# Patient Record
Sex: Female | Born: 1940 | Race: Black or African American | Hispanic: No | Smoking: Former smoker
Health system: Southern US, Community
[De-identification: ages and names within clinical notes are randomized; demographics above are authoritative.]

## PROBLEM LIST (undated history)

## (undated) DIAGNOSIS — J449 Chronic obstructive pulmonary disease, unspecified: Secondary | ICD-10-CM

## (undated) DIAGNOSIS — I639 Cerebral infarction, unspecified: Secondary | ICD-10-CM

## (undated) DIAGNOSIS — G4733 Obstructive sleep apnea (adult) (pediatric): Secondary | ICD-10-CM

## (undated) DIAGNOSIS — J9819 Other pulmonary collapse: Secondary | ICD-10-CM

## (undated) DIAGNOSIS — M1990 Unspecified osteoarthritis, unspecified site: Secondary | ICD-10-CM

## (undated) DIAGNOSIS — I4729 Other ventricular tachycardia: Secondary | ICD-10-CM

## (undated) DIAGNOSIS — E669 Obesity, unspecified: Secondary | ICD-10-CM

## (undated) DIAGNOSIS — E662 Morbid (severe) obesity with alveolar hypoventilation: Secondary | ICD-10-CM

## (undated) DIAGNOSIS — D649 Anemia, unspecified: Secondary | ICD-10-CM

## (undated) DIAGNOSIS — E785 Hyperlipidemia, unspecified: Secondary | ICD-10-CM

## (undated) DIAGNOSIS — I5042 Chronic combined systolic (congestive) and diastolic (congestive) heart failure: Secondary | ICD-10-CM

## (undated) DIAGNOSIS — J45909 Unspecified asthma, uncomplicated: Secondary | ICD-10-CM

## (undated) DIAGNOSIS — K219 Gastro-esophageal reflux disease without esophagitis: Secondary | ICD-10-CM

## (undated) DIAGNOSIS — I999 Unspecified disorder of circulatory system: Secondary | ICD-10-CM

## (undated) DIAGNOSIS — I469 Cardiac arrest, cause unspecified: Secondary | ICD-10-CM

## (undated) DIAGNOSIS — I4510 Unspecified right bundle-branch block: Secondary | ICD-10-CM

## (undated) DIAGNOSIS — D696 Thrombocytopenia, unspecified: Secondary | ICD-10-CM

## (undated) DIAGNOSIS — I472 Ventricular tachycardia: Secondary | ICD-10-CM

## (undated) DIAGNOSIS — K7460 Unspecified cirrhosis of liver: Secondary | ICD-10-CM

## (undated) DIAGNOSIS — J9620 Acute and chronic respiratory failure, unspecified whether with hypoxia or hypercapnia: Secondary | ICD-10-CM

## (undated) DIAGNOSIS — I214 Non-ST elevation (NSTEMI) myocardial infarction: Secondary | ICD-10-CM

## (undated) DIAGNOSIS — I272 Pulmonary hypertension, unspecified: Secondary | ICD-10-CM

## (undated) DIAGNOSIS — N184 Chronic kidney disease, stage 4 (severe): Secondary | ICD-10-CM

## (undated) DIAGNOSIS — I10 Essential (primary) hypertension: Secondary | ICD-10-CM

## (undated) DIAGNOSIS — M109 Gout, unspecified: Secondary | ICD-10-CM

## (undated) DIAGNOSIS — I110 Hypertensive heart disease with heart failure: Secondary | ICD-10-CM

## (undated) DIAGNOSIS — I279 Pulmonary heart disease, unspecified: Secondary | ICD-10-CM

## (undated) HISTORY — PX: ABDOMINAL HYSTERECTOMY: SHX81

## (undated) HISTORY — PX: CARDIAC CATHETERIZATION: SHX172

## (undated) HISTORY — PX: BILATERAL OOPHORECTOMY: SHX1221

## (undated) HISTORY — PX: OTHER SURGICAL HISTORY: SHX169

---

## 1999-10-06 ENCOUNTER — Ambulatory Visit (HOSPITAL_COMMUNITY): Admission: RE | Admit: 1999-10-06 | Discharge: 1999-10-06 | Payer: Self-pay | Admitting: Specialist

## 1999-10-06 ENCOUNTER — Encounter: Payer: Self-pay | Admitting: Specialist

## 1999-12-03 ENCOUNTER — Encounter: Admission: RE | Admit: 1999-12-03 | Discharge: 1999-12-03 | Payer: Self-pay | Admitting: Nephrology

## 1999-12-03 ENCOUNTER — Encounter: Payer: Self-pay | Admitting: Nephrology

## 1999-12-15 ENCOUNTER — Ambulatory Visit (HOSPITAL_COMMUNITY): Admission: RE | Admit: 1999-12-15 | Discharge: 1999-12-15 | Payer: Self-pay | Admitting: *Deleted

## 2000-01-22 ENCOUNTER — Other Ambulatory Visit: Admission: RE | Admit: 2000-01-22 | Discharge: 2000-01-22 | Payer: Self-pay | Admitting: Obstetrics and Gynecology

## 2000-02-03 ENCOUNTER — Encounter: Payer: Self-pay | Admitting: Obstetrics and Gynecology

## 2000-02-08 ENCOUNTER — Encounter: Payer: Self-pay | Admitting: Nephrology

## 2000-02-08 ENCOUNTER — Ambulatory Visit (HOSPITAL_COMMUNITY): Admission: RE | Admit: 2000-02-08 | Discharge: 2000-02-08 | Payer: Self-pay | Admitting: Nephrology

## 2002-12-29 ENCOUNTER — Emergency Department (HOSPITAL_COMMUNITY): Admission: EM | Admit: 2002-12-29 | Discharge: 2002-12-29 | Payer: Self-pay | Admitting: Emergency Medicine

## 2003-10-17 ENCOUNTER — Encounter: Admission: RE | Admit: 2003-10-17 | Discharge: 2003-10-17 | Payer: Self-pay | Admitting: Obstetrics and Gynecology

## 2004-01-20 ENCOUNTER — Encounter: Admission: RE | Admit: 2004-01-20 | Discharge: 2004-01-20 | Payer: Self-pay | Admitting: Cardiology

## 2004-02-27 ENCOUNTER — Encounter: Admission: RE | Admit: 2004-02-27 | Discharge: 2004-05-27 | Payer: Self-pay | Admitting: Cardiology

## 2004-03-16 ENCOUNTER — Encounter (INDEPENDENT_AMBULATORY_CARE_PROVIDER_SITE_OTHER): Payer: Self-pay | Admitting: *Deleted

## 2004-03-16 ENCOUNTER — Ambulatory Visit (HOSPITAL_COMMUNITY): Admission: RE | Admit: 2004-03-16 | Discharge: 2004-03-16 | Payer: Self-pay | Admitting: Obstetrics and Gynecology

## 2004-03-30 ENCOUNTER — Ambulatory Visit: Payer: Self-pay | Admitting: Family Medicine

## 2004-04-09 ENCOUNTER — Ambulatory Visit: Payer: Self-pay | Admitting: Family Medicine

## 2004-04-23 ENCOUNTER — Ambulatory Visit: Payer: Self-pay | Admitting: Cardiology

## 2005-01-11 ENCOUNTER — Ambulatory Visit: Payer: Self-pay | Admitting: Family Medicine

## 2005-01-26 ENCOUNTER — Ambulatory Visit: Payer: Self-pay | Admitting: Cardiology

## 2005-08-13 ENCOUNTER — Ambulatory Visit: Payer: Self-pay | Admitting: Family Medicine

## 2005-08-30 ENCOUNTER — Emergency Department (HOSPITAL_COMMUNITY): Admission: EM | Admit: 2005-08-30 | Discharge: 2005-08-30 | Payer: Self-pay | Admitting: Emergency Medicine

## 2005-09-10 ENCOUNTER — Emergency Department (HOSPITAL_COMMUNITY): Admission: EM | Admit: 2005-09-10 | Discharge: 2005-09-10 | Payer: Self-pay | Admitting: Family Medicine

## 2005-11-12 ENCOUNTER — Ambulatory Visit (HOSPITAL_COMMUNITY): Admission: RE | Admit: 2005-11-12 | Discharge: 2005-11-12 | Payer: Self-pay | Admitting: Ophthalmology

## 2006-01-21 ENCOUNTER — Ambulatory Visit (HOSPITAL_COMMUNITY): Admission: RE | Admit: 2006-01-21 | Discharge: 2006-01-21 | Payer: Self-pay | Admitting: Ophthalmology

## 2006-05-16 ENCOUNTER — Inpatient Hospital Stay (HOSPITAL_COMMUNITY): Admission: EM | Admit: 2006-05-16 | Discharge: 2006-05-22 | Payer: Self-pay | Admitting: Family Medicine

## 2006-08-15 ENCOUNTER — Encounter: Admission: RE | Admit: 2006-08-15 | Discharge: 2006-08-15 | Payer: Self-pay | Admitting: Nephrology

## 2006-11-21 ENCOUNTER — Ambulatory Visit (HOSPITAL_COMMUNITY): Admission: RE | Admit: 2006-11-21 | Discharge: 2006-11-21 | Payer: Self-pay | Admitting: Ophthalmology

## 2006-11-21 ENCOUNTER — Encounter (INDEPENDENT_AMBULATORY_CARE_PROVIDER_SITE_OTHER): Payer: Self-pay | Admitting: Anesthesiology

## 2007-04-14 ENCOUNTER — Ambulatory Visit (HOSPITAL_COMMUNITY): Admission: RE | Admit: 2007-04-14 | Discharge: 2007-04-14 | Payer: Self-pay | Admitting: Ophthalmology

## 2007-04-14 ENCOUNTER — Encounter (INDEPENDENT_AMBULATORY_CARE_PROVIDER_SITE_OTHER): Payer: Self-pay | Admitting: Ophthalmology

## 2008-04-17 ENCOUNTER — Ambulatory Visit (HOSPITAL_COMMUNITY): Admission: RE | Admit: 2008-04-17 | Discharge: 2008-04-17 | Payer: Self-pay | Admitting: Ophthalmology

## 2008-05-01 ENCOUNTER — Inpatient Hospital Stay (HOSPITAL_COMMUNITY): Admission: EM | Admit: 2008-05-01 | Discharge: 2008-05-05 | Payer: Self-pay | Admitting: Emergency Medicine

## 2008-05-02 ENCOUNTER — Encounter (INDEPENDENT_AMBULATORY_CARE_PROVIDER_SITE_OTHER): Payer: Self-pay | Admitting: Internal Medicine

## 2008-05-02 ENCOUNTER — Ambulatory Visit: Payer: Self-pay | Admitting: Vascular Surgery

## 2008-06-11 ENCOUNTER — Encounter: Payer: Self-pay | Admitting: Pulmonary Disease

## 2008-06-11 ENCOUNTER — Ambulatory Visit (HOSPITAL_BASED_OUTPATIENT_CLINIC_OR_DEPARTMENT_OTHER): Admission: RE | Admit: 2008-06-11 | Discharge: 2008-06-11 | Payer: Self-pay | Admitting: Pulmonary Disease

## 2008-06-13 ENCOUNTER — Ambulatory Visit: Payer: Self-pay | Admitting: Pulmonary Disease

## 2008-06-18 DIAGNOSIS — J45909 Unspecified asthma, uncomplicated: Secondary | ICD-10-CM | POA: Insufficient documentation

## 2008-06-18 DIAGNOSIS — G4733 Obstructive sleep apnea (adult) (pediatric): Secondary | ICD-10-CM | POA: Insufficient documentation

## 2008-06-18 DIAGNOSIS — J4489 Other specified chronic obstructive pulmonary disease: Secondary | ICD-10-CM | POA: Insufficient documentation

## 2008-06-18 DIAGNOSIS — Z8679 Personal history of other diseases of the circulatory system: Secondary | ICD-10-CM | POA: Insufficient documentation

## 2008-06-18 DIAGNOSIS — I110 Hypertensive heart disease with heart failure: Secondary | ICD-10-CM

## 2008-06-18 DIAGNOSIS — E8881 Metabolic syndrome: Secondary | ICD-10-CM

## 2008-06-18 DIAGNOSIS — J449 Chronic obstructive pulmonary disease, unspecified: Secondary | ICD-10-CM | POA: Insufficient documentation

## 2008-06-18 DIAGNOSIS — E1159 Type 2 diabetes mellitus with other circulatory complications: Secondary | ICD-10-CM

## 2008-06-18 DIAGNOSIS — F17200 Nicotine dependence, unspecified, uncomplicated: Secondary | ICD-10-CM

## 2008-06-18 DIAGNOSIS — E669 Obesity, unspecified: Secondary | ICD-10-CM | POA: Insufficient documentation

## 2008-06-18 DIAGNOSIS — E785 Hyperlipidemia, unspecified: Secondary | ICD-10-CM

## 2008-06-18 DIAGNOSIS — I279 Pulmonary heart disease, unspecified: Secondary | ICD-10-CM | POA: Insufficient documentation

## 2008-06-18 DIAGNOSIS — I2510 Atherosclerotic heart disease of native coronary artery without angina pectoris: Secondary | ICD-10-CM | POA: Insufficient documentation

## 2008-06-18 DIAGNOSIS — N184 Chronic kidney disease, stage 4 (severe): Secondary | ICD-10-CM | POA: Insufficient documentation

## 2008-07-15 ENCOUNTER — Encounter (INDEPENDENT_AMBULATORY_CARE_PROVIDER_SITE_OTHER): Payer: Self-pay | Admitting: *Deleted

## 2008-07-16 ENCOUNTER — Emergency Department (HOSPITAL_COMMUNITY): Admission: EM | Admit: 2008-07-16 | Discharge: 2008-07-17 | Payer: Self-pay | Admitting: Emergency Medicine

## 2008-08-07 ENCOUNTER — Inpatient Hospital Stay (HOSPITAL_COMMUNITY): Admission: EM | Admit: 2008-08-07 | Discharge: 2008-08-18 | Payer: Self-pay | Admitting: Emergency Medicine

## 2008-08-08 ENCOUNTER — Ambulatory Visit: Payer: Self-pay | Admitting: Vascular Surgery

## 2008-08-08 ENCOUNTER — Encounter (INDEPENDENT_AMBULATORY_CARE_PROVIDER_SITE_OTHER): Payer: Self-pay | Admitting: Internal Medicine

## 2010-03-16 ENCOUNTER — Encounter: Payer: Self-pay | Admitting: Endocrinology

## 2010-06-01 LAB — BLOOD GAS, ARTERIAL
Acid-Base Excess: 4.8 mmol/L — ABNORMAL HIGH (ref 0.0–2.0)
Acid-Base Excess: 6.2 mmol/L — ABNORMAL HIGH (ref 0.0–2.0)
Acid-Base Excess: 6.4 mmol/L — ABNORMAL HIGH (ref 0.0–2.0)
Bicarbonate: 32.4 mEq/L — ABNORMAL HIGH (ref 20.0–24.0)
Bicarbonate: 32.5 mEq/L — ABNORMAL HIGH (ref 20.0–24.0)
Delivery systems: POSITIVE
Expiratory PAP: 5
FIO2: 0.4 %
FIO2: 40 %
Inspiratory PAP: 15
O2 Saturation: 93.4 %
O2 Saturation: 93.8 %
O2 Saturation: 97.1 %
RATE: 8 resp/min
TCO2: 34.3 mmol/L (ref 0–100)
TCO2: 35 mmol/L (ref 0–100)
TCO2: 35 mmol/L (ref 0–100)
pCO2 arterial: 65.3 mmHg (ref 35.0–45.0)
pCO2 arterial: 73.1 mmHg (ref 35.0–45.0)
pCO2 arterial: 85.1 mmHg (ref 35.0–45.0)
pH, Arterial: 7.232 — ABNORMAL LOW (ref 7.350–7.400)
pO2, Arterial: 62.8 mmHg — ABNORMAL LOW (ref 80.0–100.0)
pO2, Arterial: 71.9 mmHg — ABNORMAL LOW (ref 80.0–100.0)
pO2, Arterial: 83 mmHg (ref 80.0–100.0)

## 2010-06-01 LAB — CLOSTRIDIUM DIFFICILE EIA: C difficile Toxins A+B, EIA: NEGATIVE

## 2010-06-01 LAB — BASIC METABOLIC PANEL
BUN: 14 mg/dL (ref 6–23)
BUN: 18 mg/dL (ref 6–23)
CO2: 28 mEq/L (ref 19–32)
CO2: 31 mEq/L (ref 19–32)
CO2: 32 mEq/L (ref 19–32)
CO2: 32 mEq/L (ref 19–32)
CO2: 33 mEq/L — ABNORMAL HIGH (ref 19–32)
CO2: 37 mEq/L — ABNORMAL HIGH (ref 19–32)
CO2: 37 mEq/L — ABNORMAL HIGH (ref 19–32)
Calcium: 7.5 mg/dL — ABNORMAL LOW (ref 8.4–10.5)
Calcium: 7.6 mg/dL — ABNORMAL LOW (ref 8.4–10.5)
Calcium: 8.6 mg/dL (ref 8.4–10.5)
Calcium: 8.7 mg/dL (ref 8.4–10.5)
Calcium: 8.7 mg/dL (ref 8.4–10.5)
Calcium: 8.7 mg/dL (ref 8.4–10.5)
Chloride: 100 mEq/L (ref 96–112)
Chloride: 104 mEq/L (ref 96–112)
Chloride: 98 mEq/L (ref 96–112)
Chloride: 99 mEq/L (ref 96–112)
Chloride: 99 mEq/L (ref 96–112)
Creatinine, Ser: 1.16 mg/dL (ref 0.4–1.2)
Creatinine, Ser: 1.41 mg/dL — ABNORMAL HIGH (ref 0.4–1.2)
Creatinine, Ser: 1.54 mg/dL — ABNORMAL HIGH (ref 0.4–1.2)
Creatinine, Ser: 1.56 mg/dL — ABNORMAL HIGH (ref 0.4–1.2)
Creatinine, Ser: 1.76 mg/dL — ABNORMAL HIGH (ref 0.4–1.2)
Creatinine, Ser: 1.93 mg/dL — ABNORMAL HIGH (ref 0.4–1.2)
GFR calc Af Amer: 31 mL/min — ABNORMAL LOW (ref >60)
GFR calc Af Amer: 35 mL/min — ABNORMAL LOW (ref >60)
GFR calc Af Amer: 38 mL/min — ABNORMAL LOW (ref >60)
GFR calc Af Amer: 40 mL/min — ABNORMAL LOW (ref >60)
GFR calc Af Amer: 50 mL/min — ABNORMAL LOW (ref >60)
GFR calc Af Amer: 56 mL/min — ABNORMAL LOW (ref >60)
GFR calc non Af Amer: 26 mL/min — ABNORMAL LOW (ref >60)
GFR calc non Af Amer: 29 mL/min — ABNORMAL LOW (ref >60)
GFR calc non Af Amer: 43 mL/min — ABNORMAL LOW (ref >60)
Glucose, Bld: 102 mg/dL — ABNORMAL HIGH (ref 70–99)
Glucose, Bld: 104 mg/dL — ABNORMAL HIGH (ref 70–99)
Glucose, Bld: 113 mg/dL — ABNORMAL HIGH (ref 70–99)
Glucose, Bld: 118 mg/dL — ABNORMAL HIGH (ref 70–99)
Glucose, Bld: 123 mg/dL — ABNORMAL HIGH (ref 70–99)
Glucose, Bld: 87 mg/dL (ref 70–99)
Potassium: 4.9 mEq/L (ref 3.5–5.1)
Potassium: 5.1 mEq/L (ref 3.5–5.1)
Potassium: 5.3 mEq/L — ABNORMAL HIGH (ref 3.5–5.1)
Potassium: 6.1 mEq/L — ABNORMAL HIGH (ref 3.5–5.1)
Sodium: 135 mEq/L (ref 135–145)
Sodium: 135 mEq/L (ref 135–145)
Sodium: 140 mEq/L (ref 135–145)
Sodium: 141 mEq/L (ref 135–145)
Sodium: 141 mEq/L (ref 135–145)

## 2010-06-01 LAB — HEPATIC FUNCTION PANEL
ALT: 11 U/L (ref 0–35)
AST: 13 U/L (ref 0–37)
Albumin: 2.3 g/dL — ABNORMAL LOW (ref 3.5–5.2)
Alkaline Phosphatase: 76 U/L (ref 39–117)
Bilirubin, Direct: 0.3 mg/dL (ref 0.0–0.3)
Total Bilirubin: 0.7 mg/dL (ref 0.3–1.2)
Total Bilirubin: 0.9 mg/dL (ref 0.3–1.2)

## 2010-06-01 LAB — COMPREHENSIVE METABOLIC PANEL
AST: 13 U/L (ref 0–37)
Albumin: 2.4 g/dL — ABNORMAL LOW (ref 3.5–5.2)
Alkaline Phosphatase: 65 U/L (ref 39–117)
Chloride: 103 mEq/L (ref 96–112)
GFR calc Af Amer: 35 mL/min — ABNORMAL LOW (ref >60)
Potassium: 3.4 mEq/L — ABNORMAL LOW (ref 3.5–5.1)
Sodium: 142 mEq/L (ref 135–145)
Total Bilirubin: 0.7 mg/dL (ref 0.3–1.2)

## 2010-06-01 LAB — CBC
HCT: 37.3 % (ref 36.0–46.0)
HCT: 39.6 % (ref 36.0–46.0)
HCT: 43.3 % (ref 36.0–46.0)
HCT: 44.5 % (ref 36.0–46.0)
HCT: 54 % — ABNORMAL HIGH (ref 36.0–46.0)
Hemoglobin: 10.8 g/dL — ABNORMAL LOW (ref 12.0–15.0)
Hemoglobin: 11.5 g/dL — ABNORMAL LOW (ref 12.0–15.0)
Hemoglobin: 11.5 g/dL — ABNORMAL LOW (ref 12.0–15.0)
Hemoglobin: 12.1 g/dL (ref 12.0–15.0)
Hemoglobin: 12.4 g/dL (ref 12.0–15.0)
Hemoglobin: 12.9 g/dL (ref 12.0–15.0)
Hemoglobin: 13 g/dL (ref 12.0–15.0)
Hemoglobin: 15.8 g/dL — ABNORMAL HIGH (ref 12.0–15.0)
MCHC: 28.3 g/dL — ABNORMAL LOW (ref 30.0–36.0)
MCHC: 29.1 g/dL — ABNORMAL LOW (ref 30.0–36.0)
MCHC: 29.3 g/dL — ABNORMAL LOW (ref 30.0–36.0)
MCHC: 29.6 g/dL — ABNORMAL LOW (ref 30.0–36.0)
MCV: 75.6 fL — ABNORMAL LOW (ref 78.0–100.0)
MCV: 75.7 fL — ABNORMAL LOW (ref 78.0–100.0)
MCV: 75.7 fL — ABNORMAL LOW (ref 78.0–100.0)
MCV: 76.6 fL — ABNORMAL LOW (ref 78.0–100.0)
Platelets: 164 10*3/uL (ref 150–400)
Platelets: 206 10*3/uL (ref 150–400)
Platelets: 215 10*3/uL (ref 150–400)
Platelets: 270 10*3/uL (ref 150–400)
RBC: 4.94 MIL/uL (ref 3.87–5.11)
RBC: 5.49 MIL/uL — ABNORMAL HIGH (ref 3.87–5.11)
RBC: 5.73 MIL/uL — ABNORMAL HIGH (ref 3.87–5.11)
RBC: 5.8 MIL/uL — ABNORMAL HIGH (ref 3.87–5.11)
RBC: 5.92 MIL/uL — ABNORMAL HIGH (ref 3.87–5.11)
RDW: 27 % — ABNORMAL HIGH (ref 11.5–15.5)
RDW: 27.7 % — ABNORMAL HIGH (ref 11.5–15.5)
RDW: 28 % — ABNORMAL HIGH (ref 11.5–15.5)
RDW: 28 % — ABNORMAL HIGH (ref 11.5–15.5)
RDW: 28.7 % — ABNORMAL HIGH (ref 11.5–15.5)
WBC: 11.2 10*3/uL — ABNORMAL HIGH (ref 4.0–10.5)
WBC: 11.4 10*3/uL — ABNORMAL HIGH (ref 4.0–10.5)
WBC: 8.8 10*3/uL (ref 4.0–10.5)
WBC: 8.8 10*3/uL (ref 4.0–10.5)
WBC: 9.4 10*3/uL (ref 4.0–10.5)

## 2010-06-01 LAB — URINE MICROSCOPIC-ADD ON

## 2010-06-01 LAB — POCT I-STAT 3, ART BLOOD GAS (G3+)
Acid-Base Excess: 6 mmol/L — ABNORMAL HIGH (ref 0.0–2.0)
Bicarbonate: 32.3 mEq/L — ABNORMAL HIGH (ref 20.0–24.0)
TCO2: 34 mmol/L (ref 0–100)
pO2, Arterial: 76 mmHg — ABNORMAL LOW (ref 80.0–100.0)

## 2010-06-01 LAB — CARDIAC PANEL(CRET KIN+CKTOT+MB+TROPI)
CK, MB: 1.6 ng/mL (ref 0.3–4.0)
Relative Index: INVALID (ref 0.0–2.5)
Relative Index: INVALID (ref 0.0–2.5)
Total CK: 36 U/L (ref 7–177)
Total CK: 58 U/L (ref 7–177)
Troponin I: 0.05 ng/mL (ref 0.00–0.06)
Troponin I: 0.07 ng/mL — ABNORMAL HIGH (ref 0.00–0.06)

## 2010-06-01 LAB — LIPID PANEL
Cholesterol: 151 mg/dL (ref 0–200)
Total CHOL/HDL Ratio: 4.4 RATIO

## 2010-06-01 LAB — URINALYSIS, ROUTINE W REFLEX MICROSCOPIC
Glucose, UA: NEGATIVE mg/dL
Ketones, ur: 15 mg/dL — AB
pH: 6 (ref 5.0–8.0)

## 2010-06-01 LAB — GLUCOSE, CAPILLARY
Glucose-Capillary: 101 mg/dL — ABNORMAL HIGH (ref 70–99)
Glucose-Capillary: 107 mg/dL — ABNORMAL HIGH (ref 70–99)
Glucose-Capillary: 109 mg/dL — ABNORMAL HIGH (ref 70–99)
Glucose-Capillary: 109 mg/dL — ABNORMAL HIGH (ref 70–99)
Glucose-Capillary: 109 mg/dL — ABNORMAL HIGH (ref 70–99)
Glucose-Capillary: 109 mg/dL — ABNORMAL HIGH (ref 70–99)
Glucose-Capillary: 115 mg/dL — ABNORMAL HIGH (ref 70–99)
Glucose-Capillary: 129 mg/dL — ABNORMAL HIGH (ref 70–99)
Glucose-Capillary: 132 mg/dL — ABNORMAL HIGH (ref 70–99)
Glucose-Capillary: 133 mg/dL — ABNORMAL HIGH (ref 70–99)
Glucose-Capillary: 142 mg/dL — ABNORMAL HIGH (ref 70–99)
Glucose-Capillary: 144 mg/dL — ABNORMAL HIGH (ref 70–99)
Glucose-Capillary: 145 mg/dL — ABNORMAL HIGH (ref 70–99)
Glucose-Capillary: 147 mg/dL — ABNORMAL HIGH (ref 70–99)
Glucose-Capillary: 159 mg/dL — ABNORMAL HIGH (ref 70–99)
Glucose-Capillary: 160 mg/dL — ABNORMAL HIGH (ref 70–99)
Glucose-Capillary: 178 mg/dL — ABNORMAL HIGH (ref 70–99)
Glucose-Capillary: 21 mg/dL — CL (ref 70–99)
Glucose-Capillary: 29 mg/dL — CL (ref 70–99)
Glucose-Capillary: 71 mg/dL (ref 70–99)
Glucose-Capillary: 80 mg/dL (ref 70–99)
Glucose-Capillary: 84 mg/dL (ref 70–99)
Glucose-Capillary: 85 mg/dL (ref 70–99)
Glucose-Capillary: 86 mg/dL (ref 70–99)
Glucose-Capillary: 89 mg/dL (ref 70–99)
Glucose-Capillary: 90 mg/dL (ref 70–99)
Glucose-Capillary: 91 mg/dL (ref 70–99)
Glucose-Capillary: 93 mg/dL (ref 70–99)
Glucose-Capillary: 97 mg/dL (ref 70–99)
Glucose-Capillary: 97 mg/dL (ref 70–99)
Glucose-Capillary: <10 mg/dL — CL (ref 70–99)

## 2010-06-01 LAB — PROTIME-INR: Prothrombin Time: 13.9 seconds (ref 11.6–15.2)

## 2010-06-01 LAB — CULTURE, BLOOD (ROUTINE X 2): Culture: NO GROWTH

## 2010-06-01 LAB — CK TOTAL AND CKMB (NOT AT ARMC)
CK, MB: 2.9 ng/mL (ref 0.3–4.0)
Relative Index: INVALID (ref 0.0–2.5)

## 2010-06-01 LAB — DIFFERENTIAL
Basophils Absolute: 0 10*3/uL (ref 0.0–0.1)
Eosinophils Absolute: 0 10*3/uL (ref 0.0–0.7)
Eosinophils Relative: 0 % (ref 0–5)
Lymphocytes Relative: 9 % — ABNORMAL LOW (ref 12–46)
Lymphs Abs: 1 10*3/uL (ref 0.7–4.0)
Monocytes Relative: 5 % (ref 3–12)
Neutrophils Relative %: 86 % — ABNORMAL HIGH (ref 43–77)

## 2010-06-01 LAB — BRAIN NATRIURETIC PEPTIDE: Pro B Natriuretic peptide (BNP): 737 pg/mL — ABNORMAL HIGH (ref 0.0–100.0)

## 2010-06-01 LAB — MAGNESIUM
Magnesium: 1.3 mg/dL — ABNORMAL LOW (ref 1.5–2.5)
Magnesium: 1.8 mg/dL (ref 1.5–2.5)

## 2010-06-01 LAB — POCT I-STAT, CHEM 8
Creatinine, Ser: 1.3 mg/dL — ABNORMAL HIGH (ref 0.4–1.2)
HCT: 62 % — ABNORMAL HIGH (ref 36.0–46.0)
Hemoglobin: 21.1 g/dL — ABNORMAL HIGH (ref 12.0–15.0)
Sodium: 139 mEq/L (ref 135–145)
TCO2: 34 mmol/L (ref 0–100)

## 2010-06-01 LAB — POCT CARDIAC MARKERS

## 2010-06-01 LAB — HEMOGLOBIN A1C

## 2010-06-02 LAB — DIFFERENTIAL
Basophils Relative: 0 % (ref 0–1)
Eosinophils Absolute: 0.1 10*3/uL (ref 0.0–0.7)
Eosinophils Relative: 1 % (ref 0–5)
Lymphocytes Relative: 21 % (ref 12–46)
Monocytes Relative: 7 % (ref 3–12)
Neutro Abs: 4.9 10*3/uL (ref 1.7–7.7)
Neutrophils Relative %: 71 % (ref 43–77)

## 2010-06-02 LAB — POCT CARDIAC MARKERS
CKMB, poc: <1 ng/mL — ABNORMAL LOW (ref 1.0–8.0)
Myoglobin, poc: 138 ng/mL (ref 12–200)

## 2010-06-02 LAB — CBC
HCT: 42.9 % (ref 36.0–46.0)
MCV: 77.3 fL — ABNORMAL LOW (ref 78.0–100.0)
Platelets: 246 10*3/uL (ref 150–400)
RDW: 26.3 % — ABNORMAL HIGH (ref 11.5–15.5)
WBC: 7 10*3/uL (ref 4.0–10.5)

## 2010-06-02 LAB — COMPREHENSIVE METABOLIC PANEL
AST: 15 U/L (ref 0–37)
Albumin: 2.5 g/dL — ABNORMAL LOW (ref 3.5–5.2)
BUN: 11 mg/dL (ref 6–23)
Calcium: 8.5 mg/dL (ref 8.4–10.5)
Creatinine, Ser: 1.47 mg/dL — ABNORMAL HIGH (ref 0.4–1.2)
GFR calc Af Amer: 43 mL/min — ABNORMAL LOW (ref >60)
Total Bilirubin: 0.8 mg/dL (ref 0.3–1.2)
Total Protein: 5.8 g/dL — ABNORMAL LOW (ref 6.0–8.3)

## 2010-06-04 LAB — POCT I-STAT, CHEM 8
Calcium, Ion: 1.18 mmol/L (ref 1.12–1.32)
Creatinine, Ser: 1.9 mg/dL — ABNORMAL HIGH (ref 0.4–1.2)
Glucose, Bld: 100 mg/dL — ABNORMAL HIGH (ref 70–99)
Hemoglobin: 17 g/dL — ABNORMAL HIGH (ref 12.0–15.0)
Potassium: 4.8 mEq/L (ref 3.5–5.1)

## 2010-06-04 LAB — GLUCOSE, CAPILLARY
Glucose-Capillary: 119 mg/dL — ABNORMAL HIGH (ref 70–99)
Glucose-Capillary: 127 mg/dL — ABNORMAL HIGH (ref 70–99)
Glucose-Capillary: 136 mg/dL — ABNORMAL HIGH (ref 70–99)
Glucose-Capillary: 162 mg/dL — ABNORMAL HIGH (ref 70–99)
Glucose-Capillary: 80 mg/dL (ref 70–99)

## 2010-06-04 LAB — BASIC METABOLIC PANEL
BUN: 27 mg/dL — ABNORMAL HIGH (ref 6–23)
BUN: 27 mg/dL — ABNORMAL HIGH (ref 6–23)
CO2: 27 mEq/L (ref 19–32)
CO2: 32 mEq/L (ref 19–32)
Calcium: 9 mg/dL (ref 8.4–10.5)
Calcium: 9.1 mg/dL (ref 8.4–10.5)
Calcium: 9.4 mg/dL (ref 8.4–10.5)
Calcium: 9.5 mg/dL (ref 8.4–10.5)
Creatinine, Ser: 1.46 mg/dL — ABNORMAL HIGH (ref 0.4–1.2)
Creatinine, Ser: 1.78 mg/dL — ABNORMAL HIGH (ref 0.4–1.2)
GFR calc Af Amer: 34 mL/min — ABNORMAL LOW (ref >60)
GFR calc Af Amer: 51 mL/min — ABNORMAL LOW (ref >60)
GFR calc non Af Amer: 28 mL/min — ABNORMAL LOW (ref >60)
GFR calc non Af Amer: 36 mL/min — ABNORMAL LOW (ref >60)
GFR calc non Af Amer: 36 mL/min — ABNORMAL LOW (ref >60)
GFR calc non Af Amer: 42 mL/min — ABNORMAL LOW (ref >60)
Glucose, Bld: 100 mg/dL — ABNORMAL HIGH (ref 70–99)
Glucose, Bld: 81 mg/dL (ref 70–99)
Potassium: 5.1 mEq/L (ref 3.5–5.1)
Sodium: 138 mEq/L (ref 135–145)
Sodium: 138 mEq/L (ref 135–145)

## 2010-06-04 LAB — PHOSPHORUS: Phosphorus: 5 mg/dL — ABNORMAL HIGH (ref 2.3–4.6)

## 2010-06-04 LAB — DIFFERENTIAL
Basophils Absolute: 0 10*3/uL (ref 0.0–0.1)
Basophils Relative: 0 % (ref 0–1)
Eosinophils Absolute: 0 10*3/uL (ref 0.0–0.7)
Eosinophils Relative: 0 % (ref 0–5)
Lymphs Abs: 1.6 10*3/uL (ref 0.7–4.0)
Neutrophils Relative %: 77 % (ref 43–77)

## 2010-06-04 LAB — TROPONIN I: Troponin I: <0.01 ng/mL (ref 0.00–0.06)

## 2010-06-04 LAB — BLOOD GAS, ARTERIAL
Bicarbonate: 28.1 mEq/L — ABNORMAL HIGH (ref 20.0–24.0)
FIO2: 0.21 %
O2 Content: 2.5 L/min
O2 Saturation: 92.1 %
Patient temperature: 98.6
TCO2: 29.8 mmol/L (ref 0–100)
pCO2 arterial: 54.9 mmHg — ABNORMAL HIGH (ref 35.0–45.0)
pH, Arterial: 7.245 — ABNORMAL LOW (ref 7.350–7.400)
pH, Arterial: 7.329 — ABNORMAL LOW (ref 7.350–7.400)
pO2, Arterial: 55.3 mmHg — ABNORMAL LOW (ref 80.0–100.0)

## 2010-06-04 LAB — CBC
HCT: 41.7 % (ref 36.0–46.0)
HCT: 45 % (ref 36.0–46.0)
HCT: 45.5 % (ref 36.0–46.0)
Hemoglobin: 13.6 g/dL (ref 12.0–15.0)
Hemoglobin: 14.5 g/dL (ref 12.0–15.0)
MCHC: 32.7 g/dL (ref 30.0–36.0)
Platelets: 193 10*3/uL (ref 150–400)
Platelets: 210 10*3/uL (ref 150–400)
RDW: 19.6 % — ABNORMAL HIGH (ref 11.5–15.5)
RDW: 19.6 % — ABNORMAL HIGH (ref 11.5–15.5)
WBC: 10.3 10*3/uL (ref 4.0–10.5)
WBC: 9.5 10*3/uL (ref 4.0–10.5)

## 2010-06-04 LAB — CARDIAC PANEL(CRET KIN+CKTOT+MB+TROPI)
CK, MB: 3.3 ng/mL (ref 0.3–4.0)
Total CK: 203 U/L — ABNORMAL HIGH (ref 7–177)

## 2010-06-04 LAB — POCT CARDIAC MARKERS
CKMB, poc: 1.4 ng/mL (ref 1.0–8.0)
CKMB, poc: 1.8 ng/mL (ref 1.0–8.0)
Troponin i, poc: <0.05 ng/mL (ref 0.00–0.09)
Troponin i, poc: <0.05 ng/mL (ref 0.00–0.09)

## 2010-06-04 LAB — LIPID PANEL
LDL Cholesterol: 110 mg/dL — ABNORMAL HIGH (ref 0–99)
Total CHOL/HDL Ratio: 6.2 RATIO
Triglycerides: 148 mg/dL (ref <150)
VLDL: 30 mg/dL (ref 0–40)

## 2010-06-04 LAB — TSH: TSH: 1.505 u[IU]/mL (ref 0.350–4.500)

## 2010-06-04 LAB — CK TOTAL AND CKMB (NOT AT ARMC)
CK, MB: 2.9 ng/mL (ref 0.3–4.0)
CK, MB: 3.1 ng/mL (ref 0.3–4.0)
Relative Index: 1.6 (ref 0.0–2.5)
Relative Index: 1.7 (ref 0.0–2.5)
Total CK: 184 U/L — ABNORMAL HIGH (ref 7–177)

## 2010-06-04 LAB — APTT: aPTT: 29 seconds (ref 24–37)

## 2010-06-04 LAB — HEMOGLOBIN A1C

## 2010-06-04 LAB — PROTIME-INR: Prothrombin Time: 12.8 seconds (ref 11.6–15.2)

## 2010-06-04 LAB — HEPATIC FUNCTION PANEL
Bilirubin, Direct: 0.1 mg/dL (ref 0.0–0.3)
Indirect Bilirubin: 0.3 mg/dL (ref 0.3–0.9)
Total Protein: 6.2 g/dL (ref 6.0–8.3)

## 2010-06-04 LAB — D-DIMER, QUANTITATIVE: D-Dimer, Quant: 0.89 ug/mL-FEU — ABNORMAL HIGH (ref 0.00–0.48)

## 2010-07-07 NOTE — Discharge Summary (Signed)
NAMEMEIGAN, PATES NO.:  [id number]   MEDICAL RECORD NO.:  [id number]          PATIENT TYPE:  INP   LOCATION:  5528                         FACILITY:  [facility]   PHYSICIAN:  Beckey Rutter, MD  DATE OF BIRTH:  [birth year]   DATE OF ADMISSION:  08/07/2008  DATE OF DISCHARGE:  08/18/2008                               DISCHARGE SUMMARY   DISCHARGE DIAGNOSIS:  Please refer to the previously dictated discharge  summary for discharge diagnosis dictated under actual diagnosis.   DISCHARGE MEDICATIONS:  1. Albuterol MDI.  2. Allopurinol 100 mg p.o. daily.  3. Aspirin 325 mg.  4. Os-Cal 1 tablet b.i.d.  5. Tiazac 180 mg p.o. daily.  6. Lasix 40 mg p.o. daily.  7. Isosorbide mononitrate 30 mg p.o. daily.  8. Labetalol 200 mg p.o. b.i.d.  9. Zocor 20 mg p.o. nightly.  10.CPAP at night or during sleep.  11.K-Dur 10 mEq p.o. daily.  12.NovoLog insulin sliding scale 3 times before meals without meal      coverage with a sensitive scale.  13.Actos 30 mg p.o. daily.   DISCHARGE PLAN:  The patient is approaching stability at this time.  She  will be discharged home with home health PT and visiting nurse.  The  patient should follow up with her primary physician as discussed with  her.  The discharge plan and follow ups is also discussed with the  daughter.  The patient provided the number for Dr. Vassie Loll with [facility] and she should follow up with Dr. Allyne Gee as discussed to her.  The patient will wait for home health PT and RN set up.  She is  approaching stability and she will be discharged on August 19, 2008.      Beckey Rutter, MD  Electronically Signed     EME/MEDQ  D:  08/18/2008  T:  08/19/2008  Job:  [id number]

## 2010-07-07 NOTE — Group Therapy Note (Signed)
NAMECRISTOL, [name] NO.:  [id number]   MEDICAL RECORD NO.:  [id number]          PATIENT TYPE:  INP   LOCATION:  3103                         FACILITY:  [facility]   PHYSICIAN:  Lonia Blood, M.D.DATE OF BIRTH:  02-26-1940                                 PROGRESS NOTE   PRIMARY CARE PHYSICIAN:  Dr. Candyce Churn. Sander.   ACTIVE DIAGNOSES:  1. Severe idiopathic refractory hypoglycemia.  2. Acute on chronic respiratory failure secondary to sedation -      presently requiring bilevel positive airway pressure.  3. Obesity hypoventilation syndrome/sleep apnea - poorly compliant      with the continuous positive airway pressure at home.  4. Chronic peripheral edema secondary to diastolic heart failure, cor      pulmonale and low albumin/low oncotic pressure.  5. Pulmonary hypertension.  6. Obesity hypoventilation/sleep apnea.  7. Morbid obesity.  8. Hypertension.  9. Stage III chronic kidney disease - baseline creatinine      approximately 1.5-1.8.  10.Ongoing tobacco abuse.  11.Hyperlipidemia.  12.Status post bilateral oophorectomies.  13.Diarrhea with abdominal pain and cramps - resolved.   DISCHARGE MEDICATIONS:  To be determined at the actual time of discharge.   PROCEDURES:  1. CT angio of the chest on August 07, 2008 - no evidence for acute      pulmonary emboli.  Cardiomegaly is evident.  2. CT scan of the abdomen and pelvis on August 07, 2008 - pelvic ascites      with flank edema.  No focal extraluminal fluid collections are      appreciated.  No evidence of ureteral calculi or hydronephrosis.      No significant change in bilateral renal cysts and bilateral      adrenal adenomas.   HOSPITAL COURSE:  [name] is a very pleasant 70 year old morbidly obese  female with severe pulmonary hypertension, obesity, hypoventilation  syndrome who also smokes and continues to do so.  She was admitted to  the hospital originally on August 07, 2008, with  complaints of severe  abdominal pain, cramps, nausea, vomiting and diarrhea.  Additionally,  she was short of breath, but stated that this was essentially at her  baseline.  CT scan of the abdomen and pelvis was carried out.  This was  unrevealing.  It was appreciated that the patient had recently treated  with a course of doxycycline.  Originally, there was concern that the  patient could have developed C diff because of this antibiotic.  The  nature of the stools, however, was not consistent with C. diff.  C. diff  was stent for a C. diff toxin assay and this was negative.  The  patient's symptoms quickly resolved with simple time.  It is felt that  the patient's diarrhea was likely simply doxycycline associated as this  is a common side effect of this medication.  At this point, the diarrhea  has completely resolved and the abdominal cramps have resolved as well.   At the time of her presentation, the patient also complained of  shortness of breath.  It was appreciated that she has  severe obesity  hypoventilation syndrome with sleep apnea and it was reported that she  is poorly compliant with her home CPAP.  Because of her morbid obesity,  there was concern for possibility of pulmonary embolism.  D-dimer was  obtained and was in fact elevated.  CT angio of the chest was carried  out and there was no evidence of a pulmonary embolism.  With ongoing  assessment it actually appeared that the patient was at her baseline  respiratory status wise.  She does suffer with some pulmonary edema and  also has significant peripheral edema.  This is a multifactorial edema.  Attempts to diurese met with rapid elevation of the patient's creatinine  and, therefore, these had to be moderated.   On the night of August 11, 2008, and the early morning of hours of August 12 1008, the patient suffered an episode of severe hypoglycemia.  She was  found unresponsive in her room by her nurse.  CBGs were recorded  as low  as 22.  With this, the patient's respiratory status declined as well as  she was hypoventilating.  The patient required BiPAP and transferred to  the intensive care unit.  At the present time, the patient remains in  the intensive care unit on BiPAP.  She is awake and alert.  CBGs remain  labile and the patient is requiring dextrose IV fluids for support.  The  exact cause of the patient's acute hypoglycemic spell is not clear.  The  patient was previously treated on what was reported to be her home  insulin regimen, as well as Actos.  For now, Lantus has been  discontinued as has Actos.  We will attempt to liberate her from the  dextrose fluid as soon as CBGs are more stable.   At the present time, the exact date of discharge cannot be determined.  It is clear, however, based upon recent echocardiogram from March 2010,  revealing pulmonary hypertension, the patient will require home O2  therapy at all times at the time of her discharge.      Lonia Blood, M.D.  Electronically Signed     JTM/MEDQ  D:  08/12/2008  T:  08/12/2008  Job:  [id number]   cc:   Candyce Churn. Allyne Gee, M.D.  Fax: [phone]

## 2010-07-07 NOTE — H&P (Signed)
[name], MOMON NO.:  [id number]   MEDICAL RECORD NO.:  [id number]          PATIENT TYPE:  INP   LOCATION:  1827                         FACILITY:  [facility]   PHYSICIAN:  Eduard Clos, MDDATE OF BIRTH:  [birth year]   DATE OF ADMISSION:  08/07/2008  DATE OF DISCHARGE:                              HISTORY & PHYSICAL   PRIMARY CARE PHYSICIAN:  Dr. Dorothyann Peng.   PRIMARY PULMONOLOGIST:  Dr. Vassie Loll.   CHIEF COMPLAINT:  Nausea and vomiting, abdominal pain, shortness of  breath.   HISTORY OF PRESENT ILLNESS:  A 70 year old female with morbid obesity,  diabetes mellitus type 2, hypertension, obstructive sleep apnea on CPAP,  presented with complaints of persistent abdominal pain, nausea and  vomiting, diarrhea, shortness of breath.  The patient has been having  these symptoms over the last 3 days, which has become persistent.  The  patient's shortness of breath is presenting at rest, though not in acute  distress.  The patient's abdominal pain started periumbilical and is  constant, nonradiating, with persistent nausea and vomiting over night,  which prompted her to come to the ER.  The patient also has been having  increasing episodes of diarrhea.   The patient denies taking any antibiotics, though her pharmacy reports  she has been on Doxycycline last week.  The patient states the pain is  dull, aching, periumbilical, nonradiating.  Has no relief with any bowel  movements and has not decreased with food.  The patient denies any chest  pain, palpitations.  Does have fatigue.  Denies any dizziness, loss of  consciousness, focal deficits.  The patient also has been noticing  increasing swelling of the lower extremities, which she states is  chronic, but over the last few days has become more tender.   The patient states she usually is on spironolactone and Lasix, which has  been discontinued by her primary care physician_.  The patient has been  admitted  for further evaluation and treatment.  In the ER, the patient  had a chest x-ray, which revealed a possibility of pneumonia.   PAST MEDICAL HISTORY:  Diabetes mellitus type 2, hypertension,  obstructive sleep apnea on CPAP, morbid obesity, hyperlipidemia.   PAST SURGICAL HISTORY:  Bilateral oophorectomy.   MEDICATIONS PRIOR TO ADMISSION:  1. After verifying with the family and the pharmacy, the patient is on      Lantus insulin 47 units subcutaneous at bedtime.  2. Amlodipine 5 mg p.o. daily.  3. Labetalol 200 mg p.o. b.i.d.  4. Diltiazem XR 180 mg p.o. daily.  5. Albuterol HFA q.6 hours p.r.n.  6. NovoLog sliding scale.  7. Aspirin 81 mg p.o. daily.  8. Zocor 20 mg p.o. daily.  9. Actos 30 mg p.o. daily.  10.Imdur 30 mg p.o. daily.  11.Allopurinol 100 mg p.o. daily.   ALLERGIES:  Sulfa.   FAMILY HISTORY:  Mother died at age 58 from CVA.  Father died in early  66s from MI.   SOCIAL HISTORY:  The patient is a widow.  Lives with her daughter.  Denies drinking alcohol, but does smoke cigarettes.  Strongly advised to  quit smoking.  Denies any drug abuse.   REVIEW OF SYSTEMS:  As per history of present illness.  Nothing else  significant.   PHYSICAL EXAMINATION:  CONSTITUTIONAL:  The patient examined at bedside,  not in acute distress.  VITAL SIGNS:  Blood pressure is 150/100, pulse 97 per minute,  temperature 97.2, respirations 18 per minute, O2 saturation 100% on room  air.  HEENT:  Anicteric, no pallor.  CHEST:  Bilateral air entry present.  No rhonchi, no crepitus.  HEART:  S1, S2 heard.  ABDOMEN:  Soft.  Nontender.  No guarding or rigidity.  Bowel sounds  heard.  No discoloration seen.  NEUROLOGIC:  Alert and oriented to time, place, and person.  Moves upper  and lower extremities.  EXTREMITIES:  There is 2+ edema bilaterally.  Peripheral pulses felt.   LABORATORY STUDIES:  Chest x-ray shows right base medial atelectasis or  infiltrate.  No pulmonary edema.   Cardiomegaly again noted.  CBC, WBC is  10.9, hemoglobin 21.1, hematocrit 62, platelets 70,000, neutrophils 86%.  PT/INR 13.9 and 1.  Basic metabolic panel sodium 139, potassium 3.8,  chloride 98, glucose 84.  BUN 18, creatinine 1.3, CK-MB 1.1, troponin I  less than 0.05.  BNP 737.  Urine cultures pending.   ASSESSMENT:  1. Shortness of breath.  Probably multifactorial.      a.     Diastolic heart failure from fluid overload.      b.     Possible pneumonia.  2. Nausea and vomiting, abdominal pain, and diarrhea.  Possibly      clostridium difficile colitis versus gastroenteritis.  3. Possible secondary polycythemia from chronic hypoxia.  4. Bilateral lower extremity edema, again multifactorial, probably      from medications like Norvasc, Actos, and discontinuing her Lasix.  5. Diabetes mellitus x1.  6. Hypertension.  7. Morbid obesity.  8. Obstructive sleep apnea.   PLAN:  1. Will admit the patient to telemetry, cycle cardiac enzymes, check a      D-dimer.  If D-dimer is positive, will pursue PE studies.  However,      will be having Doppler of the lower extremity to rule out DVT.  2. For possible pneumonia, place the patient on Avelox and the patient      is having diarrhea with abdominal pain and recent antibiotics use.      Will place the patient on Flagyl with stool studies.  3. Will get a CT of abdomen and pelvis without contrast to make sure      there is no acute pathology.  4. For polycythemia, probably from chronic hypoxia, will check      erythropoietin level.  If level is less, then we have to pursue      primary causes and we have to follow her CBC very closely.  5. Will resume most of her home medications.  6. For her edema, will start IV Lasix closely follow electrolytes.      The patient has only had a 2D echo in May 02, 2008 which showed      EF of 60%-65%.  Will get an EKG now to see if there are any acute      findings.  Further examination as condition  evolves.      Eduard Clos, MD  Electronically Signed     ANK/MEDQ  D:  08/07/2008  T:  08/07/2008  Job:  [id number]   cc:   Candyce Churn. Allyne Gee,  M.D. 

## 2010-07-07 NOTE — Procedures (Signed)
[name], [name] NO.:  [id number]   MEDICAL RECORD NO.:  [id number]          PATIENT TYPE:  OUT   LOCATION:  SLEEP CENTER                 [facility]   PHYSICIAN:  Oretha Milch, MD      DATE OF BIRTH:  [birth year]   DATE OF STUDY:  06/11/2008                            NOCTURNAL POLYSOMNOGRAM   REFERRING PHYSICIAN:   Ms. [name] is a morbidly obese woman who was admitted to [facility]  for hypercarbic respiratory failure and discharge, and CPAP was found to  have witnessed apneas with loud snoring, excessive daytime somnolence,  frequent napping, and hypertension.  Her weight was 255 pounds, height 5  feet 4 inches, BMI 44, neck size 16.5 inches, Epworth sleepiness score  was 24.   This overnight polysomnogram was performed with a sleep technologist in  attendance.  EEG, EOG, EMG, EKG, and respiratory parameters were  recorded.  Sleep stages arousal, limb movements, and respiratory data  were scored according to criteria laid out by the American Academy of  Sleep Medicine.   SLEEP ARCHITECTURE:  Lights out was at 10:58 p.m.  Lights on was at 5:31  a.m.  During the diagnostic portion, total sleep time was 127 minutes  with a sleep period time of 162 minutes and the sleep efficiency of 69%.  Sleep stages with the percentage of total sleep time was N1 27% and N2  73%.  No slow wave or REM sleep was noted.  She spent 78 minutes in the  supine position.  During the titration portion, 88 minutes of REM sleep  was noted and too large.  Around 3:00 a.m. and 4:30 a.m., supine sleep  was noted for 190 minutes.   RESPIRATORY DATA:  During the diagnostic portion, there were 42  obstructive apneas and 129 hypopneas with an AHI of 81 events per hour,  25 RERAs were also noted.  The longest apnea was 26 seconds and then the  longest hypopnea was 47 seconds.  Due to this degree of respiratory  disturbance, CPAP was initiated at 6 cm with a full-face mask.  This  was  titrated to 19 cm due to respiratory events and snoring.  She was placed  on 2 L of oxygen at the start of the study, which was increased to 3 L  during the study.  At a level of 14 cm for 15 minutes of sleep including  29 minutes of REM sleep, 1 obstructive apnea and 14 hypopneas were noted  with an AHI of 18 events per hour and a lower desaturation of 72% and a  level of 18 cm for 17 minutes including 14.5 minutes of REM sleep, 1  central apnea and 9 hypopneas were noted and an AHI of 35 events per  hour and the lowest desaturation of 79%.  Hence, she was placed on BiPAP  of 21/17 cm.  At this level for 16 minutes of sleep including 7.5  minutes of REM sleep, 5 hypopneas were noted with an AHI of 19 events  per hour and a low desaturation of 83.  At a final BiPAP level of 22/17  cm for 13.5 minutes of  sleep including 10 minutes of REM sleep, 5  hypopneas were noted with an AHI of 22 events per hour and the lowest  desaturation of 87%.   OXYGEN SATURATION DATA:  The lowest desaturation on BiPAP 21/17 was 83%  in spite of 3 L of oxygen.  At the final level of 22/17, the lowest  desaturation was 87% on 3 L of oxygen.  O2 was initiated at 11:15 p.m.   AROUSAL DATA:  The arousal index during the diagnostic portion was 81  events per hour.   LIMB MOVEMENT DATA:  No significant limb movements were noted.   CARDIAC DATA:  The low heart rate was 30 beats per minute during non-REM  sleep.  During the diagnostic portion, the high heart rate recorded was  an artifact, isolated PVCs were noted.   Discussion: She was desensitized with a ResMed Quattro full-face medium-  size mask.  BiPAP was initiated due to high level of CPAP requirement  and REM sleep.  O2 was initiated at 2 L per minute due to desaturations  and increased to 3 L per minute.  She felt somewhat rested on her post  study questionnaire and rated her sleep as deep.   IMPRESSION:  1. Severe obstructive sleep apnea with  desaturations and sleep      fragmentation.  2. This was corrected by a final bilevel positive airway pressure      level of 22/17 with 3 L of oxygen blended in with a full-face mask.  3. No cardiac arrhythmias, limb movements, or behavioral disturbance      during sleep were noted.  4. Baseline saturation of 82% on room air qualifies her for oxygen.   RECOMMENDATIONS:  1. BiPAP should be initiated with a final goal of 22/17 with 3 L of      oxygen blended in with a full-face mask.  2. She should be asked to avoid driving when sleepy.  She should be      asked to avoid medications with sedative side effects.  Compliance      should be emphasized.      Oretha Milch, MD  Electronically Signed     RVA/MEDQ  D:  06/13/2008 09:33:25  T:  06/13/2008 22:40:06  Job:  [id number]

## 2010-07-07 NOTE — H&P (Signed)
[name], FALTER NO.:  [id number]   MEDICAL RECORD NO.:  [id number]          PATIENT TYPE:  EMS   LOCATION:  MAJO                         FACILITY:  [facility]   PHYSICIAN:  Lonia Blood, M.D.DATE OF BIRTH:  [birth year]   DATE OF ADMISSION:  05/01/2008  DATE OF DISCHARGE:                              HISTORY & PHYSICAL   PRIMARY CARE PHYSICIAN:  Dr. Dorothyann Peng.   CHIEF COMPLAINT:  Dizziness with nausea and vomiting and severe  generalized weakness.   PRESENT ILLNESS:  [name] is a very pleasant 70 year old  female.  She underwent a corrective surgery to her left eye that she  cannot further describe approximately 1 week ago.  She states that she  has been in her usual state of health and has been taking all of her  usual medications.  She presented to her ophthalmologist's office today  for routine followup.  While there she was struck with a sudden episode  of severe dizziness.  She felt as though she was going to pass out but  she did not.  With this, she also began to experience diaphoresis.  She  was stricken with the acute onset of severe nausea and actually vomited  1 or 2 times.  There was no blood in her vomitus.  She had no abdominal  pain or cramps.  She had no chest pain.  She has had no recent dyspnea  on exertion.  She states that her p.o. intake has been consistent.  She  denies recent upper respiratory infections, fevers, chills or nausea and  vomiting prior to today.  She denies diarrhea, hematochezia, hematemesis  or melena.  Because of the acuity of her symptoms, she was transferred  to the ER for evaluation.  In the emergency room she is somewhat  lethargic but is easily woken.  She denies any additional complaints  beyond those detailed above.   REVIEW OF SYSTEMS:  A comprehensive review of systems is accomplished  and is unremarkable except for the multiple positive elements noted in  the history of present illness  above.   PAST MEDICAL HISTORY:  1. Uncontrolled diabetes mellitus type 2.  2. Hypertension.  3. A history of coronary artery disease, though further details are      not available.  4. Obesity.  5. Hyperlipidemia.  6. History of CVA, though details are unavailable.  7. Asthma.  8. Status post bilateral oophorectomies.  9. Ongoing tobacco abuse in the amount of a half pack per day.  10.Status post cataract extractions bilaterally in 2007 with apparent      recent follow-up surgery of unknown detail 1 week ago.  11.Possible chronic renal insufficiency with a baseline creatinine      apparently approximately 1.5.   OUTPATIENT MEDICATIONS:  1. Norvasc 10 mg daily.  2. Atenolol 100 mg daily.  3. Imdur 20 mg b.i.d.  4. Aldactone 25 mg daily.  5. Lasix 40 mg b.i.d.  6. Aspirin 81 mg daily.  7. Albuterol 2 puffs q.i.d.  8. Lantus insulin 47 units q.h.s.  9. Altace - dose unclear.  ALLERGIES:  SULFA.   FAMILY HISTORY:  The patient's mother died at age 75 with a CVA.  The  patient's father died in his early 45s with an MI.  The patient actually  has a daughter who has confirmed coronary artery disease.   SOCIAL HISTORY:  The patient is a widow.  She does not drink alcohol.  She is a retired Advertising copywriter.   DATA REVIEWED:  Sodium, potassium, chloride and bicarb are normal.  BUN  is elevated at 34, creatinine is elevated at 1.9, glucose is 100.  Hemoglobin is 17 but white count and platelet count are not checked.  Myoglobin is elevated at 239.  Point-of-care markers are negative x3  total.  KUB reveals no acute disease.  CT scan of the head reveals no  acute disease.  Chest x-ray reveals no acute disease.   PHYSICAL EXAMINATION:  Temperature 97.7, blood pressure is 97/49, heart  rate 59, respiratory rate 16, O2 saturation is 95% on 3 liters per  minute nasal cannula.  CBG is 127.  GENERAL:  An obese female in no acute respiratory distress who is  somewhat lethargic.  HEENT:   Normocephalic, atraumatic.  Pupils are minimally reactive  bilaterally but are equal.  There is a light reflex to suggest bilateral  lens implants.  The patient is edentulous.  Oral mucosa, oropharynx is  remarkable for dry mucous membranes.  NECK:  No JVD.  LUNGS:  Clear to auscultation bilaterally but breath sounds are  remarkably distant bilaterally with no appreciable wheeze.  CARDIOVASCULAR:  Heart sounds are very distant but I do not appreciate a  gallop or rub.  The patient is somewhat bradycardic with an average  heart rate between 50 and 60 during my exam.  There is no appreciable  murmur, though it would be easy to miss due to decreased volume of the  patient's heart sounds.  ABDOMEN:  Morbidly obese.  Soft bowel sounds are present.  There is no  organomegaly and there is no rebound.  There is no ascites.  EXTREMITIES:  No significant cyanosis, clubbing, or edema of the  bilateral lower extremities.  NEUROLOGIC:  The patient is lethargic but is able to answer all  questions.  She is alert and oriented x4.  Cranial nerves II-XII are  intact bilaterally, 5/5 strength is displayed in the bilateral upper and  lower extremities.   IMPRESSION AND PLAN:  1. Sign and symptom complex - the patient presents with a      constellation of symptoms to include a presyncopal spell, severe      generalized weakness, nausea, vomiting, dehydration, and acute      renal insufficiency beyond chronic renal insufficiency.  My best      guess at the present time is the patient simply has been compliant      with her blood pressure medications, has not hydrated or eaten      normally due to her recent surgery, and has simply become severely      dehydrated.  She is somewhat hypotensive, which would support this.      My initial treatment plan will be to simply hydrate her.  We will      go and cycle cardiac enzymes in this patient who reportedly has a      history of coronary disease.  Though  echocardiogram at the present      time is not acute, we will cycle q.a.m. echocardiograms as well.  Given the patient is obese, we must also consider the possibility      that her episode represented an acute pulmonary embolism.  I will      check a D-dimer.  In the interim, I will treat the patient with      full-dose Lovenox.  If the patient's D-dimer proves to be positive,      we will need to pursue a computerized tomography scan of the chest      if the patient's renal function improves or alternatively, we will      need to consider a VQ scan.  Regardless, the pretest probability is      sufficiently high to warrant treatment for pulmonary embolism in      this obese patient who has recently undergone surgery and has been      sessile as a result.  Thyroid-stimulating hormone will also be      assessed as will a full electrolyte panel and a full complete blood      count.  2. Acute on chronic renal sufficiency - this is felt to most likely      represent a prerenal azotemia.  We will check a urinalysis.  We      will hydrate the patient closely and follow her renal function.  3. Diabetes mellitus type 2 - the patient will be placed on sliding-      scale insulin for close monitoring of her capillary blood glucose.      We will dose her at a decreased level of Lantus as I anticipate her      p.o. intake will be limited.  We will adjust treatment as indicated      based upon the results of her capillary blood glucoses.  4. Tobacco abuse - we will educate the patient on the need to      discontinue tobacco abuse when she is more alert and responsive.      Tobacco cessation consultation will be requested to help drive this      point home.  5. Hypotension - this is most likely secondary to simple dehydration      due to decreased p.o. intake in the face of ongoing use of      diuretics and antihypertensives.  We will hold all      antihypertensives.  We will attempt to administer  Imdur as the      patient's blood pressure allows.  We will hydrate the patient      aggressively using isotonic saline crystalloid solution and we will      follow her vitals closely.  6. Obesity - when the patient is more alert, we will educate the      patient on the need for weight loss as it pertains to her chronic      medical problems.      Lonia Blood, M.D.  Electronically Signed     JTM/MEDQ  D:  05/01/2008  T:  05/01/2008  Job:  [id number]   cc:   Candyce Churn. Allyne Gee, M.D.

## 2010-07-07 NOTE — Group Therapy Note (Signed)
[name], OFFNER NO.:  [id number]   MEDICAL RECORD NO.:  [id number]          PATIENT TYPE:  INP   LOCATION:  3316                         FACILITY:  [facility]   PHYSICIAN:  Charlestine Massed, MDDATE OF BIRTH:  [birth year]                                 PROGRESS NOTE   PROBLEM LIST:  [name] is a 70 year old female with a history of  conditions with the following issues:  1. Chronic obstructive pulmonary disease.  2. Tobacco dependence leading to chronic obstructive pulmonary      disease.  3. Morbid obesity.  4. Currently having acute bronchitis with chronic obstructive      pulmonary disease exacerbation.  5. 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  6. Diabetes mellitus.  7. Hypertension.  8. To rule out obstructive sleep apnea as outpatient.  9. Acute renal failure on top of chronic renal insufficiency.   All of these conditions were explained to the patient very clearly.  Echocardiogram also shows mild aortic stenosis with normal ejection  fraction, dilated right atrium and right ventricle and a mild tricuspid  regurgitation.  His conditions are explained to patient clearly, stating  that most of the disease of the right side of the heart and the lungs is  caused by a longstanding history of smoking with more than 1 pack a day  for the past 50 years.  I explained to her clearly that she currently  missed the criteria for nasal oxygen 24 hours a day due to her chronic  obstructive pulmonary disease and existing cor pulmonale.  I also  emphasized the importance that she needs to see a pulmonologist  outpatient, and needs to be followed up clearly.  She also needs to  continue the CPAP.  She needs to get a polysomnogram test as an  outpatient, after which she can be put on a CPAP.  When I was seeing her  yesterday when she was sleeping, I could see visible episodes of apnea  happening while she was asleep, but we need documentation of  obstructive  sleep apnea and in this case, she needs to get polysomnography.   Patient is morbidly obese with a BMI of 45.7, and it was explained to  her clearly that she is eligible for weight-losing surgery, so she can  now approach bariatric surgery when she is discharged for further  followup.  I explained to her that the obstructive sleep apnea component  and the diabetes can be well-controlled and possibly reversed to some  extent with the weight loss and bariatric surgery can help her.  She  expressed understanding of all of those features discussed.   Currently, she has been put on Nicoderm patch for smoking cessation.  Patient agrees to continue nicotine patch and stay off smoking.  I  explained to her that clearly, that her progressive lung disease is not  only causing damage to the lung but also the heart and failure to use  oxygen and failure to stop smoking will worsen her lung disease, and she  can ultimately end up in death.  Also, the need for CPAP machine after  the sleep study has been emphasized, as I explained to her that  arrhythmias can happen with somebody with sleep apnea who is not on a  CPAP machine and that can cause sudden death while sleeping.   Patient has been emphasized the importance of seeing a pulmonologist as  an outpatient.  Will give further information and contact numbers at the  time of discharge.      Charlestine Massed, MD  Electronically Signed     UT/MEDQ  D:  05/03/2008  T:  05/03/2008  Job:  [id number]

## 2010-07-07 NOTE — Discharge Summary (Signed)
[name], [name]            ACCOUNT NO.:  [id number]   MEDICAL RECORD NO.:  [id number]          PATIENT TYPE:  INP   LOCATION:  6741                         FACILITY:  [facility]   PHYSICIAN:  Charlestine Massed, MDDATE OF BIRTH:  May 26, 1940   DATE OF ADMISSION:  05/01/2008  DATE OF DISCHARGE:                               DISCHARGE SUMMARY   PRIMARY CARE PHYSICIAN:  Dr. Dorothyann Peng.   PULMONOLOGY:  Dr. Cyril Mourning at [facility].   REASON FOR ADMISSION:  Dizziness, nausea, vomiting, and shortness of  breath.   DISCHARGE DIAGNOSIS:  1. Chronic obstructive pulmonary disease.  2. Visible episodes of sleep apnea, possible obstructive sleep apnea.      Sleep study scheduled  3. Hypertension.  4. Diabetes mellitus.  5. Metabolic syndrome.  6. Tobacco dependence.  7. Morbid obesity.  8. Chronic cor pulmonale.  9. Chronic renal insufficiency, chronic kidney disease stage III.  10.Home oxygen therapy.  11.Continuous positive airway pressure therapy.   DISCHARGE MEDICATIONS:  1. Aspirin 81 mg p.o. daily.  2. Cardizem CD 180 mg p.o. daily.  3. Labetalol 400 mg p.o. b.i.d.  4. Lantus 35 units at bedtime subcutaneously.  5. NovoLog coverage q.a.c./at bedtime on a sensitive scale      subcutaneously, max dose 10 units per dose.  6. Nicotine patch 14 mg per day for 7 days and then 7 mg per day for      14 days and then stop.  7. Advair discus 250/50 one puff b.i.d. after the prednisone course is      stopped.  8. Prednisone 30 mg p.o. on May 06, 2008, taper by 5 mg per day.  9. Protonix 40 mg p.o. daily.  10.Senna 2 tablets at bedtime.  11.Actos 30 mg p.o. daily.  12.Zocor 20 mg p.o. at medications.   Medications that were stopped which she was previously taking:  1. Allopurinol.  2. Lasix.  3. Atenolol.  4. Spironolactone.  5. Isosorbide.  6. Norvasc.   HOSPITAL COURSE BY PROBLEMS:  1. Nausea and vomiting with generalized weakness.  The patient came  and was admitted basically for nausea and vomiting with generalized      weakness.  After admission, she was feeling much.  Her nausea and      vomiting were much controlled after admission.  She did not have      any further episodes.  The exact source is not well known.      Diabetic gastroparesis could be attributed to the nausea and      vomiting but so far during hospital course, she did not have      further episodes of nausea and vomiting.   1. COPD, obstructive sleep apnea, and morbid obesity.  All these the      three above-mentioned issues are conglomerate acting with each      other.  When I saw her on the next morning of admission, she was      extremely drowsy and she was sleeping through the conversation very      frequently.  Carbon dioxide narcosis was suspected.  She had  an ABG      done right away which showed a pH of 7.245, a pCO2 of 72, and pO2      of 60.7  With those, the patient was diagnosed with acute      respiratory acidosis with CO2 narcosis, was moved to the step-down      unit, and placed on BiPAP.  The patient improved considerably after      that.  Repeat ABG later that day found  to have a pH of 7.32 and      pCO2 came down to 54.9.  The patient tolerated BiPAP very well and      then was switched over to nasal oxygen.  The patient has been on      nasal oxygen since.  She had an ABG done later on March 11 when she      was taken of the nasal oxygen for some time and then she was asked      to walk in the room and then an ABG was done which showed a pO2 of      55.3, almost close to the cut of for home oxygen.  An      echocardiogram done revealed evidence of cor pulmonale with      enlarged right ventricular and right atrium.  Right ventricular      systolic pressure was in the range of 55-60.  In view of her window      due to obesity and hyperinflated lungs and poor echocardiographic      window, exact pulmonary artery pressure was possibly not  measured.      Her left ventricular ejection fraction was found to be 60-65% with      moderately increased left ventricular wall thickening consistent      with abnormal relaxation and increased filling pressures.  There      was no evidence of aortic regurgitation but she had mild aortic      valve stenosis and a dilated left atrium.  The patient was then      maintained on oxygen.  She was found to have visible episodes of      apnea while she was sleeping which was witnessed by me and in view      of her being morbidly obese and her risk of sleep obstructive sleep      apnea being very high due to a thick neck, she was started on CPAP.      CPAP titration was done by the respiratory therapist here.      Outpatient sleep study has been set up to be read by Dr. Vassie Loll.  I      discussed with the pulmonologist phone call over the weekend who      advised to set it up with Dr. Vassie Loll for pulmonology follow-up and      sleep study follow-up.  The patient has been educated clearly about      the damage that has been caused to her lungs with tobacco and the      effects of obstructive sleep apnea and the risks of having this      disease which can end up in fatal  cardiac arrhythmias.  The      patient exhibited understanding and has verbally stated that she is      not going to smoke again and she will follow up with pulmonology as      she has been advised.  She has been given the phone numbers for      getting appointment at [facility] with Dr. Vassie Loll for      further followup.  A sleep study appointment has also been set up.      The patient has been issued a prescription for a CPAP machine at      home and home oxygen due to the presence of COPD with low CO2 in      the presence of cor pulmonale.  The patient has been educated about      losing weight.  The patient is eligible for bariatric surgery and      she needs to be referred for bariatric surgery by her primary care       physician, Dr. Allyne Gee.  The patient has been instructed to follow      his advice with regards.   1. Hypertension.  Her blood pressure was initially uncontrolled.  She      was also having lower extremity edema which could be caused by      Norvasc, people on Norvasc being more susceptible for lower      extremity edema even though she has cor pulmonale which can be      easily cause lower extremity edema too.  Blood pressures were not      controlled and so she was taken of those medications and started on      a new set of medications she has mild aortic stenosis and so we      cannot started her on any ACE inhibitors or ARBs as aortic stenosis      tends to be progressive which can cause further reduction in pre      and afterload.  So, she was started on Cardizem and labetalol and      blood pressures have been controlled considerably well right now.      She needs to follow up with her primary care doctor for further      management of her blood pressure as outpatient.   1. Diabetes.  She is currently being managed with Lantus 35 units and      NovoLog coverage q.a.c. and at bedtime.  In view of her chronic      renal insufficiency and fluid retention,  metformin and Actos have      been held and so she will continue to be on Lantus with NovoLog      coverage.  As mentioned earlier, the patient has been advised to      follow up with bariatric surgery and has been educated that the      control of blood sugar for be much more effective if she looses      weight and weight loss advice and dietary advice have been given.   1. Chronic kidney disease - stage III:  Her discharge the labs are:      BMP:  Sodium 139, potassium 34.7, chloride 100, bicarb 28, glucose      100, BUN 27, creatinine 1.43 and the estimated GFR for her is 44.      So, she is diagnosed with chronic kidney disease stage III.  She      has a tendency to accumulate potassium even though she is not on      Aldactone  anymore.  So, she has been advised to follow a diabetic      diet with low-salt, heart-healthy along with a 2 grams potassium  diet.  Dietary instructions have been given by the nurse at the      time of discharge.   1. Dyslipidemia.  She had an LDL of 110 and triglycerides of 148 with      a low HDL of 27.  She has been started on Zocor and has been      advised to follow up with her primary care doctor for further      checkups for liver function tests after 2 weeks and dietary advice      has been given.   1. Possible obstructive sleep apnea.  A CPAP machine has been ordered.      CPAP titration has been done overnight here and she has been set at      a CPAP rate of 10 cm of water for her CPAP pressures and patient      has been given a prescription for a CPAP machine at home and home      oxygen at home.   DISCHARGE INSTRUCTIONS:  1. Get outpatient sleep study as has been scheduled which will be read      by Dr. Vassie Loll of [facility].  2. Follow up with Dr. Vassie Loll in 1 week, phone number [phone], follow-up      office as being [address] which is opposite [facility].  3. To see Dr. Allyne Gee in 1 week for check of BMP and LFTs.  4. Use home oxygen 24 hours a day and CPAP at night at 10 cm of water      pressure.   FOLLOWUP:  1. Follow up with Dr. Vassie Loll at [facility] in 1 week.  2. Sleep study, outpatient  3. Dr. Allyne Gee, follow up in 01/08.   DISPOSITION:  The patient is discharged back home.  The patient's  daughter who was with her has also been educated about these  appointments with the permission of the patient and she is aware of the  issues too.      Charlestine Massed, MD  Electronically Signed     UT/MEDQ  D:  05/05/2008  T:  05/05/2008  Job:  [id number]   cc:   Candyce Churn. Allyne Gee, M.D.  Oretha Milch, MD

## 2010-07-10 NOTE — Op Note (Signed)
NAMERHEANNON, CERNEY            ACCOUNT NO.:  [id number]   MEDICAL RECORD NO.:  [id number]          PATIENT TYPE:  AMB   LOCATION:  SDS                          FACILITY:  [facility]   PHYSICIAN:  Salley Scarlet., M.D.DATE OF BIRTH:  [birth year]   DATE OF PROCEDURE:  12/17/2006  DATE OF DISCHARGE:  11/21/2006                               OPERATIVE REPORT   PREOPERATIVE DIAGNOSIS:  Painful, enlarged filtering bleb, right eye.   POSTOPERATIVE DIAGNOSIS:  Painful, enlarged filtering bleb, right eye.   OPERATION:  Revision of filtering bleb.   SURGEON:  Nadyne Coombes, M.D.   ANESTHESIA:  Local using Xylocaine 2% with Marcaine 0.75% and Wydase.   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  The bleb has increased in size and  had become quite painful and uncomfortable to her.  The patient was  counseled as to the nature of  the procedure and revision or excision of  this filtering bleb was recommended.  She is admitted at this time for  that purpose.   PROCEDURE:  Under the influence of IV sedation, a Van Lint akinesia and  retrobulbar anesthesia was given.  The patient was prepped and draped in  the usual manner.  The lid speculum was inserted under the upper and  lower lid of the right eye and a full silk traction suture was passed  through the belly of the superior rectus muscle retraction.  The extent  of the lesion was inspected.  Then using sharp and blunt dissection.  The lesion was carefully undermined and superiorly and carefully  dissected away from the sclera.  After having done this, hemostasis  achieved using cautery and fluorescein dye was applied to the area of  the conjunctival wounds to see if there were a leak in this area.  After  ascertaining there was no leak, the conjunctiva was reapproximated using  8-0 Vicryl suture.   Celestone 1 mL, 0.5 mL of gentamicin were injected  subconjunctivally and Maxitrol ophthalmic ointment was applied  to the  eye with a patch and Fox shield.  The patient tolerated the procedure  well, was discharged to the post anesthesia recovery room in  satisfactory condition with instructions to take Darvocet every 4 hours  as needed for pain and to see me in the office  tomorrow for further  evaluation   DISCHARGE DIAGNOSES:  1. Enlarged, painful filtering bleb, right eye.  2. Status postop cataract extraction, right eye.      Salley Scarlet., M.D.  Electronically Signed     TB/MEDQ  D:  12/17/2006  T:  12/18/2006  Job:  [id number]

## 2010-07-10 NOTE — H&P (Signed)
[name], [name] NO.:  [id number]   MEDICAL RECORD NO.:  [id number]          PATIENT TYPE:  AMB   LOCATION:  SDC                           FACILITY:  [facility]   PHYSICIAN:  Juluis Mire, M.D.   DATE OF BIRTH:  1940/11/25   DATE OF ADMISSION:  DATE OF DISCHARGE:                                HISTORY & PHYSICAL   The patient is a 70 year old gravida 5 para 5 black female who presents for  hysteroscopy.   In relation to the present admission, the patient has been having  postmenopausal bleeding.  Because of a stenotic cervix, we were unable to  evaluate and sample in the office.  We did do an ultrasound evaluation.  There was a 7-mm endometrial stripe with some prominence of the endometrium.  I thought it represented a fibroid but felt like endometrial sampling would  be indicated.  It is of note, she has had previous bilateral salpingo-  oophorectomy.  She now presents for hysteroscopic evaluation of the  endometrium.   Her other significant history is that she does have a history of cardiac  disorder.  She has multiple cardiovascular risks.  She had a catheterization  that did reveal coronary disease.  She was evaluated by Dr. Smith Mince including a  Cardiolite stress test.  She has been, indeed, cleared for surgery.   MEDICATIONS:  Medications at the present time include Atenolol, Norvasc,  colchicine, spironolactone, aspirin, Isorbid, Lasix, and Zocor.   PAST MEDICAL HISTORY:  Significant for history of cardiovascular disorder.  She is on the above-noted medications and followed by her cardiologist.   PAST SURGICAL HISTORY:  She had BSO which looked like it occurred in 1972  and another ovary was removed in 1975.  She has had previous heart  catheterizations.   OBSTETRICAL HISTORY:  She has had five spontaneous vaginal deliveries.   FAMILY HISTORY:  Brother has a history of lung cancer.  Mother, father, and  brother have a history of hypertension.  Father  and daughter have a history  of heart disease.   SOCIAL HISTORY:  Social history does reveal a half a pack per day tobacco  use and no alcohol use.   REVIEW OF SYSTEMS:  Noncontributory.   PHYSICAL EXAMINATION:  VITAL SIGNS:  The patient is afebrile with stable vital signs.  HEENT:  The patient is normocephalic.  Pupils are equal, round, and reactive  to light and accommodation.  Extraocular movements were intact.  Sclerae and  conjunctivae are clear.  Oropharynx is clear.  NECK:  Without thyromegaly.  BREASTS:  No discrete masses.  RESPIRATORY:  Lungs are clear.  CARDIOVASCULAR:  Regular rhythm and rate without murmurs or gallops.  ABDOMEN:  Her abdominal exam is benign.  There are no masses or organomegaly  or tenderness.  PELVIC:  On pelvic exam, normal external genitalia.  Vaginal mucosa is  somewhat atrophic.  Cervix is unremarkable.  The uterus appears to be normal  in size, shape, and contour.  Adnexa unremarkable.  EXTREMITIES:  Trace edema.  NEUROLOGIC:  Exam is grossly within normal limits.   IMPRESSION:  1.  Postmenopausal bleeding.  Rule out endometrial pathology.  2.      Atherosclerotic cardiovascular disease.  3. Hypertension.   PLAN:  The patient is to undergo hysteroscopic evaluation and resection of  endometrium.  Risks have been discussed including the risk of infection.  The risk of hemorrhage could require a transfusion or possible hysterectomy.  Risk of injury to adjacent organs that could require further exploratory  surgery.  Risk of deep venous thrombosis and pulmonary embolus.  Also,  cardiac risks have been discussed.                                               ______________________________  Juluis Mire, M.D.    JSM/MEDQ  D:  03/16/2004  T:  03/16/2004  Job:  [id number]

## 2010-07-10 NOTE — Op Note (Signed)
[name], [name]            ACCOUNT NO.:  [id number]   MEDICAL RECORD NO.:  [id number]          PATIENT TYPE:  AMB   LOCATION:  SDS                          FACILITY:  [facility]   PHYSICIAN:  Salley Scarlet., M.D.DATE OF BIRTH:  [birth year]   DATE OF PROCEDURE:  DATE OF DISCHARGE:  01/21/2006                               OPERATIVE REPORT   PREOPERATIVE DIAGNOSIS:  Immature cataract right eye.   POSTOPERATIVE DIAGNOSIS:  Immature cataract right eye.   OPERATION:  Kelman phacoemulsification of cataract eye with intraocular  lens implantation.   ANESTHESIA:  Local using Xylocaine 2% with Marcaine 0.75% and Vitrase.   JUSTIFICATION FOR PROCEDURE:  This is a 70 year old diabetic lady who  underwent a cataract extraction of the left eye approximately 2 months  ago with good visual result.  She has a lesser cataract of the right eye  with visual acuity best corrected to 20/50.  She complains of blurring  of vision still with difficulty seeing to read.  Cataract extraction  with intraocular lens implantation was recommended.  She is admitted at  this time to have the proposed procedure.   Under the influence of IV sedation, van Lint akinesia and retrobulbar  anesthesia was given.  The patient was prepped and draped in the usual  manner.  The lid speculum inserted under the lower lid of the right eye  and a 4-0 silk traction suture was passed through the belly of the  superior rectus muscle for traction.  A fornix-based conjunctival flap  was turned and hemostasis achieved using cautery.  Incision made in the  sclera at the limbus.  This incision was dissected down to clear cornea  using the crescent blade.  A sideport incision made at the 1:30 o'clock  position.  Ocucoat was injected into the eye through the sideport  incision.  The anterior chamber was entered through the corneoscleral  tunnel incision at 11:30 o'clock position using a keratome.  An anterior  capsulotomy was  done using a bent 25-gauge needle.  The nucleus was  hydrodissected using Xylocaine.  The KPE handpiece was passed into the  eye and the nucleus was emulsified without difficulty.  The residual  cortical material was aspirated.  The posterior capsule was polished  using the Olive tip polisher.  Near the end of the polishing procedure  it was noted that a tear suddenly came into the posterior capsule and  the vitreous presented itself into the anterior chamber.  An anterior  vitrectomy was done and a posterior chamber lens was seated behind the  iris into the sulcus.  However, it was felt that the lens was most  posterior that it should be, therefore the decision was made to remove  this lens and to insert an anterior chamber lens.  An anterior chamber  lens was inserted into the eye, across the pupil, and rotated in  horizontal fashion.  A peripheral iridectomy was made in the iris.  The  corneoscleral wound, which had been widened to accommodate the anterior  chamber lens, was then closed by using a combination of interrupted  sutures of 8-0  Vicryl and 10-0 nylon.  After it was ascertained that the  wound was airtight and watertight, the conjunctiva was closed using  thermal cautery.  One mL of Celestone and 0.5 mL of gentamicin were  injected subconjunctivally.  Maxitrol ophthalmic ointment and atropine  drops were applied along with a patch and Fox shield.  The patient  tolerated the procedure well and was discharged to the post anesthesia  recovery in satisfactory condition.  She is  instructed to rest today, to take Vicodin every 4 hours as needed for  pain and to see me in the office tomorrow for further evaluation.   DISCHARGED DIAGNOSIS:  Immature cataract right eye.      Salley Scarlet., M.D.  Electronically Signed     TB/MEDQ  D:  01/22/2006  T:  01/23/2006  Job:  [id number]

## 2010-07-10 NOTE — Discharge Summary (Signed)
[name], [name] NO.:  [id number]   MEDICAL RECORD NO.:  [id number]          PATIENT TYPE:  INP   LOCATION:  3704                         FACILITY:  [facility]   PHYSICIAN:  Theresia Bough, MD       DATE OF BIRTH:  09-Oct-1940   DATE OF ADMISSION:  05/16/2006  DATE OF DISCHARGE:  05/22/2006                               DISCHARGE SUMMARY   PRIMARY CARE PHYSICIAN:  [name], M.D.   DISCHARGE DIAGNOSES:  1. Uncontrolled diabetes mellitus type 2 late onset.  2. History of hypertension which is controlled.  3. History of coronary artery disease which is being managed      medically.  4. History of asthma which is stable.  5. History of obesity.  6. History of dyslipidemia.  Patient is Vytorin.   DISCHARGE CONDITION:  Patient is stable.   DISPOSITION:  Patient is discharged home.  She lives with her daughter.  Patient is to followup with her primary care [name], Dr. Dorothyann [name]  within 1 to 2 weeks.   DISCHARGE MEDICATIONS:  1. Albuterol inhaler twice daily.  2. Norvasc 10 mg p.o. daily.  3. Aspirin 81 mg daily.  4. Atenolol 50 mg p.o. b.i.d.  5. Vytorin 10/20 mg one tablet daily.  6. Atrovent inhaler.  Patient is also to be on Lantus 40 units subcu      q.h.s. and insulin NovoLog sliding scale protocol.  For blood      glucose 200 to 250 mg/dL patient is to receive 2 units of NovoLog.      For blood glucose 251 to 300 mg/dL patient is to receive 4 units of      NovoLog.  Blood glucose 301 to 350 patient is to receive 6 units of      NovoLog.  Blood glucose 351 to 400 patient is to receive 8 units of      NovoLog.  For blood glucose greater than 400 mg she is to call her      primary care [name] or go to the E.D.   CURRENT LABS:  Shows WBC 8, hemoglobin 11.3, hematocrit 34.3, platelets  of 157.  Current blood glucose shows a level between 187 mg/dL and 045  mg/dL.  Chemistry show sodium of 133, potassium 4.3, chloride of 105,  bicarb of 22, BUN  of 21, creatinine of 1.4.   Radiology tests done while in the hospital:  Patient had MRI of the head  without contrast which showed no acute abnormality.  MRI of the head  without contrast on May 20, 2006 shows no acute abnormality.  Portable  chest x-ray on May 16, 2006 shows minimal atelectasis of the lung  bases.  CT of the head done on May 16, 2006 shows atrophy and mild  chronic microvascular ischemic changes.   CONSULTATIONS:  Patient was seen by neurology after patient came to the  hospital because of slurred speech and weakness.  Patient then had MRI  of the head which was negative.  Patient was then managed medically to  control her blood glucose.  Blood glucose was very high  at the time she  came into the hospital.  Blood glucose was 105 to when she came into the  hospital.  This was responsible for hyperosmolar.  Her blood glucose was  105 to when she came into the hospital and this was responsible for her  symptoms.  Patient was also seen for swallowing evaluation.  There was  no signs or symptoms of oropharyngeal dysphagia.  Patient has no signs  dysphagia.  Patient also had EEG done in the hospital which shows no  seizure activity.  She was seen also by cardiology and because of  history of CAD the plan for that was to manage medically.   BRIEF HOSPITAL COURSE:  [name] was admitted on May 16, 2006  because of weakness, slurred speech, polyuria, polydipsia.  At that time  she denied any history of diabetes mellitus.  Her blood glucose was  found to be 105 which was very high.  Code stroke was called at the  emergency department because of slurred speech and left arm symptoms.  MRI of the head later showed no evidence of stroke.  She was seen by  neurology which recommended MRI of the head which was negative.  She  also had an EEG done which showed no evidence of seizure.  Patient was  then treated for hyperosmolar nonketotic state with vigorous hydration.   She was also started on insulin protocol.  Blood glucose gradually  improved.  Symptoms also gradually improved.  She was also seen by  physical therapy in the hospital to assist with her recovering her  strength.  She was seen by a nutritionist because of her high glucose  level.  She was educated concerning her diet.  She was also educated by  the nurses about how to check her blood glucose and how to given herself  insulin while at home.  I have told her that she has to followup with  her primary care [name] because medications need to be adjusted within  the next 1 to 2 weeks.  The patient is currently stable at this time and  she can be discharged home.  Her daughter who lives with her was present  at the time of my discussion with her and her daughter also knows how to  give her insulin.      Theresia Bough, MD  Electronically Signed     GA/MEDQ  D:  05/22/2006  T:  05/22/2006  Job:  [id number]   cc:   [name], M.D.

## 2010-07-10 NOTE — Op Note (Signed)
NAMESRESHTA, CRESSLER            ACCOUNT NO.:  [id number]   MEDICAL RECORD NO.:  [id number]          PATIENT TYPE:  AMB   LOCATION:  SDC                           FACILITY:  [facility]   PHYSICIAN:  Juluis Mire, M.D.   DATE OF BIRTH:  Nov 19, 1940   DATE OF PROCEDURE:  03/16/2004  DATE OF DISCHARGE:                                 OPERATIVE REPORT   PREOPERATIVE DIAGNOSIS:  Postmenopausal bleeding.   POSTOPERATIVE DIAGNOSIS:  Postmenopausal bleeding.   PROCEDURE:  Cervical dilatation, hysteroscopy with endometrial biopsies,  endometrial curettings.   SURGEON:  Juluis Mire, M.D.   ANESTHESIA:  General.   ESTIMATED BLOOD LOSS:  Minimal.   PACKS AND DRAINS:  None.   INTRAOPERATIVE BLOOD REPLACED:  None.   COMPLICATIONS:  None.   INDICATION:  As dictated in History and Physical.   PROCEDURE:  The patient was taken to the OR and placed in the supine  position, was then placed in the dorsal supine position using the Allen  stirrups.  The patient then underwent general anesthesia.  The patient's  perineum and vagina were prepped and draped Betadine and draped as a sterile  field.  A speculum was placed in the vaginal vault.  The cervix was grasped  with a single-tooth tenaculum.  Uterus sounded to approximately 10 cm.  Cervix was serially dilated to a size 35 Pratt dilator.  Operative  hysteroscope was then introduced and the intrauterine cavity was distended  using sorbitol.  She has one small apparent submucosal fibroid that was  resected.  Otherwise endometrium was smooth and atrophic.  Multiple biopsies  were obtained along with curettings.  Total deficit was 20 cc.  There were  no signs of perforation or complication.  At this point in time, the  hysteroscope had been removed.  The single-tooth tenaculum was retracted and  removed.  The patient was taken out of the dorsal lithotomy position.  Once  she was alert and extubated, she was transferred to the recovery room in  good condition.  Sponge, instrument, and needle count were reported as  correct by the circulating nurse.                                               ______________________________  Juluis Mire, M.D.    JSM/MEDQ  D:  03/16/2004  T:  03/16/2004  Job:  [id number]

## 2010-07-10 NOTE — Op Note (Signed)
[name], SERRATORE            ACCOUNT NO.:  [id number]   MEDICAL RECORD NO.:  [id number]          PATIENT TYPE:  AMB   LOCATION:  SDS                          FACILITY:  [facility]   PHYSICIAN:  Salley Scarlet., M.D.DATE OF BIRTH:  12-11-1940   DATE OF PROCEDURE:  DATE OF DISCHARGE:  11/12/2005                                 OPERATIVE REPORT   PREOPERATIVE DIAGNOSIS:  Immature cataract, left eye.   POSTOPERATIVE DIAGNOSIS:  Immature cataract, left eye.   OPERATION:  Kelman phacoemulsification of cataract, left eye.   ANESTHESIA:  Local using Xylocaine 2%with Vitrase.   JUSTIFICATION FOR PROCEDURE:  This is a 70 year old lady who complains of  burning and difficulty seeing to read.  She stated she is hardly able to see  anything from the left eye.  She was evaluated and found to have a visual  acuity best corrected to 20/25 on the right and 20/400 on the left.  There  was a dense posterior subcapsular cataract in the left eye with a lesser  cataract of the right eye.  Cataract extraction with intraocular lens  implantation was recommended.  She is admitted at this time for that  purpose.   PROCEDURE:  Under the influence of IV sedation, Van Lint akinesia and  retrobulbar anesthesia were given.  The patient was prepped and draped in  the usual manner.  The lid speculum was inserted under upper and lower lids  of the left eye and a 4-0 silk traction suture was passed through the belly  of the superior rectus muscle for retraction.  A fornix-based conjunctival  flap was turned and hemostasis achieved using cautery.  An incision was made  in the sclerae at the limbus.  This incision was dissected down to clear  cornea using a crescent blade.  A sideport incision made at the 1:30 o'clock  position.  OcuCoat was injected into the eye through the sideport incision.  The anterior chamber was then entered through the corneoscleral tunnel  incision at the 11:30 o'clock position  using a keratome.  An anterior  capsulotomy was done using a bent 25-gauge needle.  The nucleus was  hydrodissected using Xylocaine.  The KTP handpiece was passed into the eye  and the nucleus was emulsified without difficulty.  The residual cortical  material was aspirated.  The posterior capsule was polished using an olive-  tip polisher.  The wound was widened slightly to accommodate a foldable  silicone lens.  The lens was seated into the eye behind the iris without  difficulty.  The anterior chamber was reformed and the pupil was constricted  using Miochol.  The lips of the wound were hydrated and tested to make sure  there was no leak.  After ascertaining there was no leak, the conjunctiva  was closed over the wound using thermal cautery.  One milliliter of  Celestone and 0.5 mL of gentamicin were injected subconjunctivally.  Maxitrol ophthalmic ointment and Pilopine ointment were applied along with a  patch and Fox shield.  The patient tolerated the procedure well and was  discharged to the postanesthesia recovery room in  satisfactory condition.   She is instructed to rest today, to take Vicodin every 4 hours as needed for  pain and to see me in the office tomorrow for further evaluation.   DISCHARGE DIAGNOSIS:  Immature cataract, left eye.      Salley Scarlet., M.D.  Electronically Signed     TB/MEDQ  D:  11/13/2005  T:  11/16/2005  Job:  [id number]

## 2010-07-10 NOTE — Op Note (Signed)
NAMEASHLYN, [name]            ACCOUNT NO.:  [id number]   MEDICAL RECORD NO.:  [id number]          PATIENT TYPE:  AMB   LOCATION:                                 FACILITY:   PHYSICIAN:  Salley Scarlet., M.D.DATE OF BIRTH:  17-Jun-1940   DATE OF PROCEDURE:  04/29/2007  DATE OF DISCHARGE:                               OPERATIVE REPORT   PREOPERATIVE DIAGNOSES:  Painful, enlarged filtering bleb, left eye.   POSTOPERATIVE DIAGNOSES:   OPERATION:  Revision of filtering bleb and excision of tenon cyst.   ANESTHESIA:  Local using Xylocaine 2% with Marcaine 0.75% and Wydase.   JUSTIFICATION FOR PROCEDURE:  This is a 70 year old lady who has had  glaucoma surgery on both eyes; who has enlarged,  painful filtering  blebs.  She underwent a bleb revision of the right eye several months  ago; but the bleb of the left eye has continued to increase in size,  causing the lid to be unable to close properly.  Therefore, this has  caused her a considerable amount of pain.  The patient was counseled as  to the nature of problem, as well as to the nature of revision of an of  filtering bleb.  She was counseled that the bleb needs to be revised  and/or excised.  She is admitted at this time for that purpose.   PROCEDURE:  Under influence of IV sedation, the Van Lint akinesia and  retrobulbar anesthesia was given.  The patient was prepped and draped in  the usual manner.  The lid speculum was inserted under the upper and  lower lid of the left eye, and a 4-0 silk traction suture was passed  through the belly of the superior rectus muscle retraction.  The extent  of the lesion was inspected.  Then, using sharp and blunt dissection,  the lesion was carefully undermined from superior to the bleb, and  carefully dissected away from the sclera.  It was here that a large cyst  was encountered.  The lesion was excised in toto as well as the cyst.  Hemostasis was then achieved by using the cautery.   Fluorescein dye was  applied to the area beneath the conjunctival bleb, to see if there was a  leak in this area.  There was no leak; therefore, the conjunctiva was  reapproximated using 8-0 Vicryl suture.  After the conjunctiva and  tenons had been adequately approximated, 1 mL of Celestone and 0.5 mL of  gentamicin were injected subconjunctivally.  Maxitrol ophthalmic  ointment was applied, along with a patch and Fox shield.  The patient  tolerated the procedure well and was discharged to the post anesthesia  recovery in satisfactory condition.  She is advised to take Darvocet  every 4 hours as needed for pain, and will see me in my office tomorrow  for further evaluation.   DISCHARGE DIAGNOSES:  1. Enlarged, painful filtering bleb.  2. Tenons cyst.     Salley Scarlet., M.D.  Electronically Signed    TB/MEDQ  D:  04/29/2007  T:  05/01/2007  Job:  [id number]

## 2010-07-10 NOTE — Procedures (Signed)
EEG NUMBER:  [id number].  Date of birth 1941-01-27.   TECHNICIAN:  Gavin Pound.   REFERRING PHYSICIAN:  [facility] team.   This is a portable study for this right-handed patient which is  described as awake and asleep.  There is no comment  as to how far the  patient is oriented.  She is localized in room 2927, in [facility]  coronary care unit.  The patient is described as right-handed 70-year-  old female who was exposed to neither hyperventilation nor photic  stimulation in this bedside study.   Medications include Norvasc, atenolol, isosorbide dinitrate,  Spironolactone, Lasix, aspirin, Vytorin.   This is a 70 year old female with involuntary movement of the left arm,  confusion and slurred speech.  The patient's glucose was 1052 mg/dL on  admission.   DESCRIPTION:  A posterior dominant rhythm is seen at 8 Hz.  There is a  frequent motion artifact noted and EMG artifact especially in the  frontal polar region, right more than left.  The patient is described as  yawning and moving throughout the recording.  The EKG shows a normal  sinus rhythm throughout.  The posterior dominance is not well-formed.  The P 301 and P 402 electrodes seem to be disconnected during this  recording.  Throughout the recording there is no epileptiform discharge  noted, but a poorly organized background marked by motion artifact over  the frontal polar region bilaterally as well as right central and  temporally.   CONCLUSION:  I would recommend to repeat an EEG when the patient is more  able to comply.  I do not see evidence of an epileptiform activity.      Melvyn Novas, M.D.  Electronically Signed     RU:EAVW  D:  05/17/2006 15:56:11  T:  05/17/2006 18:36:59  Job #:  [id number]   cc:   [facility]

## 2010-07-10 NOTE — Consult Note (Signed)
NAMERANISHA, ALLAIRE NO.:  [id number]   MEDICAL RECORD NO.:  [id number]          PATIENT TYPE:  INP   LOCATION:  2927                         FACILITY:  [facility]   PHYSICIAN:  Gustavus Messing. Orlin Hilding, M.D.DATE OF BIRTH:  11/04/1940   DATE OF CONSULTATION:  05/16/2006  DATE OF DISCHARGE:                                 CONSULTATION   NEUROLOGY CONSULTATION   CHIEF COMPLAINT:  Code Stroke.  A Code Stroke was called at 1718 on  May 16, 2006.  Symptom onset was actually 2-3 days ago.  The patient's  primary physician is Dr. Dorothyann Peng.   HISTORY OF PRESENT ILLNESS:  Ms. Seminara is a 70 year old right-handed  black woman with a history of hypertension, remote strokes according to  the daughter.  She gives a history of 2-3 days of malaise, generalized  weakness, nausea, slurred speech.  She was on her own, at home, over the  weekend, but went to her daughter's this morning.  She normally lives  alone and is independent.  Around 10 o'clock she began to complain of  pain shooting up the left arm, with her arm jumping intermittently.  She  told her daughter that she thought she was having a mild stroke so the  daughter took her over to [facility], where Code Stroke was  called, and she sent them to [facility] emergency room.   REVIEW OF SYSTEMS:  Negative for fever, chest pain.  She does have some  shortness of breath.  She is having some numbness of the left hand and  middle 2 fingers and pain in the left arm.  No headache.   PAST MEDICAL HISTORY:  Significant for angina, hypertension,  hyperlipidemia.  No history of diabetes.  She had a remote oophorectomy,  apparently a remote cataract, obesity and remote strokes in 1982 and  1979, unsure what the residual was.  One time was associated with  dizziness.   MEDICATIONS:  Currently are:  1. Albuterol 2 puffs every 6 hours as needed.  2. Vytorin 10/20 once a day.  3. Aspirin 81 mg once a day.  4. Amlodipine 10 mg  once a day.  5. Furosemide 40 mg b.i.d.  6. Atenolol 100 mg every day.  7. Spironolactone 25 mg b.i.d.   ALLERGIES:  SULFA.   SOCIAL HISTORY:  She lives alone.  She is widowed.  She does still  smoke.   FAMILY HISTORY:  Positive for lupus, diabetes and kidney disease.   EXAMINATION:  VITAL SIGNS:  Temperature is 97.6, pulse 83, BP is 129/85,  respirations 20.  HEENT:  Head is normocephalic, atraumatic.  She is edentulous.  NECK:  Supple without bruits.  HEART:  Regular rate and rhythm.  LUNGS:  Clear to auscultation.  EXTREMITIES:  Without edema.  NEUROLOGIC EXAM:  On the stroke scale, item 1A, B and C.  On initial  evaluation, she was alert.  She only gets credit for 1 correct answer on  orientation; however, she initially told me she was 65 and then  corrected herself to 65.  However, her first answer was 51, which is  incorrect.  She was oriented to the correct time.  She followed 2/2  commands, so she gets 1 point for item 1.  Items 2, 3 and 4, she had  full extraocular movements, full visual fields and no facial asymmetry.  She gets a 0.  Item 5A and B, she had no drift of the left upper  extremity or right upper extremity.  Initially; however, she was  complaining of a lot of pain in her left arm and did not want to elevate  the arm.  Once she was finally able to elevate the arm, however, there  was no drift.  In the lower extremities, 6A and B, no drift.  She gets a  0 for her motor.  There was no evidence of sensory loss or ataxia on her  exam.  She gets a 0.  No evidence of aphasia.  She was able to describe  a picture, name object, read sentences.  No dysarthria on reading list  of words.  No extinction to double simultaneous stimulation; therefore,  her total scale on the stroke scale was 1.  However, later through the  exam she seemed intermittently to either be confused or having trouble  hearing me.  If I repeated myself, she seemed to understand me better.   Otherwise, no cognitive changes.  The remaining cranial nerves, she had  normal facial sensation.  Hearing was intact, although again,  occasionally, seemed to have trouble hearing me.  Tongue was midline.  On motor exam, the left upper extremity had variable tone.  At one point  it seemed perfectly normal.  At one point she seemed to have trouble  relaxing it, and she did have a 2-3 second of clonic activity in the  left upper extremity, only without loss of consciousness and no face or  leg involvement.  Deep tendon reflexes are depressed. She has downgoing  toes.   A CT scan of the head shows atrophy with hygromas or a benign CSF  collection, but no acute abnormalities in the parenchyma.  No evidence  of old strokes that were obvious.   IMPRESSION:  By history and exam, this is not a stroke.  She is not a  TPA candidate.  She is having intermittent left arm pain, with a  question of clonic seizure activity versus carpal spasm.   RECOMMENDATIONS:  Would get an MRI scan of the brain with and without  contrast and an EEG.  If the clonic activity returns in the left upper  extremity, we will treat that with Keppra IV 500 mg b.i.d.; however,  would like to try to get the EEGs first unless this recurs frequently.  Would hold off on the Keppra until after the EEG.      Catherine A. Orlin Hilding, M.D.  Electronically Signed     CAW/MEDQ  D:  05/16/2006  T:  05/17/2006  Job:  [id number]

## 2010-07-10 NOTE — H&P (Signed)
NAMESHERESE, HEYWARD NO.:  [id number]   MEDICAL RECORD NO.:  [id number]          PATIENT TYPE:  INP   LOCATION:  2927                         FACILITY:  [facility]   PHYSICIAN:  Hillery Aldo, M.D.   DATE OF BIRTH:  Apr 04, 1940   DATE OF ADMISSION:  05/16/2006  DATE OF DISCHARGE:                              HISTORY & PHYSICAL   PRIMARY CARE PHYSICIAN:  Dr. Dorothyann Peng   CHIEF COMPLAINT:  Malaise, generalized weakness, slurred speech, left  arm pain, polyuria, polydipsia.   HISTORY OF PRESENT ILLNESS:  The patient is a 70 year old female who  does not have any formal diagnosis of diabetes.  Apparently, she was  sent to diabetic education classes approximately a year ago but denies  ever being told she was diabetic or ever taking any kind of medicines  for the treatment of diabetes.  Over the past week, she has suffered  from increasing malaise, polyuria, polydipsia, which culminated today  and severe left arm pain for which she came to the emergency department  for evaluation.  When initially evaluated in the emergency department,  the patient was admitted as a code stroke.  Apparently, this was due to  her slurred speech and left arm symptoms.  She was evaluated by the  neurological team who did not feel there was any clinical evidence for  stroke, and on further laboratory evaluation, the patient was found to  have a marked elevation of her blood glucose and 1062.  She is therefore  being admitted for treatment of a hyperosmolar, nonketotic state.   ALLERGIES:  SULFA CAUSES A RASH.   MEDICATIONS:  1. Aspirin 81 mg daily.  2. Amlodipine 10 mg daily.  3. Albuterol q.6 h p.r.n.  4. Vytorin 10/20 daily.  5. Lasix 40 mg b.i.d.  6. Atenolol 100 mg daily.  7. Spironolactone 25 mg b.i.d.   PAST MEDICAL HISTORY:  1. Hypertension.  2. Cerebrovascular disease with prior strokes.  3. Dyslipidemia.  4. Cataracts status post bilateral extractions.  5. Obesity.  6. Coronary artery disease:  7  Asthma.  1. Remote history of bilateral oophorectomies.   FAMILY HISTORY:  The patient's mother died in her 68s from stroke.  Father died in his 20s from acute MI.  Both parents also had  hypertension.  Brother also has hypertension.  She has one daughter who  has coronary disease.   SOCIAL HISTORY:  The patient is widowed and lives alone.  She smokes  about half a pack of tobacco daily.  She denies any alcohol use.  She is  a retired Advertising copywriter.   REVIEW OF SYSTEMS:  No fever or chills.  No chest pain or arrhythmia.  She has had some shortness of breath.  The main complaint today was the  left arm pain with paresthesias and tingling.  Denied any headache.  She  has had diminished appetite, increased thirst and urination and no bowel  movement times 3 days.  No blood in the stool.   PHYSICAL EXAMINATION:  VITAL SIGNS: Temperature 97.6, pulse 83,  respirations 20, blood pressure 129/85, O2 saturation 96% on room  air.  GENERAL:  Obese female who appears to be fairly ill.  HEENT:  Normocephalic, atraumatic.  PERRL.  EOMI.  Oropharynx reveals  dry mucous membranes.  NECK:  Supple, no thyromegaly, no lymphadenopathy, no jugular venous  distension.  CHEST:  Diffuse expiratory wheezes bilaterally.  HEART:  Regular rate, rhythm.  No murmurs, rubs, or back gallops.  ABDOMEN:  Soft, nontender, nondistended with normoactive bowel sounds  good.  EXTREMITIES:  Trace pedal edema.  SKIN:  Warm and dry.  No rashes.   DATA REVIEWED:  Chest x-ray shows atelectasis at the bases.   CT scan of the head is not acute.  Does show atrophy and changes related  to atherosclerotic disease.   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  There is left ventricular hypertrophy and QRS widening.  She has Q-waves  in the anterior leads.  This is different from a previously done  EKG on  February 03, 2000, which did not show the left anterior fascicular  block.   LABORATORY DATA:  Sodium is 123, potassium 5.5, chloride 84, bicarbonate  19, BUN 61, creatinine 3.08, glucose 1052.  White blood cell count is  12.8, hemoglobin 14.9, hematocrit 47.1, platelets 225.  LFTs are within  normal limits.  Myoglobin is greater than 500, CK-MB 4.2, troponin I  0.07.  Urinalysis reveals 7-10 white blood cells, 0-2 red blood cells  and few bacteria.  Negative for nitrites.   ASSESSMENT/PLAN:  1. Hyperosmolar nonketotic state:  We will admit the patient and put      her on an insulin drip with the insulin adjusted via settings on a      glucomander.  We will hydrate her vigorously.  We will monitor her      electrolytes closely, q.4 h for the first 24 hours.  Once she is      stable, we will attempt to start her on oral hypoglycemics.      However, she may require lifelong insulin therapy.  We will get a      diabetic educator in to speak with her about diabetes and treatment      of diabetes as well as dietary restrictions.  2. Urinary tract infection:  We will start the patient empirically on      Cipro and send urine for culture.  3. Hyponatremia:  The patient's hyponatremia is reflective of her      profound hyperglycemia.  This should correct with correction of her      underlying hyperosmolar state.  4. Hyperkalemia:  The patient's hyperkalemia is likely reflective of      severe and profound dehydration.  I suspect this will correct with      rehydration.  5. Acute renal failure:  This is prerenal in etiology and secondary to      dehydration.  We will vigorously hydrate her, hold her Lasix and      spironolactone and monitor her renal function closely.  6. Elevated cardiac enzymes.  The patient has mild elevation of      cardiac enzymes.  She is not complaining of chest pain.  We will      monitor these q.8 h x3 and get her cardiologist involved if     indicated.  7.  Hypertension:  The patient is not currently hypertensive.  This is      likely reflective of her  profound dehydration.  Again, I will hold      the Lasix and spirolactone, but continue her atenolol and b.i.d.      dosing (Will hold for systolic pressures less than 110) and      amlodipine.  8. Dyslipidemia:  Will continue the patient's Vytorin.  9. Asthma:  The patient does have some acute bronchospasm.  We will      attempt to control this with nebulized bronchodilator therapy.  Do      not think there is an indication for steroids presently.  10.Prophylaxis:  Initiate GI prophylaxis with Protonix and DVT      prophylaxis with Lovenox.      Hillery Aldo, M.D.  Electronically Signed     CR/MEDQ  D:  05/16/2006  T:  05/17/2006  Job:  [id number]   cc:   Candyce Churn. Allyne Gee, M.D.

## 2010-11-13 LAB — URINALYSIS, ROUTINE W REFLEX MICROSCOPIC
Glucose, UA: NEGATIVE
Ketones, ur: NEGATIVE
Leukocytes, UA: NEGATIVE
Protein, ur: 100 — AB

## 2010-11-13 LAB — CBC
MCHC: 31.8
MCV: 86.3
Platelets: 245
RBC: 5.46 — ABNORMAL HIGH

## 2010-11-13 LAB — BASIC METABOLIC PANEL
BUN: 26 — ABNORMAL HIGH
CO2: 32
Chloride: 99
Creatinine, Ser: 1.87 — ABNORMAL HIGH

## 2010-11-13 LAB — URINE MICROSCOPIC-ADD ON

## 2010-12-03 LAB — URINALYSIS, ROUTINE W REFLEX MICROSCOPIC
Bilirubin Urine: NEGATIVE
Glucose, UA: NEGATIVE
Hgb urine dipstick: NEGATIVE
Specific Gravity, Urine: 1.015
pH: 5.5

## 2010-12-03 LAB — BASIC METABOLIC PANEL
GFR calc Af Amer: 40 — ABNORMAL LOW
GFR calc non Af Amer: 33 — ABNORMAL LOW
Glucose, Bld: 135 — ABNORMAL HIGH
Potassium: 3.9
Sodium: 138

## 2010-12-03 LAB — URINE MICROSCOPIC-ADD ON

## 2010-12-03 LAB — CBC
HCT: 48.3 — ABNORMAL HIGH
Hemoglobin: 15.4 — ABNORMAL HIGH
RBC: 5.55 — ABNORMAL HIGH
WBC: 16.6 — ABNORMAL HIGH

## 2011-02-10 ENCOUNTER — Other Ambulatory Visit: Payer: Self-pay

## 2011-02-10 ENCOUNTER — Emergency Department (HOSPITAL_COMMUNITY)
Admission: EM | Admit: 2011-02-10 | Discharge: 2011-02-10 | Disposition: A | Discharge status: Home / Self Care | Payer: Medicare Other | Attending: Emergency Medicine | Admitting: Emergency Medicine

## 2011-02-10 ENCOUNTER — Encounter: Payer: Self-pay | Admitting: Emergency Medicine

## 2011-02-10 DIAGNOSIS — H81399 Other peripheral vertigo, unspecified ear: Secondary | ICD-10-CM | POA: Insufficient documentation

## 2011-02-10 HISTORY — DX: Essential (primary) hypertension: I10

## 2011-02-10 MED ORDER — DIPHENHYDRAMINE HCL 50 MG/ML IJ SOLN
25.0000 mg | Freq: Once | INTRAMUSCULAR | Status: AC
  Administered 2011-02-10: 25 mg via INTRAVENOUS
  Filled 2011-02-10: qty 1

## 2011-02-10 MED ORDER — ONDANSETRON HCL 4 MG/2ML IJ SOLN
INTRAMUSCULAR | Status: AC
  Administered 2011-02-10 (×2)
  Filled 2011-02-10: qty 2

## 2011-02-10 MED ORDER — MECLIZINE HCL 50 MG PO TABS: 25.0000 mg | ORAL_TABLET | Freq: Four times a day (QID) | ORAL | Status: AC | PRN

## 2011-02-10 NOTE — ED Provider Notes (Signed)
History     CSN: [id number] Arrival date & time: 02/10/2011  3:04 AM   First MD Initiated Contact with Patient 02/10/11 [phone]      Chief Complaint  Patient presents with  . Dizziness    (Consider location/radiation/quality/duration/timing/severity/associated sxs/prior treatment) HPI This is a 70 year old black female who had the sudden onset of vertigo this morning when getting up to go the bathroom. She describes the symptoms as the room spinning uncontrollably whenever she stands up or tries to stand up. As a result of the symptoms she was unable to stand; she fell but denies injury. EMS was called. She did have nausea and vomiting and was given Zofran IV by EMS with some improvement in the nausea. She has not vomited in the ED. The vertigo is not present when she lies still. She denies headache, fever, cough, shortness of breath, chest pain abdominal pain.  Past Medical History  Diagnosis Date  . Hypertension   . Stroke     History reviewed. No pertinent past surgical history.  History reviewed. No pertinent family history.  History  Substance Use Topics  . Smoking status: Not on file  . Smokeless tobacco: Not on file  . Alcohol Use: No    OB History    Grav Para Term Preterm Abortions TAB SAB Ect Mult Living                  Review of Systems  All other systems reviewed and are negative.    Allergies  Sulfonamide derivatives  Home Medications  No current outpatient prescriptions on file.  BP 172/60  Pulse 75  Temp(Src) 97.9 F (36.6 C) (Oral)  Resp 22  SpO2 100%  Physical Exam General: Well-developed, well-nourished female in no acute distress; appearance consistent with age of record HENT: normocephalic, atraumatic Eyes: Left pupil round reactive to light, right pupil irregular status post surgery; extraocular muscles intact; arcus senilis bilaterally, right greater than left Neck: supple Heart: regular rate and rhythm Lungs: clear to auscultation  bilaterally Abdomen: soft; nontender; nondistended Extremities: No deformity; full range of motion Neurologic: Awake, alert;motor function intact in all extremities and symmetric; no facial droop; normal coordination and speech; no nystagmus Skin: Warm and dry Psychiatric: Flat affect    ED Course  Procedures (including critical care time)    MDM  EKG Interpretation:  Date & Time: 02/10/2011 3:23 AM  Rate: 69  Rhythm: normal sinus rhythm  QRS Axis: normal  Intervals: normal  ST/T Wave abnormalities: normal  Conduction Disutrbances:right bundle branch block  Narrative Interpretation: Left ventricular hypertrophy  Old EKG Reviewed: QRS morphology has changed  5:11 AM Patient sleeping but readily arousable after receiving Benadryl IV. Patient cannot sit up and not experience vertigo. Her daughter will take her home. We'll prescribe meclizine. Her daughter is familiar with vertigo in other relatives.           Hanley Seamen, MD 02/10/11 [phone]

## 2011-02-10 NOTE — ED Notes (Signed)
Pt came to the ED because she was feeling dizzy and then fell. According to EMS, patient did not hit her head. She has been complaining of nausea. Zofran was given by EMS. She is currently nauseated, but not feeling dizzy. She has not had a headache.She has not had a fever. No cough or SOB. Will continue to monitor.

## 2011-02-10 NOTE — ED Notes (Signed)
Arrived with [facility]. According to EMS, patient was becoming dizzy and fell to the ground. NO LOC. Pt is nauseated. 20g IV in LAC. She has had 4mg  Zofran. Attempted orthostatics because she became too dizzy with standing. She is not complaining of any CP and abdominal pain. EKG in EMS R bundle branch block. Hx of HTN. Initial was 169/119. Between 160s-180s and 110-120. CBG is 110. Daughter say hx of CVA. Neuro WNL. Pt is on 2L O2 N/C at home. 99-100% on 2L.

## 2011-08-23 DIAGNOSIS — I5042 Chronic combined systolic (congestive) and diastolic (congestive) heart failure: Secondary | ICD-10-CM

## 2011-08-23 HISTORY — DX: Chronic combined systolic (congestive) and diastolic (congestive) heart failure: I50.42

## 2011-09-06 ENCOUNTER — Emergency Department (HOSPITAL_COMMUNITY): Payer: Medicare Other

## 2011-09-06 ENCOUNTER — Inpatient Hospital Stay (HOSPITAL_COMMUNITY)
Admission: EM | Admit: 2011-09-06 | Discharge: 2011-09-16 | DRG: 286 | Disposition: A | Discharge status: Skilled Nursing Facility | Payer: Medicare Other | Attending: Internal Medicine | Admitting: Internal Medicine

## 2011-09-06 ENCOUNTER — Encounter (HOSPITAL_COMMUNITY): Payer: Self-pay | Admitting: Emergency Medicine

## 2011-09-06 DIAGNOSIS — N184 Chronic kidney disease, stage 4 (severe): Secondary | ICD-10-CM | POA: Diagnosis present

## 2011-09-06 DIAGNOSIS — E785 Hyperlipidemia, unspecified: Secondary | ICD-10-CM | POA: Diagnosis present

## 2011-09-06 DIAGNOSIS — N179 Acute kidney failure, unspecified: Secondary | ICD-10-CM

## 2011-09-06 DIAGNOSIS — E119 Type 2 diabetes mellitus without complications: Secondary | ICD-10-CM

## 2011-09-06 DIAGNOSIS — F17200 Nicotine dependence, unspecified, uncomplicated: Secondary | ICD-10-CM

## 2011-09-06 DIAGNOSIS — Z91199 Patient's noncompliance with other medical treatment and regimen due to unspecified reason: Secondary | ICD-10-CM

## 2011-09-06 DIAGNOSIS — I5043 Acute on chronic combined systolic (congestive) and diastolic (congestive) heart failure: Secondary | ICD-10-CM | POA: Diagnosis present

## 2011-09-06 DIAGNOSIS — E669 Obesity, unspecified: Secondary | ICD-10-CM

## 2011-09-06 DIAGNOSIS — I639 Cerebral infarction, unspecified: Secondary | ICD-10-CM | POA: Diagnosis not present

## 2011-09-06 DIAGNOSIS — I110 Hypertensive heart disease with heart failure: Secondary | ICD-10-CM | POA: Diagnosis present

## 2011-09-06 DIAGNOSIS — I428 Other cardiomyopathies: Secondary | ICD-10-CM | POA: Diagnosis present

## 2011-09-06 DIAGNOSIS — N189 Chronic kidney disease, unspecified: Secondary | ICD-10-CM

## 2011-09-06 DIAGNOSIS — I2789 Other specified pulmonary heart diseases: Secondary | ICD-10-CM

## 2011-09-06 DIAGNOSIS — I10 Essential (primary) hypertension: Secondary | ICD-10-CM

## 2011-09-06 DIAGNOSIS — E1159 Type 2 diabetes mellitus with other circulatory complications: Secondary | ICD-10-CM | POA: Diagnosis present

## 2011-09-06 DIAGNOSIS — I2510 Atherosclerotic heart disease of native coronary artery without angina pectoris: Secondary | ICD-10-CM

## 2011-09-06 DIAGNOSIS — E662 Morbid (severe) obesity with alveolar hypoventilation: Secondary | ICD-10-CM | POA: Diagnosis present

## 2011-09-06 DIAGNOSIS — I429 Cardiomyopathy, unspecified: Secondary | ICD-10-CM | POA: Diagnosis present

## 2011-09-06 DIAGNOSIS — G4733 Obstructive sleep apnea (adult) (pediatric): Secondary | ICD-10-CM

## 2011-09-06 DIAGNOSIS — I5021 Acute systolic (congestive) heart failure: Secondary | ICD-10-CM | POA: Diagnosis present

## 2011-09-06 DIAGNOSIS — Z8673 Personal history of transient ischemic attack (TIA), and cerebral infarction without residual deficits: Secondary | ICD-10-CM

## 2011-09-06 DIAGNOSIS — I2589 Other forms of chronic ischemic heart disease: Secondary | ICD-10-CM | POA: Diagnosis present

## 2011-09-06 DIAGNOSIS — J449 Chronic obstructive pulmonary disease, unspecified: Secondary | ICD-10-CM

## 2011-09-06 DIAGNOSIS — J4489 Other specified chronic obstructive pulmonary disease: Secondary | ICD-10-CM | POA: Diagnosis present

## 2011-09-06 DIAGNOSIS — J9602 Acute respiratory failure with hypercapnia: Secondary | ICD-10-CM | POA: Diagnosis present

## 2011-09-06 DIAGNOSIS — I279 Pulmonary heart disease, unspecified: Secondary | ICD-10-CM | POA: Diagnosis present

## 2011-09-06 DIAGNOSIS — Z9981 Dependence on supplemental oxygen: Secondary | ICD-10-CM

## 2011-09-06 DIAGNOSIS — J9620 Acute and chronic respiratory failure, unspecified whether with hypoxia or hypercapnia: Secondary | ICD-10-CM | POA: Diagnosis present

## 2011-09-06 DIAGNOSIS — I209 Angina pectoris, unspecified: Secondary | ICD-10-CM | POA: Diagnosis present

## 2011-09-06 DIAGNOSIS — I509 Heart failure, unspecified: Secondary | ICD-10-CM

## 2011-09-06 DIAGNOSIS — N289 Disorder of kidney and ureter, unspecified: Secondary | ICD-10-CM

## 2011-09-06 DIAGNOSIS — Z9119 Patient's noncompliance with other medical treatment and regimen: Secondary | ICD-10-CM

## 2011-09-06 DIAGNOSIS — I130 Hypertensive heart and chronic kidney disease with heart failure and stage 1 through stage 4 chronic kidney disease, or unspecified chronic kidney disease: Principal | ICD-10-CM | POA: Diagnosis present

## 2011-09-06 HISTORY — DX: Hypertensive heart disease with heart failure: I11.0

## 2011-09-06 HISTORY — DX: Gout, unspecified: M10.9

## 2011-09-06 HISTORY — DX: Cerebral infarction, unspecified: I63.9

## 2011-09-06 HISTORY — DX: Chronic obstructive pulmonary disease, unspecified: J44.9

## 2011-09-06 HISTORY — DX: Pulmonary heart disease, unspecified: I27.9

## 2011-09-06 HISTORY — DX: Hyperlipidemia, unspecified: E78.5

## 2011-09-06 HISTORY — DX: Chronic kidney disease, stage 4 (severe): N18.4

## 2011-09-06 HISTORY — DX: Obesity, unspecified: E66.9

## 2011-09-06 LAB — HEMOGLOBIN A1C: Hgb A1c MFr Bld: 4.6 % (ref <5.7)

## 2011-09-06 LAB — CBC
Platelets: 183 10*3/uL (ref 150–400)
RBC: 4.93 MIL/uL (ref 3.87–5.11)
WBC: 6.9 10*3/uL (ref 4.0–10.5)

## 2011-09-06 LAB — GLUCOSE, CAPILLARY: Glucose-Capillary: 103 mg/dL — ABNORMAL HIGH (ref 70–99)

## 2011-09-06 LAB — COMPREHENSIVE METABOLIC PANEL
Albumin: 2.5 g/dL — ABNORMAL LOW (ref 3.5–5.2)
BUN: 24 mg/dL — ABNORMAL HIGH (ref 6–23)
Calcium: 8.8 mg/dL (ref 8.4–10.5)
Creatinine, Ser: 2.29 mg/dL — ABNORMAL HIGH (ref 0.50–1.10)
Total Bilirubin: 0.4 mg/dL (ref 0.3–1.2)
Total Protein: 6 g/dL (ref 6.0–8.3)

## 2011-09-06 LAB — BASIC METABOLIC PANEL
BUN: 25 mg/dL — ABNORMAL HIGH (ref 6–23)
Chloride: 106 mEq/L (ref 96–112)
Creatinine, Ser: 2.19 mg/dL — ABNORMAL HIGH (ref 0.50–1.10)
GFR calc Af Amer: 25 mL/min — ABNORMAL LOW (ref >90)
GFR calc non Af Amer: 21 mL/min — ABNORMAL LOW (ref >90)
Glucose, Bld: 83 mg/dL (ref 70–99)

## 2011-09-06 LAB — CBC WITH DIFFERENTIAL/PLATELET
Basophils Relative: 0 % (ref 0–1)
Eosinophils Absolute: 0.1 10*3/uL (ref 0.0–0.7)
HCT: 40.9 % (ref 36.0–46.0)
Hemoglobin: 12.1 g/dL (ref 12.0–15.0)
MCH: 24.8 pg — ABNORMAL LOW (ref 26.0–34.0)
MCHC: 29.6 g/dL — ABNORMAL LOW (ref 30.0–36.0)
Monocytes Absolute: 0.5 10*3/uL (ref 0.1–1.0)
Monocytes Relative: 8 % (ref 3–12)

## 2011-09-06 LAB — URINALYSIS, ROUTINE W REFLEX MICROSCOPIC
Glucose, UA: 100 mg/dL — AB
Specific Gravity, Urine: 1.026 (ref 1.005–1.030)
Urobilinogen, UA: 1 mg/dL (ref 0.0–1.0)

## 2011-09-06 LAB — URINE MICROSCOPIC-ADD ON

## 2011-09-06 LAB — TSH: TSH: 1.755 u[IU]/mL (ref 0.350–4.500)

## 2011-09-06 MED ORDER — ACETAMINOPHEN 325 MG PO TABS
650.0000 mg | ORAL_TABLET | Freq: Four times a day (QID) | ORAL | Status: DC | PRN
  Administered 2011-09-12: 650 mg via ORAL
  Filled 2011-09-06: qty 2

## 2011-09-06 MED ORDER — ALBUTEROL SULFATE (5 MG/ML) 0.5% IN NEBU: 2.5000 mg | INHALATION_SOLUTION | RESPIRATORY_TRACT | Status: DC | PRN

## 2011-09-06 MED ORDER — FENOFIBRATE 54 MG PO TABS
54.0000 mg | ORAL_TABLET | Freq: Every day | ORAL | Status: DC
  Administered 2011-09-06 – 2011-09-07 (×2): 54 mg via ORAL
  Filled 2011-09-06 (×2): qty 1

## 2011-09-06 MED ORDER — ALBUTEROL SULFATE (5 MG/ML) 0.5% IN NEBU
2.5000 mg | INHALATION_SOLUTION | Freq: Four times a day (QID) | RESPIRATORY_TRACT | Status: DC
  Administered 2011-09-06 – 2011-09-07 (×3): 2.5 mg via RESPIRATORY_TRACT
  Filled 2011-09-06 (×5): qty 0.5

## 2011-09-06 MED ORDER — FUROSEMIDE 10 MG/ML IJ SOLN
40.0000 mg | Freq: Once | INTRAMUSCULAR | Status: AC
  Administered 2011-09-06: 40 mg via INTRAVENOUS
  Filled 2011-09-06: qty 4

## 2011-09-06 MED ORDER — ENOXAPARIN SODIUM 40 MG/0.4ML ~~LOC~~ SOLN
40.0000 mg | SUBCUTANEOUS | Status: DC
  Administered 2011-09-06: 40 mg via SUBCUTANEOUS
  Filled 2011-09-06 (×2): qty 0.4

## 2011-09-06 MED ORDER — GUAIFENESIN-DM 100-10 MG/5ML PO SYRP
5.0000 mL | ORAL_SOLUTION | ORAL | Status: DC | PRN
  Filled 2011-09-06: qty 5

## 2011-09-06 MED ORDER — SODIUM CHLORIDE 0.9 % IV SOLN: 250.0000 mL | INTRAVENOUS | Status: DC | PRN

## 2011-09-06 MED ORDER — AMLODIPINE BESYLATE 10 MG PO TABS
10.0000 mg | ORAL_TABLET | Freq: Every day | ORAL | Status: DC
  Administered 2011-09-06 – 2011-09-07 (×2): 10 mg via ORAL
  Filled 2011-09-06 (×2): qty 1

## 2011-09-06 MED ORDER — SODIUM CHLORIDE 0.9 % IJ SOLN
3.0000 mL | Freq: Two times a day (BID) | INTRAMUSCULAR | Status: DC
  Administered 2011-09-06 – 2011-09-10 (×4): 3 mL via INTRAVENOUS
  Administered 2011-09-10: 22:00:00 via INTRAVENOUS
  Administered 2011-09-11 – 2011-09-12 (×3): 3 mL via INTRAVENOUS
  Administered 2011-09-13: 10:00:00 via INTRAVENOUS

## 2011-09-06 MED ORDER — HYDROMORPHONE HCL PF 1 MG/ML IJ SOLN: 0.5000 mg | INTRAMUSCULAR | Status: DC | PRN

## 2011-09-06 MED ORDER — INSULIN ASPART 100 UNIT/ML ~~LOC~~ SOLN
0.0000 [IU] | Freq: Three times a day (TID) | SUBCUTANEOUS | Status: DC
  Administered 2011-09-09 (×2): 2 [IU] via SUBCUTANEOUS
  Administered 2011-09-09: 3 [IU] via SUBCUTANEOUS

## 2011-09-06 MED ORDER — NICOTINE 21 MG/24HR TD PT24
21.0000 mg | MEDICATED_PATCH | Freq: Every day | TRANSDERMAL | Status: DC
  Administered 2011-09-06 – 2011-09-16 (×11): 21 mg via TRANSDERMAL
  Filled 2011-09-06 (×12): qty 1

## 2011-09-06 MED ORDER — SODIUM CHLORIDE 0.9 % IJ SOLN: 3.0000 mL | Freq: Two times a day (BID) | INTRAMUSCULAR | Status: DC

## 2011-09-06 MED ORDER — PARICALCITOL 1 MCG PO CAPS
1.0000 ug | ORAL_CAPSULE | Freq: Every day | ORAL | Status: DC
  Administered 2011-09-06 – 2011-09-16 (×11): 1 ug via ORAL
  Filled 2011-09-06 (×11): qty 1

## 2011-09-06 MED ORDER — LISINOPRIL 10 MG PO TABS
10.0000 mg | ORAL_TABLET | Freq: Every day | ORAL | Status: DC
  Administered 2011-09-06 – 2011-09-07 (×2): 10 mg via ORAL
  Filled 2011-09-06 (×2): qty 1

## 2011-09-06 MED ORDER — ONDANSETRON HCL 4 MG/2ML IJ SOLN: 4.0000 mg | Freq: Three times a day (TID) | INTRAMUSCULAR | Status: DC | PRN

## 2011-09-06 MED ORDER — SODIUM CHLORIDE 0.9 % IJ SOLN
3.0000 mL | INTRAMUSCULAR | Status: DC | PRN
  Administered 2011-09-07: 3 mL via INTRAVENOUS

## 2011-09-06 MED ORDER — IPRATROPIUM BROMIDE 0.02 % IN SOLN
0.5000 mg | Freq: Four times a day (QID) | RESPIRATORY_TRACT | Status: DC
  Administered 2011-09-06 – 2011-09-07 (×3): 0.5 mg via RESPIRATORY_TRACT
  Filled 2011-09-06 (×5): qty 2.5

## 2011-09-06 MED ORDER — ASPIRIN EC 81 MG PO TBEC
81.0000 mg | DELAYED_RELEASE_TABLET | Freq: Every day | ORAL | Status: DC
  Administered 2011-09-06 – 2011-09-16 (×10): 81 mg via ORAL
  Filled 2011-09-06 (×11): qty 1

## 2011-09-06 MED ORDER — FUROSEMIDE 8 MG/ML PO SOLN
40.0000 mg | Freq: Two times a day (BID) | ORAL | Status: DC
  Administered 2011-09-06 – 2011-09-07 (×3): 40 mg via ORAL
  Filled 2011-09-06 (×6): qty 5

## 2011-09-06 MED ORDER — ACETAMINOPHEN 650 MG RE SUPP: 650.0000 mg | Freq: Four times a day (QID) | RECTAL | Status: DC | PRN

## 2011-09-06 NOTE — Progress Notes (Signed)
Page MD on BP 170s/180s. MD said give sch med, monitor and page if BP above 180s

## 2011-09-06 NOTE — ED Notes (Signed)
Daughter and Hospitalist NP at bedside.

## 2011-09-06 NOTE — ED Notes (Signed)
Bed [place] given. Pt has very poor hygiene. Daughter at bedside and states pt is noncompliant with diet, taking her meds, and is secretly still smoking while on O2. Educated pt on the danger of all of this. States she understands and asks a couple of diet questions that were answered. Denies pain. States she is breathing better.

## 2011-09-06 NOTE — ED Notes (Signed)
Pt resting in stretcher with eyes closed. Easily arousable with soft verbal stimuli. Pulses remain palpable and strong. Denies CP. 3L O2 [place] remains on pt. 50cc of urine noted in collection chamber of foley bag since Lasix given. Skin w/d. Remains of cardiac monitor. ECG repeated due to noticed change in complexes and review by Dr Preston Fleeting without further orders.

## 2011-09-06 NOTE — ED Notes (Signed)
Left message with ED number for pt's daughter to make her aware pt was in ED. Pt spoke to her cousin Pam via phone

## 2011-09-06 NOTE — ED Notes (Signed)
Pt states she has been SOB for the past couple days but has become worse. Pt states she hasn't taken her blood pressure meds because she forgets. States she has her meds delivered, so she has them but forgets to take them. On arrival pt was on a NRB sating 100%, replaced with [place] at 3L and pt sating 93%. Skin w/d, resp e/u. Denies CP. 4 plus pitting edema noted to BLE

## 2011-09-06 NOTE — Progress Notes (Signed)
[name], is a 71 y.o. female,   MRN: [id number]  -  DOB - [birth year]  Outpatient Primary MD for the patient is Gwynneth Aliment, MD  in for    Chief Complaint  Patient presents with  . Shortness of Breath     Blood pressure 169/83, pulse 106, temperature 98.3 F (36.8 C), temperature source Axillary, resp. rate 28, SpO2 98.00%.  Active Problems:  DIABETES, TYPE 2  HYPERLIPIDEMIA  OBESITY  HYPERTENSION  C O P D  RENAL INSUFFICIENCY, CHRONIC  CVA (cerebral infarction)     71 yo female, hx COPD on home O2, HTN, CKD, DM, hx CVA,  presents to ED SOB. Work up in ED yields BNP 49,670, chest xray probable mild congestive heart failure,  Creatinine 2.1,(last documented 1.2 in 2010) elevated BP and resp rate 28 with sats 93% on 3L. Resp effort only mildly increased, 1-2+ pitting edema.   Lasix 40mg  IV given in ED with 75cc urine so far.     1. New CHF  etiology likely multifactorial i.e. Non-compliance meds, uncontrolled BP 2. Acute on chronic renal insuffinciency  3. HTN uncontrolled

## 2011-09-06 NOTE — ED Notes (Signed)
180/104 BP in route.

## 2011-09-06 NOTE — Progress Notes (Signed)
Page on tele monitor Abnormal. And EKG print off. Place EKG in chart

## 2011-09-06 NOTE — H&P (Signed)
History and Physical  [name] OZH:[id number] DOB: 1940-12-10 DOA: 09/06/2011  Referring physician: PCP: Gwynneth Aliment, MD   Chief Complaint: Shortness of breath  HPI:  Patient is a 71 year old African American female with past medical history significant for hypertension congestive heart failure, angina, diabetes dyslipidemia COPD and tobacco use. Patient presented to the emergency room secondary to increasing shortness of breath. Patient stated that shortness of breath has been getting worse over the past couple of weeks. She uses home oxygen and has been smoking with the oxygen. She smokes about a pack a day. She complains of a dry cough.  No fevers no chills she has not been really taking her medications. She's had no chest pain she does complain of increasing lower extremity edema.  Chart Review:  none  Review of Systems:  10 point review of systems negative otherwise stated in the history of present illness.  Past Medical History  Diagnosis Date  . Hypertension   . Stroke   . COPD (chronic obstructive pulmonary disease)     on home o2  . Chronic renal insufficiency   . Diabetes mellitus     type 2  . CVA (cerebral infarction)     hx    History reviewed. No pertinent past surgical history.  Social History:  Patient smokes one pack of cigarettes per day. She does not drink alcohol. No illicit drug use. She is widowed and  has 5 children. She is retired she lives alone.  Allergies  Allergen Reactions  . Sulfonamide Derivatives Hives and Rash     Mother died from a stroke and father died from heart attack.   Prior to Admission medications   Medication Sig Start Date End Date Taking? Authorizing Provider  amLODipine (NORVASC) 10 MG tablet Take 10 mg by mouth daily.   Yes Historical Provider, MD  fenofibrate (TRICOR) 145 MG tablet Take 145 mg by mouth daily.   Yes Historical Provider, MD  meclizine (ANTIVERT) 25 MG tablet Take 25 mg by mouth 3 (three) times  daily as needed. For dizziness   Yes Historical Provider, MD  OVER THE COUNTER MEDICATION Take 1 tablet by mouth daily. Basic Vitamin   Yes Historical Provider, MD  paricalcitol (ZEMPLAR) 1 MCG capsule Take 1 mcg by mouth daily.   Yes Historical Provider, MD   Physical Exam: Filed Vitals:   09/06/11 0930 09/06/11 0945 09/06/11 1000 09/06/11 1150  BP: 177/82 179/78 175/82 176/85  Pulse: 108 98 93 92  Temp:    97.5 F (36.4 C)  TempSrc:      Resp: 26 29 23 22   Height:    5\' 4"  (1.626 m)  Weight:    102.5 kg (225 lb 15.5 oz)  SpO2: 94% 98% 100% 100%     General:  The patient does not seem to be in any acute distress.  HEENT: Head normocephalic atraumatic. Pupils reactive to light  Cardiovascular: Regular rate rhythm  Respiratory: Decreased breath sounds at the bases. No wheezing  Abdomen: Soft obese nondistended positive bowel sounds  Skin: Intact  Psychiatric: Normal mood Neurologic: Not tested Extremities: 2+ edema bilateral  Labs on Admission:  Basic Metabolic Panel:  Lab 09/06/11 6295 09/06/11 0739  NA 143 143  K 4.2 4.4  CL 106 106  CO2 30 26  GLUCOSE 105* 83  BUN 24* 25*  CREATININE 2.29* 2.19*  CALCIUM 8.8 9.0  MG -- --  PHOS -- --    Liver Function Tests:  Lab 09/06/11 1258  AST 14  ALT 10  ALKPHOS 65  BILITOT 0.4  PROT 6.0  ALBUMIN 2.5*    CBC:  Lab 09/06/11 1258 09/06/11 0739  WBC 6.9 6.6  NEUTROABS -- 4.8  HGB 12.2 12.1  HCT 42.1 40.9  MCV 85.4 84.0  PLT 183 193    Cardiac Enzymes:  Lab 09/06/11 0739  CKTOTAL --  CKMB --  CKMBINDEX --  TROPONINI <0.30     BNP (last 3 results)  Basename 09/06/11 0740  PROBNP 49670.0*    CBG:  Lab 09/06/11 1143  GLUCAP 76     Radiological Exams on Admission: Dg Chest Portable 1 View  09/06/2011  *RADIOLOGY REPORT*  Clinical Data: Shortness of breath and weakness.  PORTABLE CHEST - 1 VIEW  Comparison: CT chest and chest radiograph 08/07/2008.  Findings: Trachea is midline.  Heart  is enlarged.  Thoracic aorta is calcified.  There is bibasilar air space disease with probable bilateral pleural effusions.  Mild interstitial prominence in the upper lung zones.  IMPRESSION: Probable mild congestive heart failure.  Follow-up to clearing is recommended.  Original Report Authenticated By: Reyes Ivan, M.D.    EKG: Independently reviewed.     Assessment/Plan CHF exacerbation BNP is elevated. Chest x-rays showed congestive heart failure. Patient has not been taking her medications. Patient placed on IV Lasix. Will monitor input output. 2-D echo ordered.   DIABETES, TYPE 2 Patient not sure of medications she takes for diabetes. Will put her sliding scale insulin and check hemoglobin A1c.    HYPERLIPIDEMIA Check fasting lipid panel   OBESITY Encourage diet and exercise.    HYPERTENSION Patient unsure of medication she takes for blood pressure. Will put her on ACE inhibitor and continue with the Lasix.   C O P D Nebulizer treatments scheduled and when necessary   RENAL INSUFFICIENCY, CHRONIC unsure of her baseline kidney function. She does have chronic kidney disease. Will monitor kidney function while she is being diuresed.    CVA (cerebral infarction) Patient has old stroke. Will ask PT OT to see her. She does use a cane to get around at home.  Time spent with patient and daughter doing this admission is approximately one hour.  Time spent with patient in doing this admission is approximately one hour. Code Status: Full code Family Communication: Discussed with patient and daughter at the bedside Disposition Plan: Not yet determined. Molli Posey, MD  [facility] Pager [phone] If 8PM-8AM, please contact floor/night-coverage at [email or web address], password [facility] 09/06/2011, 1:53 PM

## 2011-09-06 NOTE — ED Provider Notes (Signed)
History     CSN: [id number]  Arrival date & time 09/06/11  0709   First MD Initiated Contact with Patient 09/06/11 [phone]      Chief Complaint  Patient presents with  . Shortness of Breath    (Consider location/radiation/quality/duration/timing/severity/associated sxs/prior treatment) Patient is a 71 y.o. female presenting with shortness of breath. The history is provided by the patient and the EMS personnel. History Limited By: Very poor historian.  Shortness of Breath  Associated symptoms include shortness of breath.  She was brought in by EMS because of difficulty breathing. When asked why she was here she would only say that she doesn't feel good and when pressed said that she is having some difficulty of breathing. She says that breathing problems and present for 2 days and worsening but she would not answer questions regarding fever, chest pain, aggravating or mitigating factors. She is on home oxygen at 2 L. EMS reported initial oxygen saturation of 70% which came up to 100% when placed on a nonrebreather. She reportedly has not taken her blood pressure medicines for the last several days.  Past Medical History  Diagnosis Date  . Hypertension   . Stroke     History reviewed. No pertinent past surgical history.  No family history on file.  History  Substance Use Topics  . Smoking status: Not on file  . Smokeless tobacco: Not on file  . Alcohol Use: No    OB History    Grav Para Term Preterm Abortions TAB SAB Ect Mult Living                  Review of Systems  Unable to perform ROS: Other  Respiratory: Positive for shortness of breath.     Allergies  Sulfonamide derivatives  Home Medications  No current outpatient prescriptions on file.  BP 192/93  Pulse 100  Temp 98.3 F (36.8 C) (Axillary)  Resp 24  SpO2 100%  Physical Exam  Nursing note and vitals reviewed.  71 year old female who appears mildly dyspneic but is in no acute distress. Vital signs are  significant for tachypnea with respiratory rate of 24, and hypertension with blood pressure 192/93. Oxygen saturation is 100% which is normal. Head is normocephalic and atraumatic. PERRLA, EOMI. Oropharynx is clear. Neck is nontender and supple without adenopathy or JVD. Back is nontender. Lungs have decreased breath sounds at the right base and bibasilar rales are present. There no wheezes or rhonchi. Heart has regular rate and rhythm without murmur. Abdomen is soft, flat, nontender without masses or hepatosplenomegaly. Extremities have 3+ edema, no cyanosis. Full range of motion is present. Skin is warm and dry without rash. Neurologic, and she is awake and alert. Cranial nerves are intact. There are no motor or sensory deficits.  ED Course  Procedures (including critical care time)  Results for orders placed during the hospital encounter of 09/06/11  CBC WITH DIFFERENTIAL      Component Value Range   WBC 6.6  4.0 - 10.5 K/uL   RBC 4.87  3.87 - 5.11 MIL/uL   Hemoglobin 12.1  12.0 - 15.0 g/dL   HCT 11.9  14.7 - 82.9 %   MCV 84.0  78.0 - 100.0 fL   MCH 24.8 (*) 26.0 - 34.0 pg   MCHC 29.6 (*) 30.0 - 36.0 g/dL   RDW 56.2 (*) 13.0 - 86.5 %   Platelets 193  150 - 400 K/uL   Neutrophils Relative 72  43 - 77 %  Neutro Abs 4.8  1.7 - 7.7 K/uL   Lymphocytes Relative 20  12 - 46 %   Lymphs Abs 1.3  0.7 - 4.0 K/uL   Monocytes Relative 8  3 - 12 %   Monocytes Absolute 0.5  0.1 - 1.0 K/uL   Eosinophils Relative 1  0 - 5 %   Eosinophils Absolute 0.1  0.0 - 0.7 K/uL   Basophils Relative 0  0 - 1 %   Basophils Absolute 0.0  0.0 - 0.1 K/uL  BASIC METABOLIC PANEL      Component Value Range   Sodium 143  135 - 145 mEq/L   Potassium 4.4  3.5 - 5.1 mEq/L   Chloride 106  96 - 112 mEq/L   CO2 26  19 - 32 mEq/L   Glucose, Bld 83  70 - 99 mg/dL   BUN 25 (*) 6 - 23 mg/dL   Creatinine, Ser 0.98 (*) 0.50 - 1.10 mg/dL   Calcium 9.0  8.4 - 11.9 mg/dL   GFR calc non Af Amer 21 (*) >90 mL/min   GFR calc Af  Amer 25 (*) >90 mL/min  PRO B NATRIURETIC PEPTIDE      Component Value Range   Pro B Natriuretic peptide (BNP) 49670.0 (*) 0 - 125 pg/mL  TROPONIN I      Component Value Range   Troponin I <0.30  <0.30 ng/mL   Dg Chest Portable 1 View  09/06/2011  *RADIOLOGY REPORT*  Clinical Data: Shortness of breath and weakness.  PORTABLE CHEST - 1 VIEW  Comparison: CT chest and chest radiograph 08/07/2008.  Findings: Trachea is midline.  Heart is enlarged.  Thoracic aorta is calcified.  There is bibasilar air space disease with probable bilateral pleural effusions.  Mild interstitial prominence in the upper lung zones.  IMPRESSION: Probable mild congestive heart failure.  Follow-up to clearing is recommended.  Original Report Authenticated By: Reyes Ivan, M.D.   Image viewed by me.    Date: 09/06/2011  Rate: 97  Rhythm: normal sinus rhythm  QRS Axis: left  Intervals: QT prolonged  ST/T Wave abnormalities: J-point depression and T wave inversion in the anterior and lateral leads  Conduction Disutrbances:right bundle branch block  Narrative Interpretation: Left atrial hypertrophy, right bundle branch block, left ventricular hypertrophy with secondary repolarization changes. When compared with ECG of 02/10/2011, no significant changes are seen  Old EKG Reviewed: unchanged    1. CHF (congestive heart failure)   2. Renal insufficiency       MDM  Dyspnea with significant peripheral edema the most likely represents CHF exacerbation. Initial hypoxia has improved since he is maintaining adequate oxygen saturations with nasal cannula. Workup has been initiated including chest x-ray and laboratory workup. Her records of been reviewed and she has no recent ED visits or hospitalizations.  Workup is consistent with congestive heart failure. She's given a dose of furosemide intravenously. Case is discussed with Clydie Braun black [facility] who agrees to admit the patient.     Dione Booze,  MD 09/06/11 [phone]

## 2011-09-06 NOTE — Evaluation (Addendum)
Physical Therapy Evaluation Patient Details Name: [name] MRN: [id number] DOB: [birth year] Today's Date: 09/06/2011 Time: 4098-1191 PT Time Calculation (min): 37 min  PT Assessment / Plan / Recommendation Clinical Impression  Patient presented to ED with CHF. She is unco-operative and non compliant with her treatment at home and has poor motivation to mobilize unless there is a secondary need - e.g using the toilet. I anticipate that she will continue to decline if she returns home as she does not appear to care about her condition. For this reason I recommend SNF at discharge as patient does not have 24 hour care.     PT Assessment  Patient needs continued PT services    Follow Up Recommendations  Skilled nursing facility    Barriers to Discharge  None      Equipment Recommendations  Defer to next venue    Recommendations for Other Services  None   Frequency Min 3X/week    Precautions / Restrictions Precautions Precautions: Fall Restrictions Weight Bearing Restrictions: No   Pertinent Vitals/Pain VSS/ No pain      Mobility  Bed Mobility Bed Mobility: Supine to Sit;Sitting - Scoot to Edge of Bed Supine to Sit: 2: Max assist Sitting - Scoot to [place] of Bed: 3: Mod assist Details for Bed Mobility Assistance: Patient scooting self to edge of bed once seated. Patient very unco-operative Transfers Transfers: Sit to Stand;Stand to [employer] Transfers Sit to Stand: 3: Mod assist;With upper extremity assist;From bed Stand to Sit: 4: Min assist;With upper extremity assist;To chair/3-in-1 Stand Pivot Transfers: 4: Min guard Details for Transfer Assistance: First stand total assist and patient resists moving/no effort. Then patient required assistance from elevated bed, but when wanted to use toilet she stood without assistance from the chair and pivotted without difficulty or assistance to the [facility].      PT Diagnosis: Difficulty walking;Generalized weakness  PT  Problem List: Decreased strength;Decreased activity tolerance;Decreased balance;Decreased mobility;Decreased knowledge of precautions;Decreased knowledge of use of DME;Obesity PT Treatment Interventions: Gait training;Therapeutic activities;Therapeutic exercise;Balance training;Patient/family education   PT Goals Acute Rehab PT Goals PT Goal Formulation: With patient/family Time For Goal Achievement: 09/13/11 Potential to Achieve Goals: Good Pt will go Supine/Side to Sit: with modified independence PT Goal: Supine/Side to Sit - Progress: Goal set today Pt will go Sit to Supine/Side: with modified independence PT Goal: Sit to Supine/Side - Progress: Goal set today Pt will go Sit to Stand: with modified independence PT Goal: Sit to Stand - Progress: Goal set today Pt will go Stand to Sit: with modified independence PT Goal: Stand to Sit - Progress: Goal set today Pt will Ambulate: 16 - 50 feet;with modified independence;with least restrictive assistive device PT Goal: Ambulate - Progress: Goal set today  Visit Information  Last PT Received On: 09/06/11 Assistance Needed: +1    Subjective Data  Subjective: Patient did not converse with PT until her she needed to communicate so they knw what she wanted or she would have to make her decisions. Patient then stated "no-one makes ny decisions". Patient also asked to use bathroom once up in chair. Then once on the [facility] stated no-one can be in here while I use the toilet Patient Stated Goal: Unknown   Prior Functioning  Home Living Lives With: Alone Available Help at Discharge: Family;Available PRN/intermittently (Daughter works) Type of Home: House Home Access: Ramped entrance Home Layout: One level Bathroom Shower/Tub: Network engineer: Straight cane;Tub transfer bench Additional Comments: Patient  is non-compliant with medications and treatments at home per daughter. She can ambulate no  more than approx 10 feet and this can take 5 minutes. Gilmer Mor is bent clearly due to increased weight bearing on the cane and so she will likely need a walker to ambulate. Prior Function Level of Independence: Needs assistance Needs Assistance: Bathing;Dressing;Meal Prep;Light Housekeeping Bath: Moderate Dressing: Moderate Meal Prep: Total Light Housekeeping: Total Comments: Sponge bath qod alternate days into tub. Communication Communication: No difficulties    Cognition  Overall Cognitive Status: Difficult to assess Arousal/Alertness: Awake/alert Orientation Level: Appears intact for tasks assessed Cognition - Other Comments: Difficult to assess secondary to patient relatively non-communicative. Does not respond to questions clearly ignoring PT. Poor motivation to help self or move unless she wants to.    Extremity/Trunk Assessment Right Lower Extremity Assessment RLE ROM/Strength/Tone: Deficits RLE ROM/Strength/Tone Deficits: Grossly 4/5 RLE Coordination: WFL - gross/fine motor Left Lower Extremity Assessment LLE ROM/Strength/Tone: Deficits LLE ROM/Strength/Tone Deficits: Grossly 4/5 LLE Coordination: WFL - gross/fine motor Trunk Assessment Trunk Assessment: Kyphotic   Balance Balance Balance Assessed: Yes Static Sitting Balance Static Sitting - Balance Support: No upper extremity supported Static Sitting - Level of Assistance: 5: Stand by assistance Static Sitting - Comment/# of Minutes: approx 15 minutes with patient ignoring PT and daughter and not attempting to ambulate or stand.   End of Session PT - End of Session Equipment Utilized During Treatment: Gait belt Activity Tolerance:  (Self limits) Patient left:  (on [facility]) Nurse Communication: Mobility status   Edwyna Perfect, PT  Pager [phone]  09/06/2011, 2:34 PM

## 2011-09-06 NOTE — ED Notes (Signed)
PER EMS- Patient called EMS this morning after waking up SOB. Patient has known history of COPD, wear continuous 02 at home. Fire Department states O2 was at 70% when they arrived. Placed on Non re-breather. Pt currently 100% on 15L. History HTN, asthma, diabetic, and cardiac disease. Alertx4, NAD at this moment. Allergic to Sulfa. CBG- 98

## 2011-09-07 ENCOUNTER — Encounter (HOSPITAL_COMMUNITY): Payer: Self-pay | Admitting: *Deleted

## 2011-09-07 ENCOUNTER — Other Ambulatory Visit: Payer: Self-pay

## 2011-09-07 ENCOUNTER — Inpatient Hospital Stay (HOSPITAL_COMMUNITY): Payer: Medicare Other

## 2011-09-07 DIAGNOSIS — N184 Chronic kidney disease, stage 4 (severe): Secondary | ICD-10-CM

## 2011-09-07 DIAGNOSIS — I429 Cardiomyopathy, unspecified: Secondary | ICD-10-CM | POA: Diagnosis present

## 2011-09-07 DIAGNOSIS — I5021 Acute systolic (congestive) heart failure: Secondary | ICD-10-CM | POA: Diagnosis present

## 2011-09-07 DIAGNOSIS — E1159 Type 2 diabetes mellitus with other circulatory complications: Secondary | ICD-10-CM

## 2011-09-07 DIAGNOSIS — M109 Gout, unspecified: Secondary | ICD-10-CM

## 2011-09-07 DIAGNOSIS — I452 Bifascicular block: Secondary | ICD-10-CM | POA: Insufficient documentation

## 2011-09-07 DIAGNOSIS — J9602 Acute respiratory failure with hypercapnia: Secondary | ICD-10-CM | POA: Diagnosis present

## 2011-09-07 DIAGNOSIS — I798 Other disorders of arteries, arterioles and capillaries in diseases classified elsewhere: Secondary | ICD-10-CM

## 2011-09-07 HISTORY — DX: Gout, unspecified: M10.9

## 2011-09-07 LAB — BLOOD GAS, ARTERIAL
Drawn by: 222511
O2 Content: 2 L/min
pCO2 arterial: 77.1 mmHg (ref 35.0–45.0)
pO2, Arterial: 57.6 mmHg — ABNORMAL LOW (ref 80.0–100.0)

## 2011-09-07 LAB — GLUCOSE, CAPILLARY
Glucose-Capillary: 89 mg/dL (ref 70–99)
Glucose-Capillary: 85 mg/dL (ref 70–99)
Glucose-Capillary: 82 mg/dL (ref 70–99)

## 2011-09-07 LAB — POCT I-STAT 3, ART BLOOD GAS (G3+)
Acid-Base Excess: 5 mmol/L — ABNORMAL HIGH (ref 0.0–2.0)
Bicarbonate: 32.9 mEq/L — ABNORMAL HIGH (ref 20.0–24.0)
O2 Saturation: 97 %
Patient temperature: 98.6
TCO2: 35 mmol/L (ref 0–100)
pO2, Arterial: 109 mmHg — ABNORMAL HIGH (ref 80.0–100.0)

## 2011-09-07 LAB — BASIC METABOLIC PANEL
BUN: 25 mg/dL — ABNORMAL HIGH (ref 6–23)
GFR calc Af Amer: 24 mL/min — ABNORMAL LOW (ref >90)
GFR calc non Af Amer: 21 mL/min — ABNORMAL LOW (ref >90)
Potassium: 4.1 mEq/L (ref 3.5–5.1)
Sodium: 144 mEq/L (ref 135–145)

## 2011-09-07 LAB — LIPID PANEL
Cholesterol: 255 mg/dL — ABNORMAL HIGH (ref 0–200)
Triglycerides: 177 mg/dL — ABNORMAL HIGH (ref <150)
VLDL: 35 mg/dL (ref 0–40)

## 2011-09-07 LAB — URINE CULTURE
Colony Count: NO GROWTH
Culture: NO GROWTH

## 2011-09-07 LAB — CARDIAC PANEL(CRET KIN+CKTOT+MB+TROPI)
CK, MB: 3.1 ng/mL (ref 0.3–4.0)
Relative Index: INVALID (ref 0.0–2.5)
Total CK: 44 U/L (ref 7–177)

## 2011-09-07 LAB — MAGNESIUM: Magnesium: 1.3 mg/dL — ABNORMAL LOW (ref 1.5–2.5)

## 2011-09-07 LAB — COMPREHENSIVE METABOLIC PANEL
AST: 13 U/L (ref 0–37)
Albumin: 2.1 g/dL — ABNORMAL LOW (ref 3.5–5.2)
BUN: 24 mg/dL — ABNORMAL HIGH (ref 6–23)
Creatinine, Ser: 2.29 mg/dL — ABNORMAL HIGH (ref 0.50–1.10)
Potassium: 4.1 mEq/L (ref 3.5–5.1)
Total Protein: 5.4 g/dL — ABNORMAL LOW (ref 6.0–8.3)

## 2011-09-07 LAB — CBC
HCT: 39.3 % (ref 36.0–46.0)
Hemoglobin: 11.5 g/dL — ABNORMAL LOW (ref 12.0–15.0)
MCHC: 28 g/dL — ABNORMAL LOW (ref 30.0–36.0)
MCHC: 28.8 g/dL — ABNORMAL LOW (ref 30.0–36.0)
Platelets: 163 10*3/uL (ref 150–400)
RDW: 18.2 % — ABNORMAL HIGH (ref 11.5–15.5)
RDW: 18.3 % — ABNORMAL HIGH (ref 11.5–15.5)

## 2011-09-07 MED ORDER — ENOXAPARIN SODIUM 30 MG/0.3ML ~~LOC~~ SOLN
30.0000 mg | SUBCUTANEOUS | Status: DC
  Filled 2011-09-07: qty 0.3

## 2011-09-07 MED ORDER — ATORVASTATIN CALCIUM 10 MG PO TABS
10.0000 mg | ORAL_TABLET | Freq: Every day | ORAL | Status: DC
  Administered 2011-09-07 – 2011-09-16 (×10): 10 mg via ORAL
  Filled 2011-09-07 (×11): qty 1

## 2011-09-07 MED ORDER — LISINOPRIL 20 MG PO TABS: 20.0000 mg | ORAL_TABLET | Freq: Every day | ORAL | Status: DC

## 2011-09-07 MED ORDER — AMLODIPINE BESYLATE 5 MG PO TABS
5.0000 mg | ORAL_TABLET | Freq: Every day | ORAL | Status: DC
  Administered 2011-09-08 – 2011-09-10 (×3): 5 mg via ORAL
  Filled 2011-09-07 (×3): qty 1

## 2011-09-07 MED ORDER — IPRATROPIUM BROMIDE 0.02 % IN SOLN: 0.5000 mg | Freq: Four times a day (QID) | RESPIRATORY_TRACT | Status: DC | PRN

## 2011-09-07 MED ORDER — CARVEDILOL 3.125 MG PO TABS
3.1250 mg | ORAL_TABLET | Freq: Two times a day (BID) | ORAL | Status: DC
  Administered 2011-09-07 – 2011-09-09 (×4): 3.125 mg via ORAL
  Filled 2011-09-07 (×6): qty 1

## 2011-09-07 MED ORDER — HEPARIN SODIUM (PORCINE) 5000 UNIT/ML IJ SOLN
5000.0000 [IU] | Freq: Three times a day (TID) | INTRAMUSCULAR | Status: DC
  Administered 2011-09-08 – 2011-09-16 (×26): 5000 [IU] via SUBCUTANEOUS
  Filled 2011-09-07 (×31): qty 1

## 2011-09-07 NOTE — Progress Notes (Signed)
Dr. Donnie Aho came to see patient earlier in the day and felt she was very lethargic and not responding to him. For me she was lethargic but responsive enough to let me know she needed to use the bathroom and get up to the [facility]. Dr. Earlene Plater then ordered a stat ABG which can be noted in the results review. Pulmonology was consulted. They directed [place] to move her to ICU. Rapid response was called and we moved the patient to 2114. Cj Beecher, Melida Quitter

## 2011-09-07 NOTE — Progress Notes (Signed)
[facility] PROGRESS NOTE  [name] WUJ:[id number] DOB: 1940/05/22 DOA: 09/06/2011 PCP: Gwynneth Aliment, MD  Assessment/Plan: Congestive heart failure (CHF) exacerbation(new) Patient came in with pulmonary edema BNP elevated. Echocardiogram done in 2010 showed normal pumping function. Echo done today showed LVEF of 25-30% with akinesis of the entire anteroseptal myocardium. Will continue with diuretic. Added Coreg,Lisinopril and aspirin. Cardiology consulted.  Type 2 diabetes mellitus with vascular disease Blood sugars have been well-controlled on sliding scale insulin. Continue sliding scale insulin.  Hyperlipdemia Started patient on low-dose statin medication.  OBESITY Dietitian consult to help with weight loss.  HYPERTENSION Blood pressure slightly elevated. As mentioned above lisinopril and Coreg added to the diuretic.   C O P D Patient is on oxygen nasal cannula and nebulizer treatments.   Chronic kidney disease (CKD), stage IV (severe) Patient with chronic kidney disease. Will monitor kidney function while patient is being diuresed.  Tobacco Use  Patient continues to smoke even though she is on nasal cannula Oxygen. Discussed with patient smoking cessation.   Brief narrative: Patient is a 71 year old African American female that lives alone and does noncompliant with her medications. Patient presented with shortness of breath for the past couple weeks. She states that she has not been really taking her medications. She had 2-D echo done that shows congestive heart failure that's new. Cardiology has been consulted. Patient was also evaluated by physical therapy and the recommendation is short-term skilled nursing facility.  Consultants:  Cardiologist -Dr. Donnie Aho  Procedures Left ventricle: The cavity size was mildly dilated. There was moderate concentric hypertrophy. Systolic function was severely reduced. The estimated ejection fraction was in the  range of 25% to 30%. There is akinesis of the entireanteroseptal myocardium; consistent with infarction. Doppler parameters are consistent with a reversible restrictive pattern, indicative of decreased left ventricular diastolic compliance and/or increased left atrial pressure (grade 3 diastolic dysfunction). - Mitral valve: Moderately calcified annulus. Mild regurgitation.- Left atrium: The atrium was severely dilated.- Right ventricle: The cavity size was moderately dilated. Systolic function was moderately reduced.- Right atrium: The atrium was severely dilated.- Pulmonary arteries: Systolic pressure was severely increased. PA peak pressure: 92mm Hg (S   Antibiotics:  None ?  HPI/Subjective: Patient is very sleepy today. Her daughter is at the bedside.  Objective: Filed Vitals:   09/06/11 2200 09/07/11 0334 09/07/11 0500 09/07/11 1140  BP: 142/70  162/74 158/67  Pulse: 103  79 80  Temp: 97.9 F (36.6 C)  97.6 F (36.4 C) 98.9 F (37.2 C)  TempSrc: Oral  Oral Oral  Resp: 22  20 22   Height:      Weight:   100.154 kg (220 lb 12.8 oz)   SpO2: 98% 97% 95% 98%    Intake/Output Summary (Last 24 hours) at 09/07/11 1348 Last data filed at 09/07/11 1252  Gross per 24 hour  Intake    660 ml  Output   1250 ml  Net   -590 ml    Exam:   General: Patient is sitting up in chair.  Cardiovascular: Regular rate rhythm   Respiratory: Crackles in the bases  Abdomen: Soft nontender positive bowel sounds  Extremities: 2+ edema  Data Reviewed: Basic Metabolic Panel:  Lab 09/07/11 9562 09/06/11 1258 09/06/11 0739  NA 144 143 143  K 4.1 4.2 --  CL 108 106 106  CO2 29 30 26   GLUCOSE 80 105* 83  BUN 25* 24* 25*  CREATININE 2.27* 2.29* 2.19*  CALCIUM 8.7 8.8 9.0  MG -- -- --  PHOS -- -- --   Liver Function Tests:  Lab 09/06/11 1258  AST 14  ALT 10  ALKPHOS 65  BILITOT 0.4  PROT 6.0  ALBUMIN 2.5*   CBC:  Lab 09/07/11 0535 09/06/11 1258 09/06/11 0739  WBC 6.9 6.9  6.6  NEUTROABS -- -- 4.8  HGB 11.5* 12.2 12.1  HCT 41.1 42.1 40.9  MCV 86.7 85.4 84.0  PLT 182 183 193   Cardiac Enzymes:  Lab 09/06/11 0739  CKTOTAL --  CKMB --  CKMBINDEX --  TROPONINI <0.30     Lab 09/07/11 1153 09/07/11 0625 09/06/11 1551 09/06/11 1143  GLUCAP 89 83 103* 76    Recent Results (from the past 240 hour(s))  URINE CULTURE     Status: Normal   Collection Time   09/06/11  8:46 AM      Component Value Range Status Comment   Specimen Description URINE, CATHETERIZED   Final    Special Requests NONE   Final    Culture  Setup Time 09/06/2011 09:16   Final    Colony Count NO GROWTH   Final    Culture NO GROWTH   Final    Report Status 09/07/2011 FINAL   Final      Studies: Dg Chest Portable 1 View  09/06/2011  *RADIOLOGY REPORT*  Clinical Data: Shortness of breath and weakness.  PORTABLE CHEST - 1 VIEW  Comparison: CT chest and chest radiograph 08/07/2008.  Findings: Trachea is midline.  Heart is enlarged.  Thoracic aorta is calcified.  There is bibasilar air space disease with probable bilateral pleural effusions.  Mild interstitial prominence in the upper lung zones.  IMPRESSION: Probable mild congestive heart failure.  Follow-up to clearing is recommended.  Original Report Authenticated By: Reyes Ivan, M.D.    Scheduled Meds:   . amLODipine  5 mg Oral Daily  . aspirin EC  81 mg Oral Daily  . atorvastatin  10 mg Oral q1800  . carvedilol  3.125 mg Oral BID WC  . enoxaparin (LOVENOX) injection  30 mg Subcutaneous Q24H  . fenofibrate  54 mg Oral Daily  . furosemide  40 mg Oral Q12H  . insulin aspart  0-15 Units Subcutaneous TID WC  . lisinopril  20 mg Oral Daily  . nicotine  21 mg Transdermal Daily  . paricalcitol  1 mcg Oral Daily  . sodium chloride  3 mL Intravenous Q12H  . sodium chloride  3 mL Intravenous Q12H  . DISCONTD: albuterol  2.5 mg Nebulization Q6H  . DISCONTD: amLODipine  10 mg Oral Daily  . DISCONTD: enoxaparin (LOVENOX) injection   40 mg Subcutaneous Q24H  . DISCONTD: ipratropium  0.5 mg Nebulization Q6H  . DISCONTD: lisinopril  10 mg Oral Daily   Continuous Infusions:

## 2011-09-07 NOTE — Clinical Social Work Psychosocial (Signed)
     Clinical Social Work Department BRIEF PSYCHOSOCIAL ASSESSMENT 09/07/2011  Patient:  [name], [name]     Account Number:  [id number]     Admit date:  09/06/2011  Clinical Social Worker:  Doree Albee  Date/Time:  09/07/2011 04:01 PM  Referred by:  RN  Date Referred:  09/07/2011 Referred for  SNF Placement   Other Referral:   Interview type:  Patient Other interview type:    PSYCHOSOCIAL DATA Living Status:  ALONE Admitted from facility:   Level of care:   Primary support name:  Sunday Corn Primary support relationship to patient:  CHILD, ADULT Degree of support available:   strong    CURRENT CONCERNS Current Concerns  Post-Acute Placement   Other Concerns:    SOCIAL WORK ASSESSMENT / PLAN CSW met with pt and pt daughter in pt room to discuss pt discharge plans. Pt was sleeping and unarousable and not engaging in conversation.    CSW spoke with pt dtr regarding pt home environment. Pt daughter shared that patient lives alone, and over the past few days has started to decline physically. Pt daughter feels patient is unsafe to return home and there is not enough assistance from family at this time.    Pt daughter and csw discussed recommendations from physical thrapy and MD recommending Skilled nurisng for short term rehab to help detemrine pt long term needs.    Pt daughter accpeted the recommendations however wanted to discuss it iwth pt son and pt. CSW and Pt dtr agreed to iniatte snf serach and will follow up tomorrow to discuss bed offers and further discharge plans.   Assessment/plan status:  Psychosocial Support/Ongoing Assessment of Needs Other assessment/ plan:   and discharge planning   Information/referral to community resources:   Skilled Nursing facility list    PATIENTS/FAMILYS RESPONSE TO PLAN OF CARE: Pt dtr thanked csw for concern and support. pt reamined asleep in chair unable to engage in the conversation. CSW will follow up with  pt.

## 2011-09-07 NOTE — Progress Notes (Signed)
I agree with above note 

## 2011-09-07 NOTE — Progress Notes (Signed)
Utilization Review Completed.Briella Hobday T7/16/2013   

## 2011-09-07 NOTE — Clinical Documentation Improvement (Signed)
RESPIRATORY FAILURE DOCUMENTATION CLARIFICATION QUERY   THIS DOCUMENT IS NOT A PERMANENT PART OF THE MEDICAL RECORD   Please update your documentation within the medical record to reflect your response to this query.                                                                                     09/07/11  Dr. Earlene Plater and/or Associates,  In a better effort to capture your patient's severity of illness, reflect appropriate length of stay and utilization of resources, a review of the patient medical record has revealed the following indicators:  "PER EMS- Patient called EMS this morning after waking up SOB. Patient has known history of COPD, wear continuous 02 at home. Fire Department states O2 was at 70% when they arrived. Placed on Non re-breather. Pt currently 100% on 15L. History HTN, asthma, diabetic, and cardiac disease. Alertx4, NAD at this moment. Allergic to Sulfa. CBG- 98" ED Nurse's Admission Note 09/06/11  "Patient is a 71 y.o. female presenting with shortness of breath. She is on home oxygen at 2 L.  EMS reported initial oxygen saturation of 70% which came up to 100% when placed on a nonrebreather. Vital signs are significant for tachypnea with respiratory rate of 24, Initial hypoxia has improved since she is maintaining adequate oxygen saturations with nasal cannula. Workup has been initiated including chest x-ray and laboratory workup." Dione Booze, MD  09/06/11 [phone]         ED Assessment  "She uses home oxygen and has been smoking with the oxygen COPD (chronic obstructive pulmonary disease) on home 02  Nebulizer treatments scheduled and when necessary"  Molli Posey, MD  [facility]  09/06/2011, 1:53 PM   Based on your clinical judgment, please document in the progress notes and discharge summary if a condition below provides greater specificity regarding the patient's renal status:   - Acute on Chronic Respiratory Failure   - Chronic Respiratory Failure   - Other Condition   - Unable to Clinically Determine   In responding to this query please exercise your independent judgment.  The fact that a query is asked, does not imply that any particular answer is desired or expected.   Reviewed:  no additional documentation provided.  Symptoms were secondary to Congestive heart failure  Thank You,  Jerral Ralph  RN BSN CCDS Certified Clinical Documentation Specialist: Cell   [phone]  Health Information Management [facility]  TO RESPOND TO THE THIS QUERY, FOLLOW THE INSTRUCTIONS BELOW:  1. If needed, update documentation for the patient's encounter via the notes activity.  2. Access this query again and click edit on the In [employer].  3. After updating, or not, click F2 to complete all highlighted (required) fields concerning your review. Select "additional documentation in the medical record" OR "no additional documentation provided".  4. Click Sign note button.  5. The deficiency will fall out of your In Basket *Please let us know if you are not able to complete this workflow by phone or e-mail (listed below).

## 2011-09-07 NOTE — Progress Notes (Signed)
Patient arrived to unit 2100 and was placed on Bipap per MD order. Patient is tolerating it well. Patient is stable and is in no acute respiratory distress.

## 2011-09-07 NOTE — Consult Note (Signed)
Name: [name] MRN: [id number] DOB: 22-Dec-1940    LOS: 1  [facility] Note   Referring Physician: Dr. Molli Posey  Primary Physician: Dr. Velna Hatchet  Reason for Consultation: Hypercarbic respiratory failure    History of Present Illness: 71 y/o F with PMH of DM, HTN, CHF, Cardiomyopathy, CKD (baseline cr 1.16 6/10), OSA, Cor Pulmonale, COPD on home O2 admitted on 7/15 by [facility] with 2 week history of worsening shortness of breath, dry non-productive cough and increasing lower extremity swelling.  Work up demonstrated elevated BNP (49k), CXR with cardiomegaly & mild edema vs. Atx, & worsened renal function.  7/16 patient noted to be lethargic and ABG obtained and demonstrated hypercarbic respiratory failure.  PCCM consulted for assistance with pulmonary mgmt.    Patient indicates she is supposed to wear CPAP at home but has not been compliant with wearing and has been sneaking cigarettes as of late.   Lines / Drains: 7/15 Foley>>>  Cultures: 7/15 UC>>>neg  Antibiotics:   Tests / Events: 7/15 CXR>>>Probable mild congestive heart failure. Follow-up to clearing is recommended    Past Medical History  Diagnosis Date  . COPD (chronic obstructive pulmonary disease)     on home o2  . Chronic renal insufficiency   . Diabetes mellitus     type 2  . CVA (cerebral infarction)     hx  . Hypertensive heart disease with congestive heart failure   . Chronic cor pulmonale   . Cardiomyopathy of undetermined type 09/07/2011  . Chronic kidney disease (CKD), stage IV (severe)   . Hyperlipdemia   . Obesity (BMI 30-39.9)   . Sleep apnea   . History of gout 09/07/2011    Past Surgical History  Procedure Date  . Bilateral oophorectomy     Prior to Admission medications   Medication Sig Start Date End Date Taking? Authorizing Provider  amLODipine (NORVASC) 10 MG tablet Take 10 mg by mouth daily.   Yes Historical Provider, MD  fenofibrate (TRICOR) 145  MG tablet Take 145 mg by mouth daily.   Yes Historical Provider, MD  meclizine (ANTIVERT) 25 MG tablet Take 25 mg by mouth 3 (three) times daily as needed. For dizziness   Yes Historical Provider, MD  OVER THE COUNTER MEDICATION Take 1 tablet by mouth daily. Basic Vitamin   Yes Historical Provider, MD  paricalcitol (ZEMPLAR) 1 MCG capsule Take 1 mcg by mouth daily.   Yes Historical Provider, MD    Allergies Allergies  Allergen Reactions  . Sulfonamide Derivatives Hives and Rash    Family History No family history on file.  Social History  reports that she has been smoking.  She does not have any smokeless tobacco history on file. She reports that she does not drink alcohol or use illicit drugs.  Review Of Systems:  Gen: Denies fever, chills, weight change, fatigue, night sweats HEENT: Denies blurred vision, double vision, hearing loss, tinnitus, sinus congestion, rhinorrhea, sore throat, neck stiffness, dysphagia PULM: Denies shortness of breath, cough, sputum production, hemoptysis, wheezing CV: Denies chest pain, paroxysmal nocturnal dyspnea, palpitations.  Indicates edema, orthopnea. GI: Denies abdominal pain, nausea, vomiting, diarrhea, hematochezia, melena, constipation, change in bowel habits GU: Denies dysuria, hematuria, polyuria, oliguria, urethral discharge Endocrine: Denies hot or cold intolerance, polyuria, polyphagia or appetite change Derm: Denies rash, dry skin, scaling or peeling skin change Heme: Denies easy bruising, bleeding, bleeding gums Neuro: Denies headache, numbness, weakness, slurred speech, loss of memory or consciousness  Vital Signs:  Temp:  [97.6 F (36.4 C)-98.9 F (37.2 C)] 98.3 F (36.8 C) (07/16 1456) Pulse Rate:  [76-103] 76  (07/16 1456) Resp:  [20-22] 20  (07/16 1456) BP: (140-162)/(62-93) 140/62 mmHg (07/16 1456) SpO2:  [95 %-100 %] 100 % (07/16 1456) Weight:  [220 lb 12.8 oz (100.154 kg)] 220 lb 12.8 oz (100.154 kg) (07/16 0500) I/O last 3  completed shifts: In: 480 [P.O.:480] Out: 1350 [Urine:1350]  Physical Examination: General: obese female in NAD, eating dinner Neuro: lethargic follows simple commands, drowsy -not following conversation CV: s1s2 rrr, distant PULM: resp's even/non-labored, lungs bilaterally with crackles GI: obese /soft, bsx4 active Extremities: warm/dry, 3+ pitting edema   Labs    CBC  Lab 09/07/11 0535 09/06/11 1258 09/06/11 0739  HGB 11.5* 12.2 12.1  HCT 41.1 42.1 40.9  WBC 6.9 6.9 6.6  PLT 182 183 193   BMET  Lab 09/07/11 0535 09/06/11 1258 09/06/11 0739  NA 144 143 143  K 4.1 4.2 --  CL 108 106 106  CO2 29 30 26   GLUCOSE 80 105* 83  BUN 25* 24* 25*  CREATININE 2.27* 2.29* 2.19*  CALCIUM 8.7 8.8 9.0  MG -- -- --  PHOS -- -- --    No results found for this basename: INR:5 in the last 168 hours   Lab 09/07/11 1620  PHART 7.237*  PCO2ART 77.1*  PO2ART 57.6*  HCO3 31.7*  TCO2 34.0  O2SAT 91.1   BNP (last 3 results)  Basename 09/06/11 0740  PROBNP 49670.0*     Radiology: See above.   7/16 CXR>>>  Assessment and Plan: Principal Problem:  *Acute respiratory failure with hypercapnia Active Problems:  Type 2 diabetes mellitus with vascular disease  Hyperlipdemia  Hypertensive heart disease with congestive heart failure  Chronic cor pulmonale  C O P D  Chronic kidney disease (CKD), stage IV (severe)  Acute systolic congestive heart failure  Cardiomyopathy of undetermined type   Acute hypercarbic respiratory failure  COPD  Cor Pulmonale  OSA  Assessment: multifactorial hypercarbic failure in setting of known O2 dep COPD, decompensated CHF, Hx of Cor Pulmonale and non-compliance with home CPAP regimen and morbid obesity.  No sedating medications received this admit.   PLAN: -transfer to ICU -now BiPAP -f/u abg 1 hour after bipap initiated -f/u cxr in am -d/c dilaudid -continue lasix Q12 -NPO x meds -discussed with family and they would be ok with  intubation if needed    HTN Hx of Cardiomyopathy Hx of CAD Hx of Hyperlipidemia  Assessment: Followed by Dr. Donnie Aho.    PLAN: -continue norvasc, asa, lipitor, coreg -hold ACE -Recommendations per Cardiology   Acute on Chronic Renal Insufficiency Assessment:  PLAN: -Hold ACE -monitor renal fxn with lasix    DM -SSI   Best practices / Disposition: -->Code Status: Full -->DVT Px: Heparin -->GI Px:not indicated -->Diet: NPO x meds / ice    Canary Brim, NP-C [facility] Pgr: [phone]  09/07/2011, 5:05 PM  Reviewed above, examined pt, and agree with assessment/plan.  She has acute on chronic hypoxic, hypercapnic respiratory failure in setting of OSA, probable OHS, continued tobacco abuse, combined acute on chronic heart failure, and cor pulmonale with 2nd pulmonary HTN.  She also has acute metabolic encephalopathy related to hypercapnia.  Will transfer to ICU and start NIPPV.  Discussed with pts daughter at bedside.  If respiratory failure progresses, then family would be in favor of intubation and mechanical ventilation.  Will put pt on PCCM service  while in ICU.  D/W Dr. Earlene Plater.  Critical care time 40 minutes.  Coralyn Helling, MD [facility] 09/07/2011, 5:36 PM Pager:  [phone] After 3pm call: [phone]

## 2011-09-07 NOTE — Progress Notes (Signed)
IP rehab RN called to place coude catheter on pt. Prepped sterile field and inserted 18fr coude catheter without any difficulty or resistance. Pt tolerated procedure well. 300cc clear yellow urine now present in foley bag. Continue to monitor. Mick Sell, RN

## 2011-09-07 NOTE — Consult Note (Addendum)
Cardiology Consult Note  Admit date: 09/06/2011 Name: [name] 71 y.o.  female DOB:  May 09, 1940 MRN:  [id number]  Today's date:  09/07/2011  Referring Physician:  Dr. Molli Posey  Primary Physician:   Dr. Velna Hatchet Reason for Consultation:   Cardiomyopathy  IMPRESSIONS: 1. Acute hypercarbic respiratory failure 2. Cardiomyopathy of undetermined type-onset since 2010 possible hypertensive versus silent ischemia 3. Obesity hypoventilation syndrome 4. Cor pulmonale with severe pulmonary hypertension 5. Acute systolic congestive heart failure 6. Type 2 diabetes mellitus vascular disease 7. Hyperlipidemia poorly controlled 8. History of asthma 9. History of gout 10. History of sleep apnea 11. Hypertensive heart disease with congestive heart failure 12. Bifascicular block 13. Morbid obesity  The etiology of the cardiomyopathy is unknown at this time. She has marked thickness of her left ventricular walls and her ejection fraction is much lower than it was in 2010. She is a candidate for silent myocardial ischemia but is a poor candidate for workup due to her comorbid risk factors.  RECOMMENDATION:  I was unable to get any meaningful history from the patient because she was so somnolent. The majority of the history is obtained from the daughter. I asked Dr. Earlene Plater to get a blood gas that returned showing significant respiratory acidosis and hypoxemia.  At this point in time I would recommend 1. Move to the intensive care unit or stepdown 2. Urgent pulmonary consultation 3. Discontinue ACE inhibitor because of renal insufficiency 4.consider renal consultation because of renal insufficiency 5. When stable from a pulmonary viewpoint consider beta blockers for cardiomyopathy 6. At this point in time with her comorbidities she is not a candidate for aggressive risk stratification her cardiac workup. She will need to be managed intensively medically.  HISTORY: This  71 year old female has a history of long-standing hypertension, diabetes mellitus with poor control and a history of COPD. She has been on home oxygen for some time. She was admitted to the hospital last in 2010 with nausea but a good quality discharge summary is not available from then.earlier that year she was admitted and was found to have hypercarbic respiratory failure. She had evidence of cor pulmonale at that time with RV systolic pressure 55-60. She has not had much medical followup recently and was supposed to see her primary doctor but was unable to keep the appointment. She was brought in by ambulance with acute respiratory failure was found to be hypoxemic. An echocardiogram was obtained that showed ejection fraction of 20-25%, significant LVH and severe pulmonary hypertension. I was asked to see her. When I came by to see her she was so somnolent in the chair that I can get no medical history from her at all and the medical history is obtained entirely from her daughter who is on dialysis. The daughter mentions that the mother is not been compliant with her medications. She is sedentary and seldom leaves the house. She denies angina and may have seen a cardiologist at some point in the past. She has chronic peripheral edema.   Past Medical History  Diagnosis Date  . COPD (chronic obstructive pulmonary disease)     on home o2  . Chronic renal insufficiency   . Diabetes mellitus     type 2  . Hypertensive heart disease with congestive heart failure   . Chronic cor pulmonale   . Cardiomyopathy of undetermined type 09/07/2011  . Chronic kidney disease (CKD), stage IV (severe)   . Hyperlipdemia   . Obesity (BMI 30-39.9)   .  Sleep apnea   . History of gout 09/07/2011     Past surgical history: Bilateral oopherectomy  Allergies:   is allergic to sulfonamide derivatives.   Medications: Prior to Admission medications   Medication Sig Start Date End Date Taking? Authorizing Provider    amLODipine (NORVASC) 10 MG tablet Take 10 mg by mouth daily.   Yes Historical Provider, MD  fenofibrate (TRICOR) 145 MG tablet Take 145 mg by mouth daily.   Yes Historical Provider, MD  meclizine (ANTIVERT) 25 MG tablet Take 25 mg by mouth 3 (three) times daily as needed. For dizziness   Yes Historical Provider, MD  OVER THE COUNTER MEDICATION Take 1 tablet by mouth daily. Basic Vitamin   Yes Historical Provider, MD  paricalcitol (ZEMPLAR) 1 MCG capsule Take 1 mcg by mouth daily.   Yes Historical Provider, MD    Family History: Family Status  Relation Status Death Age  . Father Deceased 53's    died of MI  . Mother Deceased 40    died of CVA  . Daughter Alive     CAD    Social History:   reports that she has been smoking.  She does not have any smokeless tobacco history on file. She reports that she does not drink alcohol or use illicit drugs.   History   Social History Narrative  . No narrative on file    Review of Systems: Unobtainable due to somnolence  Physical Exam: Blood pressure 140/62, pulse 76, temperature 98.3 F (36.8 C), temperature source Oral, resp. rate 20, height 5\' 4"  (1.626 m), weight 100.154 kg (220 lb 12.8 oz), SpO2 100.00%.    General appearance: large morbidly obese black female who is somnolent and barely arousable in the chair, she will not answer questions directly when asked. Eyes: EOMI, PERRLA, fundi not examined Throat: lips are prominent, some evidence of large tongue Lungs: reduced breath sounds at the bases Heart: regular rhythm, normal S1-S2, no S3, 2/6 systolic murmur at left sternal border Abdomen: severely obese and no gross masses noted Pelvic: deferred Pulses: difficult to feel Skin: no gross skin lesions Neurologic: Grossly normal   Labs: CBC  Basename 09/07/11 0535 09/06/11 0739  WBC 6.9 --  RBC 4.74 --  HGB 11.5* --  HCT 41.1 --  PLT 182 --  MCV 86.7 --  MCH 24.3* --  MCHC 28.0* --  RDW 18.2* --  LYMPHSABS -- 1.3   MONOABS -- 0.5  EOSABS -- 0.1  BASOSABS -- 0.0   CMP   Basename 09/07/11 0535 09/06/11 1258  NA 144 --  K 4.1 --  CL 108 --  CO2 29 --  GLUCOSE 80 --  BUN 25* --  CREATININE 2.27* --  CALCIUM 8.7 --  PROT -- 6.0  ALBUMIN -- 2.5*  AST -- 14  ALT -- 10  ALKPHOS -- 65  BILITOT -- 0.4  GFRNONAA 21* --  GFRAA 24* --   BNP (last 3 results)  Basename 09/06/11 0740  PROBNP 49670.0*   Cardiac Panel (last 3 results)  Basename 09/06/11 0739  CKTOTAL --  CKMB --  TROPONINI <0.30  RELINDX --    Radiology: Cardiomegaly, basilar atelectasis and possible pleural effusions, interstitial congestion  EKG: Sinus rhythm, right bundle-branch block and left axis deviation, first degree AV block Signed:  W. Ashley Royalty MD [facility]   Cardiology Consultant  09/07/2011, 4:40 PM

## 2011-09-07 NOTE — Progress Notes (Signed)
Physical Therapy Treatment Patient Details Name: [name] MRN: [id number] DOB: August 14, 1940 Today's Date: 09/07/2011 Time: 0927-1000 PT Time Calculation (min): 33 min  PT Assessment / Plan / Recommendation Comments on Treatment Session  Pt does not appear to be motivated at all to participate as evident in requiring max encouragement/cues to increase active participation.  Pt incontinent of bladder while sitting EOB & required max cueing from this clinician & NT to allow for pad change & pericare.       Follow Up Recommendations  Skilled nursing facility    Barriers to Discharge        Equipment Recommendations  Defer to next venue    Recommendations for Other Services    Frequency Min 3X/week   Plan Discharge plan remains appropriate    Precautions / Restrictions Precautions Precautions: Fall Restrictions Weight Bearing Restrictions: No       Mobility  Bed Mobility Bed Mobility: Supine to Sit;Sitting - Scoot to Edge of Bed Supine to Sit: 2: Max assist;HOB elevated;With rails Sitting - Scoot to Edge of Bed: 3: Mod assist Details for Bed Mobility Assistance: Max encouragement to increase active participation.  Cont's to be very uncooperative & self-limiting.  Pt requires increased time for all mobility.   Transfers Transfers: Sit to Stand;Stand to Sit;Squat Pivot Transfers Sit to Stand: With upper extremity assist;From bed;4: Min assist Stand to Sit: 4: Min assist;To chair/3-in-1 Squat Pivot Transfers: 4: Min assist Details for Transfer Assistance: Attempted to stand 2x's but pt 0% effort.  however, after ~15 minutes of sitting EOB with max encouragement to stand pt stood with min assist.  During bed>recliner pt flexed at hips & keeping hands on front bar & resting forearms on hand grips of RW with ~90% hip flexion despite max cues for safe technique.      Exercises      PT Goals Acute Rehab PT Goals Time For Goal Achievement: 09/13/11 PT Goal: Supine/Side to  Sit - Progress: Not met PT Goal: Sit to Stand - Progress: Not met PT Goal: Stand to Sit - Progress: Not met  Visit Information  Last PT Received On: 09/07/11 Assistance Needed: +2 (+1 if pt actively participating)    Subjective Data      Cognition  Overall Cognitive Status: Difficult to assess Arousal/Alertness: Awake/alert Orientation Level: Appears intact for tasks assessed Cognition - Other Comments: Difficult to assess secondary to patient relatively non-communicative. Does not respond to questions clearly ignoring PT. Poor motivation to help self or move unless she wants to.    Balance     End of Session PT - End of Session Equipment Utilized During Treatment: Gait belt Activity Tolerance: Other (comment) (self limits) Patient left: in chair;with call bell/phone within reach Nurse Communication: Mobility status     Verdell Face, [place] [phone] 09/07/2011

## 2011-09-08 ENCOUNTER — Inpatient Hospital Stay (HOSPITAL_COMMUNITY): Payer: Medicare Other

## 2011-09-08 LAB — GLUCOSE, CAPILLARY
Glucose-Capillary: 105 mg/dL — ABNORMAL HIGH (ref 70–99)
Glucose-Capillary: 69 mg/dL — ABNORMAL LOW (ref 70–99)
Glucose-Capillary: 79 mg/dL (ref 70–99)
Glucose-Capillary: 79 mg/dL (ref 70–99)

## 2011-09-08 LAB — CBC
Hemoglobin: 11.3 g/dL — ABNORMAL LOW (ref 12.0–15.0)
MCH: 24.7 pg — ABNORMAL LOW (ref 26.0–34.0)
MCHC: 28.8 g/dL — ABNORMAL LOW (ref 30.0–36.0)
RDW: 18.4 % — ABNORMAL HIGH (ref 11.5–15.5)

## 2011-09-08 LAB — BASIC METABOLIC PANEL
BUN: 24 mg/dL — ABNORMAL HIGH (ref 6–23)
Calcium: 8.5 mg/dL (ref 8.4–10.5)
Creatinine, Ser: 2.28 mg/dL — ABNORMAL HIGH (ref 0.50–1.10)
GFR calc Af Amer: 24 mL/min — ABNORMAL LOW (ref >90)
GFR calc non Af Amer: 20 mL/min — ABNORMAL LOW (ref >90)
Glucose, Bld: 75 mg/dL (ref 70–99)
Potassium: 4.1 mEq/L (ref 3.5–5.1)

## 2011-09-08 LAB — PRO B NATRIURETIC PEPTIDE: Pro B Natriuretic peptide (BNP): 34331 pg/mL — ABNORMAL HIGH (ref 0–125)

## 2011-09-08 MED ORDER — IPRATROPIUM BROMIDE 0.02 % IN SOLN
0.5000 mg | RESPIRATORY_TRACT | Status: DC
  Administered 2011-09-08 – 2011-09-09 (×8): 0.5 mg via RESPIRATORY_TRACT
  Filled 2011-09-08 (×9): qty 2.5

## 2011-09-08 MED ORDER — MOXIFLOXACIN HCL 400 MG PO TABS
400.0000 mg | ORAL_TABLET | Freq: Every day | ORAL | Status: DC
  Administered 2011-09-08 – 2011-09-16 (×9): 400 mg via ORAL
  Filled 2011-09-08 (×9): qty 1

## 2011-09-08 MED ORDER — DEXTROSE 50 % IV SOLN
INTRAVENOUS | Status: AC
  Administered 2011-09-08: 50 mL
  Filled 2011-09-08: qty 50

## 2011-09-08 MED ORDER — ALBUTEROL SULFATE (5 MG/ML) 0.5% IN NEBU
2.5000 mg | INHALATION_SOLUTION | RESPIRATORY_TRACT | Status: DC
  Administered 2011-09-08 – 2011-09-09 (×8): 2.5 mg via RESPIRATORY_TRACT
  Filled 2011-09-08 (×9): qty 0.5

## 2011-09-08 MED ORDER — FUROSEMIDE 10 MG/ML IJ SOLN
40.0000 mg | Freq: Two times a day (BID) | INTRAMUSCULAR | Status: DC
  Administered 2011-09-08 – 2011-09-10 (×5): 40 mg via INTRAVENOUS
  Filled 2011-09-08 (×7): qty 4

## 2011-09-08 MED ORDER — ALBUTEROL SULFATE (5 MG/ML) 0.5% IN NEBU: 2.5000 mg | INHALATION_SOLUTION | RESPIRATORY_TRACT | Status: DC | PRN

## 2011-09-08 MED ORDER — PREDNISONE 50 MG PO TABS
50.0000 mg | ORAL_TABLET | Freq: Every day | ORAL | Status: DC
  Administered 2011-09-08 – 2011-09-11 (×4): 50 mg via ORAL
  Filled 2011-09-08 (×6): qty 1

## 2011-09-08 MED ORDER — IPRATROPIUM BROMIDE 0.02 % IN SOLN: 0.5000 mg | RESPIRATORY_TRACT | Status: DC | PRN

## 2011-09-08 MED ORDER — HYDRALAZINE HCL 10 MG PO TABS
10.0000 mg | ORAL_TABLET | Freq: Three times a day (TID) | ORAL | Status: DC
  Administered 2011-09-08 – 2011-09-09 (×3): 10 mg via ORAL
  Filled 2011-09-08 (×6): qty 1

## 2011-09-08 MED ORDER — FUROSEMIDE 10 MG/ML IJ SOLN
INTRAMUSCULAR | Status: AC
  Filled 2011-09-08: qty 4

## 2011-09-08 NOTE — Progress Notes (Signed)
Subjective:  More alert today but still very slow and hesitant to respond to questions.  Not SOB.  Objective:  Vital Signs in the last 24 hours: BP 132/55  Pulse 63  Temp 97.8 F (36.6 C) (Oral)  Resp 20  Ht 5\' 4"  (1.626 m)  Wt 100.154 kg (220 lb 12.8 oz)  BMI 37.90 kg/m2  SpO2 98%  Physical Exam: Very large BF lying in bed lethargic but will answer questions slowly Lungs:  Clear with reduced breath sounds Cardiac:  Regular rhythm, normal S1 and S2, no S3, 1-2/6 systolic murmur Abdomen:  Very large soft and tnontender Extremities:  3+ edema present  Intake/Output from previous day: 07/16 0701 - 07/17 0700 In: 510 [P.O.:420; I.V.:90] Out: 640 [Urine:640] Weight Filed Weights   09/06/11 1150 09/07/11 0500  Weight: 102.5 kg (225 lb 15.5 oz) 100.154 kg (220 lb 12.8 oz)    Lab Results: Basic Metabolic Panel:  Basename 09/08/11 0350 09/07/11 1932  NA 146* 143  K 4.1 4.1  CL 107 107  CO2 33* 31  GLUCOSE 75 90  BUN 24* 24*  CREATININE 2.28* 2.29*    CBC:  Basename 09/08/11 0350 09/07/11 1932 09/06/11 0739  WBC 5.2 6.3 --  NEUTROABS -- -- 4.8  HGB 11.3* 11.3* --  HCT 39.3 39.3 --  MCV 85.8 86.4 --  PLT 150 163 --    BNP    Component Value Date/Time   PROBNP 34331.0* 09/08/2011 0350   Telemetry: Sinus rhythm with some PAC's, PVC's  Assessment/Plan:  1. Acute on chronic respiratory failure 2. Mixed picture cardiomyopathy 3. Chronic kidney disease stage 4 4. Hypertensive heart disease with CHF 5. Acute on chronic systolic CHF  Rec:  She has severe cor pulmonale  And chronic resp failure.  She needs to be diureses as long as BP and renal failure will allow.  She at the present time is a poor candidate for invasive cardiac evaluation and should be treated medically.  Consider adding hydralazine for afterload reduction and CHF. Continue diuresis and follow weights.  Pulmoanry to address C)2 retention and oxygen requirements.  Darden Palmer  MD  [facility]  09/08/2011, 9:09 AM

## 2011-09-08 NOTE — Consult Note (Signed)
Name: [name] MRN: [id number] DOB: July 24, 1940    LOS: 2  Referring Provider:  Dr Molli Posey Reason for Referral:  Hypercarbic respiratory Failure  PULMONARY / CRITICAL CARE MEDICINE  Brief patient description:  71 y/o F with PMH of DM, HTN, CHF, Cardiomyopathy, CKD (baseline cr 1.16 6/10), OSA, Cor Pulmonale, COPD on home O2 admitted on 7/15 by TRH with 2 week history of worsening shortness of breath, dry non-productive cough and increasing lower extremity swelling. Work up demonstrated elevated BNP (49k), CXR with cardiomegaly & mild edema vs. Atx, & worsened renal function. 7/16 patient noted to be lethargic and ABG obtained and demonstrated hypercarbic respiratory failure. PCCM consulted for assistance with pulmonary mgmt.   Events Since Admission: 7/15  Admitted to [facility] with dyspnea / leg edema 7/16  Amission to CCM due to hypercarbic respiratory failure  Current Status:  Off BiPAP this morning.  Vital Signs: Temp:  [97.8 F (36.6 C)-98.9 F (37.2 C)] 97.8 F (36.6 C) (07/17 0751) Pulse Rate:  [49-108] 63  (07/17 0700) Resp:  [11-27] 20  (07/17 0700) BP: (102-158)/(54-81) 132/55 mmHg (07/17 0700) SpO2:  [96 %-100 %] 98 % (07/17 0700) FiO2 (%):  [50 %] 50 % (07/16 1815)  Physical Examination: General:  Mild Distress Neuro:  CN grossly intact HEENT:  MMM Neck:  Normal ROM Cardiovascular:  Soft heart sounds. RRR Lungs:  DIminished breath sounds w/ wheezing bilat in ant and post lung fields. ON 4L [place] Abdomen:  NABS, soft non-tender Musculoskeletal:  3+ LE pitting edema, 2+ UE edema, 2+ pulses in UE Skin:  intact  Principal Problem:  *Acute respiratory failure with hypercapnia Active Problems:  Type 2 diabetes mellitus with vascular disease  Hyperlipdemia  Hypertensive heart disease with congestive heart failure  Chronic cor pulmonale  C O P D  Chronic kidney disease (CKD), stage IV (severe)  Acute systolic congestive heart failure  Cardiomyopathy of  undetermined type  ASSESSMENT AND PLAN  PULMONARY  Lab 09/07/11 2004 09/07/11 1620  PHART 7.307* 7.237*  PCO2ART 65.9* 77.1*  PO2ART 109.0* 57.6*  HCO3 32.9* 31.7*  O2SAT 97.0 91.1   Ventilator Settings: Vent Mode:  [-]  FiO2 (%):  [50 %] 50 % None.   CXR: 7/17 >>> Interval worsening right lung base airspace disease/pneumonia. Continued retrocardiac pulmonary collapse. Probable small effusions. ETT:  NA  A:  Active smoker.  Acute hypercarbic respiratory failure likely multifactorial due to COPD, Cor Pulmonale, OSA, OHS, CHF exacerbation.  Atelectasis vs PNA vs Pul Edema on CXR. Currently sating 90% on 2L [place]. Fluid status down 1L since admission P:   Supplemental O2 keep sats > 92% BiPAP at night and PRN CXR in am Change Lasix 40 mg IV q12h Duonebs scheduled and PRN DC robitussin DM Nicotine patch for smoking cessation Prednisone 50 mg PO daily  CARDIOVASCULAR  Lab 09/08/11 0350 09/07/11 1931 09/06/11 0740 09/06/11 0739  TROPONINI -- <0.30 -- <0.30  LATICACIDVEN -- -- -- --  PROBNP 34331.0* -- 49670.0* --   ECG:  7/16  Left axis deviation, Right bundle branch block (seen as far back as 2012), Left ventricular hypertrophy  Lines: NA  A: Mixed cardiomyopahty w/ severe cor pulmonale, HTN, and acute on chronic systolic CHF. Cards following. H/o CAD P:  IVF KVO Lasix as above Continue home Amlodipine and Coreg Continue home ASA 81mg  Hydralazine 10mg  PO Q8 for afterload reduction Cointinue Statin NO ACEI/ARB as renal impairment  RENAL  Lab 09/08/11 0350 09/07/11 1932 09/07/11 0535 09/06/11  1258 09/06/11 0739  NA 146* 143 144 143 143  K 4.1 4.1 -- -- --  CL 107 107 108 106 106  CO2 33* 31 29 30 26   BUN 24* 24* 25* 24* 25*  CREATININE 2.28* 2.29* 2.27* 2.29* 2.19*  CALCIUM 8.5 8.6 8.7 8.8 9.0  MG -- 1.3* -- -- --  PHOS -- -- -- -- --   Intake/Output      07/16 0701 - 07/17 0700 07/17 0701 - 07/18 0700   P.O. 420    I.V. (mL/kg) 90 (0.9)    Total  Intake(mL/kg) 510 (5.1)    Urine (mL/kg/hr) 640 (0.3)    Total Output 640    Net -130         Urine Occurrence 1 x     Foley:  7/16>>>  Renal US: Bilateral renal cysts as described above. Some of the smaller structures cannot be confirmed as simple cysts. No hydronephrosis. Calcification in the hilar region probably represents vascular calcifications.  A:  Acute on Chronic Renal Failure. Likely acutely worsened intrinsically by Lasix , lisinopril, HTN, and DM. P:   Cont hold Lisinopril BMET in am  GASTROINTESTINAL  Lab 09/07/11 1932 09/06/11 1258  AST 13 14  ALT 9 10  ALKPHOS 58 65  BILITOT 0.4 0.4  PROT 5.4* 6.0  ALBUMIN 2.1* 2.5*   A:  No acute issues P:   Diet  HEMATOLOGIC  Lab 09/08/11 0350 09/07/11 1932 09/07/11 0535 09/06/11 1258 09/06/11 0739  HGB 11.3* 11.3* 11.5* 12.2 12.1  HCT 39.3 39.3 41.1 42.1 40.9  PLT 150 163 182 183 193  INR -- -- -- -- --  APTT -- -- -- -- --   A:  Anemia. Likely of chronic disease P:  CBC in am  INFECTIOUS  Lab 09/08/11 0350 09/07/11 1932 09/07/11 0535 09/06/11 1258 09/06/11 0739  WBC 5.2 6.3 6.9 6.9 6.6  PROCALCITON -- -- -- -- --   Cultures: 7/15 Urine >>> Neg  Antibiotics: Moxifloxacin COPD exacerbation 7/17 >>>  A:  No signs of infection, but suspected COPD exacerbation. P:   ABx/Cx as above PCT  ENDOCRINE  Lab 09/08/11 0750 09/08/11 0354 09/08/11 0020 09/07/11 1953 09/07/11 1818  GLUCAP 79 69* 79 77 82   A:  DM Type II. A1c on admission 4.6. Hypoglycemic and NPO.  At risk for steroid-induced hyperglycemia.  Hyperparathyroidism.  P:   Continue SSI Continue Paricalcitol  NEUROLOGIC  A:  No acute issues P:   No interventions  BEST PRACTICE / DISPOSITION Level of Care:  ICU Primary Service:  PCCM Consultants:  Cardiology Code Status:  Full Diet:  Heart Healthy DVT Px:  Hep GI Px:  No indicated Skin Integrity:  intact Social / Family:    MERRELL, DAVID, M.D. Family Medicine PGY-2 09/08/2011,  9:01 AM  Patient examined.  Records reviewed.  Assessment and plan above is edited and discussed with ICU Resident Team.  Orlean Bradford, M.D., F.C.C.P. Pulmonary and Critical Care Medicine [facility] Cell: [phone] Pager: [phone]

## 2011-09-08 NOTE — Progress Notes (Signed)
CRITICAL VALUE ALERT  Critical value received:  cbg 69   Date of notification:  09/08/2011  Time of notification:  0355  Critical value read back:yes  Nurse who received alert:  Carlyon Prows RN   MD notified (1st page):  Dr Deterding   Time of first page:  4:06 AM   MD notified (2nd page):  Time of second page:  Responding MD:  Dr. Darrick Penna  Time MD responded:  4:06 AM

## 2011-09-08 NOTE — Progress Notes (Signed)
Pharmacist Heart Failure Core Measure Documentation  Assessment: [name] has an EF documented as 25-30% on 7/15 by ECHO.  Rationale: Heart failure patients with left ventricular systolic dysfunction (LVSD) and an EF < 40% should be prescribed an angiotensin converting enzyme inhibitor (ACEI) or angiotensin receptor blocker (ARB) at discharge unless a contraindication is documented in the medical record.  This patient is not currently on an ACEI or ARB for HF.  This note is being placed in the record in order to provide documentation that a contraindication to the use of these agents is present for this encounter.  ACE Inhibitor or Angiotensin Receptor Blocker is contraindicated (specify all that apply)  []   ACEI allergy AND ARB allergy []   Angioedema []   Moderate or severe aortic stenosis []   Hyperkalemia []   Hypotension []   Renal artery stenosis [x]   Worsening renal function, preexisting renal disease or dysfunction   Rolland Porter, Pharm.D., BCPS Clinical Pharmacist Pager: [phone] 09/08/2011 1:06 PM

## 2011-09-09 ENCOUNTER — Inpatient Hospital Stay (HOSPITAL_COMMUNITY): Payer: Medicare Other

## 2011-09-09 LAB — GLUCOSE, CAPILLARY
Glucose-Capillary: 129 mg/dL — ABNORMAL HIGH (ref 70–99)
Glucose-Capillary: 132 mg/dL — ABNORMAL HIGH (ref 70–99)
Glucose-Capillary: 151 mg/dL — ABNORMAL HIGH (ref 70–99)
Glucose-Capillary: 164 mg/dL — ABNORMAL HIGH (ref 70–99)
Glucose-Capillary: 166 mg/dL — ABNORMAL HIGH (ref 70–99)
Glucose-Capillary: 181 mg/dL — ABNORMAL HIGH (ref 70–99)

## 2011-09-09 LAB — BASIC METABOLIC PANEL
BUN: 30 mg/dL — ABNORMAL HIGH (ref 6–23)
Creatinine, Ser: 2.11 mg/dL — ABNORMAL HIGH (ref 0.50–1.10)
GFR calc Af Amer: 26 mL/min — ABNORMAL LOW (ref >90)
GFR calc non Af Amer: 22 mL/min — ABNORMAL LOW (ref >90)

## 2011-09-09 LAB — URINE MICROSCOPIC-ADD ON

## 2011-09-09 LAB — URINALYSIS, ROUTINE W REFLEX MICROSCOPIC
Protein, ur: 100 mg/dL — AB
Urobilinogen, UA: 0.2 mg/dL (ref 0.0–1.0)

## 2011-09-09 LAB — CBC
MCHC: 29.8 g/dL — ABNORMAL LOW (ref 30.0–36.0)
RDW: 17.8 % — ABNORMAL HIGH (ref 11.5–15.5)

## 2011-09-09 MED ORDER — HYDRALAZINE HCL 25 MG PO TABS
25.0000 mg | ORAL_TABLET | Freq: Three times a day (TID) | ORAL | Status: DC
  Administered 2011-09-09 – 2011-09-10 (×3): 25 mg via ORAL
  Filled 2011-09-09 (×9): qty 1

## 2011-09-09 MED ORDER — MAGNESIUM SULFATE 40 MG/ML IJ SOLN
2.0000 g | Freq: Once | INTRAMUSCULAR | Status: DC
  Filled 2011-09-09: qty 50

## 2011-09-09 MED ORDER — MAGNESIUM CHLORIDE 64 MG PO TBEC
2.0000 | DELAYED_RELEASE_TABLET | Freq: Every day | ORAL | Status: DC
  Administered 2011-09-09 – 2011-09-16 (×8): 128 mg via ORAL
  Filled 2011-09-09 (×10): qty 2

## 2011-09-09 MED ORDER — MAGNESIUM SULFATE 40 MG/ML IJ SOLN
2.0000 g | Freq: Once | INTRAMUSCULAR | Status: AC
  Administered 2011-09-09: 2 g via INTRAVENOUS
  Filled 2011-09-09: qty 50

## 2011-09-09 MED ORDER — CARVEDILOL 12.5 MG PO TABS
12.5000 mg | ORAL_TABLET | Freq: Two times a day (BID) | ORAL | Status: DC
  Administered 2011-09-10 – 2011-09-16 (×13): 12.5 mg via ORAL
  Filled 2011-09-09 (×16): qty 1

## 2011-09-09 MED ORDER — CARVEDILOL 6.25 MG PO TABS
6.2500 mg | ORAL_TABLET | Freq: Two times a day (BID) | ORAL | Status: DC
  Administered 2011-09-09: 6.25 mg via ORAL
  Filled 2011-09-09 (×2): qty 1

## 2011-09-09 MED ORDER — CARVEDILOL 6.25 MG PO TABS
6.2500 mg | ORAL_TABLET | Freq: Once | ORAL | Status: AC
  Administered 2011-09-09: 6.25 mg via ORAL
  Filled 2011-09-09: qty 1

## 2011-09-09 MED ORDER — MAGNESIUM CHLORIDE 64 MG PO TBEC
2.0000 | DELAYED_RELEASE_TABLET | Freq: Every day | ORAL | Status: DC
  Filled 2011-09-09: qty 2

## 2011-09-09 NOTE — Progress Notes (Signed)
CSW met with pt at bedside to discuss pt discharge plans. Pt is agreeing to snf placement at this time. Pt asked csw to speak further with pt daughter regarding choosing the facility. CSW left message with pt daughter.  CSW awaiting word from pasarr regarding pt [facility] # and ssn   .Clinical social worker continuing to follow pt to assist with pt dc plans and further csw needs.   Catha Gosselin, [place]  [phone] .09/09/2011 13:27pm

## 2011-09-09 NOTE — Progress Notes (Signed)
Pt remain asymptomatic with accelerated junctional rhythm.  Dr. Anne Fu informed of rhythm  Of  Rate of 110 and asymptomatic.  Notified MD, also mentioned  pt currently receiving  mag iv and po iv that was ordered.  Dr. Anne Fu  instructed to call if pt has further event.  On coming nurse made aware.  Amanda Pea, Charity fundraiser.

## 2011-09-09 NOTE — Progress Notes (Signed)
Subjective:  Much more alert today and converses normally. Not SOB. No chest pain.  Objective:  Vital Signs in the last 24 hours: BP 160/71  Pulse 70  Temp 98.2 F (36.8 C) (Oral)  Resp 27  Ht 5\' 4"  (1.626 m)  Wt 100.154 kg (220 lb 12.8 oz)  BMI 37.90 kg/m2  SpO2 97%  Physical Exam: Very large BF sitting up in bed Lungs:  Clear with reduced breath sounds Cardiac:  Regular rhythm, normal S1 and S2, no S3, 1-2/6 systolic murmur Abdomen:  Very large soft and tnontender Extremities:  3+ edema present  Intake/Output from previous day: 07/17 0701 - 07/18 0700 In: 894 [P.O.:860; I.V.:30; IV Piggyback:4] Out: 1460 [Urine:1460] Weight Filed Weights   09/06/11 1150 09/07/11 0500  Weight: 102.5 kg (225 lb 15.5 oz) 100.154 kg (220 lb 12.8 oz)    Lab Results: Basic Metabolic Panel:  Basename 09/08/11 0350 09/07/11 1932  NA 146* 143  K 4.1 4.1  CL 107 107  CO2 33* 31  GLUCOSE 75 90  BUN 24* 24*  CREATININE 2.28* 2.29*    CBC:  Basename 09/08/11 0350 09/07/11 1932  WBC 5.2 6.3  NEUTROABS -- --  HGB 11.3* 11.3*  HCT 39.3 39.3  MCV 85.8 86.4  PLT 150 163    BNP    Component Value Date/Time   PROBNP 34331.0* 09/08/2011 0350   Telemetry: Sinus rhythm with some PAC's, PVC's  Assessment/Plan:  1. Acute on chronic respiratory failure 2. Mixed picture cardiomyopathy 3. Chronic kidney disease stage 4 4. Hypertensive heart disease with CHF 5. Acute on chronic systolic CHF Still volume overloaded  Rec:   She needs to be diuresed as long as BP and renal failure will allow.  She at the present time is a poor candidate for invasive cardiac evaluation and should be treated medically.  Add hydralazine for BP and afterload reduction.  Watch renal failure as diureses occurs.  Darden Palmer  MD [facility]  09/09/2011, 8:57 AM

## 2011-09-09 NOTE — Progress Notes (Signed)
Pt's mag 1.2 with am lab.  No med noted or given at 2100 informed Dr. Estrella Myrtle and instructed that she will put in order to have mag replaced starting with iv and po tomorrow. Also called [facility], RN that cared for pt today stated her preceptor off the floor and the mag done this am but not replaced.  Amanda Pea, Charity fundraiser.

## 2011-09-09 NOTE — Progress Notes (Signed)
Hypomagnesemia;   Magnesium replaced.

## 2011-09-09 NOTE — Progress Notes (Signed)
Pt had Accelerate Junctional rhythm beats of 110, asymptomatic.  Dr Emmaline Kluver informed and instructed to call cardiologist, Dr Donnie Aho.  Dr Anne Fu on call at this time paged.  BP 139/82, P103.  Amanda Pea, Charity fundraiser.

## 2011-09-09 NOTE — Progress Notes (Signed)
Pt arrived to floor from 2100 via w/c. accompanied by nurse Revonda Standard, RN.  Pt A&0 x2.  Reoriented to place and room.  Up with assistance x2  to recliner,  Pt weak and require assit.  Will cont. To monitor.  Amanda Pea, Charity fundraiser.

## 2011-09-09 NOTE — Progress Notes (Signed)
Patient refusing Bipap at this time. Will continue to monitor.

## 2011-09-09 NOTE — Progress Notes (Signed)
Pt mag dose sent not as ordered by MD.  Pharmacy made aware to send ordered dose.  Oncoming nurse made aware and to give ordered when sent from pharmacy.  Amanda Pea, Charity fundraiser.

## 2011-09-09 NOTE — Progress Notes (Signed)
Nonsustained narrow complex tachycardia noted by nursing staff 19:50; resolved without intervention. Now in sinus rhythm. I advised the staff to call Dr. Donnie Aho; she is followed by cardiology.

## 2011-09-09 NOTE — Consult Note (Signed)
Name: [name] MRN: [id number] DOB: [birth year]    LOS: 3  Referring Provider:  Dr Molli Posey Reason for Referral:  Hypercarbic respiratory Failure  PULMONARY / CRITICAL CARE MEDICINE  Brief patient description:  71 y/o F with PMH of DM, HTN, CHF, Cardiomyopathy, CKD (baseline cr 1.16 6/10), OSA, Cor Pulmonale, COPD on home O2 admitted on 7/15 by TRH with 2 week history of worsening shortness of breath, dry non-productive cough and increasing lower extremity swelling. Work up demonstrated elevated BNP (49k), CXR with cardiomegaly & mild edema vs. Atx, & worsened renal function. 7/16 patient noted to be lethargic and ABG obtained and demonstrated hypercarbic respiratory failure. PCCM consulted for assistance with pulmonary mgmt.   Events Since Admission: 7/15  Admitted to [facility] with dyspnea / leg edema 7/16  Amission to CCM due to hypercarbic respiratory failure 7/18 Refusing to use BIPAP  Current Status:  On [place]. Did not wear BIPAP overnight due to it "Falling off"  Vital Signs: Temp:  [98.2 F (36.8 C)-98.8 F (37.1 C)] 98.2 F (36.8 C) (07/18 0805) Pulse Rate:  [62-102] 98  (07/18 0800) Resp:  [18-35] 25  (07/18 0800) BP: (138-169)/(61-101) 161/101 mmHg (07/18 0918) SpO2:  [90 %-100 %] 97 % (07/18 0813)  Physical Examination: General:  Mild Distress Neuro:  CN grossly intact HEENT:  MMM Neck:  Normal ROM Cardiovascular:  Soft heart sounds. RRR Lungs:  DIminished breath sounds w/ wheezing bilat in ant and post lung fields. ON 4L [place] Abdomen:  NABS, soft non-tender Musculoskeletal:  3+ LE pitting edema, 2+ UE edema, 2+ pulses in UE Skin:  intact  Principal Problem:  *Acute respiratory failure with hypercapnia Active Problems:  Type 2 diabetes mellitus with vascular disease  Hyperlipdemia  Hypertensive heart disease with congestive heart failure  Chronic cor pulmonale  C O P D  Chronic kidney disease (CKD), stage IV (severe)  Acute systolic congestive heart  failure  Cardiomyopathy of undetermined type  ASSESSMENT AND PLAN  PULMONARY  Lab 09/07/11 2004 09/07/11 1620  PHART 7.307* 7.237*  PCO2ART 65.9* 77.1*  PO2ART 109.0* 57.6*  HCO3 32.9* 31.7*  O2SAT 97.0 91.1   Ventilator Settings:   None.   CXR: 7/18 >>> Small lung volumes.  Bilateral airspace disease. L effusion. ETT:  NA  A:  Active smoker.  Acute hypercarbic respiratory failure likely multifactorial due to COPD, Cor Pulmonale, OSA, OHS, CHF exacerbation.  Atelectasis vs PNA vs Pul Edema on CXR.  Fluid status down 1.5L since admission.  P:   Supplemental O2 keep sats 88-92 BiPAP at night and PRN CXR in am Lasix 40 mg IV q12h Duonebs scheduled Q4 and Q2 PRN Nicotine patch for smoking cessation Prednisone 50 mg PO daily  CARDIOVASCULAR  Lab 09/08/11 0350 09/07/11 1931 09/06/11 0740 09/06/11 0739  TROPONINI -- <0.30 -- <0.30  LATICACIDVEN -- -- -- --  PROBNP 34331.0* -- 49670.0* --   ECG:  7/16  Left axis deviation, Right bundle branch block (seen as far back as 2012), Left ventricular hypertrophy  Lines: NA  A: Mixed cardiomyopahty w/ severe cor pulmonale, HTN, and acute on chronic systolic CHF. Cards following. H/o CAD P:  IVF KVO Lasix as above Continue home Amlodipine 5mg  Increase home Coreg to 6.25mg  as BP elevated Continue home ASA 81mg  Increase Hydralazine to 25mg  PO Q8 for afterload reduction Cointinue Statin NO ACEI/ARB as renal impairment  RENAL  Lab 09/09/11 0837 09/08/11 0350 09/07/11 1932 09/07/11 0535 09/06/11 1258  NA 139 146* 143 144  143  K 4.3 4.1 -- -- --  CL 102 107 107 108 106  CO2 31 33* 31 29 30   BUN 30* 24* 24* 25* 24*  CREATININE 2.11* 2.28* 2.29* 2.27* 2.29*  CALCIUM 8.4 8.5 8.6 8.7 8.8  MG -- -- 1.3* -- --  PHOS -- -- -- -- --   Intake/Output      07/17 0701 - 07/18 0700 07/18 0701 - 07/19 0700   P.O. 860 480   I.V. (mL/kg) 30 (0.3)    IV Piggyback 4    Total Intake(mL/kg) 894 (8.9) 480 (4.8)   Urine (mL/kg/hr) 1460 (0.6)  225   Total Output 1460 225   Net -566 +255         Foley:  7/16>>>7/18  Renal US: Bilateral renal cysts as described above. Some of the smaller structures cannot be confirmed as simple cysts. No hydronephrosis. Calcification in the hilar region probably represents vascular calcifications.  A:  Acute on Chronic Renal Failure. Likely acutely worsened intrinsically by Lasix , lisinopril, HTN, and DM. Cr improving. UOP as above.  P:   Cont hold Lisinopril DC Foley BMET in am  GASTROINTESTINAL  Lab 09/07/11 1932 09/06/11 1258  AST 13 14  ALT 9 10  ALKPHOS 58 65  BILITOT 0.4 0.4  PROT 5.4* 6.0  ALBUMIN 2.1* 2.5*   A:  No acute issues P:   Diet  HEMATOLOGIC  Lab 09/09/11 0837 09/08/11 0350 09/07/11 1932 09/07/11 0535 09/06/11 1258  HGB 11.6* 11.3* 11.3* 11.5* 12.2  HCT 38.9 39.3 39.3 41.1 42.1  PLT 166 150 163 182 183  INR -- -- -- -- --  APTT -- -- -- -- --   A:  Anemia. Likely of chronic disease P:  CBC in am  INFECTIOUS  Lab 09/09/11 0837 09/08/11 1242 09/08/11 0350 09/07/11 1932 09/07/11 0535 09/06/11 1258  WBC 6.7 -- 5.2 6.3 6.9 6.9  PROCALCITON -- <0.10 -- -- -- --   Cultures: 7/15 Urine >>> Neg  Antibiotics: Moxifloxacin COPD exacerbation 7/17 >>>  A: Pneumonia unlikely. Suspected COPD exacerbation. P:   ABx/Cx as above  ENDOCRINE  Lab 09/09/11 0804 09/09/11 0613 09/09/11 0418 09/09/11 0017 09/08/11 1958  GLUCAP 132* 166* 151* 196* 117*   A:  DM Type II. A1c on admission 4.6. At risk for steroid-induced hyperglycemia.  Hyperparathyroidism.  P:   Continue SSI Continue Paricalcitol  NEUROLOGIC  A:  No acute issues P:   Start PT/OT   BEST PRACTICE / DISPOSITION Level of Care:  Downgrade to telemetry under TR[facility] Primary Service:  PCCM Consultants:  Cardiology Code Status:  Full Diet:  Heart Healthy DVT Px:  Hep GI Px:  No indicated Skin Integrity:  intact Social / Family:  Updated 7/17  MERRELL, DAVID, M.D. Family Medicine  PGY-2 09/09/2011, 9:45 AM  Patient examined.  Records reviewed.  Assessment and plan above is edited and discussed with ICU Resident Team.  Orlean Bradford, M.D., F.C.C.P. Pulmonary and Critical Care Medicine [facility] Cell: [phone] Pager: [phone]

## 2011-09-10 ENCOUNTER — Inpatient Hospital Stay (HOSPITAL_COMMUNITY): Payer: Medicare Other

## 2011-09-10 DIAGNOSIS — G4733 Obstructive sleep apnea (adult) (pediatric): Secondary | ICD-10-CM

## 2011-09-10 DIAGNOSIS — I5041 Acute combined systolic (congestive) and diastolic (congestive) heart failure: Secondary | ICD-10-CM

## 2011-09-10 DIAGNOSIS — J449 Chronic obstructive pulmonary disease, unspecified: Secondary | ICD-10-CM

## 2011-09-10 LAB — GLUCOSE, CAPILLARY
Glucose-Capillary: 99 mg/dL (ref 70–99)
Glucose-Capillary: 97 mg/dL (ref 70–99)
Glucose-Capillary: 106 mg/dL — ABNORMAL HIGH (ref 70–99)
Glucose-Capillary: 147 mg/dL — ABNORMAL HIGH (ref 70–99)
Glucose-Capillary: 140 mg/dL — ABNORMAL HIGH (ref 70–99)

## 2011-09-10 LAB — BASIC METABOLIC PANEL
CO2: 32 mEq/L (ref 19–32)
Calcium: 8.5 mg/dL (ref 8.4–10.5)
GFR calc Af Amer: 28 mL/min — ABNORMAL LOW (ref >90)
GFR calc non Af Amer: 24 mL/min — ABNORMAL LOW (ref >90)
Sodium: 141 mEq/L (ref 135–145)

## 2011-09-10 LAB — CBC
MCH: 24.4 pg — ABNORMAL LOW (ref 26.0–34.0)
Platelets: 180 10*3/uL (ref 150–400)
RBC: 4.64 MIL/uL (ref 3.87–5.11)
RDW: 17.9 % — ABNORMAL HIGH (ref 11.5–15.5)

## 2011-09-10 MED ORDER — HYDRALAZINE HCL 50 MG PO TABS
50.0000 mg | ORAL_TABLET | Freq: Three times a day (TID) | ORAL | Status: DC
  Administered 2011-09-10 – 2011-09-11 (×3): 50 mg via ORAL
  Filled 2011-09-10 (×6): qty 1

## 2011-09-10 MED ORDER — FUROSEMIDE 10 MG/ML IJ SOLN
40.0000 mg | Freq: Three times a day (TID) | INTRAMUSCULAR | Status: DC
  Administered 2011-09-10 – 2011-09-13 (×8): 40 mg via INTRAVENOUS
  Filled 2011-09-10 (×11): qty 4

## 2011-09-10 MED ORDER — IPRATROPIUM BROMIDE 0.02 % IN SOLN
0.5000 mg | Freq: Four times a day (QID) | RESPIRATORY_TRACT | Status: DC
  Administered 2011-09-10 – 2011-09-14 (×15): 0.5 mg via RESPIRATORY_TRACT
  Filled 2011-09-10 (×16): qty 2.5

## 2011-09-10 MED ORDER — INSULIN ASPART 100 UNIT/ML ~~LOC~~ SOLN
0.0000 [IU] | Freq: Three times a day (TID) | SUBCUTANEOUS | Status: DC
  Administered 2011-09-11: 2 [IU] via SUBCUTANEOUS
  Administered 2011-09-11: 5 [IU] via SUBCUTANEOUS
  Administered 2011-09-11: 2 [IU] via SUBCUTANEOUS
  Administered 2011-09-13: 5 [IU] via SUBCUTANEOUS
  Administered 2011-09-14 – 2011-09-16 (×3): 2 [IU] via SUBCUTANEOUS

## 2011-09-10 MED ORDER — IPRATROPIUM BROMIDE 0.02 % IN SOLN: 0.5000 mg | Freq: Four times a day (QID) | RESPIRATORY_TRACT | Status: DC

## 2011-09-10 MED ORDER — ALBUTEROL SULFATE (5 MG/ML) 0.5% IN NEBU: 2.5000 mg | INHALATION_SOLUTION | RESPIRATORY_TRACT | Status: DC

## 2011-09-10 MED ORDER — ALBUTEROL SULFATE (5 MG/ML) 0.5% IN NEBU
2.5000 mg | INHALATION_SOLUTION | Freq: Four times a day (QID) | RESPIRATORY_TRACT | Status: DC
  Administered 2011-09-10 – 2011-09-14 (×15): 2.5 mg via RESPIRATORY_TRACT
  Filled 2011-09-10 (×16): qty 0.5

## 2011-09-10 MED ORDER — INSULIN ASPART 100 UNIT/ML ~~LOC~~ SOLN
0.0000 [IU] | Freq: Every day | SUBCUTANEOUS | Status: DC
  Administered 2011-09-12: 5 [IU] via SUBCUTANEOUS

## 2011-09-10 MED ORDER — DEXTROSE 5 % IV SOLN
0.0100 ug/kg/min | INTRAVENOUS | Status: DC
  Administered 2011-09-10 – 2011-09-11 (×2): 0.01 ug/kg/min via INTRAVENOUS
  Filled 2011-09-10 (×5): qty 5

## 2011-09-10 NOTE — Progress Notes (Signed)
OT Cancellation Note  Treatment cancelled today due to patient's refusal to participate. Pt states "I am comfortable and I will rest awhile." Pt provided education on reason for OT evaluation and MD order. Pt states "they get paid for it anyway" in response to OT education on MD needing OT evaluation.  Pt verbalized d/cing to SNF and daughter making decisions with social work regarding this matter. Pt is limited by personal participation and declining to engage with OT and PTA.  Harrel Carina Baldwin Pager: [phone]  09/10/2011, 2:08 PM

## 2011-09-10 NOTE — Progress Notes (Signed)
Subjective: 71 y/o female admitted on 7/15 with worsening shortness of breath, she is on home oxygen with limited mobility due to underlying COPD. 2D echo in the hospital revealed EF 25-30%,  And grade 3 diastolic dysfunction. Patient was found to have elevated BNP and was started on IV lasix. Patient did not improve and CCM was consulted, she was transferred to ICU for acute on chronic hypercarbic respiratory failure. Patient was put on Bipap and started on po prednisone. Cardiology was consulted and is continued on iv lasix. 7/19: Patient still has mild dyspnea at rest. Foley catheter is out, urine culture is negative.  Objective: Vital signs in last 24 hours: Temp:  [96.9 F (36.1 C)-98.3 F (36.8 C)] 97.1 F (36.2 C) (07/19 0503) Pulse Rate:  [66-91] 66  (07/19 0503) Resp:  [18-24] 20  (07/19 0503) BP: (128-169)/(66-91) 164/84 mmHg (07/19 0503) SpO2:  [92 %-100 %] 96 % (07/19 0828) Weight:  [98.476 kg (217 lb 1.6 oz)] 98.476 kg (217 lb 1.6 oz) (07/19 0503) Weight change:  Last BM Date: 09/07/11  Intake/Output from previous day: 07/18 0701 - 07/19 0700 In: 1115 [P.O.:1080; I.V.:3; IV Piggyback:12] Out: 1750 [Urine:1750] Total I/O In: 240 [P.O.:240] Out: -    Physical Exam: HEENT: Atraumatic, normocephalic Neck: Supple Chest : Bibasilar crackles Heart : S1 S2 Regular, no murmurs Abdomen: Soft, Nontender, no organomegaly Ext : No cyanosis, clubbing, edema   Lab Results: Basic Metabolic Panel:  Basename 09/10/11 0529 09/09/11 0837 09/07/11 1932  NA 141 139 --  K 3.9 4.3 --  CL 102 102 --  CO2 32 31 --  GLUCOSE 110* 133* --  BUN 35* 30* --  CREATININE 1.98* 2.11* --  CALCIUM 8.5 8.4 --  MG -- 1.2* 1.3*  PHOS -- -- --   Liver Function Tests:  [facility] 09/07/11 1932  AST 13  ALT 9  ALKPHOS 58  BILITOT 0.4  PROT 5.4*  ALBUMIN 2.1*   No results found for this basename: LIPASE:2,AMYLASE:2 in the last 72 hours No results found for this basename: AMMONIA:2 in the  last 72 hours CBC:  Basename 09/10/11 0529 09/09/11 0837  WBC 7.6 6.7  NEUTROABS -- --  HGB 11.3* 11.6*  HCT 37.5 38.9  MCV 80.8 82.6  PLT 180 166   Cardiac Enzymes:  Basename 09/07/11 1931  CKTOTAL 44  CKMB 3.1  CKMBINDEX --  TROPONINI <0.30   BNP:  Basename 09/08/11 0350  PROBNP 34331.0*   D-Dimer: No results found for this basename: DDIMER:2 in the last 72 hours CBG:  Basename 09/10/11 0605 09/09/11 2356 09/09/11 1555 09/09/11 1417 09/09/11 1202 09/09/11 0804  GLUCAP 99 164* 130* 181* 129* 132*   Urine Drug Screen: Drugs of Abuse  No results found for this basename: labopia, cocainscrnur, labbenz, amphetmu, thcu, labbarb    Alcohol Level: No results found for this basename: ETH:2 in the last 72 hours Urinalysis:  Basename 09/09/11 1251  COLORURINE YELLOW  LABSPEC 1.013  PHURINE 5.0  GLUCOSEU NEGATIVE  HGBUR MODERATE*  BILIRUBINUR NEGATIVE  KETONESUR NEGATIVE  PROTEINUR 100*  UROBILINOGEN 0.2  NITRITE NEGATIVE  LEUKOCYTESUR LARGE*    Recent Results (from the past 240 hour(s))  URINE CULTURE     Status: Normal   Collection Time   09/06/11  8:46 AM      Component Value Range Status Comment   Specimen Description URINE, CATHETERIZED   Final    Special Requests NONE   Final    Culture  Setup Time 09/06/2011 09:16  Final    Colony Count NO GROWTH   Final    Culture NO GROWTH   Final    Report Status 09/07/2011 FINAL   Final   MRSA PCR SCREENING     Status: Normal   Collection Time   09/07/11  6:04 PM      Component Value Range Status Comment   MRSA by PCR NEGATIVE  NEGATIVE Final     Studies/Results: Dg Chest Port 1 View  09/10/2011  *RADIOLOGY REPORT*  Clinical Data: Respiratory distress.  PORTABLE CHEST - 1 VIEW  Comparison: Portable chest 09/09/2011.  Findings: The heart is enlarged.  Retrocardiac opacification is stable.  Pulmonary vascular congestion and bilateral pleural effusions remain.  Mild right basilar airspace disease is unchanged.   IMPRESSION:  1.  Stable appearance of retrocardiac opacification concerning for atelectasis or infection. 2.  Persistent bilateral pleural effusions. 3.  Stable cardiomegaly and pulmonary vascular congestion. 4.  Mild atelectasis versus infection at the right lung base is stable.  Original Report Authenticated By: Jamesetta Orleans. MATTERN, M.D.   Dg Chest Port 1 View  09/09/2011  *RADIOLOGY REPORT*  Clinical Data: Acute respiratory failure.  PORTABLE CHEST - 1 VIEW  Comparison: Chest x-ray 09/08/2011.  Findings: Lung volumes are low. There is cephalization of the pulmonary vasculature and slight indistinctness of the interstitial markings suggestive of mild pulmonary edema.  In addition, there are superimposed asymmetric opacities in the right mid and lower lung, and in the left lower lung, concerning for sequela of aspiration or superimposed pneumonia.  Moderate bilateral pleural effusions are noted.  Linear plate-like atelectasis in the left mid lung.  Heart size remains mildly enlarged (unchanged). The patient is rotated to the left on today's exam, resulting in distortion of the mediastinal contours and reduced diagnostic sensitivity and specificity for mediastinal pathology.  Atherosclerosis in the thoracic aorta.  IMPRESSION: 1. Findings are again suggestive of multilobar pneumonia or sequelae of aspiration. 2.  Superimposed mild pulmonary edema is suspected. 3.  Moderate bilateral pleural effusions (right greater than left) are unchanged. 4.  Atherosclerosis.  Original Report Authenticated By: Florencia Reasons, M.D.    Medications: Scheduled Meds:   . albuterol  2.5 mg Nebulization QID   And  . ipratropium  0.5 mg Nebulization QID  . amLODipine  5 mg Oral Daily  . aspirin EC  81 mg Oral Daily  . atorvastatin  10 mg Oral q1800  . carvedilol  12.5 mg Oral BID WC  . carvedilol  6.25 mg Oral Once  . furosemide  40 mg Intravenous Q12H  . heparin subcutaneous  5,000 Units Subcutaneous Q8H  .  hydrALAZINE  25 mg Oral Q8H  . insulin aspart  0-15 Units Subcutaneous TID WC  . insulin aspart  0-5 Units Subcutaneous QHS  . magnesium chloride  2 tablet Oral Daily  . magnesium sulfate 1 - 4 g bolus IVPB  2 g Intravenous Once  . moxifloxacin  400 mg Oral Q2000  . nicotine  21 mg Transdermal Daily  . paricalcitol  1 mcg Oral Daily  . predniSONE  50 mg Oral Q breakfast  . sodium chloride  3 mL Intravenous Q12H  . DISCONTD: albuterol  2.5 mg Nebulization Q4H  . DISCONTD: albuterol  2.5 mg Nebulization Q4H  . DISCONTD: carvedilol  6.25 mg Oral BID WC  . DISCONTD: insulin aspart  0-15 Units Subcutaneous TID WC  . DISCONTD: ipratropium  0.5 mg Nebulization Q4H  . DISCONTD: ipratropium  0.5 mg Nebulization  QID  . DISCONTD: magnesium chloride  2 tablet Oral Daily  . DISCONTD: magnesium sulfate 1 - 4 g bolus IVPB  2 g Intravenous Once   Continuous Infusions:  PRN Meds:.acetaminophen, albuterol, ipratropium  Assessment/Plan:   Acute on chronic respiratory failure with hypercapnia Multifactorial, COPD, Diastolic dysfunction, systolic dysfunction Continue IV lasix Off Bipap Will reinsert foley for diuresis Will taper off prednisone Continue Avelox Nebulizers prn   Type 2 diabetes mellitus with vascular disease Continue SSI BG well controlled Taper off steroids   Hypertensive heart disease with congestive heart failure Continue lasix Coreg, lipitor, ASA  Chronic kidney disease (CKD), stage III Cr is improving with diuresis Cont to monitor  DVT prophylaxis Heparin    LOS: 4 days   [facility] Pager: [phone] 09/10/2011, 9:52 AM

## 2011-09-10 NOTE — Progress Notes (Signed)
PT Cancel Note:   Pt refusing therapy this PM despite max encouragement & discussing benefits of participating in therapy.    Verdell Face, [place] [phone] 09/10/2011

## 2011-09-10 NOTE — Progress Notes (Signed)
Thank you to [name] for this referral.  Chart Review Complete.  Went to bedside to provide services overview.  Patient was on a call.  I will follow up with her on Monday or at her designated SNF post discharge if she discharges prior to Monday. For any additional questions or new referrals please contact Anibal Henderson BSN RN [facility] Liaison at [phone].

## 2011-09-10 NOTE — Progress Notes (Signed)
Subjective:  Transferred out of unit.  Not SOB at rest.  C/o vague chest pain when she bends over.  Diuresing  Objective:  Vital Signs in the last 24 hours: BP 134/76  Pulse 66  Temp 97.1 F (36.2 C) (Axillary)  Resp 20  Ht 5\' 4"  (1.626 m)  Wt 98.476 kg (217 lb 1.6 oz)  BMI 37.27 kg/m2  SpO2 96%  Physical Exam: Very large BF sitting up at bedside in chair Lungs:  Clear with reduced breath sounds Cardiac:  Regular rhythm, normal S1 and S2, no S3, 1-2/6 systolic murmur Abdomen:  Very large soft and tnontender Extremities:  3+ edema present  Intake/Output from previous day: 07/18 0701 - 07/19 0700 In: 1115 [P.O.:1080; I.V.:3; IV Piggyback:12] Out: 1750 [Urine:1750] Weight Filed Weights   09/06/11 1150 09/07/11 0500 09/10/11 0503  Weight: 102.5 kg (225 lb 15.5 oz) 100.154 kg (220 lb 12.8 oz) 98.476 kg (217 lb 1.6 oz)    Lab Results: Basic Metabolic Panel:  Basename 09/10/11 0529 09/09/11 0837  NA 141 139  K 3.9 4.3  CL 102 102  CO2 32 31  GLUCOSE 110* 133*  BUN 35* 30*  CREATININE 1.98* 2.11*    CBC:  Basename 09/10/11 0529 09/09/11 0837  WBC 7.6 6.7  NEUTROABS -- --  HGB 11.3* 11.6*  HCT 37.5 38.9  MCV 80.8 82.6  PLT 180 166    BNP    Component Value Date/Time   PROBNP 34331.0* 09/08/2011 0350   Telemetry: Sinus rhythm with some PAC's, PVC's.  Had accelerated junctional rhtyhm last night and Coreg inrcreased Assessment/Plan:  1. Acute on chronic respiratory failure 2. Mixed picture cardiomyopathy not good candidate for aggressive w/u at present 3. Chronic kidney disease stage 4 but improvement since here off of ACE 4. Hypertensive heart disease with CHF still not well controlled 5. Acute on chronic systolic CHF Still volume overloaded  Rec:   She needs to be diuresed as long as BP and renal failure will allow.  She at the present time is a poor candidate for invasive cardiac evaluation and should be treated medically.  Increase hydralazine.  Since  renal function improving off of ACE leave off for now.  May need to add spironolactone later when renal allows.  She still needs significant diuresis.   I think she is high risk for readmission and would suggest getting the advanced heart failure team involved. I will be away over the next week and [facility]iology / CHF team will see.    Felicia Palmer  MD [facility]  09/10/2011, 1:16 PM

## 2011-09-10 NOTE — Progress Notes (Signed)
09/09/11 Around 1952 Patient had a change in hear rate and rhythm. Patient heart rate had increased to 150 non-sutained while nurse tech was getting vitals and EKG. Prior to increase in HR the patient was in accelerated junctional before the nonsustained HR increase afterwards she was in normal sinus rhythm. Dr.Albon was called and notified, I spoke with her and was told to call Dr. Donnie Aho. MD on call for Donnie Aho was Dr.Skains. Dr.Skains modified coreg medication dosage. Was told to call back if any other changes in rhythm occur. Patient had another change in heart rhythm from NSR to accelerated junctional this am around 0100 and MD on call for [facility] Dr. Tresa Endo was notified. Patient HR was around 93 to 94 bpm. No new orders given but notify physician if heart rate continues to increase and/or change in heart rhythm.

## 2011-09-10 NOTE — Progress Notes (Signed)
Pt's cardiologist Dr. Shirlee Latch ordered pt to start on Natrecor and wanted to be transferred to the unit.  Bed available at 2919 oncoming nurse notified and also RRT informed as well as respiratory  Notified of pt order to transfer and order for Bipap.  RT currently in room with pt.  Amanda Pea, Charity fundraiser.

## 2011-09-10 NOTE — Consult Note (Signed)
Advanced Heart Failure Team Consult Note  Referring Physician: Dr. Donnie Aho Primary Physician: Dr. Dorothyann Peng Primary Cardiologist:   Pulmonologist: Dr. Vassie Loll  Reason for Consultation: HF management in patient at increased risk for re-admit    HPI:    [name] is a 71 y.o. 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  She states she did stop smoking 1-2 months ago.  She has not had close follow up or compliance with home meds.   She did follow with a cardiologist years ago for hypertension and angina and states she had a cath at that point in time that was normal.   She uses continuous O2 but states that over the last couple of months she has noted increased dyspnea with minimal exertion.  She is unable to complete small chores like washing dishes without taking breaks.  She also endorses lower extremity edema.  She sleeps in a hospital bed with 2 pillows.  She wears her O2 during the night and denies PND.  She has pain under her left breast when she bends over to tie her shoe but otherwise no chest pain.  No recent fever /chills.  She lives at home by herself although her daughter is close by and does bring her food and take her to the grocery store.  Around home she uses a cane or walker to get around but out in public a wheelchair.   She was admitted to [facility] on 7/15.  Pertinent labs at the time: ProBNP 34,331, Cr 2.19, BUN 25 and CXR with bibasilar air space disease and probable bilateral pleural effusions.  She was started on IV lasix with a total of 1.8 liters out since admit.  On 7/16 the patient was noted to be lethargic and ABG demonstrated hypercarbic respiratory failure.  Bipap,  nebs/prednisone and IV lasix used with improvement in symptoms.  Bipap was weaned and currently on n/c.  Echo 09/06/11 showed LVEF 25-30%.  Moderate concentric hypertrophy.  Akinesis of the entire anteroseptal myocardium.  Grade 3 diastolic dysfunction.  Mild MR.  Severely dilated LA.  RV systolic function mod reduced with moderately dilated.  RA severely dilated.  PAPP 92 mmHG.   She has been continued on IV lasix, Cr stable 1.98.  With renal failure ACE-I held with mild improvement. A renal ultrasound was obtained on 09/07/11 showing bilateral renal cysts.  No hydronephrosis. Calcification in the hilar region probably represents vascular calcifications.  Carvedilol and hydralazine were added to patient's regimen.  Her SBP remains elevated 140-160s.  CXR today showed stable appearance of retrocardiac opacification concerning for atelectasis or infection with persistent bilateral pleural effusions.   She says her breathing has improved mildly but she remains dyspneic with any activity.       Review of Systems: [y] = yes, [ ]  = no   General: Weight gain Cove.Etienne ]; Weight loss [ ] ; Anorexia [ ] ; Fatigue Cove.Etienne ]; Fever [ ] ; Chills [ ] ; Weakness [ ]   Cardiac: Chest pain/pressure [ ] ; Resting SOB Cove.Etienne ]; Exertional SOB Cove.Etienne ]; Orthopnea Cove.Etienne ]; Pedal Edema [ y]; Palpitations [ ] ; Syncope [ ] ; Presyncope [ ] ; Paroxysmal nocturnal dyspnea[ ]   Pulmonary: Cough [ ] ; Wheezing[ ] ; Hemoptysis[ ] ; Sputum [ ] ; Snoring [ ]   GI: Vomiting[ ] ; Dysphagia[ ] ; Melena[ ] ; Hematochezia [ ] ; Heartburn[ ] ; Abdominal pain [ ] ; Constipation [ ] ; Diarrhea [ ] ; BRBPR [ ]   GU: Hematuria[ ] ; Dysuria [ ] ; Nocturia[ ]   Vascular: Pain in legs with walking [ ] ; Pain in feet with lying flat [ ] ; Non-healing sores [ ] ; Stroke [ ] ; TIA [ ] ; Slurred speech [ ] ;  Neuro: Headaches[ ] ; Vertigo[ ] ; Seizures[ ] ; Paresthesias[ ] ;Blurred vision [ ] ; Diplopia [ ] ; Vision changes [ ]   Ortho/Skin: Arthritis [ ] ; Joint pain [ ] ; Muscle pain [ ] ; Joint swelling [ ] ;  Back Pain [ ] ; Rash [ ]   Psych: Depression[ ] ; Anxiety[ ]   Heme: Bleeding problems [ ] ; Clotting disorders [ ] ; Anemia [ ]   Endocrine: Diabetes Cove.Etienne ]; Thyroid dysfunction[ ]   Home Medications Prior to Admission medications   Medication Sig Start Date End Date Taking? Authorizing Provider  amLODipine (NORVASC) 10 MG tablet Take 10 mg by mouth daily.   Yes Historical Provider, MD  fenofibrate (TRICOR) 145 MG tablet Take 145 mg by mouth daily.   Yes Historical Provider, MD  meclizine (ANTIVERT) 25 MG tablet Take 25 mg by mouth 3 (three) times daily as needed. For dizziness   Yes Historical Provider, MD  OVER THE COUNTER MEDICATION Take 1 tablet by mouth daily. Basic Vitamin   Yes Historical Provider, MD  paricalcitol (ZEMPLAR) 1 MCG capsule Take 1 mcg by mouth daily.   Yes Historical Provider, MD    Past Medical History: Past Medical History  Diagnosis Date  . COPD (chronic obstructive pulmonary disease)     on home o2  . Chronic renal insufficiency   . Diabetes mellitus     type 2  . CVA (cerebral infarction)     hx  . Hypertensive heart disease with congestive heart failure   . Chronic cor pulmonale   . Cardiomyopathy of undetermined type 09/07/2011  . Chronic kidney disease (CKD), stage IV (severe)   . Hyperlipdemia   . Obesity (BMI 30-39.9)   . Sleep apnea   . History of gout 09/07/2011    Past Surgical History: Past Surgical History  Procedure Date  . Bilateral oophorectomy     Family History: No family history on file. Mother died at age 69 from CVA. Father died in early 40s from MI.   Social History: History   Social History  . Marital Status: Widowed    Spouse Name: N/A    Number of Children: N/A  . Years of Education: N/A   Social History Main Topics  . Smoking status: Current Everyday Smoker -- 1.0 packs/day  . Smokeless tobacco: None  . Alcohol Use: No  . Drug Use: No  . Sexually Active: No   Other Topics Concern  . None   Social History Narrative    Widow,  lives alone  Lives alone, though close to daughter.  Stopped smoking 1-2 months ago.   Allergies:  Allergies  Allergen Reactions  . Sulfonamide Derivatives Hives and Rash    Objective:    Vital Signs:   Temp:  [96.9 F (36.1 C)-98.1 F (36.7 C)] 98.1 F (36.7 C) (07/19 1804) Pulse Rate:  [66-91] 66  (07/19 1804) Resp:  [18-22] 22  (07/19 1804) BP: (134-169)/(61-91) 151/61 mmHg (07/19 1804) SpO2:  [92 %-100 %] 94 % (07/19 1804) Weight:  [217 lb 1.6 oz (98.476 kg)] 217 lb 1.6 oz (98.476 kg) (07/19 0503) Last BM Date: 09/07/11  Weight change: [employer]  09/06/11 1150 09/07/11 0500 09/10/11 0503  Weight: 225 lb 15.5 oz (102.5 kg) 220 lb 12.8 oz (100.154 kg) 217 lb 1.6 oz (98.476 kg)    Intake/Output:   Intake/Output Summary (Last 24 hours) at 09/10/11 1816 Last data filed at 09/10/11 1810  Gross per 24 hour  Intake   1179 ml  Output   2476 ml  Net  -1297 ml     Physical Exam: General:  Chronically ill appearing. Tachypneic on 2 L n/c HEENT: normal Neck: supple. JVP 14-16 cm. Carotids 2+ bilat; no bruits. No lymphadenopathy or thryomegaly appreciated. Cor: Distant.  Regular rate & rhythm. No rubs, gallops.  2/6 systolic murmur LLSB. Lungs: distant with crackles at bases.  Abdomen: soft, nontender, nondistended. No hepatosplenomegaly. No bruits or masses. Good bowel sounds. Extremities: no cyanosis, clubbing, rash, 1-2+ edema to knee bilaterally.  Neuro: alert & orientedx3, cranial nerves grossly intact. moves all 4 extremities w/o difficulty. Affect pleasant  Telemetry: NSR  Labs: Basic Metabolic Panel:  Lab 09/10/11 2440 09/09/11 0837 09/08/11 0350 09/07/11 1932 09/07/11 0535  NA 141 139 146* 143 144  K 3.9 4.3 4.1 4.1 4.1  CL 102 102 107 107 108  CO2 32 31 33* 31 29  GLUCOSE 110* 133* 75 90 80  BUN 35* 30* 24* 24* 25*  CREATININE 1.98* 2.11* 2.28* 2.29* 2.27*  CALCIUM 8.5 8.4 8.5 -- --  MG -- 1.2* -- 1.3* --  PHOS -- -- -- -- --    Liver  Function Tests:  Lab 09/07/11 1932 09/06/11 1258  AST 13 14  ALT 9 10  ALKPHOS 58 65  BILITOT 0.4 0.4  PROT 5.4* 6.0  ALBUMIN 2.1* 2.5*   No results found for this basename: LIPASE:5,AMYLASE:5 in the last 168 hours No results found for this basename: AMMONIA:3 in the last 168 hours  CBC:  Lab 09/10/11 0529 09/09/11 0837 09/08/11 0350 09/07/11 1932 09/07/11 0535 09/06/11 0739  WBC 7.6 6.7 5.2 6.3 6.9 --  NEUTROABS -- -- -- -- -- 4.8  HGB 11.3* 11.6* 11.3* 11.3* 11.5* --  HCT 37.5 38.9 39.3 39.3 41.1 --  MCV 80.8 82.6 85.8 86.4 86.7 --  PLT 180 166 150 163 182 --    Cardiac Enzymes:  Lab 09/07/11 1931 09/06/11 0739  CKTOTAL 44 --  CKMB 3.1 --  CKMBINDEX -- --  TROPONINI <0.30 <0.30    BNP: BNP (last 3 results)  Basename 09/08/11 0350 09/06/11 0740  PROBNP 34331.0* 49670.0*    CBG:  Lab 09/10/11 1621 09/10/11 1117 09/10/11 0605 09/09/11 2356 09/09/11 1555  GLUCAP 106* 97 99 164* 130*    Coagulation Studies: No results found for this basename: LABPROT:5,INR:5 in the last 72 hours  Other results: EKG: SR with first degree AVB, RBBB and LAD  Imaging: Dg Chest Port 1 View  09/10/2011  *RADIOLOGY REPORT*  Clinical Data: Respiratory distress.  PORTABLE CHEST - 1 VIEW  Comparison: Portable chest 09/09/2011.  Findings: The heart is enlarged.  Retrocardiac opacification is stable.  Pulmonary vascular congestion and bilateral pleural effusions remain.  Mild right basilar airspace disease is unchanged.  IMPRESSION:  1.  Stable appearance of retrocardiac opacification concerning for atelectasis or infection. 2.  Persistent bilateral pleural effusions. 3.  Stable cardiomegaly and pulmonary vascular congestion. 4.  Mild atelectasis versus infection at the right lung base is stable.  Original Report Authenticated By: Jamesetta Orleans. MATTERN, M.D.   Dg Chest Port 1 View  09/09/2011  *RADIOLOGY REPORT*  Clinical Data: Acute  respiratory failure.  PORTABLE CHEST - 1 VIEW   Comparison: Chest x-ray 09/08/2011.  Findings: Lung volumes are low. There is cephalization of the pulmonary vasculature and slight indistinctness of the interstitial markings suggestive of mild pulmonary edema.  In addition, there are superimposed asymmetric opacities in the right mid and lower lung, and in the left lower lung, concerning for sequela of aspiration or superimposed pneumonia.  Moderate bilateral pleural effusions are noted.  Linear plate-like atelectasis in the left mid lung.  Heart size remains mildly enlarged (unchanged). The patient is rotated to the left on today's exam, resulting in distortion of the mediastinal contours and reduced diagnostic sensitivity and specificity for mediastinal pathology.  Atherosclerosis in the thoracic aorta.  IMPRESSION: 1. Findings are again suggestive of multilobar pneumonia or sequelae of aspiration. 2.  Superimposed mild pulmonary edema is suspected. 3.  Moderate bilateral pleural effusions (right greater than left) are unchanged. 4.  Atherosclerosis.  Original Report Authenticated By: Florencia Reasons, M.D.     Medications:     Current Medications:    . albuterol  2.5 mg Nebulization QID   And  . ipratropium  0.5 mg Nebulization QID  . aspirin EC  81 mg Oral Daily  . atorvastatin  10 mg Oral q1800  . carvedilol  12.5 mg Oral BID WC  . carvedilol  6.25 mg Oral Once  . furosemide  40 mg Intravenous Q8H  . heparin subcutaneous  5,000 Units Subcutaneous Q8H  . hydrALAZINE  50 mg Oral Q8H  . insulin aspart  0-15 Units Subcutaneous TID WC  . insulin aspart  0-5 Units Subcutaneous QHS  . magnesium chloride  2 tablet Oral Daily  . magnesium sulfate 1 - 4 g bolus IVPB  2 g Intravenous Once  . moxifloxacin  400 mg Oral Q2000  . nicotine  21 mg Transdermal Daily  . paricalcitol  1 mcg Oral Daily  . predniSONE  50 mg Oral Q breakfast  . sodium chloride  3 mL Intravenous Q12H  . DISCONTD: albuterol  2.5 mg Nebulization Q4H  . DISCONTD:  albuterol  2.5 mg Nebulization Q4H  . DISCONTD: amLODipine  5 mg Oral Daily  . DISCONTD: carvedilol  6.25 mg Oral BID WC  . DISCONTD: furosemide  40 mg Intravenous Q12H  . DISCONTD: hydrALAZINE  25 mg Oral Q8H  . DISCONTD: insulin aspart  0-15 Units Subcutaneous TID WC  . DISCONTD: ipratropium  0.5 mg Nebulization Q4H  . DISCONTD: ipratropium  0.5 mg Nebulization QID  . DISCONTD: magnesium chloride  2 tablet Oral Daily  . DISCONTD: magnesium sulfate 1 - 4 g bolus IVPB  2 g Intravenous Once    Infusions:    . nesiritide (NATRECOR) infusion       Assessment/Plan:   1. Acute on chronic respiratory failure: acute issue resolved.  Continue O2 2. Acute systolic and diastolic heart failure, EF 20-25%: This may be an ischemic cardiomyopathy given septal wall motion abnormality on echo.  She remains quite volume overloaded - Add nesiritide 0.01 mcg/kg/min gtt with no bolus for afterload reduction/improvement in cardiac output with HTN and ARF.  Will stop amlodipine for now with addition of nesiritide.  - Increase lasix 40 mg IV every 8 hours.  - Follow renal function closely - I would aim towards RHC on Monday to assess filling pressures, PA pressure, and cardiac output.  Would hold off on LHC for now with renal dysfunction though I am concerned for ischemic CMP.  3. 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  She is on ASA and statin.  4. Acute on chronic renal failure, probably cardiorenal.  - add nesiritide, as above  - follow closely.  5. Obesity hypoventilation syndrome - sleep study will need to be scheduled 6. COPD with continuous O2  7. HTN 8. Hypomagnesium - repleted 9. Pulmonary HTN: Severe by echo.  Suspect mixed pulmonary arterial and pulmonary venous HTN => elevated left heart filling pressures to cause pulmonary venous HTN and OSA/OHS to cause pulmonary arterial HTN component.  Will need RHC to confirm.   Full Code   Length of Stay: 4  [name] 09/10/2011 6:23 PM

## 2011-09-11 DIAGNOSIS — I428 Other cardiomyopathies: Secondary | ICD-10-CM

## 2011-09-11 LAB — CBC
HCT: 37 % (ref 36.0–46.0)
Hemoglobin: 11.3 g/dL — ABNORMAL LOW (ref 12.0–15.0)
MCH: 24.3 pg — ABNORMAL LOW (ref 26.0–34.0)
MCHC: 30.5 g/dL (ref 30.0–36.0)
MCV: 79.6 fL (ref 78.0–100.0)

## 2011-09-11 LAB — COMPREHENSIVE METABOLIC PANEL
BUN: 41 mg/dL — ABNORMAL HIGH (ref 6–23)
CO2: 31 mEq/L (ref 19–32)
Calcium: 8.3 mg/dL — ABNORMAL LOW (ref 8.4–10.5)
Creatinine, Ser: 1.96 mg/dL — ABNORMAL HIGH (ref 0.50–1.10)
GFR calc Af Amer: 28 mL/min — ABNORMAL LOW (ref >90)
GFR calc non Af Amer: 25 mL/min — ABNORMAL LOW (ref >90)
Glucose, Bld: 102 mg/dL — ABNORMAL HIGH (ref 70–99)
Total Protein: 5.1 g/dL — ABNORMAL LOW (ref 6.0–8.3)

## 2011-09-11 LAB — GLUCOSE, CAPILLARY: Glucose-Capillary: 142 mg/dL — ABNORMAL HIGH (ref 70–99)

## 2011-09-11 MED ORDER — HYDRALAZINE HCL 50 MG PO TABS: 75.0000 mg | ORAL_TABLET | Freq: Three times a day (TID) | ORAL | Status: DC

## 2011-09-11 MED ORDER — HYDRALAZINE HCL 50 MG PO TABS
50.0000 mg | ORAL_TABLET | Freq: Three times a day (TID) | ORAL | Status: DC
  Administered 2011-09-11 – 2011-09-16 (×16): 50 mg via ORAL
  Filled 2011-09-11 (×19): qty 1

## 2011-09-11 MED ORDER — DEXTROSE 5 % IV SOLN
3.0000 g | Freq: Once | INTRAVENOUS | Status: AC
  Administered 2011-09-11: 3 g via INTRAVENOUS
  Filled 2011-09-11: qty 6

## 2011-09-11 MED ORDER — SODIUM CHLORIDE 0.9 % IV SOLN: INTRAVENOUS | Status: DC

## 2011-09-11 MED ORDER — MAGNESIUM SULFATE 50 % IJ SOLN: 3.0000 g | Freq: Once | INTRAMUSCULAR | Status: DC

## 2011-09-11 MED ORDER — ISOSORBIDE MONONITRATE ER 60 MG PO TB24
60.0000 mg | ORAL_TABLET | Freq: Every day | ORAL | Status: DC
  Administered 2011-09-11 – 2011-09-16 (×6): 60 mg via ORAL
  Filled 2011-09-11 (×6): qty 1

## 2011-09-11 MED ORDER — PREDNISONE 20 MG PO TABS
40.0000 mg | ORAL_TABLET | Freq: Every day | ORAL | Status: DC
  Administered 2011-09-12 – 2011-09-13 (×2): 40 mg via ORAL
  Filled 2011-09-11 (×3): qty 2

## 2011-09-11 MED ORDER — ISOSORBIDE MONONITRATE ER 30 MG PO TB24: 30.0000 mg | ORAL_TABLET | Freq: Every day | ORAL | Status: DC

## 2011-09-11 NOTE — Progress Notes (Signed)
Subjective: 71 y/o female admitted on 7/15 with worsening shortness of breath, she is on home oxygen with limited mobility due to underlying COPD. 2D echo in the hospital revealed EF 25-30%,  And grade 3 diastolic dysfunction. Patient was found to have elevated BNP and was started on IV lasix. Patient did not improve and CCM was consulted, she was transferred to ICU for acute on chronic hypercarbic respiratory failure. Patient was put on Bipap and started on po prednisone. Cardiology was consulted and is continued on iv lasix. 7/19: Patient still has mild dyspnea at rest. Foley catheter is out, urine culture is negative. 7/20: Patient was started on nesitiride infusion last night as per CHF team and transferred to step down unit. At this time foley catheter is inserted and lasix has been increased to 40 mg iv q 8 hr.Patient denies dyspnea , and is diuresing well.  Objective: Vital signs in last 24 hours: Temp:  [96.3 F (35.7 C)-98.7 F (37.1 C)] 98 F (36.7 C) (07/20 0812) Pulse Rate:  [51-80] 53  (07/20 0600) Resp:  [16-27] 16  (07/20 0600) BP: (134-167)/(52-119) 151/80 mmHg (07/20 0500) SpO2:  [94 %-100 %] 99 % (07/20 0600) Weight:  [106.7 kg (235 lb 3.7 oz)] 106.7 kg (235 lb 3.7 oz) (07/20 0500) Weight change: 8.224 kg (18 lb 2.1 oz) Last BM Date: 09/10/11  Intake/Output from previous day: 07/19 0701 - 07/20 0700 In: 1186.3 [P.O.:1020; I.V.:162.3; IV Piggyback:4] Out: 2826 [Urine:2825; Stool:1]     Physical Exam: HEENT: Atraumatic, normocephalic Neck: Supple Chest : Bibasilar crackles Heart : S1 S2 Regular, no murmurs Abdomen: Soft, Nontender, no organomegaly Ext : No cyanosis, clubbing, edema   Lab Results: Basic Metabolic Panel:  Basename 09/11/11 0535 09/10/11 0529 09/09/11 0837  NA 140 141 --  K 4.0 3.9 --  CL 101 102 --  CO2 31 32 --  GLUCOSE 102* 110* --  BUN 41* 35* --  CREATININE 1.96* 1.98* --  CALCIUM 8.3* 8.5 --  MG -- -- 1.2*  PHOS -- -- --   Liver  Function Tests:  Basename 09/11/11 0535  AST 11  ALT 6  ALKPHOS 44  BILITOT 0.3  PROT 5.1*  ALBUMIN 2.1*   No results found for this basename: LIPASE:2,AMYLASE:2 in the last 72 hours No results found for this basename: AMMONIA:2 in the last 72 hours CBC:  Basename 09/11/11 0535 09/10/11 0529  WBC 7.2 7.6  NEUTROABS -- --  HGB 11.3* 11.3*  HCT 37.0 37.5  MCV 79.6 80.8  PLT 195 180   Cardiac Enzymes: No results found for this basename: CKTOTAL:3,CKMB:3,CKMBINDEX:3,TROPONINI:3 in the last 72 hours BNP: No results found for this basename: PROBNP:3 in the last 72 hours D-Dimer: No results found for this basename: DDIMER:2 in the last 72 hours CBG:  Basename 09/11/11 0811 09/10/11 2201 09/10/11 2120 09/10/11 1621 09/10/11 1117 09/10/11 0605  GLUCAP 135* 140* 147* 106* 97 99   Urine Drug Screen: Drugs of Abuse  No results found for this basename: labopia,  cocainscrnur,  labbenz,  amphetmu,  thcu,  labbarb    Alcohol Level: No results found for this basename: ETH:2 in the last 72 hours Urinalysis:  Basename 09/09/11 1251  COLORURINE YELLOW  LABSPEC 1.013  PHURINE 5.0  GLUCOSEU NEGATIVE  HGBUR MODERATE*  BILIRUBINUR NEGATIVE  KETONESUR NEGATIVE  PROTEINUR 100*  UROBILINOGEN 0.2  NITRITE NEGATIVE  LEUKOCYTESUR LARGE*    Recent Results (from the past 240 hour(s))  URINE CULTURE     Status: Normal  Collection Time   09/06/11  8:46 AM      Component Value Range Status Comment   Specimen Description URINE, CATHETERIZED   Final    Special Requests NONE   Final    Culture  Setup Time 09/06/2011 09:16   Final    Colony Count NO GROWTH   Final    Culture NO GROWTH   Final    Report Status 09/07/2011 FINAL   Final   MRSA PCR SCREENING     Status: Normal   Collection Time   09/07/11  6:04 PM      Component Value Range Status Comment   MRSA by PCR NEGATIVE  NEGATIVE Final     Studies/Results: Dg Chest Port 1 View  09/10/2011  *RADIOLOGY REPORT*  Clinical Data:  Respiratory distress.  PORTABLE CHEST - 1 VIEW  Comparison: Portable chest 09/09/2011.  Findings: The heart is enlarged.  Retrocardiac opacification is stable.  Pulmonary vascular congestion and bilateral pleural effusions remain.  Mild right basilar airspace disease is unchanged.  IMPRESSION:  1.  Stable appearance of retrocardiac opacification concerning for atelectasis or infection. 2.  Persistent bilateral pleural effusions. 3.  Stable cardiomegaly and pulmonary vascular congestion. 4.  Mild atelectasis versus infection at the right lung base is stable.  Original Report Authenticated By: Jamesetta Orleans. MATTERN, M.D.    Medications: Scheduled Meds:    . albuterol  2.5 mg Nebulization QID   And  . ipratropium  0.5 mg Nebulization QID  . aspirin EC  81 mg Oral Daily  . atorvastatin  10 mg Oral q1800  . carvedilol  12.5 mg Oral BID WC  . furosemide  40 mg Intravenous Q8H  . heparin subcutaneous  5,000 Units Subcutaneous Q8H  . hydrALAZINE  50 mg Oral Q8H  . insulin aspart  0-15 Units Subcutaneous TID WC  . insulin aspart  0-5 Units Subcutaneous QHS  . magnesium chloride  2 tablet Oral Daily  . moxifloxacin  400 mg Oral Q2000  . nicotine  21 mg Transdermal Daily  . paricalcitol  1 mcg Oral Daily  . predniSONE  40 mg Oral Q breakfast  . sodium chloride  3 mL Intravenous Q12H  . DISCONTD: amLODipine  5 mg Oral Daily  . DISCONTD: furosemide  40 mg Intravenous Q12H  . DISCONTD: hydrALAZINE  25 mg Oral Q8H  . DISCONTD: predniSONE  50 mg Oral Q breakfast   Continuous Infusions:    . nesiritide (NATRECOR) infusion 0.01 mcg/kg/min (09/11/11 0600)   PRN Meds:.acetaminophen, albuterol, ipratropium  Assessment/Plan:   Acute on chronic respiratory failure with hypercapnia Multifactorial, COPD, Diastolic dysfunction, systolic dysfunction Continue IV lasix Off Bipap Will taper off prednisone Continue Avelox Nebulizers prn   Type 2 diabetes mellitus with vascular disease Continue  SSI BG well controlled Taper off steroids   Hypertensive heart disease with congestive heart failure Continue lasix Coreg, lipitor, ASA On nesitiride infusion as per heart failure team. Right heart cath as per cardiology on Monday  Chronic kidney disease (CKD), stage III Cr is improving with diuresis ACE on hold Cont to monitor  Hypomagnesemia On slow mag tablets Recheck magnesium  DVT prophylaxis Heparin    LOS: 5 days   [facility] Pager: [phone] 09/11/2011, 8:32 AM

## 2011-09-11 NOTE — Progress Notes (Signed)
Pt. Refused bipap for tonight. Pt. States she just wants to wear her oxygen tonight.

## 2011-09-11 NOTE — Progress Notes (Signed)
Patient ID: [name], female   DOB: 06/13/1940, 71 y.o.   MRN: [id number]    SUBJECTIVE: Diuresing better since starting nesiritide gtt.  Creatinine is stable.  Breathing better.       Marland Kitchen albuterol  2.5 mg Nebulization QID   And  . ipratropium  0.5 mg Nebulization QID  . aspirin EC  81 mg Oral Daily  . atorvastatin  10 mg Oral q1800  . carvedilol  12.5 mg Oral BID WC  . furosemide  40 mg Intravenous Q8H  . heparin subcutaneous  5,000 Units Subcutaneous Q8H  . hydrALAZINE  50 mg Oral Q8H  . insulin aspart  0-15 Units Subcutaneous TID WC  . insulin aspart  0-5 Units Subcutaneous QHS  . isosorbide mononitrate  60 mg Oral Daily  . magnesium chloride  2 tablet Oral Daily  . magnesium sulfate  3 g Intravenous Once  . moxifloxacin  400 mg Oral Q2000  . nicotine  21 mg Transdermal Daily  . paricalcitol  1 mcg Oral Daily  . predniSONE  40 mg Oral Q breakfast  . sodium chloride  3 mL Intravenous Q12H  . DISCONTD: amLODipine  5 mg Oral Daily  . DISCONTD: furosemide  40 mg Intravenous Q12H  . DISCONTD: hydrALAZINE  25 mg Oral Q8H  . DISCONTD: hydrALAZINE  50 mg Oral Q8H  . DISCONTD: hydrALAZINE  75 mg Oral Q8H  . DISCONTD: isosorbide mononitrate  30 mg Oral Daily  . DISCONTD: predniSONE  50 mg Oral Q breakfast  nesiritide gtt at 0.01    Filed Vitals:   09/11/11 0700 09/11/11 0812 09/11/11 0815 09/11/11 0845  BP: 145/76  136/73   Pulse: 61  61   Temp:  98 F (36.7 C)    TempSrc:  Oral    Resp: 25  24   Height:      Weight:      SpO2: 97%  98% 95%    Intake/Output Summary (Last 24 hours) at 09/11/11 1034 Last data filed at 09/11/11 1000  Gross per 24 hour  Intake 1473.87 ml  Output   2601 ml  Net -1127.13 ml    LABS: Basic Metabolic Panel:  Basename 09/11/11 0535 09/10/11 0529 09/09/11 0837  NA 140 141 --  K 4.0 3.9 --  CL 101 102 --  CO2 31 32 --  GLUCOSE 102* 110* --  BUN 41* 35* --  CREATININE 1.96* 1.98* --  CALCIUM 8.3* 8.5 --  MG 1.3* -- 1.2*    PHOS -- -- --   Liver Function Tests:  Basename 09/11/11 0535  AST 11  ALT 6  ALKPHOS 44  BILITOT 0.3  PROT 5.1*  ALBUMIN 2.1*   No results found for this basename: LIPASE:2,AMYLASE:2 in the last 72 hours CBC:  Basename 09/11/11 0535 09/10/11 0529  WBC 7.2 7.6  NEUTROABS -- --  HGB 11.3* 11.3*  HCT 37.0 37.5  MCV 79.6 80.8  PLT 195 180   Cardiac Enzymes: No results found for this basename: CKTOTAL:3,CKMB:3,CKMBINDEX:3,TROPONINI:3 in the last 72 hours BNP: No components found with this basename: POCBNP:3 D-Dimer: No results found for this basename: DDIMER:2 in the last 72 hours Hemoglobin A1C: No results found for this basename: HGBA1C in the last 72 hours Fasting Lipid Panel: No results found for this basename: CHOL,HDL,LDLCALC,TRIG,CHOLHDL,LDLDIRECT in the last 72 hours Thyroid Function Tests: No results found for this basename: TSH,T4TOTAL,FREET3,T3FREE,THYROIDAB in the last 72 hours Anemia Panel: No results found for this basename: VITAMINB12,FOLATE,FERRITIN,TIBC,IRON,RETICCTPCT in the last  72 hours  RADIOLOGY: Dg Chest Port 1 View  09/10/2011  *RADIOLOGY REPORT*  Clinical Data: Respiratory distress.  PORTABLE CHEST - 1 VIEW  Comparison: Portable chest 09/09/2011.  Findings: The heart is enlarged.  Retrocardiac opacification is stable.  Pulmonary vascular congestion and bilateral pleural effusions remain.  Mild right basilar airspace disease is unchanged.  IMPRESSION:  1.  Stable appearance of retrocardiac opacification concerning for atelectasis or infection. 2.  Persistent bilateral pleural effusions. 3.  Stable cardiomegaly and pulmonary vascular congestion. 4.  Mild atelectasis versus infection at the right lung base is stable.  Original Report Authenticated By: Jamesetta Orleans. MATTERN, M.D.    PHYSICAL EXAM General: Chronically ill appearing.  HEENT: normal  Neck: supple. JVP 14 cm. Carotids 2+ bilat; no bruits. No lymphadenopathy or thryomegaly appreciated.   Cor: Distant. Regular rate & rhythm. No rubs, gallops. 2/6 systolic murmur LLSB.  Lungs: distant with crackles at bases.  Abdomen: soft, nontender, nondistended. No hepatosplenomegaly. No bruits or masses. Good bowel sounds.  Extremities: no cyanosis, clubbing, rash, 1-2+ edema to knee bilaterally.  Neuro: alert & orientedx3, cranial nerves grossly intact. moves all 4 extremities w/o difficulty. Affect pleasant  TELEMETRY: Reviewed telemetry pt in NSR  ASSESSMENT AND PLAN: 1. Acute on chronic respiratory failure: acute issue resolved. Continue O2  2. Acute systolic and diastolic heart failure, EF 20-25%: This may be an ischemic cardiomyopathy given septal wall motion abnormality on echo. She remains quite volume overloaded but is diuresing better with addition of nesiritide.  Renal fxn stable.  - Continue nesiritide and current Lasix dosing.  - I would aim towards RHC on Monday to assess filling pressures, PA pressure, and cardiac output.   - Would hold off on LHC for now with renal dysfunction though I am concerned for ischemic CMP.  - Continue current Coreg, hydralazine/Imdur.  No ACEI with renal dysfunction.  3. CAD: Suspect ischemic CMP with wall motion abnormality. She is on ASA and statin.  4. Acute on chronic renal failure, probably cardiorenal.  Stable creatinine.  5. Obesity hypoventilation syndrome  - sleep study will need to be scheduled  6. COPD with continuous O2  7. HTN: SBP in 130s this morning but was high overnight.  Will titrate up hydralazine if remains elevated.  8. Hypomagnesium  - replete  9. Pulmonary HTN: Severe by echo. Suspect mixed pulmonary arterial and pulmonary venous HTN => elevated left heart filling pressures to cause pulmonary venous HTN and OSA/OHS to cause pulmonary arterial HTN component. Will need RHC to confirm.   [name] 09/11/2011 10:39 AM

## 2011-09-11 NOTE — Progress Notes (Signed)
RT placed patient on cpap auto 8cmH20 with 4lpm 02 bleed in. Patient is tolerating cpap well. RT will continue to monitor.

## 2011-09-12 DIAGNOSIS — E669 Obesity, unspecified: Secondary | ICD-10-CM

## 2011-09-12 LAB — BASIC METABOLIC PANEL
CO2: 34 mEq/L — ABNORMAL HIGH (ref 19–32)
Calcium: 8.4 mg/dL (ref 8.4–10.5)
Creatinine, Ser: 2.22 mg/dL — ABNORMAL HIGH (ref 0.50–1.10)
GFR calc non Af Amer: 21 mL/min — ABNORMAL LOW (ref >90)
GFR calc non Af Amer: 21 mL/min — ABNORMAL LOW (ref >90)
Glucose, Bld: 96 mg/dL (ref 70–99)
Glucose, Bld: 171 mg/dL — ABNORMAL HIGH (ref 70–99)
Potassium: 3.7 mEq/L (ref 3.5–5.1)
Sodium: 138 mEq/L (ref 135–145)
Sodium: 136 mEq/L (ref 135–145)

## 2011-09-12 LAB — GLUCOSE, CAPILLARY

## 2011-09-12 LAB — PROTIME-INR
INR: 0.94 (ref 0.00–1.49)
Prothrombin Time: 12.8 seconds (ref 11.6–15.2)

## 2011-09-12 NOTE — Progress Notes (Signed)
Subjective: 71 y/o female admitted on 7/15 with worsening shortness of breath, she is on home oxygen with limited mobility due to underlying COPD. 2D echo in the hospital revealed EF 25-30%,  And grade 3 diastolic dysfunction. Patient was found to have elevated BNP and was started on IV lasix. Patient did not improve and CCM was consulted, she was transferred to ICU for acute on chronic hypercarbic respiratory failure. Patient was put on Bipap and started on po prednisone. Cardiology was consulted and is continued on iv lasix. 7/19: Patient still has mild dyspnea at rest. Foley catheter is out, urine culture is negative. 7/20: Patient was started on nesitiride infusion last night as per CHF team and transferred to step down unit. At this time foley catheter is inserted and lasix has been increased to 40 mg iv q 8 hr.Patient denies dyspnea , and is diuresing well. 7/21: Patient is diuresing well, creatinine now starting to go up with brisk diuresis. On Nesitiride drip, Heart failure team is following.  Objective: Vital signs in last 24 hours: Temp:  [97.7 F (36.5 C)-98.8 F (37.1 C)] 98.8 F (37.1 C) (07/21 0332) Pulse Rate:  [58-83] 61  (07/21 0332) Resp:  [21-30] 24  (07/21 0332) BP: (117-141)/(37-65) 127/63 mmHg (07/21 0332) SpO2:  [92 %-97 %] 95 % (07/21 0332) FiO2 (%):  [2 %] 2 % (07/20 1210) Weight:  [109 kg (240 lb 4.8 oz)] 109 kg (240 lb 4.8 oz) (07/21 0500) Weight change: 2.3 kg (5 lb 1.1 oz) Last BM Date: 09/10/11  Intake/Output from previous day: 07/20 0701 - 07/21 0700 In: 1403.7 [P.O.:840; I.V.:457.7; IV Piggyback:106] Out: 1800 [Urine:1800]     Physical Exam: HEENT: Atraumatic, normocephalic Neck: Supple Chest : Bibasilar crackles Heart : S1 S2 Regular, no murmurs Abdomen: Soft, Nontender, no organomegaly Ext : B/L 1 + edema in lower extremities   Lab Results: Basic Metabolic Panel:  Basename 09/12/11 0520 09/11/11 0535 09/09/11 0837  NA 138 140 --  K 3.7 4.0  --  CL 100 101 --  CO2 34* 31 --  GLUCOSE 96 102* --  BUN 51* 41* --  CREATININE 2.26* 1.96* --  CALCIUM 8.5 8.3* --  MG -- 1.3* 1.2*  PHOS -- -- --   Liver Function Tests:  Basename 09/11/11 0535  AST 11  ALT 6  ALKPHOS 44  BILITOT 0.3  PROT 5.1*  ALBUMIN 2.1*   No results found for this basename: LIPASE:2,AMYLASE:2 in the last 72 hours No results found for this basename: AMMONIA:2 in the last 72 hours CBC:  Basename 09/11/11 0535 09/10/11 0529  WBC 7.2 7.6  NEUTROABS -- --  HGB 11.3* 11.3*  HCT 37.0 37.5  MCV 79.6 80.8  PLT 195 180   Cardiac Enzymes: No results found for this basename: CKTOTAL:3,CKMB:3,CKMBINDEX:3,TROPONINI:3 in the last 72 hours BNP: No results found for this basename: PROBNP:3 in the last 72 hours D-Dimer: No results found for this basename: DDIMER:2 in the last 72 hours CBG:  Basename 09/11/11 2113 09/11/11 1552 09/11/11 1212 09/11/11 0811 09/10/11 2201 09/10/11 2120  GLUCAP 142* 148* 227* 135* 140* 147*   Urine Drug Screen: Drugs of Abuse  No results found for this basename: labopia,  cocainscrnur,  labbenz,  amphetmu,  thcu,  labbarb    Alcohol Level: No results found for this basename: ETH:2 in the last 72 hours Urinalysis:  Basename 09/09/11 1251  COLORURINE YELLOW  LABSPEC 1.013  PHURINE 5.0  GLUCOSEU NEGATIVE  HGBUR MODERATE*  BILIRUBINUR NEGATIVE  KETONESUR NEGATIVE  PROTEINUR 100*  UROBILINOGEN 0.2  NITRITE NEGATIVE  LEUKOCYTESUR LARGE*    Recent Results (from the past 240 hour(s))  URINE CULTURE     Status: Normal   Collection Time   09/06/11  8:46 AM      Component Value Range Status Comment   Specimen Description URINE, CATHETERIZED   Final    Special Requests NONE   Final    Culture  Setup Time 09/06/2011 09:16   Final    Colony Count NO GROWTH   Final    Culture NO GROWTH   Final    Report Status 09/07/2011 FINAL   Final   MRSA PCR SCREENING     Status: Normal   Collection Time   09/07/11  6:04 PM       Component Value Range Status Comment   MRSA by PCR NEGATIVE  NEGATIVE Final     Studies/Results: No results found.  Medications: Scheduled Meds:    . albuterol  2.5 mg Nebulization QID   And  . ipratropium  0.5 mg Nebulization QID  . aspirin EC  81 mg Oral Daily  . atorvastatin  10 mg Oral q1800  . carvedilol  12.5 mg Oral BID WC  . furosemide  40 mg Intravenous Q8H  . heparin subcutaneous  5,000 Units Subcutaneous Q8H  . hydrALAZINE  50 mg Oral Q8H  . insulin aspart  0-15 Units Subcutaneous TID WC  . insulin aspart  0-5 Units Subcutaneous QHS  . isosorbide mononitrate  60 mg Oral Daily  . magnesium chloride  2 tablet Oral Daily  . magnesium sulfate 1 - 4 g bolus IVPB  3 g Intravenous Once  . moxifloxacin  400 mg Oral Q2000  . nicotine  21 mg Transdermal Daily  . paricalcitol  1 mcg Oral Daily  . predniSONE  40 mg Oral Q breakfast  . sodium chloride  3 mL Intravenous Q12H  . DISCONTD: hydrALAZINE  50 mg Oral Q8H  . DISCONTD: hydrALAZINE  75 mg Oral Q8H  . DISCONTD: isosorbide mononitrate  30 mg Oral Daily  . DISCONTD: magnesium sulfate  3 g Intravenous Once  . DISCONTD: magnesium sulfate  3 g Intravenous Once  . DISCONTD: predniSONE  50 mg Oral Q breakfast   Continuous Infusions:    . sodium chloride    . nesiritide (NATRECOR) infusion 0.01 mcg/kg/min (09/12/11 0600)   PRN Meds:.acetaminophen, albuterol, ipratropium  Assessment/Plan:   Acute on chronic respiratory failure with hypercapnia Multifactorial, COPD, Diastolic dysfunction, systolic dysfunction Continue IV lasix Used CPAP at night Will taper off prednisone Continue Avelox Nebulizers prn   Type 2 diabetes mellitus with vascular disease Continue SSI BG well controlled Taper off steroids   Hypertensive heart disease with congestive heart failure Continue lasix, Consider reducing the dose as Cr is now increasing. Coreg, lipitor, ASA On nesitiride infusion as per heart failure team. Right heart  cath as per cardiology on Monday  Chronic kidney disease (CKD), stage III Cr is improving with diuresis ACE on hold Cont to monitor  Hypomagnesemia Magnesium replaced.  DVT prophylaxis Heparin    LOS: 6 days   [facility] Pager: [phone] 09/12/2011, 8:26 AM

## 2011-09-12 NOTE — Progress Notes (Signed)
Patient ID: [name], female   DOB: [birth year], 71 y.o.   MRN: [id number]     SUBJECTIVE: Diuresis has slowed, creatinine is up.  BP has been stable, running 120s-140s SBP.  Overall she thinks she is breathing better.      Marland Kitchen albuterol  2.5 mg Nebulization QID   And  . ipratropium  0.5 mg Nebulization QID  . aspirin EC  81 mg Oral Daily  . atorvastatin  10 mg Oral q1800  . carvedilol  12.5 mg Oral BID WC  . furosemide  40 mg Intravenous Q8H  . heparin subcutaneous  5,000 Units Subcutaneous Q8H  . hydrALAZINE  50 mg Oral Q8H  . insulin aspart  0-15 Units Subcutaneous TID WC  . insulin aspart  0-5 Units Subcutaneous QHS  . isosorbide mononitrate  60 mg Oral Daily  . magnesium chloride  2 tablet Oral Daily  . magnesium sulfate 1 - 4 g bolus IVPB  3 g Intravenous Once  . moxifloxacin  400 mg Oral Q2000  . nicotine  21 mg Transdermal Daily  . paricalcitol  1 mcg Oral Daily  . predniSONE  40 mg Oral Q breakfast  . sodium chloride  3 mL Intravenous Q12H  . DISCONTD: magnesium sulfate  3 g Intravenous Once  . DISCONTD: magnesium sulfate  3 g Intravenous Once  nesiritide gtt at 0.01    Filed Vitals:   09/12/11 0332 09/12/11 0500 09/12/11 0834 09/12/11 0835  BP: 127/63   129/59  Pulse: 61   60  Temp: 98.8 F (37.1 C)  97.8 F (36.6 C)   TempSrc: Axillary  Oral   Resp: 24   18  Height:      Weight:  240 lb 4.8 oz (109 kg)    SpO2: 95%  95% 97%    Intake/Output Summary (Last 24 hours) at 09/12/11 1051 Last data filed at 09/12/11 0600  Gross per 24 hour  Intake    744 ml  Output   1800 ml  Net  -1056 ml    LABS: Basic Metabolic Panel:  Basename 09/12/11 0520 09/11/11 0535  NA 138 140  K 3.7 4.0  CL 100 101  CO2 34* 31  GLUCOSE 96 102*  BUN 51* 41*  CREATININE 2.26* 1.96*  CALCIUM 8.5 8.3*  MG -- 1.3*  PHOS -- --   Liver Function Tests:  Basename 09/11/11 0535  AST 11  ALT 6  ALKPHOS 44  BILITOT 0.3  PROT 5.1*  ALBUMIN 2.1*   No results found  for this basename: LIPASE:2,AMYLASE:2 in the last 72 hours CBC:  Basename 09/11/11 0535 09/10/11 0529  WBC 7.2 7.6  NEUTROABS -- --  HGB 11.3* 11.3*  HCT 37.0 37.5  MCV 79.6 80.8  PLT 195 180   Cardiac Enzymes: No results found for this basename: CKTOTAL:3,CKMB:3,CKMBINDEX:3,TROPONINI:3 in the last 72 hours BNP: No components found with this basename: POCBNP:3 D-Dimer: No results found for this basename: DDIMER:2 in the last 72 hours Hemoglobin A1C: No results found for this basename: HGBA1C in the last 72 hours Fasting Lipid Panel: No results found for this basename: CHOL,HDL,LDLCALC,TRIG,CHOLHDL,LDLDIRECT in the last 72 hours Thyroid Function Tests: No results found for this basename: TSH,T4TOTAL,FREET3,T3FREE,THYROIDAB in the last 72 hours Anemia Panel: No results found for this basename: VITAMINB12,FOLATE,FERRITIN,TIBC,IRON,RETICCTPCT in the last 72 hours  RADIOLOGY: Dg Chest Port 1 View  09/10/2011  *RADIOLOGY REPORT*  Clinical Data: Respiratory distress.  PORTABLE CHEST - 1 VIEW  Comparison: Portable chest 09/09/2011.  Findings: The heart is enlarged.  Retrocardiac opacification is stable.  Pulmonary vascular congestion and bilateral pleural effusions remain.  Mild right basilar airspace disease is unchanged.  IMPRESSION:  1.  Stable appearance of retrocardiac opacification concerning for atelectasis or infection. 2.  Persistent bilateral pleural effusions. 3.  Stable cardiomegaly and pulmonary vascular congestion. 4.  Mild atelectasis versus infection at the right lung base is stable.  Original Report Authenticated By: Jamesetta Orleans. MATTERN, M.D.    PHYSICAL EXAM General: Chronically ill appearing.  HEENT: normal  Neck: supple. JVP 14 cm. Carotids 2+ bilat; no bruits. No lymphadenopathy or thryomegaly appreciated.  Cor: Distant. Regular rate & rhythm. No rubs, gallops. 2/6 systolic murmur LLSB.  Lungs: distant with crackles at bases.  Abdomen: soft, nontender,  nondistended. No hepatosplenomegaly. No bruits or masses. Good bowel sounds.  Extremities: no cyanosis, clubbing, rash, 1-2+ edema to knee bilaterally.  Neuro: alert & orientedx3, cranial nerves grossly intact. moves all 4 extremities w/o difficulty. Affect pleasant  TELEMETRY: Reviewed telemetry pt in NSR  ASSESSMENT AND PLAN: 1. Acute on chronic respiratory failure: acute issue resolved. Continue O2  2. Acute systolic and diastolic heart failure, EF 20-25%: This may be an ischemic cardiomyopathy given septal wall motion abnormality on echo. Renal fxn has worsened and diuresis has slowed.  She still looks very volume overloaded on exam.  - Continue nesiritide and current Lasix dosing for today.  I do not want to push her diuretics until I know what her hemodynamics look like given worsening renal function.  It is possible that her PCWP is low now with primarily right heart failure given pulmonary HTN.  In that situation, milrinone may be a better choice here. Will plan RHC tomorrow to assess filling pressures, PA pressure, and cardiac output.   - Would hold off on LHC for now with renal dysfunction though I am concerned for ischemic CMP.  - Continue current Coreg, hydralazine/Imdur.  No ACEI with renal dysfunction.  3. CAD: Suspect ischemic CMP with wall motion abnormality. She is on ASA and statin.  4. Acute on chronic renal failure, probably cardiorenal.  BUN/creatinine worsened this morning though she still looks volume overloaded => ? Worsening of renal fxn in setting of right > left heart failure with significant pulmonary HTN.  5. Obesity hypoventilation syndrome  - sleep study will need to be scheduled  6. COPD with continuous O2  7. HTN: SBP 120s-140s. 10. Pulmonary HTN: Severe by echo. Suspect mixed pulmonary arterial and pulmonary venous HTN => elevated left heart filling pressures to cause pulmonary venous HTN and OSA/OHS to cause pulmonary arterial HTN component. Will need RHC to  confirm.  My concern now with rising BUN/creatinine is that she may have right heart failure out of proportion to left heart failure with PCWP not particularly elevated and milrinone may be a better agent to use here (for right heart support).  Therefore, RHC tomorrow for filling pressures/CO.  I will not push diuretics harder until we know her true hemodynamics.   [name] 09/12/2011 10:51 AM

## 2011-09-12 NOTE — Progress Notes (Signed)
Pt. Decided to go on cpap. Pt. Is on auto titrate and tolerating well at this time.

## 2011-09-13 ENCOUNTER — Encounter (HOSPITAL_COMMUNITY)
Admission: EM | Disposition: A | Discharge status: Skilled Nursing Facility | Payer: Self-pay | Source: Home / Self Care | Attending: Internal Medicine

## 2011-09-13 ENCOUNTER — Encounter (HOSPITAL_COMMUNITY): Payer: Self-pay

## 2011-09-13 DIAGNOSIS — N179 Acute kidney failure, unspecified: Secondary | ICD-10-CM

## 2011-09-13 DIAGNOSIS — I2789 Other specified pulmonary heart diseases: Secondary | ICD-10-CM

## 2011-09-13 DIAGNOSIS — I5021 Acute systolic (congestive) heart failure: Secondary | ICD-10-CM

## 2011-09-13 DIAGNOSIS — I509 Heart failure, unspecified: Secondary | ICD-10-CM

## 2011-09-13 DIAGNOSIS — N189 Chronic kidney disease, unspecified: Secondary | ICD-10-CM

## 2011-09-13 DIAGNOSIS — R0989 Other specified symptoms and signs involving the circulatory and respiratory systems: Secondary | ICD-10-CM

## 2011-09-13 DIAGNOSIS — R0609 Other forms of dyspnea: Secondary | ICD-10-CM

## 2011-09-13 DIAGNOSIS — J9600 Acute respiratory failure, unspecified whether with hypoxia or hypercapnia: Secondary | ICD-10-CM

## 2011-09-13 HISTORY — PX: RIGHT HEART CATHETERIZATION: SHX5447

## 2011-09-13 LAB — COMPREHENSIVE METABOLIC PANEL
AST: 19 U/L (ref 0–37)
Albumin: 1.9 g/dL — ABNORMAL LOW (ref 3.5–5.2)
Alkaline Phosphatase: 41 U/L (ref 39–117)
CO2: 28 mEq/L (ref 19–32)
Chloride: 98 mEq/L (ref 96–112)
Creatinine, Ser: 2.36 mg/dL — ABNORMAL HIGH (ref 0.50–1.10)
GFR calc non Af Amer: 20 mL/min — ABNORMAL LOW (ref >90)
Potassium: 3.7 mEq/L (ref 3.5–5.1)
Total Bilirubin: 0.2 mg/dL — ABNORMAL LOW (ref 0.3–1.2)

## 2011-09-13 LAB — POCT I-STAT 3, VENOUS BLOOD GAS (G3P V)
Acid-Base Excess: 5 mmol/L — ABNORMAL HIGH (ref 0.0–2.0)
Bicarbonate: 32.3 mEq/L — ABNORMAL HIGH (ref 20.0–24.0)
O2 Saturation: 52 %
TCO2: 34 mmol/L (ref 0–100)
pH, Ven: 7.343 — ABNORMAL HIGH (ref 7.250–7.300)

## 2011-09-13 LAB — CBC
MCH: 24.6 pg — ABNORMAL LOW (ref 26.0–34.0)
MCH: 24.8 pg — ABNORMAL LOW (ref 26.0–34.0)
MCHC: 30.2 g/dL (ref 30.0–36.0)
MCHC: 30.3 g/dL (ref 30.0–36.0)
MCV: 81.7 fL (ref 78.0–100.0)
MCV: 81.8 fL (ref 78.0–100.0)
Platelets: 192 10*3/uL (ref 150–400)
Platelets: 177 10*3/uL (ref 150–400)
RDW: 18.3 % — ABNORMAL HIGH (ref 11.5–15.5)
RDW: 18.1 % — ABNORMAL HIGH (ref 11.5–15.5)

## 2011-09-13 LAB — GLUCOSE, CAPILLARY
Glucose-Capillary: 138 mg/dL — ABNORMAL HIGH (ref 70–99)
Glucose-Capillary: 183 mg/dL — ABNORMAL HIGH (ref 70–99)
Glucose-Capillary: 101 mg/dL — ABNORMAL HIGH (ref 70–99)

## 2011-09-13 SURGERY — RIGHT HEART CATH
Anesthesia: Moderate Sedation

## 2011-09-13 MED ORDER — TORSEMIDE 20 MG PO TABS
20.0000 mg | ORAL_TABLET | Freq: Two times a day (BID) | ORAL | Status: DC
  Administered 2011-09-13 (×2): 20 mg via ORAL
  Filled 2011-09-13 (×5): qty 1

## 2011-09-13 MED ORDER — FENTANYL CITRATE 0.05 MG/ML IJ SOLN
INTRAMUSCULAR | Status: AC
  Filled 2011-09-13: qty 2

## 2011-09-13 MED ORDER — SODIUM CHLORIDE 0.9 % IJ SOLN
3.0000 mL | Freq: Two times a day (BID) | INTRAMUSCULAR | Status: DC
  Administered 2011-09-13 – 2011-09-14 (×2): 3 mL via INTRAVENOUS

## 2011-09-13 MED ORDER — NITROGLYCERIN 0.2 MG/ML ON CALL CATH LAB
INTRAVENOUS | Status: AC
  Filled 2011-09-13: qty 1

## 2011-09-13 MED ORDER — HEPARIN (PORCINE) IN NACL 2-0.9 UNIT/ML-% IJ SOLN
INTRAMUSCULAR | Status: AC
  Filled 2011-09-13: qty 2000

## 2011-09-13 MED ORDER — PREDNISONE 20 MG PO TABS
20.0000 mg | ORAL_TABLET | Freq: Every day | ORAL | Status: DC
  Administered 2011-09-16: 20 mg via ORAL
  Filled 2011-09-13 (×2): qty 1

## 2011-09-13 MED ORDER — ASPIRIN 325 MG PO TABS
ORAL_TABLET | ORAL | Status: AC
  Administered 2011-09-13: 325 mg
  Filled 2011-09-13: qty 1

## 2011-09-13 MED ORDER — MIDAZOLAM HCL 2 MG/2ML IJ SOLN
INTRAMUSCULAR | Status: AC
  Filled 2011-09-13: qty 2

## 2011-09-13 MED ORDER — SODIUM CHLORIDE 0.9 % IJ SOLN: 3.0000 mL | INTRAMUSCULAR | Status: DC | PRN

## 2011-09-13 MED ORDER — LIDOCAINE HCL (PF) 1 % IJ SOLN
INTRAMUSCULAR | Status: AC
  Filled 2011-09-13: qty 30

## 2011-09-13 MED ORDER — SODIUM CHLORIDE 0.9 % IV SOLN: 250.0000 mL | INTRAVENOUS | Status: DC | PRN

## 2011-09-13 MED ORDER — PREDNISONE 10 MG PO TABS: 10.0000 mg | ORAL_TABLET | Freq: Every day | ORAL | Status: DC

## 2011-09-13 MED ORDER — ONDANSETRON HCL 4 MG/2ML IJ SOLN: 4.0000 mg | Freq: Four times a day (QID) | INTRAMUSCULAR | Status: DC | PRN

## 2011-09-13 MED ORDER — ACETAMINOPHEN 325 MG PO TABS
650.0000 mg | ORAL_TABLET | ORAL | Status: DC | PRN
  Administered 2011-09-16: 650 mg via ORAL
  Filled 2011-09-13: qty 2

## 2011-09-13 MED ORDER — PREDNISONE 20 MG PO TABS
30.0000 mg | ORAL_TABLET | Freq: Every day | ORAL | Status: AC
  Administered 2011-09-14 – 2011-09-15 (×2): 30 mg via ORAL
  Filled 2011-09-13 (×2): qty 1

## 2011-09-13 MED ORDER — ASPIRIN 81 MG PO CHEW: 324.0000 mg | CHEWABLE_TABLET | ORAL | Status: DC

## 2011-09-13 MED ORDER — PREDNISONE 5 MG PO TABS: 5.0000 mg | ORAL_TABLET | Freq: Every day | ORAL | Status: DC

## 2011-09-13 NOTE — Progress Notes (Signed)
[facility] Progress Note [place] TEAM 1 - Stepdown/ICU TEAM   [name] WUJ:[id number] DOB: 1940/06/09 DOA: 09/06/2011 PCP: Gwynneth Aliment, MD  Brief narrative: 71 y/o female admitted on 7/15 with worsening shortness of breath, she is on home oxygen with limited mobility due to underlying COPD. 2D echo in the hospital revealed EF 25-30%, and grade 3 diastolic dysfunction. Patient was found to have elevated BNP and was started on IV lasix. Patient did not improve and CCM was consulted, she was transferred to ICU for acute on chronic hypercarbic respiratory failure. Patient was put on Bipap and started on po prednisone. Cardiology was consulted and continued iv lasix.  7/19: Patient still has mild dyspnea at rest. Foley catheter is out, urine culture is negative.  7/20: Patient was started on nesitiride infusion last night as per CHF team and transferred to step down unit. At this time foley catheter is inserted and lasix has been increased to 40 mg iv q 8 hr.Patient denies dyspnea , and is diuresing well.  7/21: Patient is diuresing well, creatinine now starting to go up with brisk diuresis. On Nesitiride drip, Heart failure team is following.  Assessment/Plan:  Acute on chronic respiratory failure with hypercapnia  Multifactorial:  (COPD, Diastolic dysfunction, systolic dysfunction, PAH) - see discussion of each below  OSA/OHS non-compliant with CPAP Explained to patient the absolute need for nightly compliance with CPAP use  Tobacco use, ongoing Explained to patient the absolute need for complete and total abstinence from tobacco products  COPD with home O2 Felt to be quite severe - patient was still smoking up until the time of this hospital stay - no active wheeze on exam today  Pulmonary HTN, severe by echo Felt to be end-stage per Cards - for R heart cath today for more reliable quantification  HTN Reasonably controlled at the present time  Type 2 diabetes  mellitus with vascular disease  Continue SSI - reasonably controlled at the present time  Acute systolic and diastolic heart failure, EF 20-25% As per heart failure team  Chronic kidney disease (CKD), stage III Creatinine is holding stable at approximately 2.3  Hypomagnesemia Recheck in a.m.  Code Status: Full Consultants: Heart Failure Team PCCM  HPI/Subjective: The patient is resting comfortably in bed this time.  She denies chest pain fevers chills nausea or vomiting.  She denies dyspnea while at rest.   Objective: Blood pressure 136/62, pulse 57, temperature 98.4 F (36.9 C), temperature source Oral, resp. rate 25, height 5\' 4"  (1.626 m), weight 109 kg (240 lb 4.8 oz), SpO2 95.00%.  Intake/Output Summary (Last 24 hours) at 09/13/11 1048 Last data filed at 09/13/11 1000  Gross per 24 hour  Intake  687.8 ml  Output   2526 ml  Net -1838.2 ml     Exam: General: No acute respiratory distress at rest Lungs: Very distant breath sounds throughout all fields with no active wheezing at the present time Cardiovascular: Very distant heart sounds, regular rate and rhythm Abdomen: Obese soft bowel sounds present no organomegaly no rebound no ascites Extremities: 1+ bilateral lower extremity edema  Data Reviewed: Basic Metabolic Panel:  Lab 09/13/11 9562 09/12/11 1846 09/12/11 0520 09/11/11 0535 09/10/11 0529 09/09/11 0837 09/07/11 1932  NA 137 136 138 140 141 -- --  K 3.7 3.8 3.7 4.0 3.9 -- --  CL 98 98 100 101 102 -- --  CO2 28 31 34* 31 32 -- --  GLUCOSE 127* 171* 96 102* 110* -- --  BUN 56* 52* 51* 41* 35* -- --  CREATININE 2.36* 2.22* 2.26* 1.96* 1.98* -- --  CALCIUM 8.5 8.4 8.5 8.3* 8.5 -- --  MG -- -- -- 1.3* -- 1.2* 1.3*  PHOS -- -- -- -- -- -- --   Liver Function Tests:  Lab 09/13/11 0502 09/11/11 0535 09/07/11 1932 09/06/11 1258  AST 19 11 13 14   ALT 19 6 9 10   ALKPHOS 41 44 58 65  BILITOT 0.2* 0.3 0.4 0.4  PROT 4.7* 5.1* 5.4* 6.0  ALBUMIN 1.9* 2.1* 2.1*  2.5*   CBC:  Lab 09/13/11 0938 09/13/11 0502 09/11/11 0535 09/10/11 0529 09/09/11 0837  WBC 7.9 8.5 7.2 7.6 6.7  NEUTROABS -- -- -- -- --  HGB 12.4 12.0 11.3* 11.3* 11.6*  HCT 40.9 39.8 37.0 37.5 38.9  MCV 81.8 81.7 79.6 80.8 82.6  PLT 177 192 195 180 166   CBG:  Lab 09/13/11 0736 09/12/11 1655 09/12/11 1203 09/12/11 0836 09/11/11 2113  GLUCAP 104* 138* 106* 82 142*    Recent Results (from the past 240 hour(s))  URINE CULTURE     Status: Normal   Collection Time   09/06/11  8:46 AM      Component Value Range Status Comment   Specimen Description URINE, CATHETERIZED   Final    Special Requests NONE   Final    Culture  Setup Time 09/06/2011 09:16   Final    Colony Count NO GROWTH   Final    Culture NO GROWTH   Final    Report Status 09/07/2011 FINAL   Final   MRSA PCR SCREENING     Status: Normal   Collection Time   09/07/11  6:04 PM      Component Value Range Status Comment   MRSA by PCR NEGATIVE  NEGATIVE Final      Studies:  Recent x-ray studies have been reviewed in detail by the Attending Physician  Scheduled Meds:  Reviewed in detail by the Attending Physician   Lonia Blood, MD [facility] Office  [phone] Pager [phone]  On-Call/Text Page:      [email or web address]      password [email or web address]  If 7PM-7AM, please contact night-coverage [email or web address] Password [email or web address] 09/13/2011, 10:48 AM   LOS: 7 days

## 2011-09-13 NOTE — Progress Notes (Cosign Needed)
OT/ PT Cancellation Note  Treatment cancelled today due to patient's refusal to participate: pt educated on x3 refusals of therapy can equal discharge from therapy services. Pt states "there will be other therapist that will see me." Pt again educated that OT/ PT are attempting to see patient due to doctor orders and to address ADLs. Pt continues to adamantly refuse treatment at this time. Pt currently awaiting cath lab procedure.  OT to reattempt at later date/time. Pt currently with x2 refusals. OT attempting various therapist to help motivate pt to participate without success.  [name]   OTR/L Pager: [phone] Office: [phone] .

## 2011-09-13 NOTE — Clinical Social Work Placement (Signed)
     Clinical Social Work Department CLINICAL SOCIAL WORK PLACEMENT NOTE 09/13/2011  Patient:  [name], [name]  Account Number:  [id number] Admit date:  09/06/2011  Clinical Social Worker:  Doree Albee  Date/time:  09/07/2011 04:08 PM  Clinical Social Work is seeking post-discharge placement for this patient at the following level of care:   SKILLED NURSING   (*CSW will update this form in Epic as items are completed)   09/07/2011  Patient/family provided with [facility] Department of Clinical Social Works list of facilities offering this level of care within the geographic area requested by the patient (or if unable, by the patients family).  09/07/2011  Patient/family informed of their freedom to choose among providers that offer the needed level of care, that participate in Medicare, Medicaid or managed care program needed by the patient, have an available bed and are willing to accept the patient.  09/07/2011  Patient/family informed of [facility] ownership interest in [facility], as well as of the fact that they are under no obligation to receive care at this facility.  PASARR submitted to EDS on 09/07/2011 PASARR number received from EDS on 09/07/2011  FL2 transmitted to all facilities in geographic area requested by pt/family on  09/07/2011 FL2 transmitted to all facilities within larger geographic area on   Patient informed that his/her managed care company has contracts with or will negotiate with  certain facilities, including the following:     Patient/family informed of bed offers received:   Patient chooses bed at  Physician recommends and patient chooses bed at    Patient to be transferred to  on   Patient to be transferred to facility by   The following physician request were entered in Epic:   Additional Comments:

## 2011-09-13 NOTE — Progress Notes (Signed)
Patient ID: [name], female   DOB: 10/03/1940, 70 y.o.   MRN: [id number]     SUBJECTIVE:  Nesiritide started yesterday. Diuresis continues to be sluggish. Weight reportedly up 5 pounds despite I/O - 1L. SBP in 120s.  Gets from bed to chair with dyspnea. No dyspnea at rest. No dizziness. Refusing PT.      . albuterol  2.5 mg Nebulization QID   And  . ipratropium  0.5 mg Nebulization QID  . aspirin EC  81 mg Oral Daily  . atorvastatin  10 mg Oral q1800  . carvedilol  12.5 mg Oral BID WC  . furosemide  40 mg Intravenous Q8H  . heparin subcutaneous  5,000 Units Subcutaneous Q8H  . hydrALAZINE  50 mg Oral Q8H  . insulin aspart  0-15 Units Subcutaneous TID WC  . insulin aspart  0-5 Units Subcutaneous QHS  . isosorbide mononitrate  60 mg Oral Daily  . magnesium chloride  2 tablet Oral Daily  . moxifloxacin  400 mg Oral Q2000  . nicotine  21 mg Transdermal Daily  . paricalcitol  1 mcg Oral Daily  . predniSONE  40 mg Oral Q breakfast  . sodium chloride  3 mL Intravenous Q12H  nesiritide gtt at 0.01    Filed Vitals:   09/13/11 0030 09/13/11 0340 09/13/11 0733 09/13/11 0802  BP: 126/64 136/62    Pulse: 58 56    Temp: 98.4 F (36.9 C) 98.3 F (36.8 C) 98.4 F (36.9 C)   TempSrc: Oral Axillary Oral   Resp: 19 19    Height:      Weight:      SpO2: 96% 92%  95%    Intake/Output Summary (Last 24 hours) at 09/13/11 0817 Last data filed at 09/13/11 0700  Gross per 24 hour  Intake  787.9 ml  Output   2126 ml  Net -1338.1 ml    LABS: Basic Metabolic Panel:  Basename 09/13/11 0502 09/12/11 1846 09/11/11 0535  NA 137 136 --  K 3.7 3.8 --  CL 98 98 --  CO2 28 31 --  GLUCOSE 127* 171* --  BUN 56* 52* --  CREATININE 2.36* 2.22* --  CALCIUM 8.5 8.4 --  MG -- -- 1.3*  PHOS -- -- --   Liver Function Tests:  Basename 09/13/11 0502 09/11/11 0535  AST 19 11  ALT 19 6  ALKPHOS 41 44  BILITOT 0.2* 0.3  PROT 4.7* 5.1*  ALBUMIN 1.9* 2.1*   No results found  for this basename: LIPASE:2,AMYLASE:2 in the last 72 hours CBC:  Basename 09/13/11 0502 09/11/11 0535  WBC 8.5 7.2  NEUTROABS -- --  HGB 12.0 11.3*  HCT 39.8 37.0  MCV 81.7 79.6  PLT 192 195   Cardiac Enzymes: No results found for this basename: CKTOTAL:3,CKMB:3,CKMBINDEX:3,TROPONINI:3 in the last 72 hours BNP: No components found with this basename: POCBNP:3 D-Dimer: No results found for this basename: DDIMER:2 in the last 72 hours Hemoglobin A1C: No results found for this basename: HGBA1C in the last 72 hours Fasting Lipid Panel: No results found for this basename: CHOL,HDL,LDLCALC,TRIG,CHOLHDL,LDLDIRECT in the last 72 hours Thyroid Function Tests: No results found for this basename: TSH,T4TOTAL,FREET3,T3FREE,THYROIDAB in the last 72 hours Anemia Panel: No results found for this basename: VITAMINB12,FOLATE,FERRITIN,TIBC,IRON,RETICCTPCT in the last 72 hours  RADIOLOGY: Dg Chest Port 1 View  09/10/2011  *RADIOLOGY REPORT*  Clinical Data: Respiratory distress.  PORTABLE CHEST - 1 VIEW  Comparison: Portable chest 09/09/2011.  Findings: The heart is enlarged.    Retrocardiac opacification is stable.  Pulmonary vascular congestion and bilateral pleural effusions remain.  Mild right basilar airspace disease is unchanged.  IMPRESSION:  1.  Stable appearance of retrocardiac opacification concerning for atelectasis or infection. 2.  Persistent bilateral pleural effusions. 3.  Stable cardiomegaly and pulmonary vascular congestion. 4.  Mild atelectasis versus infection at the right lung base is stable.  Original Report Authenticated By: CHRISTOPHER W. MATTERN, M.D.    PHYSICAL EXAM General: Chronically ill appearing. obese HEENT: normal  Neck: supple. JVP hard to assess. Appears up. Carotids 2+ bilat; no bruits. No lymphadenopathy or thryomegaly appreciated.  Cor: Distant. Regular rate & rhythm. No rubs, gallops. 2/6 systolic murmur LLSB. No RV lift Lungs: distant with crackles at bases.    Abdomen: obese. soft, nontender, nondistended. No hepatosplenomegaly. No bruits or masses. Good bowel sounds.  Extremities: no cyanosis, clubbing, rash, tr edema  Neuro: alert & orientedx3, cranial nerves grossly intact. moves all 4 extremities w/o difficulty. Affect pleasant  TELEMETRY: Reviewed telemetry pt in NSR with PACs  ASSESSMENT 1. Acute on chronic respiratory failure 2. OSA non-compliant with CPAP 3. Tobacco use, ongoing  4. Hypercarbic respiratory failure 5. COPD with home O2 6. Acute systolic and diastolic heart failure, EF 20-25%: This may be an ischemic cardiomyopathy given septal wall motion abnormality on echo. 7. A/c on renal failure 8. Pulmonary HTN, severe by echo        -- severe LA dilation on echo. With RV dysfunction 9. Obesity hypoventilation syndrome  - sleep study will need to be scheduled  10. COPD with continuous O2  11. HTN: SBP 120s-140s.    PLAN/DISCUSSION:  Very difficult situation. 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 I agree with Dr. Mclean that this is likely due to a combination of both pulmonary arterial and pulmonary venous HTN in the setting of noncompliance with CPAP, OHS, COPD with ongoing tobacco use and severe diastolic dysfunction. Will stop nesiritide. Agree with RHC today to further evaluate hemodynamics. Will need PFTs with DLCO as well as sleep study. Not sure that we can help her much at this point but needs aggressive treatment of OSA, weight loss and smoking cessation. Depending on results of RHC may warrant tune-up with milrinone and a trial of sildenafil but doubt this will help much in the long term.   Agree that she likely also has iCM but would not do coronary angio at this point given worsening renal function.   Allyana Vogan, MD 09/13/2011 8:17 AM   

## 2011-09-13 NOTE — H&P (View-Only) (Signed)
Patient ID: [name], female   DOB: [birth year], 71 y.o.   MRN: [id number]     SUBJECTIVE:  [name] started yesterday. Diuresis continues to be sluggish. Weight reportedly up 5 pounds despite I/O - 1L. SBP in 120s.  Gets from bed to chair with dyspnea. No dyspnea at rest. No dizziness. Refusing PT.      Marland Kitchen albuterol  2.5 mg Nebulization QID   And  . ipratropium  0.5 mg Nebulization QID  . aspirin EC  81 mg Oral Daily  . atorvastatin  10 mg Oral q1800  . carvedilol  12.5 mg Oral BID WC  . furosemide  40 mg Intravenous Q8H  . heparin subcutaneous  5,000 Units Subcutaneous Q8H  . hydrALAZINE  50 mg Oral Q8H  . insulin aspart  0-15 Units Subcutaneous TID WC  . insulin aspart  0-5 Units Subcutaneous QHS  . isosorbide mononitrate  60 mg Oral Daily  . magnesium chloride  2 tablet Oral Daily  . moxifloxacin  400 mg Oral Q2000  . nicotine  21 mg Transdermal Daily  . paricalcitol  1 mcg Oral Daily  . predniSONE  40 mg Oral Q breakfast  . sodium chloride  3 mL Intravenous Q12H  [name] gtt at 0.01    Filed Vitals:   09/13/11 0030 09/13/11 0340 09/13/11 0733 09/13/11 0802  BP: 126/64 136/62    Pulse: 58 56    Temp: 98.4 F (36.9 C) 98.3 F (36.8 C) 98.4 F (36.9 C)   TempSrc: Oral Axillary Oral   Resp: 19 19    Height:      Weight:      SpO2: 96% 92%  95%    Intake/Output Summary (Last 24 hours) at 09/13/11 0817 Last data filed at 09/13/11 0700  Gross per 24 hour  Intake  787.9 ml  Output   2126 ml  Net -1338.1 ml    LABS: Basic Metabolic Panel:  Basename 09/13/11 0502 09/12/11 1846 09/11/11 0535  NA 137 136 --  K 3.7 3.8 --  CL 98 98 --  CO2 28 31 --  GLUCOSE 127* 171* --  BUN 56* 52* --  CREATININE 2.36* 2.22* --  CALCIUM 8.5 8.4 --  MG -- -- 1.3*  PHOS -- -- --   Liver Function Tests:  Basename 09/13/11 0502 09/11/11 0535  AST 19 11  ALT 19 6  ALKPHOS 41 44  BILITOT 0.2* 0.3  PROT 4.7* 5.1*  ALBUMIN 1.9* 2.1*   No results found  for this basename: LIPASE:2,AMYLASE:2 in the last 72 hours CBC:  Basename 09/13/11 0502 09/11/11 0535  WBC 8.5 7.2  NEUTROABS -- --  HGB 12.0 11.3*  HCT 39.8 37.0  MCV 81.7 79.6  PLT 192 195   Cardiac Enzymes: No results found for this basename: CKTOTAL:3,CKMB:3,CKMBINDEX:3,TROPONINI:3 in the last 72 hours BNP: No components found with this basename: POCBNP:3 D-Dimer: No results found for this basename: DDIMER:2 in the last 72 hours Hemoglobin A1C: No results found for this basename: HGBA1C in the last 72 hours Fasting Lipid Panel: No results found for this basename: CHOL,HDL,LDLCALC,TRIG,CHOLHDL,LDLDIRECT in the last 72 hours Thyroid Function Tests: No results found for this basename: TSH,T4TOTAL,FREET3,T3FREE,THYROIDAB in the last 72 hours Anemia Panel: No results found for this basename: VITAMINB12,FOLATE,FERRITIN,TIBC,IRON,RETICCTPCT in the last 72 hours  RADIOLOGY: Dg Chest Port 1 View  09/10/2011  *RADIOLOGY REPORT*  Clinical Data: Respiratory distress.  PORTABLE CHEST - 1 VIEW  Comparison: Portable chest 09/09/2011.  Findings: The heart is enlarged.  Retrocardiac opacification is stable.  Pulmonary vascular congestion and bilateral pleural effusions remain.  Mild right basilar airspace disease is unchanged.  IMPRESSION:  1.  Stable appearance of retrocardiac opacification concerning for atelectasis or infection. 2.  Persistent bilateral pleural effusions. 3.  Stable cardiomegaly and pulmonary vascular congestion. 4.  Mild atelectasis versus infection at the right lung base is stable.  Original Report Authenticated By: [name], M.D.    PHYSICAL EXAM General: Chronically ill appearing. obese HEENT: normal  Neck: supple. JVP hard to assess. Appears up. Carotids 2+ bilat; no bruits. No lymphadenopathy or thryomegaly appreciated.  Cor: Distant. Regular rate & rhythm. No rubs, gallops. 2/6 systolic murmur LLSB. No RV lift Lungs: distant with crackles at bases.    Abdomen: obese. soft, nontender, nondistended. No hepatosplenomegaly. No bruits or masses. Good bowel sounds.  Extremities: no cyanosis, clubbing, rash, tr edema  Neuro: alert & orientedx3, cranial nerves grossly intact. moves all 4 extremities w/o difficulty. Affect pleasant  TELEMETRY: Reviewed telemetry pt in NSR with PACs  ASSESSMENT 1. Acute on chronic respiratory failure 2. OSA non-compliant with CPAP 3. Tobacco use, ongoing  4. Hypercarbic respiratory failure 5. COPD with home O2 6. Acute systolic and diastolic heart failure, EF 20-25%: This may be an ischemic cardiomyopathy given septal wall motion abnormality on echo. 7. A/c on renal failure 8. Pulmonary HTN, severe by echo        -- severe LA dilation on echo. With RV dysfunction 9. Obesity hypoventilation syndrome  - sleep study will need to be scheduled  10. COPD with continuous O2  11. HTN: SBP 120s-140s.    PLAN/DISCUSSION:  Very difficult situation. 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 I agree with [name] that this is likely due to a combination of both pulmonary arterial and pulmonary venous HTN in the setting of noncompliance with CPAP, OHS, COPD with ongoing tobacco use and severe diastolic dysfunction. Will stop [name]. Agree with RHC today to further evaluate hemodynamics. Will need PFTs with DLCO as well as sleep study. Not sure that we can help her much at this point but needs aggressive treatment of OSA, weight loss and smoking cessation. Depending on results of RHC may warrant tune-up with milrinone and a trial of sildenafil but doubt this will help much in the long term.   Agree that she likely also has iCM but would not do coronary angio at this point given worsening renal function.   Felicia Meres, MD 09/13/2011 8:17 AM

## 2011-09-13 NOTE — Progress Notes (Signed)
Clinical Social Worker followed up with patient regarding the bed offers received for SNF placement. Patient's daughter was at bedside and stated "we are still discussing." CSW encouraged patient and daughter that the earlier a decision is made the better as the availability at [facility] can change. CSW will continue to follow.   Rozetta Nunnery MSW, [employer] [phone]

## 2011-09-13 NOTE — Interval H&P Note (Signed)
History and Physical Interval Note:  09/13/2011 10:55 AM  [name]  has presented today for surgery, with the diagnosis of chf  The various methods of treatment have been discussed with the patient and family. After consideration of risks, benefits and other options for treatment, the patient has consented to  Procedure(s) (LRB): RIGHT HEART CATH (N/A) as a surgical intervention .  The patient's history has been reviewed, patient examined, no change in status, stable for surgery.  I have reviewed the patient's chart and labs.  Questions were answered to the patient's satisfaction.     Elyn Aquas.

## 2011-09-13 NOTE — CV Procedure (Signed)
    Cardiac Cath Note  GENIFER LAZENBY [id number] Feb 13, 1941  Procedure: Right Heart Cardiac Catheterization Note Indications: chronic dyspnea  Procedure Details Consent: Obtained Time Out: Verified patient identification, verified procedure, site/side was marked, verified correct patient position, special equipment/implants available, Radiology Safety Procedures followed,  medications/allergies/relevent history reviewed, required imaging and test results available.  Performed   Medications: Fentanyl: 25 mcg IV Versed: 1 mg IV  The right femoral vein was easily canulated using a modified Seldinger technique.  Hemodynamics:   RA: 12/13/7 RV: 40/9/13 PCWP: 16 PA:  45/17  (28)  Cardiac Output   Thermodilution:  4.16 Index 1.97   Fick :    4.09 Index 1.94 Arterial Sat: 94% ( estimated from pulse oxymetry) PA Sat: 52%.   Complications: No apparent complications Patient did tolerate procedure well.  Conclusions:   Mild pulmonary hypertension Otherwise normal Right heart pressures.  I suspect her severe dyspnea is due to severe COPD.  Vesta Mixer, Montez Hageman., MD, [facility] 09/13/2011, 11:33 AM Office - [phone] Pager [phone]

## 2011-09-13 NOTE — Progress Notes (Addendum)
PT Cancellation Note  Treatment cancelled today due to patient's refusal to participate despite encouragement.  pt educated on x3 refusals of therapy can equal discharge from therapy services. Pt states "there will be other therapist that will see me." Pt reports she is going for a procedure this morning & she does not want to do anything.  Will attempt back later today if time allows.     Verdell Face, [place] [phone] 09/13/2011

## 2011-09-14 DIAGNOSIS — R0602 Shortness of breath: Secondary | ICD-10-CM

## 2011-09-14 DIAGNOSIS — J449 Chronic obstructive pulmonary disease, unspecified: Secondary | ICD-10-CM

## 2011-09-14 DIAGNOSIS — I2789 Other specified pulmonary heart diseases: Secondary | ICD-10-CM

## 2011-09-14 LAB — GLUCOSE, CAPILLARY: Glucose-Capillary: 133 mg/dL — ABNORMAL HIGH (ref 70–99)

## 2011-09-14 LAB — BASIC METABOLIC PANEL
BUN: 66 mg/dL — ABNORMAL HIGH (ref 6–23)
CO2: 33 mEq/L — ABNORMAL HIGH (ref 19–32)
Calcium: 8.4 mg/dL (ref 8.4–10.5)
Creatinine, Ser: 2.61 mg/dL — ABNORMAL HIGH (ref 0.50–1.10)
Glucose, Bld: 105 mg/dL — ABNORMAL HIGH (ref 70–99)

## 2011-09-14 MED ORDER — IPRATROPIUM BROMIDE 0.02 % IN SOLN
0.5000 mg | Freq: Three times a day (TID) | RESPIRATORY_TRACT | Status: DC
  Administered 2011-09-14 – 2011-09-16 (×7): 0.5 mg via RESPIRATORY_TRACT
  Filled 2011-09-14 (×8): qty 2.5

## 2011-09-14 MED ORDER — ALBUTEROL SULFATE (5 MG/ML) 0.5% IN NEBU
2.5000 mg | INHALATION_SOLUTION | Freq: Three times a day (TID) | RESPIRATORY_TRACT | Status: DC
  Administered 2011-09-14 – 2011-09-16 (×7): 2.5 mg via RESPIRATORY_TRACT
  Filled 2011-09-14 (×8): qty 0.5

## 2011-09-14 MED ORDER — IPRATROPIUM BROMIDE 0.02 % IN SOLN: 0.5000 mg | Freq: Three times a day (TID) | RESPIRATORY_TRACT | Status: DC

## 2011-09-14 MED ORDER — ALBUTEROL SULFATE (5 MG/ML) 0.5% IN NEBU: 2.5000 mg | INHALATION_SOLUTION | Freq: Four times a day (QID) | RESPIRATORY_TRACT | Status: DC

## 2011-09-14 NOTE — Progress Notes (Signed)
CSW provided patient with bed offers again. CSW also attempted to speak with pt daughter regarding bed offers, csw left message. Pt shared with csw that patient had dialysis today and may be in room later. Pt agreed to show bed offers to patient daughter this evening to determine short term rehab snf. .Clinical social worker continuing to follow pt to assist with pt dc plans and further csw needs.   Catha Gosselin, Theresia Majors  [phone] .09/14/2011 1455pm

## 2011-09-14 NOTE — Progress Notes (Signed)
Pt. Transferred from unit 2900 via wheelchair. A/O, VSS, denies discomfort, oriented to room/ unit, tele in place [place], Graciella Belton, RN

## 2011-09-14 NOTE — Progress Notes (Signed)
Physical Therapy Treatment Patient Details Name: [name] MRN: [id number] DOB: 1940/12/31 Today's Date: 09/14/2011 Time: 3086-5784 PT Time Calculation (min): 15 min  PT Assessment / Plan / Recommendation Comments on Treatment Session  Pt more agreeable to participate in therapy today.  Upon arrival pt stating she needed to use the bathroom & went from supine>sitting EOB without any physical assistance.  Pt unsteady when taking steps but declined HHA.  Pt agreeable to use RW next session.      Follow Up Recommendations  Skilled nursing facility    Barriers to Discharge        Equipment Recommendations  Other (comment) (TBD)    Recommendations for Other Services    Frequency Min 3X/week   Plan Discharge plan remains appropriate    Precautions / Restrictions Precautions Precautions: Fall Restrictions Weight Bearing Restrictions: No       Mobility  Bed Mobility Bed Mobility: Supine to Sit;Sitting - Scoot to Edge of Bed Supine to Sit: 5: Supervision Sitting - Scoot to Edge of Bed: 5: Supervision Details for Bed Mobility Assistance: no physical assistance needed.   Transfers Transfers: Sit to Stand;Stand to [employer] Transfers Sit to Stand: 4: Min guard;With upper extremity assist;From bed;From chair/3-in-1 Stand to Sit: 4: Min guard;With upper extremity assist;With armrests;To chair/3-in-1 Stand Pivot Transfers: 4: Min guard Details for Transfer Assistance: Pt performed bed>3-in-1 without RW.  Pt  mildly unsteady  & would benefit from AD but when offered HHA  pt declined.   Ambulation/Gait Ambulation/Gait Assistance: 4: Min assist;4: Min guard Ambulation Distance (Feet):  (4-5 steps forwards x 2.  chair<>sink.  ) Assistive device: None;Other (Comment) (environmental surfaces) Ambulation/Gait Assistance Details: Pt unsteady with taking steps but when offered HHA she declined & utilized environmental surfaces to hold onto for support/stability.   Stairs:  No Wheelchair Mobility Wheelchair Mobility: No      PT Goals Acute Rehab PT Goals Time For Goal Achievement: 09/13/11 PT Goal: Supine/Side to Sit - Progress: Progressing toward goal PT Goal: Sit to Stand - Progress: Progressing toward goal PT Goal: Stand to Sit - Progress: Progressing toward goal PT Goal: Ambulate - Progress: Progressing toward goal  Visit Information  Last PT Received On: 09/14/11 Assistance Needed: +1    Subjective Data  Subjective: "I need to go to the bathroom"   Cognition  Overall Cognitive Status: Appears within functional limits for tasks assessed/performed Arousal/Alertness: Awake/alert Orientation Level: Appears intact for tasks assessed Behavior During Session: Flat affect    Balance     End of Session PT - End of Session Equipment Utilized During Treatment: Gait belt Patient left: in chair;with call bell/phone within reach Nurse Communication: Mobility status    Verdell Face, [place] [phone] 09/14/2011

## 2011-09-14 NOTE — Progress Notes (Signed)
[facility] Progress Note [place] TEAM 1 - Stepdown/ICU TEAM   [name] JXB:[id number] DOB: [birth year] DOA: 09/06/2011 PCP: Gwynneth Aliment, MD  Brief narrative: 71 y/o female admitted on 7/15 with worsening shortness of breath, she is on home oxygen with limited mobility due to underlying COPD. 2D echo in the hospital revealed EF 25-30%, and grade 3 diastolic dysfunction. Patient was found to have elevated BNP and was started on IV lasix. Patient did not improve and CCM was consulted, she was transferred to ICU for acute on chronic hypercarbic respiratory failure. Patient was put on Bipap and started on po prednisone. Cardiology was consulted and continued iv lasix.  7/19: Patient still has mild dyspnea at rest. Foley catheter is out, urine culture is negative.  7/20: Patient was started on nesitiride infusion last night as per CHF team and transferred to step down unit. At this time foley catheter is inserted and lasix has been increased to 40 mg iv q 8 hr.Patient denies dyspnea , and is diuresing well.  7/21: Patient is diuresing well, creatinine now starting to go up with brisk diuresis. On Nesitiride drip, Heart failure team is following.  Assessment/Plan:  Acute on chronic respiratory failure with hypercapnia  Multifactorial:  (COPD, Diastolic dysfunction, systolic dysfunction, PAH) - see discussion of each below  OSA/OHS non-compliant with CPAP Explained to patient the absolute need for nightly compliance with CPAP use  Tobacco use, ongoing Explained to patient the absolute need for complete and total abstinence from tobacco products  COPD with home O2 Felt to be quite severe - patient was still smoking up until the time of this hospital stay - Although she is dyspneic, there is no wheeze   Pulmonary HTN, severe by echo but mild by RHC Felt her symptoms are related to COPD  HTN Reasonably controlled at the present time  Type 2 diabetes mellitus with vascular  disease  Continue SSI - reasonably controlled at the present time  Acute systolic and diastolic heart failure, EF 20-25% As per heart failure team- Torsemide held today  Chronic kidney disease (CKD), stage III Due to diuresis- should improve not that diuretics have been decreased.   Hypomagnesemia stable  Morbid obesity  Code Status: Full Consultants: Heart Failure Team PCCM  HPI/Subjective: Pt has just used the bathroom and is very short of breath right now. Has a dry cough but this is not unusual for her. She states she is still deciding on whether she wants to go to a SNF.     Objective: Blood pressure 112/57, pulse 62, temperature 97.9 F (36.6 C), temperature source Oral, resp. rate 20, height 5\' 4"  (1.626 m), weight 112 kg (246 lb 14.6 oz), SpO2 92.00%.  Intake/Output Summary (Last 24 hours) at 09/14/11 1804 Last data filed at 09/14/11 1800  Gross per 24 hour  Intake    880 ml  Output    550 ml  Net    330 ml     Exam: General: No acute respiratory distress at rest Lungs: Very distant breath sounds throughout all fields with no wheezing at the present time- pt tachypneic Cardiovascular: Very distant heart sounds, regular rate and rhythm Abdomen: Obese soft bowel sounds present no organomegaly no rebound no ascites Extremities: No bilateral lower extremity edema  Data Reviewed: Basic Metabolic Panel:  Lab 09/14/11 1308 09/13/11 0502 09/12/11 1846 09/12/11 0520 09/11/11 0535 09/09/11 0837 09/07/11 1932  NA 137 137 136 138 140 -- --  K 4.0 3.7 3.8 3.7 4.0 -- --  CL 97 98 98 100 101 -- --  CO2 33* 28 31 34* 31 -- --  GLUCOSE 105* 127* 171* 96 102* -- --  BUN 66* 56* 52* 51* 41* -- --  CREATININE 2.61* 2.36* 2.22* 2.26* 1.96* -- --  CALCIUM 8.4 8.5 8.4 8.5 8.3* -- --  MG 1.6 -- -- -- 1.3* 1.2* 1.3*  PHOS -- -- -- -- -- -- --   Liver Function Tests:  Lab 09/13/11 0502 09/11/11 0535 09/07/11 1932  AST 19 11 13   ALT 19 6 9   ALKPHOS 41 44 58  BILITOT 0.2*  0.3 0.4  PROT 4.7* 5.1* 5.4*  ALBUMIN 1.9* 2.1* 2.1*   CBC:  Lab 09/13/11 0938 09/13/11 0502 09/11/11 0535 09/10/11 0529 09/09/11 0837  WBC 7.9 8.5 7.2 7.6 6.7  NEUTROABS -- -- -- -- --  HGB 12.4 12.0 11.3* 11.3* 11.6*  HCT 40.9 39.8 37.0 37.5 38.9  MCV 81.8 81.7 79.6 80.8 82.6  PLT 177 192 195 180 166   CBG:  Lab 09/14/11 1633 09/14/11 1223 09/14/11 0825 09/13/11 2144 09/13/11 1557  GLUCAP 138* 133* 97 150* 203*    Recent Results (from the past 240 hour(s))  URINE CULTURE     Status: Normal   Collection Time   09/06/11  8:46 AM      Component Value Range Status Comment   Specimen Description URINE, CATHETERIZED   Final    Special Requests NONE   Final    Culture  Setup Time 09/06/2011 09:16   Final    Colony Count NO GROWTH   Final    Culture NO GROWTH   Final    Report Status 09/07/2011 FINAL   Final   MRSA PCR SCREENING     Status: Normal   Collection Time   09/07/11  6:04 PM      Component Value Range Status Comment   MRSA by PCR NEGATIVE  NEGATIVE Final      Studies:  Recent x-ray studies have been reviewed in detail by the Attending Physician  Scheduled Meds:  Reviewed in detail by the Attending Physician    Calvert Cantor, MD [phone]  If 7PM-7AM, please contact night-coverage [email or web address] Password [email or web address] 09/14/2011, 6:04 PM   LOS: 8 days

## 2011-09-14 NOTE — Progress Notes (Signed)
Patient ID: [name], female   DOB: 07-06-1940, 71 y.o.   MRN: [id number]    SUBJECTIVE: RHC results from yesterday noted.  Filling pressures not significantly elevated with mild pulmonary HTN.  She is wearing her CPAP this morning, feels ok.  Walked to bathroom yesterday.      Marland Kitchen albuterol  2.5 mg Nebulization QID   And  . ipratropium  0.5 mg Nebulization QID  . aspirin      . aspirin EC  81 mg Oral Daily  . atorvastatin  10 mg Oral q1800  . carvedilol  12.5 mg Oral BID WC  . fentaNYL      . heparin      . heparin subcutaneous  5,000 Units Subcutaneous Q8H  . hydrALAZINE  50 mg Oral Q8H  . insulin aspart  0-15 Units Subcutaneous TID WC  . insulin aspart  0-5 Units Subcutaneous QHS  . isosorbide mononitrate  60 mg Oral Daily  . lidocaine      . magnesium chloride  2 tablet Oral Daily  . midazolam      . moxifloxacin  400 mg Oral Q2000  . nicotine  21 mg Transdermal Daily  . nitroGLYCERIN      . paricalcitol  1 mcg Oral Daily  . predniSONE  30 mg Oral Q breakfast   Followed by  . predniSONE  20 mg Oral Q breakfast   Followed by  . predniSONE  10 mg Oral Q breakfast   Followed by  . predniSONE  5 mg Oral Q breakfast  . sodium chloride  3 mL Intravenous Q12H  . DISCONTD: aspirin  324 mg Oral Pre-Cath  . DISCONTD: furosemide  40 mg Intravenous Q8H  . DISCONTD: predniSONE  40 mg Oral Q breakfast  . DISCONTD: sodium chloride  3 mL Intravenous Q12H  . DISCONTD: torsemide  20 mg Oral BID    Filed Vitals:   09/14/11 0022 09/14/11 0400 09/14/11 0441 09/14/11 0500  BP:   134/57   Pulse:  61    Temp: 98.7 F (37.1 C)  97.8 F (36.6 C)   TempSrc: Oral  Oral   Resp:  16    Height:      Weight:    246 lb 14.6 oz (112 kg)  SpO2:  98%      Intake/Output Summary (Last 24 hours) at 09/14/11 0744 Last data filed at 09/14/11 0400  Gross per 24 hour  Intake  899.9 ml  Output    950 ml  Net  -50.1 ml    LABS: Basic Metabolic Panel:  Basename 09/14/11 0507  09/13/11 0502  NA 137 137  K 4.0 3.7  CL 97 98  CO2 33* 28  GLUCOSE 105* 127*  BUN 66* 56*  CREATININE 2.61* 2.36*  CALCIUM 8.4 8.5  MG 1.6 --  PHOS -- --   Liver Function Tests:  Basename 09/13/11 0502  AST 19  ALT 19  ALKPHOS 41  BILITOT 0.2*  PROT 4.7*  ALBUMIN 1.9*   No results found for this basename: LIPASE:2,AMYLASE:2 in the last 72 hours CBC:  Basename 09/13/11 0938 09/13/11 0502  WBC 7.9 8.5  NEUTROABS -- --  HGB 12.4 12.0  HCT 40.9 39.8  MCV 81.8 81.7  PLT 177 192   Cardiac Enzymes: No results found for this basename: CKTOTAL:3,CKMB:3,CKMBINDEX:3,TROPONINI:3 in the last 72 hours BNP: No components found with this basename: POCBNP:3 D-Dimer: No results found for this basename: DDIMER:2 in the last 72 hours Hemoglobin  A1C: No results found for this basename: HGBA1C in the last 72 hours Fasting Lipid Panel: No results found for this basename: CHOL,HDL,LDLCALC,TRIG,CHOLHDL,LDLDIRECT in the last 72 hours Thyroid Function Tests: No results found for this basename: TSH,T4TOTAL,FREET3,T3FREE,THYROIDAB in the last 72 hours Anemia Panel: No results found for this basename: VITAMINB12,FOLATE,FERRITIN,TIBC,IRON,RETICCTPCT in the last 72 hours  RADIOLOGY: Dg Chest Port 1 View  09/10/2011  *RADIOLOGY REPORT*  Clinical Data: Respiratory distress.  PORTABLE CHEST - 1 VIEW  Comparison: Portable chest 09/09/2011.  Findings: The heart is enlarged.  Retrocardiac opacification is stable.  Pulmonary vascular congestion and bilateral pleural effusions remain.  Mild right basilar airspace disease is unchanged.  IMPRESSION:  1.  Stable appearance of retrocardiac opacification concerning for atelectasis or infection. 2.  Persistent bilateral pleural effusions. 3.  Stable cardiomegaly and pulmonary vascular congestion. 4.  Mild atelectasis versus infection at the right lung base is stable.  Original Report Authenticated By: Jamesetta Orleans. MATTERN, M.D.    PHYSICAL EXAM General:  Chronically ill appearing.  HEENT: normal  Neck: supple. JVP difficult. Carotids 2+ bilat; no bruits. No lymphadenopathy or thryomegaly appreciated.  Cor: Distant. Regular rate & rhythm. No rubs, gallops. 2/6 systolic murmur LLSB.  Lungs: distant with crackles at bases.  Abdomen: soft, nontender, nondistended. No hepatosplenomegaly. No bruits or masses. Good bowel sounds.  Extremities: no cyanosis, clubbing, rash, 1+ edema to knee bilaterally.  Neuro: alert & orientedx3, cranial nerves grossly intact. moves all 4 extremities w/o difficulty. Affect pleasant  TELEMETRY: Reviewed telemetry pt in NSR  ASSESSMENT AND PLAN: 1. Acute systolic and diastolic heart failure, EF 20-25%: This may be an ischemic cardiomyopathy given septal wall motion abnormality on echo.  Exam is difficult for volume status.  RHC yesterday showed that filling pressures are not significantly elevated and there is mild pulmonary HTN with PVR 3 WU.   It appears that the major contributor to dyspnea at this point is COPD, OHS/OSA.  - Hold torsemide today with rising creatinine.  - Continue current Coreg, hydralazine/Imdur.  No ACEI with renal dysfunction.  2. CAD: Suspect ischemic CMP with wall motion abnormality. She is on ASA and statin. No LHC with renal dysfunction at this point.  Will treat for secondary prevention.  3. Acute on chronic renal failure, probably cardiorenal.  Creatinine worse this am, holding torsemide.  4. Obesity hypoventilation syndrome/OSA:  - Continue to wear CPAP  - sleep study will need to be scheduled  5. COPD with continuous O2.  On prednisone taper.  6. HTN: SBP 120s-140s. 7. Pulmonary HTN: Severe by echo but only mild on RHC after diuresis.  Suspect has PAH secondary to COPD, OHS/OSA at this point.  Likely had component of pulmonary venous hypertension as well prior to diuresis.  Needs to wear O2 and CPAP.   8. Dispo: SNF per primary team.  Can go to telemetry.   [name] 09/14/2011 7:44  AM

## 2011-09-14 NOTE — Progress Notes (Signed)
Occupational Therapy Evaluation Patient Details Name: [name] MRN: [id number] DOB: 02-Dec-1940 Today's Date: 09/14/2011 Time: 0752-0807 OT Time Calculation (min): 15 min  OT Assessment / Plan / Recommendation Clinical Impression  71 y.o. patient presented to ED with CHF. Pt. with prolonged hospitalization and has been refusing therapy. Pt. has decreased UE weakness.  Patient does not have 24 hour care and would recommend SNF. Pt. will benefit from OT to maximize independence and safety in ADLs prior to d/c. OT to follow acutely.    OT Assessment  Patient needs continued OT Services    Follow Up Recommendations  Skilled nursing facility    Barriers to Discharge Decreased caregiver support    Equipment Recommendations  Other (comment) (TBD)    Recommendations for Other Services    Frequency  Min 2X/week    Precautions / Restrictions Precautions Precautions: Fall Restrictions Weight Bearing Restrictions: No   Pertinent Vitals/Pain Pt. O2 dropped in 80's on room air during session. No pain reported.     ADL  Grooming: Performed;Wash/dry hands;Min guard Where Assessed - Grooming: Supported standing Lower Body Dressing: Simulated;+1 Total assistance Where Assessed - Lower Body Dressing: Supported sitting Toilet Transfer: Simulated;Min Pension scheme manager Method: Sit to Barista: Materials engineer and Hygiene: Performed;Minimal assistance Where Assessed - Glass blower/designer Manipulation and Hygiene: Standing Transfers/Ambulation Related to ADLs: Pt. ambulated to sink with Min A for balance and management of lines. Pt. was furniture walking and declined using the therapist's hands for support while walking.  ADL Comments: Pt. performed toilet transfer with Min A to ambulate and Min G for sit to stand transfer. Pt. performed hygiene with Min A for balance. Pt. washed hands while leaning on sink (standing) with Min G  assist. Pt. able to wash face with wash cloth with Setup A.     OT Diagnosis: Generalized weakness  OT Problem List: Decreased strength;Impaired balance (sitting and/or standing);Decreased activity tolerance;Decreased knowledge of use of DME or AE;Cardiopulmonary status limiting activity OT Treatment Interventions: Self-care/ADL training;Therapeutic exercise;DME and/or AE instruction;Energy conservation;Therapeutic activities;Patient/family education;Balance training   OT Goals Acute Rehab OT Goals OT Goal Formulation: With patient Time For Goal Achievement: 09/28/11 Potential to Achieve Goals: Fair ADL Goals Pt Will Perform Grooming: Standing at sink;with set-up ADL Goal: Grooming - Progress: Goal set today Pt Will Perform Upper Body Bathing: Sitting at sink;with set-up ADL Goal: Upper Body Bathing - Progress: Goal set today Pt Will Perform Lower Body Bathing: with mod assist;Sit to stand from chair ADL Goal: Lower Body Bathing - Progress: Goal set today Pt Will Perform Upper Body Dressing: Sitting, chair;Sitting, bed;with set-up ADL Goal: Upper Body Dressing - Progress: Goal set today Pt Will Perform Lower Body Dressing: with mod assist;Sit to stand from bed;Sit to stand from chair ADL Goal: Lower Body Dressing - Progress: Goal set today Pt Will Transfer to Toilet: 3-in-1;with modified independence ADL Goal: Toilet Transfer - Progress: Goal set today Pt Will Perform Toileting - Clothing Manipulation: with modified independence;Standing ADL Goal: Toileting - Clothing Manipulation - Progress: Goal set today Pt Will Perform Toileting - Hygiene: with modified independence;Sit to stand from 3-in-1/toilet ADL Goal: Toileting - Hygiene - Progress: Goal set today Miscellaneous OT Goals Miscellaneous OT Goal #1: Pt. will verbalize at least 3 energy conservation techniques.  OT Goal: Miscellaneous Goal #1 - Progress: Goal set today  Visit Information  Last OT Received On:  09/14/11 Assistance Needed: +1 PT/OT Co-Evaluation/Treatment: Yes    Subjective Data  Subjective: I have to use the bathroom.   Prior Functioning  Vision/Perception  Home Living Lives With: Alone Available Help at Discharge: Family;Available PRN/intermittently Type of Home: House Home Access: Ramped entrance Home Layout: One level Bathroom Shower/Tub: Engineer, manufacturing systems: Standard Home Adaptive Equipment: Straight cane;Tub transfer bench Additional Comments: Patient is non-compliant with medications and treatments at home per daughter. She can ambulate no more than approx 10 feet and this can take 5 minutes. Prior Function Level of Independence: Needs assistance Needs Assistance: Bathing;Dressing;Meal Prep;Light Housekeeping Bath: Moderate Dressing: Moderate Meal Prep: Total Light Housekeeping: Total Comments:  (spinge bath qod alternate days into tub.) Communication Communication: No difficulties Dominant Hand: Right      Cognition  Overall Cognitive Status: Appears within functional limits for tasks assessed/performed Arousal/Alertness: Awake/alert Orientation Level: Appears intact for tasks assessed Behavior During Session: [facility] for tasks performed    Extremity/Trunk Assessment Right Upper Extremity Assessment RUE ROM/Strength/Tone: Deficits RUE ROM/Strength/Tone Deficits: 3/5 in shoulder strength Left Upper Extremity Assessment LUE ROM/Strength/Tone: Deficits LUE ROM/Strength/Tone Deficits: 3/5 in shoulder strength   Mobility Bed Mobility Bed Mobility: Supine to Sit Supine to Sit: 5: Supervision Transfers Transfers: Sit to Stand;Stand to Sit Sit to Stand: 4: Min guard;From bed;With upper extremity assist Stand to Sit: 4: Min guard;With upper extremity assist;To chair/3-in-1           End of Session OT - End of Session Activity Tolerance: Patient tolerated treatment well Patient left: in chair;with call bell/phone within reach  GO     Jenell Milliner 09/14/2011, 8:32 AM

## 2011-09-14 NOTE — Progress Notes (Signed)
I agree with the following treatment note after reviewing documentation.   Johnston, Korby Ratay Brynn   OTR/L Pager: [phone] Office: [phone] .   

## 2011-09-15 DIAGNOSIS — I509 Heart failure, unspecified: Secondary | ICD-10-CM

## 2011-09-15 LAB — GLUCOSE, CAPILLARY
Glucose-Capillary: 75 mg/dL (ref 70–99)
Glucose-Capillary: 137 mg/dL — ABNORMAL HIGH (ref 70–99)
Glucose-Capillary: 156 mg/dL — ABNORMAL HIGH (ref 70–99)

## 2011-09-15 LAB — BASIC METABOLIC PANEL
BUN: 73 mg/dL — ABNORMAL HIGH (ref 6–23)
Calcium: 8.2 mg/dL — ABNORMAL LOW (ref 8.4–10.5)
Creatinine, Ser: 2.64 mg/dL — ABNORMAL HIGH (ref 0.50–1.10)
GFR calc non Af Amer: 17 mL/min — ABNORMAL LOW (ref >90)
Glucose, Bld: 95 mg/dL (ref 70–99)
Sodium: 137 mEq/L (ref 135–145)

## 2011-09-15 NOTE — Progress Notes (Signed)
Subjective: Patient breathing better. No SOB. No worsening cough.   Objective: Filed Vitals:   09/14/11 2200 09/14/11 2217 09/15/11 0500 09/15/11 0646  BP:  144/57 127/65 150/63  Pulse: 63  66 68  Temp:   98.6 F (37 C)   TempSrc:   Oral   Resp: 18  18   Height:      Weight:      SpO2: 98%  95%    Weight change:    General: Alert, awake, oriented x3, in no acute distress.  HEENT: No bruits, no goiter.  Heart: Regular rate and rhythm, without murmurs, rubs, gallops.  Lungs:CTA, bilateral air movement.  Abdomen: Soft, nontender, nondistended, positive bowel sounds.  Neuro: Grossly intact, nonfocal. Extremities; trace edema.   Lab Results:  [facility] 09/15/11 0420 09/14/11 0507  NA 137 137  K 4.1 4.0  CL 97 97  CO2 31 33*  GLUCOSE 95 105*  BUN 73* 66*  CREATININE 2.64* 2.61*  CALCIUM 8.2* 8.4  MG -- 1.6  PHOS -- --    Basename 09/13/11 0502  AST 19  ALT 19  ALKPHOS 41  BILITOT 0.2*  PROT 4.7*  ALBUMIN 1.9*   Basename 09/13/11 0938 09/13/11 0502  WBC 7.9 8.5  NEUTROABS -- --  HGB 12.4 12.0  HCT 40.9 39.8  MCV 81.8 81.7  PLT 177 192   Micro Results: Recent Results (from the past 240 hour(s))  URINE CULTURE     Status: Normal   Collection Time   09/06/11  8:46 AM      Component Value Range Status Comment   Specimen Description URINE, CATHETERIZED   Final    Special Requests NONE   Final    Culture  Setup Time 09/06/2011 09:16   Final    Colony Count NO GROWTH   Final    Culture NO GROWTH   Final    Report Status 09/07/2011 FINAL   Final   MRSA PCR SCREENING     Status: Normal   Collection Time   09/07/11  6:04 PM      Component Value Range Status Comment   MRSA by PCR NEGATIVE  NEGATIVE Final     Studies/Results: No results found.  Medications: I have reviewed the patient's current medications.  1-Acute on chronic respiratory failure with hypercapnia  Multifactorial: (COPD, Diastolic dysfunction, systolic dysfunction, PAH)  Improved.    2-OSA/OHS non-compliant with CPAP  Continue with CPAP.   3-Tobacco use, ongoing  Counseling provide.   4-COPD with home O2  Felt to be quite severe. Continue wit prednisone taper , nebulizer treatments.  Avelox day 8/10.   5-Pulmonary HTN, severe by echo but mild by RHC  Felt her symptoms are related to COPD   6-HTN  Continue with coreg, hydralazine.   7-Type 2 diabetes mellitus with vascular disease  Continue SSI - reasonably controlled at the present time   8-Acute systolic and diastolic heart failure, EF 20-25%  As per heart failure team- Torsemide held since 7-23. No ACE due to renal failure.   9-Chronic kidney disease (CKD), stage III  Due to diuresis- should improve not that diuretics have been decreased. Will repeat tomorrow if stable and if ok with cardio will consider transfer patient to SNF tomorrow.   10-Hypomagnesemia  Stable . I will check Mg level.   I spoke with daughter, gave update of medical condition. She agree with SNF.  Plan for discharge tomorrow if renal function stable. Will need assistance from cardio for torsemide  dose at discharge.   Code Status: Full  Consultants:  Heart Failure Team  PCCM      LOS: 9 days   REGALADO,BELKYS M.D.  [facility] 09/15/2011, 8:50 AM

## 2011-09-15 NOTE — Progress Notes (Signed)
Patient presented with serosanguinous drainage near catheter incision site. Patient c/o tenderness. Site soft.  Pressure dressing applied. Will continue to monitor.

## 2011-09-15 NOTE — Progress Notes (Signed)
Patient ID: [name], female   DOB: December 26, 1940, 71 y.o.   MRN: [id number]     SUBJECTIVE: Stable.  Patient working with PT, doing some walking.  Creatinine still elevated.      Marland Kitchen albuterol  2.5 mg Nebulization TID   And  . ipratropium  0.5 mg Nebulization TID  . aspirin EC  81 mg Oral Daily  . atorvastatin  10 mg Oral q1800  . carvedilol  12.5 mg Oral BID WC  . heparin subcutaneous  5,000 Units Subcutaneous Q8H  . hydrALAZINE  50 mg Oral Q8H  . insulin aspart  0-15 Units Subcutaneous TID WC  . insulin aspart  0-5 Units Subcutaneous QHS  . isosorbide mononitrate  60 mg Oral Daily  . magnesium chloride  2 tablet Oral Daily  . moxifloxacin  400 mg Oral Q2000  . nicotine  21 mg Transdermal Daily  . paricalcitol  1 mcg Oral Daily  . predniSONE  30 mg Oral Q breakfast   Followed by  . predniSONE  20 mg Oral Q breakfast   Followed by  . predniSONE  10 mg Oral Q breakfast   Followed by  . predniSONE  5 mg Oral Q breakfast    Filed Vitals:   09/15/11 0500 09/15/11 0646 09/15/11 0851 09/15/11 1119  BP: 127/65 150/63  126/65  Pulse: 66 68  59  Temp: 98.6 F (37 C)     TempSrc: Oral     Resp: 18     Height:      Weight:      SpO2: 95%  94%     Intake/Output Summary (Last 24 hours) at 09/15/11 1337 Last data filed at 09/14/11 1800  Gross per 24 hour  Intake      0 ml  Output    200 ml  Net   -200 ml    LABS: Basic Metabolic Panel:  Basename 09/15/11 0420 09/14/11 0507  NA 137 137  K 4.1 4.0  CL 97 97  CO2 31 33*  GLUCOSE 95 105*  BUN 73* 66*  CREATININE 2.64* 2.61*  CALCIUM 8.2* 8.4  MG -- 1.6  PHOS -- --   Liver Function Tests:  Basename 09/13/11 0502  AST 19  ALT 19  ALKPHOS 41  BILITOT 0.2*  PROT 4.7*  ALBUMIN 1.9*   No results found for this basename: LIPASE:2,AMYLASE:2 in the last 72 hours CBC:  Basename 09/13/11 0938 09/13/11 0502  WBC 7.9 8.5  NEUTROABS -- --  HGB 12.4 12.0  HCT 40.9 39.8  MCV 81.8 81.7  PLT 177 192    Cardiac Enzymes: No results found for this basename: CKTOTAL:3,CKMB:3,CKMBINDEX:3,TROPONINI:3 in the last 72 hours BNP: No components found with this basename: POCBNP:3 D-Dimer: No results found for this basename: DDIMER:2 in the last 72 hours Hemoglobin A1C: No results found for this basename: HGBA1C in the last 72 hours Fasting Lipid Panel: No results found for this basename: CHOL,HDL,LDLCALC,TRIG,CHOLHDL,LDLDIRECT in the last 72 hours Thyroid Function Tests: No results found for this basename: TSH,T4TOTAL,FREET3,T3FREE,THYROIDAB in the last 72 hours Anemia Panel: No results found for this basename: VITAMINB12,FOLATE,FERRITIN,TIBC,IRON,RETICCTPCT in the last 72 hours  RADIOLOGY: Dg Chest Port 1 View  09/10/2011  *RADIOLOGY REPORT*  Clinical Data: Respiratory distress.  PORTABLE CHEST - 1 VIEW  Comparison: Portable chest 09/09/2011.  Findings: The heart is enlarged.  Retrocardiac opacification is stable.  Pulmonary vascular congestion and bilateral pleural effusions remain.  Mild right basilar airspace disease is unchanged.  IMPRESSION:  1.  Stable appearance of retrocardiac opacification concerning for atelectasis or infection. 2.  Persistent bilateral pleural effusions. 3.  Stable cardiomegaly and pulmonary vascular congestion. 4.  Mild atelectasis versus infection at the right lung base is stable.  Original Report Authenticated By: Jamesetta Orleans. MATTERN, M.D.    PHYSICAL EXAM General: Chronically ill appearing.  HEENT: normal  Neck: supple. JVP difficult. Carotids 2+ bilat; no bruits. No lymphadenopathy or thryomegaly appreciated.  Cor: Distant. Regular rate & rhythm. No rubs, gallops. 2/6 systolic murmur LLSB.  Lungs: distant with crackles at bases.  Abdomen: soft, nontender, nondistended. No hepatosplenomegaly. No bruits or masses. Good bowel sounds.  Extremities: no cyanosis, clubbing, rash, 1+ edema to knee bilaterally.  Neuro: alert & orientedx3, cranial nerves grossly  intact. moves all 4 extremities w/o difficulty. Affect pleasant  TELEMETRY: Reviewed telemetry pt in NSR  ASSESSMENT AND PLAN: 1. Acute systolic and diastolic heart failure, EF 20-25%: This may be an ischemic cardiomyopathy given septal wall motion abnormality on echo.  Exam is difficult for volume status.  RHC yesterday showed that filling pressures are not significantly elevated and there is mild pulmonary HTN with PVR 3 WU.   It appears that the major contributor to dyspnea at this point is COPD, OHS/OSA.  - Hold torsemide again today with elevated BUN/creatinine.  - Continue current Coreg, hydralazine/Imdur.  No ACEI with renal dysfunction.  2. CAD: Suspect ischemic CMP with wall motion abnormality. She is on ASA and statin. No LHC with renal dysfunction at this point.  Will treat for secondary prevention.  3. Acute on chronic renal failure, probably cardiorenal.  Creatinine worse this am, holding torsemide.  4. Obesity hypoventilation syndrome/OSA:  - Continue to wear CPAP  - sleep study will need to be scheduled  5. COPD with continuous O2.  On prednisone taper.  6. HTN: SBP 120s-140s. 7. Pulmonary HTN: Severe by echo but only mild on RHC after diuresis.  Suspect has PAH secondary to COPD, OHS/OSA at this point.  Likely had component of pulmonary venous hypertension as well prior to diuresis.  Needs to wear O2 and CPAP.   8. Dispo: SNF per primary team.    [name] 09/15/2011 1:37 PM

## 2011-09-15 NOTE — Progress Notes (Signed)
CSW spoke with pt dtr by phone, who was unavailable to speak to csw. CSW asked pt daughter to call csw back when on break at work to discuss bed offers. Pt daughter agreed and stated that she would be back at the hospital at 6pm. CSW will inform pt RN of bed offers in patient room to assist with bed offer choice.   .Clinical social worker continuing to follow pt to assist with pt dc plans and further csw needs.   Catha Gosselin, Theresia Majors  [phone] .09/15/2011 10:29am

## 2011-09-15 NOTE — Progress Notes (Signed)
I agree with the following treatment note after reviewing documentation.   Johnston, Charbel Los Brynn   OTR/L Pager: [phone] Office: [phone] .   

## 2011-09-15 NOTE — Progress Notes (Signed)
Occupational Therapy Treatment Patient Details Name: [name] MRN: [id number] DOB: 04/18/1940 Today's Date: 09/15/2011 Time: 4098-1191 OT Time Calculation (min): 11 min  OT Assessment / Plan / Recommendation Comments on Treatment Session Pt. demonstrated DOE. Pt. is progressing towards goals.     Follow Up Recommendations  Skilled nursing facility    Barriers to Discharge       Equipment Recommendations  Defer to next venue    Recommendations for Other Services    Frequency Min 2X/week   Plan Discharge plan remains appropriate    Precautions / Restrictions Precautions Precautions: Fall Restrictions Weight Bearing Restrictions: No   Pertinent Vitals/Pain DOE. Reported pain in Rt. Leg but did not quantify.     ADL  Toilet Transfer: Performed;Min guard Statistician Method: Surveyor, minerals: Materials engineer and Hygiene: Performed;Min guard Where Assessed - Engineer, mining and Hygiene: Standing Equipment Used: Gait belt;Rolling walker Transfers/Ambulation Related to ADLs: Pt. ambulated to chair on other side of bed with RW with Min G. OT educated pt. on walker safety and correct use. She demonstrated extreme DOE after walking to chair.  ADL Comments: Pt. performed toilet transfer with Min G assist. She was able to perform hygiene/clothing manipulation with Min G.      OT Goals Acute Rehab OT Goals OT Goal Formulation: With patient Time For Goal Achievement: 09/28/11 Potential to Achieve Goals: Fair ADL Goals Pt Will Perform Grooming: Standing at sink;with set-up Pt Will Perform Upper Body Bathing: Sitting at sink;with set-up Pt Will Perform Lower Body Bathing: with mod assist;Sit to stand from chair Pt Will Perform Upper Body Dressing: Sitting, chair;Sitting, bed;with set-up Pt Will Perform Lower Body Dressing: with mod assist;Sit to stand from bed;Sit to stand from chair Pt Will  Transfer to Toilet: 3-in-1;with modified independence ADL Goal: Toilet Transfer - Progress: Progressing toward goals Pt Will Perform Toileting - Clothing Manipulation: with modified independence;Standing ADL Goal: Toileting - Clothing Manipulation - Progress: Progressing toward goals Pt Will Perform Toileting - Hygiene: with modified independence;Sit to stand from 3-in-1/toilet ADL Goal: Toileting - Hygiene - Progress: Progressing toward goals Miscellaneous OT Goals Miscellaneous OT Goal #1: Pt. will verbalize at least 3 energy conservation techniques.   Visit Information  Last OT Received On: 09/15/11 Assistance Needed: +1    Subjective Data   Pt. Reported she had to use the bathroom.    Prior Functioning       Cognition  Overall Cognitive Status: Appears within functional limits for tasks assessed/performed Arousal/Alertness: Awake/alert Orientation Level: Appears intact for tasks assessed Behavior During Session: [facility] for tasks performed    Mobility Bed Mobility Bed Mobility: Supine to Sit;Sitting - Scoot to Edge of Bed Supine to Sit: HOB elevated;5: Supervision Sitting - Scoot to Edge of Bed: 5: Supervision Transfers Transfers: Sit to Stand;Stand to Sit Sit to Stand: 4: Min guard;From bed;With upper extremity assist Stand to Sit: To chair/3-in-1;4: Min assist Details for Transfer Assistance: Min A to control decent to chair after walking.           End of Session OT - End of Session Activity Tolerance: Patient tolerated treatment well;Patient limited by fatigue Patient left: in chair;with call bell/phone within reach;with chair alarm set  GO     [name], [name] 09/15/2011, 10:46 AM

## 2011-09-16 LAB — GLUCOSE, CAPILLARY
Glucose-Capillary: 97 mg/dL (ref 70–99)
Glucose-Capillary: 101 mg/dL — ABNORMAL HIGH (ref 70–99)
Glucose-Capillary: 137 mg/dL — ABNORMAL HIGH (ref 70–99)

## 2011-09-16 LAB — BASIC METABOLIC PANEL
Chloride: 96 mEq/L (ref 96–112)
GFR calc Af Amer: 21 mL/min — ABNORMAL LOW (ref >90)
GFR calc non Af Amer: 18 mL/min — ABNORMAL LOW (ref >90)
Potassium: 4.6 mEq/L (ref 3.5–5.1)
Sodium: 135 mEq/L (ref 135–145)

## 2011-09-16 LAB — MAGNESIUM: Magnesium: 1.8 mg/dL (ref 1.5–2.5)

## 2011-09-16 MED ORDER — INSULIN ASPART 100 UNIT/ML ~~LOC~~ SOLN: 0.0000 [IU] | Freq: Three times a day (TID) | SUBCUTANEOUS | Status: DC

## 2011-09-16 MED ORDER — NICOTINE 21 MG/24HR TD PT24: 1.0000 | MEDICATED_PATCH | Freq: Every day | TRANSDERMAL | Status: AC

## 2011-09-16 MED ORDER — TORSEMIDE 20 MG PO TABS: 20.0000 mg | ORAL_TABLET | Freq: Two times a day (BID) | ORAL | Status: DC

## 2011-09-16 MED ORDER — HYDRALAZINE HCL 50 MG PO TABS: 50.0000 mg | ORAL_TABLET | Freq: Three times a day (TID) | ORAL | Status: DC

## 2011-09-16 MED ORDER — IPRATROPIUM BROMIDE 0.02 % IN SOLN: 0.5000 mg | Freq: Three times a day (TID) | RESPIRATORY_TRACT | Status: DC

## 2011-09-16 MED ORDER — ATORVASTATIN CALCIUM 10 MG PO TABS: 10.0000 mg | ORAL_TABLET | Freq: Every day | ORAL | Status: DC

## 2011-09-16 MED ORDER — ALBUTEROL SULFATE (5 MG/ML) 0.5% IN NEBU: 2.5000 mg | INHALATION_SOLUTION | Freq: Three times a day (TID) | RESPIRATORY_TRACT | Status: DC

## 2011-09-16 MED ORDER — CARVEDILOL 12.5 MG PO TABS: 12.5000 mg | ORAL_TABLET | Freq: Two times a day (BID) | ORAL | Status: DC

## 2011-09-16 MED ORDER — MOXIFLOXACIN HCL 400 MG PO TABS: 400.0000 mg | ORAL_TABLET | Freq: Every day | ORAL | Status: AC

## 2011-09-16 MED ORDER — ISOSORBIDE MONONITRATE ER 60 MG PO TB24: 60.0000 mg | ORAL_TABLET | Freq: Every day | ORAL | Status: DC

## 2011-09-16 MED ORDER — ASPIRIN 81 MG PO TBEC: 81.0000 mg | DELAYED_RELEASE_TABLET | Freq: Every day | ORAL | Status: DC

## 2011-09-16 MED ORDER — PREDNISONE 5 MG PO TABS: ORAL_TABLET | ORAL | Status: AC

## 2011-09-16 NOTE — Care Management Note (Signed)
    Page 1 of 1   09/16/2011     11:12:21 AM   CARE MANAGEMENT NOTE 09/16/2011  Patient:  [name], [name]   Account Number:  [id number]  Date Initiated:  09/09/2011  Documentation initiated by:  Avie Arenas  Subjective/Objective Assessment:   resp failure.  Lives alone - has daughter.     Action/Plan:   Anticipated DC Date:  09/15/2011   Anticipated DC Plan:  HOME W HOME HEALTH SERVICES  In-house referral  Clinical Social Worker      DC Planning Services  CM consult      Choice offered to / List presented to:             Status of service:  Completed, signed off Medicare Important Message given?   (If response is "NO", the following Medicare IM given date fields will be blank) Date Medicare IM given:   Date Additional Medicare IM given:    Discharge Disposition:  SKILLED NURSING FACILITY  Per UR Regulation:  Reviewed for med. necessity/level of care/duration of stay  If discussed at Long Length of Stay Meetings, dates discussed:   09/14/2011  09/16/2011    Comments:  ContactSunday Corn Daughter [phone]  09/16/11- 1100- Donn Pierini RN, BSN [phone] Pt for d/c to SNF today, CSW following for placement needs.  09/13/11 1630 Henrietta Mayo RN MSN CCM PT recommending SNF, CSW has given pt and daughter bed offers.

## 2011-09-16 NOTE — Discharge Summary (Signed)
Physician Discharge Summary  [name] JWJ:[id number] DOB: 07/27/1940 DOA: 09/06/2011  PCP: Gwynneth Aliment, MD  Admit date: 09/06/2011 Discharge date: 09/16/2011  Discharge Diagnoses:   Acute respiratory failure with hypercapnia  Type 2 diabetes mellitus with vascular disease  Hyperlipdemia  Hypertensive heart disease with congestive heart failure  Chronic cor pulmonale  C O P D  Chronic kidney disease (CKD), stage IV (severe)  Acute systolic congestive heart failure  Cardiomyopathy of undetermined type  Other chronic pulmonary heart diseases  Acute on chronic renal failure   Discharge Condition: Stable.   Disposition: Needs to follow up with hearth failure clinic in 10 days. She will need B-Met in 48 hours.   Diet: Hearth Healthy diet.   History of present illness:  Patient is a 71 year old African American female with past medical history significant for hypertension congestive heart failure, angina, diabetes dyslipidemia COPD and tobacco use. Patient presented to the emergency room secondary to increasing shortness of breath. Patient stated that shortness of breath has been getting worse over the past couple of weeks. She uses home oxygen and has been smoking with the oxygen. She smokes about a pack a day. She complains of a dry cough. No fevers no chills she has not been really taking her medications. She's had no chest pain she does complain of increasing lower extremity edema.   Hospital Course:  1-Acute on chronic respiratory failure with hypercapnia  Multifactorial: (COPD, Diastolic dysfunction, systolic dysfunction, PAH)  Improved.   2-OSA/OHS non-compliant with CPAP  Continue with CPAP.   3-Tobacco use, ongoing  Counseling provide.   4-COPD with home O2  Felt to be quite severe. Continue wit prednisone taper , nebulizer treatments.  Avelox day 9/10.   5-Pulmonary HTN, severe by echo but mild by RHC  Felt her symptoms are related to COPD   6-HTN    Continue with coreg, hydralazine.   7-Type 2 diabetes mellitus with vascular disease  Continue SSI - reasonably controlled at the present time   8-Acute systolic and diastolic heart failure, EF 20-25%  Patient received IV lasix, subsequently torsemide. She was also on nesiritide. Torsemide was on hold due to worsening renal function. Will resume torsemide, I discussed case with Dr Lucien Mons. Resume torsemide 20 mg BID. Patient needs B-met in 48 hour. No ACE due to renal failure. She relates chest pain right side, reproduce by palpation.   9-Chronic kidney disease (CKD), stage III  Due to diuresis-  improve after  diuretics have been decreased. Her Cr on admission was at 2.1. Increase up to 2.6. After stopping torsemide has decrease to 2.5. Will resume torsemide at discharge. Last Cr per record was at 1.2 in 2010. Cr at 2 is probably her new baseline.   10-Hypomagnesemia  Stable . Mg at 1.8. Please repeat Mg in 48 hour and replace as needed.   Patient stable to be discharge.     Discharge Exam: Filed Vitals:   09/16/11 0515  BP: 129/68  Pulse:   Temp:   Resp:    Filed Vitals:   09/15/11 2300 09/15/11 2305 09/16/11 0500 09/16/11 0515  BP:  164/63 129/68 129/68  Pulse: 60 60 60   Temp:   98.1 F (36.7 C)   TempSrc:   Oral   Resp: 20  18   Height:      Weight:   111.755 kg (246 lb 6 oz)   SpO2: 97%  100%    General: No distress. Cardiovascular: S1, S2 RRR.  Respiratory:  CTA  Discharge Instructions  Discharge Orders    Future Orders Please Complete By Expires   Diet - low sodium heart healthy      Increase activity slowly        Medication List  As of 09/16/2011  8:56 AM   STOP taking these medications         amLODipine 10 MG tablet      fenofibrate 145 MG tablet      meclizine 25 MG tablet      OVER THE COUNTER MEDICATION         TAKE these medications         albuterol (5 MG/ML) 0.5% nebulizer solution   Commonly known as: PROVENTIL   Take 0.5 mLs (2.5  mg total) by nebulization 3 (three) times daily.      aspirin 81 MG EC tablet   Take 1 tablet (81 mg total) by mouth daily.      atorvastatin 10 MG tablet   Commonly known as: LIPITOR   Take 1 tablet (10 mg total) by mouth daily at 6 PM.      carvedilol 12.5 MG tablet   Commonly known as: COREG   Take 1 tablet (12.5 mg total) by mouth 2 (two) times daily with a meal.      hydrALAZINE 50 MG tablet   Commonly known as: APRESOLINE   Take 1 tablet (50 mg total) by mouth every 8 (eight) hours.      insulin aspart 100 UNIT/ML injection   Commonly known as: novoLOG   Inject 0-15 Units into the skin 3 (three) times daily with meals.      ipratropium 0.02 % nebulizer solution   Commonly known as: ATROVENT   Take 2.5 mLs (0.5 mg total) by nebulization 3 (three) times daily.      isosorbide mononitrate 60 MG 24 hr tablet   Commonly known as: IMDUR   Take 1 tablet (60 mg total) by mouth daily.      moxifloxacin 400 MG tablet   Commonly known as: AVELOX   Take 1 tablet (400 mg total) by mouth daily at 8 pm.      nicotine 21 mg/24hr patch   Commonly known as: NICODERM CQ - dosed in mg/24 hours   Place 1 patch onto the skin daily.      paricalcitol 1 MCG capsule   Commonly known as: ZEMPLAR   Take 1 mcg by mouth daily.      predniSONE 5 MG tablet   Commonly known as: DELTASONE   Take 4 tablets for 2 days then 2 tablet for 2 day then 1 tablet for 1 day then stop.                      Torsemide 20 mg PO BID.     The results of significant diagnostics from this hospitalization (including imaging, microbiology, ancillary and laboratory) are listed below for reference.    Significant Diagnostic Studies: US Renal  09/07/2011  *RADIOLOGY REPORT*  Clinical Data: Renal insufficiency.  RENAL/URINARY TRACT ULTRASOUND COMPLETE  Comparison:  08/07/2008 CT.  Findings:  Right Kidney:  10.7 cm.  Multiple renal lesions, larger ones which are cysts measuring up to 8.5 cm.  Some of the smaller  structures cannot be confirmed as simple cysts.  Calcifications probably related to vascular calcifications.  No hydronephrosis.  Left Kidney:  11.2 cm.  Cyst measuring up to 4.8 cm.  No hydronephrosis.  Bladder:  No  obvious bladder mass.  IMPRESSION: Bilateral renal cysts as described above.  Some of the smaller structures cannot be confirmed as simple cysts.  No hydronephrosis.  Calcification in the hilar region probably represents vascular calcifications.  Original Report Authenticated By: Fuller Canada, M.D.   Dg Chest Port 1 View  09/10/2011  *RADIOLOGY REPORT*  Clinical Data: Respiratory distress.  PORTABLE CHEST - 1 VIEW  Comparison: Portable chest 09/09/2011.  Findings: The heart is enlarged.  Retrocardiac opacification is stable.  Pulmonary vascular congestion and bilateral pleural effusions remain.  Mild right basilar airspace disease is unchanged.  IMPRESSION:  1.  Stable appearance of retrocardiac opacification concerning for atelectasis or infection. 2.  Persistent bilateral pleural effusions. 3.  Stable cardiomegaly and pulmonary vascular congestion. 4.  Mild atelectasis versus infection at the right lung base is stable.  Original Report Authenticated By: Jamesetta Orleans. MATTERN, M.D.   Dg Chest Port 1 View  09/09/2011  *RADIOLOGY REPORT*  Clinical Data: Acute respiratory failure.  PORTABLE CHEST - 1 VIEW  Comparison: Chest x-ray 09/08/2011.  Findings: Lung volumes are low. There is cephalization of the pulmonary vasculature and slight indistinctness of the interstitial markings suggestive of mild pulmonary edema.  In addition, there are superimposed asymmetric opacities in the right mid and lower lung, and in the left lower lung, concerning for sequela of aspiration or superimposed pneumonia.  Moderate bilateral pleural effusions are noted.  Linear plate-like atelectasis in the left mid lung.  Heart size remains mildly enlarged (unchanged). The patient is rotated to the left on today's exam,  resulting in distortion of the mediastinal contours and reduced diagnostic sensitivity and specificity for mediastinal pathology.  Atherosclerosis in the thoracic aorta.  IMPRESSION: 1. Findings are again suggestive of multilobar pneumonia or sequelae of aspiration. 2.  Superimposed mild pulmonary edema is suspected. 3.  Moderate bilateral pleural effusions (right greater than left) are unchanged. 4.  Atherosclerosis.  Original Report Authenticated By: Florencia Reasons, M.D.   Dg Chest Port 1 View  09/08/2011  *RADIOLOGY REPORT*  Clinical Data: 71 year old female rest for failure.  PORTABLE CHEST - 1 VIEW  Comparison: 09/06/2011 and earlier.  Findings: Portable semi upright AP view 0510 hours. Stable cardiomegaly and mediastinal contours.  Continued dense retrocardiac opacity.  Interval increased streaky and confluent right lung base opacity.  Trace fluid in the right minor fissure. Probable small left effusion.  No pneumothorax.  Stable pulmonary vascularity, no overt edema.  IMPRESSION: 1.  Interval worsening right lung base airspace disease/pneumonia. 2.  Continued retrocardiac pulmonary collapse.  Probable small effusions.  Original Report Authenticated By: Harley Hallmark, M.D.   Dg Chest Portable 1 View  09/06/2011  *RADIOLOGY REPORT*  Clinical Data: Shortness of breath and weakness.  PORTABLE CHEST - 1 VIEW  Comparison: CT chest and chest radiograph 08/07/2008.  Findings: Trachea is midline.  Heart is enlarged.  Thoracic aorta is calcified.  There is bibasilar air space disease with probable bilateral pleural effusions.  Mild interstitial prominence in the upper lung zones.  IMPRESSION: Probable mild congestive heart failure.  Follow-up to clearing is recommended.  Original Report Authenticated By: Reyes Ivan, M.D.    Microbiology: Recent Results (from the past 240 hour(s))  MRSA PCR SCREENING     Status: Normal   Collection Time   09/07/11  6:04 PM      Component Value Range Status  Comment   MRSA by PCR NEGATIVE  NEGATIVE Final      Labs: Basic Metabolic Panel:  Lab 09/16/11 0620 09/15/11 0420 09/14/11 0507 09/13/11 0502 09/12/11 1846 09/11/11 0535  NA 135 137 137 137 136 --  K 4.6 4.1 -- -- -- --  CL 96 97 97 98 98 --  CO2 32 31 33* 28 31 --  GLUCOSE 95 95 105* 127* 171* --  BUN 72* 73* 66* 56* 52* --  CREATININE 2.54* 2.64* 2.61* 2.36* 2.22* --  CALCIUM 8.4 8.2* 8.4 8.5 8.4 --  MG 1.8 -- 1.6 -- -- 1.3*  PHOS -- -- -- -- -- --   Liver Function Tests:  Lab 09/13/11 0502 09/11/11 0535  AST 19 11  ALT 19 6  ALKPHOS 41 44  BILITOT 0.2* 0.3  PROT 4.7* 5.1*  ALBUMIN 1.9* 2.1*   CBC:  Lab 09/13/11 0938 09/13/11 0502 09/11/11 0535 09/10/11 0529  WBC 7.9 8.5 7.2 7.6  NEUTROABS -- -- -- --  HGB 12.4 12.0 11.3* 11.3*  HCT 40.9 39.8 37.0 37.5  MCV 81.8 81.7 79.6 80.8  PLT 177 192 195 180  CBG:  Lab 09/16/11 0719 09/15/11 2106 09/15/11 1628 09/15/11 1132 09/15/11 0719  GLUCAP 97 156* 137* 75 96    Time coordinating discharge: 35 minutes.   SignedHartley Barefoot  [facility] 09/16/2011, 8:56 AM

## 2011-09-16 NOTE — Progress Notes (Signed)
CSW left message with pt daughter to determine bed choice between [facility] and [facility]. CSW will continue to call pt daughter as pt will not make bed offer choice without pt daughter. .Clinical social worker continuing to follow pt to assist with pt dc plans and further csw needs.   Catha Gosselin, Theresia Majors  [phone] .09/16/2011 10:05am

## 2011-09-16 NOTE — Progress Notes (Signed)
.  Clinical social worker assisted with patient discharge to skilled nursing facility, [facility]. .Patient transportation provided by [facility] with patient chart copy. .No further Clinical Social Work needs, signing off.    Catha Gosselin, Theresia Majors  [phone] .09/16/2011 1656pm

## 2011-09-16 NOTE — Progress Notes (Signed)
Nutrition Brief Note  Chart reviewed.  Admitted for CHF, s/p cardiac cath 7/22.  Body mass index is 42.29 kg/(m^2). Pt meets criteria for Obesity Class III based on current BMI.   Current diet order is Heart Healthy, patient is consuming approximately 100% of meals at this time. Labs and medications reviewed.   No nutrition interventions warranted at this time. Please consult RD as needed.  Kirkland Hun, RD, LDN Pager #: [phone] After-Hours Pager #: [phone]

## 2011-09-16 NOTE — Progress Notes (Signed)
Pt discharged to [facility] SNF per MD order. Pt daughter via telephone received all discharge instructions, including heart failure education.  Pt alert and oriented at discharge with no complaints of pain. Pt transported via [facility] to facility. Report called to Olu, RN Efraim Kaufmann

## 2011-09-22 ENCOUNTER — Ambulatory Visit (HOSPITAL_COMMUNITY): Payer: Medicare Other | Attending: Internal Medicine

## 2011-09-28 ENCOUNTER — Emergency Department (HOSPITAL_COMMUNITY): Payer: Medicare Other

## 2011-09-28 ENCOUNTER — Inpatient Hospital Stay (HOSPITAL_COMMUNITY)
Admission: EM | Admit: 2011-09-28 | Discharge: 2011-10-07 | DRG: 291 | Disposition: A | Discharge status: Skilled Nursing Facility | Payer: Medicare Other | Attending: Cardiology | Admitting: Cardiology

## 2011-09-28 ENCOUNTER — Encounter (HOSPITAL_COMMUNITY): Payer: Self-pay | Admitting: *Deleted

## 2011-09-28 DIAGNOSIS — E873 Alkalosis: Secondary | ICD-10-CM | POA: Diagnosis present

## 2011-09-28 DIAGNOSIS — I130 Hypertensive heart and chronic kidney disease with heart failure and stage 1 through stage 4 chronic kidney disease, or unspecified chronic kidney disease: Principal | ICD-10-CM | POA: Diagnosis present

## 2011-09-28 DIAGNOSIS — I5021 Acute systolic (congestive) heart failure: Secondary | ICD-10-CM

## 2011-09-28 DIAGNOSIS — Z9119 Patient's noncompliance with other medical treatment and regimen: Secondary | ICD-10-CM

## 2011-09-28 DIAGNOSIS — Z9981 Dependence on supplemental oxygen: Secondary | ICD-10-CM

## 2011-09-28 DIAGNOSIS — J9620 Acute and chronic respiratory failure, unspecified whether with hypoxia or hypercapnia: Secondary | ICD-10-CM | POA: Diagnosis present

## 2011-09-28 DIAGNOSIS — J4489 Other specified chronic obstructive pulmonary disease: Secondary | ICD-10-CM

## 2011-09-28 DIAGNOSIS — E669 Obesity, unspecified: Secondary | ICD-10-CM

## 2011-09-28 DIAGNOSIS — G4733 Obstructive sleep apnea (adult) (pediatric): Secondary | ICD-10-CM

## 2011-09-28 DIAGNOSIS — I110 Hypertensive heart disease with heart failure: Secondary | ICD-10-CM | POA: Diagnosis present

## 2011-09-28 DIAGNOSIS — E1159 Type 2 diabetes mellitus with other circulatory complications: Secondary | ICD-10-CM

## 2011-09-28 DIAGNOSIS — N184 Chronic kidney disease, stage 4 (severe): Secondary | ICD-10-CM | POA: Diagnosis present

## 2011-09-28 DIAGNOSIS — J189 Pneumonia, unspecified organism: Secondary | ICD-10-CM

## 2011-09-28 DIAGNOSIS — J449 Chronic obstructive pulmonary disease, unspecified: Secondary | ICD-10-CM | POA: Diagnosis present

## 2011-09-28 DIAGNOSIS — I5023 Acute on chronic systolic (congestive) heart failure: Secondary | ICD-10-CM

## 2011-09-28 DIAGNOSIS — Z794 Long term (current) use of insulin: Secondary | ICD-10-CM

## 2011-09-28 DIAGNOSIS — E785 Hyperlipidemia, unspecified: Secondary | ICD-10-CM | POA: Diagnosis present

## 2011-09-28 DIAGNOSIS — Z87891 Personal history of nicotine dependence: Secondary | ICD-10-CM

## 2011-09-28 DIAGNOSIS — Z8673 Personal history of transient ischemic attack (TIA), and cerebral infarction without residual deficits: Secondary | ICD-10-CM

## 2011-09-28 DIAGNOSIS — Z7982 Long term (current) use of aspirin: Secondary | ICD-10-CM

## 2011-09-28 DIAGNOSIS — N179 Acute kidney failure, unspecified: Secondary | ICD-10-CM

## 2011-09-28 DIAGNOSIS — D649 Anemia, unspecified: Secondary | ICD-10-CM

## 2011-09-28 DIAGNOSIS — E119 Type 2 diabetes mellitus without complications: Secondary | ICD-10-CM | POA: Diagnosis present

## 2011-09-28 DIAGNOSIS — Z91199 Patient's noncompliance with other medical treatment and regimen due to unspecified reason: Secondary | ICD-10-CM

## 2011-09-28 DIAGNOSIS — I798 Other disorders of arteries, arterioles and capillaries in diseases classified elsewhere: Secondary | ICD-10-CM

## 2011-09-28 DIAGNOSIS — I509 Heart failure, unspecified: Secondary | ICD-10-CM

## 2011-09-28 DIAGNOSIS — M7989 Other specified soft tissue disorders: Secondary | ICD-10-CM

## 2011-09-28 DIAGNOSIS — Z6841 Body Mass Index (BMI) 40.0 and over, adult: Secondary | ICD-10-CM

## 2011-09-28 LAB — COMPREHENSIVE METABOLIC PANEL
ALT: 19 U/L (ref 0–35)
AST: 15 U/L (ref 0–37)
Albumin: 2.5 g/dL — ABNORMAL LOW (ref 3.5–5.2)
Calcium: 8.9 mg/dL (ref 8.4–10.5)
GFR calc Af Amer: 26 mL/min — ABNORMAL LOW (ref >90)
Sodium: 143 mEq/L (ref 135–145)
Total Protein: 5.8 g/dL — ABNORMAL LOW (ref 6.0–8.3)

## 2011-09-28 LAB — CBC
HCT: 31.4 % — ABNORMAL LOW (ref 36.0–46.0)
HCT: 31.1 % — ABNORMAL LOW (ref 36.0–46.0)
Hemoglobin: 8.9 g/dL — ABNORMAL LOW (ref 12.0–15.0)
Hemoglobin: 8.9 g/dL — ABNORMAL LOW (ref 12.0–15.0)
MCH: 24.6 pg — ABNORMAL LOW (ref 26.0–34.0)
MCHC: 28.3 g/dL — ABNORMAL LOW (ref 30.0–36.0)
MCHC: 28.6 g/dL — ABNORMAL LOW (ref 30.0–36.0)
RBC: 3.64 MIL/uL — ABNORMAL LOW (ref 3.87–5.11)
RDW: 18.5 % — ABNORMAL HIGH (ref 11.5–15.5)
WBC: 8 10*3/uL (ref 4.0–10.5)

## 2011-09-28 LAB — CARDIAC PANEL(CRET KIN+CKTOT+MB+TROPI)
CK, MB: 3.8 ng/mL (ref 0.3–4.0)
Total CK: 42 U/L (ref 7–177)
Troponin I: <0.3 ng/mL (ref <0.30)

## 2011-09-28 LAB — URINE MICROSCOPIC-ADD ON

## 2011-09-28 LAB — MRSA PCR SCREENING: MRSA by PCR: NEGATIVE

## 2011-09-28 LAB — PROCALCITONIN: Procalcitonin: <0.1 ng/mL

## 2011-09-28 LAB — POCT I-STAT TROPONIN I: Troponin i, poc: 0.06 ng/mL (ref 0.00–0.08)

## 2011-09-28 LAB — URINALYSIS, ROUTINE W REFLEX MICROSCOPIC
Glucose, UA: NEGATIVE mg/dL
Hgb urine dipstick: NEGATIVE
Specific Gravity, Urine: 1.009 (ref 1.005–1.030)
Urobilinogen, UA: 1 mg/dL (ref 0.0–1.0)

## 2011-09-28 LAB — CBC WITH DIFFERENTIAL/PLATELET
Basophils Absolute: 0 10*3/uL (ref 0.0–0.1)
Eosinophils Absolute: 0.1 10*3/uL (ref 0.0–0.7)
Eosinophils Relative: 1 % (ref 0–5)
MCH: 24.7 pg — ABNORMAL LOW (ref 26.0–34.0)
MCV: 86.4 fL (ref 78.0–100.0)
Platelets: 109 10*3/uL — ABNORMAL LOW (ref 150–400)
RDW: 18.4 % — ABNORMAL HIGH (ref 11.5–15.5)
WBC: 7.9 10*3/uL (ref 4.0–10.5)

## 2011-09-28 LAB — OCCULT BLOOD, POC DEVICE: Fecal Occult Bld: NEGATIVE

## 2011-09-28 MED ORDER — INSULIN ASPART 100 UNIT/ML ~~LOC~~ SOLN: 3.0000 [IU] | Freq: Three times a day (TID) | SUBCUTANEOUS | Status: DC

## 2011-09-28 MED ORDER — GUAIFENESIN-DM 100-10 MG/5ML PO SYRP: 5.0000 mL | ORAL_SOLUTION | ORAL | Status: DC | PRN

## 2011-09-28 MED ORDER — SODIUM CHLORIDE 0.9 % IJ SOLN
3.0000 mL | Freq: Two times a day (BID) | INTRAMUSCULAR | Status: DC
  Administered 2011-09-28 – 2011-10-05 (×11): 3 mL via INTRAVENOUS

## 2011-09-28 MED ORDER — ALBUTEROL SULFATE (5 MG/ML) 0.5% IN NEBU
5.0000 mg | INHALATION_SOLUTION | Freq: Once | RESPIRATORY_TRACT | Status: AC
  Administered 2011-09-28: 5 mg via RESPIRATORY_TRACT
  Filled 2011-09-28: qty 1

## 2011-09-28 MED ORDER — INSULIN ASPART 100 UNIT/ML ~~LOC~~ SOLN: 0.0000 [IU] | Freq: Three times a day (TID) | SUBCUTANEOUS | Status: DC

## 2011-09-28 MED ORDER — FUROSEMIDE 10 MG/ML IJ SOLN: 40.0000 mg | Freq: Two times a day (BID) | INTRAMUSCULAR | Status: DC

## 2011-09-28 MED ORDER — NESIRITIDE 1.5 MG IV SOLR
0.0100 ug/kg/min | INTRAVENOUS | Status: DC
  Administered 2011-09-28: 0.01 ug/kg/min via INTRAVENOUS
  Filled 2011-09-28: qty 5

## 2011-09-28 MED ORDER — IPRATROPIUM BROMIDE 0.02 % IN SOLN
RESPIRATORY_TRACT | Status: AC
  Filled 2011-09-28: qty 2.5

## 2011-09-28 MED ORDER — ATORVASTATIN CALCIUM 10 MG PO TABS
10.0000 mg | ORAL_TABLET | Freq: Every day | ORAL | Status: DC
  Administered 2011-09-28 – 2011-10-06 (×9): 10 mg via ORAL
  Filled 2011-09-28 (×11): qty 1

## 2011-09-28 MED ORDER — NICOTINE 21 MG/24HR TD PT24
21.0000 mg | MEDICATED_PATCH | Freq: Every day | TRANSDERMAL | Status: DC
  Administered 2011-09-29 – 2011-10-07 (×9): 21 mg via TRANSDERMAL
  Filled 2011-09-28 (×10): qty 1

## 2011-09-28 MED ORDER — ISOSORBIDE MONONITRATE ER 60 MG PO TB24
60.0000 mg | ORAL_TABLET | Freq: Every day | ORAL | Status: DC
  Administered 2011-09-28 – 2011-10-01 (×4): 60 mg via ORAL
  Filled 2011-09-28 (×4): qty 1

## 2011-09-28 MED ORDER — ENOXAPARIN SODIUM 40 MG/0.4ML ~~LOC~~ SOLN: 40.0000 mg | SUBCUTANEOUS | Status: DC

## 2011-09-28 MED ORDER — ALBUTEROL SULFATE (5 MG/ML) 0.5% IN NEBU: 2.5000 mg | INHALATION_SOLUTION | RESPIRATORY_TRACT | Status: DC

## 2011-09-28 MED ORDER — ONDANSETRON HCL 4 MG/2ML IJ SOLN: 4.0000 mg | Freq: Four times a day (QID) | INTRAMUSCULAR | Status: DC | PRN

## 2011-09-28 MED ORDER — CARVEDILOL 12.5 MG PO TABS
12.5000 mg | ORAL_TABLET | Freq: Two times a day (BID) | ORAL | Status: DC
  Administered 2011-09-28 – 2011-10-02 (×9): 12.5 mg via ORAL
  Filled 2011-09-28 (×13): qty 1

## 2011-09-28 MED ORDER — ASPIRIN EC 81 MG PO TBEC
81.0000 mg | DELAYED_RELEASE_TABLET | Freq: Every day | ORAL | Status: DC
  Administered 2011-09-28 – 2011-10-07 (×10): 81 mg via ORAL
  Filled 2011-09-28 (×11): qty 1

## 2011-09-28 MED ORDER — ALBUTEROL SULFATE (5 MG/ML) 0.5% IN NEBU: 2.5000 mg | INHALATION_SOLUTION | RESPIRATORY_TRACT | Status: DC | PRN

## 2011-09-28 MED ORDER — ALUM & MAG HYDROXIDE-SIMETH 200-200-20 MG/5ML PO SUSP: 30.0000 mL | Freq: Four times a day (QID) | ORAL | Status: DC | PRN

## 2011-09-28 MED ORDER — HYDROMORPHONE HCL PF 1 MG/ML IJ SOLN: 0.5000 mg | INTRAMUSCULAR | Status: DC | PRN

## 2011-09-28 MED ORDER — ALBUTEROL SULFATE (5 MG/ML) 0.5% IN NEBU
2.5000 mg | INHALATION_SOLUTION | Freq: Three times a day (TID) | RESPIRATORY_TRACT | Status: DC
  Administered 2011-09-29 – 2011-10-07 (×26): 2.5 mg via RESPIRATORY_TRACT
  Filled 2011-09-28 (×28): qty 0.5

## 2011-09-28 MED ORDER — IPRATROPIUM BROMIDE 0.02 % IN SOLN
0.5000 mg | Freq: Four times a day (QID) | RESPIRATORY_TRACT | Status: DC
  Administered 2011-09-28: 0.5 mg via RESPIRATORY_TRACT

## 2011-09-28 MED ORDER — ACETAMINOPHEN 325 MG PO TABS
650.0000 mg | ORAL_TABLET | Freq: Four times a day (QID) | ORAL | Status: DC | PRN
  Administered 2011-10-02 – 2011-10-06 (×2): 650 mg via ORAL
  Filled 2011-09-28 (×2): qty 2

## 2011-09-28 MED ORDER — DOCUSATE SODIUM 100 MG PO CAPS
100.0000 mg | ORAL_CAPSULE | Freq: Two times a day (BID) | ORAL | Status: DC
  Administered 2011-09-28 – 2011-10-07 (×18): 100 mg via ORAL
  Filled 2011-09-28 (×20): qty 1

## 2011-09-28 MED ORDER — TRAZODONE 25 MG HALF TABLET
25.0000 mg | ORAL_TABLET | Freq: Every evening | ORAL | Status: DC | PRN
  Administered 2011-09-28: 25 mg via ORAL
  Filled 2011-09-28: qty 1

## 2011-09-28 MED ORDER — SODIUM CHLORIDE 0.9 % IV SOLN
INTRAVENOUS | Status: DC
  Administered 2011-09-28: 10 mL/h via INTRAVENOUS

## 2011-09-28 MED ORDER — ACETAMINOPHEN 650 MG RE SUPP: 650.0000 mg | Freq: Four times a day (QID) | RECTAL | Status: DC | PRN

## 2011-09-28 MED ORDER — ALBUTEROL SULFATE (5 MG/ML) 0.5% IN NEBU: 2.5000 mg | INHALATION_SOLUTION | Freq: Three times a day (TID) | RESPIRATORY_TRACT | Status: DC

## 2011-09-28 MED ORDER — HYDRALAZINE HCL 50 MG PO TABS: 50.0000 mg | ORAL_TABLET | Freq: Three times a day (TID) | ORAL | Status: DC

## 2011-09-28 MED ORDER — ONDANSETRON HCL 4 MG PO TABS: 4.0000 mg | ORAL_TABLET | Freq: Four times a day (QID) | ORAL | Status: DC | PRN

## 2011-09-28 MED ORDER — PARICALCITOL 1 MCG PO CAPS
1.0000 ug | ORAL_CAPSULE | Freq: Every day | ORAL | Status: DC
  Administered 2011-09-28 – 2011-10-07 (×10): 1 ug via ORAL
  Filled 2011-09-28 (×10): qty 1

## 2011-09-28 MED ORDER — OXYCODONE HCL 5 MG PO TABS
5.0000 mg | ORAL_TABLET | ORAL | Status: DC | PRN
  Administered 2011-10-01 – 2011-10-06 (×2): 5 mg via ORAL
  Filled 2011-09-28 (×2): qty 1

## 2011-09-28 MED ORDER — HYDRALAZINE HCL 25 MG PO TABS
25.0000 mg | ORAL_TABLET | Freq: Three times a day (TID) | ORAL | Status: DC
  Administered 2011-09-28 – 2011-09-29 (×2): 25 mg via ORAL
  Filled 2011-09-28 (×5): qty 1

## 2011-09-28 MED ORDER — IPRATROPIUM BROMIDE 0.02 % IN SOLN: 0.5000 mg | Freq: Three times a day (TID) | RESPIRATORY_TRACT | Status: DC

## 2011-09-28 MED ORDER — IPRATROPIUM BROMIDE 0.02 % IN SOLN
0.5000 mg | Freq: Three times a day (TID) | RESPIRATORY_TRACT | Status: DC
  Administered 2011-09-29 – 2011-10-07 (×26): 0.5 mg via RESPIRATORY_TRACT
  Filled 2011-09-28 (×28): qty 2.5

## 2011-09-28 MED ORDER — HEPARIN SODIUM (PORCINE) 5000 UNIT/ML IJ SOLN
5000.0000 [IU] | Freq: Three times a day (TID) | INTRAMUSCULAR | Status: DC
  Administered 2011-09-28 – 2011-10-07 (×26): 5000 [IU] via SUBCUTANEOUS
  Filled 2011-09-28 (×30): qty 1

## 2011-09-28 MED ORDER — PIPERACILLIN-TAZOBACTAM 3.375 G IVPB
3.3750 g | Freq: Once | INTRAVENOUS | Status: AC
  Administered 2011-09-28: 3.375 g via INTRAVENOUS
  Filled 2011-09-28: qty 50

## 2011-09-28 MED ORDER — NESIRITIDE 1.5 MG IV SOLR
0.0100 ug/kg/min | INTRAVENOUS | Status: DC
  Filled 2011-09-28: qty 5

## 2011-09-28 MED ORDER — FUROSEMIDE 10 MG/ML IJ SOLN
60.0000 mg | Freq: Three times a day (TID) | INTRAMUSCULAR | Status: DC
  Administered 2011-09-28 – 2011-10-04 (×17): 60 mg via INTRAVENOUS
  Filled 2011-09-28 (×20): qty 6

## 2011-09-28 MED ORDER — VANCOMYCIN HCL 1000 MG IV SOLR
1500.0000 mg | Freq: Once | INTRAVENOUS | Status: AC
  Administered 2011-09-28: 1500 mg via INTRAVENOUS
  Filled 2011-09-28: qty 1500

## 2011-09-28 MED ORDER — ALBUTEROL SULFATE (5 MG/ML) 0.5% IN NEBU: 2.5000 mg | INHALATION_SOLUTION | Freq: Four times a day (QID) | RESPIRATORY_TRACT | Status: DC | PRN

## 2011-09-28 MED ORDER — ONDANSETRON HCL 4 MG/2ML IJ SOLN: 4.0000 mg | Freq: Three times a day (TID) | INTRAMUSCULAR | Status: DC | PRN

## 2011-09-28 MED ORDER — LEVALBUTEROL HCL 0.63 MG/3ML IN NEBU
0.6300 mg | INHALATION_SOLUTION | Freq: Four times a day (QID) | RESPIRATORY_TRACT | Status: DC
  Administered 2011-09-28: 0.63 mg via RESPIRATORY_TRACT
  Filled 2011-09-28 (×3): qty 3

## 2011-09-28 NOTE — ED Notes (Signed)
Family at bedside. 

## 2011-09-28 NOTE — ED Notes (Signed)
Per MD pt can have food.  Pt given [place] sandwich and cheese.

## 2011-09-28 NOTE — ED Notes (Signed)
Report called to [facility]

## 2011-09-28 NOTE — ED Notes (Signed)
Per EMS pt from [facility] with c/o headache this am. Pt recently taken off of meclizine for vertigo and has had headaches since. Pt also c/o intermittant dizziness since being taken off of medication. Also c/o increased fluid/edema-question of CHF. Hx of CHF. Daughter reports pt not eating correct diet at [facility].

## 2011-09-28 NOTE — Consult Note (Signed)
CARDIOLOGY CONSULT NOTE  Patient ID: [name] MRN: [id number] DOB/AGE: 72-09-1940 72 y.o.  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 Patient was recently hospitalized in 7/13 with CHF and COPD exacerbation.  She was diuresed, but this was complicated by worsening of her renal function.  RHC during hospitalization showed low cardiac index (1.9) but also showed that the filling pressures were not significantly elevated after diuresis.  She was discharged to [facility] rehab.  She returns today with increased dyspnea.  She has gradually developed increased lower extremity edema since discharge.  She is short of breath now with any exertion and today was short of breath at rest.  Per her daughter, they have not been weighing her at rehab.  Her diet has been high in sodium.  She denies chest pain, cough or fever.  She is very short of breath lying flat.   Review of systems complete and found to be negative unless listed above in HPI  Past Medical History: 1. COPD on home oxygen. 2. Suspected OSA/OHS, has been on CPAP.   3. HTN 4. H/o CVA 5. CHF: Systolic CHF.  Echo (7/13) with EF 25-30%, mild LV dilation, anteroseptal akinesis, grade III diastolic dysfunction, mild MR, moderately dilated RV with moderately decreased RV systolic function, PA systolic pressure 92 mmHg.  Possible ischemic CMP based on wall motion abnormality but LHC was not done due to significant CKD.  RHC (7/13) with mean RA 7, RV 40/13, PA 45/17, mean PCWP 16 mmHg (after diuresis), CO 4, CI 1.94.  6. Suspected CAD 7. CKD Stage IV 8. Gout 9. Hyperlipidemia 10. Obesity  FH: CAD  History   Social History  . Marital Status: Widowed    Spouse Name: N/A    Number of Children: N/A  . Years of Education: N/A   Occupational History  . Not on file.   Social  History Main Topics  . Smoking status: Current Everyday Smoker -- 1.0 packs/day  . Smokeless tobacco: Not on file  . Alcohol Use: No  . Drug Use: No  . Sexually Active: No   Other Topics Concern  . Not on file   Social History Narrative   Widow,  lives alone      (Not in a hospital admission)  Physical exam Blood pressure 158/67, pulse 66, temperature 97.9 F (36.6 C), temperature source Oral, resp. rate 25, weight 246 lb (111.585 kg), SpO2 96.00%. General: NAD Neck: JVP 14-16 cm, no thyromegaly or thyroid nodule.  Lungs: Decreased breath sounds with bilateral wheezes.  CV: Nondisplaced PMI.  Heart regular S1/S2, no S3/S4, 1/6 HSM LLSB.  2+ edema 1/2 up lower legs.  No carotid bruit.  Unable to feel pulses due to edema.  Abdomen: Soft, nontender, no hepatosplenomegaly, no distention.  Skin: Intact without lesions or rashes.  Neurologic: Alert and oriented x 3.  Psych: Normal affect. Extremities: No clubbing or cyanosis.  HEENT: Normal.   Labs:   Lab Results  Component Value Date   WBC 8.0 09/28/2011   HGB 8.9* 09/28/2011   HCT 31.4* 09/28/2011   MCV 86.3 09/28/2011   PLT 113* 09/28/2011    Lab 09/28/11 1236  NA 143  K 4.8  CL 103  CO2 36*  BUN 60*  CREATININE 2.12*  CALCIUM 8.9  PROT 5.8*  BILITOT 0.2*  ALKPHOS 51  ALT 19  AST 15  GLUCOSE 89  proBNP 27086  Lab Results  Component Value Date   CKTOTAL 44 09/07/2011   CKMB 3.1 09/07/2011   TROPONINI <0.30 09/07/2011    Lab Results  Component Value Date   CHOL 255* 09/07/2011   CHOL  Value: 151        ATP III CLASSIFICATION:  <200     mg/dL   Desirable  161-096  mg/dL   Borderline High  >=045    mg/dL   High        05/31/8117   CHOL  Value: 167        ATP III CLASSIFICATION:  <200     mg/dL   Desirable  147-829  mg/dL   Borderline High  >=562    mg/dL   High        03/24/8655   Lab Results  Component Value Date   HDL 59 09/07/2011   HDL 34* 08/08/2008   HDL 27* 05/02/2008   Lab Results  Component Value Date    LDLCALC 161* 09/07/2011   LDLCALC  Value: 84        Total Cholesterol/HDL:CHD Risk Coronary Heart Disease Risk Table                     Men   Women  1/2 Average Risk   3.4   3.3  Average Risk       5.0   4.4  2 X Average Risk   9.6   7.1  3 X Average Risk  23.4   11.0        Use the calculated Patient Ratio above and the CHD Risk Table to determine the patient's CHD Risk.        ATP III CLASSIFICATION (LDL):  <100     mg/dL   Optimal  846-962  mg/dL   Near or Above                    Optimal  130-159  mg/dL   Borderline  952-841  mg/dL   High  >324     mg/dL   Very High 05/24/270   LDLCALC  Value: 110        Total Cholesterol/HDL:CHD Risk Coronary Heart Disease Risk Table                     Men   Women  1/2 Average Risk   3.4   3.3  Average Risk       5.0   4.4  2 X Average Risk   9.6   7.1  3 X Average Risk  23.4   11.0        Use the calculated Patient Ratio above and the CHD Risk Table to determine the patient's CHD Risk.        ATP III CLASSIFICATION (LDL):  <100     mg/dL   Optimal  536-644  mg/dL   Near or Above                    Optimal  130-159  mg/dL   Borderline  034-742  mg/dL   High  >595     mg/dL   Very High* 6/38/7564   Lab Results  Component Value Date   TRIG 177* 09/07/2011   TRIG 167* 08/08/2008   TRIG 148 05/02/2008   Lab Results  Component Value Date   CHOLHDL 4.3 09/07/2011   CHOLHDL  4.4 08/08/2008   CHOLHDL 6.2 05/02/2008   No results found for this basename: LDLDIRECT      Radiology: CXR with slight vascular congestion, LLL opacity (? PNA versus atelectasis) EKG: None available Telemetry: NSR  ASSESSMENT AND PLAN:  Complicated 71 yo with chronic systolic CHF, COPD, suspected OHS/OSA, and stage IV CKD presents with shortness of breath. 1. CHF: She is certainly volume overloaded on exam, with acute on chronic systolic CHF.  EF 25-30% on last echo.  Diuresis in the past has been complicated by cardiorenal syndrome with ARF.  Cardiac index was low (1.94) on RHC at last  hospitalization.  I do not want to start dopamine or milrinone as she is hypertensive.  I will try her on a nesiritide gtt for afterload reduction while we are diuresing her, in hope that improved cardiac output will help lessen renal effect.  I will start her on Lasix 60 mg IV every 8 hours.  I will cut back on hydralazine while she is on nesiritide.  Continue same dose of Coreg and Imdur for now.  Place foley, follow weights and I/Os.  2. CKD: Creatinine improved from last admission.  Management of her CHF is significantly effected by cardiorenal syndrome.  Will need to follow creatinine closely with diuresis.  3. ID: LLL opacity.  More likely atelectasis given lack of fever and normal WBCs, but being covered empirically for HCAP.  Will check procalcitonin.  4. Pulmonary: She is on home oxygen and has diagnoses of COPD and OHS/OSA.  She had significantly elevated PA pressure by echo last month, but after diuresis, PA pressure on RHC was only mildly elevated. Continue CPAP at night.   [name] 09/28/2011 7:16 PM

## 2011-09-28 NOTE — H&P (Signed)
History and Physical  [name] ZOX:[id number] DOB: Jul 09, 1940 DOA: 09/28/2011  Referring physician: PCP: Gwynneth Aliment, MD   Chief Complaint: Increasing shortness of breath  HPI:  [name] with past medical history significant for multiple medical problems including COPD, diabetes, congestive heart failure, and hypertension. Patient was recently discharged from the hospital 10 days ago with CHF. Patient was discharged to [facility] for rehabilitation. Patient stated that since discharge she noticed that the swelling in her legs have been increasing slowly. She also noted increased shortness of breath. She has no fevers no chills. She has some pain in her chest but that's been chronic. She's had no nausea or vomiting.  In the emergency room she was noted to have elevated BNP and hemoglobin decreased from previous hospitalization. We were asked to admit for further evaluation.  Chart Review: Chart from previous hospitalization reviewed  eview of Systems:  10 point review of systems negative otherwise stated in the history of present illness  Past Medical History  Diagnosis Date  . COPD (chronic obstructive pulmonary disease)     on home o2  . Chronic renal insufficiency   . Diabetes mellitus     type 2  . CVA (cerebral infarction)     hx  . Hypertensive heart disease with congestive heart failure   . Chronic cor pulmonale   . Cardiomyopathy of undetermined type 09/07/2011  . Chronic kidney disease (CKD), stage IV (severe)   . Hyperlipdemia   . Obesity (BMI 30-39.9)   . Sleep apnea   . History of gout 09/07/2011    Past Surgical History  Procedure Date  . Bilateral oophorectomy     Social History:   Page patient's quit smoking after her last hospitalization. Prior to that she  smoked a pack a day. She does not drink alcohol, she does not use illicit drugs.  She is widowed with 5 children she is retired she was living alone prior to going to  [place] for rehabilitation. Allergies  Allergen Reactions  . Sulfonamide Derivatives Hives and Rash   Family history Both parents are deceased. Mother died from stroke father died from heart   Prior to Admission medications   Medication Sig Start Date End Date Taking? Authorizing Provider  albuterol (PROVENTIL) (5 MG/ML) 0.5% nebulizer solution Take 0.5 mLs (2.5 mg total) by nebulization 3 (three) times daily. 09/16/11 09/15/12 Yes Belkys A Regalado, MD  aspirin EC 81 MG tablet Take 81 mg by mouth daily.   Yes Historical Provider, MD  atorvastatin (LIPITOR) 10 MG tablet Take 10 mg by mouth every evening.    Yes Historical Provider, MD  carvedilol (COREG) 12.5 MG tablet Take 12.5 mg by mouth 2 (two) times daily with a meal.   Yes Historical Provider, MD  hydrALAZINE (APRESOLINE) 50 MG tablet Take 1 tablet (50 mg total) by mouth every 8 (eight) hours. 09/16/11 09/15/12 Yes Belkys A Regalado, MD  insulin aspart (NOVOLOG) 100 UNIT/ML injection Inject 0-15 Units into the skin 3 (three) times daily with meals. 09/16/11 09/15/12 Yes Belkys A Regalado, MD  ipratropium (ATROVENT) 0.02 % nebulizer solution Take 2.5 mLs (0.5 mg total) by nebulization 3 (three) times daily. 09/16/11 09/15/12 Yes Belkys A Regalado, MD  isosorbide mononitrate (IMDUR) 60 MG 24 hr tablet Take 1 tablet (60 mg total) by mouth daily. 09/16/11 09/15/12 Yes Belkys A Regalado, MD  nicotine (NICODERM CQ - DOSED IN MG/24 HOURS) 21 mg/24hr patch Place 1 patch onto the skin daily. 09/16/11 10/16/11  Yes Belkys A Regalado, MD  paricalcitol (ZEMPLAR) 1 MCG capsule Take 1 mcg by mouth daily.   Yes Historical Provider, MD  predniSONE (DELTASONE) 5 MG tablet Take 5-20 mg by mouth daily.   Yes Historical Provider, MD  torsemide (DEMADEX) 20 MG tablet Take 1 tablet (20 mg total) by mouth 2 (two) times daily. 09/16/11 09/15/12 Yes Alba Cory, MD   Physical Exam: Filed Vitals:   09/28/11 1202 09/28/11 1358 09/28/11 1431 09/28/11 1550  BP: 141/53   160/57 154/58  Pulse: 62  66   Temp: 98.4 F (36.9 C)  97.9 F (36.6 C)   TempSrc: Oral  Oral   Resp: 16  16 32  Weight:    111.585 kg (246 lb)  SpO2: 96% 97% 93% 92%     General:   patient is laying on the stretcher doesn't seem to be in any acute distress.   HEENT head is normocephalic atraumatic pupils reactive to light throat without erythema   Cardiovascular regular rate rhythmRespiratory:   Abdomen:  soft obese positive bowel sounds nontender nondistended   Skin:  No lesions noted   extremities 3+ edema right lower extremity 2+ left lower extremity  Psychiatric:  normal mood   Neurologic:nonfocal  Wt Readings from Last 3 Encounters:  09/28/11 111.585 kg (246 lb)  09/16/11 111.755 kg (246 lb 6 oz)  09/16/11 111.755 kg (246 lb 6 oz)    Labs on Admission:  Basic Metabolic Panel:  Lab 09/28/11 0454  NA 143  K 4.8  CL 103  CO2 36*  GLUCOSE 89  BUN 60*  CREATININE 2.12*  CALCIUM 8.9  MG --  PHOS --    Liver Function Tests:  Lab 09/28/11 1236  AST 15  ALT 19  ALKPHOS 51  BILITOT 0.2*  PROT 5.8*  ALBUMIN 2.5*    CBC:  Lab 09/28/11 1236  WBC 7.9  NEUTROABS 6.4  HGB 9.1*  HCT 31.9*  MCV 86.4  PLT 109*    Troponin (Point of Care Test)  Basename 09/28/11 1258  TROPIPOC 0.06    BNP (last 3 results)  Basename 09/28/11 1237 09/08/11 0350 09/06/11 0740  PROBNP 27086.0* 34331.0* 49670.0*    CBG:  Lab 09/28/11 1439  GLUCAP 76     Radiological Exams on Admission: Dg Chest 2 View  09/28/2011  *RADIOLOGY REPORT*  Clinical Data: Chest pain.  Shortness of breath.  CHEST - 2 VIEW  Comparison: 09/10/2011 and 08/07/2008  Findings: Chronic cardiomegaly.  Slight pulmonary vascular congestion.  Slight scarring in the left midzone.  Abnormal increased density at the left base posterior laterally which could represent an area of infiltrate or loculated fluid or atelectasis secondary to the enlarged heart.  No definitive effusions.  No acute osseous  abnormality.  Moderate arthritis of the left glenohumeral joint.  IMPRESSION:  1.  Abnormal increased density at the left base posterior laterally, increased since the prior exam.  This could represent an area of infiltrate or effusion or atelectasis. 2.  Chronic cardiomegaly. 3.  Slight pulmonary vascular prominence.  Original Report Authenticated By: Gwynn Burly, M.D.   Ct Head Wo Contrast  09/28/2011  *RADIOLOGY REPORT*  Clinical Data: Headache  CT HEAD WITHOUT CONTRAST  Technique:  Contiguous axial images were obtained from the base of the skull through the vertex without contrast.  Comparison: Head CT of 05/01/2008  Findings: No acute intracranial hemorrhage.  No focal mass lesion. No CT evidence of acute infarction.   No midline shift  or mass effect.  No hydrocephalus.  Basilar cisterns are patent.  There is a prominent CSF space anterior the frontal lobes unchanged from prior.  There is mild periventricular white matter hypodensities.  There is new fluid within the left mastoid air cells compared to prior.  Paranasal sinuses are clear.  IMPRESSION:  1.  No acute intracranial findings. 2.  New fluid in the left mastoid air cells.  Original Report Authenticated By: Genevive Bi, M.D.    EKG: Independently reviewed. Ordered EKG   Assessment/Plan  Hypertensive heart disease with congestive heart failure Patient presented with shortness of breath. BNP from last hospitalization has not increased. Her weight is the same. She does have swelling in the lower extremity. Will start her on IV Lasix 40 every 12. Cardiology consulted.  Diabetes Place her on sliding scale insulin   C O P D Patient has stopped smoking. Placed her on neb treatments. Continue her home O2 at 2 L   Anemia Patient's hemoglobin has dropped by 2 g. Will guaiac stools and recheck hemoglobin. Patient has not noted any dark stools.  Chronic renal insufficiency Creatinine has not increased since last hospitalization. Will  monitor creatinine as diuresis progresses  Question of healthcare associated pneumonia Patient received one dose of Zosyn and Vanco the emergency room. Chest x-ray showed question of infiltrate. Patient does not have any cough no white count no fever. This is most likely fluid versus atelectasis. Will monitor symptoms for now.  Sleep apnea Patient is noncompliant with CPAP machine. Discuss with patient the importance of using CPAP machine.   Code Status: Full Family Communication: Discussed with daughter Time spent:70 minutes  Adline Potter MD  [facility] Pager [phone] If 7PM-7AM, please contact floor/night-coverage at [email or web address], password [facility] 09/28/2011, 5:52 PM

## 2011-09-28 NOTE — Progress Notes (Signed)
Disposition note  [name], is a 71 y.o. female,   MRN: [id number]  -  DOB - 04-03-1940  Outpatient Primary MD for the patient is SANDERS,ROBYN N, MD   Blood pressure 154/58, pulse 66, temperature 97.9 F (36.6 C), temperature source Oral, resp. rate 32, weight 111.585 kg (246 lb), SpO2 92.00%.  Active Problems:  Hypertensive heart disease with congestive heart failure  C O P D  Anemia   71 year old female presents with worsening shortness of breath over the past month and new onset anemia.  [name] was recently admitted with CHF/COPD exacerbation.  She has and EF of 25 - 30% is known to the CHF service.  BNP today is 27,000+.  CXR is poor but appears to show LLL PNA.  Per EDP she is wheezing in all lung fields and has bilateral pitting edema.  Her stool is brown on exam.  Guiac is pending.  Her hgb has dropped from 12.4 to 9.1 over the past month.  I have consulted [facility] for CHF exacerbation.  Algis Downs, PA-C [facility] Pager: [phone]

## 2011-09-28 NOTE — ED Provider Notes (Signed)
History     CSN: [id number]  Arrival date & time 09/28/11  1142   First MD Initiated Contact with Patient 09/28/11 1217      Chief Complaint  Patient presents with  . Headache    (Consider location/radiation/quality/duration/timing/severity/associated sxs/prior treatment) Patient is a 71 y.o. female presenting with shortness of breath and headaches. The history is provided by the patient.  Shortness of Breath  The current episode started 5 to 7 days ago. The problem occurs frequently. The problem has been gradually worsening. The problem is moderate. Nothing relieves the symptoms. The symptoms are aggravated by activity and a supine position. Associated symptoms include orthopnea, cough, shortness of breath and wheezing. Pertinent negatives include no chest pain, no chest pressure and no fever. Associated symptoms comments: B/L LE edema. She has had prior hospitalizations. Her past medical history is significant for past wheezing. Past medical history comments: COPD, CHF.  Headache  This is a new problem. The current episode started more than 1 week ago. The problem occurs hourly. The problem has not changed since onset.Associated with: Pt has had intermittent mild frontal headache since being taken off meclizine. The pain is located in the frontal region. The pain is mild. The pain does not radiate. Associated symptoms include orthopnea and shortness of breath. Pertinent negatives include no fever, no chest pressure, no palpitations, no syncope, no nausea and no vomiting. She has tried nothing for the symptoms.    Past Medical History  Diagnosis Date  . COPD (chronic obstructive pulmonary disease)     on home o2  . Chronic renal insufficiency   . Diabetes mellitus     type 2  . CVA (cerebral infarction)     hx  . Hypertensive heart disease with congestive heart failure   . Chronic cor pulmonale   . Cardiomyopathy of undetermined type 09/07/2011  . Chronic kidney disease (CKD), stage  IV (severe)   . Hyperlipdemia   . Obesity (BMI 30-39.9)   . Sleep apnea   . History of gout 09/07/2011    Past Surgical History  Procedure Date  . Bilateral oophorectomy     No family history on file.  History  Substance Use Topics  . Smoking status: Current Everyday Smoker -- 1.0 packs/day  . Smokeless tobacco: Not on file  . Alcohol Use: No    OB History    Grav Para Term Preterm Abortions TAB SAB Ect Mult Living                  Review of Systems  Constitutional: Negative for fever and chills.  Respiratory: Positive for cough, shortness of breath and wheezing. Negative for chest tightness.   Cardiovascular: Positive for orthopnea and leg swelling. Negative for chest pain, palpitations and syncope.  Gastrointestinal: Negative for nausea, vomiting, abdominal pain and diarrhea.  Genitourinary: Negative.   Neurological: Positive for headaches. Negative for dizziness, syncope and light-headedness.  All other systems reviewed and are negative.    Allergies  Sulfonamide derivatives  Home Medications   Current Outpatient Rx  Name Route Sig Dispense Refill  . ALBUTEROL SULFATE (5 MG/ML) 0.5% IN NEBU Nebulization Take 0.5 mLs (2.5 mg total) by nebulization 3 (three) times daily. 20 mL 0  . ASPIRIN EC 81 MG PO TBEC Oral Take 81 mg by mouth daily.    . ATORVASTATIN CALCIUM 10 MG PO TABS Oral Take 10 mg by mouth every evening.     Marland Kitchen CARVEDILOL 12.5 MG PO TABS Oral Take 12.5  mg by mouth 2 (two) times daily with a meal.    . HYDRALAZINE HCL 50 MG PO TABS Oral Take 1 tablet (50 mg total) by mouth every 8 (eight) hours. 90 tablet 0  . INSULIN ASPART 100 UNIT/ML Jerome SOLN Subcutaneous Inject 0-15 Units into the skin 3 (three) times daily with meals. 1 vial 0  . IPRATROPIUM BROMIDE 0.02 % IN SOLN Nebulization Take 2.5 mLs (0.5 mg total) by nebulization 3 (three) times daily. 75 mL 0  . ISOSORBIDE MONONITRATE ER 60 MG PO TB24 Oral Take 1 tablet (60 mg total) by mouth daily. 30 tablet  0  . NICOTINE 21 MG/24HR TD PT24 Transdermal Place 1 patch onto the skin daily. 28 patch 0  . PARICALCITOL 1 MCG PO CAPS Oral Take 1 mcg by mouth daily.    Marland Kitchen PREDNISONE 5 MG PO TABS Oral Take 5-20 mg by mouth daily.    . TORSEMIDE 20 MG PO TABS Oral Take 1 tablet (20 mg total) by mouth 2 (two) times daily. 60 tablet 0    BP 154/58  Pulse 66  Temp 97.9 F (36.6 C) (Oral)  Resp 32  Wt 246 lb (111.585 kg)  SpO2 92%  Physical Exam  Nursing note and vitals reviewed. Constitutional: She is oriented to person, place, and time. She appears well-developed and well-nourished. No distress.  HENT:  Head: Normocephalic and atraumatic.  Eyes: Conjunctivae are normal.  Neck: Neck supple.  Cardiovascular: Normal rate, regular rhythm and intact distal pulses.   Murmur heard.  Systolic murmur is present with a grade of 3/6  Pulmonary/Chest: Effort normal. No respiratory distress. She has wheezes (all fields). She has no rales.  Abdominal: Soft. She exhibits no distension. There is no tenderness.  Musculoskeletal: Normal range of motion. She exhibits edema (2+ pitting b/l). She exhibits no tenderness.  Neurological: She is alert and oriented to person, place, and time. She has normal strength. No cranial nerve deficit or sensory deficit.  Skin: Skin is warm and dry. No cyanosis. Nails show clubbing.    ED Course  Procedures (including critical care time)  Labs Reviewed  CBC WITH DIFFERENTIAL - Abnormal; Notable for the following:    RBC 3.69 (*)     Hemoglobin 9.1 (*)     HCT 31.9 (*)     MCH 24.7 (*)     MCHC 28.5 (*)     RDW 18.4 (*)     Platelets 109 (*)  PLATELET COUNT CONFIRMED BY SMEAR   Neutrophils Relative 81 (*)     All other components within normal limits  COMPREHENSIVE METABOLIC PANEL - Abnormal; Notable for the following:    CO2 36 (*)     BUN 60 (*)     Creatinine, Ser 2.12 (*)     Total Protein 5.8 (*)     Albumin 2.5 (*)     Total Bilirubin 0.2 (*)     GFR calc non  Af Amer 22 (*)     GFR calc Af Amer 26 (*)     All other components within normal limits  PRO B NATRIURETIC PEPTIDE - Abnormal; Notable for the following:    Pro B Natriuretic peptide (BNP) 27086.0 (*)     All other components within normal limits  POCT I-STAT TROPONIN I  GLUCOSE, CAPILLARY  URINALYSIS, ROUTINE W REFLEX MICROSCOPIC   Dg Chest 2 View  09/28/2011  *RADIOLOGY REPORT*  Clinical Data: Chest pain.  Shortness of breath.  CHEST - 2 VIEW  Comparison: 09/10/2011 and 08/07/2008  Findings: Chronic cardiomegaly.  Slight pulmonary vascular congestion.  Slight scarring in the left midzone.  Abnormal increased density at the left base posterior laterally which could represent an area of infiltrate or loculated fluid or atelectasis secondary to the enlarged heart.  No definitive effusions.  No acute osseous abnormality.  Moderate arthritis of the left glenohumeral joint.  IMPRESSION:  1.  Abnormal increased density at the left base posterior laterally, increased since the prior exam.  This could represent an area of infiltrate or effusion or atelectasis. 2.  Chronic cardiomegaly. 3.  Slight pulmonary vascular prominence.  Original Report Authenticated By: Gwynn Burly, M.D.   Ct Head Wo Contrast  09/28/2011  *RADIOLOGY REPORT*  Clinical Data: Headache  CT HEAD WITHOUT CONTRAST  Technique:  Contiguous axial images were obtained from the base of the skull through the vertex without contrast.  Comparison: Head CT of 05/01/2008  Findings: No acute intracranial hemorrhage.  No focal mass lesion. No CT evidence of acute infarction.   No midline shift or mass effect.  No hydrocephalus.  Basilar cisterns are patent.  There is a prominent CSF space anterior the frontal lobes unchanged from prior.  There is mild periventricular white matter hypodensities.  There is new fluid within the left mastoid air cells compared to prior.  Paranasal sinuses are clear.  IMPRESSION:  1.  No acute intracranial findings. 2.   New fluid in the left mastoid air cells.  Original Report Authenticated By: Genevive Bi, M.D.     1. Congestive heart failure   2. Pneumonia   3. Anemia       MDM  71 yo female with extensive PMHx including CHF, COPD baseline O2 2L [place], DM, HTN presents for worsening shortness of breath and headache.  Daughter reports that over the past few weeks the pt has had worsening b/l LE edema, has been complaining of worsening shortness of breath, and has been more tired.  No chest pain or pressure.  She has also had a mild frontal headache and episodes of vertigo since being taken off meclizine.  Reports vertigo was well controlled prior to DC of meclizine.  No focal neuro deficits.  AF, VSS on baseline home O2 of 2L.  Physical exam with diffuse wheezes in all fields.  2+ pitting edema to the knees b/l.  Nml neuro exam.  Presentation concerning for CHF exacerbation, COPD exacerbation, pneumonia.  Will get labs including cardiac enzymes and CXR.  Will give neb treatment for wheezing.  Will also get CT head with history of headache and vertigo.  CT head with no acute changes.  BNP elevated at [address].  CBC with Hgb of 9.1 down from 12.4 1 month ago.  Will send hemoccult.  CXR with new infiltrate in the left posterior base.  Will give Vanc and Zosyn for HCAP.  Will admit to Hospitalist service for CHF exacerbation, further treatment of HCAP, and anemia work-up.        Cherre Robins, MD 09/28/11 [phone]

## 2011-09-29 DIAGNOSIS — I110 Hypertensive heart disease with heart failure: Secondary | ICD-10-CM

## 2011-09-29 DIAGNOSIS — N179 Acute kidney failure, unspecified: Secondary | ICD-10-CM

## 2011-09-29 DIAGNOSIS — M7989 Other specified soft tissue disorders: Secondary | ICD-10-CM

## 2011-09-29 DIAGNOSIS — N184 Chronic kidney disease, stage 4 (severe): Secondary | ICD-10-CM

## 2011-09-29 DIAGNOSIS — I5021 Acute systolic (congestive) heart failure: Secondary | ICD-10-CM

## 2011-09-29 DIAGNOSIS — I509 Heart failure, unspecified: Secondary | ICD-10-CM

## 2011-09-29 LAB — CBC
HCT: 32.2 % — ABNORMAL LOW (ref 36.0–46.0)
Hemoglobin: 9 g/dL — ABNORMAL LOW (ref 12.0–15.0)
MCH: 24.3 pg — ABNORMAL LOW (ref 26.0–34.0)
MCV: 86.8 fL (ref 78.0–100.0)
RBC: 3.71 MIL/uL — ABNORMAL LOW (ref 3.87–5.11)

## 2011-09-29 LAB — BASIC METABOLIC PANEL
CO2: 37 mEq/L — ABNORMAL HIGH (ref 19–32)
Calcium: 8.8 mg/dL (ref 8.4–10.5)
Creatinine, Ser: 2.09 mg/dL — ABNORMAL HIGH (ref 0.50–1.10)
GFR calc non Af Amer: 23 mL/min — ABNORMAL LOW (ref >90)
Glucose, Bld: 101 mg/dL — ABNORMAL HIGH (ref 70–99)
Sodium: 144 mEq/L (ref 135–145)

## 2011-09-29 LAB — GLUCOSE, CAPILLARY
Glucose-Capillary: 96 mg/dL (ref 70–99)
Glucose-Capillary: 141 mg/dL — ABNORMAL HIGH (ref 70–99)

## 2011-09-29 LAB — CARDIAC PANEL(CRET KIN+CKTOT+MB+TROPI)
CK, MB: 3.8 ng/mL (ref 0.3–4.0)
CK, MB: 3.7 ng/mL (ref 0.3–4.0)
Relative Index: INVALID (ref 0.0–2.5)
Total CK: 39 U/L (ref 7–177)

## 2011-09-29 MED ORDER — NESIRITIDE 1.5 MG IV SOLR
0.0100 ug/kg/min | INTRAVENOUS | Status: DC
  Administered 2011-09-29 – 2011-10-04 (×5): 0.01 ug/kg/min via INTRAVENOUS
  Filled 2011-09-29 (×7): qty 5

## 2011-09-29 MED ORDER — INSULIN ASPART 100 UNIT/ML ~~LOC~~ SOLN
0.0000 [IU] | Freq: Three times a day (TID) | SUBCUTANEOUS | Status: DC
  Administered 2011-09-29 – 2011-10-06 (×3): 1 [IU] via SUBCUTANEOUS

## 2011-09-29 MED ORDER — HYDRALAZINE HCL 50 MG PO TABS
50.0000 mg | ORAL_TABLET | Freq: Three times a day (TID) | ORAL | Status: DC
  Administered 2011-09-29 – 2011-10-01 (×6): 50 mg via ORAL
  Filled 2011-09-29 (×9): qty 1

## 2011-09-29 NOTE — Progress Notes (Signed)
Subjective: Breathing better but short of breath.  Objective: Vital signs in last 24 hours: Filed Vitals:   09/29/11 0630 09/29/11 0700 09/29/11 0807 09/29/11 0830  BP: 164/64 166/63 162/72   Pulse: 70 67 67   Temp:   98 F (36.7 C)   TempSrc:   Oral   Resp: 29 24 33   Height:      Weight:      SpO2: 100% 100% 99% 99%   Weight change:   Intake/Output Summary (Last 24 hours) at 09/29/11 0935 Last data filed at 09/29/11 0900  Gross per 24 hour  Intake 601.42 ml  Output   1805 ml  Net -1203.58 ml    Physical Exam: General: Awake, Oriented, No acute distress. HEENT: EOMI. Neck: Supple CV: S1 and S2 Lungs: Crackles at bases bilaterally, diminished breath sounds at bases. Abdomen: Soft, Nontender, Nondistended, +bowel sounds. Ext: Good pulses. Trace edema.  Lab Results: Basic Metabolic Panel:  Lab 09/29/11 1610 09/28/11 2019 09/28/11 1236  NA 144 -- 143  K 4.9 -- 4.8  CL 102 -- 103  CO2 37* -- 36*  GLUCOSE 101* -- 89  BUN 55* -- 60*  CREATININE 2.09* 2.06* 2.12*  CALCIUM 8.8 -- 8.9  MG -- -- --  PHOS -- -- --   Liver Function Tests:  Lab 09/28/11 1236  AST 15  ALT 19  ALKPHOS 51  BILITOT 0.2*  PROT 5.8*  ALBUMIN 2.5*   No results found for this basename: LIPASE:5,AMYLASE:5 in the last 168 hours No results found for this basename: AMMONIA:5 in the last 168 hours CBC:  Lab 09/29/11 0415 09/28/11 2019 09/28/11 1811 09/28/11 1236  WBC 8.2 8.4 8.0 7.9  NEUTROABS -- -- -- 6.4  HGB 9.0* 8.9* 8.9* 9.1*  HCT 32.2* 31.1* 31.4* 31.9*  MCV 86.8 85.9 86.3 86.4  PLT 119* 113* 113* 109*   Cardiac Enzymes:  Lab 09/29/11 0415 09/28/11 2019  CKTOTAL 42 42  CKMB 3.8 3.8  CKMBINDEX -- --  TROPONINI <0.30 <0.30   BNP (last 3 results)  Basename 09/28/11 1237 09/08/11 0350 09/06/11 0740  PROBNP 27086.0* 34331.0* 49670.0*   CBG:  Lab 09/29/11 0808 09/28/11 2137 09/28/11 2026 09/28/11 1439  GLUCAP 96 97 94 76   No results found for this basename: HGBA1C:5  in the last 72 hours Other Labs: No components found with this basename: POCBNP:3 No results found for this basename: DDIMER:2 in the last 168 hours No results found for this basename: CHOL:2,HDL:2,LDLCALC:2,TRIG:2,CHOLHDL:2,LDLDIRECT:2 in the last 168 hours No results found for this basename: TSH,T4TOTAL,FREET3,T3FREE,FREET4,THYROIDAB in the last 168 hours No results found for this basename: VITAMINB12:2,FOLATE:2,FERRITIN:2,TIBC:2,IRON:2,RETICCTPCT:2 in the last 168 hours  Micro Results: Recent Results (from the past 240 hour(s))  MRSA PCR SCREENING     Status: Normal   Collection Time   09/28/11  7:58 PM      Component Value Range Status Comment   MRSA by PCR NEGATIVE  NEGATIVE Final     Studies/Results: Dg Chest 2 View  09/28/2011  *RADIOLOGY REPORT*  Clinical Data: Chest pain.  Shortness of breath.  CHEST - 2 VIEW  Comparison: 09/10/2011 and 08/07/2008  Findings: Chronic cardiomegaly.  Slight pulmonary vascular congestion.  Slight scarring in the left midzone.  Abnormal increased density at the left base posterior laterally which could represent an area of infiltrate or loculated fluid or atelectasis secondary to the enlarged heart.  No definitive effusions.  No acute osseous abnormality.  Moderate arthritis of the left glenohumeral joint.  IMPRESSION:  1.  Abnormal increased density at the left base posterior laterally, increased since the prior exam.  This could represent an area of infiltrate or effusion or atelectasis. 2.  Chronic cardiomegaly. 3.  Slight pulmonary vascular prominence.  Original Report Authenticated By: Gwynn Burly, M.D.   Ct Head Wo Contrast  09/28/2011  *RADIOLOGY REPORT*  Clinical Data: Headache  CT HEAD WITHOUT CONTRAST  Technique:  Contiguous axial images were obtained from the base of the skull through the vertex without contrast.  Comparison: Head CT of 05/01/2008  Findings: No acute intracranial hemorrhage.  No focal mass lesion. No CT evidence of acute  infarction.   No midline shift or mass effect.  No hydrocephalus.  Basilar cisterns are patent.  There is a prominent CSF space anterior the frontal lobes unchanged from prior.  There is mild periventricular white matter hypodensities.  There is new fluid within the left mastoid air cells compared to prior.  Paranasal sinuses are clear.  IMPRESSION:  1.  No acute intracranial findings. 2.  New fluid in the left mastoid air cells.  Original Report Authenticated By: Genevive Bi, M.D.    Medications: I have reviewed the patient's current medications. Scheduled Meds:   . albuterol  2.5 mg Nebulization TID  . albuterol  5 mg Nebulization Once  . aspirin EC  81 mg Oral Daily  . atorvastatin  10 mg Oral q1800  . carvedilol  12.5 mg Oral BID WC  . docusate sodium  100 mg Oral BID  . furosemide  60 mg Intravenous Q8H  . heparin subcutaneous  5,000 Units Subcutaneous Q8H  . hydrALAZINE  50 mg Oral Q8H  . insulin aspart  0-15 Units Subcutaneous TID WC  . ipratropium      . ipratropium  0.5 mg Nebulization TID  . isosorbide mononitrate  60 mg Oral Daily  . nicotine  21 mg Transdermal Daily  . paricalcitol  1 mcg Oral Daily  . piperacillin-tazobactam (ZOSYN)  IV  3.375 g Intravenous Once  . sodium chloride  3 mL Intravenous Q12H  . vancomycin  1,500 mg Intravenous Once  . DISCONTD: albuterol  2.5 mg Nebulization Q4H  . DISCONTD: albuterol  2.5 mg Nebulization TID  . DISCONTD: enoxaparin (LOVENOX) injection  40 mg Subcutaneous Q24H  . DISCONTD: furosemide  40 mg Intravenous Q12H  . DISCONTD: hydrALAZINE  25 mg Oral Q8H  . DISCONTD: hydrALAZINE  50 mg Oral Q8H  . DISCONTD: insulin aspart  0-15 Units Subcutaneous TID WC  . DISCONTD: insulin aspart  3 Units Subcutaneous TID WC  . DISCONTD: ipratropium  0.5 mg Nebulization Q6H  . DISCONTD: ipratropium  0.5 mg Nebulization TID  . DISCONTD: levalbuterol  0.63 mg Nebulization Q6H   Continuous Infusions:   . sodium chloride 10 mL/hr (09/28/11  2200)  . nesiritide (NATRECOR) infusion 0.01 mcg/kg/min (09/28/11 2200)  . DISCONTD: nesiritide (NATRECOR) infusion 0.01 mcg/kg/min (09/28/11 2139)   PRN Meds:.acetaminophen, acetaminophen, albuterol, alum & mag hydroxide-simeth, guaiFENesin-dextromethorphan, HYDROmorphone (DILAUDID) injection, ondansetron (ZOFRAN) IV, ondansetron, oxyCODONE, traZODone, DISCONTD: albuterol, DISCONTD: ondansetron (ZOFRAN) IV  Assessment/Plan: Acute on chronic respiratory failure due to acute on chronic systolic heart failure, EF 25-30% on 2-D echocardiogram.   Continue diuresis with Lasix.  Appreciate cardiology input for further management.  Patient started on nesiritide drip.   Diabetes  Sliding scale insulin   COPD Patient has stopped smoking. Placed her on neb treatments. Continue her home O2 at 2 L   Anemia  Hemoglobin stable.  Patient's hemoglobin has dropped by 2 g since July of 2013, previous admission.  Stool guaiac pending.    Chronic kidney disease stage III/VI Creatinine improved from previous hospitalization.  Stable.  Continue to monitor her with diuresis.  Question of healthcare associated pneumonia  Patient received one dose of Zosyn and vancomycin in the emergency room. Chest x-ray showed question of infiltrate.  Patient does not have a cough or white count to indicate infection.  Most likely fluid versus atelectasis. Will monitor symptoms for now.  Continue to hold antibiotics.  Sleep apnea  CPAP.  Code Status: Full  Family Communication: Discussed with daughter.  Discussed with Dr. Shirlee Latch, cardiology, who will assume patient's care given patient's other medical conditions are stable except patient's respiratory failure due to acute on chronic systolic heart failure which is currently being managed by Dr. Shirlee Latch.  Please do not history to call us for any questions or concerns.   LOS: 1 day  Ranald Alessio A, MD 09/29/2011, 9:35 AM

## 2011-09-29 NOTE — Clinical Social Work Psychosocial (Signed)
     Clinical Social Work Department BRIEF PSYCHOSOCIAL ASSESSMENT 09/29/2011  Patient:  [name], [name]     Account Number:  [id number]     Admit date:  09/28/2011  Clinical Social Worker:  Hulan Fray  Date/Time:  09/29/2011 10:36 AM  Referred by:  Physician  Date Referred:  09/28/2011 Referred for  SNF Placement   Other Referral:   Interview type:  Patient Other interview type:   daughter-[name]    PSYCHOSOCIAL DATA Living Status:  FACILITY Admitted from facility:  [facility] Level of care:  Skilled Nursing Facility Primary support name:  [name] Primary support relationship to patient:  CHILD, ADULT Degree of support available:   supportive    CURRENT CONCERNS Current Concerns  Post-Acute Placement   Other Concerns:    SOCIAL WORK ASSESSMENT / PLAN Clinical Social Worker received referral for SNF placement. CSW reviewed chart and saw that patient was recently discharged to [facility]. CSW spoke with patient and she deferred CSW to speak with her daughter [name]. CSW spoke with [name] briefly and she reported that she was not "happy with [facility]" and want to send her mother to a different facility. CSW will place a SNF packet in patient's room for daughter and patien to review to decide on another facility to send patient to at discharge. CSW will complete FL2 for MD's signature and intitate SNF search.   Assessment/plan status:  Psychosocial Support/Ongoing Assessment of Needs Other assessment/ plan:   Information/referral to community resources:   SNF packet    PATIENTS/FAMILYS RESPONSE TO PLAN OF CARE: Daughter does not want patient to return back to [facility] and is in search of a new facility.

## 2011-09-29 NOTE — Care Management Note (Signed)
    Page 1 of 1   09/29/2011     10:36:52 AM   CARE MANAGEMENT NOTE 09/29/2011  Patient:  [name], [name]   Account Number:  [id number]  Date Initiated:  09/29/2011  Documentation initiated by:  Junius Creamer  Subjective/Objective Assessment:   adm w chf     Action/Plan:   from nsg facility, sw ref   Anticipated DC Date:     Anticipated DC Plan:    In-house referral  Clinical Social Worker      DC Planning Services  CM consult      Choice offered to / List presented to:             Status of service:   Medicare Important Message given?   (If response is "NO", the following Medicare IM given date fields will be blank) Date Medicare IM given:   Date Additional Medicare IM given:    Discharge Disposition:    Per UR Regulation:  Reviewed for med. necessity/level of care/duration of stay  If discussed at Long Length of Stay Meetings, dates discussed:    Comments:  8/7 10:33a debbie Haydan Wedig rn,bsn [phone]

## 2011-09-29 NOTE — Progress Notes (Signed)
Pt refused cpap tonight.  She at first asked me to come back at 2200 and put her on but she thought i would be putting her on a venti mask.  She says she does not tolerate the cpap well and would prefer the venti.  i explained that the venti and cpap are different things and how they work.  She still opted not to wear the cpap and i explained that she didn't need the venti mask at this time because her sao2 was 100%.

## 2011-09-29 NOTE — Progress Notes (Signed)
Pt noted to have decrease in o2 sats to 84% on Room air when she takes off her o2 no distress noted  Education done for reason for the use of O2 and pt verbalized understanding.

## 2011-09-29 NOTE — Progress Notes (Signed)
*  PRELIMINARY RESULTS* Vascular Ultrasound Right lower extremity venous duplex has been completed.  Preliminary findings: Right= no evidence of DVT. Calf veins not well visualized due to edema.  Farrel Demark, RDMS, RVT  09/29/2011, 11:00 AM

## 2011-09-29 NOTE — Progress Notes (Signed)
Pt is very non-compliant with turning and repositioning.  After turning pt, pt complains that she is not comfortable, but refuses to tell me how to help to make her more comfortable.  Pt continuously requests to only lay on L side or supine (leaning toward/on L side); pt educated on importance of turning; but refuses to stay turned; will attempt again; will continue to monitor closely and update as needd

## 2011-09-29 NOTE — Progress Notes (Signed)
MD Foley called about pt's hypertension; no new orders received at this time; told to call if SBP gets up to 190s.  will continue to monitor closely and update as needed

## 2011-09-29 NOTE — Evaluation (Signed)
Physical Therapy Evaluation Patient Details Name: [name] MRN: [id number] DOB: August 01, 1940 Today's Date: 09/29/2011 Time: 1478-2956 PT Time Calculation (min): 12 min  PT Assessment / Plan / Recommendation Clinical Impression  Pt is a 71 y/o female admitted with SOB/COPD along with the below PT problem list.  As on prior admissions, pt continues with uncooperative attitude with poor motivation without secondary needs (ie-rolled over to answer telephone).  Agree that pt would benefit from acute PT if able to encourage to participate to maximize independence and facilitate d/c back to SNF.    PT Assessment  Patient needs continued PT services    Follow Up Recommendations  Skilled nursing facility    Barriers to Discharge None      Equipment Recommendations  Defer to next venue    Recommendations for Other Services     Frequency Min 2X/week    Precautions / Restrictions Precautions Precautions: Fall Restrictions Weight Bearing Restrictions: No   Pertinent Vitals/Pain None      Mobility  Bed Mobility Bed Mobility: Rolling Right;Rolling Left;Supine to Sit;Sit to Supine Rolling Right: 4: Min assist;With rail Rolling Left: 4: Min assist;With rail Supine to Sit: 1: +1 Total assist;HOB flat Sit to Supine: 1: +1 Total assist;HOB flat Details for Bed Mobility Assistance: Total assist to attempt supine to sit with pt resisting motion and actually pushing into extension to remain supine even though initially agreeable to mobility.  Educated on importance with pt still resisting and increasing agitation.  No further attempts able to be made. Transfers Transfers: Not assessed Ambulation/Gait Ambulation/Gait Assistance: Not tested (comment) Stairs: No Wheelchair Mobility Wheelchair Mobility: No    Exercises     PT Diagnosis: Difficulty walking;Generalized weakness  PT Problem List: Decreased strength;Decreased activity tolerance;Decreased balance;Decreased  mobility;Decreased knowledge of use of DME;Obesity PT Treatment Interventions: DME instruction;Gait training;Functional mobility training;Therapeutic activities;Balance training;Patient/family education   PT Goals Acute Rehab PT Goals PT Goal Formulation: With patient Time For Goal Achievement: 10/13/11 Potential to Achieve Goals: Fair Pt will Roll Supine to Right Side: with supervision PT Goal: Rolling Supine to Right Side - Progress: Goal set today Pt will Roll Supine to Left Side: with supervision PT Goal: Rolling Supine to Left Side - Progress: Goal set today Pt will go Supine/Side to Sit: with supervision PT Goal: Supine/Side to Sit - Progress: Goal set today Pt will go Sit to Supine/Side: with supervision PT Goal: Sit to Supine/Side - Progress: Goal set today Pt will go Sit to Stand: with supervision PT Goal: Sit to Stand - Progress: Goal set today Pt will go Stand to Sit: with supervision PT Goal: Stand to Sit - Progress: Goal set today Pt will Ambulate: 16 - 50 feet;with supervision;with least restrictive assistive device PT Goal: Ambulate - Progress: Goal set today  Visit Information  Last PT Received On: 09/29/11 Assistance Needed: +1    Subjective Data  Subjective: "I could walk at times." Patient Stated Goal: Get well.   Prior Functioning  Home Living Lives With: Other (Comment) (At [facility]) Available Help at Discharge: Skilled Nursing Facility Type of Home: Skilled Nursing Facility Home Layout: One level Home Adaptive Equipment: Walker - rolling Prior Function Level of Independence: Needs assistance Needs Assistance: Gait Gait Assistance: Min assist recently at SNF Able to Take Stairs?: No Driving: No Vocation: Retired Musician: No difficulties    Cognition  Overall Cognitive Status: Appears within functional limits for tasks assessed/performed Arousal/Alertness: Awake/alert Orientation Level: Appears intact for tasks  assessed Behavior During  Session: [employer] for tasks performed    Extremity/Trunk Assessment Right Upper Extremity Assessment RUE ROM/Strength/Tone: Deficits RUE ROM/Strength/Tone Deficits: 3/5 in shoulder strength Left Upper Extremity Assessment LUE ROM/Strength/Tone: Deficits LUE ROM/Strength/Tone Deficits: 3/5 in shoulder strength Right Lower Extremity Assessment RLE ROM/Strength/Tone: Deficits RLE ROM/Strength/Tone Deficits: Grossly 4/5 RLE Coordination: WFL - gross motor Left Lower Extremity Assessment LLE ROM/Strength/Tone: Deficits LLE ROM/Strength/Tone Deficits: Grossly 4/5 LLE Coordination: WFL - gross motor Trunk Assessment Trunk Assessment: Kyphotic   Balance Balance Balance Assessed: No  End of Session PT - End of Session Equipment Utilized During Treatment: Oxygen (3 L O2 via [place].) Activity Tolerance: Treatment limited secondary to agitation Patient left: in bed;with call bell/phone within reach Nurse Communication: Mobility status  GP     Cephus Shelling 09/29/2011, 9:19 AM  09/29/2011 Cephus Shelling, PT, DPT [phone]

## 2011-09-29 NOTE — Progress Notes (Signed)
Placed pt. On CPAP auto titrate (Min: 4, Max: 14) via nasal mask (what pt. Said she wore at home) with 2L O2 bled in. Pt. Stated that "she couldn't wear our mask because it wasn't right" Offered to get pt. A smaller mask or either a FFM & pt. Refused. Pt. Was made aware to call RT if she changed her mind and decided to wear CPAP.

## 2011-09-29 NOTE — Clinical Social Work Placement (Addendum)
    Clinical Social Work Department CLINICAL SOCIAL WORK PLACEMENT NOTE 09/29/2011  Patient:  TALAH, COOKSTON  Account Number:  [id number] Admit date:  09/28/2011  Clinical Social Worker:  Hulan Fray  Date/time:  09/29/2011 02:21 PM  Clinical Social Work is seeking post-discharge placement for this patient at the following level of care:   SKILLED NURSING   (*CSW will update this form in Epic as items are completed)   09/29/2011  Patient/family provided with [facility] Department of Clinical Social Work's list of facilities offering this level of care within the geographic area requested by the patient (or if unable, by the patient's family).  09/29/2011  Patient/family informed of their freedom to choose among providers that offer the needed level of care, that participate in Medicare, Medicaid or managed care program needed by the patient, have an available bed and are willing to accept the patient.  09/29/2011  Patient/family informed of [facility]' ownership interest in [facility], as well as of the fact that they are under no obligation to receive care at this facility.  PASARR submitted to EDS on 09/09/2011 PASARR number received from EDS on 09/09/2011  FL2 transmitted to all facilities in geographic area requested by pt/family on  10/06/11 FL2 transmitted to all facilities within larger geographic area on   Patient informed that his/her managed care company has contracts with or will negotiate with  certain facilities, including the following:     Patient/family informed of bed offers received:  10/07/2011 Patient chooses bed at [facility] Physician recommends and patient chooses bed at    Patient to be transferred to [facility] on  10/07/2011 Patient to be transferred to facility by   The following physician request were entered in Epic:   Additional Comments: Daughter is agreeable to have patient transferred back to [facility], despite  previous concerns with facility.

## 2011-09-29 NOTE — Progress Notes (Signed)
Patient ID: [name], female   DOB: 1940/07/18, 71 y.o.   MRN: [id number]    SUBJECTIVE: Still short of breath. Diuresing well so far.  BP running high.      Marland Kitchen albuterol  2.5 mg Nebulization TID  . albuterol  5 mg Nebulization Once  . aspirin EC  81 mg Oral Daily  . atorvastatin  10 mg Oral q1800  . carvedilol  12.5 mg Oral BID WC  . docusate sodium  100 mg Oral BID  . furosemide  60 mg Intravenous Q8H  . heparin subcutaneous  5,000 Units Subcutaneous Q8H  . hydrALAZINE  50 mg Oral Q8H  . insulin aspart  0-15 Units Subcutaneous TID WC  . ipratropium      . ipratropium  0.5 mg Nebulization TID  . isosorbide mononitrate  60 mg Oral Daily  . nicotine  21 mg Transdermal Daily  . paricalcitol  1 mcg Oral Daily  . piperacillin-tazobactam (ZOSYN)  IV  3.375 g Intravenous Once  . sodium chloride  3 mL Intravenous Q12H  . vancomycin  1,500 mg Intravenous Once  . DISCONTD: albuterol  2.5 mg Nebulization Q4H  . DISCONTD: albuterol  2.5 mg Nebulization TID  . DISCONTD: enoxaparin (LOVENOX) injection  40 mg Subcutaneous Q24H  . DISCONTD: furosemide  40 mg Intravenous Q12H  . DISCONTD: hydrALAZINE  25 mg Oral Q8H  . DISCONTD: hydrALAZINE  50 mg Oral Q8H  . DISCONTD: insulin aspart  0-15 Units Subcutaneous TID WC  . DISCONTD: insulin aspart  3 Units Subcutaneous TID WC  . DISCONTD: ipratropium  0.5 mg Nebulization Q6H  . DISCONTD: ipratropium  0.5 mg Nebulization TID  . DISCONTD: levalbuterol  0.63 mg Nebulization Q6H  nesiritide gtt @ 0.01    Filed Vitals:   09/29/11 0423 09/29/11 0500 09/29/11 0530 09/29/11 0600  BP:  158/67 164/67 177/67  Pulse:  72 71 67  Temp: 98 F (36.7 C)     TempSrc: Oral     Resp:  29 28 30   Height:      Weight:    223 lb 12.3 oz (101.5 kg)  SpO2:  97% 97% 100%    Intake/Output Summary (Last 24 hours) at 09/29/11 0723 Last data filed at 09/29/11 0600  Gross per 24 hour  Intake 299.92 ml  Output   1305 ml  Net -1005.08 ml     LABS: Basic Metabolic Panel:  Basename 09/29/11 0415 09/28/11 2019 09/28/11 1236  NA 144 -- 143  K 4.9 -- 4.8  CL 102 -- 103  CO2 37* -- 36*  GLUCOSE 101* -- 89  BUN 55* -- 60*  CREATININE 2.09* 2.06* --  CALCIUM 8.8 -- 8.9  MG -- -- --  PHOS -- -- --   Liver Function Tests:  Basename 09/28/11 1236  AST 15  ALT 19  ALKPHOS 51  BILITOT 0.2*  PROT 5.8*  ALBUMIN 2.5*   No results found for this basename: LIPASE:2,AMYLASE:2 in the last 72 hours CBC:  Basename 09/29/11 0415 09/28/11 2019 09/28/11 1236  WBC 8.2 8.4 --  NEUTROABS -- -- 6.4  HGB 9.0* 8.9* --  HCT 32.2* 31.1* --  MCV 86.8 85.9 --  PLT 119* 113* --   Cardiac Enzymes:  Basename 09/29/11 0415 09/28/11 2019  CKTOTAL 42 42  CKMB 3.8 3.8  CKMBINDEX -- --  TROPONINI <0.30 <0.30   BNP: No components found with this basename: POCBNP:3 D-Dimer: No results found for this basename: DDIMER:2 in the last  72 hours Hemoglobin A1C: No results found for this basename: HGBA1C in the last 72 hours Fasting Lipid Panel: No results found for this basename: CHOL,HDL,LDLCALC,TRIG,CHOLHDL,LDLDIRECT in the last 72 hours Thyroid Function Tests: No results found for this basename: TSH,T4TOTAL,FREET3,T3FREE,THYROIDAB in the last 72 hours Anemia Panel: No results found for this basename: VITAMINB12,FOLATE,FERRITIN,TIBC,IRON,RETICCTPCT in the last 72 hours  RADIOLOGY: Dg Chest 2 View  09/28/2011  *RADIOLOGY REPORT*  Clinical Data: Chest pain.  Shortness of breath.  CHEST - 2 VIEW  Comparison: 09/10/2011 and 08/07/2008  Findings: Chronic cardiomegaly.  Slight pulmonary vascular congestion.  Slight scarring in the left midzone.  Abnormal increased density at the left base posterior laterally which could represent an area of infiltrate or loculated fluid or atelectasis secondary to the enlarged heart.  No definitive effusions.  No acute osseous abnormality.  Moderate arthritis of the left glenohumeral joint.  IMPRESSION:  1.   Abnormal increased density at the left base posterior laterally, increased since the prior exam.  This could represent an area of infiltrate or effusion or atelectasis. 2.  Chronic cardiomegaly. 3.  Slight pulmonary vascular prominence.  Original Report Authenticated By: Gwynn Burly, M.D.   Ct Head Wo Contrast  09/28/2011  *RADIOLOGY REPORT*  Clinical Data: Headache  CT HEAD WITHOUT CONTRAST  Technique:  Contiguous axial images were obtained from the base of the skull through the vertex without contrast.  Comparison: Head CT of 05/01/2008  Findings: No acute intracranial hemorrhage.  No focal mass lesion. No CT evidence of acute infarction.   No midline shift or mass effect.  No hydrocephalus.  Basilar cisterns are patent.  There is a prominent CSF space anterior the frontal lobes unchanged from prior.  There is mild periventricular white matter hypodensities.  There is new fluid within the left mastoid air cells compared to prior.  Paranasal sinuses are clear.  IMPRESSION:  1.  No acute intracranial findings. 2.  New fluid in the left mastoid air cells.  Original Report Authenticated By: Genevive Bi, M.D.    PHYSICAL EXAM General: NAD  Neck: JVP 14-16 cm, no thyromegaly or thyroid nodule.  Lungs: Decreased breath sounds with bilateral wheezes.  CV: Nondisplaced PMI. Heart regular S1/S2, no S3/S4, 1/6 HSM LLSB. 2+ edema 1/2 up lower legs. No carotid bruit. Unable to feel pulses due to edema.  Abdomen: Soft, nontender, no hepatosplenomegaly, no distention.  Skin: Intact without lesions or rashes.  Neurologic: Alert and oriented x 3.  Psych: Normal affect.  Extremities: No clubbing or cyanosis.  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  1. CHF: She is still volume overloaded on exam, with acute on chronic systolic CHF. EF 25-30% on  last echo. Diuresis in the past has been complicated by cardiorenal syndrome with ARF. Cardiac index was low (1.94) on RHC at last hospitalization. I do not want to start dopamine or milrinone as she is hypertensive. I have her on nesiritide gtt for afterload reduction while we are diuresing her, in hope that improved cardiac output will help lessen renal effect. I will continue Lasix 60 mg IV every 8 hours for now, will make adjustments based on her output today.  Given elevated BP, will titrate up hydralazine. Continue same dose of Coreg and Imdur for now. Follow weights and I/Os.  2. CKD: Creatinine stable this morning.  Management of her CHF is significantly  effected by cardiorenal syndrome. Will need to follow creatinine closely with diuresis.  3. ID: LLL opacity. More likely atelectasis given lack of fever and normal WBCs, but being covered empirically for HCAP. Procalcitonin is low.  4. Pulmonary: She is on home oxygen and has diagnoses of COPD and OHS/OSA. She had significantly elevated PA pressure by echo last month, but after diuresis, PA pressure on RHC was only mildly elevated. Continue CPAP at night.   [name] 09/29/2011 7:24 AM

## 2011-09-30 LAB — CBC
HCT: 30.9 % — ABNORMAL LOW (ref 36.0–46.0)
MCH: 24.7 pg — ABNORMAL LOW (ref 26.0–34.0)
MCV: 85.6 fL (ref 78.0–100.0)
Platelets: 124 10*3/uL — ABNORMAL LOW (ref 150–400)
RBC: 3.61 MIL/uL — ABNORMAL LOW (ref 3.87–5.11)
RDW: 18.3 % — ABNORMAL HIGH (ref 11.5–15.5)

## 2011-09-30 LAB — GLUCOSE, CAPILLARY: Glucose-Capillary: 105 mg/dL — ABNORMAL HIGH (ref 70–99)

## 2011-09-30 LAB — BASIC METABOLIC PANEL
CO2: 34 mEq/L — ABNORMAL HIGH (ref 19–32)
Calcium: 8.6 mg/dL (ref 8.4–10.5)
Chloride: 104 mEq/L (ref 96–112)
Creatinine, Ser: 2.05 mg/dL — ABNORMAL HIGH (ref 0.50–1.10)
Glucose, Bld: 90 mg/dL (ref 70–99)

## 2011-09-30 NOTE — Progress Notes (Signed)
Patient ID: [name], female   DOB: [birth year], 71 y.o.   MRN: [id number]     SUBJECTIVE: Still short of breath. Diuresing well so far.  BP better with higher hydralazine. Creatinine stable.      Marland Kitchen albuterol  2.5 mg Nebulization TID  . aspirin EC  81 mg Oral Daily  . atorvastatin  10 mg Oral q1800  . carvedilol  12.5 mg Oral BID WC  . docusate sodium  100 mg Oral BID  . furosemide  60 mg Intravenous Q8H  . heparin subcutaneous  5,000 Units Subcutaneous Q8H  . hydrALAZINE  50 mg Oral Q8H  . insulin aspart  0-9 Units Subcutaneous TID WC  . ipratropium      . ipratropium  0.5 mg Nebulization TID  . isosorbide mononitrate  60 mg Oral Daily  . nicotine  21 mg Transdermal Daily  . paricalcitol  1 mcg Oral Daily  . sodium chloride  3 mL Intravenous Q12H  . DISCONTD: hydrALAZINE  25 mg Oral Q8H  . DISCONTD: insulin aspart  0-15 Units Subcutaneous TID WC  nesiritide gtt @ 0.01    Filed Vitals:   09/30/11 0000 09/30/11 0200 09/30/11 0405 09/30/11 0600  BP: 144/60 147/56 132/99 149/55  Pulse: 61 67 70   Temp:   99 F (37.2 C)   TempSrc:   Oral   Resp: 22 22 16 22   Height:      Weight:      SpO2: 95% 99% 99% 100%    Intake/Output Summary (Last 24 hours) at 09/30/11 0711 Last data filed at 09/30/11 0600  Gross per 24 hour  Intake  718.4 ml  Output   3150 ml  Net -2431.6 ml    LABS: Basic Metabolic Panel:  Basename 09/30/11 0540 09/29/11 0415  NA 145 144  K 4.9 4.9  CL 104 102  CO2 34* 37*  GLUCOSE 90 101*  BUN 53* 55*  CREATININE 2.05* 2.09*  CALCIUM 8.6 8.8  MG -- --  PHOS -- --   Liver Function Tests:  Basename 09/28/11 1236  AST 15  ALT 19  ALKPHOS 51  BILITOT 0.2*  PROT 5.8*  ALBUMIN 2.5*   No results found for this basename: LIPASE:2,AMYLASE:2 in the last 72 hours CBC:  Basename 09/30/11 0540 09/29/11 0415 09/28/11 1236  WBC 7.7 8.2 --  NEUTROABS -- -- 6.4  HGB 8.9* 9.0* --  HCT 30.9* 32.2* --  MCV 85.6 86.8 --  PLT 124* 119* --    Cardiac Enzymes:  Basename 09/29/11 1207 09/29/11 0415 09/28/11 2019  CKTOTAL 39 42 42  CKMB 3.7 3.8 3.8  CKMBINDEX -- -- --  TROPONINI <0.30 <0.30 <0.30   BNP: No components found with this basename: POCBNP:3 D-Dimer: No results found for this basename: DDIMER:2 in the last 72 hours Hemoglobin A1C: No results found for this basename: HGBA1C in the last 72 hours Fasting Lipid Panel: No results found for this basename: CHOL,HDL,LDLCALC,TRIG,CHOLHDL,LDLDIRECT in the last 72 hours Thyroid Function Tests: No results found for this basename: TSH,T4TOTAL,FREET3,T3FREE,THYROIDAB in the last 72 hours Anemia Panel: No results found for this basename: VITAMINB12,FOLATE,FERRITIN,TIBC,IRON,RETICCTPCT in the last 72 hours  RADIOLOGY: Dg Chest 2 View  09/28/2011  *RADIOLOGY REPORT*  Clinical Data: Chest pain.  Shortness of breath.  CHEST - 2 VIEW  Comparison: 09/10/2011 and 08/07/2008  Findings: Chronic cardiomegaly.  Slight pulmonary vascular congestion.  Slight scarring in the left midzone.  Abnormal increased density at the left base posterior laterally which  could represent an area of infiltrate or loculated fluid or atelectasis secondary to the enlarged heart.  No definitive effusions.  No acute osseous abnormality.  Moderate arthritis of the left glenohumeral joint.  IMPRESSION:  1.  Abnormal increased density at the left base posterior laterally, increased since the prior exam.  This could represent an area of infiltrate or effusion or atelectasis. 2.  Chronic cardiomegaly. 3.  Slight pulmonary vascular prominence.  Original Report Authenticated By: Gwynn Burly, M.D.   Ct Head Wo Contrast  09/28/2011  *RADIOLOGY REPORT*  Clinical Data: Headache  CT HEAD WITHOUT CONTRAST  Technique:  Contiguous axial images were obtained from the base of the skull through the vertex without contrast.  Comparison: Head CT of 05/01/2008  Findings: No acute intracranial hemorrhage.  No focal mass lesion. No CT  evidence of acute infarction.   No midline shift or mass effect.  No hydrocephalus.  Basilar cisterns are patent.  There is a prominent CSF space anterior the frontal lobes unchanged from prior.  There is mild periventricular white matter hypodensities.  There is new fluid within the left mastoid air cells compared to prior.  Paranasal sinuses are clear.  IMPRESSION:  1.  No acute intracranial findings. 2.  New fluid in the left mastoid air cells.  Original Report Authenticated By: Genevive Bi, M.D.    PHYSICAL EXAM General: NAD  Neck: JVP 14-16 cm, no thyromegaly or thyroid nodule.  Lungs: Decreased breath sounds with bilateral wheezes.  CV: Nondisplaced PMI. Heart regular S1/S2, no S3/S4, 1/6 HSM LLSB. 2+ edema 1/2 up lower legs. No carotid bruit. Unable to feel pulses due to edema.  Abdomen: Soft, nontender, no hepatosplenomegaly, no distention.  Skin: Intact without lesions or rashes.  Neurologic: Alert and oriented x 3.  Psych: Normal affect.  Extremities: No clubbing or cyanosis.  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  1. CHF: She is still volume overloaded on exam, with acute on chronic systolic CHF. EF 25-30% on last echo. Diuresis in the past has been complicated by cardiorenal syndrome with ARF. Cardiac index was low (1.94) on RHC at last hospitalization. I have her on nesiritide gtt for afterload reduction while we are diuresing her, in hope that improved cardiac output will help lessen renal effect. I will continue Lasix 60 mg IV every 8 hours for now given good urine output.  Continue same dose of Coreg, hydralazine, and Imdur for now. Follow weights and I/Os.  2. CKD: Creatinine stable this morning.  Management of her CHF is significantly effected by cardiorenal syndrome. Will need to follow creatinine closely with  diuresis.  3. ID: LLL opacity. More likely atelectasis given lack of fever and normal WBCs. 4. Pulmonary: She is on home oxygen and has diagnoses of COPD and OHS/OSA. She had significantly elevated PA pressure by echo last month, but after diuresis, PA pressure on RHC was only mildly elevated. Continue CPAP at night.  5. Out of bed, work with PT.   [name] 09/30/2011 7:11 AM

## 2011-09-30 NOTE — ED Provider Notes (Signed)
  I performed a history and physical examination of [name] and discussed her management with Dr. Christain Sacramento.  I agree with the history, physical, assessment, and plan of care, with the following exceptions: None  Case well described by Dr. Christain Sacramento.  Patient was admitted after her initial eval had concerning indications of possible PNA, new anemia, and CHF exacerbation.   Date: 09/28/2011  Rate: 67   Rhythm: sr  QRS Axis: lad  Intervals: ivcd  ST/T Wave abnormalities: nonspecific t wave changes, lvh  Conduction Disutrbances:ivcd  Narrative Interpretation: abnormal  Old EKG Reviewed: none avail    [name], Elvis Coil, MD 09/30/11 2337

## 2011-10-01 LAB — URINALYSIS, ROUTINE W REFLEX MICROSCOPIC
Glucose, UA: NEGATIVE mg/dL
Hgb urine dipstick: NEGATIVE
Protein, ur: >300 mg/dL — AB
Specific Gravity, Urine: 1.013 (ref 1.005–1.030)
pH: 6.5 (ref 5.0–8.0)

## 2011-10-01 LAB — BASIC METABOLIC PANEL
CO2: 39 mEq/L — ABNORMAL HIGH (ref 19–32)
Chloride: 100 mEq/L (ref 96–112)
Glucose, Bld: 92 mg/dL (ref 70–99)
Potassium: 4.3 mEq/L (ref 3.5–5.1)
Sodium: 144 mEq/L (ref 135–145)

## 2011-10-01 LAB — GLUCOSE, CAPILLARY
Glucose-Capillary: 86 mg/dL (ref 70–99)
Glucose-Capillary: 119 mg/dL — ABNORMAL HIGH (ref 70–99)

## 2011-10-01 LAB — URINE MICROSCOPIC-ADD ON

## 2011-10-01 MED ORDER — HYDRALAZINE HCL 25 MG PO TABS
75.0000 mg | ORAL_TABLET | Freq: Three times a day (TID) | ORAL | Status: DC
  Administered 2011-10-01 – 2011-10-04 (×9): 75 mg via ORAL
  Filled 2011-10-01 (×12): qty 1

## 2011-10-01 MED ORDER — ISOSORBIDE MONONITRATE ER 60 MG PO TB24
90.0000 mg | ORAL_TABLET | Freq: Every day | ORAL | Status: DC
  Administered 2011-10-02: 90 mg via ORAL
  Filled 2011-10-01 (×3): qty 1

## 2011-10-01 NOTE — Progress Notes (Signed)
Physical Therapy Treatment Patient Details Name: [name] MRN: [id number] DOB: 26-Aug-1940 Today's Date: 10/01/2011 Time: 4696-2952 PT Time Calculation (min): 8 min  PT Assessment / Plan / Recommendation Comments on Treatment Session  Pt. with limited participation today, only agreeable to LE exercises while up in recliner.    Follow Up Recommendations  Skilled nursing facility    Barriers to Discharge        Equipment Recommendations  Defer to next venue    Recommendations for Other Services    Frequency Min 2X/week   Plan Discharge plan remains appropriate    Precautions / Restrictions Precautions Precautions: Fall Restrictions Weight Bearing Restrictions: No   Pertinent Vitals/Pain No pain, no distress    Mobility  Bed Mobility Bed Mobility: Not assessed Transfers Transfers: Not assessed Ambulation/Gait Ambulation/Gait Assistance: Not tested (comment)    Exercises General Exercises - Lower Extremity Ankle Circles/Pumps: AROM;10 reps;Both;Seated Quad Sets: AROM;10 reps;Supine Hip ABduction/ADduction: AROM;Both;10 reps;Seated   PT Diagnosis:    PT Problem List:   PT Treatment Interventions:     PT Goals    Visit Information  Last PT Received On: 10/01/11 Assistance Needed: +1    Subjective Data  Subjective: "yeah I guess" (when asked if she would work with PT)   Cognition  Overall Cognitive Status: Appears within functional limits for tasks assessed/performed Arousal/Alertness: Awake/alert Orientation Level: Appears intact for tasks assessed Behavior During Session: [facility] for tasks performed    Balance     End of Session PT - End of Session Equipment Utilized During Treatment: Oxygen Activity Tolerance: Other (comment) (limited secondary to motivation level) Patient left: in chair;with call bell/phone within reach Nurse Communication: Mobility status   GP     Ferman Hamming 10/01/2011, 3:22 PM Weldon Picking PT Acute Rehab  Services [phone] Beeper [phone]

## 2011-10-01 NOTE — Progress Notes (Signed)
Pt refused to wear cpap at this time, removed machine from room but left circuit and mask

## 2011-10-01 NOTE — Progress Notes (Signed)
Clinical Social Worker spoke with patient's daughter, Sunday Corn regarding her concerns with [facility]. Per [name], she needed to speak with administration at the facility to discuss her concerns before she will send the patient back to the facility. CSW inquired if she has spoken with the facility and daughter stated that she has not. CSW asked daughter if a preliminary search can be done for other facilities and the daughter did not want this to happen. Daughter stated that she will call CSW back. As of now, the discharge plan is for patient to return back to [facility], when medically stable.   Rozetta Nunnery MSW, [employer] [phone]

## 2011-10-01 NOTE — Progress Notes (Addendum)
Patient ID: [name], female   DOB: [birth year], 71 y.o.   MRN: [id number]  SUBJECTIVE: Still short of breath. Continues to diurese well.  BUN/creatinine stable.  She has not been out of bed.  She is not very motivated to get out of bed.     Marland Kitchen albuterol  2.5 mg Nebulization TID  . aspirin EC  81 mg Oral Daily  . atorvastatin  10 mg Oral q1800  . carvedilol  12.5 mg Oral BID WC  . docusate sodium  100 mg Oral BID  . furosemide  60 mg Intravenous Q8H  . heparin subcutaneous  5,000 Units Subcutaneous Q8H  . hydrALAZINE  75 mg Oral Q8H  . insulin aspart  0-9 Units Subcutaneous TID WC  . ipratropium  0.5 mg Nebulization TID  . isosorbide mononitrate  90 mg Oral Daily  . nicotine  21 mg Transdermal Daily  . paricalcitol  1 mcg Oral Daily  . sodium chloride  3 mL Intravenous Q12H  . DISCONTD: hydrALAZINE  50 mg Oral Q8H  . DISCONTD: isosorbide mononitrate  60 mg Oral Daily  nesiritide gtt @ 0.01    Filed Vitals:   10/01/11 0500 10/01/11 0719 10/01/11 0721 10/01/11 0918  BP:  158/61    Pulse:   61   Temp:   98.4 F (36.9 C)   TempSrc:   Oral   Resp:      Height:      Weight: 221 lb 9 oz (100.5 kg)     SpO2:   95% 97%    Intake/Output Summary (Last 24 hours) at 10/01/11 1008 Last data filed at 10/01/11 0700  Gross per 24 hour  Intake    496 ml  Output   3625 ml  Net  -3129 ml    LABS: Basic Metabolic Panel:  Basename 10/01/11 0912 09/30/11 0540  NA 144 145  K 4.3 4.9  CL 100 104  CO2 39* 34*  GLUCOSE 92 90  BUN 47* 53*  CREATININE 2.00* 2.05*  CALCIUM 9.1 8.6  MG -- --  PHOS -- --   Liver Function Tests:  Basename 09/28/11 1236  AST 15  ALT 19  ALKPHOS 51  BILITOT 0.2*  PROT 5.8*  ALBUMIN 2.5*   No results found for this basename: LIPASE:2,AMYLASE:2 in the last 72 hours CBC:  Basename 09/30/11 0540 09/29/11 0415 09/28/11 1236  WBC 7.7 8.2 --  NEUTROABS -- -- 6.4  HGB 8.9* 9.0* --  HCT 30.9* 32.2* --  MCV 85.6 86.8 --  PLT 124* 119* --    Cardiac Enzymes:  Basename 09/29/11 1207 09/29/11 0415 09/28/11 2019  CKTOTAL 39 42 42  CKMB 3.7 3.8 3.8  CKMBINDEX -- -- --  TROPONINI <0.30 <0.30 <0.30   BNP: No components found with this basename: POCBNP:3 D-Dimer: No results found for this basename: DDIMER:2 in the last 72 hours Hemoglobin A1C: No results found for this basename: HGBA1C in the last 72 hours Fasting Lipid Panel: No results found for this basename: CHOL,HDL,LDLCALC,TRIG,CHOLHDL,LDLDIRECT in the last 72 hours Thyroid Function Tests: No results found for this basename: TSH,T4TOTAL,FREET3,T3FREE,THYROIDAB in the last 72 hours Anemia Panel: No results found for this basename: VITAMINB12,FOLATE,FERRITIN,TIBC,IRON,RETICCTPCT in the last 72 hours  RADIOLOGY: Dg Chest 2 View  09/28/2011  *RADIOLOGY REPORT*  Clinical Data: Chest pain.  Shortness of breath.  CHEST - 2 VIEW  Comparison: 09/10/2011 and 08/07/2008  Findings: Chronic cardiomegaly.  Slight pulmonary vascular congestion.  Slight scarring in the left midzone.  Abnormal increased density at the left base posterior laterally which could represent an area of infiltrate or loculated fluid or atelectasis secondary to the enlarged heart.  No definitive effusions.  No acute osseous abnormality.  Moderate arthritis of the left glenohumeral joint.  IMPRESSION:  1.  Abnormal increased density at the left base posterior laterally, increased since the prior exam.  This could represent an area of infiltrate or effusion or atelectasis. 2.  Chronic cardiomegaly. 3.  Slight pulmonary vascular prominence.  Original Report Authenticated By: Gwynn Burly, M.D.   Ct Head Wo Contrast  09/28/2011  *RADIOLOGY REPORT*  Clinical Data: Headache  CT HEAD WITHOUT CONTRAST  Technique:  Contiguous axial images were obtained from the base of the skull through the vertex without contrast.  Comparison: Head CT of 05/01/2008  Findings: No acute intracranial hemorrhage.  No focal mass lesion. No CT  evidence of acute infarction.   No midline shift or mass effect.  No hydrocephalus.  Basilar cisterns are patent.  There is a prominent CSF space anterior the frontal lobes unchanged from prior.  There is mild periventricular white matter hypodensities.  There is new fluid within the left mastoid air cells compared to prior.  Paranasal sinuses are clear.  IMPRESSION:  1.  No acute intracranial findings. 2.  New fluid in the left mastoid air cells.  Original Report Authenticated By: Genevive Bi, M.D.    PHYSICAL EXAM General: NAD  Neck: JVP 14 cm, no thyromegaly or thyroid nodule.  Lungs: Decreased breath sounds with bilateral wheezes.  CV: Nondisplaced PMI. Heart regular S1/S2, no S3/S4, 1/6 HSM LLSB. 2+ edema 1/2 up lower legs. No carotid bruit. Unable to feel pulses due to edema.  Abdomen: Soft, nontender, no hepatosplenomegaly, no distention.  Skin: Intact without lesions or rashes.  Neurologic: Alert and oriented x 3.  Psych: Flat affect.  Extremities: No clubbing or cyanosis.  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  1. CHF: She is still volume overloaded on exam, with acute on chronic systolic CHF. EF 25-30% on last echo. Diuresis in the past has been complicated by cardiorenal syndrome with ARF. Cardiac index was low (1.94) on RHC at last hospitalization. I have her on nesiritide gtt for afterload reduction while we are diuresing her, in hope that improved cardiac output will help lessen renal effect. I will continue Lasix 60 mg IV every 8 hours for now given good urine output.  Continue same dose of Coreg.  Increase hydralazine to 75 mg tid and Imdur to 90 mg daily given hypertension. Follow weights and I/Os.  2. CKD: Creatinine stable this morning.  Management of her CHF is significantly effected by cardiorenal syndrome. Will  need to follow creatinine closely with diuresis.  3. ID: LLL opacity. More likely atelectasis given lack of fever and normal WBCs.  Noted no WBCs on UA but many yeast.  Will repeat UA today.  4. Pulmonary: She is on home oxygen and has diagnoses of COPD and OHS/OSA. She had significantly elevated PA pressure by echo last month, but after diuresis, PA pressure on RHC was only mildly elevated. Continue CPAP at night.  5. Needs to mobilize.  Will try to get PT to come by again and will try to get her nurse to get her out of bed.  Eventual discharge to SNF.   [name] 10/01/2011 10:08  AM

## 2011-10-02 LAB — BASIC METABOLIC PANEL
BUN: 47 mg/dL — ABNORMAL HIGH (ref 6–23)
CO2: 37 mEq/L — ABNORMAL HIGH (ref 19–32)
Calcium: 8.8 mg/dL (ref 8.4–10.5)
Glucose, Bld: 97 mg/dL (ref 70–99)
Sodium: 143 mEq/L (ref 135–145)

## 2011-10-02 LAB — GLUCOSE, CAPILLARY
Glucose-Capillary: 82 mg/dL (ref 70–99)
Glucose-Capillary: 91 mg/dL (ref 70–99)
Glucose-Capillary: 101 mg/dL — ABNORMAL HIGH (ref 70–99)

## 2011-10-02 LAB — PRO B NATRIURETIC PEPTIDE: Pro B Natriuretic peptide (BNP): 17696 pg/mL — ABNORMAL HIGH (ref 0–125)

## 2011-10-02 MED ORDER — SODIUM CHLORIDE 0.9 % IJ SOLN
10.0000 mL | Freq: Two times a day (BID) | INTRAMUSCULAR | Status: DC
  Administered 2011-10-02 – 2011-10-05 (×6): 10 mL

## 2011-10-02 MED ORDER — SODIUM CHLORIDE 0.9 % IJ SOLN
10.0000 mL | INTRAMUSCULAR | Status: DC | PRN
  Administered 2011-10-07: 10 mL

## 2011-10-02 NOTE — Progress Notes (Signed)
SUBJECTIVE:  No chest pain.  No SOB   PHYSICAL EXAM Filed Vitals:   10/02/11 0700 10/02/11 0738 10/02/11 0800 10/02/11 0912  BP:   162/60   Pulse: 67  69   Temp:  97.6 F (36.4 C)    TempSrc:  Oral    Resp: 28  26   Height:      Weight:      SpO2: 93%  98% 100%   General:  No chest pain Lungs:  Clear Heart:  RRR Abdomen:  Positive bowel sounds, no rebound no guarding Extremities:  Moderate edema  LABS:  Results for orders placed during the hospital encounter of 09/28/11 (from the past 24 hour(s))  URINALYSIS, ROUTINE W REFLEX MICROSCOPIC     Status: Abnormal   Collection Time   10/01/11 11:10 AM      Component Value Range   Color, Urine YELLOW  YELLOW   APPearance CLEAR  CLEAR   Specific Gravity, Urine 1.013  1.005 - 1.030   pH 6.5  5.0 - 8.0   Glucose, UA NEGATIVE  NEGATIVE mg/dL   Hgb urine dipstick NEGATIVE  NEGATIVE   Bilirubin Urine NEGATIVE  NEGATIVE   Ketones, ur NEGATIVE  NEGATIVE mg/dL   Protein, ur >161 (*) NEGATIVE mg/dL   Urobilinogen, UA 1.0  0.0 - 1.0 mg/dL   Nitrite NEGATIVE  NEGATIVE   Leukocytes, UA TRACE (*) NEGATIVE  URINE MICROSCOPIC-ADD ON     Status: Abnormal   Collection Time   10/01/11 11:10 AM      Component Value Range   Squamous Epithelial / LPF RARE  RARE   WBC, UA 0-2  <3 WBC/hpf   RBC / HPF 0-2  <3 RBC/hpf   Bacteria, UA RARE  RARE   Casts HYALINE CASTS (*) NEGATIVE   Urine-Other RARE YEAST    GLUCOSE, CAPILLARY     Status: Abnormal   Collection Time   10/01/11 12:01 PM      Component Value Range   Glucose-Capillary 119 (*) 70 - 99 mg/dL  GLUCOSE, CAPILLARY     Status: Abnormal   Collection Time   10/01/11  4:59 PM      Component Value Range   Glucose-Capillary 122 (*) 70 - 99 mg/dL  BASIC METABOLIC PANEL     Status: Abnormal   Collection Time   10/02/11  5:15 AM      Component Value Range   Sodium 143  135 - 145 mEq/L   Potassium 4.5  3.5 - 5.1 mEq/L   Chloride 98  96 - 112 mEq/L   CO2 37 (*) 19 - 32 mEq/L   Glucose, Bld  97  70 - 99 mg/dL   BUN 47 (*) 6 - 23 mg/dL   Creatinine, Ser 0.96 (*) 0.50 - 1.10 mg/dL   Calcium 8.8  8.4 - 04.5 mg/dL   GFR calc non Af Amer 25 (*) >90 mL/min   GFR calc Af Amer 29 (*) >90 mL/min  PRO B NATRIURETIC PEPTIDE     Status: Abnormal   Collection Time   10/02/11  5:15 AM      Component Value Range   Pro B Natriuretic peptide (BNP) 17696.0 (*) 0 - 125 pg/mL  GLUCOSE, CAPILLARY     Status: Normal   Collection Time   10/02/11  7:40 AM      Component Value Range   Glucose-Capillary 82  70 - 99 mg/dL    Intake/Output Summary (Last 24 hours) at 10/02/11 0935  Last data filed at 10/02/11 0700  Gross per 24 hour  Intake  646.4 ml  Output   2735 ml  Net -2088.6 ml     ASSESSMENT AND PLAN:   1. CHF:  She remains on nesiritide gtt and Lasix 60 mg IV every 8 hours for now given good urine output. Continue same dose of Coreg, hydralazine, and Imdur for now. Follow weights and I/Os.   2. CKD: Creatinine stable again this morning. Will need to follow creatinine closely with diuresis.   3. ID: LLL opacity. No evidence of infection.  4. Pulmonary.  Multifactorial respiratory problems.  Continue CPAP at night.   5. Out of bed, work with PT.   There is concern from the family with her current nursing home.  They are discussing this with the social workers.  Patient only participated with LE exercises and did not ambulate yesterday.     Fayrene Fearing [facility] 10/02/2011 9:35 AM

## 2011-10-02 NOTE — Progress Notes (Signed)
PT has refused CPAP for the night. There is no machine in her room. PT's vitals all WNL. RT will continue to monitor.

## 2011-10-03 LAB — BASIC METABOLIC PANEL
BUN: 45 mg/dL — ABNORMAL HIGH (ref 6–23)
Chloride: 96 mEq/L (ref 96–112)
GFR calc Af Amer: 28 mL/min — ABNORMAL LOW (ref >90)
Potassium: 4.2 mEq/L (ref 3.5–5.1)
Sodium: 141 mEq/L (ref 135–145)

## 2011-10-03 LAB — GLUCOSE, CAPILLARY
Glucose-Capillary: 85 mg/dL (ref 70–99)
Glucose-Capillary: 117 mg/dL — ABNORMAL HIGH (ref 70–99)
Glucose-Capillary: 104 mg/dL — ABNORMAL HIGH (ref 70–99)

## 2011-10-03 MED ORDER — CARVEDILOL 12.5 MG PO TABS
18.7500 mg | ORAL_TABLET | Freq: Two times a day (BID) | ORAL | Status: DC
  Administered 2011-10-03 – 2011-10-07 (×9): 18.75 mg via ORAL
  Filled 2011-10-03 (×11): qty 1

## 2011-10-03 MED ORDER — ISOSORBIDE MONONITRATE ER 60 MG PO TB24
120.0000 mg | ORAL_TABLET | Freq: Every day | ORAL | Status: DC
  Administered 2011-10-03 – 2011-10-07 (×5): 120 mg via ORAL
  Filled 2011-10-03 (×5): qty 2

## 2011-10-03 NOTE — Progress Notes (Signed)
   SUBJECTIVE:  No chest pain.  No SOB   PHYSICAL EXAM Filed Vitals:   10/03/11 0600 10/03/11 0758 10/03/11 0800 10/03/11 0901  BP: 167/65  148/54   Pulse:      Temp:  98.1 F (36.7 C)    TempSrc:  Oral    Resp: 15     Height:      Weight:      SpO2: 95% 96% 96% 93%   General:  No chest pain Lungs:  Clear Heart:  RRR Abdomen:  Positive bowel sounds, no rebound no guarding Extremities:  Moderate edema improved  LABS:  Results for orders placed during the hospital encounter of 09/28/11 (from the past 24 hour(s))  GLUCOSE, CAPILLARY     Status: Normal   Collection Time   10/02/11 12:13 PM      Component Value Range   Glucose-Capillary 93  70 - 99 mg/dL  GLUCOSE, CAPILLARY     Status: Normal   Collection Time   10/02/11  4:02 PM      Component Value Range   Glucose-Capillary 91  70 - 99 mg/dL  GLUCOSE, CAPILLARY     Status: Abnormal   Collection Time   10/02/11 11:05 PM      Component Value Range   Glucose-Capillary 101 (*) 70 - 99 mg/dL  BASIC METABOLIC PANEL     Status: Abnormal   Collection Time   10/03/11  4:21 AM      Component Value Range   Sodium 141  135 - 145 mEq/L   Potassium 4.2  3.5 - 5.1 mEq/L   Chloride 96  96 - 112 mEq/L   CO2 38 (*) 19 - 32 mEq/L   Glucose, Bld 90  70 - 99 mg/dL   BUN 45 (*) 6 - 23 mg/dL   Creatinine, Ser 1.61 (*) 0.50 - 1.10 mg/dL   Calcium 8.9  8.4 - 09.6 mg/dL   GFR calc non Af Amer 24 (*) >90 mL/min   GFR calc Af Amer 28 (*) >90 mL/min  GLUCOSE, CAPILLARY     Status: Normal   Collection Time   10/03/11  7:56 AM      Component Value Range   Glucose-Capillary 85  70 - 99 mg/dL    Intake/Output Summary (Last 24 hours) at 10/03/11 0913 Last data filed at 10/03/11 0800  Gross per 24 hour  Intake 1254.8 ml  Output   4050 ml  Net -2795.2 ml     ASSESSMENT AND PLAN:   1. CHF:  She remains on nesiritide gtt and Lasix 60 mg IV every 8 hours for now given good urine output. Follow weights and I/Os. Wt is down 6 lbs since  yesterday.  I will increase the Coreg and Imdur.  2. CKD: Creatinine stable again this morning. Will need to follow creatinine closely with diuresis.   3. ID: LLL opacity. No evidence of infection.  4. Pulmonary.  Multifactorial respiratory problems.  She refuses CPAP.  5. Out of bed, work with PT.   There is concern from the family with her current nursing home.  They are discussing this with the social workers.  Continue to encourage PT.   Rollene Rotunda 10/03/2011 9:13 AM

## 2011-10-04 LAB — GLUCOSE, CAPILLARY
Glucose-Capillary: 110 mg/dL — ABNORMAL HIGH (ref 70–99)
Glucose-Capillary: 111 mg/dL — ABNORMAL HIGH (ref 70–99)

## 2011-10-04 LAB — BASIC METABOLIC PANEL
CO2: 40 mEq/L (ref 19–32)
Calcium: 8.7 mg/dL (ref 8.4–10.5)
GFR calc Af Amer: 25 mL/min — ABNORMAL LOW (ref >90)
GFR calc non Af Amer: 22 mL/min — ABNORMAL LOW (ref >90)
Sodium: 139 mEq/L (ref 135–145)

## 2011-10-04 LAB — CARBOXYHEMOGLOBIN
Carboxyhemoglobin: 2.5 % — ABNORMAL HIGH (ref 0.5–1.5)
Methemoglobin: 1.3 % (ref 0.0–1.5)
O2 Saturation: 79.2 %

## 2011-10-04 MED ORDER — HYDRALAZINE HCL 50 MG PO TABS
100.0000 mg | ORAL_TABLET | Freq: Three times a day (TID) | ORAL | Status: DC
  Filled 2011-10-04 (×3): qty 2

## 2011-10-04 MED ORDER — ACETAZOLAMIDE 250 MG PO TABS
250.0000 mg | ORAL_TABLET | Freq: Two times a day (BID) | ORAL | Status: DC
  Administered 2011-10-04 – 2011-10-07 (×7): 250 mg via ORAL
  Filled 2011-10-04 (×8): qty 1

## 2011-10-04 MED ORDER — HYDRALAZINE HCL 50 MG PO TABS
75.0000 mg | ORAL_TABLET | Freq: Three times a day (TID) | ORAL | Status: DC
  Administered 2011-10-04 – 2011-10-07 (×9): 75 mg via ORAL
  Filled 2011-10-04 (×12): qty 1

## 2011-10-04 MED ORDER — FUROSEMIDE 10 MG/ML IJ SOLN
80.0000 mg | Freq: Three times a day (TID) | INTRAMUSCULAR | Status: DC
  Administered 2011-10-04 – 2011-10-05 (×3): 80 mg via INTRAVENOUS
  Filled 2011-10-04 (×4): qty 8

## 2011-10-04 NOTE — Progress Notes (Signed)
CRITICAL VALUE ALERT  Critical value received: Co2 40  Date of notification:  10/04/2011  Time of notification:  0530  Critical value read back:yes  Nurse who received alert:  Rise Patience  MD notified (1st page):  Elink   Time of first page:  0532  MD notified (2nd page):  Time of second page:  Responding MD:   Time MD responded:  [phone]

## 2011-10-04 NOTE — Progress Notes (Signed)
Advanced Heart Failure Rounding Note   Subjective:    [name] with past medical history significant for multiple medical problems including COPD, diabetes, congestive heart failure, CKD stage IV, OHS/OSA, and hypertension. She has not had LHC due to renal function. This is her 2 admission in 30 days.    09/13/11 RHC RA: 12/13/7  RV: 40/9/13  PCWP: 16  PA: 45/17 (28)  Cardiac Output  Thermodilution: 4.16 Index 1.97  Fick : 4.09 Index 1.94  Arterial Sat: 94% ( estimated from pulse oxymetry)  PA Sat: 52%.   09/06/11 ECHO EF 25-30%  09/07/11 Renal Ultrasound no stenosis  Discharged from [facility] 09/16/11 after being treated with IV lasix and nesiritide. Later transitioned to torsemide 20mg  BID. Discharge to SNF. Discharge weight 246 pounds.   Readmitted 09/28/11 due to dyspnea. Admit weight 246 pounds. Pro BNP 17,696 Diuresed with IV lasix. Day 6 of Nesiritide. Hydralazine titrated up to 75 mg tid and carvedilol tirated up to 18.75 mg Weight down 34 pounds. Limited mobility.    Feels ok. Flat affect. Denies CP/PND/Orthopnea  Objective:   Weight Range:  Vital Signs:   Temp:  [97.7 F (36.5 C)-99.8 F (37.7 C)] 97.7 F (36.5 C) (08/12 0725) Pulse Rate:  [66] 66  (08/12 0725) Resp:  [16-18] 18  (08/12 0725) BP: (104-152)/(45-74) 152/45 mmHg (08/12 0725) SpO2:  [77 %-98 %] 98 % (08/12 0725) Weight:  [96.5 kg (212 lb 11.9 oz)] 96.5 kg (212 lb 11.9 oz) (08/12 0500) Last BM Date: 10/03/11  Weight change: Filed Weights   10/02/11 0500 10/03/11 0419 10/04/11 0500  Weight: 99.9 kg (220 lb 3.8 oz) 97.2 kg (214 lb 4.6 oz) 96.5 kg (212 lb 11.9 oz)    Intake/Output:   Intake/Output Summary (Last 24 hours) at 10/04/11 0925 Last data filed at 10/04/11 0900  Gross per 24 hour  Intake 1034.4 ml  Output   2805 ml  Net -1770.6 ml     Physical Exam: General:  Chronically ill  appearing. No resp difficulty HEENT: normal Neck: supple. JVP to ear. Carotids 2+ bilat; no  bruits. No lymphadenopathy or thryomegaly appreciated. Cor: PMI nondisplaced. Regular rate & rhythm. No rubs, gallops or murmurs. Lungs: clear 2 liters  Abdomen: obese, soft, nontender, nondistended. No hepatosplenomegaly. No bruits or masses. Good bowel sounds. Extremities: no cyanosis, clubbing, rash, edema RUE PICC Neuro: Flat affect.  alert & orientedx3, cranial nerves grossly intact. moves all 4 extremities w/o difficulty. Affect pleasant GU: Foley  Telemetry: SR   Labs: Basic Metabolic Panel:  Lab 10/04/11 1610 10/03/11 0421 10/02/11 0515 10/01/11 0912 09/30/11 0540  NA 139 141 143 144 145  K 4.3 4.2 4.5 4.3 4.9  CL 96 96 98 100 104  CO2 40* 38* 37* 39* 34*  GLUCOSE 106* 90 97 92 90  BUN 46* 45* 47* 47* 53*  CREATININE 2.15* 1.99* 1.94* 2.00* 2.05*  CALCIUM 8.7 8.9 8.8 -- --  MG -- -- -- -- --  PHOS -- -- -- -- --    Liver Function Tests:  Lab 09/28/11 1236  AST 15  ALT 19  ALKPHOS 51  BILITOT 0.2*  PROT 5.8*  ALBUMIN 2.5*   No results found for this basename: LIPASE:5,AMYLASE:5 in the last 168 hours No results found for this basename: AMMONIA:3 in the last 168 hours  CBC:  Lab 09/30/11 0540 09/29/11 0415 09/28/11 2019 09/28/11 1811 09/28/11 1236  WBC 7.7 8.2 8.4 8.0 7.9  NEUTROABS -- -- -- --  6.4  HGB 8.9* 9.0* 8.9* 8.9* 9.1*  HCT 30.9* 32.2* 31.1* 31.4* 31.9*  MCV 85.6 86.8 85.9 86.3 86.4  PLT 124* 119* 113* 113* 109*    Cardiac Enzymes:  Lab 09/29/11 1207 09/29/11 0415 09/28/11 2019  CKTOTAL 39 42 42  CKMB 3.7 3.8 3.8  CKMBINDEX -- -- --  TROPONINI <0.30 <0.30 <0.30    BNP: BNP (last 3 results)  Basename 10/02/11 0515 09/28/11 1237 09/08/11 0350  PROBNP 17696.0* 16109.6* 34331.0*     Other results:    Imaging: No results found.   Medications:     Scheduled Medications:    . albuterol  2.5 mg Nebulization TID  . aspirin EC  81 mg Oral Daily  . atorvastatin  10 mg Oral q1800  . carvedilol  18.75 mg Oral BID WC  . docusate  sodium  100 mg Oral BID  . furosemide  80 mg Intravenous Q8H  . heparin subcutaneous  5,000 Units Subcutaneous Q8H  . hydrALAZINE  100 mg Oral Q8H  . insulin aspart  0-9 Units Subcutaneous TID WC  . ipratropium  0.5 mg Nebulization TID  . isosorbide mononitrate  120 mg Oral Daily  . nicotine  21 mg Transdermal Daily  . paricalcitol  1 mcg Oral Daily  . sodium chloride  10-40 mL Intracatheter Q12H  . sodium chloride  3 mL Intravenous Q12H  . DISCONTD: furosemide  60 mg Intravenous Q8H  . DISCONTD: hydrALAZINE  75 mg Oral Q8H    Infusions:    . sodium chloride 10 mL/hr at 10/02/11 1800  . nesiritide (NATRECOR) infusion 0.01 mcg/kg/min (10/04/11 0051)    PRN Medications: acetaminophen, acetaminophen, albuterol, alum & mag hydroxide-simeth, guaiFENesin-dextromethorphan, HYDROmorphone (DILAUDID) injection, ondansetron (ZOFRAN) IV, ondansetron, oxyCODONE, sodium chloride, traZODone   Assessment:  1. A/C systolic heart failure  EF 25-30% 2. CKD Stage IV (baseline 2.2-2.5) 3. DM 4. HTN 5. Anemia 6. COPD 7. OSA 8. Metabolic alkalosis   Plan/Discussion:    Volume status improving greatly.  Weight down 32 pounds since admit. Remains hypertensive. Continue Nesiritide at 0.01 mcg. Would use PICC to follow CVP and co-ox. Will not start ace due renal insufficiency. Continue hydral/NTG. Start diamox for alkalosis.  She will eventually return to SNF. She will need close outpatient f/u with extensive HF education though it appears her insight into managing her HF is quite limited.   Length of Stay: 6 Arvilla Meres, MD 10/04/2011, 9:25 AM

## 2011-10-04 NOTE — Progress Notes (Signed)
Pt refuses to wear CPAP at night. Pt in no distress at this time. Pt encouraged to call if pt changes mind.

## 2011-10-05 DIAGNOSIS — I5023 Acute on chronic systolic (congestive) heart failure: Secondary | ICD-10-CM

## 2011-10-05 LAB — BASIC METABOLIC PANEL
CO2: 40 mEq/L (ref 19–32)
Chloride: 98 mEq/L (ref 96–112)
Creatinine, Ser: 2.29 mg/dL — ABNORMAL HIGH (ref 0.50–1.10)
Glucose, Bld: 148 mg/dL — ABNORMAL HIGH (ref 70–99)

## 2011-10-05 LAB — GLUCOSE, CAPILLARY
Glucose-Capillary: 108 mg/dL — ABNORMAL HIGH (ref 70–99)
Glucose-Capillary: 119 mg/dL — ABNORMAL HIGH (ref 70–99)

## 2011-10-05 LAB — CARBOXYHEMOGLOBIN
Carboxyhemoglobin: 2.3 % — ABNORMAL HIGH (ref 0.5–1.5)
O2 Saturation: 90.7 %
Total hemoglobin: 8.8 g/dL — ABNORMAL LOW (ref 12.0–16.0)

## 2011-10-05 MED ORDER — TORSEMIDE 20 MG PO TABS
20.0000 mg | ORAL_TABLET | Freq: Every day | ORAL | Status: DC
  Administered 2011-10-06: 20 mg via ORAL
  Filled 2011-10-05: qty 1

## 2011-10-05 NOTE — Progress Notes (Signed)
Advanced Heart Failure Rounding Note   Subjective:    [name] with past medical history significant for multiple medical problems including COPD, diabetes, congestive heart failure, CKD stage IV, OHS/OSA, and hypertension. She has not had LHC due to renal function. This is her 2 admission in 30 days.    09/13/11 RHC RA: 12/13/7  RV: 40/9/13  PCWP: 16  PA: 45/17 (28)  Cardiac Output  Thermodilution: 4.16 Index 1.97  Fick : 4.09 Index 1.94  Arterial Sat: 94% ( estimated from pulse oxymetry)  PA Sat: 52%.   09/06/11 ECHO EF 25-30%  09/07/11 Renal Ultrasound no stenosis  Discharged from [facility] 09/16/11 after being treated with IV lasix and nesiritide. Later transitioned to torsemide 20mg  BID. Discharge to SNF. Discharge weight 246 pounds.   Readmitted 09/28/11 due to dyspnea. Admit weight 246 pounds. Pro BNP 17,696 Diuresed with IV lasix. Day 7 of Nesiritide. Hydralazine titrated up to 75 mg tid and carvedilol tirated up to 18.75 mg Weight down 30 pounds.   Yesterday lasix increased to 80 mg TID. Creatinine trending up. Weight up 4 pounds but 24 hour I/O -2.3 liters.   Feels ok. Flat affect. Denies CP/PND/Orthopnea  Objective:   Weight Range:  Vital Signs:   Temp:  [98.2 F (36.8 C)-98.7 F (37.1 C)] 98.7 F (37.1 C) (08/13 0700) Pulse Rate:  [64-66] 66  (08/13 0700) Resp:  [17-20] 17  (08/13 0700) BP: (95-148)/(42-66) 119/52 mmHg (08/13 0900) SpO2:  [86 %-99 %] 97 % (08/13 0900) Weight:  [97.6 kg (215 lb 2.7 oz)-99.1 kg (218 lb 7.6 oz)] 97.6 kg (215 lb 2.7 oz) (08/13 0700) Last BM Date: 10/03/11  Weight change: Filed Weights   10/04/11 0500 10/05/11 0500 10/05/11 0700  Weight: 96.5 kg (212 lb 11.9 oz) 99.1 kg (218 lb 7.6 oz) 97.6 kg (215 lb 2.7 oz)    Intake/Output:   Intake/Output Summary (Last 24 hours) at 10/05/11 1159 Last data filed at 10/05/11 1000  Gross per 24 hour  Intake  838.4 ml  Output   2625 ml  Net -1786.6 ml     Physical Exam:    CVP 5    General:  Chronically ill  appearing. No resp difficulty HEENT: normal Neck: supple. JVP 6-7. Carotids 2+ bilat; no bruits. No lymphadenopathy or thryomegaly appreciated. Cor: PMI nondisplaced. Regular rate & rhythm. No rubs, gallops or murmurs. Lungs: clear 2 liters  Abdomen: obese, soft, nontender, nondistended. No hepatosplenomegaly. No bruits or masses. Good bowel sounds. Extremities: no cyanosis, clubbing, rash, edema RUE PICC Neuro: Flat affect.  alert & orientedx3, cranial nerves grossly intact. moves all 4 extremities w/o difficulty. Affect pleasant GU: Foley  Telemetry: SR   Labs: Basic Metabolic Panel:  Lab 10/05/11 1610 10/04/11 0439 10/03/11 0421 10/02/11 0515 10/01/11 0912  NA 141 139 141 143 144  K 4.4 4.3 4.2 4.5 4.3  CL 98 96 96 98 100  CO2 40* 40* 38* 37* 39*  GLUCOSE 148* 106* 90 97 92  BUN 48* 46* 45* 47* 47*  CREATININE 2.29* 2.15* 1.99* 1.94* 2.00*  CALCIUM 8.5 8.7 8.9 -- --  MG -- -- -- -- --  PHOS -- -- -- -- --    Liver Function Tests:  Lab 09/28/11 1236  AST 15  ALT 19  ALKPHOS 51  BILITOT 0.2*  PROT 5.8*  ALBUMIN 2.5*   No results found for this basename: LIPASE:5,AMYLASE:5 in the last 168 hours No results found for this basename: AMMONIA:3 in  the last 168 hours  CBC:  Lab 09/30/11 0540 09/29/11 0415 09/28/11 2019 09/28/11 1811 09/28/11 1236  WBC 7.7 8.2 8.4 8.0 7.9  NEUTROABS -- -- -- -- 6.4  HGB 8.9* 9.0* 8.9* 8.9* 9.1*  HCT 30.9* 32.2* 31.1* 31.4* 31.9*  MCV 85.6 86.8 85.9 86.3 86.4  PLT 124* 119* 113* 113* 109*    Cardiac Enzymes:  Lab 09/29/11 1207 09/29/11 0415 09/28/11 2019  CKTOTAL 39 42 42  CKMB 3.7 3.8 3.8  CKMBINDEX -- -- --  TROPONINI <0.30 <0.30 <0.30    BNP: BNP (last 3 results)  Basename 10/02/11 0515 09/28/11 1237 09/08/11 0350  PROBNP 17696.0* 16109.6* 34331.0*     Other results:    Imaging: No results found.   Medications:     Scheduled Medications:    . acetaZOLAMIDE  250 mg  Oral BID  . albuterol  2.5 mg Nebulization TID  . aspirin EC  81 mg Oral Daily  . atorvastatin  10 mg Oral q1800  . carvedilol  18.75 mg Oral BID WC  . docusate sodium  100 mg Oral BID  . heparin subcutaneous  5,000 Units Subcutaneous Q8H  . hydrALAZINE  75 mg Oral Q8H  . insulin aspart  0-9 Units Subcutaneous TID WC  . ipratropium  0.5 mg Nebulization TID  . isosorbide mononitrate  120 mg Oral Daily  . nicotine  21 mg Transdermal Daily  . paricalcitol  1 mcg Oral Daily  . sodium chloride  10-40 mL Intracatheter Q12H  . sodium chloride  3 mL Intravenous Q12H  . torsemide  20 mg Oral Daily  . DISCONTD: furosemide  80 mg Intravenous Q8H    Infusions:    . sodium chloride 10 mL/hr at 10/02/11 1800  . DISCONTD: nesiritide (NATRECOR) infusion 0.01 mcg/kg/min (10/04/11 2146)    PRN Medications: acetaminophen, acetaminophen, albuterol, alum & mag hydroxide-simeth, guaiFENesin-dextromethorphan, HYDROmorphone (DILAUDID) injection, ondansetron (ZOFRAN) IV, ondansetron, oxyCODONE, sodium chloride, traZODone   Assessment:  1. A/C systolic heart failure  EF 25-30% 2. CKD Stage IV (baseline 2.2-2.5) 3. DM 4. HTN 5. Anemia 6. COPD 7. OSA 8. Metabolic alkalosis   Plan/Discussion:    Volume status improving greatly. Appears euvolemic. CVP 5. Weight down 28 pounds since admit. Creatinine trending up. Hold IV lasix and start  Torsemide  20 mg daily.tomorrow.  Will stop Neseritide. Would use PICC to follow CVP and co-ox. Will not start ace due renal insufficiency.Ttitrate hydralazine if SBP trends up.   Continue diamox for alkalosis.   She will eventually return to SNF. She will need close outpatient f/u with extensive HF education though it appears her insight into managing her HF is quite limited.   Length of Stay: 7 Daniel Bensimhon,MD 12:01 PM

## 2011-10-05 NOTE — Progress Notes (Addendum)
Pt transferred from 2915 with foley catheter.  Insert foley catheter order was placed on September 28, 2011.  No new orders to keep foley cath since august 6.  Foley catheter dc on august 13,2013 at 1939 after pt stated that the foley was inserted here at the hospital on admission.  Pt told to push her call button when she feels the urge to void.  Will continue to monitor.

## 2011-10-05 NOTE — Progress Notes (Signed)
Physical Therapy Treatment Patient Details Name: LEATRICE PARILLA MRN: [id number] DOB: 1940/05/22 Today's Date: 10/05/2011 Time: 3086-5784 PT Time Calculation (min): 19 min  PT Assessment / Plan / Recommendation Comments on Treatment Session  Pt agreeable to OOB activity after increased time of encouragement.  Pt actually moves fairly well- mod I for bed mobility & Min guard for transfers.  Self-limiting as evident in refusing to perform sit<>stand again or attempt ambulation.      Follow Up Recommendations  Skilled nursing facility    Barriers to Discharge        Equipment Recommendations  Defer to next venue    Recommendations for Other Services    Frequency Min 2X/week   Plan Discharge plan remains appropriate    Precautions / Restrictions Precautions Precautions: Fall Restrictions Weight Bearing Restrictions: No    Mobility  Bed Mobility Bed Mobility: Supine to Sit;Sitting - Scoot to Edge of Bed Supine to Sit: 6: Modified independent (Device/Increase time);HOB elevated Sitting - Scoot to Edge of Bed: 6: Modified independent (Device/Increase time) Details for Bed Mobility Assistance: Increased time to encourage pt to participate but no physical assistance needed.   Transfers Transfers: Sit to Stand;Stand to [employer] Transfers Sit to Stand: 4: Min guard;With upper extremity assist;From bed Stand to Sit: 4: Min guard;With upper extremity assist;With armrests;To chair/3-in-1 Stand Pivot Transfers: 4: Min guard Details for Transfer Assistance: Guarding for safety.  Transfer appeared to be very minimally effortful for pt.   Ambulation/Gait Ambulation/Gait Assistance: Not tested (comment) Stairs: No Wheelchair Mobility Wheelchair Mobility: No      PT Goals Acute Rehab PT Goals Time For Goal Achievement: 10/13/11 Potential to Achieve Goals: Fair PT Goal: Supine/Side to Sit - Progress: Met PT Goal: Sit to Stand - Progress: Progressing toward goal PT Goal:  Stand to Sit - Progress: Progressing toward goal  Visit Information  Last PT Received On: 10/05/11 Assistance Needed: +1    Subjective Data  Subjective: "Now what do you want me to do"   Cognition  Overall Cognitive Status: Appears within functional limits for tasks assessed/performed Arousal/Alertness: Awake/alert Orientation Level: Appears intact for tasks assessed Behavior During Session: Flat affect    Balance  Balance Balance Assessed: No  End of Session PT - End of Session Equipment Utilized During Treatment: Oxygen Patient left: in chair;with call bell/phone within reach Nurse Communication: Mobility status     Verdell Face, [place] [phone] 10/05/2011

## 2011-10-06 LAB — BASIC METABOLIC PANEL
BUN: 50 mg/dL — ABNORMAL HIGH (ref 6–23)
CO2: 35 mEq/L — ABNORMAL HIGH (ref 19–32)
Calcium: 8.6 mg/dL (ref 8.4–10.5)
Chloride: 95 mEq/L — ABNORMAL LOW (ref 96–112)
Creatinine, Ser: 2.41 mg/dL — ABNORMAL HIGH (ref 0.50–1.10)
GFR calc Af Amer: 22 mL/min — ABNORMAL LOW (ref >90)

## 2011-10-06 LAB — GLUCOSE, CAPILLARY
Glucose-Capillary: 85 mg/dL (ref 70–99)
Glucose-Capillary: 100 mg/dL — ABNORMAL HIGH (ref 70–99)

## 2011-10-06 LAB — CARBOXYHEMOGLOBIN: Methemoglobin: 1.5 % (ref 0.0–1.5)

## 2011-10-06 NOTE — Progress Notes (Signed)
Clinical Social Worker spoke with patient's daughter, [name] and she is agreeable to CSW initiating bed search for patient. CSW will follow up with daughter, when bed offers are made.   Rozetta Nunnery MSW, [employer] [phone]

## 2011-10-06 NOTE — Progress Notes (Signed)
Daughter is very concerned of Mother going back to [facility] [address]  Wanted to make note that she really does not want the patient, her mother to return there if the proper care is not given. Consoled patient in letting her know that the SW/Case Mgr are aware of her feelings and will be able to f/u with her tomm when they meet with her. Daughter understood.

## 2011-10-06 NOTE — Progress Notes (Signed)
Cardiology Progress Note Patient Name: [name] Date of Encounter: 10/06/2011, 7:28 AM     Subjective  [name] with past medical history significant for multiple medical problems including COPD, diabetes, congestive heart failure, CKD stage IV, OHS/OSA, and hypertension. She has not had LHC due to renal function. This is her 2nd admission in 30 days.   09/13/11 RHC  RA: 12/13/7  RV: 40/9/13  PCWP: 16  PA: 45/17 (28)  Cardiac Output  Thermodilution: 4.16 Index 1.97  Fick : 4.09 Index 1.94  Arterial Sat: 94% ( estimated from pulse oxymetry)  PA Sat: 52%.   09/06/11 ECHO EF 25-30%  09/07/11 Renal Ultrasound no stenosis   Discharged from [facility] 09/16/11 after being treated with IV lasix and nesiritide. Later transitioned to torsemide 20mg  BID. Discharge to SNF. Discharge weight 246 pounds.   Readmitted 09/28/11 due to dyspnea. Admit weight 246 pounds. Pro BNP 17,696. Diuresed with IV lasix, held 8/13 due to worsening Crt. Nesiritide dc'd 8/12. Hydralazine titrated up to 75 mg tid and carvedilol tirated up to 18.75 mg. Torsemide 20mg  daily to start 8/14. Weight down 31 pounds.   Patient resting comfortably. Very quiet with flat affect. No complaints of sob or chest pain. Has been getting up to bedside commode and to chair.     Objective   Telemetry: Sinus rhythm 60-80s  Medications: . acetaZOLAMIDE  250 mg Oral BID  . albuterol  2.5 mg Nebulization TID  . aspirin EC  81 mg Oral Daily  . atorvastatin  10 mg Oral q1800  . carvedilol  18.75 mg Oral BID WC  . docusate sodium  100 mg Oral BID  . heparin subcutaneous  5,000 Units Subcutaneous Q8H  . hydrALAZINE  75 mg Oral Q8H  . insulin aspart  0-9 Units Subcutaneous TID WC  . ipratropium  0.5 mg Nebulization TID  . isosorbide mononitrate  120 mg Oral Daily  . nicotine  21 mg Transdermal Daily  . paricalcitol  1 mcg Oral Daily  . sodium chloride  10-40 mL Intracatheter Q12H  . sodium chloride  3 mL  Intravenous Q12H  . torsemide  20 mg Oral Daily  . DISCONTD: furosemide  80 mg Intravenous Q8H    Physical Exam: Temp:  [97.9 F (36.6 C)-99.2 F (37.3 C)] 99.2 F (37.3 C) (08/14 0545) Pulse Rate:  [66-84] 84  (08/14 0545) Resp:  [17-24] 24  (08/14 0545) BP: (114-155)/(43-72) 119/68 mmHg (08/14 0545) SpO2:  [91 %-100 %] 91 % (08/14 0545) Weight:  [215 lb 12.8 oz (97.886 kg)-217 lb 2.5 oz (98.5 kg)] 215 lb 12.8 oz (97.886 kg) (08/14 0618)  General: Chronically ill appearing obese black female, in no acute distress. Head: Normocephalic, atraumatic, sclera non-icteric, nares are without discharge.  Neck: Obese. Negative for carotid bruits. JVD difficult to assess Lungs: Clear bilaterally to auscultation without rales or rhonchi. Occasional scattered exp wheeze. Breathing is unlabored. Heart: RRR S1 S2 2/6 systolic murmur LSB without rubs, or gallops.  Abdomen: Obese, soft, non-tender, non-distended with normoactive bowel sounds. No rebound/guarding. No obvious abdominal masses. Msk:  Strength and tone appear normal for age. Extremities: RUE PICC. No edema. No clubbing or cyanosis. Distal pedal pulses are intact and equal bilaterally. Neuro: Alert and oriented X 3. Moves all extremities spontaneously. Psych:  Responds to questions appropriately with a flat affect.   Intake/Output Summary (Last 24 hours) at 10/06/11 0728 Last data filed at 10/05/11 1937  Gross per  24 hour  Intake  910.4 ml  Output   1550 ml  Net -639.6 ml    Labs:  Basename 10/06/11 0612 10/05/11 0400  NA 137 141  K 4.5 4.4  CL 95* 98  CO2 35* 40*  GLUCOSE 88 148*  BUN 50* 48*  CREATININE 2.41* 2.29*  CALCIUM 8.6 8.5  MG -- --  PHOS -- --    Total hemoglobin 9.0 (L) 8.8 (L) 8.4 (L)  Carboxyhemoglobin 2.5 (H) 2.3 (H) 2.5 (H)  Methemoglobin 1.3 1.3 1.5   Radiology/Studies:   09/28/2011 - Chest 2 View Findings: Chronic cardiomegaly.  Slight pulmonary vascular congestion.  Slight scarring in the left  midzone.  Abnormal increased density at the left base posterior laterally which could represent an area of infiltrate or loculated fluid or atelectasis secondary to the enlarged heart.  No definitive effusions.  No acute osseous abnormality.  Moderate arthritis of the left glenohumeral joint.  IMPRESSION:  1.  Abnormal increased density at the left base posterior laterally, increased since the prior exam.  This could represent an area of infiltrate or effusion or atelectasis. 2.  Chronic cardiomegaly. 3.  Slight pulmonary vascular prominence.   09/28/2011 - Ct Head Wo Contrast Findings: No acute intracranial hemorrhage.  No focal mass lesion. No CT evidence of acute infarction.   No midline shift or mass effect.  No hydrocephalus.  Basilar cisterns are patent.  There is a prominent CSF space anterior the frontal lobes unchanged from prior.  There is mild periventricular white matter hypodensities.  There is new fluid within the left mastoid air cells compared to prior.  Paranasal sinuses are clear.  IMPRESSION:  1.  No acute intracranial findings. 2.  New fluid in the left mastoid air cells.      Assessment and Plan  1. A/C systolic heart failure EF 25-30%  2. CKD Stage IV (baseline 2.2-2.5)  3. DM  4. HTN  5. Anemia  6. COPD  7. OSA  8. Metabolic alkalosis  Volume status significantly improved. Weight down 2 lbs from yesterday, down 31lbs from admission. I/Os (-) yesterday, (-)16.4L since admission. Euvolemic on exam. Lasix held yesterday due to rising creatinine. Crt 2.29 --> 2.41 this am. Torsemide 20mg  daily to start today. No ACEI 2/2 renal insufficiency. Metabolic alkalosis improving on diamox. Cont ASA, BB, hydralazine, Imdur, statin. She needs to ambulate today.  Disposition: She will eventually return to SNF. She will need close outpatient f/u with extensive HF education though it appears her insight into managing her HF is quite limited.    Signed, HOPE, JESSICA PA-C  Patient  seen and examined with [facility], PA-C. We discussed all aspects of the encounter. I agree with the assessment and plan as stated above.   Volume status much improved. Now appears dry. Would hold torsemide for now. Probably back to SNF either tomorrow or Friday depending on renal function.   Reuel Boom Bensimhon,MD 4:49 PM

## 2011-10-07 LAB — BASIC METABOLIC PANEL
BUN: 52 mg/dL — ABNORMAL HIGH (ref 6–23)
CO2: 35 mEq/L — ABNORMAL HIGH (ref 19–32)
Chloride: 98 mEq/L (ref 96–112)
Glucose, Bld: 95 mg/dL (ref 70–99)
Potassium: 4.4 mEq/L (ref 3.5–5.1)

## 2011-10-07 LAB — GLUCOSE, CAPILLARY: Glucose-Capillary: 96 mg/dL (ref 70–99)

## 2011-10-07 LAB — CARBOXYHEMOGLOBIN
Carboxyhemoglobin: 2.6 % — ABNORMAL HIGH (ref 0.5–1.5)
Methemoglobin: 1.2 % (ref 0.0–1.5)

## 2011-10-07 MED ORDER — HEPARIN SOD (PORK) LOCK FLUSH 100 UNIT/ML IV SOLN
500.0000 [IU] | Freq: Once | INTRAVENOUS | Status: AC
  Administered 2011-10-07: 500 [IU] via INTRAVENOUS

## 2011-10-07 MED ORDER — TORSEMIDE 20 MG PO TABS: 20.0000 mg | ORAL_TABLET | Freq: Every day | ORAL | Status: DC

## 2011-10-07 MED ORDER — HEPARIN SOD (PORK) LOCK FLUSH 100 UNIT/ML IV SOLN: 250.0000 [IU] | INTRAVENOUS | Status: DC | PRN

## 2011-10-07 MED ORDER — ISOSORBIDE MONONITRATE ER 120 MG PO TB24: 120.0000 mg | ORAL_TABLET | Freq: Every day | ORAL | Status: DC

## 2011-10-07 MED ORDER — TORSEMIDE 20 MG PO TABS
20.0000 mg | ORAL_TABLET | Freq: Every day | ORAL | Status: DC
  Administered 2011-10-07: 20 mg via ORAL
  Filled 2011-10-07: qty 1

## 2011-10-07 MED ORDER — HYDRALAZINE HCL 25 MG PO TABS: 75.0000 mg | ORAL_TABLET | Freq: Three times a day (TID) | ORAL | Status: DC

## 2011-10-07 NOTE — Progress Notes (Signed)
Talked to pt about wearing her cpap that was ordered. She did not want to wear it. Encouraged pt to call if she decided she wanted to try it.

## 2011-10-07 NOTE — Discharge Summary (Signed)
Advanced Heart Failure Team  Discharge Summary   Patient ID: [name] MRN: [id number], DOB/AGE: 10-02-1940 71 y.o. Admit date: 09/28/2011 D/C date:     10/07/2011   Primary Discharge Diagnoses:  1. A/C systolic heart failure EF 25-30%  2. CKD Stage IV (baseline 2.2-2.5)  3. DM  4. HTN  5. Anemia  6. COPD  7. OSA  8. Metabolic alkalosis   Hospital Course:  [name] with past medical history significant for multiple medical problems including COPD, diabetes, congestive heart failure, CKD stage IV, OHS/OSA, and hypertension. She has not had LHC due to renal function. This is her 2 admission in 30 days.   She was discharged from [facility] 09/16/11 after being treated with IV lasix and nesiritide. Later transitioned to torsemide 20mg  BID. Discharge to SNF. Discharge weight was 246 pounds.   RHC cath 7/22 prior to D/C:  RA: 12/13/7  RV: 40/9/13  PCWP: 16  PA: 45/17 (28)  Cardiac Output  Thermodilution: 4.16 Index 1.97  Fick : 4.09 Index 1.94  Arterial Sat: 94% PA Sat: 52%.  Readmitted 09/28/11 due to dyspnea and massive volume overload. Curiously given d/c weight and results of RHC prior to d/c her admit weight was still 246 pounds. Pro BNP 17,696. Neseritide started for afterload reduction. She received Neseritide for total of 7 days. Diuresed with IV lasix and transitioned to torsemide. Hydralazine and Isosorbide titrated up as well as Carvedilol tirated up to 18.75 mg. Received Diamox 250 mg for 3 days due to elevated CO2. CO2 reduced to 35. SBP improved throughout hospitalization. It should be noted she refused CPAP through hospitalization. Weight down 27 pounds. She diuresed a total of 17.5 liters. Creatinine baseline 2.0-2.2. D/C weight 219 pounds.  At the time of discharge she was deemed medically stable to skilled nursing facility for heart failure managment. SNF to weigh once admitted and give an extra torsemide if she gains 3 pounds in 24 hours. SNF to fax  weekly BMET and weights. She will be followed closely in the Heart Failure Clinic and by Dr Shirlee Latch.     Discharge Weight Range: Admit Wight: 246 pounds  Discharge Weight: 219 pounds Discharge Vitals: Blood pressure 163/51, pulse 77, temperature 98.2 F (36.8 C), temperature source Oral, resp. rate 20, height 5\' 1"  (1.549 m), weight 99.61 kg (219 lb 9.6 oz), SpO2 94.00%.  Labs: Lab Results  Component Value Date   WBC 7.7 09/30/2011   HGB 8.9* 09/30/2011   HCT 30.9* 09/30/2011   MCV 85.6 09/30/2011   PLT 124* 09/30/2011     Lab 10/07/11 0650  NA 139  K 4.4  CL 98  CO2 35*  BUN 52*  CREATININE 2.49*  CALCIUM 8.5  PROT --  BILITOT --  ALKPHOS --  ALT --  AST --  GLUCOSE 95   Lab Results  Component Value Date   CHOL 255* 09/07/2011   HDL 59 09/07/2011   LDLCALC 161* 09/07/2011   TRIG 177* 09/07/2011   BNP (last 3 results)  Basename 10/02/11 0515 09/28/11 1237 09/08/11 0350  PROBNP 17696.0* 24401.0* 34331.0*    Diagnostic Studies/Procedures   No results found.  Discharge Medications   Medication List  As of 10/07/2011  1:27 PM   STOP taking these medications         predniSONE 5 MG tablet         TAKE these medications         albuterol (5 MG/ML) 0.5% nebulizer  solution   Commonly known as: PROVENTIL   Take 0.5 mLs (2.5 mg total) by nebulization 3 (three) times daily.      aspirin EC 81 MG tablet   Take 81 mg by mouth daily.      atorvastatin 10 MG tablet   Commonly known as: LIPITOR   Take 10 mg by mouth every evening.      carvedilol 12.5 MG tablet   Commonly known as: COREG   Take 12.5 mg by mouth 2 (two) times daily with a meal.      hydrALAZINE 25 MG tablet   Commonly known as: APRESOLINE   Take 3 tablets (75 mg total) by mouth every 8 (eight) hours.      insulin aspart 100 UNIT/ML injection   Commonly known as: novoLOG   Inject 0-15 Units into the skin 3 (three) times daily with meals.      ipratropium 0.02 % nebulizer solution   Commonly known as:  ATROVENT   Take 2.5 mLs (0.5 mg total) by nebulization 3 (three) times daily.      isosorbide mononitrate 120 MG 24 hr tablet   Commonly known as: IMDUR   Take 1 tablet (120 mg total) by mouth daily.      nicotine 21 mg/24hr patch   Commonly known as: NICODERM CQ - dosed in mg/24 hours   Place 1 patch onto the skin daily.      paricalcitol 1 MCG capsule   Commonly known as: ZEMPLAR   Take 1 mcg by mouth daily.      torsemide 20 MG tablet   Commonly known as: DEMADEX   Take 1 tablet (20 mg total) by mouth daily.            Disposition   The patient will be discharged in stable condition to home. Discharge Orders    Future Appointments: Provider: Department: Dept Phone: Center:   10/13/2011 10:30 AM [facility] [phone] None   10/28/2011 2:45 PM Laurey Morale, MD [facility] [phone] [facility]     Future Orders Please Complete By Expires   Diet - low sodium heart healthy      Increase activity slowly      (HEART FAILURE PATIENTS) Call MD:  Anytime you have any of the following symptoms: 1) 3 pound weight gain in 24 hours or 5 pounds in 1 week 2) shortness of breath, with or without a dry hacking cough 3) swelling in the hands, feet or stomach 4) if you have to sleep on extra pillows at night in order to breathe.      Heart Failure patients record your daily weight using the same scale at the same time of day      Contraindication to ACEI at discharge      Comments:   Due to renal insufficiency     Follow-up Information    Follow up with [name], MD on 10/28/2011. (2:45)    Contact information:   [address] [place] [address] [phone]       Follow up with Arvilla Meres, MD on 10/13/2011. (at 10:30)    Contact information:   [address] [place] [address] [phone]            Duration of Discharge Encounter:  Greater than 35 minutes   Signed, Arvilla Meres , MD 10/07/2011, 1:27 PM

## 2011-10-07 NOTE — Progress Notes (Addendum)
Clinical Social Worker spoke with patient daughter, [name] regarding the three bed offers received. [name] requested CSW to call her back around 10am today. CSW will continue to follow.   12:07pm CSW attempted to call daughter twice on her cell phone and her work number [phone]). CSW left a voice message again for confirmation on new facility for placement. CSW will continue to follow.   Rozetta Nunnery MSW, [employer] [phone]

## 2011-10-07 NOTE — Progress Notes (Signed)
Clinical Social Worker was able to speak with daughter, [name] and patient regarding discharge today. Daughter is agreeable to patient discharging back to [facility]. CSW will send discharge summary to facility. CSW will facilitate discharge and patient will be transported via [employer]. Patient's discharge packet will be placed in designated room number at nurses station.   Rozetta Nunnery MSW, [employer] [phone]

## 2011-10-07 NOTE — Progress Notes (Signed)
Patient transferred to [facility] (SNF) by stretcher via EMT. Report has been given to nurse at facility and had no further questions. All belongings traveled with patient to SNF.

## 2011-10-07 NOTE — Progress Notes (Signed)
Advanced Heart Failure Rounding Note   Subjective:    [name] with past medical history significant for multiple medical problems including COPD, diabetes, congestive heart failure, CKD stage IV, OHS/OSA, and hypertension. She has not had LHC due to renal function. This is her 2 admission in 30 days.    09/13/11 RHC RA: 12/13/7  RV: 40/9/13  PCWP: 16  PA: 45/17 (28)  Cardiac Output  Thermodilution: 4.16 Index 1.97  Fick : 4.09 Index 1.94  Arterial Sat: 94% ( estimated from pulse oxymetry)  PA Sat: 52%.   Readmitted 09/28/11 due to dyspnea. Admit weight 246 pounds. Pro BNP 17,696.  Diuresed with IV lasix. Received 7 days of Nesiritide. Hydralazine titrated up to 75 mg tid and carvedilol tirated up to 18.75 mg Weight down 27 pounds.   Diuretics reduced due to creatinine bump. Personally checked BP manually 138/60.   Feels ok. Complains of difficulty hearing.  Flat affect. Denies CP/PND/Orthopnea  Objective:   Weight Range:  Vital Signs:   Temp:  [98.2 F (36.8 C)-98.9 F (37.2 C)] 98.2 F (36.8 C) (08/15 0446) Pulse Rate:  [65-77] 77  (08/15 0446) Resp:  [20-22] 20  (08/15 0446) BP: (128-163)/(51-61) 163/51 mmHg (08/15 0446) SpO2:  [94 %-99 %] 94 % (08/15 0852) Weight:  [99.61 kg (219 lb 9.6 oz)] 99.61 kg (219 lb 9.6 oz) (08/15 0446) Last BM Date: 10/04/11  Weight change: Filed Weights   10/05/11 1857 10/06/11 0618 10/07/11 0446  Weight: 98.5 kg (217 lb 2.5 oz) 97.886 kg (215 lb 12.8 oz) 99.61 kg (219 lb 9.6 oz)    Intake/Output:   Intake/Output Summary (Last 24 hours) at 10/07/11 1307 Last data filed at 10/07/11 1052  Gross per 24 hour  Intake    240 ml  Output   1850 ml  Net  -1610 ml     Physical Exam:     General:  Chronically ill  appearing. No resp difficulty In chair HEENT: normal Neck: supple. JVP 6-7. Carotids 2+ bilat; no bruits. No lymphadenopathy or thryomegaly appreciated. Cor: PMI nondisplaced. Regular rate & rhythm. No  rubs, gallops or murmurs. Lungs: clear 2 liters  Abdomen: obese, soft, nontender, nondistended. No hepatosplenomegaly. No bruits or masses. Good bowel sounds. Extremities: no cyanosis, clubbing, rash, edema RUE PICC Neuro: Flat affect.  alert & orientedx3, cranial nerves grossly intact. moves all 4 extremities w/o difficulty. Affect pleasant  Telemetry: SR   Labs: Basic Metabolic Panel:  Lab 10/07/11 1610 10/06/11 0612 10/05/11 0400 10/04/11 0439 10/03/11 0421  NA 139 137 141 139 141  K 4.4 4.5 4.4 4.3 4.2  CL 98 95* 98 96 96  CO2 35* 35* 40* 40* 38*  GLUCOSE 95 88 148* 106* 90  BUN 52* 50* 48* 46* 45*  CREATININE 2.49* 2.41* 2.29* 2.15* 1.99*  CALCIUM 8.5 8.6 8.5 -- --  MG -- -- -- -- --  PHOS -- -- -- -- --    Liver Function Tests: No results found for this basename: AST:5,ALT:5,ALKPHOS:5,BILITOT:5,PROT:5,ALBUMIN:5 in the last 168 hours No results found for this basename: LIPASE:5,AMYLASE:5 in the last 168 hours No results found for this basename: AMMONIA:3 in the last 168 hours  CBC: No results found for this basename: WBC:5,NEUTROABS:5,HGB:5,HCT:5,MCV:5,PLT:5 in the last 168 hours  Cardiac Enzymes: No results found for this basename: CKTOTAL:5,CKMB:5,CKMBINDEX:5,TROPONINI:5 in the last 168 hours  BNP: BNP (last 3 results)  Basename 10/02/11 0515 09/28/11 1237 09/08/11 0350  PROBNP 17696.0* 96045.4* 34331.0*     Other  results:    Imaging: No results found.   Medications:     Scheduled Medications:    . acetaZOLAMIDE  250 mg Oral BID  . albuterol  2.5 mg Nebulization TID  . aspirin EC  81 mg Oral Daily  . atorvastatin  10 mg Oral q1800  . carvedilol  18.75 mg Oral BID WC  . docusate sodium  100 mg Oral BID  . heparin subcutaneous  5,000 Units Subcutaneous Q8H  . hydrALAZINE  75 mg Oral Q8H  . insulin aspart  0-9 Units Subcutaneous TID WC  . ipratropium  0.5 mg Nebulization TID  . isosorbide mononitrate  120 mg Oral Daily  . nicotine  21 mg  Transdermal Daily  . paricalcitol  1 mcg Oral Daily  . sodium chloride  10-40 mL Intracatheter Q12H  . sodium chloride  3 mL Intravenous Q12H  . torsemide  20 mg Oral Daily    Infusions:    PRN Medications: acetaminophen, acetaminophen, albuterol, alum & mag hydroxide-simeth, guaiFENesin-dextromethorphan, HYDROmorphone (DILAUDID) injection, ondansetron (ZOFRAN) IV, ondansetron, oxyCODONE, sodium chloride, traZODone   Assessment:  1. A/C systolic heart failure  EF 25-30% 2. CKD Stage IV (baseline 2.2-2.5) 3. DM 4. HTN 5. Anemia 6. COPD 7. OSA 8. Metabolic alkalosis   Plan/Discussion:    Volume status and sx much improved. Renal function trending up slightly. SOn admit she was on torsemide 20 bid. Will plan to d/c on 20 daily but suspect she may quickly begin to trend up and we will need to titrate dose of demadex frequently - watching renal function and weight closely.  We have provided explicit instructions to SNF regarding sliding scale diuretics and we will see her next week to f/u with her volume status.  Will stop Diamox today. Likely d/c to SNF today. I don not think she has enough insight into her HF to care for herself out home without significant support.    Length of Stay: 9  Dae Antonucci,MD 1:11 PM

## 2011-10-13 ENCOUNTER — Ambulatory Visit (HOSPITAL_COMMUNITY): Payer: Medicare Other | Attending: Internal Medicine

## 2011-10-22 ENCOUNTER — Encounter: Payer: Self-pay | Admitting: *Deleted

## 2011-10-28 ENCOUNTER — Encounter: Payer: Medicare Other | Admitting: Cardiology

## 2011-11-14 ENCOUNTER — Emergency Department (HOSPITAL_COMMUNITY): Payer: Medicare Other

## 2011-11-14 ENCOUNTER — Inpatient Hospital Stay (HOSPITAL_COMMUNITY): Payer: Medicare Other

## 2011-11-14 ENCOUNTER — Inpatient Hospital Stay (HOSPITAL_COMMUNITY)
Admission: EM | Admit: 2011-11-14 | Discharge: 2011-11-25 | DRG: 004 | Disposition: A | Discharge status: Skilled Nursing Facility | Payer: Medicare Other | Attending: Emergency Medicine | Admitting: Emergency Medicine

## 2011-11-14 ENCOUNTER — Encounter (HOSPITAL_COMMUNITY): Payer: Self-pay | Admitting: Emergency Medicine

## 2011-11-14 DIAGNOSIS — R627 Adult failure to thrive: Secondary | ICD-10-CM | POA: Diagnosis present

## 2011-11-14 DIAGNOSIS — I452 Bifascicular block: Secondary | ICD-10-CM | POA: Diagnosis present

## 2011-11-14 DIAGNOSIS — I509 Heart failure, unspecified: Secondary | ICD-10-CM

## 2011-11-14 DIAGNOSIS — J449 Chronic obstructive pulmonary disease, unspecified: Secondary | ICD-10-CM | POA: Diagnosis present

## 2011-11-14 DIAGNOSIS — I429 Cardiomyopathy, unspecified: Secondary | ICD-10-CM

## 2011-11-14 DIAGNOSIS — N179 Acute kidney failure, unspecified: Secondary | ICD-10-CM

## 2011-11-14 DIAGNOSIS — N184 Chronic kidney disease, stage 4 (severe): Secondary | ICD-10-CM | POA: Diagnosis present

## 2011-11-14 DIAGNOSIS — G4733 Obstructive sleep apnea (adult) (pediatric): Secondary | ICD-10-CM

## 2011-11-14 DIAGNOSIS — R0609 Other forms of dyspnea: Secondary | ICD-10-CM

## 2011-11-14 DIAGNOSIS — I798 Other disorders of arteries, arterioles and capillaries in diseases classified elsewhere: Secondary | ICD-10-CM | POA: Diagnosis present

## 2011-11-14 DIAGNOSIS — M7989 Other specified soft tissue disorders: Secondary | ICD-10-CM

## 2011-11-14 DIAGNOSIS — M109 Gout, unspecified: Secondary | ICD-10-CM | POA: Diagnosis present

## 2011-11-14 DIAGNOSIS — J45909 Unspecified asthma, uncomplicated: Secondary | ICD-10-CM | POA: Diagnosis present

## 2011-11-14 DIAGNOSIS — F17200 Nicotine dependence, unspecified, uncomplicated: Secondary | ICD-10-CM

## 2011-11-14 DIAGNOSIS — D649 Anemia, unspecified: Secondary | ICD-10-CM | POA: Diagnosis present

## 2011-11-14 DIAGNOSIS — J9600 Acute respiratory failure, unspecified whether with hypoxia or hypercapnia: Secondary | ICD-10-CM

## 2011-11-14 DIAGNOSIS — E870 Hyperosmolality and hypernatremia: Secondary | ICD-10-CM | POA: Diagnosis present

## 2011-11-14 DIAGNOSIS — R0989 Other specified symptoms and signs involving the circulatory and respiratory systems: Secondary | ICD-10-CM

## 2011-11-14 DIAGNOSIS — Z9981 Dependence on supplemental oxygen: Secondary | ICD-10-CM

## 2011-11-14 DIAGNOSIS — J4489 Other specified chronic obstructive pulmonary disease: Secondary | ICD-10-CM | POA: Diagnosis present

## 2011-11-14 DIAGNOSIS — J9620 Acute and chronic respiratory failure, unspecified whether with hypoxia or hypercapnia: Principal | ICD-10-CM | POA: Diagnosis present

## 2011-11-14 DIAGNOSIS — I279 Pulmonary heart disease, unspecified: Secondary | ICD-10-CM | POA: Diagnosis present

## 2011-11-14 DIAGNOSIS — I428 Other cardiomyopathies: Secondary | ICD-10-CM

## 2011-11-14 DIAGNOSIS — I130 Hypertensive heart and chronic kidney disease with heart failure and stage 1 through stage 4 chronic kidney disease, or unspecified chronic kidney disease: Secondary | ICD-10-CM | POA: Diagnosis present

## 2011-11-14 DIAGNOSIS — R0689 Other abnormalities of breathing: Secondary | ICD-10-CM

## 2011-11-14 DIAGNOSIS — J9602 Acute respiratory failure with hypercapnia: Secondary | ICD-10-CM

## 2011-11-14 DIAGNOSIS — I5042 Chronic combined systolic (congestive) and diastolic (congestive) heart failure: Secondary | ICD-10-CM | POA: Diagnosis present

## 2011-11-14 DIAGNOSIS — E669 Obesity, unspecified: Secondary | ICD-10-CM | POA: Diagnosis present

## 2011-11-14 DIAGNOSIS — I5043 Acute on chronic combined systolic (congestive) and diastolic (congestive) heart failure: Secondary | ICD-10-CM | POA: Diagnosis present

## 2011-11-14 DIAGNOSIS — E662 Morbid (severe) obesity with alveolar hypoventilation: Secondary | ICD-10-CM | POA: Diagnosis present

## 2011-11-14 DIAGNOSIS — Z9119 Patient's noncompliance with other medical treatment and regimen: Secondary | ICD-10-CM

## 2011-11-14 DIAGNOSIS — E1159 Type 2 diabetes mellitus with other circulatory complications: Secondary | ICD-10-CM | POA: Diagnosis present

## 2011-11-14 DIAGNOSIS — N189 Chronic kidney disease, unspecified: Secondary | ICD-10-CM | POA: Diagnosis present

## 2011-11-14 DIAGNOSIS — Z91199 Patient's noncompliance with other medical treatment and regimen due to unspecified reason: Secondary | ICD-10-CM

## 2011-11-14 DIAGNOSIS — I110 Hypertensive heart disease with heart failure: Secondary | ICD-10-CM

## 2011-11-14 DIAGNOSIS — I5021 Acute systolic (congestive) heart failure: Secondary | ICD-10-CM

## 2011-11-14 DIAGNOSIS — R0603 Acute respiratory distress: Secondary | ICD-10-CM

## 2011-11-14 DIAGNOSIS — E785 Hyperlipidemia, unspecified: Secondary | ICD-10-CM | POA: Diagnosis present

## 2011-11-14 DIAGNOSIS — I2789 Other specified pulmonary heart diseases: Secondary | ICD-10-CM | POA: Diagnosis present

## 2011-11-14 DIAGNOSIS — E876 Hypokalemia: Secondary | ICD-10-CM | POA: Diagnosis present

## 2011-11-14 DIAGNOSIS — I2510 Atherosclerotic heart disease of native coronary artery without angina pectoris: Secondary | ICD-10-CM | POA: Diagnosis present

## 2011-11-14 DIAGNOSIS — I5023 Acute on chronic systolic (congestive) heart failure: Secondary | ICD-10-CM

## 2011-11-14 HISTORY — DX: Unspecified disorder of circulatory system: I99.9

## 2011-11-14 HISTORY — DX: Unspecified asthma, uncomplicated: J45.909

## 2011-11-14 HISTORY — DX: Anemia, unspecified: D64.9

## 2011-11-14 LAB — POCT I-STAT 3, ART BLOOD GAS (G3+)
Acid-Base Excess: 11 mmol/L — ABNORMAL HIGH (ref 0.0–2.0)
Bicarbonate: 36.3 mEq/L — ABNORMAL HIGH (ref 20.0–24.0)
O2 Saturation: 100 %
Patient temperature: 98.3
TCO2: 38 mmol/L (ref 0–100)
pCO2 arterial: 48.6 mmHg — ABNORMAL HIGH (ref 35.0–45.0)
pH, Arterial: 7.481 — ABNORMAL HIGH (ref 7.350–7.450)
pO2, Arterial: 223 mmHg — ABNORMAL HIGH (ref 80.0–100.0)

## 2011-11-14 LAB — COMPREHENSIVE METABOLIC PANEL
AST: 13 U/L (ref 0–37)
CO2: 35 mEq/L — ABNORMAL HIGH (ref 19–32)
Calcium: 9.3 mg/dL (ref 8.4–10.5)
Creatinine, Ser: 2.61 mg/dL — ABNORMAL HIGH (ref 0.50–1.10)
GFR calc Af Amer: 20 mL/min — ABNORMAL LOW (ref >90)
GFR calc non Af Amer: 17 mL/min — ABNORMAL LOW (ref >90)

## 2011-11-14 LAB — CBC WITH DIFFERENTIAL/PLATELET
Basophils Absolute: 0 10*3/uL (ref 0.0–0.1)
Basophils Relative: 0 % (ref 0–1)
Eosinophils Absolute: 0 10*3/uL (ref 0.0–0.7)
Eosinophils Relative: 0 % (ref 0–5)
HCT: 37.2 % (ref 36.0–46.0)
Hemoglobin: 10.3 g/dL — ABNORMAL LOW (ref 12.0–15.0)
Lymphocytes Relative: 18 % (ref 12–46)
Lymphs Abs: 1.7 K/uL (ref 0.7–4.0)
MCH: 25.2 pg — ABNORMAL LOW (ref 26.0–34.0)
MCHC: 27.7 g/dL — ABNORMAL LOW (ref 30.0–36.0)
MCV: 91 fL (ref 78.0–100.0)
Monocytes Absolute: 0.7 10*3/uL (ref 0.1–1.0)
Monocytes Relative: 8 % (ref 3–12)
Neutro Abs: 6.7 K/uL (ref 1.7–7.7)
Neutrophils Relative %: 74 % (ref 43–77)
Platelets: 138 K/uL — ABNORMAL LOW (ref 150–400)
RBC: 4.09 MIL/uL (ref 3.87–5.11)
RDW: 16.7 % — ABNORMAL HIGH (ref 11.5–15.5)
WBC: 9.1 K/uL (ref 4.0–10.5)

## 2011-11-14 LAB — URINALYSIS, ROUTINE W REFLEX MICROSCOPIC
Bilirubin Urine: NEGATIVE
Glucose, UA: NEGATIVE mg/dL
Ketones, ur: NEGATIVE mg/dL
Leukocytes, UA: NEGATIVE
Nitrite: NEGATIVE
Protein, ur: >300 mg/dL — AB
Specific Gravity, Urine: 1.019 (ref 1.005–1.030)
Urobilinogen, UA: 1 mg/dL (ref 0.0–1.0)
pH: 5.5 (ref 5.0–8.0)

## 2011-11-14 LAB — GLUCOSE, CAPILLARY
Glucose-Capillary: 125 mg/dL — ABNORMAL HIGH (ref 70–99)
Glucose-Capillary: 74 mg/dL (ref 70–99)

## 2011-11-14 LAB — PROTIME-INR
INR: 1.04 (ref 0.00–1.49)
Prothrombin Time: 13.5 s (ref 11.6–15.2)

## 2011-11-14 LAB — PRO B NATRIURETIC PEPTIDE: Pro B Natriuretic peptide (BNP): 45375 pg/mL — ABNORMAL HIGH (ref 0–125)

## 2011-11-14 LAB — COMPREHENSIVE METABOLIC PANEL WITH GFR
ALT: 9 U/L (ref 0–35)
Albumin: 3.4 g/dL — ABNORMAL LOW (ref 3.5–5.2)
Alkaline Phosphatase: 58 U/L (ref 39–117)
BUN: 41 mg/dL — ABNORMAL HIGH (ref 6–23)
Chloride: 101 meq/L (ref 96–112)
Glucose, Bld: 111 mg/dL — ABNORMAL HIGH (ref 70–99)
Potassium: 4.7 meq/L (ref 3.5–5.1)
Sodium: 143 meq/L (ref 135–145)
Total Bilirubin: 0.2 mg/dL — ABNORMAL LOW (ref 0.3–1.2)
Total Protein: 7 g/dL (ref 6.0–8.3)

## 2011-11-14 LAB — URINE MICROSCOPIC-ADD ON

## 2011-11-14 LAB — MRSA PCR SCREENING: MRSA by PCR: NEGATIVE

## 2011-11-14 LAB — APTT: aPTT: 28 seconds (ref 24–37)

## 2011-11-14 MED ORDER — TORSEMIDE 20 MG PO TABS: 20.0000 mg | ORAL_TABLET | Freq: Every day | ORAL | Status: DC

## 2011-11-14 MED ORDER — CHLORHEXIDINE GLUCONATE 0.12 % MT SOLN
OROMUCOSAL | Status: AC
  Administered 2011-11-14: 15 mL
  Filled 2011-11-14: qty 15

## 2011-11-14 MED ORDER — LABETALOL HCL 5 MG/ML IV SOLN: 20.0000 mg | INTRAVENOUS | Status: DC | PRN

## 2011-11-14 MED ORDER — ALBUTEROL SULFATE (5 MG/ML) 0.5% IN NEBU: 5.0000 mg | INHALATION_SOLUTION | Freq: Once | RESPIRATORY_TRACT | Status: DC

## 2011-11-14 MED ORDER — BIOTENE DRY MOUTH MT LIQD
15.0000 mL | Freq: Four times a day (QID) | OROMUCOSAL | Status: DC
  Administered 2011-11-15 – 2011-11-25 (×43): 15 mL via OROMUCOSAL

## 2011-11-14 MED ORDER — NICOTINE 21 MG/24HR TD PT24
21.0000 mg | MEDICATED_PATCH | TRANSDERMAL | Status: DC
  Administered 2011-11-14 – 2011-11-17 (×4): 21 mg via TRANSDERMAL
  Filled 2011-11-14 (×5): qty 1

## 2011-11-14 MED ORDER — ASPIRIN EC 81 MG PO TBEC
81.0000 mg | DELAYED_RELEASE_TABLET | Freq: Every day | ORAL | Status: DC
  Administered 2011-11-15: 81 mg via ORAL
  Filled 2011-11-14 (×2): qty 1

## 2011-11-14 MED ORDER — ETOMIDATE 2 MG/ML IV SOLN
20.0000 mg | Freq: Once | INTRAVENOUS | Status: AC
  Administered 2011-11-14: 20 mg via INTRAVENOUS

## 2011-11-14 MED ORDER — FENTANYL CITRATE 0.05 MG/ML IJ SOLN
100.0000 ug | Freq: Once | INTRAMUSCULAR | Status: AC
  Administered 2011-11-14: 100 ug via INTRAVENOUS

## 2011-11-14 MED ORDER — PARICALCITOL 1 MCG PO CAPS
1.0000 ug | ORAL_CAPSULE | Freq: Every day | ORAL | Status: DC
  Administered 2011-11-15 – 2011-11-22 (×5): 1 ug via ORAL
  Filled 2011-11-14 (×10): qty 1

## 2011-11-14 MED ORDER — ALBUTEROL SULFATE HFA 108 (90 BASE) MCG/ACT IN AERS: 6.0000 | INHALATION_SPRAY | RESPIRATORY_TRACT | Status: DC | PRN

## 2011-11-14 MED ORDER — CARVEDILOL 12.5 MG PO TABS
12.5000 mg | ORAL_TABLET | Freq: Two times a day (BID) | ORAL | Status: DC
  Administered 2011-11-14 – 2011-11-25 (×16): 12.5 mg via ORAL
  Filled 2011-11-14 (×25): qty 1

## 2011-11-14 MED ORDER — PANTOPRAZOLE SODIUM 40 MG IV SOLR
40.0000 mg | Freq: Every day | INTRAVENOUS | Status: DC
  Administered 2011-11-14 – 2011-11-15 (×2): 40 mg via INTRAVENOUS
  Filled 2011-11-14 (×5): qty 40

## 2011-11-14 MED ORDER — FUROSEMIDE 10 MG/ML IJ SOLN
80.0000 mg | Freq: Once | INTRAMUSCULAR | Status: AC
  Administered 2011-11-14: 80 mg via INTRAVENOUS
  Filled 2011-11-14: qty 8

## 2011-11-14 MED ORDER — IPRATROPIUM BROMIDE 0.02 % IN SOLN: 0.5000 mg | Freq: Once | RESPIRATORY_TRACT | Status: DC

## 2011-11-14 MED ORDER — ATORVASTATIN CALCIUM 10 MG PO TABS
10.0000 mg | ORAL_TABLET | Freq: Every evening | ORAL | Status: DC
  Administered 2011-11-14 – 2011-11-24 (×7): 10 mg via ORAL
  Filled 2011-11-14 (×12): qty 1

## 2011-11-14 MED ORDER — LABETALOL HCL 5 MG/ML IV SOLN
10.0000 mg | INTRAVENOUS | Status: DC | PRN
  Administered 2011-11-14 – 2011-11-24 (×7): 10 mg via INTRAVENOUS
  Filled 2011-11-14 (×7): qty 4

## 2011-11-14 MED ORDER — MIDAZOLAM HCL 2 MG/2ML IJ SOLN
INTRAMUSCULAR | Status: AC
  Administered 2011-11-14: 2 mg via INTRAVENOUS
  Filled 2011-11-14: qty 4

## 2011-11-14 MED ORDER — FENTANYL CITRATE 0.05 MG/ML IJ SOLN
INTRAMUSCULAR | Status: AC
  Administered 2011-11-14: 100 ug via INTRAVENOUS
  Filled 2011-11-14: qty 2

## 2011-11-14 MED ORDER — ROCURONIUM BROMIDE 50 MG/5ML IV SOLN
INTRAVENOUS | Status: AC
  Filled 2011-11-14: qty 2

## 2011-11-14 MED ORDER — HEPARIN SODIUM (PORCINE) 5000 UNIT/ML IJ SOLN
5000.0000 [IU] | Freq: Three times a day (TID) | INTRAMUSCULAR | Status: DC
  Administered 2011-11-14 – 2011-11-25 (×33): 5000 [IU] via SUBCUTANEOUS
  Filled 2011-11-14 (×36): qty 1

## 2011-11-14 MED ORDER — ETOMIDATE 2 MG/ML IV SOLN
INTRAVENOUS | Status: AC
  Administered 2011-11-14: 20 mg via INTRAVENOUS
  Filled 2011-11-14: qty 10

## 2011-11-14 MED ORDER — FUROSEMIDE 10 MG/ML IJ SOLN
40.0000 mg | Freq: Two times a day (BID) | INTRAMUSCULAR | Status: DC
  Administered 2011-11-14 – 2011-11-21 (×14): 40 mg via INTRAVENOUS
  Filled 2011-11-14 (×17): qty 4

## 2011-11-14 MED ORDER — MIDAZOLAM HCL 2 MG/2ML IJ SOLN
2.0000 mg | Freq: Once | INTRAMUSCULAR | Status: AC
  Administered 2011-11-14: 2 mg via INTRAVENOUS

## 2011-11-14 MED ORDER — ETOMIDATE 2 MG/ML IV SOLN
INTRAVENOUS | Status: AC
  Filled 2011-11-14: qty 20

## 2011-11-14 MED ORDER — MIDAZOLAM HCL 2 MG/2ML IJ SOLN
1.0000 mg | INTRAMUSCULAR | Status: DC | PRN
  Administered 2011-11-14 – 2011-11-17 (×3): 2 mg via INTRAVENOUS
  Administered 2011-11-19: 4 mg via INTRAVENOUS
  Filled 2011-11-14 (×2): qty 2

## 2011-11-14 MED ORDER — FENTANYL CITRATE 0.05 MG/ML IJ SOLN
25.0000 ug | INTRAMUSCULAR | Status: DC | PRN
  Administered 2011-11-14 – 2011-11-17 (×2): 50 ug via INTRAVENOUS
  Administered 2011-11-18 (×2): 25 ug via INTRAVENOUS
  Administered 2011-11-19 – 2011-11-20 (×5): 50 ug via INTRAVENOUS
  Administered 2011-11-20: 25 ug via INTRAVENOUS
  Administered 2011-11-22 (×5): 50 ug via INTRAVENOUS
  Filled 2011-11-14 (×2): qty 2
  Filled 2011-11-14: qty 4
  Filled 2011-11-14 (×11): qty 2

## 2011-11-14 MED ORDER — ALBUTEROL SULFATE (5 MG/ML) 0.5% IN NEBU
INHALATION_SOLUTION | RESPIRATORY_TRACT | Status: AC
  Administered 2011-11-14: 12:00:00
  Filled 2011-11-14: qty 1

## 2011-11-14 MED ORDER — SUCCINYLCHOLINE CHLORIDE 20 MG/ML IJ SOLN
INTRAMUSCULAR | Status: AC
  Filled 2011-11-14: qty 10

## 2011-11-14 MED ORDER — CHLORHEXIDINE GLUCONATE 0.12 % MT SOLN
15.0000 mL | Freq: Two times a day (BID) | OROMUCOSAL | Status: DC
  Administered 2011-11-15 – 2011-11-25 (×20): 15 mL via OROMUCOSAL
  Filled 2011-11-14 (×23): qty 15

## 2011-11-14 MED ORDER — NITROGLYCERIN 2 % TD OINT
1.0000 [in_us] | TOPICAL_OINTMENT | Freq: Four times a day (QID) | TRANSDERMAL | Status: DC | PRN
  Administered 2011-11-14: 1 [in_us] via TOPICAL
  Filled 2011-11-14: qty 30

## 2011-11-14 MED ORDER — PROPOFOL 10 MG/ML IV EMUL
5.0000 ug/kg/min | INTRAVENOUS | Status: DC
  Administered 2011-11-14: 40 ug/kg/min via INTRAVENOUS
  Administered 2011-11-14: 30 ug/kg/min via INTRAVENOUS
  Administered 2011-11-15 (×2): 45 ug/kg/min via INTRAVENOUS
  Administered 2011-11-15 (×4): 50 ug/kg/min via INTRAVENOUS
  Administered 2011-11-15: 45 ug/kg/min via INTRAVENOUS
  Administered 2011-11-15: 40 ug/kg/min via INTRAVENOUS
  Filled 2011-11-14 (×5): qty 100
  Filled 2011-11-14: qty 200
  Filled 2011-11-14 (×3): qty 100

## 2011-11-14 MED ORDER — LIDOCAINE HCL (CARDIAC) 20 MG/ML IV SOLN
INTRAVENOUS | Status: AC
  Filled 2011-11-14: qty 5

## 2011-11-14 MED ORDER — IPRATROPIUM-ALBUTEROL 18-103 MCG/ACT IN AERO
6.0000 | INHALATION_SPRAY | Freq: Four times a day (QID) | RESPIRATORY_TRACT | Status: DC
  Administered 2011-11-14 – 2011-11-18 (×15): 6 via RESPIRATORY_TRACT
  Filled 2011-11-14: qty 14.7

## 2011-11-14 MED ORDER — HYDRALAZINE HCL 20 MG/ML IJ SOLN
10.0000 mg | INTRAMUSCULAR | Status: DC | PRN
  Administered 2011-11-14 (×2): 20 mg via INTRAVENOUS
  Administered 2011-11-15 (×2): 40 mg via INTRAVENOUS
  Administered 2011-11-17 – 2011-11-24 (×7): 20 mg via INTRAVENOUS
  Administered 2011-11-24: 40 mg via INTRAVENOUS
  Filled 2011-11-14 (×8): qty 2

## 2011-11-14 NOTE — ED Notes (Signed)
Per EMS - pt's family called EMS d/t AMS starting a few days ago and they think it has gotten worse. Pt has also been sob when she lays down and upon EMS arrival. Pt was given albuterol neb tx. Pt O2 sats raised to 96% during the tx. Pt's family reports pt is normally talkative and walks around house on own. Pt was recently transferred home from a nursing facility.

## 2011-11-14 NOTE — Progress Notes (Signed)
eLink Physician-Brief Progress Note Patient Name: [name] DOB: 04/12/1940 MRN: [id number]  Date of Service  11/14/2011   HPI/Events of Note   Troponin slightly elevated; likely attributed to hypertensive emergency; EKG without acute change  eICU Interventions  Monitor, repeat enzyme as ordered   Intervention Category Intermediate Interventions: Diagnostic test evaluation  MCQUAID, DOUGLAS 11/14/2011, 9:20 PM

## 2011-11-14 NOTE — Progress Notes (Signed)
eLink Physician-Brief Progress Note Patient Name: [name] DOB: Oct 16, 1940 MRN: [id number]  Date of Service  11/14/2011   HPI/Events of Note   Hypertensive, no nitrates and hydralizine at home  eICU Interventions  Add prn hydralazine and nitroglycerin ointment   Intervention Category Major Interventions: Hypertension - evaluation and management  Paw Karstens 11/14/2011, 6:09 PM

## 2011-11-14 NOTE — H&P (Addendum)
Name: [name] MRN: [id number] DOB: [birth year]    LOS: 0  Referring Provider:  Oletta Lamas Reason for Referral:  Acute on chronic respiratory failure  PULMONARY / CRITICAL CARE MEDICINE  HPI:  71 yo with a plethora of health problems - OHS/OSA, diastolic and systolic CHF, COPD, DM, HTN. Discharged 3 days prior to this admit from SNF. Presents with hypercarbic resp failure and required intubation 9/22. PCCM will admit to ICU  Past Medical History  Diagnosis Date  . COPD (chronic obstructive pulmonary disease)     on home o2  . Chronic renal insufficiency   . Type II or unspecified type diabetes mellitus without mention of complication, not stated as uncontrolled   . CVA (cerebral infarction)     hx  . Hypertensive heart disease with congestive heart failure   . Chronic cor pulmonale   . Cardiomyopathy of undetermined type 09/07/2011  . Chronic kidney disease (CKD), stage IV (severe)   . Hyperlipdemia   . Obesity (BMI 30-39.9)   . Sleep apnea   . History of gout 09/07/2011  . Heart failure 7/13   Past Surgical History  Procedure Date  . Bilateral oophorectomy   . Cardiac catheterization     right heart cath   Prior to Admission medications   Medication Sig Start Date End Date Taking? Authorizing Provider  albuterol (PROVENTIL) (5 MG/ML) 0.5% nebulizer solution Take 2.5 mg by nebulization 3 (three) times daily. Shortness of breath 09/16/11 09/15/12 Yes Belkys A Regalado, MD  aspirin EC 81 MG tablet Take 81 mg by mouth daily.   Yes Historical Provider, MD  atorvastatin (LIPITOR) 10 MG tablet Take 10 mg by mouth every evening.    Yes Historical Provider, MD  carvedilol (COREG) 12.5 MG tablet Take 12.5 mg by mouth 2 (two) times daily with a meal.   Yes Historical Provider, MD  hydrALAZINE (APRESOLINE) 25 MG tablet Take 3 tablets (75 mg total) by mouth every 8 (eight) hours. 10/07/11 10/06/12 Yes Amy D Clegg, NP  insulin aspart (NOVOLOG) 100 UNIT/ML injection Inject 0-15 Units into  the skin 3 (three) times daily with meals. 09/16/11 09/15/12 Yes Belkys A Regalado, MD  ipratropium (ATROVENT) 0.02 % nebulizer solution Take 2.5 mLs (0.5 mg total) by nebulization 3 (three) times daily. 09/16/11 09/15/12 Yes Belkys A Regalado, MD  isosorbide mononitrate (IMDUR) 120 MG 24 hr tablet Take 1 tablet (120 mg total) by mouth daily. 10/07/11 10/06/12 Yes Amy D Clegg, NP  nicotine (NICODERM CQ - DOSED IN MG/24 HOURS) 21 mg/24hr patch Place 1 patch onto the skin daily.   Yes Historical Provider, MD  paricalcitol (ZEMPLAR) 1 MCG capsule Take 1 mcg by mouth daily.   Yes Historical Provider, MD  torsemide (DEMADEX) 20 MG tablet Take 1 tablet (20 mg total) by mouth daily. 10/07/11 10/06/12 Yes Amy Georgie Chard, NP   Allergies Allergies  Allergen Reactions  . Sulfonamide Derivatives Hives and Rash    Family History Family History  Problem Relation Age of Onset  . Heart attack Father   . Stroke Mother     CVA  . Coronary artery disease Daughter    Social History  reports that she quit smoking about 2 months ago. She does not have any smokeless tobacco history on file. She reports that she does not drink alcohol or use illicit drugs.  Review Of Systems:  NA  Brief patient description:  MOAAF requiring intubation  Events Since Admission: 9/22 intubated  Current Status: lethargic Vital Signs: Pulse  Rate:  [75-86] 77  (09/22 1400) Resp:  [11-30] 22  (09/22 1400) BP: (153-167)/(65-75) 153/75 mmHg (09/22 1400) SpO2:  [87 %-100 %] 100 % (09/22 1400)  Physical Examination: General: MO AAF, stunned on NIMVS Neuro:  lethargic HEENT:  No LAN, cushingoid apperance Neck:  No jvd Cardiovascular:  HSD Lungs:  Decreased Air movement Abdomen: obese +bs Musculoskeletal:  intact Skin:  warm  Active Problems:  Type 2 diabetes mellitus with vascular disease  Hyperlipdemia  Obesity (BMI 30-39.9)  TOBACCO ABUSE  Sleep apnea  Hypertensive heart disease with congestive heart failure  CORONARY  ARTERY DISEASE  Chronic cor pulmonale  ASTHMA  C O P D  Chronic kidney disease (CKD), stage IV (severe)  Acute systolic congestive heart failure  Acute respiratory failure with hypercapnia  Cardiomyopathy of undetermined type  History of gout  Bifascicular block  Other chronic pulmonary heart diseases  Acute on chronic renal failure  Anemia  Swelling of extremity, right  Acute on chronic systolic heart failure   ASSESSMENT AND PLAN  PULMONARY No results found for this basename: PHART:5,PCO2:5,PCO2ART:5,PO2ART:5,HCO3:5,O2SAT:5 in the last 168 hours Ventilator Settings:   CXR:   Dg Chest Port 1 View  11/14/2011  *RADIOLOGY REPORT*  Clinical Data: Chest pain.  PORTABLE CHEST - 1 VIEW  Comparison: 09/28/2011.  Findings: Cardiomegaly.  Pulmonary edema.  Pleural effusions suspected.  Cannot exclude basilar infiltrate in the setting. Calcified tortuous aorta.  No gross pneumothorax.  IMPRESSION:  Cardiomegaly.  Pulmonary edema.  Pleural effusions suspected.  Please see above   Original Report Authenticated By: Fuller Canada, M.D.     ETT:  9/22>>  A:  VDRF in setting of OSA ,noncompliant w CPAP, CHF, CRF and FTT P:   See vent orders BD's Diuresis - demadex continued, added lasix; will defer changes diuresis to cardiology  CARDIOVASCULAR No results found for this basename: TROPONINI:5,LATICACIDVEN:5, O2SATVEN:5,PROBNP:5 in the last 168 hours ECG:   Lines:   A: CAD./CHF/Bifasicular block  P:  Diuresis. Will consult cardiology, known to [facility] CHF clinic Continue coreg, demadex. Holding hydralazine and Imdur until we assess BP post-intubation  RENAL  Lab 11/14/11 1313  NA 143  K 4.7  CL 101  CO2 35*  BUN 41*  CREATININE 2.61*  CALCIUM 9.3  MG --  PHOS --   Intake/Output      09/21 0701 - 09/22 0700 09/22 0701 - 09/23 0700   Urine  40   Total Output  40   Net  -40         Foley:  9/22 >>   A: CRI P:   Renal consult on 9/23, followed by Dr  Eliott Nine  GASTROINTESTINAL  Lab 11/14/11 1313  AST 13  ALT 9  ALKPHOS 58  BILITOT 0.2*  PROT 7.0  ALBUMIN 3.4*    A: NPO/place ogt P:   ogt  HEMATOLOGIC  Lab 11/14/11 1313  HGB 10.3*  HCT 37.2  PLT 138*  INR 1.04  APTT 28   A:  Anemia P:  Monitor cbc  INFECTIOUS  Lab 11/14/11 1313  WBC 9.1  PROCALCITON --   Cultures: 9/22 sputum>> 9/22 uc>> 9/22 bc>> Antibiotics: none  A:  No overt infectious process P:   Pan culture  ENDOCRINE  Lab 11/14/11 1230  GLUCAP 125*   A:  DM P:   SSI protocol  NEUROLOGIC  A:  AMS due to hypercapnia P:   Monitor, defer head CT at this time head CT  BEST PRACTICE /  DISPOSITION Level of Care:  ICU Primary Service:  pccm Consultants:   Code Status:  full Diet:  npo DVT Px:  heparin GI Px:  ppi Skin Integrity:  intact Social / Family:  Updated at bedside  CC time 60 minutes  Brett Canales Minor ACNP [facility] PCCM Pager [phone] till 3 pm If no answer page [phone] 11/14/2011, 3:03 PM  Leslye Peer., M.D. Pulmonary and Critical Care Medicine [facility] Pager: [phone]  11/14/2011, 2:36 PM

## 2011-11-14 NOTE — ED Notes (Signed)
Pt intubated. VSS throughout entire procedure.

## 2011-11-14 NOTE — ED Notes (Signed)
RT at bedside to drawn blood gas and assess pt

## 2011-11-14 NOTE — Procedures (Signed)
Arterial Catheter Insertion Procedure Note [name] [id number] 01-27-1941  Procedure: Insertion of Arterial Catheter  Indications: Blood pressure monitoring  Procedure Details Consent: Unable to obtain consent because of emergent medical necessity. Time Out: Verified patient identification, verified procedure, site/side was marked, verified correct patient position, special equipment/implants available, medications/allergies/relevent history reviewed, required imaging and test results available.  Performed  Maximum sterile technique was used including antiseptics, cap, gloves, gown, hand hygiene, mask and sheet. Skin prep: Chlorhexidine; local anesthetic administered 20 gauge catheter was inserted into left radial artery using the Seldinger technique.  Evaluation Blood flow good; BP tracing good. Complications: No apparent complications.   Closson, Terie Purser 11/14/2011

## 2011-11-14 NOTE — ED Provider Notes (Signed)
History     CSN: [id number]  Arrival date & time 11/14/11  1149   First MD Initiated Contact with Patient 11/14/11 1219      Chief Complaint  Patient presents with  . Altered Mental Status  . Shortness of Breath    (Consider location/radiation/quality/duration/timing/severity/associated sxs/prior treatment) HPI Comments: Pt with h/o COPD and CHF has been in and out of hospital for CHF exacerbation, was recently admitted, discharged to rehab in mid August, has been at home since Thursday with daughter.  Almost within that day, began to have worse SOB and swelling.  Pt's mother reports pt had been eating well, but symptoms progressed, now SOB, more lethargic and difficulty staying awake.  Pt is on O2 at home at 2 L.  Daughter tried to go up on O2 with no improvement.  Pt is drowsy, will arouse briefly before going back to sleep.    Patient is a 71 y.o. female presenting with altered mental status and shortness of breath. The history is provided by the patient, medical records and a relative.  Altered Mental Status Associated symptoms include shortness of breath.  Shortness of Breath  Associated symptoms include shortness of breath.    Past Medical History  Diagnosis Date  . COPD (chronic obstructive pulmonary disease)     on home o2  . Chronic renal insufficiency   . Type II or unspecified type diabetes mellitus without mention of complication, not stated as uncontrolled   . CVA (cerebral infarction)     hx  . Hypertensive heart disease with congestive heart failure   . Chronic cor pulmonale   . Cardiomyopathy of undetermined type 09/07/2011  . Chronic kidney disease (CKD), stage IV (severe)   . Hyperlipdemia   . Obesity (BMI 30-39.9)   . Sleep apnea   . History of gout 09/07/2011  . Heart failure 7/13    Past Surgical History  Procedure Date  . Bilateral oophorectomy   . Cardiac catheterization     right heart cath    Family History  Problem Relation Age of Onset  .  Heart attack Father   . Stroke Mother     CVA  . Coronary artery disease Daughter     History  Substance Use Topics  . Smoking status: Former Smoker -- 1.0 packs/day    Quit date: 08/28/2011  . Smokeless tobacco: Not on file  . Alcohol Use: No    OB History    Grav Para Term Preterm Abortions TAB SAB Ect Mult Living                  Review of Systems  Unable to perform ROS: Mental status change  Respiratory: Positive for shortness of breath.   Psychiatric/Behavioral: Positive for altered mental status.    Allergies  Sulfonamide derivatives  Home Medications   Current Outpatient Rx  Name Route Sig Dispense Refill  . ALBUTEROL SULFATE (5 MG/ML) 0.5% IN NEBU Nebulization Take 2.5 mg by nebulization 3 (three) times daily. Shortness of breath    . ASPIRIN EC 81 MG PO TBEC Oral Take 81 mg by mouth daily.    . ATORVASTATIN CALCIUM 10 MG PO TABS Oral Take 10 mg by mouth every evening.     Marland Kitchen CARVEDILOL 12.5 MG PO TABS Oral Take 12.5 mg by mouth 2 (two) times daily with a meal.    . HYDRALAZINE HCL 25 MG PO TABS Oral Take 3 tablets (75 mg total) by mouth every 8 (eight) hours. 90  tablet 6  . INSULIN ASPART 100 UNIT/ML [place] SOLN Subcutaneous Inject 0-15 Units into the skin 3 (three) times daily with meals. 1 vial 0  . IPRATROPIUM BROMIDE 0.02 % IN SOLN Nebulization Take 2.5 mLs (0.5 mg total) by nebulization 3 (three) times daily. 75 mL 0  . ISOSORBIDE MONONITRATE ER 120 MG PO TB24 Oral Take 1 tablet (120 mg total) by mouth daily. 30 tablet 3  . NICOTINE 21 MG/24HR TD PT24 Transdermal Place 1 patch onto the skin daily.    Marland Kitchen PARICALCITOL 1 MCG PO CAPS Oral Take 1 mcg by mouth daily.    . TORSEMIDE 20 MG PO TABS Oral Take 1 tablet (20 mg total) by mouth daily. 60 tablet 3    BP 145/80  Pulse 66  Resp 24  Wt 219 lb 9.3 oz (99.6 kg)  SpO2 100%  Physical Exam  Nursing note and vitals reviewed. Constitutional: She appears well-developed. She appears lethargic. She has a sickly  appearance.  HENT:  Head: Normocephalic and atraumatic.  Mouth/Throat: Mucous membranes are not dry.       Swelling to oropharynx, pt is drooling, will cough  Neck: Neck supple.  Cardiovascular: Normal rate.   Pulmonary/Chest: Effort normal. She has no rales.  Abdominal: Soft. There is no tenderness. There is no rebound.  Musculoskeletal: She exhibits edema.  Neurological: She appears lethargic.  Skin: Skin is warm. No abrasion and no rash noted. No cyanosis. No pallor.    ED Course  Procedures (including critical care time)   CRITICAL CARE Performed by: Lear Ng.   Total critical care time: 30 min  Critical care time was exclusive of separately billable procedures and treating other patients.  Critical care was necessary to treat or prevent imminent or life-threatening deterioration.  Critical care was time spent personally by me on the following activities: development of treatment plan with patient and/or surrogate as well as nursing, discussions with consultants, evaluation of patient's response to treatment, examination of patient, obtaining history from patient or surrogate, ordering and performing treatments and interventions, ordering and review of laboratory studies, ordering and review of radiographic studies, pulse oximetry and re-evaluation of patient's condition.   Labs Reviewed  GLUCOSE, CAPILLARY - Abnormal; Notable for the following:    Glucose-Capillary 125 (*)     All other components within normal limits  CBC WITH DIFFERENTIAL - Abnormal; Notable for the following:    Hemoglobin 10.3 (*)     MCH 25.2 (*)     MCHC 27.7 (*)     RDW 16.7 (*)     Platelets 138 (*)     All other components within normal limits  COMPREHENSIVE METABOLIC PANEL - Abnormal; Notable for the following:    CO2 35 (*)     Glucose, Bld 111 (*)     BUN 41 (*)     Creatinine, Ser 2.61 (*)     Albumin 3.4 (*)     Total Bilirubin 0.2 (*)     GFR calc non Af Amer 17 (*)     GFR  calc Af Amer 20 (*)     All other components within normal limits  PRO B NATRIURETIC PEPTIDE - Abnormal; Notable for the following:    Pro B Natriuretic peptide (BNP) 45375.0 (*)     All other components within normal limits  URINALYSIS, ROUTINE W REFLEX MICROSCOPIC - Abnormal; Notable for the following:    APPearance CLOUDY (*)     Hgb urine dipstick SMALL (*)  Protein, ur >300 (*)     All other components within normal limits  URINE MICROSCOPIC-ADD ON - Abnormal; Notable for the following:    Squamous Epithelial / LPF MANY (*)     All other components within normal limits  APTT  PROTIME-INR  TROPONIN I  TROPONIN I  TROPONIN I  CULTURE, BLOOD (ROUTINE X 2)  CULTURE, BLOOD (ROUTINE X 2)  URINE CULTURE  CULTURE, RESPIRATORY  BLOOD GAS, ARTERIAL   Dg Chest Port 1 View  11/14/2011  *RADIOLOGY REPORT*  Clinical Data: Chest pain.  PORTABLE CHEST - 1 VIEW  Comparison: 09/28/2011.  Findings: Cardiomegaly.  Pulmonary edema.  Pleural effusions suspected.  Cannot exclude basilar infiltrate in the setting. Calcified tortuous aorta.  No gross pneumothorax.  IMPRESSION:  Cardiomegaly.  Pulmonary edema.  Pleural effusions suspected.  Please see above   Original Report Authenticated By: Fuller Canada, M.D.      1. Hypercarbia   2. Respiratory distress   3. CHF (congestive heart failure)   4. Acute on chronic renal failure   5. Acute on chronic systolic heart failure   6. Acute respiratory failure with hypercapnia   7. Cardiomyopathy of undetermined type   8. Obstructive sleep apnea (adult) (pediatric)     Sat on 2L of O2 by [place] is 93% which is adequate on my interpretation  ecg at time 1159 shows sr at rate 80, RBBB, LAD, LVH by voltage with repol abn's.  Similar to ecg from 09/09/11.    2:01 PM PH is acidotic on ABG, pCO2 is >100.  Will need positive pressure ventilation through biPAP, will consider intubation.  Pt is protecting airway at present.     2:15 PM Spoke to Dr. Delton Coombes  who will see pt in the ED.  Will maintain on BiPAP for now, monitor closely.  Needs ICU admit.  Discussed at length with family.     MDM  Pt is somnolent, will arouse to loud voice briefly and will go back to sleep, purposeful movements of arms, hands, will try to sit up on her own.  Obtunded mentation and I suspect may be hypercarbic.  Will get ABG, order BiPAP, CXR, order IV lasix and foley and neb treatment.  Will need admission.          Gavin Pound. Jaece Ducharme, MD 11/14/11 1538

## 2011-11-14 NOTE — ED Notes (Signed)
Critical care and RT at bedside to intubate pt.

## 2011-11-14 NOTE — Procedures (Signed)
Intubation Procedure Note [name] [id number] 1940-07-01  Procedure: Intubation Indications: Respiratory insufficiency  Procedure Details Consent: Risks of procedure as well as the alternatives and risks of each were explained to the (patient/caregiver).  Consent for procedure obtained. Time Out: Verified patient identification, verified procedure, site/side was marked, verified correct patient position, special equipment/implants available, medications/allergies/relevent history reviewed, required imaging and test results available.  Performed  MAC and 3 Medications: All IV  Fentanyl 100 mcg Etomidate 20 mg Versed 2 mg  NMB    Evaluation Hemodynamic Status: BP stable throughout; O2 sats: stable throughout Patient's Current Condition: stable Complications: No apparent complications Patient did tolerate procedure well. Chest X-ray ordered to verify placement.  CXR: pending.   Brett Canales Minor ACNP [facility] PCCM Pager [phone] till 3 pm If no answer page [phone] 11/14/2011, 3:12 PM   Levy Pupa, MD, PhD 11/14/2011, 3:25 PM [place] [facility] [phone] or if no answer [phone]

## 2011-11-15 ENCOUNTER — Inpatient Hospital Stay (HOSPITAL_COMMUNITY): Payer: Medicare Other

## 2011-11-15 LAB — GLUCOSE, CAPILLARY
Glucose-Capillary: 70 mg/dL (ref 70–99)
Glucose-Capillary: 79 mg/dL (ref 70–99)
Glucose-Capillary: 82 mg/dL (ref 70–99)
Glucose-Capillary: 84 mg/dL (ref 70–99)
Glucose-Capillary: 89 mg/dL (ref 70–99)

## 2011-11-15 LAB — BLOOD GAS, ARTERIAL
Drawn by: 252031
MECHVT: 500 mL
PEEP: 5 cmH2O
Patient temperature: 98.6
RATE: 16 resp/min
pH, Arterial: 7.505 — ABNORMAL HIGH (ref 7.350–7.450)

## 2011-11-15 LAB — BASIC METABOLIC PANEL
BUN: 41 mg/dL — ABNORMAL HIGH (ref 6–23)
Chloride: 99 mEq/L (ref 96–112)
Creatinine, Ser: 2.32 mg/dL — ABNORMAL HIGH (ref 0.50–1.10)
Glucose, Bld: 88 mg/dL (ref 70–99)
Potassium: 3.7 mEq/L (ref 3.5–5.1)

## 2011-11-15 LAB — CBC
HCT: 31.5 % — ABNORMAL LOW (ref 36.0–46.0)
Hemoglobin: 9.4 g/dL — ABNORMAL LOW (ref 12.0–15.0)
MCV: 85.1 fL (ref 78.0–100.0)
WBC: 9.6 10*3/uL (ref 4.0–10.5)

## 2011-11-15 LAB — TROPONIN I: Troponin I: 0.44 ng/mL (ref <0.30)

## 2011-11-15 LAB — PROCALCITONIN: Procalcitonin: <0.1 ng/mL

## 2011-11-15 LAB — PRO B NATRIURETIC PEPTIDE: Pro B Natriuretic peptide (BNP): 39975 pg/mL — ABNORMAL HIGH (ref 0–125)

## 2011-11-15 LAB — TSH: TSH: 1.257 u[IU]/mL (ref 0.350–4.500)

## 2011-11-15 MED ORDER — SODIUM CHLORIDE 0.9 % IV SOLN
INTRAVENOUS | Status: DC
  Administered 2011-11-15 – 2011-11-21 (×2): 20 mL/h via INTRAVENOUS

## 2011-11-15 MED ORDER — TORSEMIDE 20 MG PO TABS
20.0000 mg | ORAL_TABLET | Freq: Every day | ORAL | Status: DC
  Administered 2011-11-15 – 2011-11-18 (×4): 20 mg
  Filled 2011-11-15 (×4): qty 1

## 2011-11-15 MED ORDER — NITROGLYCERIN 2 % TD OINT
1.0000 [in_us] | TOPICAL_OINTMENT | Freq: Four times a day (QID) | TRANSDERMAL | Status: DC
  Administered 2011-11-15 – 2011-11-25 (×38): 1 [in_us] via TOPICAL
  Filled 2011-11-15: qty 30

## 2011-11-15 MED ORDER — VANCOMYCIN HCL IN DEXTROSE 1-5 GM/200ML-% IV SOLN
1000.0000 mg | INTRAVENOUS | Status: DC
  Administered 2011-11-15 – 2011-11-16 (×2): 1000 mg via INTRAVENOUS
  Filled 2011-11-15 (×2): qty 200

## 2011-11-15 NOTE — Progress Notes (Signed)
INITIAL ADULT NUTRITION ASSESSMENT Date: 11/15/2011   Time: 10:57 AM  Reason for Assessment: MD Consult for TF recommendations  INTERVENTION:  When able to start TF, recommend Jevity 1.2 at 10 ml/h with Prostat 30 ml QID to provide 688 kcals, 73 grams protein, 194 ml free water daily.  Will not be able to meet nutrition goal with current rate of Propofol.  DOCUMENTATION CODES Per approved criteria  -Obesity Unspecified   ASSESSMENT: Female 71 y.o.  Dx: Hypercarbic resp failure; required intubation 9/22  Hx:  Past Medical History  Diagnosis Date  . COPD (chronic obstructive pulmonary disease)     on home o2  . Chronic renal insufficiency   . Type II or unspecified type diabetes mellitus without mention of complication, not stated as uncontrolled   . CVA (cerebral infarction)     hx  . Hypertensive heart disease with congestive heart failure   . Chronic cor pulmonale   . Cardiomyopathy of undetermined type 09/07/2011  . Chronic kidney disease (CKD), stage IV (severe)   . Hyperlipdemia   . Obesity (BMI 30-39.9)   . Sleep apnea   . History of gout 09/07/2011  . Heart failure 7/13    Past Surgical History  Procedure Date  . Bilateral oophorectomy   . Cardiac catheterization     right heart cath    Related Meds:  Scheduled Meds:   . albuterol      . albuterol-ipratropium  6 puff Inhalation Q6H  . antiseptic oral rinse  15 mL Mouth Rinse QID  . aspirin EC  81 mg Oral Daily  . atorvastatin  10 mg Oral QPM  . carvedilol  12.5 mg Oral BID WC  . chlorhexidine  15 mL Mouth Rinse BID  . chlorhexidine      . etomidate  20 mg Intravenous Once  . fentaNYL  100 mcg Intravenous Once  . furosemide  40 mg Intravenous BID  . furosemide  80 mg Intravenous Once  . heparin  5,000 Units Subcutaneous Q8H  . midazolam  2 mg Intravenous Once  . nicotine  21 mg Transdermal Q24H  . pantoprazole (PROTONIX) IV  40 mg Intravenous QHS  . paricalcitol  1 mcg Oral Daily  . DISCONTD:  albuterol  5 mg Nebulization Once  . DISCONTD: ipratropium  0.5 mg Nebulization Once  . DISCONTD: torsemide  20 mg Oral Daily   Continuous Infusions:   . propofol 45 mcg/kg/min (11/15/11 0947)   PRN Meds:.albuterol, fentaNYL, hydrALAZINE, labetalol, midazolam, nitroGLYCERIN, DISCONTD: labetalol   Ht: 5\' 4"  (162.6 cm)  Wt: 207 lb 14.3 oz (94.3 kg)  Ideal Wt: 54.5 kg % Ideal Wt: 173%  Usual Wt:  Wt Readings from Last 10 Encounters:  11/15/11 207 lb 14.3 oz (94.3 kg)  10/07/11 219 lb 9.6 oz (99.61 kg)  09/16/11 246 lb 6 oz (111.755 kg)  09/16/11 246 lb 6 oz (111.755 kg)   % Usual Wt: 84%  Body mass index is 35.68 kg/(m^2).  Food/Nutrition Related Hx: 16% weight loss in the past 2 months  Labs:  CMP     Component Value Date/Time   NA 141 11/15/2011 0409   K 3.7 11/15/2011 0409   CL 99 11/15/2011 0409   CO2 32 11/15/2011 0409   GLUCOSE 88 11/15/2011 0409   BUN 41* 11/15/2011 0409   CREATININE 2.32* 11/15/2011 0409   CALCIUM 9.1 11/15/2011 0409   PROT 7.0 11/14/2011 1313   ALBUMIN 3.4* 11/14/2011 1313   AST 13 11/14/2011 1313  ALT 9 11/14/2011 1313   ALKPHOS 58 11/14/2011 1313   BILITOT 0.2* 11/14/2011 1313   GFRNONAA 20* 11/15/2011 0409   GFRAA 23* 11/15/2011 0409    CBG (last 3)   Basename 11/15/11 0818 11/15/11 0345 11/14/11 2349  GLUCAP 79 84 70    Sodium  Date/Time Value Range Status  11/15/2011  4:09 AM 141  135 - 145 mEq/L Final  11/14/2011  1:13 PM 143  135 - 145 mEq/L Final  10/07/2011  6:50 AM 139  135 - 145 mEq/L Final    Potassium  Date/Time Value Range Status  11/15/2011  4:09 AM 3.7  3.5 - 5.1 mEq/L Final  11/14/2011  1:13 PM 4.7  3.5 - 5.1 mEq/L Final  10/07/2011  6:50 AM 4.4  3.5 - 5.1 mEq/L Final    Phosphorus  Date/Time Value Range Status  11/15/2011  4:09 AM 2.5  2.3 - 4.6 mg/dL Final  1/61/0960  4:54 PM 5.0* 2.3 - 4.6 mg/dL Final    Magnesium  Date/Time Value Range Status  11/15/2011  4:09 AM 1.6  1.5 - 2.5 mg/dL Final  0/98/1191  4:78 AM 1.8   1.5 - 2.5 mg/dL Final  2/95/6213  0:86 AM 1.6  1.5 - 2.5 mg/dL Final     Intake/Output Summary (Last 24 hours) at 11/15/11 1101 Last data filed at 11/15/11 1000  Gross per 24 hour  Intake  797.3 ml  Output   3390 ml  Net -2592.7 ml     Diet Order: NPO   IVF:    propofol Last Rate: 45 mcg/kg/min (11/15/11 0947)    Estimated Nutritional Needs:   Kcal: 1690 Protein: 100-120 grams Fluid: 1.8-2 liters  Patient unable to provide any nutrition history.  No family available with whom to discuss nutrition history.  Unsure whether recent 16% weight loss over the past 2 months was intentional or unintentional.  With history of CHF, weight change could be related to fluids.    Patient is currently intubated on ventilator support.  MV: 8.3 Temp:Temp (24hrs), Avg:99.3 F (37.4 C), Min:98.7 F (37.1 C), Max:100 F (37.8 C)  Propofol at 29.9 ml/hr providing 789 kcals/day.  Received consult for TF recommendations, no plans to start TF today per discussion with Dr. Delton Coombes; may start TF tomorrow.   NUTRITION DIAGNOSIS: -Inadequate oral intake (NI-2.1).  Status: Ongoing  RELATED TO: inability to eat  AS EVIDENCED BY: NPO status  MONITORING/EVALUATION(Goals):  Goal:  Enteral nutrition to provide 60-70% of estimated calorie needs (22-25 kcals/kg ideal body weight) and >/= 90% of estimated protein needs, based on ASPEN guidelines for permissive underfeeding in critically ill obese individuals.  Monitor:  Labs, weight trend, TF initiation and tolerance, vent status, Propofol use  EDUCATION NEEDS: -Education not appropriate at this time  Joaquin Courts, RD, LDN, CNSC Pager# [phone] After Hours Pager# [phone]  11/15/2011, 10:57 AM

## 2011-11-15 NOTE — Progress Notes (Signed)
ANTIBIOTIC CONSULT NOTE - INITIAL  Pharmacy Consult for Vancomycin Indication: suspected pneumonia  Allergies  Allergen Reactions  . Sulfonamide Derivatives Hives and Rash    Patient Measurements: Height: 5\' 4"  (162.6 cm) Weight: 207 lb 14.3 oz (94.3 kg) IBW/kg (Calculated) : 54.7    Vital Signs: Temp: 99.7 F (37.6 C) (09/23 1600) Temp src: Oral (09/23 1600) BP: 166/82 mmHg (09/23 2100) Pulse Rate: 83  (09/23 2100) Intake/Output from previous day: 09/22 0701 - 09/23 0700 In: 736.6 [I.V.:656.6; NG/GT:30; IV Piggyback:10] Out: 2490 [Urine:2490] Intake/Output from this shift: Total I/O In: 179.7 [I.V.:149.7; NG/GT:20; IV Piggyback:10] Out: 600 [Urine:600]  Labs:  [facility] 11/15/11 0409 11/14/11 1313  WBC 9.6 9.1  HGB 9.4* 10.3*  PLT 139* 138*  LABCREA -- --  CREATININE 2.32* 2.61*   Estimated Creatinine Clearance: 24.8 ml/min (by C-G formula based on Cr of 2.32). No results found for this basename: VANCOTROUGH:2,VANCOPEAK:2,VANCORANDOM:2,GENTTROUGH:2,GENTPEAK:2,GENTRANDOM:2,TOBRATROUGH:2,TOBRAPEAK:2,TOBRARND:2,AMIKACINPEAK:2,AMIKACINTROU:2,AMIKACIN:2, in the last 72 hours   Microbiology: Recent Results (from the past 720 hour(s))  CULTURE, BLOOD (ROUTINE X 2)     Status: Normal (Preliminary result)   Collection Time   11/14/11  3:20 PM      Component Value Range Status Comment   Specimen Description BLOOD LEFT ARM   Final    Special Requests BOTTLES DRAWN AEROBIC ONLY 10CC   Final    Culture  Setup Time 11/14/2011 20:13   Final    Culture     Final    Value:        BLOOD CULTURE RECEIVED NO GROWTH TO DATE CULTURE WILL BE HELD FOR 5 DAYS BEFORE ISSUING A FINAL NEGATIVE REPORT   Report Status PENDING   Incomplete   CULTURE, BLOOD (ROUTINE X 2)     Status: Normal (Preliminary result)   Collection Time   11/14/11  3:40 PM      Component Value Range Status Comment   Specimen Description BLOOD LEFT ARM   Final    Special Requests BOTTLES DRAWN AEROBIC AND ANAEROBIC  10CC   Final    Culture  Setup Time 11/14/2011 20:13   Final    Culture     Final    Value: GRAM POSITIVE COCCI IN CLUSTERS     Note: Gram Stain Report Called to,Read Back By and Verified With: JAMES AT 2100 11/15/11 BY SNOLO   Report Status PENDING   Incomplete   CULTURE, RESPIRATORY     Status: Normal (Preliminary result)   Collection Time   11/14/11  4:38 PM      Component Value Range Status Comment   Specimen Description TRACHEAL ASPIRATE   Final    Special Requests NONE   Final    Gram Stain PENDING   Incomplete    Culture Culture reincubated for better growth   Final    Report Status PENDING   Incomplete   MRSA PCR SCREENING     Status: Normal   Collection Time   11/14/11  4:42 PM      Component Value Range Status Comment   MRSA by PCR NEGATIVE  NEGATIVE Final     Medical History: Past Medical History  Diagnosis Date  . COPD (chronic obstructive pulmonary disease)     on home o2  . Chronic renal insufficiency   . Type II or unspecified type diabetes mellitus without mention of complication, not stated as uncontrolled   . CVA (cerebral infarction)     hx  . Hypertensive heart disease with congestive heart failure   .  Chronic cor pulmonale   . Cardiomyopathy of undetermined type 09/07/2011  . Chronic kidney disease (CKD), stage IV (severe)   . Hyperlipdemia   . Obesity (BMI 30-39.9)   . Sleep apnea   . History of gout 09/07/2011  . Heart failure 7/13    Medications:  Scheduled:    . albuterol-ipratropium  6 puff Inhalation Q6H  . antiseptic oral rinse  15 mL Mouth Rinse QID  . aspirin EC  81 mg Oral Daily  . atorvastatin  10 mg Oral QPM  . carvedilol  12.5 mg Oral BID WC  . chlorhexidine  15 mL Mouth Rinse BID  . furosemide  40 mg Intravenous BID  . heparin  5,000 Units Subcutaneous Q8H  . nicotine  21 mg Transdermal Q24H  . nitroGLYCERIN  1 inch Topical Q6H  . pantoprazole (PROTONIX) IV  40 mg Intravenous QHS  . paricalcitol  1 mcg Oral Daily  . torsemide  20  mg Per Tube Daily   Assessment: 71 y.o female  with suspected pneumonia. 1 of 2 blood cultures reported as Gram positive cocci in clusters.  She has history of multiple health problems including diastolic and systolic CHF, COPD, DM, HTN and CKD (stage IV). Noted she was discharged from SNF 3 days prior to this admit.   SCr = 2.32,  CrCl ~ 24.8 ml/min Weight = 94.3kg  Goal of Therapy:  Vancomycin trough level 15-20 mcg/ml  Plan:  Vancomycin 1000 mg IV q24h Monitor renal function, cultures and clinical status. Vancomycin trough at steady state if continued > 5days or earlier if renal function warrants.   [name], RPh Clinical Pharmacist Pager: [phone] 11/15/2011,10:06 PM

## 2011-11-15 NOTE — Progress Notes (Signed)
Name: [name] MRN: [id number] DOB: 07-17-1940    LOS: 1  Referring Provider:  Oletta Lamas Reason for Referral:  Acute on chronic respiratory failure  PULMONARY / CRITICAL CARE MEDICINE  HPI:  71 yo with a plethora of health problems - OHS/OSA, diastolic and systolic CHF, COPD, DM, HTN. Discharged 3 days prior to this admit from SNF. Presents with hypercarbic resp failure and required intubation 9/22. PCCM will admit to ICU  Events Since Admission: 9/22 intubated  Current Status: Guarded  Interval Events:  Awake on WUA this am Good diuresis last 24 hours NTG paste added overnight  Vital Signs: Temp:  [98.7 F (37.1 C)-100 F (37.8 C)] 99 F (37.2 C) (09/23 1138) Pulse Rate:  [64-90] 73  (09/23 1300) Resp:  [8-30] 16  (09/23 1300) BP: (135-215)/(60-92) 142/60 mmHg (09/23 1300) SpO2:  [90 %-100 %] 99 % (09/23 1324) Arterial Line BP: (159-220)/(77-95) 195/77 mmHg (09/23 1100) FiO2 (%):  [40 %-60 %] 40 % (09/23 1324) Weight:  [94.3 kg (207 lb 14.3 oz)-99.6 kg (219 lb 9.3 oz)] 94.3 kg (207 lb 14.3 oz) (09/23 0500)  Intake/Output Summary (Last 24 hours) at 11/15/11 1429 Last data filed at 11/15/11 1400  Gross per 24 hour  Intake 1076.9 ml  Output   4300 ml  Net -3223.1 ml    Physical Examination: General: MO AAF, intubated Neuro:  Wakes easily, follows commands, moves al ext HEENT:  No LAN, cushingoid apperance Neck:  Thick neck, JVD difficult to assess Cardiovascular:  HSD Lungs:  Distant, B crackles Abdomen: obese +bs Musculoskeletal:  intact Skin:  warm  Active Problems:  Type 2 diabetes mellitus with vascular disease  Hyperlipdemia  Obesity (BMI 30-39.9)  TOBACCO ABUSE  Sleep apnea  Hypertensive heart disease with congestive heart failure  CORONARY ARTERY DISEASE  Chronic cor pulmonale  ASTHMA  C O P D  Chronic kidney disease (CKD), stage IV (severe)  Acute systolic congestive heart failure  Acute respiratory failure with hypercapnia  Cardiomyopathy of undetermined type  History of gout  Bifascicular block  Other chronic pulmonary heart diseases  Acute on chronic renal failure  Anemia  Swelling of extremity, right  Acute on chronic systolic heart failure   ASSESSMENT AND PLAN  PULMONARY  Lab 11/15/11 0320 11/14/11 1700  PHART 7.505* 7.481*  PCO2ART 41.0 48.6*  PO2ART 90.4 223.0*  HCO3 32.1* 36.3*  O2SAT 99.3 100.0   Ventilator Settings: Vent Mode:  [-] PRVC FiO2 (%):  [40 %-60 %] 40 % Set Rate:  [16 bmp] 16 bmp Vt Set:  [500 mL] 500 mL PEEP:  [5 cmH20] 5 cmH20 Plateau Pressure:  [16 cmH20-22 cmH20] 19 cmH20 CXR:   Portable Chest Xray In Am  11/15/2011  *RADIOLOGY REPORT*  Clinical Data: Evaluate support apparatus.  Pulmonary infiltrates. Short of breath.  PORTABLE CHEST - 1 VIEW  Comparison: 11/14/2011.  Findings: Endotracheal tube is 14 mm from the carina.  Nasogastric tube is present with the tip not visualized.  Pulmonary aeration appears little changed with dense retrocardiac opacity, likely effusion and atelectasis.  Retrocardiac airspace disease cannot be excluded.  The airspace disease is present in the right perihilar and right basilar regions which may represent pneumonia or pulmonary edema.  IMPRESSION:  1.  Endotracheal tube 14 mm from the carina. 2.  Introduction of nasogastric tube with the tip not visualized. 3.  Unchanged pulmonary aeration with retrocardiac consolidation and perihilar and right basilar airspace disease.   Original Report Authenticated By: Andreas Newport, M.D.  Portable Chest Xray  11/14/2011  *RADIOLOGY REPORT*  Clinical Data: Endotracheal tube placement  PORTABLE CHEST - 1 VIEW  Comparison: Prior films same day  Findings: Cardiomegaly again noted.  Persistent bilateral small pleural effusion with bilateral basilar atelectasis or infiltrate. Stable central vascular congestion.  Endotracheal tube with tip at the level of the carina.  The endotracheal tube should be retracted about  1.5 cm. No diagnostic pneumothorax.  Lung apices are obscured by overlying patient's chin .  IMPRESSION: Bilateral small pleural effusion and bilateral basilar atelectasis or infiltrate.  Cardiomegaly again noted.  Endotracheal tube with tip at the level of the carina.  Endotracheal tube should be retracted about 1.5 cm.   Original Report Authenticated By: Natasha Mead, M.D.    Dg Chest Port 1 View  11/14/2011  *RADIOLOGY REPORT*  Clinical Data: Chest pain.  PORTABLE CHEST - 1 VIEW  Comparison: 09/28/2011.  Findings: Cardiomegaly.  Pulmonary edema.  Pleural effusions suspected.  Cannot exclude basilar infiltrate in the setting. Calcified tortuous aorta.  No gross pneumothorax.  IMPRESSION:  Cardiomegaly.  Pulmonary edema.  Pleural effusions suspected.  Please see above   Original Report Authenticated By: Fuller Canada, M.D.     ETT:  9/22>>  A:  VDRF in setting of OSA ,noncompliant w CPAP, diastolic and systolic CHF P:   See vent orders, daily WUA and SBT BD's Add back demadex as patients can often respond differently to this than lasix Additional lasix ordered 40mg  IV bid  CARDIOVASCULAR  Lab 11/15/11 0409 11/14/11 2035 11/14/11 1514 11/14/11 1313  TROPONINI 0.44* 0.41* <0.30 --  LATICACIDVEN 0.7 -- -- --  PROBNP 39975.0* -- -- 45375.0*   ECG:   Lines:   A: CAD./CHF/Bifasicular block  A: troponin +, likely secondary to AE-CHF P:  Diuresis w both demadex and lasix as above. Will consult cardiology 9/24, known to [facility] CH[facility] clinic Continue coreg, ntg paste Hydralazine prn  RENAL  Lab 11/15/11 0409 11/14/11 1313  NA 141 143  K 3.7 4.7  CL 99 101  CO2 32 35*  BUN 41* 41*  CREATININE 2.32* 2.61*  CALCIUM 9.1 9.3  MG 1.6 --  PHOS 2.5 --   Intake/Output      09/22 0701 - 09/23 0700 09/23 0701 - 09/24 0700   I.V. (mL/kg) 656.6 (7) 340.3 (3.6)   Other 40    NG/GT 30    IV Piggyback 10    Total Intake(mL/kg) 736.6 (7.8) 340.3 (3.6)   Urine (mL/kg/hr) 2490 (1.1) 1850 (2.6)     Total Output 2490 1850   Net -1753.4 -1509.7         Foley:  9/22 >>   A: CRI P:   Renal consult on 9/24, followed by Dr Eliott Nine  GASTROINTESTINAL  Lab 11/14/11 1313  AST 13  ALT 9  ALKPHOS 58  BILITOT 0.2*  PROT 7.0  ALBUMIN 3.4*    A: NPO/place ogt P:   Nutrition consulted, will start TF if we believe she will require MV for several days  HEMATOLOGIC  Lab 11/15/11 0409 11/14/11 1313  HGB 9.4* 10.3*  HCT 31.5* 37.2  PLT 139* 138*  INR -- 1.04  APTT -- 28   A:  Anemia P:  Monitor cbc  INFECTIOUS  Lab 11/15/11 0409 11/14/11 1313  WBC 9.6 9.1  PROCALCITON <0.10 --   Cultures: 9/22 sputum>> 9/22 uc>> 9/22 bc>> Antibiotics: none  A:  No overt infectious process P:   Pan culture pending  ENDOCRINE  Lab 11/15/11 1136 11/15/11 0818 11/15/11 0345 11/14/11 2349 11/14/11 1944  GLUCAP 89 79 84 70 80   A:  DM P:   SSI protocol  NEUROLOGIC  A:  AMS due to hypercapnia P:   Monitor, defer head CT at this time  BEST PRACTICE / DISPOSITION Level of Care:  ICU Primary Service:  pccm Consultants:   Code Status:  full Diet:  npo DVT Px:  heparin GI Px:  ppi Skin Integrity:  intact Social / Family:  Updated at bedside  CC time 30 minutes  Leslye Peer., M.D. Pulmonary and Critical Care Medicine [facility] Pager: [phone]  11/15/2011, 2:28 PM

## 2011-11-15 NOTE — Progress Notes (Signed)
eLink Physician-Brief Progress Note Patient Name: [name] DOB: 1940/06/11 MRN: [id number]  Date of Service  11/15/2011   HPI/Events of Note   gpc clusters , recent hosp stay , vent  eICU Interventions  vanc   Intervention Category Major Interventions: Respiratory failure - evaluation and management  Nelda Bucks. 11/15/2011, 9:21 PM

## 2011-11-15 NOTE — Progress Notes (Signed)
CRITICAL VALUE ALERT  Critical value received:  Positive blood culture. Gram pos cocci in clusters  Date of notification:  11/15/2011  Time of notification:  2102  Critical value read back:yes  Nurse who received alert:  Loraine Leriche, RN  MD notified (1st page):  Pola Corn , Md  Time of first page:  2104  MD notified (2nd page):  Time of second page:  Responding MD:   Time MD responded:

## 2011-11-16 ENCOUNTER — Inpatient Hospital Stay (HOSPITAL_COMMUNITY): Payer: Medicare Other

## 2011-11-16 LAB — BASIC METABOLIC PANEL
BUN: 37 mg/dL — ABNORMAL HIGH (ref 6–23)
Creatinine, Ser: 2.07 mg/dL — ABNORMAL HIGH (ref 0.50–1.10)
GFR calc Af Amer: 27 mL/min — ABNORMAL LOW (ref >90)
GFR calc non Af Amer: 23 mL/min — ABNORMAL LOW (ref >90)
Glucose, Bld: 92 mg/dL (ref 70–99)

## 2011-11-16 LAB — URINE CULTURE: Colony Count: >=100000

## 2011-11-16 LAB — GLUCOSE, CAPILLARY
Glucose-Capillary: 87 mg/dL (ref 70–99)
Glucose-Capillary: 89 mg/dL (ref 70–99)
Glucose-Capillary: 97 mg/dL (ref 70–99)
Glucose-Capillary: 93 mg/dL (ref 70–99)
Glucose-Capillary: 83 mg/dL (ref 70–99)

## 2011-11-16 LAB — CBC
HCT: 30.4 % — ABNORMAL LOW (ref 36.0–46.0)
MCH: 24.8 pg — ABNORMAL LOW (ref 26.0–34.0)
MCV: 81.1 fL (ref 78.0–100.0)
Platelets: 123 10*3/uL — ABNORMAL LOW (ref 150–400)
RDW: 17.5 % — ABNORMAL HIGH (ref 11.5–15.5)

## 2011-11-16 LAB — CULTURE, RESPIRATORY W GRAM STAIN

## 2011-11-16 MED ORDER — PANTOPRAZOLE SODIUM 40 MG PO PACK
40.0000 mg | PACK | Freq: Every day | ORAL | Status: DC
  Administered 2011-11-16 – 2011-11-17 (×2): 40 mg
  Filled 2011-11-16 (×4): qty 20

## 2011-11-16 MED ORDER — POTASSIUM CHLORIDE CRYS ER 20 MEQ PO TBCR: 40.0000 meq | EXTENDED_RELEASE_TABLET | Freq: Once | ORAL | Status: DC

## 2011-11-16 MED ORDER — POTASSIUM CHLORIDE 20 MEQ/15ML (10%) PO LIQD
40.0000 meq | Freq: Once | ORAL | Status: AC
  Administered 2011-11-16: 40 meq
  Filled 2011-11-16: qty 30

## 2011-11-16 MED ORDER — ASPIRIN 81 MG PO CHEW
81.0000 mg | CHEWABLE_TABLET | Freq: Every day | ORAL | Status: DC
  Administered 2011-11-16 – 2011-11-25 (×8): 81 mg
  Filled 2011-11-16 (×8): qty 1

## 2011-11-16 MED ORDER — MAGNESIUM SULFATE 40 MG/ML IJ SOLN
2.0000 g | Freq: Once | INTRAMUSCULAR | Status: AC
  Administered 2011-11-16: 2 g via INTRAVENOUS
  Filled 2011-11-16: qty 50

## 2011-11-16 MED ORDER — POTASSIUM CHLORIDE 20 MEQ/15ML (10%) PO LIQD
ORAL | Status: AC
  Filled 2011-11-16: qty 30

## 2011-11-16 NOTE — Progress Notes (Signed)
eLink Physician-Brief Progress Note Patient Name: [name] DOB: 09/17/1940 MRN: [id number]  Date of Service  11/16/2011   HPI/Events of Note  Hypokalemia and hypomag   eICU Interventions  Potassium and magnesium replaced in the setting of renal insufficiency   Intervention Category Intermediate Interventions: Electrolyte abnormality - evaluation and management  Nastacia Raybuck 11/16/2011, 4:41 AM

## 2011-11-16 NOTE — Progress Notes (Signed)
PHARMACIST - PHYSICIAN COMMUNICATION  CONCERNING: Protonix IV to Oral Route Change Policy  RECOMMENDATION: This patient is receiving Protonix by the intravenous route.  Based on criteria approved by the Pharmacy and Therapeutics Committee, this is being converted to the equivalent oral dose form(s).   DESCRIPTION: These criteria include:  The patient is not neutropenic and does not exhibit a GI malabsorption state. Did not have GIB this admit.  The patient has been taking other orally administered medications for a least 24 hours  If you have questions about this conversion, please contact the Pharmacy Department  Render Marley K. Allena Katz, PharmD, BCPS.  Clinical Pharmacist Pager [phone]. 11/16/2011 9:33 AM

## 2011-11-16 NOTE — Progress Notes (Signed)
Name: [name] MRN: [id number] DOB: 09-08-1940    LOS: 2  Referring Provider:  Oletta Lamas Reason for Referral:  Acute on chronic respiratory failure  PULMONARY / CRITICAL CARE MEDICINE  HPI:  71 yo with a plethora of health problems - OHS/OSA, diastolic and systolic CHF, COPD, DM, HTN. Discharged 3 days prior to this admit from SNF. Presents with hypercarbic resp failure and required intubation 9/22. PCCM will admit to ICU  Events Since Admission: 9/22 intubated  Current Status: Guarded  Interval Events:  Awake on WUA today Good diuresis last 24 hours 1 of 2 bottles GPC  Vital Signs: Temp:  [97.6 F (36.4 C)-99.8 F (37.7 C)] 99.8 F (37.7 C) (09/24 1230) Pulse Rate:  [66-83] 75  (09/24 1504) Resp:  [0-31] 31  (09/24 1504) BP: (113-178)/(50-126) 177/71 mmHg (09/24 1504) SpO2:  [93 %-100 %] 98 % (09/24 1504) Arterial Line BP: (152)/(59) 152/59 mmHg (09/23 2000) FiO2 (%):  [30 %-40 %] 30 % (09/24 1504) Weight:  [95 kg (209 lb 7 oz)] 95 kg (209 lb 7 oz) (09/24 0440)  Intake/Output Summary (Last 24 hours) at 11/16/11 1511 Last data filed at 11/16/11 1500  Gross per 24 hour  Intake   1149 ml  Output   2890 ml  Net  -1741 ml    Physical Examination: General: MO AAF, intubated Neuro:  Wakes easily, follows commands, moves al ext HEENT:  No LAN, cushingoid apperance Neck:  Thick neck, JVD difficult to assess Cardiovascular:  HSD Lungs:  Distant, B crackles Abdomen: obese +bs Musculoskeletal:  intact Skin:  warm  Active Problems:  Type 2 diabetes mellitus with vascular disease  Hyperlipdemia  Obesity (BMI 30-39.9)  TOBACCO ABUSE  Sleep apnea  Hypertensive heart disease with congestive heart failure  CORONARY ARTERY DISEASE  Chronic cor pulmonale  ASTHMA  C O P D  Chronic kidney disease (CKD), stage IV (severe)  Acute systolic congestive heart failure  Acute respiratory failure with hypercapnia  Cardiomyopathy of undetermined type  History of gout  Bifascicular block  Other chronic pulmonary heart diseases  Acute on chronic renal failure  Anemia  Swelling of extremity, right  Acute on chronic systolic heart failure   ASSESSMENT AND PLAN  PULMONARY  Lab 11/15/11 0320 11/14/11 1700  PHART 7.505* 7.481*  PCO2ART 41.0 48.6*  PO2ART 90.4 223.0*  HCO3 32.1* 36.3*  O2SAT 99.3 100.0   Ventilator Settings: Vent Mode:  [-] CPAP FiO2 (%):  [30 %-40 %] 30 % Set Rate:  [16 bmp] 16 bmp Vt Set:  [500 mL] 500 mL PEEP:  [5 cmH20] 5 cmH20 Pressure Support:  [5 cmH20-12 cmH20] 12 cmH20 Plateau Pressure:  [19 cmH20-20 cmH20] 20 cmH20 CXR:   Dg Chest Port 1 View  11/16/2011  *RADIOLOGY REPORT*  Clinical Data: Evaluate endotracheal tube position.  PORTABLE CHEST - 1 VIEW  Comparison: 11/15/2011.  Findings: The endotracheal tube tip is deep, 7 mm from the carina. This should be withdrawn 2 cm.  The nasogastric tube remains present. Monitoring leads are projected over the chest.  Right basilar airspace disease and atelectasis persists, similar to prior exam.  Retrocardiac density is also unchanged, likely atelectasis and effusion.  Retrocardiac airspace disease is not excluded.  IMPRESSION:  1.  Endotracheal tube position the at 7 mm from the carina.  This tube should be withdrawn 2 cm for better positioning. 2.  Unchanged pulmonary aeration with right basilar airspace disease and dense retrocardiac opacity. 3.  Nasogastric tube remains unchanged.  This  is a call report.   Original Report Authenticated By: Andreas Newport, M.D.    Portable Chest Xray In Am  11/15/2011  *RADIOLOGY REPORT*  Clinical Data: Evaluate support apparatus.  Pulmonary infiltrates. Short of breath.  PORTABLE CHEST - 1 VIEW  Comparison: 11/14/2011.  Findings: Endotracheal tube is 14 mm from the carina.  Nasogastric tube is present with the tip not visualized.  Pulmonary aeration appears little changed with dense retrocardiac opacity, likely effusion and atelectasis.  Retrocardiac  airspace disease cannot be excluded.  The airspace disease is present in the right perihilar and right basilar regions which may represent pneumonia or pulmonary edema.  IMPRESSION:  1.  Endotracheal tube 14 mm from the carina. 2.  Introduction of nasogastric tube with the tip not visualized. 3.  Unchanged pulmonary aeration with retrocardiac consolidation and perihilar and right basilar airspace disease.   Original Report Authenticated By: Andreas Newport, M.D.    Portable Chest Xray  11/14/2011  *RADIOLOGY REPORT*  Clinical Data: Endotracheal tube placement  PORTABLE CHEST - 1 VIEW  Comparison: Prior films same day  Findings: Cardiomegaly again noted.  Persistent bilateral small pleural effusion with bilateral basilar atelectasis or infiltrate. Stable central vascular congestion.  Endotracheal tube with tip at the level of the carina.  The endotracheal tube should be retracted about 1.5 cm. No diagnostic pneumothorax.  Lung apices are obscured by overlying patient's chin .  IMPRESSION: Bilateral small pleural effusion and bilateral basilar atelectasis or infiltrate.  Cardiomegaly again noted.  Endotracheal tube with tip at the level of the carina.  Endotracheal tube should be retracted about 1.5 cm.   Original Report Authenticated By: Natasha Mead, M.D.     ETT:  9/22>>  A:  VDRF in setting of OSA ,noncompliant w CPAP, diastolic and systolic CHF P:   See vent orders, daily WUA and SBT BD's Continue demadex as patients can often respond differently to this than lasix Additional lasix ordered 40mg  IV bid  CARDIOVASCULAR  Lab 11/16/11 0336 11/15/11 0409 11/14/11 2035 11/14/11 1514 11/14/11 1313  TROPONINI -- 0.44* 0.41* <0.30 --  LATICACIDVEN -- 0.7 -- -- --  PROBNP 36929.0* 39975.0* -- -- 45375.0*   ECG:   Lines:   A: CAD./CHF/Bifasicular block  A: troponin +, likely secondary to AE-CHF P:  Diuresis w both demadex and lasix as above. Will consult cardiology prior to discharge, known to  [facility]F clinic Continue coreg, ntg paste Hydralazine prn  RENAL  Lab 11/16/11 0336 11/15/11 0409 11/14/11 1313  NA 144 141 143  K 3.3* 3.7 --  CL 102 99 101  CO2 32 32 35*  BUN 37* 41* 41*  CREATININE 2.07* 2.32* 2.61*  CALCIUM 8.9 9.1 9.3  MG 1.4* 1.6 --  PHOS 2.8 2.5 --   Intake/Output      09/23 0701 - 09/24 0700 09/24 0701 - 09/25 0700   I.V. (mL/kg) 1009.2 (10.6) 160 (1.7)   Other     NG/GT 20 90   IV Piggyback 260    Total Intake(mL/kg) 1289.2 (13.6) 250 (2.6)   Urine (mL/kg/hr) 4075 (1.8) 665 (0.9)   Total Output 4075 665   Net -2785.8 -415         Foley:  9/22 >>   A: CRI hypokalemia P:   Renal consult prior to discharge, followed by Dr Eliott Nine Replaced K, Mg  GASTROINTESTINAL  Lab 11/14/11 1313  AST 13  ALT 9  ALKPHOS 58  BILITOT 0.2*  PROT 7.0  ALBUMIN 3.4*  A: NPO/place ogt P:   Nutrition consulted, will start TF 9/25 if we believe she will require MV for several days  HEMATOLOGIC  Lab 11/16/11 0336 11/15/11 0409 11/14/11 1313  HGB 9.3* 9.4* 10.3*  HCT 30.4* 31.5* 37.2  PLT 123* 139* 138*  INR -- -- 1.04  APTT -- -- 28   A:  Anemia P:  Monitor cbc  INFECTIOUS  Lab 11/16/11 0336 11/15/11 0409 11/14/11 1313  WBC 9.8 9.6 9.1  PROCALCITON -- <0.10 --   Cultures: 9/22 sputum>> normal flora 9/22 uc>> candida 9/22 bc>> 1 of 2 bottles GPC >>   Antibiotics: vanco 9/23 >>   A:  GPC on 1 of 2 cx, suspect contaminant P:   - cover with vanco until speciated  ENDOCRINE  Lab 11/16/11 1229 11/16/11 0812 11/16/11 0315 11/15/11 2323 11/15/11 2001  GLUCAP 97 89 87 82 85   A:  DM P:   SSI protocol  NEUROLOGIC  A:  AMS due to hypercapnia >> resolved P:   - follow  BEST PRACTICE / DISPOSITION Level of Care:  ICU Primary Service:  pccm Consultants:   Code Status:  full Diet:  npo DVT Px:  heparin GI Px:  ppi Skin Integrity:  intact Social / Family:  Updated at bedside  CC time 30 minutes  Leslye Peer.,  M.D. Pulmonary and Critical Care Medicine [facility] Pager: [phone]  11/16/2011, 3:11 PM

## 2011-11-16 NOTE — Care Management Note (Signed)
    Page 1 of 2   11/19/2011     2:55:50 PM   CARE MANAGEMENT NOTE 11/19/2011  Patient:  [name], [name]   Account Number:  [id number]  Date Initiated:  11/15/2011  Documentation initiated by:  [facility]  Subjective/Objective Assessment:   REsp failure.     Action/Plan:   Anticipated DC Date:  11/22/2011   Anticipated DC Plan:  LONG TERM ACUTE CARE (LTAC)      DC Planning Services  CM consult      Choice offered to / List presented to:             Status of service:  In process, will continue to follow Medicare Important Message given?   (If response is "NO", the following Medicare IM given date fields will be blank) Date Medicare IM given:   Date Additional Medicare IM given:    Discharge Disposition:    Per UR Regulation:  Reviewed for med. necessity/level of care/duration of stay  If discussed at Long Length of Stay Meetings, dates discussed:    Comments:  ContactSunday Corn Daughter     [phone]                 Posey,Robin Relative     [phone]  11-19-11 1:45pm Avie Arenas, RNBSN [phone] Remains extubated - failed swallowing test, refusing bipap. In progression suggested she would be a good candidate for LTach to follow up on chronic issues.  Insurance has approved.   Attempted to talk to daughter, not in room - mom states she works, left message on phone.  Talked with daughter in room - upset because mom still not doing better.  Supported daughters feelings. Planning for bronch this afternoon - now on 100% NRB.  Discussed possible rehab options with daughter and Mom.  Daughter would like [facility] - [facility] - discharge for rehab.  Does not want her to go back to [place].  [facility] updated.  Updated Dr. Delton Coombes that if patient ready for tx to [facility] this w/e can take.  11-18-11 8:30am Avie Arenas, RNBSN [phone] Hoping to extubate on 9-25 - failed.  ?? [facility] referral. Spoke with physician.  Extubated, but very wheezy.  11-15-11 11am Avie Arenas,  RNBSN [phone] Spoke with daughter at bedside.  States patient discharged from SNF to live at her house on 11-11-11.  Went out to dinner that day, Mom doing well.  Assisted her to bed and the next morning patient coughing, not doing as well. Continued to decline becoming more and more lethargic. Prior to coming back to hospital, patient told daughter that she needed to go back to SNF to live as she was too much work for daughter.  Daughter does work M-F.   Patient still intubated.  SW consult placed.

## 2011-11-17 ENCOUNTER — Inpatient Hospital Stay (HOSPITAL_COMMUNITY): Payer: Medicare Other

## 2011-11-17 LAB — GLUCOSE, CAPILLARY
Glucose-Capillary: 84 mg/dL (ref 70–99)
Glucose-Capillary: 86 mg/dL (ref 70–99)
Glucose-Capillary: 97 mg/dL (ref 70–99)

## 2011-11-17 LAB — BASIC METABOLIC PANEL
BUN: 37 mg/dL — ABNORMAL HIGH (ref 6–23)
Calcium: 8.9 mg/dL (ref 8.4–10.5)
Chloride: 103 mEq/L (ref 96–112)
Creatinine, Ser: 2.35 mg/dL — ABNORMAL HIGH (ref 0.50–1.10)
GFR calc Af Amer: 23 mL/min — ABNORMAL LOW (ref >90)
GFR calc non Af Amer: 20 mL/min — ABNORMAL LOW (ref >90)

## 2011-11-17 LAB — CBC
MCHC: 30.9 g/dL (ref 30.0–36.0)
Platelets: 124 10*3/uL — ABNORMAL LOW (ref 150–400)
RDW: 17.6 % — ABNORMAL HIGH (ref 11.5–15.5)
WBC: 11.6 10*3/uL — ABNORMAL HIGH (ref 4.0–10.5)

## 2011-11-17 LAB — CULTURE, BLOOD (ROUTINE X 2)

## 2011-11-17 LAB — POCT I-STAT 3, ART BLOOD GAS (G3+)
Bicarbonate: 38.9 mEq/L — ABNORMAL HIGH (ref 20.0–24.0)
O2 Saturation: 93 %
Patient temperature: 98.6
TCO2: 42 mmol/L (ref 0–100)

## 2011-11-17 LAB — PRO B NATRIURETIC PEPTIDE: Pro B Natriuretic peptide (BNP): 22772 pg/mL — ABNORMAL HIGH (ref 0–125)

## 2011-11-17 NOTE — Progress Notes (Signed)
Clinical Social Worker received referral indicating pt is from SNF.  CSW reviewed chart and staffed case with RNCM.  Pt may be a LTAC candidate due to readmits.  Pt currently intubated and unable to participate in assessment.  CSW to continue to follow and assist as needed. Full assessment to follow once medically appropriate.   Angelia Mould, MSW, [place] [phone]

## 2011-11-17 NOTE — Progress Notes (Signed)
Name: [name] MRN: [id number] DOB: Jun 27, 1940    LOS: 3  Referring Provider:  Oletta Lamas Reason for Referral:  Acute on chronic respiratory failure  PULMONARY / CRITICAL CARE MEDICINE  HPI:  71 yo with a plethora of health problems - OHS/OSA, diastolic and systolic CHF, COPD, DM, HTN. Discharged 3 days prior to this admit from SNF. Presents with hypercarbic resp failure and required intubation 9/22. PCCM will admit to ICU  Events Since Admission: 9/22 intubated  Current Status: Guarded  Interval Events:  Awake on WUA today Good diuresis last 48 hours   Vital Signs: Temp:  [99.1 F (37.3 C)-100.1 F (37.8 C)] 99.8 F (37.7 C) (09/25 1231) Pulse Rate:  [65-81] 73  (09/25 1218) Resp:  [12-31] 28  (09/25 1218) BP: (95-197)/(60-88) 178/73 mmHg (09/25 1218) SpO2:  [92 %-100 %] 98 % (09/25 1415) Arterial Line BP: (138-180)/(54-74) 159/66 mmHg (09/25 1200) FiO2 (%):  [30 %-40 %] 30 % (09/25 1415)  Intake/Output Summary (Last 24 hours) at 11/17/11 1420 Last data filed at 11/17/11 1100  Gross per 24 hour  Intake    604 ml  Output   1020 ml  Net   -416 ml    Physical Examination: General: MO AAF, intubated Neuro:  Wakes easily, follows commands, moves al ext HEENT:  No LAN, cushingoid apperance Neck:  Thick neck, JVD difficult to assess Cardiovascular:  HSD Lungs:  Distant, B crackles Abdomen: obese +bs Musculoskeletal:  intact Skin:  warm  Active Problems:  Type 2 diabetes mellitus with vascular disease  Hyperlipdemia  Obesity (BMI 30-39.9)  TOBACCO ABUSE  Sleep apnea  Hypertensive heart disease with congestive heart failure  CORONARY ARTERY DISEASE  Chronic cor pulmonale  ASTHMA  C O P D  Chronic kidney disease (CKD), stage IV (severe)  Acute systolic congestive heart failure  Acute respiratory failure with hypercapnia  Cardiomyopathy of undetermined type  History of gout  Bifascicular block  Other chronic pulmonary heart diseases  Acute on chronic  renal failure  Anemia  Swelling of extremity, right  Acute on chronic systolic heart failure   ASSESSMENT AND PLAN  PULMONARY  Lab 11/15/11 0320 11/14/11 1700 11/14/11 1341  PHART 7.505* 7.481* 7.147*  PCO2ART 41.0 48.6* 112.5*  PO2ART 90.4 223.0* 91.0  HCO3 32.1* 36.3* 38.9*  O2SAT 99.3 100.0 93.0   Ventilator Settings: Vent Mode:  [-] CPAP FiO2 (%):  [30 %-40 %] 30 % Set Rate:  [16 bmp] 16 bmp Vt Set:  [500 mL] 500 mL PEEP:  [5 cmH20] 5 cmH20 Pressure Support:  [5 cmH20-12 cmH20] 5 cmH20 Plateau Pressure:  [16 cmH20-20 cmH20] 19 cmH20 CXR:   Dg Chest Port 1 View  11/17/2011  *RADIOLOGY REPORT*  Clinical Data: Evaluate endotracheal tube.  PORTABLE CHEST - 1 VIEW  Comparison: 11/16/2011.  Findings: Endotracheal tube remain slightly the at 12 mm from the carina.  Pulmonary aeration appears little changed compared to prior. No pneumothorax. Monitoring leads are projected over the chest.  Nasogastric tube is present.  IMPRESSION:  1.  Endotracheal tube tip 12 mm from the carina.  Other support apparatus unchanged. 2.  Unchanged pulmonary aeration.   Original Report Authenticated By: Andreas Newport, M.D.    Dg Chest Port 1 View  11/16/2011  *RADIOLOGY REPORT*  Clinical Data: Evaluate endotracheal tube position.  PORTABLE CHEST - 1 VIEW  Comparison: 11/15/2011.  Findings: The endotracheal tube tip is deep, 7 mm from the carina. This should be withdrawn 2 cm.  The nasogastric tube  remains present. Monitoring leads are projected over the chest.  Right basilar airspace disease and atelectasis persists, similar to prior exam.  Retrocardiac density is also unchanged, likely atelectasis and effusion.  Retrocardiac airspace disease is not excluded.  IMPRESSION:  1.  Endotracheal tube position the at 7 mm from the carina.  This tube should be withdrawn 2 cm for better positioning. 2.  Unchanged pulmonary aeration with right basilar airspace disease and dense retrocardiac opacity. 3.  Nasogastric  tube remains unchanged.  This is a call report.   Original Report Authenticated By: Andreas Newport, M.D.     ETT:  9/22>>  A:  VDRF in setting of OSA ,noncompliant w CPAP, diastolic and systolic CHF P:   See vent orders, daily WUA and SBT, possible extubation 9/25 BD's Continue demadex as patients can often respond differently to this than lasix Additional lasix ordered 40mg  IV bid  CARDIOVASCULAR  Lab 11/17/11 0449 11/16/11 0336 11/15/11 0409 11/14/11 2035 11/14/11 1514 11/14/11 1313  TROPONINI -- -- 0.44* 0.41* <0.30 --  LATICACIDVEN -- -- 0.7 -- -- --  PROBNP 22772.0* 36929.0* 39975.0* -- -- 45375.0*   ECG:   Lines:   A: CAD./CHF/Bifasicular block  A: troponin +, likely secondary to AE-CHF P:  Diuresis w both demadex and lasix as above. Will consult cardiology prior to discharge, known to [facility] CHF clinic Continue coreg, ntg paste Hydralazine prn  RENAL  Lab 11/17/11 0449 11/16/11 0336 11/15/11 0409 11/14/11 1313  NA 145 144 141 143  K 3.8 3.3* -- --  CL 103 102 99 101  CO2 31 32 32 35*  BUN 37* 37* 41* 41*  CREATININE 2.35* 2.07* 2.32* 2.61*  CALCIUM 8.9 8.9 9.1 9.3  MG -- 1.4* 1.6 --  PHOS -- 2.8 2.5 --   Intake/Output      09/24 0701 - 09/25 0700 09/25 0701 - 09/26 0700   I.V. (mL/kg) 460 (4.8) 80 (0.8)   NG/GT 90    IV Piggyback 200 4   Total Intake(mL/kg) 750 (7.9) 84 (0.9)   Urine (mL/kg/hr) 1485 (0.7) 200 (0.3)   Total Output 1485 200   Net -735 -116         Foley:  9/22 >>   A: CRI hypokalemia P:   Renal consult prior to discharge, followed by Dr Eliott Nine Replaced K, Mg  GASTROINTESTINAL  Lab 11/14/11 1313  AST 13  ALT 9  ALKPHOS 58  BILITOT 0.2*  PROT 7.0  ALBUMIN 3.4*    A: NPO/place ogt P:   Nutrition consulted, will start TF 9/26 if we believe she will require MV for several days  HEMATOLOGIC  Lab 11/17/11 0449 11/16/11 0336 11/15/11 0409 11/14/11 1313  HGB 9.0* 9.3* 9.4* 10.3*  HCT 29.1* 30.4* 31.5* 37.2  PLT 124* 123*  139* 138*  INR -- -- -- 1.04  APTT -- -- -- 28   A:  Anemia P:  Monitor cbc  INFECTIOUS  Lab 11/17/11 0449 11/16/11 0336 11/15/11 0409 11/14/11 1313  WBC 11.6* 9.8 9.6 9.1  PROCALCITON -- -- <0.10 --   Cultures: 9/22 sputum>> normal flora 9/22 uc>> candida 9/22 bc>> 1 of 2 bottles GPC >> micrococcus sp  Antibiotics: vanco 9/23 >> 9/25  A:  GPC on 1 of 2 cx, micrococcus P:   - likely contaminant, will d/c vanco 9/25 and follow  ENDOCRINE  Lab 11/17/11 1220 11/17/11 0816 11/17/11 0401 11/16/11 2325 11/16/11 1957  GLUCAP 93 81 88 83 91   A:  DM P:   SSI protocol  NEUROLOGIC  A:  AMS due to hypercapnia >> resolved P:   - follow  BEST PRACTICE / DISPOSITION Level of Care:  ICU Primary Service:  pccm Consultants:   Code Status:  full Diet:  npo DVT Px:  heparin GI Px:  ppi Skin Integrity:  intact Social / Family:  Updated at bedside  CC time 30 minutes  Leslye Peer., M.D. Pulmonary and Critical Care Medicine [facility] Pager: [phone]  11/17/2011, 2:20 PM

## 2011-11-18 ENCOUNTER — Encounter (HOSPITAL_COMMUNITY): Payer: Self-pay | Admitting: General Practice

## 2011-11-18 ENCOUNTER — Inpatient Hospital Stay (HOSPITAL_COMMUNITY): Payer: Medicare Other

## 2011-11-18 LAB — CBC
HCT: 30.3 % — ABNORMAL LOW (ref 36.0–46.0)
Hemoglobin: 9.4 g/dL — ABNORMAL LOW (ref 12.0–15.0)
RBC: 3.74 MIL/uL — ABNORMAL LOW (ref 3.87–5.11)
WBC: 10.4 10*3/uL (ref 4.0–10.5)

## 2011-11-18 LAB — BASIC METABOLIC PANEL
CO2: 31 mEq/L (ref 19–32)
Calcium: 9.1 mg/dL (ref 8.4–10.5)
Chloride: 102 mEq/L (ref 96–112)
Glucose, Bld: 82 mg/dL (ref 70–99)
Sodium: 145 mEq/L (ref 135–145)

## 2011-11-18 LAB — GLUCOSE, CAPILLARY
Glucose-Capillary: 82 mg/dL (ref 70–99)
Glucose-Capillary: 96 mg/dL (ref 70–99)
Glucose-Capillary: 88 mg/dL (ref 70–99)

## 2011-11-18 LAB — MAGNESIUM: Magnesium: 1.7 mg/dL (ref 1.5–2.5)

## 2011-11-18 LAB — PHOSPHORUS: Phosphorus: 3.4 mg/dL (ref 2.3–4.6)

## 2011-11-18 MED ORDER — IPRATROPIUM BROMIDE 0.02 % IN SOLN
0.5000 mg | Freq: Four times a day (QID) | RESPIRATORY_TRACT | Status: DC
  Administered 2011-11-18 – 2011-11-22 (×16): 0.5 mg via RESPIRATORY_TRACT
  Filled 2011-11-18 (×16): qty 2.5

## 2011-11-18 MED ORDER — PANTOPRAZOLE SODIUM 40 MG IV SOLR
40.0000 mg | INTRAVENOUS | Status: DC
  Administered 2011-11-18 – 2011-11-23 (×5): 40 mg via INTRAVENOUS
  Filled 2011-11-18 (×7): qty 40

## 2011-11-18 MED ORDER — RACEPINEPHRINE HCL 2.25 % IN NEBU
INHALATION_SOLUTION | RESPIRATORY_TRACT | Status: AC
  Administered 2011-11-18: 0.5 mL
  Filled 2011-11-18: qty 0.5

## 2011-11-18 MED ORDER — ALBUTEROL SULFATE (5 MG/ML) 0.5% IN NEBU
2.5000 mg | INHALATION_SOLUTION | RESPIRATORY_TRACT | Status: DC | PRN
  Administered 2011-11-25: 2.5 mg via RESPIRATORY_TRACT
  Filled 2011-11-18 (×2): qty 0.5

## 2011-11-18 MED ORDER — POTASSIUM CHLORIDE 20 MEQ/15ML (10%) PO LIQD
40.0000 meq | Freq: Once | ORAL | Status: AC
  Administered 2011-11-18: 40 meq
  Filled 2011-11-18: qty 30

## 2011-11-18 MED ORDER — ALBUTEROL SULFATE (5 MG/ML) 0.5% IN NEBU
2.5000 mg | INHALATION_SOLUTION | Freq: Four times a day (QID) | RESPIRATORY_TRACT | Status: DC
  Administered 2011-11-18 – 2011-11-22 (×16): 2.5 mg via RESPIRATORY_TRACT
  Filled 2011-11-18 (×15): qty 0.5

## 2011-11-18 MED ORDER — ALBUTEROL SULFATE (5 MG/ML) 0.5% IN NEBU: 2.5000 mg | INHALATION_SOLUTION | RESPIRATORY_TRACT | Status: DC

## 2011-11-18 MED ORDER — TORSEMIDE 20 MG PO TABS
20.0000 mg | ORAL_TABLET | Freq: Every day | ORAL | Status: DC
  Administered 2011-11-21 – 2011-11-25 (×5): 20 mg via ORAL
  Filled 2011-11-18 (×7): qty 1

## 2011-11-18 MED ORDER — IPRATROPIUM BROMIDE 0.02 % IN SOLN: 0.5000 mg | Freq: Four times a day (QID) | RESPIRATORY_TRACT | Status: DC

## 2011-11-18 MED ORDER — NICOTINE 7 MG/24HR TD PT24
7.0000 mg | MEDICATED_PATCH | TRANSDERMAL | Status: DC
  Administered 2011-11-18 – 2011-11-23 (×5): 7 mg via TRANSDERMAL
  Filled 2011-11-18 (×7): qty 1

## 2011-11-18 MED ORDER — POTASSIUM CHLORIDE 20 MEQ/15ML (10%) PO LIQD
ORAL | Status: AC
  Filled 2011-11-18: qty 30

## 2011-11-18 MED ORDER — METHYLPREDNISOLONE SODIUM SUCC 125 MG IJ SOLR
60.0000 mg | Freq: Once | INTRAMUSCULAR | Status: AC
  Administered 2011-11-18: 60 mg via INTRAVENOUS
  Filled 2011-11-18: qty 0.96

## 2011-11-18 NOTE — Progress Notes (Signed)
[facility] ADULT ICU REPLACEMENT PROTOCOL FOR AM LAB REPLACEMENT ONLY  The patient does not apply for the [facility] Adult ICU Electrolyte Replacment Protocol based on the criteria listed below:   1. Is GFR >/= 50 ml/min? no  Patient's GFR today is 20  6. If a panic level lab has been reported, has the CCM MD in charge been notified? yes.   Physician:  Kela Millin, Lelon Perla 11/18/2011 5:12 AM

## 2011-11-18 NOTE — Progress Notes (Signed)
Clinical Social Worker met with pt briefly at bedside; pt recently extubated and not able to fully participate in assessment.  No family at bedside.  CSW to continue to follow and assist as needed.   Angelia Mould, MSW, [place] [phone]

## 2011-11-18 NOTE — Procedures (Signed)
Extubation Procedure Note  Patient Details:   Name: [name] DOB: [birth year] MRN: [id number]   Airway Documentation:  Airway 7.5 mm (Active)  Secured at (cm) 24 cm 11/18/2011  8:00 AM  Measured From Lips 11/18/2011  8:00 AM  Secured Location Right 11/18/2011  7:46 AM  Secured By [employer] 11/18/2011  8:00 AM  Tube Holder Repositioned Yes 11/18/2011  7:46 AM  Cuff Pressure (cm H2O) 22 cm H2O 11/17/2011  4:45 PM  Site Condition Dry 11/17/2011  4:45 PM    Evaluation  O2 sats: stable throughout Complications: No apparent complications Patient did tolerate procedure well. Bilateral Breath Sounds: Clear;Diminished HR 67 RR 17 02 sats 99 RR Suctioning: Airway Yes  Ave Filter 11/18/2011, 11:38 AM

## 2011-11-18 NOTE — Progress Notes (Signed)
eLink Physician-Brief Progress Note Patient Name: [name] DOB: [birth year] MRN: [id number]  Date of Service  11/18/2011   HPI/Events of Note     eICU Interventions  Hypokalemia -repleted    Intervention Category Minor Interventions: Electrolytes abnormality - evaluation and management  ALVA,RAKESH V. 11/18/2011, 5:30 AM

## 2011-11-18 NOTE — Progress Notes (Signed)
Patient failed stroke swallow screen. Held PO meds. Dr. Delton Coombes notified.   Kathlene Cote A RN

## 2011-11-18 NOTE — Progress Notes (Signed)
Name: [name] MRN: [id number] DOB: Jun 07, 1940    LOS: 4  Referring Provider:  Oletta Lamas Reason for Referral:  Acute on chronic respiratory failure  PULMONARY / CRITICAL CARE MEDICINE  HPI:  71 yo with a plethora of health problems - OHS/OSA, diastolic and systolic CHF, COPD, DM, HTN. Discharged 3 days prior to this admit from SNF. Presents with hypercarbic resp failure and required intubation 9/22. PCCM will admit to ICU  Events Since Admission: 9/22 intubated  Current Status: Guarded  Interval Events:  Awake on WUA today Good diuresis   Vital Signs: Temp:  [98.7 F (37.1 C)-99.9 F (37.7 C)] 98.7 F (37.1 C) (09/26 1214) Pulse Rate:  [60-77] 77  (09/26 1400) Resp:  [15-29] 27  (09/26 1400) BP: (125-184)/(51-97) 125/64 mmHg (09/26 1400) SpO2:  [92 %-100 %] 95 % (09/26 1421) Arterial Line BP: (162-169)/(66-73) 169/73 mmHg (09/25 1700) FiO2 (%):  [30 %] 30 % (09/26 1100)  Intake/Output Summary (Last 24 hours) at 11/18/11 1449 Last data filed at 11/18/11 1400  Gross per 24 hour  Intake    304 ml  Output   1385 ml  Net  -1081 ml    Physical Examination: General: MO AAF, intubated Neuro:  Wakes easily, follows commands, moves al ext HEENT:  No LAN, cushingoid apperance Neck:  Thick neck, JVD difficult to assess Cardiovascular:  HSD Lungs:  Distant, B crackles Abdomen: obese +bs Musculoskeletal:  intact Skin:  warm  Active Problems:  Type 2 diabetes mellitus with vascular disease  Hyperlipdemia  Obesity (BMI 30-39.9)  TOBACCO ABUSE  Sleep apnea  Hypertensive heart disease with congestive heart failure  CORONARY ARTERY DISEASE  Chronic cor pulmonale  ASTHMA  C O P D  Chronic kidney disease (CKD), stage IV (severe)  Acute systolic congestive heart failure  Acute respiratory failure with hypercapnia  Cardiomyopathy of undetermined type  History of gout  Bifascicular block  Other chronic pulmonary heart diseases  Acute on chronic renal failure  Anemia  Swelling of extremity, right  Acute on chronic systolic heart failure   ASSESSMENT AND PLAN  PULMONARY  Lab 11/15/11 0320 11/14/11 1700 11/14/11 1341  PHART 7.505* 7.481* 7.147*  PCO2ART 41.0 48.6* 112.5*  PO2ART 90.4 223.0* 91.0  HCO3 32.1* 36.3* 38.9*  O2SAT 99.3 100.0 93.0   Ventilator Settings: Vent Mode:  [-] CPAP;PSV FiO2 (%):  [30 %] 30 % Set Rate:  [16 bmp] 16 bmp Vt Set:  [500 mL] 500 mL PEEP:  [5 cmH20] 5 cmH20 Pressure Support:  [5 cmH20] 5 cmH20 Plateau Pressure:  [17 cmH20-29 cmH20] 29 cmH20 CXR:   Dg Chest Port 1 View  11/18/2011  *RADIOLOGY REPORT*  Clinical Data: Evaluate support devices  PORTABLE CHEST - 1 VIEW  Comparison: 11/17/2011  Findings: Endotracheal tube tip 1.3 cm proximal to the carina. Cardiomegaly. Aortic atherosclerotic calcification.  Bibasilar opacities appears similar to 11/16/2011 though slightly more prominent than yesterday.  Central vascular congestion.  NG tube descends below the level of the image.  No pneumothorax.  Small effusions not excluded.  No interval osseous change.  IMPRESSION: Endotracheal tube tip 1.3 cm proximal to the carina.  Cardiomegaly with central vascular congestion.  Bibasilar opacities are similar to the prior.   Original Report Authenticated By: Waneta Martins, M.D.    Dg Chest Port 1 View  11/17/2011  *RADIOLOGY REPORT*  Clinical Data: Evaluate endotracheal tube.  PORTABLE CHEST - 1 VIEW  Comparison: 11/16/2011.  Findings: Endotracheal tube remain slightly the at 12 mm  from the carina.  Pulmonary aeration appears little changed compared to prior. No pneumothorax. Monitoring leads are projected over the chest.  Nasogastric tube is present.  IMPRESSION:  1.  Endotracheal tube tip 12 mm from the carina.  Other support apparatus unchanged. 2.  Unchanged pulmonary aeration.   Original Report Authenticated By: Andreas Newport, M.D.     ETT:  9/22>>  A:  VDRF in setting of OSA ,noncompliant w CPAP, diastolic and  systolic CHF P:   See vent orders, daily WUA and SBT, possible extubation 9/26 BD's Continue demadex as patients can often respond differently to this than lasix Additional lasix ordered 40mg  IV bid as long as S Cr remains stable  CARDIOVASCULAR  Lab 11/17/11 0449 11/16/11 0336 11/15/11 0409 11/14/11 2035 11/14/11 1514 11/14/11 1313  TROPONINI -- -- 0.44* 0.41* <0.30 --  LATICACIDVEN -- -- 0.7 -- -- --  PROBNP 22772.0* 36929.0* 39975.0* -- -- 45375.0*   ECG:   Lines:   A: CAD./CHF/Bifasicular block  A: troponin +, likely secondary to AE-CHF P:  Diuresis w both demadex and lasix as above. Will consult cardiology prior to discharge, known to [facility] CHF clinic Continue coreg, ntg paste Hydralazine prn  RENAL  Lab 11/18/11 0325 11/17/11 0449 11/16/11 0336 11/15/11 0409 11/14/11 1313  NA 145 145 144 141 143  K 3.1* 3.8 -- -- --  CL 102 103 102 99 101  CO2 31 31 32 32 35*  BUN 37* 37* 37* 41* 41*  CREATININE 2.32* 2.35* 2.07* 2.32* 2.61*  CALCIUM 9.1 8.9 8.9 9.1 9.3  MG 1.7 -- 1.4* 1.6 --  PHOS 3.4 -- 2.8 2.5 --   Intake/Output      09/25 0701 - 09/26 0700 09/26 0701 - 09/27 0700   I.V. (mL/kg) 310 (3.3) 70 (0.7)   NG/GT  60   IV Piggyback 4 4   Total Intake(mL/kg) 314 (3.3) 134 (1.4)   Urine (mL/kg/hr) 1175 (0.5) 660 (0.9)   Total Output 1175 660   Net -861 -526        Stool Occurrence  1 x    Foley:  9/22 >>   A: CRI hypokalemia P:   Renal consult prior to discharge, followed by Dr Eliott Nine Replaced K, Mg  GASTROINTESTINAL  Lab 11/14/11 1313  AST 13  ALT 9  ALKPHOS 58  BILITOT 0.2*  PROT 7.0  ALBUMIN 3.4*    A: NPO/place ogt P:   eval for swallowing  HEMATOLOGIC  Lab 11/18/11 0325 11/17/11 0449 11/16/11 0336 11/15/11 0409 11/14/11 1313  HGB 9.4* 9.0* 9.3* 9.4* 10.3*  HCT 30.3* 29.1* 30.4* 31.5* 37.2  PLT 117* 124* 123* 139* 138*  INR -- -- -- -- 1.04  APTT -- -- -- -- 28   A:  Anemia P:  Monitor cbc  INFECTIOUS  Lab 11/18/11 0325  11/17/11 0449 11/16/11 0336 11/15/11 0409 11/14/11 1313  WBC 10.4 11.6* 9.8 9.6 9.1  PROCALCITON -- -- -- <0.10 --   Cultures: 9/22 sputum>> normal flora 9/22 uc>> candida 9/22 bc>> 1 of 2 bottles GPC >> micrococcus sp  Antibiotics: vanco 9/23 >> 9/25  A:  GPC on 1 of 2 cx, micrococcus P:   - likely contaminant, will d/c vanco 9/25 and follow  ENDOCRINE  Lab 11/18/11 1211 11/18/11 0809 11/18/11 0351 11/17/11 2333 11/17/11 2004  GLUCAP 96 92 82 84 97   A:  DM P:   SSI protocol  NEUROLOGIC  A:  AMS due to hypercapnia >> resolved P:   -  follow  BEST PRACTICE / DISPOSITION Level of Care:  ICU Primary Service:  pccm Consultants:   Code Status:  full Diet:  Start clears DVT Px:  heparin GI Px:  ppi Skin Integrity:  intact Social / Family:  Updated at bedside  CC time 30 minutes  Leslye Peer., M.D. Pulmonary and Critical Care Medicine [facility] Pager: [phone]  11/18/2011, 2:49 PM

## 2011-11-19 ENCOUNTER — Inpatient Hospital Stay (HOSPITAL_COMMUNITY): Payer: Medicare Other

## 2011-11-19 LAB — CBC
Platelets: 149 10*3/uL — ABNORMAL LOW (ref 150–400)
RBC: 3.98 MIL/uL (ref 3.87–5.11)
WBC: 9.9 10*3/uL (ref 4.0–10.5)

## 2011-11-19 LAB — GLUCOSE, CAPILLARY
Glucose-Capillary: 109 mg/dL — ABNORMAL HIGH (ref 70–99)
Glucose-Capillary: 116 mg/dL — ABNORMAL HIGH (ref 70–99)
Glucose-Capillary: 123 mg/dL — ABNORMAL HIGH (ref 70–99)

## 2011-11-19 LAB — BASIC METABOLIC PANEL
Calcium: 9.2 mg/dL (ref 8.4–10.5)
GFR calc Af Amer: 24 mL/min — ABNORMAL LOW (ref >90)
GFR calc non Af Amer: 20 mL/min — ABNORMAL LOW (ref >90)
Potassium: 4 mEq/L (ref 3.5–5.1)
Sodium: 148 mEq/L — ABNORMAL HIGH (ref 135–145)

## 2011-11-19 MED ORDER — LIDOCAINE HCL 1 % IJ SOLN
6.0000 mL | Freq: Once | INTRAMUSCULAR | Status: AC
  Administered 2011-11-19: 6 mL via RESPIRATORY_TRACT
  Filled 2011-11-19: qty 6

## 2011-11-19 MED ORDER — LIDOCAINE VISCOUS 2 % MT SOLN
20.0000 mL | Freq: Once | OROMUCOSAL | Status: AC
  Administered 2011-11-19: 20 mL via OROMUCOSAL
  Filled 2011-11-19: qty 20

## 2011-11-19 MED ORDER — LIDOCAINE HCL 2 % EX GEL
Freq: Once | CUTANEOUS | Status: AC | PRN
  Administered 2011-11-19: 1

## 2011-11-19 MED ORDER — MIDAZOLAM HCL 2 MG/2ML IJ SOLN
INTRAMUSCULAR | Status: AC
  Administered 2011-11-19: 4 mg via INTRAVENOUS
  Filled 2011-11-19: qty 4

## 2011-11-19 MED ORDER — PHENYLEPHRINE HCL 0.25 % NA SOLN
1.0000 | Freq: Four times a day (QID) | NASAL | Status: DC | PRN
  Administered 2011-11-19: 1 via NASAL
  Filled 2011-11-19: qty 15

## 2011-11-19 MED ORDER — WHITE PETROLATUM GEL
Status: AC
  Administered 2011-11-19: 1
  Filled 2011-11-19: qty 5

## 2011-11-19 NOTE — Progress Notes (Signed)
Name: [name] MRN: [id number] DOB: Nov 24, 1940    LOS: 5  Referring Provider:  Oletta Lamas Reason for Referral:  Acute on chronic respiratory failure  PULMONARY / CRITICAL CARE MEDICINE  HPI:  71 yo with a plethora of health problems - OHS/OSA, diastolic and systolic CHF, COPD, DM, HTN. Discharged 3 days prior to this admit from SNF. Presents with hypercarbic resp failure and required intubation 9/22. PCCM will admit to ICU  Events Since Admission: 9/22 >>> intubated 9/26 >>> extubated  Subjective: Patient was resting in bed in no acute distress with non re breather. Has no complaints of pain. Patient refused BiPAP night of 9/26.  Objective: Vital Signs: Temp:  [97.5 F (36.4 C)-99.7 F (37.6 C)] 97.6 F (36.4 C) (09/27 0808) Pulse Rate:  [57-87] 83  (09/27 0600) Resp:  [15-30] 30  (09/27 0600) BP: (125-169)/(42-93) 145/60 mmHg (09/27 0500) SpO2:  [77 %-100 %] 100 % (09/27 0841) FiO2 (%):  [30 %] 30 % (09/26 1100) Weight:  [84.3 kg (185 lb 13.6 oz)] 84.3 kg (185 lb 13.6 oz) (09/27 0500)  Intake/Output Summary (Last 24 hours) at 11/19/11 1035 Last data filed at 11/19/11 0700  Gross per 24 hour  Intake    268 ml  Output   1190 ml  Net   -922 ml    Physical Examination: General: NAD Neuro:  Wakes easily, follows commands, moves all ext HEENT:  No LAN, cushingoid apperance Neck:  Thick neck, Insp and exp noise worse when talking or anxious Cardiovascular:  HSD Lungs:  Distant, referred UA noise Abdomen: obese +bs Musculoskeletal:  intact Skin:  warm  ASSESSMENT AND PLAN  PULMONARY  Lab 11/15/11 0320 11/14/11 1700 11/14/11 1341  PHART 7.505* 7.481* 7.147*  PCO2ART 41.0 48.6* 112.5*  PO2ART 90.4 223.0* 91.0  HCO3 32.1* 36.3* 38.9*  O2SAT 99.3 100.0 93.0   Ventilator Settings: Vent Mode:  [-]  FiO2 (%):  [30 %] 30 % CXR:   Dg Chest Port 1 View  11/19/2011  *RADIOLOGY REPORT*  Clinical Data: Extubation.  Follow up bibasilar atelectasis and/or pneumonia.   PORTABLE CHEST - 1 VIEW  Comparison: Portable chest x-rays 11/18/2011 dating back to 11/15/2011.  Findings: Interval extubation and nasogastric tube removal. Interval development of complete opacification of the left hemithorax, with mediastinal shift to the left.  Improved aeration at the right lung base, with mild atelectasis persisting.  Right lung otherwise clear.  IMPRESSION: Interval development of near complete atelectasis of the left lung, presumably secondary to mucous plugging.  Improved aeration in the right lung base, with only mild atelectasis persisting.  These results will be called to the ordering clinician or representative by the Radiologist Assistant, and communication documented in the PACS Dashboard.   Original Report Authenticated By: Arnell Sieving, M.D.    Dg Chest Port 1 View  11/18/2011  *RADIOLOGY REPORT*  Clinical Data: Evaluate support devices  PORTABLE CHEST - 1 VIEW  Comparison: 11/17/2011  Findings: Endotracheal tube tip 1.3 cm proximal to the carina. Cardiomegaly. Aortic atherosclerotic calcification.  Bibasilar opacities appears similar to 11/16/2011 though slightly more prominent than yesterday.  Central vascular congestion.  NG tube descends below the level of the image.  No pneumothorax.  Small effusions not excluded.  No interval osseous change.  IMPRESSION: Endotracheal tube tip 1.3 cm proximal to the carina.  Cardiomegaly with central vascular congestion.  Bibasilar opacities are similar to the prior.   Original Report Authenticated By: Waneta Martins, M.D.     ETT:  9/22>> 9/26  A:   - VDRF in setting of OSA ,noncompliant w CPAP, diastolic and systolic CHF - Extubated 9/26. Refused BiPAP, and did not saturate well on 6L [place] overnight 9/26 - 9/27 CXR shows near atelectasis of left lung, ? 2/2 mucus plugging P:   - BD's - Continue demadex as patients can often respond differently to this than lasix - Additional lasix ordered 40mg  IV bid as long as S Cr  remains stable - PT. Patient needs to sit up in a chair to increase ventilation - incentive spirometry - consider percussive chest therapy to loosen mucus plugs vs bronch on 9/27 - NPO until passes swallow eval  CARDIOVASCULAR  Lab 11/17/11 0449 11/16/11 0336 11/15/11 0409 11/14/11 2035 11/14/11 1514 11/14/11 1313  TROPONINI -- -- 0.44* 0.41* <0.30 --  LATICACIDVEN -- -- 0.7 -- -- --  PROBNP 22772.0* 36929.0* 39975.0* -- -- 45375.0*   ECG:   Lines:   A:  - CAD./CHF/Bifasicular block  - troponin +, likely secondary to AE-CHF - elevated pro BNP P:  - Diuresis w both demadex and lasix as above. Will consult cardiology prior to discharge, - known to [facility] CHF clinic - Continue coreg, ntg paste - Hydralazine prn  RENAL  Lab 11/19/11 0349 11/18/11 0325 11/17/11 0449 11/16/11 0336 11/15/11 0409  NA 148* 145 145 144 141  K 4.0 3.1* -- -- --  CL 103 102 103 102 99  CO2 31 31 31  32 32  BUN 44* 37* 37* 37* 41*  CREATININE 2.28* 2.32* 2.35* 2.07* 2.32*  CALCIUM 9.2 9.1 8.9 8.9 9.1  MG -- 1.7 -- 1.4* 1.6  PHOS -- 3.4 -- 2.8 2.5   Intake/Output      09/26 0701 - 09/27 0700 09/27 0701 - 09/28 0700   I.V. (mL/kg) 290 (3.4)    NG/GT 60    IV Piggyback 12    Total Intake(mL/kg) 362 (4.3)    Urine (mL/kg/hr) 1565 (0.8)    Total Output 1565    Net -1203         Stool Occurrence 2 x     Foley:  9/22 >>   A:  - CRI - hypokalemia - resolved - mag, phos wnl - hypernatremia. Free water deficit of 2.41 L P:   - Renal consult prior to discharge, followed by Dr Eliott Nine - consider NGT placement for free water  GASTROINTESTINAL  Lab 11/14/11 1313  AST 13  ALT 9  ALKPHOS 58  BILITOT 0.2*  PROT 7.0  ALBUMIN 3.4*    A:  - NPO/place ogt - failed swallow eval P:   - speech therapy for MBS/FEES  HEMATOLOGIC  Lab 11/19/11 0349 11/18/11 0325 11/17/11 0449 11/16/11 0336 11/15/11 0409 11/14/11 1313  HGB 10.0* 9.4* 9.0* 9.3* 9.4* --  HCT 34.1* 30.3* 29.1* 30.4* 31.5* --    PLT 149* 117* 124* 123* 139* --  INR -- -- -- -- -- 1.04  APTT -- -- -- -- -- 28   A:   - Anemia - thrombocytopenia. ? Chronic  P:  - Monitor cbc  INFECTIOUS  Lab 11/19/11 0349 11/18/11 0325 11/17/11 0449 11/16/11 0336 11/15/11 0409  WBC 9.9 10.4 11.6* 9.8 9.6  PROCALCITON -- -- -- -- <0.10   Cultures: 9/22 sputum>> normal flora 9/22 Ucx>> candida 9/22 Bcx >> 1 of 2 bottles GPC >> micrococcus sp  Antibiotics: Vanco 9/23 >> 9/25  A:   - GPC on 1 of 2 cx, micrococcus. Likely contaminant - no  current Abx P:   - Trend WBCs  ENDOCRINE  Lab 11/19/11 0807 11/19/11 0355 11/19/11 0003 11/18/11 1956 11/18/11 1613  GLUCAP 96 123* 109* 88 88   A:   - DM - Glucose currently controlled P:   - SSI protocol  NEUROLOGIC  A:   - AMS due to hypercapnia >> resolved P:   - follow  BEST PRACTICE / DISPOSITION Level of Care:  ICU Primary Service:  pccm Consultants:   Code Status:  full Diet:  NPO, reassess after formal swallow eval DVT Px:  heparin GI Px:  ppi Skin Integrity:  intact Social / Family:  Updated at bedside   Verlin Grills, Medical Student Pulmonary and Critical Care Medicine 11/19/2011, 10:35 AM  Levy Pupa, MD, PhD 11/19/2011, 1:45 PM [place] [facility] [phone] or if no answer [phone]

## 2011-11-19 NOTE — Progress Notes (Signed)
Video Bronchoscopy done  Intervention Bronchial Washing done  Procedure tolerated well 

## 2011-11-19 NOTE — Progress Notes (Signed)
Clinical Social Worker staffed case with [employer].  Pt's insurance has approved LTACH.  CSW to sign off at this time, please re consult if needed.   Angelia Mould, MSW, [place] [phone]

## 2011-11-19 NOTE — Procedures (Signed)
Bronchoscopy Procedure Note CARLIN MAMONE [id number] 1940/05/10  Procedure: Bronchoscopy Indications: Obtain specimens for culture and/or other diagnostic studies and Remove secretions  Procedure Details Consent: Risks of procedure as well as the alternatives and risks of each were explained to the (patient/caregiver).  Consent for procedure obtained. Time Out: Verified patient identification, verified procedure, site/side was marked, verified correct patient position, special equipment/implants available, medications/allergies/relevent history reviewed, required imaging and test results available.  Performed  In preparation for procedure, patient was given 100% FiO2 and bronchoscope lubricated. Sedation: Benzodiazepines  Airway entered and the following bronchi were examined: RUL, RML, RLL, LUL and LLL.   Procedures performed: Brushings performed Bronchoscope removed.    Evaluation Hemodynamic Status: BP stable throughout; O2 sats: transiently fell during during procedure Patient's Current Condition: stable Specimens:  Sent purulent secretions for cx Complications: No apparent complications Patient did tolerate procedure well.   Levy Pupa, MD, PhD 11/19/2011, 4:38 PM [facility] [phone] or if no answer [phone]

## 2011-11-19 NOTE — Procedures (Signed)
Bedside Bronchoscopy Procedure Note [name] [id number] 07/21/1940  Procedure: Bronchoscopy Indications: Obtain specimens for culture and/or other diagnostic studies and remove secreations  Procedure Details: ET Tube Size:n/a ET Tube secured at lip (cm):n/a Bite block in place: No In preparation for procedure, Patient hyper-oxygenated with 100 % FiO2 Airway entered and the following bronchi were examined: Bronchi.   Patient placed back on 100% FiO2 at conclusion of procedure.    Evaluation BP 157/87  Pulse 72  Temp 97.8 F (36.6 C) (Oral)  Resp 25  Ht 5\' 4"  (1.626 m)  Wt 185 lb 13.6 oz (84.3 kg)  BMI 31.90 kg/m2  SpO2 100% Breath Sounds:Rhonch O2 sats: some desaturation during procedure had to bag mask pt to continue wit procedure Patient's Current Condition: stable Specimens:  Sent purulent fluid Complications: No apparent complications Patient did tolerate procedure well.    Bedside Bronchoscopy done  To obtain sample and remove secretions   [name] 11/19/2011, 3:50 PM

## 2011-11-19 NOTE — Progress Notes (Signed)
Nutrition Follow-up  Intervention:    Recommend swallow evaluation with SLP to determine safest diet consistency.  If unable to advance diet, recommend place enteral feeding tube and begin TF with Jevity 1.2 at 55 ml/h with Prostat 30 ml once daily to provide 1684 kcals, 88 grams protein, 1069 ml free water daily.  Assessment:   Patient was extubated on 9/26.  Is now receiving BiPAP as needed.  Failed stroke swallow evaluation per RN yesterday.  Diet Order:  NPO  Meds: Scheduled Meds:   . albuterol  2.5 mg Nebulization Q6H   And  . ipratropium  0.5 mg Nebulization Q6H  . antiseptic oral rinse  15 mL Mouth Rinse QID  . aspirin  81 mg Per Tube Daily  . atorvastatin  10 mg Oral QPM  . carvedilol  12.5 mg Oral BID WC  . chlorhexidine  15 mL Mouth Rinse BID  . furosemide  40 mg Intravenous BID  . heparin  5,000 Units Subcutaneous Q8H  . methylPREDNISolone (SOLU-MEDROL) injection  60 mg Intravenous Once  . nicotine  7 mg Transdermal Q24H  . nitroGLYCERIN  1 inch Topical Q6H  . pantoprazole (PROTONIX) IV  40 mg Intravenous Q24H  . paricalcitol  1 mcg Oral Daily  . Racepinephrine HCl      . torsemide  20 mg Oral Daily  . white petrolatum      . DISCONTD: albuterol  2.5 mg Nebulization Q4H  . DISCONTD: albuterol-ipratropium  6 puff Inhalation Q6H  . DISCONTD: ipratropium  0.5 mg Nebulization Q6H  . DISCONTD: nicotine  21 mg Transdermal Q24H  . DISCONTD: pantoprazole sodium  40 mg Per Tube Q1200  . DISCONTD: torsemide  20 mg Per Tube Daily   Continuous Infusions:   . sodium chloride 10 mL/hr at 11/18/11 1800  . DISCONTD: propofol Stopped (11/16/11 0200)   PRN Meds:.albuterol, fentaNYL, hydrALAZINE, labetalol, DISCONTD: albuterol, DISCONTD: midazolam  Labs:  CMP     Component Value Date/Time   NA 148* 11/19/2011 0349   K 4.0 11/19/2011 0349   CL 103 11/19/2011 0349   CO2 31 11/19/2011 0349   GLUCOSE 113* 11/19/2011 0349   BUN 44* 11/19/2011 0349   CREATININE 2.28* 11/19/2011  0349   CALCIUM 9.2 11/19/2011 0349   PROT 7.0 11/14/2011 1313   ALBUMIN 3.4* 11/14/2011 1313   AST 13 11/14/2011 1313   ALT 9 11/14/2011 1313   ALKPHOS 58 11/14/2011 1313   BILITOT 0.2* 11/14/2011 1313   GFRNONAA 20* 11/19/2011 0349   GFRAA 24* 11/19/2011 0349    CBG (last 3)   Basename 11/19/11 0807 11/19/11 0355 11/19/11 0003  GLUCAP 96 123* 109*    Sodium  Date/Time Value Range Status  11/19/2011  3:49 AM 148* 135 - 145 mEq/L Final  11/18/2011  3:25 AM 145  135 - 145 mEq/L Final  11/17/2011  4:49 AM 145  135 - 145 mEq/L Final    Potassium  Date/Time Value Range Status  11/19/2011  3:49 AM 4.0  3.5 - 5.1 mEq/L Final     DELTA CHECK NOTED     NO VISIBLE HEMOLYSIS  11/18/2011  3:25 AM 3.1* 3.5 - 5.1 mEq/L Final     DELTA CHECK NOTED  11/17/2011  4:49 AM 3.8  3.5 - 5.1 mEq/L Final     SLIGHT HEMOLYSIS    Phosphorus  Date/Time Value Range Status  11/18/2011  3:25 AM 3.4  2.3 - 4.6 mg/dL Final  1/61/0960  4:54 AM 2.8  2.3 -  4.6 mg/dL Final  1/61/0960  4:54 AM 2.5  2.3 - 4.6 mg/dL Final    Magnesium  Date/Time Value Range Status  11/18/2011  3:25 AM 1.7  1.5 - 2.5 mg/dL Final  0/98/1191  4:78 AM 1.4* 1.5 - 2.5 mg/dL Final  2/95/6213  0:86 AM 1.6  1.5 - 2.5 mg/dL Final     Intake/Output Summary (Last 24 hours) at 11/19/11 1017 Last data filed at 11/19/11 0700  Gross per 24 hour  Intake    268 ml  Output   1190 ml  Net   -922 ml    Weight Status:  84.3 kg down from 94.3 kg on 9/23 with negative fluid balance; BMI=31.9  Re-estimated needs:  1500-1700 kcals, 80-100 grams protein, > 1.7 liters fluid daily  Nutrition Dx:  Inadequate oral intake related to inability to eat as evidenced by NPO status, ongoing.  Goal:  Enteral nutrition to provide 60-70% of estimated calorie needs (22-25 kcals/kg ideal body weight) and >/= 90% of estimated protein needs, based on ASPEN guidelines for permissive underfeeding in critically ill obese individuals, no longer applicable.  New goal:   Intake to meet >90% of estimated nutrition needs, unmet.  Monitor for ability to advance PO diet, PO intake, weight trend, labs.   Joaquin Courts, RD, LDN, CNSC Pager# [phone] After Hours Pager# [phone]

## 2011-11-19 NOTE — Progress Notes (Signed)
0045. Pt's sats dropped into the 70's while on [place]. Does not seem to appear in respiratory distress. Mbr does have stridor and expiratory wheezes. Placed mbr on non rebreather sats increased to 90s. Dr. Darrick Penna notified

## 2011-11-19 NOTE — Progress Notes (Signed)
Levy Pupa, MD, PhD 11/19/2011, 5:54 PM [facility] [phone] or if no answer [phone]

## 2011-11-20 ENCOUNTER — Inpatient Hospital Stay (HOSPITAL_COMMUNITY): Payer: Medicare Other

## 2011-11-20 LAB — CBC
HCT: 34 % — ABNORMAL LOW (ref 36.0–46.0)
Hemoglobin: 9.7 g/dL — ABNORMAL LOW (ref 12.0–15.0)
MCH: 24.9 pg — ABNORMAL LOW (ref 26.0–34.0)
MCHC: 28.5 g/dL — ABNORMAL LOW (ref 30.0–36.0)
MCV: 87.4 fL (ref 78.0–100.0)
RDW: 17.5 % — ABNORMAL HIGH (ref 11.5–15.5)

## 2011-11-20 LAB — BASIC METABOLIC PANEL
BUN: 57 mg/dL — ABNORMAL HIGH (ref 6–23)
CO2: 34 mEq/L — ABNORMAL HIGH (ref 19–32)
Chloride: 104 mEq/L (ref 96–112)
Creatinine, Ser: 2.46 mg/dL — ABNORMAL HIGH (ref 0.50–1.10)
Glucose, Bld: 75 mg/dL (ref 70–99)
Potassium: 3.6 mEq/L (ref 3.5–5.1)

## 2011-11-20 LAB — GLUCOSE, CAPILLARY
Glucose-Capillary: 72 mg/dL (ref 70–99)
Glucose-Capillary: 81 mg/dL (ref 70–99)
Glucose-Capillary: 83 mg/dL (ref 70–99)
Glucose-Capillary: 86 mg/dL (ref 70–99)

## 2011-11-20 LAB — CULTURE, BLOOD (ROUTINE X 2): Culture: NO GROWTH

## 2011-11-20 NOTE — Progress Notes (Signed)
Name: [name] MRN: [id number] DOB: June 11, 1940    LOS: 6  Referring Provider:  Oletta Lamas Reason for Referral:  Acute on chronic respiratory failure  PULMONARY / CRITICAL CARE MEDICINE  HPI:  71 yo with a plethora of health problems - OHS/OSA, diastolic and systolic CHF, COPD, DM, HTN. Discharged 3 days prior to this admit from SNF. Presents with hypercarbic resp failure and required intubation 9/22. PCCM will admit to ICU  Events Since Admission: 9/22 >>> intubated 9/26 >>> extubated 9/27 bscopy - thick stringy secretions, opened up left lung  Subjective: Patient  resting in bed in no acute distress with non re breather. Has no complaints of pain. Wants to eat Objective: Vital Signs: Temp:  [96.7 F (35.9 C)-98.4 F (36.9 C)] 98.4 F (36.9 C) (09/28 0746) Pulse Rate:  [53-92] 55  (09/28 0600) Resp:  [14-33] 19  (09/28 0600) BP: (107-164)/(28-95) 148/73 mmHg (09/28 0600) SpO2:  [83 %-100 %] 100 % (09/28 0600) FiO2 (%):  [60 %] 60 % (09/28 0251) Weight:  [83 kg (182 lb 15.7 oz)] 83 kg (182 lb 15.7 oz) (09/28 0500)  Intake/Output Summary (Last 24 hours) at 11/20/11 0825 Last data filed at 11/20/11 0700  Gross per 24 hour  Intake    284 ml  Output    880 ml  Net   -596 ml    Physical Examination: General: NAD Neuro: interactive, non focal, no asterexis HEENT:  No LAN, cushingoid apperance Neck:  Thick neck, Insp and exp noise worse when talking or anxious Cardiovascular:  HSD Lungs:  Distant, referred UA noise Abdomen: obese +bs Musculoskeletal:  intact Skin:  warm  ASSESSMENT AND PLAN  PULMONARY  Lab 11/15/11 0320 11/14/11 1700 11/14/11 1341  PHART 7.505* 7.481* 7.147*  PCO2ART 41.0 48.6* 112.5*  PO2ART 90.4 223.0* 91.0  HCO3 32.1* 36.3* 38.9*  O2SAT 99.3 100.0 93.0   Ventilator Settings: Vent Mode:  [-] CPAP;PSV FiO2 (%):  [60 %] 60 % PEEP:  [5 cmH20-18 cmH20] 18 cmH20 Pressure Support:  [10 cmH20-24 cmH20] 24 cmH20 Plateau Pressure:  [20 cmH20]  20 cmH20 CXR:   Dg Chest Port 1 View  11/19/2011  *RADIOLOGY REPORT*  Clinical Data: Post bronch  PORTABLE CHEST - 1 VIEW  Comparison: 11/19/2011 at 1342 hours  Findings: Improved aeration of the left lung.  Mild patchy left lower lobe opacity, possibly a combination of atelectasis and pleural effusion.  Mild right basilar opacity, possibly atelectasis. No frank interstitial edema.  No pneumothorax.  The heart is top normal in size.  IMPRESSION: Improved aeration of the left lung.  Mild patchy left lower lobe opacity, possibly a combination of atelectasis and pleural effusion.  Mild right basilar opacity, possibly atelectasis.   Original Report Authenticated By: Charline Bills, M.D.    Dg Chest Port 1 View  11/19/2011  *RADIOLOGY REPORT*  Clinical Data: Left lung atelectasis.  PORTABLE CHEST - 1 VIEW  Comparison: 11/19/2011.  Findings: There is continued opacification in the left hemithorax. No definite aerated lung is seen on the left.  There is shift of heart and mediastinum to the left.  Nonaneurysmal aortic ectasia is present.  There is minimal atelectasis in the right lung base. There is a small amount of right pleural effusion. I cannot determine amount of left pleural effusion present.  IMPRESSION: Continued opacification of the left lung and left hemithorax with atelectasis. Minimal right basilar atelectasis. Small amount right pleural effusion.  I cannot determine amount of left pleural effusion present.  Original Report Authenticated By: Crawford Givens, M.D.    Dg Chest Port 1 View  11/19/2011  *RADIOLOGY REPORT*  Clinical Data: Extubation.  Follow up bibasilar atelectasis and/or pneumonia.  PORTABLE CHEST - 1 VIEW  Comparison: Portable chest x-rays 11/18/2011 dating back to 11/15/2011.  Findings: Interval extubation and nasogastric tube removal. Interval development of complete opacification of the left hemithorax, with mediastinal shift to the left.  Improved aeration at the right lung base, with  mild atelectasis persisting.  Right lung otherwise clear.  IMPRESSION: Interval development of near complete atelectasis of the left lung, presumably secondary to mucous plugging.  Improved aeration in the right lung base, with only mild atelectasis persisting.  These results will be called to the ordering clinician or representative by the Radiologist Assistant, and communication documented in the PACS Dashboard.   Original Report Authenticated By: Arnell Sieving, M.D.     ETT:  9/22>> 9/26  A:   - VDRF in setting of OSA ,noncompliant w CPAP, diastolic and systolic CHF - Extubated 9/26.  - 9/27 CXR shows near atelectasis of left lung, 2/2 mucus plugging  -improved post bscopy P:   - BD's - Continue demadex as patients can often respond differently to this than lasix - Additional lasix ordered 40mg  IV bid as long as S Cr remains stable - oob to chair to increase ventilation - incentive spirometry - chest PT via vest  - NPO until passes swallow eval  CARDIOVASCULAR  Lab 11/17/11 0449 11/16/11 0336 11/15/11 0409 11/14/11 2035 11/14/11 1514 11/14/11 1313  TROPONINI -- -- 0.44* 0.41* <0.30 --  LATICACIDVEN -- -- 0.7 -- -- --  PROBNP 22772.0* 36929.0* 39975.0* -- -- 45375.0*   ECG:   Lines:   A:  - CAD./CHF/Bifasicular block  -echo 7/13 ef 30%, gr 3 diast dysfn - troponin +, likely secondary to AE-CHF - elevated pro BNP P:  - Diuresis w both demadex and lasix as above. Will consult cardiology prior to discharge, - known to [facility] CHF clinic - Continue coreg, ntg paste - Hydralazine prn  RENAL  Lab 11/19/11 0349 11/18/11 0325 11/17/11 0449 11/16/11 0336 11/15/11 0409  NA 148* 145 145 144 141  K 4.0 3.1* -- -- --  CL 103 102 103 102 99  CO2 31 31 31  32 32  BUN 44* 37* 37* 37* 41*  CREATININE 2.28* 2.32* 2.35* 2.07* 2.32*  CALCIUM 9.2 9.1 8.9 8.9 9.1  MG -- 1.7 -- 1.4* 1.6  PHOS -- 3.4 -- 2.8 2.5   Intake/Output      09/27 0701 - 09/28 0700 09/28 0701 - 09/29 0700    I.V. (mL/kg) 290 (3.5)    NG/GT     IV Piggyback 8    Total Intake(mL/kg) 298 (3.6)    Urine (mL/kg/hr) 1180 (0.6)    Total Output 1180    Net -882          Foley:  9/22 >>   A:  - CRI - hypokalemia - resolved - mag, phos wnl - hypernatremia. Free water deficit of 2.41 L P:   - Renal consult prior to discharge, followed by Dr Eliott Nine   GASTROINTESTINAL  Lab 11/14/11 1313  AST 13  ALT 9  ALKPHOS 58  BILITOT 0.2*  PROT 7.0  ALBUMIN 3.4*    A:  - NPO - failed swallow eval P:   - rpt eval for MBS/FEES  HEMATOLOGIC  Lab 11/19/11 1610 11/18/11 0325 11/17/11 0449 11/16/11 0336 11/15/11 0409 11/14/11 1313  HGB 10.0* 9.4* 9.0* 9.3* 9.4* --  HCT 34.1* 30.3* 29.1* 30.4* 31.5* --  PLT 149* 117* 124* 123* 139* --  INR -- -- -- -- -- 1.04  APTT -- -- -- -- -- 28   A:   - Anemia - thrombocytopenia. ? Chronic  P:  - Monitor cbc  INFECTIOUS  Lab 11/19/11 0349 11/18/11 0325 11/17/11 0449 11/16/11 0336 11/15/11 0409  WBC 9.9 10.4 11.6* 9.8 9.6  PROCALCITON -- -- -- -- <0.10   Cultures: 9/22 sputum>> normal flora 9/22 Ucx>> candida 9/22 Bcx >> 1 of 2 bottles GPC >> micrococcus sp  Antibiotics: Vanco 9/23 >> 9/25  A:   - GPC on 1 of 2 cx, micrococcus. Likely contaminant - no current Abx P:   - Trend WBCs  ENDOCRINE  Lab 11/20/11 0720 11/20/11 0355 11/20/11 0005 11/19/11 1959 11/19/11 1623  GLUCAP 74 83 86 85 116*   A:   - DM - Glucose currently controlled P:   - SSI protocol  NEUROLOGIC  A:   - AMS due to hypercapnia >> resolved P:   - follow  BEST PRACTICE / DISPOSITION Level of Care:  ICU Primary Service:  pccm Consultants:   Code Status:  full Diet:  NPO, reassess after formal swallow eval DVT Px:  heparin GI Px:  ppi Skin Integrity:  intact Social / Family:  Updated at bedside  Cyril Mourning MD. FCCP. [facility] Pager [phone] If no response call [phone]

## 2011-11-20 NOTE — Progress Notes (Signed)
SLP Cancellation Note  ST received order for BSE. Evaluation to be deferred for 11/21/11. Moreen Fowler MS, CCC-SLP [phone] [facility] 11/20/2011, 5:39 PM

## 2011-11-20 NOTE — Evaluation (Signed)
Physical Therapy Evaluation Patient Details Name: [name] MRN: [id number] DOB: [birth year] Today's Date: 11/20/2011 Time: 4098-1191 PT Time Calculation (min): 16 min  PT Assessment / Plan / Recommendation Clinical Impression  pt presents with Resp Failure.  pt very resistant to mobility and states she will not attempt ambulating today, but agreeable to stand up.  pt's O2 sats decreased to 83% on 6L during standing.  Pending pt's progress she will need SNF at D/C prior to D/C home alone.      PT Assessment  Patient needs continued PT services    Follow Up Recommendations  Skilled nursing facility    Barriers to Discharge None      Equipment Recommendations  None recommended by PT    Recommendations for Other Services OT consult   Frequency Min 3X/week    Precautions / Restrictions Precautions Precautions: Fall Precaution Comments: O2 sats.   Restrictions Weight Bearing Restrictions: No   Pertinent Vitals/Pain Pt indicates her neck is stiff.        Mobility  Bed Mobility Bed Mobility: Not assessed Transfers Transfers: Sit to Stand;Stand to Sit Sit to Stand: 3: Mod assist;With upper extremity assist;From chair/3-in-1 Stand to Sit: 4: Min assist;With upper extremity assist;To chair/3-in-1 Details for Transfer Assistance: pt uses armrests of chair and needs A for anterior wt shifting and to control descent.   Ambulation/Gait Ambulation/Gait Assistance: Not tested (comment) (pt refused.  ) Stairs: No Wheelchair Mobility Wheelchair Mobility: No    Shoulder Instructions     Exercises     PT Diagnosis: Difficulty walking;Generalized weakness  PT Problem List: Decreased strength;Decreased activity tolerance;Decreased balance;Decreased mobility;Decreased knowledge of use of DME;Cardiopulmonary status limiting activity PT Treatment Interventions: DME instruction;Stair training;Gait training;Functional mobility training;Therapeutic activities;Therapeutic  exercise;Balance training;Patient/family education   PT Goals Acute Rehab PT Goals PT Goal Formulation: With patient Time For Goal Achievement: 12/04/11 Potential to Achieve Goals: Good Pt will go Supine/Side to Sit: with modified independence PT Goal: Supine/Side to Sit - Progress: Goal set today Pt will go Sit to Supine/Side: with modified independence PT Goal: Sit to Supine/Side - Progress: Goal set today Pt will go Sit to Stand: with modified independence PT Goal: Sit to Stand - Progress: Goal set today Pt will go Stand to Sit: with modified independence PT Goal: Stand to Sit - Progress: Goal set today Pt will Ambulate: >150 feet;with modified independence;with rolling walker PT Goal: Ambulate - Progress: Goal set today  Visit Information  Last PT Received On: 11/20/11 Assistance Needed: +1    Subjective Data  Subjective: I ain't walking.   Patient Stated Goal: Home   Prior Functioning  Home Living Lives With: Alone Available Help at Discharge: Family (Daughter stops by afterwork to check on pt daily.  ) Type of Home: House Home Access: Stairs to enter Secretary/administrator of Steps: 4 Entrance Stairs-Rails: Right;Left Home Layout: One level Home Adaptive Equipment: Straight cane;Walker - rolling Prior Function Level of Independence: Needs assistance Needs Assistance: Gait Gait Assistance: with RW Able to Take Stairs?: Yes Driving: No Vocation: Retired Comments: pt's daughter drives, get groceries, and takes care of pt's bills.   Communication Communication: No difficulties    Cognition  Overall Cognitive Status: Appears within functional limits for tasks assessed/performed Arousal/Alertness: Awake/alert Orientation Level: Appears intact for tasks assessed Behavior During Session: [facility] for tasks performed    Extremity/Trunk Assessment Right Lower Extremity Assessment RLE ROM/Strength/Tone: Deficits RLE ROM/Strength/Tone Deficits: Generally weak.   RLE  Sensation: WFL - Light Touch Left Lower  Extremity Assessment LLE ROM/Strength/Tone: Deficits LLE ROM/Strength/Tone Deficits: Generally weak.   LLE Sensation: WFL - Light Touch Trunk Assessment Trunk Assessment: Normal   Balance Balance Balance Assessed: Yes Static Standing Balance Static Standing - Balance Support: Bilateral upper extremity supported Static Standing - Level of Assistance: 4: Min assist Static Standing - Comment/# of Minutes: pt stood ~31mins with MinA at edge of chair.    End of Session PT - End of Session Equipment Utilized During Treatment: Oxygen Activity Tolerance: Patient limited by fatigue Patient left: in chair;with call bell/phone within reach Nurse Communication: Mobility status  GP     Sunny Schlein, [place] [phone] 11/20/2011, 12:25 PM

## 2011-11-21 DIAGNOSIS — N184 Chronic kidney disease, stage 4 (severe): Secondary | ICD-10-CM

## 2011-11-21 DIAGNOSIS — I279 Pulmonary heart disease, unspecified: Secondary | ICD-10-CM

## 2011-11-21 LAB — BASIC METABOLIC PANEL
CO2: 33 mEq/L — ABNORMAL HIGH (ref 19–32)
Calcium: 9.1 mg/dL (ref 8.4–10.5)
Creatinine, Ser: 2.56 mg/dL — ABNORMAL HIGH (ref 0.50–1.10)
GFR calc non Af Amer: 18 mL/min — ABNORMAL LOW (ref >90)
Sodium: 150 mEq/L — ABNORMAL HIGH (ref 135–145)

## 2011-11-21 LAB — CULTURE, RESPIRATORY W GRAM STAIN

## 2011-11-21 LAB — CBC
MCH: 25.1 pg — ABNORMAL LOW (ref 26.0–34.0)
MCV: 88.1 fL (ref 78.0–100.0)
Platelets: 169 10*3/uL (ref 150–400)
RBC: 3.78 MIL/uL — ABNORMAL LOW (ref 3.87–5.11)
RDW: 17.6 % — ABNORMAL HIGH (ref 11.5–15.5)
WBC: 6.6 10*3/uL (ref 4.0–10.5)

## 2011-11-21 LAB — GLUCOSE, CAPILLARY
Glucose-Capillary: 78 mg/dL (ref 70–99)
Glucose-Capillary: 83 mg/dL (ref 70–99)
Glucose-Capillary: 83 mg/dL (ref 70–99)

## 2011-11-21 NOTE — Evaluation (Signed)
Clinical/Bedside Swallow Evaluation Patient Details  Name: [name] MRN: [id number] Date of Birth: 1940/10/01  Today's Date: 11/21/2011 Time: 1230-1300 SLP Time Calculation (min): 30 min  Past Medical History:  Past Medical History  Diagnosis Date  . COPD (chronic obstructive pulmonary disease)     on home o2  . Chronic renal insufficiency   . Type II or unspecified type diabetes mellitus without mention of complication, not stated as uncontrolled   . CVA (cerebral infarction)     hx  . Hypertensive heart disease with congestive heart failure   . Chronic cor pulmonale   . Cardiomyopathy of undetermined type 09/07/2011  . Chronic kidney disease (CKD), stage IV (severe)   . Hyperlipdemia   . Obesity (BMI 30-39.9)   . Sleep apnea   . History of gout 09/07/2011  . Heart failure 7/13  . Shortness of breath   . Anemia   . Vascular disease   . Asthma    Past Surgical History:  Past Surgical History  Procedure Date  . Bilateral oophorectomy   . Cardiac catheterization     right heart cath   HPI:  71 y/o female admitted to ED with hypercarbic resp. failure and requried intubation 9/22 to 9/26.  9/27 bscopy. CXR: 11/20/11 indicates left lower lobe atelectasis and or consolidation with small left-sided pleural effusion and mild elevation of left hemidiaphragm.  Patient referred for BSE to assess risk for aspiration.   Assessment / Plan / Recommendation Clinical Impression  Minimal dysphagia indicated characterized by decreased sensory and weakness. Baseline wet productive cough.  Slight delay in initiation of the swallow noted. Hyoid laryngeal elevation functional per palpation initial swallows but decreased slightly with swallows in succession due to fatigue. No outward s/s of aspiration noted throughout evaluation with puree and thin liquid trials.  Recommend to initiate conservative diet of dysphagia 1 (puree) due to noted decreased reserve and thin liquid by cup sips only  with full supervision with all meals to cue patient to comply with aspiration precautions.  ST to follow closely in acute care setting for diet tolerance as aspiration risk remains moderate due to respiratory compromise.    Aspiration Risk  Moderate    Diet Recommendation Dysphagia 1 (Puree);Thin liquid   Liquid Administration via: Cup;No straw Medication Administration: Crushed with puree Supervision: Patient able to self feed;Full supervision/cueing for compensatory strategies Compensations: Slow rate;Small sips/bites;Clear throat intermittently;Multiple dry swallows after each bite/sip Postural Changes and/or Swallow Maneuvers: Out of bed for meals;Upright 30-60 min after meal    Other  Recommendations Oral Care Recommendations: Oral care before and after PO Other Recommendations: Clarify dietary restrictions   Follow Up Recommendations  Skilled Nursing facility    Frequency and Duration min 2x/week  2 weeks       SLP Swallow Goals Patient will consume recommended diet without observed clinical signs of aspiration with: Minimal assistance Patient will utilize recommended strategies during swallow to increase swallowing safety with: Minimal assistance   Swallow Study Prior Functional Status   Patient denies prior reports of dysphagia    General Date of Onset: 11/14/11 HPI: 71 y/o female admitted to ED with hypercarbic resp. failure and requried intubation 9/22 to 9/26.  9/27 bscopy. CXR: 11/20/11 indicates left lower lobe atelectasis and or consolidation with small left-sided pleural effusion and mild elevation of left hemidiaphragm.  Patient referred for BSE to assess risk for aspiration. Type of Study: Bedside swallow evaluation Previous Swallow Assessment: No prior reports in EPIC.  Patient denies  prior reports of dysphagia  Diet Prior to this Study: NPO Temperature Spikes Noted: No Respiratory Status: Supplemental O2 delivered via (comment) History of Recent Intubation:  Yes Length of Intubations (days): 4 days Date extubated: 11/18/11 Behavior/Cognition: Alert;Cooperative;Pleasant mood Oral Cavity - Dentition: Edentulous Self-Feeding Abilities: Able to feed self Patient Positioning: Upright in chair Baseline Vocal Quality: Clear Volitional Cough: Strong;Wet Volitional Swallow: Able to elicit    Oral/Motor/Sensory Function Overall Oral Motor/Sensory Function: Appears within functional limits for tasks assessed   Ice Chips Ice chips: Within functional limits Presentation: Self Fed   Thin Liquid Thin Liquid: Within functional limits Presentation: Cup    Nectar Thick Nectar Thick Liquid: Not tested   Honey Thick Honey Thick Liquid: Not tested Presentation: Self fed   Puree Puree: Within functional limits Presentation: Spoon   Solid   GO  Moreen Fowler MS, CCC-SLP [phone] Solid: Not tested       [facility] 11/21/2011,2:15 PM

## 2011-11-21 NOTE — Progress Notes (Signed)
Name: [name] MRN: [id number] DOB: 11-21-1940    LOS: 7  Referring Provider:  Oletta Lamas Reason for Referral:  Acute on chronic respiratory failure  PULMONARY / CRITICAL CARE MEDICINE  HPI:  71 yo with a plethora of health problems - OHS/OSA, diastolic and systolic CHF, COPD, DM, HTN. Discharged 3 days prior to this admit from SNF. Presents with hypercarbic resp failure and required intubation 9/22. PCCM will admit to ICU  Events Since Admission: 9/22 >>> intubated 9/26 >>> extubated 9/27 bscopy - thick stringy secretions, opened up left lung  Subjective: Patient  resting in chair in no acute distress on [place]. Has no complaints of pain. Wants to eat   Objective: Vital Signs: Temp:  [97.4 F (36.3 C)-98.4 F (36.9 C)] 97.9 F (36.6 C) (09/29 0759) Pulse Rate:  [62-86] 64  (09/29 0700) Resp:  [16-26] 22  (09/29 0700) BP: (104-152)/(45-82) 151/60 mmHg (09/29 0700) SpO2:  [69 %-100 %] 92 % (09/29 0844) FiO2 (%):  [50 %-60 %] 60 % (09/29 0639) Weight:  [82.9 kg (182 lb 12.2 oz)] 82.9 kg (182 lb 12.2 oz) (09/29 0437)  Intake/Output Summary (Last 24 hours) at 11/21/11 0937 Last data filed at 11/21/11 0400  Gross per 24 hour  Intake    240 ml  Output   1100 ml  Net   -860 ml    Physical Examination: General: NAD Neuro: interactive, non focal, no asterexis HEENT:  No LAN, cushingoid apperance Neck:  No jvd, no stridor Cardiovascular:  HSD Lungs: decreased lt base, no rhonchi Abdomen: obese +bs Musculoskeletal:  intact Skin:  warm  ASSESSMENT AND PLAN  PULMONARY  Lab 11/15/11 0320 11/14/11 1700 11/14/11 1341  PHART 7.505* 7.481* 7.147*  PCO2ART 41.0 48.6* 112.5*  PO2ART 90.4 223.0* 91.0  HCO3 32.1* 36.3* 38.9*  O2SAT 99.3 100.0 93.0   Ventilator Settings: Vent Mode:  [-]  FiO2 (%):  [50 %-60 %] 60 % CXR:   Dg Chest Port 1 View  11/20/2011  *RADIOLOGY REPORT*  Clinical Data: Evaluate left lung collapse.  PORTABLE CHEST - 1 VIEW  Comparison: Chest x-ray  11/19/2011.  Findings: There appears to be some elevation of the left hemidiaphragm (unchanged).  The dense retrocardiac opacification on the left compatible with left lower lobe atelectasis and/or consolidation.  Probable small left-sided pleural effusion is unchanged.  Right lung appears relatively clear.  No right pleural effusion.  Pulmonary vasculature is normal.  Heart size is upper limits of normal. The patient is rotated to the left on today's exam, resulting in distortion of the mediastinal contours and reduced diagnostic sensitivity and specificity for mediastinal pathology.  Atherosclerosis in the thoracic aorta.  IMPRESSION: 1.  Left lower lobe atelectasis and/or consolidation with small left-sided pleural effusion and mild elevation of the left hemidiaphragm. 2.  Atherosclerosis.   Original Report Authenticated By: Florencia Reasons, M.D.    Dg Chest Port 1 View  11/19/2011  *RADIOLOGY REPORT*  Clinical Data: Post bronch  PORTABLE CHEST - 1 VIEW  Comparison: 11/19/2011 at 1342 hours  Findings: Improved aeration of the left lung.  Mild patchy left lower lobe opacity, possibly a combination of atelectasis and pleural effusion.  Mild right basilar opacity, possibly atelectasis. No frank interstitial edema.  No pneumothorax.  The heart is top normal in size.  IMPRESSION: Improved aeration of the left lung.  Mild patchy left lower lobe opacity, possibly a combination of atelectasis and pleural effusion.  Mild right basilar opacity, possibly atelectasis.   Original Report  Authenticated By: Charline Bills, M.D.    Dg Chest Port 1 View  11/19/2011  *RADIOLOGY REPORT*  Clinical Data: Left lung atelectasis.  PORTABLE CHEST - 1 VIEW  Comparison: 11/19/2011.  Findings: There is continued opacification in the left hemithorax. No definite aerated lung is seen on the left.  There is shift of heart and mediastinum to the left.  Nonaneurysmal aortic ectasia is present.  There is minimal atelectasis in the right  lung base. There is a small amount of right pleural effusion. I cannot determine amount of left pleural effusion present.  IMPRESSION: Continued opacification of the left lung and left hemithorax with atelectasis. Minimal right basilar atelectasis. Small amount right pleural effusion.  I cannot determine amount of left pleural effusion present.   Original Report Authenticated By: Crawford Givens, M.D.     ETT:  9/22>> 9/26  A:   - VDRF in setting of OSA ,noncompliant w CPAP, diastolic and systolic CHF - Extubated 9/26.  - 9/27 CXR shows near atelectasis of left lung, 2/2 mucus plugging  -improved post bscopy   P:   - BD's - oob to chair to increase ventilation - incentive spirometry - chest PT via vest  - NPO until passes swallow eval  CARDIOVASCULAR  Lab 11/17/11 0449 11/16/11 0336 11/15/11 0409 11/14/11 2035 11/14/11 1514 11/14/11 1313  TROPONINI -- -- 0.44* 0.41* <0.30 --  LATICACIDVEN -- -- 0.7 -- -- --  PROBNP 22772.0* 36929.0* 39975.0* -- -- 45375.0*   ECG:   Lines:   A:  - CAD./CHF/Bifasicular block  -echo 7/13 ef 30%, gr 3 diast dysfn - troponin +, likely secondary to AE-CHF - elevated pro BNP P:  - - Continue demadex  - known to [facility] - Continue coreg, ntg paste - Hydralazine prn  RENAL  Lab 11/21/11 0440 11/20/11 0953 11/19/11 0349 11/18/11 0325 11/17/11 0449 11/16/11 0336 11/15/11 0409  NA 150* 149* 148* 145 145 -- --  K 3.7 3.6 -- -- -- -- --  CL 106 104 103 102 103 -- --  CO2 33* 34* 31 31 31  -- --  BUN 59* 57* 44* 37* 37* -- --  CREATININE 2.56* 2.46* 2.28* 2.32* 2.35* -- --  CALCIUM 9.1 9.2 9.2 9.1 8.9 -- --  MG -- -- -- 1.7 -- 1.4* 1.6  PHOS -- -- -- 3.4 -- 2.8 2.5   Intake/Output      09/28 0701 - 09/29 0700 09/29 0701 - 09/30 0700   I.V. (mL/kg) 250 (3)    IV Piggyback 4    Total Intake(mL/kg) 254 (3.1)    Urine (mL/kg/hr) 1300 (0.7)    Total Output 1300    Net -1046          Foley:  9/22 >>   A:  - CRI - hypokalemia -  resolved - mag, phos wnl - hypernatremia. Free water deficit of 2.41 L P:   -  followed by Dr Eliott Nine -dc lasix -rising cr -ct demadex when able to take PO   GASTROINTESTINAL  Lab 11/14/11 1313  AST 13  ALT 9  ALKPHOS 58  BILITOT 0.2*  PROT 7.0  ALBUMIN 3.4*    A:  - NPO - failed swallow eval P:   - rpt eval for MBS/FEES  HEMATOLOGIC  Lab 11/21/11 0440 11/20/11 0953 11/19/11 0349 11/18/11 0325 11/17/11 0449 11/14/11 1313  HGB 9.5* 9.7* 10.0* 9.4* 9.0* --  HCT 33.3* 34.0* 34.1* 30.3* 29.1* --  PLT 169 197 149* 117* 124* --  INR -- -- -- -- -- 1.04  APTT -- -- -- -- -- 28   A:   - Anemia - thrombocytopenia. ? Chronic  P:  - Monitor cbc  INFECTIOUS  Lab 11/21/11 0440 11/20/11 0953 11/19/11 0349 11/18/11 0325 11/17/11 0449 11/15/11 0409  WBC 6.6 8.8 9.9 10.4 11.6* --  PROCALCITON -- -- -- -- -- <0.10   Cultures: 9/22 sputum>> normal flora 9/22 Ucx>> candida 9/22 Bcx >> 1 of 2 bottles GPC >> micrococcus sp  Antibiotics: Vanco 9/23 >> 9/25  A:   - GPC on 1 of 2 cx, micrococcus. Likely contaminant - no current Abx P:   - Trend WBCs  ENDOCRINE  Lab 11/21/11 0731 11/21/11 0355 11/21/11 0005 11/20/11 1943 11/20/11 1524  GLUCAP 76 78 83 79 81   A:   - DM - Glucose currently controlled P:   - SSI protocol  NEUROLOGIC  A:   - AMS due to hypercapnia >> resolved P:   - follow  BEST PRACTICE / DISPOSITION Level of Care:  ICU- tr to sdu Primary Service:  pccm Consultants:   Code Status:  full Diet:  NPO, reassess after formal swallow eval DVT Px:  heparin GI Px:  ppi Skin Integrity:  intact Social / Family:  Updated at bedside  Cyril Mourning MD. FCCP. [facility] Pager [phone] If no response call [phone]

## 2011-11-22 ENCOUNTER — Inpatient Hospital Stay (HOSPITAL_COMMUNITY): Payer: Medicare Other

## 2011-11-22 LAB — CBC
HCT: 34 % — ABNORMAL LOW (ref 36.0–46.0)
Hemoglobin: 9.7 g/dL — ABNORMAL LOW (ref 12.0–15.0)
MCHC: 28.5 g/dL — ABNORMAL LOW (ref 30.0–36.0)
RBC: 3.81 MIL/uL — ABNORMAL LOW (ref 3.87–5.11)

## 2011-11-22 LAB — GLUCOSE, CAPILLARY
Glucose-Capillary: 117 mg/dL — ABNORMAL HIGH (ref 70–99)
Glucose-Capillary: 118 mg/dL — ABNORMAL HIGH (ref 70–99)
Glucose-Capillary: 129 mg/dL — ABNORMAL HIGH (ref 70–99)
Glucose-Capillary: 94 mg/dL (ref 70–99)

## 2011-11-22 LAB — POCT I-STAT 3, ART BLOOD GAS (G3+)
Bicarbonate: 38.5 mEq/L — ABNORMAL HIGH (ref 20.0–24.0)
Patient temperature: 98.6
pH, Arterial: 7.455 — ABNORMAL HIGH (ref 7.350–7.450)

## 2011-11-22 LAB — PRO B NATRIURETIC PEPTIDE: Pro B Natriuretic peptide (BNP): 16044 pg/mL — ABNORMAL HIGH (ref 0–125)

## 2011-11-22 LAB — BASIC METABOLIC PANEL
BUN: 58 mg/dL — ABNORMAL HIGH (ref 6–23)
Chloride: 103 mEq/L (ref 96–112)
GFR calc Af Amer: 23 mL/min — ABNORMAL LOW (ref >90)
Glucose, Bld: 122 mg/dL — ABNORMAL HIGH (ref 70–99)
Potassium: 3.6 mEq/L (ref 3.5–5.1)

## 2011-11-22 MED ORDER — VECURONIUM BROMIDE 10 MG IV SOLR
10.0000 mg | Freq: Once | INTRAVENOUS | Status: DC
  Filled 2011-11-22: qty 10

## 2011-11-22 MED ORDER — MIDAZOLAM HCL 2 MG/2ML IJ SOLN
1.0000 mg | INTRAMUSCULAR | Status: DC | PRN
  Administered 2011-11-22 (×3): 2 mg via INTRAVENOUS
  Filled 2011-11-22 (×2): qty 2

## 2011-11-22 MED ORDER — FENTANYL CITRATE 0.05 MG/ML IJ SOLN
25.0000 ug | INTRAMUSCULAR | Status: DC | PRN
  Administered 2011-11-22: 50 ug via INTRAVENOUS
  Filled 2011-11-22: qty 2

## 2011-11-22 MED ORDER — MIDAZOLAM HCL 2 MG/2ML IJ SOLN
INTRAMUSCULAR | Status: AC
  Administered 2011-11-22: 2 mg via INTRAVENOUS
  Filled 2011-11-22: qty 2

## 2011-11-22 MED ORDER — IPRATROPIUM-ALBUTEROL 18-103 MCG/ACT IN AERO
6.0000 | INHALATION_SPRAY | Freq: Four times a day (QID) | RESPIRATORY_TRACT | Status: DC
  Administered 2011-11-22 – 2011-11-24 (×7): 6 via RESPIRATORY_TRACT
  Filled 2011-11-22: qty 14.7

## 2011-11-22 MED ORDER — FENTANYL CITRATE 0.05 MG/ML IJ SOLN: 200.0000 ug | Freq: Once | INTRAMUSCULAR | Status: AC

## 2011-11-22 MED ORDER — FENTANYL BOLUS VIA INFUSION
50.0000 ug | Freq: Four times a day (QID) | INTRAVENOUS | Status: DC | PRN
  Administered 2011-11-23: 100 ug via INTRAVENOUS
  Administered 2011-11-23: 50 ug via INTRAVENOUS
  Administered 2011-11-23: 100 ug via INTRAVENOUS
  Filled 2011-11-22: qty 100

## 2011-11-22 MED ORDER — MIDAZOLAM BOLUS VIA INFUSION
1.0000 mg | INTRAVENOUS | Status: DC | PRN
  Administered 2011-11-23 (×3): 2 mg via INTRAVENOUS
  Filled 2011-11-22: qty 2

## 2011-11-22 MED ORDER — SODIUM CHLORIDE 0.9 % IV SOLN
2.0000 mg/h | INTRAVENOUS | Status: DC
  Administered 2011-11-22: 2 mg/h via INTRAVENOUS
  Filled 2011-11-22 (×3): qty 10

## 2011-11-22 MED ORDER — DEXAMETHASONE SODIUM PHOSPHATE 10 MG/ML IJ SOLN
10.0000 mg | Freq: Four times a day (QID) | INTRAMUSCULAR | Status: AC
  Administered 2011-11-22 – 2011-11-24 (×6): 10 mg via INTRAVENOUS
  Filled 2011-11-22 (×6): qty 1

## 2011-11-22 MED ORDER — MIDAZOLAM HCL 2 MG/2ML IJ SOLN
INTRAMUSCULAR | Status: AC
  Filled 2011-11-22: qty 2

## 2011-11-22 MED ORDER — PROPOFOL 10 MG/ML IV EMUL: 5.0000 ug/kg/min | Freq: Once | INTRAVENOUS | Status: DC

## 2011-11-22 MED ORDER — BUTAMBEN-TETRACAINE-BENZOCAINE 2-2-14 % EX AERO
2.0000 | INHALATION_SPRAY | Freq: Once | CUTANEOUS | Status: AC
  Administered 2011-11-22: 2 via TOPICAL

## 2011-11-22 MED ORDER — ETOMIDATE 2 MG/ML IV SOLN: 15.0000 mg | Freq: Once | INTRAVENOUS | Status: DC

## 2011-11-22 MED ORDER — SODIUM CHLORIDE 0.9 % IV SOLN
50.0000 ug/h | INTRAVENOUS | Status: DC
  Administered 2011-11-22: 50 ug/h via INTRAVENOUS
  Filled 2011-11-22 (×2): qty 50

## 2011-11-22 MED ORDER — ETOMIDATE 2 MG/ML IV SOLN
15.0000 mg | Freq: Once | INTRAVENOUS | Status: AC
  Administered 2011-11-22: 15 mg via INTRAVENOUS

## 2011-11-22 MED ORDER — MIDAZOLAM HCL 2 MG/2ML IJ SOLN: 5.0000 mg | Freq: Once | INTRAMUSCULAR | Status: AC

## 2011-11-22 MED ORDER — ETOMIDATE 2 MG/ML IV SOLN
INTRAVENOUS | Status: AC
  Filled 2011-11-22: qty 10

## 2011-11-22 MED ORDER — ETOMIDATE 2 MG/ML IV SOLN
40.0000 mg | Freq: Once | INTRAVENOUS | Status: DC
  Filled 2011-11-22: qty 20

## 2011-11-22 NOTE — Procedures (Signed)
Bronchoscopy Procedure Note [name] [id number] 1941-01-02  Procedure: Bronchoscopy Indications: Remove secretions  Procedure Details Consent: Risks of procedure as well as the alternatives and risks of each were explained to the (patient/caregiver).  Consent for procedure obtained. Time Out: Verified patient identification, verified procedure, site/side was marked, verified correct patient position, special equipment/implants available, medications/allergies/relevent history reviewed, required imaging and test results available.  Performed  In preparation for procedure, patient was given 100% FiO2 and bronchoscope lubricated. Sedation: Benzodiazepines  Airway entered and the following bronchi were examined: LUL, LLL and Bronchi.   Procedures performed: Brushings performed Bronchoscope removed.  , Patient placed back on 100% FiO2 at conclusion of procedure.    Evaluation Hemodynamic Status: BP stable throughout; O2 sats: stable throughout Patient's Current Condition: stable Specimens:  Sent purulent fluid none Complications: No apparent complications Patient did tolerate procedure well.   Nelda Bucks. 11/22/2011  Through mouth, large scope. 1. A lot of secretions over airway 2. Small opening on posterior aspect of cords only, difficult to access, eventually able to pass safely 3. Complete white thick pus obstructing left main , thick, all removed, lavaged entire left lobes, cleared 4. Some tracheal narrowing as well , mild   Tolerated well. sats 97% Awake  Mcarthur Rossetti. Tyson Alias, MD, FACP Pgr: [phone] [facility]

## 2011-11-22 NOTE — Progress Notes (Signed)
OT Cancellation Note  Treatment cancelled today due to patient receiving procedure or test--pt to have a bronch at 3:30 and nurse does not want the pt worn out. Will attempt eval tomorrow.  [name] [phone] 11/22/2011, 1:46 PM

## 2011-11-22 NOTE — Procedures (Signed)
Bronchoscopy Procedure Note [name] [id number] 04/14/1940  Procedure: Bronchoscopy Indications: Diagnostic evaluation of the airways  Procedure Details Consent: Unable to obtain consent because of emergent medical necessity. Time Out: Verified patient identification, verified procedure, site/side was marked, verified correct patient position, special equipment/implants available, medications/allergies/relevent history reviewed, required imaging and test results available.  Performed  In preparation for procedure, patient was given 100% FiO2 and bronchoscope lubricated. Sedation: Benzodiazepines  Airway entered and the following bronchi were examined: Bronchi.   Procedures performed:ett placement Bronchoscope removed.    Evaluation Hemodynamic Status: BP stable throughout; O2 sats: stable throughout Patient's Current Condition: stable Specimens:  None Complications: No apparent complications Patient did tolerate procedure well.   Nelda Bucks. 11/22/2011   Placed 7 ett over bronch. Noted cords, placed bronch into airway successful. Then slid ett over . Placed at 21 lip. Removed scope. Tolerated well.  .sigfdf

## 2011-11-22 NOTE — Progress Notes (Signed)
Bronch noted. Decided for ETT.  Tolerated well Attempted to call family to discuss. Developed mild strridor in setting likely inability to control secretions. For safety, ett placed Add prvc. Plan possible trach Add int sedation Post cxr and abg  Ccm time 30 min   Mcarthur Rossetti. Tyson Alias, MD, FACP Pgr: [phone] [facility]

## 2011-11-22 NOTE — Progress Notes (Addendum)
Patient refusing Bipap at this time. States that it is too much pressure and wants to stay on the current mask. Also, patient refusing CPT at this time. She states she is nauseous and would like to wait until morning. No distress noted. RT will continue to monitor.

## 2011-11-22 NOTE — Procedures (Signed)
Intubation Procedure Note [name] [id number] 06-25-1940  Procedure: Intubation Indications: Respiratory insufficiency  Procedure Details Consent: Unable to obtain consent because of emergent medical necessity. Time Out: Verified patient identification, verified procedure, site/side was marked, verified correct patient position, special equipment/implants available, medications/allergies/relevent history reviewed, required imaging and test results available.  Performed  Maximum sterile technique was used including gown, hand hygiene and mask.  3    Evaluation Hemodynamic Status: BP stable throughout; O2 sats: stable throughout Patient's Current Condition: stable Complications: No apparent complications Patient did tolerate procedure well. Chest X-ray ordered to verify placement.  CXR: pending.   FEINSTEIN,DANIEL J. 11/22/2011  30 min post bronch, noted some mild stridor. To note she had a small orofice at cords. Likely will be able to clear secretions with these findings. Attempted to call daughter to discuss ETT and trach? Unable to speak to her. With mild stridor and known bronch findings. Elective intubation performed over bronch. Tolerated well   After placement. Replaced bronch and confirmed placement Mcarthur Rossetti. Tyson Alias, MD, FACP Pgr: [phone] [facility]

## 2011-11-22 NOTE — Progress Notes (Signed)
Physical Therapy Treatment Patient Details Name: [name] MRN: [id number] DOB: [birth year] Today's Date: 11/22/2011 Time: 0811-0827 PT Time Calculation (min): 16 min  PT Assessment / Plan / Recommendation Comments on Treatment Session  pt presents with Respiratory Failure.  pt needs Max encouragement for OOB.  pt's O2 sats remained in 90's throughout mobility with pt on 15L O2 at 50%.      Follow Up Recommendations   (SNF vs Lt[facility] pending respiratory progress.  )    Barriers to Discharge        Equipment Recommendations  None recommended by PT    Recommendations for Other Services OT consult  Frequency Min 3X/week   Plan Discharge plan remains appropriate;Frequency remains appropriate    Precautions / Restrictions Precautions Precautions: Fall Precaution Comments: O2 sats.   Restrictions Weight Bearing Restrictions: No   Pertinent Vitals/Pain Denies pain.  O2 sats in 90's throughout session while on Venti mask 15L O2 at 50%.      Mobility  Bed Mobility Bed Mobility: Supine to Sit;Sitting - Scoot to Edge of Bed Supine to Sit: 4: Min assist Sitting - Scoot to [place] of Bed: 4: Min guard Details for Bed Mobility Assistance: cues for pt to slow down and let PT attend to lines prior to sitting.   Transfers Transfers: Sit to Stand;Stand to [employer] Transfers Sit to Stand: 4: Min assist;With upper extremity assist;From bed Stand to Sit: 4: Min assist;With upper extremity assist;To chair/3-in-1;With armrests Stand Pivot Transfers: 4: Min assist Details for Transfer Assistance: pt needs encouragement for OOB.  cues for safe technique, use of UEs.   Ambulation/Gait Ambulation/Gait Assistance: Not tested (comment) Stairs: No Wheelchair Mobility Wheelchair Mobility: No    Exercises     PT Diagnosis:    PT Problem List:   PT Treatment Interventions:     PT Goals Acute Rehab PT Goals Time For Goal Achievement: 12/04/11 PT Goal: Supine/Side to Sit - Progress:  Progressing toward goal PT Goal: Sit to Stand - Progress: Progressing toward goal PT Goal: Stand to Sit - Progress: Progressing toward goal  Visit Information  Last PT Received On: 11/22/11 Assistance Needed: +1    Subjective Data  Subjective: I'm not doing anything.     Cognition  Overall Cognitive Status: Appears within functional limits for tasks assessed/performed Arousal/Alertness: Awake/alert Orientation Level: Appears intact for tasks assessed Behavior During Session: [facility] for tasks performed    Balance  Balance Balance Assessed: No  End of Session PT - End of Session Equipment Utilized During Treatment: Oxygen Activity Tolerance: Patient limited by fatigue Patient left: in chair;with call bell/phone within reach Nurse Communication: Mobility status   GP     Sunny Schlein, [place] [phone] 11/22/2011, 10:56 AM

## 2011-11-22 NOTE — Progress Notes (Signed)
Video Bronchoscopy performed at bedside.

## 2011-11-22 NOTE — Progress Notes (Signed)
SLP Cancellation Note MBS scheduled  this date cancelled due to medical issues prohibiting evaluation.  ST to follow on 11/22/11 for POC.  Moreen Fowler M.S., CCC-SLP [phone]  [facility] 11/22/2011, 1:16 PM

## 2011-11-22 NOTE — Progress Notes (Signed)
tolerated

## 2011-11-22 NOTE — Progress Notes (Signed)
Speech Language Pathology Dysphagia Treatment Patient Details Name: [name] MRN: [id number] DOB: 1940-07-27 Today's Date: 11/22/2011 Time: 1030-1045 SLP Time Calculation (min): 15 min  Assessment / Plan / Recommendation Clinical Impression  Purpose of diagnostic treatment to follow up for diet tolerance of dysphagia 1 (puree) and thin liquids from initial BSE completed 11/21/11.  Patient observed with thin liquid by cup with moderate verbal cues to take small, safe sips.  Wet vocal quality noted s/p swallow .  Due to s/s symptoms present and current respiratory status recommend to proceed with objective assessment of MBS to assess risk for aspiration and recommend safest, PO diet.      Diet Recommendation  Continue with Current Diet: Dysphagia 1 (puree);Thin liquid    SLP Plan MBS;New goals to be determined pending objective testing      Swallowing Goals  SLP Swallowing Goals Swallow Study Goal #1 - Progress: Progressing toward goal Swallow Study Goal #2 - Progress: Progressing toward goal  General Temperature Spikes Noted: No Respiratory Status: Other (comment) (nasal cannula) Behavior/Cognition: Lethargic;Cooperative;Requires cueing Oral Cavity - Dentition: Edentulous Patient Positioning: Upright in chair  Oral Cavity - Oral Hygiene Does patient have any of the following "at risk" factors?: Other - dysphagia;Oxygen therapy - cannula, mask, simple oxygen devices   Dysphagia Treatment Treatment focused on: Skilled observation of diet tolerance;Facilitation of pharyngeal phase Treatment Methods/Modalities: Skilled observation Patient observed directly with PO's: Yes Type of PO's observed: Thin liquids Liquids provided via: Cup Pharyngeal Phase Signs & Symptoms: Suspected delayed swallow initiation;Wet vocal quality Type of cueing: Verbal Amount of cueing: Moderate   GO   Moreen Fowler MS, CCC-SLP [phone]  [facility] 11/22/2011, 1:12 PM

## 2011-11-22 NOTE — Progress Notes (Signed)
Name: [name] MRN: [id number] DOB: October 03, 1940    LOS: 8  Referring Provider:  Oletta Lamas Reason for Referral:  Acute on chronic respiratory failure  PULMONARY / CRITICAL CARE MEDICINE  HPI:  71 yo with a plethora of health problems - OHS/OSA, diastolic and systolic CHF, COPD, DM, HTN. Discharged 3 days prior to this admit from SNF. Presents with hypercarbic resp failure and required intubation 9/22. PCCM will admit to ICU  Events Since Admission: 9/22 >>> intubated 9/26 >>> extubated 9/27 bscopy - thick stringy secretions, opened up left lung  Subjective: In chair , ronchi  Objective: Vital Signs: Temp:  [97.4 F (36.3 C)-98.5 F (36.9 C)] 97.7 F (36.5 C) (09/30 0808) Pulse Rate:  [65-83] 70  (09/30 0900) Resp:  [19-27] 25  (09/30 0900) BP: (94-154)/(55-87) 148/83 mmHg (09/30 0900) SpO2:  [78 %-100 %] 99 % (09/30 0900) FiO2 (%):  [50 %] 50 % (09/30 0129) Weight:  [83 kg (182 lb 15.7 oz)] 83 kg (182 lb 15.7 oz) (09/30 0500)  Intake/Output Summary (Last 24 hours) at 11/22/11 1147 Last data filed at 11/22/11 1000  Gross per 24 hour  Intake   1020 ml  Output   2160 ml  Net  -1140 ml    Physical Examination: General: NAD Neuro: interactive, non focal HEENT:  cushingoid apperance Neck:  No jvd, no stridor Cardiovascular:  s1 s2 rrr int tachy Lungs: ronchi bilat Abdomen: obese +bs Musculoskeletal:  intact Skin:  warm  ASSESSMENT AND PLAN  PULMONARY No results found for this basename: PHART:5,PCO2:5,PCO2ART:5,PO2ART:5,HCO3:5,O2SAT:5 in the last 168 hours Ventilator Settings: Vent Mode:  [-]  FiO2 (%):  [50 %] 50 % CXR:   No results found.  ETT:  9/22>> 9/26  A:   - VDRF in setting of OSA ,noncompliant w CPAP, diastolic and systolic CHF - Extubated 9/26.  - 9/27 CXR shows near atelectasis of left lung, 2/2 mucus plugging  -improved post bscopy   P:   - BD's - oob to chair - incentive spirometry - chest PT via vest  - NPO  -stat pcxr assess  collapse, asesss pcx rin am as well  CARDIOVASCULAR  Lab 11/22/11 0432 11/17/11 0449 11/16/11 0336  TROPONINI -- -- --  LATICACIDVEN -- -- --  PROBNP 16044.0* 78295.6* 36929.0*   ECG:   Lines:   A:  - CAD./CHF/Bifasicular block  -echo 7/13 ef 30%, gr 3 diast dysfn - troponin +, likely secondary to AE-CHF - elevated pro BNP P:  - Continue coreg, ntg paste - Hydralazine prn -normotension  RENAL  Lab 11/22/11 0432 11/21/11 0440 11/20/11 0953 11/19/11 0349 11/18/11 0325 11/16/11 0336  NA 144 150* 149* 148* 145 --  K 3.6 3.7 -- -- -- --  CL 103 106 104 103 102 --  CO2 34* 33* 34* 31 31 --  BUN 58* 59* 57* 44* 37* --  CREATININE 2.37* 2.56* 2.46* 2.28* 2.32* --  CALCIUM 9.0 9.1 9.2 9.2 9.1 --  MG -- -- -- -- 1.7 1.4*  PHOS -- -- -- -- 3.4 2.8   Intake/Output      09/29 0701 - 09/30 0700 09/30 0701 - 10/01 0700   P.O. 240 240   I.V. (mL/kg) 510.8 (6.2) 120 (1.4)   IV Piggyback     Total Intake(mL/kg) 750.8 (9) 360 (4.3)   Urine (mL/kg/hr) 2025 (1) 135 (0.3)   Total Output 2025 135   Net -1274.2 +225        Stool Occurrence 1 x  Foley:  9/22 >>   A:  - CRI P:   -  followed by Dr Eliott Nine -demadex, may need to hold if crt rises further  GASTROINTESTINAL No results found for this basename: AST:5,ALT:5,ALKPHOS:5,BILITOT:5,PROT:5,ALBUMIN:5 in the last 168 hours  A:  - NPO - failed swallow eval P:   - rpt eval for MBS/FEES modified needed  HEMATOLOGIC  Lab 11/22/11 0432 11/21/11 0440 11/20/11 0953 11/19/11 0349 11/18/11 0325  HGB 9.7* 9.5* 9.7* 10.0* 9.4*  HCT 34.0* 33.3* 34.0* 34.1* 30.3*  PLT 175 169 197 149* 117*  INR -- -- -- -- --  APTT -- -- -- -- --   A:   - Anemia - thrombocytopenia. ? Chronic , improved P:  - Monitor cbc in am -hep sub q tolerated  INFECTIOUS  Lab 11/22/11 0432 11/21/11 0440 11/20/11 0953 11/19/11 0349 11/18/11 0325  WBC 8.2 6.6 8.8 9.9 10.4  PROCALCITON -- -- -- -- --   Cultures: 9/22 sputum>> normal flora 9/22  Ucx>> candida 9/22 Bcx >> 1 of 2 bottles GPC >> micrococcus sp  Antibiotics: Vanco 9/23 >> 9/25  A:   - GPC on 1 of 2 cx, micrococcus. Likely contaminant - no current Abx P:   - Trend WBCs -follow any cultures from [facility] 27th- NF  ENDOCRINE  Lab 11/22/11 0807 11/22/11 0356 11/21/11 2354 11/21/11 1938 11/21/11 1615  GLUCAP 118* 117* 105* 113* 108*   A:   - DM - Glucose currently controlled P:   - SSI protocol  NEUROLOGIC  A:   - AMS due to hypercapnia >> resolved P:   - follow, pt active  BEST PRACTICE / DISPOSITION Level of Care:  ICU- tr to sdu Primary Service:  pccm Consultants:   Code Status:  full Diet:  NPO, reassess after formal swallow eval DVT Px:  heparin GI Px:  ppi Skin Integrity:  intact Social / Family:  Updated at bedside  Mcarthur Rossetti. Tyson Alias, MD, FACP Pgr: [phone] [place] [facility]

## 2011-11-23 ENCOUNTER — Inpatient Hospital Stay (HOSPITAL_COMMUNITY): Payer: Medicare Other

## 2011-11-23 DIAGNOSIS — I509 Heart failure, unspecified: Secondary | ICD-10-CM

## 2011-11-23 HISTORY — PX: TRACHEOSTOMY: SUR1362

## 2011-11-23 LAB — PROTIME-INR: Prothrombin Time: 14 seconds (ref 11.6–15.2)

## 2011-11-23 LAB — COMPREHENSIVE METABOLIC PANEL
Albumin: 2.6 g/dL — ABNORMAL LOW (ref 3.5–5.2)
Alkaline Phosphatase: 48 U/L (ref 39–117)
BUN: 50 mg/dL — ABNORMAL HIGH (ref 6–23)
CO2: 33 mEq/L — ABNORMAL HIGH (ref 19–32)
Chloride: 107 mEq/L (ref 96–112)
GFR calc non Af Amer: 25 mL/min — ABNORMAL LOW (ref >90)
Glucose, Bld: 124 mg/dL — ABNORMAL HIGH (ref 70–99)
Potassium: 3.8 mEq/L (ref 3.5–5.1)
Total Bilirubin: 0.7 mg/dL (ref 0.3–1.2)

## 2011-11-23 LAB — CBC WITH DIFFERENTIAL/PLATELET
Basophils Absolute: 0 10*3/uL (ref 0.0–0.1)
Eosinophils Absolute: 0 10*3/uL (ref 0.0–0.7)
Eosinophils Relative: 0 % (ref 0–5)
Lymphocytes Relative: 8 % — ABNORMAL LOW (ref 12–46)
MCV: 86.1 fL (ref 78.0–100.0)
Platelets: 194 10*3/uL (ref 150–400)
RDW: 17.5 % — ABNORMAL HIGH (ref 11.5–15.5)
WBC: 7.6 10*3/uL (ref 4.0–10.5)

## 2011-11-23 LAB — GLUCOSE, CAPILLARY
Glucose-Capillary: 112 mg/dL — ABNORMAL HIGH (ref 70–99)
Glucose-Capillary: 114 mg/dL — ABNORMAL HIGH (ref 70–99)
Glucose-Capillary: 119 mg/dL — ABNORMAL HIGH (ref 70–99)
Glucose-Capillary: 121 mg/dL — ABNORMAL HIGH (ref 70–99)
Glucose-Capillary: 125 mg/dL — ABNORMAL HIGH (ref 70–99)

## 2011-11-23 MED ORDER — PROPOFOL 10 MG/ML IV BOLUS
170.0000 mg | Freq: Once | INTRAVENOUS | Status: AC
  Administered 2011-11-23: 170 mg via INTRAVENOUS

## 2011-11-23 MED ORDER — FREE WATER
200.0000 mL | Freq: Three times a day (TID) | Status: DC
  Administered 2011-11-23 – 2011-11-25 (×7): 200 mL

## 2011-11-23 MED ORDER — FENTANYL CITRATE 0.05 MG/ML IJ SOLN: 25.0000 ug | INTRAMUSCULAR | Status: DC | PRN

## 2011-11-23 NOTE — Progress Notes (Signed)
Name: [name] MRN: [id number] DOB: 01-05-1941    LOS: 9  Referring Provider:  Oletta Lamas Reason for Referral:  Acute on chronic respiratory failure  PULMONARY / CRITICAL CARE MEDICINE  HPI:  71 yo with a plethora of health problems - OHS/OSA, diastolic and systolic CHF, COPD, DM, HTN. Discharged 3 days prior to this admit from SNF. Presented with hypercarbic resp failure and required intubation 9/22.  Events Since Admission: 9/22 >>> intubated 9/26 >>> extubated 9/27 bscopy - thick stringy secretions, opened up left lung 9/30 >>> bronch, complete left main left collapse, intubated  Subjective: No major vent changes  Objective: Vital Signs: Temp:  [98.3 F (36.8 C)-99.6 F (37.6 C)] 98.4 F (36.9 C) (10/01 0816) Pulse Rate:  [56-78] 67  (10/01 0900) Resp:  [12-31] 13  (10/01 0900) BP: (121-198)/(55-97) 152/55 mmHg (10/01 0900) SpO2:  [70 %-100 %] 97 % (10/01 0900) FiO2 (%):  [40 %-100 %] 40 % (10/01 0848) Weight:  [175 lb 11.3 oz (79.7 kg)] 175 lb 11.3 oz (79.7 kg) (10/01 0500)  Intake/Output Summary (Last 24 hours) at 11/23/11 0931 Last data filed at 11/23/11 0800  Gross per 24 hour  Intake    490 ml  Output   1435 ml  Net   -945 ml    Physical Examination: General: NAD Neuro: alert and follows command HEENT:  cushingoid apperance Neck:  No jvd, no stridor Cardiovascular:  s1 s2 rrr  Lungs: coarse breath sounds anteriorly Abdomen: obese +bs Musculoskeletal:  intact Skin:  warm  ASSESSMENT AND PLAN  PULMONARY  Lab 11/22/11 1826  PHART 7.455*  PCO2ART 54.7*  PO2ART 283.0*  HCO3 38.5*  O2SAT 100.0   Ventilator Settings: Vent Mode:  [-] CPAP;PSV FiO2 (%):  [40 %-100 %] 40 % Set Rate:  [12 bmp-18 bmp] 12 bmp Vt Set:  [440 mL] 440 mL PEEP:  [5 cmH20] 5 cmH20 Pressure Support:  [5 cmH20-10 cmH20] 10 cmH20 Plateau Pressure:  [15 cmH20-18 cmH20] 15 cmH20 CXR:   Dg Chest Port 1 View  11/23/2011  *RADIOLOGY REPORT*  Clinical Data:  Assess  endotracheal tube, atelectasis  PORTABLE CHEST - 1 VIEW  Comparison: Prior chest x-ray 11/22/2011  Findings: Unchanged position of endotracheal tube 2.5 cm superior to the carina.  Incompletely imaged nasogastric tube, the tip lies below the diaphragm likely within the stomach.  Slightly improved aeration of the right lung with decreasing interstitial edema. Unchanged dense left basilar opacity and right to left shift of the heart and mediastinal structures.  Left basilar opacity most likely represents atelectasis given the underlying volume loss.  IMPRESSION:  1.  Improving interstitial pulmonary edema 2.  Persistent left basilar opacity with volume loss as evidenced by right to left shift of the cardiac and mediastinal structures. This likely represents left lower lobe collapse.  Superimposed effusion, and infection is difficult to exclude. 3. Stable patchy right lower lobe opacity which may represent atelectasis and / or infiltrate 4.  Stable and satisfactory support apparatus   Original Report Authenticated By: Alvino Blood Chest Port 1 View  11/22/2011  *RADIOLOGY REPORT*  Clinical Data: Lung collapse, post intubation  PORTABLE CHEST - 1 VIEW  Comparison: Earlier same day; 11/20/2011; 11/19/2011; 11/17/2011; 09/06/2011  Findings:  Interval intubation with endotracheal tube overlying the tracheal air column with tip approximately 2.5 cm above the carina. No pneumothorax.  Improved aeration of the right upper lung with persistent obscuration of the left heart border secondary to left basilar consolidative opacities.  There is persistent left-sided volume loss with deviation of the cardiomediastinal structures to the left, possibly accentuated due to patient positioning.  The pulmonary vasculature is less distinct on the present examination.  Interval increase in right peri and infrahilar heterogeneous opacities.  No definite right-sided pleural effusion.  Unchanged bones.  IMPRESSION: 1.  Interval intubation  with endotracheal tube overlying tracheal air column with tip superior to the carina.  No pneumothorax. 2.  Improved aeration of the left upper lung with persistent left mid and lower lung consolidative opacities and associated volume loss.  Underlying infection is not excluded. 3.  Findings suggestive of mild pulmonary edema with worsening right peri and infrahilar opacities, possibly atelectasis.   Original Report Authenticated By: Waynard Reeds, M.D.    Dg Chest Port 1 View  11/22/2011  *RADIOLOGY REPORT*  Clinical Data: 71 year old female - cough and congestion.  PORTABLE CHEST - 1 VIEW  Comparison: 11/20/2011  Findings: The left hemithorax is opacified compatible with very large pleural effusion or left lung consolidation / collapse. Mild right basilar atelectasis is noted. There is no evidence of pneumothorax or acute bony abnormality.  IMPRESSION: Interval opacification left hemithorax, compatible with a very large pleural effusion or left lung consolidation/collapse.  No evidence of pneumothorax.  Right basilar atelectasis.   Original Report Authenticated By: Rosendo Gros, M.D.    Dg Shoulder Left  11/22/2011  *RADIOLOGY REPORT*  Clinical Data: 71 year old female with left shoulder pain.  LEFT SHOULDER - 2+ VIEW  Comparison: Chest radiograph performed today.  Findings: There is no evidence of acute fracture, subluxation or dislocation. The left hemithorax is opacified, compatible with very large pleural effusion or left lung consolidation/collapse. No focal bony lesions are identified.  IMPRESSION: No evidence of acute abnormalities in the left shoulder.  Opacified left hemithorax - question very large pleural effusion or left lung consolidation/collapse.   Original Report Authenticated By: Rosendo Gros, M.D.     ETT:  9/22>> 9/26 ETT 9/30>>>  A:   - VDRF in setting of OSA ,noncompliant w CPAP, diastolic and systolic CHF - Extubated 9/26 and re-intubated 9/30 for airway protection - 10/1  xray shows improving interstitial edema with left lower lobe collapse and superimposed effusion and infection that cannot be excluded   P:   - continue current vent settings, could consider small reduction in TV  - abg reviewed -may need some peep to improve LLL -for likely trach today -cpap 5 ps 5 to ps 10 pre - NPO  - pcxr in am -chest pt -hold mucomysts, bronchospasm risk  CARDIOVASCULAR  Lab 11/22/11 0432 11/17/11 0449  TROPONINI -- --  LATICACIDVEN -- --  PROBNP 16044.0* 22772.0*   ECG:   Lines:   A:  - CAD./CHF/Bifasicular block  -echo 7/13 ef 30%, gr 3 diast dysfn - troponin +, likely secondary to AE-CHF - elevated pro BNP, trending down since admission P:  - Continue coreg, ntg paste - continue torsemide - Hydralazine prn - some HTN -Re eval pos trach BP, if needed increase   RENAL  Lab 11/23/11 0443 11/22/11 0432 11/21/11 0440 11/20/11 0953 11/19/11 0349 11/18/11 0325  NA 151* 144 150* 149* 148* --  K 3.8 3.6 -- -- -- --  CL 107 103 106 104 103 --  CO2 33* 34* 33* 34* 31 --  BUN 50* 58* 59* 57* 44* --  CREATININE 1.94* 2.37* 2.56* 2.46* 2.28* --  CALCIUM 9.0 9.0 9.1 9.2 9.2 --  MG -- -- -- -- --  1.7  PHOS -- -- -- -- -- 3.4   Intake/Output      09/30 0701 - 10/01 0700 10/01 0701 - 10/02 0700   P.O. 240    I.V. (mL/kg) 513 (6.4) 29.5 (0.4)   NG/GT  60   Total Intake(mL/kg) 753 (9.4) 89.5 (1.1)   Urine (mL/kg/hr) 1540 (0.8)    Total Output 1540    Net -787 +89.5         Foley:  9/22 >>   A:  - improving creatinine P:   -  followed by Dr Eliott Nine - continue torsemide given stable creatinine -add free water  GASTROINTESTINAL  Lab 11/23/11 0443  AST 11  ALT 6  ALKPHOS 48  BILITOT 0.7  PROT 6.1  ALBUMIN 2.6*    A:  - NG tube: 9/30>>>  P:   - continue NG tube No tf for trach - IV protonix  HEMATOLOGIC  Lab 11/23/11 0443 11/22/11 0432 11/21/11 0440 11/20/11 0953 11/19/11 0349  HGB 9.9* 9.7* 9.5* 9.7* 10.0*  HCT 33.5* 34.0*  33.3* 34.0* 34.1*  PLT 194 175 169 197 149*  INR -- -- -- -- --  APTT -- -- -- -- --   A:   - Anemia: stable - thrombocytopenia. ? Chronic , improved P:  - cbc in am -hep sub q tolerated coags wnl  For trach  INFECTIOUS  Lab 11/23/11 0443 11/22/11 0432 11/21/11 0440 11/20/11 0953 11/19/11 0349  WBC 7.6 8.2 6.6 8.8 9.9  PROCALCITON -- -- -- -- --   Cultures: 9/22 sputum>> normal flora 9/22 Ucx>> candida 9/22 Bcx >> 1 of 2 bottles GPC >> micrococcus sp 9/27 tracheal aspirate >> normal flora Antibiotics: Vanco 9/23 >> 9/25  A:   - tracheal aspirate: no growth - CXR  - GPC on 1 of 2 cx, micrococcus. Likely contaminant - no current Abx - WBC normal P:   - trend WBC - hold antibiotics -was isolated left plug from mcahnical vocal cord issues If spike, add nosocomial coverage  ENDOCRINE  Lab 11/23/11 0811 11/23/11 0352 11/22/11 2357 11/22/11 2006 11/22/11 1221  GLUCAP 119* 118* 114* 94 129*   A:   - DM - Glucose currently controlled P:   - SSI protocol  NEUROLOGIC  A:   - alert and following command.  - versed  P:   - follow neuro status  BEST PRACTICE / DISPOSITION Level of Care:  ICU Primary Service:  pccm Consultants: Code Status:  full Diet:  NPO DVT Px:  heparin GI Px:  ppi Skin Integrity:  intact Social / Family:  Updated at bedside  Ccm time 30 min   I have fully examined this patient and agree with above findings.    And edited infull  Mcarthur Rossetti. Tyson Alias, MD, FACP Pgr: [phone] [place] [facility]

## 2011-11-23 NOTE — Progress Notes (Signed)
Nutrition Follow-up  Intervention:   1. Recommend initiation of Promote at 30 ml/hr, this is the goal rate. Also provide 30 ml Pro-stat 4 times daily. This EN regimen will provide 1120 kcal (70%), 105 gm protein (100%) and 604 ml free water.  2. Additional free water may be required if no IVF.  3. RD will continue to follow    Assessment:   Pt had advance to a D1-nectar diet on 9/29. Had bronch on 9/27 and 9/30. Required intubation to protect airway r/t complete left main collapse on 9/30. Planned for trach today per notes. No TF at this time for procedure.   Patient is currently intubated.  MV: 8.9  Temp:Temp (24hrs), Avg:98.5 F (36.9 C), Min:97.9 F (36.6 C), Max:99.6 F (37.6 C)  Propofol: none   Diet Order:  NPO TF: none   Meds: Scheduled Meds:   . albuterol-ipratropium  6 puff Inhalation Q6H  . antiseptic oral rinse  15 mL Mouth Rinse QID  . aspirin  81 mg Per Tube Daily  . atorvastatin  10 mg Oral QPM  . butamben-tetracaine-benzocaine  2 spray Topical Once  . carvedilol  12.5 mg Oral BID WC  . chlorhexidine  15 mL Mouth Rinse BID  . dexamethasone  10 mg Intravenous Q6H  . etomidate  15 mg Intravenous Once  . etomidate  40 mg Intravenous Once  . fentaNYL  200 mcg Intravenous Once  . free water  200 mL Per Tube Q8H  . heparin  5,000 Units Subcutaneous Q8H  . midazolam  5 mg Intravenous Once  . nicotine  7 mg Transdermal Q24H  . nitroGLYCERIN  1 inch Topical Q6H  . pantoprazole (PROTONIX) IV  40 mg Intravenous Q24H  . propofol  5-70 mcg/kg/min Intravenous Once  . torsemide  20 mg Oral Daily  . vecuronium  10 mg Intravenous Once  . DISCONTD: albuterol  2.5 mg Nebulization Q6H  . DISCONTD: etomidate  15 mg Intravenous Once  . DISCONTD: ipratropium  0.5 mg Nebulization Q6H  . DISCONTD: paricalcitol  1 mcg Oral Daily   Continuous Infusions:   . sodium chloride 10 mL/hr at 11/23/11 0700  . fentaNYL infusion INTRAVENOUS 25 mcg/hr (11/23/11 0800)  . midazolam  (VERSED) infusion 1 mg/hr (11/23/11 0800)   PRN Meds:.albuterol, fentaNYL, hydrALAZINE, labetalol, midazolam, phenylephrine, DISCONTD: fentaNYL, DISCONTD: fentaNYL, DISCONTD: midazolam  Labs:  CMP     Component Value Date/Time   NA 151* 11/23/2011 0443   K 3.8 11/23/2011 0443   CL 107 11/23/2011 0443   CO2 33* 11/23/2011 0443   GLUCOSE 124* 11/23/2011 0443   BUN 50* 11/23/2011 0443   CREATININE 1.94* 11/23/2011 0443   CALCIUM 9.0 11/23/2011 0443   PROT 6.1 11/23/2011 0443   ALBUMIN 2.6* 11/23/2011 0443   AST 11 11/23/2011 0443   ALT 6 11/23/2011 0443   ALKPHOS 48 11/23/2011 0443   BILITOT 0.7 11/23/2011 0443   GFRNONAA 25* 11/23/2011 0443   GFRAA 29* 11/23/2011 0443     Intake/Output Summary (Last 24 hours) at 11/23/11 1210 Last data filed at 11/23/11 0941  Gross per 24 hour  Intake  543.5 ml  Output   1355 ml  Net -811.5 ml    Weight Status:  175 lbs, trending down significantly from admission weight of 219 lbs.  Body mass index is 30.16 kg/(m^2). Obese class 1. Meets criteria for permissive underfeeding per ASPEN guidelines.   Re-estimated needs:  1609 kcal ( [phone] kcal underfeeding goal) and 100-120 gm protein  Nutrition Dx:  Inadequate oral intake now r/t inability to eat AEB mechanical ventilation   Goal:  Enteral nutrition to provide 60-70% of estimated calorie needs (22-25 kcals/kg ideal body weight) and >/= 90% of estimated protein needs, based on ASPEN guidelines for permissive underfeeding in critically ill obese individuals.   Monitor:  TF initiation, weight, labs, I/O's   Clarene Duke RD, LDN Pager [phone] After Hours pager [phone]

## 2011-11-23 NOTE — Progress Notes (Signed)
Bronchoscopy performed. Intervention BAL performed.  Billy Fischer, MD ; [facility] [phone].  After 5:30 PM or weekends, call [phone]

## 2011-11-23 NOTE — Progress Notes (Signed)
OT Cancellation Note  Treatment cancelled today due to medical issues with patient which prohibited therapy - as per PT.  Will hold OT eval today, and follow up tomorrow to check for appropriateness.  Jeani Hawking M [phone] 11/23/2011, 10:08 AM

## 2011-11-23 NOTE — Procedures (Signed)
Bedside Tracheostomy Insertion Procedure Note   Patient Details:   Name: [name] DOB: 1940/04/01 MRN: [id number]  Procedure: Tracheostomy  Pre Procedure Assessment: ET Tube Size:7.5 ET Tube secured at lip (cm):24  Bite block in place: No, Pt has no teeth. Breath Sounds: Rhonch  Post Procedure Assessment: BP 170/61  Pulse 65  Temp 97.9 F (36.6 C) (Oral)  Resp 0  Ht 5\' 4"  (1.626 m)  Wt 175 lb 11.3 oz (79.7 kg)  BMI 30.16 kg/m2  SpO2 96% O2 sats: stable throughout Complications: No apparent complications Patient did tolerate procedure well Tracheostomy Brand:Shiley Tracheostomy Style:Cuffed Tracheostomy Size: 6.0 Tracheostomy Secured BMW:[id number] Tracheostomy Placement Confirmation:Trach cuff visualized and in place    Kendall Flack Royal 11/23/2011, 3:20 PM

## 2011-11-23 NOTE — Progress Notes (Signed)
Patient became very combative towards staff and agitated. [facility] MD camera in and gave new sedation orders. Will continue to closely monitor.  Doree Albee

## 2011-11-23 NOTE — Progress Notes (Signed)
PT/OT Cancellation Note  Treatment cancelled today due to pt now sedated and on vent.  Spoke with RN noting plan today for Janina Mayo, will hold therapies and f/u tomorrow.    Sunny Schlein, [place] [phone] 11/23/2011, 8:03 AM

## 2011-11-23 NOTE — Procedures (Signed)
Procedure Perc trach See full dictation  6 placed Blood loss less 10 cc  Mcarthur Rossetti. Tyson Alias, MD, FACP Pgr: [phone] [place] [facility]

## 2011-11-23 NOTE — Progress Notes (Signed)
Speech Language Pathology Discharge Patient Details Name: [name] MRN: [id number] DOB: September 17, 1940 Today's Date: 11/23/2011 Time: 0954    Patient discharged from SLP services secondary to medical decline - will need to re-order SLP to resume therapy services. Pt currently orally intubated, plan for trach placement.  Please see latest therapy progress note for current level of functioning and progress toward goals.    Patient unable to participate in discharge planning and no caregivers available  GO  Celia B. Murvin Natal [facility] [place], CCC-SLP [phone]  Leigh Aurora 11/23/2011, 9:52 AM

## 2011-11-23 NOTE — Procedures (Signed)
Bronchoscopy  for Percutaneous  Tracheostomy  Name: [name] MRN: [id number] DOB: 1940-08-18 Procedure: Bronchoscopy for Percutaneous Tracheostomy Indications: Diagnostic evaluation of the airways, Obtain specimens for culture and/or other diagnostic studies and Remove secretions In conjunction with: Dr. Tyson Alias   Procedure Details Consent: Risks of procedure as well as the alternatives and risks of each were explained to the (patient/caregiver).  Consent for procedure obtained. Time Out: Verified patient identification, verified procedure, site/side was marked, verified correct patient position, special equipment/implants available, medications/allergies/relevent history reviewed, required imaging and test results available.  Performed  In preparation for procedure, patient was given 100% FiO2 and bronchoscope lubricated. Sedation: Benzodiazepines and Short-acting barbiturates 170 mg diprivan, fentanyl 250 mcg, versed 6 mg all iv. Airway entered and the following bronchi were examined: RUL, LUL and LLL.   Procedures performed: Endotracheal Tube retracted in 2 cm increments. Cannulation of airway observed. Dilation observed. Placement of trachel tube  observed . No overt complications. Bronchoscope removed.  , Patient placed back on 100% FiO2 at conclusion of procedure.    Evaluation Hemodynamic Status: BP stable throughout; O2 sats: stable throughout Patient's Current Condition: stable Specimens:  Sent serosanguinous fluid Complications: No apparent complications Patient did tolerate procedure well.   Brett Canales Minor ACNP [facility] PCCM Pager [phone] till 3 pm If no answer page [phone] 11/23/2011, 3:05 PM   I was present for and supervised the entire procedure  Billy Fischer, MD ; [facility] service Mobile [phone].  After 5:30 PM or weekends, call [phone]

## 2011-11-24 ENCOUNTER — Inpatient Hospital Stay (HOSPITAL_COMMUNITY): Payer: Medicare Other

## 2011-11-24 LAB — CBC WITH DIFFERENTIAL/PLATELET
Basophils Absolute: 0 10*3/uL (ref 0.0–0.1)
Eosinophils Relative: 0 % (ref 0–5)
Lymphs Abs: 0.7 10*3/uL (ref 0.7–4.0)
MCH: 25.2 pg — ABNORMAL LOW (ref 26.0–34.0)
MCV: 82.8 fL (ref 78.0–100.0)
Monocytes Absolute: 0.3 10*3/uL (ref 0.1–1.0)
Neutrophils Relative %: 90 % — ABNORMAL HIGH (ref 43–77)
Platelets: 193 10*3/uL (ref 150–400)
RBC: 4.12 MIL/uL (ref 3.87–5.11)
RDW: 17.5 % — ABNORMAL HIGH (ref 11.5–15.5)
WBC: 9.3 10*3/uL (ref 4.0–10.5)

## 2011-11-24 LAB — GLUCOSE, CAPILLARY
Glucose-Capillary: 110 mg/dL — ABNORMAL HIGH (ref 70–99)
Glucose-Capillary: 119 mg/dL — ABNORMAL HIGH (ref 70–99)
Glucose-Capillary: 123 mg/dL — ABNORMAL HIGH (ref 70–99)
Glucose-Capillary: 128 mg/dL — ABNORMAL HIGH (ref 70–99)
Glucose-Capillary: 129 mg/dL — ABNORMAL HIGH (ref 70–99)
Glucose-Capillary: 135 mg/dL — ABNORMAL HIGH (ref 70–99)

## 2011-11-24 LAB — BASIC METABOLIC PANEL
Calcium: 9.5 mg/dL (ref 8.4–10.5)
Creatinine, Ser: 1.9 mg/dL — ABNORMAL HIGH (ref 0.50–1.10)
GFR calc non Af Amer: 25 mL/min — ABNORMAL LOW (ref >90)
Glucose, Bld: 136 mg/dL — ABNORMAL HIGH (ref 70–99)
Sodium: 148 mEq/L — ABNORMAL HIGH (ref 135–145)

## 2011-11-24 MED ORDER — FENTANYL CITRATE 0.05 MG/ML IJ SOLN
25.0000 ug | INTRAMUSCULAR | Status: DC | PRN
  Administered 2011-11-24 – 2011-11-25 (×4): 50 ug via INTRAVENOUS
  Filled 2011-11-24 (×3): qty 2

## 2011-11-24 MED ORDER — POTASSIUM CHLORIDE 10 MEQ/50ML IV SOLN: 10.0000 meq | INTRAVENOUS | Status: DC

## 2011-11-24 MED ORDER — POTASSIUM CHLORIDE 10 MEQ/100ML IV SOLN
10.0000 meq | INTRAVENOUS | Status: AC
  Administered 2011-11-24 (×4): 10 meq via INTRAVENOUS
  Filled 2011-11-24: qty 300
  Filled 2011-11-24: qty 100

## 2011-11-24 MED ORDER — LABETALOL HCL 5 MG/ML IV SOLN
10.0000 mg | Freq: Once | INTRAVENOUS | Status: AC
  Administered 2011-11-24: 10 mg via INTRAVENOUS
  Filled 2011-11-24: qty 4

## 2011-11-24 MED ORDER — PROMOTE PO LIQD
1000.0000 mL | ORAL | Status: DC
  Administered 2011-11-24: 1000 mL
  Filled 2011-11-24 (×3): qty 1000

## 2011-11-24 MED ORDER — FENTANYL CITRATE 0.05 MG/ML IJ SOLN
INTRAMUSCULAR | Status: AC
  Administered 2011-11-24: 50 ug via INTRAVENOUS
  Filled 2011-11-24: qty 2

## 2011-11-24 MED ORDER — ADULT MULTIVITAMIN LIQUID CH
5.0000 mL | Freq: Every day | ORAL | Status: DC
  Administered 2011-11-24 – 2011-11-25 (×2): 5 mL
  Filled 2011-11-24 (×2): qty 5

## 2011-11-24 MED ORDER — PRO-STAT SUGAR FREE PO LIQD
30.0000 mL | Freq: Three times a day (TID) | ORAL | Status: DC
  Administered 2011-11-24 – 2011-11-25 (×3): 30 mL
  Filled 2011-11-24 (×6): qty 30

## 2011-11-24 NOTE — Evaluation (Signed)
[facility] Speaking [facility]ve - Evaluation Patient Details  Name: [name] MRN: [id number] Date of Birth: 11/10/1940  Today's Date: 11/24/2011 Time: 1350-1420 SLP Time Calculation (min): 30 min  Past Medical History:  Past Medical History  Diagnosis Date  . COPD (chronic obstructive pulmonary disease)     on home o2  . Chronic renal insufficiency   . Type II or unspecified type diabetes mellitus without mention of complication, not stated as uncontrolled   . CVA (cerebral infarction)     hx  . Hypertensive heart disease with congestive heart failure   . Chronic cor pulmonale   . Cardiomyopathy of undetermined type 09/07/2011  . Chronic kidney disease (CKD), stage IV (severe)   . Hyperlipdemia   . Obesity (BMI 30-39.9)   . Sleep apnea   . History of gout 09/07/2011  . Heart failure 7/13  . Shortness of breath   . Anemia   . Vascular disease   . Asthma    Past Surgical History:  Past Surgical History  Procedure Date  . Bilateral oophorectomy   . Cardiac catheterization     right heart cath   HPI:  71 year old female transferred to 2100 due to lung collapse. Pt reintubated. Trach placed 10/1. Orders received for PMSV evaluation.   Assessment / Plan / Recommendation Clinical Impression  Explained to pt about PMSV, rationale, steps taken toward use.  Pt tolerates cuff deflation with stable pulse, heart rate and O2 sats. Increase in productive cough noted after cuff deflation. Secretions suctioned with Yankauer.  Secretions were thin, slightly blood tinged. Pt indicated discomfort at trach site. RN informed. Unable to acheive voicing with finger occlustion. PMSV placed with RN present.  Pt unable to acheive voicing with PMSV in place, however, sats remained steady.  After 5 minutes O2 sats dropped to high 80s. PMV removed, cuff left deflated. RN aware.    SLP Assessment  Patient needs continued Speech Lanaguage Pathology Services    Follow Up Recommendations  24 hour  supervision/assistance    Frequency and Duration min 2x/week  1 week   Pertinent Vitals/Pain Pt indicated discomfort at trach site. RN notified.    SLP Goals Potential to Achieve Goals: Good Potential Considerations: Family/community support Progress/Goals/Alternative treatment plan discussed with pt/caregiver and they: Agree SLP Goal #1: Pt will tolerate PMSV for 15 minutes with stable O2, HR and RR. SLP Goal #1 - Progress: Progressing toward goal   PMSV Trial  PMSV was placed for: 5 minutes Able to redirect subglottic air through upper airway: Yes Able to Attain Phonation: No Voice Quality: Aphonic Able to Expectorate Secretions: No Breath Support for Phonation: Other (comment) (unable to phonate) Respirations During Trial: 26  SpO2 During Trial: 95 % Pulse During Trial: 75  Behavior: Anxious;Tense   Tracheostomy Tube  Additional Tracheostomy Tube Assessment Secretion Description: Thin, moderate amount, slightly bloodtinged Level of Secretion Expectoration: Tracheal    Vent Dependency  None   Cuff Deflation Trial Tolerated Cuff Deflation: Yes Length of Time for Cuff Deflation Trial: 15 minutes Behavior: Good eye contact;Anxious;Alert;Tense    Leigh Aurora 11/24/2011, 2:39 PM Celia B. Bueche, MSP, CCC-SLP [phone]

## 2011-11-24 NOTE — Progress Notes (Signed)
Physical Therapy Treatment Patient Details Name: [name] MRN: [id number] DOB: 09-09-1940 Today's Date: 11/24/2011 Time: 4098-1191 PT Time Calculation (min): 23 min  PT Assessment / Plan / Recommendation Comments on Treatment Session  pt presents with Respiratory Failure, now with Trach on Trach collar.  pt's O2 sats remained in 90's throughout mobility.  Noted plan for Ltach soon.      Follow Up Recommendations  Post acute inpatient rehab    Barriers to Discharge        Equipment Recommendations  None recommended by PT    Recommendations for Other Services    Frequency Min 3X/week   Plan Discharge plan remains appropriate;Frequency remains appropriate    Precautions / Restrictions Precautions Precautions: Fall Precaution Comments: O2 sats.   Restrictions Weight Bearing Restrictions: No   Pertinent Vitals/Pain Indicates neck is sore.  Premedicated.      Mobility  Bed Mobility Bed Mobility: Supine to Sit;Sitting - Scoot to Edge of Bed Supine to Sit: 1: +2 Total assist Supine to Sit: Patient Percentage: 60% Sitting - Scoot to Edge of Bed: 4: Min assist Details for Bed Mobility Assistance: cues for sequencing, safe technique Transfers Transfers: Sit to Stand;Stand to Sit;Stand Pivot Transfers Sit to Stand: 1: +2 Total assist;With upper extremity assist;From bed Sit to Stand: Patient Percentage: 70% Stand to Sit: 1: +2 Total assist;With upper extremity assist;To chair/3-in-1;With armrests Stand to Sit: Patient Percentage: 70% Stand Pivot Transfers: 1: +2 Total assist Stand Pivot Transfers: Patient Percentage: 80% Details for Transfer Assistance: cues for use of UEs, sequencing SPT.   Ambulation/Gait Ambulation/Gait Assistance: Not tested (comment) Stairs: No Wheelchair Mobility Wheelchair Mobility: No    Exercises     PT Diagnosis:    PT Problem List:   PT Treatment Interventions:     PT Goals Acute Rehab PT Goals Time For Goal Achievement:  12/04/11 PT Goal: Supine/Side to Sit - Progress: Progressing toward goal PT Goal: Sit to Stand - Progress: Progressing toward goal PT Goal: Stand to Sit - Progress: Progressing toward goal  Visit Information  Last PT Received On: 11/24/11 Assistance Needed: +2 PT/OT Co-Evaluation/Treatment: Yes    Subjective Data  Subjective: pt now with trach collar.  nods when discussing sitting.     Cognition  Overall Cognitive Status: Appears within functional limits for tasks assessed/performed Arousal/Alertness: Awake/alert Orientation Level: Appears intact for tasks assessed Behavior During Session: [facility] for tasks performed    Balance  Balance Balance Assessed: No  End of Session PT - End of Session Equipment Utilized During Treatment: Oxygen Activity Tolerance: Patient limited by fatigue Patient left: in chair;with call bell/phone within reach Nurse Communication: Mobility status   GP     Sunny Schlein, [place] [phone] 11/24/2011, 12:19 PM

## 2011-11-24 NOTE — Evaluation (Signed)
Occupational Therapy Evaluation Patient Details Name: [name] MRN: [id number] DOB: October 06, 1940 Today's Date: 11/24/2011 Time: 4132-4401 OT Time Calculation (min): 30 min  OT Assessment / Plan / Recommendation Clinical Impression  Pt admitted with respiratory failure requiring intermittent intubation.  She has a complicated medical history.  She currently has a trach with 02.  Pt requires some encouragement for OOB.  She will benefit from OT to address the below deficit areas.    OT Assessment  Patient needs continued OT Services    Follow Up Recommendations  LTACH    Barriers to Discharge      Equipment Recommendations  None recommended by OT    Recommendations for Other Services    Frequency  Min 2X/week    Precautions / Restrictions Precautions Precautions: Fall Precaution Comments: Pt on trach collar with 02.  Watch sats. Restrictions Weight Bearing Restrictions: No   Pertinent Vitals/Pain     ADL  Eating/Feeding: NPO Where Assessed - Eating/Feeding: Edge of bed Grooming: Performed;Wash/dry face;Moderate assistance Where Assessed - Grooming: Supported sitting Upper Body Bathing: Simulated;+1 Total assistance Where Assessed - Upper Body Bathing: Unsupported sitting Lower Body Bathing: Simulated;+1 Total assistance Where Assessed - Lower Body Bathing: Supported sit to stand Upper Body Dressing: Simulated;Maximal assistance Where Assessed - Upper Body Dressing: Unsupported sitting Lower Body Dressing: Performed;+1 Total assistance Where Assessed - Lower Body Dressing: Unsupported sitting Equipment Used: Gait belt Transfers/Ambulation Related to ADLs: +2 total assist, pt 02% to chair.    OT Diagnosis: Generalized weakness  OT Problem List: Decreased strength;Decreased activity tolerance;Impaired balance (sitting and/or standing);Decreased knowledge of use of DME or AE;Cardiopulmonary status limiting activity;Obesity;Impaired UE functional use OT Treatment  Interventions: Self-care/ADL training;Therapeutic exercise;DME and/or AE instruction;Patient/family education;Balance training   OT Goals Acute Rehab OT Goals OT Goal Formulation: With patient Time For Goal Achievement: 12/08/11 Potential to Achieve Goals: Good ADL Goals Pt Will Perform Grooming: with min assist;Sitting, edge of bed ADL Goal: Grooming - Progress: Goal set today Miscellaneous OT Goals Miscellaneous OT Goal #1: Pt will tolerate sitting EOB x10 minutes while engaged in activity. OT Goal: Miscellaneous Goal #1 - Progress: Goal set today Miscellaneous OT Goal #2: Pt will perform supine to sit at EOB with min assist in prep for ADL. OT Goal: Miscellaneous Goal #2 - Progress: Goal set today Miscellaneous OT Goal #3: Pt will demonstrate strength at 4/5 B UEs. OT Goal: Miscellaneous Goal #3 - Progress: Goal set today  Visit Information  Last OT Received On: 11/24/11 Assistance Needed: +2 PT/OT Co-Evaluation/Treatment: Yes    Subjective Data  Subjective: Pt has a trach, mouths words.  Able to report that she has chronic back pain. Patient Stated Goal: Did not state.   Prior Functioning     Home Living Lives With: Alone Available Help at Discharge: Family Type of Home: House Home Access: Stairs to enter Secretary/administrator of Steps: 4 Entrance Stairs-Rails: Right;Left Home Layout: One level Home Adaptive Equipment: Straight cane;Walker - rolling Prior Function Level of Independence: Needs assistance Needs Assistance: Gait Gait Assistance: with RW Able to Take Stairs?: Yes Driving: No Vocation: Retired Musician: Tracheostomy Dominant Hand: Right         Vision/Perception     Cognition  Overall Cognitive Status: Appears within functional limits for tasks assessed/performed Arousal/Alertness: Awake/alert Orientation Level: Appears intact for tasks assessed Behavior During Session: [facility] for tasks performed Cognition - Other  Comments: Pt is somewhat HOH.    Extremity/Trunk Assessment Right Upper Extremity Assessment RUE ROM/Strength/Tone: Deficits  RUE ROM/Strength/Tone Deficits: 2+/5 shoulder, 3/5 elbow, 4/5 gross grasp Left Upper Extremity Assessment LUE ROM/Strength/Tone: Deficits LUE ROM/Strength/Tone Deficits: 2+/5 shoulder, 3/5 elbow, 4/5 gross grip Trunk Assessment Trunk Assessment:  (difficulty attaining full upright position)     Mobility Bed Mobility Bed Mobility: Supine to Sit;Sitting - Scoot to Edge of Bed Supine to Sit: 1: +2 Total assist Supine to Sit: Patient Percentage: 60% Sitting - Scoot to Edge of Bed: 4: Min assist Details for Bed Mobility Assistance: cues for sequencing, safe technique Transfers Transfers: Sit to Stand;Stand to Sit Sit to Stand: 1: +2 Total assist;From bed;With upper extremity assist Sit to Stand: Patient Percentage: 70% Stand to Sit: 1: +2 Total assist;With upper extremity assist;To chair/3-in-1;With armrests Stand to Sit: Patient Percentage: 70% Details for Transfer Assistance: cues for use of UEs, sequencing SPT.       Shoulder Instructions     Exercise     Balance Balance Balance Assessed: Yes Static Sitting Balance Static Sitting - Balance Support: Feet supported Static Sitting - Level of Assistance: 5: Stand by assistance Static Sitting - Comment/# of Minutes: 5   End of Session OT - End of Session Equipment Utilized During Treatment: Gait belt Activity Tolerance: Patient limited by fatigue Patient left: in chair;with call bell/phone within reach Nurse Communication: Mobility status  GO     Evern Bio 11/24/2011, 1:35 PM [phone]

## 2011-11-24 NOTE — Progress Notes (Addendum)
Nutrition Follow-up/consult  Intervention:   1. Initiate of Promote at 30 ml/hr via NG tube, this is the goal rate. Also provide 30 ml Pro-stat 3 times daily. This EN regimen will provide 1020 kcal (67%), 90 gm protein (90%) and 604 ml free water.  2. Continue free water flushes, 200 ml q 8 hr 3. Daily multivitamin per tube 4. RD will continue to follow    Assessment:   S/p bedside trach 10/1, continues with ventilator support. Has NG tube, tip placement not visualized in CXR.  RD consulted for initiation and management of EN.  Per notes, planned to d/c to [facility] soon.   Diet Order:  NPO  Meds: Scheduled Meds:   . albuterol-ipratropium  6 puff Inhalation Q6H  . antiseptic oral rinse  15 mL Mouth Rinse QID  . aspirin  81 mg Per Tube Daily  . atorvastatin  10 mg Oral QPM  . carvedilol  12.5 mg Oral BID WC  . chlorhexidine  15 mL Mouth Rinse BID  . dexamethasone  10 mg Intravenous Q6H  . fentaNYL  200 mcg Intravenous Once  . free water  200 mL Per Tube Q8H  . heparin  5,000 Units Subcutaneous Q8H  . labetalol  10 mg Intravenous Once  . midazolam  5 mg Intravenous Once  . nicotine  7 mg Transdermal Q24H  . nitroGLYCERIN  1 inch Topical Q6H  . pantoprazole (PROTONIX) IV  40 mg Intravenous Q24H  . potassium chloride  10 mEq Intravenous Q1 Hr x 4  . propofol  170 mg Intravenous Once  . torsemide  20 mg Oral Daily  . DISCONTD: etomidate  40 mg Intravenous Once  . DISCONTD: potassium chloride  10 mEq Intravenous Q1 Hr x 4  . DISCONTD: propofol  5-70 mcg/kg/min Intravenous Once  . DISCONTD: vecuronium  10 mg Intravenous Once   Continuous Infusions:   . sodium chloride 10 mL/hr at 11/23/11 0700  . fentaNYL infusion INTRAVENOUS Stopped (11/24/11 0730)  . midazolam (VERSED) infusion Stopped (11/24/11 0730)   PRN Meds:.albuterol, fentaNYL, hydrALAZINE, labetalol, midazolam, phenylephrine, DISCONTD: fentaNYL  Labs:  CMP     Component Value Date/Time   NA 148* 11/24/2011 0455   K  3.2* 11/24/2011 0455   CL 104 11/24/2011 0455   CO2 32 11/24/2011 0455   GLUCOSE 136* 11/24/2011 0455   BUN 54* 11/24/2011 0455   CREATININE 1.90* 11/24/2011 0455   CALCIUM 9.5 11/24/2011 0455   PROT 6.1 11/23/2011 0443   ALBUMIN 2.6* 11/23/2011 0443   AST 11 11/23/2011 0443   ALT 6 11/23/2011 0443   ALKPHOS 48 11/23/2011 0443   BILITOT 0.7 11/23/2011 0443   GFRNONAA 25* 11/24/2011 0455   GFRAA 30* 11/24/2011 0455     Intake/Output Summary (Last 24 hours) at 11/24/11 1220 Last data filed at 11/24/11 0800  Gross per 24 hour  Intake 1180.4 ml  Output    890 ml  Net  290.4 ml    Weight Status:  177 lbs Admission weight: 219 lbs  MV: 7.9  Temp:Temp (24hrs), Avg:98.2 F (36.8 C), Min:97.4 F (36.3 C), Max:98.7 F (37.1 C) Propofol: none  Re-estimated needs:  1506 kcal [phone] kcal underfeeding goal) 100-120 gm protein  Nutrition Dx: Inadequate oral intake now r/t inability to eat AEB mechanical ventilation   Goal: Enteral nutrition to provide 60-70% of estimated calorie needs (22-25 kcals/kg ideal body weight) and >/= 90% of estimated protein needs, based on ASPEN guidelines for permissive underfeeding in critically ill obese  individuals.   Monitor: TF initiation, weight, labs, I/O's    Clarene Duke RD, LDN Pager [phone] After Hours pager [phone]

## 2011-11-24 NOTE — Op Note (Signed)
[name], [name] NO.:  [id number]  MEDICAL RECORD NO.:  [id number]  LOCATION:  2109                         FACILITY:  [facility]  PHYSICIAN:  Nelda Bucks, MD DATE OF BIRTH:  1940-09-23  DATE OF PROCEDURE: DATE OF DISCHARGE:                              OPERATIVE REPORT   PROCEDURE:  Percutaneous tracheostomy.  PREOPERATIVE DIAGNOSES:  Severe obstructive sleep apnea, severe chronic obstructive pulmonary disease with recurrent left lung collapse and pneumonia and apparent edema of the vocal cords with a small orifice for ventilation.  POSTOPERATIVE DIAGNOSIS:  Status post tracheostomy secondary to the above complicated diagnosis mainly from upper airway obstruction.  BRONCHOSCOPIST FOR THE PROCEDURE:  Oley Balm. Simonds, MD  PROCEDURE:  Consent was obtained from the patient's daughter, fully aware of the risks and benefits of the procedure including infection, bleeding, pneumothorax, and death.  Coagulation panel was all within normal limits.  The patient was placed in supine position. Chlorhexidine preparation used to sterilize the operative site.  The bronchoscopist placed the bronchoscope through the endotracheal tube, backed up to approximately 17 cm.  I made a 1-cm vertical incision over what was thought to be the patient's second endotracheal space. Dissection was made down and identified the strap muscles, also identified a moderate-sized anterior jugular vein, which was ligated with four 3-0 silk sutures successfully with good hemostasis. Continuing to make dissection down to the tracheal planes and recognized that the cricoid cartilage with somewhat more inferior than anticipated. I then placed a small cricoid hook underneath the cric and pulled up with the assistance of Dr. Gwenlyn Saran, family practice resident with some small minor traction upward, we were able to get some good exposure of the first and second endotracheal space.  After this  exposure, I then placed an 18-gauge needle through the approximately first and second endotracheal space into the airway with the white catheter sheet.  The needle was removed.  The white catheter sheath remained.  I then placed the wire through the white catheter sheath and white catheter sheath was removed.  At the end, the patient of course received epinephrine with lidocaine injected approximately 7 mL into the operative site.  The wire then was palpated to be approximately first endotracheal space clearly below the cricoid cartilage.  The hooks were removed because we now had accessed using the wire percutaneously.  I then placed a 14-French punch dilator in the airway.  I then placed a progressive Rhino dilator over a glider, approximately 30-French dilator was removed.  I then progressed size 6 tracheostomy over 26-French dilator in and out of the airway. The Shiley tracheostomy remained,  everything else removed.  The bronchoscopist placed the bronchoscope through the new tracheostomy and noted the carina approximately 5 cm below without any apparent posterior wall injury.  Blood loss for the procedure was approximately 8 mL.  The patient tolerated the procedure quite well with normal hemodynamics. Postoperative chest x-ray revealed a well-placed tracheostomy.  No apparent complications in regard to these.     Nelda Bucks, MD     DJF/MEDQ  D:  11/23/2011  T:  11/24/2011  Job:  [id number]

## 2011-11-24 NOTE — Progress Notes (Addendum)
Name: [name] MRN: [id number] DOB: 1940-06-08    LOS: 10  Referring Provider:  Oletta Lamas Reason for Referral:  Acute on chronic respiratory failure  PULMONARY / CRITICAL CARE MEDICINE  HPI:  71 yo with multiple health problems - OHS/OSA, diastolic and systolic CHF, COPD, DM, HTN. Discharged 3 days prior to this admit from SNF. Presented with hypercarbic resp failure and required intubation 9/22. Was re-intubated on 9/30 after bronch due to small airway and subsequently trached on 10/1  Events Since Admission: 9/22 >>> intubated 9/26 >>> extubated 9/27 bscopy - thick stringy secretions, opened up left lung 9/30 >>> bronch, complete left main left collapse, intubated 10/1>>>tracheostomy  Subjective: Tracheostomy yesterday. Tolerated well  Objective: Vital Signs: Temp:  [97.4 F (36.3 C)-98.7 F (37.1 C)] 97.4 F (36.3 C) (10/02 0403) Pulse Rate:  [52-69] 61  (10/02 0730) Resp:  [0-23] 19  (10/02 0730) BP: (114-198)/(54-105) 153/74 mmHg (10/02 0730) SpO2:  [95 %-100 %] 97 % (10/02 0730) FiO2 (%):  [40 %] 40 % (10/02 0715) Weight:  [177 lb 11.1 oz (80.6 kg)] 177 lb 11.1 oz (80.6 kg) (10/02 0403)  Intake/Output Summary (Last 24 hours) at 11/24/11 0743 Last data filed at 11/24/11 0702  Gross per 24 hour  Intake 1261.4 ml  Output   1115 ml  Net  146.4 ml    Physical Examination: General: NAD Neuro: alert and follows command HEENT:  cushingoid appearance, tracheostomy site clean Neck:  No jvd, no stridor Cardiovascular:  s1 s2 rrr  Lungs: diminished breath sounds on left side compared to right Abdomen: obese +bs Musculoskeletal:  intact Skin:  Warm Ext: trace pedal edema  ASSESSMENT AND PLAN  PULMONARY  Lab 11/22/11 1826  PHART 7.455*  PCO2ART 54.7*  PO2ART 283.0*  HCO3 38.5*  O2SAT 100.0   Ventilator Settings: Vent Mode:  [-] PRVC FiO2 (%):  [40 %] 40 % Set Rate:  [12 bmp] 12 bmp Vt Set:  [440 mL] 440 mL PEEP:  [5 cmH20] 5 cmH20 Pressure Support:   [10 cmH20] 10 cmH20 Plateau Pressure:  [15 cmH20-18 cmH20] 15 cmH20 CXR:   Chest Portable 1 View To Assess Tube Placement And Rule-out Pneumothorax  11/23/2011  *RADIOLOGY REPORT*  Clinical Data: Tracheostomy  PORTABLE CHEST - 1 VIEW  Comparison: 11/23/2011  Findings: Tracheostomy 6.5 cm above the carina.  NG tube enters the stomach with the tip not visualized, unchanged.  Dense consolidation/airspace disease persist in the lingula and left lower lobe.  Stable mild vascular congestion versus interstitial edema.  No enlarging effusion or pneumothorax.  IMPRESSION: Tracheostomy 6.5 cm above the carina.  No pneumothorax.  Stable left base consolidation/collapse.   Original Report Authenticated By: Judie Petit. Ruel Favors, M.D.    Dg Chest Port 1 View  11/23/2011  *RADIOLOGY REPORT*  Clinical Data:  Assess endotracheal tube, atelectasis  PORTABLE CHEST - 1 VIEW  Comparison: Prior chest x-ray 11/22/2011  Findings: Unchanged position of endotracheal tube 2.5 cm superior to the carina.  Incompletely imaged nasogastric tube, the tip lies below the diaphragm likely within the stomach.  Slightly improved aeration of the right lung with decreasing interstitial edema. Unchanged dense left basilar opacity and right to left shift of the heart and mediastinal structures.  Left basilar opacity most likely represents atelectasis given the underlying volume loss.  IMPRESSION:  1.  Improving interstitial pulmonary edema 2.  Persistent left basilar opacity with volume loss as evidenced by right to left shift of the cardiac and mediastinal structures. This likely represents  left lower lobe collapse.  Superimposed effusion, and infection is difficult to exclude. 3. Stable patchy right lower lobe opacity which may represent atelectasis and / or infiltrate 4.  Stable and satisfactory support apparatus   Original Report Authenticated By: Alvino Blood Chest Port 1 View  11/22/2011  *RADIOLOGY REPORT*  Clinical Data: Lung collapse, post  intubation  PORTABLE CHEST - 1 VIEW  Comparison: Earlier same day; 11/20/2011; 11/19/2011; 11/17/2011; 09/06/2011  Findings:  Interval intubation with endotracheal tube overlying the tracheal air column with tip approximately 2.5 cm above the carina. No pneumothorax.  Improved aeration of the right upper lung with persistent obscuration of the left heart border secondary to left basilar consolidative opacities. There is persistent left-sided volume loss with deviation of the cardiomediastinal structures to the left, possibly accentuated due to patient positioning.  The pulmonary vasculature is less distinct on the present examination.  Interval increase in right peri and infrahilar heterogeneous opacities.  No definite right-sided pleural effusion.  Unchanged bones.  IMPRESSION: 1.  Interval intubation with endotracheal tube overlying tracheal air column with tip superior to the carina.  No pneumothorax. 2.  Improved aeration of the left upper lung with persistent left mid and lower lung consolidative opacities and associated volume loss.  Underlying infection is not excluded. 3.  Findings suggestive of mild pulmonary edema with worsening right peri and infrahilar opacities, possibly atelectasis.   Original Report Authenticated By: Waynard Reeds, M.D.    Dg Chest Port 1 View  11/22/2011  *RADIOLOGY REPORT*  Clinical Data: 71 year old female - cough and congestion.  PORTABLE CHEST - 1 VIEW  Comparison: 11/20/2011  Findings: The left hemithorax is opacified compatible with very large pleural effusion or left lung consolidation / collapse. Mild right basilar atelectasis is noted. There is no evidence of pneumothorax or acute bony abnormality.  IMPRESSION: Interval opacification left hemithorax, compatible with a very large pleural effusion or left lung consolidation/collapse.  No evidence of pneumothorax.  Right basilar atelectasis.   Original Report Authenticated By: Rosendo Gros, M.D.    Dg Shoulder  Left  11/22/2011  *RADIOLOGY REPORT*  Clinical Data: 71 year old female with left shoulder pain.  LEFT SHOULDER - 2+ VIEW  Comparison: Chest radiograph performed today.  Findings: There is no evidence of acute fracture, subluxation or dislocation. The left hemithorax is opacified, compatible with very large pleural effusion or left lung consolidation/collapse. No focal bony lesions are identified.  IMPRESSION: No evidence of acute abnormalities in the left shoulder.  Opacified left hemithorax - question very large pleural effusion or left lung consolidation/collapse.   Original Report Authenticated By: Rosendo Gros, M.D.     ETT:  9/22>> 9/26 ETT 9/30>>> Tracheostomy 10/1>>> A:   - VDRF in setting of OSA ,noncompliant w CPAP, diastolic and systolic CHF - Extubated 9/26 and re-intubated 9/30 for airway protection. Tracheostomy 10/1 due to small airway - 10/2 xray pending  P:   - continue current vent settings  - pcxr in am for monitoring of left lower lobe collapse - transfer to step down for trach management  CARDIOVASCULAR  Lab 11/22/11 0432  TROPONINI --  LATICACIDVEN --  PROBNP 16044.0*   ECG:   Lines: peripheral IV  A:  - CAD./CHF/Bifasicular block  -echo 7/13 ef 30%, gr 3 diast dysfn - troponin +, likely secondary to AE-CHF - elevated pro BNP, trending down since admission P:  - Continue coreg, ntg paste - continue torsemide - Hydralazine and labetolol prn given  for HTN Consider starting po hydralazine    RENAL  Lab 11/24/11 0455 11/23/11 0443 11/22/11 0432 11/21/11 0440 11/20/11 0953 11/18/11 0325  NA 148* 151* 144 150* 149* --  K 3.2* 3.8 -- -- -- --  CL 104 107 103 106 104 --  CO2 32 33* 34* 33* 34* --  BUN 54* 50* 58* 59* 57* --  CREATININE 1.90* 1.94* 2.37* 2.56* 2.46* --  CALCIUM 9.5 9.0 9.0 9.1 9.2 --  MG -- -- -- -- -- 1.7  PHOS -- -- -- -- -- 3.4   Intake/Output      10/01 0701 - 10/02 0700 10/02 0701 - 10/03 0700   P.O.     I.V. (mL/kg) 401.4 (5)     NG/GT 760    IV Piggyback 0 100   Total Intake(mL/kg) 1161.4 (14.4) 100 (1.2)   Urine (mL/kg/hr) 1115 (0.6)    Total Output 1115    Net +46.4 +100         Foley:  9/22 >>   A:  - improving creatinine - hypokalemic P:   -  followed by Dr Eliott Nine - continue torsemide given stable creatinine - hypernatremia treated with free water  - KCl IV x4 for hyponatremia  GASTROINTESTINAL  Lab 11/23/11 0443  AST 11  ALT 6  ALKPHOS 48  BILITOT 0.7  PROT 6.1  ALBUMIN 2.6*    A:  - NG tube: 9/30>>>  P:   - continue NG tube - nutrition consult for tube feeds - d/c protonix  HEMATOLOGIC  Lab 11/24/11 0455 11/23/11 0912 11/23/11 0443 11/22/11 0432 11/21/11 0440 11/20/11 0953  HGB 10.4* -- 9.9* 9.7* 9.5* 9.7*  HCT 34.1* -- 33.5* 34.0* 33.3* 34.0*  PLT 193 -- 194 175 169 197  INR -- 1.09 -- -- -- --  APTT -- 24 -- -- -- --   A:   - Anemia: stable - thrombocytopenia. ? Chronic , improved P:  - cbc in am -hep sub q tolerated  INFECTIOUS  Lab 11/24/11 0455 11/23/11 0443 11/22/11 0432 11/21/11 0440 11/20/11 0953  WBC 9.3 7.6 8.2 6.6 8.8  PROCALCITON -- -- -- -- --   Cultures: 9/22 sputum>> normal flora 9/22 Ucx>> candida 9/22 Bcx >> 1 of 2 bottles GPC >> micrococcus sp 9/27 tracheal aspirate >> normal flora Antibiotics: Vanco 9/23 >> 9/25  A:   - tracheal aspirate: no growth - GPC on 1 of 2 cx, micrococcus. Likely contaminant - no current Abx - WBC normal P:   - continue monitoring for any fevers.    ENDOCRINE  Lab 11/24/11 0401 11/24/11 0007 11/23/11 1950 11/23/11 1606 11/23/11 1155  GLUCAP 123* 128* 125* 121* 112*   A:   - DM - Glucose currently controlled P:   - SSI protocol  NEUROLOGIC  A:   - agitation overnight prompted starting versed and fentanyl. Now following commands and alert P:   - d/c sedation  BEST PRACTICE / DISPOSITION Level of Care:  ICU>>>transfer to step down Primary Service:  pccm Consultants: Code Status:  full Diet:   NPO DVT Px:  heparin GI Px:  discontinued Skin Integrity:  intact Social / Family:  Updated at bedside  She may go to [facility] as early as 10/03 AM  I have interviewed and examined the patient and reviewed the database. I have formulated the assessment and plan as reflected in the note above with amendments made by me. 40 mins of direct critical care time provided  Billy Fischer,  MD;  PCCM service; Mobile [phone]

## 2011-11-25 ENCOUNTER — Inpatient Hospital Stay (HOSPITAL_COMMUNITY): Payer: Medicare Other

## 2011-11-25 ENCOUNTER — Other Ambulatory Visit (HOSPITAL_COMMUNITY): Payer: Self-pay

## 2011-11-25 ENCOUNTER — Inpatient Hospital Stay
Admission: AD | Admit: 2011-11-25 | Discharge: 2011-12-24 | Disposition: A | Discharge status: Skilled Nursing Facility | Payer: Self-pay | Source: Ambulatory Visit | Attending: Internal Medicine | Admitting: Internal Medicine

## 2011-11-25 LAB — GLUCOSE, CAPILLARY
Glucose-Capillary: 134 mg/dL — ABNORMAL HIGH (ref 70–99)
Glucose-Capillary: 140 mg/dL — ABNORMAL HIGH (ref 70–99)

## 2011-11-25 LAB — BASIC METABOLIC PANEL
Calcium: 9.5 mg/dL (ref 8.4–10.5)
GFR calc Af Amer: 29 mL/min — ABNORMAL LOW (ref >90)
GFR calc non Af Amer: 25 mL/min — ABNORMAL LOW (ref >90)
Potassium: 3.4 mEq/L — ABNORMAL LOW (ref 3.5–5.1)
Sodium: 145 mEq/L (ref 135–145)

## 2011-11-25 MED ORDER — NITROGLYCERIN 2 % TD OINT: 1.0000 [in_us] | TOPICAL_OINTMENT | Freq: Four times a day (QID) | TRANSDERMAL | Status: DC

## 2011-11-25 MED ORDER — PROMOTE PO LIQD: 1000.0000 mL | ORAL | Status: DC

## 2011-11-25 MED ORDER — FREE WATER: 200.0000 mL | Freq: Three times a day (TID) | Status: DC

## 2011-11-25 MED ORDER — PRO-STAT SUGAR FREE PO LIQD: 30.0000 mL | Freq: Three times a day (TID) | ORAL | Status: DC

## 2011-11-25 MED ORDER — ADULT MULTIVITAMIN LIQUID CH: 5.0000 mL | Freq: Every day | ORAL | Status: DC

## 2011-11-25 MED ORDER — ASPIRIN 81 MG PO CHEW: 81.0000 mg | CHEWABLE_TABLET | Freq: Every day | ORAL | Status: DC

## 2011-11-25 MED ORDER — SODIUM CHLORIDE 0.9 % IV SOLN: 100.0000 mL | INTRAVENOUS | Status: DC

## 2011-11-25 MED ORDER — ALBUTEROL SULFATE (5 MG/ML) 0.5% IN NEBU: 2.5000 mg | INHALATION_SOLUTION | RESPIRATORY_TRACT | Status: DC | PRN

## 2011-11-25 MED ORDER — LABETALOL HCL 5 MG/ML IV SOLN: 10.0000 mg | INTRAVENOUS | Status: DC | PRN

## 2011-11-25 MED ORDER — ALBUTEROL SULFATE (5 MG/ML) 0.5% IN NEBU: 2.5000 mg | INHALATION_SOLUTION | Freq: Three times a day (TID) | RESPIRATORY_TRACT | Status: DC

## 2011-11-25 MED ORDER — FENTANYL CITRATE 0.05 MG/ML IJ SOLN: 25.0000 ug | INTRAMUSCULAR | Status: DC | PRN

## 2011-11-25 MED ORDER — HEPARIN SODIUM (PORCINE) 5000 UNIT/ML IJ SOLN: 5000.0000 [IU] | Freq: Three times a day (TID) | INTRAMUSCULAR | Status: DC

## 2011-11-25 NOTE — Discharge Summary (Signed)
Physician Discharge Summary  Patient ID: [name] MRN: [id number] DOB/AGE: Jul 29, 1940 71 y.o.  Admit date: 11/14/2011 Discharge date: 11/25/2011  Problem List Active Problems:  Type 2 diabetes mellitus with vascular disease  Hyperlipdemia  Obesity (BMI 30-39.9)  TOBACCO ABUSE  Sleep apnea  Hypertensive heart disease with congestive heart failure  CORONARY ARTERY DISEASE  Chronic cor pulmonale  ASTHMA  C O P D  Chronic kidney disease (CKD), stage IV (severe)  Acute systolic congestive heart failure  Acute respiratory failure with hypercapnia  Cardiomyopathy of undetermined type  History of gout  Bifascicular block  Other chronic pulmonary heart diseases  Acute on chronic renal failure  Anemia  Swelling of extremity, right  Acute on chronic systolic heart failure  HPI: 71 yo with multiple health problems - OHS/OSA, diastolic and systolic CHF, COPD, DM, HTN. Discharged 3 days prior to this admit from SNF. Presented with hypercarbic resp failure and required intubation 9/22. Was re-intubated on 9/30 after bronch due to small airway and subsequently trached on 10/1  Hospital Course:  Events Since Admission:  9/22 >>> intubated  9/26 >>> extubated  9/27 bscopy - thick stringy secretions, opened up left lung  9/30 >>> bronch, complete left main left collapse, intubated  10/1>>>tracheostomy  Physical Examination:  General: NAD  Neuro: alert and follows command  HEENT: cushingoid appearance, tracheostomy site clean  Neck: No jvd, no stridor  Cardiovascular: s1 s2 rrr  Lungs: diminished breath sounds on left side compared to right  Abdomen: obese +bs  Musculoskeletal: intact  Skin: Warm  Ext: trace pedal edema  ASSESSMENT AND PLAN  PULMONARY   Lab  11/22/11 1826   PHART  7.455*   PCO2ART  54.7*   PO2ART  283.0*   HCO3  38.5*   O2SAT  100.0    Ventilator Settings:  Vent Mode: [-] PRVC  FiO2 (%): [40 %] 40 %  Set Rate: [12 bmp] 12 bmp  Vt Set: [440 mL]  440 mL  PEEP: [5 cmH20] 5 cmH20  Pressure Support: [10 cmH20] 10 cmH20  Plateau Pressure: [15 cmH20-18 cmH20] 15 cmH20  CXR:  Chest Portable 1 View To Assess Tube Placement And Rule-out Pneumothorax  11/23/2011 *RADIOLOGY REPORT* Clinical Data: Tracheostomy PORTABLE CHEST - 1 VIEW Comparison: 11/23/2011 Findings: Tracheostomy 6.5 cm above the carina. NG tube enters the stomach with the tip not visualized, unchanged. Dense consolidation/airspace disease persist in the lingula and left lower lobe. Stable mild vascular congestion versus interstitial edema. No enlarging effusion or pneumothorax. IMPRESSION: Tracheostomy 6.5 cm above the carina. No pneumothorax. Stable left base consolidation/collapse. Original Report Authenticated By: Judie Petit. Ruel Favors, M.D.  Dg Chest Port 1 View  11/23/2011 *RADIOLOGY REPORT* Clinical Data: Assess endotracheal tube, atelectasis PORTABLE CHEST - 1 VIEW Comparison: Prior chest x-ray 11/22/2011 Findings: Unchanged position of endotracheal tube 2.5 cm superior to the carina. Incompletely imaged nasogastric tube, the tip lies below the diaphragm likely within the stomach. Slightly improved aeration of the right lung with decreasing interstitial edema. Unchanged dense left basilar opacity and right to left shift of the heart and mediastinal structures. Left basilar opacity most likely represents atelectasis given the underlying volume loss. IMPRESSION: 1. Improving interstitial pulmonary edema 2. Persistent left basilar opacity with volume loss as evidenced by right to left shift of the cardiac and mediastinal structures. This likely represents left lower lobe collapse. Superimposed effusion, and infection is difficult to exclude. 3. Stable patchy right lower lobe opacity which may represent atelectasis and / or infiltrate  4. Stable and satisfactory support apparatus Original Report Authenticated By: Alvino Blood Chest Port 1 View  11/22/2011 *RADIOLOGY REPORT* Clinical Data: Lung  collapse, post intubation PORTABLE CHEST - 1 VIEW Comparison: Earlier same day; 11/20/2011; 11/19/2011; 11/17/2011; 09/06/2011 Findings: Interval intubation with endotracheal tube overlying the tracheal air column with tip approximately 2.5 cm above the carina. No pneumothorax. Improved aeration of the right upper lung with persistent obscuration of the left heart border secondary to left basilar consolidative opacities. There is persistent left-sided volume loss with deviation of the cardiomediastinal structures to the left, possibly accentuated due to patient positioning. The pulmonary vasculature is less distinct on the present examination. Interval increase in right peri and infrahilar heterogeneous opacities. No definite right-sided pleural effusion. Unchanged bones. IMPRESSION: 1. Interval intubation with endotracheal tube overlying tracheal air column with tip superior to the carina. No pneumothorax. 2. Improved aeration of the left upper lung with persistent left mid and lower lung consolidative opacities and associated volume loss. Underlying infection is not excluded. 3. Findings suggestive of mild pulmonary edema with worsening right peri and infrahilar opacities, possibly atelectasis. Original Report Authenticated By: Waynard Reeds, M.D.  Dg Chest Port 1 View  11/22/2011 *RADIOLOGY REPORT* Clinical Data: 71 year old female - cough and congestion. PORTABLE CHEST - 1 VIEW Comparison: 11/20/2011 Findings: The left hemithorax is opacified compatible with very large pleural effusion or left lung consolidation / collapse. Mild right basilar atelectasis is noted. There is no evidence of pneumothorax or acute bony abnormality. IMPRESSION: Interval opacification left hemithorax, compatible with a very large pleural effusion or left lung consolidation/collapse. No evidence of pneumothorax. Right basilar atelectasis. Original Report Authenticated By: Rosendo Gros, M.D.  Dg Shoulder Left  11/22/2011 *RADIOLOGY  REPORT* Clinical Data: 71 year old female with left shoulder pain. LEFT SHOULDER - 2+ VIEW Comparison: Chest radiograph performed today. Findings: There is no evidence of acute fracture, subluxation or dislocation. The left hemithorax is opacified, compatible with very large pleural effusion or left lung consolidation/collapse. No focal bony lesions are identified. IMPRESSION: No evidence of acute abnormalities in the left shoulder. Opacified left hemithorax - question very large pleural effusion or left lung consolidation/collapse. Original Report Authenticated By: Rosendo Gros, M.D.   ETT: 9/22>> 9/26  ETT 9/30>>>  Tracheostomy 10/1>>>  A:  - VDRF in setting of OSA ,noncompliant w CPAP, diastolic and systolic CHF  - Extubated 9/26 and re-intubated 9/30 for airway protection. Tracheostomy 10/1 due to small airway  - 10/2 xray pending  P:  - continue current vent settings  - pcxr in am for monitoring of left lower lobe collapse  - transfer to step down for trach management  CARDIOVASCULAR   Lab  11/22/11 0432   TROPONINI  --   LATICACIDVEN  --   PROBNP  16044.0*    ECG:  Lines: peripheral IV  A:  - CAD./CHF/Bifasicular block  -echo 7/13 ef 30%, gr 3 diast dysfn  - troponin +, likely secondary to AE-CHF  - elevated pro BNP, trending down since admission  P:  - Continue coreg, ntg paste  - continue torsemide  - Hydralazine and labetolol prn given for HTN Consider starting po hydralazine  RENAL   Lab  11/24/11 0455  11/23/11 0443  11/22/11 0432  11/21/11 0440  11/20/11 0953  11/18/11 0325   NA  148*  151*  144  150*  149*  --   K  3.2*  3.8  --  --  --  --  CL  104  107  103  106  104  --   CO2  32  33*  34*  33*  34*  --   BUN  54*  50*  58*  59*  57*  --   CREATININE  1.90*  1.94*  2.37*  2.56*  2.46*  --   CALCIUM  9.5  9.0  9.0  9.1  9.2  --   MG  --  --  --  --  --  1.7   PHOS  --  --  --  --  --  3.4    Intake/Output  10/01 0701 - 10/02 0700 10/02 0701 - 10/03 0700    P.O.  I.V. (mL/kg) 401.4 (5)  NG/GT 760  IV Piggyback 0 100  Total Intake(mL/kg) 1161.4 (14.4) 100 (1.2)  Urine (mL/kg/hr) 1115 (0.6)  Total Output 1115  Net +46.4 +100   Foley: 9/22 >>  A:  - improving creatinine  - hypokalemic  P:  - followed by Dr Eliott Nine  - continue torsemide given stable creatinine  - hypernatremia treated with free water  - KCl IV x4 for hyponatremia  GASTROINTESTINAL   Lab  11/23/11 0443   AST  11   ALT  6   ALKPHOS  48   BILITOT  0.7   PROT  6.1   ALBUMIN  2.6*    A:  - NG tube: 9/30>>>  P:  - continue NG tube  - nutrition consult for tube feeds  - d/c protonix  HEMATOLOGIC   Lab  11/24/11 0455  11/23/11 0912  11/23/11 0443  11/22/11 0432  11/21/11 0440  11/20/11 0953   HGB  10.4*  --  9.9*  9.7*  9.5*  9.7*   HCT  34.1*  --  33.5*  34.0*  33.3*  34.0*   PLT  193  --  194  175  169  197   INR  --  1.09  --  --  --  --   APTT  --  24  --  --  --  --    A:  - Anemia: stable  - thrombocytopenia. ? Chronic , improved  P:  - cbc in am  -hep sub q tolerated  INFECTIOUS   Lab  11/24/11 0455  11/23/11 0443  11/22/11 0432  11/21/11 0440  11/20/11 0953   WBC  9.3  7.6  8.2  6.6  8.8   PROCALCITON  --  --  --  --  --    Cultures:  9/22 sputum>> normal flora  9/22 Ucx>> candida  9/22 Bcx >> 1 of 2 bottles GPC >> micrococcus sp  9/27 tracheal aspirate >> normal flora  Antibiotics:  Vanco 9/23 >> 9/25  A:  - tracheal aspirate: no growth  - GPC on 1 of 2 cx, micrococcus. Likely contaminant  - no current Abx  - WBC normal  P:  - continue monitoring for any fevers.  ENDOCRINE   Lab  11/24/11 0401  11/24/11 0007  11/23/11 1950  11/23/11 1606  11/23/11 1155   GLUCAP  123*  128*  125*  121*  112*    A:  - DM  - Glucose currently controlled  P:  - SSI protocol  NEUROLOGIC  A:  - agitation overnight prompted starting versed and fentanyl. Now following commands and alert  P:  - d/c sedation        Labs at discharge Lab  Results  Component  Value Date   CREATININE 1.93* 11/25/2011   BUN 65* 11/25/2011   NA 145 11/25/2011   K 3.4* 11/25/2011   CL 105 11/25/2011   CO2 33* 11/25/2011   Lab Results  Component Value Date   WBC 9.3 11/24/2011   HGB 10.4* 11/24/2011   HCT 34.1* 11/24/2011   MCV 82.8 11/24/2011   PLT 193 11/24/2011   Lab Results  Component Value Date   ALT 6 11/23/2011   AST 11 11/23/2011   ALKPHOS 48 11/23/2011   BILITOT 0.7 11/23/2011   Lab Results  Component Value Date   INR 1.09 11/23/2011   INR 1.04 11/14/2011   INR 0.94 09/12/2011    Current radiology studies Dg Chest Port 1 View  11/25/2011  *RADIOLOGY REPORT*  Clinical Data: Left lower lobe lung collapse.  PORTABLE CHEST - 1 VIEW  Comparison: November 24, 2011.  Findings: Stable cardiomegaly.  Tracheostomy tube unchanged in position.  Nasogastric tube seen entering stomach. Right lung is clear.  Left basilar opacity unchanged compared to prior exam. Interval development of left upper lobe opacity concerning for subsegmental atelectasis or possibly pneumonia.  IMPRESSION: Interval development of left upper lobe opacity as described above.   Original Report Authenticated By: Venita Sheffield., M.D.    Dg Chest Port 1 View  11/24/2011  *RADIOLOGY REPORT*  Clinical Data: Left lower lobe collapse.  Check endotracheal tube position.  PORTABLE CHEST - 1 VIEW  Comparison: One-view chest 11/23/2011.  Findings: Cardiac enlargement is stable.  There is persistent collapse or airspace disease in the left lower lobe.  A left pleural effusion is present.  The tracheostomy tube is stable.  The NG tube courses off the inferior border the film.  Mild pulmonary vascular congestion is present.  The right lung is otherwise clear.  IMPRESSION:  1.  Persistent left lower lobe airspace disease compatible with collapse or infection. 2.  Left pleural effusion. 3.  Stable cardiomegaly and pulmonary vascular congestion.   Original Report Authenticated By: Jamesetta Orleans.  MATTERN, M.D.    Chest Portable 1 View To Assess Tube Placement And Rule-out Pneumothorax  11/23/2011  *RADIOLOGY REPORT*  Clinical Data: Tracheostomy  PORTABLE CHEST - 1 VIEW  Comparison: 11/23/2011  Findings: Tracheostomy 6.5 cm above the carina.  NG tube enters the stomach with the tip not visualized, unchanged.  Dense consolidation/airspace disease persist in the lingula and left lower lobe.  Stable mild vascular congestion versus interstitial edema.  No enlarging effusion or pneumothorax.  IMPRESSION: Tracheostomy 6.5 cm above the carina.  No pneumothorax.  Stable left base consolidation/collapse.   Original Report Authenticated By: Judie Petit. Ruel Favors, M.D.     Disposition:  03-Skilled Nursing Facility  Discharge Orders    Future Orders Please Complete By Expires   Discharge to SNF when bed available          Medication List     As of 11/25/2011  9:44 AM    STOP taking these medications         insulin aspart 100 UNIT/ML injection   Commonly known as: novoLOG      ipratropium 0.02 % nebulizer solution   Commonly known as: ATROVENT      isosorbide mononitrate 120 MG 24 hr tablet   Commonly known as: IMDUR      nicotine 21 mg/24hr patch   Commonly known as: NICODERM CQ - dosed in mg/24 hours      paricalcitol 1 MCG capsule   Commonly known as: ZEMPLAR  TAKE these medications         albuterol (5 MG/ML) 0.5% nebulizer solution   Commonly known as: PROVENTIL   Take 0.5 mLs (2.5 mg total) by nebulization 3 (three) times daily.      albuterol (5 MG/ML) 0.5% nebulizer solution   Commonly known as: PROVENTIL   Take 0.5 mLs (2.5 mg total) by nebulization every 4 (four) hours as needed for wheezing or shortness of breath.      aspirin 81 MG chewable tablet   Place 1 tablet (81 mg total) into feeding tube daily.      aspirin EC 81 MG tablet   Take 81 mg by mouth daily.      atorvastatin 10 MG tablet   Commonly known as: LIPITOR   Take 10 mg by mouth every evening.        carvedilol 12.5 MG tablet   Commonly known as: COREG   Take 12.5 mg by mouth 2 (two) times daily with a meal.      feeding supplement (PROMOTE) Liqd   Place 1,000 mLs into feeding tube daily.      feeding supplement Liqd   Place 30 mLs into feeding tube 3 (three) times daily.      fentaNYL 0.05 MG/ML injection   Commonly known as: SUBLIMAZE   Inject 0.5-1 mLs (25-50 mcg total) into the vein every 2 (two) hours as needed for severe pain.      free water Soln   Place 200 mLs into feeding tube every 8 (eight) hours.      heparin 5000 UNIT/ML injection   Inject 1 mL (5,000 Units total) into the skin every 8 (eight) hours.      hydrALAZINE 25 MG tablet   Commonly known as: APRESOLINE   Take 3 tablets (75 mg total) by mouth every 8 (eight) hours.      labetalol 5 MG/ML injection   Commonly known as: NORMODYNE,TRANDATE   Inject 2 mLs (10 mg total) into the vein every 2 (two) hours as needed (Until SBP < 170 mmHg).      multivitamin Liqd   Place 5 mLs into feeding tube daily.      nitroGLYCERIN 2 % ointment   Commonly known as: NITROGLYN   Place 1 inch onto the skin every 6 (six) hours.      sodium chloride 0.9 % infusion   Inject 100 mLs into the vein continuous.      torsemide 20 MG tablet   Commonly known as: DEMADEX   Take 1 tablet (20 mg total) by mouth daily.           Follow-up Information    Please follow up. (per select)          Discharged Condition: fair Vital signs at Discharge. Temp:  [98.5 F (36.9 C)-99 F (37.2 C)] 98.7 F (37.1 C) (10/03 0734) Pulse Rate:  [66-84] 66  (10/03 0904) Resp:  [12-31] 12  (10/03 0904) BP: (139-172)/(53-81) 139/66 mmHg (10/03 0904) SpO2:  [88 %-97 %] 96 % (10/03 0904) FiO2 (%):  [40 %-60 %] 50 % (10/03 0904) Weight:  [82.6 kg (182 lb 1.6 oz)-85.458 kg (188 lb 6.4 oz)] 85.458 kg (188 lb 6.4 oz) (10/03 0000)  Special Information or instructions. Per [facility] Signed: Brett Canales Minor ACNP Adolph Pollack PCCM Pager [phone] till  3 pm If no answer page [phone] 11/25/2011, 9:44 AM  Patient seen and examined, agree with above note, patient [facility].  Alyson Reedy, M.D. [facility] Pulmonary/Critical Care [phone]

## 2011-11-25 NOTE — Progress Notes (Signed)
Report called to [place], [place] RN. Tried to call family, left message to call me back. Pt to transfer to [id number], belongings at bedside. No current questions or complaints. VS stable at time of transfer. Orel Hord L

## 2011-11-25 NOTE — Progress Notes (Signed)
Pt son Ethelene Browns) and sister in law Lupita Leash) calling for updates seemingly very agitated. Pt said no info to these family members. Draeden Kellman L

## 2011-11-25 NOTE — Progress Notes (Signed)
PT Cancel Note:  PT session cancelled due to pt transferring to [facility].   Verdell Face, [place] [phone] 11/25/2011

## 2011-11-25 NOTE — Progress Notes (Signed)
SLP Cancellation Note  Treatment cancelled today due to transfer to [facility].  Verified PMSV was in belongings to go with pt.  RN aware. Chandon Lazcano B. Hart, [facility], CCC-SLP [phone]   Leigh Aurora 11/25/2011, 4:06 PM

## 2011-11-26 ENCOUNTER — Other Ambulatory Visit (HOSPITAL_COMMUNITY): Payer: Self-pay

## 2011-11-26 LAB — HEMOGLOBIN A1C
Hgb A1c MFr Bld: 4.2 % (ref <5.7)
Mean Plasma Glucose: 74 mg/dL (ref <117)

## 2011-11-26 LAB — URINALYSIS, ROUTINE W REFLEX MICROSCOPIC
Ketones, ur: NEGATIVE mg/dL
Specific Gravity, Urine: 1.017 (ref 1.005–1.030)
pH: 7.5 (ref 5.0–8.0)

## 2011-11-26 LAB — COMPREHENSIVE METABOLIC PANEL
Albumin: 2.3 g/dL — ABNORMAL LOW (ref 3.5–5.2)
Alkaline Phosphatase: 52 U/L (ref 39–117)
BUN: 62 mg/dL — ABNORMAL HIGH (ref 6–23)
Creatinine, Ser: 1.81 mg/dL — ABNORMAL HIGH (ref 0.50–1.10)
GFR calc Af Amer: 31 mL/min — ABNORMAL LOW (ref >90)
Glucose, Bld: 118 mg/dL — ABNORMAL HIGH (ref 70–99)
Potassium: 2.8 mEq/L — ABNORMAL LOW (ref 3.5–5.1)
Total Protein: 5.6 g/dL — ABNORMAL LOW (ref 6.0–8.3)

## 2011-11-26 LAB — URINE MICROSCOPIC-ADD ON

## 2011-11-26 LAB — CBC
Hemoglobin: 10.2 g/dL — ABNORMAL LOW (ref 12.0–15.0)
MCH: 25.2 pg — ABNORMAL LOW (ref 26.0–34.0)
Platelets: 228 10*3/uL (ref 150–400)
RBC: 4.05 MIL/uL (ref 3.87–5.11)
WBC: 12.4 10*3/uL — ABNORMAL HIGH (ref 4.0–10.5)

## 2011-11-26 LAB — MAGNESIUM: Magnesium: 1.6 mg/dL (ref 1.5–2.5)

## 2011-11-26 LAB — TSH: TSH: 0.629 u[IU]/mL (ref 0.350–4.500)

## 2011-11-26 LAB — PHOSPHORUS: Phosphorus: 2.5 mg/dL (ref 2.3–4.6)

## 2011-11-26 LAB — PREALBUMIN
Prealbumin: 12.8 mg/dL — ABNORMAL LOW (ref 17.0–34.0)
Prealbumin: 12.4 mg/dL — ABNORMAL LOW (ref 17.0–34.0)

## 2011-11-27 ENCOUNTER — Other Ambulatory Visit (HOSPITAL_COMMUNITY): Payer: Self-pay

## 2011-11-27 LAB — BASIC METABOLIC PANEL
Calcium: 9.2 mg/dL (ref 8.4–10.5)
GFR calc Af Amer: 33 mL/min — ABNORMAL LOW (ref >90)
GFR calc non Af Amer: 28 mL/min — ABNORMAL LOW (ref >90)
Potassium: 4.4 mEq/L (ref 3.5–5.1)
Sodium: 146 mEq/L — ABNORMAL HIGH (ref 135–145)

## 2011-11-27 LAB — CULTURE, RESPIRATORY W GRAM STAIN

## 2011-11-30 ENCOUNTER — Other Ambulatory Visit (HOSPITAL_COMMUNITY): Payer: Self-pay

## 2011-11-30 LAB — CBC
HCT: 36.8 % (ref 36.0–46.0)
Hemoglobin: 11.1 g/dL — ABNORMAL LOW (ref 12.0–15.0)
RBC: 4.37 MIL/uL (ref 3.87–5.11)
WBC: 13.4 10*3/uL — ABNORMAL HIGH (ref 4.0–10.5)

## 2011-11-30 LAB — BASIC METABOLIC PANEL
BUN: 79 mg/dL — ABNORMAL HIGH (ref 6–23)
Chloride: 107 mEq/L (ref 96–112)
Glucose, Bld: 133 mg/dL — ABNORMAL HIGH (ref 70–99)
Potassium: 3.5 mEq/L (ref 3.5–5.1)

## 2011-12-01 ENCOUNTER — Other Ambulatory Visit (HOSPITAL_COMMUNITY): Payer: Self-pay

## 2011-12-01 LAB — BASIC METABOLIC PANEL
BUN: 79 mg/dL — ABNORMAL HIGH (ref 6–23)
Chloride: 106 mEq/L (ref 96–112)
GFR calc Af Amer: 33 mL/min — ABNORMAL LOW (ref >90)
Glucose, Bld: 123 mg/dL — ABNORMAL HIGH (ref 70–99)
Potassium: 3.8 mEq/L (ref 3.5–5.1)

## 2011-12-03 LAB — BASIC METABOLIC PANEL
CO2: 35 mEq/L — ABNORMAL HIGH (ref 19–32)
Calcium: 9.7 mg/dL (ref 8.4–10.5)
Chloride: 106 mEq/L (ref 96–112)
Glucose, Bld: 120 mg/dL — ABNORMAL HIGH (ref 70–99)
Sodium: 149 mEq/L — ABNORMAL HIGH (ref 135–145)

## 2011-12-03 LAB — CBC
Hemoglobin: 10 g/dL — ABNORMAL LOW (ref 12.0–15.0)
MCH: 25.1 pg — ABNORMAL LOW (ref 26.0–34.0)
RBC: 3.98 MIL/uL (ref 3.87–5.11)
WBC: 13.7 10*3/uL — ABNORMAL HIGH (ref 4.0–10.5)

## 2011-12-04 LAB — BASIC METABOLIC PANEL
BUN: 87 mg/dL — ABNORMAL HIGH (ref 6–23)
CO2: 32 mEq/L (ref 19–32)
Chloride: 105 mEq/L (ref 96–112)
GFR calc non Af Amer: 24 mL/min — ABNORMAL LOW (ref >90)
Glucose, Bld: 125 mg/dL — ABNORMAL HIGH (ref 70–99)
Potassium: 3.7 mEq/L (ref 3.5–5.1)

## 2011-12-06 LAB — BASIC METABOLIC PANEL
BUN: 76 mg/dL — ABNORMAL HIGH (ref 6–23)
CO2: 35 mEq/L — ABNORMAL HIGH (ref 19–32)
GFR calc non Af Amer: 22 mL/min — ABNORMAL LOW (ref >90)
Glucose, Bld: 113 mg/dL — ABNORMAL HIGH (ref 70–99)
Potassium: 3.6 mEq/L (ref 3.5–5.1)

## 2011-12-06 LAB — CBC
HCT: 34.3 % — ABNORMAL LOW (ref 36.0–46.0)
Hemoglobin: 10 g/dL — ABNORMAL LOW (ref 12.0–15.0)
MCHC: 29.2 g/dL — ABNORMAL LOW (ref 30.0–36.0)

## 2011-12-07 LAB — RENAL FUNCTION PANEL
BUN: 77 mg/dL — ABNORMAL HIGH (ref 6–23)
CO2: 34 mEq/L — ABNORMAL HIGH (ref 19–32)
Chloride: 104 mEq/L (ref 96–112)
Glucose, Bld: 112 mg/dL — ABNORMAL HIGH (ref 70–99)
Phosphorus: 3.5 mg/dL (ref 2.3–4.6)
Potassium: 4 mEq/L (ref 3.5–5.1)

## 2011-12-07 LAB — CBC
HCT: 34.1 % — ABNORMAL LOW (ref 36.0–46.0)
Hemoglobin: 10 g/dL — ABNORMAL LOW (ref 12.0–15.0)
MCV: 86.3 fL (ref 78.0–100.0)
RBC: 3.95 MIL/uL (ref 3.87–5.11)
WBC: 13.8 10*3/uL — ABNORMAL HIGH (ref 4.0–10.5)

## 2011-12-08 LAB — CBC
HCT: 32.8 % — ABNORMAL LOW (ref 36.0–46.0)
Hemoglobin: 9.6 g/dL — ABNORMAL LOW (ref 12.0–15.0)
MCH: 25.2 pg — ABNORMAL LOW (ref 26.0–34.0)
MCHC: 29.3 g/dL — ABNORMAL LOW (ref 30.0–36.0)
MCV: 86.1 fL (ref 78.0–100.0)
RBC: 3.81 MIL/uL — ABNORMAL LOW (ref 3.87–5.11)

## 2011-12-08 LAB — RENAL FUNCTION PANEL
BUN: 90 mg/dL — ABNORMAL HIGH (ref 6–23)
CO2: 33 mEq/L — ABNORMAL HIGH (ref 19–32)
Calcium: 9.2 mg/dL (ref 8.4–10.5)
Glucose, Bld: 69 mg/dL — ABNORMAL LOW (ref 70–99)
Phosphorus: 4.3 mg/dL (ref 2.3–4.6)

## 2011-12-11 LAB — BASIC METABOLIC PANEL
CO2: 31 mEq/L (ref 19–32)
Calcium: 8.8 mg/dL (ref 8.4–10.5)
Chloride: 98 mEq/L (ref 96–112)
Creatinine, Ser: 1.74 mg/dL — ABNORMAL HIGH (ref 0.50–1.10)
Glucose, Bld: 115 mg/dL — ABNORMAL HIGH (ref 70–99)

## 2011-12-11 LAB — ABO/RH: ABO/RH(D): A POS

## 2011-12-11 LAB — OCCULT BLOOD X 1 CARD TO LAB, STOOL: Fecal Occult Bld: POSITIVE

## 2011-12-11 LAB — MAGNESIUM: Magnesium: 2.5 mg/dL (ref 1.5–2.5)

## 2011-12-11 LAB — HEMOGLOBIN AND HEMATOCRIT, BLOOD: HCT: 32.2 % — ABNORMAL LOW (ref 36.0–46.0)

## 2011-12-11 LAB — PREPARE RBC (CROSSMATCH)

## 2011-12-12 LAB — CBC WITH DIFFERENTIAL/PLATELET
Eosinophils Absolute: 0.1 10*3/uL (ref 0.0–0.7)
Eosinophils Relative: 1 % (ref 0–5)
HCT: 29 % — ABNORMAL LOW (ref 36.0–46.0)
Hemoglobin: 8.9 g/dL — ABNORMAL LOW (ref 12.0–15.0)
Lymphocytes Relative: 13 % (ref 12–46)
Lymphs Abs: 1.4 10*3/uL (ref 0.7–4.0)
MCH: 25.3 pg — ABNORMAL LOW (ref 26.0–34.0)
MCV: 82.4 fL (ref 78.0–100.0)
Monocytes Relative: 8 % (ref 3–12)
RBC: 3.52 MIL/uL — ABNORMAL LOW (ref 3.87–5.11)
WBC: 10.8 10*3/uL — ABNORMAL HIGH (ref 4.0–10.5)

## 2011-12-12 LAB — BASIC METABOLIC PANEL
CO2: 32 mEq/L (ref 19–32)
Chloride: 95 mEq/L — ABNORMAL LOW (ref 96–112)
Glucose, Bld: 144 mg/dL — ABNORMAL HIGH (ref 70–99)
Potassium: 3.6 mEq/L (ref 3.5–5.1)
Sodium: 132 mEq/L — ABNORMAL LOW (ref 135–145)

## 2011-12-13 ENCOUNTER — Encounter: Payer: Self-pay | Admitting: Radiology

## 2011-12-13 LAB — PROTIME-INR: INR: 1.08 (ref 0.00–1.49)

## 2011-12-13 LAB — BASIC METABOLIC PANEL
BUN: 52 mg/dL — ABNORMAL HIGH (ref 6–23)
Chloride: 95 mEq/L — ABNORMAL LOW (ref 96–112)
GFR calc Af Amer: 36 mL/min — ABNORMAL LOW (ref >90)
GFR calc non Af Amer: 31 mL/min — ABNORMAL LOW (ref >90)
Glucose, Bld: 92 mg/dL (ref 70–99)
Potassium: 3.9 mEq/L (ref 3.5–5.1)
Sodium: 133 mEq/L — ABNORMAL LOW (ref 135–145)

## 2011-12-13 LAB — APTT: aPTT: 34 seconds (ref 24–37)

## 2011-12-13 LAB — HEMOGLOBIN AND HEMATOCRIT, BLOOD: HCT: 30 % — ABNORMAL LOW (ref 36.0–46.0)

## 2011-12-13 LAB — MAGNESIUM: Magnesium: 2.2 mg/dL (ref 1.5–2.5)

## 2011-12-13 NOTE — H&P (Signed)
[name] is an 71 y.o. female.   Chief Complaint: CVA - dysphagia; + trach; resp failure; deconditioning Scheduled for percutaneous gastric tube in IR 10/22 HPI: COPD; CKD; DM; HTN; CVA; HLD; obese; CHF  Past Medical History  Diagnosis Date  . COPD (chronic obstructive pulmonary disease)     on home o2  . Chronic renal insufficiency   . Type II or unspecified type diabetes mellitus without mention of complication, not stated as uncontrolled   . CVA (cerebral infarction)     hx  . Hypertensive heart disease with congestive heart failure   . Chronic cor pulmonale   . Cardiomyopathy of undetermined type 09/07/2011  . Chronic kidney disease (CKD), stage IV (severe)   . Hyperlipdemia   . Obesity (BMI 30-39.9)   . Sleep apnea   . History of gout 09/07/2011  . Heart failure 7/13  . Shortness of breath   . Anemia   . Vascular disease   . Asthma     Past Surgical History  Procedure Date  . Bilateral oophorectomy   . Cardiac catheterization     right heart cath    Family History  Problem Relation Age of Onset  . Heart attack Father   . Stroke Mother     CVA  . Coronary artery disease Daughter    Social History:  reports that she quit smoking about 3 months ago. Her smoking use included Cigarettes. She has a 55 pack-year smoking history. She has never used smokeless tobacco. She reports that she does not drink alcohol or use illicit drugs.  Allergies:  Allergies  Allergen Reactions  . Sulfonamide Derivatives Hives and Rash    Medications Prior to Admission  Medication Sig Dispense Refill  . albuterol (PROVENTIL) (5 MG/ML) 0.5% nebulizer solution Take 0.5 mLs (2.5 mg total) by nebulization 3 (three) times daily.  20 mL  0  . albuterol (PROVENTIL) (5 MG/ML) 0.5% nebulizer solution Take 0.5 mLs (2.5 mg total) by nebulization every 4 (four) hours as needed for wheezing or shortness of breath.  20 mL    . aspirin 81 MG chewable tablet Place 1 tablet (81 mg total) into  feeding tube daily.      Marland Kitchen aspirin EC 81 MG tablet Take 81 mg by mouth daily.      Marland Kitchen atorvastatin (LIPITOR) 10 MG tablet Take 10 mg by mouth every evening.       . carvedilol (COREG) 12.5 MG tablet Take 12.5 mg by mouth 2 (two) times daily with a meal.      . feeding supplement (PRO-STAT SUGAR FREE 64) LIQD Place 30 mLs into feeding tube 3 (three) times daily.  900 mL    . fentaNYL (SUBLIMAZE) 0.05 MG/ML injection Inject 0.5-1 mLs (25-50 mcg total) into the vein every 2 (two) hours as needed for severe pain.  2 mL    . heparin 5000 UNIT/ML injection Inject 1 mL (5,000 Units total) into the skin every 8 (eight) hours.  1 mL    . hydrALAZINE (APRESOLINE) 25 MG tablet Take 3 tablets (75 mg total) by mouth every 8 (eight) hours.  90 tablet  6  . labetalol (NORMODYNE,TRANDATE) 5 MG/ML injection Inject 2 mLs (10 mg total) into the vein every 2 (two) hours as needed (Until SBP < 170 mmHg).  20 mL    . Multiple Vitamin (MULTIVITAMIN) LIQD Place 5 mLs into feeding tube daily.      . nitroGLYCERIN (NITROGLYN) 2 % ointment Place 1 inch  onto the skin every 6 (six) hours.  30 g    . Nutritional Supplements (FEEDING SUPPLEMENT, PROMOTE,) LIQD Place 1,000 mLs into feeding tube daily.      . sodium chloride 0.9 % infusion Inject 100 mLs into the vein continuous.      . torsemide (DEMADEX) 20 MG tablet Take 1 tablet (20 mg total) by mouth daily.  60 tablet  3  . Water For Irrigation, Sterile (FREE WATER) SOLN Place 200 mLs into feeding tube every 8 (eight) hours.        Results for orders placed during the hospital encounter of 11/25/11 (from the past 48 hour(s))  HEMOGLOBIN AND HEMATOCRIT, BLOOD     Status: Abnormal   Collection Time   12/11/11  3:27 PM      Component Value Range Comment   Hemoglobin 9.9 (*) 12.0 - 15.0 g/dL    HCT 16.1 (*) 09.6 - 46.0 %   TYPE AND SCREEN     Status: Normal (Preliminary result)   Collection Time   12/11/11  4:30 PM      Component Value Range Comment   ABO/RH(D) A POS        Antibody Screen NEG      Sample Expiration 12/14/2011      Unit Number [id number]      Blood Component Type RED CELLS,LR      Unit division 00      Status of Unit ALLOCATED      Transfusion Status OK TO TRANSFUSE      Crossmatch Result Compatible      Unit Number [id number]      Blood Component Type RED CELLS,LR      Unit division 00      Status of Unit ALLOCATED      Transfusion Status OK TO TRANSFUSE      Crossmatch Result Compatible     PREPARE RBC (CROSSMATCH)     Status: Normal   Collection Time   12/11/11  4:30 PM      Component Value Range Comment   Order Confirmation ORDER PROCESSED BY BLOOD BANK     ABO/RH     Status: Normal   Collection Time   12/11/11  4:30 PM      Component Value Range Comment   ABO/RH(D) A POS     BASIC METABOLIC PANEL     Status: Abnormal   Collection Time   12/12/11  4:45 AM      Component Value Range Comment   Sodium 132 (*) 135 - 145 mEq/L    Potassium 3.6  3.5 - 5.1 mEq/L    Chloride 95 (*) 96 - 112 mEq/L    CO2 32  19 - 32 mEq/L    Glucose, Bld 144 (*) 70 - 99 mg/dL    BUN 62 (*) 6 - 23 mg/dL    Creatinine, Ser 8.46 (*) 0.50 - 1.10 mg/dL    Calcium 8.7  8.4 - 96.2 mg/dL    GFR calc non Af Amer 32 (*) >90 mL/min    GFR calc Af Amer 37 (*) >90 mL/min   CBC WITH DIFFERENTIAL     Status: Abnormal   Collection Time   12/12/11  4:45 AM      Component Value Range Comment   WBC 10.8 (*) 4.0 - 10.5 K/uL    RBC 3.52 (*) 3.87 - 5.11 MIL/uL    Hemoglobin 8.9 (*) 12.0 - 15.0 g/dL    HCT 95.2 (*) 84.1 -  46.0 %    MCV 82.4  78.0 - 100.0 fL    MCH 25.3 (*) 26.0 - 34.0 pg    MCHC 30.7  30.0 - 36.0 g/dL    RDW 16.1 (*) 09.6 - 15.5 %    Platelets 210  150 - 400 K/uL    Neutrophils Relative 79 (*) 43 - 77 %    Neutro Abs 8.5 (*) 1.7 - 7.7 K/uL    Lymphocytes Relative 13  12 - 46 %    Lymphs Abs 1.4  0.7 - 4.0 K/uL    Monocytes Relative 8  3 - 12 %    Monocytes Absolute 0.8  0.1 - 1.0 K/uL    Eosinophils Relative 1  0 - 5 %     Eosinophils Absolute 0.1  0.0 - 0.7 K/uL    Basophils Relative 0  0 - 1 %    Basophils Absolute 0.0  0.0 - 0.1 K/uL   HEMOGLOBIN AND HEMATOCRIT, BLOOD     Status: Abnormal   Collection Time   12/12/11  3:28 PM      Component Value Range Comment   Hemoglobin 9.3 (*) 12.0 - 15.0 g/dL    HCT 04.5 (*) 40.9 - 46.0 %   BASIC METABOLIC PANEL     Status: Abnormal   Collection Time   12/13/11  6:44 AM      Component Value Range Comment   Sodium 133 (*) 135 - 145 mEq/L    Potassium 3.9  3.5 - 5.1 mEq/L    Chloride 95 (*) 96 - 112 mEq/L    CO2 33 (*) 19 - 32 mEq/L    Glucose, Bld 92  70 - 99 mg/dL    BUN 52 (*) 6 - 23 mg/dL    Creatinine, Ser 8.11 (*) 0.50 - 1.10 mg/dL    Calcium 9.1  8.4 - 91.4 mg/dL    GFR calc non Af Amer 31 (*) >90 mL/min    GFR calc Af Amer 36 (*) >90 mL/min   HEMOGLOBIN AND HEMATOCRIT, BLOOD     Status: Abnormal   Collection Time   12/13/11  6:44 AM      Component Value Range Comment   Hemoglobin 9.3 (*) 12.0 - 15.0 g/dL    HCT 78.2 (*) 95.6 - 46.0 %   MAGNESIUM     Status: Normal   Collection Time   12/13/11  6:44 AM      Component Value Range Comment   Magnesium 2.2  1.5 - 2.5 mg/dL    No results found.  Review of Systems  Constitutional: Positive for weight loss. Negative for fever.  Respiratory: Positive for sputum production.   Gastrointestinal: Negative for nausea and vomiting.  Genitourinary:       CKD  Neurological: Positive for weakness.  Psychiatric/Behavioral: Positive for memory loss.    There were no vitals taken for this visit. Physical Exam  Constitutional: She appears well-developed.  Cardiovascular: Normal rate and regular rhythm.   Respiratory: Effort normal. She has wheezes.       + trach  GI: Soft. Bowel sounds are normal. There is no tenderness.  Musculoskeletal: Normal range of motion.       Follows some commands; can move all 4s  Skin: Skin is warm and dry.     Assessment/Plan CVA; dysphagia; resp  failure Deconditioning Scheduled now for Perc g tube in IR 10/22 Existing NG; order for barium tonight Orders in for PT/PTT in am Ancef on call  to xray Need family to consent  [name] A 12/13/2011, 1:56 PM

## 2011-12-14 ENCOUNTER — Other Ambulatory Visit (HOSPITAL_COMMUNITY): Payer: Self-pay

## 2011-12-14 NOTE — ED Notes (Signed)
Bowel is obstructing stomach per Dr Elmon Kirschner, procedure cancelled

## 2011-12-14 NOTE — Procedures (Signed)
Cancelled percutaneous placement of gastrotomy tube secondary to lack of window due to overlying colon.  No procedure performed.

## 2011-12-15 ENCOUNTER — Encounter: Payer: Self-pay | Admitting: General Surgery

## 2011-12-15 DIAGNOSIS — R633 Feeding difficulties: Secondary | ICD-10-CM

## 2011-12-15 DIAGNOSIS — J9610 Chronic respiratory failure, unspecified whether with hypoxia or hypercapnia: Secondary | ICD-10-CM

## 2011-12-15 LAB — BASIC METABOLIC PANEL
Chloride: 101 mEq/L (ref 96–112)
GFR calc Af Amer: 34 mL/min — ABNORMAL LOW (ref >90)
Potassium: 4.3 mEq/L (ref 3.5–5.1)

## 2011-12-15 LAB — TYPE AND SCREEN
Antibody Screen: NEGATIVE
Unit division: 0

## 2011-12-15 LAB — CBC
HCT: 29 % — ABNORMAL LOW (ref 36.0–46.0)
Platelets: 241 10*3/uL (ref 150–400)
RBC: 3.54 MIL/uL — ABNORMAL LOW (ref 3.87–5.11)
RDW: 17.6 % — ABNORMAL HIGH (ref 11.5–15.5)
WBC: 11.5 10*3/uL — ABNORMAL HIGH (ref 4.0–10.5)

## 2011-12-15 NOTE — Consult Note (Signed)
[name] 30-Sep-1940  [id number].   Requesting MD: Dr. Guadelupe Sabin Chief Complaint/Reason for Consult: placement of open g tube HPI: This is a 71 yo female who is trached but not ventilated.  She is currently unable to speak and unable to provide any history for me.  It appears from her chart she was admitted for respiratory failure and got intubated and then extubated and required reintubation.  She was then transferred to [facility] for further management after getting trached.  She currently has a PANDA in for feeding access.  It is suspected that the patient will not be able to have her trach removed anytime soon and we have been requested to place an open g tube since a PEG is unable to be completed.  Review of Systems: unable to obtain  Family History  Problem Relation Age of Onset  . Heart attack Father   . Stroke Mother     CVA  . Coronary artery disease Daughter     Past Medical History  Diagnosis Date  . COPD (chronic obstructive pulmonary disease)     on home o2  . Chronic renal insufficiency   . Type II or unspecified type diabetes mellitus without mention of complication, not stated as uncontrolled   . CVA (cerebral infarction)     hx  . Hypertensive heart disease with congestive heart failure   . Chronic cor pulmonale   . Cardiomyopathy of undetermined type 09/07/2011  . Chronic kidney disease (CKD), stage IV (severe)   . Hyperlipdemia   . Obesity (BMI 30-39.9)   . Sleep apnea   . History of gout 09/07/2011  . Heart failure 7/13  . Shortness of breath   . Anemia   . Vascular disease   . Asthma     Past Surgical History  Procedure Date  . Bilateral oophorectomy   . Cardiac catheterization     right heart cath    Social History:  reports that she quit smoking about 3 months ago. Her smoking use included Cigarettes. She has a 55 pack-year smoking history. She has never used smokeless tobacco. She reports that she does not drink alcohol or use illicit  drugs.  Allergies:  Allergies  Allergen Reactions  . Sulfonamide Derivatives Hives and Rash    Medications Prior to Admission  Medication Sig Dispense Refill  . albuterol (PROVENTIL) (5 MG/ML) 0.5% nebulizer solution Take 0.5 mLs (2.5 mg total) by nebulization 3 (three) times daily.  20 mL  0  . albuterol (PROVENTIL) (5 MG/ML) 0.5% nebulizer solution Take 0.5 mLs (2.5 mg total) by nebulization every 4 (four) hours as needed for wheezing or shortness of breath.  20 mL    . aspirin 81 MG chewable tablet Place 1 tablet (81 mg total) into feeding tube daily.      Marland Kitchen aspirin EC 81 MG tablet Take 81 mg by mouth daily.      Marland Kitchen atorvastatin (LIPITOR) 10 MG tablet Take 10 mg by mouth every evening.       . carvedilol (COREG) 12.5 MG tablet Take 12.5 mg by mouth 2 (two) times daily with a meal.      . feeding supplement (PRO-STAT SUGAR FREE 64) LIQD Place 30 mLs into feeding tube 3 (three) times daily.  900 mL    . fentaNYL (SUBLIMAZE) 0.05 MG/ML injection Inject 0.5-1 mLs (25-50 mcg total) into the vein every 2 (two) hours as needed for severe pain.  2 mL    . heparin 5000 UNIT/ML injection  Inject 1 mL (5,000 Units total) into the skin every 8 (eight) hours.  1 mL    . hydrALAZINE (APRESOLINE) 25 MG tablet Take 3 tablets (75 mg total) by mouth every 8 (eight) hours.  90 tablet  6  . labetalol (NORMODYNE,TRANDATE) 5 MG/ML injection Inject 2 mLs (10 mg total) into the vein every 2 (two) hours as needed (Until SBP < 170 mmHg).  20 mL    . Multiple Vitamin (MULTIVITAMIN) LIQD Place 5 mLs into feeding tube daily.      . nitroGLYCERIN (NITROGLYN) 2 % ointment Place 1 inch onto the skin every 6 (six) hours.  30 g    . Nutritional Supplements (FEEDING SUPPLEMENT, PROMOTE,) LIQD Place 1,000 mLs into feeding tube daily.      . sodium chloride 0.9 % infusion Inject 100 mLs into the vein continuous.      . torsemide (DEMADEX) 20 MG tablet Take 1 tablet (20 mg total) by mouth daily.  60 tablet  3  . Water For  Irrigation, Sterile (FREE WATER) SOLN Place 200 mLs into feeding tube every 8 (eight) hours.        Blood pressure 133/56, pulse 53, SpO2 100.00%. Physical Exam: General: obese black female who is in NAD HEENT: head is normocephalic, atraumatic.  Sclera are noninjected.  PERRL.  Ears and nose without any masses or lesions.  PANDA in left nares. Heart: regular, rate, and rhythm.  Normal s1,s2. No obvious murmurs, gallops, or rubs noted.  Palpable radial and pedal pulses bilaterally Lungs: + bilateral rhonchi,  Respiratory effort nonlabored.  Trach in place with secretions pouring out onto gown Abd: soft, NT, ND, +BS, no masses, hernias, or organomegaly Psych: A&Ox3 with an appropriate affect.    Results for orders placed during the hospital encounter of 11/25/11 (from the past 48 hour(s))  APTT     Status: Normal   Collection Time   12/13/11  2:46 PM      Component Value Range Comment   aPTT 34  24 - 37 seconds   PROTIME-INR     Status: Normal   Collection Time   12/13/11  2:46 PM      Component Value Range Comment   Prothrombin Time 13.9  11.6 - 15.2 seconds    INR 1.08  0.00 - 1.49   CBC     Status: Abnormal   Collection Time   12/15/11  5:55 AM      Component Value Range Comment   WBC 11.5 (*) 4.0 - 10.5 K/uL    RBC 3.54 (*) 3.87 - 5.11 MIL/uL    Hemoglobin 8.9 (*) 12.0 - 15.0 g/dL    HCT 78.2 (*) 95.6 - 46.0 %    MCV 81.9  78.0 - 100.0 fL    MCH 25.1 (*) 26.0 - 34.0 pg    MCHC 30.7  30.0 - 36.0 g/dL    RDW 21.3 (*) 08.6 - 15.5 %    Platelets 241  150 - 400 K/uL   BASIC METABOLIC PANEL     Status: Abnormal   Collection Time   12/15/11  5:55 AM      Component Value Range Comment   Sodium 139  135 - 145 mEq/L    Potassium 4.3  3.5 - 5.1 mEq/L    Chloride 101  96 - 112 mEq/L    CO2 30  19 - 32 mEq/L    Glucose, Bld 110 (*) 70 - 99 mg/dL    BUN 53 (*)  6 - 23 mg/dL    Creatinine, Ser 4.54 (*) 0.50 - 1.10 mg/dL    Calcium 9.0  8.4 - 09.8 mg/dL    GFR calc non Af Amer 29  (*) >90 mL/min    GFR calc Af Amer 34 (*) >90 mL/min    Ir Fluoro Rm 30-60 Min  12/14/2011  *RADIOLOGY REPORT*  Clinical Data: Dysphasia, in need of gastrostomy tube placement for supplementation of nutrition.  IR FLOURO RM 0-60 MIN  Comparison: Abdominal radiograph - earlier same day; abdominal CT - 08/07/2008  Findings:  Preprocedural spot radiograph demonstrates redundant transverse colon overlying the expected location of the gastric fundus.  Ultrasound scanning was performed to demarcate the lateral and of the left lobe of the liver.  Scissors were placed to demarcate the desired location for gastrostomy tube placement.  Despite the insufflation of the stomach with air, the redundant transverse colon remained overlying the percutaneous access site. As such, the procedure was cancelled.  IMPRESSION: Cancelled percutaneous gastrostomy tube placement secondary to interposed transverse colon.  Would recommend surgical consultation for surgically placed gastrostomy tube.  Above findings discussed with Dr. Elias Else at the time of procedure cancellation.   Original Report Authenticated By: Waynard Reeds, M.D.    Dg Abd Portable 1v  12/14/2011  *RADIOLOGY REPORT*  Clinical Data: Feeding tube placement  PORTABLE ABDOMEN - 1 VIEW  Comparison: Abdominal film 12/14/2011  Findings: There is a feeding tube with weighted tip in the gastric body.  Barium within the colon.  IMPRESSION: Feeding tube with tip in the stomach.   Original Report Authenticated By: Genevive Bi, M.D.    Dg Abd Portable 1v  12/14/2011  *RADIOLOGY REPORT*  Clinical Data: Evaluate barium prior to potential percutaneous gastrostomy tube placement  PORTABLE ABDOMEN - 1 VIEW  Comparison: 12/02/2011  Findings:  Administered enteric contrast is seen within the cecum and ascending colon to the level of the hepatic flexure.  Air is seen within the transverse colon to the level of the splenic flexure.  Nonobstructive bowel gas pattern.   Evaluation for pneumoperitoneum is degraded secondary to supine patient positioned exclusion of the lower thorax.  An enteric tube overlies the left upper abdominal quadrant.  Lumbar spine degenerative change.  IMPRESSION: Enteric contrast seen within the cecum and ascending colon to the level of the hepatic flexure.   Original Report Authenticated By: Waynard Reeds, M.D.        Assessment/Plan 1. Chronic respiratory failure Patient Active Problem List  Diagnosis  . Type 2 diabetes mellitus with vascular disease  . Hyperlipdemia  . Obesity (BMI 30-39.9)  . TOBACCO ABUSE  . Sleep apnea  . Hypertensive heart disease with congestive heart failure  . CORONARY ARTERY DISEASE  . Chronic cor pulmonale  . ASTHMA  . C O P D  . Chronic kidney disease (CKD), stage IV (severe)  . Acute systolic congestive heart failure  . Acute respiratory failure with hypercapnia  . Cardiomyopathy of undetermined type  . History of gout  . Bifascicular block  . Other chronic pulmonary heart diseases  . Acute on chronic renal failure  . Anemia  . Swelling of extremity, right  . Acute on chronic systolic heart failure   Plan: I will discuss this patient with Dr. Jamey Ripa.  She is a poor operative candidate.  She is at risk for having to be back on the ventilator postoperatively given her history.  She also has significant cardiac risk as well for general  anesthesia.  The patient is unable to hold a conversation with me about this problem.  After I speak to Dr. Jamey Ripa, we will likely try to contact her daughter to speak with her about this prior to proceeding.  Shaquile Lutze E 12/15/2011, 2:23 PM Pager: [phone]

## 2011-12-16 NOTE — Consult Note (Signed)
Agree with A&P of KO,PA. VERY high riskfor additional surgery. We will plan to discuss with her daughter. Patient did not appear in favor of surgery

## 2011-12-17 LAB — BASIC METABOLIC PANEL
BUN: 58 mg/dL — ABNORMAL HIGH (ref 6–23)
CO2: 30 mEq/L (ref 19–32)
Chloride: 99 mEq/L (ref 96–112)
GFR calc Af Amer: 34 mL/min — ABNORMAL LOW (ref >90)
Glucose, Bld: 93 mg/dL (ref 70–99)
Potassium: 4.4 mEq/L (ref 3.5–5.1)

## 2011-12-17 LAB — CBC
HCT: 29.1 % — ABNORMAL LOW (ref 36.0–46.0)
Hemoglobin: 9 g/dL — ABNORMAL LOW (ref 12.0–15.0)
WBC: 10.4 10*3/uL (ref 4.0–10.5)

## 2011-12-19 LAB — BASIC METABOLIC PANEL
CO2: 29 mEq/L (ref 19–32)
Chloride: 97 mEq/L (ref 96–112)
Creatinine, Ser: 1.69 mg/dL — ABNORMAL HIGH (ref 0.50–1.10)

## 2011-12-19 LAB — CBC
HCT: 28.4 % — ABNORMAL LOW (ref 36.0–46.0)
Hemoglobin: 8.9 g/dL — ABNORMAL LOW (ref 12.0–15.0)
MCV: 82.1 fL (ref 78.0–100.0)
Platelets: 273 10*3/uL (ref 150–400)
RBC: 3.46 MIL/uL — ABNORMAL LOW (ref 3.87–5.11)
WBC: 9.3 10*3/uL (ref 4.0–10.5)

## 2011-12-19 LAB — PRO B NATRIURETIC PEPTIDE: Pro B Natriuretic peptide (BNP): 4544 pg/mL — ABNORMAL HIGH (ref 0–125)

## 2011-12-20 ENCOUNTER — Other Ambulatory Visit (HOSPITAL_COMMUNITY): Payer: Self-pay

## 2011-12-21 ENCOUNTER — Encounter (HOSPITAL_COMMUNITY): Payer: Self-pay | Admitting: Certified Registered"

## 2011-12-21 ENCOUNTER — Encounter
Admission: AD | Disposition: A | Discharge status: Skilled Nursing Facility | Payer: Self-pay | Source: Ambulatory Visit | Attending: Internal Medicine

## 2011-12-21 ENCOUNTER — Ambulatory Visit (HOSPITAL_COMMUNITY): Payer: Self-pay | Admitting: Certified Registered"

## 2011-12-21 HISTORY — PX: LAPAROSCOPIC GASTROSTOMY: SHX5896

## 2011-12-21 LAB — RENAL FUNCTION PANEL
BUN: 63 mg/dL — ABNORMAL HIGH (ref 6–23)
Chloride: 98 mEq/L (ref 96–112)
Glucose, Bld: 87 mg/dL (ref 70–99)
Phosphorus: 4.4 mg/dL (ref 2.3–4.6)
Potassium: 4.2 mEq/L (ref 3.5–5.1)

## 2011-12-21 LAB — GLUCOSE, CAPILLARY
Glucose-Capillary: 85 mg/dL (ref 70–99)
Glucose-Capillary: 98 mg/dL (ref 70–99)

## 2011-12-21 LAB — CBC
HCT: 31.2 % — ABNORMAL LOW (ref 36.0–46.0)
Hemoglobin: 9.6 g/dL — ABNORMAL LOW (ref 12.0–15.0)
MCHC: 30.8 g/dL (ref 30.0–36.0)

## 2011-12-21 SURGERY — CREATION, GASTROSTOMY, LAPAROSCOPIC
Anesthesia: General | Wound class: Clean Contaminated

## 2011-12-21 MED ORDER — FENTANYL CITRATE 0.05 MG/ML IJ SOLN
INTRAMUSCULAR | Status: DC | PRN
  Administered 2011-12-21: 100 ug via INTRAVENOUS

## 2011-12-21 MED ORDER — PROPOFOL 10 MG/ML IV BOLUS
INTRAVENOUS | Status: DC | PRN
  Administered 2011-12-21: 40 mg via INTRAVENOUS

## 2011-12-21 MED ORDER — EPHEDRINE SULFATE 50 MG/ML IJ SOLN
INTRAMUSCULAR | Status: DC | PRN
  Administered 2011-12-21: 10 mg via INTRAVENOUS

## 2011-12-21 MED ORDER — HYDROMORPHONE HCL PF 1 MG/ML IJ SOLN
0.2500 mg | INTRAMUSCULAR | Status: DC | PRN
  Administered 2011-12-21 (×4): 0.5 mg via INTRAVENOUS

## 2011-12-21 MED ORDER — ONDANSETRON HCL 4 MG/2ML IJ SOLN
INTRAMUSCULAR | Status: AC
  Administered 2011-12-21: 4 mg via INTRAVENOUS
  Filled 2011-12-21: qty 2

## 2011-12-21 MED ORDER — NEOSTIGMINE METHYLSULFATE 1 MG/ML IJ SOLN
INTRAMUSCULAR | Status: DC | PRN
  Administered 2011-12-21: 3 mg via INTRAVENOUS

## 2011-12-21 MED ORDER — BUPIVACAINE HCL (PF) 0.25 % IJ SOLN
INTRAMUSCULAR | Status: AC
  Filled 2011-12-21: qty 30

## 2011-12-21 MED ORDER — VECURONIUM BROMIDE 10 MG IV SOLR
INTRAVENOUS | Status: DC | PRN
  Administered 2011-12-21 (×3): 2 mg via INTRAVENOUS

## 2011-12-21 MED ORDER — ONDANSETRON HCL 4 MG/2ML IJ SOLN
4.0000 mg | Freq: Once | INTRAMUSCULAR | Status: AC
Start: 2011-12-21 — End: 2011-12-21
  Administered 2011-12-21: 4 mg via INTRAVENOUS

## 2011-12-21 MED ORDER — HYDROMORPHONE HCL PF 1 MG/ML IJ SOLN
INTRAMUSCULAR | Status: AC
  Filled 2011-12-21: qty 1

## 2011-12-21 MED ORDER — GLYCOPYRROLATE 0.2 MG/ML IJ SOLN
INTRAMUSCULAR | Status: DC | PRN
  Administered 2011-12-21: 0.4 mg via INTRAVENOUS
  Administered 2011-12-21: 0.2 mg via INTRAVENOUS

## 2011-12-21 MED ORDER — PHENYLEPHRINE HCL 10 MG/ML IJ SOLN
INTRAMUSCULAR | Status: DC | PRN
  Administered 2011-12-21 (×2): 80 ug via INTRAVENOUS

## 2011-12-21 MED ORDER — ROCURONIUM BROMIDE 100 MG/10ML IV SOLN
INTRAVENOUS | Status: DC | PRN
  Administered 2011-12-21: 20 mg via INTRAVENOUS
  Administered 2011-12-21: 30 mg via INTRAVENOUS

## 2011-12-21 MED ORDER — SODIUM CHLORIDE 0.9 % IV SOLN
INTRAVENOUS | Status: DC | PRN
  Administered 2011-12-21 (×2): via INTRAVENOUS

## 2011-12-21 MED ORDER — DEXTROSE 5 % IV SOLN
2.0000 g | INTRAVENOUS | Status: AC
  Administered 2011-12-21: 2 g via INTRAVENOUS
  Filled 2011-12-21: qty 2

## 2011-12-21 MED ORDER — ONDANSETRON HCL 4 MG/2ML IJ SOLN
INTRAMUSCULAR | Status: DC | PRN
  Administered 2011-12-21: 4 mg via INTRAVENOUS

## 2011-12-21 MED ORDER — SODIUM CHLORIDE 0.9 % IV SOLN
INTRAVENOUS | Status: DC
  Administered 2011-12-21: 09:00:00 via INTRAVENOUS

## 2011-12-21 MED ORDER — BUPIVACAINE HCL 0.25 % IJ SOLN
INTRAMUSCULAR | Status: DC | PRN
  Administered 2011-12-21: 7 mL

## 2011-12-21 SURGICAL SUPPLY — 48 items
BAG URINE DRAINAGE (UROLOGICAL SUPPLIES) ×2 IMPLANT
BENZOIN TINCTURE PRP APPL 2/3 (GAUZE/BANDAGES/DRESSINGS) ×2 IMPLANT
BLADE SURG ROTATE 9660 (MISCELLANEOUS) IMPLANT
CANISTER SUCTION 2500CC (MISCELLANEOUS) ×2 IMPLANT
CATH MALECOT BARD  24FR (CATHETERS) ×1
CATH MALECOT BARD 24FR (CATHETERS) ×1 IMPLANT
CATH MUSHROOM 22FR (CATHETERS) IMPLANT
CHLORAPREP W/TINT 26ML (MISCELLANEOUS) ×2 IMPLANT
COVER SURGICAL LIGHT HANDLE (MISCELLANEOUS) ×2 IMPLANT
DECANTER SPIKE VIAL GLASS SM (MISCELLANEOUS) ×2 IMPLANT
DERMABOND ADVANCED (GAUZE/BANDAGES/DRESSINGS) ×1
DERMABOND ADVANCED .7 DNX12 (GAUZE/BANDAGES/DRESSINGS) ×1 IMPLANT
DRAPE UTILITY 15X26 W/TAPE STR (DRAPE) ×4 IMPLANT
ELECT REM PT RETURN 9FT ADLT (ELECTROSURGICAL) ×2
ELECTRODE REM PT RTRN 9FT ADLT (ELECTROSURGICAL) ×1 IMPLANT
GAUZE SPONGE 2X2 8PLY STRL LF (GAUZE/BANDAGES/DRESSINGS) ×1 IMPLANT
GLOVE BIO SURGEON STRL SZ7 (GLOVE) ×2 IMPLANT
GLOVE BIO SURGEON STRL SZ7.5 (GLOVE) ×2 IMPLANT
GLOVE BIOGEL PI IND STRL 6.5 (GLOVE) ×1 IMPLANT
GLOVE BIOGEL PI IND STRL 7.0 (GLOVE) ×3 IMPLANT
GLOVE BIOGEL PI INDICATOR 6.5 (GLOVE) ×1
GLOVE BIOGEL PI INDICATOR 7.0 (GLOVE) ×3
GLOVE ECLIPSE 7.0 STRL STRAW (GLOVE) ×2 IMPLANT
GLOVE SURG SS PI 6.5 STRL IVOR (GLOVE) ×2 IMPLANT
GOWN PREVENTION PLUS XLARGE (GOWN DISPOSABLE) ×2 IMPLANT
GOWN STRL NON-REIN LRG LVL3 (GOWN DISPOSABLE) ×6 IMPLANT
KIT BASIN OR (CUSTOM PROCEDURE TRAY) ×2 IMPLANT
KIT ROOM TURNOVER OR (KITS) ×2 IMPLANT
NEEDLE INSUFFLATION 14GA 120MM (NEEDLE) ×2 IMPLANT
NS IRRIG 1000ML POUR BTL (IV SOLUTION) ×2 IMPLANT
PAD ARMBOARD 7.5X6 YLW CONV (MISCELLANEOUS) ×4 IMPLANT
PLUG CATH AND CAP STER (CATHETERS) ×2 IMPLANT
SCISSORS LAP 5X35 DISP (ENDOMECHANICALS) ×2 IMPLANT
SET IRRIG TUBING LAPAROSCOPIC (IRRIGATION / IRRIGATOR) IMPLANT
SLEEVE ENDOPATH XCEL 5M (ENDOMECHANICALS) ×6 IMPLANT
SPECIMEN JAR SMALL (MISCELLANEOUS) ×2 IMPLANT
SPONGE GAUZE 2X2 STER 10/PKG (GAUZE/BANDAGES/DRESSINGS) ×1
STRIP CLOSURE SKIN 1/2X4 (GAUZE/BANDAGES/DRESSINGS) ×2 IMPLANT
SUT ETHILON 2 0 FS 18 (SUTURE) ×2 IMPLANT
SUT MNCRL AB 3-0 PS2 27 (SUTURE) ×2 IMPLANT
SUT SILK 2 0 SH (SUTURE) ×14 IMPLANT
TAPE CLOTH SURG 4X10 WHT LF (GAUZE/BANDAGES/DRESSINGS) ×2 IMPLANT
TOWEL OR 17X24 6PK STRL BLUE (TOWEL DISPOSABLE) ×2 IMPLANT
TOWEL OR 17X26 10 PK STRL BLUE (TOWEL DISPOSABLE) ×2 IMPLANT
TRAY LAPAROSCOPIC (CUSTOM PROCEDURE TRAY) ×2 IMPLANT
TROCAR XCEL NON-BLD 11X100MML (ENDOMECHANICALS) ×2 IMPLANT
TROCAR XCEL NON-BLD 5MMX100MML (ENDOMECHANICALS) ×2 IMPLANT
WATER STERILE IRR 1000ML POUR (IV SOLUTION) IMPLANT

## 2011-12-21 NOTE — Anesthesia Preprocedure Evaluation (Addendum)
Anesthesia Evaluation  Patient identified by MRN, date of birth, ID band Patient awake    Reviewed: Allergy & Precautions, H&P , NPO status   Airway Mallampati: II     Comment: Patient has trach. Dental   Pulmonary shortness of breath and with exertion, asthma , sleep apnea , COPD oxygen dependent,  breath sounds clear to auscultation        Cardiovascular + CAD and +CHF Rhythm:Regular Rate:Normal     Neuro/Psych    GI/Hepatic Neg liver ROS, For gastrostomy tube.   Endo/Other  diabetes, Type 2  Renal/GU Renal disease     Musculoskeletal   Abdominal   Peds  Hematology   Anesthesia Other Findings   Reproductive/Obstetrics                          Anesthesia Physical Anesthesia Plan  ASA: IV  Anesthesia Plan: General   Post-op Pain Management:    Induction: Intravenous  Airway Management Planned: Tracheostomy  Additional Equipment:   Intra-op Plan:   Post-operative Plan: Possible Post-op intubation/ventilation  Informed Consent:   Plan Discussed with: Anesthesiologist, CRNA and Surgeon  Anesthesia Plan Comments:        Anesthesia Quick Evaluation

## 2011-12-21 NOTE — Progress Notes (Signed)
Received report

## 2011-12-21 NOTE — Preoperative (Signed)
Beta Blockers   Reason not to administer Beta Blockers:Not Applicable 

## 2011-12-21 NOTE — Op Note (Signed)
Pre Operative Diagnosis:  Feeding intolerance  Post Operative Diagnosis: same  Surgeon: Dr. Axel Filler  Procedure: Laparoscopic T-tube placement  Assistant: none  Anesthesia: GETA  EBL: 10 cc  Complications: none  Counts: reported as correct x 2  Findings:  Resection 24 French Malecot catheter placed within the stomach  Indications for procedure:  The patient is a 71 year old female whose has been intolerance and a piece undergo placement and long-term health care facility. Patient needed transgastric feeding access for placement  Details of the procedure: after the patient was consented patient was taken back to the operating room patient was then placed in the supine position and bilateral SCDs were  placed. After appropriate antibiotics were confirmed, a timeout was called and all factsverified.  The attending of 14 mmHg was obtained via sterile technique in the right subcostal margin. At this date of or trocar is placed intra-abdominally there was no injury to any intra-abdominal organs. A second final retrodisplaced the umbilicus as well as the midline. A fourth working trocar was then placed in the left subcostal margin. The stomach was identified and the colon was pulled down overlying stomach. The midportion of the stomach was identified for anchoring sutures were placed the stomach and to the anterior abdominal wall with 3-0 silk in interrupted fashion. The a pursestring stitch was then placed at the area of the gastrotomy. Bone cutters used to maintain hemostasis and a gastrotomy was made. A Kelly forceps was then used to place the Malecot tube within the gastrotomy. The pursestring was then secured down. We then were able to attach the remaining anchoring stitches the anterior abdominal wall and stomach. At this point and it was hemostatic. Insufflation was evacuated. All trochars were removed. The skin was reapproximated using 4-0 Monocryl in subcuticular fashion. The patient  was awakened from anesthesia was taken recovery in stable condition.

## 2011-12-21 NOTE — Transfer of Care (Signed)
Immediate Anesthesia Transfer of Care Note  Patient: [name]  Procedure(s) Performed: Procedure(s) (LRB) with comments: LAPAROSCOPIC GASTROSTOMY (N/A) - laparoscopic gastrostomy possible open feeding tube  Patient Location: PACU  Anesthesia Type:General  Level of Consciousness: Patient remains intubated per anesthesia plan  Airway & Oxygen Therapy: Patient Spontanous Breathing, Patient remains intubated per anesthesia plan and Patient placed on Ventilator (see vital sign flow sheet for setting)  Post-op Assessment: Report given to PACU RN and Post -op Vital signs reviewed and stable  Post vital signs: Reviewed and stable  Complications: No apparent anesthesia complications

## 2011-12-21 NOTE — Anesthesia Postprocedure Evaluation (Signed)
  Anesthesia Post-op Note  Patient: [name]  Procedure(s) Performed: Procedure(s) (LRB) with comments: LAPAROSCOPIC GASTROSTOMY (N/A) - laparoscopic gastrostomy possible open feeding tube  Patient Location: PACU  Anesthesia Type:General  Level of Consciousness: awake  Airway and Oxygen Therapy: Patient connected to T-piece oxygen  Post-op Pain: mild  Post-op Assessment: Post-op Vital signs reviewed  Post-op Vital Signs: Reviewed  Complications: No apparent anesthesia complications

## 2011-12-21 NOTE — Anesthesia Procedure Notes (Signed)
Date/Time: 12/21/2011 9:55 AM Performed by: Jerilee Hoh Pre-anesthesia Checklist: Patient identified, Emergency Drugs available, Suction available and Patient being monitored Patient Re-evaluated:Patient Re-evaluated prior to inductionOxygen Delivery Method: Circle system utilized Preoxygenation: Pre-oxygenation with 100% oxygen Intubation Type: Inhalational induction with existing ETT and Tracheostomy Placement Confirmation: positive ETCO2 and breath sounds checked- equal and bilateral Dental Injury: Teeth and Oropharynx as per pre-operative assessment  Comments: Patient arrived to OR with 6.5 cuffed trach.  +EtCO2 and +BBS verified.

## 2011-12-22 LAB — BASIC METABOLIC PANEL
Chloride: 97 mEq/L (ref 96–112)
GFR calc Af Amer: 24 mL/min — ABNORMAL LOW (ref >90)
Potassium: 4.4 mEq/L (ref 3.5–5.1)

## 2011-12-22 NOTE — Progress Notes (Signed)
Pt doing well.  Tol clmapling of Gtube. Can start TFs today DC NGT when tol TF started and tol

## 2011-12-22 NOTE — Progress Notes (Signed)
Patient ID: SAFIA PANZER, female   DOB: Jul 22, 1940, 71 y.o.   MRN: [id number] 1 Day Post-Op  Subjective: 71 y/o female POD 1 s/p lap G-tube placement.  Pt feeling okay today except for pain at surgical sites.  Pt denies any nausea, vomiting, CP/SOB, or leg tenderness.  Pt has trach, NG, and G tube in place.    Objective: Vital signs in last 24 hours: Temp:  [97.1 F (36.2 C)-97.3 F (36.3 C)] 97.3 F (36.3 C) (10/29 1345) Pulse Rate:  [52-56] 52  (10/29 1345) Resp:  [14-30] 14  (10/29 1345) BP: (119-156)/(65-72) 138/70 mmHg (10/29 1345) SpO2:  [99 %-100 %] 100 % (10/29 1345) FiO2 (%):  [40 %] 40 % (10/29 1345)    Intake/Output from previous day: 10/29 0701 - 10/30 0700 In: 675 [I.V.:675] Out: 125 [Urine:125] Intake/Output this shift:    PE: Gen:  Alert, NAD, pleasant Card:  RRR, no murmur heard Pulm:  CTA, no wheezing Abd: Soft, moderate tenderness, ND, +BS, G tube drain in place, other incisions also C/D/I Ext:  No erythema, edema, or tenderness  Lab Results:   [facility] 12/21/11 0735  WBC 11.9*  HGB 9.6*  HCT 31.2*  PLT 287   BMET  Basename 12/22/11 0534 12/21/11 0733  NA 136 137  K 4.4 4.2  CL 97 98  CO2 28 30  GLUCOSE 87 87  BUN 63* 63*  CREATININE 2.25* 1.88*  CALCIUM 9.2 9.4   PT/INR No results found for this basename: LABPROT:2,INR:2 in the last 72 hours CMP     Component Value Date/Time   NA 136 12/22/2011 0534   K 4.4 12/22/2011 0534   CL 97 12/22/2011 0534   CO2 28 12/22/2011 0534   GLUCOSE 87 12/22/2011 0534   BUN 63* 12/22/2011 0534   CREATININE 2.25* 12/22/2011 0534   CALCIUM 9.2 12/22/2011 0534   PROT 5.6* 11/26/2011 0545   ALBUMIN 2.2* 12/21/2011 0733   AST 19 11/26/2011 0545   ALT 15 11/26/2011 0545   ALKPHOS 52 11/26/2011 0545   BILITOT 1.1 11/26/2011 0545   GFRNONAA 21* 12/22/2011 0534   GFRAA 24* 12/22/2011 0534   Lipase     Component Value Date/Time   LIPASE 18 08/07/2008 1910       Studies/Results: No results  found.  Anti-infectives: Anti-infectives     Start     Dose/Rate Route Frequency Ordered Stop   12/21/11 0945   cefOXitin (MEFOXIN) 2 g in dextrose 5 % 50 mL IVPB        2 g 100 mL/hr over 30 Minutes Intravenous To Surgery 12/21/11 0939 12/21/11 1006           Assessment/Plan  71 y/o female POD 1 s/p lap G-tube placement.   1.  Ordered to clamp NG tube for 8 hours and start trickle TF if not nauseated.  If she become nauseated can return to gravity drainage 2.  Cont. Leg compression 3.  Ambulate OOB as tolerated 4.  Cont. pain mgt 5.  Will discuss with Dr. Derrell Lolling regarding potential for discharge   Chronic Renal Failure 1.  Creatinine increased today from 1.88 to 2.25.  BUM stable.  Medicine following     LOS: 27 days    DORT, Sandee Bernath 12/22/2011, 8:11 AM Pager: [phone]

## 2011-12-23 ENCOUNTER — Encounter (HOSPITAL_COMMUNITY): Payer: Self-pay | Admitting: General Surgery

## 2011-12-23 LAB — BASIC METABOLIC PANEL
CO2: 30 mEq/L (ref 19–32)
Chloride: 99 mEq/L (ref 96–112)
Potassium: 4.4 mEq/L (ref 3.5–5.1)
Sodium: 137 mEq/L (ref 135–145)

## 2011-12-23 LAB — CBC
HCT: 29 % — ABNORMAL LOW (ref 36.0–46.0)
MCV: 82.4 fL (ref 78.0–100.0)
Platelets: 284 10*3/uL (ref 150–400)
RBC: 3.52 MIL/uL — ABNORMAL LOW (ref 3.87–5.11)
WBC: 10.1 10*3/uL (ref 4.0–10.5)

## 2011-12-24 LAB — BASIC METABOLIC PANEL
BUN: 73 mg/dL — ABNORMAL HIGH (ref 6–23)
CO2: 32 mEq/L (ref 19–32)
Chloride: 99 mEq/L (ref 96–112)
Creatinine, Ser: 2.3 mg/dL — ABNORMAL HIGH (ref 0.50–1.10)

## 2011-12-27 ENCOUNTER — Encounter (HOSPITAL_COMMUNITY): Payer: Self-pay | Admitting: *Deleted

## 2011-12-27 ENCOUNTER — Observation Stay (HOSPITAL_COMMUNITY)
Admission: EM | Admit: 2011-12-27 | Discharge: 2012-01-01 | Disposition: A | Discharge status: Skilled Nursing Facility | Payer: Medicare Other | Attending: Internal Medicine | Admitting: Internal Medicine

## 2011-12-27 ENCOUNTER — Emergency Department (HOSPITAL_COMMUNITY): Payer: Medicare Other

## 2011-12-27 DIAGNOSIS — L03319 Cellulitis of trunk, unspecified: Secondary | ICD-10-CM | POA: Insufficient documentation

## 2011-12-27 DIAGNOSIS — E785 Hyperlipidemia, unspecified: Secondary | ICD-10-CM | POA: Insufficient documentation

## 2011-12-27 DIAGNOSIS — R82998 Other abnormal findings in urine: Secondary | ICD-10-CM | POA: Insufficient documentation

## 2011-12-27 DIAGNOSIS — R0602 Shortness of breath: Secondary | ICD-10-CM | POA: Insufficient documentation

## 2011-12-27 DIAGNOSIS — J9610 Chronic respiratory failure, unspecified whether with hypoxia or hypercapnia: Secondary | ICD-10-CM | POA: Insufficient documentation

## 2011-12-27 DIAGNOSIS — I452 Bifascicular block: Secondary | ICD-10-CM

## 2011-12-27 DIAGNOSIS — E119 Type 2 diabetes mellitus without complications: Secondary | ICD-10-CM | POA: Insufficient documentation

## 2011-12-27 DIAGNOSIS — I279 Pulmonary heart disease, unspecified: Secondary | ICD-10-CM

## 2011-12-27 DIAGNOSIS — L0390 Cellulitis, unspecified: Secondary | ICD-10-CM

## 2011-12-27 DIAGNOSIS — N189 Chronic kidney disease, unspecified: Secondary | ICD-10-CM

## 2011-12-27 DIAGNOSIS — D72829 Elevated white blood cell count, unspecified: Secondary | ICD-10-CM | POA: Insufficient documentation

## 2011-12-27 DIAGNOSIS — T85698A Other mechanical complication of other specified internal prosthetic devices, implants and grafts, initial encounter: Secondary | ICD-10-CM | POA: Insufficient documentation

## 2011-12-27 DIAGNOSIS — N184 Chronic kidney disease, stage 4 (severe): Secondary | ICD-10-CM | POA: Insufficient documentation

## 2011-12-27 DIAGNOSIS — K560 Paralytic ileus: Secondary | ICD-10-CM | POA: Insufficient documentation

## 2011-12-27 DIAGNOSIS — M109 Gout, unspecified: Secondary | ICD-10-CM

## 2011-12-27 DIAGNOSIS — I130 Hypertensive heart and chronic kidney disease with heart failure and stage 1 through stage 4 chronic kidney disease, or unspecified chronic kidney disease: Secondary | ICD-10-CM | POA: Insufficient documentation

## 2011-12-27 DIAGNOSIS — I5021 Acute systolic (congestive) heart failure: Secondary | ICD-10-CM

## 2011-12-27 DIAGNOSIS — Z931 Gastrostomy status: Secondary | ICD-10-CM | POA: Insufficient documentation

## 2011-12-27 DIAGNOSIS — I2789 Other specified pulmonary heart diseases: Secondary | ICD-10-CM

## 2011-12-27 DIAGNOSIS — Z23 Encounter for immunization: Secondary | ICD-10-CM | POA: Insufficient documentation

## 2011-12-27 DIAGNOSIS — D649 Anemia, unspecified: Secondary | ICD-10-CM

## 2011-12-27 DIAGNOSIS — L02219 Cutaneous abscess of trunk, unspecified: Secondary | ICD-10-CM | POA: Insufficient documentation

## 2011-12-27 DIAGNOSIS — G4733 Obstructive sleep apnea (adult) (pediatric): Secondary | ICD-10-CM | POA: Insufficient documentation

## 2011-12-27 DIAGNOSIS — M7989 Other specified soft tissue disorders: Secondary | ICD-10-CM

## 2011-12-27 DIAGNOSIS — J9503 Malfunction of tracheostomy stoma: Secondary | ICD-10-CM | POA: Insufficient documentation

## 2011-12-27 DIAGNOSIS — J45909 Unspecified asthma, uncomplicated: Secondary | ICD-10-CM

## 2011-12-27 DIAGNOSIS — L03311 Cellulitis of abdominal wall: Secondary | ICD-10-CM

## 2011-12-27 DIAGNOSIS — K7460 Unspecified cirrhosis of liver: Secondary | ICD-10-CM | POA: Insufficient documentation

## 2011-12-27 DIAGNOSIS — I509 Heart failure, unspecified: Secondary | ICD-10-CM | POA: Insufficient documentation

## 2011-12-27 DIAGNOSIS — Y849 Medical procedure, unspecified as the cause of abnormal reaction of the patient, or of later complication, without mention of misadventure at the time of the procedure: Secondary | ICD-10-CM | POA: Insufficient documentation

## 2011-12-27 DIAGNOSIS — E669 Obesity, unspecified: Secondary | ICD-10-CM | POA: Insufficient documentation

## 2011-12-27 DIAGNOSIS — E1159 Type 2 diabetes mellitus with other circulatory complications: Secondary | ICD-10-CM

## 2011-12-27 DIAGNOSIS — D638 Anemia in other chronic diseases classified elsewhere: Secondary | ICD-10-CM | POA: Insufficient documentation

## 2011-12-27 DIAGNOSIS — I5023 Acute on chronic systolic (congestive) heart failure: Secondary | ICD-10-CM | POA: Insufficient documentation

## 2011-12-27 DIAGNOSIS — K9422 Gastrostomy infection: Principal | ICD-10-CM | POA: Insufficient documentation

## 2011-12-27 DIAGNOSIS — R1310 Dysphagia, unspecified: Secondary | ICD-10-CM | POA: Insufficient documentation

## 2011-12-27 DIAGNOSIS — J449 Chronic obstructive pulmonary disease, unspecified: Secondary | ICD-10-CM | POA: Diagnosis present

## 2011-12-27 DIAGNOSIS — I2510 Atherosclerotic heart disease of native coronary artery without angina pectoris: Secondary | ICD-10-CM

## 2011-12-27 DIAGNOSIS — I429 Cardiomyopathy, unspecified: Secondary | ICD-10-CM

## 2011-12-27 DIAGNOSIS — F17200 Nicotine dependence, unspecified, uncomplicated: Secondary | ICD-10-CM

## 2011-12-27 DIAGNOSIS — J4489 Other specified chronic obstructive pulmonary disease: Secondary | ICD-10-CM | POA: Insufficient documentation

## 2011-12-27 DIAGNOSIS — I110 Hypertensive heart disease with heart failure: Secondary | ICD-10-CM

## 2011-12-27 DIAGNOSIS — J9602 Acute respiratory failure with hypercapnia: Secondary | ICD-10-CM

## 2011-12-27 HISTORY — DX: Unspecified osteoarthritis, unspecified site: M19.90

## 2011-12-27 HISTORY — DX: Gastro-esophageal reflux disease without esophagitis: K21.9

## 2011-12-27 LAB — CBC WITH DIFFERENTIAL/PLATELET
Eosinophils Absolute: 0.1 10*3/uL (ref 0.0–0.7)
Eosinophils Relative: 1 % (ref 0–5)
Lymphs Abs: 2.1 10*3/uL (ref 0.7–4.0)
MCH: 25.8 pg — ABNORMAL LOW (ref 26.0–34.0)
MCV: 82.5 fL (ref 78.0–100.0)
Monocytes Absolute: 0.8 10*3/uL (ref 0.1–1.0)
Monocytes Relative: 7 % (ref 3–12)
Platelets: 262 10*3/uL (ref 150–400)
RBC: 3.6 MIL/uL — ABNORMAL LOW (ref 3.87–5.11)

## 2011-12-27 LAB — POCT I-STAT 3, VENOUS BLOOD GAS (G3P V)
pCO2, Ven: 55.7 mmHg — ABNORMAL HIGH (ref 45.0–50.0)
pH, Ven: 7.396 — ABNORMAL HIGH (ref 7.250–7.300)

## 2011-12-27 LAB — COMPREHENSIVE METABOLIC PANEL
BUN: 59 mg/dL — ABNORMAL HIGH (ref 6–23)
CO2: 34 mEq/L — ABNORMAL HIGH (ref 19–32)
Calcium: 9.3 mg/dL (ref 8.4–10.5)
Creatinine, Ser: 2.02 mg/dL — ABNORMAL HIGH (ref 0.50–1.10)
GFR calc Af Amer: 27 mL/min — ABNORMAL LOW (ref >90)
GFR calc non Af Amer: 24 mL/min — ABNORMAL LOW (ref >90)
Glucose, Bld: 103 mg/dL — ABNORMAL HIGH (ref 70–99)

## 2011-12-27 LAB — PRO B NATRIURETIC PEPTIDE: Pro B Natriuretic peptide (BNP): 7141 pg/mL — ABNORMAL HIGH (ref 0–125)

## 2011-12-27 LAB — LACTIC ACID, PLASMA: Lactic Acid, Venous: 0.7 mmol/L (ref 0.5–2.2)

## 2011-12-27 MED ORDER — NITROGLYCERIN 0.2 MG/HR TD PT24
0.2000 mg | MEDICATED_PATCH | Freq: Every day | TRANSDERMAL | Status: DC
  Administered 2011-12-28 – 2012-01-01 (×5): 0.2 mg via TRANSDERMAL
  Filled 2011-12-27 (×5): qty 1

## 2011-12-27 MED ORDER — PANTOPRAZOLE SODIUM 40 MG PO TBEC
40.0000 mg | DELAYED_RELEASE_TABLET | Freq: Two times a day (BID) | ORAL | Status: DC
  Administered 2011-12-28: 40 mg via ORAL
  Filled 2011-12-27: qty 1

## 2011-12-27 MED ORDER — CLINDAMYCIN PHOSPHATE 900 MG/50ML IV SOLN
900.0000 mg | Freq: Once | INTRAVENOUS | Status: AC
  Administered 2011-12-27: 900 mg via INTRAVENOUS
  Filled 2011-12-27: qty 50

## 2011-12-27 MED ORDER — HEPARIN SODIUM (PORCINE) 5000 UNIT/ML IJ SOLN
5000.0000 [IU] | Freq: Three times a day (TID) | INTRAMUSCULAR | Status: DC
  Administered 2011-12-28 – 2012-01-01 (×14): 5000 [IU] via SUBCUTANEOUS
  Filled 2011-12-27 (×17): qty 1

## 2011-12-27 MED ORDER — ONDANSETRON HCL 4 MG/2ML IJ SOLN: 4.0000 mg | Freq: Four times a day (QID) | INTRAMUSCULAR | Status: DC | PRN

## 2011-12-27 MED ORDER — CLINDAMYCIN PHOSPHATE 300 MG/50ML IV SOLN
300.0000 mg | Freq: Three times a day (TID) | INTRAVENOUS | Status: DC
  Administered 2011-12-28: 300 mg via INTRAVENOUS
  Filled 2011-12-27 (×3): qty 50

## 2011-12-27 MED ORDER — ONDANSETRON HCL 4 MG PO TABS: 4.0000 mg | ORAL_TABLET | Freq: Four times a day (QID) | ORAL | Status: DC | PRN

## 2011-12-27 MED ORDER — HYDROCODONE-ACETAMINOPHEN 5-325 MG PO TABS
1.0000 | ORAL_TABLET | ORAL | Status: DC | PRN
  Administered 2011-12-28: 2 via ORAL
  Filled 2011-12-27: qty 2

## 2011-12-27 MED ORDER — ESCITALOPRAM OXALATE 5 MG PO TABS
7.5000 mg | ORAL_TABLET | Freq: Every day | ORAL | Status: DC
  Filled 2011-12-27 (×2): qty 2

## 2011-12-27 MED ORDER — IPRATROPIUM BROMIDE 0.02 % IN SOLN: 0.5000 mg | RESPIRATORY_TRACT | Status: DC | PRN

## 2011-12-27 MED ORDER — HYDRALAZINE HCL 50 MG PO TABS
50.0000 mg | ORAL_TABLET | Freq: Three times a day (TID) | ORAL | Status: DC
  Administered 2011-12-28 – 2011-12-29 (×5): 50 mg via ORAL
  Filled 2011-12-27 (×8): qty 1

## 2011-12-27 MED ORDER — ACETAMINOPHEN 650 MG RE SUPP: 650.0000 mg | Freq: Four times a day (QID) | RECTAL | Status: DC | PRN

## 2011-12-27 MED ORDER — CARVEDILOL 12.5 MG PO TABS
12.5000 mg | ORAL_TABLET | Freq: Two times a day (BID) | ORAL | Status: DC
  Administered 2011-12-28 – 2011-12-29 (×3): 12.5 mg
  Filled 2011-12-27 (×5): qty 1

## 2011-12-27 MED ORDER — PRO-STAT SUGAR FREE PO LIQD
30.0000 mL | Freq: Three times a day (TID) | ORAL | Status: DC
  Administered 2011-12-28: 30 mL
  Filled 2011-12-27 (×4): qty 30

## 2011-12-27 MED ORDER — TORSEMIDE 20 MG PO TABS
20.0000 mg | ORAL_TABLET | Freq: Every day | ORAL | Status: DC
  Administered 2011-12-28: 20 mg via ORAL
  Filled 2011-12-27 (×2): qty 1

## 2011-12-27 MED ORDER — ALBUTEROL SULFATE (5 MG/ML) 0.5% IN NEBU: 2.5000 mg | INHALATION_SOLUTION | RESPIRATORY_TRACT | Status: DC | PRN

## 2011-12-27 MED ORDER — ACETAMINOPHEN 325 MG PO TABS
650.0000 mg | ORAL_TABLET | Freq: Four times a day (QID) | ORAL | Status: DC | PRN
  Administered 2011-12-28: 650 mg via ORAL
  Filled 2011-12-27: qty 2

## 2011-12-27 MED ORDER — SODIUM CHLORIDE 0.9 % IJ SOLN
3.0000 mL | INTRAMUSCULAR | Status: DC | PRN
  Administered 2011-12-28 – 2011-12-31 (×2): 3 mL via INTRAVENOUS

## 2011-12-27 NOTE — ED Notes (Addendum)
EMS-pt is from [facility]. Pt with trach. Pt warm to the touch. Reports tightness in breathing. Pt given 10mg  of albuterol and 0.5mg  of atrovent. Lungs clearing up en route. Pt also has gastric tube that is leaking per staff. Bundle branch of monitor, hx of same

## 2011-12-27 NOTE — ED Provider Notes (Signed)
I saw and evaluated the patient, reviewed the resident's note and I agree with the findings and plan. I reviewed and interpreted the EKG during the patient's evaluation in the ED and agree with the resident's interpretation.  Pt with abdominal ttp and focal area of redness around her G tube site.  Suspect that is the etiology of her pain.  Will plan on admission for IV abx.  If not improving , will consider abd ct.  Celene Kras, MD 12/27/11 2114

## 2011-12-27 NOTE — ED Notes (Addendum)
Spoke with Sunday Corn, daughter of pt,family updated on pt status & plan of care of pt, family requests that she be contacted if plan of care changes & status updates [phone]

## 2011-12-27 NOTE — ED Notes (Signed)
Venous Blood Gas results to Dr Gregary Cromer

## 2011-12-27 NOTE — ED Provider Notes (Signed)
History     CSN: [id number]  Arrival date & time 12/27/11  1717   First MD Initiated Contact with Patient 12/27/11 1737      Chief Complaint  Patient presents with  . Shortness of Breath    (Consider location/radiation/quality/duration/timing/severity/associated sxs/prior treatment) HPI Pt complains of drainage from feeding tube.  Pain described as mild in intensity. The location of the patient's problem is L side of abdomen.  Onset was today with unchanged course since that time.   Modifying factors:  Worse with palpation.  Associated symptoms: subjective fever, mild SOB.   Additional history obtained from Hilaria Ota, nurse at [facility]. She states that she was working with patient today who stated that she needed to be adjusted in bed to breath better. Also complained of feeling warm. While adjusting nurse noted drainage from T tube in left abdomen. Vitals taken by them with temp of 101.4.  Past Medical History  Diagnosis Date  . COPD (chronic obstructive pulmonary disease)     on home o2  . Chronic renal insufficiency   . Type II or unspecified type diabetes mellitus without mention of complication, not stated as uncontrolled   . CVA (cerebral infarction)     hx  . Hypertensive heart disease with congestive heart failure   . Chronic cor pulmonale   . Cardiomyopathy of undetermined type 09/07/2011  . Chronic kidney disease (CKD), stage IV (severe)   . Hyperlipdemia   . Obesity (BMI 30-39.9)   . Sleep apnea   . History of gout 09/07/2011  . Heart failure 7/13  . Shortness of breath   . Anemia   . Vascular disease   . Asthma     Past Surgical History  Procedure Date  . Bilateral oophorectomy   . Cardiac catheterization     right heart cath  . Laparoscopic gastrostomy 12/21/2011    Procedure: LAPAROSCOPIC GASTROSTOMY;  Surgeon: Axel Filler, MD;  Location: [facility] OR;  Service: General;  Laterality: N/A;  laparoscopic gastrostomy possible open feeding  tube    Family History  Problem Relation Age of Onset  . Heart attack Father   . Stroke Mother     CVA  . Coronary artery disease Daughter     History  Substance Use Topics  . Smoking status: Former Smoker -- 1.0 packs/day for 55 years    Types: Cigarettes    Quit date: 08/28/2011  . Smokeless tobacco: Never Used  . Alcohol Use: No    OB History    Grav Para Term Preterm Abortions TAB SAB Ect Mult Living                  Review of Systems Negative for respiratory distress, chest pain.  Negative for vomiting, diarrhea.  All other systems reviewed and negative unless noted in HPI.    Allergies  Sulfonamide derivatives  Home Medications   Current Outpatient Rx  Name  Route  Sig  Dispense  Refill  . ALBUTEROL SULFATE (5 MG/ML) 0.5% IN NEBU   Nebulization   Take 0.5 mLs (2.5 mg total) by nebulization every 4 (four) hours as needed for wheezing or shortness of breath.   20 mL      . ATROPINE SULFATE 1 % OP SOLN   Both Eyes   Place 1 drop into both eyes 4 (four) times daily as needed. For secretions         . CARVEDILOL 12.5 MG PO TABS   Oral  Take 12.5 mg by mouth 2 (two) times daily with a meal.         . VITAMIN D 1000 UNITS PO TABS   Oral   Take 2,000 Units by mouth daily.         Marland Kitchen ESCITALOPRAM OXALATE 5 MG PO TABS   Oral   Take 7.5 mg by mouth daily.         Marland Kitchen PRO-STAT 64 PO LIQD   Per Tube   Place 30 mLs into feeding tube 3 (three) times daily.   900 mL      . FLUCONAZOLE 200 MG PO TABS   Oral   Take 200 mg by mouth daily.         Marland Kitchen HYDRALAZINE HCL 25 MG PO TABS   Oral   Take 50 mg by mouth every 8 (eight) hours.         . IPRATROPIUM-ALBUTEROL 0.5-2.5 (3) MG/3ML IN SOLN   Nebulization   Take 3 mLs by nebulization every 6 (six) hours as needed. For shortness of breath         . ADULT MULTIVITAMIN LIQUID CH   Per Tube   Place 5 mLs into feeding tube daily.         Marland Kitchen NITROGLYCERIN 0.2 MG/HR TD PT24   Transdermal    Place 1 patch onto the skin daily. Also has a 0.4mg  patch         . NITROGLYCERIN 0.4 MG/HR TD PT24   Transdermal   Place 1 patch onto the skin daily. Also has a 0.2mg  patch         . PANTOPRAZOLE SODIUM 40 MG PO TBEC   Oral   Take 40 mg by mouth 2 (two) times daily.         Marland Kitchen POTASSIUM CHLORIDE 20 MEQ/15ML (10%) PO SOLN   Oral   Take 40 mEq by mouth daily.         Marland Kitchen PRAVASTATIN SODIUM 40 MG PO TABS   Oral   Take 40 mg by mouth daily.         . SCOPOLAMINE BASE 1.5 MG TD PT72   Transdermal   Place 1 patch onto the skin every 3 (three) days.         . SENNA 8.6 MG PO TABS   Oral   Take 1 tablet by mouth 2 (two) times daily as needed. For constipation         . TORSEMIDE 20 MG PO TABS   Oral   Take 20 mg by mouth daily.            BP 146/79  Pulse 70  Temp 99.1 F (37.3 C) (Oral)  Resp 19  SpO2 100%  Physical Exam Nursing note and vitals reviewed.  Constitutional: Pt is alert and appears stated age. Oropharynx: Airway open without erythema or exudate. Trach present.  Respiratory: No respiratory distress. Equal breathing bilaterally. Intermittent wheezing.  CV: Extremities warm and well perfused. Neuro: No motor nor sensory deficit. Head: Normocephalic and atraumatic. Eyes: No conjunctivitis, no scleral icterus. Neck: Supple, no mass. Chest: Non-tender. Abdomen: T tube on R side of abdomen sutured in place. Surrounding erythema present. Scant purulent drainage present.  MSK: Extremities are atraumatic without deformity. Skin: No rash, no wounds.  ED Course  Procedures (including critical care time)  Labs Reviewed  CBC WITH DIFFERENTIAL - Abnormal; Notable for the following:    WBC 12.0 (*)     RBC 3.60 (*)  Hemoglobin 9.3 (*)     HCT 29.7 (*)     MCH 25.8 (*)     RDW 16.6 (*)     Neutro Abs 9.0 (*)     All other components within normal limits  COMPREHENSIVE METABOLIC PANEL - Abnormal; Notable for the following:    CO2 34 (*)      Glucose, Bld 103 (*)     BUN 59 (*)     Creatinine, Ser 2.02 (*)     Albumin 2.3 (*)     Total Bilirubin 0.2 (*)     GFR calc non Af Amer 24 (*)     GFR calc Af Amer 27 (*)     All other components within normal limits  PRO B NATRIURETIC PEPTIDE - Abnormal; Notable for the following:    Pro B Natriuretic peptide (BNP) 7141.0 (*)     All other components within normal limits  POCT I-STAT 3, BLOOD GAS (G3P V) - Abnormal; Notable for the following:    pH, Ven 7.396 (*)     pCO2, Ven 55.7 (*)     pO2, Ven 24.0 (*)     Bicarbonate 34.2 (*)     Acid-Base Excess 7.0 (*)     All other components within normal limits  LACTIC ACID, PLASMA  CULTURE, BLOOD (ROUTINE X 2)  CULTURE, BLOOD (ROUTINE X 2)   Dg Abd Acute W/chest  12/27/2011  *RADIOLOGY REPORT*  Clinical Data: Abdominal pain.  Discharge from peg tube.  Shortness of breath.  Diabetes.  Hypertension.  ACUTE ABDOMEN SERIES (ABDOMEN 2 VIEW & CHEST 1 VIEW)  Comparison: 12/20/2011  Findings: Tracheostomy tube projects over the tracheal air shadow.  Stable appearance of cardiomegaly with apparent volume loss in the left lung causing leftward deviation of cardiac and mediastinal contours.  A band of opacity is present in the left upper lobe just above the cardiac shadow, and there is complete obscuration of the left hemidiaphragm, presumably from left lower lobe airspace opacity.  Left side down lateral decubitus view of the abdomen demonstrates air fluid levels in the colon and mildly dilated loops of left abdominal small bowel.  There is evidence of chronic bilateral sacroiliitis.  Lower lumbar spondylosis noted.  Vascular calcifications are present in the upper abdomen.  IMPRESSION:  1.  Cardiomegaly. 2.  Suspected atelectasis in the left lower lobe and lingula, causing leftward shift of cardiac and mediastinal structures. Entities such as a central obstructing lesion cannot be excluded, and chest CT should be considered if this atelectasis persists.  3.  Air-fluid levels and loops of large bowel and mildly dilated small bowel, abnormal but technically nonspecific.  The appearance could reflect ileus or low-level distal obstruction. 4.  Chronic bilateral sacroiliitis. 5.  Lower lumbar spondylosis.   Original Report Authenticated By: Gaylyn Rong, M.D.      1. Cellulitis   2. Acute on chronic systolic heart failure       MDM  71 y.o. female here with drainage from recently placed T tube for feeding.  Pertinent past problems include COPD, CHF, DM, HTN. Afebrile here. Concerned for cellulitis surrounding feeding tube. Will treat and recommend admission.  Medications/interventions:  Oxygen. IV clinda after blood cx's drawn.   Data reviewed: Record review: T tube placed on 10/29 by Dr. Derrell Lolling. D/c from hospital about one month ago after admission, intubation, trach for hypercarbic respiratory failure.  EKG ordered and interpreted by me: NSR, no axis deviation, no QRS widening, nonspecific ST-T wave  changes and no significant changes from prior EKG.  Lab tests ordered and reviewed by me: CBC with slight leukocytosis. Normal pH. Lactic acid low. BNP elevated. BUN/Cr elevated. I independently viewed the following imaging studies and reviewed radiology's interpretation as summarized: CXR with cardiomegaly, atelectasis.  Course of care: Pt stable on re-eval. Medicine called. Pt to be admitted.   Medical Decision Making discussed with ED attending Celene Kras, MD          Charm Barges, MD 12/27/11 2100

## 2011-12-27 NOTE — H&P (Addendum)
PCP:   Gwynneth Aliment, MD   Chief Complaint:  Abdominal pain  HPI: This is a 71 year old female t tube placed last month. She's been doing well however, yesterday she developed some abdominal pain around the feeding tube and also generalized. Today she was noted to have mild purulent draining around the T-tube,she later developed a temperature of 101. She was sent to the ER. She denies any worsening shortness of breath, wheezing, significant cough. She denies any nausea, vomiting or diarrhea. In the ER she was noted to have cellulitis around her T tube. Request is made for admission. History provided by the patient.   Review of Systems:  The patient denies anorexia, fever, weight loss,, vision loss, decreased hearing, hoarseness, chest pain, syncope, dyspnea on exertion, peripheral edema, balance deficits, hemoptysis, melena, hematochezia, severe indigestion/heartburn, hematuria, incontinence, genital sores, muscle weakness, suspicious skin lesions, transient blindness, difficulty walking, depression, unusual weight change, abnormal bleeding, enlarged lymph nodes, angioedema, and breast masses.  Past Medical History: Past Medical History  Diagnosis Date  . COPD (chronic obstructive pulmonary disease)     on home o2  . Chronic renal insufficiency   . Type II or unspecified type diabetes mellitus without mention of complication, not stated as uncontrolled   . CVA (cerebral infarction)     hx  . Hypertensive heart disease with congestive heart failure   . Chronic cor pulmonale   . Cardiomyopathy of undetermined type 09/07/2011  . Chronic kidney disease (CKD), stage IV (severe)   . Hyperlipdemia   . Obesity (BMI 30-39.9)   . Sleep apnea   . History of gout 09/07/2011  . Heart failure 7/13  . Shortness of breath   . Anemia   . Vascular disease   . Asthma    Past Surgical History  Procedure Date  . Bilateral oophorectomy   . Cardiac catheterization     right heart cath  .  Laparoscopic gastrostomy 12/21/2011    Procedure: LAPAROSCOPIC GASTROSTOMY;  Surgeon: Axel Filler, MD;  Location: [facility] OR;  Service: General;  Laterality: N/A;  laparoscopic gastrostomy possible open feeding tube    Medications: Prior to Admission medications   Medication Sig Start Date End Date Taking? Authorizing Provider  albuterol (PROVENTIL) (5 MG/ML) 0.5% nebulizer solution Take 0.5 mLs (2.5 mg total) by nebulization every 4 (four) hours as needed for wheezing or shortness of breath. 11/25/11  Yes William S Minor, NP  atropine 1 % ophthalmic solution Place 1 drop into both eyes 4 (four) times daily as needed. For secretions   Yes Historical Provider, MD  carvedilol (COREG) 12.5 MG tablet Take 12.5 mg by mouth 2 (two) times daily with a meal.   Yes Historical Provider, MD  cholecalciferol (VITAMIN D) 1000 UNITS tablet Take 2,000 Units by mouth daily.   Yes Historical Provider, MD  escitalopram (LEXAPRO) 5 MG tablet Take 7.5 mg by mouth daily.   Yes Historical Provider, MD  feeding supplement (PRO-STAT SUGAR FREE 64) LIQD Place 30 mLs into feeding tube 3 (three) times daily. 11/25/11  Yes William S Minor, NP  fluconazole (DIFLUCAN) 200 MG tablet Take 200 mg by mouth daily.   Yes Historical Provider, MD  hydrALAZINE (APRESOLINE) 25 MG tablet Take 50 mg by mouth every 8 (eight) hours. 10/07/11 10/06/12 Yes Amy D Clegg, NP  ipratropium-albuterol (DUONEB) 0.5-2.5 (3) MG/3ML SOLN Take 3 mLs by nebulization every 6 (six) hours as needed. For shortness of breath   Yes Historical Provider, MD  Multiple Vitamin (MULTIVITAMIN) LIQD Place  5 mLs into feeding tube daily. 11/25/11  Yes William S Minor, NP  nitroGLYCERIN (NITRODUR - DOSED IN MG/24 HR) 0.2 mg/hr Place 1 patch onto the skin daily. Also has a 0.4mg  patch   Yes Historical Provider, MD  nitroGLYCERIN (NITRODUR - DOSED IN MG/24 HR) 0.4 mg/hr Place 1 patch onto the skin daily. Also has a 0.2mg  patch   Yes Historical Provider, MD  pantoprazole  (PROTONIX) 40 MG tablet Take 40 mg by mouth 2 (two) times daily.   Yes Historical Provider, MD  potassium chloride 20 MEQ/15ML (10%) SOLN Take 40 mEq by mouth daily.   Yes Historical Provider, MD  pravastatin (PRAVACHOL) 40 MG tablet Take 40 mg by mouth daily.   Yes Historical Provider, MD  scopolamine (TRANSDERM-SCOP) 1.5 MG Place 1 patch onto the skin every 3 (three) days.   Yes Historical Provider, MD  senna (SENOKOT) 8.6 MG TABS Take 1 tablet by mouth 2 (two) times daily as needed. For constipation   Yes Historical Provider, MD  torsemide (DEMADEX) 20 MG tablet Take 20 mg by mouth daily.    Yes Historical Provider, MD    Allergies:   Allergies  Allergen Reactions  . Sulfonamide Derivatives Hives and Rash    Social History:  reports that she quit smoking about 3 months ago. Her smoking use included Cigarettes. She has a 55 pack-year smoking history. She has never used smokeless tobacco. She reports that she does not drink alcohol or use illicit drugs. resides at [facility]. She is on home oxygen. She is a wheelchair. and is in the has a feeding tube.  Family History: Family History  Problem Relation Age of Onset  . Heart attack Father   . Stroke Mother     CVA  . Coronary artery disease Daughter     Physical Exam: Filed Vitals:   12/27/11 1730 12/27/11 1745 12/27/11 1815 12/27/11 2003  BP: 156/69 143/68 148/98 150/61  Pulse: 69 61 59 61  Temp:    98.9 F (37.2 C)  TempSrc:    Oral  Resp: 22 24 16 18   SpO2: 100% 100% 100% 98%    General:  Alert and oriented times three, well developed and nourished, no acute distress Eyes: PERRLA, pink conjunctiva, no scleral icterus ENT: Moist oral mucosa, neck supple, no thyromegaly, trach in place Lungs: clear to ascultation, no wheeze, no crackles, no use of accessory muscles Cardiovascular: regular rate and rhythm, no regurgitation, no gallops, no murmurs. No carotid bruits, no JVD Abdomen: soft, positive BS,  non-tender, non-distended, no organomegaly, not an acute abdomen, PEG tube in left upper quadrant with surrounding cellulitis and drainage GU: not examined Neuro: CN II - XII grossly intact, sensation intact Musculoskeletal: strength 5/5 all extremities, no clubbing, cyanosis or edema Skin: no rash, no subcutaneous crepitation, no decubitus Psych: appropriate patient   Labs on Admission:   [facility] 12/27/11 1809  NA 137  K 4.5  CL 100  CO2 34*  GLUCOSE 103*  BUN 59*  CREATININE 2.02*  CALCIUM 9.3  MG --  PHOS --    Basename 12/27/11 1809  AST 16  ALT 14  ALKPHOS 68  BILITOT 0.2*  PROT 6.3  ALBUMIN 2.3*   No results found for this basename: LIPASE:2,AMYLASE:2 in the last 72 hours  Basename 12/27/11 1809  WBC 12.0*  NEUTROABS 9.0*  HGB 9.3*  HCT 29.7*  MCV 82.5  PLT 262    Micro Results: Results for CARLA, RASHAD (MRN [id number])  as of 12/27/2011 21:05  Ref. Range 11/26/2011 06:50  Color, Urine Latest Range: YELLOW  YELLOW  APPearance Latest Range: CLEAR  CLEAR  Specific Gravity, Urine Latest Range: 1.005-1.030  1.017  pH Latest Range: 5.0-8.0  7.5  Glucose Latest Range: NEGATIVE mg/dL NEGATIVE  Bilirubin Urine Latest Range: NEGATIVE  SMALL (A)  Ketones, ur Latest Range: NEGATIVE mg/dL NEGATIVE  Protein Latest Range: NEGATIVE mg/dL >784 (A)  Urobilinogen, UA Latest Range: 0.0-1.0 mg/dL 1.0  Nitrite Latest Range: NEGATIVE  NEGATIVE  Leukocytes, UA Latest Range: NEGATIVE  MODERATE (A)  Hgb urine dipstick Latest Range: NEGATIVE  SMALL (A)  WBC, UA Latest Range: <3 WBC/hpf 11-20  RBC / HPF Latest Range: <3 RBC/hpf 3-6  Squamous Epithelial / LPF Latest Range: RARE  FEW (A)  Bacteria, UA Latest Range: RARE  FEW (A)  Casts Latest Range: NEGATIVE  GRANULAR CAST (A)     Radiological Exams on Admission: Dg Abd Acute W/chest  12/27/2011  *RADIOLOGY REPORT*  Clinical Data: Abdominal pain.  Discharge from peg tube.  Shortness of breath.  Diabetes.  Hypertension.   ACUTE ABDOMEN SERIES (ABDOMEN 2 VIEW & CHEST 1 VIEW)  Comparison: 12/20/2011  Findings: Tracheostomy tube projects over the tracheal air shadow.  Stable appearance of cardiomegaly with apparent volume loss in the left lung causing leftward deviation of cardiac and mediastinal contours.  A band of opacity is present in the left upper lobe just above the cardiac shadow, and there is complete obscuration of the left hemidiaphragm, presumably from left lower lobe airspace opacity.  Left side down lateral decubitus view of the abdomen demonstrates air fluid levels in the colon and mildly dilated loops of left abdominal small bowel.  There is evidence of chronic bilateral sacroiliitis.  Lower lumbar spondylosis noted.  Vascular calcifications are present in the upper abdomen.  IMPRESSION:  1.  Cardiomegaly. 2.  Suspected atelectasis in the left lower lobe and lingula, causing leftward shift of cardiac and mediastinal structures. Entities such as a central obstructing lesion cannot be excluded, and chest CT should be considered if this atelectasis persists. 3.  Air-fluid levels and loops of large bowel and mildly dilated small bowel, abnormal but technically nonspecific.  The appearance could reflect ileus or low-level distal obstruction. 4.  Chronic bilateral sacroiliitis. 5.  Lower lumbar spondylosis.   Original Report Authenticated By: Gaylyn Rong, M.D.     Assessment/Plan Present on Admission:  . Cellulitis surrounding PEG Bring for 23 hour observation status IV clindamycin  We'll repeat a KUB in a.m. Status post PEG/dysphagia  PEG feeding continued  . Sleep apnea, status post trach  . C O P D Chronic respiratory failure  Duo nebs ordered when necessary Oxygen she says greater than 88% . Type 2 diabetes mellitus with vascular disease . Hyperlipdemia . Obesity (BMI 30-39.9) . Chronic kidney disease (CKD), stage IV (severe)  resume home medications ADA diet and sliding scale insulin Bedbound  due to deconditioning  Full code DVT prophylaxis   Kristain Hu 12/27/2011, 9:00 PM

## 2011-12-28 ENCOUNTER — Encounter (HOSPITAL_COMMUNITY): Payer: Self-pay | Admitting: General Practice

## 2011-12-28 ENCOUNTER — Observation Stay (HOSPITAL_COMMUNITY): Payer: Medicare Other

## 2011-12-28 DIAGNOSIS — L03311 Cellulitis of abdominal wall: Secondary | ICD-10-CM

## 2011-12-28 LAB — BASIC METABOLIC PANEL
CO2: 32 mEq/L (ref 19–32)
GFR calc non Af Amer: 24 mL/min — ABNORMAL LOW (ref >90)
Glucose, Bld: 102 mg/dL — ABNORMAL HIGH (ref 70–99)
Potassium: 4 mEq/L (ref 3.5–5.1)
Sodium: 138 mEq/L (ref 135–145)

## 2011-12-28 LAB — URINE MICROSCOPIC-ADD ON

## 2011-12-28 LAB — CBC
Hemoglobin: 8.9 g/dL — ABNORMAL LOW (ref 12.0–15.0)
MCH: 25.2 pg — ABNORMAL LOW (ref 26.0–34.0)
MCHC: 31 g/dL (ref 30.0–36.0)

## 2011-12-28 LAB — URINALYSIS, ROUTINE W REFLEX MICROSCOPIC
Glucose, UA: NEGATIVE mg/dL
Hgb urine dipstick: NEGATIVE
Leukocytes, UA: NEGATIVE
Protein, ur: >300 mg/dL — AB
Specific Gravity, Urine: 1.017 (ref 1.005–1.030)
Urobilinogen, UA: 0.2 mg/dL (ref 0.0–1.0)

## 2011-12-28 LAB — GLUCOSE, CAPILLARY
Glucose-Capillary: 89 mg/dL (ref 70–99)
Glucose-Capillary: 91 mg/dL (ref 70–99)
Glucose-Capillary: 124 mg/dL — ABNORMAL HIGH (ref 70–99)

## 2011-12-28 LAB — PREALBUMIN: Prealbumin: 15.8 mg/dL — ABNORMAL LOW (ref 17.0–34.0)

## 2011-12-28 MED ORDER — SENNOSIDES 8.8 MG/5ML PO SYRP
10.0000 mL | ORAL_SOLUTION | Freq: Every day | ORAL | Status: DC
  Administered 2011-12-28: 10 mL via ORAL
  Filled 2011-12-28 (×2): qty 10

## 2011-12-28 MED ORDER — IOHEXOL 300 MG/ML  SOLN
20.0000 mL | INTRAMUSCULAR | Status: AC
  Administered 2011-12-28 (×2): 20 mL via ORAL

## 2011-12-28 MED ORDER — BIOTENE DRY MOUTH MT LIQD: 15.0000 mL | OROMUCOSAL | Status: DC | PRN

## 2011-12-28 MED ORDER — ALLOPURINOL 20 MG/ML ORAL SUSPENSION
100.0000 mg | Freq: Every day | ORAL | Status: DC
  Filled 2011-12-28: qty 5

## 2011-12-28 MED ORDER — ALLOPURINOL 100 MG PO TABS
100.0000 mg | ORAL_TABLET | Freq: Every day | ORAL | Status: DC
  Administered 2011-12-28: 100 mg
  Filled 2011-12-28 (×3): qty 1

## 2011-12-28 MED ORDER — INFLUENZA VIRUS VACC SPLIT PF IM SUSP
0.5000 mL | INTRAMUSCULAR | Status: AC
  Administered 2011-12-29: 0.5 mL via INTRAMUSCULAR
  Filled 2011-12-28: qty 0.5

## 2011-12-28 MED ORDER — OSMOLITE 1.5 CAL PO LIQD
1000.0000 mL | ORAL | Status: DC
  Administered 2011-12-28: 1000 mL
  Filled 2011-12-28 (×3): qty 1000

## 2011-12-28 MED ORDER — FREE WATER
125.0000 mL | Status: DC
  Administered 2011-12-28 – 2011-12-29 (×5): 125 mL

## 2011-12-28 MED ORDER — PANTOPRAZOLE SODIUM 40 MG PO PACK
40.0000 mg | PACK | Freq: Two times a day (BID) | ORAL | Status: DC
  Administered 2011-12-28 (×2): 40 mg
  Filled 2011-12-28 (×4): qty 20

## 2011-12-28 MED ORDER — VITAMIN D3 25 MCG (1000 UNIT) PO TABS
2000.0000 [IU] | ORAL_TABLET | Freq: Every day | ORAL | Status: DC
  Administered 2011-12-28: 2000 [IU] via ORAL
  Filled 2011-12-28 (×2): qty 2

## 2011-12-28 MED ORDER — VANCOMYCIN HCL 500 MG IV SOLR
500.0000 mg | INTRAVENOUS | Status: DC
  Administered 2011-12-28 – 2011-12-30 (×3): 500 mg via INTRAVENOUS
  Filled 2011-12-28 (×6): qty 500

## 2011-12-28 MED ORDER — SCOPOLAMINE 1 MG/3DAYS TD PT72
1.0000 | MEDICATED_PATCH | TRANSDERMAL | Status: DC
  Administered 2011-12-28 – 2011-12-31 (×2): 1.5 mg via TRANSDERMAL
  Filled 2011-12-28 (×2): qty 1

## 2011-12-28 MED ORDER — NEPRO/CARBSTEADY PO LIQD
1000.0000 mL | ORAL | Status: DC
  Administered 2011-12-28: 1000 mL via ORAL
  Filled 2011-12-28 (×2): qty 1000

## 2011-12-28 NOTE — Progress Notes (Signed)
CSW sent updated clinicals to [facility] of [place]. CSW will follow up with patient in the am to complete a comprehensive psychosocial.   Sabino Niemann, MSW, Theresia Majors [phone]

## 2011-12-28 NOTE — Progress Notes (Signed)
ANTIBIOTIC CONSULT NOTE - INITIAL  Pharmacy Consult for vancomycin Indication: cellulitis  Allergies  Allergen Reactions  . Sulfonamide Derivatives Hives and Rash    Patient Measurements: Height: 5\' 4"  (162.6 cm) Weight: 160 lb 4.4 oz (72.7 kg) (bed scale) IBW/kg (Calculated) : 54.7    Vital Signs: Temp: 99.5 F (37.5 C) (11/05 0618) Temp src: Oral (11/05 0618) BP: 149/74 mmHg (11/05 0618) Pulse Rate: 54  (11/05 0800) Intake/Output from previous day: 11/04 0701 - 11/05 0700 In: 143 [I.V.:3; IV Piggyback:50] Out: 500 [Urine:500] Intake/Output from this shift:    Labs:  [facility] 12/28/11 0530 12/27/11 1809  WBC 13.3* 12.0*  HGB 8.9* 9.3*  PLT 252 262  LABCREA -- --  CREATININE 2.00* 2.02*   Estimated Creatinine Clearance: 25.2 ml/min (by C-G formula based on Cr of 2). No results found for this basename: VANCOTROUGH:2,VANCOPEAK:2,VANCORANDOM:2,GENTTROUGH:2,GENTPEAK:2,GENTRANDOM:2,TOBRATROUGH:2,TOBRAPEAK:2,TOBRARND:2,AMIKACINPEAK:2,AMIKACINTROU:2,AMIKACIN:2, in the last 72 hours   Microbiology: No results found for this or any previous visit (from the past 720 hour(s)).  Medical History: Past Medical History  Diagnosis Date  . COPD (chronic obstructive pulmonary disease)     on home o2  . Chronic renal insufficiency   . Type II or unspecified type diabetes mellitus without mention of complication, not stated as uncontrolled   . CVA (cerebral infarction)     hx  . Hypertensive heart disease with congestive heart failure   . Chronic cor pulmonale   . Cardiomyopathy of undetermined type 09/07/2011  . Chronic kidney disease (CKD), stage IV (severe)   . Hyperlipdemia   . Obesity (BMI 30-39.9)   . Sleep apnea   . History of gout 09/07/2011  . Heart failure 7/13  . Shortness of breath   . Anemia   . Vascular disease   . Asthma     Medications:  Prescriptions prior to admission  Medication Sig Dispense Refill  . albuterol (PROVENTIL) (5 MG/ML) 0.5% nebulizer  solution Take 0.5 mLs (2.5 mg total) by nebulization every 4 (four) hours as needed for wheezing or shortness of breath.  20 mL    . atropine 1 % ophthalmic solution Place 1 drop into both eyes 4 (four) times daily as needed. For secretions      . carvedilol (COREG) 12.5 MG tablet Take 12.5 mg by mouth 2 (two) times daily with a meal.      . cholecalciferol (VITAMIN D) 1000 UNITS tablet Take 2,000 Units by mouth daily.      Marland Kitchen escitalopram (LEXAPRO) 5 MG tablet Take 7.5 mg by mouth daily.      . feeding supplement (PRO-STAT SUGAR FREE 64) LIQD Place 30 mLs into feeding tube 3 (three) times daily.  900 mL    . fluconazole (DIFLUCAN) 200 MG tablet Take 200 mg by mouth daily.      . hydrALAZINE (APRESOLINE) 25 MG tablet Take 50 mg by mouth every 8 (eight) hours.      Marland Kitchen ipratropium-albuterol (DUONEB) 0.5-2.5 (3) MG/3ML SOLN Take 3 mLs by nebulization every 6 (six) hours as needed. For shortness of breath      . Multiple Vitamin (MULTIVITAMIN) LIQD Place 5 mLs into feeding tube daily.      . nitroGLYCERIN (NITRODUR - DOSED IN MG/24 HR) 0.2 mg/hr Place 1 patch onto the skin daily. Also has a 0.4mg  patch      . nitroGLYCERIN (NITRODUR - DOSED IN MG/24 HR) 0.4 mg/hr Place 1 patch onto the skin daily. Also has a 0.2mg  patch      . pantoprazole (PROTONIX)  40 MG tablet Take 40 mg by mouth 2 (two) times daily.      . potassium chloride 20 MEQ/15ML (10%) SOLN Take 40 mEq by mouth daily.      . pravastatin (PRAVACHOL) 40 MG tablet Take 40 mg by mouth daily.      Marland Kitchen scopolamine (TRANSDERM-SCOP) 1.5 MG Place 1 patch onto the skin every 3 (three) days.      Marland Kitchen senna (SENOKOT) 8.6 MG TABS Take 1 tablet by mouth 2 (two) times daily as needed. For constipation      . torsemide (DEMADEX) 20 MG tablet Take 20 mg by mouth daily.        Assessment: 71 yo lady to start vancomycin for abdominal wall cellulitis around feeding tube.    Goal of Therapy:  Vancomycin trough level 10-15 mcg/ml  Plan:  Vancomycin 500 mg IV  q24 hours. F/U cultures, renal function and clinical course. Check vanc trough when appropriate.  Talbert Cage Poteet 12/28/2011,8:26 AM

## 2011-12-28 NOTE — Progress Notes (Signed)
Utilization Review Completed.   Skylynn Burkley, RN, BSN Nurse Case Manager  [phone]  

## 2011-12-28 NOTE — Progress Notes (Signed)
[facility] PROGRESS NOTE  [name] WUJ:[id number] DOB: 1940-03-09 DOA: 12/27/2011 PCP: Gwynneth Aliment, MD  Assessment/Plan: Abdominal wall cellulitis -Approximately 3-4 cm induration around gastrostomy tube site with mild erythema -CT abdomen rule out abdominal wall fluid collection -ask oral contrast to be infused thru G-tube to verify G-tube placement -Discontinue clindamycin -Start vancomycin for staphylococcal infection more common with recent laparoscopic G-tube placement on December 21, 2011 Leukocytosis -Likely due to cellulitis -Blood cultures drawn last night -Check UA/urine culture Chronic respiratory failure -Status post tracheostomy (October 1) with supplemental oxygen -Multifactorial due to cor pulmonale, obstructive sleep apnea, chronic CHF -Start patient back on scopolamine patch do to secretions Chronic systolic and diastolic heart failure -Compensated at this time -Ejection fraction 25-30% -Continue Demadex 20 mg daily -Continue carvedilol Dysphagia -Status post laparoscopic gastrostomy tube placement 12/21/2011 -Restart Nepro 30 cc/hr with free water flushes -Asked nutrition to see if any adjustments need to be made -Check pre-albumin Chronic kidney disease stage IV -Baseline creatinine between 1.6-1.9 Questionable diabetes mellitus type 2 -Hemoglobin A1c 4.2 on 11/26/2011  -Will recheck -Check lipids to see if any needed adjustment in statin -Restart statin Hypertension -Continue carvedilol, hydralazine   Antibiotics:  Vancomycin November 4>>>    Procedures/Studies: Ir Fluoro Rm 30-60 Min  2012-01-05  *RADIOLOGY REPORT*  Clinical Data: Dysphasia, in need of gastrostomy tube placement for supplementation of nutrition.  IR FLOURO RM 0-60 MIN  Comparison: Abdominal radiograph - earlier same day; abdominal CT - 08/07/2008  Findings:  Preprocedural spot radiograph demonstrates redundant transverse colon overlying the expected location  of the gastric fundus.  Ultrasound scanning was performed to demarcate the lateral and of the left lobe of the liver.  Scissors were placed to demarcate the desired location for gastrostomy tube placement.  Despite the insufflation of the stomach with air, the redundant transverse colon remained overlying the percutaneous access site. As such, the procedure was cancelled.  IMPRESSION: Cancelled percutaneous gastrostomy tube placement secondary to interposed transverse colon.  Would recommend surgical consultation for surgically placed gastrostomy tube.  Above findings discussed with Dr. Elias Else at the time of procedure cancellation.   Original Report Authenticated By: Waynard Reeds, M.D.    Dg Chest Portable 1 View  12/20/2011  *RADIOLOGY REPORT*  Clinical Data: Congestive heart failure  PORTABLE CHEST - 1 VIEW  Comparison: 11/30/2011  Findings: Tracheostomy appears well positioned.  Soft feeding tube has its tip in the distal esophagus.  There is cardiomegaly.  There is left lower lobe collapse and there is partial collapse in the right lower lobe.  There is probably pleural fluid on the left.  IMPRESSION: Feeding tube tip only in the distal esophagus.  This finding is being made  a call report.  Worsened basilar volume loss, more extensive on the left than the right.   Original Report Authenticated By: Thomasenia Sales, M.D.    Dg Chest Portable 1 View  11/30/2011  *RADIOLOGY REPORT*  Clinical Data: Respiratory failure.  PORTABLE CHEST - 1 VIEW  Comparison: 11/26/2011  Findings: Tracheostomy stable in position.  Nasogastric tube extends beyond the  inferior aspect of the film.  Support apparatus projects over the right apex.  Mild cardiomegaly.  Possible small left pleural effusion. No pneumothorax.  No congestive failure.  Mild right infrahilar volume loss.  Improved left upper lobe aeration.  Persistent left lower lobe airspace disease.  IMPRESSION:  1.  Partial clearing of left upper lobe airspace  disease. 2.  Similar probable small left pleural  effusion with adjacent atelectasis or infection. 3. Cardiomegaly without congestive failure.   Original Report Authenticated By: Consuello Bossier, M.D.    Dg Abd Acute W/chest  12/27/2011  *RADIOLOGY REPORT*  Clinical Data: Abdominal pain.  Discharge from peg tube.  Shortness of breath.  Diabetes.  Hypertension.  ACUTE ABDOMEN SERIES (ABDOMEN 2 VIEW & CHEST 1 VIEW)  Comparison: 12/20/2011  Findings: Tracheostomy tube projects over the tracheal air shadow.  Stable appearance of cardiomegaly with apparent volume loss in the left lung causing leftward deviation of cardiac and mediastinal contours.  A band of opacity is present in the left upper lobe just above the cardiac shadow, and there is complete obscuration of the left hemidiaphragm, presumably from left lower lobe airspace opacity.  Left side down lateral decubitus view of the abdomen demonstrates air fluid levels in the colon and mildly dilated loops of left abdominal small bowel.  There is evidence of chronic bilateral sacroiliitis.  Lower lumbar spondylosis noted.  Vascular calcifications are present in the upper abdomen.  IMPRESSION:  1.  Cardiomegaly. 2.  Suspected atelectasis in the left lower lobe and lingula, causing leftward shift of cardiac and mediastinal structures. Entities such as a central obstructing lesion cannot be excluded, and chest CT should be considered if this atelectasis persists. 3.  Air-fluid levels and loops of large bowel and mildly dilated small bowel, abnormal but technically nonspecific.  The appearance could reflect ileus or low-level distal obstruction. 4.  Chronic bilateral sacroiliitis. 5.  Lower lumbar spondylosis.   Original Report Authenticated By: Gaylyn Rong, M.D.    Dg Abd Portable 1v  12/14/2011  *RADIOLOGY REPORT*  Clinical Data: Feeding tube placement  PORTABLE ABDOMEN - 1 VIEW  Comparison: Abdominal film 12/14/2011  Findings: There is a feeding tube with  weighted tip in the gastric body.  Barium within the colon.  IMPRESSION: Feeding tube with tip in the stomach.   Original Report Authenticated By: Genevive Bi, M.D.    Dg Abd Portable 1v  12/14/2011  *RADIOLOGY REPORT*  Clinical Data: Evaluate barium prior to potential percutaneous gastrostomy tube placement  PORTABLE ABDOMEN - 1 VIEW  Comparison: 12/02/2011  Findings:  Administered enteric contrast is seen within the cecum and ascending colon to the level of the hepatic flexure.  Air is seen within the transverse colon to the level of the splenic flexure.  Nonobstructive bowel gas pattern.  Evaluation for pneumoperitoneum is degraded secondary to supine patient positioned exclusion of the lower thorax.  An enteric tube overlies the left upper abdominal quadrant.  Lumbar spine degenerative change.  IMPRESSION: Enteric contrast seen within the cecum and ascending colon to the level of the hepatic flexure.   Original Report Authenticated By: Waynard Reeds, M.D.    Dg Abd Portable 1v  12/02/2011  *RADIOLOGY REPORT*  Clinical Data: Verify enteric tube placement  PORTABLE ABDOMEN - 1 VIEW  Comparison: 11/27/2011  Findings:  A weighted tip enteric tube tip projects over the expected location of the gastric fundus.  Interval removal of nasogastric tube.  Evaluation of the bowel gas is pattern is degraded secondary to positioning and patient motion artifact.  There is apparent mild gaseous distension of multiple loops of bowel.  Evaluation for pneumoperitoneum is limited secondary to patient motion artifact and exclusion of the lower thorax.  Multilevel lumbar spine degenerative change.  IMPRESSION: Weighted enteric tube tip overlies the expected location of the gastric fundus.   Original Report Authenticated By: Judene Companion.D.  Subjective: Patient denies any fevers, chills, vomiting, diarrhea, chest pain, shortness of breath, rashes or headache, dizziness. She complains of abdominal  pain which is a little bit better than yesterday.  Objective: Filed Vitals:   12/28/11 0008 12/28/11 0335 12/28/11 0500 12/28/11 0618  BP: 153/79  149/74 149/74  Pulse: 62 61 61 61  Temp: 97.5 F (36.4 C)   99.5 F (37.5 C)  TempSrc: Oral   Oral  Resp: 20 20  20   Height:      Weight:      SpO2: 98% 97%      Intake/Output Summary (Last 24 hours) at 12/28/11 0813 Last data filed at 12/28/11 0630  Gross per 24 hour  Intake    143 ml  Output    500 ml  Net   -357 ml   Weight change:  Exam:   General:  Pt is alert, follows commands appropriately, not in acute distress  HEENT: No icterus, No thrush, [place]/AT, tracheostomy tube in place  Cardiovascular: RRR, S1/S2, no rubs, no gallops  Respiratory: CTA bilaterally, no wheezing, no crackles, no rhonchi  Abdomen: Approximately 3-4 cm induration on the superior portion of gastrostomy tube without any crepitance. Minimal surrounding erythema. No necrosis. Scant yellow drainage noted  Extremities: 2+ edema, No lymphangitis, No petechiae, No rashes, no synovitis  Data Reviewed: Basic Metabolic Panel:  Lab 12/28/11 4540 12/27/11 1809 12/24/11 0709 12/23/11 0549 12/22/11 0534  NA 138 137 138 137 136  K 4.0 4.5 4.4 4.4 4.4  CL 100 100 99 99 97  CO2 32 34* 32 30 28  GLUCOSE 102* 103* 101* 110* 87  BUN 60* 59* 73* 71* 63*  CREATININE 2.00* 2.02* 2.30* 2.64* 2.25*  CALCIUM 9.3 9.3 9.2 9.2 9.2  MG -- -- -- -- --  PHOS -- -- -- -- --   Liver Function Tests:  Lab 12/27/11 1809  AST 16  ALT 14  ALKPHOS 68  BILITOT 0.2*  PROT 6.3  ALBUMIN 2.3*   No results found for this basename: LIPASE:5,AMYLASE:5 in the last 168 hours No results found for this basename: AMMONIA:5 in the last 168 hours CBC:  Lab 12/28/11 0530 12/27/11 1809 12/23/11 0549  WBC 13.3* 12.0* 10.1  NEUTROABS -- 9.0* --  HGB 8.9* 9.3* 8.9*  HCT 28.7* 29.7* 29.0*  MCV 81.3 82.5 82.4  PLT 252 262 284   Cardiac Enzymes: No results found for this basename:  CKTOTAL:5,CKMB:5,CKMBINDEX:5,TROPONINI:5 in the last 168 hours BNP: No components found with this basename: POCBNP:5 CBG:  Lab 12/21/11 1218 12/21/11 0920  GLUCAP 98 85    No results found for this or any previous visit (from the past 240 hour(s)).   Scheduled Meds:   . allopurinol  100 mg Oral Daily  . carvedilol  12.5 mg Per Tube BID WC  . cholecalciferol  2,000 Units Oral Daily  . [COMPLETED] clindamycin (CLEOCIN) IV  900 mg Intravenous Once  . escitalopram  7.5 mg Oral Daily  . feeding supplement  30 mL Per Tube TID  . free water  125 mL Per Tube Q4H  . heparin  5,000 Units Subcutaneous Q8H  . hydrALAZINE  50 mg Oral Q8H  . nitroGLYCERIN  0.2 mg Transdermal Daily  . pantoprazole  40 mg Oral BID  . scopolamine  1 patch Transdermal Q72H  . sennosides  10 mL Oral QHS  . torsemide  20 mg Oral Daily  . [DISCONTINUED] clindamycin (CLEOCIN) IV  300 mg Intravenous Q8H   Continuous  Infusions:   . feeding supplement (NEPRO CARB STEADY)       Krysta Bloomfield, DO  [facility] Pager [phone]  If 7PM-7AM, please contact night-coverage [email or web address] Password [email or web address] 12/28/2011, 8:13 AM   LOS: 1 day

## 2011-12-28 NOTE — Progress Notes (Signed)
INITIAL ADULT NUTRITION ASSESSMENT Date: 12/28/2011   Time: 10:05 AM Reason for Assessment: Consult -TF initiation and management   INTERVENTION: 1. .Initiate Osmolite 1.5 @ 30 ml/hr via PEG and increase by 10 ml every 4 hours to goal rate of 50 ml/hr.  At goal rate, tube feeding regimen will provide 1800 kcal, 75 grams of protein, and 914 ml of H2O.  2. Continue free water flushes of 125 ml q 4 hrs to provide a total of 1664 ml free water daily.  3. RD will continue to follow    DOCUMENTATION CODES Per approved criteria  -Severe  malnutrition in the context of social or environmental circumstances    ASSESSMENT: Female 71 y.o.  Dx: abd pain   Hx:  Past Medical History  Diagnosis Date  . COPD (chronic obstructive pulmonary disease)     on home o2  . Chronic renal insufficiency   . Type II or unspecified type diabetes mellitus without mention of complication, not stated as uncontrolled   . CVA (cerebral infarction)     hx  . Hypertensive heart disease with congestive heart failure   . Chronic cor pulmonale   . Cardiomyopathy of undetermined type 09/07/2011  . Chronic kidney disease (CKD), stage IV (severe)   . Hyperlipdemia   . Obesity (BMI 30-39.9)   . Sleep apnea   . History of gout 09/07/2011  . Heart failure 7/13  . Shortness of breath   . Anemia   . Vascular disease   . Asthma     Past Surgical History  Procedure Date  . Bilateral oophorectomy   . Cardiac catheterization     right heart cath  . Laparoscopic gastrostomy 12/21/2011    Procedure: LAPAROSCOPIC GASTROSTOMY;  Surgeon: Axel Filler, MD;  Location: [facility] OR;  Service: General;  Laterality: N/A;  laparoscopic gastrostomy possible open feeding tube    Related Meds:     . allopurinol  100 mg Oral Daily  . carvedilol  12.5 mg Per Tube BID WC  . cholecalciferol  2,000 Units Oral Daily  . [COMPLETED] clindamycin (CLEOCIN) IV  900 mg Intravenous Once  . escitalopram  7.5 mg Oral Daily  . feeding  supplement  30 mL Per Tube TID  . free water  125 mL Per Tube Q4H  . heparin  5,000 Units Subcutaneous Q8H  . hydrALAZINE  50 mg Oral Q8H  . nitroGLYCERIN  0.2 mg Transdermal Daily  . pantoprazole  40 mg Oral BID  . scopolamine  1 patch Transdermal Q72H  . sennosides  10 mL Oral QHS  . torsemide  20 mg Oral Daily  . vancomycin  500 mg Intravenous Q24H  . [DISCONTINUED] clindamycin (CLEOCIN) IV  300 mg Intravenous Q8H     Ht: 5\' 4"  (162.6 cm)  Wt: 160 lb 4.4 oz (72.7 kg) (bed scale)  Ideal Wt: 54.5 kg  % Ideal Wt: 133%  Usual Wt:  Wt Readings from Last 10 Encounters:  12/27/11 160 lb 4.4 oz (72.7 kg)  11/25/11 188 lb 6.4 oz (85.458 kg)  10/07/11 219 lb 9.6 oz (99.61 kg)  09/16/11 246 lb 6 oz (111.755 kg)  09/16/11 246 lb 6 oz (111.755 kg)    % Usual Wt: 65%  Body mass index is 27.51 kg/(m^2). Pt is overweight per current BMI   Food/Nutrition Related Hx: home TF r/tt dysphagia   Labs:  CMP     Component Value Date/Time   NA 138 12/28/2011 0530   K 4.0 12/28/2011 0530  CL 100 12/28/2011 0530   CO2 32 12/28/2011 0530   GLUCOSE 102* 12/28/2011 0530   BUN 60* 12/28/2011 0530   CREATININE 2.00* 12/28/2011 0530   CALCIUM 9.3 12/28/2011 0530   PROT 6.3 12/27/2011 1809   ALBUMIN 2.3* 12/27/2011 1809   AST 16 12/27/2011 1809   ALT 14 12/27/2011 1809   ALKPHOS 68 12/27/2011 1809   BILITOT 0.2* 12/27/2011 1809   GFRNONAA 24* 12/28/2011 0530   GFRAA 28* 12/28/2011 0530     Sodium  Date/Time Value Range Status  12/28/2011  5:30 AM 138  135 - 145 mEq/L Final  12/27/2011  6:09 PM 137  135 - 145 mEq/L Final  12/24/2011  7:09 AM 138  135 - 145 mEq/L Final    Potassium  Date/Time Value Range Status  12/28/2011  5:30 AM 4.0  3.5 - 5.1 mEq/L Final  12/27/2011  6:09 PM 4.5  3.5 - 5.1 mEq/L Final  12/24/2011  7:09 AM 4.4  3.5 - 5.1 mEq/L Final    Phosphorus  Date/Time Value Range Status  12/21/2011  7:33 AM 4.4  2.3 - 4.6 mg/dL Final  21/30/8657  8:46 AM 4.3  2.3 - 4.6 mg/dL Final    96/29/5284  1:32 AM 3.5  2.3 - 4.6 mg/dL Final    Magnesium  Date/Time Value Range Status  12/13/2011  6:44 AM 2.2  1.5 - 2.5 mg/dL Final  44/02/270  5:36 AM 2.5  1.5 - 2.5 mg/dL Final  64/05/345  4:25 AM 2.1  1.5 - 2.5 mg/dL Final     Intake/Output Summary (Last 24 hours) at 12/28/11 1008 Last data filed at 12/28/11 0630  Gross per 24 hour  Intake    143 ml  Output    500 ml  Net   -357 ml     Diet Order:   none   Supplements/Tube Feeding:Pro-stat TID   IVF:    feeding supplement (NEPRO CARB STEADY)    Estimated Nutritional Needs:   Kcal: 1780-1900 Protein: 43-57 gm  Fluid: 1.7-1.9 L  Pt with recent admission to [facility] requiring intubation. Post extubation pt with dysphagia, had PEG placed for nutrition 10/29. Pt was at SNF when noticed that PEG site was red. Pt has trach, not vent support.  RD consulted for nutrition management of EN.  Pt previous TF regimen was Nepro @ 30 ml/hr and 30 ml Pro-stat via tube TID, also 125 ml free water via tube q 4 hr. This was providing 1296 kcal, 103 gm protein, and 1267 ml free water.   Pt with CKD stage 5, not currently on HD.  Pt with ongoing weight loss, 86 lbs (35% body weight) over the past 4 months. Severe weight loss. Some weight loss is desirable for this pt, but would recommend a more gradual weight loss.  Current TF regimen is not meeting kcal needs and providing >100% estimated protein needs. RD will adjust rate and formula to better meet needs.   Pt meets criteria for severe malnutrition in the context of social or environmental circumstances 2/2 weight loss of 35% in 4 months and meeting less then 75% estimated nutrition needs for > 1 month.  NUTRITION DIAGNOSIS: Inadequate oral intake r/t dysphagia AEB NPO with PEG   MONITORING/EVALUATION(Goals): Goal: EN to meet >/=90% estimated protein and kcal needs  Monitor: EN tolerance, weight, labs  EDUCATION NEEDS: -No education needs identified at this time    Clarene Duke RD, LDN Pager [phone] After Hours pager [phone]  12/28/2011, 10:05  AM

## 2011-12-29 DIAGNOSIS — L0390 Cellulitis, unspecified: Secondary | ICD-10-CM

## 2011-12-29 DIAGNOSIS — N189 Chronic kidney disease, unspecified: Secondary | ICD-10-CM

## 2011-12-29 DIAGNOSIS — J9600 Acute respiratory failure, unspecified whether with hypoxia or hypercapnia: Secondary | ICD-10-CM

## 2011-12-29 DIAGNOSIS — I5023 Acute on chronic systolic (congestive) heart failure: Secondary | ICD-10-CM

## 2011-12-29 DIAGNOSIS — N179 Acute kidney failure, unspecified: Secondary | ICD-10-CM

## 2011-12-29 LAB — CBC WITH DIFFERENTIAL/PLATELET
Eosinophils Absolute: 0.2 10*3/uL (ref 0.0–0.7)
Eosinophils Relative: 2 % (ref 0–5)
HCT: 28.1 % — ABNORMAL LOW (ref 36.0–46.0)
Hemoglobin: 9.1 g/dL — ABNORMAL LOW (ref 12.0–15.0)
Lymphocytes Relative: 20 % (ref 12–46)
Lymphs Abs: 1.8 10*3/uL (ref 0.7–4.0)
MCH: 26.8 pg (ref 26.0–34.0)
MCV: 82.9 fL (ref 78.0–100.0)
Monocytes Relative: 9 % (ref 3–12)
RBC: 3.39 MIL/uL — ABNORMAL LOW (ref 3.87–5.11)
WBC: 9.2 10*3/uL (ref 4.0–10.5)

## 2011-12-29 LAB — BASIC METABOLIC PANEL
BUN: 57 mg/dL — ABNORMAL HIGH (ref 6–23)
CO2: 32 mEq/L (ref 19–32)
Calcium: 8.9 mg/dL (ref 8.4–10.5)
GFR calc non Af Amer: 23 mL/min — ABNORMAL LOW (ref >90)
Glucose, Bld: 103 mg/dL — ABNORMAL HIGH (ref 70–99)

## 2011-12-29 LAB — HEMOGLOBIN A1C: Mean Plasma Glucose: 80 mg/dL (ref <117)

## 2011-12-29 LAB — LIPID PANEL
Cholesterol: 124 mg/dL (ref 0–200)
HDL: 30 mg/dL — ABNORMAL LOW (ref >39)
Total CHOL/HDL Ratio: 4.1 RATIO

## 2011-12-29 LAB — GLUCOSE, CAPILLARY
Glucose-Capillary: 92 mg/dL (ref 70–99)
Glucose-Capillary: 90 mg/dL (ref 70–99)

## 2011-12-29 NOTE — Progress Notes (Signed)
Quick note:  Delayed entry from 0930: Called by Dr. Lavera Guise regarding [name] tube leaking at the incision site.  Discussed with Dr. Michaell Cowing who recommended we stop the tube feeds for 1-2 days and put to gravity.  A new G tube can not be placed until 4 weeks after the previous one as per IR protocols.

## 2011-12-29 NOTE — Progress Notes (Signed)
[facility] PROGRESS NOTE  [name] XBM:[id number] DOB: 1940-08-21 DOA: 12/27/2011 PCP: Gwynneth Aliment, MD  Assessment/Plan: Abdominal wall cellulitis -Approximately 3-4 cm induration around gastrostomy tube site with mild erythema -CT abdomen rule out abdominal wall fluid collection - D/w CCS on 11/6 and plan is to place TF on hold and then resume them in 24 hrs.  -Start vancomycin for staphylococcal infection more common with recent laparoscopic G-tube placement on December 21, 2011 Leukocytosis -Likely due to cellulitis Chronic respiratory failure due to OSA/OHS/ -Status post tracheostomy (October 1) with supplemental oxygen -Start patient back on scopolamine patch do to secretions Chronic systolic and diastolic heart failure -Compensated at this time -Ejection fraction 25-30% -Dysphagia -Status post laparoscopic gastrostomy tube placement 12/21/2011 -Patient leaking all TF around PEG site -Asked nutrition to see if any adjustments need to be made Chronic kidney disease stage IV -Baseline creatinine between 1.6-1.9 Questionable diabetes mellitus type 2 -Hemoglobin A1c 4.2 on 11/26/2011  Hypertension    Antibiotics:  Vancomycin November 4>>>    Procedures/Studies: Ir Fluoro Rm 30-60 Min  12-28-11  *RADIOLOGY REPORT*  Clinical Data: Dysphasia, in need of gastrostomy tube placement for supplementation of nutrition.  IR FLOURO RM 0-60 MIN  Comparison: Abdominal radiograph - earlier same day; abdominal CT - 08/07/2008  Findings:  Preprocedural spot radiograph demonstrates redundant transverse colon overlying the expected location of the gastric fundus.  Ultrasound scanning was performed to demarcate the lateral and of the left lobe of the liver.  Scissors were placed to demarcate the desired location for gastrostomy tube placement.  Despite the insufflation of the stomach with air, the redundant transverse colon remained overlying the percutaneous access site.  As such, the procedure was cancelled.  IMPRESSION: Cancelled percutaneous gastrostomy tube placement secondary to interposed transverse colon.  Would recommend surgical consultation for surgically placed gastrostomy tube.  Above findings discussed with Dr. Elias Else at the time of procedure cancellation.   Original Report Authenticated By: Waynard Reeds, M.D.    Dg Chest Portable 1 View  12/20/2011  *RADIOLOGY REPORT*  Clinical Data: Congestive heart failure  PORTABLE CHEST - 1 VIEW  Comparison: 11/30/2011  Findings: Tracheostomy appears well positioned.  Soft feeding tube has its tip in the distal esophagus.  There is cardiomegaly.  There is left lower lobe collapse and there is partial collapse in the right lower lobe.  There is probably pleural fluid on the left.  IMPRESSION: Feeding tube tip only in the distal esophagus.  This finding is being made  a call report.  Worsened basilar volume loss, more extensive on the left than the right.   Original Report Authenticated By: Thomasenia Sales, M.D.    Dg Chest Portable 1 View  11/30/2011  *RADIOLOGY REPORT*  Clinical Data: Respiratory failure.  PORTABLE CHEST - 1 VIEW  Comparison: 11/26/2011  Findings: Tracheostomy stable in position.  Nasogastric tube extends beyond the  inferior aspect of the film.  Support apparatus projects over the right apex.  Mild cardiomegaly.  Possible small left pleural effusion. No pneumothorax.  No congestive failure.  Mild right infrahilar volume loss.  Improved left upper lobe aeration.  Persistent left lower lobe airspace disease.  IMPRESSION:  1.  Partial clearing of left upper lobe airspace disease. 2.  Similar probable small left pleural effusion with adjacent atelectasis or infection. 3. Cardiomegaly without congestive failure.   Original Report Authenticated By: Consuello Bossier, M.D.    Dg Abd Acute W/chest  12/27/2011  *RADIOLOGY REPORT*  Clinical  Data: Abdominal pain.  Discharge from peg tube.  Shortness of breath.   Diabetes.  Hypertension.  ACUTE ABDOMEN SERIES (ABDOMEN 2 VIEW & CHEST 1 VIEW)  Comparison: 12/20/2011  Findings: Tracheostomy tube projects over the tracheal air shadow.  Stable appearance of cardiomegaly with apparent volume loss in the left lung causing leftward deviation of cardiac and mediastinal contours.  A band of opacity is present in the left upper lobe just above the cardiac shadow, and there is complete obscuration of the left hemidiaphragm, presumably from left lower lobe airspace opacity.  Left side down lateral decubitus view of the abdomen demonstrates air fluid levels in the colon and mildly dilated loops of left abdominal small bowel.  There is evidence of chronic bilateral sacroiliitis.  Lower lumbar spondylosis noted.  Vascular calcifications are present in the upper abdomen.  IMPRESSION:  1.  Cardiomegaly. 2.  Suspected atelectasis in the left lower lobe and lingula, causing leftward shift of cardiac and mediastinal structures. Entities such as a central obstructing lesion cannot be excluded, and chest CT should be considered if this atelectasis persists. 3.  Air-fluid levels and loops of large bowel and mildly dilated small bowel, abnormal but technically nonspecific.  The appearance could reflect ileus or low-level distal obstruction. 4.  Chronic bilateral sacroiliitis. 5.  Lower lumbar spondylosis.   Original Report Authenticated By: Gaylyn Rong, M.D.    Dg Abd Portable 1v  12/14/2011  *RADIOLOGY REPORT*  Clinical Data: Feeding tube placement  PORTABLE ABDOMEN - 1 VIEW  Comparison: Abdominal film 12/14/2011  Findings: There is a feeding tube with weighted tip in the gastric body.  Barium within the colon.  IMPRESSION: Feeding tube with tip in the stomach.   Original Report Authenticated By: Genevive Bi, M.D.    Dg Abd Portable 1v  12/14/2011  *RADIOLOGY REPORT*  Clinical Data: Evaluate barium prior to potential percutaneous gastrostomy tube placement  PORTABLE ABDOMEN - 1  VIEW  Comparison: 12/02/2011  Findings:  Administered enteric contrast is seen within the cecum and ascending colon to the level of the hepatic flexure.  Air is seen within the transverse colon to the level of the splenic flexure.  Nonobstructive bowel gas pattern.  Evaluation for pneumoperitoneum is degraded secondary to supine patient positioned exclusion of the lower thorax.  An enteric tube overlies the left upper abdominal quadrant.  Lumbar spine degenerative change.  IMPRESSION: Enteric contrast seen within the cecum and ascending colon to the level of the hepatic flexure.   Original Report Authenticated By: Waynard Reeds, M.D.    Dg Abd Portable 1v  12/02/2011  *RADIOLOGY REPORT*  Clinical Data: Verify enteric tube placement  PORTABLE ABDOMEN - 1 VIEW  Comparison: 11/27/2011  Findings:  A weighted tip enteric tube tip projects over the expected location of the gastric fundus.  Interval removal of nasogastric tube.  Evaluation of the bowel gas is pattern is degraded secondary to positioning and patient motion artifact.  There is apparent mild gaseous distension of multiple loops of bowel.  Evaluation for pneumoperitoneum is limited secondary to patient motion artifact and exclusion of the lower thorax.  Multilevel lumbar spine degenerative change.  IMPRESSION: Weighted enteric tube tip overlies the expected location of the gastric fundus.   Original Report Authenticated By: Waynard Reeds, M.D.          Subjective: Patient has no complains.  Objective: Filed Vitals:   12/28/11 2019 12/29/11 0409 12/29/11 0806 12/29/11 1300  BP:  124/66  153/61  Pulse: 55 51  60  Temp:  98.7 F (37.1 C)  98.4 F (36.9 C)  TempSrc:  Oral  Oral  Resp: 18 18  22   Height:      Weight:  73.4 kg (161 lb 13.1 oz)    SpO2: 100% 100% 99% 100%    Intake/Output Summary (Last 24 hours) at 12/29/11 1429 Last data filed at 12/29/11 1306  Gross per 24 hour  Intake    305 ml  Output    601 ml  Net   -296 ml    Weight change: 0.7 kg (1 lb 8.7 oz) Exam:   General:  Pt is alert, follows commands appropriately, not in acute distress  HEENT: No icterus, No thrush, /AT, tracheostomy tube in place  Cardiovascular: RRR, S1/S2, no rubs, no gallops  Respiratory: CTA bilaterally, no wheezing, no crackles, no rhonchi  Abdomen: mild edema at the TF site   Extremities: 2+ edema, No lymphangitis, No petechiae, No rashes, no synovitis  Data Reviewed: Basic Metabolic Panel:  Lab 12/29/11 4098 12/28/11 0530 12/27/11 1809 12/24/11 0709 12/23/11 0549  NA 138 138 137 138 137  K 4.0 4.0 4.5 4.4 4.4  CL 99 100 100 99 99  CO2 32 32 34* 32 30  GLUCOSE 103* 102* 103* 101* 110*  BUN 57* 60* 59* 73* 71*  CREATININE 2.09* 2.00* 2.02* 2.30* 2.64*  CALCIUM 8.9 9.3 9.3 9.2 9.2  MG -- -- -- -- --  PHOS -- -- -- -- --   Liver Function Tests:  Lab 12/27/11 1809  AST 16  ALT 14  ALKPHOS 68  BILITOT 0.2*  PROT 6.3  ALBUMIN 2.3*   No results found for this basename: LIPASE:5,AMYLASE:5 in the last 168 hours No results found for this basename: AMMONIA:5 in the last 168 hours CBC:  Lab 12/29/11 0720 12/28/11 0530 12/27/11 1809 12/23/11 0549  WBC 9.2 13.3* 12.0* 10.1  NEUTROABS 6.5 -- 9.0* --  HGB 9.1* 8.9* 9.3* 8.9*  HCT 28.1* 28.7* 29.7* 29.0*  MCV 82.9 81.3 82.5 82.4  PLT 282 252 262 284   Cardiac Enzymes: No results found for this basename: CKTOTAL:5,CKMB:5,CKMBINDEX:5,TROPONINI:5 in the last 168 hours BNP: No components found with this basename: POCBNP:5 CBG:  Lab 12/29/11 1114 12/28/11 2135 12/28/11 1550 12/28/11 1151  GLUCAP 92 124* 91 89    Recent Results (from the past 240 hour(s))  CULTURE, BLOOD (ROUTINE X 2)     Status: Normal (Preliminary result)   Collection Time   12/27/11  7:55 PM      Component Value Range Status Comment   Specimen Description BLOOD ARM RIGHT   Final    Special Requests BOTTLES DRAWN AEROBIC AND ANAEROBIC 10CC   Final    Culture  Setup Time 12/28/2011  01:14   Final    Culture     Final    Value:        BLOOD CULTURE RECEIVED NO GROWTH TO DATE CULTURE WILL BE HELD FOR 5 DAYS BEFORE ISSUING A FINAL NEGATIVE REPORT   Report Status PENDING   Incomplete   CULTURE, BLOOD (ROUTINE X 2)     Status: Normal (Preliminary result)   Collection Time   12/27/11  8:00 PM      Component Value Range Status Comment   Specimen Description BLOOD ARM LEFT   Final    Special Requests BOTTLES DRAWN AEROBIC ONLY A [facility]   Final    Culture  Setup Time 12/28/2011 01:14   Final  Culture     Final    Value:        BLOOD CULTURE RECEIVED NO GROWTH TO DATE CULTURE WILL BE HELD FOR 5 DAYS BEFORE ISSUING A FINAL NEGATIVE REPORT   Report Status PENDING   Incomplete   URINE CULTURE     Status: Normal (Preliminary result)   Collection Time   12/28/11 12:53 PM      Component Value Range Status Comment   Specimen Description URINE, CLEAN CATCH   Final    Special Requests NONE   Final    Culture  Setup Time 12/28/2011 14:11   Final    Colony Count 55,000 COLONIES/ML   Final    Culture GRAM NEGATIVE RODS   Final    Report Status PENDING   Incomplete      Scheduled Meds:    . heparin  5,000 Units Subcutaneous Q8H  . influenza  inactive virus vaccine  0.5 mL Intramuscular Tomorrow-1000  . nitroGLYCERIN  0.2 mg Transdermal Daily  . scopolamine  1 patch Transdermal Q72H  . vancomycin  500 mg Intravenous Q24H  . [DISCONTINUED] allopurinol  100 mg Per Tube Daily  . [DISCONTINUED] carvedilol  12.5 mg Per Tube BID WC  . [DISCONTINUED] cholecalciferol  2,000 Units Oral Daily  . [DISCONTINUED] escitalopram  7.5 mg Oral Daily  . [DISCONTINUED] free water  125 mL Per Tube Q4H  . [DISCONTINUED] hydrALAZINE  50 mg Oral Q8H  . [DISCONTINUED] pantoprazole sodium  40 mg Per Tube BID  . [DISCONTINUED] sennosides  10 mL Oral QHS  . [DISCONTINUED] torsemide  20 mg Oral Daily   Continuous Infusions:    . [DISCONTINUED] feeding supplement (OSMOLITE 1.5 CAL) 1,000 mL (12/28/11 1730)      Porche Steinberger, MD  [facility] Pager [phone]  If 7PM-7AM, please contact night-coverage [email or web address] Password [email or web address] 12/29/2011, 2:29 PM   LOS: 2 days

## 2011-12-29 NOTE — Progress Notes (Signed)
Dt Tsuei saw - please see his note.  Mild irritation at the [place] site.  Not severe

## 2011-12-29 NOTE — Progress Notes (Signed)
Subjective:  Asked to see patient for cellulitis around G-tube placed 12/21/11.  Tube seems to be functioning normally.  CT scan showed cellulitis but no abscess or fluid collections.  Patient denies any significant pain in this area.  Objective: Vital signs in last 24 hours: Temp:  [98.3 F (36.8 C)-98.7 F (37.1 C)] 98.7 F (37.1 C) (11/06 0409) Pulse Rate:  [51-60] 51  (11/06 0409) Resp:  [18-19] 18  (11/06 0409) BP: (121-163)/(66-79) 124/66 mmHg (11/06 0409) SpO2:  [99 %-100 %] 99 % (11/06 0806) FiO2 (%):  [28 %] 28 % (11/06 1100) Weight:  [161 lb 13.1 oz (73.4 kg)] 161 lb 13.1 oz (73.4 kg) (11/06 0409) Last BM Date: 12/27/11  Intake/Output from previous day: 11/05 0701 - 11/06 0700 In: 1255 [IV Piggyback:100] Out: 500 [Urine:500] Intake/Output this shift: Total I/O In: 0  Out: 301 [Urine:300; Stool:1]  G-tube site in LUQ - mild erythema; induration around tube 4 cm.  Fibrinous purulent exudate  Lab Results:   Basename 12/29/11 0720 12/28/11 0530  WBC 9.2 13.3*  HGB 9.1* 8.9*  HCT 28.1* 28.7*  PLT 282 252   BMET  Basename 12/29/11 0720 12/28/11 0530  NA 138 138  K 4.0 4.0  CL 99 100  CO2 32 32  GLUCOSE 103* 102*  BUN 57* 60*  CREATININE 2.09* 2.00*  CALCIUM 8.9 9.3   PT/INR No results found for this basename: LABPROT:2,INR:2 in the last 72 hours ABG  Basename 12/27/11 1842  PHART --  HCO3 34.2*    Studies/Results: Ct Abdomen Pelvis Wo Contrast  12/28/2011  *RADIOLOGY REPORT*  Clinical Data: Cellulitis in the abdominal wall surrounding the indwelling gastrostomy tube.  CT ABDOMEN AND PELVIS WITHOUT CONTRAST  Technique:  Multidetector CT imaging of the abdomen and pelvis was performed following the standard protocol without intravenous contrast.  Comparison: CT abdomen and pelvis 08/09/2008.  Findings: Edema/inflammation in the left lateral abdominal wall, including the left lateral abdominal musculature.  No visible fluid collections surrounding  the gastrostomy tube.  Gastrostomy tube appropriately positioned within the proximal body of the stomach.  Mild elevation of the left hemidiaphragm.  Within the limits of the unenhanced technique, no focal parenchymal abnormality involving the liver; enlargement of the left lobe has progressed since the prior examination.  Normal unenhanced appearance of the spleen, pancreas, and gallbladder.  No biliary ductal dilation.  Bilateral low attenuation adrenal masses, measuring approximately 3.3 x 2.0 cm on the right and 2.6 x 3.7 cm on the left, unchanged.  Numerous bilateral renal cysts, the largest arising from the lower pole of the right kidney measuring approximately 8.5 x 6.7 cm, increased in size since the prior examination.  Extensive arterial calcification involving the intrarenal branches bilaterally.  Two adjacent tiny calculi in an upper pole calix of the left kidney without evidence of obstruction.  No right urinary tract calculi.  Extensive aorto- iliofemoral atherosclerosis without aneurysm.  No significant lymphadenopathy.  Small hiatal hernia, unchanged.  Stomach decompressed and otherwise unremarkable.  Normal appearing small bowel.  Liquid stool throughout normal caliber colon.  No ascites.  Uterine fibroids, some which are calcified and degenerated.  No adnexal masses.  No free pelvic fluid.  Bone window images demonstrate osteopenia and degenerative changes throughout the lower thoracic and lumbar spine and both sacroiliac joints. Visualized right lung base clear.  Marked cardiomegaly, with the heart taking of the entire visualized base of the left hemithorax.  IMPRESSION:  1.  Cellulitis involving the left lateral abdominal wall  with myositis involving the lateral abdominal musculature.  No evidence of abscess. 2.  No other acute abnormalities involving the abdomen or pelvis. No abscess surrounding the gastrostomy tube which is appropriately positioned in the proximal body of the stomach. 3.   Hepatic cirrhosis suspected, as there is relative enlargement of the left lobe of the liver which has progressed since 2010.  4.  Stable bilateral adrenal adenomas. 5.  Multiple bilateral renal cysts. 6.  Uterine fibroids, some of which are calcified and degenerated. 7.  Adjacent tiny non-obstructing calculi in an upper pole calix of the left kidney.   Original Report Authenticated By: Hulan Saas, M.D.    Dg Abd Acute W/chest  12/27/2011  *RADIOLOGY REPORT*  Clinical Data: Abdominal pain.  Discharge from peg tube.  Shortness of breath.  Diabetes.  Hypertension.  ACUTE ABDOMEN SERIES (ABDOMEN 2 VIEW & CHEST 1 VIEW)  Comparison: 12/20/2011  Findings: Tracheostomy tube projects over the tracheal air shadow.  Stable appearance of cardiomegaly with apparent volume loss in the left lung causing leftward deviation of cardiac and mediastinal contours.  A band of opacity is present in the left upper lobe just above the cardiac shadow, and there is complete obscuration of the left hemidiaphragm, presumably from left lower lobe airspace opacity.  Left side down lateral decubitus view of the abdomen demonstrates air fluid levels in the colon and mildly dilated loops of left abdominal small bowel.  There is evidence of chronic bilateral sacroiliitis.  Lower lumbar spondylosis noted.  Vascular calcifications are present in the upper abdomen.  IMPRESSION:  1.  Cardiomegaly. 2.  Suspected atelectasis in the left lower lobe and lingula, causing leftward shift of cardiac and mediastinal structures. Entities such as a central obstructing lesion cannot be excluded, and chest CT should be considered if this atelectasis persists. 3.  Air-fluid levels and loops of large bowel and mildly dilated small bowel, abnormal but technically nonspecific.  The appearance could reflect ileus or low-level distal obstruction. 4.  Chronic bilateral sacroiliitis. 5.  Lower lumbar spondylosis.   Original Report Authenticated By: Gaylyn Rong,  M.D.     Anti-infectives: Anti-infectives     Start     Dose/Rate Route Frequency Ordered Stop   12/28/11 0900   vancomycin (VANCOCIN) 500 mg in sodium chloride 0.9 % 100 mL IVPB        500 mg 100 mL/hr over 60 Minutes Intravenous Every 24 hours 12/28/11 0832     12/28/11 0400   clindamycin (CLEOCIN) IVPB 300 mg  Status:  Discontinued        300 mg 100 mL/hr over 30 Minutes Intravenous Every 8 hours 12/27/11 2311 12/28/11 0812   12/27/11 2000   clindamycin (CLEOCIN) IVPB 900 mg        900 mg 100 mL/hr over 30 Minutes Intravenous  Once 12/27/11 1949 12/27/11 2055          Assessment/Plan: s/p gastrostomy tube placement May use feeding tube for nutrition Agree with antibiotics for cellulitis; no sign of abscess Will follow   LOS: 2 days    Lyndie Vanderloop K. 12/29/2011

## 2011-12-29 NOTE — Progress Notes (Signed)
Pharmacist Heart Failure Core Measure Documentation  Assessment: [name] has an EF documented as 25-30% on 09/06/11 by 2-d echocardiogram.  Rationale: Heart failure patients with left ventricular systolic dysfunction (LVSD) and an EF < 40% should be prescribed an angiotensin converting enzyme inhibitor (ACEI) or angiotensin receptor blocker (ARB) at discharge unless a contraindication is documented in the medical record.  This patient is not currently on an ACEI or ARB for HF.  This note is being placed in the record in order to provide documentation that a contraindication to the use of these agents is present for this encounter.  ACE Inhibitor or Angiotensin Receptor Blocker is contraindicated (specify all that apply)  []   ACEI allergy AND ARB allergy []   Angioedema []   Moderate or severe aortic stenosis []   Hyperkalemia []   Hypotension []   Renal artery stenosis [x]   Worsening renal function, preexisting renal disease or dysfunction   [name], RPh  12/29/2011 10:48 AM

## 2011-12-30 DIAGNOSIS — N184 Chronic kidney disease, stage 4 (severe): Secondary | ICD-10-CM

## 2011-12-30 DIAGNOSIS — I279 Pulmonary heart disease, unspecified: Secondary | ICD-10-CM

## 2011-12-30 LAB — URINE CULTURE: Colony Count: 55000

## 2011-12-30 LAB — GLUCOSE, CAPILLARY
Glucose-Capillary: 92 mg/dL (ref 70–99)
Glucose-Capillary: 82 mg/dL (ref 70–99)
Glucose-Capillary: 78 mg/dL (ref 70–99)

## 2011-12-30 MED ORDER — DEXTROSE-NACL 5-0.9 % IV SOLN
INTRAVENOUS | Status: DC
  Administered 2011-12-30: 50 mL via INTRAVENOUS
  Administered 2011-12-31: 1000 mL via INTRAVENOUS

## 2011-12-30 MED ORDER — PRO-STAT SUGAR FREE PO LIQD
30.0000 mL | Freq: Three times a day (TID) | ORAL | Status: DC
  Administered 2011-12-30 – 2011-12-31 (×3): 30 mL
  Filled 2011-12-30 (×6): qty 30

## 2011-12-30 NOTE — Progress Notes (Signed)
Subjective: Patient having a little nausea today  Objective: Vital signs in last 24 hours: Temp:  [97.7 F (36.5 C)-98.4 F (36.9 C)] 98.3 F (36.8 C) (11/07 0506) Pulse Rate:  [60-75] 69  (11/07 0838) Resp:  [18-22] 20  (11/07 0838) BP: (140-158)/(57-68) 158/68 mmHg (11/07 0506) SpO2:  [98 %-100 %] 98 % (11/07 0838) FiO2 (%):  [28 %-30 %] 28 % (11/07 0838) Weight:  [158 lb 15.2 oz (72.1 kg)] 158 lb 15.2 oz (72.1 kg) (11/07 0506) Last BM Date: 12/29/11  Intake/Output from previous day: 11/06 0701 - 11/07 0700 In: 0  Out: 1176 [Urine:1175; Stool:1] Intake/Output this shift: Total I/O In: 0  Out: 200 [Urine:200]  G-tube site clean with minimal drainage, no erythema Abdomen soft, non distended  Lab Results:   Basename 12/29/11 0720 12/28/11 0530  WBC 9.2 13.3*  HGB 9.1* 8.9*  HCT 28.1* 28.7*  PLT 282 252   BMET  Basename 12/29/11 0720 12/28/11 0530  NA 138 138  K 4.0 4.0  CL 99 100  CO2 32 32  GLUCOSE 103* 102*  BUN 57* 60*  CREATININE 2.09* 2.00*  CALCIUM 8.9 9.3   PT/INR No results found for this basename: LABPROT:2,INR:2 in the last 72 hours ABG  Basename 12/27/11 1842  PHART --  HCO3 34.2*    Studies/Results: Ct Abdomen Pelvis Wo Contrast  12/28/2011  *RADIOLOGY REPORT*  Clinical Data: Cellulitis in the abdominal wall surrounding the indwelling gastrostomy tube.  CT ABDOMEN AND PELVIS WITHOUT CONTRAST  Technique:  Multidetector CT imaging of the abdomen and pelvis was performed following the standard protocol without intravenous contrast.  Comparison: CT abdomen and pelvis 08/09/2008.  Findings: Edema/inflammation in the left lateral abdominal wall, including the left lateral abdominal musculature.  No visible fluid collections surrounding the gastrostomy tube.  Gastrostomy tube appropriately positioned within the proximal body of the stomach.  Mild elevation of the left hemidiaphragm.  Within the limits of the unenhanced technique, no focal  parenchymal abnormality involving the liver; enlargement of the left lobe has progressed since the prior examination.  Normal unenhanced appearance of the spleen, pancreas, and gallbladder.  No biliary ductal dilation.  Bilateral low attenuation adrenal masses, measuring approximately 3.3 x 2.0 cm on the right and 2.6 x 3.7 cm on the left, unchanged.  Numerous bilateral renal cysts, the largest arising from the lower pole of the right kidney measuring approximately 8.5 x 6.7 cm, increased in size since the prior examination.  Extensive arterial calcification involving the intrarenal branches bilaterally.  Two adjacent tiny calculi in an upper pole calix of the left kidney without evidence of obstruction.  No right urinary tract calculi.  Extensive aorto- iliofemoral atherosclerosis without aneurysm.  No significant lymphadenopathy.  Small hiatal hernia, unchanged.  Stomach decompressed and otherwise unremarkable.  Normal appearing small bowel.  Liquid stool throughout normal caliber colon.  No ascites.  Uterine fibroids, some which are calcified and degenerated.  No adnexal masses.  No free pelvic fluid.  Bone window images demonstrate osteopenia and degenerative changes throughout the lower thoracic and lumbar spine and both sacroiliac joints. Visualized right lung base clear.  Marked cardiomegaly, with the heart taking of the entire visualized base of the left hemithorax.  IMPRESSION:  1.  Cellulitis involving the left lateral abdominal wall with myositis involving the lateral abdominal musculature.  No evidence of abscess. 2.  No other acute abnormalities involving the abdomen or pelvis. No abscess surrounding the gastrostomy tube which is appropriately positioned in the proximal  body of the stomach. 3.  Hepatic cirrhosis suspected, as there is relative enlargement of the left lobe of the liver which has progressed since 2010.  4.  Stable bilateral adrenal adenomas. 5.  Multiple bilateral renal cysts. 6.  Uterine  fibroids, some of which are calcified and degenerated. 7.  Adjacent tiny non-obstructing calculi in an upper pole calix of the left kidney.   Original Report Authenticated By: Hulan Saas, M.D.     Anti-infectives: Anti-infectives     Start     Dose/Rate Route Frequency Ordered Stop   12/28/11 0900   vancomycin (VANCOCIN) 500 mg in sodium chloride 0.9 % 100 mL IVPB        500 mg 100 mL/hr over 60 Minutes Intravenous Every 24 hours 12/28/11 0832     12/28/11 0400   clindamycin (CLEOCIN) IVPB 300 mg  Status:  Discontinued        300 mg 100 mL/hr over 30 Minutes Intravenous Every 8 hours 12/27/11 2311 12/28/11 0812   12/27/11 2000   clindamycin (CLEOCIN) IVPB 900 mg        900 mg 100 mL/hr over 30 Minutes Intravenous  Once 12/27/11 1949 12/27/11 2055          Assessment/Plan: s/p * No surgery found *  Hold tube feeds for 24 more hours  LOS: 3 days    Juwann Sherk A 12/30/2011

## 2011-12-30 NOTE — Progress Notes (Signed)
[facility] PROGRESS NOTE  [name] WUJ:[id number] DOB: [birth year] DOA: 12/27/2011 PCP: Gwynneth Aliment, MD  Assessment/Plan: Abdominal wall cellulitis -Approximately 3-4 cm induration around gastrostomy tube site with mild erythema -CT abdomen rule out abdominal wall fluid collection - D/w CCS on 11/6 and plan is to place TF on hold and then resume them in 24 hrs.  -Started vancomycin for staphylococcal infection more common with recent laparoscopic G-tube placement on December 21, 2011 Leukocytosis -Likely due to cellulitis - as it resolved without any other abx but vancomycin  Chronic respiratory failure due to OSA/OHS/ -Status post tracheostomy (October 1) with supplemental oxygen -Start patient back on scopolamine patch do to secretions - passy muir valve trials OK - patient to go to trach clinic at [facility] on discharge  Chronic systolic and diastolic heart failure -Compensated at this time -Ejection fraction 25-30% -Dysphagia due to being on the vent and having trach -Status post laparoscopic gastrostomy tube placement 12/21/2011 -Patient leaking all TF around PEG site - CCS following along  - MBS 12/31/11 - maybe she can swallow   Chronic kidney disease stage IV -Baseline creatinine between 1.6-1.9 Questionable diabetes mellitus type 2 -Hemoglobin A1c 4.2 on 11/26/2011  Hypertension   Bacteriuria - without symptoms - no need for abx    Antibiotics:  Vancomycin November 4>>>    Procedures/Studies: Ir Fluoro Rm 30-60 Min  12-17-11  *RADIOLOGY REPORT*  Clinical Data: Dysphasia, in need of gastrostomy tube placement for supplementation of nutrition.  IR FLOURO RM 0-60 MIN  Comparison: Abdominal radiograph - earlier same day; abdominal CT - 08/07/2008  Findings:  Preprocedural spot radiograph demonstrates redundant transverse colon overlying the expected location of the gastric fundus.  Ultrasound scanning was performed to demarcate the lateral and of the  left lobe of the liver.  Scissors were placed to demarcate the desired location for gastrostomy tube placement.  Despite the insufflation of the stomach with air, the redundant transverse colon remained overlying the percutaneous access site. As such, the procedure was cancelled.  IMPRESSION: Cancelled percutaneous gastrostomy tube placement secondary to interposed transverse colon.  Would recommend surgical consultation for surgically placed gastrostomy tube.  Above findings discussed with Dr. Elias Else at the time of procedure cancellation.   Original Report Authenticated By: Waynard Reeds, M.D.    Dg Chest Portable 1 View  12/20/2011  *RADIOLOGY REPORT*  Clinical Data: Congestive heart failure  PORTABLE CHEST - 1 VIEW  Comparison: 11/30/2011  Findings: Tracheostomy appears well positioned.  Soft feeding tube has its tip in the distal esophagus.  There is cardiomegaly.  There is left lower lobe collapse and there is partial collapse in the right lower lobe.  There is probably pleural fluid on the left.  IMPRESSION: Feeding tube tip only in the distal esophagus.  This finding is being made  a call report.  Worsened basilar volume loss, more extensive on the left than the right.   Original Report Authenticated By: Thomasenia Sales, M.D.    Dg Chest Portable 1 View  11/30/2011  *RADIOLOGY REPORT*  Clinical Data: Respiratory failure.  PORTABLE CHEST - 1 VIEW  Comparison: 11/26/2011  Findings: Tracheostomy stable in position.  Nasogastric tube extends beyond the  inferior aspect of the film.  Support apparatus projects over the right apex.  Mild cardiomegaly.  Possible small left pleural effusion. No pneumothorax.  No congestive failure.  Mild right infrahilar volume loss.  Improved left upper lobe aeration.  Persistent left lower lobe airspace disease.  IMPRESSION:  1.  Partial clearing of left upper lobe airspace disease. 2.  Similar probable small left pleural effusion with adjacent atelectasis or infection.  3. Cardiomegaly without congestive failure.   Original Report Authenticated By: Consuello Bossier, M.D.    Dg Abd Acute W/chest  12/27/2011  *RADIOLOGY REPORT*  Clinical Data: Abdominal pain.  Discharge from peg tube.  Shortness of breath.  Diabetes.  Hypertension.  ACUTE ABDOMEN SERIES (ABDOMEN 2 VIEW & CHEST 1 VIEW)  Comparison: 12/20/2011  Findings: Tracheostomy tube projects over the tracheal air shadow.  Stable appearance of cardiomegaly with apparent volume loss in the left lung causing leftward deviation of cardiac and mediastinal contours.  A band of opacity is present in the left upper lobe just above the cardiac shadow, and there is complete obscuration of the left hemidiaphragm, presumably from left lower lobe airspace opacity.  Left side down lateral decubitus view of the abdomen demonstrates air fluid levels in the colon and mildly dilated loops of left abdominal small bowel.  There is evidence of chronic bilateral sacroiliitis.  Lower lumbar spondylosis noted.  Vascular calcifications are present in the upper abdomen.  IMPRESSION:  1.  Cardiomegaly. 2.  Suspected atelectasis in the left lower lobe and lingula, causing leftward shift of cardiac and mediastinal structures. Entities such as a central obstructing lesion cannot be excluded, and chest CT should be considered if this atelectasis persists. 3.  Air-fluid levels and loops of large bowel and mildly dilated small bowel, abnormal but technically nonspecific.  The appearance could reflect ileus or low-level distal obstruction. 4.  Chronic bilateral sacroiliitis. 5.  Lower lumbar spondylosis.   Original Report Authenticated By: Gaylyn Rong, M.D.    Dg Abd Portable 1v  12/14/2011  *RADIOLOGY REPORT*  Clinical Data: Feeding tube placement  PORTABLE ABDOMEN - 1 VIEW  Comparison: Abdominal film 12/14/2011  Findings: There is a feeding tube with weighted tip in the gastric body.  Barium within the colon.  IMPRESSION: Feeding tube with tip in the  stomach.   Original Report Authenticated By: Genevive Bi, M.D.    Dg Abd Portable 1v  12/14/2011  *RADIOLOGY REPORT*  Clinical Data: Evaluate barium prior to potential percutaneous gastrostomy tube placement  PORTABLE ABDOMEN - 1 VIEW  Comparison: 12/02/2011  Findings:  Administered enteric contrast is seen within the cecum and ascending colon to the level of the hepatic flexure.  Air is seen within the transverse colon to the level of the splenic flexure.  Nonobstructive bowel gas pattern.  Evaluation for pneumoperitoneum is degraded secondary to supine patient positioned exclusion of the lower thorax.  An enteric tube overlies the left upper abdominal quadrant.  Lumbar spine degenerative change.  IMPRESSION: Enteric contrast seen within the cecum and ascending colon to the level of the hepatic flexure.   Original Report Authenticated By: Waynard Reeds, M.D.    Dg Abd Portable 1v  12/02/2011  *RADIOLOGY REPORT*  Clinical Data: Verify enteric tube placement  PORTABLE ABDOMEN - 1 VIEW  Comparison: 11/27/2011  Findings:  A weighted tip enteric tube tip projects over the expected location of the gastric fundus.  Interval removal of nasogastric tube.  Evaluation of the bowel gas is pattern is degraded secondary to positioning and patient motion artifact.  There is apparent mild gaseous distension of multiple loops of bowel.  Evaluation for pneumoperitoneum is limited secondary to patient motion artifact and exclusion of the lower thorax.  Multilevel lumbar spine degenerative change.  IMPRESSION: Weighted enteric tube tip overlies the  expected location of the gastric fundus.   Original Report Authenticated By: Waynard Reeds, M.D.          Subjective: Patient has no complains.   Objective: Filed Vitals:   12/30/11 0838 12/30/11 1122 12/30/11 1300 12/30/11 1500  BP:   170/72   Pulse: 69 63 75 64  Temp:   98 F (36.7 C)   TempSrc:   Oral   Resp: 20 18 20 16   Height:      Weight:        SpO2: 98% 100% 95% 100%   Patient Vitals for the past 24 hrs:  BP Temp Temp src Pulse Resp SpO2 Weight  12/30/11 1500 - - - 64  16  100 % -  12/30/11 1300 170/72 mmHg 98 F (36.7 C) Oral 75  20  95 % -  12/30/11 1122 - - - 63  18  100 % -  12/30/11 0838 - - - 69  20  98 % -  12/30/11 0506 158/68 mmHg 98.3 F (36.8 C) Oral 63  20  98 % 72.1 kg (158 lb 15.2 oz)  12/30/11 0413 - - - 65  18  98 % -  12/30/11 0047 - - - 75  18  99 % -  12/29/11 2131 - - - 75  18  99 % -  12/29/11 2013 140/57 mmHg 97.7 F (36.5 C) Oral 60  20  100 % -      Intake/Output Summary (Last 24 hours) at 12/30/11 1752 Last data filed at 12/30/11 1300  Gross per 24 hour  Intake      0 ml  Output   1025 ml  Net  -1025 ml   Weight change: -1.3 kg (-2 lb 13.9 oz) Exam:   General:  Pt is alert, follows commands appropriately, not in acute distress  HEENT: No icterus, No thrush, [place]/AT, tracheostomy tube in place  Cardiovascular: RRR, S1/S2, no rubs, no gallops  Respiratory: CTA bilaterally, no wheezing, no crackles, no rhonchi  Abdomen: mild edema at the TF site   Extremities: 2+ edema, No lymphangitis, No petechiae, No rashes, no synovitis  Data Reviewed: Basic Metabolic Panel:  Lab 12/29/11 4098 12/28/11 0530 12/27/11 1809 12/24/11 0709  NA 138 138 137 138  K 4.0 4.0 4.5 4.4  CL 99 100 100 99  CO2 32 32 34* 32  GLUCOSE 103* 102* 103* 101*  BUN 57* 60* 59* 73*  CREATININE 2.09* 2.00* 2.02* 2.30*  CALCIUM 8.9 9.3 9.3 9.2  MG -- -- -- --  PHOS -- -- -- --   Liver Function Tests:  Lab 12/27/11 1809  AST 16  ALT 14  ALKPHOS 68  BILITOT 0.2*  PROT 6.3  ALBUMIN 2.3*   No results found for this basename: LIPASE:5,AMYLASE:5 in the last 168 hours No results found for this basename: AMMONIA:5 in the last 168 hours CBC:  Lab 12/29/11 0720 12/28/11 0530 12/27/11 1809  WBC 9.2 13.3* 12.0*  NEUTROABS 6.5 -- 9.0*  HGB 9.1* 8.9* 9.3*  HCT 28.1* 28.7* 29.7*  MCV 82.9 81.3 82.5  PLT 282 252  262   Cardiac Enzymes: No results found for this basename: CKTOTAL:5,CKMB:5,CKMBINDEX:5,TROPONINI:5 in the last 168 hours BNP: No components found with this basename: POCBNP:5 CBG:  Lab 12/30/11 1559 12/30/11 1116 12/30/11 0610 12/29/11 2049 12/29/11 1646  GLUCAP 78 79 82 92 90    Recent Results (from the past 240 hour(s))  CULTURE, BLOOD (ROUTINE X 2)  Status: Normal (Preliminary result)   Collection Time   12/27/11  7:55 PM      Component Value Range Status Comment   Specimen Description BLOOD ARM RIGHT   Final    Special Requests BOTTLES DRAWN AEROBIC AND ANAEROBIC 10CC   Final    Culture  Setup Time 12/28/2011 01:14   Final    Culture     Final    Value:        BLOOD CULTURE RECEIVED NO GROWTH TO DATE CULTURE WILL BE HELD FOR 5 DAYS BEFORE ISSUING A FINAL NEGATIVE REPORT   Report Status PENDING   Incomplete   CULTURE, BLOOD (ROUTINE X 2)     Status: Normal (Preliminary result)   Collection Time   12/27/11  8:00 PM      Component Value Range Status Comment   Specimen Description BLOOD ARM LEFT   Final    Special Requests BOTTLES DRAWN AEROBIC ONLY [facility]   Final    Culture  Setup Time 12/28/2011 01:14   Final    Culture     Final    Value:        BLOOD CULTURE RECEIVED NO GROWTH TO DATE CULTURE WILL BE HELD FOR 5 DAYS BEFORE ISSUING A FINAL NEGATIVE REPORT   Report Status PENDING   Incomplete   URINE CULTURE     Status: Normal   Collection Time   12/28/11 12:53 PM      Component Value Range Status Comment   Specimen Description URINE, CLEAN CATCH   Final    Special Requests NONE   Final    Culture  Setup Time 12/28/2011 14:11   Final    Colony Count 55,000 COLONIES/ML   Final    Culture PROTEUS MIRABILIS   Final    Report Status 12/30/2011 FINAL   Final    Organism ID, Bacteria PROTEUS MIRABILIS   Final      Scheduled Meds:    . feeding supplement  30 mL Per Tube TID  . heparin  5,000 Units Subcutaneous Q8H  . nitroGLYCERIN  0.2 mg Transdermal Daily  . scopolamine   1 patch Transdermal Q72H  . vancomycin  500 mg Intravenous Q24H   Continuous Infusions:    . dextrose 5 % and 0.9% NaCl 50 mL (12/30/11 1603)     Jos Cygan, MD  [facility] Pager [phone]  If 7PM-7AM, please contact night-coverage [email or web address] Password [email or web address] 12/30/2011, 5:52 PM   LOS: 3 days

## 2011-12-30 NOTE — Evaluation (Signed)
Clinical/Bedside Swallow Evaluation Patient Details  Name: [name] MRN: [id number] Date of Birth: [birth year]  Today's Date: 12/30/2011 Time: 1355-1415 SLP Time Calculation (min): 20 min  Past Medical History:  Past Medical History  Diagnosis Date  . COPD (chronic obstructive pulmonary disease)     on home o2  . Chronic renal insufficiency   . CVA (cerebral infarction)     hx  . Hypertensive heart disease with congestive heart failure   . Chronic cor pulmonale   . Cardiomyopathy of undetermined type 09/07/2011  . Chronic kidney disease (CKD), stage IV (severe)   . Hyperlipdemia   . Obesity (BMI 30-39.9)   . Sleep apnea   . History of gout 09/07/2011  . Heart failure 7/13  . Shortness of breath   . Anemia   . Vascular disease   . Asthma   . Anginal pain   . Hypertension   . Type II or unspecified type diabetes mellitus without mention of complication, not stated as uncontrolled   . Stroke   . GERD (gastroesophageal reflux disease)   . Arthritis    Past Surgical History:  Past Surgical History  Procedure Date  . Bilateral oophorectomy   . Cardiac catheterization     right heart cath  . Laparoscopic gastrostomy 12/21/2011    Procedure: LAPAROSCOPIC GASTROSTOMY;  Surgeon: Axel Filler, MD;  Location: [facility] OR;  Service: General;  Laterality: N/A;  laparoscopic gastrostomy possible open feeding tube  . Abdominal hysterectomy   . Tracheostomy 11/2011  . Cataracts    HPI:  71 y.o. female with multiple medical problems admitted 12/27/11 with cellulitis around G-tube site.  Pt admitted to [facility] last month with OHS/OSA, diastolic and systolic CHF, COPD, DM, HTN, hypercarbic respiratory failure with intubation 9/22-9/26, re-intubation 9/30, and trach 10/1.  G-tube placed 10/29, Passy-Muir valve eval 10/2; D/C'd to [facility] 10/3 with subsequent D/C to [facility].  Pt has been NPO for this duration.  No record of FEES or MBS per records.       Assessment / Plan /  Recommendation Clinical Impression  Pt with hx of trach and G-tube during last hospitalization; wearing Passy-Muir valve (#6 cuffless Shiley trach) upon entering room.  Pt requesting POs.  Reviewed hx; no record of MBS or FEES in this facility, but had a clinical swallow eval on 9/29 prior to intubation that revealed a relatively functional swallow.    Presents today with clear, low-intensity phonation pre- and post-swallow; adequate sensorimotor function of cranial nerves; active mastication of ice chips and palpable laryngeal mobility during swallow trigger.  Recommend proceeding with MBS given history (orders received.)  Will schedule for a.m.  Pt in agreement.        Diet Recommendation NPO        Other  Recommendations Recommended Consults: MBS Oral Care Recommendations: Oral care QID   Follow Up Recommendations          Pertinent Vitals/Pain No pain    SLP Swallow Goals   Goals to be determined pending results MBS   Naheim Burgen L. Samson Frederic, [place] CCC/SLP Pager [phone]   Blenda Mounts Laurice 12/30/2011,2:56 PM

## 2011-12-30 NOTE — Progress Notes (Signed)
Noted some leakage around gt.upon adminitating med.  Will continue to monitor.  Amanda Pea, RN>

## 2011-12-31 ENCOUNTER — Observation Stay (HOSPITAL_COMMUNITY): Payer: Medicare Other

## 2011-12-31 DIAGNOSIS — K7460 Unspecified cirrhosis of liver: Secondary | ICD-10-CM | POA: Diagnosis present

## 2011-12-31 DIAGNOSIS — L03319 Cellulitis of trunk, unspecified: Secondary | ICD-10-CM

## 2011-12-31 LAB — GLUCOSE, CAPILLARY
Glucose-Capillary: 89 mg/dL (ref 70–99)
Glucose-Capillary: 113 mg/dL — ABNORMAL HIGH (ref 70–99)
Glucose-Capillary: 148 mg/dL — ABNORMAL HIGH (ref 70–99)

## 2011-12-31 MED ORDER — BISACODYL 10 MG RE SUPP: 10.0000 mg | Freq: Two times a day (BID) | RECTAL | Status: DC | PRN

## 2011-12-31 MED ORDER — METOCLOPRAMIDE HCL 5 MG/5ML PO SOLN
10.0000 mg | Freq: Four times a day (QID) | ORAL | Status: DC
  Filled 2011-12-31 (×6): qty 10

## 2011-12-31 MED ORDER — OSMOLITE 1.2 CAL PO LIQD
1000.0000 mL | ORAL | Status: DC
  Filled 2011-12-31 (×2): qty 1000

## 2011-12-31 MED ORDER — METOCLOPRAMIDE HCL 5 MG/5ML PO SOLN
10.0000 mg | Freq: Four times a day (QID) | ORAL | Status: DC
  Filled 2011-12-31 (×4): qty 10

## 2011-12-31 MED ORDER — CEFAZOLIN SODIUM 1-5 GM-% IV SOLN
1.0000 g | Freq: Three times a day (TID) | INTRAVENOUS | Status: DC
  Administered 2011-12-31 – 2012-01-01 (×3): 1 g via INTRAVENOUS
  Filled 2011-12-31 (×5): qty 50

## 2011-12-31 MED ORDER — ENSURE COMPLETE PO LIQD
237.0000 mL | ORAL | Status: DC
  Administered 2011-12-31: 237 mL via ORAL

## 2011-12-31 MED ORDER — MAGNESIUM HYDROXIDE 400 MG/5ML PO SUSP: 30.0000 mL | Freq: Two times a day (BID) | ORAL | Status: DC | PRN

## 2011-12-31 MED ORDER — POLYETHYLENE GLYCOL 3350 17 G PO PACK
17.0000 g | PACK | Freq: Two times a day (BID) | ORAL | Status: DC
  Filled 2011-12-31 (×4): qty 1

## 2011-12-31 NOTE — Procedures (Signed)
Objective Swallowing Evaluation: Modified Barium Swallowing Study  Patient Details  Name: [name] MRN: [id number] Date of Birth: 1940-10-29  Today's Date: 12/31/2011 Time: 1015-1045 SLP Time Calculation (min): 30 min  Past Medical History:  Past Medical History  Diagnosis Date  . COPD (chronic obstructive pulmonary disease)     on home o2  . Chronic renal insufficiency   . CVA (cerebral infarction)     hx  . Hypertensive heart disease with congestive heart failure   . Chronic cor pulmonale   . Cardiomyopathy of undetermined type 09/07/2011  . Chronic kidney disease (CKD), stage IV (severe)   . Hyperlipdemia   . Obesity (BMI 30-39.9)   . Sleep apnea   . History of gout 09/07/2011  . Heart failure 7/13  . Shortness of breath   . Anemia   . Vascular disease   . Asthma   . Anginal pain   . Hypertension   . Type II or unspecified type diabetes mellitus without mention of complication, not stated as uncontrolled   . Stroke   . GERD (gastroesophageal reflux disease)   . Arthritis    Past Surgical History:  Past Surgical History  Procedure Date  . Bilateral oophorectomy   . Cardiac catheterization     right heart cath  . Laparoscopic gastrostomy 12/21/2011    Procedure: LAPAROSCOPIC GASTROSTOMY;  Surgeon: Axel Filler, MD;  Location: [facility] OR;  Service: General;  Laterality: N/A;  laparoscopic gastrostomy possible open feeding tube  . Abdominal hysterectomy   . Tracheostomy 11/2011  . Cataracts    HPI:  71 y.o. female with multiple medical problems admitted 12/27/11 with cellulitis around G-tube site.  Pt admitted to [facility] last month with OHS/OSA, diastolic and systolic CHF, COPD, DM, HTN, hypercarbic respiratory failure with intubation 9/22-9/26, re-intubation 9/30, and trach 10/1.  G-tube placed 10/29, Passy-Muir valve eval 10/2; D/C'd to [facility] 10/3 with subsequent D/C to [facility].  Pt has been NPO for this duration.  No record of FEES or MBS.     Pt  underwent clinical swallow eval yesterday with recs to proceed with MBS.  Trach downsized to cuffless #4 Shiley today.  Pt wearing PMSV during all waking hours.      Assessment / Plan / Recommendation Clinical Impression  Dysphagia Diagnosis: Mild pharyngeal phase dysphagia  Clinical impression: Pt presents with a mild pharyngeal dysphagia marked by consistent, trace penetration of thin and nectar-thick liquids into the laryngeal vestibule (secondary to delayed swallow initiation.)  Even with large, consecutive liquid boluses, no aspiration was observed.  Swallow function remained consistent with and without use of PMSV.  There was adequate oral propulsion and bolus transfer through pharynx, with no bolus residue post-swallow.    Pt is safe to begin POs - recommend mechanical soft (due to absence of teeth/pt's preference) and thin liquids.  Defer to MD re: necessity of G-tube at this point - pt verbalizing motivation to eat.           Diet Recommendation Dysphagia 3 (Mechanical Soft);Thin liquid   Liquid Administration via: Cup;Straw Medication Administration: Whole meds with puree Supervision: Patient able to self feed Compensations: Small sips/bites: small sips of thin liquid decrease penetration, Postural Changes and/or Swallow Maneuvers: Seated upright 90 degrees    Other  Recommendations Oral Care Recommendations: Oral care QID   Follow Up Recommendations       Frequency and Duration min 1 x/week  1 week   Pertinent Vitals/Pain pain    SLP  Swallow Goals Patient will utilize recommended strategies during swallow to increase swallowing safety with: Independent assistance  Alliyah Roesler L. Samson Frederic, [place] CCC/SLP Pager [phone]                  Blenda Mounts Laurice 12/31/2011, 11:11 AM

## 2011-12-31 NOTE — Progress Notes (Signed)
[name] [id number] 1940-09-30   Subjective:  Trach dislodged - smaller one placed Having BMs  Objective:  Vital signs:  Filed Vitals:   12/31/11 0445 12/31/11 0506 12/31/11 0730 12/31/11 0816  BP:  169/84    Pulse: 58 63  71  Temp:  97.9 F (36.6 C)    TempSrc:  Oral    Resp: 18 18  20   Height:      Weight:  162 lb 4.1 oz (73.6 kg)    SpO2: 100% 100% 100% 98%    Last BM Date: 12/30/11  Intake/Output   Yesterday:  11/07 0701 - 11/08 0700 In: 935 [I.V.:735; IV Piggyback:200] Out: 927 [Urine:925; Stool:2] This shift:     Bowel function:  Flatus: y  BM: x2  Physical Exam:  General: Pt awake/alert/oriented x4 in no acute distress Eyes: PERRL, normal EOM.  Sclera clear.  No icterus Neuro: CN II-XII intact w/o focal sensory/motor deficits. Lymph: No head/neck/groin lymphadenopathy Psych:  No delerium/psychosis/paranoia HENT: Normocephalic, Mucus membranes moist.  No thrush.  Tach in place Neck: Supple, No tracheal deviation Chest: *No chest wall pain w good excursion CV:  Pulses intact.  Regular rhythm MS: Normal AROM mjr joints.  No obvious deformity Abdomen: Soft.  Nondistended.  Mildly tender at Gtube only.  No mjr cellulitis.  No incarcerated hernias. Ext:  SCDs BLE.  No mjr edema.  No cyanosis Skin: No petechiae / purpurae  Problem List:  Active Problems:  Type 2 diabetes mellitus with vascular disease  Hyperlipdemia  Obesity (BMI 30-39.9)  Sleep apnea  C O P D  Chronic kidney disease (CKD), stage IV (severe)  Cellulitis  Chronic respiratory failure  Abdominal wall cellulitis  Cirrhosis of liver   Assessment  [name]  71 y.o. female       Ileus resolving  Cellulitis resolving  Plan:  -restart TFs -switch to cefazolin since no MRSA x 3 more days -bowel regimen  -will f/u on Monday - call w questions over weekend  -VTE prophylaxis- SCDs, etc -mobilize as tolerated to help recovery  Ardeth Sportsman, M.D.,  F.A.C.S. [facility] [address], [address] [place], [place] [address] [phone] Main / Paging [phone] Voice Mail   12/31/2011  CARE TEAM:  PCP: Gwynneth Aliment, MD  Outpatient Care Team: Patient Care Team: Dorothyann Peng, MD as PCP - General (Internal Medicine)  Inpatient Treatment Team: Treatment Team: Attending Provider: Lorane Gell, MD; Rounding Team: Doran Clay, MD; Technician: Lolita Patella Poindexter, NT; Registered Nurse: Hulan Fess, RN; Consulting Physician: Bishop Limbo, MD; Registered Nurse: Kern Alberta, RN; Registered Nurse: Lizbeth Bark, RN; Respiratory Therapist: Audelia Hives, RRT; Speech Language Pathologist: Carolan Shiver, CCC-SLP   Results:   Labs: Results for orders placed during the hospital encounter of 12/27/11 (from the past 48 hour(s))  GLUCOSE, CAPILLARY     Status: Normal   Collection Time   12/29/11 11:14 AM      Component Value Range Comment   Glucose-Capillary 92  70 - 99 mg/dL   GLUCOSE, CAPILLARY     Status: Normal   Collection Time   12/29/11  4:46 PM      Component Value Range Comment   Glucose-Capillary 90  70 - 99 mg/dL    Comment 1 Notify RN     GLUCOSE, CAPILLARY     Status: Normal   Collection Time   12/29/11  8:49 PM  Component Value Range Comment   Glucose-Capillary 92  70 - 99 mg/dL    Comment 1 Notify RN     GLUCOSE, CAPILLARY     Status: Normal   Collection Time   12/30/11  6:10 AM      Component Value Range Comment   Glucose-Capillary 82  70 - 99 mg/dL    Comment 1 Notify RN     GLUCOSE, CAPILLARY     Status: Normal   Collection Time   12/30/11 11:16 AM      Component Value Range Comment   Glucose-Capillary 79  70 - 99 mg/dL    Comment 1 Notify RN     GLUCOSE, CAPILLARY     Status: Normal   Collection Time   12/30/11  3:59 PM      Component Value Range Comment   Glucose-Capillary 78  70 - 99 mg/dL    Comment  1 Notify RN     GLUCOSE, CAPILLARY     Status: Normal   Collection Time   12/30/11  9:17 PM      Component Value Range Comment   Glucose-Capillary 89  70 - 99 mg/dL     Imaging / Studies: No results found.  Medications / Allergies: per chart  Antibiotics: Anti-infectives     Start     Dose/Rate Route Frequency Ordered Stop   12/31/11 1400   ceFAZolin (ANCEF) IVPB 1 g/50 mL premix        1 g 100 mL/hr over 30 Minutes Intravenous 3 times per day 12/31/11 1026 01/03/12 1359   12/28/11 0900   vancomycin (VANCOCIN) 500 mg in sodium chloride 0.9 % 100 mL IVPB  Status:  Discontinued        500 mg 100 mL/hr over 60 Minutes Intravenous Every 24 hours 12/28/11 0832 12/31/11 1026   12/28/11 0400   clindamycin (CLEOCIN) IVPB 300 mg  Status:  Discontinued        300 mg 100 mL/hr over 30 Minutes Intravenous Every 8 hours 12/27/11 2311 12/28/11 0812   12/27/11 2000   clindamycin (CLEOCIN) IVPB 900 mg        900 mg 100 mL/hr over 30 Minutes Intravenous  Once 12/27/11 1949 12/27/11 2055

## 2011-12-31 NOTE — Progress Notes (Signed)
Noted this am at 0730 that pt's trach had come out of it's stoma and notified assigned RT who inserted it back in .  Pt in no acute distress  Noted.  Dr. Verta Ellen made aware.   Instructed she will come see pt.  Bp 156/66. P 69.  Will continue to monitor.  Amanda Pea, Charity fundraiser.

## 2011-12-31 NOTE — Progress Notes (Signed)
RT Note: Trach dislodged, RT unable to re-insert #6 cuffless shiley, replaced with #4 cuffless shiley. Positive Co2 detected, pt able to vocalize with PMV, tolerating well, VS stable, RT will continue to monitor.

## 2011-12-31 NOTE — Progress Notes (Addendum)
Patient ID: [name], female   DOB: Nov 02, 1940, 71 y.o.   MRN: [id number]  [facility] PROGRESS NOTE  [name] MWN:[id number] DOB: 1940/05/22 DOA: 12/27/2011 PCP: Gwynneth Aliment, MD  Brief HPI: Pt is 71 year old female who presented to [facility] 11/04 for evaluation of persistent generalized abdominal pain that initially started several days after she has had tube placed about 1 month prior to admission. She has noted more purulent drainage from the tube and also endorsees fever as high as 101 F. Pt denied other abdominal or urinary concerns. She was admitted by [facility] for further management. Swallow evaluation done 11/08 and recommendation was to start with soft solid diet.  Abdominal wall cellulitis  -Approximately 3-4 cm induration around gastrostomy tube site with no erythema this AM -CT abdomen rule out abdominal wall fluid collection  -D/w [facility] on 11/6 and plan is to place TF on hold and then resume them in 24 hrs.  -Started vancomycin for staphylococcal infection more common with recent laparoscopic G-tube placement on December 21, 2011  Leukocytosis  -Likely due to cellulitis  -now with normal limits -CBC in AM Chronic respiratory failure due to OSA/OHS/  -Status post tracheostomy (October 1) with supplemental oxygen  -Start patient back on scopolamine patch do to secretions  - passy muir valve trials OK  - patient to go to trach clinic at [facility] on discharge  Chronic systolic and diastolic heart failure  -Compensated at this time  -Ejection fraction 25-30%  -monitor daily weights and oxygen saturations -will discontinue IVF Dysphagia due to being on the vent and having trach  -Status post laparoscopic gastrostomy tube placement 12/21/2011  -swallow evaluation done and recommendation is to proceed with advancing diet as pt able to tolerate, soft solid for now Anemia of chronic disease -Hg and Hct stable Chronic kidney disease stage IV  -Baseline creatinine  between 1.6-1.9  -creatinine is up from slightly from yesterday -BMP in AM Questionable diabetes mellitus type 2  -Hemoglobin A1c 4.2 on 11/26/2011  Hypertension  -stable, monitor vitals per floor protocol Bacteriuria  -without symptoms  Antibiotics:  Vancomycin 11/04 --> 11/08 Cefazolin 11/08 -->  Procedures/Studies:  Ir Fluoro Rm 30-60 Min Jan 05, 2012  IMPRESSION:  Cancelled percutaneous gastrostomy tube placement secondary to interposed transverse colon. Would recommend surgical consultation for surgically placed gastrostomy tube.   12/20/2011 IMPRESSION:  Feeding tube tip only in the distal esophagus. This finding is being made a call report.  Worsened basilar volume loss, more extensive on the left than the right.  Dg Abd Acute W/chest  12/27/2011 :  1. Cardiomegaly.  2. Suspected atelectasis in the left lower lobe and lingula, causing leftward shift of cardiac and mediastinal structures. Entities such as a central obstructing lesion cannot be excluded, and chest CT should be considered if this atelectasis persists.  3. Air-fluid levels and loops of large bowel and mildly dilated small bowel, abnormal but technically nonspecific. The appearance could reflect ileus or low-level distal obstruction.  4. Chronic bilateral sacroiliitis.  5. Lower lumbar spondylosis.   Consultants:  Surgery  Code Status: Full Family Communication: Pt at bedside Disposition Plan: Discharge to [facility] perhaps in AM  HPI/Subjective: No events overnight.   Objective: Filed Vitals:   12/31/11 0730 12/31/11 0816 12/31/11 1439 12/31/11 1540  BP:   145/78   Pulse:  71 68 68  Temp:   97.9 F (36.6 C)   TempSrc:   Oral   Resp:  20 16 16   Height:  Weight:      SpO2: 100% 98% 98% 99%    Intake/Output Summary (Last 24 hours) at 12/31/11 1836 Last data filed at 12/31/11 1832  Gross per 24 hour  Intake   1145 ml  Output    504 ml  Net    641 ml    Exam:  General: Pt is  alert, follows commands appropriately, not in acute distress  HEENT: No icterus, No thrush, [place]/AT, tracheostomy tube in place  Cardiovascular: RRR, S1/S2, no rubs, no gallops  Respiratory: CTA bilaterally, no wheezing, no crackles, no rhonchi  Abdomen: TF site looks clean with no drainage and no tenderness to palpation  Extremities: 2+ edema, No lymphangitis, No petechiae, No rashes, no synovitis  Data Reviewed: Basic Metabolic Panel:  Lab 12/29/11 1610 12/28/11 0530 12/27/11 1809  NA 138 138 137  K 4.0 4.0 4.5  CL 99 100 100  CO2 32 32 34*  GLUCOSE 103* 102* 103*  BUN 57* 60* 59*  CREATININE 2.09* 2.00* 2.02*  CALCIUM 8.9 9.3 9.3  MG -- -- --  PHOS -- -- --   Liver Function Tests:  Lab 12/27/11 1809  AST 16  ALT 14  ALKPHOS 68  BILITOT 0.2*  PROT 6.3  ALBUMIN 2.3*   CBC:  Lab 12/29/11 0720 12/28/11 0530 12/27/11 1809  WBC 9.2 13.3* 12.0*  NEUTROABS 6.5 -- 9.0*  HGB 9.1* 8.9* 9.3*  HCT 28.1* 28.7* 29.7*  MCV 82.9 81.3 82.5  PLT 282 252 262   CBG:  Lab 12/31/11 1639 12/31/11 1127 12/30/11 2117 12/30/11 1559 12/30/11 1116  GLUCAP 112* 113* 89 78 79    Recent Results (from the past 240 hour(s))  CULTURE, BLOOD (ROUTINE X 2)     Status: Normal (Preliminary result)   Collection Time   12/27/11  7:55 PM      Component Value Range Status Comment   Specimen Description BLOOD ARM RIGHT   Final    Special Requests BOTTLES DRAWN AEROBIC AND ANAEROBIC 10CC   Final    Culture  Setup Time 12/28/2011 01:14   Final    Culture     Final    Value:        BLOOD CULTURE RECEIVED NO GROWTH TO DATE CULTURE WILL BE HELD FOR 5 DAYS BEFORE ISSUING A FINAL NEGATIVE REPORT   Report Status PENDING   Incomplete   CULTURE, BLOOD (ROUTINE X 2)     Status: Normal (Preliminary result)   Collection Time   12/27/11  8:00 PM      Component Value Range Status Comment   Specimen Description BLOOD ARM LEFT   Final    Special Requests BOTTLES DRAWN AEROBIC ONLY [facility]   Final    Culture  Setup  Time 12/28/2011 01:14   Final    Culture     Final    Value:        BLOOD CULTURE RECEIVED NO GROWTH TO DATE CULTURE WILL BE HELD FOR 5 DAYS BEFORE ISSUING A FINAL NEGATIVE REPORT   Report Status PENDING   Incomplete   URINE CULTURE     Status: Normal   Collection Time   12/28/11 12:53 PM      Component Value Range Status Comment   Specimen Description URINE, CLEAN CATCH   Final    Special Requests NONE   Final    Culture  Setup Time 12/28/2011 14:11   Final    Colony Count 55,000 COLONIES/ML   Final    Culture  PROTEUS MIRABILIS   Final    Report Status 12/30/2011 FINAL   Final    Organism ID, Bacteria PROTEUS MIRABILIS   Final      Scheduled Meds:   . ceFAZolin (ANCEF) IV  1 g Intravenous Q8H  . heparin  5,000 Units Subcutaneous Q8H  . metoCLOPramide  10 mg Per Tube Q6H  . nitroGLYCERIN  0.2 mg Transdermal Daily  . polyethylene glycol  17 g Oral BID  . scopolamine  1 patch Transdermal Q72H   Continuous Infusions:   . dextrose 5 % and 0.9% NaCl 1,000 mL (12/31/11 1645)  . [DISCONTINUED] feeding supplement (OSMOLITE 1.2 CAL)       Debbora Presto, MD  [facility] Pager [phone]  If 7PM-7AM, please contact night-coverage [email or web address] Password [email or web address] 12/31/2011, 6:36 PM   LOS: 4 days

## 2011-12-31 NOTE — Progress Notes (Signed)
Update note:  Saw patient and she just underwent a Modified Barium Swallow study and was recommended to start mechanical soft diet and liquids.  Stop tube feeds.  Pt will need to f/u with Derrell Lolling as an outpatient in 3-4 weeks to see about removal.    DORT, Oasis Goehring, PA-C, CCS 11:39 AM 12/31/2011

## 2011-12-31 NOTE — Progress Notes (Signed)
Clinical Social Work Department BRIEF PSYCHOSOCIAL ASSESSMENT 12/31/2011  Patient:  [name], [name]     Account Number:  [id number]     Admit date:  12/27/2011  Clinical Social Worker:  Juliette Mangle  Date/Time:  12/31/2011 01:00 PM  Referred by:  Physician  Date Referred:  12/30/2011 Referred for  SNF Placement   Other Referral:   Interview type:  Patient Other interview type:   CSW contacted patient's daughter [name]    PSYCHOSOCIAL DATA Living Status:  FACILITY Admitted from facility:  [facility] [place] Level of care:  Skilled Nursing Facility Primary support name:  Sunday Corn Primary support relationship to patient:  CHILD, ADULT Degree of support available:   Strong and Vested    CURRENT CONCERNS Current Concerns  Post-Acute Placement   Other Concerns:    SOCIAL WORK ASSESSMENT / PLAN Clinical Social Worker received referral that patient was admitted from [facility]. CSW reviewed chart and met with patient at bedside. CSW introduced self, explained role, and provided emotional support. CSW discussed returning to [facility] when medically stable. Patient was able to verbalize that she is agreeable to returning. Patient did request that CSW contact her daughter to make her aware. CSW contacted patient's daughter and left a message. CSW will continue to follow to assist with all d/c needs   Assessment/plan status:  Psychosocial Support/Ongoing Assessment of Needs Other assessment/ plan:   Information/referral to community resources:   none noted    PATIENT'S/FAMILY'S RESPONSE TO PLAN OF CARE: Patient was appreciative of support and information provided by CSW. Patient did request that CSW speak with patient's daughter. CSW contacted daughter and left message. CSW will continue to follow and will assist with all d/c planning needs   Sabino Niemann, MSW, LCSWA [phone]

## 2011-12-31 NOTE — Progress Notes (Signed)
CSW contacted [facility] to update them on the patient's status. Patient can return to [facility] over the weekend if medically stable for d/c. Please contact weekend CSW if patient is ready to [facility] to [facility], MSW, [place] [phone]

## 2011-12-31 NOTE — Progress Notes (Signed)
As above.

## 2011-12-31 NOTE — Progress Notes (Signed)
Nutrition Follow-up/consult   Intervention:   1. Ensure Complete po daily, each supplement provides 350 kcal and 13 grams of protein.  2. RD will continue to follow    Assessment:   Pt now back from [facility], SLP recommends diet advance to dysphagia 3 with thin liquids. TF to stop and have PEG removed at a later time.  Pt happy to start diet, willing to drink one Ensure daily until we know she can meet her nutrition needs with po intake.   Diet Order:  Currently NPO  Meds: Scheduled Meds:   .  ceFAZolin (ANCEF) IV  1 g Intravenous Q8H  . feeding supplement  30 mL Per Tube TID  . heparin  5,000 Units Subcutaneous Q8H  . metoCLOPramide  10 mg Per Tube Q6H  . nitroGLYCERIN  0.2 mg Transdermal Daily  . polyethylene glycol  17 g Oral BID  . scopolamine  1 patch Transdermal Q72H  . [DISCONTINUED] vancomycin  500 mg Intravenous Q24H   Continuous Infusions:   . dextrose 5 % and 0.9% NaCl 50 mL (12/30/11 1603)  . [DISCONTINUED] feeding supplement (OSMOLITE 1.2 CAL)     PRN Meds:.acetaminophen, acetaminophen, albuterol, antiseptic oral rinse, bisacodyl, HYDROcodone-acetaminophen, ipratropium, magnesium hydroxide, ondansetron (ZOFRAN) IV, ondansetron, sodium chloride   CMP     Component Value Date/Time   NA 138 12/29/2011 0720   K 4.0 12/29/2011 0720   CL 99 12/29/2011 0720   CO2 32 12/29/2011 0720   GLUCOSE 103* 12/29/2011 0720   BUN 57* 12/29/2011 0720   CREATININE 2.09* 12/29/2011 0720   CALCIUM 8.9 12/29/2011 0720   PROT 6.3 12/27/2011 1809   ALBUMIN 2.3* 12/27/2011 1809   AST 16 12/27/2011 1809   ALT 14 12/27/2011 1809   ALKPHOS 68 12/27/2011 1809   BILITOT 0.2* 12/27/2011 1809   GFRNONAA 23* 12/29/2011 0720   GFRAA 26* 12/29/2011 0720   Sodium  Date/Time Value Range Status  12/29/2011  7:20 AM 138  135 - 145 mEq/L Final  12/28/2011  5:30 AM 138  135 - 145 mEq/L Final  12/27/2011  6:09 PM 137  135 - 145 mEq/L Final    Potassium  Date/Time Value Range Status  12/29/2011  7:20 AM 4.0  3.5  - 5.1 mEq/L Final  12/28/2011  5:30 AM 4.0  3.5 - 5.1 mEq/L Final  12/27/2011  6:09 PM 4.5  3.5 - 5.1 mEq/L Final    Phosphorus  Date/Time Value Range Status  12/21/2011  7:33 AM 4.4  2.3 - 4.6 mg/dL Final  95/62/1308  6:57 AM 4.3  2.3 - 4.6 mg/dL Final  84/69/6295  2:84 AM 3.5  2.3 - 4.6 mg/dL Final    Magnesium  Date/Time Value Range Status  12/13/2011  6:44 AM 2.2  1.5 - 2.5 mg/dL Final  13/24/4010  2:72 AM 2.5  1.5 - 2.5 mg/dL Final  53/07/6438  3:47 AM 2.1  1.5 - 2.5 mg/dL Final     CBG (last 3)   Basename 12/31/11 1127 12/30/11 2117 12/30/11 1559  GLUCAP 113* 89 78     Intake/Output Summary (Last 24 hours) at 12/31/11 1216 Last data filed at 12/31/11 1208  Gross per 24 hour  Intake    935 ml  Output    728 ml  Net    207 ml    Weight Status:  162 lbs, up 2 lbs from admission  Re-estimated needs:  1780-1900 kcal, 43-57 gm protein   Nutrition Dx:  Inadequate oral intake r/t dysphagia AEB  PRO with PEG  Goal:  EN to meet >/=90% estimated protein and kcal needs.  --met [place]: PO intake to meet >/=90% estimated nutrition needs  Monitor:  PO intake, weight, labs   Clarene Duke RD, LDN Pager [phone] After Hours pager [phone]

## 2012-01-01 DIAGNOSIS — J449 Chronic obstructive pulmonary disease, unspecified: Secondary | ICD-10-CM

## 2012-01-01 DIAGNOSIS — J9610 Chronic respiratory failure, unspecified whether with hypoxia or hypercapnia: Secondary | ICD-10-CM

## 2012-01-01 LAB — CBC
MCH: 25.2 pg — ABNORMAL LOW (ref 26.0–34.0)
MCHC: 31.2 g/dL (ref 30.0–36.0)
Platelets: 296 10*3/uL (ref 150–400)
RDW: 16.6 % — ABNORMAL HIGH (ref 11.5–15.5)

## 2012-01-01 LAB — BASIC METABOLIC PANEL
Calcium: 9.2 mg/dL (ref 8.4–10.5)
GFR calc non Af Amer: 28 mL/min — ABNORMAL LOW (ref >90)
Sodium: 141 mEq/L (ref 135–145)

## 2012-01-01 MED ORDER — TORSEMIDE 20 MG PO TABS: 20.0000 mg | ORAL_TABLET | Freq: Every day | ORAL | Status: DC

## 2012-01-01 MED ORDER — ENSURE COMPLETE PO LIQD: 237.0000 mL | Freq: Three times a day (TID) | ORAL | Status: DC

## 2012-01-01 MED ORDER — ASPIRIN 81 MG PO CHEW: 81.0000 mg | CHEWABLE_TABLET | Freq: Every day | ORAL | Status: DC

## 2012-01-01 MED ORDER — OMEPRAZOLE 20 MG PO CPDR: 20.0000 mg | DELAYED_RELEASE_CAPSULE | Freq: Every day | ORAL | Status: DC

## 2012-01-01 NOTE — Discharge Summary (Signed)
Physician Discharge Summary  [name] ZOX:[id number] DOB: [birth year] DOA: 12/27/2011  PCP: Gwynneth Aliment, MD  Admit date: 12/27/2011 Discharge date: 01/01/2012  Time spent: 45 minutes  Recommendations for Outpatient Follow-up:  1. F/u at trach clinic - for consideration of decannulation 2. Continue aggressive PT/OT 3. Flush G tube every shift with sterile saline   Discharge Diagnoses:  Mild Abdominal wall cellulitis - resolved   Type 2 diabetes mellitus with vascular disease  Hyperlipdemia  Obesity (BMI 30-39.9)  Sleep apnea  C O P D  Chronic kidney disease (CKD), stage IV (severe)  Chronic respiratory failure due to OSA/OHs s/p tracheostomy   s/p Gastrostomy tube placement by general surgery 11/2011  Cirrhosis of liver Chronic systolic CHF    Discharge Condition: good, alert and oriented x3, able to speak,   Diet recommendation: dysphagia 3 diet,, thin liquids   Filed Weights   12/30/11 0506 12/31/11 0506 01/01/12 0619  Weight: 72.1 kg (158 lb 15.2 oz) 73.6 kg (162 lb 4.1 oz) 74.5 kg (164 lb 3.9 oz)    History of present illness:  This is a 71 year old female t tube placed last month. She's been doing well however, yesterday she developed some abdominal pain around the feeding tube and also generalized. Today she was noted to have mild purulent draining around the T-tube,she later developed a temperature of 101. She was sent to the ER. She denies any worsening shortness of breath, wheezing, significant cough. She denies any nausea, vomiting or diarrhea. In the ER she was noted to have cellulitis around her T tube. Request is made for admission. History provided by the patient.   Hospital Course:  Abdominal wall cellulitis  -Approximately 3-4 cm induration around gastrostomy tube site with no erythema  -CT abdomen ruled out abdominal wall fluid collection or abscess  -D/w CCS on 11/6 and placed TF on hold as feeds were leaking around the tube  -Started  vancomycin for staphylococcal infection more common with recent laparoscopic G-tube placement on December 21, 2011 and patient completed 5 days of iv abx in house.   Leukocytosis  -Likely due to cellulitis . Resolved   Chronic respiratory failure due to OSA/OHS/  -Status post tracheostomy (October 1) with supplemental oxygen  -Start patient back on scopolamine patch do to secretions  - passy muir valve trials OK  - patient to go to trach clinic at [facility] on discharge  Chronic systolic and diastolic heart failure  -Compensated at this time  -Ejection fraction 25-30%  -monitor daily weights and oxygen saturations  - to resume diuretic at discharge as she gained a couple of pounds in house  Dysphagia due to being on the vent and having trach  -Status post laparoscopic gastrostomy tube placement 12/21/2011  -swallow evaluation done and recommendation is to proceed with advancing diet as pt able to tolerate, soft solid for now  Anemia of chronic disease  -Hg and Hct stable  Chronic kidney disease stage IV  -Baseline creatinine between 1.6-1.9   Questionable diabetes mellitus type 2  -Hemoglobin A1c 4.2 on 11/26/2011  - stopped insulin   Bacteriuria  -without symptoms. No need to treat    Procedures: MBS Consultations:  CCS  Discharge Exam: Filed Vitals:   01/01/12 0041 01/01/12 0312 01/01/12 0619 01/01/12 0835  BP:   184/89   Pulse: 68 65 77 85  Temp:   97.1 F (36.2 C)   TempSrc:   Oral   Resp: 18 18 20 20   Height:  Weight:   74.5 kg (164 lb 3.9 oz)   SpO2: 97% 99% 100% 100%    General: axox3 Cardiovascular: RRR Respiratory: CTAB Abdomen, soft, NT Trach site clean G tube site without signs of infection   Discharge Instructions      Discharge Orders    Future Orders Please Complete By Expires   DIET DYS 3      Increase activity slowly          Medication List     As of 01/01/2012  9:59 AM    STOP taking these medications         albuterol 0.63  MG/3ML nebulizer solution   Commonly known as: ACCUNEB      atropine 1 % ophthalmic solution      feeding supplement Liqd      fluconazole 200 MG tablet   Commonly known as: DIFLUCAN      hydrALAZINE 25 MG tablet   Commonly known as: APRESOLINE      insulin aspart 100 UNIT/ML injection   Commonly known as: novoLOG      multivitamin with minerals Tabs      nitroGLYCERIN 0.4 mg/hr   Commonly known as: NITRODUR - Dosed in mg/24 hr      potassium chloride 20 MEQ/15ML (10%) solution      PROTONIX 40 mg/20 mL Pack   Generic drug: pantoprazole sodium      sennosides-docusate sodium 8.6-50 MG tablet   Commonly known as: SENOKOT-S      TAKE these medications         allopurinol 100 MG tablet   Commonly known as: ZYLOPRIM   Take 100 mg by mouth daily.      aspirin 81 MG chewable tablet   Chew 1 tablet (81 mg total) by mouth daily.      carvedilol 12.5 MG tablet   Commonly known as: COREG   Take 12.5 mg by mouth 2 (two) times daily with a meal.      cholecalciferol 1000 UNITS tablet   Commonly known as: VITAMIN D   Take 2,000 Units by mouth daily.      escitalopram 5 MG tablet   Commonly known as: LEXAPRO   Take 7.5 mg by mouth daily.      feeding supplement Liqd   Take 237 mLs by mouth 3 (three) times daily between meals.      ipratropium-albuterol 0.5-2.5 (3) MG/3ML Soln   Commonly known as: DUONEB   Take 3 mLs by nebulization every 6 (six) hours as needed. For shortness of breath      omeprazole 20 MG capsule   Commonly known as: PRILOSEC   Take 1 capsule (20 mg total) by mouth daily.      pravastatin 40 MG tablet   Commonly known as: PRAVACHOL   Take 40 mg by mouth daily.      scopolamine 1.5 MG   Commonly known as: TRANSDERM-SCOP   Place 1 patch onto the skin every 3 (three) days.      torsemide 20 MG tablet   Commonly known as: DEMADEX   Take 1 tablet (20 mg total) by mouth daily.        Follow-up Information    Follow up with [facility] clinic -  Dr. Tyson Alias .   Contact information:   [phone] Call for appointment       Follow up with Lajean Saver, MD. Schedule an appointment as soon as possible for a visit in 4 weeks.  Contact information:   [address] [place] [address] [phone]           The results of significant diagnostics from this hospitalization (including imaging, microbiology, ancillary and laboratory) are listed below for reference.    Significant Diagnostic Studies: Ct Abdomen Pelvis Wo Contrast  12/28/2011  *RADIOLOGY REPORT*  Clinical Data: Cellulitis in the abdominal wall surrounding the indwelling gastrostomy tube.  CT ABDOMEN AND PELVIS WITHOUT CONTRAST  Technique:  Multidetector CT imaging of the abdomen and pelvis was performed following the standard protocol without intravenous contrast.  Comparison: CT abdomen and pelvis 08/09/2008.  Findings: Edema/inflammation in the left lateral abdominal wall, including the left lateral abdominal musculature.  No visible fluid collections surrounding the gastrostomy tube.  Gastrostomy tube appropriately positioned within the proximal body of the stomach.  Mild elevation of the left hemidiaphragm.  Within the limits of the unenhanced technique, no focal parenchymal abnormality involving the liver; enlargement of the left lobe has progressed since the prior examination.  Normal unenhanced appearance of the spleen, pancreas, and gallbladder.  No biliary ductal dilation.  Bilateral low attenuation adrenal masses, measuring approximately 3.3 x 2.0 cm on the right and 2.6 x 3.7 cm on the left, unchanged.  Numerous bilateral renal cysts, the largest arising from the lower pole of the right kidney measuring approximately 8.5 x 6.7 cm, increased in size since the prior examination.  Extensive arterial calcification involving the intrarenal branches bilaterally.  Two adjacent tiny calculi in an upper pole calix of the left kidney without evidence of  obstruction.  No right urinary tract calculi.  Extensive aorto- iliofemoral atherosclerosis without aneurysm.  No significant lymphadenopathy.  Small hiatal hernia, unchanged.  Stomach decompressed and otherwise unremarkable.  Normal appearing small bowel.  Liquid stool throughout normal caliber colon.  No ascites.  Uterine fibroids, some which are calcified and degenerated.  No adnexal masses.  No free pelvic fluid.  Bone window images demonstrate osteopenia and degenerative changes throughout the lower thoracic and lumbar spine and both sacroiliac joints. Visualized right lung base clear.  Marked cardiomegaly, with the heart taking of the entire visualized base of the left hemithorax.  IMPRESSION:  1.  Cellulitis involving the left lateral abdominal wall with myositis involving the lateral abdominal musculature.  No evidence of abscess. 2.  No other acute abnormalities involving the abdomen or pelvis. No abscess surrounding the gastrostomy tube which is appropriately positioned in the proximal body of the stomach. 3.  Hepatic cirrhosis suspected, as there is relative enlargement of the left lobe of the liver which has progressed since 2010.  4.  Stable bilateral adrenal adenomas. 5.  Multiple bilateral renal cysts. 6.  Uterine fibroids, some of which are calcified and degenerated. 7.  Adjacent tiny non-obstructing calculi in an upper pole calix of the left kidney.   Original Report Authenticated By: Hulan Saas, M.D.    Ir Fluoro Rm 30-60 Min  12/14/2011  *RADIOLOGY REPORT*  Clinical Data: Dysphasia, in need of gastrostomy tube placement for supplementation of nutrition.  IR FLOURO RM 0-60 MIN  Comparison: Abdominal radiograph - earlier same day; abdominal CT - 08/07/2008  Findings:  Preprocedural spot radiograph demonstrates redundant transverse colon overlying the expected location of the gastric fundus.  Ultrasound scanning was performed to demarcate the lateral and of the left lobe of the liver.   Scissors were placed to demarcate the desired location for gastrostomy tube placement.  Despite the insufflation of the stomach with air, the redundant transverse colon  remained overlying the percutaneous access site. As such, the procedure was cancelled.  IMPRESSION: Cancelled percutaneous gastrostomy tube placement secondary to interposed transverse colon.  Would recommend surgical consultation for surgically placed gastrostomy tube.  Above findings discussed with Dr. Elias Else at the time of procedure cancellation.   Original Report Authenticated By: Waynard Reeds, M.D.    Dg Chest Portable 1 View  12/20/2011  *RADIOLOGY REPORT*  Clinical Data: Congestive heart failure  PORTABLE CHEST - 1 VIEW  Comparison: 11/30/2011  Findings: Tracheostomy appears well positioned.  Soft feeding tube has its tip in the distal esophagus.  There is cardiomegaly.  There is left lower lobe collapse and there is partial collapse in the right lower lobe.  There is probably pleural fluid on the left.  IMPRESSION: Feeding tube tip only in the distal esophagus.  This finding is being made  a call report.  Worsened basilar volume loss, more extensive on the left than the right.   Original Report Authenticated By: Thomasenia Sales, M.D.    Dg Abd Acute W/chest  12/27/2011  *RADIOLOGY REPORT*  Clinical Data: Abdominal pain.  Discharge from peg tube.  Shortness of breath.  Diabetes.  Hypertension.  ACUTE ABDOMEN SERIES (ABDOMEN 2 VIEW & CHEST 1 VIEW)  Comparison: 12/20/2011  Findings: Tracheostomy tube projects over the tracheal air shadow.  Stable appearance of cardiomegaly with apparent volume loss in the left lung causing leftward deviation of cardiac and mediastinal contours.  A band of opacity is present in the left upper lobe just above the cardiac shadow, and there is complete obscuration of the left hemidiaphragm, presumably from left lower lobe airspace opacity.  Left side down lateral decubitus view of the abdomen demonstrates  air fluid levels in the colon and mildly dilated loops of left abdominal small bowel.  There is evidence of chronic bilateral sacroiliitis.  Lower lumbar spondylosis noted.  Vascular calcifications are present in the upper abdomen.  IMPRESSION:  1.  Cardiomegaly. 2.  Suspected atelectasis in the left lower lobe and lingula, causing leftward shift of cardiac and mediastinal structures. Entities such as a central obstructing lesion cannot be excluded, and chest CT should be considered if this atelectasis persists. 3.  Air-fluid levels and loops of large bowel and mildly dilated small bowel, abnormal but technically nonspecific.  The appearance could reflect ileus or low-level distal obstruction. 4.  Chronic bilateral sacroiliitis. 5.  Lower lumbar spondylosis.   Original Report Authenticated By: Gaylyn Rong, M.D.    Dg Abd Portable 1v  12/14/2011  *RADIOLOGY REPORT*  Clinical Data: Feeding tube placement  PORTABLE ABDOMEN - 1 VIEW  Comparison: Abdominal film 12/14/2011  Findings: There is a feeding tube with weighted tip in the gastric body.  Barium within the colon.  IMPRESSION: Feeding tube with tip in the stomach.   Original Report Authenticated By: Genevive Bi, M.D.    Dg Abd Portable 1v  12/14/2011  *RADIOLOGY REPORT*  Clinical Data: Evaluate barium prior to potential percutaneous gastrostomy tube placement  PORTABLE ABDOMEN - 1 VIEW  Comparison: 12/02/2011  Findings:  Administered enteric contrast is seen within the cecum and ascending colon to the level of the hepatic flexure.  Air is seen within the transverse colon to the level of the splenic flexure.  Nonobstructive bowel gas pattern.  Evaluation for pneumoperitoneum is degraded secondary to supine patient positioned exclusion of the lower thorax.  An enteric tube overlies the left upper abdominal quadrant.  Lumbar spine degenerative change.  IMPRESSION: Enteric contrast  seen within the cecum and ascending colon to the level of the hepatic  flexure.   Original Report Authenticated By: Waynard Reeds, M.D.     Microbiology: Recent Results (from the past 240 hour(s))  CULTURE, BLOOD (ROUTINE X 2)     Status: Normal (Preliminary result)   Collection Time   12/27/11  7:55 PM      Component Value Range Status Comment   Specimen Description BLOOD ARM RIGHT   Final    Special Requests BOTTLES DRAWN AEROBIC AND ANAEROBIC 10CC   Final    Culture  Setup Time 12/28/2011 01:14   Final    Culture     Final    Value:        BLOOD CULTURE RECEIVED NO GROWTH TO DATE CULTURE WILL BE HELD FOR 5 DAYS BEFORE ISSUING A FINAL NEGATIVE REPORT   Report Status PENDING   Incomplete   CULTURE, BLOOD (ROUTINE X 2)     Status: Normal (Preliminary result)   Collection Time   12/27/11  8:00 PM      Component Value Range Status Comment   Specimen Description BLOOD ARM LEFT   Final    Special Requests BOTTLES DRAWN AEROBIC ONLY 7CC   Final    Culture  Setup Time 12/28/2011 01:14   Final    Culture     Final    Value:        BLOOD CULTURE RECEIVED NO GROWTH TO DATE CULTURE WILL BE HELD FOR 5 DAYS BEFORE ISSUING A FINAL NEGATIVE REPORT   Report Status PENDING   Incomplete   URINE CULTURE     Status: Normal   Collection Time   12/28/11 12:53 PM      Component Value Range Status Comment   Specimen Description URINE, CLEAN CATCH   Final    Special Requests NONE   Final    Culture  Setup Time 12/28/2011 14:11   Final    Colony Count 55,000 COLONIES/ML   Final    Culture PROTEUS MIRABILIS   Final    Report Status 12/30/2011 FINAL   Final    Organism ID, Bacteria PROTEUS MIRABILIS   Final      Labs: Basic Metabolic Panel:  Lab 01/01/12 1610 12/29/11 0720 12/28/11 0530 12/27/11 1809  NA 141 138 138 137  K 3.8 4.0 4.0 4.5  CL 105 99 100 100  CO2 28 32 32 34*  GLUCOSE 107* 103* 102* 103*  BUN 33* 57* 60* 59*  CREATININE 1.74* 2.09* 2.00* 2.02*  CALCIUM 9.2 8.9 9.3 9.3  MG -- -- -- --  PHOS -- -- -- --   Liver Function Tests:  Lab 12/27/11 1809   AST 16  ALT 14  ALKPHOS 68  BILITOT 0.2*  PROT 6.3  ALBUMIN 2.3*   No results found for this basename: LIPASE:5,AMYLASE:5 in the last 168 hours No results found for this basename: AMMONIA:5 in the last 168 hours CBC:  Lab 01/01/12 0434 12/29/11 0720 12/28/11 0530 12/27/11 1809  WBC 9.1 9.2 13.3* 12.0*  NEUTROABS -- 6.5 -- 9.0*  HGB 9.7* 9.1* 8.9* 9.3*  HCT 31.1* 28.1* 28.7* 29.7*  MCV 80.8 82.9 81.3 82.5  PLT 296 282 252 262   Cardiac Enzymes: No results found for this basename: CKTOTAL:5,CKMB:5,CKMBINDEX:5,TROPONINI:5 in the last 168 hours BNP: BNP (last 3 results)  Basename 12/27/11 1810 12/21/11 0733 12/19/11 0630  PROBNP 7141.0* 4011.0* 4544.0*   CBG:  Lab 12/31/11 2151 12/31/11 1639 12/31/11 1127 12/30/11  2117 12/30/11 1559  GLUCAP 148* 112* 113* 89 78       Signed:  Taylon Coole  [facility] 01/01/2012, 9:59 AM

## 2012-01-01 NOTE — Progress Notes (Signed)
CSW was consulted to complete discharge of patient. Pt to transfer to [facility] today via [facility]. [facility] is aware of d/c. CSW contacted patient's daughter, to inform of d/c but daughter did not have her voicemail set up. D/C packet complete with chart copy, signed FL2, and signed hard Rx.  CSW signing off as no other CSW needs identified at this time.  Lia Foyer, LCSWA [facility] Clinical Social Worker Contact #: [phone] (weekend)

## 2012-01-03 ENCOUNTER — Emergency Department (HOSPITAL_COMMUNITY)
Admission: EM | Admit: 2012-01-03 | Discharge: 2012-01-04 | Disposition: A | Discharge status: Home / Self Care | Payer: Medicare Other | Attending: Emergency Medicine | Admitting: Emergency Medicine

## 2012-01-03 ENCOUNTER — Encounter (HOSPITAL_COMMUNITY): Payer: Self-pay | Admitting: *Deleted

## 2012-01-03 ENCOUNTER — Emergency Department (HOSPITAL_COMMUNITY): Payer: Medicare Other

## 2012-01-03 DIAGNOSIS — Z862 Personal history of diseases of the blood and blood-forming organs and certain disorders involving the immune mechanism: Secondary | ICD-10-CM | POA: Insufficient documentation

## 2012-01-03 DIAGNOSIS — E785 Hyperlipidemia, unspecified: Secondary | ICD-10-CM | POA: Insufficient documentation

## 2012-01-03 DIAGNOSIS — M129 Arthropathy, unspecified: Secondary | ICD-10-CM | POA: Insufficient documentation

## 2012-01-03 DIAGNOSIS — E669 Obesity, unspecified: Secondary | ICD-10-CM | POA: Insufficient documentation

## 2012-01-03 DIAGNOSIS — Z8679 Personal history of other diseases of the circulatory system: Secondary | ICD-10-CM | POA: Insufficient documentation

## 2012-01-03 DIAGNOSIS — G4730 Sleep apnea, unspecified: Secondary | ICD-10-CM | POA: Insufficient documentation

## 2012-01-03 DIAGNOSIS — Z7982 Long term (current) use of aspirin: Secondary | ICD-10-CM | POA: Insufficient documentation

## 2012-01-03 DIAGNOSIS — I119 Hypertensive heart disease without heart failure: Secondary | ICD-10-CM | POA: Insufficient documentation

## 2012-01-03 DIAGNOSIS — J449 Chronic obstructive pulmonary disease, unspecified: Secondary | ICD-10-CM | POA: Insufficient documentation

## 2012-01-03 DIAGNOSIS — N289 Disorder of kidney and ureter, unspecified: Secondary | ICD-10-CM

## 2012-01-03 DIAGNOSIS — Z8673 Personal history of transient ischemic attack (TIA), and cerebral infarction without residual deficits: Secondary | ICD-10-CM | POA: Insufficient documentation

## 2012-01-03 DIAGNOSIS — E119 Type 2 diabetes mellitus without complications: Secondary | ICD-10-CM | POA: Insufficient documentation

## 2012-01-03 DIAGNOSIS — Z8639 Personal history of other endocrine, nutritional and metabolic disease: Secondary | ICD-10-CM | POA: Insufficient documentation

## 2012-01-03 DIAGNOSIS — Z87891 Personal history of nicotine dependence: Secondary | ICD-10-CM | POA: Insufficient documentation

## 2012-01-03 DIAGNOSIS — Z79899 Other long term (current) drug therapy: Secondary | ICD-10-CM | POA: Insufficient documentation

## 2012-01-03 DIAGNOSIS — K219 Gastro-esophageal reflux disease without esophagitis: Secondary | ICD-10-CM | POA: Insufficient documentation

## 2012-01-03 DIAGNOSIS — I509 Heart failure, unspecified: Secondary | ICD-10-CM | POA: Insufficient documentation

## 2012-01-03 DIAGNOSIS — J4489 Other specified chronic obstructive pulmonary disease: Secondary | ICD-10-CM | POA: Insufficient documentation

## 2012-01-03 DIAGNOSIS — I279 Pulmonary heart disease, unspecified: Secondary | ICD-10-CM | POA: Insufficient documentation

## 2012-01-03 DIAGNOSIS — R0602 Shortness of breath: Secondary | ICD-10-CM

## 2012-01-03 DIAGNOSIS — N184 Chronic kidney disease, stage 4 (severe): Secondary | ICD-10-CM | POA: Insufficient documentation

## 2012-01-03 LAB — CBC WITH DIFFERENTIAL/PLATELET
Basophils Relative: 0 % (ref 0–1)
Eosinophils Absolute: 0.2 10*3/uL (ref 0.0–0.7)
Eosinophils Relative: 1 % (ref 0–5)
HCT: 35.6 % — ABNORMAL LOW (ref 36.0–46.0)
Hemoglobin: 11.6 g/dL — ABNORMAL LOW (ref 12.0–15.0)
MCH: 26.2 pg (ref 26.0–34.0)
MCHC: 32.6 g/dL (ref 30.0–36.0)
MCV: 80.4 fL (ref 78.0–100.0)
Monocytes Absolute: 1.1 10*3/uL — ABNORMAL HIGH (ref 0.1–1.0)
Monocytes Relative: 6 % (ref 3–12)

## 2012-01-03 LAB — CULTURE, BLOOD (ROUTINE X 2): Culture: NO GROWTH

## 2012-01-03 MED ORDER — IPRATROPIUM BROMIDE 0.02 % IN SOLN
0.5000 mg | Freq: Once | RESPIRATORY_TRACT | Status: AC
  Administered 2012-01-03: 0.5 mg via RESPIRATORY_TRACT
  Filled 2012-01-03: qty 2.5

## 2012-01-03 MED ORDER — ALBUTEROL SULFATE (5 MG/ML) 0.5% IN NEBU
5.0000 mg | INHALATION_SOLUTION | Freq: Once | RESPIRATORY_TRACT | Status: AC
  Administered 2012-01-03: 5 mg via RESPIRATORY_TRACT
  Filled 2012-01-03: qty 1

## 2012-01-03 MED ORDER — METHYLPREDNISOLONE SODIUM SUCC 125 MG IJ SOLR
125.0000 mg | Freq: Once | INTRAMUSCULAR | Status: AC
  Administered 2012-01-03: 125 mg via INTRAVENOUS
  Filled 2012-01-03: qty 2

## 2012-01-03 NOTE — ED Provider Notes (Signed)
History     CSN: [id number]  Arrival date & time 01/03/12  2241   First MD Initiated Contact with Patient 01/03/12 2257      Chief Complaint  Patient presents with  . Shortness of Breath   Level V caveat for limited verbal communication  (Consider location/radiation/quality/duration/timing/severity/associated sxs/prior treatment) HPI  Patient presents via an EMS Patient has a trach and only mouths her responses. She indicates she started getting shortness of breath today. She denies cough, chest pain or wheezing. She states she's never felt this way before. She's unsure she's having a fever. The symptoms started around 7 PM tonight. She states she does have some shortness of breath. She denies having any pain. EMS gave her a nebulizer in route which she states made her feel mildly better.   PCP Dr. Velna Hatchet  Past Medical History  Diagnosis Date  . COPD (chronic obstructive pulmonary disease)     on home o2  . Chronic renal insufficiency   . CVA (cerebral infarction)     hx  . Hypertensive heart disease with congestive heart failure   . Chronic cor pulmonale   . Cardiomyopathy of undetermined type 09/07/2011  . Chronic kidney disease (CKD), stage IV (severe)   . Hyperlipdemia   . Obesity (BMI 30-39.9)   . Sleep apnea   . History of gout 09/07/2011  . Heart failure 7/13  . Shortness of breath   . Anemia   . Vascular disease   . Asthma   . Anginal pain   . Hypertension   . Type II or unspecified type diabetes mellitus without mention of complication, not stated as uncontrolled   . Stroke   . GERD (gastroesophageal reflux disease)   . Arthritis     Past Surgical History  Procedure Date  . Bilateral oophorectomy   . Cardiac catheterization     right heart cath  . Laparoscopic gastrostomy 12/21/2011    Procedure: LAPAROSCOPIC GASTROSTOMY;  Surgeon: Axel Filler, MD;  Location: [facility] OR;  Service: General;  Laterality: N/A;  laparoscopic gastrostomy possible open  feeding tube  . Abdominal hysterectomy   . Tracheostomy 11/2011  . Cataracts     Family History  Problem Relation Age of Onset  . Heart attack Father   . Stroke Mother     CVA  . Coronary artery disease Daughter     History  Substance Use Topics  . Smoking status: Former Smoker -- 1.0 packs/day for 55 years    Types: Cigarettes    Quit date: 08/28/2011  . Smokeless tobacco: Never Used  . Alcohol Use: No  lives in NH Oxygen 2 lpm [place]  OB History    Grav Para Term Preterm Abortions TAB SAB Ect Mult Living                  Review of Systems  Unable to perform ROS   Allergies  Sulfonamide derivatives  Home Medications   Current Outpatient Rx  Name  Route  Sig  Dispense  Refill  . ALLOPURINOL 100 MG PO TABS   Tube   Give 100 mg by tube daily.          . ASPIRIN 81 MG PO CHEW   Tube   Give 81 mg by tube daily.         Marland Kitchen CARVEDILOL 12.5 MG PO TABS   Oral   Take 12.5 mg by mouth 2 (two) times daily with a meal.         .  VITAMIN D 1000 UNITS PO TABS   Tube   Give 2,000 Units by tube daily.          Marland Kitchen ESCITALOPRAM OXALATE 5 MG PO TABS   Oral   Take 7.5 mg by mouth daily.         Marland Kitchen ENSURE COMPLETE PO LIQD   Oral   Take 237 mLs by mouth 3 (three) times daily between meals.         . IPRATROPIUM-ALBUTEROL 0.5-2.5 (3) MG/3ML IN SOLN   Nebulization   Take 3 mLs by nebulization every 6 (six) hours as needed. For shortness of breath         . OMEPRAZOLE 20 MG PO CPDR   Tube   Give 20 mg by tube daily.         Marland Kitchen PRAVASTATIN SODIUM 40 MG PO TABS   Tube   Give 40 mg by tube daily.          . SCOPOLAMINE BASE 1.5 MG TD PT72   Transdermal   Place 1 patch onto the skin every 3 (three) days.         . TORSEMIDE 20 MG PO TABS   Tube   Give 20 mg by tube daily.         . TUBERCULIN PPD 5 UNIT/0.1ML ID SOLN   Intradermal   Inject 5 Units into the skin once.           BP 149/83  Pulse 79  Temp 98.3 F (36.8 C) (Oral)  Resp 30   SpO2 100%  Vital signs normal    Physical Exam  Nursing note and vitals reviewed. Constitutional: She is oriented to person, place, and time. She appears well-developed and well-nourished.  Non-toxic appearance. She does not appear ill. No distress.  HENT:  Head: Normocephalic and atraumatic.  Right Ear: External ear normal.  Left Ear: External ear normal.  Nose: Nose normal. No mucosal edema or rhinorrhea.  Mouth/Throat: Oropharynx is clear and moist and mucous membranes are normal. No dental abscesses or uvula swelling.  Eyes: Conjunctivae normal and EOM are normal. Pupils are equal, round, and reactive to light.  Neck: Normal range of motion and full passive range of motion without pain. Neck supple.       Trach in place, small amount yellow drainage on gauze around trach  Cardiovascular: Normal rate, regular rhythm and normal heart sounds.  Exam reveals no gallop and no friction rub.   No murmur heard. Pulmonary/Chest: Effort normal. No respiratory distress. She has decreased breath sounds. She has no wheezes. She has no rhonchi. She has no rales. She exhibits no tenderness and no crepitus.  Abdominal: Soft. Normal appearance and bowel sounds are normal. She exhibits no distension. There is no tenderness. There is no rebound and no guarding.  Musculoskeletal: Normal range of motion. She exhibits no edema and no tenderness.       Moves all extremities well.   Neurological: She is alert and oriented to person, place, and time. She has normal strength. No cranial nerve deficit.  Skin: Skin is warm, dry and intact. No rash noted. No erythema. No pallor.  Psychiatric: She has a normal mood and affect. Her speech is normal and behavior is normal. Her mood appears not anxious.    ED Course  Procedures (including critical care time)  Medications  albuterol (PROVENTIL) (5 MG/ML) 0.5% nebulizer solution 5 mg (5 mg Nebulization Given 01/03/12 2347)  ipratropium (ATROVENT) nebulizer  solution  0.5 mg (0.5 mg Nebulization Given 01/03/12 2347)  methylPREDNISolone sodium succinate (SOLU-MEDROL) 125 mg/2 mL injection 125 mg (125 mg Intravenous Given 01/03/12 2337)  technetium albumin aggregated (MAA) injection solution 3 milli Curie (3 milli Curie Intravenous Contrast Given 01/04/12 0440)  xenon xe 133 gas 17 milli Curie (17 milli Curie Inhalation Contrast Given 01/04/12 0410)    Pt more comfortable after meds.  08:00 Pt turned over at change of shift to get chest CT scan.   Results for orders placed during the hospital encounter of 01/03/12  CBC WITH DIFFERENTIAL      Component Value Range   WBC 18.4 (*) 4.0 - 10.5 K/uL   RBC 4.43  3.87 - 5.11 MIL/uL   Hemoglobin 11.6 (*) 12.0 - 15.0 g/dL   HCT 04.5 (*) 40.9 - 81.1 %   MCV 80.4  78.0 - 100.0 fL   MCH 26.2  26.0 - 34.0 pg   MCHC 32.6  30.0 - 36.0 g/dL   RDW 91.4 (*) 78.2 - 95.6 %   Platelets 279  150 - 400 K/uL   Neutrophils Relative 81 (*) 43 - 77 %   Neutro Abs 14.9 (*) 1.7 - 7.7 K/uL   Lymphocytes Relative 12  12 - 46 %   Lymphs Abs 2.2  0.7 - 4.0 K/uL   Monocytes Relative 6  3 - 12 %   Monocytes Absolute 1.1 (*) 0.1 - 1.0 K/uL   Eosinophils Relative 1  0 - 5 %   Eosinophils Absolute 0.2  0.0 - 0.7 K/uL   Basophils Relative 0  0 - 1 %   Basophils Absolute 0.0  0.0 - 0.1 K/uL  COMPREHENSIVE METABOLIC PANEL      Component Value Range   Sodium 134 (*) 135 - 145 mEq/L   Potassium 4.4  3.5 - 5.1 mEq/L   Chloride 98  96 - 112 mEq/L   CO2 28  19 - 32 mEq/L   Glucose, Bld 129 (*) 70 - 99 mg/dL   BUN 28 (*) 6 - 23 mg/dL   Creatinine, Ser 2.13 (*) 0.50 - 1.10 mg/dL   Calcium 9.4  8.4 - 08.6 mg/dL   Total Protein 7.1  6.0 - 8.3 g/dL   Albumin 2.6 (*) 3.5 - 5.2 g/dL   AST 22  0 - 37 U/L   ALT 12  0 - 35 U/L   Alkaline Phosphatase 86  39 - 117 U/L   Total Bilirubin 0.2 (*) 0.3 - 1.2 mg/dL   GFR calc non Af Amer 24 (*) >90 mL/min   GFR calc Af Amer 28 (*) >90 mL/min  TROPONIN I      Component Value Range    Troponin I <0.30  <0.30 ng/mL  PRO B NATRIURETIC PEPTIDE      Component Value Range   Pro B Natriuretic peptide (BNP) 9069.0 (*) 0 - 125 pg/mL  APTT      Component Value Range   aPTT 30  24 - 37 seconds  PROTIME-INR      Component Value Range   Prothrombin Time 13.5  11.6 - 15.2 seconds   INR 1.04  0.00 - 1.49  D-DIMER, QUANTITATIVE      Component Value Range   D-Dimer, Quant 6.32 (*) 0.00 - 0.48 ug/mL-FEU  URINALYSIS, ROUTINE W REFLEX MICROSCOPIC      Component Value Range   Color, Urine STRAW (*) YELLOW   APPearance CLEAR  CLEAR   Specific Gravity, Urine 1.010  1.005 -  1.030   pH 6.5  5.0 - 8.0   Glucose, UA NEGATIVE  NEGATIVE mg/dL   Hgb urine dipstick LARGE (*) NEGATIVE   Bilirubin Urine NEGATIVE  NEGATIVE   Ketones, ur NEGATIVE  NEGATIVE mg/dL   Protein, ur >161 (*) NEGATIVE mg/dL   Urobilinogen, UA 0.2  0.0 - 1.0 mg/dL   Nitrite NEGATIVE  NEGATIVE   Leukocytes, UA TRACE (*) NEGATIVE  URINE MICROSCOPIC-ADD ON      Component Value Range   Squamous Epithelial / LPF RARE  RARE   WBC, UA 0-2  <3 WBC/hpf   RBC / HPF 0-2  <3 RBC/hpf   Bacteria, UA FEW (*) RARE   Laboratory interpretation all normal except leukocytosis, elevated ddimer,renal insuffic  Dg Chest Portable 1 View  01/03/2012  *RADIOLOGY REPORT*  Clinical Data: Shortness of breath  PORTABLE CHEST - 1 VIEW  Comparison: 12/27/2011  Findings: Tracheostomy tube tip is in place as above the carina.Stable cardiac enlargement.  Lung volumes are low.  No pleural effusion or edema.  Significantly improved aeration to the left base is noted.  Mild residual plate-like atelectasis is noted.  IMPRESSION:  1.  Improved aeration to the left base.  Mild plate-like atelectasis remains. 2.  Stable cardiac enlargement.   Original Report Authenticated By: Signa Kell, M.D.    Nm Pulmonary Perf And Vent  01/04/2012  *RADIOLOGY REPORT*  Clinical Data: Shortness of breath.  Elevated D-dimer.  Renal insufficiency.  NM PULMONARY  VENTILATION AND PERFUSION SCAN  Radiopharmaceutical: CURIE MAA TECHNETIUM TO 77M ALBUMIN AGGREGATED, CURIE xenon xe 133 gas 17 milli Curie XENON XE 133 GAS  Comparison: 01/03/2012  Findings: Perfusion images demonstrate prominently reduced activity throughout the left lung.  Right lung demonstrates normal perfusion.  Ventilation images demonstrate reduced activity throughout the left lung as well, with normal right on ventilation.  IMPRESSION:  1.  Absent perfusion and poor ventilation in the entire left lung. This matched whole lung defect is considered low probability (10- 19% risk of pulmonary embolus) by PIOPED II criteria.  It can occur in the setting of central obstruction, and in some settings can be a sign of obstructing lung cancer.  Consider noncontrast CT the chest.   Original Report Authenticated By: Gaylyn Rong, M.D.       Date: 01/04/2012  Rate: 92  Rhythm: normal sinus rhythm and ventricular bigiminy  QRS Axis: left  Intervals: normal  ST/T Wave abnormalities: normal  Conduction Disutrbances:LVH   Narrative Interpretation:   Old EKG Reviewed: none available      1. Shortness of breath   2. Renal insufficiency     Disposition per Dr Jill Poling, MD, FACEP   MDM          Ward Givens, MD 01/04/12 [phone]

## 2012-01-03 NOTE — ED Notes (Signed)
Pt in from [facility] via [facility] EMS, pt c/o SOB onset 22:00, pt denies CP, pt A&O x4, follows commands, speaks in short sentences, pt received 5mg  Albuterol in route via Trach on 8L in route by EMS

## 2012-01-04 ENCOUNTER — Emergency Department (HOSPITAL_COMMUNITY): Payer: Medicare Other

## 2012-01-04 LAB — URINALYSIS, ROUTINE W REFLEX MICROSCOPIC
Bilirubin Urine: NEGATIVE
Specific Gravity, Urine: 1.01 (ref 1.005–1.030)
Urobilinogen, UA: 0.2 mg/dL (ref 0.0–1.0)

## 2012-01-04 LAB — COMPREHENSIVE METABOLIC PANEL
Albumin: 2.6 g/dL — ABNORMAL LOW (ref 3.5–5.2)
BUN: 28 mg/dL — ABNORMAL HIGH (ref 6–23)
Calcium: 9.4 mg/dL (ref 8.4–10.5)
Chloride: 98 mEq/L (ref 96–112)
Creatinine, Ser: 2.01 mg/dL — ABNORMAL HIGH (ref 0.50–1.10)
Total Bilirubin: 0.2 mg/dL — ABNORMAL LOW (ref 0.3–1.2)
Total Protein: 7.1 g/dL (ref 6.0–8.3)

## 2012-01-04 LAB — PRO B NATRIURETIC PEPTIDE: Pro B Natriuretic peptide (BNP): 9069 pg/mL — ABNORMAL HIGH (ref 0–125)

## 2012-01-04 LAB — TROPONIN I: Troponin I: <0.3 ng/mL (ref <0.30)

## 2012-01-04 LAB — URINE MICROSCOPIC-ADD ON

## 2012-01-04 LAB — PROTIME-INR: INR: 1.04 (ref 0.00–1.49)

## 2012-01-04 MED ORDER — AZITHROMYCIN 250 MG PO TABS: ORAL_TABLET | ORAL | Status: DC

## 2012-01-04 MED ORDER — XENON XE 133 GAS
17.0000 | GAS_FOR_INHALATION | Freq: Once | RESPIRATORY_TRACT | Status: AC | PRN
  Administered 2012-01-04: 17 via RESPIRATORY_TRACT

## 2012-01-04 MED ORDER — TECHNETIUM TO 99M ALBUMIN AGGREGATED
3.0000 | Freq: Once | INTRAVENOUS | Status: AC | PRN
  Administered 2012-01-04: 3 via INTRAVENOUS

## 2012-01-04 NOTE — ED Notes (Signed)
Patient transported to nuclear medicine for perf and vent scan

## 2012-01-04 NOTE — ED Notes (Signed)
[facility] called for transport 

## 2012-01-04 NOTE — ED Notes (Signed)
Respiratory at bedside.

## 2012-01-04 NOTE — ED Notes (Signed)
[facility] arrived to transport pt to living center.

## 2012-01-04 NOTE — Progress Notes (Signed)
RT called to ED to suction trach patient.  Patients inner cannula had a lot of secretions in it.  RT cleaned inner cannula and suctioned patient again.  Secretions were thick tan and a small amount.

## 2012-01-04 NOTE — ED Provider Notes (Signed)
Discussed VQ scan and CT chest with the radiologist. Poor perfusion and ventilation of left lung most likely related to note position of tracheostomy tube. Patient is in no acute distress on physical exam.  She has good color and is not dyspneic. Will Rx Zithromax as an outpatient.  Donnetta Hutching, MD 01/04/12 1359

## 2012-01-04 NOTE — ED Notes (Addendum)
Soft diet tray ordered per MD Adriana Simas approval

## 2012-01-04 NOTE — ED Notes (Signed)
Pt returned from radiology.

## 2012-01-04 NOTE — ED Notes (Signed)
Pt daughter at bedside

## 2012-01-25 ENCOUNTER — Encounter (HOSPITAL_COMMUNITY): Payer: Self-pay | Admitting: Emergency Medicine

## 2012-01-25 ENCOUNTER — Inpatient Hospital Stay (HOSPITAL_COMMUNITY)
Admission: EM | Admit: 2012-01-25 | Discharge: 2012-01-31 | DRG: 205 | Disposition: A | Discharge status: Skilled Nursing Facility | Payer: Medicare Other | Attending: Pulmonary Disease | Admitting: Pulmonary Disease

## 2012-01-25 DIAGNOSIS — I428 Other cardiomyopathies: Secondary | ICD-10-CM | POA: Diagnosis present

## 2012-01-25 DIAGNOSIS — K7460 Unspecified cirrhosis of liver: Secondary | ICD-10-CM

## 2012-01-25 DIAGNOSIS — L0390 Cellulitis, unspecified: Secondary | ICD-10-CM

## 2012-01-25 DIAGNOSIS — I5023 Acute on chronic systolic (congestive) heart failure: Secondary | ICD-10-CM

## 2012-01-25 DIAGNOSIS — Z23 Encounter for immunization: Secondary | ICD-10-CM

## 2012-01-25 DIAGNOSIS — J9509 Other tracheostomy complication: Principal | ICD-10-CM | POA: Diagnosis present

## 2012-01-25 DIAGNOSIS — I2789 Other specified pulmonary heart diseases: Secondary | ICD-10-CM

## 2012-01-25 DIAGNOSIS — I429 Cardiomyopathy, unspecified: Secondary | ICD-10-CM

## 2012-01-25 DIAGNOSIS — Y9210 Unspecified residential institution as the place of occurrence of the external cause: Secondary | ICD-10-CM | POA: Diagnosis present

## 2012-01-25 DIAGNOSIS — D649 Anemia, unspecified: Secondary | ICD-10-CM

## 2012-01-25 DIAGNOSIS — Z87891 Personal history of nicotine dependence: Secondary | ICD-10-CM

## 2012-01-25 DIAGNOSIS — L03311 Cellulitis of abdominal wall: Secondary | ICD-10-CM

## 2012-01-25 DIAGNOSIS — I2510 Atherosclerotic heart disease of native coronary artery without angina pectoris: Secondary | ICD-10-CM

## 2012-01-25 DIAGNOSIS — J9620 Acute and chronic respiratory failure, unspecified whether with hypoxia or hypercapnia: Secondary | ICD-10-CM | POA: Diagnosis present

## 2012-01-25 DIAGNOSIS — Z7982 Long term (current) use of aspirin: Secondary | ICD-10-CM

## 2012-01-25 DIAGNOSIS — Z8673 Personal history of transient ischemic attack (TIA), and cerebral infarction without residual deficits: Secondary | ICD-10-CM

## 2012-01-25 DIAGNOSIS — I452 Bifascicular block: Secondary | ICD-10-CM

## 2012-01-25 DIAGNOSIS — N179 Acute kidney failure, unspecified: Secondary | ICD-10-CM

## 2012-01-25 DIAGNOSIS — F17200 Nicotine dependence, unspecified, uncomplicated: Secondary | ICD-10-CM

## 2012-01-25 DIAGNOSIS — I509 Heart failure, unspecified: Secondary | ICD-10-CM | POA: Diagnosis present

## 2012-01-25 DIAGNOSIS — J9602 Acute respiratory failure with hypercapnia: Secondary | ICD-10-CM

## 2012-01-25 DIAGNOSIS — D638 Anemia in other chronic diseases classified elsewhere: Secondary | ICD-10-CM | POA: Diagnosis present

## 2012-01-25 DIAGNOSIS — J4489 Other specified chronic obstructive pulmonary disease: Secondary | ICD-10-CM

## 2012-01-25 DIAGNOSIS — I129 Hypertensive chronic kidney disease with stage 1 through stage 4 chronic kidney disease, or unspecified chronic kidney disease: Secondary | ICD-10-CM | POA: Diagnosis present

## 2012-01-25 DIAGNOSIS — I5032 Chronic diastolic (congestive) heart failure: Secondary | ICD-10-CM | POA: Diagnosis present

## 2012-01-25 DIAGNOSIS — K219 Gastro-esophageal reflux disease without esophagitis: Secondary | ICD-10-CM | POA: Diagnosis present

## 2012-01-25 DIAGNOSIS — M109 Gout, unspecified: Secondary | ICD-10-CM

## 2012-01-25 DIAGNOSIS — J9500 Unspecified tracheostomy complication: Secondary | ICD-10-CM

## 2012-01-25 DIAGNOSIS — E669 Obesity, unspecified: Secondary | ICD-10-CM

## 2012-01-25 DIAGNOSIS — Z6830 Body mass index (BMI) 30.0-30.9, adult: Secondary | ICD-10-CM

## 2012-01-25 DIAGNOSIS — I279 Pulmonary heart disease, unspecified: Secondary | ICD-10-CM

## 2012-01-25 DIAGNOSIS — M7989 Other specified soft tissue disorders: Secondary | ICD-10-CM

## 2012-01-25 DIAGNOSIS — J9610 Chronic respiratory failure, unspecified whether with hypoxia or hypercapnia: Secondary | ICD-10-CM

## 2012-01-25 DIAGNOSIS — J449 Chronic obstructive pulmonary disease, unspecified: Secondary | ICD-10-CM | POA: Diagnosis present

## 2012-01-25 DIAGNOSIS — Y833 Surgical operation with formation of external stoma as the cause of abnormal reaction of the patient, or of later complication, without mention of misadventure at the time of the procedure: Secondary | ICD-10-CM | POA: Diagnosis present

## 2012-01-25 DIAGNOSIS — I110 Hypertensive heart disease with heart failure: Secondary | ICD-10-CM

## 2012-01-25 DIAGNOSIS — J9819 Other pulmonary collapse: Secondary | ICD-10-CM | POA: Diagnosis present

## 2012-01-25 DIAGNOSIS — J45909 Unspecified asthma, uncomplicated: Secondary | ICD-10-CM

## 2012-01-25 DIAGNOSIS — I5021 Acute systolic (congestive) heart failure: Secondary | ICD-10-CM

## 2012-01-25 DIAGNOSIS — Z823 Family history of stroke: Secondary | ICD-10-CM

## 2012-01-25 DIAGNOSIS — E119 Type 2 diabetes mellitus without complications: Secondary | ICD-10-CM | POA: Diagnosis present

## 2012-01-25 DIAGNOSIS — E1159 Type 2 diabetes mellitus with other circulatory complications: Secondary | ICD-10-CM

## 2012-01-25 DIAGNOSIS — Z8249 Family history of ischemic heart disease and other diseases of the circulatory system: Secondary | ICD-10-CM

## 2012-01-25 DIAGNOSIS — E785 Hyperlipidemia, unspecified: Secondary | ICD-10-CM

## 2012-01-25 DIAGNOSIS — N189 Chronic kidney disease, unspecified: Secondary | ICD-10-CM | POA: Diagnosis present

## 2012-01-25 DIAGNOSIS — Z79899 Other long term (current) drug therapy: Secondary | ICD-10-CM

## 2012-01-25 DIAGNOSIS — Z882 Allergy status to sulfonamides status: Secondary | ICD-10-CM

## 2012-01-25 DIAGNOSIS — G4733 Obstructive sleep apnea (adult) (pediatric): Secondary | ICD-10-CM

## 2012-01-25 DIAGNOSIS — N184 Chronic kidney disease, stage 4 (severe): Secondary | ICD-10-CM

## 2012-01-25 MED ORDER — SIMVASTATIN 20 MG PO TABS
20.0000 mg | ORAL_TABLET | Freq: Every day | ORAL | Status: DC
  Administered 2012-01-26 – 2012-01-30 (×5): 20 mg via ORAL
  Filled 2012-01-25 (×6): qty 1

## 2012-01-25 MED ORDER — ESCITALOPRAM OXALATE 5 MG PO TABS
7.5000 mg | ORAL_TABLET | Freq: Every day | ORAL | Status: DC
  Filled 2012-01-25 (×2): qty 2

## 2012-01-25 MED ORDER — ALLOPURINOL 100 MG PO TABS
100.0000 mg | ORAL_TABLET | Freq: Every day | ORAL | Status: DC
  Administered 2012-01-26 – 2012-01-31 (×6): 100 mg via ORAL
  Filled 2012-01-25 (×6): qty 1

## 2012-01-25 MED ORDER — PANTOPRAZOLE SODIUM 40 MG PO TBEC
40.0000 mg | DELAYED_RELEASE_TABLET | Freq: Every day | ORAL | Status: DC
  Administered 2012-01-26 – 2012-01-31 (×6): 40 mg via ORAL
  Filled 2012-01-25 (×5): qty 1
  Filled 2012-01-25: qty 11

## 2012-01-25 MED ORDER — SCOPOLAMINE 1 MG/3DAYS TD PT72
1.0000 | MEDICATED_PATCH | TRANSDERMAL | Status: DC
  Administered 2012-01-26 – 2012-01-29 (×2): 1.5 mg via TRANSDERMAL
  Filled 2012-01-25 (×2): qty 1

## 2012-01-25 MED ORDER — CARVEDILOL 12.5 MG PO TABS
12.5000 mg | ORAL_TABLET | Freq: Two times a day (BID) | ORAL | Status: DC
  Administered 2012-01-26 – 2012-01-31 (×11): 12.5 mg via ORAL
  Filled 2012-01-25 (×14): qty 1

## 2012-01-25 MED ORDER — TORSEMIDE 20 MG PO TABS
20.0000 mg | ORAL_TABLET | Freq: Every day | ORAL | Status: DC
  Administered 2012-01-26 – 2012-01-31 (×6): 20 mg via ORAL
  Filled 2012-01-25 (×6): qty 1

## 2012-01-25 MED ORDER — PRO-STAT SUGAR FREE PO LIQD: 30.0000 mL | Freq: Three times a day (TID) | ORAL | Status: DC

## 2012-01-25 MED ORDER — VITAMIN D3 25 MCG (1000 UNIT) PO TABS
2000.0000 [IU] | ORAL_TABLET | Freq: Every day | ORAL | Status: DC
  Administered 2012-01-26 – 2012-01-31 (×6): 2000 [IU] via ORAL
  Filled 2012-01-25 (×6): qty 2

## 2012-01-25 MED ORDER — ENSURE COMPLETE PO LIQD: 237.0000 mL | Freq: Three times a day (TID) | ORAL | Status: DC

## 2012-01-25 MED ORDER — ALBUTEROL SULFATE (5 MG/ML) 0.5% IN NEBU: 2.5000 mg | INHALATION_SOLUTION | RESPIRATORY_TRACT | Status: DC | PRN

## 2012-01-25 MED ORDER — ASPIRIN 81 MG PO CHEW
81.0000 mg | CHEWABLE_TABLET | Freq: Every day | ORAL | Status: DC
  Administered 2012-01-26 – 2012-01-31 (×6): 81 mg via ORAL
  Filled 2012-01-25 (×6): qty 1

## 2012-01-25 MED ORDER — IPRATROPIUM BROMIDE 0.02 % IN SOLN
0.5000 mg | RESPIRATORY_TRACT | Status: DC | PRN
  Filled 2012-01-25 (×2): qty 2.5

## 2012-01-25 NOTE — ED Provider Notes (Signed)
History     CSN: [id number]  Arrival date & time 01/25/12  2025   First MD Initiated Contact with Patient 01/25/12 2042      Chief Complaint  Patient presents with  . Tracheostomy Tube Change    HPI  Tracheostomy complication Onset - today Course - stable Worsened by - nothing Improved by - nothing  Pt with h/o trach placement in October.  She reports that she has had increased cough and she thinks she dislodged her trach.  She denies any new chest pain or shortness of breath.    Past Medical History  Diagnosis Date  . COPD (chronic obstructive pulmonary disease)     on home o2  . Chronic renal insufficiency   . CVA (cerebral infarction)     hx  . Hypertensive heart disease with congestive heart failure   . Chronic cor pulmonale   . Cardiomyopathy of undetermined type 09/07/2011  . Chronic kidney disease (CKD), stage IV (severe)   . Hyperlipdemia   . Obesity (BMI 30-39.9)   . Sleep apnea   . History of gout 09/07/2011  . Heart failure 7/13  . Shortness of breath   . Anemia   . Vascular disease   . Asthma   . Anginal pain   . Hypertension   . Type II or unspecified type diabetes mellitus without mention of complication, not stated as uncontrolled   . Stroke   . GERD (gastroesophageal reflux disease)   . Arthritis     Past Surgical History  Procedure Date  . Bilateral oophorectomy   . Cardiac catheterization     right heart cath  . Laparoscopic gastrostomy 12/21/2011    Procedure: LAPAROSCOPIC GASTROSTOMY;  Surgeon: Axel Filler, MD;  Location: [facility] OR;  Service: General;  Laterality: N/A;  laparoscopic gastrostomy possible open feeding tube  . Abdominal hysterectomy   . Tracheostomy 11/2011  . Cataracts     Family History  Problem Relation Age of Onset  . Heart attack Father   . Stroke Mother     CVA  . Coronary artery disease Daughter     History  Substance Use Topics  . Smoking status: Former Smoker -- 1.0 packs/day for 55 years    Types:  Cigarettes    Quit date: 08/28/2011  . Smokeless tobacco: Never Used  . Alcohol Use: No    OB History    Grav Para Term Preterm Abortions TAB SAB Ect Mult Living                  Review of Systems  Constitutional: Negative for fever.  Respiratory: Positive for cough. Negative for shortness of breath.   Cardiovascular: Negative for chest pain.  Gastrointestinal: Negative for vomiting.  Psychiatric/Behavioral: Negative for agitation.  All other systems reviewed and are negative.    Allergies  Sulfonamide derivatives  Home Medications   Current Outpatient Rx  Name  Route  Sig  Dispense  Refill  . ALLOPURINOL 100 MG PO TABS   Tube   Give 100 mg by tube daily.          . ASPIRIN 81 MG PO CHEW   Tube   Give 81 mg by tube daily.         . AZITHROMYCIN 250 MG PO TABS      2 tablets day one, one tablet days 2 through 5   6 each   0   . CARVEDILOL 12.5 MG PO TABS   Oral   Take  12.5 mg by mouth 2 (two) times daily with a meal.         . VITAMIN D 1000 UNITS PO TABS   Tube   Give 2,000 Units by tube daily.          Marland Kitchen ESCITALOPRAM OXALATE 5 MG PO TABS   Oral   Take 7.5 mg by mouth daily.         Marland Kitchen ENSURE COMPLETE PO LIQD   Oral   Take 237 mLs by mouth 3 (three) times daily between meals.         . IPRATROPIUM-ALBUTEROL 0.5-2.5 (3) MG/3ML IN SOLN   Nebulization   Take 3 mLs by nebulization every 6 (six) hours as needed. For shortness of breath         . OMEPRAZOLE 20 MG PO CPDR   Tube   Give 20 mg by tube daily.         Marland Kitchen PRAVASTATIN SODIUM 40 MG PO TABS   Tube   Give 40 mg by tube daily.          . SCOPOLAMINE BASE 1.5 MG TD PT72   Transdermal   Place 1 patch onto the skin every 3 (three) days.         . TORSEMIDE 20 MG PO TABS   Tube   Give 20 mg by tube daily.         . TUBERCULIN PPD 5 UNIT/0.1ML ID SOLN   Intradermal   Inject 5 Units into the skin once.           BP 190/94  Pulse 75  SpO2 100% BP 180/78  Pulse 75   Temp 97.6 F (36.4 C) (Oral)  SpO2 100%   Physical Exam CONSTITUTIONAL: Well developed/well nourished HEAD AND FACE: Normocephalic/atraumatic EYES: EOMI/PERRL ENMT: Mucous membranes moist NECK: supple no meningeal signs.  Trach in place.  It is partially in place.  Small amt of yellowish discharge near trach.  No blood is noted.   CV:  no murmurs/rubs/gallops noted LUNGS: coarse breath sounds noted bilaterally but no distress, able to speak to me clearly ABDOMEN: soft  NEURO: Pt is awake/alert SKIN: warm, color normal PSYCH: no abnormalities of mood noted  ED Course  Procedures  8:52 PM Pt with multiple medical problems here for concern for trach dislodgment It was placed in early October.  She is in no distress.  There is no hypoxia.  It does not appear amenable to easy replacement.  It is a 4 cuffless shiley.  Will call critical care for guidance 9:32 PM D/w critical care to see patient.  Pt currently stable.  No distress or hypoxia.  She reports she is not on vent at night 10:09 PM Critical care has removed trach.  Will continue to monitor Critical care to admit for OBS.  Pt stable, no distress, no hypoxia after trach removal   MDM  Nursing notes including past medical history and social history reviewed and considered in documentation Previous records reviewed and considered    Date: 01/25/2012  Rate: 71  Rhythm: normal sinus rhythm  QRS Axis: left  Intervals: normal  ST/T Wave abnormalities: nonspecific ST changes  Conduction Disutrbances:nonspecific intraventricular conduction delay         Joya Gaskins, MD 01/25/12 2225

## 2012-01-25 NOTE — ED Notes (Signed)
Per [facility] - [facility]. EMS was called d/t trach dislodgement. Pt O2 sats 98% on 4L/[place]. Pt reports she has been coughing a lot lately. Pt was on 2L/min [place] upon arrival with O2 sats at 94%. Pt denies SOB & CP. Pt has hx of CHF. Pt uses and [place] during the day and uses the trach at night while sleeping. HR 78 BP 180/98 RR 18

## 2012-01-25 NOTE — ED Notes (Signed)
RT and Dr. Herma Carson at bedside to assess pt.

## 2012-01-25 NOTE — ED Notes (Signed)
Dr. Bebe Shaggy at bedside talking with pt.

## 2012-01-25 NOTE — ED Notes (Signed)
RT at bedside, unable to replace trach. Dr. Bebe Shaggy attempted same but unable to slide trach back into place. Pt stable, O2 sats 100% on 4L/min [place]. Pt skin warm dry and pink.

## 2012-01-25 NOTE — ED Notes (Addendum)
Dr. Herma Carson removed trach from patients tracheostomy. Pt's vital signs remained stable. Pt kept on 2L/min [place]

## 2012-01-25 NOTE — ED Notes (Signed)
Pt reports she is still feeling slightly SOB but overall feels better. Pt is able to talk in full sentences and doesn't have labored breathing anymore.

## 2012-01-25 NOTE — H&P (Signed)
PULMONARY  / CRITICAL CARE MEDICINE  Name: [name] MRN: [id number] DOB: 1940/08/24    LOS: 0  REFERRING PROVIDER:  EDP  CHIEF COMPLAINT:  Dislodged tracheostomy tube.  BRIEF PATIENT DESCRIPTION: 71 yo with COPD / OSA / recurrent left lung collapse / post intubation vocal cord edema  and percutaneous tracheostomy placed 10/10 by Dr. Tyson Alias brought to [facility] on 12/3 after tracheostomy was dislodged in the nursing home.  In ED: tracheostomy tube appears out of the tract with stoma completely closed.  Patient denies any shortness of breath.  LINES / TUBES: NA  CULTURES: NA  ANTIBIOTICS: NA  SIGNIFICANT EVENTS:  12/3  Brought to [facility] ED with dislodged tracheostomy tube  LEVEL OF CARE:  SDU observation  PRIMARY SERVICE:  PCCM  CONSULTANTS:  NA  CODE STATUS:  Full  DIET:  NPO  DVT Px:  SCDs  GI Px:  Not indicated  HISTORY OF PRESENT ILLNESS:  71 yo with COPD / OSA / recurrent left lung collapse / post intubation vocal cord edema  and percutaneous tracheostomy placed 10/10 by Dr. Tyson Alias brought to [facility] on 12/3 after tracheostomy was dislodged in the nursing home.  In ED: tracheostomy tube appears out of the tract with stoma completely closed.  Patient denies any shortness of breath.  PAST MEDICAL HISTORY :  Past Medical History  Diagnosis Date  . COPD (chronic obstructive pulmonary disease)     on home o2  . Chronic renal insufficiency   . CVA (cerebral infarction)     hx  . Hypertensive heart disease with congestive heart failure   . Chronic cor pulmonale   . Cardiomyopathy of undetermined type 09/07/2011  . Chronic kidney disease (CKD), stage IV (severe)   . Hyperlipdemia   . Obesity (BMI 30-39.9)   . Sleep apnea   . History of gout 09/07/2011  . Heart failure 7/13  . Shortness of breath   . Anemia   . Vascular disease   . Asthma   . Anginal pain   . Hypertension   . Type II or unspecified type diabetes mellitus without mention of complication, not  stated as uncontrolled   . Stroke   . GERD (gastroesophageal reflux disease)   . Arthritis    Past Surgical History  Procedure Date  . Bilateral oophorectomy   . Cardiac catheterization     right heart cath  . Laparoscopic gastrostomy 12/21/2011    Procedure: LAPAROSCOPIC GASTROSTOMY;  Surgeon: Axel Filler, MD;  Location: [facility] OR;  Service: General;  Laterality: N/A;  laparoscopic gastrostomy possible open feeding tube  . Abdominal hysterectomy   . Tracheostomy 11/2011  . Cataracts    Prior to Admission medications   Medication Sig Start Date End Date Taking? Authorizing Provider  allopurinol (ZYLOPRIM) 100 MG tablet Take 100 mg by mouth daily.    Yes Historical Provider, MD  aspirin 81 MG chewable tablet Chew 81 mg by mouth daily.    Yes Historical Provider, MD  carvedilol (COREG) 12.5 MG tablet Take 12.5 mg by mouth 2 (two) times daily with a meal.   Yes Historical Provider, MD  cholecalciferol (VITAMIN D) 1000 UNITS tablet Take 2,000 Units by mouth daily.    Yes Historical Provider, MD  escitalopram (LEXAPRO) 5 MG tablet Take 7.5 mg by mouth daily.   Yes Historical Provider, MD  feeding supplement (ENSURE COMPLETE) LIQD Take 237 mLs by mouth 3 (three) times daily between meals. 01/01/12  Yes Sorin Luanne Bras, MD  feeding supplement (  PRO-STAT SUGAR FREE 64) LIQD Take 30 mLs by mouth 3 (three) times daily with meals.   Yes Historical Provider, MD  omeprazole (PRILOSEC) 20 MG capsule Take 20 mg by mouth daily.    Yes Historical Provider, MD  pravastatin (PRAVACHOL) 40 MG tablet Take 40 mg by mouth every evening.    Yes Historical Provider, MD  scopolamine (TRANSDERM-SCOP) 1.5 MG Place 1 patch onto the skin every 3 (three) days.   Yes Historical Provider, MD  torsemide (DEMADEX) 20 MG tablet Take 20 mg by mouth daily.    Yes Historical Provider, MD   Allergies  Allergen Reactions  . Sulfonamide Derivatives Hives and Rash   FAMILY HISTORY:  Family History  Problem Relation Age of Onset   . Heart attack Father   . Stroke Mother     CVA  . Coronary artery disease Daughter    SOCIAL HISTORY:  reports that she quit smoking about 4 months ago. Her smoking use included Cigarettes. She has a 55 pack-year smoking history. She has never used smokeless tobacco. She reports that she does not drink alcohol or use illicit drugs.  REVIEW OF SYSTEMS:  Negative, except as in HPI  INTERVAL HISTORY:   VITAL SIGNS: Temp:  [97.6 F (36.4 C)] 97.6 F (36.4 C) (12/03 2137) Pulse Rate:  [75] 75  (12/03 2137) BP: (180-190)/(78-94) 180/78 mmHg (12/03 2137) SpO2:  [100 %] 100 % (12/03 2137)  PHYSICAL EXAMINATION: General:  Comfortable, no distress Neuro:  Awake, alert, oriented HEENT:  PERRL Neck:  Stoma closed, trachea midline, small amount of nonpurulent discharge Cardiovascular:  RRR Lungs:  Bilateral diminished breath sounds, no stridor Abdomen:  Soft Musculoskeletal:  Moves all extremities Skin:  No edema  LABS: No results found for this basename: NA:3,K:3,CL:3,CO2:3,BUN:3,CREATININE:3,GLUCOSE:3 in the last 168 hours No results found for this basename: HGB:3,HCT:3,WBC:3,PLT:3 in the last 168 hours No results found.  IMAGING: No results found.  ASSESSMENT / PLAN:  Dislodged tracheostomy tube, no signs of distress or compromised airway COPD OSA CHF CAD Obesity  -->  Admit to SDU for observation, will notify Dr. Tyson Alias in AM -->  NPO -->  Resume home medication / oxygen -->  SCDs for DVT Px  Lonia Farber  Pulmonary and Critical Care Medicine [facility] Pager: [phone]  01/25/2012, 10:24 PM

## 2012-01-26 ENCOUNTER — Observation Stay (HOSPITAL_COMMUNITY): Payer: Medicare Other

## 2012-01-26 ENCOUNTER — Inpatient Hospital Stay (HOSPITAL_COMMUNITY): Admission: RE | Admit: 2012-01-26 | Payer: Medicare Other | Source: Ambulatory Visit

## 2012-01-26 DIAGNOSIS — I5023 Acute on chronic systolic (congestive) heart failure: Secondary | ICD-10-CM

## 2012-01-26 DIAGNOSIS — J9600 Acute respiratory failure, unspecified whether with hypoxia or hypercapnia: Secondary | ICD-10-CM

## 2012-01-26 DIAGNOSIS — N189 Chronic kidney disease, unspecified: Secondary | ICD-10-CM

## 2012-01-26 DIAGNOSIS — L03319 Cellulitis of trunk, unspecified: Secondary | ICD-10-CM

## 2012-01-26 DIAGNOSIS — N179 Acute kidney failure, unspecified: Secondary | ICD-10-CM

## 2012-01-26 LAB — BLOOD GAS, ARTERIAL
Acid-Base Excess: 4.7 mmol/L — ABNORMAL HIGH (ref 0.0–2.0)
Bicarbonate: 32.1 mEq/L — ABNORMAL HIGH (ref 20.0–24.0)
TCO2: 34.7 mmol/L (ref 0–100)
pCO2 arterial: 81.3 mmHg (ref 35.0–45.0)
pH, Arterial: 7.217 — ABNORMAL LOW (ref 7.350–7.450)

## 2012-01-26 LAB — CBC WITH DIFFERENTIAL/PLATELET
Eosinophils Relative: 2 % (ref 0–5)
HCT: 33.1 % — ABNORMAL LOW (ref 36.0–46.0)
Lymphs Abs: 1.4 10*3/uL (ref 0.7–4.0)
MCH: 25.6 pg — ABNORMAL LOW (ref 26.0–34.0)
MCV: 85.5 fL (ref 78.0–100.0)
Monocytes Absolute: 0.6 10*3/uL (ref 0.1–1.0)
Monocytes Relative: 7 % (ref 3–12)
Neutro Abs: 5.7 10*3/uL (ref 1.7–7.7)
Platelets: 170 10*3/uL (ref 150–400)
RBC: 3.87 MIL/uL (ref 3.87–5.11)
RDW: 17.4 % — ABNORMAL HIGH (ref 11.5–15.5)
WBC: 7.9 10*3/uL (ref 4.0–10.5)

## 2012-01-26 LAB — POCT I-STAT 3, ART BLOOD GAS (G3+)
Acid-Base Excess: 7 mmol/L — ABNORMAL HIGH (ref 0.0–2.0)
Bicarbonate: 35.1 mEq/L — ABNORMAL HIGH (ref 20.0–24.0)
O2 Saturation: 98 %
Patient temperature: 97.6
pO2, Arterial: 115 mmHg — ABNORMAL HIGH (ref 80.0–100.0)

## 2012-01-26 LAB — BASIC METABOLIC PANEL
CO2: 32 mEq/L (ref 19–32)
Calcium: 9.3 mg/dL (ref 8.4–10.5)
Chloride: 103 mEq/L (ref 96–112)
Creatinine, Ser: 1.62 mg/dL — ABNORMAL HIGH (ref 0.50–1.10)
Glucose, Bld: 96 mg/dL (ref 70–99)

## 2012-01-26 LAB — GLUCOSE, CAPILLARY: Glucose-Capillary: 81 mg/dL (ref 70–99)

## 2012-01-26 MED ORDER — WHITE PETROLATUM GEL
Status: AC
  Administered 2012-01-26: 17:00:00
  Filled 2012-01-26: qty 5

## 2012-01-26 MED ORDER — ESCITALOPRAM OXALATE 10 MG PO TABS
10.0000 mg | ORAL_TABLET | Freq: Every day | ORAL | Status: DC
  Administered 2012-01-26 – 2012-01-31 (×6): 10 mg via ORAL
  Filled 2012-01-26 (×6): qty 1

## 2012-01-26 MED ORDER — CHLORHEXIDINE GLUCONATE 0.12 % MT SOLN
15.0000 mL | Freq: Two times a day (BID) | OROMUCOSAL | Status: DC
  Administered 2012-01-26 – 2012-01-31 (×6): 15 mL via OROMUCOSAL
  Filled 2012-01-26 (×12): qty 15

## 2012-01-26 MED ORDER — BIOTENE DRY MOUTH MT LIQD
15.0000 mL | Freq: Four times a day (QID) | OROMUCOSAL | Status: DC
  Administered 2012-01-27 – 2012-01-31 (×11): 15 mL via OROMUCOSAL

## 2012-01-26 NOTE — Progress Notes (Signed)
SLP Cancellation Note  Patient Details Name: JIYAH TORPEY MRN: [id number] DOB: 1940-02-28   Cancelled treatment:        Patient transferred to 2100, and on Bipap with possible intubation pending.  Will s/o for now.  Probable replacement of trach tomorrow, per RT.  Please reorder Swallow evaluation when appropriate.   Maryjo Rochester T 01/26/2012, 2:49 PM

## 2012-01-26 NOTE — H&P (Signed)
PULMONARY  / CRITICAL CARE MEDICINE  Name: [name] MRN: [id number] DOB: Oct 08, 1940    LOS: 1  REFERRING PROVIDER:  EDP  CHIEF COMPLAINT:  Dislodged tracheostomy tube.  BRIEF PATIENT DESCRIPTION: 71 yo with COPD / OSA / recurrent left lung collapse / post intubation vocal cord edema  and percutaneous tracheostomy placed 10/10 by Dr. Tyson Alias brought to [facility] on 12/3 after tracheostomy was dislodged in the nursing home.  In ED: tracheostomy tube appears out of the tract with stoma completely closed.  Patient denies any shortness of breath.  LINES / TUBES: NA  CULTURES: NA  ANTIBIOTICS: NA  SIGNIFICANT EVENTS:  12/3  Brought to [facility] ED with dislodged tracheostomy tube 12/4 - increased rr, no secretions  LEVEL OF CARE:  SDU observation  PRIMARY SERVICE:  PCCM  CONSULTANTS:  NA  CODE STATUS:  Full  DIET:  NPO  DVT Px:  SCDs  GI Px:  Not indicated   INTERVAL HISTORY:  Increase rr  VITAL SIGNS: Temp:  [97.1 F (36.2 C)-97.8 F (36.6 C)] 97.7 F (36.5 C) (12/04 0759) Pulse Rate:  [67-100] 78  (12/04 1000) Resp:  [13-27] 23  (12/04 1000) BP: (148-190)/(65-94) 148/65 mmHg (12/04 1000) SpO2:  [100 %] 100 % (12/04 0759) Weight:  [83.7 kg (184 lb 8.4 oz)] 83.7 kg (184 lb 8.4 oz) (12/04 0159)  PHYSICAL EXAMINATION: General:  Comfortable, rr rises with exertion Neuro:  Awake, alert, oriented HEENT:  PERRL Neck:  Stoma closed, trachea midline, small amount granualtion Cardiovascular:  RRR Lungs:  Bilateral diminished breath sounds, no stridor, speaking well Abdomen:  Soft Musculoskeletal:  Moves all extremities Skin:  No edema  LABS: No results found for this basename: NA:3,K:3,CL:3,CO2:3,BUN:3,CREATININE:3,GLUCOSE:3 in the last 168 hours No results found for this basename: HGB:3,HCT:3,WBC:3,PLT:3 in the last 168 hours No results found.  IMAGING: No results found.  ASSESSMENT / PLAN:  Dislodged tracheostomy tube, no signs of distress or compromised  airway COPD OSA CHF CAD Obesity HTN  -->  Continue to observe in 2600 -continue O2 support pcxr now for atx -pcx rin am  -chem , cbc now -control HTn , may need a prn med -continue diet, may nee repeat slp with trach out, wil lorder Favor no repeat trach clinically thus far   Nelda Bucks  Pulmonary and Critical Care Medicine [facility] Pager: [phone]  01/26/2012, 10:39 AM

## 2012-01-26 NOTE — Progress Notes (Signed)
CRITICAL VALUE ALERT  Critical value received:  ABG results; ph 7.21, CO2 81.3, O2 67.3, HCO3 32.1  Date of notification:  01/26/12  Time of notification:  1250  Critical value read back:yes  Nurse who received alert:  Lora Havens  MD notified (1st page): Tyson Alias  Time of first page:  1255  MD notified (2nd page):  Time of second page:  Responding MD:  Tyson Alias  Time MD responded:  1256

## 2012-01-26 NOTE — Progress Notes (Signed)
Pt was transported to 2100 at approximately 1400.  Nurse called report to [place].  Pt's daughter was at beside during transport and all of pts belongings were carried with her.  Pt was transported on BIPAP with IVF infusing.

## 2012-01-26 NOTE — ED Notes (Signed)
Report called to [place], RN on 2600.

## 2012-01-26 NOTE — ED Notes (Signed)
Preparing pt for transport.

## 2012-01-27 ENCOUNTER — Inpatient Hospital Stay (HOSPITAL_COMMUNITY): Payer: Medicare Other

## 2012-01-27 ENCOUNTER — Encounter (HOSPITAL_COMMUNITY): Payer: Medicare Other

## 2012-01-27 ENCOUNTER — Encounter (INDEPENDENT_AMBULATORY_CARE_PROVIDER_SITE_OTHER): Payer: Medicare Other | Admitting: General Surgery

## 2012-01-27 DIAGNOSIS — J449 Chronic obstructive pulmonary disease, unspecified: Secondary | ICD-10-CM

## 2012-01-27 DIAGNOSIS — I279 Pulmonary heart disease, unspecified: Secondary | ICD-10-CM

## 2012-01-27 DIAGNOSIS — J9620 Acute and chronic respiratory failure, unspecified whether with hypoxia or hypercapnia: Secondary | ICD-10-CM

## 2012-01-27 DIAGNOSIS — N184 Chronic kidney disease, stage 4 (severe): Secondary | ICD-10-CM

## 2012-01-27 LAB — BLOOD GAS, ARTERIAL
Bicarbonate: 31.8 mEq/L — ABNORMAL HIGH (ref 20.0–24.0)
TCO2: 33.3 mmol/L (ref 0–100)
pCO2 arterial: 48.5 mmHg — ABNORMAL HIGH (ref 35.0–45.0)
pH, Arterial: 7.432 (ref 7.350–7.450)
pO2, Arterial: 75.3 mmHg — ABNORMAL LOW (ref 80.0–100.0)

## 2012-01-27 LAB — CBC
HCT: 28.5 % — ABNORMAL LOW (ref 36.0–46.0)
Hemoglobin: 8.6 g/dL — ABNORMAL LOW (ref 12.0–15.0)
MCV: 82.4 fL (ref 78.0–100.0)
Platelets: 179 10*3/uL (ref 150–400)
RBC: 3.46 MIL/uL — ABNORMAL LOW (ref 3.87–5.11)
WBC: 7 10*3/uL (ref 4.0–10.5)

## 2012-01-27 LAB — GLUCOSE, CAPILLARY: Glucose-Capillary: 111 mg/dL — ABNORMAL HIGH (ref 70–99)

## 2012-01-27 LAB — BASIC METABOLIC PANEL
CO2: 33 mEq/L — ABNORMAL HIGH (ref 19–32)
Chloride: 104 mEq/L (ref 96–112)
Potassium: 4.6 mEq/L (ref 3.5–5.1)
Sodium: 143 mEq/L (ref 135–145)

## 2012-01-27 MED ORDER — HYDRALAZINE HCL 25 MG PO TABS
12.5000 mg | ORAL_TABLET | Freq: Three times a day (TID) | ORAL | Status: DC
  Administered 2012-01-27 – 2012-01-31 (×12): 12.5 mg via ORAL
  Filled 2012-01-27 (×15): qty 0.5

## 2012-01-27 MED ORDER — LABETALOL HCL 5 MG/ML IV SOLN
10.0000 mg | INTRAVENOUS | Status: DC | PRN
  Administered 2012-01-27: 10 mg via INTRAVENOUS
  Filled 2012-01-27 (×4): qty 4

## 2012-01-27 MED ORDER — HEPARIN SODIUM (PORCINE) 5000 UNIT/ML IJ SOLN
5000.0000 [IU] | Freq: Two times a day (BID) | INTRAMUSCULAR | Status: DC
  Administered 2012-01-27 – 2012-01-31 (×9): 5000 [IU] via SUBCUTANEOUS
  Filled 2012-01-27 (×10): qty 1

## 2012-01-27 NOTE — Progress Notes (Signed)
Palliative Medicine Team consult for goals of care, code status requested by Dr Marchelle Gearing; patient seen at bedside up in chair A&O to person, place, time able to tell this writer that her trach was out and she was ready to try to eat; she told Clinical research associate that she was doing rehab at [facility] before coming to the hospital;  patient appeared calm,  RR=20-22  On 4LNC sats 98-99% appears comfortable and denied any distress or discomfort during visit spoke with patient about PMT meeting and she was agreeable to this writer calling her daughter Sunday Corn ([phone]) to arrange meeting time- patient also reports that she has several cousins who also visit frequently and are involved with her care- await call back from daughter to schedule meeting   Valente David, RN 01/27/2012, 2:13 PM Palliative Medicine Team RN Liaison [phone]

## 2012-01-27 NOTE — Clinical Documentation Improvement (Signed)
RESPIRATORY FAILURE DOCUMENTATION CLARIFICATION QUERY   THIS DOCUMENT IS NOT A PERMANENT PART OF THE MEDICAL RECORD  TO RESPOND TO THE THIS QUERY, FOLLOW THE INSTRUCTIONS BELOW:  1. If needed, update documentation for the patient's encounter via the notes activity.  2. Access this query again and click edit on the In [employer].  3. After updating, or not, click F2 to complete all highlighted (required) fields concerning your review. Select "additional documentation in the medical record" OR "no additional documentation provided".  4. Click Sign note button.  5. The deficiency will fall out of your In Basket *Please let us know if you are not able to complete this workflow by phone or e-mail (listed below).  Please update your documentation within the medical record to reflect your response to this query.                                                                                    01/27/12  Dear Dr. [facility]   In a better effort to capture your patient's severity of illness, reflect appropriate length of stay and utilization of resources, a review of the patient medical record has revealed the following indicators.    Based on your clinical judgment, please clarify and document in a progress note and/or discharge summary the clinical condition associated with the following supporting information:  In responding to this query please exercise your independent judgment.  The fact that a query is asked, does not imply that any particular answer is desired or expected.  May also state that the condition is resolving, resolved,etc    Possible Clinical Conditions  Acute on Chronic Respiratory Failure  Other Condition  Cannot Clinically Determine    Risk Factors: Tracheostomy Dislodgement PMH: Severe COPD on home O2; Chronic Cor Pulmonale; Severe Systolic heart failure; OSA Recurrent Lt Lung Collapse  Signs&Symptoms: Placed on Bipap d/t ABG  results~Bipap removed 12-5 sometime after 0440  Diagnostics: Results for [name], [name] (MRN [id number]) as of 01/27/2012 14:51  Ref. Range 01/26/2012 11:00 01/26/2012 11:12 01/26/2012 14:59 01/27/2012 05:00 01/27/2012 10:18  Sample type No range found ARTERIAL DRAW  ARTERIAL  ARTERIAL DRAW  Delivery systems No range found NASAL CANNULA    NASAL CANNULA  O2 Content No range found 4.0    2.0  pH, Arterial Latest Range: 7.350-7.450  7.217 (L)  7.316 (L)  7.432  pCO2 arterial Latest Range: 35.0-45.0 mmHg 81.3 (HH)  68.4 (HH)  48.5 (H)  pO2, Arterial Latest Range: 80.0-100.0 mmHg 67.3 (L)  115.0 (H)  75.3 (L)  Bicarbonate Latest Range: 20.0-24.0 mEq/L 32.1 (H)  35.1 (H)  31.8 (H)  TCO2 Latest Range: 0-100 mmol/L 34.7  37  33.3    Radiology: PORTABLE CHEST - 1 VIEW  Comparison: 01/26/2012.  Findings: Opacification of the inferior left hemithorax. The  appearance suggests either collapse / consolidation of the left  lower lobe and / or left pleural effusion. Small focus of airspace  disease in the left midlung. Chronic elevation of the left  hemidiaphragm. The right lung is unchanged. Visualized  cardiopericardial silhouette unchanged. Monitoring leads are  projected over the chest.  IMPRESSION:  No  interval change. Collapse / consolidation of the left lung base  with small focus of airspace disease in the left midlung. Probable  moderate left pleural effusion.  Treatment: Continuous pulse oximetry and cardiac monitoring Nasal Cannula @ 2LPM Atrovent/Abuterol Nebs Every 2hrs as needed Scopolamine Transdermal every 72hrs  Incentive Spirometry Every hour while awake              You may use possible, probable, or suspect with inpatient documentation. possible, probable, suspected diagnoses MUST be documented at the time of discharge  Reviewed: Needed documentation in Progress Notes  Thank You,  Rossie Muskrat RN, BSN Clinical Documentation Improvement Specialist Telephone #  [phone]/Pager: [phone] [email or web address]  Health Information Management [place]

## 2012-01-27 NOTE — Evaluation (Signed)
Clinical/Bedside Swallow Evaluation Patient Details  Name: [name] MRN: [id number] Date of Birth: August 25, 1940  Today's Date: 01/27/2012 Time: 1191-4782 SLP Time Calculation (min): 42 min  Past Medical History:  Past Medical History  Diagnosis Date  . COPD (chronic obstructive pulmonary disease)     on home o2  . Chronic renal insufficiency   . CVA (cerebral infarction)     hx  . Hypertensive heart disease with congestive heart failure   . Chronic cor pulmonale   . Cardiomyopathy of undetermined type 09/07/2011  . Chronic kidney disease (CKD), stage IV (severe)   . Hyperlipdemia   . Obesity (BMI 30-39.9)   . Sleep apnea   . History of gout 09/07/2011  . Heart failure 7/13  . Shortness of breath   . Anemia   . Vascular disease   . Asthma   . Anginal pain   . Hypertension   . Type II or unspecified type diabetes mellitus without mention of complication, not stated as uncontrolled   . Stroke   . GERD (gastroesophageal reflux disease)   . Arthritis    Past Surgical History:  Past Surgical History  Procedure Date  . Bilateral oophorectomy   . Cardiac catheterization     right heart cath  . Laparoscopic gastrostomy 12/21/2011    Procedure: LAPAROSCOPIC GASTROSTOMY;  Surgeon: Axel Filler, MD;  Location: [facility] OR;  Service: General;  Laterality: N/A;  laparoscopic gastrostomy possible open feeding tube  . Abdominal hysterectomy   . Tracheostomy 11/2011  . Cataracts    HPI:  71 yo with COPD / OSA / was admitted 3 days after a discharge from SNF on  11/14/11 with hypercarbic respiratory failure  And had issues of Recurrent intubatin due to recurrent left lung collapse / post intubation vocal cord edema . [name] percutaneous tracheostomy placed 11/23/11  by Dr. Tyson Alias and discharged 11/25/11. After this she had an admission ni mid nov 2013 for abd wall cellultitis. However, this time she was  brought to [facility] on 12/3 after tracheostomy was dislodged in the nursing  home.  In ED: tracheostomy tube appears out of the tract with stoma completely closed.  Patient denies any shortness of breath. PCCM addmitted 01/25/2012   Assessment / Plan / Recommendation Clinical Impression  Patient appears to have intact swallow function at this time.  No overt s/s of aspiration noted with any consistency.  Patient is endentulous and reports having lost her dentures PTA.  She reports she can chew most anything, but the SNF had her on a chopped/minced diet.  Patient was able to chew crackers without difficulty.    Aspiration Risk  Mild    Diet Recommendation Dysphagia 3 (Mechanical Soft);Thin liquid (with chopped meats;)   Liquid Administration via: Straw Medication Administration: Whole meds with liquid Supervision: Patient able to self feed;Intermittent supervision to cue for compensatory strategies Compensations: Slow rate;Small sips/bites Postural Changes and/or Swallow Maneuvers: Seated upright 90 degrees    Other  Recommendations Oral Care Recommendations: Oral care QID;Staff/trained caregiver to provide oral care Other Recommendations: Clarify dietary restrictions   Follow Up Recommendations  None    Frequency and Duration min 2x/week  2 weeks   Pertinent Vitals/Pain n/a    SLP Swallow Goals Patient will consume recommended diet without observed clinical signs of aspiration with: Supervision/safety;Set-up   Swallow Study Prior Functional Status       General HPI: 71 yo with COPD / OSA / was admitted 3 days after a discharge from SNF on  11/14/11 with hypercarbic respiratory failure  And had issues of Recurrent intubatin due to recurrent left lung collapse / post intubation vocal cord edema . [name] percutaneous tracheostomy placed 11/23/11  by Dr. Tyson Alias and discharged 11/25/11. After this she had an admission ni mid nov 2013 for abd wall cellultitis. However, this time she was  brought to [facility] on 12/3 after tracheostomy was dislodged in the nursing  home.  In ED: tracheostomy tube appears out of the tract with stoma completely closed.  Patient denies any shortness of breath. PCCM addmitted 01/25/2012 Type of Study: Bedside swallow evaluation Diet Prior to this Study: Dysphagia 2 (chopped) (Due to being edentulous) Temperature Spikes Noted: No Respiratory Status: Supplemental O2 delivered via (comment) History of Recent Intubation: No Behavior/Cognition: Alert;Cooperative;Pleasant mood Oral Cavity - Dentition: Edentulous (Lost dentures) Self-Feeding Abilities: Able to feed self;Needs set up Patient Positioning: Upright in chair Baseline Vocal Quality: Clear Volitional Cough: Strong Volitional Swallow: Able to elicit    Oral/Motor/Sensory Function Overall Oral Motor/Sensory Function: Appears within functional limits for tasks assessed   Ice Chips Ice chips: Within functional limits Presentation: Spoon   Thin Liquid Thin Liquid: Not tested Presentation: Spoon;Cup;Straw    Nectar Thick Nectar Thick Liquid: Not tested   Honey Thick Honey Thick Liquid: Not tested   Puree Puree: Within functional limits Presentation: Spoon   Solid   GO    Solid: Within functional limits Presentation: Self [name], [name] T 01/27/2012,2:32 PM

## 2012-01-27 NOTE — Care Management Note (Signed)
    Page 1 of 1   01/27/2012     2:38:05 PM   CARE MANAGEMENT NOTE 01/27/2012  Patient:  MARGAUX, ENGEN   Account Number:  [id number]  Date Initiated:  01/27/2012  Documentation initiated by:  [facility]  Subjective/Objective Assessment:   REsp failure after dislodged trach.  Placed on bipap and tx to ICU.     Action/Plan:   Anticipated DC Date:  01/31/2012   Anticipated DC Plan:  SKILLED NURSING FACILITY  In-house referral  Clinical Social Worker      DC Planning Services  CM consult      Choice offered to / List presented to:             Status of service:  In process, will continue to follow Medicare Important Message given?   (If response is "NO", the following Medicare IM given date fields will be blank) Date Medicare IM given:   Date Additional Medicare IM given:    Discharge Disposition:    Per UR Regulation:  Reviewed for med. necessity/level of care/duration of stay  If discussed at Long Length of Stay Meetings, dates discussed:    Comments:  Contact:  daughter Sunday Corn ([phone])  01-27-12 2:35pm Avie Arenas, RNBSN [phone] Tx to ICU on 12-4 for resp failure requiring bipap.  Doing very well today - trach cancelled.  Plan for tx out of ICU. SW following as from SNF and plan for return.  palliative care called in for goals of care discussion.

## 2012-01-27 NOTE — Progress Notes (Signed)
Pt admitted to room 4740 per w/c from 2100. Alert able to use nurse call system. Made aware of fall policy- placed on tele monitor. 02 on @ 2 L n/c. Called for dinner tray. Marisa Cyphers RN

## 2012-01-27 NOTE — Progress Notes (Signed)
Clinical Social Worker attempted to phone pt's family, message left on voicemail.  CSW to continue to follow and assist as needed.  Of note, pt is from [facility].   Angelia Mould, MSW, [place] [phone]

## 2012-01-27 NOTE — H&P (Signed)
PULMONARY  / CRITICAL CARE MEDICINE  Name: [name] MRN: [id number] DOB: [birth year]    LOS: 2  REFERRING PROVIDER:  EDP  CHIEF COMPLAINT:  Dislodged tracheostomy tube.  BRIEF PATIENT DESCRIPTION: 71 yo with COPD / OSA / was admitted 3 days after a discharge from SNF on  11/14/11 with hypercarbic respiratory failure  And had issues of  Recurrent intubatin due to recurrent left lung collapse / post intubation vocal cord edema . [name] percutaneous tracheostomy placed 11/23/11  by Dr. Tyson Alias and discharged 11/25/11. After this she had an admission ni mid nov 2013 for abd wall cellultitis. However, this time she was  brought to [facility] on 12/3 after tracheostomy was dislodged in the nursing home.  In ED: tracheostomy tube appears out of the tract with stoma completely closed.  Patient denies any shortness of breath. PCCM addmitted 01/25/2012 In view of doing redo trach.  Other significant past medical h is Nov admission for abd wall cellulitis,. ? Systolic CHF DM, HTN    LEVEL OF CARE:  SDU observation PRIMARY SERVICE:  PCCM CONSULTANTS:  NA CODE STATUS:  Full DIET:  NPO DVT Px:  SCDs GI Px:  Not indicated   LINES / TUBES: NA  CULTURES: NA  ANTIBIOTICS: NA  SIGNIFICANT EVENTS:  12/3  Brought to [facility] with dislodged tracheostomy tube 12/4 - increased rr, no secretions   INTERVAL HISTORY:  12/5./13: Clinically well. Refusing tracheostomy BP on higher side  VITAL SIGNS: Temp:  [97.9 F (36.6 C)-99 F (37.2 C)] 99 F (37.2 C) (12/05 1134) Pulse Rate:  [50-81] 64  (12/05 1100) Resp:  [15-26] 26  (12/05 1100) BP: (142-183)/(69-108) 172/70 mmHg (12/05 1100) SpO2:  [98 %-100 %] 100 % (12/05 1100) FiO2 (%):  [30 %-40 %] 30 % (12/05 0700)  PHYSICAL EXAMINATION: General:  Comfortable,sitting in chair and waching TV Neuro:  Awake, alert, oriented HEENT:  PERRL Neck:  Stoma closed, trachea midline, small amount granualtion Cardiovascular:  RRR Lungs:  Bilateral  diminished breath sounds, no stridor, speaking well Abdomen:  Soft Musculoskeletal:  Moves all extremities Skin:  No edema  LABS:  Lab 01/27/12 0500 01/26/12 1112  NA 143 140  K 4.6 4.6  CL 104 103  CO2 33* 32  BUN 27* 26*  CREATININE 1.68* 1.62*  GLUCOSE 95 96    Lab 01/27/12 0500 01/26/12 1112  HGB 8.6* 9.9*  HCT 28.5* 33.1*  WBC 7.0 7.9  PLT 179 170   Dg Chest Port 1 View  01/27/2012  *RADIOLOGY REPORT*  Clinical Data: Short of breath.  Atelectasis.  PORTABLE CHEST - 1 VIEW  Comparison: 01/26/2012.  Findings:  Opacification of the inferior left hemithorax.  The appearance suggests either collapse / consolidation of the left lower lobe and / or left pleural effusion.  Small focus of airspace disease in the left midlung.  Chronic elevation of the left hemidiaphragm.  The right lung is unchanged.  Visualized cardiopericardial silhouette unchanged. Monitoring leads are projected over the chest.  IMPRESSION: No interval change. Collapse / consolidation of the left lung base with small focus of airspace disease in the left midlung.  Probable moderate left pleural effusion.   Original Report Authenticated By: Andreas Newport, M.D.    Dg Chest Port 1 View  01/26/2012  *RADIOLOGY REPORT*  Clinical Data: Atelectasis. Increased difficulty breathing.  PORTABLE CHEST - 1 VIEW  Comparison: CT scan dated 01/04/2012 and chest x-ray dated 01/03/2012  Findings: There is increased atelectasis and slight consolidation in  the left lung base since the prior exams. New small left effusion.  There is chronic cardiomegaly.  Pulmonary vascularity is normal.  Slight atelectasis at the right lung base.  Extensive coronary artery calcification is noted.  Tracheostomy tube has been removed since the prior exams.  IMPRESSION: Increased atelectasis at the left lung base with slight consolidation.  Small left effusion.   Original Report Authenticated By: Francene Boyers, M.D.     IMAGING: Dg Chest Port 1  View  01/27/2012  *RADIOLOGY REPORT*  Clinical Data: Short of breath.  Atelectasis.  PORTABLE CHEST - 1 VIEW  Comparison: 01/26/2012.  Findings:  Opacification of the inferior left hemithorax.  The appearance suggests either collapse / consolidation of the left lower lobe and / or left pleural effusion.  Small focus of airspace disease in the left midlung.  Chronic elevation of the left hemidiaphragm.  The right lung is unchanged.  Visualized cardiopericardial silhouette unchanged. Monitoring leads are projected over the chest.  IMPRESSION: No interval change. Collapse / consolidation of the left lung base with small focus of airspace disease in the left midlung.  Probable moderate left pleural effusion.   Original Report Authenticated By: Andreas Newport, M.D.    Dg Chest Port 1 View  01/26/2012  *RADIOLOGY REPORT*  Clinical Data: Atelectasis. Increased difficulty breathing.  PORTABLE CHEST - 1 VIEW  Comparison: CT scan dated 01/04/2012 and chest x-ray dated 01/03/2012  Findings: There is increased atelectasis and slight consolidation in the left lung base since the prior exams. New small left effusion.  There is chronic cardiomegaly.  Pulmonary vascularity is normal.  Slight atelectasis at the right lung base.  Extensive coronary artery calcification is noted.  Tracheostomy tube has been removed since the prior exams.  IMPRESSION: Increased atelectasis at the left lung base with slight consolidation.  Small left effusion.   Original Report Authenticated By: Francene Boyers, M.D.     ASSESSMENT / PLAN:  Active Problems:  Obesity (BMI 30-39.9)  Sleep apnea  CORONARY ARTERY DISEASE  C O P D  Cardiomyopathy of undetermined type  Tracheostomy complication   ASSESSMENT AND PLAN  PULMONARY  Lab 01/27/12 1018 01/26/12 1459 01/26/12 1100  PHART 7.432 7.316* 7.217*  PCO2ART 48.5* 68.4* 81.3*  PO2ART 75.3* 115.0* 67.3*  HCO3 31.8* 35.1* 32.1*  O2SAT 98.3 98.0 94.3   Ventilator Settings: Vent Mode:   [-]  FiO2 (%):  [30 %-40 %] 30 %   A:  Severe COPD and high risk COPD. Chronic Resp failure  - s/p dislodgement of trach  Currently L lung collapse but compensainting well and refusing red0 trach P:   Monitor out side ICU in tele Aggressive pulm toilet Address goals of care given high riskk copd status and high risk for decompensation in view of poor copd and left lung collapse Might need CT chest for collapse depending on CT results  CARDIOVASCULAR No results found for this basename: TROPONINI:5,LATICACIDVEN:5, O2SATVEN:5,PROBNP:5 in the last 168 hours   A: SEvere systolic CHF as of July 2103 - ef 30% P:  Recheck bNP, troponin Recheck echo   RENAL  Lab 01/27/12 0500 01/26/12 1112  NA 143 140  K 4.6 4.6  CL 104 103  CO2 33* 32  BUN 27* 26*  CREATININE 1.68* 1.62*  CALCIUM 9.2 9.3  MG -- --  PHOS -- --   Intake/Output      12/04 0701 - 12/05 0700 12/05 0701 - 12/06 0700   P.O. 60    Total Intake(mL/kg)  60 (0.7)    Urine (mL/kg/hr) 1250 (0.6) 400 (1)   Total Output 1250 400   Net -1190 -400        Urine Occurrence 1 x     Foley:   A:  CKD (creat in sept and nov was in 2's). Currently better than recent baseline P:   monitor  GASTROINTESTINAL No results found for this basename: AST:5,ALT:5,ALKPHOS:5,BILITOT:5,PROT:5,ALBUMIN:5 in the last 168 hours  A:  She is NPO after trach dislodged P:   Speech eval  HEMATOLOGIC  Lab 01/27/12 0500 01/26/12 1112  HGB 8.6* 9.9*  HCT 28.5* 33.1*  PLT 179 170  INR -- --  APTT -- --   A:  Anemia of chronic disease P:  Monitor PRBC for hgb < 7gm%   INFECTIOUS  Lab 01/27/12 0500 01/26/12 1112  WBC 7.0 7.9  PROCALCITON -- --    A:  No evidence of infection P:   monitor  ENDOCRINE  Lab 01/26/12 2006 01/26/12 1412  GLUCAP 81 80   A:  Nil acute P:   monitor  NEUROLOGIC  A:  Intact P:   monitor  BEST PRACTICE / DISPOSITION Level of Care:  Move to tele Primary Service:  pccm Consultants:   Palliative care called Code Status:  full Diet:  Npo awaiting speech DVT Px:  Heparin sq GI Px:  N/A Skin Integrity:  intact Social / Family:  ?    Dr. Kalman Shan, M.D., [facility].C.P Pulmonary and Critical Care Medicine Staff Physician  System  Pulmonary and Critical Care Pager: [phone], If no answer or between  15:00h - 7:00h: call [phone]  01/27/2012 12:09 PM

## 2012-01-28 DIAGNOSIS — I517 Cardiomegaly: Secondary | ICD-10-CM

## 2012-01-28 LAB — CBC
HCT: 27.2 % — ABNORMAL LOW (ref 36.0–46.0)
Platelets: 202 10*3/uL (ref 150–400)
RBC: 3.37 MIL/uL — ABNORMAL LOW (ref 3.87–5.11)
RDW: 17.4 % — ABNORMAL HIGH (ref 11.5–15.5)
WBC: 7.5 10*3/uL (ref 4.0–10.5)

## 2012-01-28 LAB — MAGNESIUM: Magnesium: 1.3 mg/dL — ABNORMAL LOW (ref 1.5–2.5)

## 2012-01-28 LAB — BASIC METABOLIC PANEL
BUN: 28 mg/dL — ABNORMAL HIGH (ref 6–23)
Chloride: 101 mEq/L (ref 96–112)
GFR calc Af Amer: 32 mL/min — ABNORMAL LOW (ref >90)
Potassium: 3.9 mEq/L (ref 3.5–5.1)

## 2012-01-28 LAB — GLUCOSE, CAPILLARY
Glucose-Capillary: 118 mg/dL — ABNORMAL HIGH (ref 70–99)
Glucose-Capillary: 98 mg/dL (ref 70–99)
Glucose-Capillary: 102 mg/dL — ABNORMAL HIGH (ref 70–99)

## 2012-01-28 LAB — TROPONIN I: Troponin I: <0.3 ng/mL (ref <0.30)

## 2012-01-28 LAB — PRO B NATRIURETIC PEPTIDE: Pro B Natriuretic peptide (BNP): 8936 pg/mL — ABNORMAL HIGH (ref 0–125)

## 2012-01-28 MED ORDER — IPRATROPIUM BROMIDE 0.02 % IN SOLN
0.5000 mg | Freq: Three times a day (TID) | RESPIRATORY_TRACT | Status: DC
  Administered 2012-01-28 – 2012-01-31 (×9): 0.5 mg via RESPIRATORY_TRACT
  Filled 2012-01-28 (×7): qty 2.5

## 2012-01-28 MED ORDER — ALBUTEROL SULFATE (5 MG/ML) 0.5% IN NEBU
2.5000 mg | INHALATION_SOLUTION | Freq: Three times a day (TID) | RESPIRATORY_TRACT | Status: DC
  Administered 2012-01-28 – 2012-01-31 (×9): 2.5 mg via RESPIRATORY_TRACT
  Filled 2012-01-28 (×9): qty 0.5

## 2012-01-28 NOTE — Progress Notes (Addendum)
PMT consult scheduled for GOC -scheduled with dtr [name] [phone] For Sat 12/7 @ 12 noon with Dr Waynetta Sandy Campbell Lerner RN  Palliative Medicine Liaison [phone]

## 2012-01-28 NOTE — Progress Notes (Signed)
  Echocardiogram 2D Echocardiogram has been performed.  Jovi Zavadil 01/28/2012, 11:14 AM

## 2012-01-28 NOTE — Progress Notes (Signed)
PULMONARY  / CRITICAL CARE MEDICINE  Name: [name] MRN: [id number] DOB: 1940-10-05    LOS: 3  REFERRING PROVIDER:  Zadie Rhine  CHIEF COMPLAINT:  Dislodged tracheostomy  BRIEF PATIENT DESCRIPTION:  71 yo female former smoker admitted on 01/25/2012 from NH after trach dislodgement.  Tracheostomy stoma trach noted to be closed in ED.  Had tracheostomy placed by [facility] 11/23/11.  Significant PMHx COPD, OSA, recurrent Lt lung collapse, CKD, CVA, HTN, Diastolic CHF, Hyperlipidemia, Gout, DM, GERD   LINES / TUBES: PIV  CULTURES: None  ANTIBIOTICS: None  EVENTS:  12/03 From NH to ED 12/05 Pt refused tracheostomy re-do 12/05 Speech therapy eval>>D3 diet, thin liquids 12/05 PEG tube dislodged>>not replaced.  LEVEL OF CARE:  Telemetry PRIMARY SERVICE:  PCCM CONSULTANTS:  Palliative care CODE STATUS:  Full DIET:  D3 DVT Px:  SQ heparin GI Px:  Protonix  INTERVAL HISTORY:  Intermittent cough, with clear sputum.  Denies chest pain, abdominal pain.  VITAL SIGNS: Temp:  [98.2 F (36.8 C)-98.9 F (37.2 C)] 98.6 F (37 C) (12/06 0501) Pulse Rate:  [57-73] 71  (12/06 0917) Resp:  [16-26] 16  (12/06 0917) BP: (103-187)/(66-86) 146/78 mmHg (12/06 0917) SpO2:  [99 %-100 %] 100 % (12/06 0917) Weight:  [174 lb 8 oz (79.153 kg)-176 lb (79.833 kg)] 176 lb (79.833 kg) (12/06 0501)  2L nasal cannula  PHYSICAL EXAMINATION: General:  No distress Neuro:  Alert, follows commands HEENT:  No sinus tenderness Cardiovascular:  s1s2 regular Lungs:  Decreased breath sounds Lt base, no wheeze Abdomen:  Soft, non tender, PEG site ostomy clean Musculoskeletal:  No edema Skin:  No rashes   Lab 01/28/12 0540 01/27/12 0500 01/26/12 1112  NA 141 143 140  K 3.9 4.6 4.6  CL 101 104 103  CO2 33* 33* 32  BUN 28* 27* 26*  CREATININE 1.78* 1.68* 1.62*  GLUCOSE 112* 95 96    Lab 01/28/12 0540 01/27/12 0500 01/26/12 1112  HGB 8.4* 8.6* 9.9*  HCT 27.2* 28.5* 33.1*  WBC 7.5 7.0  7.9  PLT 202 179 170   Dg Chest Port 1 View  01/27/2012  *RADIOLOGY REPORT*  Clinical Data: Short of breath.  Atelectasis.  PORTABLE CHEST - 1 VIEW  Comparison: 01/26/2012.  Findings:  Opacification of the inferior left hemithorax.  The appearance suggests either collapse / consolidation of the left lower lobe and / or left pleural effusion.  Small focus of airspace disease in the left midlung.  Chronic elevation of the left hemidiaphragm.  The right lung is unchanged.  Visualized cardiopericardial silhouette unchanged. Monitoring leads are projected over the chest.  IMPRESSION: No interval change. Collapse / consolidation of the left lung base with small focus of airspace disease in the left midlung.  Probable moderate left pleural effusion.   Original Report Authenticated By: Andreas Newport, M.D.    CBG (last 3)   Basename 01/28/12 0727 01/28/12 0414 01/28/12 0013  GLUCAP 92 118* 118*     ASSESSMENT / PLAN:  A: Hx of COPD. Recurrent Lt lung ATX. Acute on chronic respiratory failure. Hx of OSA. Pt declined tracheostomy replacement. P: F/u CXR 12/07 Change BD's to scheduled and prn Bronchial hygiene Continue scopolamine to minimize secretions  A: hx of HTN with chronic diastolic dysfx. Hx of CVA, hyperlipidemia. P: Continue ASA, coreg, hydralazine, torsemide, simvastatin F/u Echo from 12/05>>  A: Dysphagia. Hx of GERD. P: D3 diet Continue protonix  A: DM type II. Not needing insulin. P: Monitor CBG's  A: CKD. P: F/u renal fx, monitor urine outpt  A: Anemia of chronic disease. P: F/u CBC  A: Hx of Gout. P: Continue allopurinol  A: Goals of care. P: Palliative care meeting set with family for Saturday 12/07 at high noon.  Disposition: Pending outcome of family meeting 12/07 will likely be ready for d/c home soon   Coralyn Helling, MD [facility] Pulmonary/Critical Care 01/28/2012, 11:43 AM Pager:  [phone] After 3pm call: [phone]

## 2012-01-28 NOTE — Progress Notes (Signed)
[facility]c [facility] to [facility] when medically stable. Patient wants to return there and facility will accept back. Fl2 on chart for MD's signature.  Lorri Frederick. [place]  [phone]

## 2012-01-29 ENCOUNTER — Inpatient Hospital Stay (HOSPITAL_COMMUNITY): Payer: Medicare Other

## 2012-01-29 LAB — GLUCOSE, CAPILLARY
Glucose-Capillary: 130 mg/dL — ABNORMAL HIGH (ref 70–99)
Glucose-Capillary: 131 mg/dL — ABNORMAL HIGH (ref 70–99)
Glucose-Capillary: 97 mg/dL (ref 70–99)
Glucose-Capillary: 162 mg/dL — ABNORMAL HIGH (ref 70–99)

## 2012-01-29 LAB — BASIC METABOLIC PANEL
CO2: 34 mEq/L — ABNORMAL HIGH (ref 19–32)
Calcium: 8.7 mg/dL (ref 8.4–10.5)
GFR calc Af Amer: 31 mL/min — ABNORMAL LOW (ref >90)
Sodium: 139 mEq/L (ref 135–145)

## 2012-01-29 LAB — CBC
MCV: 81.5 fL (ref 78.0–100.0)
Platelets: 205 10*3/uL (ref 150–400)
RBC: 3.56 MIL/uL — ABNORMAL LOW (ref 3.87–5.11)
WBC: 8 10*3/uL (ref 4.0–10.5)

## 2012-01-29 NOTE — Progress Notes (Signed)
PULMONARY  / CRITICAL CARE MEDICINE  Name: [name] MRN: [id number] DOB: Jun 30, 1940    LOS: 4  REFERRING PROVIDER:  Zadie Rhine  CHIEF COMPLAINT:  Dislodged tracheostomy  BRIEF PATIENT DESCRIPTION:  71 yo female former smoker admitted on 01/25/2012 from NH after trach dislodgement.  Tracheostomy stoma trach noted to be closed in ED.  Had tracheostomy placed by [facility] 11/23/11.  Significant PMHx COPD, OSA, recurrent Lt lung collapse, CKD, CVA, HTN, Diastolic CHF, Hyperlipidemia, Gout, DM, GERD   LINES / TUBES: PIV  CULTURES: None  ANTIBIOTICS: None  EVENTS:  12/03 From NH to ED 12/05 Pt refused tracheostomy re-do 12/05 Speech therapy eval>>D3 diet, thin liquids 12/05 PEG tube dislodged>>not replaced.  LEVEL OF CARE:  Telemetry PRIMARY SERVICE:  PCCM CONSULTANTS:  Palliative care CODE STATUS:  Full DIET:  D3 DVT Px:  SQ heparin GI Px:  Protonix  INTERVAL HISTORY:  No complaints  VITAL SIGNS: Temp:  [97.7 F (36.5 C)-98.5 F (36.9 C)] 97.7 F (36.5 C) (12/07 1341) Pulse Rate:  [65-72] 68  (12/07 1341) Resp:  [19-20] 19  (12/07 1341) BP: (116-178)/(59-90) 116/66 mmHg (12/07 1341) SpO2:  [94 %-100 %] 94 % (12/07 1341) Weight:  [176 lb 6.4 oz (80.015 kg)] 176 lb 6.4 oz (80.015 kg) (12/07 0430)  FIO2 2L nasal cannula  PHYSICAL EXAMINATION: General:  No distress Neuro:  Alert, follows commands HEENT:  No sinus tenderness Cardiovascular:  s1s2 regular Lungs:  Decreased breath sounds Lt base, no wheeze Abdomen:  Soft, non tender, PEG site ostomy clean Musculoskeletal:  No edema Skin:  No rashes   Lab 01/29/12 0635 01/28/12 0540 01/27/12 0500  NA 139 141 143  K 4.0 3.9 4.6  CL 99 101 104  CO2 34* 33* 33*  BUN 27* 28* 27*  CREATININE 1.82* 1.78* 1.68*  GLUCOSE 96 112* 95    Lab 01/29/12 0635 01/28/12 0540 01/27/12 0500  HGB 8.8* 8.4* 8.6*  HCT 29.0* 27.2* 28.5*  WBC 8.0 7.5 7.0  PLT 205 202 179   Dg Chest Port 1 View  01/29/2012   *RADIOLOGY REPORT*  Clinical Data: Shortness of breath and atelectasis.  PORTABLE CHEST - 1 VIEW  Comparison: 01/27/2012  Findings: There are persistent densities and volume loss at the left lung base.  Findings are suggestive for volume loss with pleural fluid.  Slightly improved aeration in the left mid lung. Right lung remains clear.  The heart size is grossly stable.  IMPRESSION: Persistent densities at the left lung base.  Findings most likely represent a combination of volume loss and pleural fluid.  Airspace disease cannot be excluded in this area.  Slightly improved aeration in the left mid lung.   Original Report Authenticated By: Richarda Overlie, M.D.    CBG (last 3)   Basename 01/29/12 1258 01/29/12 0423 01/29/12 0031  GLUCAP 144* 97 131*     ASSESSMENT / PLAN:  A: Hx of COPD. Recurrent Lt lung ATX. Acute on chronic respiratory failure. Hx of OSA. Pt declined tracheostomy replacement. P: Change BD's to scheduled and prn Bronchial hygiene Continue scopolamine to minimize secretions  A: hx of HTN with chronic diastolic dysfx. Hx of CVA, hyperlipidemia. -Echo from 12/06 - Left ventricle: The cavity size was normal. Wall thickness was increased in a pattern of severe LVH. Systolic function was mildly reduced. The estimated ejection fraction was in the range of 45% to 50%. Diffuse hypokinesis. Doppler parameters are consistent with abnormal left ventricular relaxation (grade 1 diastolic dysfunction). -  Aortic valve: Trileaflet; moderately calcified leaflets. There was very mild stenosis. Mean gradient: 10mm Hg (S). Peak gradient: 20mm Hg (S). - Mitral valve: Mildly calcified annulus. Trivial regurgitation. - Left atrium: The atrium was moderately dilated. - Right ventricle: The cavity size was normal. Systolic function was normal. - Right atrium: The atrium was moderately dilated. - Pulmonary arteries: No complete TR doppler jet so unable to estimate PA systolic  pressure P: Continue ASA, coreg, hydralazine, torsemide, simvastatin    A: Dysphagia. Hx of GERD. P: D3 diet Continue protonix  A: DM type II. Not needing insulin. P: Monitor CBG's  A: CKD. P: F/u renal fx, monitor urine outpt  A: Anemia of chronic disease. P: F/u CBC  A: Hx of Gout. P: Continue allopurinol  A: Goals of care. P: Palliative care meeting set with family for 12/07    Disposition: Pending outcome of family meeting 12/07 will likely be ready for d/c home soon   Sandrea Hughs, MD Pulmonary and Critical Care Medicine [facility] Cell [phone]   After 3pm call: [phone]

## 2012-01-29 NOTE — Progress Notes (Signed)
Notified by monitor tech that patient had a 12 beat run of v-tach. Patient asymptomatic. She was sleeping in bed at the time. VS were taken. MD on call for [facility] was called and notified. BP was elevated was told not to give  prn medication . No new orders written. Will write a physician sticky note on chart to notify MD to address parameters.

## 2012-01-29 NOTE — Consult Note (Signed)
Palliative Medicine Team at [facility]  Date: 01/29/2012   Patient Name: [name]  DOB: 05-29-1940  MRN: [id number]  Age / Sex: 71 y.o., female   PCP: Felicia Peng, MD Referring Physician: Lonia [name]*  HPI/Reason for Consultation: 71 yo with chronic respiratory failure with prior tracheostomy, CHF, CKD cirrhosis and CAD who was admitted after her trach was dislodged at her SNF-when she arrived in ED stoma was closed, a decision has been made to not re-insert tracheostomy. PMT asked to assist with goals of care because of her multitude of health problems and need for advance care planning.  Participants in Discussion: Patient and her daughter [name].  Goals/Summary of Discussion: Patient and daughter both open to a discussion today about advance care planning and to establish goals of care-it has clearly been a difficult few months for the patient, but she has been satisfied with her care at [name]. Her daughter [name] is very supportive at bedside. While there is a basic understanding of her health condition there is very low health literacy and hesitancy to explore the possible trajectories of her disease in terms of theoretical scenarios- it is much easier for them to understand concrete events. I am not sure if the patient fully was able to visualize or understand what I was asking her about her code status and also had a very hard time opening up to me about her feelings, fears, and view on her current quality of life.  Despite a very strong physician recommendation for DNR, they have decided to leave her status as FULL CODE. For them, going on a vent and being in the ICU is just how people die and they have had other family die that way including the patients husband and [name]. The patient would like a time limited trial of intubation if needed, would prefer to not have another trach placed (enjoys having her voice back), and would want CPR, Defib an all  ACLS measures to be employed despite her co-morbidities and low chance of survival. I am not sure that is medically reasonable, but without full informed consent and expressed wishes for those kinds of interventions she should remain a full code. Daughter [name] will obtain HCPOA and says she will know when to say "enough is enough".  Summary:  1. Full Code 2. Aggressive medical interventions 3. Daughter [name] needs assistance with HCPOA paperwork.  Social History:   reports that she quit smoking about 5 months ago. Her smoking use included Cigarettes. She has a 55 pack-year smoking history. She has never used smokeless tobacco. She reports that she does not drink alcohol or use illicit drugs.   Family History: Family History  Problem Relation Age of Onset  . Heart attack [name]   . Stroke Mother     CVA  . Coronary artery disease Daughter     Active Medications:  Outpatient medications: Prescriptions prior to admission  Medication Sig Dispense Refill  . allopurinol (ZYLOPRIM) 100 MG tablet Take 100 mg by mouth daily.       Marland Kitchen aspirin 81 MG chewable tablet Chew 81 mg by mouth daily.       . carvedilol (COREG) 12.5 MG tablet Take 12.5 mg by mouth 2 (two) times daily with a meal.      . cholecalciferol (VITAMIN D) 1000 UNITS tablet Take 2,000 Units by mouth daily.       Marland Kitchen escitalopram (LEXAPRO) 5 MG tablet Take 7.5 mg by mouth daily.      Marland Kitchen  feeding supplement (ENSURE COMPLETE) LIQD Take 237 mLs by mouth 3 (three) times daily between meals.      . feeding supplement (PRO-STAT SUGAR FREE 64) LIQD Take 30 mLs by mouth 3 (three) times daily with meals.      Marland Kitchen omeprazole (PRILOSEC) 20 MG capsule Take 20 mg by mouth daily.       . pravastatin (PRAVACHOL) 40 MG tablet Take 40 mg by mouth every evening.       Marland Kitchen scopolamine (TRANSDERM-SCOP) 1.5 MG Place 1 patch onto the skin every 3 (three) days.      Marland Kitchen torsemide (DEMADEX) 20 MG tablet Take 20 mg by mouth daily.         Current  medications: Infusions:    Scheduled Medications:    . albuterol  2.5 mg Nebulization TID  . allopurinol  100 mg Oral Daily  . antiseptic oral rinse  15 mL Mouth Rinse QID  . aspirin  81 mg Oral Daily  . carvedilol  12.5 mg Oral BID WC  . chlorhexidine  15 mL Mouth/Throat BID  . cholecalciferol  2,000 Units Oral Daily  . escitalopram  10 mg Oral Daily  . heparin subcutaneous  5,000 Units Subcutaneous Q12H  . hydrALAZINE  12.5 mg Oral Q8H  . ipratropium  0.5 mg Nebulization TID  . pantoprazole  40 mg Oral Daily  . scopolamine  1 patch Transdermal Q72H  . simvastatin  20 mg Oral q1800  . torsemide  20 mg Oral Daily    PRN Medications: albuterol, ipratropium, labetalol   Vital Signs: BP 160/71  Pulse 66  Temp 97.7 F (36.5 C) (Oral)  Resp 19  Ht 5\' 4"  (1.626 m)  Wt 80.015 kg (176 lb 6.4 oz)  BMI 30.28 kg/m2  SpO2 94%   Physical Exam:  General appearance: NAD, conversant  Eyes: anicteric sclerae, moist conjunctivae; no lid-lag; PERRLA HENT: Atraumatic; oropharynx clear with moist mucous membranes and no mucosal ulcerations; normal hard and soft palate Neck: Trachea midline; FROM, supple, no thyromegaly or lymphadenopathy Lungs: CTA, with normal respiratory effort and no intercostal retractions CV: RRR, no MRGs  Abdomen: Soft, non-tender; no masses or HSM Extremities: No peripheral edema or extremity lymphadenopathy Skin: Normal temperature, turgor and texture; no rash, ulcers or subcutaneous nodules Psych: Appropriate affect, alert and oriented to person, place and time  Labs:  Basic or Comprehensive Metabolic Panel:    Component Value Date/Time   NA 139 01/29/2012 0635   K 4.0 01/29/2012 0635   CL 99 01/29/2012 0635   CO2 34* 01/29/2012 0635   BUN 27* 01/29/2012 0635   CREATININE 1.82* 01/29/2012 0635   GLUCOSE 96 01/29/2012 0635   CALCIUM 8.7 01/29/2012 0635   AST 22 01/03/2012 2315   ALT 12 01/03/2012 2315   ALKPHOS 86 01/03/2012 2315   BILITOT 0.2* 01/03/2012  2315   PROT 7.1 01/03/2012 2315   ALBUMIN 2.6* 01/03/2012 2315     CBC:    Component Value Date/Time   WBC 8.0 01/29/2012 0635   HGB 8.8* 01/29/2012 0635   HCT 29.0* 01/29/2012 0635   PLT 205 01/29/2012 0635   MCV 81.5 01/29/2012 0635   NEUTROABS 5.7 01/26/2012 1112   LYMPHSABS 1.4 01/26/2012 1112   MONOABS 0.6 01/26/2012 1112   EOSABS 0.2 01/26/2012 1112   BASOSABS 0.0 01/26/2012 1112     BNP (last 3 results)  Basename 01/28/12 0540 01/03/12 2315 12/27/11 1810  PROBNP 8936.0* 9069.0* 7141.0*    CBG (last 3)  Basename 01/29/12 1600 01/29/12 1258 01/29/12 0423  GLUCAP 162* 144* 97    Imaging:  Dg Chest Port 1 View  01/29/2012  *RADIOLOGY REPORT*  Clinical Data: Shortness of breath and atelectasis.  PORTABLE CHEST - 1 VIEW  Comparison: 01/27/2012  Findings: There are persistent densities and volume loss at the left lung base.  Findings are suggestive for volume loss with pleural fluid.  Slightly improved aeration in the left mid lung. Right lung remains clear.  The heart size is grossly stable.  IMPRESSION: Persistent densities at the left lung base.  Findings most likely represent a combination of volume loss and pleural fluid.  Airspace disease cannot be excluded in this area.  Slightly improved aeration in the left mid lung.   Original Report Authenticated By: Richarda Overlie, M.D.     Other Data:  (2D echo, EKG...)   Educational Materials Given:  DNR: No MOST: No Healthcare Power-of-Attorney: Yes    Time: 50 minutes  Greater than 50%  of this time was spent counseling and coordinating care related to the above assessment and plan.  Signed by: Edsel Petrin, DO  01/29/2012, 4:42 PM  Please contact Palliative Medicine Team phone at [phone] for questions and concerns.

## 2012-01-30 LAB — GLUCOSE, CAPILLARY
Glucose-Capillary: 127 mg/dL — ABNORMAL HIGH (ref 70–99)
Glucose-Capillary: 93 mg/dL (ref 70–99)
Glucose-Capillary: 118 mg/dL — ABNORMAL HIGH (ref 70–99)

## 2012-01-30 NOTE — Progress Notes (Signed)
PULMONARY  / CRITICAL CARE MEDICINE  Name: [name] MRN: [id number] DOB: [birth year]    LOS: 5  REFERRING PROVIDER:  Zadie Rhine  CHIEF COMPLAINT:  Dislodged tracheostomy  BRIEF PATIENT DESCRIPTION:  71 yo female former smoker admitted on 01/25/2012 from NH after trach dislodgement.  Tracheostomy stoma trach noted to be closed in ED.  Had tracheostomy placed by [facility] 11/23/11.  Significant PMHx COPD, OSA, recurrent Lt lung collapse, CKD, CVA, HTN, Diastolic CHF, Hyperlipidemia, Gout, DM, GERD   LINES / TUBES: PIV  CULTURES: None  ANTIBIOTICS: None  EVENTS:  12/03 From NH to ED 12/05 Pt refused tracheostomy re-do 12/05 Speech therapy eval>>D3 diet, thin liquids 12/05 PEG tube dislodged>>not replaced.  LEVEL OF CARE:  Telemetry PRIMARY SERVICE:  PCCM CONSULTANTS:  Palliative care CODE STATUS:  Full DIET:  D3 DVT Px:  SQ heparin GI Px:  Protonix  INTERVAL HISTORY:  No complaints, ok cough mechanics, still air leak   VITAL SIGNS: Temp:  [98.5 F (36.9 C)-98.7 F (37.1 C)] 98.7 F (37.1 C) (12/08 1409) Pulse Rate:  [63-72] 70  (12/08 1409) Resp:  [18] 18  (12/08 1409) BP: (138-164)/(66-79) 138/70 mmHg (12/08 1409) SpO2:  [99 %-100 %] 99 % (12/08 1409) Weight:  [178 lb 3.2 oz (80.831 kg)] 178 lb 3.2 oz (80.831 kg) (12/08 0547)  FIO2 2L nasal cannula  PHYSICAL EXAMINATION: General:  No distress Neuro:  Alert, follows commands HEENT:  No sinus tenderness Cardiovascular:  s1s2 regular Lungs:  Decreased breath sounds Lt base, no wheeze Abdomen:  Soft, non tender, PEG site ostomy clean Musculoskeletal:  No edema Skin:  No rashes   Lab 01/29/12 0635 01/28/12 0540 01/27/12 0500  NA 139 141 143  K 4.0 3.9 4.6  CL 99 101 104  CO2 34* 33* 33*  BUN 27* 28* 27*  CREATININE 1.82* 1.78* 1.68*  GLUCOSE 96 112* 95    Lab 01/29/12 0635 01/28/12 0540 01/27/12 0500  HGB 8.8* 8.4* 8.6*  HCT 29.0* 27.2* 28.5*  WBC 8.0 7.5 7.0  PLT 205 202 179   Dg  Chest Port 1 View  01/29/2012  *RADIOLOGY REPORT*  Clinical Data: Shortness of breath and atelectasis.  PORTABLE CHEST - 1 VIEW  Comparison: 01/27/2012  Findings: There are persistent densities and volume loss at the left lung base.  Findings are suggestive for volume loss with pleural fluid.  Slightly improved aeration in the left mid lung. Right lung remains clear.  The heart size is grossly stable.  IMPRESSION: Persistent densities at the left lung base.  Findings most likely represent a combination of volume loss and pleural fluid.  Airspace disease cannot be excluded in this area.  Slightly improved aeration in the left mid lung.   Original Report Authenticated By: Richarda Overlie, M.D.    CBG (last 3)   Basename 01/30/12 1147 01/30/12 0610 01/29/12 1931  GLUCAP 118* 93 127*     ASSESSMENT / PLAN:  A: Hx of COPD. Recurrent Lt lung ATX. Acute on chronic respiratory failure. Hx of OSA. Pt declined tracheostomy replacement. P: Change BD's to scheduled and prn Bronchial hygiene Continue scopolamine to minimize secretions  A: hx of HTN with chronic diastolic dysfx. Hx of CVA, hyperlipidemia. -Echo from 12/06 - Left ventricle: The cavity size was normal. Wall thickness was increased in a pattern of severe LVH. Systolic function was mildly reduced. The estimated ejection fraction was in the range of 45% to 50%. Diffuse hypokinesis. Doppler parameters are consistent with abnormal left  ventricular relaxation (grade 1 diastolic dysfunction). - Aortic valve: Trileaflet; moderately calcified leaflets. There was very mild stenosis. Mean gradient: 10mm Hg (S). Peak gradient: 20mm Hg (S). - Mitral valve: Mildly calcified annulus. Trivial regurgitation. - Left atrium: The atrium was moderately dilated. - Right ventricle: The cavity size was normal. Systolic function was normal. - Right atrium: The atrium was moderately dilated. - Pulmonary arteries: No complete TR doppler jet so unable to  estimate PA systolic pressure  P: Continue ASA, coreg, hydralazine, torsemide, simvastatin    A: Dysphagia. Hx of GERD. P: D3 diet Continue protonix  A: DM type II. Not needing insulin. P: Monitor CBG's  A: CKD. P: F/u renal fx, monitor urine outpt  A: Anemia of chronic disease. P: F/u CBC  A: Hx of Gout. P: Continue allopurinol  A: Goals of care. P: Palliative care following, still wants to  Be full code blue status  Disposition:  will likely be ready for d/c home soon   Sandrea Hughs, MD Pulmonary and Critical Care Medicine [facility] Cell [phone]   After 3pm call: [phone]

## 2012-01-30 NOTE — Clinical Social Work Psychosocial (Addendum)
    Clinical Social Work Department BRIEF PSYCHOSOCIAL ASSESSMENT 01/30/2012  Patient:  [name], [name]     Account Number:  [id number]     Admit date:  01/25/2012  Clinical Social Worker:  Tiburcio Pea  Date/Time:  01/27/2012 04:45 PM  Referred by:  Physician  Date Referred:  01/26/2012 Referred for  SNF Placement   Other Referral:   Interview type:  Patient Other interview type:    PSYCHOSOCIAL DATA Living Status:  FACILITY Admitted from facility:  [facility] [place] Level of care:  Skilled Nursing Facility Primary support name:   Primary support relationship to patient:   Degree of support available:    CURRENT CONCERNS Current Concerns  Post-Acute Placement   Other Concerns:   (Return)    SOCIAL WORK ASSESSMENT / PLAN [facility]  Met with patient- she has had her trach removed and per nursing - is doing quite well. Patient is alert and oriented; she is able to communicate verbally. Patient states that she wants to return to SNF when medically stable.  Spoke to [employer]- RN Liason who stated that they will have a bed available for patient when medically stable.   Fl2 will be placed on chart for MD's signature.   Assessment/plan status:  Psychosocial Support/Ongoing Assessment of Needs Other assessment/ plan:   Information/referral to community resources:   None at this time    PATIENT'S/FAMILY'S RESPONSE TO PLAN OF CARE: Patient was appreciative of CSW's visit and stated that she and her family want return to the SNF.  Patient denied any further questions or concerns.

## 2012-01-30 NOTE — Progress Notes (Signed)
Palliative Care note indicates patient desires to continue to be a full code. She plans to return to the SNF when medically stable per MD. Bed will be available for patient when medically stable. Weekend CSW is aware and will assist if indicated; otherwise- will re-evaluate tomorrow.  Lorri Frederick. [place] P[place]  [phone]

## 2012-01-31 LAB — GLUCOSE, CAPILLARY
Glucose-Capillary: 116 mg/dL — ABNORMAL HIGH (ref 70–99)
Glucose-Capillary: 90 mg/dL (ref 70–99)

## 2012-01-31 MED ORDER — ALBUTEROL SULFATE (5 MG/ML) 0.5% IN NEBU: 2.5000 mg | INHALATION_SOLUTION | Freq: Three times a day (TID) | RESPIRATORY_TRACT | Status: DC

## 2012-01-31 MED ORDER — HYDRALAZINE HCL 25 MG PO TABS: 12.5000 mg | ORAL_TABLET | Freq: Three times a day (TID) | ORAL | Status: DC

## 2012-01-31 MED ORDER — ALBUTEROL SULFATE (5 MG/ML) 0.5% IN NEBU: 2.5000 mg | INHALATION_SOLUTION | RESPIRATORY_TRACT | Status: DC | PRN

## 2012-01-31 MED ORDER — IPRATROPIUM BROMIDE 0.02 % IN SOLN: 0.5000 mg | Freq: Three times a day (TID) | RESPIRATORY_TRACT | Status: DC

## 2012-01-31 MED ORDER — ESCITALOPRAM OXALATE 10 MG PO TABS: 10.0000 mg | ORAL_TABLET | Freq: Every day | ORAL | Status: DC

## 2012-01-31 NOTE — Progress Notes (Signed)
PCCM  Pt ready for d/c to SNF  She has a ready bed at [facility] is full code.  D/c on:  Same med list as current inpt list except:  D/c hep sq  Bronchodilators to be scheduled and not prn as before  Scopolamine patch daily for secretions  Oxygen  2Liter rest  3Liter exertion   Dorcas Carrow

## 2012-01-31 NOTE — Discharge Summary (Signed)
Physician Discharge Summary     Patient ID: [name] MRN: [id number] DOB/AGE: Jul 05, 1940 71 y.o.  Admit date: 01/25/2012 Discharge date: 01/31/2012  Admission Diagnoses: Trach dislodgement   Discharge Diagnoses:  Active Problems:  Obesity (BMI 30-39.9)  Sleep apnea  CORONARY ARTERY DISEASE  C O P D  Cardiomyopathy of undetermined type  Tracheostomy complication   Significant Hospital tests/ studies/ interventions and procedures  EVENTS:  12/03 From NH to ED  12/05 Pt refused tracheostomy re-do  12/05 Speech therapy eval>>D3 diet, thin liquids  12/05 PEG tube dislodged>>not replaced.  BRIEF PATIENT DESCRIPTION:  71 yo with COPD / OSA / was admitted 3 days after a discharge from SNF on 11/14/11 with hypercarbic respiratory failure And had issues of Recurrent intubatin due to recurrent left lung collapse / post intubation vocal cord edema . Underwent percutaneous tracheostomy placed 11/23/11 by Dr. Tyson Alias and discharged 11/25/11. After this she had an admission in mid nov 2013 for abd wall cellultitis. However, this time she was brought to [facility] on 12/3 after tracheostomy was dislodged in the nursing home. In ED: tracheostomy tube appears out of the tract with stoma completely closed. Patient denies any shortness of breath. PCCM addmitted 01/25/2012   Significant PMHx COPD, OSA, recurrent Lt lung collapse, CKD, CVA, HTN, Diastolic CHF, Hyperlipidemia, Gout, DM, GERD  Hospital Course:  Hx of COPD.  Recurrent Lt lung ATX.  Acute on chronic respiratory failure.  Hx of OSA.  Admitted after trach dislodged. Pt declined tracheostomy replacement. Monitored closely. Seems to be doing ok with protecting airway. Ready for d/c back to SNF P:  Back to SNF on scheduled BDs Bronchial hygiene  Continue scopolamine to minimize secretions Oxygen 2 at rest-->increase to 3 liters w/ exertion,   hx of HTN with chronic diastolic dysfx.  Hx of CVA, hyperlipidemia No acute issues  during this hospitalization, no changes required to typical regimen.  Plan: Continue ASA, coreg, hydralazine, torsemide, simvastatin  Dysphagia.  Hx of GERD.  Had PEG dislodged on 12/5. Had SLP eval who recommended Dys 3 diet. PEG was not replaced.  P:  D3 diet  Continue protonix  DM II P: Diet controlled  CKD.  Recent Labs  [facility] 01/29/12 0635   CREATININE 1.82*  No acute issues.  P:  F/u renal fx, monitor urine outpt  Anemia of chronic disease.   Lab 01/29/12 0635 01/28/12 0540 01/27/12 0500  HGB 8.8* 8.4* 8.6*  P:  F/u CBC  Hx of Gout.  P:  Continue allopurinol  Goals of care.  P:  Palliative care following, still wants to Be full code blue status    Discharge Exam: BP 151/60  Pulse 64  Temp 98.5 F (36.9 C) (Oral)  Resp 20  Ht 5\' 4"  (1.626 m)  Wt 80.015 kg (176 lb 6.4 oz)  BMI 30.28 kg/m2  SpO2 100%  FIO2 2L nasal cannula  PHYSICAL EXAMINATION:  General: No distress  Neuro: Alert, follows commands  HEENT: No sinus tenderness  Cardiovascular: s1s2 regular  Lungs: Decreased breath sounds Lt base, no wheeze  Abdomen: Soft, non tender, PEG site ostomy clean  Musculoskeletal: No edema  Skin: No rashes   Labs at discharge Lab Results  Component Value Date   CREATININE 1.82* 01/29/2012   BUN 27* 01/29/2012   NA 139 01/29/2012   K 4.0 01/29/2012   CL 99 01/29/2012   CO2 34* 01/29/2012   Lab Results  Component Value Date   WBC 8.0 01/29/2012   HGB  8.8* 01/29/2012   HCT 29.0* 01/29/2012   MCV 81.5 01/29/2012   PLT 205 01/29/2012   Lab Results  Component Value Date   ALT 12 01/03/2012   AST 22 01/03/2012   ALKPHOS 86 01/03/2012   BILITOT 0.2* 01/03/2012   Lab Results  Component Value Date   INR 1.04 01/03/2012   INR 1.08 12/13/2011   INR 1.09 11/23/2011    Current radiology studies No results found.  Disposition:  SNF      Discharge Orders    Future Orders Please Complete By Expires   Diet - low sodium heart healthy       Scheduling Instructions:   Dysphagia 3 diet Needs SLP follow up   Increase activity slowly          Medication List     As of 01/31/2012 11:54 AM    STOP taking these medications         feeding supplement Liqd      feeding supplement Liqd      ipratropium-albuterol 0.5-2.5 (3) MG/3ML Soln   Commonly known as: DUONEB      TAKE these medications         albuterol (5 MG/ML) 0.5% nebulizer solution   Commonly known as: PROVENTIL   Take 0.5 mLs (2.5 mg total) by nebulization every 2 (two) hours as needed for wheezing or shortness of breath.      albuterol (5 MG/ML) 0.5% nebulizer solution   Commonly known as: PROVENTIL   Take 0.5 mLs (2.5 mg total) by nebulization 3 (three) times daily.      allopurinol 100 MG tablet   Commonly known as: ZYLOPRIM   Take 100 mg by mouth daily.      aspirin 81 MG chewable tablet   Chew 81 mg by mouth daily.      carvedilol 12.5 MG tablet   Commonly known as: COREG   Take 12.5 mg by mouth 2 (two) times daily with a meal.      cholecalciferol 1000 UNITS tablet   Commonly known as: VITAMIN D   Take 2,000 Units by mouth daily.      escitalopram 10 MG tablet   Commonly known as: LEXAPRO   Take 1 tablet (10 mg total) by mouth daily.      hydrALAZINE 25 MG tablet   Commonly known as: APRESOLINE   Take 0.5 tablets (12.5 mg total) by mouth 3 (three) times daily.      ipratropium 0.02 % nebulizer solution   Commonly known as: ATROVENT   Take 2.5 mLs (0.5 mg total) by nebulization 3 (three) times daily.      omeprazole 20 MG capsule   Commonly known as: PRILOSEC   Take 20 mg by mouth daily.      pravastatin 40 MG tablet   Commonly known as: PRAVACHOL   Take 40 mg by mouth every evening.      scopolamine 1.5 MG   Commonly known as: TRANSDERM-SCOP   Place 1 patch onto the skin every 3 (three) days.      torsemide 20 MG tablet   Commonly known as: DEMADEX   Take 20 mg by mouth daily.          Discharged Condition:  {fair Physician Statement:   The Patient was personally examined, the discharge assessment and plan has been personally reviewed and I agree with ACNP Babcock's assessment and plan. > 30 minutes of time have been dedicated to discharge assessment, planning and discharge instructions.  Shan Levans MD  Signed: Anders Simmonds 01/31/2012, 11:54 AM

## 2012-01-31 NOTE — Progress Notes (Signed)
SLP Cancellation Note  Patient Details Name: [name] MRN: [id number] DOB: [birth year]   Cancelled treatment:        Pt preparing for DC. RN present in room.  RN and pt confirm no difficulty with po intake, no s/s aspiration. SLP signing off due to DC from this facility.  Ahlayah Tarkowski B. Layn Kye, [facility], CCC-SLP [phone]  01/31/2012, 1:14 PM

## 2012-01-31 NOTE — Progress Notes (Signed)
OK per MD for d/c back to [facility] today via EMS.  Notified Reyne Dumas, RN Liason for [facility] and bed is available.  Discussed with patient who is pleased with d/c plan.  She states that her daughter is having a dialysis procedure today but when family checked in she told them she would be returning to facility.  Unable to reach pt's daughter due to above.  Pt's nurse- [place] notified of d/c. Lorri Frederick. [place]  [phone]

## 2012-02-14 ENCOUNTER — Encounter (HOSPITAL_COMMUNITY): Payer: Self-pay | Admitting: *Deleted

## 2012-02-14 ENCOUNTER — Inpatient Hospital Stay (HOSPITAL_COMMUNITY)
Admission: EM | Admit: 2012-02-14 | Discharge: 2012-02-19 | DRG: 004 | Disposition: A | Discharge status: Other Acute Inpatient Hospital | Payer: Medicare Other | Attending: Emergency Medicine | Admitting: Emergency Medicine

## 2012-02-14 ENCOUNTER — Other Ambulatory Visit: Payer: Self-pay

## 2012-02-14 ENCOUNTER — Emergency Department (HOSPITAL_COMMUNITY): Payer: Medicare Other

## 2012-02-14 DIAGNOSIS — G4733 Obstructive sleep apnea (adult) (pediatric): Secondary | ICD-10-CM

## 2012-02-14 DIAGNOSIS — Z87891 Personal history of nicotine dependence: Secondary | ICD-10-CM

## 2012-02-14 DIAGNOSIS — E662 Morbid (severe) obesity with alveolar hypoventilation: Secondary | ICD-10-CM | POA: Diagnosis present

## 2012-02-14 DIAGNOSIS — I798 Other disorders of arteries, arterioles and capillaries in diseases classified elsewhere: Secondary | ICD-10-CM | POA: Diagnosis present

## 2012-02-14 DIAGNOSIS — J449 Chronic obstructive pulmonary disease, unspecified: Secondary | ICD-10-CM

## 2012-02-14 DIAGNOSIS — Z7982 Long term (current) use of aspirin: Secondary | ICD-10-CM

## 2012-02-14 DIAGNOSIS — J4489 Other specified chronic obstructive pulmonary disease: Secondary | ICD-10-CM

## 2012-02-14 DIAGNOSIS — L0390 Cellulitis, unspecified: Secondary | ICD-10-CM

## 2012-02-14 DIAGNOSIS — F17200 Nicotine dependence, unspecified, uncomplicated: Secondary | ICD-10-CM

## 2012-02-14 DIAGNOSIS — Z8673 Personal history of transient ischemic attack (TIA), and cerebral infarction without residual deficits: Secondary | ICD-10-CM

## 2012-02-14 DIAGNOSIS — I130 Hypertensive heart and chronic kidney disease with heart failure and stage 1 through stage 4 chronic kidney disease, or unspecified chronic kidney disease: Principal | ICD-10-CM | POA: Diagnosis present

## 2012-02-14 DIAGNOSIS — N184 Chronic kidney disease, stage 4 (severe): Secondary | ICD-10-CM

## 2012-02-14 DIAGNOSIS — I2510 Atherosclerotic heart disease of native coronary artery without angina pectoris: Secondary | ICD-10-CM

## 2012-02-14 DIAGNOSIS — L03311 Cellulitis of abdominal wall: Secondary | ICD-10-CM

## 2012-02-14 DIAGNOSIS — D696 Thrombocytopenia, unspecified: Secondary | ICD-10-CM | POA: Diagnosis not present

## 2012-02-14 DIAGNOSIS — J9500 Unspecified tracheostomy complication: Secondary | ICD-10-CM | POA: Diagnosis present

## 2012-02-14 DIAGNOSIS — J189 Pneumonia, unspecified organism: Secondary | ICD-10-CM

## 2012-02-14 DIAGNOSIS — M109 Gout, unspecified: Secondary | ICD-10-CM

## 2012-02-14 DIAGNOSIS — N189 Chronic kidney disease, unspecified: Principal | ICD-10-CM | POA: Diagnosis present

## 2012-02-14 DIAGNOSIS — K7460 Unspecified cirrhosis of liver: Secondary | ICD-10-CM

## 2012-02-14 DIAGNOSIS — I5021 Acute systolic (congestive) heart failure: Secondary | ICD-10-CM

## 2012-02-14 DIAGNOSIS — I429 Cardiomyopathy, unspecified: Secondary | ICD-10-CM

## 2012-02-14 DIAGNOSIS — K219 Gastro-esophageal reflux disease without esophagitis: Secondary | ICD-10-CM | POA: Diagnosis present

## 2012-02-14 DIAGNOSIS — I452 Bifascicular block: Secondary | ICD-10-CM

## 2012-02-14 DIAGNOSIS — D649 Anemia, unspecified: Secondary | ICD-10-CM

## 2012-02-14 DIAGNOSIS — E875 Hyperkalemia: Secondary | ICD-10-CM | POA: Diagnosis present

## 2012-02-14 DIAGNOSIS — I279 Pulmonary heart disease, unspecified: Secondary | ICD-10-CM

## 2012-02-14 DIAGNOSIS — J9620 Acute and chronic respiratory failure, unspecified whether with hypoxia or hypercapnia: Secondary | ICD-10-CM | POA: Diagnosis present

## 2012-02-14 DIAGNOSIS — Z794 Long term (current) use of insulin: Secondary | ICD-10-CM

## 2012-02-14 DIAGNOSIS — I428 Other cardiomyopathies: Secondary | ICD-10-CM | POA: Diagnosis present

## 2012-02-14 DIAGNOSIS — J9610 Chronic respiratory failure, unspecified whether with hypoxia or hypercapnia: Secondary | ICD-10-CM | POA: Diagnosis present

## 2012-02-14 DIAGNOSIS — J45909 Unspecified asthma, uncomplicated: Secondary | ICD-10-CM

## 2012-02-14 DIAGNOSIS — N179 Acute kidney failure, unspecified: Secondary | ICD-10-CM

## 2012-02-14 DIAGNOSIS — J9602 Acute respiratory failure with hypercapnia: Secondary | ICD-10-CM | POA: Diagnosis present

## 2012-02-14 DIAGNOSIS — E669 Obesity, unspecified: Secondary | ICD-10-CM

## 2012-02-14 DIAGNOSIS — E785 Hyperlipidemia, unspecified: Secondary | ICD-10-CM

## 2012-02-14 DIAGNOSIS — I509 Heart failure, unspecified: Secondary | ICD-10-CM | POA: Diagnosis present

## 2012-02-14 DIAGNOSIS — I2789 Other specified pulmonary heart diseases: Secondary | ICD-10-CM

## 2012-02-14 DIAGNOSIS — M7989 Other specified soft tissue disorders: Secondary | ICD-10-CM

## 2012-02-14 DIAGNOSIS — M129 Arthropathy, unspecified: Secondary | ICD-10-CM | POA: Diagnosis present

## 2012-02-14 DIAGNOSIS — E1159 Type 2 diabetes mellitus with other circulatory complications: Secondary | ICD-10-CM

## 2012-02-14 DIAGNOSIS — Z79899 Other long term (current) drug therapy: Secondary | ICD-10-CM

## 2012-02-14 DIAGNOSIS — I5023 Acute on chronic systolic (congestive) heart failure: Secondary | ICD-10-CM

## 2012-02-14 DIAGNOSIS — G9340 Encephalopathy, unspecified: Secondary | ICD-10-CM

## 2012-02-14 DIAGNOSIS — I469 Cardiac arrest, cause unspecified: Secondary | ICD-10-CM

## 2012-02-14 DIAGNOSIS — I110 Hypertensive heart disease with heart failure: Secondary | ICD-10-CM

## 2012-02-14 HISTORY — DX: Thrombocytopenia, unspecified: D69.6

## 2012-02-14 HISTORY — DX: Acute and chronic respiratory failure, unspecified whether with hypoxia or hypercapnia: J96.20

## 2012-02-14 LAB — BASIC METABOLIC PANEL
CO2: 30 mEq/L (ref 19–32)
Calcium: 9.6 mg/dL (ref 8.4–10.5)
Creatinine, Ser: 1.96 mg/dL — ABNORMAL HIGH (ref 0.50–1.10)
GFR calc Af Amer: 28 mL/min — ABNORMAL LOW (ref >90)
GFR calc non Af Amer: 24 mL/min — ABNORMAL LOW (ref >90)
Sodium: 137 mEq/L (ref 135–145)

## 2012-02-14 LAB — URINALYSIS, ROUTINE W REFLEX MICROSCOPIC
Ketones, ur: NEGATIVE mg/dL
Leukocytes, UA: NEGATIVE
Nitrite: NEGATIVE
Protein, ur: 100 mg/dL — AB
Urobilinogen, UA: 0.2 mg/dL (ref 0.0–1.0)

## 2012-02-14 LAB — CBC WITH DIFFERENTIAL/PLATELET
Basophils Absolute: 0 10*3/uL (ref 0.0–0.1)
Basophils Relative: 0 % (ref 0–1)
Eosinophils Absolute: 0.1 10*3/uL (ref 0.0–0.7)
Eosinophils Relative: 2 % (ref 0–5)
Lymphocytes Relative: 24 % (ref 12–46)
MCHC: 29.9 g/dL — ABNORMAL LOW (ref 30.0–36.0)
MCV: 83.5 fL (ref 78.0–100.0)
Platelets: 184 10*3/uL (ref 150–400)
RDW: 17.5 % — ABNORMAL HIGH (ref 11.5–15.5)
WBC: 8.3 10*3/uL (ref 4.0–10.5)

## 2012-02-14 LAB — URINE MICROSCOPIC-ADD ON

## 2012-02-14 MED ORDER — PIPERACILLIN-TAZOBACTAM 3.375 G IVPB
3.3750 g | Freq: Once | INTRAVENOUS | Status: AC
  Administered 2012-02-15: 3.375 g via INTRAVENOUS
  Filled 2012-02-14: qty 50

## 2012-02-14 MED ORDER — VANCOMYCIN HCL IN DEXTROSE 1-5 GM/200ML-% IV SOLN
1000.0000 mg | Freq: Once | INTRAVENOUS | Status: DC
  Filled 2012-02-14: qty 200

## 2012-02-14 MED ORDER — SODIUM CHLORIDE 0.9 % IV BOLUS (SEPSIS)
1000.0000 mL | Freq: Once | INTRAVENOUS | Status: AC
  Administered 2012-02-14: 1000 mL via INTRAVENOUS

## 2012-02-14 NOTE — ED Notes (Addendum)
EMS-pt is from [facility], was dx with pneumonia today via chest xray, family wanted pt transported for further care. Pt is on O2 at 2L at all times. Pt with SOB, increased BNP, and lower extremity edema.

## 2012-02-14 NOTE — ED Notes (Addendum)
Pt had an un witnessed fall from her bed.  Both bed rails were up and pt had call bell.  Pt st's she had to go to the bathroom so she got out of bed.  Pt found in floor, no change in pt condition.

## 2012-02-15 ENCOUNTER — Emergency Department (HOSPITAL_COMMUNITY): Payer: Medicare Other

## 2012-02-15 DIAGNOSIS — I5023 Acute on chronic systolic (congestive) heart failure: Secondary | ICD-10-CM

## 2012-02-15 DIAGNOSIS — G9340 Encephalopathy, unspecified: Secondary | ICD-10-CM

## 2012-02-15 DIAGNOSIS — I469 Cardiac arrest, cause unspecified: Secondary | ICD-10-CM

## 2012-02-15 DIAGNOSIS — J189 Pneumonia, unspecified organism: Secondary | ICD-10-CM

## 2012-02-15 DIAGNOSIS — J9600 Acute respiratory failure, unspecified whether with hypoxia or hypercapnia: Secondary | ICD-10-CM

## 2012-02-15 DIAGNOSIS — N189 Chronic kidney disease, unspecified: Secondary | ICD-10-CM

## 2012-02-15 DIAGNOSIS — N179 Acute kidney failure, unspecified: Secondary | ICD-10-CM

## 2012-02-15 LAB — POCT I-STAT 3, ART BLOOD GAS (G3+)
Acid-Base Excess: 1 mmol/L (ref 0.0–2.0)
Bicarbonate: 27.5 mEq/L — ABNORMAL HIGH (ref 20.0–24.0)
Patient temperature: 98.6
Patient temperature: 98.6
pH, Arterial: 7.402 (ref 7.350–7.450)
pO2, Arterial: 274 mmHg — ABNORMAL HIGH (ref 80.0–100.0)

## 2012-02-15 LAB — BASIC METABOLIC PANEL
Calcium: 8.8 mg/dL (ref 8.4–10.5)
Chloride: 103 mEq/L (ref 96–112)
Creatinine, Ser: 2.01 mg/dL — ABNORMAL HIGH (ref 0.50–1.10)
GFR calc Af Amer: 27 mL/min — ABNORMAL LOW (ref >90)
GFR calc non Af Amer: 24 mL/min — ABNORMAL LOW (ref >90)

## 2012-02-15 LAB — PROCALCITONIN: Procalcitonin: <0.1 ng/mL

## 2012-02-15 LAB — MAGNESIUM: Magnesium: 1.7 mg/dL (ref 1.5–2.5)

## 2012-02-15 LAB — GLUCOSE, CAPILLARY
Glucose-Capillary: 126 mg/dL — ABNORMAL HIGH (ref 70–99)
Glucose-Capillary: 75 mg/dL (ref 70–99)
Glucose-Capillary: 87 mg/dL (ref 70–99)
Glucose-Capillary: 84 mg/dL (ref 70–99)
Glucose-Capillary: 81 mg/dL (ref 70–99)

## 2012-02-15 LAB — CBC
Platelets: 136 10*3/uL — ABNORMAL LOW (ref 150–400)
RDW: 17.5 % — ABNORMAL HIGH (ref 11.5–15.5)
WBC: 13.8 10*3/uL — ABNORMAL HIGH (ref 4.0–10.5)

## 2012-02-15 MED ORDER — SIMVASTATIN 20 MG PO TABS
20.0000 mg | ORAL_TABLET | Freq: Every day | ORAL | Status: DC
  Administered 2012-02-15 – 2012-02-18 (×4): 20 mg via ORAL
  Filled 2012-02-15 (×5): qty 1

## 2012-02-15 MED ORDER — BIOTENE DRY MOUTH MT LIQD
1.0000 "application " | Freq: Four times a day (QID) | OROMUCOSAL | Status: DC
  Administered 2012-02-15 – 2012-02-19 (×16): 15 mL via OROMUCOSAL

## 2012-02-15 MED ORDER — PROPOFOL 10 MG/ML IV EMUL
INTRAVENOUS | Status: AC
  Administered 2012-02-15: 10 ug/kg/min via INTRAVENOUS
  Filled 2012-02-15: qty 100

## 2012-02-15 MED ORDER — DEXTROSE 50 % IV SOLN
INTRAVENOUS | Status: AC
  Administered 2012-02-15: 25 mL
  Filled 2012-02-15: qty 50

## 2012-02-15 MED ORDER — EPINEPHRINE HCL 0.1 MG/ML IJ SOLN
INTRAMUSCULAR | Status: DC | PRN
  Administered 2012-02-14: 1 mg via INTRAVENOUS

## 2012-02-15 MED ORDER — PROPOFOL 10 MG/ML IV EMUL
5.0000 ug/kg/min | Freq: Once | INTRAVENOUS | Status: AC
  Administered 2012-02-15: 10 ug/kg/min via INTRAVENOUS

## 2012-02-15 MED ORDER — SODIUM CHLORIDE 0.9 % IV SOLN
1.0000 mg/h | Freq: Once | INTRAVENOUS | Status: AC
  Administered 2012-02-15: 2 mg/h via INTRAVENOUS
  Filled 2012-02-15: qty 10

## 2012-02-15 MED ORDER — PANTOPRAZOLE SODIUM 40 MG IV SOLR
40.0000 mg | INTRAVENOUS | Status: DC
  Administered 2012-02-15 – 2012-02-16 (×2): 40 mg via INTRAVENOUS
  Filled 2012-02-15 (×3): qty 40

## 2012-02-15 MED ORDER — ESCITALOPRAM OXALATE 10 MG PO TABS
10.0000 mg | ORAL_TABLET | Freq: Every day | ORAL | Status: DC
  Administered 2012-02-15 – 2012-02-19 (×5): 10 mg via ORAL
  Filled 2012-02-15 (×5): qty 1

## 2012-02-15 MED ORDER — IPRATROPIUM-ALBUTEROL 18-103 MCG/ACT IN AERO
6.0000 | INHALATION_SPRAY | RESPIRATORY_TRACT | Status: DC
  Administered 2012-02-15 – 2012-02-19 (×25): 6 via RESPIRATORY_TRACT
  Filled 2012-02-15: qty 14.7

## 2012-02-15 MED ORDER — SODIUM CHLORIDE 0.9 % IV SOLN: 25.0000 ug/h | INTRAVENOUS | Status: DC

## 2012-02-15 MED ORDER — LEVOFLOXACIN IN D5W 750 MG/150ML IV SOLN
750.0000 mg | INTRAVENOUS | Status: DC
  Administered 2012-02-16 – 2012-02-18 (×2): 750 mg via INTRAVENOUS
  Filled 2012-02-15 (×2): qty 150

## 2012-02-15 MED ORDER — ASPIRIN 81 MG PO TABS: 81.0000 mg | ORAL_TABLET | Freq: Every day | ORAL | Status: DC

## 2012-02-15 MED ORDER — HEPARIN SODIUM (PORCINE) 5000 UNIT/ML IJ SOLN
5000.0000 [IU] | Freq: Three times a day (TID) | INTRAMUSCULAR | Status: DC
  Administered 2012-02-15 – 2012-02-19 (×11): 5000 [IU] via SUBCUTANEOUS
  Filled 2012-02-15 (×16): qty 1

## 2012-02-15 MED ORDER — CHLORHEXIDINE GLUCONATE 0.12 % MT SOLN
15.0000 mL | Freq: Two times a day (BID) | OROMUCOSAL | Status: DC
  Administered 2012-02-15 – 2012-02-19 (×9): 15 mL via OROMUCOSAL
  Filled 2012-02-15 (×10): qty 15

## 2012-02-15 MED ORDER — SODIUM CHLORIDE 0.9 % IV SOLN: INTRAVENOUS | Status: DC

## 2012-02-15 MED ORDER — LEVOFLOXACIN IN D5W 750 MG/150ML IV SOLN
750.0000 mg | Freq: Once | INTRAVENOUS | Status: AC
  Administered 2012-02-15: 750 mg via INTRAVENOUS
  Filled 2012-02-15: qty 150

## 2012-02-15 MED ORDER — ASPIRIN 81 MG PO CHEW
81.0000 mg | CHEWABLE_TABLET | Freq: Every day | ORAL | Status: DC
  Administered 2012-02-15 – 2012-02-19 (×5): 81 mg via ORAL
  Filled 2012-02-15 (×4): qty 1

## 2012-02-15 MED ORDER — LEVOFLOXACIN IN D5W 750 MG/150ML IV SOLN: 750.0000 mg | INTRAVENOUS | Status: DC

## 2012-02-15 MED ORDER — HYDRALAZINE HCL 20 MG/ML IJ SOLN
10.0000 mg | INTRAMUSCULAR | Status: DC | PRN
  Administered 2012-02-15: 40 mg via INTRAVENOUS
  Administered 2012-02-16 – 2012-02-19 (×3): 20 mg via INTRAVENOUS
  Filled 2012-02-15 (×2): qty 2

## 2012-02-15 MED ORDER — CARVEDILOL 6.25 MG PO TABS
6.2500 mg | ORAL_TABLET | Freq: Two times a day (BID) | ORAL | Status: DC
  Administered 2012-02-15 – 2012-02-17 (×4): 6.25 mg via ORAL
  Filled 2012-02-15 (×6): qty 1

## 2012-02-15 MED ORDER — MIDAZOLAM HCL 5 MG/ML IJ SOLN: 1.0000 mg/h | INTRAMUSCULAR | Status: DC

## 2012-02-15 MED ORDER — SODIUM CHLORIDE 0.9 % IV SOLN
25.0000 ug/h | Freq: Once | INTRAVENOUS | Status: AC
  Administered 2012-02-15: 50 ug/h via INTRAVENOUS
  Filled 2012-02-15: qty 50

## 2012-02-15 MED ORDER — IPRATROPIUM-ALBUTEROL 18-103 MCG/ACT IN AERO: 6.0000 | INHALATION_SPRAY | RESPIRATORY_TRACT | Status: DC | PRN

## 2012-02-15 MED FILL — Medication: Qty: 1 | Status: AC

## 2012-02-15 NOTE — H&P (Signed)
PULMONARY  / CRITICAL CARE MEDICINE  Name: [name] MRN: [id number] DOB: 05-Aug-1940    LOS: 1  REFERRING MD:  EDP  CHIEF COMPLAINT:  Cardiac arrest  BRIEF PATIENT DESCRIPTION:  71 yo NH resident with COPD, OSA, recurrent lung collapse and previous percutaneous tracheostomy brought to [facility] for treatment of "pneumonia" who suffered cardiac arrest while awaiting evaluation, was intubated and resuscitated.  LINES / TUBES: OETT  12/24 >>> OGT  12/24 >>> Foley  12/24 >>>  CULTURES: 12/24  Blood >>> 12/24  Respiratory >>>  ANTIBIOTICS: Zosyn 12/24 >>> Vancomycin 12/24 >>> Levaquin 12/24 >>>  SIGNIFICANT EVENTS:  12/24  Brought to [facility] for treatment of "pneumonia", suffered cardiac arrest while awaiting evaluation, was intubated and resuscitated.  LEVEL OF CARE:  ICU  PRIMARY SERVICE:  PCCM  CONSULTANTS:    CODE STATUS:  Full  DIET:  NPO  DVT Px:  Heparin  GI Px:  Protonix  The patient is encephalopathic and unable to provide history, which was obtained for available medical records.  HISTORY OF PRESENT ILLNESS:  71 yo NH resident with COPD, OSA, recurrent lung collapse and previous percutaneous tracheostomy brought to [facility] for treatment of "pneumonia" who suffered cardiac arrest while awaiting evaluation, was intubated and resuscitated.  PAST MEDICAL HISTORY :  Past Medical History  Diagnosis Date  . COPD (chronic obstructive pulmonary disease)     on home o2  . Chronic renal insufficiency   . CVA (cerebral infarction)     hx  . Hypertensive heart disease with congestive heart failure   . Chronic cor pulmonale   . Cardiomyopathy of undetermined type 09/07/2011  . Chronic kidney disease (CKD), stage IV (severe)   . Hyperlipdemia   . Obesity (BMI 30-39.9)   . Sleep apnea   . History of gout 09/07/2011  . Heart failure 7/13  . Shortness of breath   . Anemia   . Vascular disease   . Asthma   . Anginal pain   . Hypertension   . Type II or unspecified type  diabetes mellitus without mention of complication, not stated as uncontrolled   . Stroke   . GERD (gastroesophageal reflux disease)   . Arthritis   . Bundle branch block   . Thrombocytopenia   . Acute and chronic respiratory failure    Past Surgical History  Procedure Date  . Bilateral oophorectomy   . Cardiac catheterization     right heart cath  . Laparoscopic gastrostomy 12/21/2011    Procedure: LAPAROSCOPIC GASTROSTOMY;  Surgeon: Axel Filler, MD;  Location: [facility] OR;  Service: General;  Laterality: N/A;  laparoscopic gastrostomy possible open feeding tube  . Abdominal hysterectomy   . Tracheostomy 11/2011  . Cataracts    Prior to Admission medications   Medication Sig Start Date End Date Taking? Authorizing Provider  albuterol (PROVENTIL) (5 MG/ML) 0.5% nebulizer solution Take 2.5 mg by nebulization 3 (three) times daily. 01/31/12  Yes Simonne Martinet, NP  allopurinol (ZYLOPRIM) 100 MG tablet Take 100 mg by mouth daily.   Yes Historical Provider, MD  amoxicillin-clavulanate (AUGMENTIN) 875-125 MG per tablet Take 1 tablet by mouth 2 (two) times daily. Take for 7 days. Began regimen on 12/23 and should complete on 02/20/2012   Yes Historical Provider, MD  aspirin 81 MG tablet Take 81 mg by mouth daily.   Yes Historical Provider, MD  carvedilol (COREG) 12.5 MG tablet Take 12.5 mg by mouth 2 (two) times daily with a meal. Hold for systolic  blood pressure less than 110 and pulse less than 55   Yes Historical Provider, MD  Cholecalciferol (VITAMIN D) 2000 UNITS tablet Take 2,000 Units by mouth daily.   Yes Historical Provider, MD  escitalopram (LEXAPRO) 10 MG tablet Take 1 tablet (10 mg total) by mouth daily. 01/31/12  Yes Simonne Martinet, NP  hydrALAZINE (APRESOLINE) 25 MG tablet Take 25 mg by mouth 3 (three) times daily. 01/31/12  Yes Simonne Martinet, NP  insulin aspart (NOVOLOG) 100 UNIT/ML injection Inject 5 Units into the skin 3 (three) times daily before meals. Inject per sliding  scale:0-149=0 units, 150 or greater=5 units 15 minutes before or 30 minutes after a meal, every 6 hours   Yes Historical Provider, MD  ipratropium (ATROVENT) 0.02 % nebulizer solution Take 0.5 mg by nebulization 3 (three) times daily. 01/31/12  Yes Simonne Martinet, NP  ipratropium-albuterol (DUONEB) 0.5-2.5 (3) MG/3ML SOLN Take 3 mLs by nebulization every 6 (six) hours as needed.   Yes Historical Provider, MD  nitroGLYCERIN (NITROSTAT) 0.4 MG SL tablet Place 0.4 mg under the tongue every 5 (five) minutes as needed. For chest pain   Yes Historical Provider, MD  omeprazole (PRILOSEC) 20 MG capsule Take 20 mg by mouth every morning.    Yes Historical Provider, MD  pravastatin (PRAVACHOL) 40 MG tablet Take 40 mg by mouth every evening.    Yes Historical Provider, MD  scopolamine (TRANSDERM-SCOP) 1.5 MG Place 1 patch onto the skin every 3 (three) days.   Yes Historical Provider, MD  torsemide (DEMADEX) 20 MG tablet Take 20 mg by mouth daily.    Yes Historical Provider, MD  tuberculin (APLISOL) 5 UNIT/0.1ML injection Inject 0.1 mLs into the skin 2 (two) times a week. Give every Mon and Wed evening   Yes Historical Provider, MD   Allergies  Allergen Reactions  . Sulfonamide Derivatives Hives and Rash   FAMILY HISTORY:  Family History  Problem Relation Age of Onset  . Heart attack Father   . Stroke Mother     CVA  . Coronary artery disease Daughter    SOCIAL HISTORY:  reports that she quit smoking about 5 months ago. Her smoking use included Cigarettes. She has a 55 pack-year smoking history. She has never used smokeless tobacco. She reports that she does not drink alcohol or use illicit drugs.  REVIEW OF SYSTEMS:  Unable to provide.  INTERVAL HISTORY:  VITAL SIGNS: Temp:  [97.5 F (36.4 C)-98.3 F (36.8 C)] 97.5 F (36.4 C) (12/24 0030) Pulse Rate:  [65-108] 69  (12/24 0030) Resp:  [10-35] 10  (12/24 0030) BP: (88-197)/(41-99) 88/41 mmHg (12/24 0030) SpO2:  [94 %-100 %] 100 % (12/24  0030) FiO2 (%):  [100 %] 100 % (12/23 2345) Weight:  [108.863 kg (240 lb)] 108.863 kg (240 lb) (12/24 0013)  HEMODYNAMICS:   VENTILATOR SETTINGS: Vent Mode:  [-] PRVC FiO2 (%):  [100 %] 100 % Set Rate:  [12 bmp] 12 bmp Vt Set:  [500 mL] 500 mL PEEP:  [5 cmH20] 5 cmH20 Plateau Pressure:  [25 cmH20] 25 cmH20  INTAKE / OUTPUT: Intake/Output    None    PHYSICAL EXAMINATION: General:  Appears acutely ill, mechanically ventilated, synchronous Neuro:  Encephalopathic, nonfocal, cough / gag diminished HEENT:  PERRL, OETT / OGT Cardiovascular:  RRR, no m/r/g Lungs:  Bilateral diminished air entry, rales, rhonchi Abdomen:  Soft, nontender, bowel sounds diminished Musculoskeletal:  Significant bilateral LE edema Skin:  Intact  LABS:  Lab 02/15/12 0022 02/14/12  2130 02/14/12 2127  HGB -- 8.8* --  WBC -- 8.3 --  PLT -- 184 --  NA -- 137 --  K -- 5.3* --  CL -- 102 --  CO2 -- 30 --  GLUCOSE -- 110* --  BUN -- 38* --  CREATININE -- 1.96* --  CALCIUM -- 9.6 --  MG -- -- --  PHOS -- -- --  AST -- -- --  ALT -- -- --  ALKPHOS -- -- --  BILITOT -- -- --  PROT -- -- --  ALBUMIN -- -- --  APTT -- -- --  INR -- -- --  LATICACIDVEN -- -- --  TROPONINI -- -- --  PROCALCITON -- -- --  PROBNP -- -- 10254.0*  O2SATVEN -- -- --  PHART 7.219* -- --  PCO2ART 72.7* -- --  PO2ART 274.0* -- --   No results found for this basename: GLUCAP:5 in the last 168 hours  IMAGING: 12/23  PCXR >>> R mainstem intubation, hyporinflated L lung, stable LLL opacity, interstitial infiltrates  ECG: 12/23 >>> SR, nonspecific ST changes  DIAGNOSES: Active Problems:  Type 2 diabetes mellitus with vascular disease  Sleep apnea  C O P D  Acute respiratory failure with hypercapnia  Acute on chronic renal failure  Anemia  Acute on chronic systolic heart failure  Chronic respiratory failure  Tracheostomy complication  Acute encephalopathy  Cardiac arrest  HCAP (healthcare-associated  pneumonia)  ASSESSMENT / PLAN:  PULMONARY  A:  Acute on chronic respiratory failure.  COPD.  OSA.  Chronic LLL collapse.  H/o tracheostomy. R mainstem intubation.  HCAP vs acute pulmonary edema. P:   Retract ETT Gaol SpO2>92, pH>7.30 Full mechanical support Daily SBT Trend ABG / CXR Combivent  CARDIOVASCULAR  A: S/p cardiac arrest.  Acute on chronic systolic / diastolic congestive heart failure.  Hypotension.  Dyslipidemia. P:  Goal MAP>60 Hold Coreg, Hydralazine as hypotensive ASA, Zocor Trend Troponin, Lactate  RENAL  A:  Acute on chronic renal failure.  Mild hyperkalemia. P:  IVF@KVO   Hold Lasix as hypotensive May need CVL Trend BMP  GASTROINTESTINAL  A:  No active issues. P:   NPO as intubated TF if remains intubated > 24 hours  HEMATOLOGIC  A:  Anemia. P:  Trend CBC  INFECTIOUS  A:  Suspected HCAP. P:   Cultures and antibiotics as above PCT  ENDOCRINE   A:  DM.  Hyperglycemia. P:   ICU Glycemic Control Protocol, Phase I  NEUROLOGIC  A:  Acute encephalopathy, but minimal impairment post arrest. P:   Hypothermia not indicated (in-hospital, non VF/VT, minimal neurological impairment)  Goal RASS 0 to -1 Fentanyl / Versed gtt Lexapro  CLINICAL SUMMARY: 71 yo NH resident with COPD, OSA, recurrent lung collapse and previous percutaneous tracheostomy brought to [facility] for treatment of "pneumonia" who suffered cardiac arrest while awaiting evaluation, was intubated and resuscitated.  Full mechanical support.  Antibiotics.  Diuresis when able.  Would strongly suggest goals of care discussion - family is not available at this time.  I have personally obtained a history, examined the patient, evaluated laboratory and imaging results, formulated the assessment and plan and placed orders.  CRITICAL CARE:  The patient is critically ill with multiple organ systems failure and requires high complexity decision making for assessment and support, frequent  evaluation and titration of therapies, application of advanced monitoring technologies and extensive interpretation of multiple databases. Critical Care Time devoted to patient care services described in this note is 45 minutes.  Lonia Farber, MD  Pulmonary and Critical Care Medicine [facility] Pager: [phone]  02/15/2012, 12:38 AM

## 2012-02-15 NOTE — Progress Notes (Signed)
ANTIBIOTIC CONSULT NOTE - INITIAL  Pharmacy Consult for vancomycin, Zosyn Indication: rule out pneumonia  Allergies  Allergen Reactions  . Sulfonamide Derivatives Hives and Rash    Patient Measurements: Height: 5' 4.17" (163 cm) Weight: 240 lb (108.863 kg) IBW/kg (Calculated) : 55.1  Vital Signs: Temp: 97.5 F (36.4 C) (12/24 0030) BP: 88/41 mmHg (12/24 0030) Pulse Rate: 69  (12/24 0030) Intake/Output from previous day:   Intake/Output from this shift:    Labs:  [facility] 02/14/12 2130  WBC 8.3  HGB 8.8*  PLT 184  LABCREA --  CREATININE 1.96*   Estimated Creatinine Clearance: 31.4 ml/min (by C-G formula based on Cr of 1.96). No results found for this basename: VANCOTROUGH:2,VANCOPEAK:2,VANCORANDOM:2,GENTTROUGH:2,GENTPEAK:2,GENTRANDOM:2,TOBRATROUGH:2,TOBRAPEAK:2,TOBRARND:2,AMIKACINPEAK:2,AMIKACINTROU:2,AMIKACIN:2, in the last 72 hours   Microbiology: Recent Results (from the past 720 hour(s))  MRSA PCR SCREENING     Status: Normal   Collection Time   01/26/12  1:57 AM      Component Value Range Status Comment   MRSA by PCR NEGATIVE  NEGATIVE Final     Medical History: Past Medical History  Diagnosis Date  . COPD (chronic obstructive pulmonary disease)     on home o2  . Chronic renal insufficiency   . CVA (cerebral infarction)     hx  . Hypertensive heart disease with congestive heart failure   . Chronic cor pulmonale   . Cardiomyopathy of undetermined type 09/07/2011  . Chronic kidney disease (CKD), stage IV (severe)   . Hyperlipdemia   . Obesity (BMI 30-39.9)   . Sleep apnea   . History of gout 09/07/2011  . Heart failure 7/13  . Shortness of breath   . Anemia   . Vascular disease   . Asthma   . Anginal pain   . Hypertension   . Type II or unspecified type diabetes mellitus without mention of complication, not stated as uncontrolled   . Stroke   . GERD (gastroesophageal reflux disease)   . Arthritis   . Bundle branch block   . Thrombocytopenia   .  Acute and chronic respiratory failure     Medications:  Scheduled:    . albuterol-ipratropium  6 puff Inhalation Q4H  . antiseptic oral rinse  1 application Mouth Rinse QID  . aspirin  81 mg Oral Daily  . chlorhexidine  15 mL Mouth/Throat BID  . escitalopram  10 mg Oral Daily  . fentaNYL infusion INTRAVENOUS  25-400 mcg/hr Intravenous Once  . heparin subcutaneous  5,000 Units Subcutaneous Q8H  . pantoprazole (PROTONIX) IV  40 mg Intravenous Q24H  . simvastatin  20 mg Oral q1800  . vancomycin  1,000 mg Intravenous Once   Assessment: 71 yo female admitted with possible pneumonia. While waiting to see physicians she became apneic, pulseless and asystole on the monitor. CPR initiated. Patient was intubated. Pharmacy to manage vancomycin and Zosyn. Patient with orders per ED provider of Zosyn 3.375gm IV x 1 and vancomycin 1gm IV x 1.   Goal of Therapy:  Vancomycin trough level 15-20 mcg/ml  Plan:  1. Zosyn 3.375gm IV Q8H (4 hr infusion) 2. Vancomycin 1gm IV Q24H.   Emeline Gins 02/15/2012,12:55 AM

## 2012-02-15 NOTE — ED Provider Notes (Signed)
History     CSN: [id number]  Arrival date & time 02/14/12  2105   First MD Initiated Contact with Patient 02/14/12 2350      Chief Complaint  Patient presents with  . Pneumonia   Level V caveatcode blue (Consider location/radiation/quality/duration/timing/severity/associated sxs/prior treatment) HPI Patient sent here from nursing home reportedly aspirated tonight. While waiting to see physicians she became apneic and lost pulses. CPR initiated by nursing I was called to room. CPR in progress rhythm asystole. Patient intubated by [id number] pm  Past Medical History  Diagnosis Date  . COPD (chronic obstructive pulmonary disease)     on home o2  . Chronic renal insufficiency   . CVA (cerebral infarction)     hx  . Hypertensive heart disease with congestive heart failure   . Chronic cor pulmonale   . Cardiomyopathy of undetermined type 09/07/2011  . Chronic kidney disease (CKD), stage IV (severe)   . Hyperlipdemia   . Obesity (BMI 30-39.9)   . Sleep apnea   . History of gout 09/07/2011  . Heart failure 7/13  . Shortness of breath   . Anemia   . Vascular disease   . Asthma   . Anginal pain   . Hypertension   . Type II or unspecified type diabetes mellitus without mention of complication, not stated as uncontrolled   . Stroke   . GERD (gastroesophageal reflux disease)   . Arthritis   . Bundle branch block   . Thrombocytopenia   . Acute and chronic respiratory failure     Past Surgical History  Procedure Date  . Bilateral oophorectomy   . Cardiac catheterization     right heart cath  . Laparoscopic gastrostomy 12/21/2011    Procedure: LAPAROSCOPIC GASTROSTOMY;  Surgeon: Axel Filler, MD;  Location: [facility] OR;  Service: General;  Laterality: N/A;  laparoscopic gastrostomy possible open feeding tube  . Abdominal hysterectomy   . Tracheostomy 11/2011  . Cataracts     Family History  Problem Relation Age of Onset  . Heart attack Father   . Stroke Mother     CVA  .  Coronary artery disease Daughter     History  Substance Use Topics  . Smoking status: Former Smoker -- 1.0 packs/day for 55 years    Types: Cigarettes    Quit date: 08/28/2011  . Smokeless tobacco: Never Used  . Alcohol Use: No    OB History    Grav Para Term Preterm Abortions TAB SAB Ect Mult Living                  Review of Systems  Unable to perform ROS: Unstable vital signs    Allergies  Sulfonamide derivatives  Home Medications   Current Outpatient Rx  Name  Route  Sig  Dispense  Refill  . ALBUTEROL SULFATE (5 MG/ML) 0.5% IN NEBU   Nebulization   Take 2.5 mg by nebulization 3 (three) times daily.         . ALLOPURINOL 100 MG PO TABS   Oral   Take 100 mg by mouth daily.         . AMOXICILLIN-POT CLAVULANATE 875-125 MG PO TABS   Oral   Take 1 tablet by mouth 2 (two) times daily. Take for 7 days. Began regimen on 12/23 and should complete on 02/20/2012         . ASPIRIN 81 MG PO TABS   Oral   Take 81 mg by mouth daily.         Marland Kitchen  CARVEDILOL 12.5 MG PO TABS   Oral   Take 12.5 mg by mouth 2 (two) times daily with a meal. Hold for systolic blood pressure less than 110 and pulse less than 55         . VITAMIN D 2000 UNITS PO TABS   Oral   Take 2,000 Units by mouth daily.         Marland Kitchen ESCITALOPRAM OXALATE 10 MG PO TABS   Oral   Take 1 tablet (10 mg total) by mouth daily.         Marland Kitchen HYDRALAZINE HCL 25 MG PO TABS   Oral   Take 25 mg by mouth 3 (three) times daily.         . INSULIN ASPART 100 UNIT/ML  SOLN   Subcutaneous   Inject 5 Units into the skin 3 (three) times daily before meals. Inject per sliding scale:0-149=0 units, 150 or greater=5 units 15 minutes before or 30 minutes after a meal, every 6 hours         . IPRATROPIUM BROMIDE 0.02 % IN SOLN   Nebulization   Take 0.5 mg by nebulization 3 (three) times daily.         . IPRATROPIUM-ALBUTEROL 0.5-2.5 (3) MG/3ML IN SOLN   Nebulization   Take 3 mLs by nebulization every 6 (six)  hours as needed.         Marland Kitchen NITROGLYCERIN 0.4 MG SL SUBL   Sublingual   Place 0.4 mg under the tongue every 5 (five) minutes as needed. For chest pain         . OMEPRAZOLE 20 MG PO CPDR   Oral   Take 20 mg by mouth every morning.          Marland Kitchen PRAVASTATIN SODIUM 40 MG PO TABS   Oral   Take 40 mg by mouth every evening.          . SCOPOLAMINE BASE 1.5 MG TD PT72   Transdermal   Place 1 patch onto the skin every 3 (three) days.         . TORSEMIDE 20 MG PO TABS   Oral   Take 20 mg by mouth daily.          . TUBERCULIN PPD 5 UNIT/0.1ML ID SOLN   Intradermal   Inject 0.1 mLs into the skin 2 (two) times a week. Give every Mon and Wed evening           BP 180/72  Pulse 108  Temp 98.3 F (36.8 C)  Resp 16  Wt 240 lb (108.863 kg)  SpO2 98%  Physical Exam  Nursing note and vitals reviewed. Constitutional: She is oriented to person, place, and time. She appears well-developed.       Unresponsive Glasgow Coma Score 3  HENT:  Head: Normocephalic and atraumatic.  Eyes:       Conjunctiva pale  Neck: No tracheal deviation present. No thyromegaly present.  Cardiovascular: Normal rate and regular rhythm.   No murmur heard. Pulmonary/Chest:       She respirations assisted by bag-valve-mask, no spontaneous respirations  Abdominal: Soft. Bowel sounds are normal.       Obese  Musculoskeletal:       No spontaneous movement  Neurological: She is alert and oriented to person, place, and time. Coordination abnormal.       Unresponsive  Skin: Skin is warm and dry.  Psychiatric: She has a normal mood and affect.    ED Course  Procedures (including critical care time)  Labs Reviewed  PRO B NATRIURETIC PEPTIDE - Abnormal; Notable for the following:    Pro B Natriuretic peptide (BNP) 10254.0 (*)     All other components within normal limits  CBC WITH DIFFERENTIAL - Abnormal; Notable for the following:    RBC 3.52 (*)     Hemoglobin 8.8 (*)     HCT 29.4 (*)     MCH  25.0 (*)     MCHC 29.9 (*)     RDW 17.5 (*)     All other components within normal limits  BASIC METABOLIC PANEL - Abnormal; Notable for the following:    Potassium 5.3 (*)     Glucose, Bld 110 (*)     BUN 38 (*)     Creatinine, Ser 1.96 (*)     GFR calc non Af Amer 24 (*)     GFR calc Af Amer 28 (*)     All other components within normal limits  URINALYSIS, ROUTINE W REFLEX MICROSCOPIC - Abnormal; Notable for the following:    Protein, ur 100 (*)     All other components within normal limits  URINE MICROSCOPIC-ADD ON - Abnormal; Notable for the following:    Casts HYALINE CASTS (*)     All other components within normal limits  CULTURE, BLOOD (ROUTINE X 2)  CULTURE, BLOOD (ROUTINE X 2)  TROPONIN I  LACTIC ACID, PLASMA  POCT I-STAT 3, BLOOD GAS (G3+)   Dg Chest Portable 1 View  02/15/2012  *RADIOLOGY REPORT*  Clinical Data: Post intubation  PORTABLE CHEST - 1 VIEW  Comparison: 02/14/2012  Findings: Endotracheal tube preferentially intubates the right mainstem bronchus.  Withdrawal approximately 4 cm is suggested.  Enteric tube courses below the diaphragm.  Stable left lower lobe opacity.  Possible superimposed mild interstitial edema.  No pneumothorax  Stable cardiomegaly.  IMPRESSION: Endotracheal tube preferentially intubates the right mainstem bronchus.  Withdrawal approximately 4 cm is suggested.   Original Report Authenticated By: Charline Bills, M.D.    Dg Chest Portable 1 View  02/14/2012  *RADIOLOGY REPORT*  Clinical Data: Difficulty breathing, pneumonia  PORTABLE CHEST - 1 VIEW  Comparison: 01/29/2012  Findings: Stable left lower lobe opacity, atelectasis versus pneumonia, with associated moderate left pleural effusion.  No pneumothorax.  The heart is top normal in size.  IMPRESSION: Stable left lower lobe opacity, atelectasis versus pneumonia.  Associated moderate left pleural effusion, unchanged.   Original Report Authenticated By: Charline Bills, M.D.      No  diagnosis found.   INTUBATION Performed by: Doug Sou  Required items: required blood products, implants, devices, and special equipment available Patient identity confirmed: provided demographic data and hospital-assigned identification number Time out: Immediately prior to procedure a "time out" was called to verify the correct patient, procedure, equipment, support staff and site/side marked as required.  Indications: Respiratory failure   Intubation method: direct Laryngoscopy   Preoxygenation: BVM  Sedativesnone  Tube Size: 7.5 cuffed  Post-procedure assessment: chest rise and ETCO2 monitor Breath sounds: equal and absent over the epigastrium Tube secured with: ETT holder Chest x-ray interpreted by radiologist and me.  Chest x-ray findings: endotracheal tube right mainstem bronchus Patient tolerated the procedure well wET tube pulled back 4 cm after x-ray reviewed  After intubation and resuscitation patient began to gag at the ventilator she was placed on a propofol intravenous drip per protocol  X-ray reviewed by me   Date: 02/15/2012  Rate:   Rhythm: normal sinus  rhythm  QRS Axis: left  Intervals: normal  ST/T Wave abnormalities: nonspecific ST/T changes  Conduction Disutrbances:nonspecific intraventricular conduction delay  Narrative Interpretation:   Old EKG Reviewed: No significant change from 01/25/2012 interpreted by me Pulmonary critical care team consult for admission. Intensive his physician requested that I speak with cardiology and faxed EKG to make certain not STEMI. Dr. Nadara Eaton reviewed EKG and verified no STEMI  Results for orders placed during the hospital encounter of 02/14/12  PRO B NATRIURETIC PEPTIDE      Component Value Range   Pro B Natriuretic peptide (BNP) 10254.0 (*) 0 - 125 pg/mL  CBC WITH DIFFERENTIAL      Component Value Range   WBC 8.3  4.0 - 10.5 K/uL   RBC 3.52 (*) 3.87 - 5.11 MIL/uL   Hemoglobin 8.8 (*) 12.0 - 15.0 g/dL   HCT  16.1 (*) 09.6 - 46.0 %   MCV 83.5  78.0 - 100.0 fL   MCH 25.0 (*) 26.0 - 34.0 pg   MCHC 29.9 (*) 30.0 - 36.0 g/dL   RDW 04.5 (*) 40.9 - 81.1 %   Platelets 184  150 - 400 K/uL   Neutrophils Relative 66  43 - 77 %   Neutro Abs 5.5  1.7 - 7.7 K/uL   Lymphocytes Relative 24  12 - 46 %   Lymphs Abs 2.0  0.7 - 4.0 K/uL   Monocytes Relative 9  3 - 12 %   Monocytes Absolute 0.7  0.1 - 1.0 K/uL   Eosinophils Relative 2  0 - 5 %   Eosinophils Absolute 0.1  0.0 - 0.7 K/uL   Basophils Relative 0  0 - 1 %   Basophils Absolute 0.0  0.0 - 0.1 K/uL  BASIC METABOLIC PANEL      Component Value Range   Sodium 137  135 - 145 mEq/L   Potassium 5.3 (*) 3.5 - 5.1 mEq/L   Chloride 102  96 - 112 mEq/L   CO2 30  19 - 32 mEq/L   Glucose, Bld 110 (*) 70 - 99 mg/dL   BUN 38 (*) 6 - 23 mg/dL   Creatinine, Ser 9.14 (*) 0.50 - 1.10 mg/dL   Calcium 9.6  8.4 - 78.2 mg/dL   GFR calc non Af Amer 24 (*) >90 mL/min   GFR calc Af Amer 28 (*) >90 mL/min  URINALYSIS, ROUTINE W REFLEX MICROSCOPIC      Component Value Range   Color, Urine YELLOW  YELLOW   APPearance CLEAR  CLEAR   Specific Gravity, Urine 1.009  1.005 - 1.030   pH 5.5  5.0 - 8.0   Glucose, UA NEGATIVE  NEGATIVE mg/dL   Hgb urine dipstick NEGATIVE  NEGATIVE   Bilirubin Urine NEGATIVE  NEGATIVE   Ketones, ur NEGATIVE  NEGATIVE mg/dL   Protein, ur 956 (*) NEGATIVE mg/dL   Urobilinogen, UA 0.2  0.0 - 1.0 mg/dL   Nitrite NEGATIVE  NEGATIVE   Leukocytes, UA NEGATIVE  NEGATIVE  URINE MICROSCOPIC-ADD ON      Component Value Range   Squamous Epithelial / LPF RARE  RARE   WBC, UA 0-2  <3 WBC/hpf   RBC / HPF 0-2  <3 RBC/hpf   Bacteria, UA RARE  RARE   Casts HYALINE CASTS (*) NEGATIVE  POCT I-STAT 3, BLOOD GAS (G3+)      Component Value Range   pH, Arterial 7.219 (*) 7.350 - 7.450   pCO2 arterial 72.7 (*) 35.0 - 45.0 mmHg  pO2, Arterial 274.0 (*) 80.0 - 100.0 mmHg   Bicarbonate 29.7 (*) 20.0 - 24.0 mEq/L   TCO2 32  0 - 100 mmol/L   O2 Saturation  100.0     Acid-Base Excess 1.0  0.0 - 2.0 mmol/L   Patient temperature 98.6 F     Collection site RADIAL, ALLEN'S TEST ACCEPTABLE     Drawn by RT     Sample type ARTERIAL     Comment NOTIFIED PHYSICIAN     Dg Chest Portable 1 View  02/15/2012  *RADIOLOGY REPORT*  Clinical Data: Post intubation  PORTABLE CHEST - 1 VIEW  Comparison: 02/14/2012  Findings: Endotracheal tube preferentially intubates the right mainstem bronchus.  Withdrawal approximately 4 cm is suggested.  Enteric tube courses below the diaphragm.  Stable left lower lobe opacity.  Possible superimposed mild interstitial edema.  No pneumothorax  Stable cardiomegaly.  IMPRESSION: Endotracheal tube preferentially intubates the right mainstem bronchus.  Withdrawal approximately 4 cm is suggested.   Original Report Authenticated By: Charline Bills, M.D.    Dg Chest Portable 1 View  02/14/2012  *RADIOLOGY REPORT*  Clinical Data: Difficulty breathing, pneumonia  PORTABLE CHEST - 1 VIEW  Comparison: 01/29/2012  Findings: Stable left lower lobe opacity, atelectasis versus pneumonia, with associated moderate left pleural effusion.  No pneumothorax.  The heart is top normal in size.  IMPRESSION: Stable left lower lobe opacity, atelectasis versus pneumonia.  Associated moderate left pleural effusion, unchanged.   Original Report Authenticated By: Charline Bills, M.D.    Dg Chest Port 1 View  01/29/2012  *RADIOLOGY REPORT*  Clinical Data: Shortness of breath and atelectasis.  PORTABLE CHEST - 1 VIEW  Comparison: 01/27/2012  Findings: There are persistent densities and volume loss at the left lung base.  Findings are suggestive for volume loss with pleural fluid.  Slightly improved aeration in the left mid lung. Right lung remains clear.  The heart size is grossly stable.  IMPRESSION: Persistent densities at the left lung base.  Findings most likely represent a combination of volume loss and pleural fluid.  Airspace disease cannot be excluded in  this area.  Slightly improved aeration in the left mid lung.   Original Report Authenticated By: Richarda Overlie, M.D.    Dg Chest Port 1 View  01/27/2012  *RADIOLOGY REPORT*  Clinical Data: Short of breath.  Atelectasis.  PORTABLE CHEST - 1 VIEW  Comparison: 01/26/2012.  Findings:  Opacification of the inferior left hemithorax.  The appearance suggests either collapse / consolidation of the left lower lobe and / or left pleural effusion.  Small focus of airspace disease in the left midlung.  Chronic elevation of the left hemidiaphragm.  The right lung is unchanged.  Visualized cardiopericardial silhouette unchanged. Monitoring leads are projected over the chest.  IMPRESSION: No interval change. Collapse / consolidation of the left lung base with small focus of airspace disease in the left midlung.  Probable moderate left pleural effusion.   Original Report Authenticated By: Andreas Newport, M.D.    Dg Chest Port 1 View  01/26/2012  *RADIOLOGY REPORT*  Clinical Data: Atelectasis. Increased difficulty breathing.  PORTABLE CHEST - 1 VIEW  Comparison: CT scan dated 01/04/2012 and chest x-ray dated 01/03/2012  Findings: There is increased atelectasis and slight consolidation in the left lung base since the prior exams. New small left effusion.  There is chronic cardiomegaly.  Pulmonary vascularity is normal.  Slight atelectasis at the right lung base.  Extensive coronary artery calcification is noted.  Tracheostomy tube  has been removed since the prior exams.  IMPRESSION: Increased atelectasis at the left lung base with slight consolidation.  Small left effusion.   Original Report Authenticated By: Francene Boyers, M.D.     MDM  Plan IV fluids, antibiotics, blood cultures  Dx#1Cardiopulmansy arrest #2 anemia #3 renal insufficiency  CRITICAL CARE Performed by: Doug Sou   Total critical care time: 30 minute  Critical care time was exclusive of separately billable procedures and treating other  patients.  Critical care was necessary to treat or prevent imminent or life-threatening deterioration.  Critical care was time spent personally by me on the following activities: development of treatment plan with patient and/or surrogate as well as nursing, discussions with consultants, evaluation of patient's response to treatment, examination of patient, obtaining history from patient or surrogate, ordering and performing treatments and interventions, ordering and review of laboratory studies, ordering and review of radiographic studies, pulse oximetry and re-evaluation of patient's condition.      Doug Sou, MD 02/15/12 [phone]
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
## 2012-02-15 NOTE — ED Notes (Signed)
Pt's SOB markedly increased, on the way to notify Dr. Caprice Kluver when pt's monitor started alarming.  When I check on pt I noticed pt was asystole on monitor, pt had no pulse, CPR started at 2238.  Additional staff and Dr. Shela Commons notified at 2238.

## 2012-02-15 NOTE — Progress Notes (Signed)
PULMONARY  / CRITICAL CARE MEDICINE  Name: [name] MRN: [id number] DOB: Jun 25, 1940    LOS: 1  REFERRING MD:  EDP  CHIEF COMPLAINT:  Cardiac arrest  BRIEF PATIENT DESCRIPTION:   71 yo with COPD / OSA / was admitted 3 days after a discharge from SNF on 11/14/11 with hypercarbic respiratory failure And had issues of Recurrent intubatin due to recurrent left lung collapse / post intubation vocal cord edema . Underwent percutaneous tracheostomy placed 11/23/11 by Dr. Tyson Alias and discharged 11/25/11. After this she had an admission in mid nov 2013 for abd wall cellultitis. She was brought to [facility] on 12/3 after tracheostomy was dislodged in the nursing home. In ED: tracheostomy tube appears out of the tract with stoma completely closed.  12/05 Pt refused tracheostomy re-do  12/05 PEG tube dislodged>>not replaced.   She developed increased 'sleepiness' per daughter at the NH over last few days & was brought to [facility] 12/23 for treatment of "pneumonia"  But then  suffered asystolic cardiac arrest while awaiting evaluation, was intubated and resuscitated.  LINES / TUBES: OETT  12/24 >>> OGT  12/24 >>> Foley  12/24 >>>  CULTURES: 12/24  Blood >>> 12/24  Respiratory >>>  ANTIBIOTICS: Zosyn 12/24 >>> Vancomycin 12/24 >>> Levaquin 12/24 >>>  SIGNIFICANT EVENTS:  12/24  Brought to [facility] for treatment of "pneumonia", suffered cardiac arrest while awaiting evaluation, was intubated and resuscitated.  LEVEL OF CARE:  ICU  PRIMARY SERVICE:  PCCM  CONSULTANTS:    CODE STATUS:  Full  DIET:  NPO  DVT Px:  Heparin  GI Px:  Protonix   INTERVAL HISTORY: More responsive, no CP  VITAL SIGNS: Temp:  [97 F (36.1 C)-98.9 F (37.2 C)] 97 F (36.1 C) (12/24 1236) Pulse Rate:  [34-108] 68  (12/24 1116) Resp:  [10-35] 14  (12/24 1500) BP: (88-197)/(41-106) 153/71 mmHg (12/24 1500) SpO2:  [87 %-100 %] 98 % (12/24 1116) FiO2 (%):  [50 %-100 %] 50 % (12/24 1116) Weight:  [108.863 kg  (240 lb)] 108.863 kg (240 lb) (12/24 0013)  HEMODYNAMICS:   VENTILATOR SETTINGS: Vent Mode:  [-] PRVC FiO2 (%):  [50 %-100 %] 50 % Set Rate:  [12 bmp-18 bmp] 18 bmp Vt Set:  [500 mL] 500 mL PEEP:  [5 cmH20] 5 cmH20 Plateau Pressure:  [20 cmH20-25 cmH20] 21 cmH20  INTAKE / OUTPUT: Intake/Output      12/23 0701 - 12/24 0700 12/24 0701 - 12/25 0700   I.V. (mL/kg) 49 (0.5) 27.5 (0.3)   NG/GT  30   IV Piggyback  10   Total Intake(mL/kg) 49 (0.5) 67.5 (0.6)   Urine (mL/kg/hr)  760 (0.8)   Total Output  760   Net +49 -692.5         PHYSICAL EXAMINATION: General:  Appears acutely ill, mechanically ventilated, synchronous Neuro:  Follows commands, cough / gag diminished HEENT:  PERRL, OETT / OGT Cardiovascular:  RRR, no m/r/g Lungs:  Bilateral diminished air entry, rales, rhonchi Abdomen:  Soft, nontender, bowel sounds diminished Musculoskeletal:  Significant bilateral LE edema Skin:  Intact  LABS:  Lab 02/15/12 1425 02/15/12 1424 02/15/12 0856 02/15/12 0723 02/15/12 0423 02/15/12 0059 02/15/12 0050 02/15/12 0042 02/15/12 0022 02/14/12 2130 02/14/12 2127  HGB -- 9.4* -- -- -- -- -- -- -- 8.8* --  WBC -- 13.8* -- -- -- -- -- -- -- 8.3 --  PLT -- 136* -- -- -- -- -- -- -- 184 --  NA -- 136 -- -- -- -- -- -- --  137 --  K -- 5.1 -- -- -- -- -- -- -- 5.3* --  CL -- 103 -- -- -- -- -- -- -- 102 --  CO2 -- 24 -- -- -- -- -- -- -- 30 --  GLUCOSE -- 86 -- -- -- -- -- -- -- 110* --  BUN -- 37* -- -- -- -- -- -- -- 38* --  CREATININE -- 2.01* -- -- -- -- -- -- -- 1.96* --  CALCIUM -- 8.8 -- -- -- -- -- -- -- 9.6 --  MG -- -- -- -- -- -- 1.7 -- -- -- --  PHOS -- -- -- -- -- -- -- -- -- -- --  AST -- -- -- -- -- -- -- -- -- -- --  ALT -- -- -- -- -- -- -- -- -- -- --  ALKPHOS -- -- -- -- -- -- -- -- -- -- --  BILITOT -- -- -- -- -- -- -- -- -- -- --  PROT -- -- -- -- -- -- -- -- -- -- --  ALBUMIN -- -- -- -- -- -- -- -- -- -- --  APTT -- -- -- -- -- -- -- -- -- -- --  INR -- -- --  -- -- -- -- -- -- -- --  LATICACIDVEN -- 1.3 1.2 -- -- 1.5 -- -- -- -- --  TROPONINI <0.30 -- -- <0.30 -- -- <0.30 -- -- -- --  PROCALCITON -- -- -- -- -- -- -- <0.10 -- -- --  PROBNP -- -- -- -- -- -- -- -- -- -- 10254.0*  O2SATVEN -- -- -- -- -- -- -- -- -- -- --  PHART -- -- -- -- 7.402 -- -- -- 7.219* -- --  PCO2ART -- -- -- -- 44.1 -- -- -- 72.7* -- --  PO2ART -- -- -- -- 95.0 -- -- -- 274.0* -- --    Lab 02/15/12 1219 02/15/12 0813 02/15/12 0353 02/15/12 0152  GLUCAP 87 75 99 126*    IMAGING: 12/24  PCXR >>>  stable LLL opacity, interstitial infiltrates  ECG: 12/23 >>> SR, nonspecific ST changes  DIAGNOSES: Active Problems:  Type 2 diabetes mellitus with vascular disease  Sleep apnea  C 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  COPD.  OSA.  Chronic LLL collapse.  H/o tracheostomy. R mainstem intubation.  HCAP vs acute pulmonary edema. Severe OSA '10 - AHI 81/h, corrected by bipap 22/17 P:   Gaol SpO2>92, pH>7.30 Daily SBT Combivent Will need to rediscuss Tstomy with pt & family prior to extubation - she has refused this on last admit  CARDIOVASCULAR  A: S/p cardiac arrest.  Acute on chronic systolic / diastolic congestive heart failure.  Hypotension.  Dyslipidemia. P:  Resume Coreg, Hydralazine ASA, Zocor Trend Troponin, Lactate  RENAL  A:  Acute on chronic renal failure.  Mild hyperkalemia. P:  Hold Lasix as hypotensive May need CVL Trend BMP  GASTROINTESTINAL  A:  No active issues. P:   NPO as intubated TF if remains intubated > 24 hours  HEMATOLOGIC  A:  Anemia. P:  Trend CBC  INFECTIOUS  A:  Suspected HCAP. P:   Cultures and  antibiotics as  above PCT  ENDOCRINE   A:  DM.  Hyperglycemia. P:   ICU Glycemic Control Protocol, Phase I  NEUROLOGIC  A:  Acute encephalopathy post arrest. - resolving P:   Hypothermia not indicated (in-hospital, non VF/VT, minimal neurological impairment)   Fentanyl / gtt, Versed  prn Lexapro  GLOBAL - D/w daughter who would like mom 'to live longer' but pt refuses re-do Tstomy May need goals of care discussion after this life threatening event  I have personally obtained a history, examined the patient, evaluated laboratory and imaging results, formulated the assessment and plan and placed orders.  CRITICAL CARE:  The patient is critically ill with multiple organ systems failure and requires high complexity decision making for assessment and support, frequent evaluation and titration of therapies, application of advanced monitoring technologies and extensive interpretation of multiple databases. Critical Care Time devoted to patient care services described in this note is 45 minutes.   Oretha Milch., MD  Pulmonary and Critical Care Medicine [facility] Pager: [phone]  02/15/2012, 4:03 PM

## 2012-02-15 NOTE — ED Notes (Signed)
Pt's BP dropped in the 80s systolic while on diprivan.  Critical care MD Subelevitskiy notified.  Diprivan will be d/ced, pt will be placed on versed and fentanyl for sedation.

## 2012-02-15 NOTE — Progress Notes (Signed)
INITIAL NUTRITION ASSESSMENT  DOCUMENTATION CODES Per approved criteria  -Obesity Unspecified   INTERVENTION:  If unable to extubate patient within the next 24 hours, recommend initiate TF via OG tube with Jevity 1.2 at 15 ml/h, increase by 10 ml every 4 hours to goal rate of 35 ml/h with Prostat 30 ml QID to provide 1408 kcals (25 kcals/kg ideal weight), 107 gm protein (100% estimated needs), 680 ml free water daily.  NUTRITION DIAGNOSIS: Inadequate oral intake related to inability to eat as evidenced by NPO status.   Goal: Intake to provide 60-70% of estimated calorie needs (22-25 kcals/kg ideal body weight) and >/= 90% of estimated protein needs, based on ASPEN guidelines for permissive underfeeding in critically ill obese individuals.  Monitor:  TF initiation/tolerance, labs, weight trend, vent status.  Reason for Assessment: [name]  71 y.o. female  Admitting Dx: Cardiac Arrest  ASSESSMENT: Patient was brought to [facility] for treatment of pneumonia, suffered cardiac arrest while awaiting evaluation, was intubated and resuscitated.  Patient is currently intubated on ventilator support.  MV: 8.6 Temp:Temp (24hrs), Avg:97.9 F (36.6 C), Min:97.2 F (36.2 C), Max:98.9 F (37.2 C)   Height: Ht Readings from Last 1 Encounters:  02/15/12 5' 4.17" (1.63 m)    Weight: Wt Readings from Last 1 Encounters:  02/15/12 240 lb (108.863 kg)    Ideal Body Weight: 54.5 kg  % Ideal Body Weight: 200%  Wt Readings from Last 10 Encounters:  02/15/12 240 lb (108.863 kg)  01/31/12 176 lb 6.4 oz (80.015 kg)  01/01/12 164 lb 3.9 oz (74.5 kg)  11/25/11 188 lb 6.4 oz (85.458 kg)  10/07/11 219 lb 9.6 oz (99.61 kg)  09/16/11 246 lb 6 oz (111.755 kg)  09/16/11 246 lb 6 oz (111.755 kg)    Usual Body Weight: 176 lb (2 weeks ago); ? weight up with fluid retention; Significant bilateral LE edema noted.   % Usual Body Weight: 136%  BMI:  Body mass index is 40.97 kg/(m^2). class 3, extreme  obesity BMI (using usual weight):  30.1 class 1 obesity  Estimated Nutritional Needs: Kcal: 1760 Protein: 105-125 gm Fluid: 1.6-1.7 L  Skin: wound on abdomen; incision on neck  Diet Order: NPO  EDUCATION NEEDS: -Education not appropriate at this time   Intake/Output Summary (Last 24 hours) at 02/15/12 0838 Last data filed at 02/15/12 0800  Gross per 24 hour  Intake     72 ml  Output      0 ml  Net     72 ml    Last BM: unknown   Labs:   Lab 02/15/12 0050 02/14/12 2130  NA -- 137  K -- 5.3*  CL -- 102  CO2 -- 30  BUN -- 38*  CREATININE -- 1.96*  CALCIUM -- 9.6  MG 1.7 --  PHOS -- --  GLUCOSE -- 110*    CBG (last 3)   Basename 02/15/12 0813 02/15/12 0353 02/15/12 0152  GLUCAP 75 99 126*    Scheduled Meds:   . albuterol-ipratropium  6 puff Inhalation Q4H  . antiseptic oral rinse  1 application Mouth Rinse QID  . aspirin  81 mg Oral Daily  . chlorhexidine  15 mL Mouth/Throat BID  . escitalopram  10 mg Oral Daily  . heparin subcutaneous  5,000 Units Subcutaneous Q8H  . levofloxacin (LEVAQUIN) IV  750 mg Intravenous Q48H  . pantoprazole (PROTONIX) IV  40 mg Intravenous Q24H  . simvastatin  20 mg Oral q1800  . vancomycin  1,000  mg Intravenous Once    Continuous Infusions:   . fentaNYL infusion INTRAVENOUS    . midazolam (VERSED) infusion      Past Medical History  Diagnosis Date  . COPD (chronic obstructive pulmonary disease)     on home o2  . Chronic renal insufficiency   . CVA (cerebral infarction)     hx  . Hypertensive heart disease with congestive heart failure   . Chronic cor pulmonale   . Cardiomyopathy of undetermined type 09/07/2011  . Chronic kidney disease (CKD), stage IV (severe)   . Hyperlipdemia   . Obesity (BMI 30-39.9)   . Sleep apnea   . History of gout 09/07/2011  . Heart failure 7/13  . Shortness of breath   . Anemia   . Vascular disease   . Asthma   . Anginal pain   . Hypertension   . Type II or unspecified type  diabetes mellitus without mention of complication, not stated as uncontrolled   . Stroke   . GERD (gastroesophageal reflux disease)   . Arthritis   . Bundle branch block   . Thrombocytopenia   . Acute and chronic respiratory failure     Past Surgical History  Procedure Date  . Bilateral oophorectomy   . Cardiac catheterization     right heart cath  . Laparoscopic gastrostomy 12/21/2011    Procedure: LAPAROSCOPIC GASTROSTOMY;  Surgeon: Axel Filler, MD;  Location: [facility] OR;  Service: General;  Laterality: N/A;  laparoscopic gastrostomy possible open feeding tube  . Abdominal hysterectomy   . Tracheostomy 11/2011  . Cataracts     Joaquin Courts, RD, LDN, CNSC Pager# [phone] After Hours Pager# [phone]

## 2012-02-16 ENCOUNTER — Inpatient Hospital Stay (HOSPITAL_COMMUNITY): Payer: Medicare Other

## 2012-02-16 LAB — GLUCOSE, CAPILLARY
Glucose-Capillary: 80 mg/dL (ref 70–99)
Glucose-Capillary: 81 mg/dL (ref 70–99)
Glucose-Capillary: 81 mg/dL (ref 70–99)
Glucose-Capillary: 83 mg/dL (ref 70–99)
Glucose-Capillary: 90 mg/dL (ref 70–99)
Glucose-Capillary: 90 mg/dL (ref 70–99)

## 2012-02-16 LAB — CBC
MCH: 24.8 pg — ABNORMAL LOW (ref 26.0–34.0)
MCV: 79.2 fL (ref 78.0–100.0)
Platelets: 162 10*3/uL (ref 150–400)
RBC: 3.75 MIL/uL — ABNORMAL LOW (ref 3.87–5.11)
RDW: 17.7 % — ABNORMAL HIGH (ref 11.5–15.5)
WBC: 11.3 10*3/uL — ABNORMAL HIGH (ref 4.0–10.5)

## 2012-02-16 LAB — BASIC METABOLIC PANEL
CO2: 27 mEq/L (ref 19–32)
Calcium: 9.2 mg/dL (ref 8.4–10.5)
Creatinine, Ser: 2.18 mg/dL — ABNORMAL HIGH (ref 0.50–1.10)
GFR calc Af Amer: 25 mL/min — ABNORMAL LOW (ref >90)
Sodium: 140 mEq/L (ref 135–145)

## 2012-02-16 LAB — PROCALCITONIN: Procalcitonin: 0.26 ng/mL

## 2012-02-16 MED ORDER — MIDAZOLAM HCL 2 MG/2ML IJ SOLN
1.0000 mg | INTRAMUSCULAR | Status: DC | PRN
  Administered 2012-02-16 – 2012-02-19 (×12): 2 mg via INTRAVENOUS
  Filled 2012-02-16 (×13): qty 2

## 2012-02-16 MED ORDER — PIPERACILLIN-TAZOBACTAM 3.375 G IVPB
3.3750 g | Freq: Three times a day (TID) | INTRAVENOUS | Status: DC
  Administered 2012-02-16 – 2012-02-18 (×6): 3.375 g via INTRAVENOUS
  Filled 2012-02-16 (×8): qty 50

## 2012-02-16 MED ORDER — PANTOPRAZOLE SODIUM 40 MG PO PACK
40.0000 mg | PACK | Freq: Every day | ORAL | Status: DC
  Administered 2012-02-17 – 2012-02-19 (×3): 40 mg
  Filled 2012-02-16 (×3): qty 20

## 2012-02-16 MED ORDER — JEVITY 1.2 CAL PO LIQD
1000.0000 mL | ORAL | Status: DC
  Administered 2012-02-16: 1000 mL
  Filled 2012-02-16 (×3): qty 1000

## 2012-02-16 MED ORDER — VANCOMYCIN HCL 10 G IV SOLR
1500.0000 mg | Freq: Once | INTRAVENOUS | Status: AC
  Administered 2012-02-16: 1500 mg via INTRAVENOUS
  Filled 2012-02-16: qty 1500

## 2012-02-16 MED ORDER — SODIUM CHLORIDE 0.9 % IV SOLN
INTRAVENOUS | Status: DC
  Administered 2012-02-16 – 2012-02-18 (×2): via INTRAVENOUS

## 2012-02-16 MED ORDER — VANCOMYCIN HCL IN DEXTROSE 1-5 GM/200ML-% IV SOLN: 1000.0000 mg | INTRAVENOUS | Status: DC

## 2012-02-16 NOTE — Progress Notes (Signed)
ANTIBIOTIC CONSULT NOTE - FOLLOW UP  Pharmacy Consult for vancomycin, zosyn Indication: rule out pneumonia  Allergies  Allergen Reactions  . Sulfonamide Derivatives Hives and Rash    Patient Measurements: Height: 5' 4.17" (163 cm) Weight: 201 lb 1 oz (91.2 kg) IBW/kg (Calculated) : 55.1   Vital Signs: Temp: 98.5 F (36.9 C) (12/25 1237) Temp src: Oral (12/25 1237) BP: 151/54 mmHg (12/25 1200) Pulse Rate: 71  (12/25 1125) Intake/Output from previous day: 12/24 0701 - 12/25 0700 In: 102.5 [I.V.:62.5; NG/GT:30; IV Piggyback:10] Out: 1375 [Urine:1375] Intake/Output from this shift: Total I/O In: 10 [IV Piggyback:10] Out: 125 [Urine:125]  Labs:  [facility] 02/16/12 0351 02/15/12 1424 02/14/12 2130  WBC 11.3* 13.8* 8.3  HGB 9.3* 9.4* 8.8*  PLT 162 136* 184  LABCREA -- -- --  CREATININE 2.18* 2.01* 1.96*   Estimated Creatinine Clearance: 25.6 ml/min (by C-G formula based on Cr of 2.18). No results found for this basename: VANCOTROUGH:2,VANCOPEAK:2,VANCORANDOM:2,GENTTROUGH:2,GENTPEAK:2,GENTRANDOM:2,TOBRATROUGH:2,TOBRAPEAK:2,TOBRARND:2,AMIKACINPEAK:2,AMIKACINTROU:2,AMIKACIN:2, in the last 72 hours    Assessment: 71 yo female with suspected HCAP on zosyn/vancomycin and levaquin. SCr= 2.18 and CrCl ~35; WBC= 11.3 and currently afebrile.  Vancomycin was ordered this am but has not been documented as administered.  Last Zosyn administered as 0100 this am.   12/24 vanc> 12/24 zosyn> Levaquin 12/24>  12/24 blood x2 12/24 Respiratory   Goal of Therapy:  Vancomycin trough level 15-20 mcg/ml  Plan: -Zosyn 3.375 gm IV q8hr -Vancomycin 1500mg  IV followed by 1000mg  Iv q24hr -Will follow renal function, cultures and clinical progress  Harland German, Pharm D 02/16/2012 1:46 PM

## 2012-02-16 NOTE — Progress Notes (Signed)
PULMONARY  / CRITICAL CARE MEDICINE  Name: [name] MRN: [id number] DOB: [birth year]    LOS: 2  REFERRING MD:  EDP  CHIEF COMPLAINT:  Cardiac arrest  BRIEF PATIENT DESCRIPTION:   71 yo with COPD / OSA / was admitted 3 days after a discharge from SNF on 11/14/11 with hypercarbic respiratory failure And had issues of Recurrent intubatin due to recurrent left lung collapse / post intubation vocal cord edema . Underwent percutaneous tracheostomy placed 11/23/11 by Dr. Tyson Alias and discharged 11/25/11. After this she had an admission in mid nov 2013 for abd wall cellultitis. She was brought to [facility] on 12/3 after tracheostomy was dislodged in the nursing home. In ED: tracheostomy tube appears out of the tract with stoma completely closed.  12/05 Pt refused tracheostomy re-do  12/05 PEG tube dislodged>>not replaced.   She developed increased 'sleepiness' per daughter at the NH over last few days & was brought to [facility] 12/23 for treatment of "pneumonia"  But then  suffered asystolic cardiac arrest while awaiting evaluation, was intubated and resuscitated.  LINES / TUBES: OETT  12/24 >>> OGT  12/24 >>> Foley  12/24 >>>  CULTURES: 12/24  Blood >>>ng 12/24  Respiratory >>>  ANTIBIOTICS: Zosyn 12/24 >>> Vancomycin 12/24 >>>12/25 Levaquin 12/24 >>>  SIGNIFICANT EVENTS:  12/24  Brought to [facility] for treatment of "pneumonia", suffered cardiac arrest while awaiting evaluation, was intubated and resuscitated.  INTERVAL HISTORY: More responsive, no CP Agitated intermittently , wants ETT out  VITAL SIGNS: Temp:  [97.1 F (36.2 C)-98.8 F (37.1 C)] 98.5 F (36.9 C) (12/25 1237) Pulse Rate:  [56-86] 71  (12/25 1600) Resp:  [15-27] 22  (12/25 1600) BP: (117-174)/(49-108) 145/54 mmHg (12/25 1600) SpO2:  [94 %-100 %] 100 % (12/25 1600) FiO2 (%):  [40 %] 40 % (12/25 1550) Weight:  [91.2 kg (201 lb 1 oz)] 91.2 kg (201 lb 1 oz) (12/25 0500)  HEMODYNAMICS:   VENTILATOR SETTINGS: Vent Mode:   [-] PRVC FiO2 (%):  [40 %] 40 % Set Rate:  [18 bmp] 18 bmp Vt Set:  [500 mL] 500 mL PEEP:  [5 cmH20] 5 cmH20 Pressure Support:  [5 cmH20-10 cmH20] 10 cmH20 Plateau Pressure:  [19 cmH20-20 cmH20] 19 cmH20  INTAKE / OUTPUT: Intake/Output      12/24 0701 - 12/25 0700 12/25 0701 - 12/26 0700   I.V. (mL/kg) 62.5 (0.7)    NG/GT 30    IV Piggyback 10 535   Total Intake(mL/kg) 102.5 (1.1) 535 (5.9)   Urine (mL/kg/hr) 1375 (0.6) 290 (0.3)   Total Output 1375 290   Net -1272.5 +245         PHYSICAL EXAMINATION: General:  Appears acutely ill, mechanically ventilated, synchronous Neuro:  Follows commands, cough / gag diminished HEENT:  PERRL, OETT / OGT Cardiovascular:  RRR, no m/r/g Lungs:  Bilateral diminished air entry, no rales, rhonchi Abdomen:  Soft, nontender, bowel sounds diminished Musculoskeletal:  Significant bilateral LE edema Skin:  Intact  LABS:  Lab 02/16/12 0351 02/15/12 1425 02/15/12 1424 02/15/12 0856 02/15/12 0723 02/15/12 0423 02/15/12 0059 02/15/12 0050 02/15/12 0042 02/15/12 0022 02/14/12 2130 02/14/12 2127  HGB 9.3* -- 9.4* -- -- -- -- -- -- -- 8.8* --  WBC 11.3* -- 13.8* -- -- -- -- -- -- -- 8.3 --  PLT 162 -- 136* -- -- -- -- -- -- -- 184 --  NA 140 -- 136 -- -- -- -- -- -- -- 137 --  K 4.9 -- 5.1 -- -- -- -- -- -- -- -- --  CL 104 -- 103 -- -- -- -- -- -- -- 102 --  CO2 27 -- 24 -- -- -- -- -- -- -- 30 --  GLUCOSE 93 -- 86 -- -- -- -- -- -- -- 110* --  BUN 39* -- 37* -- -- -- -- -- -- -- 38* --  CREATININE 2.18* -- 2.01* -- -- -- -- -- -- -- 1.96* --  CALCIUM 9.2 -- 8.8 -- -- -- -- -- -- -- 9.6 --  MG -- -- -- -- -- -- -- 1.7 -- -- -- --  PHOS -- -- -- -- -- -- -- -- -- -- -- --  AST -- -- -- -- -- -- -- -- -- -- -- --  ALT -- -- -- -- -- -- -- -- -- -- -- --  ALKPHOS -- -- -- -- -- -- -- -- -- -- -- --  BILITOT -- -- -- -- -- -- -- -- -- -- -- --  PROT -- -- -- -- -- -- -- -- -- -- -- --  ALBUMIN -- -- -- -- -- -- -- -- -- -- -- --  APTT -- -- -- --  -- -- -- -- -- -- -- --  INR -- -- -- -- -- -- -- -- -- -- -- --  LATICACIDVEN -- -- 1.3 1.2 -- -- 1.5 -- -- -- -- --  TROPONINI -- <0.30 -- -- <0.30 -- -- <0.30 -- -- -- --  PROCALCITON 0.26 -- -- -- -- -- -- -- <0.10 -- -- --  PROBNP -- -- -- -- -- -- -- -- -- -- -- 10254.0*  O2SATVEN -- -- -- -- -- -- -- -- -- -- -- --  PHART -- -- -- -- -- 7.402 -- -- -- 7.219* -- --  PCO2ART -- -- -- -- -- 44.1 -- -- -- 72.7* -- --  PO2ART -- -- -- -- -- 95.0 -- -- -- 274.0* -- --    Lab 02/16/12 1554 02/16/12 1210 02/16/12 0811 02/16/12 0426 02/16/12 0002  GLUCAP 81 90 81 83 90    IMAGING: 12/25  PCXR >>> Persistent dense retrocardiac airspace opacification   ECG: 12/23 >>> SR, nonspecific ST changes  DIAGNOSES: Active Problems:  Type 2 diabetes mellitus with vascular disease  Sleep apnea  C 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  COPD.  OSA.  Chronic LLL collapse.  H/o tracheostomy.  R mainstem intubation -corrected HCAP vs acute pulmonary edema. Severe OSA '10 - AHI 81/h, corrected by bipap 22/17 P:   Gaol SpO2>92, pH>7.30 Daily SBT Combivent Discuss redo Tstomy with pt & daughter [name] prior to extubation - she has refused this on last admit - they will discuss & get back to [place]  CARDIOVASCULAR  A: S/p cardiac arrest.  Acute on chronic systolic / diastolic congestive heart failure.  Hypotension.  Dyslipidemia. Echo- EF 45-50%, gr1 diast CHF P:  Resumed Coreg, Hydralazine Cannot give ACE-I or ARB Due to renal failure ASA, Zocor   RENAL  A:  Acute on chronic renal failure.  Mild hyperkalemia. P:  Hold Lasix as hypotensive Trend BMP  GASTROINTESTINAL  A:  No  active issues. P:   Start TFs  HEMATOLOGIC  A:  Anemia. P:  Trend CBC  INFECTIOUS  A:  Suspected HCAP- LLL. P:   Dc vanc, low PCT reassuring Simplify abx if remains low  ENDOCRINE   A:  DM.  Hyperglycemia. P:   ICU Glycemic Control Protocol, Phase I  NEUROLOGIC  A:  Acute encephalopathy post arrest. - resolving P:   Hypothermia not indicated (in-hospital, non VF/VT, minimal neurological impairment)   Fentanyl  gtt, Versed  prn Lexapro  GLOBAL - D/w daughter who would like mom 'to live longer' but pt refuses re-do Tstomy Would strongly recommend tstomy due to this life threatening event  I have personally obtained a history, examined the patient, evaluated laboratory and imaging results, formulated the assessment and plan and placed orders.  CRITICAL CARE:  The patient is critically ill with multiple organ systems failure and requires high complexity decision making for assessment and support, frequent evaluation and titration of therapies, application of advanced monitoring technologies and extensive interpretation of multiple databases. Critical Care Time devoted to patient care services described in this note is 35 minutes.   Oretha Milch., MD  Pulmonary and Critical Care Medicine [facility] Pager: [phone]  02/16/2012, 5:49 PM

## 2012-02-16 NOTE — Progress Notes (Signed)
Pharmacist Heart Failure Core Measure Documentation  Assessment: [name] has an EF documented as 45-50% on 01/28/12 by [name].  Rationale: Heart failure patients with left ventricular systolic dysfunction (LVSD) and an EF < 40% should be prescribed an angiotensin converting enzyme inhibitor (ACEI) or angiotensin receptor blocker (ARB) at discharge unless a contraindication is documented in the medical record.  This patient is not currently on an ACEI or ARB for HF.  This note is being placed in the record in order to provide documentation that a contraindication to the use of these agents is present for this encounter.  ACE Inhibitor or Angiotensin Receptor Blocker is contraindicated (specify all that apply)  []   ACEI allergy AND ARB allergy []   Angioedema []   Moderate or severe aortic stenosis [x]   Hyperkalemia []   Hypotension []   Renal artery stenosis []   Worsening renal function, preexisting renal disease or dysfunction   Laurence Slate 02/16/2012 2:57 PM

## 2012-02-17 ENCOUNTER — Inpatient Hospital Stay (HOSPITAL_COMMUNITY): Payer: Medicare Other

## 2012-02-17 LAB — BASIC METABOLIC PANEL
BUN: 37 mg/dL — ABNORMAL HIGH (ref 6–23)
CO2: 24 mEq/L (ref 19–32)
Chloride: 106 mEq/L (ref 96–112)
Chloride: 105 mEq/L (ref 96–112)
GFR calc Af Amer: 22 mL/min — ABNORMAL LOW (ref >90)
GFR calc non Af Amer: 19 mL/min — ABNORMAL LOW (ref >90)
Glucose, Bld: 114 mg/dL — ABNORMAL HIGH (ref 70–99)
Potassium: 4.5 mEq/L (ref 3.5–5.1)
Sodium: 140 mEq/L (ref 135–145)
Sodium: 139 mEq/L (ref 135–145)

## 2012-02-17 LAB — MAGNESIUM: Magnesium: 1.7 mg/dL (ref 1.5–2.5)

## 2012-02-17 LAB — GLUCOSE, CAPILLARY
Glucose-Capillary: 117 mg/dL — ABNORMAL HIGH (ref 70–99)
Glucose-Capillary: 84 mg/dL (ref 70–99)

## 2012-02-17 LAB — CBC
HCT: 24.1 % — ABNORMAL LOW (ref 36.0–46.0)
Hemoglobin: 7.8 g/dL — ABNORMAL LOW (ref 12.0–15.0)
MCHC: 32.4 g/dL (ref 30.0–36.0)
RBC: 3.08 MIL/uL — ABNORMAL LOW (ref 3.87–5.11)
WBC: 8.6 10*3/uL (ref 4.0–10.5)

## 2012-02-17 LAB — PROTIME-INR: Prothrombin Time: 14.9 seconds (ref 11.6–15.2)

## 2012-02-17 MED ORDER — JEVITY 1.2 CAL PO LIQD
1000.0000 mL | ORAL | Status: DC
  Administered 2012-02-17: 19:00:00
  Administered 2012-02-18: 1000 mL
  Filled 2012-02-17 (×3): qty 1000

## 2012-02-17 MED ORDER — ACETAMINOPHEN 160 MG/5ML PO SOLN
650.0000 mg | Freq: Four times a day (QID) | ORAL | Status: DC | PRN
  Administered 2012-02-17: 650 mg
  Filled 2012-02-17: qty 20.3

## 2012-02-17 MED ORDER — HYDRALAZINE HCL 25 MG PO TABS
25.0000 mg | ORAL_TABLET | Freq: Three times a day (TID) | ORAL | Status: DC
  Administered 2012-02-17 – 2012-02-19 (×6): 25 mg via ORAL
  Filled 2012-02-17 (×8): qty 1

## 2012-02-17 MED ORDER — PRO-STAT SUGAR FREE PO LIQD
30.0000 mL | Freq: Four times a day (QID) | ORAL | Status: DC
  Administered 2012-02-17 – 2012-02-19 (×6): 30 mL
  Filled 2012-02-17 (×11): qty 30

## 2012-02-17 MED ORDER — CARVEDILOL 3.125 MG PO TABS
3.1250 mg | ORAL_TABLET | Freq: Two times a day (BID) | ORAL | Status: DC
  Administered 2012-02-17 – 2012-02-19 (×4): 3.125 mg via ORAL
  Filled 2012-02-17 (×6): qty 1

## 2012-02-17 NOTE — Progress Notes (Signed)
NUTRITION FOLLOW UP Consult for TF Management  Intervention:    Provide Jevity 1.2 via OG tube at 35 ml/hr with 30 ml Prostat QID to provide:  1408 kcals (25 kcals/kg ideal weight), 107 gm protein (100% estimated needs), 680 ml free water daily.  Nutrition Dx:   Inadequate oral intake related to inability to eat as evidenced by NPO status.  Goal:   Intake to provide 60-70% of estimated calorie needs (22-25 kcals/kg ideal body weight) and >/= 90% of estimated protein needs, based on ASPEN guidelines for permissive underfeeding in critically ill obese individuals.  Monitor:   TF initiation/tolerance, labs, weight trend, vent status  Assessment:   Patient was brought to [facility] for treatment of pneumonia, suffered cardiac arrest while awaiting evaluation, was intubated and resuscitated. Patient is currently intubated on ventilator support MV:  10, Temp:  38.4.  Needs increased with increased temp and MV.  TF started and tolerated well.  Discussed with RN.    Jevity 1.2 at 40 ml/hr providing:  1152 kcal, 53 gm protein, 778 ml free water per day.  Height: Ht Readings from Last 1 Encounters:  02/15/12 5' 4.17" (1.63 m)    Weight Status:   Wt Readings from Last 1 Encounters:  02/17/12 200 lb 13.4 oz (91.1 kg)    Re-estimated needs:  Kcal: 1916 Protein: 105-125 gm Fluid: 1.6-1.7 L  Skin: Wound on abdomen incision on neck  Diet Order:  NPO   Intake/Output Summary (Last 24 hours) at 02/17/12 1226 Last data filed at 02/17/12 1000  Gross per 24 hour  Intake   1500 ml  Output    870 ml  Net    630 ml    Last BM:  Unknown per RN   Labs:   Lab 02/17/12 0440 02/16/12 0351 02/15/12 1424 02/15/12 0050  NA 140 140 136 --  K 4.5 4.9 5.1 --  CL 106 104 103 --  CO2 26 27 24  --  BUN 37* 39* 37* --  CREATININE 2.40* 2.18* 2.01* --  CALCIUM 8.6 9.2 8.8 --  MG -- -- -- 1.7  PHOS -- -- -- --  GLUCOSE 95 93 86 --    CBG (last 3)   Basename 02/17/12 0814 02/17/12 0407 02/17/12    GLUCAP 102* 103* 84    Scheduled Meds:   . albuterol-ipratropium  6 puff Inhalation Q4H  . antiseptic oral rinse  1 application Mouth Rinse QID  . aspirin  81 mg Oral Daily  . carvedilol  6.25 mg Oral BID WC  . chlorhexidine  15 mL Mouth/Throat BID  . escitalopram  10 mg Oral Daily  . feeding supplement (JEVITY 1.2 CAL)  1,000 mL Per Tube Q24H  . heparin subcutaneous  5,000 Units Subcutaneous Q8H  . levofloxacin (LEVAQUIN) IV  750 mg Intravenous Q48H  . pantoprazole sodium  40 mg Per Tube Daily  . piperacillin-tazobactam (ZOSYN)  IV  3.375 g Intravenous Q8H  . simvastatin  20 mg Oral q1800    Continuous Infusions:   . sodium chloride 10 mL/hr at 02/16/12 2145  . fentaNYL infusion INTRAVENOUS Stopped (02/16/12 0500)    Oran Rein, RD, LDN Clinical Inpatient Dietitian Pager:  [phone] Weekend and after hours pager:  [phone]

## 2012-02-17 NOTE — Progress Notes (Signed)
PULMONARY  / CRITICAL CARE MEDICINE  Name: [name] MRN: [id number] DOB: [birth year]    LOS: 3  REFERRING MD:  EDP  CHIEF COMPLAINT:  Cardiac arrest  BRIEF PATIENT DESCRIPTION:   71 yo with COPD / OSA / was admitted 3 days after a discharge from SNF on 11/14/11 with hypercarbic respiratory failure And had issues of Recurrent intubatin due to recurrent left lung collapse / post intubation vocal cord edema . Underwent percutaneous tracheostomy placed 11/23/11 by Dr. Tyson Alias and discharged 11/25/11. After this she had an admission in mid nov 2013 for abd wall cellultitis. She was brought to [facility] on 12/3 after tracheostomy was dislodged in the nursing home. In ED: tracheostomy tube appears out of the tract with stoma completely closed.  12/05 Pt refused tracheostomy re-do  12/05 PEG tube dislodged>>not replaced.   She developed increased 'sleepiness' per daughter at the NH over last few days & was brought to [facility] 12/23 for treatment of "pneumonia"  But then  suffered asystolic cardiac arrest while awaiting evaluation, was intubated and resuscitated.  LINES / TUBES: OETT  12/24 >>> OGT  12/24 >>> Foley  12/24 >>>  CULTURES: 12/24  Blood >>>ng 12/24  Respiratory >>>  ANTIBIOTICS: Zosyn 12/24 >>> Vancomycin 12/24 >>>12/25 Levaquin 12/24 >>>  SIGNIFICANT EVENTS:  12/24  Brought to [facility] for treatment of "pneumonia", suffered cardiac arrest while awaiting evaluation, was intubated and resuscitated.  INTERVAL HISTORY: Febrile overnight, no CP Agitated intermittently , wants ETT out Bradycardic this am on coreg, hypertensive overnight  VITAL SIGNS: Temp:  [99.3 F (37.4 C)-101.2 F (38.4 C)] 99.3 F (37.4 C) (12/26 0831) Pulse Rate:  [60-79] 66  (12/26 1300) Resp:  [18-26] 25  (12/26 1300) BP: (96-184)/(38-108) 139/68 mmHg (12/26 1300) SpO2:  [98 %-100 %] 100 % (12/26 1300) FiO2 (%):  [40 %-41.2 %] 40.8 % (12/26 1300) Weight:  [91.1 kg (200 lb 13.4 oz)] 91.1 kg (200 lb 13.4  oz) (12/26 0500)  HEMODYNAMICS:   VENTILATOR SETTINGS: Vent Mode:  [-] CPAP;PSV FiO2 (%):  [40 %-41.2 %] 40.8 % Set Rate:  [18 bmp] 18 bmp Vt Set:  [500 mL] 500 mL PEEP:  [4.7 cmH20-5 cmH20] 4.7 cmH20 Pressure Support:  [12 cmH20] 12 cmH20 Plateau Pressure:  [18 cmH20-23 cmH20] 18 cmH20  INTAKE / OUTPUT: Intake/Output      12/25 0701 - 12/26 0700 12/26 0701 - 12/27 0700   I.V. (mL/kg) 112.5 (1.2) 60 (0.7)   NG/GT 360 420   IV Piggyback 797.5    Total Intake(mL/kg) 1270 (13.9) 480 (5.3)   Urine (mL/kg/hr) 905 (0.4) 290 (0.5)   Total Output 905 290   Net +365 +190         PHYSICAL EXAMINATION: General:  Appears acutely ill, mechanically ventilated, synchronous Neuro:  Follows commands, cough / gag diminished HEENT:  PERRL, OETT / OGT Cardiovascular:  RRR, no m/r/g Lungs:  Bilateral diminished air entry, no rales, rhonchi Abdomen:  Soft, nontender, bowel sounds diminished Musculoskeletal:  Significant bilateral LE edema Skin:  Intact  LABS:  Lab 02/17/12 0440 02/16/12 0351 02/15/12 1425 02/15/12 1424 02/15/12 0856 02/15/12 0723 02/15/12 0423 02/15/12 0059 02/15/12 0050 02/15/12 0042 02/15/12 0022 02/14/12 2127  HGB 7.8* 9.3* -- 9.4* -- -- -- -- -- -- -- --  WBC 8.6 11.3* -- 13.8* -- -- -- -- -- -- -- --  PLT 135* 162 -- 136* -- -- -- -- -- -- -- --  NA 140 140 -- 136 -- -- -- -- -- -- -- --  K 4.5 4.9 -- -- -- -- -- -- -- -- -- --  CL 106 104 -- 103 -- -- -- -- -- -- -- --  CO2 26 27 -- 24 -- -- -- -- -- -- -- --  GLUCOSE 95 93 -- 86 -- -- -- -- -- -- -- --  BUN 37* 39* -- 37* -- -- -- -- -- -- -- --  CREATININE 2.40* 2.18* -- 2.01* -- -- -- -- -- -- -- --  CALCIUM 8.6 9.2 -- 8.8 -- -- -- -- -- -- -- --  MG -- -- -- -- -- -- -- -- 1.7 -- -- --  PHOS -- -- -- -- -- -- -- -- -- -- -- --  AST -- -- -- -- -- -- -- -- -- -- -- --  ALT -- -- -- -- -- -- -- -- -- -- -- --  ALKPHOS -- -- -- -- -- -- -- -- -- -- -- --  BILITOT -- -- -- -- -- -- -- -- -- -- -- --  PROT -- --  -- -- -- -- -- -- -- -- -- --  ALBUMIN -- -- -- -- -- -- -- -- -- -- -- --  APTT -- -- -- -- -- -- -- -- -- -- -- --  INR 1.19 -- -- -- -- -- -- -- -- -- -- --  LATICACIDVEN -- -- -- 1.3 1.2 -- -- 1.5 -- -- -- --  TROPONINI -- -- <0.30 -- -- <0.30 -- -- <0.30 -- -- --  PROCALCITON -- 0.26 -- -- -- -- -- -- -- <0.10 -- --  PROBNP -- -- -- -- -- -- -- -- -- -- -- 10254.0*  O2SATVEN -- -- -- -- -- -- -- -- -- -- -- --  PHART -- -- -- -- -- -- 7.402 -- -- -- 7.219* --  PCO2ART -- -- -- -- -- -- 44.1 -- -- -- 72.7* --  PO2ART -- -- -- -- -- -- 95.0 -- -- -- 274.0* --    Lab 02/17/12 1157 02/17/12 0814 02/17/12 0407 02/17/12 02/16/12 2015  GLUCAP 116* 102* 103* 84 80    IMAGING: 12/26  PCXR >>> Persistent dense retrocardiac airspace opacification ? atelectasis  ECG: 12/23 >>> SR, nonspecific ST changes  DIAGNOSES: Active Problems:  Type 2 diabetes mellitus with vascular disease  Sleep apnea  C 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  COPD.  OSA.  Chronic LLL collapse.  H/o tracheostomy.  R mainstem intubation -corrected HCAP vs acute pulmonary edema. Severe OSA '10 - AHI 81/h, corrected by bipap 22/17 P:   Gaol SpO2>92, pH>7.30 Daily SBT Combivent Discuss redo Tstomy with pt & daughter [name] - they are agreeable- have asked ENT to proceed   CARDIOVASCULAR  A: S/p cardiac arrest.  Acute on chronic systolic / diastolic congestive heart failure.  Hypotension.  Dyslipidemia. Echo- EF 45-50%, gr1 diast CHF P:  Resumed Coreg 6.25 - reduce dose if bradycardia persists, Hydralazine Cannot give ACE-I or ARB Due to renal failure ASA, Zocor   RENAL  A:  Acute on chronic  renal failure.  Mild hyperkalemia. P:  Hold Lasix as cr rising Trend BMP  GASTROINTESTINAL  A:  No active issues. P:   Start TFs  HEMATOLOGIC  A:  Anemia. P:  Trend CBC  INFECTIOUS  A:  Suspected HCAP- LLL. Fever ? Related to atelectasis P:   Dc vanc, low PCT reassuring Simplify abx if remains low  ENDOCRINE   A:  DM.  Hyperglycemia. P:   ICU Glycemic Control Protocol, Phase I  NEUROLOGIC  A:  Acute encephalopathy post arrest. - resolving P:   Fentanyl  gtt, Versed  prn Lexapro  GLOBAL - D/w daughter -Would strongly recommend tstomy due to this life threatening event Will likely need LTAC post trach  I have personally obtained a history, examined the patient, evaluated laboratory and imaging results, formulated the assessment and plan and placed orders.  CRITICAL CARE:  The patient is critically ill with multiple organ systems failure and requires high complexity decision making for assessment and support, frequent evaluation and titration of therapies, application of advanced monitoring technologies and extensive interpretation of multiple databases. Critical Care Time devoted to patient care services described in this note is 35 minutes.   Oretha Milch., MD  Pulmonary and Critical Care Medicine [facility] Pager: [phone]  02/17/2012, 1:42 PM

## 2012-02-17 NOTE — Progress Notes (Signed)
Clinical Social Work  Patient discussed during progression meeting. Per MD, patient to get trach. CSW went to room but no family present. CSW will continue to follow to assist with needs.  [place], [place] [phone]

## 2012-02-17 NOTE — Progress Notes (Signed)
eLink Physician-Brief Progress Note Patient Name: [name] DOB: 02-09-1941 MRN: [id number]  Date of Service  02/17/2012   HPI/Events of Note  Temp of 101.61F   eICU Interventions  Order for tylenol 325 mg q6 hours prn temp greater than 100.15F   Intervention Category Minor Interventions: Routine modifications to care plan (e.g. PRN medications for pain, fever)  Roanna Reaves 02/17/2012, 12:15 AM

## 2012-02-17 NOTE — Consult Note (Signed)
Reason for Consult:Trach consult Referring Physician: Adriana Reams Zubelevitskiy*  [name] is an 71 y.o. female.  HPI: Had trach in October, dislodged accidentally a few weeks ago, unable to replace it. Did OK until recent cardiopulminary event.  Past Medical History  Diagnosis Date  . COPD (chronic obstructive pulmonary disease)     on home o2  . Chronic renal insufficiency   . CVA (cerebral infarction)     hx  . Hypertensive heart disease with congestive heart failure   . Chronic cor pulmonale   . Cardiomyopathy of undetermined type 09/07/2011  . Chronic kidney disease (CKD), stage IV (severe)   . Hyperlipdemia   . Obesity (BMI 30-39.9)   . Sleep apnea   . History of gout 09/07/2011  . Heart failure 7/13  . Shortness of breath   . Anemia   . Vascular disease   . Asthma   . Anginal pain   . Hypertension   . Type II or unspecified type diabetes mellitus without mention of complication, not stated as uncontrolled   . Stroke   . GERD (gastroesophageal reflux disease)   . Arthritis   . Bundle branch block   . Thrombocytopenia   . Acute and chronic respiratory failure     Past Surgical History  Procedure Date  . Bilateral oophorectomy   . Cardiac catheterization     right heart cath  . Laparoscopic gastrostomy 12/21/2011    Procedure: LAPAROSCOPIC GASTROSTOMY;  Surgeon: Axel Filler, MD;  Location[facility] OR;  Service: General;  Laterality: N/A;  laparoscopic gastrostomy possible open feeding tube  . Abdominal hysterectomy   . Tracheostomy 11/2011  . Cataracts     Family History  Problem Relation Age of Onset  . Heart attack Father   . Stroke Mother     CVA  . Coronary artery disease Daughter     Social History:  reports that she quit smoking about 5 months ago. Her smoking use included Cigarettes. She has a 55 pack-year smoking history. She has never used smokeless tobacco. She reports that she does not drink alcohol or use illicit drugs.  Allergies:    Allergies  Allergen Reactions  . Sulfonamide Derivatives Hives and Rash    Medications: Reviewed  Results for orders placed during the hospital encounter of 02/14/12 (from the past 48 hour(s))  LACTIC ACID, PLASMA     Status: Normal   Collection Time   02/15/12  2:24 PM      Component Value Range Comment   Lactic Acid, Venous 1.3  0.5 - 2.2 mmol/L   BASIC METABOLIC PANEL     Status: Abnormal   Collection Time   02/15/12  2:24 PM      Component Value Range Comment   Sodium 136  135 - 145 mEq/L    Potassium 5.1  3.5 - 5.1 mEq/L    Chloride 103  96 - 112 mEq/L    CO2 24  19 - 32 mEq/L    Glucose, Bld 86  70 - 99 mg/dL    BUN 37 (*) 6 - 23 mg/dL    Creatinine, Ser 9.60 (*) 0.50 - 1.10 mg/dL    Calcium 8.8  8.4 - 45.4 mg/dL    GFR calc non Af Amer 24 (*) >90 mL/min    GFR calc Af Amer 27 (*) >90 mL/min   CBC     Status: Abnormal   Collection Time   02/15/12  2:24 PM      Component Value  Range Comment   WBC 13.8 (*) 4.0 - 10.5 K/uL    RBC 3.69 (*) 3.87 - 5.11 MIL/uL    Hemoglobin 9.4 (*) 12.0 - 15.0 g/dL    HCT 16.1 (*) 09.6 - 46.0 %    MCV 80.8  78.0 - 100.0 fL    MCH 25.5 (*) 26.0 - 34.0 pg    MCHC 31.5  30.0 - 36.0 g/dL    RDW 04.5 (*) 40.9 - 15.5 %    Platelets 136 (*) 150 - 400 K/uL   TROPONIN I     Status: Normal   Collection Time   02/15/12  2:25 PM      Component Value Range Comment   Troponin I <0.30  <0.30 ng/mL   GLUCOSE, CAPILLARY     Status: Normal   Collection Time   02/15/12  4:00 PM      Component Value Range Comment   Glucose-Capillary 84  70 - 99 mg/dL   GLUCOSE, CAPILLARY     Status: Normal   Collection Time   02/15/12  8:00 PM      Component Value Range Comment   Glucose-Capillary 81  70 - 99 mg/dL   GLUCOSE, CAPILLARY     Status: Normal   Collection Time   02/16/12 12:02 AM      Component Value Range Comment   Glucose-Capillary 90  70 - 99 mg/dL    Comment 1 Documented in Chart      Comment 2 Notify RN     PROCALCITONIN     Status: Normal    Collection Time   02/16/12  3:51 AM      Component Value Range Comment   Procalcitonin 0.26     CBC     Status: Abnormal   Collection Time   02/16/12  3:51 AM      Component Value Range Comment   WBC 11.3 (*) 4.0 - 10.5 K/uL    RBC 3.75 (*) 3.87 - 5.11 MIL/uL    Hemoglobin 9.3 (*) 12.0 - 15.0 g/dL    HCT 81.1 (*) 91.4 - 46.0 %    MCV 79.2  78.0 - 100.0 fL    MCH 24.8 (*) 26.0 - 34.0 pg    MCHC 31.3  30.0 - 36.0 g/dL    RDW 78.2 (*) 95.6 - 15.5 %    Platelets 162  150 - 400 K/uL   BASIC METABOLIC PANEL     Status: Abnormal   Collection Time   02/16/12  3:51 AM      Component Value Range Comment   Sodium 140  135 - 145 mEq/L    Potassium 4.9  3.5 - 5.1 mEq/L    Chloride 104  96 - 112 mEq/L    CO2 27  19 - 32 mEq/L    Glucose, Bld 93  70 - 99 mg/dL    BUN 39 (*) 6 - 23 mg/dL    Creatinine, Ser 2.13 (*) 0.50 - 1.10 mg/dL    Calcium 9.2  8.4 - 08.6 mg/dL    GFR calc non Af Amer 21 (*) >90 mL/min    GFR calc Af Amer 25 (*) >90 mL/min   GLUCOSE, CAPILLARY     Status: Normal   Collection Time   02/16/12  4:26 AM      Component Value Range Comment   Glucose-Capillary 83  70 - 99 mg/dL    Comment 1 Documented in Chart      Comment 2 Notify  RN     GLUCOSE, CAPILLARY     Status: Normal   Collection Time   02/16/12  8:11 AM      Component Value Range Comment   Glucose-Capillary 81  70 - 99 mg/dL   GLUCOSE, CAPILLARY     Status: Normal   Collection Time   02/16/12 12:10 PM      Component Value Range Comment   Glucose-Capillary 90  70 - 99 mg/dL   GLUCOSE, CAPILLARY     Status: Normal   Collection Time   02/16/12  3:54 PM      Component Value Range Comment   Glucose-Capillary 81  70 - 99 mg/dL   GLUCOSE, CAPILLARY     Status: Normal   Collection Time   02/16/12  8:15 PM      Component Value Range Comment   Glucose-Capillary 80  70 - 99 mg/dL   GLUCOSE, CAPILLARY     Status: Normal   Collection Time   02/17/12 12:00 AM      Component Value Range Comment    Glucose-Capillary 84  70 - 99 mg/dL    Comment 1 Documented in Chart      Comment 2 Notify RN     GLUCOSE, CAPILLARY     Status: Abnormal   Collection Time   02/17/12  4:07 AM      Component Value Range Comment   Glucose-Capillary 103 (*) 70 - 99 mg/dL    Comment 1 Documented in Chart      Comment 2 Notify RN     BASIC METABOLIC PANEL     Status: Abnormal   Collection Time   02/17/12  4:40 AM      Component Value Range Comment   Sodium 140  135 - 145 mEq/L    Potassium 4.5  3.5 - 5.1 mEq/L    Chloride 106  96 - 112 mEq/L    CO2 26  19 - 32 mEq/L    Glucose, Bld 95  70 - 99 mg/dL    BUN 37 (*) 6 - 23 mg/dL    Creatinine, Ser 0.86 (*) 0.50 - 1.10 mg/dL    Calcium 8.6  8.4 - 57.8 mg/dL    GFR calc non Af Amer 19 (*) >90 mL/min    GFR calc Af Amer 22 (*) >90 mL/min   CBC     Status: Abnormal   Collection Time   02/17/12  4:40 AM      Component Value Range Comment   WBC 8.6  4.0 - 10.5 K/uL    RBC 3.08 (*) 3.87 - 5.11 MIL/uL    Hemoglobin 7.8 (*) 12.0 - 15.0 g/dL    HCT 46.9 (*) 62.9 - 46.0 %    MCV 78.2  78.0 - 100.0 fL    MCH 25.3 (*) 26.0 - 34.0 pg    MCHC 32.4  30.0 - 36.0 g/dL    RDW 52.8 (*) 41.3 - 15.5 %    Platelets 135 (*) 150 - 400 K/uL   PROTIME-INR     Status: Normal   Collection Time   02/17/12  4:40 AM      Component Value Range Comment   Prothrombin Time 14.9  11.6 - 15.2 seconds    INR 1.19  0.00 - 1.49   GLUCOSE, CAPILLARY     Status: Abnormal   Collection Time   02/17/12  8:14 AM      Component Value Range Comment   Glucose-Capillary 102 (*)  70 - 99 mg/dL     Dg Chest Port 1 View  02/17/2012  *RADIOLOGY REPORT*  Clinical Data: Left lower lobe pneumonia  PORTABLE CHEST - 1 VIEW  Comparison: 02/16/2012; 02/15/2012; 02/14/2012; 12/20/2011; chest CT - 01/04/2012  Findings: Grossly unchanged enlarged cardiac silhouette and mediastinal contours.  Stable position of support apparatus.  No pneumothorax. Grossly unchanged chronic left basilar/retrocardiac  consolidative opacities.  Grossly unchanged right infrahilar heterogeneous opacities.  No new focal airspace opacities.  Mild cephalization of flow without frank evidence of pulmonary edema. No definite pleural effusion.  Unchanged bones.  IMPRESSION: 1.  Stable positioning of support apparatus.  No pneumothorax. 2.  Grossly unchanged with bibasilar opacities, left greater than right, atelectasis versus infiltrate.   Original Report Authenticated By: Tacey Ruiz, MD    Dg Chest Port 1 View  02/16/2012  *RADIOLOGY REPORT*  Clinical Data: Endotracheal tube repositioning.  PORTABLE CHEST - 1 VIEW  Comparison: Chest radiograph performed 02/15/2012  Findings: The the patient's endotracheal tube is seen ending 2-3 cm above the carina.  An enteric tube is noted extending below the diaphragm.  Lung expansion is mildly improved.  There is persistent dense retrocardiac airspace opacification, reflecting a small to moderate left-sided pleural effusion and associated airspace opacification. The right lung appears grossly clear.  No pneumothorax is seen.  The cardiomediastinal silhouette is borderline enlarged.  No acute osseous abnormalities are identified.  IMPRESSION: Persistent dense retrocardiac airspace opacification, reflecting a small to moderate left-sided pleural effusion and associated opacity.  Borderline cardiomegaly.   Original Report Authenticated By: Tonia Ghent, M.D.     ZOX:[id number] except as listed in admit H&P  Blood pressure 122/70, pulse 67, temperature 99.3 F (37.4 C), temperature source Oral, resp. rate 26, height 5' 4.17" (1.63 m), weight 200 lb 13.4 oz (91.1 kg), SpO2 100.00%.  PHYSICAL EXAM: Overall appearance:  Obese lady, orally intubated but awake and alert. Head:  Normocephalic, atraumatic. Ears: Deferred Nose: External nose is healthy in appearance. Internal nasal exam free of any lesions or obstruction. Oral Cavity:  There are no mucosal lesions or masses identified. ETT in  place. Oral Pharynx/Hypopharynx/Larynx: no signs of any mucosal lesions or masses identified.  Neuro:  No identifiable neurologic deficits. Neck: No palpable neck masses. Trach site healed over.  Studies Reviewed: none  Procedures: none   Assessment/Plan: Chronic sleep apnea with hypoventilation and morbid obesity. Recommend proceed with tracheostomy - will try to schedule for tomorrow am.  Serena Colonel 02/17/2012, 12:24 PM

## 2012-02-18 ENCOUNTER — Encounter (HOSPITAL_COMMUNITY)
Admission: EM | Disposition: A | Discharge status: Nursing Home (Includes ICF) | Payer: Self-pay | Source: Home / Self Care | Attending: Pulmonary Disease

## 2012-02-18 ENCOUNTER — Inpatient Hospital Stay (HOSPITAL_COMMUNITY): Payer: Medicare Other | Admitting: Anesthesiology

## 2012-02-18 ENCOUNTER — Encounter (HOSPITAL_COMMUNITY): Payer: Self-pay | Admitting: Anesthesiology

## 2012-02-18 ENCOUNTER — Inpatient Hospital Stay (HOSPITAL_COMMUNITY): Payer: Medicare Other

## 2012-02-18 HISTORY — PX: TRACHEOSTOMY TUBE PLACEMENT: SHX814

## 2012-02-18 LAB — BASIC METABOLIC PANEL
CO2: 24 mEq/L (ref 19–32)
Chloride: 104 mEq/L (ref 96–112)
Glucose, Bld: 79 mg/dL (ref 70–99)
Sodium: 139 mEq/L (ref 135–145)

## 2012-02-18 LAB — GLUCOSE, CAPILLARY
Glucose-Capillary: 91 mg/dL (ref 70–99)
Glucose-Capillary: 95 mg/dL (ref 70–99)

## 2012-02-18 LAB — CBC
HCT: 24.6 % — ABNORMAL LOW (ref 36.0–46.0)
Hemoglobin: 7.7 g/dL — ABNORMAL LOW (ref 12.0–15.0)
MCH: 24.7 pg — ABNORMAL LOW (ref 26.0–34.0)
MCV: 78.8 fL (ref 78.0–100.0)
RBC: 3.12 MIL/uL — ABNORMAL LOW (ref 3.87–5.11)
WBC: 8.9 10*3/uL (ref 4.0–10.5)

## 2012-02-18 SURGERY — CREATION, TRACHEOSTOMY
Anesthesia: General | Site: Neck | Wound class: Clean Contaminated

## 2012-02-18 MED ORDER — 0.9 % SODIUM CHLORIDE (POUR BTL) OPTIME
TOPICAL | Status: DC | PRN
  Administered 2012-02-18: 1000 mL

## 2012-02-18 MED ORDER — LIDOCAINE-EPINEPHRINE 1 %-1:100000 IJ SOLN
INTRAMUSCULAR | Status: DC | PRN
  Administered 2012-02-18: 1 mL

## 2012-02-18 MED ORDER — PIPERACILLIN-TAZOBACTAM 3.375 G IVPB: 3.3750 g | Freq: Three times a day (TID) | INTRAVENOUS | Status: DC

## 2012-02-18 MED ORDER — FENTANYL CITRATE 0.05 MG/ML IJ SOLN
25.0000 ug | INTRAMUSCULAR | Status: DC | PRN
  Administered 2012-02-18: 25 ug via INTRAVENOUS
  Administered 2012-02-18: 50 ug via INTRAVENOUS
  Administered 2012-02-18: 75 ug via INTRAVENOUS
  Administered 2012-02-18: 50 ug via INTRAVENOUS
  Administered 2012-02-19: 100 ug via INTRAVENOUS
  Administered 2012-02-19: 75 ug via INTRAVENOUS
  Filled 2012-02-18 (×6): qty 2

## 2012-02-18 MED ORDER — FENTANYL CITRATE 0.05 MG/ML IJ SOLN
INTRAMUSCULAR | Status: DC | PRN
  Administered 2012-02-18 (×3): 50 ug via INTRAVENOUS

## 2012-02-18 MED ORDER — PROPOFOL 10 MG/ML IV BOLUS
INTRAVENOUS | Status: DC | PRN
  Administered 2012-02-18: 20 mg via INTRAVENOUS
  Administered 2012-02-18: 100 mg via INTRAVENOUS

## 2012-02-18 MED ORDER — VANCOMYCIN HCL IN DEXTROSE 1-5 GM/200ML-% IV SOLN
1000.0000 mg | INTRAVENOUS | Status: DC
  Filled 2012-02-18: qty 200

## 2012-02-18 MED ORDER — ROCURONIUM BROMIDE 100 MG/10ML IV SOLN
INTRAVENOUS | Status: DC | PRN
  Administered 2012-02-18: 50 mg via INTRAVENOUS

## 2012-02-18 MED ORDER — LIDOCAINE HCL (CARDIAC) 20 MG/ML IV SOLN
INTRAVENOUS | Status: DC | PRN
  Administered 2012-02-18: 40 mg via INTRAVENOUS

## 2012-02-18 MED ORDER — MIDAZOLAM HCL 5 MG/5ML IJ SOLN
INTRAMUSCULAR | Status: DC | PRN
  Administered 2012-02-18: 2 mg via INTRAVENOUS

## 2012-02-18 SURGICAL SUPPLY — 33 items
BENZOIN TINCTURE PRP APPL 2/3 (GAUZE/BANDAGES/DRESSINGS) IMPLANT
BLADE SURG 11 STRL SS (BLADE) IMPLANT
BLADE SURG 15 STRL LF DISP TIS (BLADE) ×1 IMPLANT
BLADE SURG 15 STRL SS (BLADE) ×1
BLADE SURG ROTATE 9660 (MISCELLANEOUS) IMPLANT
CANISTER SUCTION 2500CC (MISCELLANEOUS) ×2 IMPLANT
CLEANER TIP ELECTROSURG 2X2 (MISCELLANEOUS) ×2 IMPLANT
CLOTH BEACON ORANGE TIMEOUT ST (SAFETY) ×2 IMPLANT
COVER SURGICAL LIGHT HANDLE (MISCELLANEOUS) ×2 IMPLANT
DECANTER SPIKE VIAL GLASS SM (MISCELLANEOUS) ×2 IMPLANT
ELECT COATED BLADE 2.86 ST (ELECTRODE) ×2 IMPLANT
ELECT REM PT RETURN 9FT ADLT (ELECTROSURGICAL) ×2
ELECTRODE REM PT RTRN 9FT ADLT (ELECTROSURGICAL) ×1 IMPLANT
GAUZE SPONGE 4X4 16PLY XRAY LF (GAUZE/BANDAGES/DRESSINGS) ×2 IMPLANT
GLOVE ECLIPSE 7.5 STRL STRAW (GLOVE) ×2 IMPLANT
GOWN STRL NON-REIN LRG LVL3 (GOWN DISPOSABLE) ×4 IMPLANT
KIT BASIN OR (CUSTOM PROCEDURE TRAY) ×2 IMPLANT
KIT ROOM TURNOVER OR (KITS) ×2 IMPLANT
NEEDLE HYPO 25GX1X1/2 BEV (NEEDLE) IMPLANT
NS IRRIG 1000ML POUR BTL (IV SOLUTION) ×2 IMPLANT
PACK EENT II TURBAN DRAPE (CUSTOM PROCEDURE TRAY) ×2 IMPLANT
PAD ARMBOARD 7.5X6 YLW CONV (MISCELLANEOUS) ×4 IMPLANT
PENCIL FOOT CONTROL (ELECTRODE) ×2 IMPLANT
SUT CHROMIC 2 0 SH (SUTURE) ×2 IMPLANT
SUT ETHILON 3 0 PS 1 (SUTURE) ×2 IMPLANT
SUT SILK 4 0 TIE 10X30 (SUTURE) ×2 IMPLANT
SUT SILK 4 0 TIES 17X18 (SUTURE) ×2 IMPLANT
SYR 20CC LL (SYRINGE) ×2 IMPLANT
SYR CONTROL 10ML LL (SYRINGE) IMPLANT
TOWEL OR 17X24 6PK STRL BLUE (TOWEL DISPOSABLE) ×2 IMPLANT
TOWEL OR 17X26 10 PK STRL BLUE (TOWEL DISPOSABLE) ×2 IMPLANT
TUBE CONNECTING 12X1/4 (SUCTIONS) ×2 IMPLANT
WATER STERILE IRR 1000ML POUR (IV SOLUTION) ×2 IMPLANT

## 2012-02-18 NOTE — Progress Notes (Signed)
PULMONARY  / CRITICAL CARE MEDICINE  Name: [name] MRN: [id number] DOB: 12-04-1940    LOS: 4  REFERRING MD:  EDP  CHIEF COMPLAINT:  Cardiac arrest  BRIEF PATIENT DESCRIPTION:   71 yo with COPD / OSA / was admitted 3 days after a discharge from SNF on 11/14/11 with hypercarbic respiratory failure And had issues of Recurrent intubatin due to recurrent left lung collapse / post intubation vocal cord edema . Underwent percutaneous tracheostomy placed 11/23/11 by Dr. Tyson Alias and discharged 11/25/11. After this she had an admission in mid nov 2013 for abd wall cellultitis. She was brought to [facility] on 12/3 after tracheostomy was dislodged in the nursing home. In ED: tracheostomy tube appears out of the tract with stoma completely closed.  12/05 Pt refused tracheostomy re-do  12/05 PEG tube dislodged>>not replaced.   She developed increased 'sleepiness' per daughter at the NH over last few days & was brought to [facility] 12/23 for treatment of "pneumonia"  But then  suffered asystolic cardiac arrest while awaiting evaluation, was intubated and resuscitated.  LINES / TUBES: OETT  12/24 >>>12/27 Tstomy 12/27 >> NGT  12/24 >>> Foley  12/24 >>>  CULTURES: 12/24  Blood >>>ng 12/24  Respiratory >>>  ANTIBIOTICS: Zosyn 12/24 >>> Vancomycin 12/24 >>>12/25 Levaquin 12/24 >>>  SIGNIFICANT EVENTS:  12/24  Brought to [facility] for treatment of "pneumonia", suffered cardiac arrest while awaiting evaluation, was intubated and resuscitated.  INTERVAL HISTORY: Low grade fevers persist, no CP Bradycardic  on coreg - dose decreased, hypertensive overnight  VITAL SIGNS: Temp:  [99.9 F (37.7 C)-100.6 F (38.1 C)] 100.5 F (38.1 C) (12/27 0800) Pulse Rate:  [54-78] 74  (12/27 1000) Resp:  [17-30] 26  (12/27 1000) BP: (126-177)/(47-72) 177/47 mmHg (12/27 1000) SpO2:  [96 %-100 %] 98 % (12/27 1000) FiO2 (%):  [40 %-41.2 %] 40.3 % (12/27 1221) Weight:  [91.3 kg (201 lb 4.5 oz)] 91.3 kg (201 lb 4.5 oz)  (12/27 0500)  HEMODYNAMICS:   VENTILATOR SETTINGS: Vent Mode:  [-] PRVC FiO2 (%):  [40 %-41.2 %] 40.3 % Set Rate:  [18 bmp] 18 bmp Vt Set:  [500 mL] 500 mL PEEP:  [4.6 cmH20-5.3 cmH20] 4.8 cmH20 Pressure Support:  [12 cmH20] 12 cmH20 Plateau Pressure:  [21 cmH20-26 cmH20] 21 cmH20  INTAKE / OUTPUT: Intake/Output      12/26 0701 - 12/27 0700 12/27 0701 - 12/28 0700   I.V. (mL/kg) 340 (3.7) 20 (0.2)   NG/GT 975 0   IV Piggyback 112.5    Total Intake(mL/kg) 1427.5 (15.6) 20 (0.2)   Urine (mL/kg/hr) 990 (0.5) 125 (0.3)   Stool  1   Total Output 990 126   Net +437.5 -106         PHYSICAL EXAMINATION: General:  Appears acutely ill, mechanically ventilated, synchronous Neuro:  Follows commands, cough / gag diminished HEENT:  PERRL, OETT / OGT Cardiovascular:  RRR, no m/r/g Lungs:  Bilateral diminished air entry, no rales, rhonchi Abdomen:  Soft, nontender, bowel sounds diminished Musculoskeletal:  Significant bilateral LE edema Skin:  Intact  LABS:  Lab 02/18/12 0430 02/17/12 1659 02/17/12 0440 02/16/12 0351 02/15/12 1425 02/15/12 1424 02/15/12 0856 02/15/12 0723 02/15/12 0423 02/15/12 0059 02/15/12 0050 02/15/12 0042 02/15/12 0022 02/14/12 2127  HGB 7.7* -- 7.8* 9.3* -- -- -- -- -- -- -- -- -- --  WBC 8.9 -- 8.6 11.3* -- -- -- -- -- -- -- -- -- --  PLT 121* -- 135* 162 -- -- -- -- -- -- -- -- -- --  NA 139 139 140 -- -- -- -- -- -- -- -- -- -- --  K 4.7 4.8 -- -- -- -- -- -- -- -- -- -- -- --  CL 104 105 106 -- -- -- -- -- -- -- -- -- -- --  CO2 24 24 26  -- -- -- -- -- -- -- -- -- -- --  GLUCOSE 79 114* 95 -- -- -- -- -- -- -- -- -- -- --  BUN 39* 37* 37* -- -- -- -- -- -- -- -- -- -- --  CREATININE 2.41* 2.39* 2.40* -- -- -- -- -- -- -- -- -- -- --  CALCIUM 8.5 8.6 8.6 -- -- -- -- -- -- -- -- -- -- --  MG -- 1.7 -- -- -- -- -- -- -- -- 1.7 -- -- --  PHOS -- -- -- -- -- -- -- -- -- -- -- -- -- --  AST -- -- -- -- -- -- -- -- -- -- -- -- -- --  ALT -- -- -- -- -- -- --  -- -- -- -- -- -- --  ALKPHOS -- -- -- -- -- -- -- -- -- -- -- -- -- --  BILITOT -- -- -- -- -- -- -- -- -- -- -- -- -- --  PROT -- -- -- -- -- -- -- -- -- -- -- -- -- --  ALBUMIN -- -- -- -- -- -- -- -- -- -- -- -- -- --  APTT -- -- -- -- -- -- -- -- -- -- -- -- -- --  INR -- -- 1.19 -- -- -- -- -- -- -- -- -- -- --  LATICACIDVEN -- -- -- -- -- 1.3 1.2 -- -- 1.5 -- -- -- --  TROPONINI -- -- -- -- <0.30 -- -- <0.30 -- -- <0.30 -- -- --  PROCALCITON -- -- -- 0.26 -- -- -- -- -- -- -- <0.10 -- --  PROBNP -- -- -- -- -- -- -- -- -- -- -- -- -- 10254.0*  O2SATVEN -- -- -- -- -- -- -- -- -- -- -- -- -- --  PHART -- -- -- -- -- -- -- -- 7.402 -- -- -- 7.219* --  PCO2ART -- -- -- -- -- -- -- -- 44.1 -- -- -- 72.7* --  PO2ART -- -- -- -- -- -- -- -- 95.0 -- -- -- 274.0* --    Lab 02/18/12 0801 02/18/12 0437 02/18/12 0009 02/17/12 2026 02/17/12 1552  GLUCAP 97 91 113* 125* 117*    IMAGING: 12/27  PCXR >>> Persistent dense retrocardiac airspace opacification ? atelectasis  ECG: 12/23 >>> SR, nonspecific ST changes  DIAGNOSES: Active Problems:  Type 2 diabetes mellitus with vascular disease  Sleep apnea  C 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  COPD.  OSA.  Chronic LLL collapse.  H/o tracheostomy.  R mainstem intubation -corrected HCAP vs acute pulmonary edema. Severe OSA '10 - AHI 81/h, corrected by bipap 22/17 P:   Gaol SpO2>92, pH>7.30 Combivent -redo Tstomy  by ENT today- hope to progress to ATC rapidly   CARDIOVASCULAR  A: S/p cardiac arrest.  Acute on chronic systolic / diastolic congestive heart failure.  Hypotension.  Dyslipidemia. Echo- EF 45-50%, gr1 diast CHF P:   Resumed Coreg 3.125 - reduce dose if bradycardia persists, Hydralazine Cannot give ACE-I or ARB Due to renal failure ASA, Zocor   RENAL  A:  Acute on chronic renal failure- baseline ? 1.6.  Mild hyperkalemia. P:  Hold Lasix as cr rising Trend BMP  GASTROINTESTINAL  A:  No active issues. P:   Start TFs  HEMATOLOGIC  A:  Anemia. P:  Trend CBC  INFECTIOUS  A:  Suspected HCAP- LLL. Fever ? Related to atelectasis P:   Dc vanc, low PCT reassuring Dc zosyn, ct levaquin x 7ds  ENDOCRINE   A:  DM.  Hyperglycemia. P:   ICU Glycemic Control Protocol, Phase I  NEUROLOGIC  A:  Acute encephalopathy post arrest. - resolving P:   Fentanyl , Versed  prn Lexapro  GLOBAL - D/w daughter - LTAC referral post trach  I have personally obtained a history, examined the patient, evaluated laboratory and imaging results, formulated the assessment and plan and placed orders.  CRITICAL CARE:  The patient is critically ill with multiple organ systems failure and requires high complexity decision making for assessment and support, frequent evaluation and titration of therapies, application of advanced monitoring technologies and extensive interpretation of multiple databases. Critical Care Time devoted to patient care services described in this note is 35 minutes.   Oretha Milch., MD  Pulmonary and Critical Care Medicine [facility] Pager: [phone]  02/18/2012, 12:24 PM

## 2012-02-18 NOTE — Care Management Note (Signed)
    Page 1 of 1   02/18/2012     5:17:19 PM   CARE MANAGEMENT NOTE 02/18/2012  Patient:  [name], [name]   Account Number:  [id number]  Date Initiated:  02/15/2012  Documentation initiated by:  Avie Arenas  Subjective/Objective Assessment:   Post code. intubated.     Action/Plan:   PLAN DC TO LTAC ON 02/19/12.   Anticipated DC Date:  02/19/2012   Anticipated DC Plan:  LONG TERM ACUTE CARE (LTAC)  In-house referral  Clinical Social Worker      DC Planning Services  CM consult      Choice offered to / List presented to:             Status of service:  Completed, signed off Medicare Important Message given?   (If response is "NO", the following Medicare IM given date fields will be blank) Date Medicare IM given:   Date Additional Medicare IM given:    Discharge Disposition:  LONG TERM ACUTE CARE (LTAC)  Per UR Regulation:  Reviewed for med. necessity/level of care/duration of stay  If discussed at Long Length of Stay Meetings, dates discussed:    Comments:  ContactSunday Corn Daughter     [phone]                 [facility] Daughter     [phone]

## 2012-02-18 NOTE — Anesthesia Preprocedure Evaluation (Addendum)
Anesthesia Evaluation  Patient identified by MRN, date of birth, ID band Patient awake    Reviewed: Allergy & Precautions, H&P , NPO status , Patient's Chart, lab work & pertinent test results  History of Anesthesia Complications Negative for: history of anesthetic complications  Airway      Comment: ETT in situ Dental   Pulmonary asthma , sleep apnea , pneumonia -, COPD COPD inhaler, former smoker,  + rhonchi         Cardiovascular hypertension, Pt. on medications and Pt. on home beta blockers + angina + CAD, + Peripheral Vascular Disease and +CHF + dysrhythmias Rhythm:Regular Rate:Normal  12/13 ECHO: Systolic function was mildly reduced. The estimated ejection fraction was in the range of 45% to 50%. Diffuse hypokinesis. Mild AS    Neuro/Psych CVA    GI/Hepatic GERD-  Controlled,(+) Cirrhosis -       ,   Endo/Other  diabetes (glu 79), Well Controlled, Type 2, Oral Hypoglycemic AgentsMorbid obesity  Renal/GU Renal InsufficiencyRenal disease (creat 2.41, K+ 4.7)     Musculoskeletal   Abdominal (+) + obese,   Peds  Hematology  (+) Blood dyscrasia (hb 7.7), anemia ,   Anesthesia Other Findings intubated  Reproductive/Obstetrics                         Anesthesia Physical Anesthesia Plan  ASA: IV  Anesthesia Plan: General   Post-op Pain Management:    Induction: Intravenous  Airway Management Planned: Oral ETT and Tracheostomy  Additional Equipment:   Intra-op Plan:   Post-operative Plan: Post-operative intubation/ventilation  Informed Consent: I have reviewed the patients History and Physical, chart, labs and discussed the procedure including the risks, benefits and alternatives for the proposed anesthesia with the patient or authorized representative who has indicated his/her understanding and acceptance.     Plan Discussed with: CRNA, Anesthesiologist and Surgeon  Anesthesia  Plan Comments: (Plan routine monitors, GETA with existing ETT, post op ventilation Consent obtained by Dr. Pollyann Kennedy with family )       Anesthesia Quick Evaluation

## 2012-02-18 NOTE — Op Note (Signed)
02/18/2012 12:07 PM   PATIENT:  [name], 71 y.o. female  PRE-OPERATIVE DIAGNOSIS:  RESP FAILURE   POST-OPERATIVE DIAGNOSIS:  respiratory failure   PROCEDURE:  Procedure(s): TRACHEOSTOMY  SURGEON:  Surgeon(s): Susy Frizzle, MD  ASSISTANTS: none   ANESTHESIA:   general  EBL: Minimal   DRAINS: none   LOCAL MEDICATIONS USED:  NONE  COUNTS CORRECT:  YES  PROCEDURE DETAILS: Patient was taken to the operating room and placed on the operating table in the supine position. A shoulder roll was placed for positioning. The patient was previously orally intubated. The neck was prepped and draped in a standard fashion. A vertical incision was created just above the sternal notch using electrocautery, along the scar of the previous trach. The midline fascia was divided. The isthmus of the thyroid was exposed and  reflected infereriorly and the upper trachea was exposed. A tracheotomy was created between the third and fourth  tracheal rings in a horizontal fashion. A lower tracheal flap was created with scissors. The orotracheal tube was removed. The #8 Shiley tracheostomy tube was placed without difficulty and the cuff was inflated. The shield was secured to the neck using a Velcro straps and nylon suture. The patient was then transferred back to the intensive care unit in critical condition.  PLAN OF CARE: Transfer to ICU  PATIENT DISPOSITION:  ICU - hemodynamically stable.

## 2012-02-18 NOTE — Discharge Summary (Signed)
Physician Discharge Summary  Patient ID: [name] MRN: [id number] DOB/AGE: [birth year] 71 y.o.  Admit date: 02/14/2012 Discharge date: 02/19/2012    Discharge Diagnoses:  Active Problems:  Type 2 diabetes mellitus with vascular disease  Sleep apnea  C O P D  Acute respiratory failure with hypercapnia  Acute on chronic renal failure  Anemia  Acute on chronic systolic heart failure  Chronic respiratory failure  Tracheostomy complication  Acute encephalopathy  Cardiac arrest  HCAP (healthcare-associated pneumonia)    Brief Summary: [name] is a 71 y.o. y/o female with a PMH of COPD / OSA / was admitted 3 days after a discharge from SNF on 11/14/11 with hypercarbic respiratory failure and had issues of recurrent intubatin due to recurrent left lung collapse / post intubation vocal cord edema. Underwent percutaneous tracheostomy 11/23/11 by Dr. Tyson Alias and discharged 11/25/11. After this she had an admission in mid November 2013 for abd wall cellultitis. She was brought to [facility] on 12/3 from SNF after tracheostomy was dislodged in the nursing home. In ER, tracheostomy tube was dislodged and track was closed.  12/05 Pt refused tracheostomy re-do, PEG tube dislodged>>not replaced.  She developed increased 'sleepiness' per daughter at the NH & was brought to [facility] 12/23 for treatment of "pneumonia" but then suffered asystolic cardiac arrest while awaiting evaluation, was intubated and resuscitated.  Hospital course complicated by HCAP, decompensated CHF, hypotension, acute on chronic renal failure and post arrest encephalopathy.  She underwent re-do tracheostomy on 12/27.       LINES / TUBES:  OETT 12/24 >>>12/27  Tstomy 12/27 >>  NGT 12/24 >>>  Foley 12/24 >>>   CULTURES:  12/24 Blood >>> 12/24 Respiratory >>>   ANTIBIOTICS:  Zosyn 12/24 >>>  Vancomycin 12/24 >>>12/25  Levaquin 12/24 >>>   SIGNIFICANT EVENTS:  12/24 Brought to [facility] for treatment of  "pneumonia", suffered cardiac arrest while awaiting evaluation, was intubated and resuscitated.                                                                    Current active problem list Acute on Chronic respiratory failure/ tracheostomy dependence Severe OSA and OHS (was BIPAP dependent prior) AHI 81/h, corrected by bipap 22/17 HCAP vs edema-->favor edema/effusion  Hospital course as outlined above. She is s/p tracheostomy on 12/27. Currently treating her for potential PNA (NOS) w/ levaquin. Now working again on weaning. Believe that she should progress to ATC. At this point given history we will need to determine if she can be fully liberated from the vent OR will she need Nocturnal ventilation. Minimally given her history she should NEVER be de-cannulated.  Recommendation  Complete 7d total levaquin Continue weaning efforts in attempt to get her on ATC After off vent we should get AM ABGs to determine if she needs Nocturnal ventilation or can get by with only trach. Add back demadex.   S/p cardiac arrest. See above Acute on chronic systolic / diastolic congestive heart failure. Echo- EF 45-50%, gr1 diast CHF  Dyslipidemia.  Now hemodynamically stable.  P:  Resumed Coreg 3.125 - reduce dose if bradycardia persists, Hydralazine  Cannot give ACE-I or ARB Due to renal failure  ASA, Zocor  Acute on chronic renal failure- baseline ?  1.6.  We have been holding her Demadex since admit. This may be contributing to the creatine rise as she is hypertensive as well as volume overloaded.  Recent Labs  Basename 02/19/12 0440 02/18/12 0430 02/17/12 1659   CREATININE 2.44* 2.41* 2.39*  P:  Add back demadex Trend BMP May require nephrology consult if creatinine continues to rise.   Anemia. Mild thrombocytopenia   Hgb currently stable. No active evidence of bleeding.   Lab 02/19/12 0440 02/18/12 0430 02/17/12 0440  HGB 8.1* 7.7* 7.8*  P:  Trend CBC  DM. Hyperglycemia.  CBG (last 3)    Basename 02/19/12 0718 02/19/12 0405 02/19/12 0002  GLUCAP 109* 107* 93  P:  SSI  Acute encephalopathy post arrest. - resolving  P:  Fentanyl , Versed prn  Lexapro     Filed Vitals:   02/19/12 0500 02/19/12 0600 02/19/12 0700 02/19/12 0747  BP: 181/67 126/50 136/56   Pulse: 62 62 65   Temp:    98.7 F (37.1 C)  TempSrc:    Oral  Resp: 18 18 20    Height:      Weight: 89.6 kg (197 lb 8.5 oz)     SpO2: 100% 100% 100%    Discharge Exam: General: Appears acutely ill, mechanically ventilated, synchronous  Neuro: Follows commands, cough / gag diminished  HEENT: PERRL, OETT / OGT  Cardiovascular: RRR, no m/r/g  Lungs: Bilateral diminished air entry, no rales, rhonchi  Abdomen: Soft, nontender, bowel sounds diminished  Musculoskeletal: Significant bilateral LE edema  Skin: Intact Vent Mode:  [-] PRVC FiO2 (%):  [40 %-41 %] 40.7 % Set Rate:  [18 bmp] 18 bmp Vt Set:  [500 mL] 500 mL PEEP:  [4.8 cmH20-5 cmH20] 5 cmH20 Plateau Pressure:  [16 cmH20-23 cmH20] 23 cmH20 Discharge Labs  BMET  Lab 02/19/12 0440 02/18/12 0430 02/17/12 1659 02/17/12 0440 02/16/12 0351 02/15/12 0050  NA 140 139 139 140 140 --  K 4.1 4.7 -- -- -- --  CL 107 104 105 106 104 --  CO2 23 24 24 26 27  --  GLUCOSE 104* 79 114* 95 93 --  BUN 38* 39* 37* 37* 39* --  CREATININE 2.44* 2.41* 2.39* 2.40* 2.18* --  CALCIUM 8.6 8.5 8.6 8.6 9.2 --  MG -- -- 1.7 -- -- 1.7  PHOS -- -- -- -- -- --     CBC  Lab 02/19/12 0440 02/18/12 0430 02/17/12 0440  HGB 8.1* 7.7* 7.8*  HCT 25.5* 24.6* 24.1*  WBC 8.8 8.9 8.6  PLT 139* 121* 135*   Anti-Coagulation  Lab 02/17/12 0440  INR 1.19   Discharge Orders    Future Orders Please Complete By Expires   Diet - low sodium heart healthy      Increase activity slowly            Medication List     As of 02/19/2012  8:07 AM    START taking these medications         acetaminophen 160 MG/5ML solution   Commonly known as: TYLENOL   Place 20.3 mLs (650  mg total) into feeding tube every 6 (six) hours as needed for fever.      * albuterol-ipratropium 18-103 MCG/ACT inhaler   Commonly known as: COMBIVENT   Inhale 6 puffs into the lungs every 4 (four) hours.   Replaces: ipratropium-albuterol 0.5-2.5 (3) MG/3ML Soln      * albuterol-ipratropium 18-103 MCG/ACT inhaler   Commonly known as: COMBIVENT   Inhale 6  puffs into the lungs every 2 (two) hours as needed for wheezing or shortness of breath.      antiseptic oral rinse Liqd   15 mLs by Mouth Rinse route QID.      aspirin 81 MG chewable tablet   Chew 1 tablet (81 mg total) by mouth daily.   Replaces: aspirin 81 MG tablet      chlorhexidine 0.12 % solution   Commonly known as: PERIDEX   Use as directed 15 mLs in the mouth or throat 2 (two) times daily.      feeding supplement (JEVITY 1.2 CAL) Liqd   Place 1,000 mLs into feeding tube daily.      feeding supplement Liqd   Place 30 mLs into feeding tube 4 (four) times daily.      fentaNYL 0.05 MG/ML injection   Commonly known as: SUBLIMAZE   Inject 0.5-2 mLs (25-100 mcg total) into the vein every 2 (two) hours as needed for severe pain.      heparin 5000 UNIT/ML injection   Inject 1 mL (5,000 Units total) into the skin every 8 (eight) hours.      * hydrALAZINE 20 MG/ML injection   Commonly known as: APRESOLINE   Inject 0.5-2 mLs (10-40 mg total) into the vein every 4 (four) hours as needed (SBP 180 & above).      levofloxacin 750 MG/150ML Soln   Commonly known as: LEVAQUIN   Inject 150 mLs (750 mg total) into the vein every other day.      pantoprazole sodium 40 mg/20 mL Pack   Commonly known as: PROTONIX   Place 20 mLs (40 mg total) into feeding tube daily.      simvastatin 20 MG tablet   Commonly known as: ZOCOR   Take 1 tablet (20 mg total) by mouth daily at 6 PM.   Replaces: pravastatin 40 MG tablet      sodium chloride 0.9 % infusion   kvo     * Notice: This list has 3 medication(s) that are the same as other  medications prescribed for you. Read the directions carefully, and ask your doctor or other care provider to review them with you.    CHANGE how you take these medications         carvedilol 3.125 MG tablet   Commonly known as: COREG   Take 1 tablet (3.125 mg total) by mouth 2 (two) times daily with a meal.   What changed: - medication strength - dose - doctor's instructions      CONTINUE taking these medications         escitalopram 10 MG tablet   Commonly known as: LEXAPRO   Take 1 tablet (10 mg total) by mouth daily.      * hydrALAZINE 25 MG tablet   Commonly known as: APRESOLINE      torsemide 20 MG tablet   Commonly known as: DEMADEX     * Notice: This list has 1 medication(s) that are the same as other medications prescribed for you. Read the directions carefully, and ask your doctor or other care provider to review them with you.    STOP taking these medications         albuterol (5 MG/ML) 0.5% nebulizer solution   Commonly known as: PROVENTIL      allopurinol 100 MG tablet   Commonly known as: ZYLOPRIM      amoxicillin-clavulanate 875-125 MG per tablet   Commonly known as: AUGMENTIN  APLISOL 5 UNIT/0.1ML injection   Generic drug: tuberculin      aspirin 81 MG tablet   Replaced by: aspirin 81 MG chewable tablet      insulin aspart 100 UNIT/ML injection   Commonly known as: novoLOG      ipratropium 0.02 % nebulizer solution   Commonly known as: ATROVENT      ipratropium-albuterol 0.5-2.5 (3) MG/3ML Soln   Commonly known as: DUONEB   Replaced by: albuterol-ipratropium 18-103 MCG/ACT inhaler      nitroGLYCERIN 0.4 MG SL tablet   Commonly known as: NITROSTAT      omeprazole 20 MG capsule   Commonly known as: PRILOSEC      pravastatin 40 MG tablet   Commonly known as: PRAVACHOL   Replaced by: simvastatin 20 MG tablet      scopolamine 1.5 MG   Commonly known as: TRANSDERM-SCOP      Vitamin D 2000 UNITS tablet          Where to get your  medications       Information on where to get these meds is not yet available. Ask your nurse or doctor.         acetaminophen 160 MG/5ML solution   albuterol-ipratropium 18-103 MCG/ACT inhaler   antiseptic oral rinse Liqd   aspirin 81 MG chewable tablet   carvedilol 3.125 MG tablet   chlorhexidine 0.12 % solution   feeding supplement (JEVITY 1.2 CAL) Liqd   feeding supplement Liqd   fentaNYL 0.05 MG/ML injection   heparin 5000 UNIT/ML injection   hydrALAZINE 20 MG/ML injection   levofloxacin 750 MG/150ML Soln   pantoprazole sodium 40 mg/20 mL Pack   simvastatin 20 MG tablet   sodium chloride 0.9 % infusion            Disposition:  [facility]  Discharged Condition: [name] has met maximum benefit of inpatient care and is medically stable and cleared for discharge.  Patient is pending follow up as above.      Time spent on disposition:  Greater than 35 minutes.   Signed:   Coralyn Helling, MD

## 2012-02-18 NOTE — Progress Notes (Signed)
Clinical Social Work  Patient was discussed during progression meeting. Patient to get trach today. MD feels possible LTAC needed. CSW discussed case with CM who is aware of possible needs. CSW will continue to follow.  [place], [place] [phone]

## 2012-02-18 NOTE — Transfer of Care (Signed)
Immediate Anesthesia Transfer of Care Note  Patient: [name]  Procedure(s) Performed: Procedure(s) (LRB) with comments: TRACHEOSTOMY (N/A)  Patient Location: ICU  Anesthesia Type:General  Level of Consciousness: sedated  Airway & Oxygen Therapy: Patient placed on Ventilator (see vital sign flow sheet for setting)  Post-op Assessment: Post -op Vital signs reviewed and stable  Post vital signs: Reviewed and stable  Complications: No apparent anesthesia complications

## 2012-02-19 ENCOUNTER — Other Ambulatory Visit (HOSPITAL_COMMUNITY): Payer: Self-pay

## 2012-02-19 ENCOUNTER — Inpatient Hospital Stay
Admission: AD | Admit: 2012-02-19 | Discharge: 2012-03-17 | Disposition: A | Discharge status: Skilled Nursing Facility | Payer: Medicare Other | Source: Ambulatory Visit | Attending: Internal Medicine | Admitting: Internal Medicine

## 2012-02-19 DIAGNOSIS — I469 Cardiac arrest, cause unspecified: Secondary | ICD-10-CM

## 2012-02-19 DIAGNOSIS — I279 Pulmonary heart disease, unspecified: Secondary | ICD-10-CM

## 2012-02-19 DIAGNOSIS — I5023 Acute on chronic systolic (congestive) heart failure: Secondary | ICD-10-CM

## 2012-02-19 DIAGNOSIS — J9602 Acute respiratory failure with hypercapnia: Secondary | ICD-10-CM

## 2012-02-19 DIAGNOSIS — I5021 Acute systolic (congestive) heart failure: Secondary | ICD-10-CM

## 2012-02-19 DIAGNOSIS — L03311 Cellulitis of abdominal wall: Secondary | ICD-10-CM

## 2012-02-19 DIAGNOSIS — Z930 Tracheostomy status: Secondary | ICD-10-CM

## 2012-02-19 DIAGNOSIS — J9500 Unspecified tracheostomy complication: Secondary | ICD-10-CM

## 2012-02-19 DIAGNOSIS — N189 Chronic kidney disease, unspecified: Secondary | ICD-10-CM

## 2012-02-19 DIAGNOSIS — G9340 Encephalopathy, unspecified: Secondary | ICD-10-CM

## 2012-02-19 DIAGNOSIS — J9620 Acute and chronic respiratory failure, unspecified whether with hypoxia or hypercapnia: Secondary | ICD-10-CM

## 2012-02-19 DIAGNOSIS — E669 Obesity, unspecified: Secondary | ICD-10-CM

## 2012-02-19 LAB — APTT: aPTT: 40 seconds — ABNORMAL HIGH (ref 24–37)

## 2012-02-19 LAB — BLOOD GAS, ARTERIAL
Acid-Base Excess: 0.4 mmol/L (ref 0.0–2.0)
Bicarbonate: 24.7 mEq/L — ABNORMAL HIGH (ref 20.0–24.0)
FIO2: 0.4 %
O2 Saturation: 98 %
Patient temperature: 98.6
TCO2: 26 mmol/L (ref 0–100)
pO2, Arterial: 69.1 mmHg — ABNORMAL LOW (ref 80.0–100.0)

## 2012-02-19 LAB — PROTIME-INR: INR: 1.19 (ref 0.00–1.49)

## 2012-02-19 LAB — CBC
HCT: 25.5 % — ABNORMAL LOW (ref 36.0–46.0)
Hemoglobin: 8.1 g/dL — ABNORMAL LOW (ref 12.0–15.0)
MCV: 79.7 fL (ref 78.0–100.0)
RBC: 3.2 MIL/uL — ABNORMAL LOW (ref 3.87–5.11)
RDW: 18.1 % — ABNORMAL HIGH (ref 11.5–15.5)
WBC: 8.8 10*3/uL (ref 4.0–10.5)

## 2012-02-19 LAB — PROCALCITONIN: Procalcitonin: <0.1 ng/mL

## 2012-02-19 LAB — BASIC METABOLIC PANEL
CO2: 23 mEq/L (ref 19–32)
Chloride: 107 mEq/L (ref 96–112)
Creatinine, Ser: 2.44 mg/dL — ABNORMAL HIGH (ref 0.50–1.10)
Potassium: 4.1 mEq/L (ref 3.5–5.1)

## 2012-02-19 LAB — GLUCOSE, CAPILLARY
Glucose-Capillary: 107 mg/dL — ABNORMAL HIGH (ref 70–99)
Glucose-Capillary: 93 mg/dL (ref 70–99)

## 2012-02-19 MED ORDER — SIMVASTATIN 20 MG PO TABS: 20.0000 mg | ORAL_TABLET | Freq: Every day | ORAL | Status: DC

## 2012-02-19 MED ORDER — PANTOPRAZOLE SODIUM 40 MG PO PACK: 40.0000 mg | PACK | Freq: Every day | ORAL | Status: DC

## 2012-02-19 MED ORDER — BIOTENE DRY MOUTH MT LIQD: 1.0000 "application " | Freq: Four times a day (QID) | OROMUCOSAL | Status: DC

## 2012-02-19 MED ORDER — IPRATROPIUM-ALBUTEROL 18-103 MCG/ACT IN AERO: 6.0000 | INHALATION_SPRAY | RESPIRATORY_TRACT | Status: DC

## 2012-02-19 MED ORDER — CARVEDILOL 3.125 MG PO TABS: 3.1250 mg | ORAL_TABLET | Freq: Two times a day (BID) | ORAL | Status: DC

## 2012-02-19 MED ORDER — ACETAMINOPHEN 160 MG/5ML PO SOLN: 650.0000 mg | Freq: Four times a day (QID) | ORAL | Status: DC | PRN

## 2012-02-19 MED ORDER — HEPARIN SODIUM (PORCINE) 5000 UNIT/ML IJ SOLN: 5000.0000 [IU] | Freq: Three times a day (TID) | INTRAMUSCULAR | Status: DC

## 2012-02-19 MED ORDER — IPRATROPIUM-ALBUTEROL 18-103 MCG/ACT IN AERO: 6.0000 | INHALATION_SPRAY | RESPIRATORY_TRACT | Status: DC | PRN

## 2012-02-19 MED ORDER — PRO-STAT SUGAR FREE PO LIQD: 30.0000 mL | Freq: Four times a day (QID) | ORAL | Status: DC

## 2012-02-19 MED ORDER — LEVOFLOXACIN IN D5W 750 MG/150ML IV SOLN: 750.0000 mg | INTRAVENOUS | Status: AC

## 2012-02-19 MED ORDER — ASPIRIN 81 MG PO CHEW: 81.0000 mg | CHEWABLE_TABLET | Freq: Every day | ORAL | Status: AC

## 2012-02-19 MED ORDER — SODIUM CHLORIDE 0.9 % IV SOLN: INTRAVENOUS | Status: DC

## 2012-02-19 MED ORDER — FENTANYL CITRATE 0.05 MG/ML IJ SOLN: 25.0000 ug | INTRAMUSCULAR | Status: DC | PRN

## 2012-02-19 MED ORDER — JEVITY 1.2 CAL PO LIQD: 1000.0000 mL | ORAL | Status: DC

## 2012-02-19 MED ORDER — CHLORHEXIDINE GLUCONATE 0.12 % MT SOLN: 15.0000 mL | Freq: Two times a day (BID) | OROMUCOSAL | Status: DC

## 2012-02-19 MED ORDER — HYDRALAZINE HCL 20 MG/ML IJ SOLN: 10.0000 mg | INTRAMUSCULAR | Status: DC | PRN

## 2012-02-20 LAB — CBC WITH DIFFERENTIAL/PLATELET
Basophils Absolute: 0 10*3/uL (ref 0.0–0.1)
Eosinophils Absolute: 0.1 10*3/uL (ref 0.0–0.7)
Eosinophils Relative: 1 % (ref 0–5)
HCT: 24.5 % — ABNORMAL LOW (ref 36.0–46.0)
Lymphocytes Relative: 11 % — ABNORMAL LOW (ref 12–46)
MCH: 25.3 pg — ABNORMAL LOW (ref 26.0–34.0)
MCHC: 31.8 g/dL (ref 30.0–36.0)
MCV: 79.5 fL (ref 78.0–100.0)
Monocytes Absolute: 0.8 10*3/uL (ref 0.1–1.0)
RDW: 18.1 % — ABNORMAL HIGH (ref 11.5–15.5)

## 2012-02-20 LAB — HEPATIC FUNCTION PANEL
Alkaline Phosphatase: 71 U/L (ref 39–117)
Indirect Bilirubin: 0.3 mg/dL (ref 0.3–0.9)
Total Bilirubin: 0.5 mg/dL (ref 0.3–1.2)
Total Protein: 5.2 g/dL — ABNORMAL LOW (ref 6.0–8.3)

## 2012-02-20 LAB — APTT: aPTT: 36 seconds (ref 24–37)

## 2012-02-20 LAB — BASIC METABOLIC PANEL
CO2: 24 mEq/L (ref 19–32)
Calcium: 8.5 mg/dL (ref 8.4–10.5)
Creatinine, Ser: 2.41 mg/dL — ABNORMAL HIGH (ref 0.50–1.10)
GFR calc non Af Amer: 19 mL/min — ABNORMAL LOW (ref >90)

## 2012-02-20 LAB — MAGNESIUM: Magnesium: 1.9 mg/dL (ref 1.5–2.5)

## 2012-02-20 LAB — PROTIME-INR: INR: 1.14 (ref 0.00–1.49)

## 2012-02-21 ENCOUNTER — Encounter (HOSPITAL_COMMUNITY): Payer: Self-pay | Admitting: Otolaryngology

## 2012-02-21 DIAGNOSIS — I279 Pulmonary heart disease, unspecified: Secondary | ICD-10-CM

## 2012-02-21 LAB — CULTURE, BLOOD (ROUTINE X 2): Culture: NO GROWTH

## 2012-02-21 LAB — HEMOGLOBIN A1C: Hgb A1c MFr Bld: 4.5 % (ref <5.7)

## 2012-02-21 NOTE — Consult Note (Signed)
PULMONARY  / CRITICAL CARE MEDICINE  Name: [name] MRN: [id number] DOB: 1941/01/09    LOS: 2  REFERRING MD :  [facility]  CHIEF COMPLAINT:  VDRF and Trach.  BRIEF PATIENT DESCRIPTION: [name] is a 71 y.o. y/o female with a PMH of COPD / OSA / was admitted 3 days after a discharge from SNF on 11/14/11 with hypercarbic respiratory failure and had issues of recurrent intubatin due to recurrent left lung collapse / post intubation vocal cord edema. Underwent percutaneous tracheostomy 11/23/11 by Dr. Tyson Alias and discharged 11/25/11. After this she had an admission in mid November 2013 for abd wall cellultitis. She was brought to [facility] on 12/3 from SNF after tracheostomy was dislodged in the nursing home. In ER, tracheostomy tube was dislodged and track was closed. 12/05 Pt refused tracheostomy re-do, PEG tube dislodged>>not replaced. She developed increased 'sleepiness' per daughter at the NH & was brought to [facility] 12/23 for treatment of "pneumonia" but then suffered asystolic cardiac arrest while awaiting evaluation, was intubated and resuscitated. Hospital course complicated by HCAP, decompensated CHF, hypotension, acute on chronic renal failure and post arrest encephalopathy. She underwent re-do tracheostomy on 12/27.   Tracheosomty was placed and patient was transferred to [facility].  LINES / TUBES: Tracheosomty 12/27>>>  CULTURES: Noted  ANTIBIOTICS: Noted  PAST MEDICAL HISTORY :  Past Medical History  Diagnosis Date  . COPD (chronic obstructive pulmonary disease)     on home o2  . Chronic renal insufficiency   . CVA (cerebral infarction)     hx  . Hypertensive heart disease with congestive heart failure   . Chronic cor pulmonale   . Cardiomyopathy of undetermined type 09/07/2011  . Chronic kidney disease (CKD), stage IV (severe)   . Hyperlipdemia   . Obesity (BMI 30-39.9)   . Sleep apnea   . History of gout 09/07/2011  . Heart failure 7/13  . Shortness of breath     . Anemia   . Vascular disease   . Asthma   . Anginal pain   . Hypertension   . Type II or unspecified type diabetes mellitus without mention of complication, not stated as uncontrolled   . Stroke   . GERD (gastroesophageal reflux disease)   . Arthritis   . Bundle branch block   . Thrombocytopenia   . Acute and chronic respiratory failure    Past Surgical History  Procedure Date  . Bilateral oophorectomy   . Cardiac catheterization     right heart cath  . Laparoscopic gastrostomy 12/21/2011    Procedure: LAPAROSCOPIC GASTROSTOMY;  Surgeon: Axel Filler, MD;  Location: [facility] OR;  Service: General;  Laterality: N/A;  laparoscopic gastrostomy possible open feeding tube  . Abdominal hysterectomy   . Tracheostomy 11/2011  . Cataracts    Prior to Admission medications   Medication Sig Start Date End Date Taking? Authorizing Provider  acetaminophen (TYLENOL) 160 MG/5ML solution Place 20.3 mLs (650 mg total) into feeding tube every 6 (six) hours as needed for fever. 02/19/12   Simonne Martinet, NP  albuterol-ipratropium (COMBIVENT) 18-103 MCG/ACT inhaler Inhale 6 puffs into the lungs every 4 (four) hours. 02/19/12   Simonne Martinet, NP  albuterol-ipratropium (COMBIVENT) [id number] MCG/ACT inhaler Inhale 6 puffs into the lungs every 2 (two) hours as needed for wheezing or shortness of breath. 02/19/12   Simonne Martinet, NP  antiseptic oral rinse (BIOTENE) LIQD 15 mLs by Mouth Rinse route QID. 02/19/12   Simonne Martinet, NP  aspirin 81 MG chewable tablet Chew 1 tablet (81 mg total) by mouth daily. 02/19/12   Simonne Martinet, NP  carvedilol (COREG) 3.125 MG tablet Take 1 tablet (3.125 mg total) by mouth 2 (two) times daily with a meal. 02/19/12   Simonne Martinet, NP  chlorhexidine (PERIDEX) 0.12 % solution Use as directed 15 mLs in the mouth or throat 2 (two) times daily. 02/19/12   Simonne Martinet, NP  escitalopram (LEXAPRO) 10 MG tablet Take 1 tablet (10 mg total) by mouth daily. 01/31/12   Simonne Martinet, NP  feeding supplement (PRO-STAT SUGAR FREE 64) LIQD Place 30 mLs into feeding tube 4 (four) times daily. 02/19/12   Simonne Martinet, NP  fentaNYL (SUBLIMAZE) 0.05 MG/ML injection Inject 0.5-2 mLs (25-100 mcg total) into the vein every 2 (two) hours as needed for severe pain. 02/19/12   Simonne Martinet, NP  heparin 5000 UNIT/ML injection Inject 1 mL (5,000 Units total) into the skin every 8 (eight) hours. 02/19/12   Simonne Martinet, NP  hydrALAZINE (APRESOLINE) 20 MG/ML injection Inject 0.5-2 mLs (10-40 mg total) into the vein every 4 (four) hours as needed (SBP 180 & above). 02/19/12   Simonne Martinet, NP  hydrALAZINE (APRESOLINE) 25 MG tablet Take 25 mg by mouth 3 (three) times daily. 01/31/12   Simonne Martinet, NP  levofloxacin (LEVAQUIN) 750 MG/150ML SOLN Inject 150 mLs (750 mg total) into the vein every other day. 02/19/12 02/22/12  Simonne Martinet, NP  Nutritional Supplements (FEEDING SUPPLEMENT, JEVITY 1.2 CAL,) LIQD Place 1,000 mLs into feeding tube daily. 02/19/12   Simonne Martinet, NP  pantoprazole sodium (PROTONIX) 40 mg/20 mL PACK Place 20 mLs (40 mg total) into feeding tube daily. 02/19/12   Simonne Martinet, NP  simvastatin (ZOCOR) 20 MG tablet Take 1 tablet (20 mg total) by mouth daily at 6 PM. 02/19/12   Simonne Martinet, NP  sodium chloride 0.9 % infusion kvo 02/19/12   Simonne Martinet, NP  torsemide (DEMADEX) 20 MG tablet Take 20 mg by mouth daily.     Historical Provider, MD   Allergies  Allergen Reactions  . Sulfonamide Derivatives Hives and Rash    FAMILY HISTORY:  Family History  Problem Relation Age of Onset  . Heart attack Father   . Stroke Mother     CVA  . Coronary artery disease Daughter    SOCIAL HISTORY:  reports that she quit smoking about 5 months ago. Her smoking use included Cigarettes. She has a 55 pack-year smoking history. She has never used smokeless tobacco. She reports that she does not drink alcohol or use illicit drugs.  REVIEW OF SYSTEMS:   Unattainable.  On vent.  VITAL SIGNS: VSS-AF   VENTILATOR SETTINGS: Currently on PS 12/5.   PHYSICAL EXAMINATION: General:  Chronically ill appearing female, trached, on vent but interactive. Neuro:  Alert, interactive, moves all ext to command. HEENT:  [place]/AT, PERRL, EOM-I and MMM. Cardiovascular:  RRR, Nl S1/S2, -M/R/G. Lungs:  Coarse BS diffusely. Abdomen:  Soft, NT, ND and +BS. Musculoskeletal:  1+ edema and -tenderness. Skin:  Intact.   LABS: Cbc  Lab 02/20/12 0700 02/19/12 0440 02/18/12 0430  WBC 10.3 -- --  HGB 7.8* 8.1* 7.7*  HCT 24.5* 25.5* 24.6*  PLT 142* 139* 121*   Chemistry  Lab 02/20/12 0700 02/19/12 0440 02/18/12 0430 02/17/12 1659 02/15/12 0050  NA 143 140 139 -- --  K 3.4* 4.1 4.7 -- --  CL 110 107 104 -- --  CO2 24 23 24  -- --  BUN 41* 38* 39* -- --  CREATININE 2.41* 2.44* 2.41* -- --  CALCIUM 8.5 8.6 8.5 -- --  MG 1.9 -- -- 1.7 1.7  PHOS 3.7 -- -- -- --  GLUCOSE 125* 104* 79 -- --   Liver fxn  Lab 02/20/12 0700  AST 15  ALT 12  ALKPHOS 71  BILITOT 0.5  PROT 5.2*  ALBUMIN 2.0*   coags  Lab 02/20/12 0700 02/19/12 1815 02/17/12 0440  APTT 36 40* --  INR 1.14 1.19 1.19   Sepsis markers  Lab 02/21/12 0649 02/19/12 1257 02/16/12 0351 02/15/12 1424 02/15/12 0856 02/15/12 0059  LATICACIDVEN -- -- -- 1.3 1.2 1.5  PROCALCITON <0.10 <0.10 0.26 -- -- --   Cardiac markers  Lab 02/15/12 1425 02/15/12 0723 02/15/12 0050  CKTOTAL -- -- --  CKMB -- -- --  TROPONINI <0.30 <0.30 <0.30   BNP  Lab 02/14/12 2127  PROBNP 10254.0*   ABG  Lab 02/19/12 1300 02/15/12 0423 02/15/12 0022  PHART 7.397 7.402 7.219*  PCO2ART 41.0 44.1 72.7*  PO2ART 69.1* 95.0 274.0*  HCO3 24.7* 27.5* 29.7*  TCO2 26.0 29 32   CBG trend  Lab 02/19/12 0718 02/19/12 0405 02/19/12 0002 02/18/12 2006 02/18/12 1613  GLUCAP 109* 107* 93 108* 95   IMAGING: CXR with pulmonary edema.  DIAGNOSES: Active Problems:  * No active hospital problems. *    ASSESSMENT /  PLAN:  71 year old female with history above, tracheostomy in place, tolerating PS for 12 hours today.  The patient is interactive and I would anticipate that as she is kept near dry weight and weaning we should be able to liberate her from the vent an eventually decannulate.  The only obstacle for decannulation will be OSA as I am unsure if the patient will be able to wear CPAP.  With regards to cardiac condition, the patient needs aggressive diureses renal function permitting.  PCCM will continue to follow with you.  Alyson Reedy, M.D. [facility] Pulmonary/Critical Care Medicine. Pager: [phone]. After hours pager: [phone].

## 2012-02-22 NOTE — Anesthesia Postprocedure Evaluation (Signed)
  Anesthesia Post-op Note  Patient: [name]  Procedure(s) Performed: Procedure(s) (LRB) with comments: TRACHEOSTOMY (N/A)  [facility]  Anesthesia Type:General  Level of Consciousness: sedated  Airway and Oxygen Therapy: Patient Spontanous Breathing  Post-op Pain: none  Post-op Assessment: Post-op Vital signs reviewed, Patient's Cardiovascular Status Stable and Respiratory Function Stable  Post-op Vital Signs: Reviewed and stable  Complications: No apparent anesthesia complications

## 2012-02-23 LAB — BASIC METABOLIC PANEL
Calcium: 8.8 mg/dL (ref 8.4–10.5)
GFR calc non Af Amer: 18 mL/min — ABNORMAL LOW (ref >90)
Sodium: 141 mEq/L (ref 135–145)

## 2012-02-23 LAB — CBC
MCH: 24.8 pg — ABNORMAL LOW (ref 26.0–34.0)
MCHC: 31.4 g/dL (ref 30.0–36.0)
Platelets: 213 10*3/uL (ref 150–400)

## 2012-02-23 LAB — PROCALCITONIN: Procalcitonin: <0.1 ng/mL

## 2012-02-24 ENCOUNTER — Other Ambulatory Visit (HOSPITAL_COMMUNITY): Payer: Self-pay

## 2012-02-24 DIAGNOSIS — G9340 Encephalopathy, unspecified: Secondary | ICD-10-CM

## 2012-02-24 DIAGNOSIS — I509 Heart failure, unspecified: Secondary | ICD-10-CM

## 2012-02-24 DIAGNOSIS — I5021 Acute systolic (congestive) heart failure: Secondary | ICD-10-CM

## 2012-02-24 DIAGNOSIS — E669 Obesity, unspecified: Secondary | ICD-10-CM

## 2012-02-24 DIAGNOSIS — I469 Cardiac arrest, cause unspecified: Secondary | ICD-10-CM

## 2012-02-24 LAB — CBC
MCV: 79.1 fL (ref 78.0–100.0)
Platelets: 212 10*3/uL (ref 150–400)
RDW: 17.6 % — ABNORMAL HIGH (ref 11.5–15.5)
WBC: 7.2 10*3/uL (ref 4.0–10.5)

## 2012-02-24 LAB — BASIC METABOLIC PANEL
Chloride: 104 mEq/L (ref 96–112)
Creatinine, Ser: 2.57 mg/dL — ABNORMAL HIGH (ref 0.50–1.10)
GFR calc Af Amer: 20 mL/min — ABNORMAL LOW (ref >90)
Potassium: 3.6 mEq/L (ref 3.5–5.1)

## 2012-02-24 NOTE — Consult Note (Signed)
PULMONARY  / CRITICAL CARE MEDICINE  Name: [name] MRN: [id number] DOB: 1940/12/19    LOS: 5  REFERRING MD :  [facility]  CHIEF COMPLAINT:  VDRF and Trach.  BRIEF PATIENT DESCRIPTION: [name] is a 72 y.o. y/o female with a PMH of COPD / OSA / was admitted 3 days after a discharge from SNF on 11/14/11 with hypercarbic respiratory failure and had issues of recurrent intubatin due to recurrent left lung collapse / post intubation vocal cord edema. Underwent percutaneous tracheostomy 11/23/11 by Dr. Tyson Alias and discharged 11/25/11. After this she had an admission in mid November 2013 for abd wall cellultitis. She was brought to [facility] on 12/3 from SNF after tracheostomy was dislodged in the nursing home. In ER, tracheostomy tube was dislodged and track was closed. 12/05 Pt refused tracheostomy re-do, PEG tube dislodged>>not replaced. She developed increased 'sleepiness' per daughter at the NH & was brought to [facility] 12/23 for treatment of "pneumonia" but then suffered asystolic cardiac arrest while awaiting evaluation, was intubated and resuscitated. Hospital course complicated by HCAP, decompensated CHF, hypotension, acute on chronic renal failure and post arrest encephalopathy. She underwent re-do tracheostomy on 12/27.   Tracheosomty was placed and patient was transferred to [facility].  LINES / TUBES: Tracheosomty 12/27>>>  CULTURES: Noted  ANTIBIOTICS: Noted  VITAL SIGNS: VSS-AF   VENTILATOR SETTINGS: Currently on PS 12/5.   PHYSICAL EXAMINATION: General:  Chronically ill appearing female, trached, on vent but interactive. Neuro:  Alert, interactive, moves all ext to command. HEENT:  [place]/AT, PERRL, EOM-I and MMM. Cardiovascular:  RRR, Nl S1/S2, -M/R/G. Lungs:  Coarse BS diffusely. Abdomen:  Soft, NT, ND and +BS. Musculoskeletal:  1+ edema and -tenderness. Skin:  Intact.   LABS: Cbc  Lab 02/24/12 0740 02/23/12 0640 02/20/12 0700  WBC 7.2 -- --  HGB 7.6* 7.7* 7.8*    HCT 23.8* 24.5* 24.5*  PLT 212 213 142*   Chemistry  Lab 02/24/12 0740 02/23/12 0640 02/20/12 0700 02/17/12 1659  NA 141 141 143 --  K 3.6 3.6 3.4* --  CL 104 106 110 --  CO2 28 25 24  --  BUN 54* 52* 41* --  CREATININE 2.57* 2.49* 2.41* --  CALCIUM 8.9 8.8 8.5 --  MG -- -- 1.9 1.7  PHOS -- -- 3.7 --  GLUCOSE 115* 111* 125* --   Liver fxn  Lab 02/20/12 0700  AST 15  ALT 12  ALKPHOS 71  BILITOT 0.5  PROT 5.2*  ALBUMIN 2.0*   coags  Lab 02/20/12 0700 02/19/12 1815  APTT 36 40*  INR 1.14 1.19   Sepsis markers  Lab 02/23/12 0640 02/21/12 0649 02/19/12 1257  LATICACIDVEN -- -- --  PROCALCITON <0.10 <0.10 <0.10   Cardiac markers No results found for this basename: CKTOTAL:3,CKMB:3,TROPONINI:3 in the last 168 hours BNP No results found for this basename: PROBNP:3 in the last 168 hours ABG  Lab 02/19/12 1300  PHART 7.397  PCO2ART 41.0  PO2ART 69.1*  HCO3 24.7*  TCO2 26.0   CBG trend  Lab 02/19/12 0718 02/19/12 0405 02/19/12 0002 02/18/12 2006 02/18/12 1613  GLUCAP 109* 107* 93 108* 95   IMAGING: CXR with pulmonary edema.  DIAGNOSES: Active Problems:  * No active hospital problems. *    ASSESSMENT / PLAN:  72 year old female with history above, tracheostomy in place, tolerating PS will have today as her first day of TC x24 hours.  The patient is interactive and I would anticipate that as she  is kept near dry weight and weaning we should be able to liberate her from the vent an eventually decannulate.  The only obstacle for decannulation will be OSA as I am unsure if the patient will be able to wear CPAP.  With regards to cardiac condition, the patient needs aggressive diureses renal function permitting.  PCCM will continue to follow with you.  Alyson Reedy, M.D. [facility] Pulmonary/Critical Care Medicine. Pager: [phone]. After hours pager: [phone].

## 2012-02-25 DIAGNOSIS — I5023 Acute on chronic systolic (congestive) heart failure: Secondary | ICD-10-CM

## 2012-02-25 DIAGNOSIS — J9600 Acute respiratory failure, unspecified whether with hypoxia or hypercapnia: Secondary | ICD-10-CM

## 2012-02-25 DIAGNOSIS — J9500 Unspecified tracheostomy complication: Secondary | ICD-10-CM

## 2012-02-25 NOTE — Progress Notes (Signed)
PULMONARY  / CRITICAL CARE MEDICINE  Name: [name] MRN: [id number] DOB: 09-23-1940    LOS: 6  REFERRING MD :  [facility]  CHIEF COMPLAINT:  VDRF and Trach.  BRIEF PATIENT DESCRIPTION: [name] is a 72 y.o. y/o female with a PMH of COPD / OSA / was admitted 3 days after a discharge from SNF on 11/14/11 with hypercarbic respiratory failure and had issues of recurrent intubatin due to recurrent left lung collapse / post intubation vocal cord edema. Underwent percutaneous tracheostomy 11/23/11 by Dr. Tyson Alias and discharged 11/25/11. After this she had an admission in mid November 2013 for abd wall cellultitis. She was brought to [facility] on 12/3 from SNF after tracheostomy was dislodged in the nursing home. In ER, tracheostomy tube was dislodged and track was closed. 12/05 Pt refused tracheostomy re-do, PEG tube dislodged>>not replaced. She developed increased 'sleepiness' per daughter at the NH & was brought to [facility] 12/23 for treatment of "pneumonia" but then suffered asystolic cardiac arrest while awaiting evaluation, was intubated and resuscitated. Hospital course complicated by HCAP, decompensated CHF, hypotension, acute on chronic renal failure and post arrest encephalopathy. She underwent re-do tracheostomy on 12/27.   Tracheosomty was placed and patient was transferred to [facility].  LINES / TUBES: Tracheosomty 12/27>>>  CULTURES: Noted  ANTIBIOTICS: Noted  VITAL SIGNS: VSS-AF   VENTILATOR SETTINGS: Currently on PS 12/5.   PHYSICAL EXAMINATION: General:  Chronically ill appearing female, trached, on vent but interactive. Neuro:  Alert, interactive, moves all ext to command. HEENT:  [place]/AT, PERRL, EOM-I and MMM. Cardiovascular:  RRR, Nl S1/S2, -M/R/G. Lungs:  Coarse BS diffusely. Abdomen:  Soft, NT, ND and +BS. Musculoskeletal:  1+ edema and -tenderness. Skin:  Intact.   LABS: Cbc  Lab 02/24/12 0740 02/23/12 0640 02/20/12 0700  WBC 7.2 -- --  HGB 7.6* 7.7* 7.8*    HCT 23.8* 24.5* 24.5*  PLT 212 213 142*   Chemistry  Lab 02/24/12 0740 02/23/12 0640 02/20/12 0700  NA 141 141 143  K 3.6 3.6 3.4*  CL 104 106 110  CO2 28 25 24   BUN 54* 52* 41*  CREATININE 2.57* 2.49* 2.41*  CALCIUM 8.9 8.8 8.5  MG -- -- 1.9  PHOS -- -- 3.7  GLUCOSE 115* 111* 125*   Liver fxn  Lab 02/20/12 0700  AST 15  ALT 12  ALKPHOS 71  BILITOT 0.5  PROT 5.2*  ALBUMIN 2.0*   coags  Lab 02/20/12 0700 02/19/12 1815  APTT 36 40*  INR 1.14 1.19   Sepsis markers  Lab 02/23/12 0640 02/21/12 0649 02/19/12 1257  LATICACIDVEN -- -- --  PROCALCITON <0.10 <0.10 <0.10   Cardiac markers No results found for this basename: CKTOTAL:3,CKMB:3,TROPONINI:3 in the last 168 hours BNP No results found for this basename: PROBNP:3 in the last 168 hours ABG  Lab 02/19/12 1300  PHART 7.397  PCO2ART 41.0  PO2ART 69.1*  HCO3 24.7*  TCO2 26.0   CBG trend  Lab 02/19/12 0718 02/19/12 0405 02/19/12 0002 02/18/12 2006 02/18/12 1613  GLUCAP 109* 107* 93 108* 95   IMAGING: CXR with pulmonary edema.  DIAGNOSES: Active Problems:  * No active hospital problems. *    ASSESSMENT / PLAN:  72 year old female with history above, tracheostomy in place, tolerating TC x3 hours today, will continue weaning.  The patient is interactive and I would anticipate that as she is kept near dry weight and weaning we should be able to liberate her from the vent  an eventually decannulate.  The only obstacle for decannulation will be OSA as I am unsure if the patient will be able to wear CPAP.  With regards to cardiac condition, the patient needs aggressive diureses renal function permitting.  PCCM will continue to follow with you.  Alyson Reedy, M.D. [facility] Pulmonary/Critical Care Medicine. Pager: [phone]. After hours pager: [phone].

## 2012-02-26 LAB — BASIC METABOLIC PANEL
BUN: 58 mg/dL — ABNORMAL HIGH (ref 6–23)
Chloride: 101 mEq/L (ref 96–112)
GFR calc Af Amer: 20 mL/min — ABNORMAL LOW (ref >90)
Potassium: 3.4 mEq/L — ABNORMAL LOW (ref 3.5–5.1)
Sodium: 138 mEq/L (ref 135–145)

## 2012-02-26 LAB — CBC
HCT: 24.7 % — ABNORMAL LOW (ref 36.0–46.0)
RDW: 17 % — ABNORMAL HIGH (ref 11.5–15.5)
WBC: 7.9 10*3/uL (ref 4.0–10.5)

## 2012-02-27 LAB — BASIC METABOLIC PANEL
CO2: 28 mEq/L (ref 19–32)
Chloride: 100 mEq/L (ref 96–112)
Potassium: 3.3 mEq/L — ABNORMAL LOW (ref 3.5–5.1)
Sodium: 136 mEq/L (ref 135–145)

## 2012-02-28 LAB — POTASSIUM: Potassium: 3.8 mEq/L (ref 3.5–5.1)

## 2012-02-29 ENCOUNTER — Other Ambulatory Visit (HOSPITAL_COMMUNITY): Payer: Self-pay

## 2012-02-29 LAB — BASIC METABOLIC PANEL
BUN: 57 mg/dL — ABNORMAL HIGH (ref 6–23)
Chloride: 97 mEq/L (ref 96–112)
Glucose, Bld: 108 mg/dL — ABNORMAL HIGH (ref 70–99)
Potassium: 4 mEq/L (ref 3.5–5.1)
Sodium: 132 mEq/L — ABNORMAL LOW (ref 135–145)

## 2012-02-29 LAB — CBC
HCT: 24.6 % — ABNORMAL LOW (ref 36.0–46.0)
Hemoglobin: 7.8 g/dL — ABNORMAL LOW (ref 12.0–15.0)
RBC: 3.13 MIL/uL — ABNORMAL LOW (ref 3.87–5.11)
WBC: 9.3 10*3/uL (ref 4.0–10.5)

## 2012-03-01 LAB — HEMOGLOBIN AND HEMATOCRIT, BLOOD: Hemoglobin: 8.2 g/dL — ABNORMAL LOW (ref 12.0–15.0)

## 2012-03-01 LAB — BASIC METABOLIC PANEL
BUN: 55 mg/dL — ABNORMAL HIGH (ref 6–23)
CO2: 27 mEq/L (ref 19–32)
Chloride: 99 mEq/L (ref 96–112)
Creatinine, Ser: 2.66 mg/dL — ABNORMAL HIGH (ref 0.50–1.10)
GFR calc Af Amer: 20 mL/min — ABNORMAL LOW (ref >90)
Potassium: 3.7 mEq/L (ref 3.5–5.1)

## 2012-03-02 NOTE — Progress Notes (Signed)
PULMONARY  / CRITICAL CARE MEDICINE  Name: [name] MRN: [id number] DOB: Jun 11, 1940    LOS: 12  REFERRING MD :  [facility]  CHIEF COMPLAINT:  VDRF and Trach.  BRIEF PATIENT DESCRIPTION: [name] is a 72 y.o. y/o female with a PMH of COPD / OSA / was admitted 3 days after a discharge from SNF on 11/14/11 with hypercarbic respiratory failure and had issues of recurrent intubatin due to recurrent left lung collapse / post intubation vocal cord edema. Underwent percutaneous tracheostomy 11/23/11 by Dr. Tyson Alias and discharged 11/25/11. After this she had an admission in mid November 2013 for abd wall cellultitis. She was brought to [facility] on 12/3 from SNF after tracheostomy was dislodged in the nursing home. In ER, tracheostomy tube was dislodged and track was closed. 12/05 Pt refused tracheostomy re-do, PEG tube dislodged>>not replaced. She developed increased 'sleepiness' per daughter at the NH & was brought to [facility] 12/23 for treatment of "pneumonia" but then suffered asystolic cardiac arrest while awaiting evaluation, was intubated and resuscitated. Hospital course complicated by HCAP, decompensated CHF, hypotension, acute on chronic renal failure and post arrest encephalopathy. She underwent re-do tracheostomy on 12/27.   Tracheosomty was placed and patient was transferred to [facility].  LINES / TUBES: Tracheosomty 12/27>>>  CULTURES: Noted  ANTIBIOTICS: Noted  VITAL SIGNS: VSS-AF  sats 98% on ATC    PHYSICAL EXAMINATION: General:  Chronically ill appearing female, trached, off vent x24h Neuro:  Alert, interactive, moves all ext to command. Trys to communicate  HEENT:  [place]/AT, PERRL, EOM-I and MMM. Trach unremarkable  Cardiovascular:  RRR, Nl S1/S2, -M/R/G. Lungs:  Coarse BS diffusely. Abdomen:  Soft, NT, ND and +BS. Musculoskeletal:  1+ edema and -tenderness. Skin:  Intact.   LABS: Cbc  Lab 03/01/12 0720 02/29/12 0745 02/26/12 0550  WBC -- 9.3 --  HGB 8.2* 7.8*  8.0*  HCT 25.1* 24.6* 24.7*  PLT -- 312 252   Chemistry  Lab 03/01/12 0720 02/29/12 0745 02/28/12 0635 02/27/12 0648  NA 134* 132* -- 136  K 3.7 4.0 3.8 --  CL 99 97 -- 100  CO2 27 27 -- 28  BUN 55* 57* -- 58*  CREATININE 2.66* 2.60* -- 2.62*  CALCIUM 9.1 8.7 -- 8.7  MG -- -- -- --  PHOS -- -- -- --  GLUCOSE 103* 108* -- 107*   ASSESSMENT / PLAN: Acute on Chronic respiratory failure/ tracheostomy dependence  Severe OSA and OHS (was BIPAP dependent prior) AHI 81/h, corrected by bipap 22/17 S/p cardiac arrest. 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 Echo- EF 45-50%, gr1 diast CHF  chronic renal failure- baseline ? 1.6.  Anemia.  Mild thrombocytopenia  Dyslipidemia.   She is s/p tracheostomy on 12/27. Now off vent X 24h.  She should NEVER be de-cannulated. But looks like she is improving from a rehab stand-point  Plan: Work on rehab efforts: SLP for [facility] and [place] care No decannulation    Billy Fischer, MD ; [facility] service Mobile [phone].  After 5:30 PM or weekends, call [phone]

## 2012-03-03 DIAGNOSIS — Z930 Tracheostomy status: Secondary | ICD-10-CM

## 2012-03-03 DIAGNOSIS — J9620 Acute and chronic respiratory failure, unspecified whether with hypoxia or hypercapnia: Secondary | ICD-10-CM

## 2012-03-03 LAB — BASIC METABOLIC PANEL
BUN: 51 mg/dL — ABNORMAL HIGH (ref 6–23)
Chloride: 99 mEq/L (ref 96–112)
Creatinine, Ser: 2.41 mg/dL — ABNORMAL HIGH (ref 0.50–1.10)
GFR calc non Af Amer: 19 mL/min — ABNORMAL LOW (ref >90)
Glucose, Bld: 106 mg/dL — ABNORMAL HIGH (ref 70–99)
Potassium: 3.8 mEq/L (ref 3.5–5.1)

## 2012-03-03 LAB — CBC
HCT: 26.2 % — ABNORMAL LOW (ref 36.0–46.0)
Hemoglobin: 8.4 g/dL — ABNORMAL LOW (ref 12.0–15.0)
MCHC: 32.1 g/dL (ref 30.0–36.0)
MCV: 78.7 fL (ref 78.0–100.0)
RDW: 17.5 % — ABNORMAL HIGH (ref 11.5–15.5)

## 2012-03-06 ENCOUNTER — Other Ambulatory Visit (HOSPITAL_COMMUNITY): Payer: Medicare Other

## 2012-03-06 LAB — BASIC METABOLIC PANEL
BUN: 58 mg/dL — ABNORMAL HIGH (ref 6–23)
CO2: 26 mEq/L (ref 19–32)
Calcium: 9.1 mg/dL (ref 8.4–10.5)
Chloride: 95 mEq/L — ABNORMAL LOW (ref 96–112)
Creatinine, Ser: 1.95 mg/dL — ABNORMAL HIGH (ref 0.50–1.10)

## 2012-03-06 LAB — CBC
HCT: 27.5 % — ABNORMAL LOW (ref 36.0–46.0)
MCH: 25.2 pg — ABNORMAL LOW (ref 26.0–34.0)
MCHC: 32.4 g/dL (ref 30.0–36.0)
MCHC: 32.2 g/dL (ref 30.0–36.0)
MCV: 78.6 fL (ref 78.0–100.0)
Platelets: 260 10*3/uL (ref 150–400)
Platelets: 271 10*3/uL (ref 150–400)
RDW: 17.7 % — ABNORMAL HIGH (ref 11.5–15.5)
RDW: 17.5 % — ABNORMAL HIGH (ref 11.5–15.5)

## 2012-03-07 DIAGNOSIS — L03319 Cellulitis of trunk, unspecified: Secondary | ICD-10-CM

## 2012-03-07 DIAGNOSIS — N179 Acute kidney failure, unspecified: Secondary | ICD-10-CM

## 2012-03-07 DIAGNOSIS — N189 Chronic kidney disease, unspecified: Secondary | ICD-10-CM

## 2012-03-07 LAB — BASIC METABOLIC PANEL
Calcium: 9.3 mg/dL (ref 8.4–10.5)
GFR calc Af Amer: 25 mL/min — ABNORMAL LOW (ref >90)
GFR calc non Af Amer: 22 mL/min — ABNORMAL LOW (ref >90)
Glucose, Bld: 94 mg/dL (ref 70–99)
Sodium: 137 mEq/L (ref 135–145)

## 2012-03-07 LAB — CBC WITH DIFFERENTIAL/PLATELET
Eosinophils Absolute: 0.2 10*3/uL (ref 0.0–0.7)
Lymphs Abs: 2.1 10*3/uL (ref 0.7–4.0)
MCH: 25 pg — ABNORMAL LOW (ref 26.0–34.0)
Neutro Abs: 6.3 10*3/uL (ref 1.7–7.7)
Neutrophils Relative %: 68 % (ref 43–77)
Platelets: 295 10*3/uL (ref 150–400)
RBC: 3.44 MIL/uL — ABNORMAL LOW (ref 3.87–5.11)
WBC: 9.3 10*3/uL (ref 4.0–10.5)

## 2012-03-07 NOTE — Progress Notes (Signed)
PULMONARY  / CRITICAL CARE MEDICINE  Name: BRILEY BUMGARNER MRN: [id number] DOB: 1940-03-12    LOS: 17  REFERRING MD :  [facility]  CHIEF COMPLAINT:  VDRF and Trach.  BRIEF PATIENT DESCRIPTION: [name] is a 72 y.o. y/o female with a PMH of COPD / OSA / was admitted 3 days after a discharge from SNF on 11/14/11 with hypercarbic respiratory failure and had issues of recurrent intubatin due to recurrent left lung collapse / post intubation vocal cord edema. Underwent percutaneous tracheostomy 11/23/11 by Dr. Tyson Alias and discharged 11/25/11. After this she had an admission in mid November 2013 for abd wall cellultitis. She was brought to [facility] on 12/3 from SNF after tracheostomy was dislodged in the nursing home. In ER, tracheostomy tube was dislodged and track was closed. 12/05 Pt refused tracheostomy re-do, PEG tube dislodged>>not replaced. She developed increased 'sleepiness' per daughter at the NH & was brought to [facility] 12/23 for treatment of "pneumonia" but then suffered asystolic cardiac arrest while awaiting evaluation, was intubated and resuscitated. Hospital course complicated by HCAP, decompensated CHF, hypotension, acute on chronic renal failure and post arrest encephalopathy. She underwent re-do tracheostomy on 12/27.   Tracheosomty was placed and patient was transferred to [facility].  LINES / TUBES: Tracheosomty 12/27>>>  CULTURES: Noted  ANTIBIOTICS: Noted  VITAL SIGNS: VSS-AF  sats 98% on ATC    PHYSICAL EXAMINATION: General:  Chronically ill appearing female, trached Neuro:  Alert, interactive, moves all ext to command. Oriented doing well w/ PMV HEENT:  [place]/AT, PERRL, EOM-I and MMM. Trach unremarkable  Cardiovascular:  RRR, Nl S1/S2, -M/R/G. Lungs:  Decreased  Abdomen:  Soft, NT, ND and +BS. Musculoskeletal:  1+ edema and -tenderness. Skin:  Intact.   LABS: Cbc  Lab 03/07/12 0602 03/06/12 1712 03/06/12 0700  WBC 9.3 -- --  HGB 8.6* 8.8* 8.9*  HCT 27.2* 27.3*  27.5*  PLT 295 271 260   Chemistry  Lab 03/07/12 0602 03/06/12 0700 03/03/12 0610  NA 137 133* 136  K 3.6 5.3* 3.8  CL 98 95* 99  CO2 30 26 28   BUN 61* 58* 51*  CREATININE 2.16* 1.95* 2.41*  CALCIUM 9.3 9.1 9.0  MG -- -- --  PHOS -- -- --  GLUCOSE 94 121* 106*   ASSESSMENT / PLAN: Acute on Chronic respiratory failure/ tracheostomy dependence  Severe OSA and OHS (was BIPAP dependent prior) AHI 81/h, corrected by bipap 22/17 S/p cardiac arrest. 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 Echo- EF 45-50%, gr1 diast CHF  chronic renal failure- baseline ? 1.6: holding on low 2 range.  Anemia (chronic): no evidence of bleeding  Mild thrombocytopenia: stable  Dyslipidemia.   She is s/p tracheostomy on 12/27. Now off vent X 24h.  She should NEVER be de-cannulated. But looks like she is improving from a rehab stand-point. Now tolerating feeds and PMV.   Plan: Work on rehab effort Trach care No decannulation  We will see PRN.    [place]ur Rossetti. Tyson Alias, MD, FACP Pgr: [phone] [place]monary & Critical Care

## 2012-03-08 LAB — BASIC METABOLIC PANEL
GFR calc Af Amer: 27 mL/min — ABNORMAL LOW (ref >90)
GFR calc non Af Amer: 24 mL/min — ABNORMAL LOW (ref >90)
Glucose, Bld: 94 mg/dL (ref 70–99)
Potassium: 3.8 mEq/L (ref 3.5–5.1)
Sodium: 139 mEq/L (ref 135–145)

## 2012-03-09 LAB — CBC
Hemoglobin: 8.3 g/dL — ABNORMAL LOW (ref 12.0–15.0)
MCH: 25.5 pg — ABNORMAL LOW (ref 26.0–34.0)
RBC: 3.26 MIL/uL — ABNORMAL LOW (ref 3.87–5.11)

## 2012-03-09 LAB — BASIC METABOLIC PANEL
CO2: 29 mEq/L (ref 19–32)
Glucose, Bld: 101 mg/dL — ABNORMAL HIGH (ref 70–99)
Potassium: 4 mEq/L (ref 3.5–5.1)
Sodium: 139 mEq/L (ref 135–145)

## 2012-03-09 LAB — CK TOTAL AND CKMB (NOT AT ARMC)
CK, MB: 2.3 ng/mL (ref 0.3–4.0)
CK, MB: 2.4 ng/mL (ref 0.3–4.0)
Relative Index: INVALID (ref 0.0–2.5)
Total CK: 36 U/L (ref 7–177)

## 2012-03-09 LAB — CK: Total CK: 34 U/L (ref 7–177)

## 2012-03-10 LAB — BASIC METABOLIC PANEL
Calcium: 9.3 mg/dL (ref 8.4–10.5)
GFR calc Af Amer: 25 mL/min — ABNORMAL LOW (ref >90)
GFR calc non Af Amer: 21 mL/min — ABNORMAL LOW (ref >90)
Glucose, Bld: 109 mg/dL — ABNORMAL HIGH (ref 70–99)

## 2012-03-11 LAB — CBC
Hemoglobin: 8 g/dL — ABNORMAL LOW (ref 12.0–15.0)
MCH: 25.2 pg — ABNORMAL LOW (ref 26.0–34.0)
Platelets: 229 10*3/uL (ref 150–400)
RBC: 3.18 MIL/uL — ABNORMAL LOW (ref 3.87–5.11)
WBC: 8.5 10*3/uL (ref 4.0–10.5)

## 2012-03-11 LAB — BASIC METABOLIC PANEL
BUN: 61 mg/dL — ABNORMAL HIGH (ref 6–23)
CO2: 26 mEq/L (ref 19–32)
Chloride: 99 mEq/L (ref 96–112)
Creatinine, Ser: 2.2 mg/dL — ABNORMAL HIGH (ref 0.50–1.10)
Glucose, Bld: 92 mg/dL (ref 70–99)

## 2012-03-12 LAB — CBC
HCT: 27.5 % — ABNORMAL LOW (ref 36.0–46.0)
MCHC: 30.5 g/dL (ref 30.0–36.0)
MCV: 81.1 fL (ref 78.0–100.0)
RDW: 17.4 % — ABNORMAL HIGH (ref 11.5–15.5)
WBC: 8.1 10*3/uL (ref 4.0–10.5)

## 2012-03-12 LAB — BASIC METABOLIC PANEL
BUN: 62 mg/dL — ABNORMAL HIGH (ref 6–23)
Chloride: 98 mEq/L (ref 96–112)
Creatinine, Ser: 2.22 mg/dL — ABNORMAL HIGH (ref 0.50–1.10)
GFR calc Af Amer: 24 mL/min — ABNORMAL LOW (ref >90)

## 2012-03-14 ENCOUNTER — Other Ambulatory Visit (HOSPITAL_COMMUNITY): Payer: Self-pay

## 2012-03-14 LAB — BASIC METABOLIC PANEL
CO2: 29 mEq/L (ref 19–32)
Calcium: 9.3 mg/dL (ref 8.4–10.5)
Creatinine, Ser: 2.05 mg/dL — ABNORMAL HIGH (ref 0.50–1.10)

## 2012-03-15 LAB — CBC
MCH: 24.9 pg — ABNORMAL LOW (ref 26.0–34.0)
MCV: 80.8 fL (ref 78.0–100.0)
Platelets: 206 10*3/uL (ref 150–400)
RDW: 17.1 % — ABNORMAL HIGH (ref 11.5–15.5)
WBC: 7.1 10*3/uL (ref 4.0–10.5)

## 2012-03-15 LAB — BASIC METABOLIC PANEL
Calcium: 9.2 mg/dL (ref 8.4–10.5)
Chloride: 101 mEq/L (ref 96–112)
Creatinine, Ser: 2.21 mg/dL — ABNORMAL HIGH (ref 0.50–1.10)
GFR calc Af Amer: 24 mL/min — ABNORMAL LOW (ref >90)
GFR calc non Af Amer: 21 mL/min — ABNORMAL LOW (ref >90)

## 2012-03-17 ENCOUNTER — Other Ambulatory Visit (HOSPITAL_COMMUNITY): Payer: Self-pay

## 2012-03-17 LAB — BASIC METABOLIC PANEL
Chloride: 101 mEq/L (ref 96–112)
GFR calc Af Amer: 23 mL/min — ABNORMAL LOW (ref >90)
Potassium: 4.3 mEq/L (ref 3.5–5.1)

## 2012-03-17 LAB — CBC
Platelets: 210 10*3/uL (ref 150–400)
RDW: 17.3 % — ABNORMAL HIGH (ref 11.5–15.5)
WBC: 8 10*3/uL (ref 4.0–10.5)

## 2012-04-28 ENCOUNTER — Inpatient Hospital Stay (HOSPITAL_COMMUNITY)
Admission: EM | Admit: 2012-04-28 | Discharge: 2012-05-03 | DRG: 189 | Disposition: A | Discharge status: Home / Self Care | Payer: Medicare Other | Attending: Internal Medicine | Admitting: Internal Medicine

## 2012-04-28 ENCOUNTER — Emergency Department (HOSPITAL_COMMUNITY): Payer: Medicare Other

## 2012-04-28 ENCOUNTER — Encounter (HOSPITAL_COMMUNITY): Payer: Self-pay | Admitting: Emergency Medicine

## 2012-04-28 DIAGNOSIS — Z7982 Long term (current) use of aspirin: Secondary | ICD-10-CM

## 2012-04-28 DIAGNOSIS — N179 Acute kidney failure, unspecified: Secondary | ICD-10-CM

## 2012-04-28 DIAGNOSIS — I279 Pulmonary heart disease, unspecified: Secondary | ICD-10-CM

## 2012-04-28 DIAGNOSIS — I2789 Other specified pulmonary heart diseases: Secondary | ICD-10-CM

## 2012-04-28 DIAGNOSIS — Z8249 Family history of ischemic heart disease and other diseases of the circulatory system: Secondary | ICD-10-CM

## 2012-04-28 DIAGNOSIS — J45909 Unspecified asthma, uncomplicated: Secondary | ICD-10-CM

## 2012-04-28 DIAGNOSIS — J449 Chronic obstructive pulmonary disease, unspecified: Secondary | ICD-10-CM

## 2012-04-28 DIAGNOSIS — Z9981 Dependence on supplemental oxygen: Secondary | ICD-10-CM

## 2012-04-28 DIAGNOSIS — E669 Obesity, unspecified: Secondary | ICD-10-CM

## 2012-04-28 DIAGNOSIS — G4733 Obstructive sleep apnea (adult) (pediatric): Secondary | ICD-10-CM

## 2012-04-28 DIAGNOSIS — N183 Chronic kidney disease, stage 3 unspecified: Secondary | ICD-10-CM

## 2012-04-28 DIAGNOSIS — I469 Cardiac arrest, cause unspecified: Secondary | ICD-10-CM

## 2012-04-28 DIAGNOSIS — J9602 Acute respiratory failure with hypercapnia: Secondary | ICD-10-CM

## 2012-04-28 DIAGNOSIS — Z930 Tracheostomy status: Secondary | ICD-10-CM

## 2012-04-28 DIAGNOSIS — F17200 Nicotine dependence, unspecified, uncomplicated: Secondary | ICD-10-CM

## 2012-04-28 DIAGNOSIS — I428 Other cardiomyopathies: Secondary | ICD-10-CM | POA: Diagnosis present

## 2012-04-28 DIAGNOSIS — Z79899 Other long term (current) drug therapy: Secondary | ICD-10-CM

## 2012-04-28 DIAGNOSIS — E785 Hyperlipidemia, unspecified: Secondary | ICD-10-CM

## 2012-04-28 DIAGNOSIS — Z882 Allergy status to sulfonamides status: Secondary | ICD-10-CM

## 2012-04-28 DIAGNOSIS — J189 Pneumonia, unspecified organism: Secondary | ICD-10-CM

## 2012-04-28 DIAGNOSIS — G9340 Encephalopathy, unspecified: Secondary | ICD-10-CM

## 2012-04-28 DIAGNOSIS — J4489 Other specified chronic obstructive pulmonary disease: Secondary | ICD-10-CM

## 2012-04-28 DIAGNOSIS — K7460 Unspecified cirrhosis of liver: Secondary | ICD-10-CM

## 2012-04-28 DIAGNOSIS — I5023 Acute on chronic systolic (congestive) heart failure: Secondary | ICD-10-CM

## 2012-04-28 DIAGNOSIS — I798 Other disorders of arteries, arterioles and capillaries in diseases classified elsewhere: Secondary | ICD-10-CM | POA: Diagnosis present

## 2012-04-28 DIAGNOSIS — I110 Hypertensive heart disease with heart failure: Secondary | ICD-10-CM

## 2012-04-28 DIAGNOSIS — N189 Chronic kidney disease, unspecified: Secondary | ICD-10-CM

## 2012-04-28 DIAGNOSIS — Z823 Family history of stroke: Secondary | ICD-10-CM

## 2012-04-28 DIAGNOSIS — L0390 Cellulitis, unspecified: Secondary | ICD-10-CM

## 2012-04-28 DIAGNOSIS — I452 Bifascicular block: Secondary | ICD-10-CM

## 2012-04-28 DIAGNOSIS — I5021 Acute systolic (congestive) heart failure: Secondary | ICD-10-CM

## 2012-04-28 DIAGNOSIS — Z87891 Personal history of nicotine dependence: Secondary | ICD-10-CM

## 2012-04-28 DIAGNOSIS — Z8673 Personal history of transient ischemic attack (TIA), and cerebral infarction without residual deficits: Secondary | ICD-10-CM

## 2012-04-28 DIAGNOSIS — K219 Gastro-esophageal reflux disease without esophagitis: Secondary | ICD-10-CM | POA: Diagnosis present

## 2012-04-28 DIAGNOSIS — J9500 Unspecified tracheostomy complication: Secondary | ICD-10-CM

## 2012-04-28 DIAGNOSIS — J9610 Chronic respiratory failure, unspecified whether with hypoxia or hypercapnia: Principal | ICD-10-CM

## 2012-04-28 DIAGNOSIS — I429 Cardiomyopathy, unspecified: Secondary | ICD-10-CM

## 2012-04-28 DIAGNOSIS — I509 Heart failure, unspecified: Secondary | ICD-10-CM | POA: Diagnosis present

## 2012-04-28 DIAGNOSIS — M109 Gout, unspecified: Secondary | ICD-10-CM

## 2012-04-28 DIAGNOSIS — I130 Hypertensive heart and chronic kidney disease with heart failure and stage 1 through stage 4 chronic kidney disease, or unspecified chronic kidney disease: Secondary | ICD-10-CM | POA: Diagnosis present

## 2012-04-28 DIAGNOSIS — D649 Anemia, unspecified: Secondary | ICD-10-CM

## 2012-04-28 DIAGNOSIS — J9620 Acute and chronic respiratory failure, unspecified whether with hypoxia or hypercapnia: Secondary | ICD-10-CM

## 2012-04-28 DIAGNOSIS — Z6829 Body mass index (BMI) 29.0-29.9, adult: Secondary | ICD-10-CM

## 2012-04-28 DIAGNOSIS — R0600 Dyspnea, unspecified: Secondary | ICD-10-CM

## 2012-04-28 DIAGNOSIS — M7989 Other specified soft tissue disorders: Secondary | ICD-10-CM

## 2012-04-28 DIAGNOSIS — L03311 Cellulitis of abdominal wall: Secondary | ICD-10-CM

## 2012-04-28 DIAGNOSIS — E1159 Type 2 diabetes mellitus with other circulatory complications: Secondary | ICD-10-CM

## 2012-04-28 DIAGNOSIS — N184 Chronic kidney disease, stage 4 (severe): Secondary | ICD-10-CM

## 2012-04-28 DIAGNOSIS — I2510 Atherosclerotic heart disease of native coronary artery without angina pectoris: Secondary | ICD-10-CM

## 2012-04-28 LAB — CBC WITH DIFFERENTIAL/PLATELET
Basophils Absolute: 0 10*3/uL (ref 0.0–0.1)
HCT: 36.3 % (ref 36.0–46.0)
Hemoglobin: 11.2 g/dL — ABNORMAL LOW (ref 12.0–15.0)
Lymphocytes Relative: 23 % (ref 12–46)
Monocytes Absolute: 0.8 10*3/uL (ref 0.1–1.0)
Monocytes Relative: 9 % (ref 3–12)
Neutro Abs: 5.8 10*3/uL (ref 1.7–7.7)
Neutrophils Relative %: 67 % (ref 43–77)
RDW: 16.8 % — ABNORMAL HIGH (ref 11.5–15.5)
WBC: 8.8 10*3/uL (ref 4.0–10.5)

## 2012-04-28 LAB — POCT I-STAT 3, ART BLOOD GAS (G3+)
Acid-Base Excess: 2 mmol/L (ref 0.0–2.0)
Bicarbonate: 28.4 mEq/L — ABNORMAL HIGH (ref 20.0–24.0)
O2 Saturation: 94 %
Patient temperature: 98.6
TCO2: 30 mmol/L (ref 0–100)
pO2, Arterial: 75 mmHg — ABNORMAL LOW (ref 80.0–100.0)

## 2012-04-28 LAB — COMPREHENSIVE METABOLIC PANEL
AST: 15 U/L (ref 0–37)
Alkaline Phosphatase: 83 U/L (ref 39–117)
CO2: 27 mEq/L (ref 19–32)
Chloride: 105 mEq/L (ref 96–112)
Creatinine, Ser: 2.09 mg/dL — ABNORMAL HIGH (ref 0.50–1.10)
GFR calc non Af Amer: 23 mL/min — ABNORMAL LOW (ref >90)
Potassium: 3.6 mEq/L (ref 3.5–5.1)
Total Bilirubin: 0.2 mg/dL — ABNORMAL LOW (ref 0.3–1.2)

## 2012-04-28 LAB — PRO B NATRIURETIC PEPTIDE: Pro B Natriuretic peptide (BNP): 3133 pg/mL — ABNORMAL HIGH (ref 0–125)

## 2012-04-28 MED ORDER — ESCITALOPRAM OXALATE 10 MG PO TABS
10.0000 mg | ORAL_TABLET | Freq: Every day | ORAL | Status: DC
  Administered 2012-04-28 – 2012-05-03 (×6): 10 mg via ORAL
  Filled 2012-04-28 (×7): qty 1

## 2012-04-28 MED ORDER — ACETAMINOPHEN 160 MG/5ML PO SOLN
650.0000 mg | Freq: Four times a day (QID) | ORAL | Status: DC | PRN
  Filled 2012-04-28: qty 20.3

## 2012-04-28 MED ORDER — ASPIRIN 81 MG PO CHEW
81.0000 mg | CHEWABLE_TABLET | Freq: Every day | ORAL | Status: DC
  Administered 2012-04-28 – 2012-05-03 (×6): 81 mg via ORAL
  Filled 2012-04-28 (×6): qty 1

## 2012-04-28 MED ORDER — HYDRALAZINE HCL 25 MG PO TABS
25.0000 mg | ORAL_TABLET | Freq: Three times a day (TID) | ORAL | Status: DC
  Administered 2012-04-28 – 2012-05-03 (×14): 25 mg via ORAL
  Filled 2012-04-28 (×19): qty 1

## 2012-04-28 MED ORDER — PRO-STAT SUGAR FREE PO LIQD
30.0000 mL | Freq: Four times a day (QID) | ORAL | Status: DC
  Administered 2012-04-29 – 2012-05-03 (×12): 30 mL
  Filled 2012-04-28 (×20): qty 30

## 2012-04-28 MED ORDER — ONDANSETRON HCL 4 MG/2ML IJ SOLN: 4.0000 mg | Freq: Four times a day (QID) | INTRAMUSCULAR | Status: DC | PRN

## 2012-04-28 MED ORDER — CARVEDILOL 3.125 MG PO TABS
3.1250 mg | ORAL_TABLET | Freq: Two times a day (BID) | ORAL | Status: DC
  Administered 2012-04-29 – 2012-05-03 (×9): 3.125 mg via ORAL
  Filled 2012-04-28 (×14): qty 1

## 2012-04-28 MED ORDER — HEPARIN SODIUM (PORCINE) 5000 UNIT/ML IJ SOLN
5000.0000 [IU] | Freq: Three times a day (TID) | INTRAMUSCULAR | Status: DC
  Administered 2012-04-28 – 2012-05-02 (×13): 5000 [IU] via SUBCUTANEOUS
  Filled 2012-04-28 (×17): qty 1

## 2012-04-28 MED ORDER — ONDANSETRON HCL 4 MG/2ML IJ SOLN: 4.0000 mg | Freq: Three times a day (TID) | INTRAMUSCULAR | Status: DC | PRN

## 2012-04-28 MED ORDER — PANTOPRAZOLE SODIUM 40 MG PO PACK
40.0000 mg | PACK | Freq: Every day | ORAL | Status: DC
  Administered 2012-04-29: 40 mg
  Filled 2012-04-28 (×3): qty 20

## 2012-04-28 MED ORDER — CHLORHEXIDINE GLUCONATE 0.12 % MT SOLN
15.0000 mL | Freq: Two times a day (BID) | OROMUCOSAL | Status: DC
  Administered 2012-04-28 – 2012-05-03 (×10): 15 mL via OROMUCOSAL
  Filled 2012-04-28 (×8): qty 15

## 2012-04-28 MED ORDER — ACETAMINOPHEN 650 MG RE SUPP: 650.0000 mg | Freq: Four times a day (QID) | RECTAL | Status: DC | PRN

## 2012-04-28 MED ORDER — BIOTENE DRY MOUTH MT LIQD
1.0000 "application " | Freq: Four times a day (QID) | OROMUCOSAL | Status: DC
  Administered 2012-04-29 – 2012-05-03 (×4): 15 mL via OROMUCOSAL

## 2012-04-28 MED ORDER — ALBUTEROL SULFATE (5 MG/ML) 0.5% IN NEBU
2.5000 mg | INHALATION_SOLUTION | Freq: Four times a day (QID) | RESPIRATORY_TRACT | Status: DC
  Administered 2012-04-28 – 2012-04-30 (×9): 2.5 mg via RESPIRATORY_TRACT
  Filled 2012-04-28 (×8): qty 0.5

## 2012-04-28 MED ORDER — ACETAMINOPHEN 325 MG PO TABS: 650.0000 mg | ORAL_TABLET | Freq: Four times a day (QID) | ORAL | Status: DC | PRN

## 2012-04-28 MED ORDER — HYDRALAZINE HCL 20 MG/ML IJ SOLN
10.0000 mg | INTRAMUSCULAR | Status: DC | PRN
  Filled 2012-04-28 (×2): qty 2

## 2012-04-28 MED ORDER — TORSEMIDE 20 MG PO TABS
20.0000 mg | ORAL_TABLET | Freq: Every day | ORAL | Status: DC
  Administered 2012-04-28 – 2012-05-03 (×6): 20 mg via ORAL
  Filled 2012-04-28 (×7): qty 1

## 2012-04-28 MED ORDER — SODIUM CHLORIDE 0.9 % IV SOLN
INTRAVENOUS | Status: AC
  Administered 2012-04-28: 10 mL/h via INTRAVENOUS

## 2012-04-28 MED ORDER — SODIUM CHLORIDE 0.9 % IV SOLN
INTRAVENOUS | Status: DC
  Administered 2012-04-28: 20:00:00 via INTRAVENOUS

## 2012-04-28 MED ORDER — IPRATROPIUM BROMIDE 0.02 % IN SOLN
RESPIRATORY_TRACT | Status: AC
  Administered 2012-04-28: 0.5 mg
  Filled 2012-04-28: qty 2.5

## 2012-04-28 MED ORDER — INSULIN ASPART 100 UNIT/ML ~~LOC~~ SOLN
0.0000 [IU] | Freq: Three times a day (TID) | SUBCUTANEOUS | Status: DC
  Administered 2012-05-02: 1 [IU] via SUBCUTANEOUS

## 2012-04-28 MED ORDER — IPRATROPIUM BROMIDE 0.02 % IN SOLN
0.5000 mg | Freq: Four times a day (QID) | RESPIRATORY_TRACT | Status: DC
  Administered 2012-04-28 – 2012-04-29 (×3): 0.5 mg via RESPIRATORY_TRACT
  Administered 2012-04-29: 0.2 mg via RESPIRATORY_TRACT
  Administered 2012-04-29 – 2012-04-30 (×5): 0.5 mg via RESPIRATORY_TRACT
  Filled 2012-04-28 (×8): qty 2.5

## 2012-04-28 MED ORDER — SIMVASTATIN 20 MG PO TABS
20.0000 mg | ORAL_TABLET | Freq: Every day | ORAL | Status: DC
  Administered 2012-04-29 – 2012-05-02 (×4): 20 mg via ORAL
  Filled 2012-04-28 (×6): qty 1

## 2012-04-28 MED ORDER — JEVITY 1.2 CAL PO LIQD
1000.0000 mL | ORAL | Status: DC
Start: 2012-04-28 — End: 2012-04-28
  Filled 2012-04-28: qty 1000

## 2012-04-28 MED ORDER — ONDANSETRON HCL 4 MG PO TABS: 4.0000 mg | ORAL_TABLET | Freq: Four times a day (QID) | ORAL | Status: DC | PRN

## 2012-04-28 MED ORDER — IPRATROPIUM-ALBUTEROL 18-103 MCG/ACT IN AERO
6.0000 | INHALATION_SPRAY | RESPIRATORY_TRACT | Status: DC
  Filled 2012-04-28: qty 14.7

## 2012-04-28 MED ORDER — ALBUTEROL SULFATE (5 MG/ML) 0.5% IN NEBU
INHALATION_SOLUTION | RESPIRATORY_TRACT | Status: AC
  Administered 2012-04-28: 2.5 mg
  Filled 2012-04-28: qty 0.5

## 2012-04-28 MED ORDER — FENTANYL CITRATE 0.05 MG/ML IJ SOLN: 25.0000 ug | INTRAMUSCULAR | Status: DC | PRN

## 2012-04-28 NOTE — Progress Notes (Signed)
Pt has her PMV on with 4L [place]. Pt instructed that she needs to have her trach capped if she is going use a [place]. Pt placed on 40% ATC at this time with no complications noted. RT will monitor.

## 2012-04-28 NOTE — H&P (Signed)
[facility] History and Physical  [name] ZOX:[id number] DOB: 02-Aug-1940 DOA: 04/28/2012  Referring physician: Dr. Manus Gunning PCP: Gwynneth Aliment, MD  Specialists: none  Chief Complaint: ran out of power and shortness of breath  HPI: [name] is a 72 y.o. 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 She started feeling short of breath, she called her daughter the code get into multiple per day had no patency is. She came here to the emergency room. In the emergency room she satting 100% on 4 L in no acute distress the rest.  Review of Systems: The patient denies anorexia, fever, weight loss,, vision loss, decreased hearing, hoarseness, chest pain, syncope, dyspnea on exertion, peripheral edema, balance deficits, hemoptysis, abdominal pain, melena, hematochezia, severe indigestion/heartburn, hematuria, incontinence, genital sores, muscle weakness, suspicious skin lesions, transient blindness, difficulty walking, depression, unusual weight change, abnormal bleeding, enlarged lymph nodes, angioedema, and breast masses.    Past Medical History  Diagnosis Date  . COPD (chronic obstructive pulmonary disease)     on home o2  . Chronic renal insufficiency   . CVA (cerebral infarction)     hx  . Hypertensive heart disease with congestive heart failure   . Chronic cor pulmonale   . Cardiomyopathy of undetermined type 09/07/2011  . Chronic kidney disease (CKD), stage IV (severe)   . Hyperlipdemia   . Obesity (BMI 30-39.9)   . Sleep apnea   . History of gout 09/07/2011  . Heart failure 7/13  . Shortness of breath   . Anemia   . Vascular disease   . Asthma   . Anginal pain   . Hypertension   . Type II or unspecified type diabetes mellitus without mention of  complication, not stated as uncontrolled   . Stroke   . GERD (gastroesophageal reflux disease)   . Arthritis   . Bundle branch block   . Thrombocytopenia   . Acute and chronic respiratory failure    Past Surgical History  Procedure Laterality Date  . Bilateral oophorectomy    . Cardiac catheterization      right heart cath  . Laparoscopic gastrostomy  12/21/2011    Procedure: LAPAROSCOPIC GASTROSTOMY;  Surgeon: Axel Filler, MD;  Location: [facility] OR;  Service: General;  Laterality: N/A;  laparoscopic gastrostomy possible open feeding tube  . Abdominal hysterectomy    . Tracheostomy  11/2011  . Cataracts    . Tracheostomy tube placement  02/18/2012    Procedure: TRACHEOSTOMY;  Surgeon: Serena Colonel, MD;  Location: [facility] OR;  Service: ENT;  Laterality: N/A;   Social History:  reports that she quit smoking about 8 months ago. Her smoking use included Cigarettes. She has a 55 pack-year smoking history. She has never used smokeless tobacco. She reports that she does not drink alcohol or use illicit drugs. Is at home by herself and perform all of her ADLs Allergies  Allergen Reactions  . Sulfonamide Derivatives Hives and Rash    Family History  Problem Relation Age of Onset  . Heart attack Father   . Stroke Mother     CVA  . Coronary artery disease Daughter     Prior to Admission medications   Medication Sig Start Date End Date Taking? Authorizing Provider  acetaminophen (TYLENOL) 160 MG/5ML solution Place  20.3 mLs (650 mg total) into feeding tube every 6 (six) hours as needed for fever. 02/19/12   Simonne Martinet, NP  albuterol-ipratropium (COMBIVENT) 18-103 MCG/ACT inhaler Inhale 6 puffs into the lungs every 4 (four) hours. 02/19/12   Simonne Martinet, NP  albuterol-ipratropium (COMBIVENT) [phone] MCG/ACT inhaler Inhale 6 puffs into the lungs every 2 (two) hours as needed for wheezing or shortness of breath. 02/19/12   Simonne Martinet, NP  antiseptic oral rinse (BIOTENE) LIQD 15 mLs by  Mouth Rinse route QID. 02/19/12   Simonne Martinet, NP  aspirin 81 MG chewable tablet Chew 1 tablet (81 mg total) by mouth daily. 02/19/12   Simonne Martinet, NP  carvedilol (COREG) 3.125 MG tablet Take 1 tablet (3.125 mg total) by mouth 2 (two) times daily with a meal. 02/19/12   Simonne Martinet, NP  chlorhexidine (PERIDEX) 0.12 % solution Use as directed 15 mLs in the mouth or throat 2 (two) times daily. 02/19/12   Simonne Martinet, NP  escitalopram (LEXAPRO) 10 MG tablet Take 1 tablet (10 mg total) by mouth daily. 01/31/12   Simonne Martinet, NP  feeding supplement (PRO-STAT SUGAR FREE 64) LIQD Place 30 mLs into feeding tube 4 (four) times daily. 02/19/12   Simonne Martinet, NP  fentaNYL (SUBLIMAZE) 0.05 MG/ML injection Inject 0.5-2 mLs (25-100 mcg total) into the vein every 2 (two) hours as needed for severe pain. 02/19/12   Simonne Martinet, NP  heparin 5000 UNIT/ML injection Inject 1 mL (5,000 Units total) into the skin every 8 (eight) hours. 02/19/12   Simonne Martinet, NP  hydrALAZINE (APRESOLINE) 20 MG/ML injection Inject 0.5-2 mLs (10-40 mg total) into the vein every 4 (four) hours as needed (SBP 180 & above). 02/19/12   Simonne Martinet, NP  hydrALAZINE (APRESOLINE) 25 MG tablet Take 25 mg by mouth 3 (three) times daily. 01/31/12   Simonne Martinet, NP  Nutritional Supplements (FEEDING SUPPLEMENT, JEVITY 1.2 CAL,) LIQD Place 1,000 mLs into feeding tube daily. 02/19/12   Simonne Martinet, NP  pantoprazole sodium (PROTONIX) 40 mg/20 mL PACK Place 20 mLs (40 mg total) into feeding tube daily. 02/19/12   Simonne Martinet, NP  simvastatin (ZOCOR) 20 MG tablet Take 1 tablet (20 mg total) by mouth daily at 6 PM. 02/19/12   Simonne Martinet, NP  sodium chloride 0.9 % infusion kvo 02/19/12   Simonne Martinet, NP  torsemide (DEMADEX) 20 MG tablet Take 20 mg by mouth daily.     Historical Provider, MD   Physical Exam: Filed Vitals:   04/28/12 1615 04/28/12 1653 04/28/12 1700 04/28/12 1735  BP: 148/75 148/75  148/75   Pulse: 63 64 64 66  Temp:      TempSrc:      Resp: 22 19 20 21   SpO2: 100% 100% 100% 94%    BP 148/75  Pulse 66  Temp(Src) 98.4 F (36.9 C) (Oral)  Resp 21  SpO2 94%  General Appearance:    Alert, cooperative, no distress, appears stated age  Head:    Normocephalic, without obvious abnormality, atraumatic  Eyes:    PERRL, conjunctiva/corneas clear, EOM's intact, fundi    benign, both eyes        Throat:   poor oral hygiene   Neck:   trachea in place, nonerythematous nontender to touch no drainage   Back:     Symmetric, no curvature, ROM normal, no CVA tenderness  Lungs:  Clear to auscultation bilaterally, nonlabored breathing   Chest Wall:    No tenderness or deformity   Heart:    Regular rate and rhythm, S1 and S2 normal, no murmur, rub   or gallop     Abdomen:     Soft, non-tender, bowel sounds active all four quadrants,    no masses, no organomegaly        Extremities:   Extremities normal, atraumatic, no cyanosis , trace edema   Pulses:   2+ and symmetric all extremities  Skin:   Skin color, texture, turgor normal, no rashes or lesions  Lymph nodes:   Cervical, supraclavicular, and axillary nodes normal  Neurologic:   CNII-XII intact, normal strength, sensation and reflexes    throughout    Labs on Admission:  Basic Metabolic Panel:  Recent Labs Lab 04/28/12 1610  NA 142  K 3.6  CL 105  CO2 27  GLUCOSE 71  BUN 31*  CREATININE 2.09*  CALCIUM 9.7   Liver Function Tests:  Recent Labs Lab 04/28/12 1610  AST 15  ALT 11  ALKPHOS 83  BILITOT 0.2*  PROT 6.8  ALBUMIN 3.1*   No results found for this basename: LIPASE, AMYLASE,  in the last 168 hours No results found for this basename: AMMONIA,  in the last 168 hours CBC:  Recent Labs Lab 04/28/12 1610  WBC 8.8  NEUTROABS 5.8  HGB 11.2*  HCT 36.3  MCV 81.9  PLT 199   Cardiac Enzymes:  Recent Labs Lab 04/28/12 1611  TROPONINI <0.30    BNP (last 3 results)  Recent Labs   01/28/12 0540 02/14/12 2127 04/28/12 1611  PROBNP 8936.0* 10254.0* 3133.0*   CBG: No results found for this basename: GLUCAP,  in the last 168 hours  Radiological Exams on Admission: Dg Chest 2 View  04/28/2012  *RADIOLOGY REPORT*  Clinical Data: Chronic cough, hypertension, shortness of breath  CHEST - 2 VIEW  Comparison: 03/17/2012  Findings: Cardiomegaly noted with central vascular congestion. Chronic left midlung scarring.  No definite superimposed CHF or pneumonia.  No large effusion or pneumothorax.  Tracheostomy in the mid trachea.  Atherosclerotic changes of the aorta.  IMPRESSION: Stable cardiomegaly and vascular congestion.  Chronic left midlung scarring  No superimposed CHF or pneumonia   Original Report Authenticated By: Judie Petit. Miles Costain, M.D.     EKG: Independently reviewed. None  Assessment/Plan   Chronic respiratory failure/Tracheostomy status: - Go ahead and admit this patient under observation continue her on FiO2 40%. I am not about to let this patient go home without power as she is dependent on her O2 pressure viable. Last case manager to help out , to see there is some way we get her oxygen so we get her home tomorrow morning. At this time she relates that she has no shortness of breath her lungs are clear to auscultation she is breathing 16 times per minute and satting 100% on FiO2 of 40%. arterial blood gases pH of 7.35 PCO2 of 50 PO2 of 75, no leukocytosis afebrile chest x-ray showed no acute abnormalities. D-dimer is mildly elevated. Agree with VQ scan.   Obesity (BMI 30-39.9): - Counseling.  CKD (chronic kidney disease) stage 3, GFR 30-59 ml/min - Her creatinine is better than baseline today is 2.0 continue current home medications.   Type 2 diabetes mellitus with vascular disease: - She's currently on no medications for diabetes at home will start on sliding scale insulin here. Check CBGs a.c. and at bedtime.  Code Status: full Family Communication:  none Disposition Plan: observation  Time spent: 45 minutes  Marinda Elk [facility] Pager [phone]  If 7PM-7AM, please contact night-coverage [email or web address] Password [facility] 04/28/2012, 6:55 PM

## 2012-04-28 NOTE — ED Notes (Signed)
Pt presents to ED with c/o of shortness of breath. Pt has trach and home O2. NAD.

## 2012-04-28 NOTE — ED Provider Notes (Signed)
History     CSN: [id number]  Arrival date & time 04/28/12  1553   First MD Initiated Contact with Patient 04/28/12 1556      Chief Complaint  Patient presents with  . Shortness of Breath    (Consider location/radiation/quality/duration/timing/severity/associated sxs/prior treatment) HPI Comments: Patient presents with difficulty breathing for the past day. She has a history of COPD status post tracheostomy and is on home oxygen 4 L. She has no power at home and is worried that her oxygen is going to run out. She denies any chest pain or fever. Denies any abdominal pain, nausea or vomiting. She denies any leg swelling. She was hospitalized about 3 months ago after a cardiopulmonary arrest and had her tracheostomy replaced at that time.  The history is provided by the patient and the EMS personnel.    Past Medical History  Diagnosis Date  . COPD (chronic obstructive pulmonary disease)     on home o2  . Chronic renal insufficiency   . CVA (cerebral infarction)     hx  . Hypertensive heart disease with congestive heart failure   . Chronic cor pulmonale   . Cardiomyopathy of undetermined type 09/07/2011  . Chronic kidney disease (CKD), stage IV (severe)   . Hyperlipdemia   . Obesity (BMI 30-39.9)   . Sleep apnea   . History of gout 09/07/2011  . Heart failure 7/13  . Shortness of breath   . Anemia   . Vascular disease   . Asthma   . Anginal pain   . Hypertension   . Type II or unspecified type diabetes mellitus without mention of complication, not stated as uncontrolled   . Stroke   . GERD (gastroesophageal reflux disease)   . Arthritis   . Bundle branch block   . Thrombocytopenia   . Acute and chronic respiratory failure     Past Surgical History  Procedure Laterality Date  . Bilateral oophorectomy    . Cardiac catheterization      right heart cath  . Laparoscopic gastrostomy  12/21/2011    Procedure: LAPAROSCOPIC GASTROSTOMY;  Surgeon: Axel Filler, MD;   Location: [facility] OR;  Service: General;  Laterality: N/A;  laparoscopic gastrostomy possible open feeding tube  . Abdominal hysterectomy    . Tracheostomy  11/2011  . Cataracts    . Tracheostomy tube placement  02/18/2012    Procedure: TRACHEOSTOMY;  Surgeon: Serena Colonel, MD;  Location: [facility] OR;  Service: ENT;  Laterality: N/A;    Family History  Problem Relation Age of Onset  . Heart attack Father   . Stroke Mother     CVA  . Coronary artery disease Daughter     History  Substance Use Topics  . Smoking status: Former Smoker -- 1.00 packs/day for 55 years    Types: Cigarettes    Quit date: 08/28/2011  . Smokeless tobacco: Never Used  . Alcohol Use: No    OB History   Grav Para Term Preterm Abortions TAB SAB Ect Mult Living                  Review of Systems  Constitutional: Positive for activity change and appetite change.  HENT: Negative for congestion and rhinorrhea.   Respiratory: Positive for shortness of breath. Negative for chest tightness.   Cardiovascular: Negative for chest pain.  Gastrointestinal: Negative for nausea, vomiting and abdominal pain.  Genitourinary: Negative for dysuria.  Musculoskeletal: Negative for back pain.  Skin: Negative for rash.  Neurological: Negative  for dizziness, weakness and headaches.  A complete 10 system review of systems was obtained and all systems are negative except as noted in the HPI and PMH.    Allergies  Sulfonamide derivatives  Home Medications   Current Outpatient Rx  Name  Route  Sig  Dispense  Refill  . acetaminophen (TYLENOL) 160 MG/5ML solution   Per Tube   Place 20.3 mLs (650 mg total) into feeding tube every 6 (six) hours as needed for fever.   120 mL      . albuterol-ipratropium (COMBIVENT) 18-103 MCG/ACT inhaler   Inhalation   Inhale 6 puffs into the lungs every 4 (four) hours.         Marland Kitchen albuterol-ipratropium (COMBIVENT) 18-103 MCG/ACT inhaler   Inhalation   Inhale 6 puffs into the lungs every 2 (two)  hours as needed for wheezing or shortness of breath.         Marland Kitchen antiseptic oral rinse (BIOTENE) LIQD   Mouth Rinse   15 mLs by Mouth Rinse route QID.         Marland Kitchen aspirin 81 MG chewable tablet   Oral   Chew 1 tablet (81 mg total) by mouth daily.         . carvedilol (COREG) 3.125 MG tablet   Oral   Take 1 tablet (3.125 mg total) by mouth 2 (two) times daily with a meal.         . chlorhexidine (PERIDEX) 0.12 % solution   Mouth/Throat   Use as directed 15 mLs in the mouth or throat 2 (two) times daily.   120 mL      . escitalopram (LEXAPRO) 10 MG tablet   Oral   Take 1 tablet (10 mg total) by mouth daily.         . feeding supplement (PRO-STAT SUGAR FREE 64) LIQD   Per Tube   Place 30 mLs into feeding tube 4 (four) times daily.   900 mL      . fentaNYL (SUBLIMAZE) 0.05 MG/ML injection   Intravenous   Inject 0.5-2 mLs (25-100 mcg total) into the vein every 2 (two) hours as needed for severe pain.   2 mL      . heparin 5000 UNIT/ML injection   Subcutaneous   Inject 1 mL (5,000 Units total) into the skin every 8 (eight) hours.   1 mL      . hydrALAZINE (APRESOLINE) 20 MG/ML injection   Intravenous   Inject 0.5-2 mLs (10-40 mg total) into the vein every 4 (four) hours as needed (SBP 180 & above).   1 mL      . hydrALAZINE (APRESOLINE) 25 MG tablet   Oral   Take 25 mg by mouth 3 (three) times daily.         . Nutritional Supplements (FEEDING SUPPLEMENT, JEVITY 1.2 CAL,) LIQD   Per Tube   Place 1,000 mLs into feeding tube daily.         . pantoprazole sodium (PROTONIX) 40 mg/20 mL PACK   Per Tube   Place 20 mLs (40 mg total) into feeding tube daily.   30 each      . simvastatin (ZOCOR) 20 MG tablet   Oral   Take 1 tablet (20 mg total) by mouth daily at 6 PM.   30 tablet      . sodium chloride 0.9 % infusion      kvo         . torsemide (DEMADEX) 20 MG  tablet   Oral   Take 20 mg by mouth daily.            BP 148/75  Pulse 64   Temp(Src) 98.4 F (36.9 C) (Oral)  Resp 20  SpO2 100%  Physical Exam  Constitutional: She is oriented to person, place, and time. She appears well-developed and well-nourished. No distress.  No distress speaking in full sentences  HENT:  Head: Normocephalic and atraumatic.  Mouth/Throat: Oropharynx is clear and moist.  Eyes: Conjunctivae and EOM are normal. Pupils are equal, round, and reactive to light.  Neck: Normal range of motion. Neck supple.  Trach in place  Cardiovascular: Normal rate, regular rhythm and normal heart sounds.   No murmur heard. Pulmonary/Chest: Effort normal and breath sounds normal. No respiratory distress.  Abdominal: Soft. There is no tenderness. There is no rebound and no guarding.  obese  Musculoskeletal: Normal range of motion. She exhibits edema. She exhibits no tenderness.  +2 pedal edema  Neurological: She is oriented to person, place, and time. No cranial nerve deficit. She exhibits normal muscle tone. Coordination normal.  Skin: Skin is warm.    ED Course  Procedures (including critical care time)  Labs Reviewed  CBC WITH DIFFERENTIAL - Abnormal; Notable for the following:    Hemoglobin 11.2 (*)    MCH 25.3 (*)    RDW 16.8 (*)    All other components within normal limits  COMPREHENSIVE METABOLIC PANEL - Abnormal; Notable for the following:    BUN 31 (*)    Creatinine, Ser 2.09 (*)    Albumin 3.1 (*)    Total Bilirubin 0.2 (*)    GFR calc non Af Amer 23 (*)    GFR calc Af Amer 26 (*)    All other components within normal limits  PRO B NATRIURETIC PEPTIDE - Abnormal; Notable for the following:    Pro B Natriuretic peptide (BNP) 3133.0 (*)    All other components within normal limits  TROPONIN I   Dg Chest 2 View  04/28/2012  *RADIOLOGY REPORT*  Clinical Data: Chronic cough, hypertension, shortness of breath  CHEST - 2 VIEW  Comparison: 03/17/2012  Findings: Cardiomegaly noted with central vascular congestion. Chronic left midlung  scarring.  No definite superimposed CHF or pneumonia.  No large effusion or pneumothorax.  Tracheostomy in the mid trachea.  Atherosclerotic changes of the aorta.  IMPRESSION: Stable cardiomegaly and vascular congestion.  Chronic left midlung scarring  No superimposed CHF or pneumonia   Original Report Authenticated By: Judie Petit. Miles Costain, M.D.      No diagnosis found.    MDM  Tracheostomy patient who is oxygen dependent presenting with shortness of breath and no oxygen at home.  Creatinine is at baseline. Patient is in no distress, lungs clear. ABG does not show any CO2 retention or hypoxia. She is stable on 28% trach collar.  Will admit for monitoring given patient's tenuous respiratory status and recent cardiac arrest. Will need continuous oxygen with possible positive pressure ventilation as needed. Patient refusing VQ scan saying she cannot lay flat. This is a chronic problem for her.    Date: 04/28/2012  Rate: 65  Rhythm: normal sinus rhythm  QRS Axis: left  Intervals: normal  ST/T Wave abnormalities: normal  Conduction Disutrbances:right bundle branch block  Narrative Interpretation:   Old EKG Reviewed: unchanged        Glynn Octave, MD 04/28/12 2205

## 2012-04-29 ENCOUNTER — Encounter (HOSPITAL_COMMUNITY): Payer: Self-pay | Admitting: Emergency Medicine

## 2012-04-29 DIAGNOSIS — I279 Pulmonary heart disease, unspecified: Secondary | ICD-10-CM

## 2012-04-29 DIAGNOSIS — Z930 Tracheostomy status: Secondary | ICD-10-CM

## 2012-04-29 LAB — GLUCOSE, CAPILLARY
Glucose-Capillary: 86 mg/dL (ref 70–99)
Glucose-Capillary: 92 mg/dL (ref 70–99)

## 2012-04-29 NOTE — Progress Notes (Signed)
Continuous  Pulse ox ordered for pt.  RN aware.

## 2012-04-29 NOTE — Progress Notes (Signed)
CARE MANAGEMENT NOTE 04/29/2012  Patient:  [name], [name]   Account Number:  [id number]  Date Initiated:  04/29/2012  Documentation initiated by:  Vance Peper  Subjective/Objective Assessment:   72 yr old female with trach, oxygen dependent.     Action/Plan:   patient will discharge home when power restored. lives alone. CM spoke with patient.   Anticipated DC Date:  05/01/2012   Anticipated DC Plan:  HOME/SELF CARE      DC Planning Services  CM consult      Choice offered to / List presented to:             Status of service:  In process, will continue to follow Medicare Important Message given?   (If response is "NO", the following Medicare IM given date fields will be blank) Date Medicare IM given:   Date Additional Medicare IM given:    Discharge Disposition:    Per UR Regulation:    If discussed at Long Length of Stay Meetings, dates discussed:    Comments:

## 2012-04-29 NOTE — Progress Notes (Signed)
Pt refused to have a V/Q scan done.

## 2012-04-29 NOTE — Discharge Summary (Addendum)
Physician Discharge Summary  [name]:[id number] DOB: 19-Mar-1940 DOA: 04/28/2012  PCP: Gwynneth Aliment, MD  Admit date: 04/28/2012 Discharge date: 05/01/2012  Time spent: 30 minutes  Recommendations for Outpatient Follow-up:  Follow up with PCP regular visits  Discharge Diagnoses:  Principal Problem:   Chronic respiratory failure Active Problems:   Obesity (BMI 30-39.9)   CKD (chronic kidney disease) stage 3, GFR 30-59 ml/min   Type 2 diabetes mellitus with vascular disease   Tracheostomy status   Discharge Condition: stable  Diet recommendation: heart healthy  Filed Weights   04/28/12 2025 04/29/12 2034 04/30/12 0526  Weight: 78.2 kg (172 lb 6.4 oz) 78.1 kg (172 lb 2.9 oz) 77.8 kg (171 lb 8.3 oz)    History of present illness:  72 y.o. 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 She started feeling short of breath, she called her daughter the code get into multiple per day had no patency is. She came here to the emergency room. In the emergency room she satting 100% on 4 L in no acute distress the rest.   Hospital Course:  Chronic respiratory failure/Tracheostomy status:  - Case manager to helped out with oxygen for home. - She relates that she has no shortness of breath is unchanged. She refuses VQ scan as she can only flex to sleep. She is not tachycardic or hypoxic. And she is on heparin prophylactically for DVT at home. - breathing 18 times per minute and satting 100% on FiO2 of 40%.   Obesity (BMI 30-39.9):  - Counseling.   CKD (chronic kidney disease) stage 3, GFR 30-59 ml/min  - Her creatinine is better than baseline today is 2.0 continue current home medications.   Procedures:  None  Consultations:  None  Discharge Exam: Filed Vitals:    05/01/12 0946 05/01/12 0951 05/01/12 1149 05/01/12 1259  BP:  165/66    Pulse:  67  73  Temp:  98.6 F (37 C)    TempSrc:  Oral    Resp:  18  16  Height:      Weight:      SpO2: 100% 100% 95% 97%    General: Awake alert and oriented x3 Cardiovascular: Regular rate and rhythm Respiratory: Good air movement and clear to auscultation  Discharge Instructions      Discharge Orders   Future Orders Complete By Expires     Diet - low sodium heart healthy  As directed     Diet - low sodium heart healthy  As directed     Increase activity slowly  As directed     Increase activity slowly  As directed         Medication List    TAKE these medications       acetaminophen 325 MG tablet  Commonly known as:  TYLENOL  Take 325 mg by mouth every 6 (six) hours as needed for fever (for mild pain).     amLODipine 10 MG tablet  Commonly known as:  NORVASC  Take 10 mg by mouth daily.     aspirin 81 MG chewable tablet  Chew 1 tablet (81 mg total) by mouth daily.     busPIRone 5 MG tablet  Commonly known as:  BUSPAR  Take 5 mg by mouth 2 (  two) times daily.     carvedilol 3.125 MG tablet  Commonly known as:  COREG  Take 1 tablet (3.125 mg total) by mouth 2 (two) times daily with a meal.     chlorhexidine 0.12 % solution  Commonly known as:  PERIDEX  Use as directed 15 mLs in the mouth or throat 2 (two) times daily.     cloNIDine 0.1 MG tablet  Commonly known as:  CATAPRES  Take 0.1 mg by mouth 2 (two) times daily.     ferrous sulfate 325 (65 FE) MG EC tablet  Take 325 mg by mouth 2 (two) times daily.     guaiFENesin 600 MG 12 hr tablet  Commonly known as:  MUCINEX  Take 600 mg by mouth 2 (two) times daily.     hydrALAZINE 100 MG tablet  Commonly known as:  APRESOLINE  Take 100 mg by mouth 3 (three) times daily.     metoCLOPramide 5 MG tablet  Commonly known as:  REGLAN  Take 5 mg by mouth 4 (four) times daily.     oxycodone 5 MG capsule  Commonly known as:  OXY-IR   Take 5 mg by mouth every 6 (six) hours as needed (for moderate pain).     pantoprazole 20 MG tablet  Commonly known as:  PROTONIX  Take 20 mg by mouth every evening.     pravastatin 40 MG tablet  Commonly known as:  PRAVACHOL  Take 40 mg by mouth daily.     sennosides-docusate sodium 8.6-50 MG tablet  Commonly known as:  SENOKOT-S  Take 1 tablet by mouth 2 (two) times daily as needed for constipation.     sertraline 100 MG tablet  Commonly known as:  ZOLOFT  Take 100 mg by mouth at bedtime.     torsemide 20 MG tablet  Commonly known as:  DEMADEX  Take 20 mg by mouth 2 (two) times daily.     vitamin C 500 MG tablet  Commonly known as:  ASCORBIC ACID  Take 500 mg by mouth daily.       Follow-up Information   Follow up with Gwynneth Aliment, MD In 2 weeks.   Contact information:   [address] [place] [address] [phone]        The results of significant diagnostics from this hospitalization (including imaging, microbiology, ancillary and laboratory) are listed below for reference.    Significant Diagnostic Studies: Dg Chest 2 View  04/28/2012  *RADIOLOGY REPORT*  Clinical Data: Chronic cough, hypertension, shortness of breath  CHEST - 2 VIEW  Comparison: 03/17/2012  Findings: Cardiomegaly noted with central vascular congestion. Chronic left midlung scarring.  No definite superimposed CHF or pneumonia.  No large effusion or pneumothorax.  Tracheostomy in the mid trachea.  Atherosclerotic changes of the aorta.  IMPRESSION: Stable cardiomegaly and vascular congestion.  Chronic left midlung scarring  No superimposed CHF or pneumonia   Original Report Authenticated By: Judie Petit. Miles Costain, M.D.     Microbiology: No results found for this or any previous visit (from the past 240 hour(s)).   Labs: Basic Metabolic Panel:  Recent Labs Lab 04/28/12 1610 05/01/12 0635  NA 142 141  K 3.6 3.4*  CL 105 104  CO2 27 30  GLUCOSE 71 92  BUN 31* 36*  CREATININE 2.09*  2.13*  CALCIUM 9.7 9.1   Liver Function Tests:  Recent Labs Lab 04/28/12 1610  AST 15  ALT 11  ALKPHOS 83  BILITOT 0.2*  PROT  6.8  ALBUMIN 3.1*   No results found for this basename: LIPASE, AMYLASE,  in the last 168 hours No results found for this basename: AMMONIA,  in the last 168 hours CBC:  Recent Labs Lab 04/28/12 1610 05/01/12 0635  WBC 8.8 7.8  NEUTROABS 5.8  --   HGB 11.2* 10.6*  HCT 36.3 34.3*  MCV 81.9 80.7  PLT 199 181   Cardiac Enzymes:  Recent Labs Lab 04/28/12 1611  TROPONINI <0.30   BNP: BNP (last 3 results)  Recent Labs  01/28/12 0540 02/14/12 2127 04/28/12 1611  PROBNP 8936.0* 10254.0* 3133.0*   CBG:  Recent Labs Lab 04/30/12 1227 04/30/12 1745 04/30/12 2154 05/01/12 0746 05/01/12 1156  GLUCAP 111* 80 77 93 115*       Signed:  FELIZ ORTIZ, Jenette Rayson  [facility] 05/01/2012, 1:19 PM

## 2012-04-29 NOTE — Progress Notes (Addendum)
PT Cancellation/Discharge Note  Patient Details Name: [name] MRN: [id number] DOB: 11/21/1940   Cancelled Treatment:    Reason Eval Not Completed: Pt stated she does not need PT. States she is familiar with PT and has worked with PT in the past. States her doctor did not tell her PT was ordered and denies need. States she lives at home with her daughter (denies living at [facility] just PTA, as some places in her chart indicate). States she is mobilizing well with her walker and daughter's home has a level entry. Reports she has been getting up fine with nursing to use the bathroom. Again she refused to participate with PT eval of mobility, strength, and balance.   SASSER,LYNN 04/29/2012, 4:28 PM Pager [phone]

## 2012-04-29 NOTE — Progress Notes (Signed)
Nutrition Brief Note  Patient identified on the Malnutrition Screening Tool (MST) Report  Body mass index is 29.58 kg/(m^2). Patient meets criteria for overweight based on current BMI.   Current diet order is Low Sodium, patient is consuming approximately 0-25% of meals at this time per her report. Labs and medications reviewed.   Pt state she was losing wt intentionally PTA.  States she left [facility] on "Monday this week" and was encouraged to lose 10 more lbs.  Pt is refusing meals due to preference of sodium in diet.  Will notify MD.  No additional nutrition interventions warranted at this time. If nutrition issues arise, please consult RD.   Loyce Dys, MS RD LDN Clinical Inpatient Dietitian Pager: [phone] Weekend/After hours pager: [phone]

## 2012-04-29 NOTE — Progress Notes (Signed)
Patient arrived to unit via ED stretcher accompanied by RN. A&O, NAD, skin warm and dry. Pt cx of feeling like she cannot breath, RT called to bedside to assist with trach care and set up, inner cannula changed, PMV removed and at bedside. Per admission screening, pt states she is not living at home right now, states has been staying at [facility]. Pt comfortable with no cx, meal and drink given, will continue to monitor.

## 2012-04-29 NOTE — Progress Notes (Signed)
Utilization Review Completed.   Kimberly Tucker, RN, BSN Nurse Case Manager  [phone]  

## 2012-04-30 DIAGNOSIS — I509 Heart failure, unspecified: Secondary | ICD-10-CM

## 2012-04-30 DIAGNOSIS — I110 Hypertensive heart disease with heart failure: Secondary | ICD-10-CM

## 2012-04-30 DIAGNOSIS — R0609 Other forms of dyspnea: Secondary | ICD-10-CM

## 2012-04-30 DIAGNOSIS — N189 Chronic kidney disease, unspecified: Secondary | ICD-10-CM

## 2012-04-30 DIAGNOSIS — N179 Acute kidney failure, unspecified: Secondary | ICD-10-CM

## 2012-04-30 DIAGNOSIS — E669 Obesity, unspecified: Secondary | ICD-10-CM

## 2012-04-30 LAB — GLUCOSE, CAPILLARY: Glucose-Capillary: 77 mg/dL (ref 70–99)

## 2012-04-30 MED ORDER — AMLODIPINE BESYLATE 5 MG PO TABS
5.0000 mg | ORAL_TABLET | Freq: Every day | ORAL | Status: DC
  Administered 2012-04-30 – 2012-05-03 (×4): 5 mg via ORAL
  Filled 2012-04-30 (×4): qty 1

## 2012-04-30 MED ORDER — PANTOPRAZOLE SODIUM 40 MG PO TBEC
40.0000 mg | DELAYED_RELEASE_TABLET | Freq: Every day | ORAL | Status: DC
  Administered 2012-04-30 – 2012-05-03 (×4): 40 mg via ORAL
  Filled 2012-04-30 (×4): qty 1

## 2012-04-30 MED ORDER — PANTOPRAZOLE SODIUM 40 MG PO PACK: 40.0000 mg | PACK | Freq: Every day | ORAL | Status: DC

## 2012-04-30 MED ORDER — ALBUTEROL SULFATE (5 MG/ML) 0.5% IN NEBU
2.5000 mg | INHALATION_SOLUTION | Freq: Three times a day (TID) | RESPIRATORY_TRACT | Status: DC
  Administered 2012-05-01 – 2012-05-03 (×7): 2.5 mg via RESPIRATORY_TRACT
  Filled 2012-04-30 (×7): qty 0.5

## 2012-04-30 MED ORDER — IPRATROPIUM BROMIDE 0.02 % IN SOLN
0.5000 mg | Freq: Three times a day (TID) | RESPIRATORY_TRACT | Status: DC
  Administered 2012-05-01 – 2012-05-03 (×7): 0.5 mg via RESPIRATORY_TRACT
  Filled 2012-04-30 (×7): qty 2.5

## 2012-04-30 NOTE — Progress Notes (Signed)
[facility] PROGRESS NOTE  [name]:[id number] DOB: 11-02-1940 DOA: 04/28/2012 PCP: Gwynneth Aliment, MD  Assessment/Plan: Chronic respiratory failure/Tracheostomy status - Case manager to helped out with oxygen for home, tomorrow.  - She relates that she has no shortness of breath is unchanged.  D-dimer elevated, patient has declined CT scan.  Patient is on chronic prophylactic subcutaneous heparin at home.    Obesity (BMI 30-39.9):  - Counseling.   CKD (chronic kidney disease) stage 3, GFR 30-59 ml/min  - Her creatinine is better than baseline today is 2.0 continue current home medications.   Uncontrolled hypertension - Not well controlled.  Continue home antihypertensive medications.  Initiate amlodipine 5 mg daily.  Titrated as tolerated.  Code Status: Presumed full code. Family Communication: Patient updated at bedside. Disposition Plan: Patient reports that she still does not have power available at home.  Patient is on chronic trach requiring continuous oxygen, which is dependent on continuous power supply.  Patient does not have a safe discharge at home.  Consultants:  None  Procedures:  None  Antibiotics:  None  HPI/Subjective: No specific concerns.  She feels like her breathing is at her baseline.  Objective: Filed Vitals:   04/30/12 0307 04/30/12 0526 04/30/12 0940 04/30/12 1441  BP:  186/92  182/88  Pulse: 60 66  68  Temp:  98.5 F (36.9 C)  98.5 F (36.9 C)  TempSrc:  Oral  Oral  Resp: 16 18  18   Height:  5\' 4"  (1.626 m)    Weight:  77.8 kg (171 lb 8.3 oz)    SpO2: 100% 100% 100% 100%   No intake or output data in the 24 hours ending 04/30/12 1522 Filed Weights   04/28/12 2025 04/29/12 2034 04/30/12 0526  Weight: 78.2 kg (172 lb 6.4 oz) 78.1 kg (172 lb 2.9 oz) 77.8 kg (171 lb 8.3 oz)    Exam: Physical Exam: General: Awake, Oriented, No acute distress. HEENT: EOMI, trach in place. Neck: Supple CV: S1 and S2 Lungs: Coarse  breath sounds bilaterally, good air movement. Abdomen: Soft, Nontender, Nondistended, +bowel sounds. Ext: Good pulses. Trace edema.  Data Reviewed: Basic Metabolic Panel:  Recent Labs Lab 04/28/12 1610  NA 142  K 3.6  CL 105  CO2 27  GLUCOSE 71  BUN 31*  CREATININE 2.09*  CALCIUM 9.7   Liver Function Tests:  Recent Labs Lab 04/28/12 1610  AST 15  ALT 11  ALKPHOS 83  BILITOT 0.2*  PROT 6.8  ALBUMIN 3.1*   No results found for this basename: LIPASE, AMYLASE,  in the last 168 hours No results found for this basename: AMMONIA,  in the last 168 hours CBC:  Recent Labs Lab 04/28/12 1610  WBC 8.8  NEUTROABS 5.8  HGB 11.2*  HCT 36.3  MCV 81.9  PLT 199   Cardiac Enzymes:  Recent Labs Lab 04/28/12 1611  TROPONINI <0.30   BNP (last 3 results)  Recent Labs  01/28/12 0540 02/14/12 2127 04/28/12 1611  PROBNP 8936.0* 10254.0* 3133.0*   CBG:  Recent Labs Lab 04/29/12 1218 04/29/12 1721 04/29/12 2126 04/30/12 0821 04/30/12 1227  GLUCAP 92 84 118* 92 111*    No results found for this or any previous visit (from the past 240 hour(s)).   Studies: Dg Chest 2 View  04/28/2012  *RADIOLOGY REPORT*  Clinical Data: Chronic cough, hypertension, shortness of breath  CHEST - 2 VIEW  Comparison: 03/17/2012  Findings: Cardiomegaly noted with central vascular congestion. Chronic left midlung scarring.  No definite superimposed CHF or pneumonia.  No large effusion or pneumothorax.  Tracheostomy in the mid trachea.  Atherosclerotic changes of the aorta.  IMPRESSION: Stable cardiomegaly and vascular congestion.  Chronic left midlung scarring  No superimposed CHF or pneumonia   Original Report Authenticated By: Judie Petit. Shick, M.D.     Scheduled Meds: . albuterol  2.5 mg Nebulization Q6H  . antiseptic oral rinse  1 application Mouth Rinse QID  . aspirin  81 mg Oral Daily  . carvedilol  3.125 mg Oral BID WC  . chlorhexidine  15 mL Mouth/Throat BID  . escitalopram  10 mg Oral  Daily  . feeding supplement  30 mL Per Tube QID  . heparin  5,000 Units Subcutaneous Q8H  . hydrALAZINE  25 mg Oral TID  . insulin aspart  0-9 Units Subcutaneous TID WC  . ipratropium  0.5 mg Nebulization Q6H  . pantoprazole  40 mg Oral Daily  . simvastatin  20 mg Oral q1800  . torsemide  20 mg Oral Daily   Continuous Infusions:   Principal Problem:   Chronic respiratory failure Active Problems:   Type 2 diabetes mellitus with vascular disease   Obesity (BMI 30-39.9)   Tracheostomy status   CKD (chronic kidney disease) stage 3, GFR 30-59 ml/min    REDDY,SRIKAR A, MD  [facility] Pager [phone]. If 7PM-7AM, please contact night-coverage at [email or web address], password [facility] 04/30/2012, 3:22 PM  LOS: 2 days

## 2012-04-30 NOTE — Progress Notes (Signed)
Pt transferred to Rm/6N20, report called to [facility]. All trach supplies transferred with pt, no s/s of distress at this time. Pt resting comfortably.

## 2012-04-30 NOTE — Progress Notes (Signed)
Report recvd from [facility] RN/ pt arrived in bed/ alert and oriented/ no distress/ Oriented to room/ no c/o discomfort/ Pt has Passey valve on/ bp 168/92 p 66 r 18/ pt has #6 shiley trach

## 2012-05-01 LAB — CBC
HCT: 34.3 % — ABNORMAL LOW (ref 36.0–46.0)
MCV: 80.7 fL (ref 78.0–100.0)
RBC: 4.25 MIL/uL (ref 3.87–5.11)
WBC: 7.8 10*3/uL (ref 4.0–10.5)

## 2012-05-01 LAB — BASIC METABOLIC PANEL
BUN: 36 mg/dL — ABNORMAL HIGH (ref 6–23)
CO2: 30 mEq/L (ref 19–32)
Chloride: 104 mEq/L (ref 96–112)
Creatinine, Ser: 2.13 mg/dL — ABNORMAL HIGH (ref 0.50–1.10)

## 2012-05-01 LAB — GLUCOSE, CAPILLARY
Glucose-Capillary: 93 mg/dL (ref 70–99)
Glucose-Capillary: 115 mg/dL — ABNORMAL HIGH (ref 70–99)

## 2012-05-01 MED ORDER — POTASSIUM CHLORIDE CRYS ER 20 MEQ PO TBCR
40.0000 meq | EXTENDED_RELEASE_TABLET | Freq: Once | ORAL | Status: AC
  Administered 2012-05-01: 40 meq via ORAL
  Filled 2012-05-01: qty 2

## 2012-05-01 NOTE — Progress Notes (Signed)
[facility] PROGRESS NOTE  [name] WUJ:[id number] DOB: 1940/09/19 DOA: 04/28/2012 PCP: Gwynneth Aliment, MD  Assessment/Plan: Chronic respiratory failure/Tracheostomy status - Case manager to helped out with oxygen for home, tomorrow.  - She relates that she has no shortness of breath is unchanged.  Obesity (BMI 30-39.9):  - Counseling.   CKD (chronic kidney disease) stage 3, GFR 30-59 ml/min  - Her creatinine is better than baseline today is 2.0 continue current home medications.   Uncontrolled hypertension - improved controlled monitor.  Code Status: Presumed full code. Family Communication: Patient updated at bedside. Disposition Plan: Patient reports that she still does not have power available at home.  Patient is on chronic trach requiring continuous oxygen, which is dependent on continuous power supply.  Patient does not have a safe discharge at home.  Consultants:  None  Procedures:  None  Antibiotics:  None  HPI/Subjective: No specific concerns.   breathing at baseline  Objective: Filed Vitals:   05/01/12 0946 05/01/12 0951 05/01/12 1149 05/01/12 1259  BP:  165/66    Pulse:  67  73  Temp:  98.6 F (37 C)    TempSrc:  Oral    Resp:  18  16  Height:      Weight:      SpO2: 100% 100% 95% 97%    Intake/Output Summary (Last 24 hours) at 05/01/12 1320 Last data filed at 04/30/12 2036  Gross per 24 hour  Intake      0 ml  Output    300 ml  Net   -300 ml   Filed Weights   04/28/12 2025 04/29/12 2034 04/30/12 0526  Weight: 78.2 kg (172 lb 6.4 oz) 78.1 kg (172 lb 2.9 oz) 77.8 kg (171 lb 8.3 oz)    Exam: Physical Exam: General: Awake, Oriented, No acute distress. HEENT: EOMI, trach in place. Neck: Supple CV: S1 and S2 Lungs: Coarse breath sounds bilaterally, good air movement. Abdomen: Soft, Nontender, Nondistended, +bowel sounds. Ext: Good pulses. Trace edema.  Data Reviewed: Basic Metabolic Panel:  Recent Labs Lab  04/28/12 1610 05/01/12 0635  NA 142 141  K 3.6 3.4*  CL 105 104  CO2 27 30  GLUCOSE 71 92  BUN 31* 36*  CREATININE 2.09* 2.13*  CALCIUM 9.7 9.1   Liver Function Tests:  Recent Labs Lab 04/28/12 1610  AST 15  ALT 11  ALKPHOS 83  BILITOT 0.2*  PROT 6.8  ALBUMIN 3.1*   No results found for this basename: LIPASE, AMYLASE,  in the last 168 hours No results found for this basename: AMMONIA,  in the last 168 hours CBC:  Recent Labs Lab 04/28/12 1610 05/01/12 0635  WBC 8.8 7.8  NEUTROABS 5.8  --   HGB 11.2* 10.6*  HCT 36.3 34.3*  MCV 81.9 80.7  PLT 199 181   Cardiac Enzymes:  Recent Labs Lab 04/28/12 1611  TROPONINI <0.30   BNP (last 3 results)  Recent Labs  01/28/12 0540 02/14/12 2127 04/28/12 1611  PROBNP 8936.0* 10254.0* 3133.0*   CBG:  Recent Labs Lab 04/30/12 1227 04/30/12 1745 04/30/12 2154 05/01/12 0746 05/01/12 1156  GLUCAP 111* 80 77 93 115*    No results found for this or any previous visit (from the past 240 hour(s)).   Studies: No results found.  Scheduled Meds: . albuterol  2.5 mg Nebulization TID  . amLODipine  5 mg Oral Daily  . antiseptic oral rinse  1 application Mouth Rinse QID  . aspirin  81  mg Oral Daily  . carvedilol  3.125 mg Oral BID WC  . chlorhexidine  15 mL Mouth/Throat BID  . escitalopram  10 mg Oral Daily  . feeding supplement  30 mL Per Tube QID  . heparin  5,000 Units Subcutaneous Q8H  . hydrALAZINE  25 mg Oral TID  . insulin aspart  0-9 Units Subcutaneous TID WC  . ipratropium  0.5 mg Nebulization TID  . pantoprazole  40 mg Oral Daily  . potassium chloride  40 mEq Oral Once  . simvastatin  20 mg Oral q1800  . torsemide  20 mg Oral Daily   Continuous Infusions:   Principal Problem:   Chronic respiratory failure Active Problems:   Obesity (BMI 30-39.9)   CKD (chronic kidney disease) stage 3, GFR 30-59 ml/min   Type 2 diabetes mellitus with vascular disease   Tracheostomy status    Marinda Elk, MD  [facility] Pager [phone]. If 7PM-7AM, please contact night-coverage at [email or web address], password [facility] 05/01/2012, 1:20 PM  LOS: 3 days

## 2012-05-01 NOTE — Evaluation (Signed)
Occupational Therapy Evaluation Patient Details Name: [name] MRN: [id number] DOB: [birth year] Today's Date: 05/01/2012 Time: 1478-2956 OT Time Calculation (min): 17 min  OT Assessment / Plan / Recommendation Clinical Impression  This 72 y.o. female with h/o tracheostomy due to chronic respiratory failure.  Pt. with very limited participation with OT.  She does report that she was at [facility] until early last week when she discharged home with dtr.  It appears she was to receive HHOT, PT, but they had not had time to get started due to this admission.  Pt reports her goal is to regain independence, but would only sit EOB with OT, and refused all further activity, she did agree for OT to set goals and see pt while she is hospitalized, but did not commit to actually participating states "we'll see".  Anticipate pt is close to her baseline, however, this is very difficult to asses due to pt's limited participation and no family available.  She will need 24 hour assist/supervision at discharge due to decreased activity tolerance, and decreased independence with all ADLs and mobility per pt report.  Recommend HHOT, HHPT and 3-in-1 commode at discharge     OT Assessment  Patient needs continued OT Services    Follow Up Recommendations  Home health OT;Supervision/Assistance - 24 hour (HHPT)    Barriers to Discharge Other (comment) (no family present - pt needs 24 hour assist)    Equipment Recommendations  3 in 1 bedside comode    Recommendations for Other Services    Frequency  Min 2X/week    Precautions / Restrictions Precautions Precautions: Fall Precaution Comments: Trach collar with PMV and 35% FIO2 Restrictions Weight Bearing Restrictions: No       ADL  Eating/Feeding: Set up Where Assessed - Eating/Feeding: Bed level Grooming: Wash/dry hands;Wash/dry face;Teeth care;Supervision/safety Where Assessed - Grooming: Supine, head of bed up Upper Body Bathing: Maximal  assistance Where Assessed - Upper Body Bathing: Supported sitting Lower Body Bathing: +1 Total assistance Where Assessed - Lower Body Bathing: Supine, head of bed up;Rolling right and/or left Upper Body Dressing: Maximal assistance Where Assessed - Upper Body Dressing: Supported sitting;Unsupported sitting Lower Body Dressing: +1 Total assistance Where Assessed - Lower Body Dressing: Supine, head of bed up;Rolling right and/or left Toilet Transfer: +1 Total assistance (Pt refused) Transfers/Ambulation Related to ADLs: Pt refused sit to stand, transfers, or ambulation ADL Comments: Pt. reluctantly moved to EOB sitting, propped on Lt. elbow.  Pt sat statically x 10 mins independently on EOB, but refused further activity.  She did perform reaching each UE x 2, but refused after that.  She reports that she was just getting started with [facility] therapies when she was admitted.      OT Diagnosis: Generalized weakness  OT Problem List: Decreased strength;Decreased activity tolerance;Cardiopulmonary status limiting activity;Obesity OT Treatment Interventions: Self-care/ADL training;Therapeutic activities;DME and/or AE instruction;Patient/family education;Energy conservation   OT Goals Acute Rehab OT Goals OT Goal Formulation: With patient Time For Goal Achievement: 05/08/12 Potential to Achieve Goals: Good ADL Goals Pt Will Perform Upper Body Bathing: with supervision;Sitting, chair ADL Goal: Upper Body Bathing - Progress: Goal set today Pt Will Perform Lower Body Bathing: with min assist;Sit to stand from bed;Sit to stand from chair ADL Goal: Lower Body Bathing - Progress: Goal set today Pt Will Transfer to Toilet: with min assist;Ambulation;Comfort height toilet;Grab bars ADL Goal: Toilet Transfer - Progress: Goal set today Pt Will Perform Toileting - Clothing Manipulation: with min assist;Standing ADL Goal: Toileting -  Clothing Manipulation - Progress: Goal set today Additional ADL Goal #1: Pt  will actively participate in 20 mins of therapeutic activity with no more than one rest break ADL Goal: Additional Goal #1 - Progress: Goal set today  Visit Information  Last OT Received On: 05/01/12    Subjective Data  Subjective: "I'm not going to walk" Patient Stated Goal: "To be able to take care of my house, at least a little bit"   Prior Functioning     Home Living Lives With: Daughter Available Help at Discharge: Family;Available 24 hours/day (per pt report) Bathroom Toilet: Standard Home Adaptive Equipment: Straight cane;Walker - rolling;Hospital bed;Wheelchair - manual [phone]) Additional Comments: Pt reports she was supposed to get a 3-in-1 commode when she left [facility], but didn't receive it.  She reports she was at [facility] until the first of last week, and has been living with dtr since that time.  Pt. does not readily volunteer information and at times, does not answer questions Prior Function Level of Independence: Needs assistance Needs Assistance: Dressing;Bathing;Toileting;Meal Prep;Light Housekeeping;Gait Bath: Moderate Dressing: Moderate Toileting: Minimal Meal Prep: Total Light Housekeeping: Total Gait Assistance: Pt reports she ambulated into BR with SPC, but required w/c to travel from bedroom to kitchen Driving: No Comments: Pt reluctant to provide information Communication Communication: Passy-Muir valve;Tracheostomy Dominant Hand: Right         Vision/Perception     Cognition  Cognition Overall Cognitive Status: Difficult to assess Difficult to assess due to: Other (comment) (Pt only providing limited information, & limited participati) Arousal/Alertness: Awake/alert Orientation Level: Appears intact for tasks assessed Behavior During Session: Flat affect Cognition - Other Comments: Pt. answered only questions asked of her with prompting.  Pt at times refuses to answer questions, and does not volunteer information.  Unable to  asses if this is normal behavior, or if there is a component of cognitive deficit contributing    Extremity/Trunk Assessment Right Upper Extremity Assessment RUE ROM/Strength/Tone: Deficits RUE ROM/Strength/Tone Deficits: shoulder elevation ~95*.  Pt reports soreness in elbow and that Rt. UE feels heavy (would not participate in further testing) Left Upper Extremity Assessment LUE ROM/Strength/Tone: Deficits LUE ROM/Strength/Tone Deficits: AROM WFL.  Strength grossly 3-3+/5, but difficult to asses as pt. particiapted in minimal activity     Mobility Bed Mobility Bed Mobility: Supine to Sit;Sit to Supine;Scooting to HOB Supine to Sit: 5: Supervision;HOB elevated Sit to Supine: 5: Supervision;HOB elevated Scooting to [facility]: 5: Supervision     Exercise     Balance     End of Session    GO     Conarpe, Wendi M 05/01/2012, 12:14 PM

## 2012-05-01 NOTE — Clinical Social Work Psychosocial (Addendum)
    Clinical Social Work Department BRIEF PSYCHOSOCIAL ASSESSMENT 05/01/2012  Patient:  [name], [name]     Account Number:  [id number]     Admit date:  04/28/2012  Clinical Social Worker:  Hulan Fray  Date/Time:  05/01/2012 02:38 PM  Referred by:  Physician  Date Referred:  04/29/2012 Referred for  Transportation assistance   Other Referral:   Interview type:  Patient Other interview type:    PSYCHOSOCIAL DATA Living Status:  FAMILY Admitted from facility:   Level of care:   Primary support name:  Sunday Corn Primary support relationship to patient:  CHILD, ADULT Degree of support available:   supportive    CURRENT CONCERNS Current Concerns  Other - See comment   Other Concerns:   MD had consulted for transportation needs.    SOCIAL WORK ASSESSMENT / PLAN Clinical Social Worker received referral for transportation needs. CSW introduced self and explained reason for visit. Patient reported that her daughter, [name] will be transporting patient home at discharge. Patient gave permission for CSW to contact patient's daughter.    CSW called and left voice message for daughter to return call to confirm plan.    CSW will complete ambulance form if needed and will leave in shadow chart.   Assessment/plan status:  No Further Intervention Required Other assessment/ plan:    14:51pm CSW received call back from daughter and she reported that patient's chair will not fit in her car, so ambulance will need to be the mode of transportation. Daughter also expressed interest in obtaining a list of private duty agencies to leave in her room. CSW will provide and leave in patient's room.  Information/referral to community resources:   CSW will complete ambulance form if needed.    PATIENT'S/FAMILY'S RESPONSE TO PLAN OF CARE: Patient reported that daughter will transport patient home. CSW left voice message for daughter to confirm this plan.

## 2012-05-02 DIAGNOSIS — J9610 Chronic respiratory failure, unspecified whether with hypoxia or hypercapnia: Principal | ICD-10-CM

## 2012-05-02 LAB — GLUCOSE, CAPILLARY: Glucose-Capillary: 90 mg/dL (ref 70–99)

## 2012-05-02 NOTE — Progress Notes (Signed)
This nurse present in room for home-going instructions given by Dr. Radonna Ricker. Patient non-compliant with discharge today, requesting stay overnight. Patient appears agitated and is demanding home-going for tomorrow, not today. Awaiting further instructions from physician and case management. Charge nurse notified. Sherlyn Lees, RN.

## 2012-05-02 NOTE — Care Management Note (Signed)
  Page 2 of 2   05/02/2012     8:31:07 AM   CARE MANAGEMENT NOTE 05/02/2012  Patient:  [name], [name]   Account Number:  [id number]  Date Initiated:  04/29/2012  Documentation initiated by:  Vance Peper  Subjective/Objective Assessment:   72 yr old female with trach, oxygen dependent.     Action/Plan:   patient will discharge home when power restored. lives alone. CM spoke with patient.   Anticipated DC Date:  05/02/2012   Anticipated DC Plan:  HOME/SELF CARE      DC Planning Services  CM consult      Choice offered to / List presented to:             Status of service:  In process, will continue to follow Medicare Important Message given?   (If response is "NO", the following Medicare IM given date fields will be blank) Date Medicare IM given:   Date Additional Medicare IM given:    Discharge Disposition:    Per UR Regulation:    If discussed at Long Length of Stay Meetings, dates discussed:    Comments:   05-02-12 Late note 05-01-12  1638 IM form given and explained to patient . Patient refused to sign . Left copy with patient .  Ronny Flurry RN BSN [phone]   05-01-12 Discussed case with Jodene Nam at [facility] and Nicolasa Ducking , unsafe discharge due to patient having trach and potential need for suctioning . DR Robb Matar aware.  Bedside nurse aware.  Ronny Flurry RN BSN    05-01-12 [facility] can deliver a larger oxygen tank that will last 48 hours .  Patient not wanting to go home due to no power , explained to patient medical need to stay in the hospital . MD aware of patient's concerns .  MD discharging patient .  Justin with [facility] , reported that large oxygen tank was delivered to patient's home last Friday 04-28-12 , so she has enough oxygen already at home .   When asked , patient stated [facility] did deliver oxygen tank but it was small and she used it all , already . Then patient stated [facility] did not deliver any oxygen , her daughter called for a tank  and was told there was no delivery driver to deliver it.  [facility] willing to deliver another tank today , however, patient states her daughter is staying with a friend and is unable to be at home to accept delivery and to help her( patient ) once she gets home.  Ronny Flurry RN BSN [phone]    05-01-12 Patient ready for discharge , however, has home oxygen trach collar at 35 % , through [facility] . Patient has no power at home . Referral given to [facility] at [facility] . He is checking on home oxygen tanks etc for discharge.  Ronny Flurry RN BSN [phone]

## 2012-05-02 NOTE — Progress Notes (Addendum)
[facility] PROGRESS NOTE  [name] ZSW:[id number] DOB: [birth year] DOA: 04/28/2012 PCP: Gwynneth Aliment, MD  Assessment/Plan: Chronic respiratory failure/Tracheostomy status - Case manager to helped out with oxygen for home, tomorrow. Still no power at home. - She relates that she has no shortness of breath is unchanged.  Obesity (BMI 30-39.9):  - Patient has no medical reason to be in house. She relates she is not leaving until 3.11.2014 even if the power is on at her house. - We have called [employer] and they confirm they have power. - I have called the daughter Dow Adolph) in given her an update, she relates she will go by the house and confirm there is electricity. I have tried to call Sunday Corn with no answer.   CKD (chronic kidney disease) stage 3, GFR 30-59 ml/min  - Her creatinine is better than baseline today is 2.0 continue current home medications.   Uncontrolled hypertension - improved controlled monitor.  Code Status: Presumed full code. Family Communication: Patient updated at bedside. Disposition Plan: Patient reports that she still does not have power available at home.  Patient is on chronic trach requiring continuous oxygen.  Consultants:  None  Procedures:  None  Antibiotics:  None  HPI/Subjective: No specific concerns.   breathing at baseline  Objective: Filed Vitals:   05/02/12 0449 05/02/12 0532 05/02/12 0808 05/02/12 1250  BP:  167/71    Pulse: 64 66  66  Temp:  98.3 F (36.8 C)    TempSrc:  Oral    Resp: 18 18  18   Height:      Weight:      SpO2: 95% 98% 95% 99%    Intake/Output Summary (Last 24 hours) at 05/02/12 1253 Last data filed at 05/02/12 0947  Gross per 24 hour  Intake    720 ml  Output    700 ml  Net     20 ml   Filed Weights   04/28/12 2025 04/29/12 2034 04/30/12 0526  Weight: 78.2 kg (172 lb 6.4 oz) 78.1 kg (172 lb 2.9 oz) 77.8 kg (171 lb 8.3 oz)    Exam: Physical Exam: General:  Awake, Oriented, No acute distress. HEENT: EOMI, trach in place. Neck: Supple CV: S1 and S2 Lungs: Coarse breath sounds bilaterally, good air movement. Abdomen: Soft, Nontender, Nondistended, +bowel sounds. Ext: Good pulses. Trace edema.  Data Reviewed: Basic Metabolic Panel:  Recent Labs Lab 04/28/12 1610 05/01/12 0635  NA 142 141  K 3.6 3.4*  CL 105 104  CO2 27 30  GLUCOSE 71 92  BUN 31* 36*  CREATININE 2.09* 2.13*  CALCIUM 9.7 9.1   Liver Function Tests:  Recent Labs Lab 04/28/12 1610  AST 15  ALT 11  ALKPHOS 83  BILITOT 0.2*  PROT 6.8  ALBUMIN 3.1*   No results found for this basename: LIPASE, AMYLASE,  in the last 168 hours No results found for this basename: AMMONIA,  in the last 168 hours CBC:  Recent Labs Lab 04/28/12 1610 05/01/12 0635  WBC 8.8 7.8  NEUTROABS 5.8  --   HGB 11.2* 10.6*  HCT 36.3 34.3*  MCV 81.9 80.7  PLT 199 181   Cardiac Enzymes:  Recent Labs Lab 04/28/12 1611  TROPONINI <0.30   BNP (last 3 results)  Recent Labs  01/28/12 0540 02/14/12 2127 04/28/12 1611  PROBNP 8936.0* 10254.0* 3133.0*   CBG:  Recent Labs Lab 05/01/12 1156 05/01/12 1704 05/01/12 2116 05/02/12 0752 05/02/12 1154  GLUCAP 115*  86 89 90 100*    No results found for this or any previous visit (from the past 240 hour(s)).   Studies: No results found.  Scheduled Meds: . albuterol  2.5 mg Nebulization TID  . amLODipine  5 mg Oral Daily  . antiseptic oral rinse  1 application Mouth Rinse QID  . aspirin  81 mg Oral Daily  . carvedilol  3.125 mg Oral BID WC  . chlorhexidine  15 mL Mouth/Throat BID  . escitalopram  10 mg Oral Daily  . feeding supplement  30 mL Per Tube QID  . heparin  5,000 Units Subcutaneous Q8H  . hydrALAZINE  25 mg Oral TID  . insulin aspart  0-9 Units Subcutaneous TID WC  . ipratropium  0.5 mg Nebulization TID  . pantoprazole  40 mg Oral Daily  . simvastatin  20 mg Oral q1800  . torsemide  20 mg Oral Daily    Continuous Infusions:   Principal Problem:   Chronic respiratory failure Active Problems:   Obesity (BMI 30-39.9)   CKD (chronic kidney disease) stage 3, GFR 30-59 ml/min   Type 2 diabetes mellitus with vascular disease   Tracheostomy status    Marinda Elk, MD  [facility] Pager [phone]. If 7PM-7AM, please contact night-coverage at [email or web address], password [facility] 05/02/2012, 12:53 PM  LOS: 4 days

## 2012-05-03 DIAGNOSIS — J9620 Acute and chronic respiratory failure, unspecified whether with hypoxia or hypercapnia: Secondary | ICD-10-CM

## 2012-05-03 DIAGNOSIS — J449 Chronic obstructive pulmonary disease, unspecified: Secondary | ICD-10-CM

## 2012-05-03 DIAGNOSIS — J9600 Acute respiratory failure, unspecified whether with hypoxia or hypercapnia: Secondary | ICD-10-CM

## 2012-05-03 LAB — GLUCOSE, CAPILLARY: Glucose-Capillary: 91 mg/dL (ref 70–99)

## 2012-05-03 NOTE — Discharge Summary (Signed)
Physician Discharge Summary  Patient ID: [name] MRN: [id number] DOB/AGE: [birth year] 72 y.o.  Admit date: 04/28/2012 Discharge date: 05/03/2012  Primary Care Physician:  Gwynneth Aliment, MD  Discharge Diagnoses:    . Type 2 diabetes mellitus with vascular disease . Obesity (BMI 30-39.9) . Chronic respiratory failure with tracheostomy  . CKD (chronic kidney disease) stage 3, GFR 30-59 ml/min  Consults: None   Discharge Instructions: Follow up with your PCP within 2 weeks  Discharge Medications:   Medication List    TAKE these medications       acetaminophen 325 MG tablet  Commonly known as:  TYLENOL  Take 325 mg by mouth every 6 (six) hours as needed for fever (for mild pain).     amLODipine 10 MG tablet  Commonly known as:  NORVASC  Take 10 mg by mouth daily.     aspirin 81 MG chewable tablet  Chew 1 tablet (81 mg total) by mouth daily.     busPIRone 5 MG tablet  Commonly known as:  BUSPAR  Take 5 mg by mouth 2 (two) times daily.     carvedilol 3.125 MG tablet  Commonly known as:  COREG  Take 1 tablet (3.125 mg total) by mouth 2 (two) times daily with a meal.     chlorhexidine 0.12 % solution  Commonly known as:  PERIDEX  Use as directed 15 mLs in the mouth or throat 2 (two) times daily.     cloNIDine 0.1 MG tablet  Commonly known as:  CATAPRES  Take 0.1 mg by mouth 2 (two) times daily.     ferrous sulfate 325 (65 FE) MG EC tablet  Take 325 mg by mouth 2 (two) times daily.     guaiFENesin 600 MG 12 hr tablet  Commonly known as:  MUCINEX  Take 600 mg by mouth 2 (two) times daily.     hydrALAZINE 100 MG tablet  Commonly known as:  APRESOLINE  Take 100 mg by mouth 3 (three) times daily.     metoCLOPramide 5 MG tablet  Commonly known as:  REGLAN  Take 5 mg by mouth 4 (four) times daily.     oxycodone 5 MG capsule  Commonly known as:  OXY-IR  Take 5 mg by mouth every 6 (six) hours as needed (for moderate pain).     pantoprazole 20 MG  tablet  Commonly known as:  PROTONIX  Take 20 mg by mouth every evening.     pravastatin 40 MG tablet  Commonly known as:  PRAVACHOL  Take 40 mg by mouth daily.     sennosides-docusate sodium 8.6-50 MG tablet  Commonly known as:  SENOKOT-S  Take 1 tablet by mouth 2 (two) times daily as needed for constipation.     sertraline 100 MG tablet  Commonly known as:  ZOLOFT  Take 100 mg by mouth at bedtime.     torsemide 20 MG tablet  Commonly known as:  DEMADEX  Take 20 mg by mouth 2 (two) times daily.     vitamin C 500 MG tablet  Commonly known as:  ASCORBIC ACID  Take 500 mg by mouth daily.         Brief H and P: For complete details please refer to admission H and P, but in brief72 y.o. 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 She started feeling short of breath, She came here to the emergency room. In the emergency room she satting 100% on 4 L in no acute distress the rest.      Hospital Course:  Chronic respiratory failure/Tracheostomy status: Currently patient is not in any acute distress, at baseline, eating, she refused VQ scan during hospitalization. Currently she is hemodynamically stable with no tachycardia or tachypnea or shortness of breath, her O2 sats are 98% on FiO2 of 40%, Respiratory rate of 16. Patient had been stable for discharge for last 24hours and it is confirmed that she has the power in the house. [employer] was apparently called yesterday and they confirmed that she has the power.  Obesity (BMI 30-39.9): counseled CKD (chronic kidney disease) stage 3, GFR 30-59 ml/min: Stable, continue home medications   Day of Discharge BP 166/75  Pulse 71  Temp(Src) 98.2 F (36.8 C) (Oral)  Resp 16  Ht 5\' 4"  (1.626 m)  Wt 77.8 kg (171  lb 8.3 oz)  BMI 29.43 kg/m2  SpO2 98%  Physical Exam: General: Alert and awake oriented x3 not in any acute distress, trach in place. CVS: S1-S2 clear no murmur rubs or gallops Chest: clear to auscultation bilaterally, no wheezing rales or rhonchi Abdomen: soft nontender, nondistended, normal bowel sounds, no organomegaly Extremities: no cyanosis, clubbing or edema noted bilaterally Neuro: Cranial nerves II-XII intact, no focal neurological deficits   The results of significant diagnostics from this hospitalization (including imaging, microbiology, ancillary and laboratory) are listed below for reference.    LAB RESULTS: Basic Metabolic Panel:  Recent Labs Lab 04/28/12 1610 05/01/12 0635  NA 142 141  K 3.6 3.4*  CL 105 104  CO2 27 30  GLUCOSE 71 92  BUN 31* 36*  CREATININE 2.09* 2.13*  CALCIUM 9.7 9.1   Liver Function Tests:  Recent Labs Lab 04/28/12 1610  AST 15  ALT 11  ALKPHOS 83  BILITOT 0.2*  PROT 6.8  ALBUMIN 3.1*   CBC:  Recent Labs Lab 04/28/12 1610 05/01/12 0635  WBC 8.8 7.8  NEUTROABS 5.8  --   HGB 11.2* 10.6*  HCT 36.3 34.3*  MCV 81.9 80.7  PLT 199 181   Cardiac Enzymes:  Recent Labs Lab 04/28/12 1611  TROPONINI <0.30   CBG:  Recent Labs Lab 05/02/12 2232 05/03/12 0750  GLUCAP 121* 91    Significant Diagnostic Studies:  Dg Chest 2 View  04/28/2012  *RADIOLOGY REPORT*  Clinical Data: Chronic cough, hypertension, shortness of breath  CHEST - 2 VIEW  Comparison: 03/17/2012  Findings: Cardiomegaly noted with central vascular congestion. Chronic left midlung scarring.  No definite superimposed CHF or pneumonia.  No large effusion or pneumothorax.  Tracheostomy in the mid trachea.  Atherosclerotic changes of the aorta.  IMPRESSION: Stable cardiomegaly and vascular congestion.  Chronic left midlung scarring  No superimposed CHF or pneumonia   Original Report Authenticated By: Judie Petit. Miles Costain, M.D.        Disposition and Follow-up:      Discharge Orders   Future Orders Complete By Expires     Diet - low sodium heart healthy  As directed           Increase activity slowly  As directed               DISPOSITION: Home  DIET: Heart healthy diet  ACTIVITY: As tolerated    DISCHARGE FOLLOW-UP Follow-up Information  Follow up with Gwynneth Aliment, MD In 2 weeks.   Contact information:   [address] [place] [address] [phone]       Time spent on Discharge: 35 mins  Signed:   RAI,RIPUDEEP M.D. [facility] 05/03/2012, 10:12 AM Pager: [phone]

## 2012-05-03 NOTE — Clinical Social Work Note (Addendum)
CSW received call from EMS reporting that patient gave them a different address of [address] [place], [place]. CSW called [name] to confirm address, but had to leave a message. Patient's daughter brought patient a key to house earlier today.   16:00pm CSW received another call from EMS and reported that they transported patient to [address], [place], [place].   Rozetta Nunnery MSW, [employer] [phone]

## 2012-05-03 NOTE — Progress Notes (Signed)
Discharge instructions gone over with patient. Home medications gone over with patient. Patient stated she does have power and oxygen at home. Diet and activity discussed with patient. Patient stated not to add table salt to food and to purchase foods containing low sodium when possible. She is to schedule a follow up appointment with Dr. Dorothyann Peng. Patient's daughter brought house key to room , as patient is being transported home by ambulance. Home medications gone over with the patient. Patient stated understanding of directions.

## 2012-05-03 NOTE — Progress Notes (Signed)
Changed aerosol bottle on atc

## 2012-05-15 ENCOUNTER — Encounter (HOSPITAL_COMMUNITY): Payer: Self-pay | Admitting: Emergency Medicine

## 2012-05-15 ENCOUNTER — Emergency Department (HOSPITAL_COMMUNITY): Payer: Medicare Other

## 2012-05-15 ENCOUNTER — Inpatient Hospital Stay (HOSPITAL_COMMUNITY)
Admission: EM | Admit: 2012-05-15 | Discharge: 2012-05-19 | DRG: 189 | Disposition: A | Discharge status: Home / Self Care | Payer: Medicare Other | Attending: Internal Medicine | Admitting: Internal Medicine

## 2012-05-15 DIAGNOSIS — L0390 Cellulitis, unspecified: Secondary | ICD-10-CM

## 2012-05-15 DIAGNOSIS — J45909 Unspecified asthma, uncomplicated: Secondary | ICD-10-CM

## 2012-05-15 DIAGNOSIS — M109 Gout, unspecified: Secondary | ICD-10-CM

## 2012-05-15 DIAGNOSIS — J9500 Unspecified tracheostomy complication: Secondary | ICD-10-CM

## 2012-05-15 DIAGNOSIS — M7989 Other specified soft tissue disorders: Secondary | ICD-10-CM

## 2012-05-15 DIAGNOSIS — I498 Other specified cardiac arrhythmias: Secondary | ICD-10-CM | POA: Diagnosis present

## 2012-05-15 DIAGNOSIS — I5021 Acute systolic (congestive) heart failure: Secondary | ICD-10-CM

## 2012-05-15 DIAGNOSIS — I5033 Acute on chronic diastolic (congestive) heart failure: Secondary | ICD-10-CM | POA: Diagnosis present

## 2012-05-15 DIAGNOSIS — M129 Arthropathy, unspecified: Secondary | ICD-10-CM | POA: Diagnosis present

## 2012-05-15 DIAGNOSIS — I2510 Atherosclerotic heart disease of native coronary artery without angina pectoris: Secondary | ICD-10-CM

## 2012-05-15 DIAGNOSIS — F17200 Nicotine dependence, unspecified, uncomplicated: Secondary | ICD-10-CM

## 2012-05-15 DIAGNOSIS — Z7401 Bed confinement status: Secondary | ICD-10-CM

## 2012-05-15 DIAGNOSIS — E669 Obesity, unspecified: Secondary | ICD-10-CM

## 2012-05-15 DIAGNOSIS — E119 Type 2 diabetes mellitus without complications: Secondary | ICD-10-CM | POA: Diagnosis present

## 2012-05-15 DIAGNOSIS — G9340 Encephalopathy, unspecified: Secondary | ICD-10-CM

## 2012-05-15 DIAGNOSIS — Z8249 Family history of ischemic heart disease and other diseases of the circulatory system: Secondary | ICD-10-CM

## 2012-05-15 DIAGNOSIS — I429 Cardiomyopathy, unspecified: Secondary | ICD-10-CM

## 2012-05-15 DIAGNOSIS — I452 Bifascicular block: Secondary | ICD-10-CM

## 2012-05-15 DIAGNOSIS — K219 Gastro-esophageal reflux disease without esophagitis: Secondary | ICD-10-CM | POA: Diagnosis present

## 2012-05-15 DIAGNOSIS — G4733 Obstructive sleep apnea (adult) (pediatric): Secondary | ICD-10-CM

## 2012-05-15 DIAGNOSIS — Z930 Tracheostomy status: Secondary | ICD-10-CM

## 2012-05-15 DIAGNOSIS — N179 Acute kidney failure, unspecified: Secondary | ICD-10-CM

## 2012-05-15 DIAGNOSIS — J9602 Acute respiratory failure with hypercapnia: Secondary | ICD-10-CM

## 2012-05-15 DIAGNOSIS — Z8674 Personal history of sudden cardiac arrest: Secondary | ICD-10-CM

## 2012-05-15 DIAGNOSIS — D638 Anemia in other chronic diseases classified elsewhere: Secondary | ICD-10-CM | POA: Diagnosis present

## 2012-05-15 DIAGNOSIS — I469 Cardiac arrest, cause unspecified: Secondary | ICD-10-CM

## 2012-05-15 DIAGNOSIS — I428 Other cardiomyopathies: Secondary | ICD-10-CM | POA: Diagnosis present

## 2012-05-15 DIAGNOSIS — E662 Morbid (severe) obesity with alveolar hypoventilation: Secondary | ICD-10-CM | POA: Diagnosis present

## 2012-05-15 DIAGNOSIS — I798 Other disorders of arteries, arterioles and capillaries in diseases classified elsewhere: Secondary | ICD-10-CM

## 2012-05-15 DIAGNOSIS — J449 Chronic obstructive pulmonary disease, unspecified: Secondary | ICD-10-CM | POA: Diagnosis present

## 2012-05-15 DIAGNOSIS — L03311 Cellulitis of abdominal wall: Secondary | ICD-10-CM

## 2012-05-15 DIAGNOSIS — J989 Respiratory disorder, unspecified: Secondary | ICD-10-CM

## 2012-05-15 DIAGNOSIS — Z823 Family history of stroke: Secondary | ICD-10-CM

## 2012-05-15 DIAGNOSIS — E1159 Type 2 diabetes mellitus with other circulatory complications: Secondary | ICD-10-CM

## 2012-05-15 DIAGNOSIS — J9620 Acute and chronic respiratory failure, unspecified whether with hypoxia or hypercapnia: Principal | ICD-10-CM | POA: Diagnosis present

## 2012-05-15 DIAGNOSIS — J9610 Chronic respiratory failure, unspecified whether with hypoxia or hypercapnia: Secondary | ICD-10-CM

## 2012-05-15 DIAGNOSIS — I2789 Other specified pulmonary heart diseases: Secondary | ICD-10-CM

## 2012-05-15 DIAGNOSIS — I509 Heart failure, unspecified: Secondary | ICD-10-CM | POA: Diagnosis present

## 2012-05-15 DIAGNOSIS — I5023 Acute on chronic systolic (congestive) heart failure: Secondary | ICD-10-CM

## 2012-05-15 DIAGNOSIS — I130 Hypertensive heart and chronic kidney disease with heart failure and stage 1 through stage 4 chronic kidney disease, or unspecified chronic kidney disease: Secondary | ICD-10-CM | POA: Diagnosis present

## 2012-05-15 DIAGNOSIS — E785 Hyperlipidemia, unspecified: Secondary | ICD-10-CM

## 2012-05-15 DIAGNOSIS — D649 Anemia, unspecified: Secondary | ICD-10-CM

## 2012-05-15 DIAGNOSIS — I110 Hypertensive heart disease with heart failure: Secondary | ICD-10-CM

## 2012-05-15 DIAGNOSIS — J189 Pneumonia, unspecified organism: Secondary | ICD-10-CM

## 2012-05-15 DIAGNOSIS — I5032 Chronic diastolic (congestive) heart failure: Secondary | ICD-10-CM | POA: Diagnosis present

## 2012-05-15 DIAGNOSIS — N184 Chronic kidney disease, stage 4 (severe): Secondary | ICD-10-CM

## 2012-05-15 DIAGNOSIS — R9431 Abnormal electrocardiogram [ECG] [EKG]: Secondary | ICD-10-CM | POA: Diagnosis present

## 2012-05-15 DIAGNOSIS — Z9981 Dependence on supplemental oxygen: Secondary | ICD-10-CM

## 2012-05-15 DIAGNOSIS — J4489 Other specified chronic obstructive pulmonary disease: Secondary | ICD-10-CM | POA: Diagnosis present

## 2012-05-15 DIAGNOSIS — I279 Pulmonary heart disease, unspecified: Secondary | ICD-10-CM | POA: Diagnosis present

## 2012-05-15 DIAGNOSIS — K7460 Unspecified cirrhosis of liver: Secondary | ICD-10-CM

## 2012-05-15 LAB — TROPONIN I
Troponin I: <0.3 ng/mL (ref <0.30)
Troponin I: <0.3 ng/mL (ref <0.30)

## 2012-05-15 LAB — BASIC METABOLIC PANEL
BUN: 45 mg/dL — ABNORMAL HIGH (ref 6–23)
Creatinine, Ser: 2.6 mg/dL — ABNORMAL HIGH (ref 0.50–1.10)
GFR calc Af Amer: 20 mL/min — ABNORMAL LOW (ref >90)
GFR calc non Af Amer: 17 mL/min — ABNORMAL LOW (ref >90)
Glucose, Bld: 103 mg/dL — ABNORMAL HIGH (ref 70–99)

## 2012-05-15 LAB — CBC WITH DIFFERENTIAL/PLATELET
Basophils Relative: 0 % (ref 0–1)
Eosinophils Absolute: 0.1 10*3/uL (ref 0.0–0.7)
Eosinophils Relative: 2 % (ref 0–5)
HCT: 34.4 % — ABNORMAL LOW (ref 36.0–46.0)
Hemoglobin: 10.7 g/dL — ABNORMAL LOW (ref 12.0–15.0)
Lymphs Abs: 1.5 10*3/uL (ref 0.7–4.0)
MCH: 24.9 pg — ABNORMAL LOW (ref 26.0–34.0)
MCHC: 31.1 g/dL (ref 30.0–36.0)
MCV: 80 fL (ref 78.0–100.0)
Monocytes Absolute: 0.7 10*3/uL (ref 0.1–1.0)
Monocytes Relative: 10 % (ref 3–12)

## 2012-05-15 LAB — GLUCOSE, CAPILLARY

## 2012-05-15 LAB — CREATININE, SERUM: GFR calc Af Amer: 21 mL/min — ABNORMAL LOW (ref >90)

## 2012-05-15 MED ORDER — HYDRALAZINE HCL 50 MG PO TABS
100.0000 mg | ORAL_TABLET | Freq: Three times a day (TID) | ORAL | Status: DC
  Administered 2012-05-15 – 2012-05-19 (×14): 100 mg via ORAL
  Filled 2012-05-15 (×14): qty 2

## 2012-05-15 MED ORDER — IPRATROPIUM BROMIDE 0.02 % IN SOLN
0.5000 mg | RESPIRATORY_TRACT | Status: DC
  Administered 2012-05-15 – 2012-05-16 (×10): 0.5 mg via RESPIRATORY_TRACT
  Filled 2012-05-15 (×10): qty 2.5

## 2012-05-15 MED ORDER — ONDANSETRON HCL 4 MG/2ML IJ SOLN: 4.0000 mg | Freq: Three times a day (TID) | INTRAMUSCULAR | Status: AC | PRN

## 2012-05-15 MED ORDER — BUSPIRONE HCL 5 MG PO TABS
5.0000 mg | ORAL_TABLET | Freq: Two times a day (BID) | ORAL | Status: DC
  Administered 2012-05-15 – 2012-05-19 (×10): 5 mg via ORAL
  Filled 2012-05-15 (×10): qty 1

## 2012-05-15 MED ORDER — SIMVASTATIN 20 MG PO TABS
20.0000 mg | ORAL_TABLET | Freq: Every day | ORAL | Status: DC
  Administered 2012-05-15 – 2012-05-19 (×5): 20 mg via ORAL
  Filled 2012-05-15 (×5): qty 1

## 2012-05-15 MED ORDER — FERROUS SULFATE 325 (65 FE) MG PO TBEC
325.0000 mg | DELAYED_RELEASE_TABLET | Freq: Two times a day (BID) | ORAL | Status: DC
  Administered 2012-05-16 – 2012-05-19 (×8): 325 mg via ORAL
  Filled 2012-05-15 (×10): qty 1

## 2012-05-15 MED ORDER — ALBUTEROL SULFATE (5 MG/ML) 0.5% IN NEBU
2.5000 mg | INHALATION_SOLUTION | RESPIRATORY_TRACT | Status: AC
  Administered 2012-05-15: 2.5 mg via RESPIRATORY_TRACT
  Filled 2012-05-15 (×2): qty 0.5

## 2012-05-15 MED ORDER — ALBUTEROL SULFATE (5 MG/ML) 0.5% IN NEBU
5.0000 mg | INHALATION_SOLUTION | RESPIRATORY_TRACT | Status: DC
  Administered 2012-05-15 (×6): 5 mg via RESPIRATORY_TRACT
  Administered 2012-05-16: 2.5 mg via RESPIRATORY_TRACT
  Administered 2012-05-16 (×2): 5 mg via RESPIRATORY_TRACT
  Administered 2012-05-16: 2.5 mg via RESPIRATORY_TRACT
  Filled 2012-05-15 (×2): qty 0.5
  Filled 2012-05-15: qty 1
  Filled 2012-05-15 (×2): qty 0.5
  Filled 2012-05-15 (×4): qty 1
  Filled 2012-05-15: qty 0.5

## 2012-05-15 MED ORDER — SENNA-DOCUSATE SODIUM 8.6-50 MG PO TABS
1.0000 | ORAL_TABLET | Freq: Two times a day (BID) | ORAL | Status: DC | PRN
  Filled 2012-05-15: qty 1

## 2012-05-15 MED ORDER — FUROSEMIDE 10 MG/ML IJ SOLN
60.0000 mg | Freq: Once | INTRAMUSCULAR | Status: AC
  Administered 2012-05-15: 60 mg via INTRAVENOUS
  Filled 2012-05-15: qty 6

## 2012-05-15 MED ORDER — ONDANSETRON HCL 4 MG/2ML IJ SOLN: 4.0000 mg | Freq: Four times a day (QID) | INTRAMUSCULAR | Status: DC | PRN

## 2012-05-15 MED ORDER — SODIUM CHLORIDE 0.9 % IJ SOLN
3.0000 mL | Freq: Two times a day (BID) | INTRAMUSCULAR | Status: DC
  Administered 2012-05-16 – 2012-05-19 (×6): 3 mL via INTRAVENOUS

## 2012-05-15 MED ORDER — ENOXAPARIN SODIUM 30 MG/0.3ML ~~LOC~~ SOLN
30.0000 mg | SUBCUTANEOUS | Status: DC
  Administered 2012-05-15 – 2012-05-19 (×5): 30 mg via SUBCUTANEOUS
  Filled 2012-05-15 (×5): qty 0.3

## 2012-05-15 MED ORDER — ONDANSETRON HCL 4 MG PO TABS: 4.0000 mg | ORAL_TABLET | Freq: Four times a day (QID) | ORAL | Status: DC | PRN

## 2012-05-15 MED ORDER — SERTRALINE HCL 100 MG PO TABS
100.0000 mg | ORAL_TABLET | Freq: Every day | ORAL | Status: DC
  Administered 2012-05-15 – 2012-05-19 (×5): 100 mg via ORAL
  Filled 2012-05-15 (×7): qty 1

## 2012-05-15 MED ORDER — TORSEMIDE 20 MG PO TABS
20.0000 mg | ORAL_TABLET | Freq: Two times a day (BID) | ORAL | Status: DC
  Administered 2012-05-15 – 2012-05-16 (×2): 20 mg via ORAL
  Filled 2012-05-15 (×4): qty 1

## 2012-05-15 MED ORDER — ASPIRIN 81 MG PO CHEW
81.0000 mg | CHEWABLE_TABLET | Freq: Every day | ORAL | Status: DC
  Administered 2012-05-15 – 2012-05-19 (×5): 81 mg via ORAL
  Filled 2012-05-15 (×5): qty 1

## 2012-05-15 MED ORDER — CLONIDINE HCL 0.1 MG PO TABS
0.1000 mg | ORAL_TABLET | Freq: Two times a day (BID) | ORAL | Status: DC
  Administered 2012-05-15 – 2012-05-19 (×10): 0.1 mg via ORAL
  Filled 2012-05-15 (×10): qty 1

## 2012-05-15 MED ORDER — AMLODIPINE BESYLATE 10 MG PO TABS
10.0000 mg | ORAL_TABLET | Freq: Every day | ORAL | Status: DC
  Administered 2012-05-15 – 2012-05-19 (×5): 10 mg via ORAL
  Filled 2012-05-15 (×5): qty 1

## 2012-05-15 MED ORDER — OXYCODONE HCL 5 MG PO TABS
5.0000 mg | ORAL_TABLET | Freq: Four times a day (QID) | ORAL | Status: DC | PRN
  Administered 2012-05-16 – 2012-05-17 (×2): 5 mg via ORAL
  Filled 2012-05-15 (×2): qty 1

## 2012-05-15 MED ORDER — CARVEDILOL 3.125 MG PO TABS
3.1250 mg | ORAL_TABLET | Freq: Two times a day (BID) | ORAL | Status: DC
  Administered 2012-05-15 – 2012-05-16 (×2): 3.125 mg via ORAL
  Filled 2012-05-15 (×4): qty 1

## 2012-05-15 MED ORDER — HYDROCODONE-ACETAMINOPHEN 5-325 MG PO TABS
1.0000 | ORAL_TABLET | Freq: Once | ORAL | Status: AC
  Administered 2012-05-15: 1 via ORAL
  Filled 2012-05-15: qty 1

## 2012-05-15 MED ORDER — METOCLOPRAMIDE HCL 5 MG PO TABS
5.0000 mg | ORAL_TABLET | Freq: Three times a day (TID) | ORAL | Status: DC
  Administered 2012-05-15 – 2012-05-19 (×19): 5 mg via ORAL
  Filled 2012-05-15 (×20): qty 1

## 2012-05-15 MED ORDER — PANTOPRAZOLE SODIUM 20 MG PO TBEC
20.0000 mg | DELAYED_RELEASE_TABLET | Freq: Every day | ORAL | Status: DC
  Administered 2012-05-15 – 2012-05-19 (×5): 20 mg via ORAL
  Filled 2012-05-15 (×5): qty 1

## 2012-05-15 NOTE — Progress Notes (Signed)
Utilization review completed.  

## 2012-05-15 NOTE — ED Notes (Signed)
Phlebotomy at the bedside  

## 2012-05-15 NOTE — Consult Note (Signed)
Cardiology Consult Note   Patient ID: [name] MRN: [id number], DOB/AGE: 08/17/1940   Admit date: 05/15/2012 Date of Consult: 05/15/2012  Primary Physician: Gwynneth Aliment, MD Primary Cardiologist: Golden Circle, MD  Reason for consult: shortness of breath, new EKG changes  HPI: [name] is a 72 y.o. 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    RHC 09/13/11: mild pulmonary hypertension, otherwise normal right heart pressures  2D echo 01/28/12: EF 45-50%, grade 1 diastolic dysfunction, severe LVH, diffuse HK, mild AS (mean grad 10 mmHg, peak 20), trivial MR, moderate biatrial enlargement, normal RV size and function   She has been admitted 6 times in the past 6 months for similar issues. She has been seen by the heart failure team last fall. At that time, pBNP was markedly elevated and she was massively volume overloaded. She underwent RHC showing only mild pulmonary HTN believed to be attributed to her chronic pulmonary disease. She was diuresed with symptom improvement, since then she has been treated for her underlying, progressively worsening pulmonary status. She had an asystolic cardiac arrest in 1/14 in the setting of A/C resp failure. Ischemic work-up has been deferred due to worsening CKD.   She reports feeling more acutely short of breath over the past day or two. She denies chest pain. She does note mildly increased LE edema and cough productive of white/yellow sputum. No PND or orthopnea. She does report a steady weight loss (baseline weight 172 lbs)  but does not weigh herself daily. She lives with her daughter now. She states she watches her salt intake.   She presented to the ED where EKG revealed IVCD- RBBB, LAFB, TWIs II, III, aVF and RAD. When compared to prior tracings, LAD with TWIs in I, aVL is noted along with IVCDs. Initial trop-I WNL. pBNP 1451 (trend since 01/2012: [phone]). Cr 2.6. H/H 107/34.4. Port CXR reveals tracheostomy, chronic left hemidiaphragm elevation and atelectasis. Patient's daughter and ED documentation suggests a significant amount of mucus was aspirated from her tracheostomy.   Problem List: Past Medical History  Diagnosis Date  . COPD (chronic obstructive pulmonary disease)     on home o2  . Chronic renal insufficiency   . CVA (cerebral infarction)     hx  . Hypertensive heart disease with congestive heart failure   . Chronic cor pulmonale   . Cardiomyopathy of undetermined type 09/07/2011  . Chronic kidney disease (CKD), stage IV (severe)   . Hyperlipdemia   . Obesity (BMI 30-39.9)   . Sleep apnea   . History of gout 09/07/2011  . Heart failure 7/13  . Shortness of breath   . Anemia   . Vascular disease   . Asthma   . Anginal pain   . Hypertension   . Type II or unspecified type diabetes mellitus without mention of complication, not stated as uncontrolled   . Stroke   . GERD (gastroesophageal reflux disease)   . Arthritis   . Bundle branch block   . Thrombocytopenia   . Acute and chronic respiratory failure     Past Surgical History  Procedure Laterality Date  . Bilateral oophorectomy    . Cardiac catheterization      right heart cath  . Laparoscopic gastrostomy  12/21/2011    Procedure: LAPAROSCOPIC GASTROSTOMY;  Surgeon: Axel Filler, MD;  Location: [facility] OR;  Service: General;  Laterality: N/A;  laparoscopic gastrostomy possible open feeding tube  . Abdominal hysterectomy    . Tracheostomy  11/2011  . Cataracts    . Tracheostomy tube placement  02/18/2012    Procedure:  TRACHEOSTOMY;  Surgeon: Serena Colonel, MD;  Location[facility] OR;  Service: ENT;  Laterality: N/A;     Allergies:  Allergies  Allergen Reactions  . Sulfonamide Derivatives Hives and Rash    Home Medications: Prior to Admission medications   Medication Sig Start Date End Date Taking? Authorizing Provider  amLODipine (NORVASC) 10 MG tablet Take 10 mg by mouth daily.   Yes Historical Provider, MD  aspirin 81 MG chewable tablet Chew 1 tablet (81 mg total) by mouth daily. 02/19/12  Yes Simonne Martinet, NP  busPIRone (BUSPAR) 5 MG tablet Take 5 mg by mouth 2 (two) times daily.   Yes Historical Provider, MD  carvedilol (COREG) 3.125 MG tablet Take 1 tablet (3.125 mg total) by mouth 2 (two) times daily with a meal. 02/19/12  Yes Simonne Martinet, NP  chlorhexidine (PERIDEX) 0.12 % solution Use as directed 15 mLs in the mouth or throat 2 (two) times daily. 02/19/12  Yes Simonne Martinet, NP  cloNIDine (CATAPRES) 0.1 MG tablet Take 0.1 mg by mouth 2 (two) times daily.   Yes Historical Provider, MD  ferrous sulfate 325 (65 FE) MG EC tablet Take 325 mg by mouth 2 (two) times daily.   Yes Historical Provider, MD  hydrALAZINE (APRESOLINE) 100 MG tablet Take 100 mg by mouth 3 (three) times daily.   Yes Historical Provider, MD  metoCLOPramide (REGLAN) 5 MG tablet Take 5 mg by mouth 4 (four) times daily.   Yes Historical Provider, MD  oxycodone (OXY-IR) 5 MG capsule Take 5 mg by mouth every 6 (six) hours as needed (for moderate pain).   Yes Historical Provider, MD  pantoprazole (PROTONIX) 20 MG tablet Take 20 mg by mouth every evening.   Yes Historical Provider, MD  pravastatin (PRAVACHOL) 40 MG tablet Take 40 mg by mouth daily.   Yes Historical Provider, MD  sennosides-docusate sodium (SENOKOT-S) 8.6-50 MG tablet Take 1 tablet by mouth 2 (two) times daily as needed for constipation.   Yes Historical Provider, MD  sertraline (ZOLOFT) 100 MG tablet Take 100 mg by mouth at bedtime.   Yes Historical Provider, MD    torsemide (DEMADEX) 20 MG tablet Take 20 mg by mouth 2 (two) times daily.   Yes Historical Provider, MD    Inpatient Medications:  . albuterol  2.5 mg Nebulization Q4H  . ipratropium  0.5 mg Nebulization Q4H   And  . albuterol  5 mg Nebulization Q4H  . amLODipine  10 mg Oral Daily  . aspirin  81 mg Oral Daily  . busPIRone  5 mg Oral BID  . carvedilol  3.125 mg Oral BID WC  . cloNIDine  0.1 mg Oral BID  . enoxaparin (LOVENOX) injection  30 mg Subcutaneous Q24H  . [START ON 05/16/2012] ferrous sulfate  325 mg Oral BID  . hydrALAZINE  100 mg Oral TID  . metoCLOPramide  5 mg Oral TID AC & HS  . pantoprazole  20 mg Oral QHS  . sertraline  100 mg Oral QHS  .  simvastatin  20 mg Oral q1800  . sodium chloride  3 mL Intravenous Q12H  . torsemide  20 mg Oral BID   Prescriptions prior to admission  Medication Sig Dispense Refill  . amLODipine (NORVASC) 10 MG tablet Take 10 mg by mouth daily.      Marland Kitchen aspirin 81 MG chewable tablet Chew 1 tablet (81 mg total) by mouth daily.      . busPIRone (BUSPAR) 5 MG tablet Take 5 mg by mouth 2 (two) times daily.      . carvedilol (COREG) 3.125 MG tablet Take 1 tablet (3.125 mg total) by mouth 2 (two) times daily with a meal.      . chlorhexidine (PERIDEX) 0.12 % solution Use as directed 15 mLs in the mouth or throat 2 (two) times daily.  120 mL    . cloNIDine (CATAPRES) 0.1 MG tablet Take 0.1 mg by mouth 2 (two) times daily.      . ferrous sulfate 325 (65 FE) MG EC tablet Take 325 mg by mouth 2 (two) times daily.      . hydrALAZINE (APRESOLINE) 100 MG tablet Take 100 mg by mouth 3 (three) times daily.      . metoCLOPramide (REGLAN) 5 MG tablet Take 5 mg by mouth 4 (four) times daily.      Marland Kitchen oxycodone (OXY-IR) 5 MG capsule Take 5 mg by mouth every 6 (six) hours as needed (for moderate pain).      . pantoprazole (PROTONIX) 20 MG tablet Take 20 mg by mouth every evening.      . pravastatin (PRAVACHOL) 40 MG tablet Take 40 mg by mouth daily.      .  sennosides-docusate sodium (SENOKOT-S) 8.6-50 MG tablet Take 1 tablet by mouth 2 (two) times daily as needed for constipation.      . sertraline (ZOLOFT) 100 MG tablet Take 100 mg by mouth at bedtime.      . torsemide (DEMADEX) 20 MG tablet Take 20 mg by mouth 2 (two) times daily.        Family History  Problem Relation Age of Onset  . Heart attack Father   . Stroke Mother     CVA  . Coronary artery disease Daughter      History   Social History  . Marital Status: Widowed    Spouse Name: N/A    Number of Children: 1  . Years of Education: N/A   Occupational History  . Not on file.   Social History Main Topics  . Smoking status: Former Smoker -- 1.00 packs/day for 55 years    Types: Cigarettes    Quit date: 08/28/2011  . Smokeless tobacco: Never Used  . Alcohol Use: No  . Drug Use: No  . Sexually Active: No   Other Topics Concern  . Not on file   Social History Narrative   Widow,  lives alone     Review of Systems: General: positive for weight loss, negative for chills, fever, night sweats Cardiovascular: positive for edema, negative for chest pain, dyspnea on exertion, orthopnea, palpitations, Dermatological: negative for rash Respiratory: positive for shortness of breath and new productive cough, negative for wheezing Urologic: negative for hematuria Abdominal: negative for nausea, vomiting, diarrhea, bright red blood per rectum, melena, or hematemesis Neurologic: negative for visual changes, syncope, or dizziness All other systems reviewed and are otherwise negative except as noted above.  Physical Exam: Blood pressure 147/60, pulse 74, temperature 97.5 F (36.4 C), temperature source Oral, resp. rate  27, SpO2 95.00%.    General: Elderly, in no acute distress. Head: Normocephalic, atraumatic, sclera non-icteric, no xanthomas, nares are without discharge.  Neck: Negative for carotid bruits. JVD not elevated. Lungs: Bilateral centralized rhonchi which change  in quality with cough, no appreciable rales or rhonch. Decreased breath sounds to left middle and inferior posterior lung fields. Breathing is unlabored. Heart: RRR with S1 S2, II-III/VI systolic murmur at RUSB/RLSB. No rubs or gallops appreciated. Abdomen: Soft, non-tender, non-distended with normoactive bowel sounds. No hepatomegaly. No rebound/guarding. No obvious abdominal masses. Msk:  Strength and tone appears normal for age. Extremities: Trace bilateral pretibial nonpitting edema. No clubbing or cyanosis.  Distal pedal pulses are 1+ and equal bilaterally. Neuro: Alert and oriented X 3. Moves all extremities spontaneously. Psych:  Responds to questions appropriately with a normal affect.  Labs: Recent Labs     05/15/12  0131  WBC  6.8  HGB  10.7*  HCT  34.4*  MCV  80.0  PLT  169   Recent Labs Lab 05/15/12 0131 05/15/12 1138  NA 144  --   K 3.5  --   CL 105  --   CO2 29  --   BUN 45*  --   CREATININE 2.60* 2.55*  CALCIUM 9.2  --   GLUCOSE 103*  --    Recent Labs     05/15/12  1138  TROPONINI  <0.30   Radiology/Studies: Dg Chest 2 View  04/28/2012  *RADIOLOGY REPORT*  Clinical Data: Chronic cough, hypertension, shortness of breath  CHEST - 2 VIEW  Comparison: 03/17/2012  Findings: Cardiomegaly noted with central vascular congestion. Chronic left midlung scarring.  No definite superimposed CHF or pneumonia.  No large effusion or pneumothorax.  Tracheostomy in the mid trachea.  Atherosclerotic changes of the aorta.  IMPRESSION: Stable cardiomegaly and vascular congestion.  Chronic left midlung scarring  No superimposed CHF or pneumonia   Original Report Authenticated By: Judie Petit. Miles Costain, M.D.    Dg Chest Port 1 View  05/15/2012  *RADIOLOGY REPORT*  Clinical Data: Short of breath.  Tracheostomy.  COPD.  Check tracheostomy position.  PORTABLE CHEST - 1 VIEW  Comparison: 04/28/2012.  Findings: Chronic elevation of the left hemidiaphragm with subsegmental atelectasis.  The tracheostomy  projects over the midline thoracic inlet.  The tip overlies the tracheal air column. There is no airspace disease or pleural effusion identified.  The cardiopericardial silhouette appears unchanged allowing for elevation of the left hemidiaphragm.  IMPRESSION: Tracheostomy appears in the midline over the thoracic inlet.  Tip terminates over the tracheal air column.  Chronic elevation of the left hemidiaphragm, slightly more prominent on today's exam with associated atelectasis.   Original Report Authenticated By: Andreas Newport, M.D.    EKG: NSR, 79 bpm, 1st degree AVB, RBBB, LAFB, RAD  ASSESSMENT AND PLAN:   72 y.o. 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   1. Acute on chronic respiratory failure 2. OHS/OSA 3. COPD 4. Combined systolic and diastolic CHF 5. Prior longstanding tobacco abuse 6. CKD, stage IV 7. Normocytic anemia 8. Hypertension 9. Type 2 DM  Discussed EKG  changes with MD. These may represent lead reversal. Similar findings have been seen but in the lateral leads on prior EKG tracings. Initial trop-I and lack of chest pain support low likelihood for ACS. Will plan to continue cycling enzymes. From a heart failure perspective, her weight on last discharge by the CHF team was 219 lbs after significant diuresis. It is now in the 170s. On exam, she appears fairly euvolemic. pBNP has steadily down-trended since fall of last year. CHF status is fairly stable. Suspect her increase productive cough and expectorated/aspirated mucus accounts for her dyspnea. She has severe,  multifactorial pulmonary disease. Given these findings and worsening renal function, would defer on ischemic evaluation. Most recent 2D echo shows improved LV function with medical management alone. Resume home medications. Will continue to follow.    Signed, R. Hurman Horn, PA-C 05/15/2012, 1:15 PM  Patient seen and examined and history reviewed. Agree with above findings and plan. 72 yo BF with extensive history of respiratory failure presents with symptoms of worseing SOB for 24-48 hours. She has had some cough with clear phlegm. She denies any chest pain or fever. Breathing improved with suctioning a mucus plug in ED. On exam she has a trach in place. No JVD. Lungs with coarse rhonchi. Cardiac exam reveals a 2/6 harsh systolic murmur along the LSB. CXR shows no edema. Elevation of left hemidiaphragm. BNP is lower than historical controls. Ecg was reviewed and comparisons were made with old Ecgs. I think her limb leads are reversed. Prior Ecgs show a RBBB/LAFB pattern with LVH and T wave inversion in leads 1 and Avl. Now her Ecg shows a rightward axis with increased voltage in the inferior leads. We will repeat Ecg. Cycle cardiac enzymes. No clear symptoms of ischemia. I think her current episode of respiratory failure is probably due to mucus plugging. There is no evidence of worsening CHF. She has no significant edema. Her weight is down. BNP level is down and CXR is clear. Will defer pulmonary treatment to the primary team.  Theron Arista [facility] 05/15/2012 2:20 PM

## 2012-05-15 NOTE — ED Provider Notes (Signed)
History     CSN: [id number]  Arrival date & time 05/15/12  0008   First MD Initiated Contact with Patient 05/15/12 0125      Chief Complaint  Patient presents with  . Shortness of Breath    (Consider location/radiation/quality/duration/timing/severity/associated sxs/prior treatment) HPI [name] is a 72 y.o. female past medical history significant for cardiopulmonary arrest about 3 months ago, was recently hospitalized for acute on chronic respiratory failure during power arches, presents today with one-day history of worsening shortness of breath. Says she changed her trach out this afternoon, and since then has been getting worse. Patient has COPD he is on 4 L by nasal cannula, is currently what she is on, she arrived via EMS, in no respiratory distress. Patient denies any chest pain, productive cough, fevers, chills, abdominal pain, nausea vomiting or diarrhea. She says her symptoms are severe, constant, not alleviated by home oxygen, she says she's been taking all of her medications.    Past Medical History  Diagnosis Date  . COPD (chronic obstructive pulmonary disease)     on home o2  . Chronic renal insufficiency   . CVA (cerebral infarction)     hx  . Hypertensive heart disease with congestive heart failure   . Chronic cor pulmonale   . Cardiomyopathy of undetermined type 09/07/2011  . Chronic kidney disease (CKD), stage IV (severe)   . Hyperlipdemia   . Obesity (BMI 30-39.9)   . Sleep apnea   . History of gout 09/07/2011  . Heart failure 7/13  . Shortness of breath   . Anemia   . Vascular disease   . Asthma   . Anginal pain   . Hypertension   . Type II or unspecified type diabetes mellitus without mention of complication, not stated as uncontrolled   . Stroke   . GERD (gastroesophageal reflux disease)   . Arthritis   . Bundle branch block   . Thrombocytopenia   . Acute and chronic respiratory failure     Past Surgical History  Procedure Laterality  Date  . Bilateral oophorectomy    . Cardiac catheterization      right heart cath  . Laparoscopic gastrostomy  12/21/2011    Procedure: LAPAROSCOPIC GASTROSTOMY;  Surgeon: Axel Filler, MD;  Location: [facility] OR;  Service: General;  Laterality: N/A;  laparoscopic gastrostomy possible open feeding tube  . Abdominal hysterectomy    . Tracheostomy  11/2011  . Cataracts    . Tracheostomy tube placement  02/18/2012    Procedure: TRACHEOSTOMY;  Surgeon: Serena Colonel, MD;  Location: [facility] OR;  Service: ENT;  Laterality: N/A;    Family History  Problem Relation Age of Onset  . Heart attack Father   . Stroke Mother     CVA  . Coronary artery disease Daughter     History  Substance Use Topics  . Smoking status: Former Smoker -- 1.00 packs/day for 55 years    Types: Cigarettes    Quit date: 08/28/2011  . Smokeless tobacco: Never Used  . Alcohol Use: No    OB History   Grav Para Term Preterm Abortions TAB SAB Ect Mult Living                  Review of Systems At least 10pt or greater review of systems completed and are negative except where specified in the HPI.  Allergies  Sulfonamide derivatives  Home Medications   Current Outpatient Rx  Name  Route  Sig  Dispense  Refill  . amLODipine (NORVASC) 10 MG tablet   Oral   Take 10 mg by mouth daily.         Marland Kitchen aspirin 81 MG chewable tablet   Oral   Chew 1 tablet (81 mg total) by mouth daily.         . busPIRone (BUSPAR) 5 MG tablet   Oral   Take 5 mg by mouth 2 (two) times daily.         . carvedilol (COREG) 3.125 MG tablet   Oral   Take 1 tablet (3.125 mg total) by mouth 2 (two) times daily with a meal.         . chlorhexidine (PERIDEX) 0.12 % solution   Mouth/Throat   Use as directed 15 mLs in the mouth or throat 2 (two) times daily.   120 mL      . cloNIDine (CATAPRES) 0.1 MG tablet   Oral   Take 0.1 mg by mouth 2 (two) times daily.         . ferrous sulfate 325 (65 FE) MG EC tablet   Oral   Take 325 mg  by mouth 2 (two) times daily.         . hydrALAZINE (APRESOLINE) 100 MG tablet   Oral   Take 100 mg by mouth 3 (three) times daily.         . metoCLOPramide (REGLAN) 5 MG tablet   Oral   Take 5 mg by mouth 4 (four) times daily.         Marland Kitchen oxycodone (OXY-IR) 5 MG capsule   Oral   Take 5 mg by mouth every 6 (six) hours as needed (for moderate pain).         . pantoprazole (PROTONIX) 20 MG tablet   Oral   Take 20 mg by mouth every evening.         . pravastatin (PRAVACHOL) 40 MG tablet   Oral   Take 40 mg by mouth daily.         . sennosides-docusate sodium (SENOKOT-S) 8.6-50 MG tablet   Oral   Take 1 tablet by mouth 2 (two) times daily as needed for constipation.         . sertraline (ZOLOFT) 100 MG tablet   Oral   Take 100 mg by mouth at bedtime.         . torsemide (DEMADEX) 20 MG tablet   Oral   Take 20 mg by mouth 2 (two) times daily.           BP 160/74  Pulse 82  Temp(Src) 97.5 F (36.4 C) (Oral)  Resp 32  SpO2 92%  Physical Exam  Nursing notes reviewed.  Electronic medical record reviewed. VITAL SIGNS:   Filed Vitals:   05/15/12 0054 05/15/12 0100 05/15/12 0131 05/15/12 0635  BP:  145/58  160/74  Pulse:  70  82  Temp:      TempSrc:      Resp:    32  SpO2: 100% 100% 90% 92%   CONSTITUTIONAL: Awake, oriented, appears non-toxic HENT: Atraumatic, normocephalic, oral mucosa pink and moist, airway patent. Nares patent without drainage. External ears normal. EYES: Conjunctiva clear, EOMI, PERRLA NECK: Trachea midline, non-tender, supple CARDIOVASCULAR: Normal heart rate, Normal rhythm, No murmurs, rubs, gallops PULMONARY/CHEST: Some faint end expiratory wheezes, dry rales in the bases. Decreased in left base ABDOMINAL: Non-distended, soft, non-tender - no rebound or guarding.  BS normal. NEUROLOGIC: Non-focal, moving all four extremities, no  gross sensory or motor deficits. 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  Narrative Interpretation: New T-wave inversions  Labs Reviewed  CBC WITH DIFFERENTIAL - Abnormal; Notable for the following:    Hemoglobin 10.7 (*)    HCT 34.4 (*)    MCH 24.9 (*)    RDW 16.8 (*)    All other components within normal limits  BASIC METABOLIC PANEL - Abnormal; Notable for the following:    Glucose, Bld 103 (*)    BUN 45 (*)    Creatinine, Ser 2.60 (*)    GFR calc non Af Amer 17 (*)    GFR calc Af Amer 20 (*)    All other components within normal limits  PRO B NATRIURETIC PEPTIDE  POCT I-STAT TROPONIN I   Dg Chest Port 1 View  05/15/2012  *RADIOLOGY REPORT*  Clinical Data: Short of breath.  Tracheostomy.  COPD.  Check tracheostomy position.  PORTABLE CHEST - 1 VIEW  Comparison: 04/28/2012.  Findings: Chronic elevation of the left hemidiaphragm with subsegmental atelectasis.  The tracheostomy projects over the midline thoracic inlet.  The tip overlies the tracheal air column. There is no airspace disease or pleural effusion identified.  The cardiopericardial silhouette appears unchanged allowing for elevation of the left hemidiaphragm.  IMPRESSION: Tracheostomy appears in the midline over the thoracic inlet.  Tip terminates over the tracheal air column.  Chronic elevation of the left hemidiaphragm, slightly more prominent on today's exam with associated atelectasis.   Original Report Authenticated By: Andreas Newport, M.D.      1. Chronic respiratory disease   2. Tracheostomy status   3. CKD (chronic kidney disease) stage 3, GFR 30-59 ml/min       MDM  [name] is a 72 y.o. female who presents with shortness of breath. Patient was  recently admitted for the same thing, that time she had a BNP of 3000, an elevated d-dimer at that time,. My suspicion is low for pulmonary embolism, patient is short of breath but she's not tachycardic, she is not tachypneic, she is hypoxic, not have a history of venous thromboembolic disease is no active malignancy, no trauma, the patient is fairly immobile, but even then she is low risk with a 1.5. I do not think a d-dimer will help, and will likely be elevated, I do not think she needs any imaging. We'll obtain a basic chest x-ray, cardiac markers, EKG and treat the patient with bronchodilators.  Patient feels markedly better with bronchodilators, respiratory therapy did get a big mucous chunk out of her trachea.  Labs otherwise unremarkable with respect to her baseline, she is chronic kidney disease, chronic anemia, no elevated white count troponin is negative. BNP was added on half of what it was on her last admission. Chest x-ray shows the tracheostomy midline and appears to be incorrect placement, she is chronic elevation of left hemidiaphragm with some atelectasis.  Of concern is patient's new T-wave inversions in the inferior leads. Could be anginal equivalent with her shortness of breath discussed with hospitalist for admission. Patient is not comfortable going home she says she's not breathing better.  Jones Skene, MD 05/16/12 1135

## 2012-05-15 NOTE — H&P (Signed)
[facility] History and Physical  [name] ZOX:[id number] DOB: 03/25/1940 DOA: 05/15/2012  Referring physician: Dr Rulon Abide PCP: Gwynneth Aliment, MD  Specialists: [facility]  Chief Complaint: shortness of breath and chest pressure over the weekend.  HPI: [name] is a 72 y.o. female witH h/o cardio pulmonary arrest recently, s/p trach,  s/p recent admission for acute on chronic respirator failure and discharged on 3/14 came in today for worsening sob since Saturday associated with some chest pressure substernally. She denies any sharp chest pain . On arrival to the ED, she was on 4 lit nasal canula oxygen, and she reports her sob improved with bronchodilators and suctioning of her trachea. Patient reports even though she is feeling little better, she doesn't feel comfortable going home. She was also found to have new T WAVE abnormalities on her EKG. We will admit her to medical service for evaluation of ACS, with cardiac enzymes and get cardiology consultation to assist [place].   Review of Systems: The patient denies anorexia, fever, weight loss,, vision loss, decreased hearing, hoarseness, chest pain, syncope,  peripheral edema, balance deficits, hemoptysis, abdominal pain, melena, hematochezia, severe indigestion/heartburn, hematuria, incontinence, genital sores, muscle weakness, suspicious skin lesions, transient blindness, depression, unusual weight change, abnormal bleeding, enlarged lymph nodes, angioedema, and breast masses.    Past Medical History  Diagnosis Date  . COPD (chronic obstructive pulmonary disease)     on home o2  . Chronic renal insufficiency   . CVA (cerebral infarction)     hx  . Hypertensive heart disease with congestive heart failure   . Chronic cor pulmonale   . Cardiomyopathy of undetermined type 09/07/2011  . Chronic kidney disease (CKD), stage IV (severe)   . Hyperlipdemia   . Obesity (BMI 30-39.9)   . Sleep apnea   . History of gout  09/07/2011  . Heart failure 7/13  . Shortness of breath   . Anemia   . Vascular disease   . Asthma   . Anginal pain   . Hypertension   . Type II or unspecified type diabetes mellitus without mention of complication, not stated as uncontrolled   . Stroke   . GERD (gastroesophageal reflux disease)   . Arthritis   . Bundle branch block   . Thrombocytopenia   . Acute and chronic respiratory failure    Past Surgical History  Procedure Laterality Date  . Bilateral oophorectomy    . Cardiac catheterization      right heart cath  . Laparoscopic gastrostomy  12/21/2011    Procedure: LAPAROSCOPIC GASTROSTOMY;  Surgeon: Axel Filler, MD;  Location: [facility] OR;  Service: General;  Laterality: N/A;  laparoscopic gastrostomy possible open feeding tube  . Abdominal hysterectomy    . Tracheostomy  11/2011  . Cataracts    . Tracheostomy tube placement  02/18/2012    Procedure: TRACHEOSTOMY;  Surgeon: Serena Colonel, MD;  Location: [facility] OR;  Service: ENT;  Laterality: N/A;   Social History:  reports that she quit smoking about 8 months ago. Her smoking use included Cigarettes. She has a 55 pack-year smoking history. She has never used smokeless tobacco. She reports that she does not drink alcohol or use illicit drugs.  where does patient live--home,  Allergies  Allergen Reactions  . Sulfonamide Derivatives Hives and Rash    Family History  Problem Relation Age of Onset  . Heart attack Father   . Stroke Mother     CVA  . Coronary artery disease Daughter  Prior to Admission medications   Medication Sig Start Date End Date Taking? Authorizing Provider  amLODipine (NORVASC) 10 MG tablet Take 10 mg by mouth daily.   Yes Historical Provider, MD  aspirin 81 MG chewable tablet Chew 1 tablet (81 mg total) by mouth daily. 02/19/12  Yes Simonne Martinet, NP  busPIRone (BUSPAR) 5 MG tablet Take 5 mg by mouth 2 (two) times daily.   Yes Historical Provider, MD  carvedilol (COREG) 3.125 MG tablet Take 1  tablet (3.125 mg total) by mouth 2 (two) times daily with a meal. 02/19/12  Yes Simonne Martinet, NP  chlorhexidine (PERIDEX) 0.12 % solution Use as directed 15 mLs in the mouth or throat 2 (two) times daily. 02/19/12  Yes Simonne Martinet, NP  cloNIDine (CATAPRES) 0.1 MG tablet Take 0.1 mg by mouth 2 (two) times daily.   Yes Historical Provider, MD  ferrous sulfate 325 (65 FE) MG EC tablet Take 325 mg by mouth 2 (two) times daily.   Yes Historical Provider, MD  hydrALAZINE (APRESOLINE) 100 MG tablet Take 100 mg by mouth 3 (three) times daily.   Yes Historical Provider, MD  metoCLOPramide (REGLAN) 5 MG tablet Take 5 mg by mouth 4 (four) times daily.   Yes Historical Provider, MD  oxycodone (OXY-IR) 5 MG capsule Take 5 mg by mouth every 6 (six) hours as needed (for moderate pain).   Yes Historical Provider, MD  pantoprazole (PROTONIX) 20 MG tablet Take 20 mg by mouth every evening.   Yes Historical Provider, MD  pravastatin (PRAVACHOL) 40 MG tablet Take 40 mg by mouth daily.   Yes Historical Provider, MD  sennosides-docusate sodium (SENOKOT-S) 8.6-50 MG tablet Take 1 tablet by mouth 2 (two) times daily as needed for constipation.   Yes Historical Provider, MD  sertraline (ZOLOFT) 100 MG tablet Take 100 mg by mouth at bedtime.   Yes Historical Provider, MD  torsemide (DEMADEX) 20 MG tablet Take 20 mg by mouth 2 (two) times daily.   Yes Historical Provider, MD   Physical Exam: Filed Vitals:   05/15/12 0930 05/15/12 1000 05/15/12 1130 05/15/12 1134  BP: 155/66 147/60    Pulse: 73 72  74  Temp:      TempSrc:      Resp: 18 24  27   SpO2: 98% 98% 95% 95%    Constitutional: Vital signs reviewed.  Patient is a well-developed and well-nourished  in no acute distress and cooperative with exam. Alert and oriented x3.  Head: Normocephalic and atraumatic Mouth: no erythema or exudates, MMM Eyes: PERRL, EOMI, conjunctivae normal, No scleral icterus.  Neck: s/p trach  No mass, thyromegaly, or carotid bruit  present.  Cardiovascular: RRR, S1 normal, S2 normal, .  Pulmonary/Chest: CTAB, scattered rhonchi. No wheezing Abdominal: Soft. Non-tender, non-distended, bowel sounds are normal, no masses, organomegaly, or guarding present.  Musculoskeletal: No joint deformities, erythema, or stiffness, ROM full and no nontender Hematology: no cervical, inginal, or axillary adenopathy.  Neurological: A&O x3, Strength is normal and symmetric bilaterally, cranial nerve II-XII are grossly intact, no focal motor deficit, sensory intact to light touch bilaterally.  Skin: Warm, dry and intact. No rash, cyanosis, or clubbing.  Psychiatric: Normal mood and affect. speech and behavior is normal.   Labs on Admission:  Basic Metabolic Panel:  Recent Labs Lab 05/15/12 0131 05/15/12 1138  NA 144  --   K 3.5  --   CL 105  --   CO2 29  --   GLUCOSE 103*  --  BUN 45*  --   CREATININE 2.60* 2.55*  CALCIUM 9.2  --    Liver Function Tests: No results found for this basename: AST, ALT, ALKPHOS, BILITOT, PROT, ALBUMIN,  in the last 168 hours No results found for this basename: LIPASE, AMYLASE,  in the last 168 hours No results found for this basename: AMMONIA,  in the last 168 hours CBC:  Recent Labs Lab 05/15/12 0131  WBC 6.8  NEUTROABS 4.5  HGB 10.7*  HCT 34.4*  MCV 80.0  PLT 169   Cardiac Enzymes:  Recent Labs Lab 05/15/12 1138  TROPONINI <0.30    BNP (last 3 results)  Recent Labs  02/14/12 2127 04/28/12 1611 05/15/12 0741  PROBNP 10254.0* 3133.0* 1451.0*   CBG:  Recent Labs Lab 05/15/12 1126  GLUCAP 126*    Radiological Exams on Admission: Dg Chest Port 1 View  05/15/2012  *RADIOLOGY REPORT*  Clinical Data: Short of breath.  Tracheostomy.  COPD.  Check tracheostomy position.  PORTABLE CHEST - 1 VIEW  Comparison: 04/28/2012.  Findings: Chronic elevation of the left hemidiaphragm with subsegmental atelectasis.  The tracheostomy projects over the midline thoracic inlet.  The tip  overlies the tracheal air column. There is no airspace disease or pleural effusion identified.  The cardiopericardial silhouette appears unchanged allowing for elevation of the left hemidiaphragm.  IMPRESSION: Tracheostomy appears in the midline over the thoracic inlet.  Tip terminates over the tracheal air column.  Chronic elevation of the left hemidiaphragm, slightly more prominent on today's exam with associated atelectasis.   Original Report Authenticated By: Andreas Newport, M.D.     EKG: t wave inversions, IN II, III avf, lead v2 and v3.   Assessment/Plan Active Problems: 1. Shortness of breath and chest pressure: being admitted to stepdown in view of her trach collar and need for intermittent suctioning.  - tele monitor. Abnormal EKG today,w ill repeat another EKG later today.  - rule out ACS, Serial cardiac enzymes, echo in December 2013 showed severe LVH, and EF OF 45 TO 50%.  - Cardiology consulted.  - resume aspirin, coreg and statin.   2. Chronic systolic and diastolic heart failure: appears to be compensated. Resume torsemide and hydralazine.   3. CKD stage 3 to4: at baseline.   4. Anemia of chronic disease. Stable.   5. Chronic respiratory failure secondary to COPD: cxr reveals some atelectasis. Nebs as needed, nasal oxygen.   6. DM:  CBG (last 3)   Recent Labs  05/15/12 1126  GLUCAP 126*    Resume SSI.   7. DVT prophylaxis.      Code Status:full code Family Communication: none at bedside Disposition Plan: possibly in 1 to 2 days    [facility] Pager [phone]  If 7PM-7AM, please contact night-coverage [email or web address] Password [email or web address] 05/15/2012, 1:50 PM

## 2012-05-15 NOTE — H&P (Signed)
[facility] History and Physical  MAELA TAKEDA ZOX:[id number] DOB: 1940/08/25 DOA: 05/15/2012  Referring physician: Dr Rulon Abide PCP: Gwynneth Aliment, MD  Specialists: [place]  Chief Complaint: shortness of breath and chest pressure over the weekend.  HPI: [name] is a 72 y.o. female witH h/o cardio pulmonary arrest recently, s/p trach,  s/p recent admission for acute on chronic respirator failure and discharged on 3/14 came in today for worsening sob since Saturday associated with some chest pressure substernally. She denies any sharp chest pain . On arrival to the ED, she was on 4 lit nasal canula oxygen, and she reports her sob improved with bronchodilators and suctioning of her trachea. Patient reports even though she is feeling little better, she doesn't feel comfortable going home. She was also found to have new T WAVE abnormalities on her EKG. We will admit her to medical service for evaluation of ACS, with cardiac enzymes and get cardiology consultation to assist [place].   Review of Systems: The patient denies anorexia, fever, weight loss,, vision loss, decreased hearing, hoarseness, chest pain, syncope,  peripheral edema, balance deficits, hemoptysis, abdominal pain, melena, hematochezia, severe indigestion/heartburn, hematuria, incontinence, genital sores, muscle weakness, suspicious skin lesions, transient blindness, depression, unusual weight change, abnormal bleeding, enlarged lymph nodes, angioedema, and breast masses.    Past Medical History  Diagnosis Date  . COPD (chronic obstructive pulmonary disease)     on home o2  . Chronic renal insufficiency   . CVA (cerebral infarction)     hx  . Hypertensive heart disease with congestive heart failure   . Chronic cor pulmonale   . Cardiomyopathy of undetermined type 09/07/2011  . Chronic kidney disease (CKD), stage IV (severe)   . Hyperlipdemia   . Obesity (BMI 30-39.9)   . Sleep apnea   . History of gout  09/07/2011  . Heart failure 7/13  . Shortness of breath   . Anemia   . Vascular disease   . Asthma   . Anginal pain   . Hypertension   . Type II or unspecified type diabetes mellitus without mention of complication, not stated as uncontrolled   . Stroke   . GERD (gastroesophageal reflux disease)   . Arthritis   . Bundle branch block   . Thrombocytopenia   . Acute and chronic respiratory failure    Past Surgical History  Procedure Laterality Date  . Bilateral oophorectomy    . Cardiac catheterization      right heart cath  . Laparoscopic gastrostomy  12/21/2011    Procedure: LAPAROSCOPIC GASTROSTOMY;  Surgeon: Axel Filler, MD;  Location: [facility] OR;  Service: General;  Laterality: N/A;  laparoscopic gastrostomy possible open feeding tube  . Abdominal hysterectomy    . Tracheostomy  11/2011  . Cataracts    . Tracheostomy tube placement  02/18/2012    Procedure: TRACHEOSTOMY;  Surgeon: Serena Colonel, MD;  Location: [facility] OR;  Service: ENT;  Laterality: N/A;   Social History:  reports that she quit smoking about 8 months ago. Her smoking use included Cigarettes. She has a 55 pack-year smoking history. She has never used smokeless tobacco. She reports that she does not drink alcohol or use illicit drugs.  where does patient live--home,  Allergies  Allergen Reactions  . Sulfonamide Derivatives Hives and Rash    Family History  Problem Relation Age of Onset  . Heart attack Father   . Stroke Mother     CVA  . Coronary artery disease Daughter  Prior to Admission medications   Medication Sig Start Date End Date Taking? Authorizing Provider  amLODipine (NORVASC) 10 MG tablet Take 10 mg by mouth daily.   Yes Historical Provider, MD  aspirin 81 MG chewable tablet Chew 1 tablet (81 mg total) by mouth daily. 02/19/12  Yes Simonne Martinet, NP  busPIRone (BUSPAR) 5 MG tablet Take 5 mg by mouth 2 (two) times daily.   Yes Historical Provider, MD  carvedilol (COREG) 3.125 MG tablet Take 1  tablet (3.125 mg total) by mouth 2 (two) times daily with a meal. 02/19/12  Yes Simonne Martinet, NP  chlorhexidine (PERIDEX) 0.12 % solution Use as directed 15 mLs in the mouth or throat 2 (two) times daily. 02/19/12  Yes Simonne Martinet, NP  cloNIDine (CATAPRES) 0.1 MG tablet Take 0.1 mg by mouth 2 (two) times daily.   Yes Historical Provider, MD  ferrous sulfate 325 (65 FE) MG EC tablet Take 325 mg by mouth 2 (two) times daily.   Yes Historical Provider, MD  hydrALAZINE (APRESOLINE) 100 MG tablet Take 100 mg by mouth 3 (three) times daily.   Yes Historical Provider, MD  metoCLOPramide (REGLAN) 5 MG tablet Take 5 mg by mouth 4 (four) times daily.   Yes Historical Provider, MD  oxycodone (OXY-IR) 5 MG capsule Take 5 mg by mouth every 6 (six) hours as needed (for moderate pain).   Yes Historical Provider, MD  pantoprazole (PROTONIX) 20 MG tablet Take 20 mg by mouth every evening.   Yes Historical Provider, MD  pravastatin (PRAVACHOL) 40 MG tablet Take 40 mg by mouth daily.   Yes Historical Provider, MD  sennosides-docusate sodium (SENOKOT-S) 8.6-50 MG tablet Take 1 tablet by mouth 2 (two) times daily as needed for constipation.   Yes Historical Provider, MD  sertraline (ZOLOFT) 100 MG tablet Take 100 mg by mouth at bedtime.   Yes Historical Provider, MD  torsemide (DEMADEX) 20 MG tablet Take 20 mg by mouth 2 (two) times daily.   Yes Historical Provider, MD   Physical Exam: Filed Vitals:   05/15/12 0930 05/15/12 1000 05/15/12 1130 05/15/12 1134  BP: 155/66 147/60    Pulse: 73 72  74  Temp:      TempSrc:      Resp: 18 24  27   SpO2: 98% 98% 95% 95%    Constitutional: Vital signs reviewed.  Patient is a well-developed and well-nourished  in no acute distress and cooperative with exam. Alert and oriented x3.  Head: Normocephalic and atraumatic Mouth: no erythema or exudates, MMM Eyes: PERRL, EOMI, conjunctivae normal, No scleral icterus.  Neck: s/p trach  No mass, thyromegaly, or carotid bruit  present.  Cardiovascular: RRR, S1 normal, S2 normal, .  Pulmonary/Chest: CTAB, scattered rhonchi. No wheezing Abdominal: Soft. Non-tender, non-distended, bowel sounds are normal, no masses, organomegaly, or guarding present.  Musculoskeletal: No joint deformities, erythema, or stiffness, ROM full and no nontender Hematology: no cervical, inginal, or axillary adenopathy.  Neurological: A&O x3, Strength is normal and symmetric bilaterally, cranial nerve II-XII are grossly intact, no focal motor deficit, sensory intact to light touch bilaterally.  Skin: Warm, dry and intact. No rash, cyanosis, or clubbing.  Psychiatric: Normal mood and affect. speech and behavior is normal.   Labs on Admission:  Basic Metabolic Panel:  Recent Labs Lab 05/15/12 0131 05/15/12 1138  NA 144  --   K 3.5  --   CL 105  --   CO2 29  --   GLUCOSE 103*  --  BUN 45*  --   CREATININE 2.60* 2.55*  CALCIUM 9.2  --    Liver Function Tests: No results found for this basename: AST, ALT, ALKPHOS, BILITOT, PROT, ALBUMIN,  in the last 168 hours No results found for this basename: LIPASE, AMYLASE,  in the last 168 hours No results found for this basename: AMMONIA,  in the last 168 hours CBC:  Recent Labs Lab 05/15/12 0131  WBC 6.8  NEUTROABS 4.5  HGB 10.7*  HCT 34.4*  MCV 80.0  PLT 169   Cardiac Enzymes:  Recent Labs Lab 05/15/12 1138  TROPONINI <0.30    BNP (last 3 results)  Recent Labs  02/14/12 2127 04/28/12 1611 05/15/12 0741  PROBNP 10254.0* 3133.0* 1451.0*   CBG:  Recent Labs Lab 05/15/12 1126  GLUCAP 126*    Radiological Exams on Admission: Dg Chest Port 1 View  05/15/2012  *RADIOLOGY REPORT*  Clinical Data: Short of breath.  Tracheostomy.  COPD.  Check tracheostomy position.  PORTABLE CHEST - 1 VIEW  Comparison: 04/28/2012.  Findings: Chronic elevation of the left hemidiaphragm with subsegmental atelectasis.  The tracheostomy projects over the midline thoracic inlet.  The tip  overlies the tracheal air column. There is no airspace disease or pleural effusion identified.  The cardiopericardial silhouette appears unchanged allowing for elevation of the left hemidiaphragm.  IMPRESSION: Tracheostomy appears in the midline over the thoracic inlet.  Tip terminates over the tracheal air column.  Chronic elevation of the left hemidiaphragm, slightly more prominent on today's exam with associated atelectasis.   Original Report Authenticated By: Andreas Newport, M.D.     EKG: t wave inversions, IN II, III avf, lead v2 and v3.   Assessment/Plan Active Problems: 1. Shortness of breath and chest pressure: being admitted to stepdown in view of her trach collar and need for intermittent suctioning.  - tele monitor. Abnormal EKG today,w ill repeat another EKG later today.  - rule out ACS, Serial cardiac enzymes, echo in December 2013 showed severe LVH, and EF OF 45 TO 50%.  - Cardiology consulted.  - resume aspirin, coreg and statin.   2. Chronic systolic and diastolic heart failure: appears to be compensated. Resume torsemide and hydralazine.   3. CKD stage 3 to4: at baseline.   4. Anemia of chronic disease. Stable.   5. Chronic respiratory failure secondary to COPD: cxr reveals some atelectasis. Nebs as needed, nasal oxygen.   6. DVT prophylaxis.      Code Status:full code Family Communication: none at bedside Disposition Plan: possibly in 1 to 2 days    [facility] Pager [phone]  If 7PM-7AM, please contact night-coverage [email or web address] Password [email or web address] 05/15/2012, 1:24 PM

## 2012-05-15 NOTE — ED Notes (Signed)
Patient said she changed out her trach this afternoon, and she has been getting progressively SOB.  The patient called EMS because she was getting worse.  Upon arrival, EMS said she did not appear to be in distress.  The patient is a chronic trach and is on 4Liters nasal cannula.

## 2012-05-16 DIAGNOSIS — I5023 Acute on chronic systolic (congestive) heart failure: Secondary | ICD-10-CM

## 2012-05-16 DIAGNOSIS — J9600 Acute respiratory failure, unspecified whether with hypoxia or hypercapnia: Secondary | ICD-10-CM

## 2012-05-16 DIAGNOSIS — I5021 Acute systolic (congestive) heart failure: Secondary | ICD-10-CM

## 2012-05-16 DIAGNOSIS — I428 Other cardiomyopathies: Secondary | ICD-10-CM

## 2012-05-16 LAB — GLUCOSE, CAPILLARY
Glucose-Capillary: 126 mg/dL — ABNORMAL HIGH (ref 70–99)
Glucose-Capillary: 96 mg/dL (ref 70–99)

## 2012-05-16 LAB — CBC
HCT: 32 % — ABNORMAL LOW (ref 36.0–46.0)
MCH: 24.8 pg — ABNORMAL LOW (ref 26.0–34.0)
MCHC: 30.9 g/dL (ref 30.0–36.0)
MCV: 80 fL (ref 78.0–100.0)
Platelets: 178 10*3/uL (ref 150–400)
RDW: 16.8 % — ABNORMAL HIGH (ref 11.5–15.5)
WBC: 7.5 10*3/uL (ref 4.0–10.5)

## 2012-05-16 LAB — BASIC METABOLIC PANEL
BUN: 46 mg/dL — ABNORMAL HIGH (ref 6–23)
CO2: 28 mEq/L (ref 19–32)
Calcium: 9.3 mg/dL (ref 8.4–10.5)
Creatinine, Ser: 2.78 mg/dL — ABNORMAL HIGH (ref 0.50–1.10)

## 2012-05-16 MED ORDER — ISOSORBIDE MONONITRATE ER 30 MG PO TB24
30.0000 mg | ORAL_TABLET | Freq: Every day | ORAL | Status: DC
  Administered 2012-05-16 – 2012-05-19 (×4): 30 mg via ORAL
  Filled 2012-05-16 (×4): qty 1

## 2012-05-16 MED ORDER — TORSEMIDE 20 MG PO TABS
40.0000 mg | ORAL_TABLET | Freq: Two times a day (BID) | ORAL | Status: DC
  Administered 2012-05-16 – 2012-05-19 (×7): 40 mg via ORAL
  Filled 2012-05-16 (×8): qty 2

## 2012-05-16 MED ORDER — ALBUTEROL SULFATE (5 MG/ML) 0.5% IN NEBU
2.5000 mg | INHALATION_SOLUTION | RESPIRATORY_TRACT | Status: DC
  Administered 2012-05-16 – 2012-05-17 (×5): 2.5 mg via RESPIRATORY_TRACT
  Filled 2012-05-16 (×4): qty 0.5

## 2012-05-16 MED ORDER — IPRATROPIUM BROMIDE 0.02 % IN SOLN
0.5000 mg | RESPIRATORY_TRACT | Status: DC
  Administered 2012-05-16 – 2012-05-17 (×4): 0.5 mg via RESPIRATORY_TRACT
  Filled 2012-05-16 (×4): qty 2.5

## 2012-05-16 MED ORDER — CARVEDILOL 6.25 MG PO TABS
6.2500 mg | ORAL_TABLET | Freq: Two times a day (BID) | ORAL | Status: DC
  Administered 2012-05-16 – 2012-05-19 (×7): 6.25 mg via ORAL
  Filled 2012-05-16 (×8): qty 1

## 2012-05-16 NOTE — Progress Notes (Signed)
During the night, patient has had multiple periods of severe SOB.  Upon suctioning I have not been able to get anything.  RT set up the trach collar for her during the night instead of the nasal cannula to help loosen the secretions but she did not want to wear.  This am, she started desatting and dropped to the 70's on 5 L nasal cannula.  She has been switched back to the trach collar and her oxygen had to be increased to 98%.  She was still desatting into the 80's.  RT did trach care and changed the inner cannula.  She was finally able to cough up some thick secretions and her O2 level is currently 97%.  MD has been paged to update; waiting for response.  Will continue to monitor.   Vivi Martens RN

## 2012-05-16 NOTE — Progress Notes (Signed)
Patient ID: [name], female   DOB: 08-08-1940, 72 y.o.   MRN: [id number]    SUBJECTIVE: Breathing better this morning but not at baseline.    Marland Kitchen ipratropium  0.5 mg Nebulization Q4H   And  . albuterol  5 mg Nebulization Q4H  . amLODipine  10 mg Oral Daily  . aspirin  81 mg Oral Daily  . busPIRone  5 mg Oral BID  . carvedilol  6.25 mg Oral BID WC  . cloNIDine  0.1 mg Oral BID  . enoxaparin (LOVENOX) injection  30 mg Subcutaneous Q24H  . ferrous sulfate  325 mg Oral BID  . hydrALAZINE  100 mg Oral TID  . isosorbide mononitrate  30 mg Oral Daily  . metoCLOPramide  5 mg Oral TID AC & HS  . pantoprazole  20 mg Oral QHS  . sertraline  100 mg Oral QHS  . simvastatin  20 mg Oral q1800  . sodium chloride  3 mL Intravenous Q12H  . torsemide  40 mg Oral BID      Filed Vitals:   05/16/12 0800 05/16/12 0952 05/16/12 1018 05/16/12 1020  BP: 157/63 127/49 150/71 150/71  Pulse: 80 69    Temp: 97.8 F (36.6 C)     TempSrc: Oral     Resp: 20 21    Weight:      SpO2: 100% 92%      Intake/Output Summary (Last 24 hours) at 05/16/12 1059 Last data filed at 05/16/12 1021  Gross per 24 hour  Intake    543 ml  Output   1300 ml  Net   -757 ml    LABS: Basic Metabolic Panel:  Recent Labs  40/98/11 0131 05/15/12 1138 05/16/12 0505  NA 144  --  141  K 3.5  --  3.5  CL 105  --  103  CO2 29  --  28  GLUCOSE 103*  --  123*  BUN 45*  --  46*  CREATININE 2.60* 2.55* 2.78*  CALCIUM 9.2  --  9.3   Liver Function Tests: No results found for this basename: AST, ALT, ALKPHOS, BILITOT, PROT, ALBUMIN,  in the last 72 hours No results found for this basename: LIPASE, AMYLASE,  in the last 72 hours CBC:  Recent Labs  05/15/12 0131 05/16/12 0505  WBC 6.8 7.5  NEUTROABS 4.5  --   HGB 10.7* 9.9*  HCT 34.4* 32.0*  MCV 80.0 80.0  PLT 169 178   Cardiac Enzymes:  Recent Labs  05/15/12 1138 05/15/12 1656 05/15/12 2304  TROPONINI <0.30 <0.30 <0.30   BNP: No components  found with this basename: POCBNP,  D-Dimer: No results found for this basename: DDIMER,  in the last 72 hours Hemoglobin A1C: No results found for this basename: HGBA1C,  in the last 72 hours Fasting Lipid Panel: No results found for this basename: CHOL, HDL, LDLCALC, TRIG, CHOLHDL, LDLDIRECT,  in the last 72 hours Thyroid Function Tests: No results found for this basename: TSH, T4TOTAL, FREET3, T3FREE, THYROIDAB,  in the last 72 hours Anemia Panel: No results found for this basename: VITAMINB12, FOLATE, FERRITIN, TIBC, IRON, RETICCTPCT,  in the last 72 hours  RADIOLOGY: Dg Chest 2 View  04/28/2012  *RADIOLOGY REPORT*  Clinical Data: Chronic cough, hypertension, shortness of breath  CHEST - 2 VIEW  Comparison: 03/17/2012  Findings: Cardiomegaly noted with central vascular congestion. Chronic left midlung scarring.  No definite superimposed CHF or pneumonia.  No large effusion or pneumothorax.  Tracheostomy  in the mid trachea.  Atherosclerotic changes of the aorta.  IMPRESSION: Stable cardiomegaly and vascular congestion.  Chronic left midlung scarring  No superimposed CHF or pneumonia   Original Report Authenticated By: Judie Petit. Miles Costain, M.D.    Dg Chest Port 1 View  05/15/2012  *RADIOLOGY REPORT*  Clinical Data: Short of breath.  Tracheostomy.  COPD.  Check tracheostomy position.  PORTABLE CHEST - 1 VIEW  Comparison: 04/28/2012.  Findings: Chronic elevation of the left hemidiaphragm with subsegmental atelectasis.  The tracheostomy projects over the midline thoracic inlet.  The tip overlies the tracheal air column. There is no airspace disease or pleural effusion identified.  The cardiopericardial silhouette appears unchanged allowing for elevation of the left hemidiaphragm.  IMPRESSION: Tracheostomy appears in the midline over the thoracic inlet.  Tip terminates over the tracheal air column.  Chronic elevation of the left hemidiaphragm, slightly more prominent on today's exam with associated atelectasis.    Original Report Authenticated By: Andreas Newport, M.D.     PHYSICAL EXAM General: NAD Neck: JVP 10 cm, no thyromegaly or thyroid nodule.  Lungs: Rhonchi bilaterally, decreased breath sounds left base. CV: Nondisplaced PMI.  Heart regular S1/S2, no S3/S4, no murmur.  No peripheral edema.  No carotid bruit.  Normal pedal pulses.  Abdomen: Soft, nontender, no hepatosplenomegaly, no distention.  Neurologic: Alert.  Psych: Depressed affect. Extremities: No clubbing or cyanosis.   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  1. Dyspnea: This seems improved after suctioning of mucus plug.  ? Underlying bronchitis. Per primary team/pulmonary.   2. CHF: Primarily diastolic CHF, possibly mild acute on chronic diastolic CHF given elevated neck veins.  I do not think this is the primary cause of her presentation.  - Increase torsemide to 40 mg po bid while in the hospital.  - Will need to follow creatinine closely.  3. OHS/OSA: Has trach, oxygen dependent.  4. CKD: Basically stable.  Follow closely since I increased her torsemide.  [name] 05/16/2012 11:02 AM

## 2012-05-16 NOTE — Progress Notes (Signed)
Pharmacist Heart Failure Core Measure Documentation  Assessment: [name] has an EF documented as 45-50% on 01/2012 by ECHO.   She has diastolic dysfunction per MD notes.    Rationale: Heart failure patients with left ventricular systolic dysfunction (LVSD) and an EF < 40% should be prescribed an angiotensin converting enzyme inhibitor (ACEI) or angiotensin receptor blocker (ARB) at discharge unless a contraindication is documented in the medical record.  This patient is not currently on an ACEI or ARB for HF.  This note is being placed in the record in order to provide documentation that a contraindication to the use of these agents is present for this encounter.  ACE Inhibitor or Angiotensin Receptor Blocker is contraindicated (specify all that apply)  []   ACEI allergy AND ARB allergy []   Angioedema []   Moderate or severe aortic stenosis []   Hyperkalemia []   Hypotension []   Renal artery stenosis [x]   Worsening renal function, preexisting renal disease or dysfunction   Marcelino Scot 05/16/2012 2:42 PM

## 2012-05-16 NOTE — Progress Notes (Signed)
Set up patient with Trach collar 50% and she does not want to wear.  She states that she wants to wear her nasal cannula.

## 2012-05-16 NOTE — Progress Notes (Signed)
[facility] Progress Note [place] TEAM 1 - Stepdown/ICU TEAM   [name] WJX:[id number] DOB: Jul 19, 1940 DOA: 05/15/2012 PCP: Gwynneth Aliment, MD  Brief narrative: This is a 72 year old female status post tracheostomy, COPD, cor pulmonale, CKD stage IV, cardiomyopathy, diabetes mellitus who presented to the ER with a complaint of shortness of breath associated with substernal chest pressure. The patient has a tracheostomy but does not wear trach collar and chooses to wear nasal cannula. After suctioning of her trachea and administration of bronchodilators her dyspnea improved but she stated that she was too uncomfortable to go home. It was noted that she had new T wave abnormalities on her EKG and therefore she was admitted and a cardiology consult requested.  Assessment/Plan: Principal Problem:   Acute-on-chronic respiratory failure -Patient was admitted with a similar episode earlier this month and stabilized quickly -Suspect her dyspnea is related to COPD/obstructive sleep apnea and tracheostomy complications with mucous plugging -Dr. Shirlee Latch (cardiology) has also started IV Lasix with the suspicion that she is fluid overloaded  Active Problems:    Sleep apnea Possibly the reason for her chronic trachea    Hypertensive heart disease with congestive heart failure -Last EF measured to be 45- 50% with severe LVH, diffuse hypokinesis of the LV and grade 1 diastolic dysfunction -As mentioned above she is currently being diuresed per cardiology -Continue antihypertensives-BP stable    C O P D Currently stable  CKD stage IV Stable  Bedbound status   Code Status: Full code  Family Communication: None Disposition Plan: Continue to follow in step down unit today-possibly home tomorrow  Consultants: Cardiology  Procedures: None  Antibiotics: None  DVT prophylaxis: Lovenox  HPI/Subjective: Patient alert sitting up in bed. Per RN small amounts of clear mucus  are being suctioned up from trach. She does not have any significant cough or shortness of breath currently. No complaints of chest pain.   Objective: Blood pressure 132/64, pulse 71, temperature 98.6 F (37 C), temperature source Oral, resp. rate 19, weight 80.9 kg (178 lb 5.6 oz), SpO2 95.00%.  Intake/Output Summary (Last 24 hours) at 05/16/12 1743 Last data filed at 05/16/12 1714  Gross per 24 hour  Intake    663 ml  Output   1425 ml  Net   -762 ml     Exam: General: No acute respiratory distress Lungs: Clear to auscultation bilaterally without wheezes or crackles Cardiovascular: Regular rate and rhythm without murmur gallop or rub normal S1 and S2 Abdomen: Nontender, nondistended, soft, bowel sounds positive, no rebound, no ascites, no appreciable mass Extremities: No significant cyanosis, clubbing, or edema bilateral lower extremities  Data Reviewed: Basic Metabolic Panel:  Recent Labs Lab 05/15/12 0131 05/15/12 1138 05/16/12 0505  NA 144  --  141  K 3.5  --  3.5  CL 105  --  103  CO2 29  --  28  GLUCOSE 103*  --  123*  BUN 45*  --  46*  CREATININE 2.60* 2.55* 2.78*  CALCIUM 9.2  --  9.3   Liver Function Tests: No results found for this basename: AST, ALT, ALKPHOS, BILITOT, PROT, ALBUMIN,  in the last 168 hours No results found for this basename: LIPASE, AMYLASE,  in the last 168 hours No results found for this basename: AMMONIA,  in the last 168 hours CBC:  Recent Labs Lab 05/15/12 0131 05/16/12 0505  WBC 6.8 7.5  NEUTROABS 4.5  --   HGB 10.7* 9.9*  HCT 34.4* 32.0*  MCV  80.0 80.0  PLT 169 178   Cardiac Enzymes:  Recent Labs Lab 05/15/12 1138 05/15/12 1656 05/15/12 2304  TROPONINI <0.30 <0.30 <0.30   BNP (last 3 results)  Recent Labs  02/14/12 2127 04/28/12 1611 05/15/12 0741  PROBNP 10254.0* 3133.0* 1451.0*   CBG:  Recent Labs Lab 05/15/12 1638 05/15/12 2142 05/16/12 0728 05/16/12 1206 05/16/12 1653  GLUCAP 92 95 126* 96 98     Recent Results (from the past 240 hour(s))  MRSA PCR SCREENING     Status: None   Collection Time    05/15/12 11:10 AM      Result Value Range Status   MRSA by PCR NEGATIVE  NEGATIVE Final   Comment:            The GeneXpert MRSA Assay (FDA     approved for NASAL specimens     only), is one component of a     comprehensive MRSA colonization     surveillance program. It is not     intended to diagnose MRSA     infection nor to guide or     monitor treatment for     MRSA infections.     Studies:  Recent x-ray studies have been reviewed in detail by the Attending Physician  Scheduled Meds:  Scheduled Meds: . albuterol  2.5 mg Nebulization Q4H   And  . ipratropium  0.5 mg Nebulization Q4H  . amLODipine  10 mg Oral Daily  . aspirin  81 mg Oral Daily  . busPIRone  5 mg Oral BID  . carvedilol  6.25 mg Oral BID WC  . cloNIDine  0.1 mg Oral BID  . enoxaparin (LOVENOX) injection  30 mg Subcutaneous Q24H  . ferrous sulfate  325 mg Oral BID  . hydrALAZINE  100 mg Oral TID  . isosorbide mononitrate  30 mg Oral Daily  . metoCLOPramide  5 mg Oral TID AC & HS  . pantoprazole  20 mg Oral QHS  . sertraline  100 mg Oral QHS  . simvastatin  20 mg Oral q1800  . sodium chloride  3 mL Intravenous Q12H  . torsemide  40 mg Oral BID   Continuous Infusions:   Time spent on care of this patient: 35 minutes   [facility]  [facility] Office  [phone] Pager - Text Page per Loretha Stapler as per below:  On-Call/Text Page:      [email or web address]      password [email or web address]  If 7PM-7AM, please contact night-coverage [email or web address] Password [facility] 05/16/2012, 5:43 PM   LOS: 1 day

## 2012-05-17 LAB — GLUCOSE, CAPILLARY: Glucose-Capillary: 94 mg/dL (ref 70–99)

## 2012-05-17 LAB — BASIC METABOLIC PANEL
BUN: 43 mg/dL — ABNORMAL HIGH (ref 6–23)
Calcium: 9.2 mg/dL (ref 8.4–10.5)
Creatinine, Ser: 2.62 mg/dL — ABNORMAL HIGH (ref 0.50–1.10)
GFR calc Af Amer: 20 mL/min — ABNORMAL LOW (ref >90)
GFR calc non Af Amer: 17 mL/min — ABNORMAL LOW (ref >90)
Glucose, Bld: 94 mg/dL (ref 70–99)

## 2012-05-17 MED ORDER — IPRATROPIUM BROMIDE 0.02 % IN SOLN
0.5000 mg | Freq: Four times a day (QID) | RESPIRATORY_TRACT | Status: DC
  Administered 2012-05-17 – 2012-05-19 (×11): 0.5 mg via RESPIRATORY_TRACT
  Filled 2012-05-17 (×12): qty 2.5

## 2012-05-17 MED ORDER — ALBUTEROL SULFATE (5 MG/ML) 0.5% IN NEBU
2.5000 mg | INHALATION_SOLUTION | Freq: Four times a day (QID) | RESPIRATORY_TRACT | Status: DC
  Administered 2012-05-17 – 2012-05-19 (×11): 2.5 mg via RESPIRATORY_TRACT
  Filled 2012-05-17 (×10): qty 0.5

## 2012-05-17 NOTE — Progress Notes (Signed)
Report called to [facility] RN on 4700 at this time.  RT in room to see pt and states, "pt ok.  Doesn't need suctioning."  No s/s of any acute distress noted.  Harriett Sine, admission RN attempted to get pt's history prior to transfer and pt refuses.

## 2012-05-17 NOTE — Progress Notes (Signed)
[facility] Progress Note [place] TEAM 1 - Stepdown/ICU TEAM   SOLVEIG FANGMAN ZOX:[id number] DOB: [birth year] DOA: 05/15/2012 PCP: Gwynneth Aliment, MD  Brief narrative: 72 year old female status post tracheostomy, COPD, cor pulmonale, CKD stage IV, cardiomyopathy, diabetes mellitus who presented to the ER with a complaint of shortness of breath associated with substernal chest pressure. The patient has a tracheostomy but does not wear trach collar and chooses to wear nasal cannula. After suctioning of her trachea and administration of bronchodilators her dyspnea improved but she stated that she was too uncomfortable to go home. It was noted that she had new T wave abnormalities on her EKG and therefore she was admitted and a Cardiology consult requested.  Assessment/Plan:  Acute-on-chronic respiratory failure -Patient was admitted with a similar episode earlier this month and stabilized quickly -Suspect her dyspnea is related to COPD/obstructive sleep apnea and tracheostomy complications with mucous plugging -Dr. Shirlee Latch (cardiology) has also adjusted her diuretic with the suspicion that she is fluid overloaded  Sleep apnea -the reason for her chronic trachea  Acute exacerbation of chronic diastolic CHF - hypertensive heart disease -Last EF measured to be 45- 50% with severe LVH, diffuse hypokinesis of the LV and grade 1 diastolic dysfunction -As mentioned above she is currently being diuresed per cardiology -Continue antihypertensives - BP stable  COPD Currently stable  CKD stage IV Stable - baseline crt ~2.3  Bedbound status   Code Status: Full code  Family Communication: No family present at the time of my exam Disposition Plan: Transfer to telemetry bed  Consultants: Cardiology  Procedures: None  Antibiotics: None  DVT prophylaxis: Lovenox  HPI/Subjective: Patient sitting up in bedside chair.  Respirations appear comfortable.  Patient reports she  "doesn't feel good" but can't provide further detail.  She denies chest pain fevers chills nausea vomiting or abdominal pain.  Objective: Blood pressure 131/57, pulse 67, temperature 99.1 F (37.3 C), temperature source Oral, resp. rate 23, weight 81 kg (178 lb 9.2 oz), SpO2 99.00%.  Intake/Output Summary (Last 24 hours) at 05/17/12 1104 Last data filed at 05/17/12 0400  Gross per 24 hour  Intake    483 ml  Output   1275 ml  Net   -792 ml   Exam: General: No acute respiratory distress evident to exam Lungs: Distant breath sounds in all fields with no focal crackles and no appreciable wheeze Cardiovascular: Regular rate and rhythm without murmur gallop or rub normal S1 and S2 Abdomen: Nontender, nondistended, soft, bowel sounds positive, no rebound, no ascites, no appreciable mass Extremities: No significant cyanosis, clubbing, or edema bilateral lower extremities  Data Reviewed: Basic Metabolic Panel:  Recent Labs Lab 05/15/12 0131 05/15/12 1138 05/16/12 0505 05/17/12 0455  NA 144  --  141 137  K 3.5  --  3.5 3.9  CL 105  --  103 101  CO2 29  --  28 28  GLUCOSE 103*  --  123* 94  BUN 45*  --  46* 43*  CREATININE 2.60* 2.55* 2.78* 2.62*  CALCIUM 9.2  --  9.3 9.2   CBC:  Recent Labs Lab 05/15/12 0131 05/16/12 0505  WBC 6.8 7.5  NEUTROABS 4.5  --   HGB 10.7* 9.9*  HCT 34.4* 32.0*  MCV 80.0 80.0  PLT 169 178   Cardiac Enzymes:  Recent Labs Lab 05/15/12 1138 05/15/12 1656 05/15/12 2304  TROPONINI <0.30 <0.30 <0.30   BNP (last 3 results)  Recent Labs  02/14/12 2127 04/28/12 1611 05/15/12 0741  PROBNP 10254.0* 3133.0* 1451.0*   CBG:  Recent Labs Lab 05/16/12 0728 05/16/12 1206 05/16/12 1653 05/16/12 2131 05/17/12 0824  GLUCAP 126* 96 98 81 94    Recent Results (from the past 240 hour(s))  MRSA PCR SCREENING     Status: None   Collection Time    05/15/12 11:10 AM      Result Value Range Status   MRSA by PCR NEGATIVE  NEGATIVE Final    Comment:            The GeneXpert MRSA Assay (FDA     approved for NASAL specimens     only), is one component of a     comprehensive MRSA colonization     surveillance program. It is not     intended to diagnose MRSA     infection nor to guide or     monitor treatment for     MRSA infections.     Studies:  Recent x-ray studies have been reviewed in detail by the Attending Physician  Scheduled Meds:  Scheduled Meds: . albuterol  2.5 mg Nebulization Q4H   And  . ipratropium  0.5 mg Nebulization Q4H  . amLODipine  10 mg Oral Daily  . aspirin  81 mg Oral Daily  . busPIRone  5 mg Oral BID  . carvedilol  6.25 mg Oral BID WC  . cloNIDine  0.1 mg Oral BID  . enoxaparin (LOVENOX) injection  30 mg Subcutaneous Q24H  . ferrous sulfate  325 mg Oral BID  . hydrALAZINE  100 mg Oral TID  . isosorbide mononitrate  30 mg Oral Daily  . metoCLOPramide  5 mg Oral TID AC & HS  . pantoprazole  20 mg Oral QHS  . sertraline  100 mg Oral QHS  . simvastatin  20 mg Oral q1800  . sodium chloride  3 mL Intravenous Q12H  . torsemide  40 mg Oral BID   Continuous Infusions:   Time spent on care of this patient: 25 minutes   [facility] T  [facility] Office  [phone] Pager - Text Page per Loretha Stapler as per below:  On-Call/Text Page:      [email or web address]      password [email or web address]  If 7PM-7AM, please contact night-coverage [email or web address] Password [email or web address] 05/17/2012, 11:04 AM   LOS: 2 days

## 2012-05-17 NOTE — Progress Notes (Signed)
Patient ID: [name], female   DOB: 1940-05-09, 72 y.o.   MRN: [id number]    SUBJECTIVE: Breathing better this morning but not at baseline.  Gently diuresing. Renal function stable.   Marland Kitchen albuterol  2.5 mg Nebulization Q4H   And  . ipratropium  0.5 mg Nebulization Q4H  . amLODipine  10 mg Oral Daily  . aspirin  81 mg Oral Daily  . busPIRone  5 mg Oral BID  . carvedilol  6.25 mg Oral BID WC  . cloNIDine  0.1 mg Oral BID  . enoxaparin (LOVENOX) injection  30 mg Subcutaneous Q24H  . ferrous sulfate  325 mg Oral BID  . hydrALAZINE  100 mg Oral TID  . isosorbide mononitrate  30 mg Oral Daily  . metoCLOPramide  5 mg Oral TID AC & HS  . pantoprazole  20 mg Oral QHS  . sertraline  100 mg Oral QHS  . simvastatin  20 mg Oral q1800  . sodium chloride  3 mL Intravenous Q12H  . torsemide  40 mg Oral BID      Filed Vitals:   05/17/12 0010 05/17/12 0331 05/17/12 0445 05/17/12 0500  BP: 112/42 131/57    Pulse: 68 67 66   Temp:  99.2 F (37.3 C)    TempSrc:  Oral    Resp: 24 23 18    Weight:    178 lb 9.2 oz (81 kg)  SpO2: 98% 97% 97%     Intake/Output Summary (Last 24 hours) at 05/17/12 0803 Last data filed at 05/17/12 0400  Gross per 24 hour  Intake    486 ml  Output   1275 ml  Net   -789 ml    LABS: Basic Metabolic Panel:  Recent Labs  29/56/21 0505 05/17/12 0455  NA 141 137  K 3.5 3.9  CL 103 101  CO2 28 28  GLUCOSE 123* 94  BUN 46* 43*  CREATININE 2.78* 2.62*  CALCIUM 9.3 9.2   Liver Function Tests: No results found for this basename: AST, ALT, ALKPHOS, BILITOT, PROT, ALBUMIN,  in the last 72 hours No results found for this basename: LIPASE, AMYLASE,  in the last 72 hours CBC:  Recent Labs  05/15/12 0131 05/16/12 0505  WBC 6.8 7.5  NEUTROABS 4.5  --   HGB 10.7* 9.9*  HCT 34.4* 32.0*  MCV 80.0 80.0  PLT 169 178   Cardiac Enzymes:  Recent Labs  05/15/12 1138 05/15/12 1656 05/15/12 2304  TROPONINI <0.30 <0.30 <0.30   BNP: No components  found with this basename: POCBNP,  D-Dimer: No results found for this basename: DDIMER,  in the last 72 hours Hemoglobin A1C: No results found for this basename: HGBA1C,  in the last 72 hours Fasting Lipid Panel: No results found for this basename: CHOL, HDL, LDLCALC, TRIG, CHOLHDL, LDLDIRECT,  in the last 72 hours Thyroid Function Tests: No results found for this basename: TSH, T4TOTAL, FREET3, T3FREE, THYROIDAB,  in the last 72 hours Anemia Panel: No results found for this basename: VITAMINB12, FOLATE, FERRITIN, TIBC, IRON, RETICCTPCT,  in the last 72 hours  RADIOLOGY: Dg Chest 2 View  04/28/2012  *RADIOLOGY REPORT*  Clinical Data: Chronic cough, hypertension, shortness of breath  CHEST - 2 VIEW  Comparison: 03/17/2012  Findings: Cardiomegaly noted with central vascular congestion. Chronic left midlung scarring.  No definite superimposed CHF or pneumonia.  No large effusion or pneumothorax.  Tracheostomy in the mid trachea.  Atherosclerotic changes of the aorta.  IMPRESSION: Stable cardiomegaly  and vascular congestion.  Chronic left midlung scarring  No superimposed CHF or pneumonia   Original Report Authenticated By: Judie Petit. Miles Costain, M.D.    Dg Chest Port 1 View  05/15/2012  *RADIOLOGY REPORT*  Clinical Data: Short of breath.  Tracheostomy.  COPD.  Check tracheostomy position.  PORTABLE CHEST - 1 VIEW  Comparison: 04/28/2012.  Findings: Chronic elevation of the left hemidiaphragm with subsegmental atelectasis.  The tracheostomy projects over the midline thoracic inlet.  The tip overlies the tracheal air column. There is no airspace disease or pleural effusion identified.  The cardiopericardial silhouette appears unchanged allowing for elevation of the left hemidiaphragm.  IMPRESSION: Tracheostomy appears in the midline over the thoracic inlet.  Tip terminates over the tracheal air column.  Chronic elevation of the left hemidiaphragm, slightly more prominent on today's exam with associated atelectasis.    Original Report Authenticated By: Andreas Newport, M.D.     PHYSICAL EXAM General: NAD Neck: JVP 10 cm, no thyromegaly or thyroid nodule.  Lungs: Rhonchi bilaterally, decreased breath sounds left base. CV: Nondisplaced PMI.  Heart regular S1/S2, no S3/S4, no murmur.  No peripheral edema.  No carotid bruit.  Normal pedal pulses.  Abdomen: Soft, nontender, no hepatosplenomegaly, no distention.  Neurologic: Alert.  Psych: Depressed affect. Extremities: No clubbing or cyanosis.   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  1. Dyspnea: This seems improved after suctioning of mucus plug.  ? Underlying bronchitis. Per primary team/pulmonary.  She may be close to her baseline.  2. CHF: Primarily diastolic CHF, possibly mild acute on chronic diastolic CHF though she really does not look markely volume overloaded.  I do not think this is the primary cause of her presentation.  - Continue torsemide at 40 mg bid.  3. OHS/OSA: Has trach, oxygen dependent.  4. CKD: Stable so far.  Follow closely since I increased her torsemide.  [name] 05/17/2012 8:03 AM

## 2012-05-18 ENCOUNTER — Inpatient Hospital Stay (HOSPITAL_COMMUNITY): Payer: Medicare Other

## 2012-05-18 DIAGNOSIS — N189 Chronic kidney disease, unspecified: Secondary | ICD-10-CM

## 2012-05-18 DIAGNOSIS — N179 Acute kidney failure, unspecified: Secondary | ICD-10-CM

## 2012-05-18 LAB — BASIC METABOLIC PANEL
BUN: 42 mg/dL — ABNORMAL HIGH (ref 6–23)
Calcium: 9.4 mg/dL (ref 8.4–10.5)
GFR calc Af Amer: 20 mL/min — ABNORMAL LOW (ref >90)
GFR calc non Af Amer: 17 mL/min — ABNORMAL LOW (ref >90)
Glucose, Bld: 94 mg/dL (ref 70–99)
Potassium: 3.3 mEq/L — ABNORMAL LOW (ref 3.5–5.1)
Sodium: 138 mEq/L (ref 135–145)

## 2012-05-18 MED ORDER — ALBUTEROL SULFATE (5 MG/ML) 0.5% IN NEBU
2.5000 mg | INHALATION_SOLUTION | RESPIRATORY_TRACT | Status: DC | PRN
  Administered 2012-05-18: 2.5 mg via RESPIRATORY_TRACT

## 2012-05-18 MED ORDER — ALBUTEROL SULFATE (5 MG/ML) 0.5% IN NEBU
INHALATION_SOLUTION | RESPIRATORY_TRACT | Status: AC
  Filled 2012-05-18: qty 0.5

## 2012-05-18 NOTE — Progress Notes (Signed)
PT Cancellation Note  Patient Details Name: [name] MRN: [id number] DOB: 1940-03-13   Cancelled Treatment:    Reason Eval/Treat Not Completed: Other (comment); patient refusing out of bed at this time.  Did encourage participation to improve clearance in lungs and for maintaining mobility.  Patient continued to refuse claiming she will do it when she gets home.  Will check on pt one more attempt on tomorrow.   WYNN,CYNDI 05/18/2012, 2:31 PM

## 2012-05-18 NOTE — Progress Notes (Signed)
[facility] Progress Note  [name] MVH:[id number] DOB: [birth year] DOA: 05/15/2012 PCP: Gwynneth Aliment, MD  Brief narrative: 72 year old female status post tracheostomy, COPD, cor pulmonale, CKD stage IV, cardiomyopathy, diabetes mellitus who presented to the ER with a complaint of shortness of breath associated with substernal chest pressure. The patient has a tracheostomy but does not wear trach collar and chooses to wear nasal cannula. After suctioning of her trachea and administration of bronchodilators her dyspnea improved but she stated that she was too uncomfortable to go home. It was noted that she had new T wave abnormalities on her EKG and therefore she was admitted and a Cardiology consult requested.  Assessment/Plan:  Acute-on-chronic respiratory failure -Patient was admitted with a similar episode earlier this month and stabilized quickly -Suspect her dyspnea is related to COPD/obstructive sleep apnea and tracheostomy complications with mucous plugging -Dr. Shirlee Latch (cardiology) has also adjusted her diuretic with the suspicion that she is fluid overloaded -She is at her baseline-She feels anxious about going home. -Have called her daughter [name], but she was unable to speak with me as she was at work. I did inform her that her mother was likely to be discharged home in the am.  Sleep apnea -the reason for her chronic trach  Acute exacerbation of chronic diastolic CHF - hypertensive heart disease -Last EF measured to be 45- 50% with severe LVH, diffuse hypokinesis of the LV and grade 1 diastolic dysfunction -As mentioned above she is currently being diuresed per cardiology -2.3 L negative since admission -Continue antihypertensives - BP stable  COPD Currently stable  CKD stage IV Stable - baseline crt ~2.3  Bedbound status   Code Status: Full code  Family Communication: No family present at the time of my exam Disposition Plan: Likely DC home in  am.  Consultants: Cardiology  Procedures: None  Antibiotics: None  DVT prophylaxis: Lovenox  HPI/Subjective: In bed.  Respirations appear comfortable.  Patient reports she "doesn't feel good" but can't provide further detail.  She denies chest pain fevers chills nausea vomiting or abdominal pain.  Objective: Blood pressure 130/62, pulse 69, temperature 98.5 F (36.9 C), temperature source Oral, resp. rate 20, height 5\' 4"  (1.626 m), weight 79.6 kg (175 lb 7.8 oz), SpO2 99.00%.  Intake/Output Summary (Last 24 hours) at 05/18/12 1608 Last data filed at 05/18/12 1248  Gross per 24 hour  Intake    840 ml  Output   1275 ml  Net   -435 ml   Exam: General: No acute respiratory distress evident to exam Lungs: Distant breath sounds in all fields with no focal crackles and no appreciable wheeze Cardiovascular: Regular rate and rhythm without murmur gallop or rub normal S1 and S2 Abdomen: Nontender, nondistended, soft, bowel sounds positive, no rebound, no ascites, no appreciable mass Extremities: No significant cyanosis, clubbing, or edema bilateral lower extremities  Data Reviewed: Basic Metabolic Panel:  Recent Labs Lab 05/15/12 0131 05/15/12 1138 05/16/12 0505 05/17/12 0455 05/18/12 0510  NA 144  --  141 137 138  K 3.5  --  3.5 3.9 3.3*  CL 105  --  103 101 100  CO2 29  --  28 28 30   GLUCOSE 103*  --  123* 94 94  BUN 45*  --  46* 43* 42*  CREATININE 2.60* 2.55* 2.78* 2.62* 2.60*  CALCIUM 9.2  --  9.3 9.2 9.4   CBC:  Recent Labs Lab 05/15/12 0131 05/16/12 0505  WBC 6.8 7.5  NEUTROABS 4.5  --  HGB 10.7* 9.9*  HCT 34.4* 32.0*  MCV 80.0 80.0  PLT 169 178   Cardiac Enzymes:  Recent Labs Lab 05/15/12 1138 05/15/12 1656 05/15/12 2304  TROPONINI <0.30 <0.30 <0.30   BNP (last 3 results)  Recent Labs  02/14/12 2127 04/28/12 1611 05/15/12 0741  PROBNP 10254.0* 3133.0* 1451.0*   CBG:  Recent Labs Lab 05/16/12 2131 05/17/12 0824 05/17/12 1225  05/17/12 2057 05/18/12 0648  GLUCAP 81 94 94 98 93    Recent Results (from the past 240 hour(s))  MRSA PCR SCREENING     Status: None   Collection Time    05/15/12 11:10 AM      Result Value Range Status   MRSA by PCR NEGATIVE  NEGATIVE Final   Comment:            The GeneXpert MRSA Assay (FDA     approved for NASAL specimens     only), is one component of a     comprehensive MRSA colonization     surveillance program. It is not     intended to diagnose MRSA     infection nor to guide or     monitor treatment for     MRSA infections.     Studies:  Recent x-ray studies have been reviewed in detail by the Attending Physician  Scheduled Meds:  Scheduled Meds: . ipratropium  0.5 mg Nebulization QID   And  . albuterol  2.5 mg Nebulization QID  . amLODipine  10 mg Oral Daily  . aspirin  81 mg Oral Daily  . busPIRone  5 mg Oral BID  . carvedilol  6.25 mg Oral BID WC  . cloNIDine  0.1 mg Oral BID  . enoxaparin (LOVENOX) injection  30 mg Subcutaneous Q24H  . ferrous sulfate  325 mg Oral BID  . hydrALAZINE  100 mg Oral TID  . isosorbide mononitrate  30 mg Oral Daily  . metoCLOPramide  5 mg Oral TID AC & HS  . pantoprazole  20 mg Oral QHS  . sertraline  100 mg Oral QHS  . simvastatin  20 mg Oral q1800  . sodium chloride  3 mL Intravenous Q12H  . torsemide  40 mg Oral BID   Continuous Infusions:   Time spent on care of this patient: 25 minutes   Chaya Jan Pager: [phone] [facility]  Office  [phone] Pager - Text Page per Loretha Stapler as per below:  On-Call/Text Page:      [email or web address]      password [email or web address]  If 7PM-7AM, please contact night-coverage [email or web address] Password [email or web address] 05/18/2012, 4:08 PM   LOS: 3 days

## 2012-05-18 NOTE — Progress Notes (Signed)
PT Cancellation Note  Patient Details Name: [name] MRN: [id number] DOB: [birth year]   Cancelled Treatment:    Reason Eval/Treat Not Completed: Fatigue/lethargy limiting ability to participate; reports already up to edge of bed and BSC this morning.  Will return later today to complete evaluation as tolerated.   WYNN,CYNDI 05/18/2012, 11:31 AM

## 2012-05-18 NOTE — Evaluation (Signed)
Occupational Therapy Evaluation Patient Details Name: [name] MRN: [id number] DOB: 26-Aug-1940 Today's Date: 05/18/2012 Time: 7846-9629 OT Time Calculation (min): 15 min  OT Assessment / Plan / Recommendation Clinical Impression  Pt is a 73 yo female admitted with acutre on chronic respiratory failure with trach.  Pt with deficits listed below.  Pt has been in and out of the hospital recently and has a history of being resistive to OT.  Will trial this pt with OT and will continue if participation is fair to good.  It is very difficult to know what this pt's baseline is due to her lack of participation.  Goals are to increase I with basic adls so she can return home.    OT Assessment  Patient needs continued OT Services    Follow Up Recommendations  Home health OT;Supervision/Assistance - 24 hour    Barriers to Discharge None Pt will need 24 hour S.  Equipment Recommendations  3 in 1 bedside comode;Other (comment) (need to see if she got one from [facility]    Recommendations for Other Services    Frequency  Min 2X/week    Precautions / Restrictions Precautions Precautions: Fall Precaution Comments: Trach collar Restrictions Weight Bearing Restrictions: No   Pertinent Vitals/Pain No c/o pain.    ADL  Eating/Feeding: Simulated;Set up Where Assessed - Eating/Feeding: Chair Grooming: Wash/dry hands;Wash/dry face;Performed;Set up Where Assessed - Grooming: Supine, head of bed up Upper Body Bathing: Simulated;Minimal assistance Where Assessed - Upper Body Bathing: Supported sitting Lower Body Bathing: Simulated;Maximal assistance Where Assessed - Lower Body Bathing: Supported sit to stand Upper Body Dressing: Simulated;Set up Where Assessed - Upper Body Dressing: Supported sitting Lower Body Dressing: Simulated;+1 Total assistance;Other (comment) (not sure if pt just did not want to try or wasn't able) Where Assessed - Lower Body Dressing: Supported sit to  stand Toilet Transfer: Other (comment);Moderate assistance;Simulated (pt refused but based on standing feel mod assist) Toilet Transfer Method: Sit to stand Toileting - Clothing Manipulation and Hygiene: Simulated;+1 Total assistance Where Assessed - Toileting Clothing Manipulation and Hygiene: Standing Transfers/Ambulation Related to ADLs: Pt refused transfers or ambulation. ADL Comments: Pt reluctantly came to side of bed. pt refused to stand but finally did so simply to move up to top of bed so she could lay down easier.    OT Diagnosis: Generalized weakness  OT Problem List: Decreased strength;Decreased activity tolerance;Cardiopulmonary status limiting activity;Obesity OT Treatment Interventions: Self-care/ADL training;Therapeutic activities;DME and/or AE instruction;Patient/family education;Energy conservation   OT Goals Acute Rehab OT Goals OT Goal Formulation: With patient Time For Goal Achievement: 06/01/12 Potential to Achieve Goals: Good ADL Goals Pt Will Perform Upper Body Bathing: with supervision;Sitting, chair ADL Goal: Upper Body Bathing - Progress: Goal set today Pt Will Perform Lower Body Bathing: with min assist;Sit to stand from bed;Sit to stand from chair ADL Goal: Lower Body Bathing - Progress: Goal set today Pt Will Transfer to Toilet: with min assist;Ambulation;Comfort height toilet;Grab bars ADL Goal: Toilet Transfer - Progress: Goal set today Pt Will Perform Toileting - Clothing Manipulation: with min assist;Standing ADL Goal: Toileting - Clothing Manipulation - Progress: Goal set today Additional ADL Goal #1: Pt will actively participate in 20 mins of therapeutic activity with no more than one rest break ADL Goal: Additional Goal #1 - Progress: Goal set today  Visit Information  Last OT Received On: 05/18/12 Assistance Needed: +1    Subjective Data  Subjective: " I am not getting up.Marland KitchenMarland KitchenI know that is what you want me  to do and I won't do it." Patient  Stated Goal: to breathe well   Prior Functioning     Home Living Lives With: Daughter Available Help at Discharge: Family;Available 24 hours/day Type of Home: Apartment Home Access: Level entry Home Layout: One level Bathroom Shower/Tub: Tub/shower unit;Curtain Bathroom Toilet: Standard Home Adaptive Equipment: Straight cane;Walker - rolling;Hospital bed;Wheelchair - manual Additional Comments: Pt very resistive to any therapy and states she just needs to stay in bed.  Explained to her the need to get out of bed and keep up her strength but pt remained resistive. Prior Function Level of Independence: Needs assistance Needs Assistance: Dressing;Bathing;Meal Prep;Light Housekeeping Bath: Minimal Dressing: Minimal Toileting: Minimal Meal Prep: Total Light Housekeeping: Total Gait Assistance: Pt reports using w/c and cane in house depending on how she feels. Able to Take Stairs?: No Driving: No Vocation: Retired Musician: Passy-Muir valve;Tracheostomy Dominant Hand: Right         Vision/Perception Vision - Assessment Vision Assessment: Vision not tested   [employer] Overall Cognitive Status: Difficult to assess Difficult to assess due to: Other (comment) (feel it is intact but pt reluctant to answer questions.) Arousal/Alertness: Awake/alert Orientation Level: Appears intact for tasks assessed Behavior During Session: Flat affect Cognition - Other Comments: Pt. answered only questions asked of her with prompting.  Pt at times refuses to answer questions, and does not volunteer information.  Unable to asses if this is normal behavior, or if there is a component of cognitive deficit contributing    Extremity/Trunk Assessment Right Upper Extremity Assessment RUE ROM/Strength/Tone: Within functional levels RUE Sensation: WFL - Light Touch RUE Coordination: WFL - gross/fine motor Left Upper Extremity Assessment LUE ROM/Strength/Tone: Within  functional levels LUE Sensation: WFL - Light Touch LUE Coordination: WFL - gross/fine motor     Mobility Bed Mobility Bed Mobility: Supine to Sit;Sit to Supine Supine to Sit: 5: Supervision;HOB elevated Sit to Supine: 5: Supervision;HOB elevated Scooting to [facility]: 5: Supervision Details for Bed Mobility Assistance: only encouragement for participation needed. Transfers Transfers: Sit to Stand;Stand to Sit Sit to Stand: 3: Mod assist Stand to Sit: 3: Mod assist Details for Transfer Assistance: unsure of pts level.  Pt very against standing but finally did with encouragment just to move to top of bed.  Pt gave very little effort to stand compared to how easily she came to EOB.     Exercise     Balance     End of Session OT - End of Session Activity Tolerance: Other (comment) (self limiting) Patient left: in bed;with call bell/phone within reach Nurse Communication: Mobility status  GO     Hope Budds 05/18/2012, 11:46 AM [phone]

## 2012-05-18 NOTE — Progress Notes (Signed)
[facility]  Patient Status: Active (receiving services up to time of hospitalization)  [facility] is providing the following services: RN, PT and OT  If patient discharges after hours, please call [phone].   [name] 05/18/2012, 12:20 PM

## 2012-05-19 LAB — BASIC METABOLIC PANEL
BUN: 44 mg/dL — ABNORMAL HIGH (ref 6–23)
Calcium: 9 mg/dL (ref 8.4–10.5)
GFR calc non Af Amer: 18 mL/min — ABNORMAL LOW (ref >90)
Glucose, Bld: 95 mg/dL (ref 70–99)

## 2012-05-19 MED ORDER — SENNOSIDES-DOCUSATE SODIUM 8.6-50 MG PO TABS
1.0000 | ORAL_TABLET | Freq: Two times a day (BID) | ORAL | Status: DC | PRN
  Administered 2012-05-19: 1 via ORAL
  Filled 2012-05-19: qty 1

## 2012-05-19 MED ORDER — CARVEDILOL 6.25 MG PO TABS: 6.2500 mg | ORAL_TABLET | Freq: Two times a day (BID) | ORAL | Status: DC

## 2012-05-19 MED ORDER — ISOSORBIDE MONONITRATE ER 30 MG PO TB24: 30.0000 mg | ORAL_TABLET | Freq: Every day | ORAL | Status: DC

## 2012-05-19 NOTE — Plan of Care (Signed)
Problem: Phase I Progression Outcomes Goal: Initial discharge plan identified Outcome: Completed/Met Date Met:  05/19/12 Initial plan is to return home with daughter  Problem: Phase III Progression Outcomes Goal: Pain controlled on oral analgesia Outcome: Completed/Met Date Met:  05/19/12 Pt has not had any c/o pain Goal: Voiding independently Outcome: Completed/Met Date Met:  05/19/12 Pt voiding adequate amts of urine Goal: IV/normal saline lock discontinued Outcome: Completed/Met Date Met:  05/19/12 IV d/c'd Goal: Discharge plan remains appropriate-arrangements made Outcome: Completed/Met Date Met:  05/19/12 Pt to be d/c to home with home heath to assist with trach care

## 2012-05-19 NOTE — Care Management Note (Signed)
    Page 1 of 1   05/19/2012     2:54:25 PM   CARE MANAGEMENT NOTE 05/19/2012  Patient:  [name], [name]   Account Number:  [id number]  Date Initiated:  05/19/2012  Documentation initiated by:  [facility]  Subjective/Objective Assessment:   72 y.o. female h/o cardio pulmonary arrest recently, s/p trach,  s/p recent admission for acute on chronic respirator failure     Action/Plan:   Home alone/ Home with Home Heat[facility]   Anticipated DC Date:  05/19/2012   Anticipated DC Plan:  HOME W HOME HEALTH SERVICES      DC Planning Services  CM consult      [facility] Choice  HOME HEALTH   Choice offered to / List presented to:  C-1 Patient        HH arranged  HH-1 RN      [facility] agency  [facility]   Status of service:  Completed, signed off Medicare Important Message given?   (If response is "NO", the following Medicare IM given date fields will be blank) Date Medicare IM given:   Date Additional Medicare IM given:    Discharge Disposition:    Per UR Regulation:    If discussed at Long Length of Stay Meetings, dates discussed:    Comments:  05/19/12 @ 1415.Marland KitchenMarland KitchenOletta Cohn, RN,BSN, [place] [phone] Spoke to pt regarding discharge planning.  Pt is current with [facility] and would like to continue with them.  Lupita Leash of [facility] notified.

## 2012-05-19 NOTE — Progress Notes (Signed)
Physical Therapy Discharge Patient Details Name: [name] MRN: [id number] DOB: Jul 03, 1940 Today's Date: 05/19/2012 Time:  -     Patient discharged from PT services secondary to patient has refused 3 (three) consecutive times without medical reason.  Patient reports getting up to bedside commode independently during this hospitalization.  Recommend resuming HHPT upon d/c as already active patient.  Progress and discharge plan discussed with patient and/or caregiver: Patient/Caregiver agrees with plan  GP     [facility] 05/19/2012, 11:15 AM

## 2012-05-19 NOTE — Discharge Summary (Signed)
Physician Discharge Summary  Patient ID: [name] MRN: [id number] DOB/AGE: 09-23-1940 72 y.o.  Admit date: 05/15/2012 Discharge date: 05/19/2012  Primary Care Physician:  Gwynneth Aliment, MD   Discharge Diagnoses:    Principal Problem:   Acute-on-chronic respiratory failure Active Problems:   Type 2 diabetes mellitus with vascular disease   Obesity (BMI 30-39.9)   Sleep apnea   Hypertensive heart disease with congestive heart failure   C O P D   Cardiomyopathy of undetermined type   Tracheostomy status      Medication List    STOP taking these medications       chlorhexidine 0.12 % solution  Commonly known as:  PERIDEX      TAKE these medications       amLODipine 10 MG tablet  Commonly known as:  NORVASC  Take 10 mg by mouth daily.     aspirin 81 MG chewable tablet  Chew 1 tablet (81 mg total) by mouth daily.     busPIRone 5 MG tablet  Commonly known as:  BUSPAR  Take 5 mg by mouth 2 (two) times daily.     carvedilol 6.25 MG tablet  Commonly known as:  COREG  Take 1 tablet (6.25 mg total) by mouth 2 (two) times daily with a meal.     cloNIDine 0.1 MG tablet  Commonly known as:  CATAPRES  Take 0.1 mg by mouth 2 (two) times daily.     ferrous sulfate 325 (65 FE) MG EC tablet  Take 325 mg by mouth 2 (two) times daily.     hydrALAZINE 100 MG tablet  Commonly known as:  APRESOLINE  Take 100 mg by mouth 3 (three) times daily.     isosorbide mononitrate 30 MG 24 hr tablet  Commonly known as:  IMDUR  Take 1 tablet (30 mg total) by mouth daily.     metoCLOPramide 5 MG tablet  Commonly known as:  REGLAN  Take 5 mg by mouth 4 (four) times daily.     oxycodone 5 MG capsule  Commonly known as:  OXY-IR  Take 5 mg by mouth every 6 (six) hours as needed (for moderate pain).     pantoprazole 20 MG tablet  Commonly known as:  PROTONIX  Take 20 mg by mouth every evening.     pravastatin 40 MG tablet  Commonly known as:  PRAVACHOL  Take 40 mg by  mouth daily.     sennosides-docusate sodium 8.6-50 MG tablet  Commonly known as:  SENOKOT-S  Take 1 tablet by mouth 2 (two) times daily as needed for constipation.     sertraline 100 MG tablet  Commonly known as:  ZOLOFT  Take 100 mg by mouth at bedtime.     torsemide 20 MG tablet  Commonly known as:  DEMADEX  Take 20 mg by mouth 2 (two) times daily.         Disposition and Follow-up:  Will be discharged home today in stable and improved condition. Advised to follow up with her PCP in 2 weeks.  Consults:  Cardiology, Dr. Shirlee Latch   Significant Diagnostic Studies:  Dg Chest Port 1 View  05/18/2012  *RADIOLOGY REPORT*  Clinical Data: Respiratory distress.  Congestion.  PORTABLE CHEST - 1 VIEW  Comparison: Chest 05/15/2012.  Findings: Tracheostomy tube is unchanged.  There is cardiomegaly without pulmonary edema.  Elevation of the left hemidiaphragm and left basilar airspace disease appear unchanged.  Right lung is clear.  No pneumothorax.  IMPRESSION: No  change in left basilar airspace disease and cardiomegaly without pulmonary edema.   Original Report Authenticated By: Holley Dexter, M.D.     Brief H and P: For complete details please refer to admission H and P, but in brief  Patient is a 72 y.o. female witH h/o cardio pulmonary arrest recently, s/p trach, s/p recent admission for acute on chronic respirator failure and discharged on 3/14 came in today for worsening sob since Saturday associated with some chest pressure substernally. She denies any sharp chest pain . On arrival to the ED, she was on 4 lit nasal canula oxygen, and she reports her sob improved with bronchodilators and suctioning of her trachea. Patient reports even though she is feeling little better, she doesn't feel comfortable going home. She was also found to have new T WAVE abnormalities on her EKG. We will admit her to medical service for evaluation of ACS, with cardiac enzymes and get cardiology consultation to  assist [place].      Hospital Course:  Principal Problem:   Acute-on-chronic respiratory failure Active Problems:   Type 2 diabetes mellitus with vascular disease   Obesity (BMI 30-39.9)   Sleep apnea   Hypertensive heart disease with congestive heart failure   C O P D   Cardiomyopathy of undetermined type   Tracheostomy status    Acute-on-chronic respiratory failure  -Patient was admitted with a similar episode earlier this month and stabilized quickly  -Suspect her dyspnea is related to COPD/obstructive sleep apnea and tracheostomy complications with mucous plugging  -Dr. Shirlee Latch (cardiology) has also adjusted her diuretic with the suspicion that she is fluid overloaded    Sleep apnea  -the reason for her chronic trach   Acute exacerbation of chronic diastolic CHF - hypertensive heart disease  -Last EF measured to be 45- 50% with severe LVH, diffuse hypokinesis of the LV and grade 1 diastolic dysfunction   -4.8 L negative since admission  -Continue antihypertensives - BP stable   COPD  Currently stable   CKD stage IV  Stable - baseline crt ~2.3       Time spent on Discharge: Greater than 30 minutes.  SignedChaya Jan [facility] Pager: [phone] 05/19/2012, 4:04 PM

## 2012-05-22 ENCOUNTER — Other Ambulatory Visit: Payer: Self-pay | Admitting: Internal Medicine

## 2012-06-04 ENCOUNTER — Encounter (HOSPITAL_COMMUNITY): Payer: Self-pay

## 2012-06-04 ENCOUNTER — Inpatient Hospital Stay (HOSPITAL_COMMUNITY)
Admission: EM | Admit: 2012-06-04 | Discharge: 2012-06-12 | DRG: 205 | Disposition: A | Discharge status: Home Health | Payer: Medicare Other | Attending: Internal Medicine | Admitting: Internal Medicine

## 2012-06-04 ENCOUNTER — Emergency Department (HOSPITAL_COMMUNITY): Payer: Medicare Other

## 2012-06-04 DIAGNOSIS — Z8249 Family history of ischemic heart disease and other diseases of the circulatory system: Secondary | ICD-10-CM

## 2012-06-04 DIAGNOSIS — K7460 Unspecified cirrhosis of liver: Secondary | ICD-10-CM | POA: Diagnosis present

## 2012-06-04 DIAGNOSIS — Z6830 Body mass index (BMI) 30.0-30.9, adult: Secondary | ICD-10-CM

## 2012-06-04 DIAGNOSIS — J9610 Chronic respiratory failure, unspecified whether with hypoxia or hypercapnia: Secondary | ICD-10-CM

## 2012-06-04 DIAGNOSIS — I469 Cardiac arrest, cause unspecified: Secondary | ICD-10-CM | POA: Diagnosis present

## 2012-06-04 DIAGNOSIS — E1159 Type 2 diabetes mellitus with other circulatory complications: Secondary | ICD-10-CM | POA: Diagnosis present

## 2012-06-04 DIAGNOSIS — G4730 Sleep apnea, unspecified: Secondary | ICD-10-CM | POA: Diagnosis present

## 2012-06-04 DIAGNOSIS — I509 Heart failure, unspecified: Secondary | ICD-10-CM | POA: Diagnosis present

## 2012-06-04 DIAGNOSIS — J9509 Other tracheostomy complication: Principal | ICD-10-CM | POA: Diagnosis present

## 2012-06-04 DIAGNOSIS — J9500 Unspecified tracheostomy complication: Secondary | ICD-10-CM | POA: Diagnosis present

## 2012-06-04 DIAGNOSIS — Z8673 Personal history of transient ischemic attack (TIA), and cerebral infarction without residual deficits: Secondary | ICD-10-CM

## 2012-06-04 DIAGNOSIS — I5023 Acute on chronic systolic (congestive) heart failure: Secondary | ICD-10-CM | POA: Diagnosis present

## 2012-06-04 DIAGNOSIS — Y92009 Unspecified place in unspecified non-institutional (private) residence as the place of occurrence of the external cause: Secondary | ICD-10-CM

## 2012-06-04 DIAGNOSIS — E1169 Type 2 diabetes mellitus with other specified complication: Secondary | ICD-10-CM | POA: Diagnosis present

## 2012-06-04 DIAGNOSIS — R197 Diarrhea, unspecified: Secondary | ICD-10-CM | POA: Diagnosis not present

## 2012-06-04 DIAGNOSIS — Z8674 Personal history of sudden cardiac arrest: Secondary | ICD-10-CM

## 2012-06-04 DIAGNOSIS — E785 Hyperlipidemia, unspecified: Secondary | ICD-10-CM | POA: Diagnosis present

## 2012-06-04 DIAGNOSIS — I130 Hypertensive heart and chronic kidney disease with heart failure and stage 1 through stage 4 chronic kidney disease, or unspecified chronic kidney disease: Secondary | ICD-10-CM | POA: Diagnosis present

## 2012-06-04 DIAGNOSIS — I472 Ventricular tachycardia, unspecified: Secondary | ICD-10-CM | POA: Diagnosis not present

## 2012-06-04 DIAGNOSIS — R0902 Hypoxemia: Secondary | ICD-10-CM | POA: Diagnosis present

## 2012-06-04 DIAGNOSIS — Z79899 Other long term (current) drug therapy: Secondary | ICD-10-CM

## 2012-06-04 DIAGNOSIS — D638 Anemia in other chronic diseases classified elsewhere: Secondary | ICD-10-CM | POA: Diagnosis present

## 2012-06-04 DIAGNOSIS — N184 Chronic kidney disease, stage 4 (severe): Secondary | ICD-10-CM | POA: Diagnosis present

## 2012-06-04 DIAGNOSIS — Z7982 Long term (current) use of aspirin: Secondary | ICD-10-CM

## 2012-06-04 DIAGNOSIS — J9620 Acute and chronic respiratory failure, unspecified whether with hypoxia or hypercapnia: Secondary | ICD-10-CM | POA: Diagnosis present

## 2012-06-04 DIAGNOSIS — I110 Hypertensive heart disease with heart failure: Secondary | ICD-10-CM | POA: Diagnosis present

## 2012-06-04 DIAGNOSIS — I999 Unspecified disorder of circulatory system: Secondary | ICD-10-CM | POA: Diagnosis present

## 2012-06-04 DIAGNOSIS — E669 Obesity, unspecified: Secondary | ICD-10-CM | POA: Diagnosis present

## 2012-06-04 DIAGNOSIS — Y833 Surgical operation with formation of external stoma as the cause of abnormal reaction of the patient, or of later complication, without mention of misadventure at the time of the procedure: Secondary | ICD-10-CM | POA: Diagnosis present

## 2012-06-04 DIAGNOSIS — D649 Anemia, unspecified: Secondary | ICD-10-CM | POA: Diagnosis present

## 2012-06-04 DIAGNOSIS — J449 Chronic obstructive pulmonary disease, unspecified: Secondary | ICD-10-CM | POA: Diagnosis present

## 2012-06-04 DIAGNOSIS — J189 Pneumonia, unspecified organism: Secondary | ICD-10-CM | POA: Diagnosis present

## 2012-06-04 DIAGNOSIS — Z882 Allergy status to sulfonamides status: Secondary | ICD-10-CM

## 2012-06-04 DIAGNOSIS — G9340 Encephalopathy, unspecified: Secondary | ICD-10-CM

## 2012-06-04 DIAGNOSIS — I4729 Other ventricular tachycardia: Secondary | ICD-10-CM | POA: Diagnosis not present

## 2012-06-04 DIAGNOSIS — E876 Hypokalemia: Secondary | ICD-10-CM | POA: Diagnosis not present

## 2012-06-04 DIAGNOSIS — I428 Other cardiomyopathies: Secondary | ICD-10-CM | POA: Diagnosis present

## 2012-06-04 DIAGNOSIS — K219 Gastro-esophageal reflux disease without esophagitis: Secondary | ICD-10-CM | POA: Diagnosis present

## 2012-06-04 DIAGNOSIS — T17408A Unspecified foreign body in trachea causing other injury, initial encounter: Secondary | ICD-10-CM | POA: Diagnosis present

## 2012-06-04 DIAGNOSIS — I452 Bifascicular block: Secondary | ICD-10-CM | POA: Diagnosis present

## 2012-06-04 DIAGNOSIS — Z823 Family history of stroke: Secondary | ICD-10-CM

## 2012-06-04 DIAGNOSIS — Z930 Tracheostomy status: Secondary | ICD-10-CM

## 2012-06-04 DIAGNOSIS — Z87891 Personal history of nicotine dependence: Secondary | ICD-10-CM

## 2012-06-04 DIAGNOSIS — J4489 Other specified chronic obstructive pulmonary disease: Secondary | ICD-10-CM | POA: Diagnosis present

## 2012-06-04 DIAGNOSIS — I2510 Atherosclerotic heart disease of native coronary artery without angina pectoris: Secondary | ICD-10-CM | POA: Diagnosis present

## 2012-06-04 DIAGNOSIS — IMO0002 Reserved for concepts with insufficient information to code with codable children: Secondary | ICD-10-CM | POA: Diagnosis present

## 2012-06-04 DIAGNOSIS — G4733 Obstructive sleep apnea (adult) (pediatric): Secondary | ICD-10-CM | POA: Diagnosis present

## 2012-06-04 DIAGNOSIS — I279 Pulmonary heart disease, unspecified: Secondary | ICD-10-CM | POA: Diagnosis present

## 2012-06-04 LAB — GLUCOSE, CAPILLARY: Glucose-Capillary: 91 mg/dL (ref 70–99)

## 2012-06-04 LAB — URINALYSIS, ROUTINE W REFLEX MICROSCOPIC
Glucose, UA: NEGATIVE mg/dL
Ketones, ur: NEGATIVE mg/dL
Leukocytes, UA: NEGATIVE
Nitrite: NEGATIVE
Specific Gravity, Urine: 1.013 (ref 1.005–1.030)
pH: 5.5 (ref 5.0–8.0)

## 2012-06-04 LAB — URINE MICROSCOPIC-ADD ON

## 2012-06-04 LAB — BASIC METABOLIC PANEL
BUN: 27 mg/dL — ABNORMAL HIGH (ref 6–23)
Calcium: 9.8 mg/dL (ref 8.4–10.5)
GFR calc Af Amer: 28 mL/min — ABNORMAL LOW (ref >90)
GFR calc non Af Amer: 24 mL/min — ABNORMAL LOW (ref >90)
Potassium: 5.8 mEq/L — ABNORMAL HIGH (ref 3.5–5.1)

## 2012-06-04 LAB — CBC WITH DIFFERENTIAL/PLATELET
Basophils Relative: 0 % (ref 0–1)
Eosinophils Absolute: 0.1 10*3/uL (ref 0.0–0.7)
Eosinophils Relative: 1 % (ref 0–5)
MCH: 25.3 pg — ABNORMAL LOW (ref 26.0–34.0)
MCHC: 30.2 g/dL (ref 30.0–36.0)
Monocytes Relative: 7 % (ref 3–12)
Neutrophils Relative %: 70 % (ref 43–77)
Platelets: 139 10*3/uL — ABNORMAL LOW (ref 150–400)

## 2012-06-04 LAB — MRSA PCR SCREENING: MRSA by PCR: NEGATIVE

## 2012-06-04 LAB — CG4 I-STAT (LACTIC ACID): Lactic Acid, Venous: 1.51 mmol/L (ref 0.5–2.2)

## 2012-06-04 MED ORDER — ALBUTEROL SULFATE (5 MG/ML) 0.5% IN NEBU
2.5000 mg | INHALATION_SOLUTION | RESPIRATORY_TRACT | Status: DC | PRN
  Filled 2012-06-04: qty 0.5

## 2012-06-04 MED ORDER — SERTRALINE HCL 100 MG PO TABS
100.0000 mg | ORAL_TABLET | Freq: Every day | ORAL | Status: DC
  Administered 2012-06-04 – 2012-06-11 (×8): 100 mg via ORAL
  Filled 2012-06-04 (×9): qty 1

## 2012-06-04 MED ORDER — PIPERACILLIN-TAZOBACTAM 3.375 G IVPB 30 MIN
3.3750 g | Freq: Once | INTRAVENOUS | Status: AC
  Administered 2012-06-04: 3.375 g via INTRAVENOUS
  Filled 2012-06-04: qty 50

## 2012-06-04 MED ORDER — AMLODIPINE BESYLATE 10 MG PO TABS
10.0000 mg | ORAL_TABLET | Freq: Every day | ORAL | Status: DC
  Administered 2012-06-04 – 2012-06-12 (×9): 10 mg via ORAL
  Filled 2012-06-04 (×11): qty 1

## 2012-06-04 MED ORDER — ENOXAPARIN SODIUM 30 MG/0.3ML ~~LOC~~ SOLN
30.0000 mg | SUBCUTANEOUS | Status: DC
  Administered 2012-06-04 – 2012-06-11 (×8): 30 mg via SUBCUTANEOUS
  Filled 2012-06-04 (×9): qty 0.3

## 2012-06-04 MED ORDER — SODIUM CHLORIDE 0.9 % IJ SOLN
3.0000 mL | Freq: Two times a day (BID) | INTRAMUSCULAR | Status: DC
  Administered 2012-06-04 – 2012-06-08 (×5): 3 mL via INTRAVENOUS

## 2012-06-04 MED ORDER — VANCOMYCIN HCL 10 G IV SOLR
1500.0000 mg | Freq: Once | INTRAVENOUS | Status: AC
  Administered 2012-06-04: 1500 mg via INTRAVENOUS
  Filled 2012-06-04 (×2): qty 1500

## 2012-06-04 MED ORDER — VANCOMYCIN HCL IN DEXTROSE 1-5 GM/200ML-% IV SOLN
1000.0000 mg | INTRAVENOUS | Status: DC
  Filled 2012-06-04: qty 200

## 2012-06-04 MED ORDER — ALBUTEROL SULFATE (5 MG/ML) 0.5% IN NEBU
2.5000 mg | INHALATION_SOLUTION | Freq: Four times a day (QID) | RESPIRATORY_TRACT | Status: DC
  Administered 2012-06-04 – 2012-06-08 (×16): 2.5 mg via RESPIRATORY_TRACT
  Filled 2012-06-04 (×15): qty 0.5

## 2012-06-04 MED ORDER — ISOSORBIDE MONONITRATE ER 30 MG PO TB24
30.0000 mg | ORAL_TABLET | Freq: Every day | ORAL | Status: DC
Start: 2012-06-04 — End: 2012-06-12
  Administered 2012-06-04 – 2012-06-12 (×9): 30 mg via ORAL
  Filled 2012-06-04 (×9): qty 1

## 2012-06-04 MED ORDER — ACETAMINOPHEN 325 MG PO TABS
650.0000 mg | ORAL_TABLET | Freq: Four times a day (QID) | ORAL | Status: DC | PRN
  Administered 2012-06-04: 650 mg via ORAL
  Filled 2012-06-04: qty 2

## 2012-06-04 MED ORDER — FUROSEMIDE 10 MG/ML IJ SOLN
80.0000 mg | Freq: Once | INTRAMUSCULAR | Status: AC
  Administered 2012-06-04: 80 mg via INTRAVENOUS
  Filled 2012-06-04: qty 8

## 2012-06-04 MED ORDER — HYDRALAZINE HCL 50 MG PO TABS
100.0000 mg | ORAL_TABLET | Freq: Three times a day (TID) | ORAL | Status: DC
  Administered 2012-06-04 – 2012-06-12 (×24): 100 mg via ORAL
  Filled 2012-06-04 (×27): qty 2

## 2012-06-04 MED ORDER — ASPIRIN 81 MG PO CHEW
81.0000 mg | CHEWABLE_TABLET | Freq: Every day | ORAL | Status: DC
  Administered 2012-06-04 – 2012-06-12 (×9): 81 mg via ORAL
  Filled 2012-06-04 (×9): qty 1

## 2012-06-04 MED ORDER — BUSPIRONE HCL 5 MG PO TABS
5.0000 mg | ORAL_TABLET | Freq: Two times a day (BID) | ORAL | Status: DC
  Administered 2012-06-04 – 2012-06-12 (×16): 5 mg via ORAL
  Filled 2012-06-04 (×17): qty 1

## 2012-06-04 MED ORDER — PANTOPRAZOLE SODIUM 20 MG PO TBEC
20.0000 mg | DELAYED_RELEASE_TABLET | Freq: Every evening | ORAL | Status: DC
  Administered 2012-06-04 – 2012-06-11 (×8): 20 mg via ORAL
  Filled 2012-06-04 (×9): qty 1

## 2012-06-04 MED ORDER — SODIUM CHLORIDE 0.9 % IJ SOLN
3.0000 mL | Freq: Two times a day (BID) | INTRAMUSCULAR | Status: DC
  Administered 2012-06-05 – 2012-06-11 (×7): 3 mL via INTRAVENOUS

## 2012-06-04 MED ORDER — METOCLOPRAMIDE HCL 5 MG PO TABS
5.0000 mg | ORAL_TABLET | Freq: Four times a day (QID) | ORAL | Status: DC
  Administered 2012-06-04 – 2012-06-12 (×31): 5 mg via ORAL
  Filled 2012-06-04 (×36): qty 1

## 2012-06-04 MED ORDER — SODIUM CHLORIDE 0.9 % IJ SOLN: 3.0000 mL | INTRAMUSCULAR | Status: DC | PRN

## 2012-06-04 MED ORDER — ACETAMINOPHEN 650 MG RE SUPP: 650.0000 mg | Freq: Four times a day (QID) | RECTAL | Status: DC | PRN

## 2012-06-04 MED ORDER — SIMVASTATIN 10 MG PO TABS
10.0000 mg | ORAL_TABLET | Freq: Every day | ORAL | Status: DC
  Administered 2012-06-04 – 2012-06-11 (×8): 10 mg via ORAL
  Filled 2012-06-04 (×9): qty 1

## 2012-06-04 MED ORDER — CARVEDILOL 6.25 MG PO TABS
6.2500 mg | ORAL_TABLET | Freq: Two times a day (BID) | ORAL | Status: DC
  Administered 2012-06-04 – 2012-06-11 (×14): 6.25 mg via ORAL
  Filled 2012-06-04 (×16): qty 1

## 2012-06-04 MED ORDER — NITROGLYCERIN IN D5W 200-5 MCG/ML-% IV SOLN
10.0000 ug/min | INTRAVENOUS | Status: DC
  Filled 2012-06-04: qty 250

## 2012-06-04 MED ORDER — CLONIDINE HCL 0.2 MG PO TABS
0.2000 mg | ORAL_TABLET | Freq: Once | ORAL | Status: AC
  Administered 2012-06-04: 0.2 mg via ORAL
  Filled 2012-06-04: qty 1

## 2012-06-04 MED ORDER — FUROSEMIDE 10 MG/ML IJ SOLN
80.0000 mg | Freq: Three times a day (TID) | INTRAMUSCULAR | Status: DC
  Administered 2012-06-04 – 2012-06-06 (×6): 80 mg via INTRAVENOUS
  Filled 2012-06-04 (×9): qty 8

## 2012-06-04 MED ORDER — SODIUM CHLORIDE 0.9 % IV SOLN: 250.0000 mL | INTRAVENOUS | Status: DC | PRN

## 2012-06-04 MED ORDER — PIPERACILLIN-TAZOBACTAM 3.375 G IVPB
3.3750 g | Freq: Three times a day (TID) | INTRAVENOUS | Status: DC
  Administered 2012-06-04 – 2012-06-06 (×5): 3.375 g via INTRAVENOUS
  Filled 2012-06-04 (×7): qty 50

## 2012-06-04 NOTE — Procedures (Signed)
**Note De-Identified Sumeya Yontz Obfuscation** Tracheostomy Change Note  Patient Details:   Name: [name] DOB: 07/19/1940 MRN: [id number]    Airway Documentation:     Evaluation  O2 sats: stable throughout Complications: No apparent complications Patient did tolerate procedure well. Bilateral Breath Sounds: Diminished;Expiratory wheezes   Patient arrived with EMS from home with #6 CFS Shiley trach with disposable inner cannula.  Patient was in respiratory distress and was being bagged 100% O2 with mask.  Janina Mayo was unable to ventilate with bag lavage and suctioning.  Patient was placed on 4L nasal cannula SAT 98%.  RT removed trach to discover complete occlusion.  Trach replaced with #6 CFS Shiley with non-disposable inner cannula  And placed on 40% ATC.  Vadhir Mcnay, Megan Salon 06/04/2012, 10:03 AM

## 2012-06-04 NOTE — ED Notes (Signed)
Spoke with Dr. Blinda Leatherwood regarding pt's BP down to 146/76.  Asked if he still wanted Nitro gtt.  He stated that we can hold Nitro gtt for now.  Will continue to monitor BP.

## 2012-06-04 NOTE — ED Provider Notes (Signed)
History     CSN: [id number]  Arrival date & time 06/04/12  4098   First MD Initiated Contact with Patient 06/04/12 [phone]      Chief Complaint  Patient presents with  . Respiratory Distress    (Consider location/radiation/quality/duration/timing/severity/associated sxs/prior treatment) HPI Comments: Patient presents to the ER for evaluation of sudden shortness of breath. Patient comes from home. Patient denies any cough, fever. She has not been experiencing any chest pain. Patient is brought to the emergency department by EMS. Patient does have a previous tracheostomy. EMS report that they tried to back her via her trach, but were on April 2. There was significant resistance present. EMS has been assisting her breathing by bag-valve-mask. Patient's oxygen saturations have been around 90%.   Past Medical History  Diagnosis Date  . COPD (chronic obstructive pulmonary disease)     on home o2  . Chronic renal insufficiency   . CVA (cerebral infarction)     hx  . Hypertensive heart disease with congestive heart failure   . Chronic cor pulmonale   . Cardiomyopathy of undetermined type 09/07/2011  . Chronic kidney disease (CKD), stage IV (severe)   . Hyperlipdemia   . Obesity (BMI 30-39.9)   . Sleep apnea   . History of gout 09/07/2011  . Heart failure 7/13  . Shortness of breath   . Anemia   . Vascular disease   . Asthma   . Anginal pain   . Hypertension   . Type II or unspecified type diabetes mellitus without mention of complication, not stated as uncontrolled   . Stroke   . GERD (gastroesophageal reflux disease)   . Arthritis   . Bundle branch block   . Thrombocytopenia   . Acute and chronic respiratory failure     Past Surgical History  Procedure Laterality Date  . Bilateral oophorectomy    . Cardiac catheterization      right heart cath  . Laparoscopic gastrostomy  12/21/2011    Procedure: LAPAROSCOPIC GASTROSTOMY;  Surgeon: Axel Filler, MD;  Location: [facility] OR;   Service: General;  Laterality: N/A;  laparoscopic gastrostomy possible open feeding tube  . Abdominal hysterectomy    . Tracheostomy  11/2011  . Cataracts    . Tracheostomy tube placement  02/18/2012    Procedure: TRACHEOSTOMY;  Surgeon: Serena Colonel, MD;  Location: [facility] OR;  Service: ENT;  Laterality: N/A;    Family History  Problem Relation Age of Onset  . Heart attack Father   . Stroke Mother     CVA  . Coronary artery disease Daughter     History  Substance Use Topics  . Smoking status: Former Smoker -- 1.00 packs/day for 55 years    Types: Cigarettes    Quit date: 08/28/2011  . Smokeless tobacco: Never Used  . Alcohol Use: No    OB History   Grav Para Term Preterm Abortions TAB SAB Ect Mult Living                  Review of Systems  Respiratory: Positive for shortness of breath. Negative for cough.   Cardiovascular: Negative for chest pain.  All other systems reviewed and are negative.    Allergies  Sulfonamide derivatives  Home Medications   Current Outpatient Rx  Name  Route  Sig  Dispense  Refill  . amLODipine (NORVASC) 10 MG tablet   Oral   Take 10 mg by mouth daily.         Marland Kitchen  aspirin 81 MG chewable tablet   Oral   Chew 1 tablet (81 mg total) by mouth daily.         . busPIRone (BUSPAR) 5 MG tablet   Oral   Take 5 mg by mouth 2 (two) times daily.         . carvedilol (COREG) 6.25 MG tablet   Oral   Take 1 tablet (6.25 mg total) by mouth 2 (two) times daily with a meal.   60 tablet   1   . cloNIDine (CATAPRES) 0.1 MG tablet   Oral   Take 0.1 mg by mouth 2 (two) times daily.         . ferrous sulfate 325 (65 FE) MG EC tablet   Oral   Take 325 mg by mouth 2 (two) times daily.         . hydrALAZINE (APRESOLINE) 100 MG tablet   Oral   Take 100 mg by mouth 3 (three) times daily.         . isosorbide mononitrate (IMDUR) 30 MG 24 hr tablet   Oral   Take 1 tablet (30 mg total) by mouth daily.   30 tablet   1   . metoCLOPramide  (REGLAN) 5 MG tablet   Oral   Take 5 mg by mouth 4 (four) times daily.         Marland Kitchen oxycodone (OXY-IR) 5 MG capsule   Oral   Take 5 mg by mouth every 6 (six) hours as needed (for moderate pain).         . pantoprazole (PROTONIX) 20 MG tablet   Oral   Take 20 mg by mouth every evening.         . pravastatin (PRAVACHOL) 40 MG tablet   Oral   Take 40 mg by mouth daily.         . sennosides-docusate sodium (SENOKOT-S) 8.6-50 MG tablet   Oral   Take 1 tablet by mouth 2 (two) times daily as needed for constipation.         . sertraline (ZOLOFT) 100 MG tablet   Oral   Take 100 mg by mouth at bedtime.         . torsemide (DEMADEX) 20 MG tablet   Oral   Take 20 mg by mouth 2 (two) times daily.           BP 241/106  Pulse 98  Temp(Src) 98.6 F (37 C) (Oral)  Resp 22  SpO2 100%  Physical Exam  Constitutional: She is oriented to person, place, and time. She appears well-developed and well-nourished. She appears distressed.  HENT:  Head: Normocephalic and atraumatic.  Right Ear: Hearing normal.  Nose: Nose normal.  Mouth/Throat: Oropharynx is clear and moist and mucous membranes are normal.  Eyes: Conjunctivae and EOM are normal. Pupils are equal, round, and reactive to light.  Neck: Normal range of motion. Neck supple.  Cardiovascular: Normal rate, regular rhythm, S1 normal and S2 normal.  Exam reveals no gallop and no friction rub.   No murmur heard. Pulmonary/Chest: Effort normal. No respiratory distress. She has decreased breath sounds in the right lower field and the left lower field. She has wheezes in the right lower field and the left lower field. She has rhonchi in the right lower field and the left lower field. She has no rales. She exhibits no tenderness.  Abdominal: Soft. Normal appearance and bowel sounds are normal. There is no hepatosplenomegaly. There is no tenderness. There  is no rebound, no guarding, no tenderness at McBurney's point and negative  Murphy's sign. No hernia.  Musculoskeletal: Normal range of motion.  Neurological: She is alert and oriented to person, place, and time. She has normal strength. No cranial nerve deficit or sensory deficit. Coordination normal. GCS eye subscore is 4. GCS verbal subscore is 5. GCS motor subscore is 6.  Skin: Skin is warm, dry and intact. No rash noted. No cyanosis.  Psychiatric: She has a normal mood and affect. Her speech is normal and behavior is normal. Thought content normal.    ED Course  Procedures (including critical care time)  CRITICAL CARE Performed by: Gilda Crease.   Total critical care time: 30  Critical care time was exclusive of separately billable procedures and treating other patients.  Critical care was necessary to treat or prevent imminent or life-threatening deterioration.  Critical care was time spent personally by me on the following activities: development of treatment plan with patient and/or surrogate as well as nursing, discussions with consultants, evaluation of patient's response to treatment, examination of patient, obtaining history from patient or surrogate, ordering and performing treatments and interventions, ordering and review of laboratory studies, ordering and review of radiographic studies, pulse oximetry and re-evaluation of patient's condition.   EKG:  Date: 06/04/2012  Rate: 94  Rhythm: normal sinus rhythm  QRS Axis: left  Intervals: normal  ST/T Wave abnormalities: nonspecific ST/T changes  Conduction Disutrbances:right bundle branch block and LVH with reolarization abnormality  Narrative Interpretation:   Old EKG Reviewed: changes noted and normalization of inferior T waves    Labs Reviewed  CBC WITH DIFFERENTIAL - Abnormal; Notable for the following:    Hemoglobin 11.9 (*)    MCH 25.3 (*)    RDW 16.5 (*)    Platelets 139 (*)    All other components within normal limits  BASIC METABOLIC PANEL - Abnormal; Notable for the  following:    Potassium 5.8 (*)    BUN 27 (*)    Creatinine, Ser 2.00 (*)    GFR calc non Af Amer 24 (*)    GFR calc Af Amer 28 (*)    All other components within normal limits  PRO B NATRIURETIC PEPTIDE - Abnormal; Notable for the following:    Pro B Natriuretic peptide (BNP) 8639.0 (*)    All other components within normal limits  CULTURE, BLOOD (ROUTINE X 2)  CULTURE, BLOOD (ROUTINE X 2)  TROPONIN I   Dg Chest Port 1 View  06/04/2012  *RADIOLOGY REPORT*  Clinical Data: Shortness of breath.  PORTABLE CHEST - 1 VIEW 06/05/2011 1153 hours:  Comparison: Portable chest x-ray 05/18/2012, 05/15/2012 and two- view chest x-ray 04/28/1012.  Findings: Dense consolidation in the left lower lobe and lingula, progressive since the 05/18/2012 examination.  Right lung remains essentially clear.  Cardiac silhouette enlarged but stable.  The patient's chin obscures the left apex.  Tracheostomy tube tip below the thoracic inlet.  IMPRESSION: Worsening dense atelectasis and/or pneumonia in the left lower lobe and lingula since 03/ 27/2014.   Original Report Authenticated By: Hulan Saas, M.D.      Diagnosis: 1. Acute respiratory distress 2. Pneumonia 3. Hypertensive urgency 4. Congestive heart failure    MDM  Presented to the ER for evaluation of respiratory distress. Patient has a tracheostomy. EMS reported they had difficulty attempting to bag her through the tracheostomy. She was evaluated at arrival. Ambu bag was attached to her trach and there was significant resistance. Attempts  were made to suction out the trach. Internal cannula was removed and appeared clean. The entire trach was then removed and there was a significant, hard complete obstruction. Janina Mayo was changed out and the patient had some improvement with her difficulty breathing.  Patient was significantly hypertensive on arrival. Initially it was felt that this might be due to her respiratory distress. After she was breathing more  comfortably, however, blood pressure was only down from 240/110 to 190s/80s. It was noted that the patient is on clonidine and likely did not have her morning dose. She was given a dose of clonidine and had significant improvement in the blood pressure. Initially it was thought that nitroglycerin may be needed, but this was never initiated after the improvement with clonidine.  Patient's admission EKG did have some suggestion of elevation this, but it was felt that this was secondary to LVH. Patient was not experiencing any chest pain. When compared to previous EKG, some of the elevations were present previously. Inverted T waves seen previously are now upright. This can be a sign of ischemia/infarct, but againis experiencing any chest pain, it was felt that troponins should be evaluated. First troponin is negative. Natruretic peptide was significantly elevated. Chest x-ray shows probable pneumonia at the left base and lingula, but there is some evidence of increased vascular markings on the right side as well. Patient treated with Lasix and also initiated on Zosyn and vancomycin. Patient will require hospitalization for further treatment.      Gilda Crease, MD 06/04/12 [phone]

## 2012-06-04 NOTE — Progress Notes (Addendum)
ANTIBIOTIC CONSULT NOTE - INITIAL  Pharmacy Consult for vancomycin/ zosyn Indication: suspected pneumonia  Allergies  Allergen Reactions  . Sulfonamide Derivatives Hives and Rash    Patient Measurements: Height: 163 cm Weight: 79.3 kg  Vital Signs: Temp: 98.6 F (37 C) (04/13 0943) Temp src: Oral (04/13 0943) BP: 152/73 mmHg (04/13 1221) Pulse Rate: 73 (04/13 1221) Intake/Output from previous day:   Intake/Output from this shift:    Labs:  Recent Labs  06/04/12 0932  WBC 10.0  HGB 11.9*  PLT 139*  CREATININE 2.00*   CrCl ~31 ml/min  The CrCl is unknown because both a height and weight (above a minimum accepted value) are required for this calculation. No results found for this basename: VANCOTROUGH, Leodis Binet, VANCORANDOM, GENTTROUGH, GENTPEAK, GENTRANDOM, TOBRATROUGH, TOBRAPEAK, TOBRARND, AMIKACINPEAK, AMIKACINTROU, AMIKACIN,  in the last 72 hours   Microbiology: Recent Results (from the past 720 hour(s))  MRSA PCR SCREENING     Status: None   Collection Time    05/15/12 11:10 AM      Result Value Range Status   MRSA by PCR NEGATIVE  NEGATIVE Final   Comment:            The GeneXpert MRSA Assay (FDA     approved for NASAL specimens     only), is one component of a     comprehensive MRSA colonization     surveillance program. It is not     intended to diagnose MRSA     infection nor to guide or     monitor treatment for     MRSA infections.    Medical History: Past Medical History  Diagnosis Date  . COPD (chronic obstructive pulmonary disease)     on home o2  . Chronic renal insufficiency   . CVA (cerebral infarction)     hx  . Hypertensive heart disease with congestive heart failure   . Chronic cor pulmonale   . Cardiomyopathy of undetermined type 09/07/2011  . Chronic kidney disease (CKD), stage IV (severe)   . Hyperlipdemia   . Obesity (BMI 30-39.9)   . Sleep apnea   . History of gout 09/07/2011  . Heart failure 7/13  . Shortness of breath    . Anemia   . Vascular disease   . Asthma   . Anginal pain   . Hypertension   . Type II or unspecified type diabetes mellitus without mention of complication, not stated as uncontrolled   . Stroke   . GERD (gastroesophageal reflux disease)   . Arthritis   . Bundle branch block   . Thrombocytopenia   . Acute and chronic respiratory failure     Medications:  See PTA medication list  Assessment: 72 y/o female with several admissions in the last 6 months, most recently from 05/15/12-05/19/12 for respiratory distress. She has a trach in place which was found to be blocked today. Pharmacy consulted to begin vancomycin empirically for suspected PNA. Patient also to receive Zosyn. CXR with worsening dense atelectasis and/or pneumonia in the left lower lobe. She is afebrile and WBC are wnl.  Goal of Therapy:  Vancomycin trough level 15-20 mcg/ml  Plan:  -Vancomycin 1500 mg IV now then 1000 mg IV q24h -Monitor renal function and clinical progress -Zosyn 3.375g IV q hrs.  Tad Moore, BCPS  Clinical Pharmacist Pager [phone]  06/04/2012 3:03 PM

## 2012-06-04 NOTE — H&P (Signed)
[facility] History and Physical  [name] ZOX:[id number] DOB: 01/10/1941 DOA: 06/04/2012  Referring physician: Dr Oletta Cohn PCP: Felicia Aliment, MD    Chief Complaint: shortness of breath    HPI: [name] is a 72 y.o. female witH h/o cardio pulmonary arrest in 01/2012 , s/p trach,  s/p recent admission for acute on chronic respirator failure and discharged on 3/28 was brought in today for respiratory distress by EMS. On arrival to the ED a large mucous plug was suctioned from her trach and she seems more comfortable. 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 Patient cannot talk at the moment, no family is in the room and no family is answering my calls. These are the contacts and numbers I have tried calling  [name] Daughter [phone] [name] Daughter [phone]   Review of Systems: unobtainable . Patient shakes her head as NO when asked about pain    Past Medical History  Diagnosis Date  . COPD (chronic obstructive pulmonary disease)     on home o2  . Chronic renal insufficiency   . CVA (cerebral infarction)     hx  . Hypertensive heart disease with congestive heart failure   . Chronic cor pulmonale   . Cardiomyopathy of undetermined type 09/07/2011  . Chronic kidney disease (CKD), stage IV (severe)   . Hyperlipdemia   . Obesity (BMI 30-39.9)   . Sleep apnea   . History of gout 09/07/2011  . Heart failure 7/13  . Shortness of breath   . Anemia   . Vascular disease   . Asthma   . Anginal pain   . Hypertension   . Type II or unspecified type diabetes mellitus without mention of complication, not stated as uncontrolled   . Stroke   . GERD (gastroesophageal reflux disease)   . Arthritis   . Bundle branch block   . Thrombocytopenia   . Acute and chronic respiratory failure    Past Surgical History  Procedure Laterality Date  . Bilateral oophorectomy    . Cardiac  catheterization      right heart cath  . Laparoscopic gastrostomy  12/21/2011    Procedure: LAPAROSCOPIC GASTROSTOMY;  Surgeon: Axel Filler, MD;  Location: [facility] OR;  Service: General;  Laterality: N/A;  laparoscopic gastrostomy possible open feeding tube  . Abdominal hysterectomy    . Tracheostomy  11/2011  . Cataracts    . Tracheostomy tube placement  02/18/2012    Procedure: TRACHEOSTOMY;  Surgeon: Serena Colonel, MD;  Location: [name] OR;  Service: ENT;  Laterality: N/A;   Social History: she has quit smoking about 9 months ago. Her smoking use included Cigarettes. She has a 55 pack-year smoking history.  she does not drink alcohol or use illicit drugs. The patient lives at her home    Allergies  Allergen Reactions  . Sulfonamide Derivatives Hives and Rash    Family History  Problem Relation Age of Onset  . Heart attack Father   . Stroke Mother     CVA  . Coronary artery disease Daughter    Prior to Admission medications   Medication Sig Start Date End Date Taking? Authorizing Provider  amLODipine (NORVASC) 10 MG tablet Take 10 mg by mouth daily.   Yes Historical Provider, MD  aspirin 81 MG chewable tablet Chew 1 tablet (81 mg total) by mouth daily. 02/19/12  Yes Simonne Martinet, NP  busPIRone (BUSPAR) 5 MG tablet Take 5  mg by mouth 2 (two) times daily.   Yes Historical Provider, MD  carvedilol (COREG) 3.125 MG tablet Take 1 tablet (3.125 mg total) by mouth 2 (two) times daily with a meal. 02/19/12  Yes Simonne Martinet, NP  chlorhexidine (PERIDEX) 0.12 % solution Use as directed 15 mLs in the mouth or throat 2 (two) times daily. 02/19/12  Yes Simonne Martinet, NP  cloNIDine (CATAPRES) 0.1 MG tablet Take 0.1 mg by mouth 2 (two) times daily.   Yes Historical Provider, MD  ferrous sulfate 325 (65 FE) MG EC tablet Take 325 mg by mouth 2 (two) times daily.   Yes Historical Provider, MD  hydrALAZINE (APRESOLINE) 100 MG tablet Take 100 mg by mouth 3 (three) times daily.   Yes Historical  Provider, MD  metoCLOPramide (REGLAN) 5 MG tablet Take 5 mg by mouth 4 (four) times daily.   Yes Historical Provider, MD  oxycodone (OXY-IR) 5 MG capsule Take 5 mg by mouth every 6 (six) hours as needed (for moderate pain).   Yes Historical Provider, MD  pantoprazole (PROTONIX) 20 MG tablet Take 20 mg by mouth every evening.   Yes Historical Provider, MD  pravastatin (PRAVACHOL) 40 MG tablet Take 40 mg by mouth daily.   Yes Historical Provider, MD  sennosides-docusate sodium (SENOKOT-S) 8.6-50 MG tablet Take 1 tablet by mouth 2 (two) times daily as needed for constipation.   Yes Historical Provider, MD  sertraline (ZOLOFT) 100 MG tablet Take 100 mg by mouth at bedtime.   Yes Historical Provider, MD  torsemide (DEMADEX) 20 MG tablet Take 20 mg by mouth 2 (two) times daily.   Yes Historical Provider, MD   Physical Exam: Filed Vitals:   06/04/12 1130 06/04/12 1159 06/04/12 1200 06/04/12 1221  BP: 183/77  158/69 152/73  Pulse: 80 74 73 73  Temp:      TempSrc:      Resp: 21 20 18 20   SpO2: 99% 98% 98% 98%    Constitutional: Vital signs reviewed.  Patient is a well-developed and well-nourished  in no acute distress and cooperative with exam. Head: Normocephalic and atraumatic Mouth: no erythema or exudates, MMM, tongue with white film  Eyes: PERRL, EOMI, conjunctivae normal, No scleral icterus.  Neck: s/p trach  No mass, thyromegaly, or carotid bruit present.  Cardiovascular: RRR, S1 normal, S2 normal, 2/6 systolic mumrmur at apx   Pulmonary/Chest: CTAB, scattered rhonchi. No wheezing Abdominal: Soft. Non-tender, non-distended, bowel sounds are normal, no masses, organomegaly, or guarding present.  Musculoskeletal: No joint deformities, erythema, or stiffness, ROM full and no nontender Neurological:  Strength is 4/5 symmetric bilaterally, cranial nerve II-XII are grossly intact, no focal motor deficit, sensory intact to light touch bilaterally.  Skin: Warm, dry and intact. No rash, cyanosis,  or clubbing.  LE +3 bilat edema   Labs on Admission:  Basic Metabolic Panel:  Recent Labs Lab 06/04/12 0932  NA 142  K 5.8*  CL 105  CO2 26  GLUCOSE 96  BUN 27*  CREATININE 2.00*  CALCIUM 9.8   Liver Function Tests: No results found for this basename: AST, ALT, ALKPHOS, BILITOT, PROT, ALBUMIN,  in the last 168 hours No results found for this basename: LIPASE, AMYLASE,  in the last 168 hours No results found for this basename: AMMONIA,  in the last 168 hours CBC:  Recent Labs Lab 06/04/12 0932  WBC 10.0  NEUTROABS 7.1  HGB 11.9*  HCT 39.4  MCV 83.7  PLT 139*   Cardiac Enzymes:  Recent Labs Lab 06/04/12 0932  TROPONINI <0.30    BNP (last 3 results)  Recent Labs  04/28/12 1611 05/15/12 0741 06/04/12 0932  PROBNP 3133.0* 1451.0* 8639.0*   CBG: No results found for this basename: GLUCAP,  in the last 168 hours  Radiological Exams on Admission: Dg Chest Port 1 View  06/04/2012  *RADIOLOGY REPORT*  Clinical Data: Shortness of breath.  PORTABLE CHEST - 1 VIEW 06/05/2011 1153 hours:  Comparison: Portable chest x-ray 05/18/2012, 05/15/2012 and two- view chest x-ray 04/28/1012.  Findings: Dense consolidation in the left lower lobe and lingula, progressive since the 05/18/2012 examination.  Right lung remains essentially clear.  Cardiac silhouette enlarged but stable.  The patient's chin obscures the left apex.  Tracheostomy tube tip below the thoracic inlet.  IMPRESSION: Worsening dense atelectasis and/or pneumonia in the left lower lobe and lingula since 03/ 27/2014.   Original Report Authenticated By: Hulan Saas, M.D.     EKG: t wave inversions, IN II, III avf, lead v2 and v3.   Assessment/Plan Active Problems: 1. Acute on chronic respiratory failure due to mucous plugging , HCAP, acute on chronic diastolic CHF, cor pulmonale : being admitted to stepdown in view of her trach collar and need for intermittent suctioning.  - tele monitor. Plan for empiric  broad spectrum abx, sputum culture,   2. Chronic systolic and diastolic heart failure: appears to be decompensated. BNP up, LE with marked edema, crakles in the lungs - plan for lasix iv 80 mg every 8 hours   3. CKD stage 4: baseline creat is 2.6 i wonder if the patient was compliant with diuretics at home    4. Anemia of chronic disease. Stable.       Code Status:full code Family Communication: none at bedside Disposition Plan: home      Kesha Hurrell [facility] Pager [phone]  If 7PM-7AM, please contact night-coverage [email or web address] Password [email or web address] 06/04/2012, 1:16 PM

## 2012-06-04 NOTE — ED Notes (Signed)
Pt from home.  Per EMS pt reported sudden onset SOB.  EMS attempted bagging trach, but unable.  BVM via face administered by EMS.  Pt is heart failure pt, takes lasix, and reports increased swelling.  RRT at bedside, trach occluded, RRT attempting to clear out.  EMS reports wheezing upper lobes and diminished lower lobes.  Albuterol treatment given with little to no improvement.

## 2012-06-05 ENCOUNTER — Telehealth: Payer: Self-pay | Admitting: *Deleted

## 2012-06-05 DIAGNOSIS — J449 Chronic obstructive pulmonary disease, unspecified: Secondary | ICD-10-CM

## 2012-06-05 DIAGNOSIS — N184 Chronic kidney disease, stage 4 (severe): Secondary | ICD-10-CM

## 2012-06-05 LAB — CBC
Hemoglobin: 9.1 g/dL — ABNORMAL LOW (ref 12.0–15.0)
MCH: 25 pg — ABNORMAL LOW (ref 26.0–34.0)
Platelets: 113 10*3/uL — ABNORMAL LOW (ref 150–400)
RBC: 3.64 MIL/uL — ABNORMAL LOW (ref 3.87–5.11)
WBC: 7.5 10*3/uL (ref 4.0–10.5)

## 2012-06-05 LAB — BASIC METABOLIC PANEL
CO2: 30 mEq/L (ref 19–32)
GFR calc non Af Amer: 18 mL/min — ABNORMAL LOW (ref >90)
Glucose, Bld: 158 mg/dL — ABNORMAL HIGH (ref 70–99)
Potassium: 3.5 mEq/L (ref 3.5–5.1)
Sodium: 142 mEq/L (ref 135–145)

## 2012-06-05 MED ORDER — VANCOMYCIN HCL IN DEXTROSE 1-5 GM/200ML-% IV SOLN
1000.0000 mg | INTRAVENOUS | Status: DC
Start: 2012-06-06 — End: 2012-06-07
  Administered 2012-06-06: 1000 mg via INTRAVENOUS
  Filled 2012-06-05: qty 200

## 2012-06-05 NOTE — Progress Notes (Signed)
Utilization review completed.  

## 2012-06-05 NOTE — Progress Notes (Signed)
PT Cancellation Note  Patient Details Name: [name] MRN: [id number] DOB: 1940/11/22   Cancelled Treatment:    Reason Eval Not Completed: Pt refusing. Shakes her head "yes" when asked if it's because of her breathing. Spent 5 minutes discussing benefits of activity and need to determine if she is moving well enough to return home on d/c. Pt nodded understanding and refused even bed level activities. RN made aware. Pt nodded "yes" when asked if PT could come tomorrow and assist her to get up.   Deriyah Kunath Pager [phone]  06/05/2012, 1:31 PM

## 2012-06-05 NOTE — Progress Notes (Signed)
[facility] PROGRESS NOTE  [name]:[id number] DOB: 1940-08-16 DOA: 06/04/2012 PCP: Gwynneth Aliment, MD  Assessment/Plan: 1. Acute on chronic respiratory failure due to mucous plugging , HCAP, acute on chronic diastolic CHF, cor pulmonale :  admitted to stepdown in view of her trach collar and need for intermittent suctioning.  - did well overnight on the first 24 hrs after admission.  - placed on  empiric broad spectrum abx, sputum culture sent   2. Acute on Chronic systolic and diastolic heart failure: appears to be decompensated. BNP up, LE with marked edema, crakles in the lungs - started lasix iv 80 mg every 8 hours f/u bmet  3. CKD stage 4: baseline creat is 2.6 i wonder if the patient was compliant with diuretics at home because on admission creatinine was down to 2 and patient had severe edema.   4. Anemia of chronic disease. Stable. No signs of bleeding     Code Status: full  Family Communication: no family present  Disposition Plan: home     Antibiotics: Vanc and Zosyn 4/13    HPI/Subjective: Looks comfortable   Objective: Filed Vitals:   06/04/12 2341 06/05/12 0031 06/05/12 0259 06/05/12 0300  BP: 115/57 121/45  121/45  Pulse: 75 71  71  Temp:  98.7 F (37.1 C)  99 F (37.2 C)  TempSrc:  Oral    Resp: 22 19  14   Height:      Weight:      SpO2: 96% 95% 95% 95%   Patient Vitals for the past 24 hrs:  BP Temp Temp src Pulse Resp SpO2 Height Weight  06/05/12 0300 121/45 mmHg 99 F (37.2 C) - 71 14 95 % - -  06/05/12 0259 - - - - - 95 % - -  06/05/12 0031 121/45 mmHg 98.7 F (37.1 C) Oral 71 19 95 % - -  06/04/12 2341 115/57 mmHg - - 75 22 96 % - -  06/04/12 2020 115/57 mmHg 98.8 F (37.1 C) Oral 77 20 96 % - -  06/04/12 1707 - - - - - 93 % - -  06/04/12 1605 - - - - - 100 % - -  06/04/12 1500 - - - - - - 5\' 4"  (1.626 m) 76.4 kg (168 lb 6.9 oz)  06/04/12 1430 - 98.7 F (37.1 C) Oral - - 100 % 5\' 4"  (1.626 m) 76.4 kg (168 lb 6.9 oz)   06/04/12 1300 178/83 mmHg - - 79 23 100 % - -  06/04/12 1221 152/73 mmHg - - 73 20 98 % - -  06/04/12 1200 158/69 mmHg - - 73 18 98 % - -  06/04/12 1159 - - - 74 20 98 % - -  06/04/12 1130 183/77 mmHg - - 80 21 99 % - -  06/04/12 1100 180/84 mmHg - - 78 25 99 % - -  06/04/12 1030 196/90 mmHg - - 93 17 97 % - -  06/04/12 1000 194/82 mmHg - - 89 21 99 % - -  06/04/12 0950 - - - 102 32 98 % - -  06/04/12 0943 241/106 mmHg 98.6 F (37 C) Oral 98 22 100 % - -  06/04/12 0933 - - - - - 90 % - -     Intake/Output Summary (Last 24 hours) at 06/05/12 0740 Last data filed at 06/05/12 0400  Gross per 24 hour  Intake    240 ml  Output    875 ml  Net   -635 ml   Filed Weights   06/04/12 1430 06/04/12 1500  Weight: 76.4 kg (168 lb 6.9 oz) 76.4 kg (168 lb 6.9 oz)    Exam:   General:  alert  Cardiovascular: rrr  Respiratory: some rhonchi, no wheezes   Abdomen: soft, nt   Musculoskeletal: marked edema    Data Reviewed: Basic Metabolic Panel:  Recent Labs Lab 06/04/12 0932  NA 142  K 5.8*  CL 105  CO2 26  GLUCOSE 96  BUN 27*  CREATININE 2.00*  CALCIUM 9.8   Liver Function Tests: No results found for this basename: AST, ALT, ALKPHOS, BILITOT, PROT, ALBUMIN,  in the last 168 hours No results found for this basename: LIPASE, AMYLASE,  in the last 168 hours No results found for this basename: AMMONIA,  in the last 168 hours CBC:  Recent Labs Lab 06/04/12 0932 06/05/12 0655  WBC 10.0 7.5  NEUTROABS 7.1  --   HGB 11.9* 9.1*  HCT 39.4 29.6*  MCV 83.7 81.3  PLT 139* PENDING   Cardiac Enzymes:  Recent Labs Lab 06/04/12 0932  TROPONINI <0.30   BNP (last 3 results)  Recent Labs  04/28/12 1611 05/15/12 0741 06/04/12 0932  PROBNP 3133.0* 1451.0* 8639.0*   CBG:  Recent Labs Lab 06/04/12 1616 06/04/12 2148  GLUCAP 91 164*    Recent Results (from the past 240 hour(s))  MRSA PCR SCREENING     Status: None   Collection Time    06/04/12  3:35 PM       Result Value Range Status   MRSA by PCR NEGATIVE  NEGATIVE Final   Comment:            The GeneXpert MRSA Assay (FDA     approved for NASAL specimens     only), is one component of a     comprehensive MRSA colonization     surveillance program. It is not     intended to diagnose MRSA     infection nor to guide or     monitor treatment for     MRSA infections.     Studies: Dg Chest Port 1 View  06/04/2012  *RADIOLOGY REPORT*  Clinical Data: Shortness of breath.  PORTABLE CHEST - 1 VIEW 06/05/2011 1153 hours:  Comparison: Portable chest x-ray 05/18/2012, 05/15/2012 and two- view chest x-ray 04/28/1012.  Findings: Dense consolidation in the left lower lobe and lingula, progressive since the 05/18/2012 examination.  Right lung remains essentially clear.  Cardiac silhouette enlarged but stable.  The patient's chin obscures the left apex.  Tracheostomy tube tip below the thoracic inlet.  IMPRESSION: Worsening dense atelectasis and/or pneumonia in the left lower lobe and lingula since 03/ 27/2014.   Original Report Authenticated By: Hulan Saas, M.D.     Scheduled Meds: . albuterol  2.5 mg Nebulization Q6H  . amLODipine  10 mg Oral Daily  . aspirin  81 mg Oral Daily  . busPIRone  5 mg Oral BID  . carvedilol  6.25 mg Oral BID WC  . enoxaparin (LOVENOX) injection  30 mg Subcutaneous Q24H  . furosemide  80 mg Intravenous Q8H  . hydrALAZINE  100 mg Oral TID  . isosorbide mononitrate  30 mg Oral Daily  . metoCLOPramide  5 mg Oral QID  . pantoprazole  20 mg Oral QPM  . piperacillin-tazobactam (ZOSYN)  IV  3.375 g Intravenous Q8H  . sertraline  100 mg Oral QHS  . simvastatin  10 mg Oral q1800  .  sodium chloride  3 mL Intravenous Q12H  . sodium chloride  3 mL Intravenous Q12H  . vancomycin  1,000 mg Intravenous Q24H   Continuous Infusions:   Principal Problem:   Acute-on-chronic respiratory failure Active Problems:   Type 2 diabetes mellitus with vascular disease   Hyperlipdemia    Obesity (BMI 30-39.9)   Sleep apnea   Hypertensive heart disease with congestive heart failure   CORONARY ARTERY DISEASE   Chronic cor pulmonale   C O P D   Chronic kidney disease (CKD), stage IV (severe)   Bifascicular block   Anemia   Acute on chronic systolic heart failure   Cirrhosis of liver   Tracheostomy complication   Cardiac arrest   HCAP (healthcare-associated pneumonia)   Tracheostomy status      Maelee Hoot  [facility] Pager [phone]. If 7PM-7AM, please contact night-coverage at [email or web address], password [email or web address] 06/05/2012, 7:40 AM  LOS: 1 day

## 2012-06-05 NOTE — Evaluation (Signed)
Passy-Muir Speaking Valve - Evaluation Patient Details  Name: [name] MRN: [id number] Date of Birth: 08/01/1940  Today's Date: 06/05/2012 Time: 4098-1191 SLP Time Calculation (min): 31 min  Past Medical History:  Past Medical History  Diagnosis Date  . COPD (chronic obstructive pulmonary disease)     on home o2  . Chronic renal insufficiency   . CVA (cerebral infarction)     hx  . Hypertensive heart disease with congestive heart failure   . Chronic cor pulmonale   . Cardiomyopathy of undetermined type 09/07/2011  . Chronic kidney disease (CKD), stage IV (severe)   . Hyperlipdemia   . Obesity (BMI 30-39.9)   . Sleep apnea   . History of gout 09/07/2011  . Heart failure 7/13  . Shortness of breath   . Anemia   . Vascular disease   . Asthma   . Anginal pain   . Hypertension   . Type II or unspecified type diabetes mellitus without mention of complication, not stated as uncontrolled   . Stroke   . GERD (gastroesophageal reflux disease)   . Arthritis   . Bundle branch block   . Thrombocytopenia   . Acute and chronic respiratory failure    Past Surgical History:  Past Surgical History  Procedure Laterality Date  . Bilateral oophorectomy    . Cardiac catheterization      right heart cath  . Laparoscopic gastrostomy  12/21/2011    Procedure: LAPAROSCOPIC GASTROSTOMY;  Surgeon: Axel Filler, MD;  Location: [facility] OR;  Service: General;  Laterality: N/A;  laparoscopic gastrostomy possible open feeding tube  . Abdominal hysterectomy    . Tracheostomy  11/2011  . Cataracts    . Tracheostomy tube placement  02/18/2012    Procedure: TRACHEOSTOMY;  Surgeon: Serena Colonel, MD;  Location: [facility] OR;  Service: ENT;  Laterality: N/A;   HPI:  Pt admitted to [facility] 11/14/11 with OHS/OSA, diastolic and systolic CHF, COPD, DM, HTN, hypercarbic respiratory failure with intubation 9/22-9/26, re-intubation 9/30, and trach 10/1. G-tube placed 10/29, Passy-Muir valve eval 10/2; D/C'd to  [facility] 10/3 with subsequent D/C to [facility]. Pt has been NPO for this duration. Readmitted 12/27/11 for cellulitis around G-Tube. MBS 11/19 recommended Dys 3/thin liquids. Pt now admitted due to mucous plug. Pt reports she las lost her PMSV at home. Her primary caregiver, her daughter, is also currently admitted to [facility].    Assessment / Plan / Recommendation Clinical Impression  Pt initially demonstrated difficulty with valve placement due to immediate sensation of secretions in her airway. After 2-3 minutes of periodic hard coughing with valve in place, pt cleared her airway sufficiently to phonate, participate in brief conversation. There was no evidence of CO2 trapping, RR did periodically increase and pt reported mild discomfort. Suspect pt had more tracheal secretions. Recommend pt wear PMSV with full staff supervision. She should wear PMSV duing meals as well. Precuations posted and discussed with RN. WIll f/u for tolerance and education regarding use and self care.     SLP Assessment  Patient needs continued Speech Lanaguage Pathology Services    Follow Up Recommendations  None    Frequency and Duration min 2x/week  2 weeks   Pertinent Vitals/Pain NA    SLP Goals Potential to Achieve Goals: Good SLP Goal #1: Pt will wear PMSV during all waking hours with supervsion cues without change in vital signs or discomfort.  SLP Goal #2: Pt will place and remove valve with supervision cues x3 in a  session.  SLP Goal #3: Pt will verbalize understanding of PMSV precuations iwth min question cues x3.    PMSV Trial  PMSV was placed for: 15 minutes Able to redirect subglottic air through upper airway: Yes Able to Attain Phonation: Yes Voice Quality: Hoarse;Low vocal intensity Able to Expectorate Secretions: Yes Level of Secretion Expectoration with PMSV: Oral Breath Support for Phonation: Mildly decreased Intelligibility: Intelligible Respirations During Trial: 30 SpO2 During Trial:  97 % Behavior: Alert   Tracheostomy Tube  Additional Tracheostomy Tube Assessment Trach Collar Period: 24 hrs a day Secretion Description: not visualized Frequency of Tracheal Suctioning: frequent per RN Level of Secretion Expectoration: Tracheal    Vent Dependency  FiO2 (%): 28 %    Cuff Deflation Trial Tolerated Cuff Deflation:  (baseline, cuffless)   Detrice Cales, Riley Nearing 06/05/2012, 4:21 PM

## 2012-06-05 NOTE — Progress Notes (Signed)
Clinical Child psychotherapist (CSW) attempted to assess pt for SNF placement however pt nodded "no" when CSW asked if CSW could ask pt questions. Pt nodded "yes" when CSW explored whether pt would like for CSW to return. Pt dosed back to sleep.  Full assessment to follow.  Theresia Bough, MSW, Theresia Majors [phone]

## 2012-06-05 NOTE — Progress Notes (Signed)
Pharmacy: Vancomycin and Zosyn  71yof started on antibiotics 4/13 for HCAP. She has a history of CKD stage IV, but sCr has jumped from 2 to 2.55 today, likely due to IV diuresis. Also minimal urine output. Will need to adjust vancomycin dose. Zosyn dose borderline for adjustment - if CrCl <20 tomorrow will need to change dose.  4/13 Vancomycin>> 4/13 Zosyn>> 4/13 blood>> 4/13 respiratory>>  Plan: 1) Change vancomycin to 1000mg  IV q48 2) Continue zosyn 3.375g IV q8 (4 hour infusion) 3) Watch renal function   Louie Casa, PharmD, BCPS 06/05/12, 11:28 AM

## 2012-06-05 NOTE — Clinical Social Work Psychosocial (Signed)
     Clinical Social Work Department BRIEF PSYCHOSOCIAL ASSESSMENT 06/05/2012  Patient:  [name], [name]     Account Number:  [id number]     Admit date:  06/04/2012  Clinical Social Worker:  Lourdes Sledge  Date/Time:  06/05/2012 03:07 PM  Referred by:  CSW  Date Referred:  06/05/2012 Referred for  SNF Placement   Other Referral:   Interview type:  Patient Other interview type:   CSW completed assessment with pt daughter [name] [phone]    PSYCHOSOCIAL DATA Living Status:  WITH ADULT CHILDREN Admitted from facility:   Level of care:   Primary support name:  [name] [phone] Primary support relationship to patient:  CHILD, ADULT Degree of support available:   Pt daughter states she is pt primary support.    CURRENT CONCERNS Current Concerns  Post-Acute Placement   Other Concerns:    SOCIAL WORK ASSESSMENT / PLAN CSW informed that pt main caregiver is also in the hospital and may not be able to provide support for pt at discharge.    CSW visited pt room and explored whether CSW could speak with pt. Pt currently has a trach and informed CSW to contact her daughter [name] [phone] who is currently in the hospital.    CSW contacted daughter, introduced herself and role. CSW explored amount of support pt will have at discharge and the possibility that PT may recommend SNF at discharge. Daughter stated that she would not be agreeable to SNF placement for pt and that she would be able to provide support for pt at discharge. CSW informed daughter of concerns as pt will need significant support however daughter proceeded to  state that she would not be agreeable to placement for pt. Daughter requested to end conversation with CSW.    CSW notified RNCM.   Assessment/plan status:   Other assessment/ plan:   Information/referral to community resources:   No resources given at this time as pt daughter is not interested. Pt to [facility] with [facility].    PATIENTS/FAMILYS RESPONSE TO PLAN OF CARE: Pt laying in bed alert and appeared oriented. Pt requested for CSW to speak to her daughter [name] [phone] who provides support for pt. Pt daughter is not agreeable to SNF for pt at discharge.    CSW signing off as pt will return home with [facility].

## 2012-06-05 NOTE — Telephone Encounter (Signed)
error 

## 2012-06-06 LAB — BASIC METABOLIC PANEL
BUN: 37 mg/dL — ABNORMAL HIGH (ref 6–23)
CO2: 30 mEq/L (ref 19–32)
Calcium: 9.2 mg/dL (ref 8.4–10.5)
Creatinine, Ser: 2.68 mg/dL — ABNORMAL HIGH (ref 0.50–1.10)
GFR calc non Af Amer: 17 mL/min — ABNORMAL LOW (ref >90)
Glucose, Bld: 94 mg/dL (ref 70–99)
Sodium: 143 mEq/L (ref 135–145)

## 2012-06-06 LAB — CULTURE, RESPIRATORY W GRAM STAIN

## 2012-06-06 MED ORDER — POTASSIUM CHLORIDE CRYS ER 20 MEQ PO TBCR
40.0000 meq | EXTENDED_RELEASE_TABLET | Freq: Every day | ORAL | Status: DC
  Administered 2012-06-06 – 2012-06-12 (×7): 40 meq via ORAL
  Filled 2012-06-06: qty 2
  Filled 2012-06-06: qty 1
  Filled 2012-06-06 (×6): qty 2

## 2012-06-06 MED ORDER — PIPERACILLIN-TAZOBACTAM IN DEX 2-0.25 GM/50ML IV SOLN
2.2500 g | Freq: Four times a day (QID) | INTRAVENOUS | Status: DC
  Administered 2012-06-06 – 2012-06-07 (×4): 2.25 g via INTRAVENOUS
  Filled 2012-06-06 (×7): qty 50

## 2012-06-06 MED ORDER — FUROSEMIDE 10 MG/ML IJ SOLN
80.0000 mg | Freq: Two times a day (BID) | INTRAMUSCULAR | Status: DC
  Administered 2012-06-06: 80 mg via INTRAVENOUS
  Filled 2012-06-06 (×2): qty 8

## 2012-06-06 NOTE — Progress Notes (Signed)
Passy-Muir Speaking Valve - Treatment Patient Details  Name: [name] MRN: [id number] Date of Birth: 10/22/1940  Today's Date: 06/06/2012 Time: 0826-0850 SLP Time Calculation (min): 24 min  Past Medical History:  Past Medical History  Diagnosis Date  . COPD (chronic obstructive pulmonary disease)     on home o2  . Chronic renal insufficiency   . CVA (cerebral infarction)     hx  . Hypertensive heart disease with congestive heart failure   . Chronic cor pulmonale   . Cardiomyopathy of undetermined type 09/07/2011  . Chronic kidney disease (CKD), stage IV (severe)   . Hyperlipdemia   . Obesity (BMI 30-39.9)   . Sleep apnea   . History of gout 09/07/2011  . Heart failure 7/13  . Shortness of breath   . Anemia   . Vascular disease   . Asthma   . Anginal pain   . Hypertension   . Type II or unspecified type diabetes mellitus without mention of complication, not stated as uncontrolled   . Stroke   . GERD (gastroesophageal reflux disease)   . Arthritis   . Bundle branch block   . Thrombocytopenia   . Acute and chronic respiratory failure    Past Surgical History:  Past Surgical History  Procedure Laterality Date  . Bilateral oophorectomy    . Cardiac catheterization      right heart cath  . Laparoscopic gastrostomy  12/21/2011    Procedure: LAPAROSCOPIC GASTROSTOMY;  Surgeon: Axel Filler, MD;  Location: [facility] OR;  Service: General;  Laterality: N/A;  laparoscopic gastrostomy possible open feeding tube  . Abdominal hysterectomy    . Tracheostomy  11/2011  . Cataracts    . Tracheostomy tube placement  02/18/2012    Procedure: TRACHEOSTOMY;  Surgeon: Serena Colonel, MD;  Location[facility] OR;  Service: ENT;  Laterality: N/A;    Assessment / Plan / Recommendation Clinical Impression  Pt demonstrated improved tolerance of PMSV today. Pt demonstrated placement and removal x3, verbalized precuations to remove vavle for sleep with min question cue. No change in vital signs.  Secretions subjectively improved today, pt expectorating orally without cues with PMSV in place. RN reports pt tolerated vavle well yesterday, but becamse fatigued in pm after calling her dtr. Recommend pt attempt wearing PMSV all waking hours with intermittent supervision. Pt and RN agree, will monitor for fatigue.     Plan  Continue with current plan of care    Follow Up Recommendations       Pertinent Vitals/Pain NA    SLP Goals SLP Goal #1: Pt will wear PMSV during all waking hours with supervsion cues without change in vital signs or discomfort.  SLP Goal #1 - Progress: Progressing toward goal SLP Goal #2: Pt will place and remove valve with supervision cues x3 in a session.  SLP Goal #2 - Progress: Met SLP Goal #3: Pt will verbalize understanding of PMSV precuations iwth min question cues x3.  SLP Goal #3 - Progress: Progressing toward goal   PMSV Trial  PMSV was placed for: 20 minutes Able to redirect subglottic air through upper airway: Yes Able to Attain Phonation: Yes Voice Quality: Normal Able to Expectorate Secretions: Yes Level of Secretion Expectoration with PMSV: Oral Breath Support for Phonation: Mildly decreased Intelligibility: Intelligible Respirations During Trial: 25 SpO2 During Trial: 97 %   Tracheostomy Tube       Vent Dependency  FiO2 (%): 28 %    Cuff Deflation Trial  GO  Harlon Ditty, MA CCC-SLP [phone]      [name] 06/06/2012, 9:03 AM

## 2012-06-06 NOTE — Progress Notes (Signed)
[facility] PROGRESS NOTE  [name] ZOX:[id number] DOB: 11-Feb-1941 DOA: 06/04/2012 PCP: Gwynneth Aliment, MD  Assessment/Plan: 1. Acute on chronic respiratory failure due to mucous plugging , HCAP, acute on chronic diastolic CHF, cor pulmonale :  admitted to stepdown in view of her trach collar and need for intermittent suctioning.  - did well overnight on the first 24 hrs after admission. Continue to improve, able to tolerate passy muir valve trials on 4/14. Still with signif secretions by 4/16 - sputum culture pending  - placed on  empiric broad spectrum abx with Vanc and Zosyn on admission  2. Acute on Chronic systolic and diastolic heart failure: appears to be decompensated. BNP up, LE with marked edema, crakles in the lungs - started lasix iv 80 mg every 8 hours - good response - decreased dose to 80 mg iv BID on 4/15  3. CKD stage 4: baseline creat is 2.6 i wonder if the patient was compliant with diuretics at home because on admission creatinine was down to 2 and patient had severe edema. Creatinine up to 2.6 after diuresis - continue to monitor.   4. Anemia of chronic disease. Stable. No signs of bleeding     Code Status: full  Family Communication: no family present  Disposition Plan: home     Antibiotics: Vanc and Zosyn 4/13    HPI/Subjective: Feels better   Objective: Filed Vitals:   06/05/12 2050 06/05/12 2349 06/06/12 0247 06/06/12 0411  BP:  139/52  143/63  Pulse: 74 71 68 64  Temp:  98.9 F (37.2 C)  98.4 F (36.9 C)  TempSrc:  Oral  Oral  Resp: 21 21 22 20   Height:      Weight:      SpO2: 99% 95% 98% 98%   Patient Vitals for the past 24 hrs:  BP Temp Temp src Pulse Resp SpO2  06/06/12 0411 143/63 mmHg 98.4 F (36.9 C) Oral 64 20 98 %  06/06/12 0247 - - - 68 22 98 %  06/05/12 2349 139/52 mmHg 98.9 F (37.2 C) Oral 71 21 95 %  06/05/12 2050 - - - 74 21 99 %  06/05/12 2047 150/57 mmHg 98.6 F (37 C) - - - -  06/05/12 1544 149/63 mmHg - -  77 19 97 %  06/05/12 1503 149/63 mmHg 98.7 F (37.1 C) Oral 68 32 99 %  06/05/12 1200 - - - 69 24 99 %  06/05/12 1151 106/65 mmHg 98.6 F (37 C) Oral 68 25 99 %  06/05/12 0904 - - - 70 26 98 %  06/05/12 0737 143/61 mmHg 98.8 F (37.1 C) Oral 67 23 97 %     Intake/Output Summary (Last 24 hours) at 06/06/12 0728 Last data filed at 06/06/12 8119  Gross per 24 hour  Intake    750 ml  Output   2425 ml  Net  -1675 ml   Filed Weights   06/04/12 1430 06/04/12 1500  Weight: 76.4 kg (168 lb 6.9 oz) 76.4 kg (168 lb 6.9 oz)    Exam:   General:  Alert  Neck with trach in place   Cardiovascular: rrr  Respiratory: more rhonchi this am , no wheezes , no crackles   Abdomen: soft, nt   Musculoskeletal: decaresd edema    Data Reviewed: Basic Metabolic Panel:  Recent Labs Lab 06/04/12 0932 06/05/12 0655 06/06/12 0550  NA 142 142 143  K 5.8* 3.5 3.2*  CL 105 105 103  CO2 26 30  30  GLUCOSE 96 158* 94  BUN 27* 36* 37*  CREATININE 2.00* 2.55* 2.68*  CALCIUM 9.8 9.0 9.2   Liver Function Tests: No results found for this basename: AST, ALT, ALKPHOS, BILITOT, PROT, ALBUMIN,  in the last 168 hours No results found for this basename: LIPASE, AMYLASE,  in the last 168 hours No results found for this basename: AMMONIA,  in the last 168 hours CBC:  Recent Labs Lab 06/04/12 0932 06/05/12 0655  WBC 10.0 7.5  NEUTROABS 7.1  --   HGB 11.9* 9.1*  HCT 39.4 29.6*  MCV 83.7 81.3  PLT 139* 113*   Cardiac Enzymes:  Recent Labs Lab 06/04/12 0932  TROPONINI <0.30   BNP (last 3 results)  Recent Labs  04/28/12 1611 05/15/12 0741 06/04/12 0932  PROBNP 3133.0* 1451.0* 8639.0*   CBG:  Recent Labs Lab 06/04/12 1616 06/04/12 2148  GLUCAP 91 164*    Recent Results (from the past 240 hour(s))  CULTURE, BLOOD (ROUTINE X 2)     Status: None   Collection Time    06/04/12 12:38 PM      Result Value Range Status   Specimen Description BLOOD LEFT ARM   Final   Special  Requests BOTTLES DRAWN AEROBIC AND ANAEROBIC 10CC   Final   Culture  Setup Time 06/04/2012 17:37   Final   Culture     Final   Value:        BLOOD CULTURE RECEIVED NO GROWTH TO DATE CULTURE WILL BE HELD FOR 5 DAYS BEFORE ISSUING A FINAL NEGATIVE REPORT   Report Status PENDING   Incomplete  CULTURE, BLOOD (ROUTINE X 2)     Status: None   Collection Time    06/04/12 12:45 PM      Result Value Range Status   Specimen Description BLOOD LEFT ARM   Final   Special Requests BOTTLES DRAWN AEROBIC AND ANAEROBIC 10CC   Final   Culture  Setup Time 06/04/2012 17:37   Final   Culture     Final   Value:        BLOOD CULTURE RECEIVED NO GROWTH TO DATE CULTURE WILL BE HELD FOR 5 DAYS BEFORE ISSUING A FINAL NEGATIVE REPORT   Report Status PENDING   Incomplete  MRSA PCR SCREENING     Status: None   Collection Time    06/04/12  3:35 PM      Result Value Range Status   MRSA by PCR NEGATIVE  NEGATIVE Final   Comment:            The GeneXpert MRSA Assay (FDA     approved for NASAL specimens     only), is one component of a     comprehensive MRSA colonization     surveillance program. It is not     intended to diagnose MRSA     infection nor to guide or     monitor treatment for     MRSA infections.  CULTURE, RESPIRATORY (NON-EXPECTORATED)     Status: None   Collection Time    06/04/12  4:26 PM      Result Value Range Status   Specimen Description TRACHEAL ASPIRATE   Final   Special Requests NONE   Final   Gram Stain     Final   Value: ABUNDANT WBC PRESENT, PREDOMINANTLY PMN     RARE SQUAMOUS EPITHELIAL CELLS PRESENT     RARE GRAM POSITIVE COCCI IN PAIRS     RARE GRAM NEGATIVE  RODS   Culture Culture reincubated for better growth   Final   Report Status PENDING   Incomplete     Studies: Dg Chest Port 1 View  06/04/2012  *RADIOLOGY REPORT*  Clinical Data: Shortness of breath.  PORTABLE CHEST - 1 VIEW 06/05/2011 1153 hours:  Comparison: Portable chest x-ray 05/18/2012, 05/15/2012 and two- view  chest x-ray 04/28/1012.  Findings: Dense consolidation in the left lower lobe and lingula, progressive since the 05/18/2012 examination.  Right lung remains essentially clear.  Cardiac silhouette enlarged but stable.  The patient's chin obscures the left apex.  Tracheostomy tube tip below the thoracic inlet.  IMPRESSION: Worsening dense atelectasis and/or pneumonia in the left lower lobe and lingula since 03/ 27/2014.   Original Report Authenticated By: Hulan Saas, M.D.     Scheduled Meds: . albuterol  2.5 mg Nebulization Q6H  . amLODipine  10 mg Oral Daily  . aspirin  81 mg Oral Daily  . busPIRone  5 mg Oral BID  . carvedilol  6.25 mg Oral BID WC  . enoxaparin (LOVENOX) injection  30 mg Subcutaneous Q24H  . furosemide  80 mg Intravenous Q8H  . hydrALAZINE  100 mg Oral TID  . isosorbide mononitrate  30 mg Oral Daily  . metoCLOPramide  5 mg Oral QID  . pantoprazole  20 mg Oral QPM  . piperacillin-tazobactam (ZOSYN)  IV  3.375 g Intravenous Q8H  . sertraline  100 mg Oral QHS  . simvastatin  10 mg Oral q1800  . sodium chloride  3 mL Intravenous Q12H  . sodium chloride  3 mL Intravenous Q12H  . vancomycin  1,000 mg Intravenous Q48H   Continuous Infusions:   Principal Problem:   Acute-on-chronic respiratory failure Active Problems:   Type 2 diabetes mellitus with vascular disease   Hyperlipdemia   Obesity (BMI 30-39.9)   Sleep apnea   Hypertensive heart disease with congestive heart failure   CORONARY ARTERY DISEASE   Chronic cor pulmonale   C O P D   Chronic kidney disease (CKD), stage IV (severe)   Bifascicular block   Anemia   Acute on chronic systolic heart failure   Cirrhosis of liver   Tracheostomy complication   Cardiac arrest   HCAP (healthcare-associated pneumonia)   Tracheostomy status      Jeaneen Cala  [facility] Pager [phone]. If 7PM-7AM, please contact night-coverage at [email or web address], password [facility] 06/06/2012, 7:28 AM  LOS: 2 days

## 2012-06-06 NOTE — Progress Notes (Signed)
Pharmacy: Vancomycin and Zosyn   71yof started on antibiotics 4/13 for HCAP. She has a history of CKD stage IV, but sCr continues to rise from 2 to 2.55 to 2.68 today, likely due to IV diuresis. Diuretic dose decreased to bid.  4/13 Vancomycin>> 4/13 Zosyn>> 4/13 blood>> ngtd 4/13 respiratory>> normal flora, final  Plan: 1) Continue vancomycin 1000mg  IV q48 2) Change zosyn to 2.25g IV q6 3) Watch renal function  Louie Casa, PharmD, BCPS  06/06/12, 10:02 AM

## 2012-06-06 NOTE — Evaluation (Signed)
Physical Therapy Evaluation Patient Details Name: [name] MRN: [id number] DOB: 02-19-1941 Today's Date: 06/06/2012 Time: 7829-5621 PT Time Calculation (min): 22 min  PT Assessment / Plan / Recommendation Clinical Impression  Patient is a 72 y/o female from home due to mucous plugging of her tracheostomy.  She presents to PT with decreased activity tolerance, decreased LE strength, postural deficits and decreased balance.  She will benefit from skilled PT in the acute setting to allow return home with family assist and resuming HHPT services.    PT Assessment  Patient needs continued PT services    Follow Up Recommendations  Home health PT;Supervision/Assistance - 24 hour          Equipment Recommendations  None recommended by PT       Frequency Min 3X/week    Precautions / Restrictions Precautions Precautions: Fall Precaution Comments: Trach collar   Pertinent Vitals/Pain No pain complaints      Mobility  Bed Mobility Bed Mobility: Not assessed Transfers Sit to Stand: 4: Min assist;With upper extremity assist;From chair/3-in-1 Stand to Sit: 4: Min assist;With upper extremity assist;To bed Details for Transfer Assistance: min v/c for hand placement Ambulation/Gait Ambulation/Gait Assistance: 4: Min assist Ambulation Distance (Feet): 20 Feet Assistive device: Rolling walker Ambulation/Gait Assistance Details: in room only per pt request and fatigues quickly; demonstrates right hip higher, hips flexed and keeps walker out in front Gait Pattern: Step-through pattern;Shuffle;Wide base of support;Trunk flexed;Trunk rotated posteriorly on right        PT Diagnosis: Abnormality of gait;Generalized weakness;Acute pain  PT Problem List: Decreased strength;Decreased activity tolerance;Decreased balance;Decreased mobility;Decreased knowledge of use of DME PT Treatment Interventions: DME instruction;Gait training;Functional mobility training;Therapeutic  activities;Therapeutic exercise;Patient/family education;Balance training   PT Goals Acute Rehab PT Goals PT Goal Formulation: With patient Time For Goal Achievement: 06/20/12 Potential to Achieve Goals: Good Pt will go Supine/Side to Sit: with supervision PT Goal: Supine/Side to Sit - Progress: Goal set today Pt will go Sit to Supine/Side: with supervision PT Goal: Sit to Supine/Side - Progress: Goal set today Pt will go Sit to Stand: with supervision PT Goal: Sit to Stand - Progress: Goal set today Pt will go Stand to Sit: with supervision PT Goal: Stand to Sit - Progress: Goal set today Pt will Transfer Bed to Chair/Chair to Bed: with supervision PT Transfer Goal: Bed to Chair/Chair to Bed - Progress: Goal set today Pt will Stand: with supervision;with unilateral upper extremity support;3 - 5 min PT Goal: Stand - Progress: Goal set today Pt will Ambulate: 51 - 150 feet;with least restrictive assistive device PT Goal: Ambulate - Progress: Goal set today  Visit Information  Last PT Received On: 06/06/12    Subjective Data  Subjective: I need to use the bathroom Patient Stated Goal: Return home with help of neice and daughter   Prior Functioning  Home Living Lives With: Daughter;Other (Comment) (niece) Available Help at Discharge: Family Type of Home: Apartment Home Access: Level entry Home Layout: One level Bathroom Shower/Tub: Tub/shower unit;Curtain Firefighter: Standard Home Adaptive Equipment: Bedside commode/3-in-1;Built-in shower seat;Straight cane;Wheelchair - manual Prior Function Level of Independence: Independent with assistive device(s) Meal Prep: Total Light Housekeeping: Total Able to Take Stairs?: No Driving: No Vocation: Retired Musician: Engineer, materials Dominant Hand: Right    Cognition  Cognition Overall Cognitive Status: Appears within functional limits for tasks assessed/performed Arousal/Alertness:  Awake/alert Orientation Level: Appears intact for tasks assessed Behavior During Session: Flat affect    Extremity/Trunk Assessment Right Upper Extremity Assessment  RUE ROM/Strength/Tone: Within functional levels RUE Sensation: WFL - Light Touch RUE Coordination: WFL - gross/fine motor Left Upper Extremity Assessment LUE ROM/Strength/Tone: Within functional levels LUE Sensation: WFL - Light Touch LUE Coordination: WFL - gross/fine motor Right Lower Extremity Assessment RLE ROM/Strength/Tone: Deficits RLE ROM/Strength/Tone Deficits: AROM WFL, strength hip flexion 4/5, knee extension 4+/5, flexion 4-/5 Left Lower Extremity Assessment LLE ROM/Strength/Tone: Deficits LLE ROM/Strength/Tone Deficits: AROM WFL, strength hip flexion 4/5, knee extension 4+/5, flexion 4-/5 Trunk Assessment Trunk Assessment: Kyphotic;Other exceptions Trunk Exceptions: scoliotic   Balance Balance Balance Assessed: Yes Static Standing Balance Static Standing - Balance Support: Left upper extremity supported;During functional activity Static Standing - Level of Assistance: 4: Min assist Static Standing - Comment/# of Minutes: ~ 2 minutes - unable to static stand with BIL UE unsupported High Level Balance High Level Balance Activites: Backward walking High Level Balance Comments: required max (A) with RW  End of Session PT - End of Session Equipment Utilized During Treatment: Oxygen Activity Tolerance: Patient limited by fatigue Patient left: in chair;with call bell/phone within reach  GP     [facility] 06/06/2012, 2:09 PM [place], [place] [phone] 06/06/2012

## 2012-06-06 NOTE — Evaluation (Signed)
Occupational Therapy Evaluation Patient Details Name: [name] MRN: [id number] DOB: Sep 26, 1940 Today's Date: 06/06/2012 Time: 8657-8469 OT Time Calculation (min): 22 min  OT Assessment / Plan / Recommendation Clinical Impression  72 yo fmeale admitted forAcute on chronic respiratory failure due to mucous plugging . Pt also reporting losing PMV at home. Pt with x6 admission in last 6 months. Most recent in March for respiratory failure. Ot to follow acutely. Recommend HHOT at this time. If family is unable to assist may need SNF     OT Assessment  Patient needs continued OT Services    Follow Up Recommendations  Home health OT    Barriers to Discharge      Equipment Recommendations  Other (comment) (RW)    Recommendations for Other Services    Frequency  Min 2X/week    Precautions / Restrictions Precautions Precautions: Fall Precaution Comments: Trach collar   Pertinent Vitals/Pain HR 120 during session DOE    ADL  Grooming: Wash/dry face;Wash/dry hands;Minimal assistance Where Assessed - Grooming: Supported standing (resting Left UE on sink for balance using only Rt UE ) Toilet Transfer: Minimal assistance Toilet Transfer Method: Sit to Barista: Raised toilet seat with arms (or 3-in-1 over toilet) Toileting - Clothing Manipulation and Hygiene: Minimal assistance Where Assessed - Toileting Clothing Manipulation and Hygiene: Sit to stand from 3-in-1 or toilet Equipment Used: Gait belt;Rolling walker Transfers/Ambulation Related to ADLs: Pt ambulated ~15 ft total the entire session and completely fatigued. Pt needing to sit s/p hand washing. Pt required MOD v/c to utilize the RW ADL Comments: Pt agreeable to therapy and requesting 3n1. pt ambulated to 3n1 . Pt washing hands at sink with LT UE on sink and using Rt hand to complete all hygiene. pt is unable to static stand with BIL UE used during task    OT Diagnosis: Generalized weakness  OT  Problem List: Decreased strength;Decreased activity tolerance;Impaired balance (sitting and/or standing);Decreased safety awareness;Decreased knowledge of use of DME or AE;Decreased knowledge of precautions OT Treatment Interventions: Self-care/ADL training;DME and/or AE instruction;Energy conservation;Therapeutic activities;Patient/family education;Balance training   OT Goals Acute Rehab OT Goals OT Goal Formulation: With patient Time For Goal Achievement: 06/20/12 Potential to Achieve Goals: Good ADL Goals Pt Will Perform Upper Body Bathing: with supervision;Sitting, chair ADL Goal: Upper Body Bathing - Progress: Goal set today Pt Will Perform Lower Body Bathing: with min assist;Sit to stand from bed;Sit to stand from chair ADL Goal: Lower Body Bathing - Progress: Goal set today Pt Will Transfer to Toilet: with supervision;Ambulation;3-in-1 ADL Goal: Toilet Transfer - Progress: Goal set today Pt Will Perform Toileting - Clothing Manipulation: with supervision;Sitting on 3-in-1 or toilet ADL Goal: Toileting - Clothing Manipulation - Progress: Goal set today Additional ADL Goal #1: Pt will actively participate in 20 mins of therapeutic activity with no more than one rest break ADL Goal: Additional Goal #1 - Progress: Goal set today  Visit Information  Last OT Received On: 06/06/12 PT/OT Co-Evaluation/Treatment: Yes    Subjective Data  Subjective: "I do it all myself they dont have to help me" Patient Stated Goal: return to apartment with family   Prior Functioning     Home Living Lives With: Daughter;Other (Comment) (niece) Available Help at Discharge: Family Type of Home: Apartment Home Access: Level entry Home Layout: One level Bathroom Shower/Tub: Tub/shower unit;Curtain Firefighter: Standard Home Adaptive Equipment: Bedside commode/3-in-1;Built-in shower seat;Straight cane;Wheelchair - manual Prior Function Level of Independence: Independent with assistive  device(s) Meal Prep:  Total Light Housekeeping: Total Able to Take Stairs?: No Driving: No Vocation: Retired Musician: Passy-Muir valve;Tracheostomy Dominant Hand: Right         Vision/Perception Vision - History Baseline Vision: Other (comment) (lost her glasses for reading and watching TV) Vision - Assessment Vision Assessment: Vision tested Ocular Range of Motion: Within Functional Limits Alignment/Gaze Preference: Within Defined Limits Tracking/Visual Pursuits: Able to track stimulus in all quads without difficulty Saccades: Within functional limits   Cognition  Cognition Overall Cognitive Status: Appears within functional limits for tasks assessed/performed Arousal/Alertness: Awake/alert Orientation Level: Appears intact for tasks assessed Behavior During Session: Flat affect    Extremity/Trunk Assessment Right Upper Extremity Assessment RUE ROM/Strength/Tone: Within functional levels RUE Sensation: WFL - Light Touch RUE Coordination: WFL - gross/fine motor Left Upper Extremity Assessment LUE ROM/Strength/Tone: Within functional levels LUE Sensation: WFL - Light Touch LUE Coordination: WFL - gross/fine motor Right Lower Extremity Assessment RLE ROM/Strength/Tone: Deficits RLE ROM/Strength/Tone Deficits: AROM WFL, strength hip flexion 4/5, knee extension 4+/5, flexion 4-/5 Left Lower Extremity Assessment LLE ROM/Strength/Tone: Deficits LLE ROM/Strength/Tone Deficits: AROM WFL, strength hip flexion 4/5, knee extension 4+/5, flexion 4-/5 Trunk Assessment Trunk Assessment: Kyphotic;Other exceptions Trunk Exceptions: scoliotic     Mobility Bed Mobility Bed Mobility: Not assessed Transfers Transfers: Sit to Stand;Stand to Sit Sit to Stand: 4: Min assist;With upper extremity assist;From chair/3-in-1 Stand to Sit: 4: Min assist;With upper extremity assist;To bed Details for Transfer Assistance: min v/c for hand placement     Exercise      Balance Balance Balance Assessed: Yes Static Standing Balance Static Standing - Balance Support: Left upper extremity supported;During functional activity Static Standing - Level of Assistance: 4: Min assist Static Standing - Comment/# of Minutes: ~ 2 minutes - unable to static stand with BIL UE unsupported High Level Balance High Level Balance Activites: Backward walking High Level Balance Comments: required max (A) with RW   End of Session OT - End of Session Activity Tolerance: Patient limited by fatigue Patient left: in chair;with call bell/phone within reach Nurse Communication: Mobility status;Precautions  GO     Lucile Shutters 06/06/2012, 2:02 PM Pager: [phone]

## 2012-06-06 NOTE — Evaluation (Signed)
Clinical/Bedside Swallow Evaluation Patient Details  Name: [name] MRN: [id number] Date of Birth: 1940/08/22  Today's Date: 06/06/2012 Time: 0826-0850 SLP Time Calculation (min): 24 min  Past Medical History:  Past Medical History  Diagnosis Date  . COPD (chronic obstructive pulmonary disease)     on home o2  . Chronic renal insufficiency   . CVA (cerebral infarction)     hx  . Hypertensive heart disease with congestive heart failure   . Chronic cor pulmonale   . Cardiomyopathy of undetermined type 09/07/2011  . Chronic kidney disease (CKD), stage IV (severe)   . Hyperlipdemia   . Obesity (BMI 30-39.9)   . Sleep apnea   . History of gout 09/07/2011  . Heart failure 7/13  . Shortness of breath   . Anemia   . Vascular disease   . Asthma   . Anginal pain   . Hypertension   . Type II or unspecified type diabetes mellitus without mention of complication, not stated as uncontrolled   . Stroke   . GERD (gastroesophageal reflux disease)   . Arthritis   . Bundle branch block   . Thrombocytopenia   . Acute and chronic respiratory failure    Past Surgical History:  Past Surgical History  Procedure Laterality Date  . Bilateral oophorectomy    . Cardiac catheterization      right heart cath  . Laparoscopic gastrostomy  12/21/2011    Procedure: LAPAROSCOPIC GASTROSTOMY;  Surgeon: Axel Filler, MD;  Location: [facility] OR;  Service: General;  Laterality: N/A;  laparoscopic gastrostomy possible open feeding tube  . Abdominal hysterectomy    . Tracheostomy  11/2011  . Cataracts    . Tracheostomy tube placement  02/18/2012    Procedure: TRACHEOSTOMY;  Surgeon: Serena Colonel, MD;  Location: [facility] OR;  Service: ENT;  Laterality: N/A;   HPI:  Pt admitted to [facility] 11/14/11 with OHS/OSA, diastolic and systolic CHF, COPD, DM, HTN, hypercarbic respiratory failure with intubation 9/22-9/26, re-intubation 9/30, and trach 10/1. G-tube placed 10/29, Passy-Muir valve eval 10/2; D/C'd to [facility]  10/3 with subsequent D/C to [facility]. Pt has been NPO for this duration. Readmitted 12/27/11 for cellulitis around G-Tube. MBS 11/19 recommended Dys 3/thin liquids. Pt now admitted due to mucous plug. Pt reports she las lost her PMSV at home. Her primary caregiver, her daughter, is also currently admitted to [facility].    Assessment / Plan / Recommendation Clinical Impression  Pt demonstrated adequate oral and oropharyngeal function. There was no evidence of aspiration, supported objectively by MBS in 11/13 recommending dys 3 diet and thin liquids.  Required therapeutic intervention including proper positioning and education regarding wearking PMSV for PO intake. Recommend pt upgrade to dys 3/thin (due to missing dentition). No SLP f/u needed for dysphagia. Will continue to follow for PMSV therapy and education.     Aspiration Risk  Mild    Diet Recommendation Dysphagia 3 (Mechanical Soft);Thin liquid   Liquid Administration via: Cup;Straw Medication Administration: Whole meds with liquid Supervision: Patient able to self feed Compensations: Slow rate;Small sips/bites Postural Changes and/or Swallow Maneuvers: Seated upright 90 degrees    Other  Recommendations Oral Care Recommendations: Oral care BID   Follow Up Recommendations  None    Frequency and Duration        Pertinent Vitals/Pain NA    SLP Swallow Goals     Swallow Study Prior Functional Status       General HPI: Pt admitted to [facility] 11/14/11 with OHS/OSA,  diastolic and systolic CHF, COPD, DM, HTN, hypercarbic respiratory failure with intubation 9/22-9/26, re-intubation 9/30, and trach 10/1. G-tube placed 10/29, Passy-Muir valve eval 10/2; D/C'd to [facility] 10/3 with subsequent D/C to [facility]. Pt has been NPO for this duration. Readmitted 12/27/11 for cellulitis around G-Tube. MBS 11/19 recommended Dys 3/thin liquids. Pt now admitted due to mucous plug. Pt reports she las lost her PMSV at home. Her primary caregiver,  her daughter, is also currently admitted to [facility].  Type of Study: Bedside swallow evaluation Previous Swallow Assessment: see HPI Diet Prior to this Study: Dysphagia 1 (puree);Nectar-thick liquids Temperature Spikes Noted: No Respiratory Status: Trach Trach Size and Type: #4;Uncuffed;With PMSV in place History of Recent Intubation: No Behavior/Cognition: Alert;Cooperative;Pleasant mood Oral Cavity - Dentition: Edentulous Self-Feeding Abilities: Able to feed self Patient Positioning: Upright in bed Baseline Vocal Quality: Clear Volitional Cough: Strong Volitional Swallow: Able to elicit    Oral/Motor/Sensory Function Overall Oral Motor/Sensory Function: Appears within functional limits for tasks assessed   Ice Chips     Thin Liquid Thin Liquid: Within functional limits Presentation: Cup;Straw;Self Fed    Nectar Thick Nectar Thick Liquid: Not tested   Honey Thick Honey Thick Liquid: Not tested   Puree Puree: Within functional limits   Solid   GO    Solid: Within functional limits      [facility], [place] CCC-SLP [phone]  [name] 06/06/2012,9:09 AM

## 2012-06-06 NOTE — Care Management Note (Unsigned)
    Page 1 of 2   06/09/2012     3:22:20 PM   CARE MANAGEMENT NOTE 06/09/2012  Patient:  [name], [name]   Account Number:  [id number]  Date Initiated:  06/05/2012  Documentation initiated by:  Donn Pierini  Subjective/Objective Assessment:   Pt admitted with resp. failure- hx of trach     Action/Plan:   PTA pt lived at home with [name]- active with [name] for HH-RN- and daughter was caregiver   Anticipated DC Date:  06/09/2012   Anticipated DC Plan:  HOME W HOME HEALTH SERVICES      DC Planning Services  CM consult      [name] Choice  Resumption Of Svcs/PTA Provider   Choice offered to / List presented to:  C-1 Patient   DME arranged  [facility]        HH arranged  HH-1 RN  HH-2 PT  HH-3 OT  HH-6 SOCIAL WORKER      HH agency  [facility]   Status of service:  In process, will continue to follow Medicare Important Message given?   (If response is "NO", the following Medicare IM given date fields will be blank) Date Medicare IM given:   Date Additional Medicare IM given:    Discharge Disposition:  HOME W HOME HEALTH SERVICES  Per UR Regulation:  Reviewed for med. necessity/level of care/duration of stay  If discussed at Long Length of Stay Meetings, dates discussed:    Comments:   06/09/12 [name],RN,BSN [phone] PT FOR LIKELY DC WITHIN NEXT 24HRS.  NOTIFIED [facility] OF DC DATE.  REFERRAL TO [facility] FOR DME NEEDS.  PT STATES SHE WILL DC HOME WITH DAUGHTER AND HH SERVICES AS PRIOR TO ADMISSION.  STATES DAUGHTER WORKS "SOME", BUT CURRENTLY HOME ALL THE TIME.  06/06/12- 1120- Donn Pierini RN, BSN [phone] Spoke with pt at bedside- per conversation pt agreeable to resuming HH with [facility]- states that she also has a niece that is going to come help some at home - plan is to go to daughter's home for now- orders to resume HH in chart- will add a CSW although at this time daughter does not want mom to go to SNF- it may be more than they can handle at  home with both pt and daughter being in [name] recently and both being following by [facility]- call made to Lupita Leash with [name] regarding resumption of HH at discharge. HH will resume within 24-48 post discharge. Pt did state she could use a RW for home- MD would you please place order for DME.-   06/05/12- 1100- Donn Pierini RN, BSN [phone] Pt admitted with Trach plugging- pt from home with daughter as caregiver- however daughter is currently also in [name] as a pt and at this time unable to be the caregiver for mom. PT/OT evals pending. NCM to follow for d/c needs. Pt is active with [name] for HH-RN

## 2012-06-07 DIAGNOSIS — J189 Pneumonia, unspecified organism: Secondary | ICD-10-CM

## 2012-06-07 LAB — CBC
MCH: 24.6 pg — ABNORMAL LOW (ref 26.0–34.0)
MCHC: 30.9 g/dL (ref 30.0–36.0)
MCV: 79.5 fL (ref 78.0–100.0)
Platelets: 145 10*3/uL — ABNORMAL LOW (ref 150–400)
RBC: 3.91 MIL/uL (ref 3.87–5.11)
RDW: 16.9 % — ABNORMAL HIGH (ref 11.5–15.5)

## 2012-06-07 LAB — BASIC METABOLIC PANEL
CO2: 32 mEq/L (ref 19–32)
Calcium: 9.2 mg/dL (ref 8.4–10.5)
Creatinine, Ser: 2.82 mg/dL — ABNORMAL HIGH (ref 0.50–1.10)
GFR calc non Af Amer: 16 mL/min — ABNORMAL LOW (ref >90)
Glucose, Bld: 98 mg/dL (ref 70–99)
Sodium: 143 mEq/L (ref 135–145)

## 2012-06-07 MED ORDER — LEVOFLOXACIN 250 MG PO TABS
250.0000 mg | ORAL_TABLET | Freq: Every day | ORAL | Status: DC
  Administered 2012-06-07 – 2012-06-12 (×6): 250 mg via ORAL
  Filled 2012-06-07 (×6): qty 1

## 2012-06-07 MED ORDER — TORSEMIDE 20 MG PO TABS
20.0000 mg | ORAL_TABLET | Freq: Two times a day (BID) | ORAL | Status: DC
  Administered 2012-06-07 – 2012-06-09 (×4): 20 mg via ORAL
  Filled 2012-06-07 (×7): qty 1

## 2012-06-07 NOTE — Progress Notes (Signed)
[facility] PROGRESS NOTE  [name] UJW:[id number] DOB: [birth year] DOA: 06/04/2012 PCP: Gwynneth Aliment, MD  Assessment/Plan: 1. Acute on chronic respiratory failure due to mucous plugging , HCAP, acute on chronic diastolic CHF, cor pulmonale : - Change IV antibiotics to oral. Received 4 days IV vancomycin and Zosyn. Day 4 antibiotics. Start Levaquin. Still with moderate amount of secretion.  Discontinue IV lasix. Resume home dose Bumex.  2. Acute on Chronic systolic and diastolic heart failure: started lasix iv 80 mg every 8 hours - good response - decreased dose to 80 mg iv BID on 4/15. I will discontinue lasix, resume Bumex.  3. CKD stage 4: baseline creat is 2.6. Creatinine up to 2.8 after diuresis - continue to monitor. Discontinue IV lasix, resume Bumex. Repeat renal function in am.  4. Anemia of chronic disease. Stable. No signs of bleeding  5.Hypokalemia: 40 meq PO daily.    Code Status: full  Family Communication: no family present  Disposition Plan: home    Consultants:  none  Procedures:  none  Antibiotics: Vanc and Zosyn 4/13 ----4-16 Levaquin 4-16   HPI/Subjective: Feeling better, breathing better.   Objective: Filed Vitals:   06/07/12 0427 06/07/12 0453 06/07/12 0741 06/07/12 0848  BP: 138/52     Pulse: 73 68    Temp: 99.9 F (37.7 C)  98.7 F (37.1 C)   TempSrc: Oral  Oral   Resp: 18 26    Height:      Weight:      SpO2: 97% 98%  96%    Intake/Output Summary (Last 24 hours) at 06/07/12 0858 Last data filed at 06/07/12 0741  Gross per 24 hour  Intake    880 ml  Output   1375 ml  Net   -495 ml   Filed Weights   06/04/12 1430 06/04/12 1500  Weight: 76.4 kg (168 lb 6.9 oz) 76.4 kg (168 lb 6.9 oz)    Exam:   General:  No distress.   Cardiovascular: S 1, S 2 RRR  Respiratory: few ronchus  Abdomen:Bs present, soft, NT  Musculoskeletal: trace edema.   Data Reviewed: Basic Metabolic Panel:  Recent Labs Lab  06/04/12 0932 06/05/12 0655 06/06/12 0550 06/07/12 0555  NA 142 142 143 143  K 5.8* 3.5 3.2* 3.4*  CL 105 105 103 103  CO2 26 30 30  32  GLUCOSE 96 158* 94 98  BUN 27* 36* 37* 35*  CREATININE 2.00* 2.55* 2.68* 2.82*  CALCIUM 9.8 9.0 9.2 9.2   Liver Function Tests: No results found for this basename: AST, ALT, ALKPHOS, BILITOT, PROT, ALBUMIN,  in the last 168 hours No results found for this basename: LIPASE, AMYLASE,  in the last 168 hours No results found for this basename: AMMONIA,  in the last 168 hours CBC:  Recent Labs Lab 06/04/12 0932 06/05/12 0655 06/07/12 0555  WBC 10.0 7.5 7.5  NEUTROABS 7.1  --   --   HGB 11.9* 9.1* 9.6*  HCT 39.4 29.6* 31.1*  MCV 83.7 81.3 79.5  PLT 139* 113* 145*   Cardiac Enzymes:  Recent Labs Lab 06/04/12 0932  TROPONINI <0.30   BNP (last 3 results)  Recent Labs  04/28/12 1611 05/15/12 0741 06/04/12 0932  PROBNP 3133.0* 1451.0* 8639.0*   CBG:  Recent Labs Lab 06/04/12 1616 06/04/12 2148  GLUCAP 91 164*    Recent Results (from the past 240 hour(s))  CULTURE, BLOOD (ROUTINE X 2)     Status: None   Collection Time  06/04/12 12:38 PM      Result Value Range Status   Specimen Description BLOOD LEFT ARM   Final   Special Requests BOTTLES DRAWN AEROBIC AND ANAEROBIC 10CC   Final   Culture  Setup Time 06/04/2012 17:37   Final   Culture     Final   Value:        BLOOD CULTURE RECEIVED NO GROWTH TO DATE CULTURE WILL BE HELD FOR 5 DAYS BEFORE ISSUING A FINAL NEGATIVE REPORT   Report Status PENDING   Incomplete  CULTURE, BLOOD (ROUTINE X 2)     Status: None   Collection Time    06/04/12 12:45 PM      Result Value Range Status   Specimen Description BLOOD LEFT ARM   Final   Special Requests BOTTLES DRAWN AEROBIC AND ANAEROBIC 10CC   Final   Culture  Setup Time 06/04/2012 17:37   Final   Culture     Final   Value:        BLOOD CULTURE RECEIVED NO GROWTH TO DATE CULTURE WILL BE HELD FOR 5 DAYS BEFORE ISSUING A FINAL NEGATIVE  REPORT   Report Status PENDING   Incomplete  MRSA PCR SCREENING     Status: None   Collection Time    06/04/12  3:35 PM      Result Value Range Status   MRSA by PCR NEGATIVE  NEGATIVE Final   Comment:            The GeneXpert MRSA Assay (FDA     approved for NASAL specimens     only), is one component of a     comprehensive MRSA colonization     surveillance program. It is not     intended to diagnose MRSA     infection nor to guide or     monitor treatment for     MRSA infections.  CULTURE, RESPIRATORY (NON-EXPECTORATED)     Status: None   Collection Time    06/04/12  4:26 PM      Result Value Range Status   Specimen Description TRACHEAL ASPIRATE   Final   Special Requests NONE   Final   Gram Stain     Final   Value: ABUNDANT WBC PRESENT, PREDOMINANTLY PMN     RARE SQUAMOUS EPITHELIAL CELLS PRESENT     RARE GRAM POSITIVE COCCI IN PAIRS     RARE GRAM NEGATIVE RODS   Culture Non-Pathogenic Oropharyngeal-type Flora Isolated.   Final   Report Status 06/06/2012 FINAL   Final     Studies: No results found.  Scheduled Meds: . albuterol  2.5 mg Nebulization Q6H  . amLODipine  10 mg Oral Daily  . aspirin  81 mg Oral Daily  . busPIRone  5 mg Oral BID  . carvedilol  6.25 mg Oral BID WC  . enoxaparin (LOVENOX) injection  30 mg Subcutaneous Q24H  . hydrALAZINE  100 mg Oral TID  . isosorbide mononitrate  30 mg Oral Daily  . levofloxacin  250 mg Oral Daily  . metoCLOPramide  5 mg Oral QID  . pantoprazole  20 mg Oral QPM  . potassium chloride  40 mEq Oral Daily  . sertraline  100 mg Oral QHS  . simvastatin  10 mg Oral q1800  . sodium chloride  3 mL Intravenous Q12H  . sodium chloride  3 mL Intravenous Q12H  . torsemide  20 mg Oral BID   Continuous Infusions:   Principal Problem:   Acute-on-chronic  respiratory failure Active Problems:   Type 2 diabetes mellitus with vascular disease   Hyperlipdemia   Obesity (BMI 30-39.9)   Sleep apnea   Hypertensive heart disease with  congestive heart failure   CORONARY ARTERY DISEASE   Chronic cor pulmonale   C O P D   Chronic kidney disease (CKD), stage IV (severe)   Bifascicular block   Anemia   Acute on chronic systolic heart failure   Cirrhosis of liver   Tracheostomy complication   Cardiac arrest   HCAP (healthcare-associated pneumonia)   Tracheostomy status    Time spent: 25 minutes.     REGALADO,BELKYS  [facility] Pager [phone]. If 7PM-7AM, please contact night-coverage at [email or web address], password [facility] 06/07/2012, 8:58 AM  LOS: 3 days

## 2012-06-08 LAB — CBC
HCT: 30.5 % — ABNORMAL LOW (ref 36.0–46.0)
MCHC: 31.5 g/dL (ref 30.0–36.0)
Platelets: 163 10*3/uL (ref 150–400)
RDW: 16.6 % — ABNORMAL HIGH (ref 11.5–15.5)
WBC: 7 10*3/uL (ref 4.0–10.5)

## 2012-06-08 LAB — BASIC METABOLIC PANEL
BUN: 32 mg/dL — ABNORMAL HIGH (ref 6–23)
Chloride: 102 mEq/L (ref 96–112)
GFR calc Af Amer: 19 mL/min — ABNORMAL LOW (ref >90)
GFR calc non Af Amer: 16 mL/min — ABNORMAL LOW (ref >90)
Potassium: 3.3 mEq/L — ABNORMAL LOW (ref 3.5–5.1)
Sodium: 140 mEq/L (ref 135–145)

## 2012-06-08 LAB — CLOSTRIDIUM DIFFICILE BY PCR: Toxigenic C. Difficile by PCR: NEGATIVE

## 2012-06-08 LAB — GLUCOSE, CAPILLARY: Glucose-Capillary: 89 mg/dL (ref 70–99)

## 2012-06-08 MED ORDER — POTASSIUM CHLORIDE CRYS ER 20 MEQ PO TBCR
20.0000 meq | EXTENDED_RELEASE_TABLET | Freq: Once | ORAL | Status: AC
  Administered 2012-06-08: 20 meq via ORAL

## 2012-06-08 MED ORDER — ALBUTEROL SULFATE (5 MG/ML) 0.5% IN NEBU: 2.5000 mg | INHALATION_SOLUTION | RESPIRATORY_TRACT | Status: DC | PRN

## 2012-06-08 NOTE — Progress Notes (Signed)
Utilization review completed.  

## 2012-06-08 NOTE — Progress Notes (Signed)
Occupational Therapy Treatment Patient Details Name: [name] MRN: [id number] DOB: 1940/08/05 Today's Date: 06/08/2012 Time: 1011-1029 OT Time Calculation (min): 18 min  OT Assessment / Plan / Recommendation Comments on Treatment Session Pt progressing this session with LB dressing and bed mobility. Pt requires strong encouragement but participated    Follow Up Recommendations  Home health OT    Barriers to Discharge       Equipment Recommendations  Other (comment)    Recommendations for Other Services    Frequency Min 2X/week   Plan Discharge plan remains appropriate    Precautions / Restrictions Precautions Precautions: Fall Precaution Comments: Trach collar Restrictions Weight Bearing Restrictions: No   Pertinent Vitals/Pain None reported     ADL  Eating/Feeding: Modified independent Where Assessed - Eating/Feeding: Edge of bed Grooming: Wash/dry face;Modified independent Where Assessed - Grooming: Unsupported sitting Lower Body Dressing: Supervision/safety Where Assessed - Lower Body Dressing: Unsupported sitting (don bil socks) Toilet Transfer: Supervision/safety Toilet Transfer Method: Sit to Barista: Raised toilet seat with arms (or 3-in-1 over toilet) Equipment Used: Rolling walker;Other (comment) (oxygen tank) Transfers/Ambulation Related to ADLs: Pt ambulate to the window to look at the flowers with decr gait velocity . Pt pulls up on the RW with cues for hand placement. Pt static standing with hip flexion leaning BIL UE on RW.  ADL Comments: Pt required strong encouragement for participation. pt agreeable reluctantely. pt completed bed mobility, LB dressing , hygiene total (A) for buttock, and ambulation to window. PT at end of session smiling and requesting OT return tomorrow. Pt informed therapy will return but not 06/09/12 Friday    OT Diagnosis:    OT Problem List:   OT Treatment Interventions:     OT Goals Acute Rehab  OT Goals OT Goal Formulation: With patient Time For Goal Achievement: 06/20/12 Potential to Achieve Goals: Good ADL Goals Pt Will Perform Upper Body Bathing: with supervision;Sitting, chair ADL Goal: Upper Body Bathing - Progress: Progressing toward goals Pt Will Perform Lower Body Bathing: with min assist;Sit to stand from bed;Sit to stand from chair ADL Goal: Lower Body Bathing - Progress: Met Pt Will Transfer to Toilet: with supervision;Ambulation;3-in-1 ADL Goal: Toilet Transfer - Progress: Progressing toward goals Pt Will Perform Toileting - Clothing Manipulation: with supervision;Sitting on 3-in-1 or toilet ADL Goal: Toileting - Clothing Manipulation - Progress: Progressing toward goals Additional ADL Goal #1: Pt will actively participate in 20 mins of therapeutic activity with no more than one rest break ADL Goal: Additional Goal #1 - Progress: Progressing toward goals  Visit Information  Last OT Received On: 06/08/12 Assistance Needed: +1 PT/OT Co-Evaluation/Treatment: Yes    Subjective Data      Prior Functioning       Cognition  Cognition Arousal/Alertness: Awake/alert Behavior During Therapy:  (smiling at end of session) Overall Cognitive Status: Within Functional Limits for tasks assessed Difficult to assess due to: Tracheostomy (PMV)    Mobility  Bed Mobility Bed Mobility: Supine to Sit;Sitting - Scoot to Edge of Bed;Sit to Supine Supine to Sit: 5: Supervision;HOB elevated Sitting - Scoot to Edge of Bed: 5: Supervision Sit to Supine: 5: Supervision;HOB elevated Transfers Transfers: Sit to Stand;Stand to Sit Sit to Stand: 5: Supervision;With upper extremity assist;Other (comment) (RW) Stand to Sit: 5: Supervision;With upper extremity assist;Other (comment) (RW) Details for Transfer Assistance: poor hand placemend pulling on RW    Exercises      Balance Static Standing Balance Static Standing - Balance Support: Bilateral upper  extremity supported;During  functional activity Static Standing - Level of Assistance: 5: Stand by assistance Static Standing - Comment/# of Minutes: pt static standing to look out window with bil ue on RW   End of Session OT - End of Session Activity Tolerance: Patient tolerated treatment well Patient left: in bed;with call bell/phone within reach Nurse Communication: Mobility status;Precautions  GO     Lucile Shutters 06/08/2012, 1:24 PM Pager: [phone]

## 2012-06-08 NOTE — Progress Notes (Signed)
[facility] PROGRESS NOTE  [name] GNF:[id number] DOB: August 05, 1940 DOA: 06/04/2012 PCP: Gwynneth Aliment, MD  Assessment/Plan: 1. Acute on chronic respiratory failure due to mucous plugging , HCAP, acute on chronic diastolic CHF, cor pulmonale : - Change IV antibiotics to oral. Received 4 days IV vancomycin and Zosyn. Day 5 antibiotics.  Levaquin started 4-17. Still with moderate amount of secretion.  Stable transfer to telemetry bed.  2. Acute on Chronic systolic and diastolic heart failure: started lasix iv 80 mg every 8 hours - good response - decreased dose to 80 mg iv BID on 4/15. resume Bumex 4-16.  3. CKD stage 4: baseline creat is 2.6. Creatinine up to 2.8 after diuresis - continue to monitor.  Repeat renal function in am. Monitor on Bumex. Cr today at 2.7 4. Anemia of chronic disease. Stable. No signs of bleeding  5.Hypokalemia: 40 meq PO daily. Will give extra dose KCL.  6-Diarrhea: no BM this morning. C diff pending.    Code Status: full  Family Communication: no family present  Disposition Plan: home    Consultants:  none  Procedures:  none  Antibiotics: Vanc and Zosyn 4/13 ----4-16 Levaquin 4-16   HPI/Subjective: Feeling better, breathing better. No BM this am.  Objective: Filed Vitals:   06/08/12 0006 06/08/12 0217 06/08/12 0411 06/08/12 0818  BP: 132/59  140/57   Pulse: 76  67   Temp: 99.4 F (37.4 C)  98.3 F (36.8 C) 99 F (37.2 C)  TempSrc: Oral  Oral Oral  Resp: 28  21   Height:      Weight:   75.2 kg (165 lb 12.6 oz)   SpO2: 95% 97% 97%     Intake/Output Summary (Last 24 hours) at 06/08/12 0841 Last data filed at 06/08/12 0819  Gross per 24 hour  Intake   1000 ml  Output    250 ml  Net    750 ml   Filed Weights   06/04/12 1430 06/04/12 1500 06/08/12 0411  Weight: 76.4 kg (168 lb 6.9 oz) 76.4 kg (168 lb 6.9 oz) 75.2 kg (165 lb 12.6 oz)    Exam:   General:  No distress.   Cardiovascular: S 1, S 2  RRR  Respiratory: few ronchus  Abdomen:Bs present, soft, NT  Musculoskeletal: trace edema.   Data Reviewed: Basic Metabolic Panel:  Recent Labs Lab 06/04/12 0932 06/05/12 0655 06/06/12 0550 06/07/12 0555 06/08/12 0321  NA 142 142 143 143 140  K 5.8* 3.5 3.2* 3.4* 3.3*  CL 105 105 103 103 102  CO2 26 30 30  32 32  GLUCOSE 96 158* 94 98 96  BUN 27* 36* 37* 35* 32*  CREATININE 2.00* 2.55* 2.68* 2.82* 2.73*  CALCIUM 9.8 9.0 9.2 9.2 9.3   Liver Function Tests: No results found for this basename: AST, ALT, ALKPHOS, BILITOT, PROT, ALBUMIN,  in the last 168 hours No results found for this basename: LIPASE, AMYLASE,  in the last 168 hours No results found for this basename: AMMONIA,  in the last 168 hours CBC:  Recent Labs Lab 06/04/12 0932 06/05/12 0655 06/07/12 0555 06/08/12 0321  WBC 10.0 7.5 7.5 7.0  NEUTROABS 7.1  --   --   --   HGB 11.9* 9.1* 9.6* 9.6*  HCT 39.4 29.6* 31.1* 30.5*  MCV 83.7 81.3 79.5 78.6  PLT 139* 113* 145* 163   Cardiac Enzymes:  Recent Labs Lab 06/04/12 0932  TROPONINI <0.30   BNP (last 3 results)  Recent Labs  04/28/12 1611 05/15/12 0741 06/04/12 0932  PROBNP 3133.0* 1451.0* 8639.0*   CBG:  Recent Labs Lab 06/04/12 1616 06/04/12 2148  GLUCAP 91 164*    Recent Results (from the past 240 hour(s))  CULTURE, BLOOD (ROUTINE X 2)     Status: None   Collection Time    06/04/12 12:38 PM      Result Value Range Status   Specimen Description BLOOD LEFT ARM   Final   Special Requests BOTTLES DRAWN AEROBIC AND ANAEROBIC 10CC   Final   Culture  Setup Time 06/04/2012 17:37   Final   Culture     Final   Value:        BLOOD CULTURE RECEIVED NO GROWTH TO DATE CULTURE WILL BE HELD FOR 5 DAYS BEFORE ISSUING A FINAL NEGATIVE REPORT   Report Status PENDING   Incomplete  CULTURE, BLOOD (ROUTINE X 2)     Status: None   Collection Time    06/04/12 12:45 PM      Result Value Range Status   Specimen Description BLOOD LEFT ARM   Final    Special Requests BOTTLES DRAWN AEROBIC AND ANAEROBIC 10CC   Final   Culture  Setup Time 06/04/2012 17:37   Final   Culture     Final   Value:        BLOOD CULTURE RECEIVED NO GROWTH TO DATE CULTURE WILL BE HELD FOR 5 DAYS BEFORE ISSUING A FINAL NEGATIVE REPORT   Report Status PENDING   Incomplete  MRSA PCR SCREENING     Status: None   Collection Time    06/04/12  3:35 PM      Result Value Range Status   MRSA by PCR NEGATIVE  NEGATIVE Final   Comment:            The GeneXpert MRSA Assay (FDA     approved for NASAL specimens     only), is one component of a     comprehensive MRSA colonization     surveillance program. It is not     intended to diagnose MRSA     infection nor to guide or     monitor treatment for     MRSA infections.  CULTURE, RESPIRATORY (NON-EXPECTORATED)     Status: None   Collection Time    06/04/12  4:26 PM      Result Value Range Status   Specimen Description TRACHEAL ASPIRATE   Final   Special Requests NONE   Final   Gram Stain     Final   Value: ABUNDANT WBC PRESENT, PREDOMINANTLY PMN     RARE SQUAMOUS EPITHELIAL CELLS PRESENT     RARE GRAM POSITIVE COCCI IN PAIRS     RARE GRAM NEGATIVE RODS   Culture Non-Pathogenic Oropharyngeal-type Flora Isolated.   Final   Report Status 06/06/2012 FINAL   Final     Studies: No results found.  Scheduled Meds: . albuterol  2.5 mg Nebulization Q6H  . amLODipine  10 mg Oral Daily  . aspirin  81 mg Oral Daily  . busPIRone  5 mg Oral BID  . carvedilol  6.25 mg Oral BID WC  . enoxaparin (LOVENOX) injection  30 mg Subcutaneous Q24H  . hydrALAZINE  100 mg Oral TID  . isosorbide mononitrate  30 mg Oral Daily  . levofloxacin  250 mg Oral Daily  . metoCLOPramide  5 mg Oral QID  . pantoprazole  20 mg Oral QPM  . potassium chloride  40 mEq  Oral Daily  . sertraline  100 mg Oral QHS  . simvastatin  10 mg Oral q1800  . sodium chloride  3 mL Intravenous Q12H  . sodium chloride  3 mL Intravenous Q12H  . torsemide  20 mg  Oral BID   Continuous Infusions:   Principal Problem:   Acute-on-chronic respiratory failure Active Problems:   Type 2 diabetes mellitus with vascular disease   Hyperlipdemia   Obesity (BMI 30-39.9)   Sleep apnea   Hypertensive heart disease with congestive heart failure   CORONARY ARTERY DISEASE   Chronic cor pulmonale   C O P D   Chronic kidney disease (CKD), stage IV (severe)   Bifascicular block   Anemia   Acute on chronic systolic heart failure   Cirrhosis of liver   Tracheostomy complication   Cardiac arrest   HCAP (healthcare-associated pneumonia)   Tracheostomy status    Time spent: 25 minutes.     Julya Alioto  [facility] Pager [phone]. If 7PM-7AM, please contact night-coverage at [email or web address], password [facility] 06/08/2012, 8:41 AM  LOS: 4 days

## 2012-06-08 NOTE — Progress Notes (Signed)
Report called to RN on 2000, VSS, All meds current, to transfer per MD order.

## 2012-06-08 NOTE — Progress Notes (Signed)
[facility] Speaking Valve - Treatment Patient Details  Name: [name] MRN: [id number] Date of Birth: 04-22-1940  Today's Date: 06/08/2012 Time: 1502-1512 SLP Time Calculation (min): 10 min  Past Medical History:  Past Medical History  Diagnosis Date  . COPD (chronic obstructive pulmonary disease)     on home o2  . Chronic renal insufficiency   . CVA (cerebral infarction)     hx  . Hypertensive heart disease with congestive heart failure   . Chronic cor pulmonale   . Cardiomyopathy of undetermined type 09/07/2011  . Chronic kidney disease (CKD), stage IV (severe)   . Hyperlipdemia   . Obesity (BMI 30-39.9)   . Sleep apnea   . History of gout 09/07/2011  . Heart failure 7/13  . Shortness of breath   . Anemia   . Vascular disease   . Asthma   . Anginal pain   . Hypertension   . Type II or unspecified type diabetes mellitus without mention of complication, not stated as uncontrolled   . Stroke   . GERD (gastroesophageal reflux disease)   . Arthritis   . Bundle branch block   . Thrombocytopenia   . Acute and chronic respiratory failure    Past Surgical History:  Past Surgical History  Procedure Laterality Date  . Bilateral oophorectomy    . Cardiac catheterization      right heart cath  . Laparoscopic gastrostomy  12/21/2011    Procedure: LAPAROSCOPIC GASTROSTOMY;  Surgeon: Axel Filler, MD;  Location: [facility] OR;  Service: General;  Laterality: N/A;  laparoscopic gastrostomy possible open feeding tube  . Abdominal hysterectomy    . Tracheostomy  11/2011  . Cataracts    . Tracheostomy tube placement  02/18/2012    Procedure: TRACHEOSTOMY;  Surgeon: Serena Colonel, MD;  Location: [facility] OR;  Service: ENT;  Laterality: N/A;    Assessment / Plan / Recommendation Clinical Impression  Pt tolerating PMSV during all waking hours. Pt responsible for placement in am and removal at night. Pt demosntrated placement and removal. Pt verbalized all precautions with question cue.  SLP provided reinforment of importance of cleaning valve and caring for trach. Pt says RT will come to do education prior to d/c. Pt is independent with valve, no SLP f/u at this time.     Plan  All goals met    Follow Up Recommendations  None    Pertinent Vitals/Pain NA    SLP Goals SLP Goal #1: Pt will wear PMSV during all waking hours with supervsion cues without change in vital signs or discomfort.  SLP Goal #1 - Progress: Met SLP Goal #2: Pt will place and remove valve with supervision cues x3 in a session.  SLP Goal #2 - Progress: Met SLP Goal #3: Pt will verbalize understanding of PMSV precuations iwth min question cues x3.  SLP Goal #3 - Progress: Met   PMSV Trial  PMSV was placed for: 10 (observed for 10 minutes) Able to redirect subglottic air through upper airway: Yes Able to Attain Phonation: Yes Voice Quality: Normal Able to Expectorate Secretions: Yes Level of Secretion Expectoration with PMSV: Oral Breath Support for Phonation: Adequate Intelligibility: Intelligible   Tracheostomy Tube  Additional Tracheostomy Tube Assessment Trach Collar Period: 24 hrs a day Secretion Description: not visualized Level of Secretion Expectoration: Oral    Vent Dependency  FiO2 (%): 21 %    Cuff Deflation Trial  GO    Harlon Ditty, MA CCC-SLP [phone]      Akram Kissick,  Riley Nearing 06/08/2012, 4:37 PM

## 2012-06-08 NOTE — Progress Notes (Addendum)
Physical Therapy Treatment Patient Details Name: [name] MRN: [id number] DOB: 1940/06/10 Today's Date: 06/08/2012 Time: 1011-1029 PT Time Calculation (min): 18 min  PT Assessment / Plan / Recommendation Comments on Treatment Session  Patient limited with participation, needed max encouragement to participate.  Seemed less short of breath with ambulation in room today than last session.  Still safety risk due to poor awareness of safety with hand placement during transfers.    Follow Up Recommendations  Home health PT;Supervision/Assistance - 24 hour           Equipment Recommendations  Rolling walker with 5" wheels       Frequency Min 3X/week   Plan Discharge plan remains appropriate    Precautions / Restrictions Precautions Precautions: Fall Precaution Comments: Trach collar   Pertinent Vitals/Pain Denies pain VSS    Mobility  Bed Mobility Bed Mobility: Supine to Sit;Sitting - Scoot to Edge of Bed;Sit to Supine Supine to Sit: 5: Supervision;HOB elevated;With rails Sitting - Scoot to Edge of Bed: 5: Supervision Sit to Supine: 5: Supervision;HOB elevated Details for Bed Mobility Assistance: slow to move, but only assist for lines Transfers Sit to Stand: 5: Supervision;With upper extremity assist;Other (comment) (with RW) Stand to Sit: 5: Supervision;With upper extremity assist;Other (comment) (with RW) Details for Transfer Assistance: poor hand placemend pulling on RW Ambulation/Gait Ambulation/Gait Assistance: 4: Min guard Ambulation Distance (Feet): 20 Feet Assistive device: Rolling walker Ambulation/Gait Assistance Details: to window to see out at flowers, assist for safety due to gown too long Gait Pattern: Step-through pattern;Shuffle;Trunk rotated posteriorly on right;Trunk flexed     PT Goals Acute Rehab PT Goals Pt will go Supine/Side to Sit: with supervision PT Goal: Supine/Side to Sit - Progress: Met Pt will go Sit to Supine/Side: with  supervision PT Goal: Sit to Supine/Side - Progress: Met Pt will go Sit to Stand: with supervision PT Goal: Sit to Stand - Progress: Progressing toward goal Pt will go Stand to Sit: with supervision PT Goal: Stand to Sit - Progress: Progressing toward goal Pt will Stand: with supervision;with unilateral upper extremity support;3 - 5 min PT Goal: Stand - Progress: Progressing toward goal Pt will Ambulate: 51 - 150 feet;with least restrictive assistive device;with supervision PT Goal: Ambulate - Progress: Progressing toward goal  Visit Information  Last PT Received On: 06/08/12 Assistance Needed: +1    Subjective Data  Subjective: I don't want to stay up long   Cognition  Cognition Arousal/Alertness: Awake/alert Behavior During Therapy: WFL for tasks assessed/performed Overall Cognitive Status: Within Functional Limits for tasks assessed Difficult to assess due to: Tracheostomy (PMV)    Balance  Static Standing Balance Static Standing - Balance Support: Bilateral upper extremity supported;During functional activity Static Standing - Level of Assistance: 5: Stand by assistance Static Standing - Comment/# of Minutes: static standing to look out window  End of Session PT - End of Session Equipment Utilized During Treatment: Oxygen Activity Tolerance: Patient tolerated treatment well Patient left: in bed;with call bell/phone within reach   GP     [facility] 06/08/2012, 4:43 PM Sheran Lawless, PT [phone] 06/08/2012

## 2012-06-09 LAB — BASIC METABOLIC PANEL
BUN: 29 mg/dL — ABNORMAL HIGH (ref 6–23)
Chloride: 102 mEq/L (ref 96–112)
GFR calc Af Amer: 19 mL/min — ABNORMAL LOW (ref >90)
GFR calc non Af Amer: 16 mL/min — ABNORMAL LOW (ref >90)
Potassium: 4.2 mEq/L (ref 3.5–5.1)

## 2012-06-09 LAB — GLUCOSE, CAPILLARY: Glucose-Capillary: 88 mg/dL (ref 70–99)

## 2012-06-09 MED ORDER — TORSEMIDE 20 MG PO TABS
20.0000 mg | ORAL_TABLET | Freq: Every day | ORAL | Status: DC
Start: 2012-06-10 — End: 2012-06-10
  Administered 2012-06-10: 20 mg via ORAL
  Filled 2012-06-09: qty 1

## 2012-06-09 MED ORDER — LOPERAMIDE HCL 2 MG PO CAPS
2.0000 mg | ORAL_CAPSULE | ORAL | Status: DC | PRN
  Administered 2012-06-09 – 2012-06-10 (×2): 2 mg via ORAL
  Filled 2012-06-09: qty 1
  Filled 2012-06-09: qty 2
  Filled 2012-06-09: qty 1

## 2012-06-09 MED ORDER — ALBUTEROL SULFATE (5 MG/ML) 0.5% IN NEBU
2.5000 mg | INHALATION_SOLUTION | Freq: Four times a day (QID) | RESPIRATORY_TRACT | Status: DC
  Administered 2012-06-09 – 2012-06-11 (×7): 2.5 mg via RESPIRATORY_TRACT
  Filled 2012-06-09 (×7): qty 0.5

## 2012-06-09 NOTE — Progress Notes (Signed)
Physical Therapy Treatment Patient Details Name: [name] MRN: [id number] DOB: December 05, 1940 Today's Date: 06/09/2012 Time: 4098-1191 PT Time Calculation (min): 24 min  PT Assessment / Plan / Recommendation Comments on Treatment Session  Patient limited with participation, needed max encouragement to participate. Agreeable to exercises only with maximal encouragement.    Follow Up Recommendations  Home health PT;Supervision/Assistance - 24 hour           Equipment Recommendations  Rolling walker with 5" wheels       Frequency Min 3X/week   Plan Discharge plan remains appropriate;Frequency remains appropriate    Precautions / Restrictions Precautions Precautions: Fall Precaution Comments: Trach collar with PMV       Mobility  Bed Mobility Supine to Sit: 5: Supervision;With rails;HOB elevated Sitting - Scoot to Edge of Bed: 5: Supervision Sit to Supine: 5: Supervision;HOB flat;With rail Scooting to [facility]: 5: Supervision;With rail Details for Bed Mobility Assistance: incr time needed with movement, assist to manage lines only    Exercises General Exercises - Lower Extremity Ankle Circles/Pumps: AROM;Both;10 reps;Supine Quad Sets: AROM;Strengthening;Both;10 reps;Supine Long Arc Quad: AROM;Strengthening;Both;10 reps;Seated Heel Slides: AROM;Strengthening;Both;10 reps;Supine;Limitations Heel Slides Limitations: manual resistance to extension Straight Leg Raises: AAROM;Strengthening;Both;5 reps;Supine     PT Goals Acute Rehab PT Goals Pt will go Supine/Side to Sit: with supervision PT Goal: Supine/Side to Sit - Progress: Met Pt will go Sit to Supine/Side: with supervision PT Goal: Sit to Supine/Side - Progress: Met Pt will Transfer Bed to Chair/Chair to Bed: with supervision PT Transfer Goal: Bed to Chair/Chair to Bed - Progress: Not progressing Pt will Stand: with supervision;with unilateral upper extremity support;3 - 5 min PT Goal: Stand - Progress: Not  progressing  Visit Information  Last PT Received On: 06/09/12 Assistance Needed: +1    Subjective Data  Subjective: " I don't feel like getting out of bed today, I did that yesterday"   Cognition  Cognition Arousal/Alertness: Awake/alert Behavior During Therapy: WFL for tasks assessed/performed Overall Cognitive Status: Within Functional Limits for tasks assessed Difficult to assess due to: Tracheostomy       End of Session PT - End of Session Equipment Utilized During Treatment: Oxygen Activity Tolerance: Patient tolerated treatment well Patient left: in bed;with call bell/phone within reach   GP     Sallyanne Kuster 06/09/2012, 12:07 PM  Sallyanne Kuster, PTA Office- [phone]

## 2012-06-09 NOTE — Progress Notes (Addendum)
[facility] PROGRESS NOTE  [name] ZOX:[id number] DOB: 05-22-1940 DOA: 06/04/2012 PCP: Gwynneth Aliment, MD  Assessment/Plan: 1. Acute on chronic respiratory failure due to mucous plugging , HCAP, acute on chronic diastolic CHF, cor pulmonale : - Change IV antibiotics to oral. Received 4 days IV vancomycin and Zosyn. Day 6 antibiotics.  Levaquin started 4-17. Still with moderate amount of secretion.  Sporadic wheezes, will add albuterol.  2. Acute on Chronic systolic and diastolic heart failure: started lasix iv 80 mg every 8 hours - good response - decreased dose to 80 mg iv BID on 4/15. resume Bumex 4-16. Will decrease Bumex to avoid worsening renal function in setting of diarrhea.  3. CKD stage 4: baseline creat is 2.6. Creatinine up to 2.8 after diuresis - continue to monitor.  Repeat renal function in am. Monitor on Bumex. Cr today at 2.7, decrease Bumex due to diarrhea.  4. Anemia of chronic disease. Stable. No signs of bleeding  5.Hypokalemia: 40 meq PO daily. Resolved.  6-Diarrhea: no BM this morning. C diff negative. Persistence diarrhea. Will give imodium. I asked nurse to monitor for diarrhea.    Code Status: full  Family Communication: no family present  Disposition Plan: home  Soon.    Consultants:  none  Procedures:  none  Antibiotics: Vanc and Zosyn 4/13 ----4-16 Levaquin 4-16   HPI/Subjective: Not feeling well. Was not able to sleep last night. Still with secretions.  Diarrhea persist.   Objective: Filed Vitals:   06/09/12 0408 06/09/12 0502 06/09/12 0823 06/09/12 0840  BP: 164/73   151/75  Pulse: 80 61 76 79  Temp:      TempSrc:      Resp: 22 18 18    Height:      Weight:      SpO2: 95% 96% 100%     Intake/Output Summary (Last 24 hours) at 06/09/12 0936 Last data filed at 06/08/12 2116  Gross per 24 hour  Intake    250 ml  Output    100 ml  Net    150 ml   Filed Weights   06/04/12 1430 06/04/12 1500 06/08/12 0411  Weight:  76.4 kg (168 lb 6.9 oz) 76.4 kg (168 lb 6.9 oz) 75.2 kg (165 lb 12.6 oz)    Exam:   General:  No distress.   Cardiovascular: S 1, S 2 RRR  Respiratory: few ronchus  Abdomen:Bs present, soft, NT  Musculoskeletal: trace edema.   Data Reviewed: Basic Metabolic Panel:  Recent Labs Lab 06/05/12 0655 06/06/12 0550 06/07/12 0555 06/08/12 0321 06/09/12 0608  NA 142 143 143 140 141  K 3.5 3.2* 3.4* 3.3* 4.2  CL 105 103 103 102 102  CO2 30 30 32 32 32  GLUCOSE 158* 94 98 96 98  BUN 36* 37* 35* 32* 29*  CREATININE 2.55* 2.68* 2.82* 2.73* 2.71*  CALCIUM 9.0 9.2 9.2 9.3 9.3   Liver Function Tests: No results found for this basename: AST, ALT, ALKPHOS, BILITOT, PROT, ALBUMIN,  in the last 168 hours No results found for this basename: LIPASE, AMYLASE,  in the last 168 hours No results found for this basename: AMMONIA,  in the last 168 hours CBC:  Recent Labs Lab 06/04/12 0932 06/05/12 0655 06/07/12 0555 06/08/12 0321  WBC 10.0 7.5 7.5 7.0  NEUTROABS 7.1  --   --   --   HGB 11.9* 9.1* 9.6* 9.6*  HCT 39.4 29.6* 31.1* 30.5*  MCV 83.7 81.3 79.5 78.6  PLT 139* 113* 145*  163   Cardiac Enzymes:  Recent Labs Lab 06/04/12 0932  TROPONINI <0.30   BNP (last 3 results)  Recent Labs  04/28/12 1611 05/15/12 0741 06/04/12 0932  PROBNP 3133.0* 1451.0* 8639.0*   CBG:  Recent Labs Lab 06/04/12 1616 06/04/12 2148 06/08/12 1635 06/08/12 2113  GLUCAP 91 164* 89 81    Recent Results (from the past 240 hour(s))  CULTURE, BLOOD (ROUTINE X 2)     Status: None   Collection Time    06/04/12 12:38 PM      Result Value Range Status   Specimen Description BLOOD LEFT ARM   Final   Special Requests BOTTLES DRAWN AEROBIC AND ANAEROBIC 10CC   Final   Culture  Setup Time 06/04/2012 17:37   Final   Culture     Final   Value:        BLOOD CULTURE RECEIVED NO GROWTH TO DATE CULTURE WILL BE HELD FOR 5 DAYS BEFORE ISSUING A FINAL NEGATIVE REPORT   Report Status PENDING    Incomplete  CULTURE, BLOOD (ROUTINE X 2)     Status: None   Collection Time    06/04/12 12:45 PM      Result Value Range Status   Specimen Description BLOOD LEFT ARM   Final   Special Requests BOTTLES DRAWN AEROBIC AND ANAEROBIC 10CC   Final   Culture  Setup Time 06/04/2012 17:37   Final   Culture     Final   Value:        BLOOD CULTURE RECEIVED NO GROWTH TO DATE CULTURE WILL BE HELD FOR 5 DAYS BEFORE ISSUING A FINAL NEGATIVE REPORT   Report Status PENDING   Incomplete  MRSA PCR SCREENING     Status: None   Collection Time    06/04/12  3:35 PM      Result Value Range Status   MRSA by PCR NEGATIVE  NEGATIVE Final   Comment:            The GeneXpert MRSA Assay (FDA     approved for NASAL specimens     only), is one component of a     comprehensive MRSA colonization     surveillance program. It is not     intended to diagnose MRSA     infection nor to guide or     monitor treatment for     MRSA infections.  CULTURE, RESPIRATORY (NON-EXPECTORATED)     Status: None   Collection Time    06/04/12  4:26 PM      Result Value Range Status   Specimen Description TRACHEAL ASPIRATE   Final   Special Requests NONE   Final   Gram Stain     Final   Value: ABUNDANT WBC PRESENT, PREDOMINANTLY PMN     RARE SQUAMOUS EPITHELIAL CELLS PRESENT     RARE GRAM POSITIVE COCCI IN PAIRS     RARE GRAM NEGATIVE RODS   Culture Non-Pathogenic Oropharyngeal-type Flora Isolated.   Final   Report Status 06/06/2012 FINAL   Final  CLOSTRIDIUM DIFFICILE BY PCR     Status: None   Collection Time    06/07/12  8:13 PM      Result Value Range Status   C difficile by pcr NEGATIVE  NEGATIVE Final     Studies: No results found.  Scheduled Meds: . amLODipine  10 mg Oral Daily  . aspirin  81 mg Oral Daily  . busPIRone  5 mg Oral BID  . carvedilol  6.25 mg Oral BID WC  . enoxaparin (LOVENOX) injection  30 mg Subcutaneous Q24H  . hydrALAZINE  100 mg Oral TID  . isosorbide mononitrate  30 mg Oral Daily  .  levofloxacin  250 mg Oral Daily  . metoCLOPramide  5 mg Oral QID  . pantoprazole  20 mg Oral QPM  . potassium chloride  40 mEq Oral Daily  . sertraline  100 mg Oral QHS  . simvastatin  10 mg Oral q1800  . sodium chloride  3 mL Intravenous Q12H  . [START ON 06/10/2012] torsemide  20 mg Oral Daily   Continuous Infusions:   Principal Problem:   Acute-on-chronic respiratory failure Active Problems:   Type 2 diabetes mellitus with vascular disease   Hyperlipdemia   Obesity (BMI 30-39.9)   Sleep apnea   Hypertensive heart disease with congestive heart failure   CORONARY ARTERY DISEASE   Chronic cor pulmonale   C O P D   Chronic kidney disease (CKD), stage IV (severe)   Bifascicular block   Anemia   Acute on chronic systolic heart failure   Cirrhosis of liver   Tracheostomy complication   Cardiac arrest   HCAP (healthcare-associated pneumonia)   Tracheostomy status    Time spent: 25 minutes.     Shelbylynn Walczyk  [facility] Pager [phone]. If 7PM-7AM, please contact night-coverage at [email or web address], password [facility] 06/09/2012, 9:36 AM  LOS: 5 days

## 2012-06-10 DIAGNOSIS — J9610 Chronic respiratory failure, unspecified whether with hypoxia or hypercapnia: Secondary | ICD-10-CM

## 2012-06-10 LAB — CULTURE, BLOOD (ROUTINE X 2): Culture: NO GROWTH

## 2012-06-10 LAB — BASIC METABOLIC PANEL
BUN: 29 mg/dL — ABNORMAL HIGH (ref 6–23)
CO2: 32 mEq/L (ref 19–32)
Chloride: 103 mEq/L (ref 96–112)
GFR calc Af Amer: 19 mL/min — ABNORMAL LOW (ref >90)
Potassium: 4.3 mEq/L (ref 3.5–5.1)

## 2012-06-10 MED ORDER — TORSEMIDE 20 MG PO TABS
20.0000 mg | ORAL_TABLET | Freq: Two times a day (BID) | ORAL | Status: DC
  Administered 2012-06-10 – 2012-06-12 (×4): 20 mg via ORAL
  Filled 2012-06-10 (×6): qty 1

## 2012-06-10 NOTE — Progress Notes (Signed)
Pt refuses IV restart. MD made aware. Will continue to monitor.

## 2012-06-10 NOTE — Progress Notes (Signed)
[facility] PROGRESS NOTE  [name]:[id number] DOB: November 19, 1940 DOA: 06/04/2012 PCP: Gwynneth Aliment, MD  Assessment/Plan: 1. Acute on chronic respiratory failure due to mucous plugging , HCAP, acute on chronic diastolic CHF, cor pulmonale : - Change IV antibiotics to oral. Received 4 days IV vancomycin and Zosyn. Day 6 antibiotics.  Levaquin started 4-17. Less secretion. Nebulizer treatments helping.  2. Acute on Chronic systolic and diastolic heart failure: started lasix iv 80 mg every 8 hours - good response - decreased dose to 80 mg iv BID on 4/15. resume Bumex 4-16.  3. CKD stage 4: baseline creat is 2.6. Creatinine up to 2.8 after diuresis - continue to monitor. Monitor on Bumex. Cr today at 2.6 4. Anemia of chronic disease. Stable. No signs of bleeding  5.Hypokalemia: resolved.  6-Diarrhea: C diff negative. Patient still complaining of diarrhea. Will monitor.    Code Status: full  Family Communication: no family present  Disposition Plan: home    Consultants:  none  Procedures:  none  Antibiotics: Vanc and Zosyn 4/13 ----4-16 Levaquin 4-16   HPI/Subjective: Still with diarrhea. Per nurse only had one BM last night.  I asked to the nurse  staff to document BM. Patient denies worsening SOB. She feels she is not ready to go home.   Objective: Filed Vitals:   06/10/12 0416 06/10/12 0442 06/10/12 0808 06/10/12 1017  BP: 172/67 139/54  152/68  Pulse: 68 63 80   Temp: 98.8 F (37.1 C)     TempSrc: Oral     Resp: 20  18   Height:      Weight:      SpO2: 99%  96%     Intake/Output Summary (Last 24 hours) at 06/10/12 1100 Last data filed at 06/10/12 0500  Gross per 24 hour  Intake    240 ml  Output    500 ml  Net   -260 ml   Filed Weights   06/04/12 1430 06/04/12 1500 06/08/12 0411  Weight: 76.4 kg (168 lb 6.9 oz) 76.4 kg (168 lb 6.9 oz) 75.2 kg (165 lb 12.6 oz)    Exam:   General:  No distress.   Cardiovascular: S 1, S 2  RRR  Respiratory: few ronchus  Abdomen:Bs present, soft, NT  Musculoskeletal: trace edema.   Data Reviewed: Basic Metabolic Panel:  Recent Labs Lab 06/06/12 0550 06/07/12 0555 06/08/12 0321 06/09/12 4401 06/10/12 0620  NA 143 143 140 141 141  K 3.2* 3.4* 3.3* 4.2 4.3  CL 103 103 102 102 103  CO2 30 32 32 32 32  GLUCOSE 94 98 96 98 93  BUN 37* 35* 32* 29* 29*  CREATININE 2.68* 2.82* 2.73* 2.71* 2.67*  CALCIUM 9.2 9.2 9.3 9.3 9.0   Liver Function Tests: No results found for this basename: AST, ALT, ALKPHOS, BILITOT, PROT, ALBUMIN,  in the last 168 hours No results found for this basename: LIPASE, AMYLASE,  in the last 168 hours No results found for this basename: AMMONIA,  in the last 168 hours CBC:  Recent Labs Lab 06/04/12 0932 06/05/12 0655 06/07/12 0555 06/08/12 0321  WBC 10.0 7.5 7.5 7.0  NEUTROABS 7.1  --   --   --   HGB 11.9* 9.1* 9.6* 9.6*  HCT 39.4 29.6* 31.1* 30.5*  MCV 83.7 81.3 79.5 78.6  PLT 139* 113* 145* 163   Cardiac Enzymes:  Recent Labs Lab 06/04/12 0932  TROPONINI <0.30   BNP (last 3 results)  Recent Labs  04/28/12  1611 05/15/12 0741 06/04/12 0932  PROBNP 3133.0* 1451.0* 8639.0*   CBG:  Recent Labs Lab 06/08/12 1635 06/08/12 2113 06/09/12 1126 06/09/12 1656 06/09/12 2109  GLUCAP 89 81 92 114* 88    Recent Results (from the past 240 hour(s))  CULTURE, BLOOD (ROUTINE X 2)     Status: None   Collection Time    06/04/12 12:38 PM      Result Value Range Status   Specimen Description BLOOD LEFT ARM   Final   Special Requests BOTTLES DRAWN AEROBIC AND ANAEROBIC 10CC   Final   Culture  Setup Time 06/04/2012 17:37   Final   Culture     Final   Value:        BLOOD CULTURE RECEIVED NO GROWTH TO DATE CULTURE WILL BE HELD FOR 5 DAYS BEFORE ISSUING A FINAL NEGATIVE REPORT   Report Status PENDING   Incomplete  CULTURE, BLOOD (ROUTINE X 2)     Status: None   Collection Time    06/04/12 12:45 PM      Result Value Range Status    Specimen Description BLOOD LEFT ARM   Final   Special Requests BOTTLES DRAWN AEROBIC AND ANAEROBIC 10CC   Final   Culture  Setup Time 06/04/2012 17:37   Final   Culture     Final   Value:        BLOOD CULTURE RECEIVED NO GROWTH TO DATE CULTURE WILL BE HELD FOR 5 DAYS BEFORE ISSUING A FINAL NEGATIVE REPORT   Report Status PENDING   Incomplete  MRSA PCR SCREENING     Status: None   Collection Time    06/04/12  3:35 PM      Result Value Range Status   MRSA by PCR NEGATIVE  NEGATIVE Final   Comment:            The GeneXpert MRSA Assay (FDA     approved for NASAL specimens     only), is one component of a     comprehensive MRSA colonization     surveillance program. It is not     intended to diagnose MRSA     infection nor to guide or     monitor treatment for     MRSA infections.  CULTURE, RESPIRATORY (NON-EXPECTORATED)     Status: None   Collection Time    06/04/12  4:26 PM      Result Value Range Status   Specimen Description TRACHEAL ASPIRATE   Final   Special Requests NONE   Final   Gram Stain     Final   Value: ABUNDANT WBC PRESENT, PREDOMINANTLY PMN     RARE SQUAMOUS EPITHELIAL CELLS PRESENT     RARE GRAM POSITIVE COCCI IN PAIRS     RARE GRAM NEGATIVE RODS   Culture Non-Pathogenic Oropharyngeal-type Flora Isolated.   Final   Report Status 06/06/2012 FINAL   Final  CLOSTRIDIUM DIFFICILE BY PCR     Status: None   Collection Time    06/07/12  8:13 PM      Result Value Range Status   C difficile by pcr NEGATIVE  NEGATIVE Final     Studies: No results found.  Scheduled Meds: . albuterol  2.5 mg Nebulization Q6H  . amLODipine  10 mg Oral Daily  . aspirin  81 mg Oral Daily  . busPIRone  5 mg Oral BID  . carvedilol  6.25 mg Oral BID WC  . enoxaparin (LOVENOX) injection  30 mg Subcutaneous  Q24H  . hydrALAZINE  100 mg Oral TID  . isosorbide mononitrate  30 mg Oral Daily  . levofloxacin  250 mg Oral Daily  . metoCLOPramide  5 mg Oral QID  . pantoprazole  20 mg Oral QPM   . potassium chloride  40 mEq Oral Daily  . sertraline  100 mg Oral QHS  . simvastatin  10 mg Oral q1800  . sodium chloride  3 mL Intravenous Q12H  . torsemide  20 mg Oral BID   Continuous Infusions:   Principal Problem:   Acute-on-chronic respiratory failure Active Problems:   Type 2 diabetes mellitus with vascular disease   Hyperlipdemia   Obesity (BMI 30-39.9)   Sleep apnea   Hypertensive heart disease with congestive heart failure   CORONARY ARTERY DISEASE   Chronic cor pulmonale   C O P D   Chronic kidney disease (CKD), stage IV (severe)   Bifascicular block   Anemia   Acute on chronic systolic heart failure   Cirrhosis of liver   Tracheostomy complication   Cardiac arrest   HCAP (healthcare-associated pneumonia)   Tracheostomy status    Time spent: 25 minutes.     REGALADO,BELKYS  [facility] Pager [phone]. If 7PM-7AM, please contact night-coverage at [email or web address], password [facility] 06/10/2012, 11:00 AM  LOS: 6 days

## 2012-06-10 NOTE — Progress Notes (Signed)
Pt had one loose BM this am, Imodium given. MD aware, Will continue to monitor.

## 2012-06-10 NOTE — Progress Notes (Signed)
Pt had another loose BM, will continue to monitor.

## 2012-06-11 LAB — BASIC METABOLIC PANEL
CO2: 32 mEq/L (ref 19–32)
GFR calc non Af Amer: 17 mL/min — ABNORMAL LOW (ref >90)
Glucose, Bld: 92 mg/dL (ref 70–99)
Potassium: 4.4 mEq/L (ref 3.5–5.1)
Sodium: 138 mEq/L (ref 135–145)

## 2012-06-11 LAB — TROPONIN I: Troponin I: <0.3 ng/mL (ref <0.30)

## 2012-06-11 LAB — MAGNESIUM: Magnesium: 2 mg/dL (ref 1.5–2.5)

## 2012-06-11 MED ORDER — CARVEDILOL 12.5 MG PO TABS
12.5000 mg | ORAL_TABLET | Freq: Two times a day (BID) | ORAL | Status: DC
  Administered 2012-06-11: 12.5 mg via ORAL
  Filled 2012-06-11 (×4): qty 1

## 2012-06-11 MED ORDER — ALBUTEROL SULFATE (5 MG/ML) 0.5% IN NEBU
2.5000 mg | INHALATION_SOLUTION | Freq: Three times a day (TID) | RESPIRATORY_TRACT | Status: DC
  Administered 2012-06-11 – 2012-06-12 (×4): 2.5 mg via RESPIRATORY_TRACT
  Filled 2012-06-11 (×4): qty 0.5

## 2012-06-11 MED ORDER — LEVOFLOXACIN 250 MG PO TABS: 250.0000 mg | ORAL_TABLET | Freq: Every day | ORAL | Status: DC

## 2012-06-11 MED ORDER — ALBUTEROL SULFATE (5 MG/ML) 0.5% IN NEBU: 2.5000 mg | INHALATION_SOLUTION | RESPIRATORY_TRACT | Status: DC | PRN

## 2012-06-11 NOTE — Care Management Note (Signed)
Received call from patient's nurse stating patient will not be discharged today due to having a run of vtach. Made Jamie aware with [facility] that patient's discharge is on hold for today. Patient's walker will still be delivered to patient's home tomorrow 06/12/12 per Asher Muir with [facility]. ATIKAHALL,RNCM

## 2012-06-11 NOTE — Care Management Note (Signed)
CARE MANAGEMENT NOTE 06/11/2012  Patient:  ARTHELLA, HEADINGS   Account Number:  [id number]  Date Initiated:  06/05/2012  Documentation initiated by:  Donn Pierini  Subjective/Objective Assessment:   Pt admitted with resp. failure- hx of trach     Action/Plan:   PTA pt lived at home with [facility] services- active with [facility] for HH-RN- and daughter was caregiver   Anticipated DC Date:  06/09/2012   Anticipated DC Plan:  HOME W HOME HEALTH SERVICES  In-house referral  Clinical Social Worker      DC Planning Services  CM consult      [facility] Choice  Resumption Of Svcs/PTA Provider   Choice offered to / List presented to:  C-1 Patient   DME arranged  WALKER - ROLLING        HH arranged  HH-1 RN  HH-2 PT  HH-3 OT  HH-6 SOCIAL WORKER      HH agency  [facility]   Status of service:  Completed, signed off Medicare Important Message given?   (If response is "NO", the following Medicare IM given date fields will be blank) Date Medicare IM given:   Date Additional Medicare IM given:    Discharge Disposition:  HOME W HOME HEALTH SERVICES  Per UR Regulation:  Reviewed for med. necessity/level of care/duration of stay  If discussed at Long Length of Stay Meetings, dates discussed:    Comments:  06/11/12 Reed Breech Received call from patient's nurse stating patient to be discharged home today with home health care services. HHC has already been previously arranged by weekday [facility] with [facility]. Noted additional disciplines added and resumption of care orders faxed with confirmation to [facility]. Also made [facility] intake aware that respiratory was added. Informed that respiratory care is not a discipline offered by [facility]. Made mention and asked that [facility] RN be extra diligent with respiratory care and teaching of patient's trach as patient's nurse relayed to this writer that patient had a mucus plug upon admission. This information was placed in notes for [facility]. Patient  will need transportation home via ambulance. LCSW will be notified per patient's nurse.Walker to be shipped to patient's home since riding home on ambulance by tomorrow 06/12/12 per Asher Muir with [facility]. Made nursing aware of this. Nebullizer was delivered to patient on 04/24/12 per [facility] .No further needs assessed. Ketty Bitton,RNCM [phone]  06/09/12 JULIE AMERSON,RN,BSN [phone] PT FOR LIKELY DC WITHIN NEXT 24HRS.  NOTIFIED [facility] OF DC DATE.  REFERRAL TO [facility] FOR DME NEEDS.  PT STATES SHE WILL DC HOME WITH DAUGHTER AND HH SERVICES AS PRIOR TO ADMISSION.  STATES DAUGHTER WORKS "SOME", BUT CURRENTLY HOME ALL THE TIME.  06/06/12- 1120- Donn Pierini RN, BSN [phone] Spoke with pt at bedside- per conversation pt agreeable to resuming HH with [facility]- states that she also has a niece that is going to come help some at home - plan is to go to daughter's home for now- orders to resume HH in chart- will add a CSW although at this time daughter does not want mom to go to SNF- it may be more than they can handle at home with both pt and daughter being in hospital recently and both being following by [facility]- call made to Lupita Leash with [facility] regarding resumption of HH at discharge. HH will resume within 24-48 post discharge. Pt did state she could use a RW for home- MD would you please place order for DME.-   06/05/12- 1100-  Donn Pierini Charity fundraiser, BSN [phone] Pt admitted with Trach plugging- pt from home with daughter as caregiver- however daughter is currently also in hospital as a pt and at this time unable to be the caregiver for mom. PT/OT evals pending. NCM to follow for d/c needs. Pt is active with [facility] for HH-RN

## 2012-06-11 NOTE — Progress Notes (Signed)
Pt had one formed small BM. Will continue to monitor.

## 2012-06-11 NOTE — Progress Notes (Signed)
Pt had 15 beat run of VTach. Pt asymptomatic. VSS. MD aware. Will continue to monitor.

## 2012-06-11 NOTE — Discharge Summary (Addendum)
Physician Discharge Summary  [name] ZOX:[id number] DOB: November 28, 1940 DOA: 06/04/2012  PCP: Gwynneth Aliment, MD  Admit date: 06/04/2012 Discharge date: 06/11/2012  Time spent: 35 minutes  Recommendations for Outpatient Follow-up:  Need Bmet to follow renal function.   Discharge Diagnoses:    Acute-on-chronic respiratory failure   HCAP (healthcare-associated pneumonia)   Type 2 diabetes mellitus with vascular disease   Hyperlipdemia   Obesity (BMI 30-39.9)   Sleep apnea   Hypertensive heart disease with congestive heart failure   CORONARY ARTERY DISEASE   Chronic cor pulmonale   C O P D   Chronic kidney disease (CKD), stage IV (severe)   Bifascicular block   Anemia   Acute on chronic systolic heart failure   Cirrhosis of liver   Tracheostomy complication   Cardiac arrest   Tracheostomy status   Discharge Condition: Stable.   Diet recommendation: Heart Healthy  Filed Weights   06/04/12 1430 06/04/12 1500 06/08/12 0411  Weight: 76.4 kg (168 lb 6.9 oz) 76.4 kg (168 lb 6.9 oz) 75.2 kg (165 lb 12.6 oz)    History of present illness:  is a 72 y.o. female witH h/o cardio pulmonary arrest in 01/2012 , s/p trach, s/p recent admission for acute on chronic respirator failure and discharged on 3/28 was brought in today for respiratory distress by EMS. On arrival to the ED a large mucous plug was suctioned from her trach and she seems more comfortable. 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 Patient cannot talk at the moment, no family is in the room and no family is answering my calls. These are the contacts and numbers I have tried calling [facility] Daughter [phone] [facility] Daughter [phone]   Hospital Course:  1. Acute on chronic respiratory failure due to mucous plugging , HCAP, acute on chronic diastolic CHF, cor pulmonale : - Received 4 days IV vancomycin and Zosyn. Day 7 antibiotics.  Levaquin started 4-17. Less secretion. Nebulizer treatments helping. Stable to be discharge. 2. Acute on Chronic systolic and diastolic heart failure:  started lasix iv 80 mg every 8 hours - good response - decreased dose to 80 mg iv BID on 4/15.  resume Bumex 4-16. Compensated.  3. CKD stage 4: baseline creat is 2.6. Creatinine up to 2.8 after diuresis - continue to monitor. Monitor on Bumex. Cr at 2.6 Need Bmet to follow renal function. 4. Anemia of chronic disease. Stable. No signs of bleeding  5.Hypokalemia: resolved.  6-Diarrhea: C diff negative. No Diarrhea reported by staff. Patient only had 2 BM yesterday.    Procedures:  None  Consultations:  None  Discharge Exam: Filed Vitals:   06/11/12 0312 06/11/12 0423 06/11/12 0804 06/11/12 0805  BP:  173/59    Pulse: 68 71  77  Temp:  98.9 F (37.2 C)    TempSrc:  Oral    Resp: 18 19  18   Height:      Weight:      SpO2: 100% 100% 100% 100%    General: No distress.  Cardiovascular: S 1, S 2 RRR Respiratory: Few ronchus.   Discharge Instructions  Discharge Orders   Future Orders Complete By Expires     Diet - low sodium heart healthy  As directed     Increase activity slowly  As directed         Medication List    STOP taking these medications       sennosides-docusate  sodium 8.6-50 MG tablet  Commonly known as:  SENOKOT-S      TAKE these medications       albuterol (5 MG/ML) 0.5% nebulizer solution  Commonly known as:  PROVENTIL  Take 0.5 mLs (2.5 mg total) by nebulization every 4 (four) hours as needed for wheezing.     amLODipine 10 MG tablet  Commonly known as:  NORVASC  Take 10 mg by mouth daily.     aspirin 81 MG chewable tablet  Chew 1 tablet (81 mg total) by mouth daily.     busPIRone 5 MG tablet  Commonly known as:  BUSPAR  Take 5 mg by mouth 2 (two) times daily.     carvedilol 6.25 MG tablet  Commonly known as:  COREG  Take 1 tablet (6.25 mg total) by mouth 2 (two) times daily with a  meal.     cloNIDine 0.1 MG tablet  Commonly known as:  CATAPRES  Take 0.1 mg by mouth 2 (two) times daily.     ferrous sulfate 325 (65 FE) MG EC tablet  Take 325 mg by mouth 2 (two) times daily.     hydrALAZINE 100 MG tablet  Commonly known as:  APRESOLINE  Take 100 mg by mouth 3 (three) times daily.     isosorbide mononitrate 30 MG 24 hr tablet  Commonly known as:  IMDUR  Take 1 tablet (30 mg total) by mouth daily.     levofloxacin 250 MG tablet  Commonly known as:  LEVAQUIN  Take 1 tablet (250 mg total) by mouth daily.     metoCLOPramide 5 MG tablet  Commonly known as:  REGLAN  Take 5 mg by mouth 4 (four) times daily.     oxycodone 5 MG capsule  Commonly known as:  OXY-IR  Take 5 mg by mouth every 6 (six) hours as needed (for moderate pain).     pantoprazole 20 MG tablet  Commonly known as:  PROTONIX  Take 20 mg by mouth every evening.     pravastatin 40 MG tablet  Commonly known as:  PRAVACHOL  Take 40 mg by mouth daily.     sertraline 100 MG tablet  Commonly known as:  ZOLOFT  Take 100 mg by mouth at bedtime.     torsemide 20 MG tablet  Commonly known as:  DEMADEX  Take 20 mg by mouth 2 (two) times daily.           Follow-up Information   Follow up with Gwynneth Aliment, MD In 1 week.   Contact information:   [address] [place] [address] [phone]        The results of significant diagnostics from this hospitalization (including imaging, microbiology, ancillary and laboratory) are listed below for reference.    Significant Diagnostic Studies: Dg Chest Port 1 View  06/04/2012  *RADIOLOGY REPORT*  Clinical Data: Shortness of breath.  PORTABLE CHEST - 1 VIEW 06/05/2011 1153 hours:  Comparison: Portable chest x-ray 05/18/2012, 05/15/2012 and two- view chest x-ray 04/28/1012.  Findings: Dense consolidation in the left lower lobe and lingula, progressive since the 05/18/2012 examination.  Right lung remains essentially clear.  Cardiac  silhouette enlarged but stable.  The patient's chin obscures the left apex.  Tracheostomy tube tip below the thoracic inlet.  IMPRESSION: Worsening dense atelectasis and/or pneumonia in the left lower lobe and lingula since 03/ 27/2014.   Original Report Authenticated By: Hulan Saas, M.D.    Dg Chest Port 1 View  05/18/2012  *  RADIOLOGY REPORT*  Clinical Data: Respiratory distress.  Congestion.  PORTABLE CHEST - 1 VIEW  Comparison: Chest 05/15/2012.  Findings: Tracheostomy tube is unchanged.  There is cardiomegaly without pulmonary edema.  Elevation of the left hemidiaphragm and left basilar airspace disease appear unchanged.  Right lung is clear.  No pneumothorax.  IMPRESSION: No change in left basilar airspace disease and cardiomegaly without pulmonary edema.   Original Report Authenticated By: Holley Dexter, M.D.    Dg Chest Port 1 View  05/15/2012  *RADIOLOGY REPORT*  Clinical Data: Short of breath.  Tracheostomy.  COPD.  Check tracheostomy position.  PORTABLE CHEST - 1 VIEW  Comparison: 04/28/2012.  Findings: Chronic elevation of the left hemidiaphragm with subsegmental atelectasis.  The tracheostomy projects over the midline thoracic inlet.  The tip overlies the tracheal air column. There is no airspace disease or pleural effusion identified.  The cardiopericardial silhouette appears unchanged allowing for elevation of the left hemidiaphragm.  IMPRESSION: Tracheostomy appears in the midline over the thoracic inlet.  Tip terminates over the tracheal air column.  Chronic elevation of the left hemidiaphragm, slightly more prominent on today's exam with associated atelectasis.   Original Report Authenticated By: Andreas Newport, M.D.     Microbiology: Recent Results (from the past 240 hour(s))  CULTURE, BLOOD (ROUTINE X 2)     Status: None   Collection Time    06/04/12 12:38 PM      Result Value Range Status   Specimen Description BLOOD LEFT ARM   Final   Special Requests BOTTLES DRAWN  AEROBIC AND ANAEROBIC 10CC   Final   Culture  Setup Time 06/04/2012 17:37   Final   Culture NO GROWTH 5 DAYS   Final   Report Status 06/10/2012 FINAL   Final  CULTURE, BLOOD (ROUTINE X 2)     Status: None   Collection Time    06/04/12 12:45 PM      Result Value Range Status   Specimen Description BLOOD LEFT ARM   Final   Special Requests BOTTLES DRAWN AEROBIC AND ANAEROBIC 10CC   Final   Culture  Setup Time 06/04/2012 17:37   Final   Culture NO GROWTH 5 DAYS   Final   Report Status 06/10/2012 FINAL   Final  MRSA PCR SCREENING     Status: None   Collection Time    06/04/12  3:35 PM      Result Value Range Status   MRSA by PCR NEGATIVE  NEGATIVE Final   Comment:            The GeneXpert MRSA Assay (FDA     approved for NASAL specimens     only), is one component of a     comprehensive MRSA colonization     surveillance program. It is not     intended to diagnose MRSA     infection nor to guide or     monitor treatment for     MRSA infections.  CULTURE, RESPIRATORY (NON-EXPECTORATED)     Status: None   Collection Time    06/04/12  4:26 PM      Result Value Range Status   Specimen Description TRACHEAL ASPIRATE   Final   Special Requests NONE   Final   Gram Stain     Final   Value: ABUNDANT WBC PRESENT, PREDOMINANTLY PMN     RARE SQUAMOUS EPITHELIAL CELLS PRESENT     RARE GRAM POSITIVE COCCI IN PAIRS     RARE GRAM NEGATIVE RODS  Culture Non-Pathogenic Oropharyngeal-type Flora Isolated.   Final   Report Status 06/06/2012 FINAL   Final  CLOSTRIDIUM DIFFICILE BY PCR     Status: None   Collection Time    06/07/12  8:13 PM      Result Value Range Status   C difficile by pcr NEGATIVE  NEGATIVE Final     Labs: Basic Metabolic Panel:  Recent Labs Lab 06/06/12 0550 06/07/12 0555 06/08/12 0321 06/09/12 0608 06/10/12 0620  NA 143 143 140 141 141  K 3.2* 3.4* 3.3* 4.2 4.3  CL 103 103 102 102 103  CO2 30 32 32 32 32  GLUCOSE 94 98 96 98 93  BUN 37* 35* 32* 29* 29*   CREATININE 2.68* 2.82* 2.73* 2.71* 2.67*  CALCIUM 9.2 9.2 9.3 9.3 9.0   Liver Function Tests: No results found for this basename: AST, ALT, ALKPHOS, BILITOT, PROT, ALBUMIN,  in the last 168 hours No results found for this basename: LIPASE, AMYLASE,  in the last 168 hours No results found for this basename: AMMONIA,  in the last 168 hours CBC:  Recent Labs Lab 06/05/12 0655 06/07/12 0555 06/08/12 0321  WBC 7.5 7.5 7.0  HGB 9.1* 9.6* 9.6*  HCT 29.6* 31.1* 30.5*  MCV 81.3 79.5 78.6  PLT 113* 145* 163   Cardiac Enzymes: No results found for this basename: CKTOTAL, CKMB, CKMBINDEX, TROPONINI,  in the last 168 hours BNP: BNP (last 3 results)  Recent Labs  04/28/12 1611 05/15/12 0741 06/04/12 0932  PROBNP 3133.0* 1451.0* 8639.0*   CBG:  Recent Labs Lab 06/08/12 1635 06/08/12 2113 06/09/12 1126 06/09/12 1656 06/09/12 2109  GLUCAP 89 81 92 114* 88       Signed:  Megan Hayduk  [facility] 06/11/2012, 9:35 AM   Discharge delay due to episode of asymptomatic SVT. Patient remain stable. She received her coreg. No more episodes after that. Mg and K normal.   Stable to be discharge.

## 2012-06-12 MED ORDER — CARVEDILOL 6.25 MG PO TABS
6.2500 mg | ORAL_TABLET | Freq: Two times a day (BID) | ORAL | Status: DC
  Administered 2012-06-12: 6.25 mg via ORAL
  Filled 2012-06-12 (×3): qty 1

## 2012-06-12 NOTE — Progress Notes (Signed)
Assessment unchanged. Discussed D/C instructions with pt and daughter. Verbalized understanding. RX given to pt. IV and tele removed. Trach collar set up with pt's home O2. Pt left via W/C accompanied by RN.

## 2012-06-12 NOTE — Progress Notes (Signed)
PT Cancellation Note  Patient Details Name: [name] MRN: [id number] DOB: 1940-10-30   Cancelled Treatment:    Reason Eval/Treat Not Completed: Other (comment) Pt dressed and awaiting transport for d/c.   Fabio Asa 06/12/2012, 11:28 AM

## 2012-07-08 ENCOUNTER — Encounter (HOSPITAL_COMMUNITY): Payer: Self-pay | Admitting: Emergency Medicine

## 2012-07-08 ENCOUNTER — Emergency Department (HOSPITAL_COMMUNITY): Payer: Medicare Other

## 2012-07-08 ENCOUNTER — Inpatient Hospital Stay (HOSPITAL_COMMUNITY)
Admission: EM | Admit: 2012-07-08 | Discharge: 2012-07-17 | DRG: 193 | Disposition: A | Discharge status: Home / Self Care | Payer: Medicare Other | Attending: Internal Medicine | Admitting: Internal Medicine

## 2012-07-08 DIAGNOSIS — D638 Anemia in other chronic diseases classified elsewhere: Secondary | ICD-10-CM | POA: Diagnosis present

## 2012-07-08 DIAGNOSIS — I452 Bifascicular block: Secondary | ICD-10-CM

## 2012-07-08 DIAGNOSIS — I472 Ventricular tachycardia, unspecified: Secondary | ICD-10-CM | POA: Diagnosis not present

## 2012-07-08 DIAGNOSIS — Z8673 Personal history of transient ischemic attack (TIA), and cerebral infarction without residual deficits: Secondary | ICD-10-CM

## 2012-07-08 DIAGNOSIS — D649 Anemia, unspecified: Secondary | ICD-10-CM

## 2012-07-08 DIAGNOSIS — J9500 Unspecified tracheostomy complication: Secondary | ICD-10-CM | POA: Diagnosis present

## 2012-07-08 DIAGNOSIS — J449 Chronic obstructive pulmonary disease, unspecified: Secondary | ICD-10-CM | POA: Diagnosis present

## 2012-07-08 DIAGNOSIS — I469 Cardiac arrest, cause unspecified: Secondary | ICD-10-CM

## 2012-07-08 DIAGNOSIS — IMO0002 Reserved for concepts with insufficient information to code with codable children: Secondary | ICD-10-CM | POA: Diagnosis present

## 2012-07-08 DIAGNOSIS — I5033 Acute on chronic diastolic (congestive) heart failure: Secondary | ICD-10-CM

## 2012-07-08 DIAGNOSIS — I428 Other cardiomyopathies: Secondary | ICD-10-CM | POA: Diagnosis present

## 2012-07-08 DIAGNOSIS — Z602 Problems related to living alone: Secondary | ICD-10-CM

## 2012-07-08 DIAGNOSIS — I279 Pulmonary heart disease, unspecified: Secondary | ICD-10-CM

## 2012-07-08 DIAGNOSIS — Z7982 Long term (current) use of aspirin: Secondary | ICD-10-CM

## 2012-07-08 DIAGNOSIS — Z79899 Other long term (current) drug therapy: Secondary | ICD-10-CM

## 2012-07-08 DIAGNOSIS — N184 Chronic kidney disease, stage 4 (severe): Secondary | ICD-10-CM

## 2012-07-08 DIAGNOSIS — N179 Acute kidney failure, unspecified: Secondary | ICD-10-CM | POA: Diagnosis present

## 2012-07-08 DIAGNOSIS — G4733 Obstructive sleep apnea (adult) (pediatric): Secondary | ICD-10-CM

## 2012-07-08 DIAGNOSIS — R1310 Dysphagia, unspecified: Secondary | ICD-10-CM | POA: Diagnosis present

## 2012-07-08 DIAGNOSIS — I214 Non-ST elevation (NSTEMI) myocardial infarction: Secondary | ICD-10-CM

## 2012-07-08 DIAGNOSIS — Y92009 Unspecified place in unspecified non-institutional (private) residence as the place of occurrence of the external cause: Secondary | ICD-10-CM

## 2012-07-08 DIAGNOSIS — G9340 Encephalopathy, unspecified: Secondary | ICD-10-CM

## 2012-07-08 DIAGNOSIS — L02219 Cutaneous abscess of trunk, unspecified: Secondary | ICD-10-CM

## 2012-07-08 DIAGNOSIS — I2789 Other specified pulmonary heart diseases: Secondary | ICD-10-CM | POA: Diagnosis present

## 2012-07-08 DIAGNOSIS — E876 Hypokalemia: Secondary | ICD-10-CM | POA: Diagnosis present

## 2012-07-08 DIAGNOSIS — J189 Pneumonia, unspecified organism: Principal | ICD-10-CM

## 2012-07-08 DIAGNOSIS — E1159 Type 2 diabetes mellitus with other circulatory complications: Secondary | ICD-10-CM | POA: Diagnosis present

## 2012-07-08 DIAGNOSIS — G9349 Other encephalopathy: Secondary | ICD-10-CM | POA: Diagnosis present

## 2012-07-08 DIAGNOSIS — J9610 Chronic respiratory failure, unspecified whether with hypoxia or hypercapnia: Secondary | ICD-10-CM

## 2012-07-08 DIAGNOSIS — I999 Unspecified disorder of circulatory system: Secondary | ICD-10-CM | POA: Diagnosis present

## 2012-07-08 DIAGNOSIS — R197 Diarrhea, unspecified: Secondary | ICD-10-CM | POA: Diagnosis not present

## 2012-07-08 DIAGNOSIS — T17408A Unspecified foreign body in trachea causing other injury, initial encounter: Secondary | ICD-10-CM | POA: Diagnosis present

## 2012-07-08 DIAGNOSIS — I4510 Unspecified right bundle-branch block: Secondary | ICD-10-CM | POA: Diagnosis present

## 2012-07-08 DIAGNOSIS — E1169 Type 2 diabetes mellitus with other specified complication: Secondary | ICD-10-CM | POA: Diagnosis present

## 2012-07-08 DIAGNOSIS — J4489 Other specified chronic obstructive pulmonary disease: Secondary | ICD-10-CM

## 2012-07-08 DIAGNOSIS — I110 Hypertensive heart disease with heart failure: Secondary | ICD-10-CM

## 2012-07-08 DIAGNOSIS — Z823 Family history of stroke: Secondary | ICD-10-CM

## 2012-07-08 DIAGNOSIS — M109 Gout, unspecified: Secondary | ICD-10-CM

## 2012-07-08 DIAGNOSIS — I4729 Other ventricular tachycardia: Secondary | ICD-10-CM | POA: Diagnosis not present

## 2012-07-08 DIAGNOSIS — Z930 Tracheostomy status: Secondary | ICD-10-CM

## 2012-07-08 DIAGNOSIS — J9819 Other pulmonary collapse: Secondary | ICD-10-CM | POA: Diagnosis present

## 2012-07-08 DIAGNOSIS — L03311 Cellulitis of abdominal wall: Secondary | ICD-10-CM

## 2012-07-08 DIAGNOSIS — K7460 Unspecified cirrhosis of liver: Secondary | ICD-10-CM

## 2012-07-08 DIAGNOSIS — I509 Heart failure, unspecified: Secondary | ICD-10-CM | POA: Diagnosis present

## 2012-07-08 DIAGNOSIS — I5023 Acute on chronic systolic (congestive) heart failure: Secondary | ICD-10-CM

## 2012-07-08 DIAGNOSIS — F17200 Nicotine dependence, unspecified, uncomplicated: Secondary | ICD-10-CM

## 2012-07-08 DIAGNOSIS — J45909 Unspecified asthma, uncomplicated: Secondary | ICD-10-CM

## 2012-07-08 DIAGNOSIS — E662 Morbid (severe) obesity with alveolar hypoventilation: Secondary | ICD-10-CM | POA: Diagnosis present

## 2012-07-08 DIAGNOSIS — Z7902 Long term (current) use of antithrombotics/antiplatelets: Secondary | ICD-10-CM

## 2012-07-08 DIAGNOSIS — Z87891 Personal history of nicotine dependence: Secondary | ICD-10-CM

## 2012-07-08 DIAGNOSIS — Z8674 Personal history of sudden cardiac arrest: Secondary | ICD-10-CM

## 2012-07-08 DIAGNOSIS — K219 Gastro-esophageal reflux disease without esophagitis: Secondary | ICD-10-CM | POA: Diagnosis present

## 2012-07-08 DIAGNOSIS — I2510 Atherosclerotic heart disease of native coronary artery without angina pectoris: Secondary | ICD-10-CM

## 2012-07-08 DIAGNOSIS — Z6831 Body mass index (BMI) 31.0-31.9, adult: Secondary | ICD-10-CM

## 2012-07-08 DIAGNOSIS — I5043 Acute on chronic combined systolic (congestive) and diastolic (congestive) heart failure: Secondary | ICD-10-CM | POA: Diagnosis present

## 2012-07-08 DIAGNOSIS — D509 Iron deficiency anemia, unspecified: Secondary | ICD-10-CM | POA: Diagnosis present

## 2012-07-08 DIAGNOSIS — E669 Obesity, unspecified: Secondary | ICD-10-CM

## 2012-07-08 DIAGNOSIS — J9620 Acute and chronic respiratory failure, unspecified whether with hypoxia or hypercapnia: Secondary | ICD-10-CM

## 2012-07-08 DIAGNOSIS — I130 Hypertensive heart and chronic kidney disease with heart failure and stage 1 through stage 4 chronic kidney disease, or unspecified chronic kidney disease: Secondary | ICD-10-CM | POA: Diagnosis present

## 2012-07-08 DIAGNOSIS — E785 Hyperlipidemia, unspecified: Secondary | ICD-10-CM

## 2012-07-08 DIAGNOSIS — Z8249 Family history of ischemic heart disease and other diseases of the circulatory system: Secondary | ICD-10-CM

## 2012-07-08 DIAGNOSIS — Z882 Allergy status to sulfonamides status: Secondary | ICD-10-CM

## 2012-07-08 LAB — POCT I-STAT 3, ART BLOOD GAS (G3+)
Acid-base deficit: 1 mmol/L (ref 0.0–2.0)
Patient temperature: 98.6
pH, Arterial: 7.193 — CL (ref 7.350–7.450)

## 2012-07-08 LAB — POCT I-STAT TROPONIN I
Troponin i, poc: 0.06 ng/mL (ref 0.00–0.08)
Troponin i, poc: 0.1 ng/mL (ref 0.00–0.08)

## 2012-07-08 LAB — CBC WITH DIFFERENTIAL/PLATELET
Basophils Absolute: 0 10*3/uL (ref 0.0–0.1)
HCT: 35.1 % — ABNORMAL LOW (ref 36.0–46.0)
Hemoglobin: 10.5 g/dL — ABNORMAL LOW (ref 12.0–15.0)
Lymphocytes Relative: 49 % — ABNORMAL HIGH (ref 12–46)
Monocytes Relative: 7 % (ref 3–12)
Neutro Abs: 6.1 10*3/uL (ref 1.7–7.7)
Neutrophils Relative %: 42 % — ABNORMAL LOW (ref 43–77)
RDW: 17.1 % — ABNORMAL HIGH (ref 11.5–15.5)
WBC: 14.6 10*3/uL — ABNORMAL HIGH (ref 4.0–10.5)

## 2012-07-08 LAB — BLOOD GAS, ARTERIAL
Acid-Base Excess: 1.1 mmol/L (ref 0.0–2.0)
Drawn by: 310571
FIO2: 0.4 %
pCO2 arterial: 57.6 mmHg (ref 35.0–45.0)
pH, Arterial: 7.293 — ABNORMAL LOW (ref 7.350–7.450)

## 2012-07-08 LAB — PROCALCITONIN: Procalcitonin: 0.1 ng/mL

## 2012-07-08 LAB — URINALYSIS, ROUTINE W REFLEX MICROSCOPIC
Bilirubin Urine: NEGATIVE
Glucose, UA: 250 mg/dL — AB
Hgb urine dipstick: NEGATIVE
Specific Gravity, Urine: 1.023 (ref 1.005–1.030)
pH: 5.5 (ref 5.0–8.0)

## 2012-07-08 LAB — COMPREHENSIVE METABOLIC PANEL
ALT: <5 U/L (ref 0–35)
AST: 14 U/L (ref 0–37)
Albumin: 3.2 g/dL — ABNORMAL LOW (ref 3.5–5.2)
CO2: 24 mEq/L (ref 19–32)
Calcium: 9.7 mg/dL (ref 8.4–10.5)
GFR calc non Af Amer: 24 mL/min — ABNORMAL LOW (ref >90)
Sodium: 142 mEq/L (ref 135–145)
Total Protein: 7 g/dL (ref 6.0–8.3)

## 2012-07-08 LAB — CG4 I-STAT (LACTIC ACID): Lactic Acid, Venous: 0.99 mmol/L (ref 0.5–2.2)

## 2012-07-08 LAB — CREATININE, SERUM
GFR calc Af Amer: 27 mL/min — ABNORMAL LOW (ref >90)
GFR calc non Af Amer: 23 mL/min — ABNORMAL LOW (ref >90)

## 2012-07-08 LAB — MRSA PCR SCREENING: MRSA by PCR: NEGATIVE

## 2012-07-08 LAB — CBC
HCT: 34.8 % — ABNORMAL LOW (ref 36.0–46.0)
Hemoglobin: 10.6 g/dL — ABNORMAL LOW (ref 12.0–15.0)
MCHC: 30.5 g/dL (ref 30.0–36.0)
RBC: 4.2 MIL/uL (ref 3.87–5.11)
WBC: 9.9 10*3/uL (ref 4.0–10.5)

## 2012-07-08 LAB — PRO B NATRIURETIC PEPTIDE: Pro B Natriuretic peptide (BNP): 8263 pg/mL — ABNORMAL HIGH (ref 0–125)

## 2012-07-08 LAB — AMMONIA: Ammonia: 41 umol/L (ref 11–60)

## 2012-07-08 LAB — LACTIC ACID, PLASMA: Lactic Acid, Venous: 1.2 mmol/L (ref 0.5–2.2)

## 2012-07-08 MED ORDER — SODIUM CHLORIDE 0.9 % IJ SOLN
10.0000 mL | INTRAMUSCULAR | Status: DC | PRN
  Administered 2012-07-09 – 2012-07-13 (×10): 10 mL
  Administered 2012-07-13: 20 mL
  Administered 2012-07-14 – 2012-07-15 (×2): 10 mL
  Administered 2012-07-15 (×2): 20 mL
  Administered 2012-07-15 – 2012-07-16 (×4): 10 mL
  Filled 2012-07-08: qty 30
  Filled 2012-07-08 (×2): qty 10
  Filled 2012-07-08: qty 30

## 2012-07-08 MED ORDER — BUDESONIDE 0.25 MG/2ML IN SUSP
0.2500 mg | Freq: Two times a day (BID) | RESPIRATORY_TRACT | Status: DC
  Administered 2012-07-09 – 2012-07-17 (×17): 0.25 mg via RESPIRATORY_TRACT
  Filled 2012-07-08 (×22): qty 2

## 2012-07-08 MED ORDER — ISOSORBIDE MONONITRATE ER 30 MG PO TB24
30.0000 mg | ORAL_TABLET | Freq: Every day | ORAL | Status: DC
  Administered 2012-07-09 – 2012-07-17 (×9): 30 mg via ORAL
  Filled 2012-07-08 (×11): qty 1

## 2012-07-08 MED ORDER — CLONIDINE HCL 0.1 MG PO TABS
0.1000 mg | ORAL_TABLET | Freq: Two times a day (BID) | ORAL | Status: DC
  Administered 2012-07-08 – 2012-07-13 (×11): 0.1 mg via ORAL
  Filled 2012-07-08 (×16): qty 1

## 2012-07-08 MED ORDER — FERROUS SULFATE 325 (65 FE) MG PO TBEC
325.0000 mg | DELAYED_RELEASE_TABLET | Freq: Two times a day (BID) | ORAL | Status: DC
  Administered 2012-07-08 – 2012-07-17 (×17): 325 mg via ORAL
  Filled 2012-07-08 (×23): qty 1

## 2012-07-08 MED ORDER — ARFORMOTEROL TARTRATE 15 MCG/2ML IN NEBU
15.0000 ug | INHALATION_SOLUTION | Freq: Two times a day (BID) | RESPIRATORY_TRACT | Status: DC
  Administered 2012-07-09 – 2012-07-17 (×16): 15 ug via RESPIRATORY_TRACT
  Filled 2012-07-08 (×22): qty 2

## 2012-07-08 MED ORDER — SODIUM CHLORIDE 0.9 % IV SOLN: 250.0000 mL | INTRAVENOUS | Status: DC | PRN

## 2012-07-08 MED ORDER — SERTRALINE HCL 100 MG PO TABS
100.0000 mg | ORAL_TABLET | Freq: Every day | ORAL | Status: DC
  Administered 2012-07-08 – 2012-07-16 (×8): 100 mg via ORAL
  Filled 2012-07-08 (×11): qty 1

## 2012-07-08 MED ORDER — SODIUM CHLORIDE 0.9 % IJ SOLN
3.0000 mL | Freq: Two times a day (BID) | INTRAMUSCULAR | Status: DC
  Administered 2012-07-08 – 2012-07-09 (×2): 3 mL via INTRAVENOUS

## 2012-07-08 MED ORDER — CARVEDILOL 6.25 MG PO TABS
6.2500 mg | ORAL_TABLET | Freq: Two times a day (BID) | ORAL | Status: DC
  Administered 2012-07-09 – 2012-07-17 (×17): 6.25 mg via ORAL
  Filled 2012-07-08 (×21): qty 1

## 2012-07-08 MED ORDER — SODIUM CHLORIDE 0.9 % IJ SOLN
3.0000 mL | INTRAMUSCULAR | Status: DC | PRN
  Filled 2012-07-08: qty 3

## 2012-07-08 MED ORDER — PIPERACILLIN-TAZOBACTAM 3.375 G IVPB
3.3750 g | Freq: Three times a day (TID) | INTRAVENOUS | Status: AC
  Administered 2012-07-08 – 2012-07-14 (×19): 3.375 g via INTRAVENOUS
  Filled 2012-07-08 (×22): qty 50

## 2012-07-08 MED ORDER — POTASSIUM CHLORIDE CRYS ER 20 MEQ PO TBCR
40.0000 meq | EXTENDED_RELEASE_TABLET | Freq: Once | ORAL | Status: DC
  Filled 2012-07-08: qty 2

## 2012-07-08 MED ORDER — PANTOPRAZOLE SODIUM 40 MG PO TBEC
40.0000 mg | DELAYED_RELEASE_TABLET | Freq: Every day | ORAL | Status: DC
  Administered 2012-07-09 – 2012-07-17 (×9): 40 mg via ORAL
  Filled 2012-07-08 (×10): qty 1

## 2012-07-08 MED ORDER — VANCOMYCIN HCL IN DEXTROSE 1-5 GM/200ML-% IV SOLN
1000.0000 mg | Freq: Once | INTRAVENOUS | Status: AC
  Administered 2012-07-08: 1000 mg via INTRAVENOUS
  Filled 2012-07-08: qty 200

## 2012-07-08 MED ORDER — SIMVASTATIN 20 MG PO TABS
20.0000 mg | ORAL_TABLET | Freq: Every day | ORAL | Status: DC
  Administered 2012-07-09 – 2012-07-16 (×8): 20 mg via ORAL
  Filled 2012-07-08 (×10): qty 1

## 2012-07-08 MED ORDER — INSULIN ASPART 100 UNIT/ML ~~LOC~~ SOLN: 0.0000 [IU] | Freq: Three times a day (TID) | SUBCUTANEOUS | Status: DC

## 2012-07-08 MED ORDER — ALBUTEROL SULFATE (5 MG/ML) 0.5% IN NEBU: 2.5000 mg | INHALATION_SOLUTION | RESPIRATORY_TRACT | Status: DC | PRN

## 2012-07-08 MED ORDER — VANCOMYCIN HCL IN DEXTROSE 750-5 MG/150ML-% IV SOLN
750.0000 mg | INTRAVENOUS | Status: AC
  Administered 2012-07-09 – 2012-07-14 (×6): 750 mg via INTRAVENOUS
  Filled 2012-07-08 (×7): qty 150

## 2012-07-08 MED ORDER — AMLODIPINE BESYLATE 5 MG PO TABS
5.0000 mg | ORAL_TABLET | Freq: Every day | ORAL | Status: DC
  Administered 2012-07-09 – 2012-07-17 (×9): 5 mg via ORAL
  Filled 2012-07-08 (×11): qty 1

## 2012-07-08 MED ORDER — HEPARIN SODIUM (PORCINE) 5000 UNIT/ML IJ SOLN
5000.0000 [IU] | Freq: Three times a day (TID) | INTRAMUSCULAR | Status: DC
  Administered 2012-07-08 – 2012-07-11 (×10): 5000 [IU] via SUBCUTANEOUS
  Filled 2012-07-08 (×12): qty 1

## 2012-07-08 MED ORDER — METOCLOPRAMIDE HCL 5 MG PO TABS
5.0000 mg | ORAL_TABLET | Freq: Four times a day (QID) | ORAL | Status: DC
  Administered 2012-07-08 – 2012-07-17 (×35): 5 mg via ORAL
  Filled 2012-07-08 (×40): qty 1

## 2012-07-08 MED ORDER — TORSEMIDE 20 MG PO TABS
20.0000 mg | ORAL_TABLET | Freq: Two times a day (BID) | ORAL | Status: DC
  Administered 2012-07-09 – 2012-07-17 (×17): 20 mg via ORAL
  Filled 2012-07-08 (×18): qty 1

## 2012-07-08 MED ORDER — PIPERACILLIN-TAZOBACTAM 3.375 G IVPB
3.3750 g | Freq: Once | INTRAVENOUS | Status: DC
  Filled 2012-07-08 (×2): qty 50

## 2012-07-08 MED ORDER — INSULIN ASPART 100 UNIT/ML ~~LOC~~ SOLN
0.0000 [IU] | Freq: Three times a day (TID) | SUBCUTANEOUS | Status: DC
  Administered 2012-07-09 – 2012-07-11 (×3): 1 [IU] via SUBCUTANEOUS
  Filled 2012-07-08: qty 0.09

## 2012-07-08 MED ORDER — ASPIRIN 81 MG PO CHEW
81.0000 mg | CHEWABLE_TABLET | Freq: Every day | ORAL | Status: DC
  Administered 2012-07-09 – 2012-07-17 (×9): 81 mg via ORAL
  Filled 2012-07-08 (×10): qty 1

## 2012-07-08 MED ORDER — FUROSEMIDE 10 MG/ML IJ SOLN
40.0000 mg | Freq: Once | INTRAMUSCULAR | Status: DC
  Filled 2012-07-08: qty 4

## 2012-07-08 NOTE — Progress Notes (Signed)
Peripherally Inserted Central Catheter/Midline Placement  The IV Nurse has discussed with the patient and/or persons authorized to consent for the patient, the purpose of this procedure and the potential benefits and risks involved with this procedure.  The benefits include less needle sticks, lab draws from the catheter and patient may be discharged home with the catheter.  Risks include, but not limited to, infection, bleeding, blood clot (thrombus formation), and puncture of an artery; nerve damage and irregular heat beat.  Alternatives to this procedure were also discussed.  PICC/Midline Placement Documentation  PICC Triple Lumen 07/08/12 PICC Right Basilic (Active)  Indication for Insertion or Continuance of Line Limited venous access - need for IV therapy >5 days (PICC only);Chronic illness with exacerbations (CF, Sickle Cell, etc.) 07/08/2012  7:00 PM  Length mark (cm) 1 cm 07/08/2012  7:00 PM  Site Assessment Clean;Dry;Intact 07/08/2012  7:00 PM  Lumen #1 Status Flushed;Saline locked;Blood return noted 07/08/2012  7:00 PM  Lumen #2 Status Flushed;Saline locked;Blood return noted 07/08/2012  7:00 PM  Lumen #3 Status Flushed;Saline locked;Blood return noted 07/08/2012  7:00 PM  Dressing Type Transparent 07/08/2012  7:00 PM  Dressing Status Clean;Dry;Intact;Antimicrobial disc in place 07/08/2012  7:00 PM  Dressing Intervention New dressing 07/08/2012  7:00 PM  Dressing Change Due 07/15/12 07/08/2012  7:00 PM       Geoffery Spruce 07/08/2012, 7:47 PM

## 2012-07-08 NOTE — Progress Notes (Signed)
Pt was brought in by EMS on BiPAP, RT was aware that pt was a #6 shiley cuffless trach and was placed on [facility] NIV BiPAP. After ABG MD wanted to take pt off BiPAP and place pt on ATC. Pt is on ATC 80% 10 LPM.

## 2012-07-08 NOTE — Progress Notes (Signed)
Called by RN to come see pt because work of breath had increased and that pt had been coughing. RT suctioned pt and got small Analyse Angst/clear secretions. Pt stated that she felt better.

## 2012-07-08 NOTE — Progress Notes (Signed)
[facility] PCCM  Discussed IV access with RN.  Will place PICC.  Has CKD, but not on HD so no absolute contraindication to PICC.  Yolonda Kida PCCM Pager: [phone] Cell: [phone] If no response, call [phone]

## 2012-07-08 NOTE — Progress Notes (Deleted)
Pt brought in by EMS on BiPAP, RT placed pt on  NIV BiPAP. MD wanted to take pt off BiPAP and put on ATC. Pt is on 80% ATC 10 LPM.

## 2012-07-08 NOTE — H&P (Signed)
PULMONARY  / CRITICAL CARE MEDICINE  Name: [name] MRN: [id number] DOB: 10/05/1940    ADMISSION DATE:  07/08/2012  REFERRING MD :  EDP PRIMARY SERVICE: PCCM   CHIEF COMPLAINT:  Resp Distress, Hypercarbic , AMS   BRIEF PATIENT DESCRIPTION: 72 yo AAF hx of cardiopulmonary arrest 01/2012 , s/p chronic trach , recurrent admission for trach issues, mucus plugging and HCAP (last admit 05/2011) . Brought to ER for resp distress and altered mental status found to by hypercarbic w/ PCO2 at 73. CCM asked to admit .   SIGNIFICANT EVENTS / STUDIES:    LINES / TUBES: 01/2012 Chronic trach >  CULTURES: 5/17 Sputum Cx >> 5/17 BC x 2 >>  ANTIBIOTICS: 5/17 Vanc > 5/17 Zosyn >   HISTORY OF PRESENT ILLNESS:    72 y.o. female witH h/o cardio pulmonary arrest in 01/2012 , s/p trach, s/p recent admission for acute on chronic respirator failure and discharged on 4/20 was brought in today for respiratory distress by EMS.   She was also found to have ?RLL  ASD and hypercarbic with PCo2 at 73 . Lives at home. EMS noted decreased air movement , gave nebs, steroids, mag and placed on BIPAP (capped trach ) .  SHe is afebrile , WBC slightly elevated at 14K .  Pt says she feels more short of breath than usual.  She is more alert , answers questions and follows commands.  Does appear slightly sleepy/lethargic.  She denies chest pain, n/v, increased edema.  Daughter says over last 2 days with increased cough and confusion  Lives with daughter, [name] (she was at HD this am and on return noticed struggling for breath) . She had taken O2 off, O2 concentraor not working.  [facility] .   Pt has a very complicated hx of COPD / OSA / prev SNF pt that had hypercarbic respiratory failure and had issues of recurrent intubatin  That underwent  percutaneous tracheostomy 11/23/11 by Dr. Tyson Alias and discharged 11/25/11.   Had trach dislodgement 01/2012 w/ track closure.  Refused trach -redo but  then suffered  asystolic cardiac arrest was intubated and resuscitated. Hospital course complicated by HCAP, decompensated CHF, hypotension, acute on chronic renal failure and post arrest encephalopathy. She underwent re-do tracheostomy on 12/27.  Last echo 01/2012 w/ EF 45-50%, severe LV hypertrophy, LV diffuse hypokinesis, grade 1 diastolic dysfxn. LA mod dilated.    PAST MEDICAL HISTORY :  Past Medical History  Diagnosis Date  . COPD (chronic obstructive pulmonary disease)     on home o2  . Chronic renal insufficiency   . CVA (cerebral infarction)     hx  . Hypertensive heart disease with congestive heart failure   . Chronic cor pulmonale   . Cardiomyopathy of undetermined type 09/07/2011  . Chronic kidney disease (CKD), stage IV (severe)   . Hyperlipdemia   . Obesity (BMI 30-39.9)   . Sleep apnea   . History of gout 09/07/2011  . Heart failure 7/13  . Shortness of breath   . Anemia   . Vascular disease   . Asthma   . Anginal pain   . Hypertension   . Type II or unspecified type diabetes mellitus without mention of complication, not stated as uncontrolled   . Stroke   . GERD (gastroesophageal reflux disease)   . Arthritis   . Bundle branch block   . Thrombocytopenia   . Acute and chronic respiratory failure    Past Surgical History  Procedure  Laterality Date  . Bilateral oophorectomy    . Cardiac catheterization      right heart cath  . Laparoscopic gastrostomy  12/21/2011    Procedure: LAPAROSCOPIC GASTROSTOMY;  Surgeon: Axel Filler, MD;  Location: [facility] OR;  Service: General;  Laterality: N/A;  laparoscopic gastrostomy possible open feeding tube  . Abdominal hysterectomy    . Tracheostomy  11/2011  . Cataracts    . Tracheostomy tube placement  02/18/2012    Procedure: TRACHEOSTOMY;  Surgeon: Serena Colonel, MD;  Location: [facility] OR;  Service: ENT;  Laterality: N/A;   Prior to Admission medications   Medication Sig Start Date End Date Taking? Authorizing Provider  albuterol (PROVENTIL)  (5 MG/ML) 0.5% nebulizer solution Take 0.5 mLs (2.5 mg total) by nebulization every 4 (four) hours as needed for wheezing. 06/11/12   Belkys A Regalado, MD  amLODipine (NORVASC) 10 MG tablet Take 10 mg by mouth daily.    Historical Provider, MD  aspirin 81 MG chewable tablet Chew 1 tablet (81 mg total) by mouth daily. 02/19/12   Simonne Martinet, NP  busPIRone (BUSPAR) 5 MG tablet Take 5 mg by mouth 2 (two) times daily.    Historical Provider, MD  carvedilol (COREG) 6.25 MG tablet Take 1 tablet (6.25 mg total) by mouth 2 (two) times daily with a meal. 05/19/12   Henderson Cloud, MD  cloNIDine (CATAPRES) 0.1 MG tablet Take 0.1 mg by mouth 2 (two) times daily.    Historical Provider, MD  ferrous sulfate 325 (65 FE) MG EC tablet Take 325 mg by mouth 2 (two) times daily.    Historical Provider, MD  hydrALAZINE (APRESOLINE) 100 MG tablet Take 100 mg by mouth 3 (three) times daily.    Historical Provider, MD  isosorbide mononitrate (IMDUR) 30 MG 24 hr tablet Take 1 tablet (30 mg total) by mouth daily. 05/19/12   Henderson Cloud, MD  levofloxacin (LEVAQUIN) 250 MG tablet Take 1 tablet (250 mg total) by mouth daily. 06/11/12   Belkys A Regalado, MD  metoCLOPramide (REGLAN) 5 MG tablet Take 5 mg by mouth 4 (four) times daily.    Historical Provider, MD  oxycodone (OXY-IR) 5 MG capsule Take 5 mg by mouth every 6 (six) hours as needed (for moderate pain).    Historical Provider, MD  pantoprazole (PROTONIX) 20 MG tablet Take 20 mg by mouth every evening.    Historical Provider, MD  pravastatin (PRAVACHOL) 40 MG tablet Take 40 mg by mouth daily.    Historical Provider, MD  sertraline (ZOLOFT) 100 MG tablet Take 100 mg by mouth at bedtime.    Historical Provider, MD  torsemide (DEMADEX) 20 MG tablet Take 20 mg by mouth 2 (two) times daily.    Historical Provider, MD   Allergies  Allergen Reactions  . Other Itching    "Wool"  . Sulfonamide Derivatives Hives and Rash    FAMILY HISTORY:   Family History  Problem Relation Age of Onset  . Heart attack Father   . Stroke Mother     CVA  . Coronary artery disease Daughter    SOCIAL HISTORY:  reports that she quit smoking about 10 months ago. Her smoking use included Cigarettes. She has a 55 pack-year smoking history. She has never used smokeless tobacco. She reports that she does not drink alcohol or use illicit drugs.  REVIEW OF SYSTEMS:   Constitutional:   No  weight loss, night sweats,  Fevers, chills,  +fatigue, or  lassitude.  HEENT:   No headaches,   t,                No sneezing, itching, ear ache,  +nasal congestion, post nasal drip,   CV:  No chest pain,  Orthopnea, PND,  +swelling in lower extremities, Neg  anasarca, dizziness, palpitations, syncope.   GI  No heartburn, indigestion, abdominal pain, nausea, vomiting, diarrhea, change in bowel habits, loss of appetite, bloody stools.   Resp: ++ shortness of breath with exertion or at rest.  No coughing up of blood.  No change in color of mucus. .  No chest wall deformity  Skin: no rash or lesions.  GU:    No flank pain, no hematuria   MS:  No joint pain or swelling.  No decreased range of motion.  No back pain.  Psych: +lethargic    SUBJECTIVE:  Increased SOB   VITAL SIGNS: Temp:  [97.6 F (36.4 C)] 97.6 F (36.4 C) (05/17 1048) Pulse Rate:  [82-123] 82 (05/17 1131) Resp:  [19-30] 19 (05/17 1131) BP: (159)/(62) 159/62 mmHg (05/17 1048) SpO2:  [96 %-100 %] 100 % (05/17 1131) FiO2 (%):  [80 %] 80 % (05/17 1131) HEMODYNAMICS:   VENTILATOR SETTINGS: Vent Mode:  [-]  FiO2 (%):  [80 %] 80 % INTAKE / OUTPUT: Intake/Output   None     PHYSICAL EXAMINATION: GEN: A/Ox1 , lethargic,no resp distress   HEENT:  [place]/AT,    THROAT-clear, no lesions  NECK:  Supple  no JVD; normal carotid impulses w/o bruits;  Trach -midline , dsg clean   RESP  Diminished BS in bases , no wheezing no accessory muscle use, no dullness to percussion  CARD:  RRR, no  2/6 SM   , 1+ peripheral edema, pulses intact, no cyanosis or clubbing.  GI:   Soft & nt; nml bowel sounds; no organomegaly or masses detected.  Musco: Warm bil, no deformities or joint swelling noted.   Neuro: alert, no focal deficits noted.    Skin: Warm, no lesions or rashes   LABS:  Recent Labs Lab 07/08/12 1050 07/08/12 1111  HGB 10.5*  --   WBC 14.6*  --   PLT 234  --   PHART  --  7.193*  PCO2ART  --  74.4*  PO2ART  --  63.0*   No results found for this basename: GLUCAP,  in the last 168 hours  CXR: Right lower lung atelectasis versus airspace disease.  Cardiomegaly with pulmonary vascular congestion. Left basilar scarring.    ASSESSMENT / PLAN:  PULMONARY A : Acute on chronic Hypercarbic Resp  Failure -chronic trach dependent  ?RLL HCAP  P:   BEgin IV abx -Vanc/Zosyn IV  Trach collar for sats >90% , consider changing to cuffed trach Pulm Hygiene , add Budesonide/Brovana Neb, As needed  Albuterol  Check abg in am  Lasix 40mg  x 1 .   CARDIOVASCULAR A: Chronic systolic and diastolic heart failure:  ?mild overload  , elevated BNP   HTN  CAD   P:  Tr BNP  Continue home meds norvasc (lower dose) ,coreg, clonidine,imdur , demadex  Hold hydralazine for now, restart if b/p tr up  Lasix x 1    RENAL A:   CKD stage 4 baseline scr 2.6 -Scr stable  Hypokalemia   P:   Replace K+   GASTROINTESTINAL A: GERD  P:   PPI  NPO except for meds NPO until Swallow eval , then proceed w/ diet recs  HEMATOLOGIC A:  Anemia of chronic disease  -hbg stable  P:  Follow cbc  Cont iron rx  Hep sq for DVT   INFECTIOUS A:  ? HCAP w/ RLL aspdz ? Aspiration (afebrile/mild wbc bump)  P:   Check sputum cx  Begin Zosyn /Vanc    ENDOCRINE A:  Hyperglycemia  P:   SSI  Check A1C   NEUROLOGIC A:  Lethargic secondary to hypercarbia +/- infection  P:   Limit sedating rx   Global Issues :  PT is full code  Family conversation : Daughter [name] says she is a  full code.     TODAY'S SUMMARY:  I have personally obtained a history, examined the patient, evaluated laboratory and imaging results, formulated the assessment and plan and placed orders. CRITICAL CARE: The patient is critically ill with multiple organ systems failure and requires high complexity decision making for assessment and support, frequent evaluation and titration of therapies, application of advanced monitoring technologies and extensive interpretation of multiple databases. Critical Care Time devoted to patient care services described in this note is  minutes.   [facility] NP-C  Pulmonary and Critical Care Medicine [facility] Pager: [phone]  07/08/2012, 11:56 AM   Attending:  I have seen and examined the patient with nurse practitioner/resident and agree with the note above.   Recurrent pneumonia in this unfortunately lady with COPD and a chronic trach.  Her mental status and respiratory status have improved with bronchodilators.  It doesn't look like she will need mechanical ventilation at this point.  Will treat for pneumonia and CHF.  Monitor ABG.  Admit to stepdown, monitor closely  Yolonda Kida PCCM Pager: [phone] Cell: [phone] If no response, call [phone]

## 2012-07-08 NOTE — Progress Notes (Signed)
Admission Note:  Data:  Patient to floor from ED at 1550 with Rn in stretcher. Patient alert on 9liter O2 trach collar #6 shilley non disposable cuffless.  Patient c/o of sore R knee. Patient with audible rhonchi.  Action: assisted with transfer to be. Initiated admission protocol. x1 tracheal suction pass with small thick mucous secretions. Elevated HOB  Response: patient tolerated suction well and Sp02 remained 99-100% on 40% Trach collar. Patient safe, neuro stable, and not exhibiting any signs/ symptoms of distress after interventions

## 2012-07-08 NOTE — Progress Notes (Signed)
ANTIBIOTIC CONSULT NOTE - INITIAL  Pharmacy Consult for vancomycin and zosyn Indication: hcap  Allergies  Allergen Reactions  . Other Itching    "Wool"  . Sulfonamide Derivatives Hives and Rash    Patient Measurements:  wt: 75.2 kg  Vital Signs: Temp: 97.6 F (36.4 C) (05/17 1048) Temp src: Oral (05/17 1048) BP: 127/45 mmHg (05/17 1215) Pulse Rate: 84 (05/17 1215) Intake/Output from previous day:   Intake/Output from this shift:    Labs:  Recent Labs  07/08/12 1050  WBC 14.6*  HGB 10.5*  PLT 234  CREATININE 1.95*   The CrCl is unknown because both a height and weight (above a minimum accepted value) are required for this calculation. No results found for this basename: VANCOTROUGH, VANCOPEAK, VANCORANDOM, GENTTROUGH, GENTPEAK, GENTRANDOM, TOBRATROUGH, TOBRAPEAK, TOBRARND, AMIKACINPEAK, AMIKACINTROU, AMIKACIN,  in the last 72 hours   Microbiology: No results found for this or any previous visit (from the past 720 hour(s)).  Medical History: Past Medical History  Diagnosis Date  . COPD (chronic obstructive pulmonary disease)     on home o2  . Chronic renal insufficiency   . CVA (cerebral infarction)     hx  . Hypertensive heart disease with congestive heart failure   . Chronic cor pulmonale   . Cardiomyopathy of undetermined type 09/07/2011  . Chronic kidney disease (CKD), stage IV (severe)   . Hyperlipdemia   . Obesity (BMI 30-39.9)   . Sleep apnea   . History of gout 09/07/2011  . Heart failure 7/13  . Shortness of breath   . Anemia   . Vascular disease   . Asthma   . Anginal pain   . Hypertension   . Type II or unspecified type diabetes mellitus without mention of complication, not stated as uncontrolled   . Stroke   . GERD (gastroesophageal reflux disease)   . Arthritis   . Bundle branch block   . Thrombocytopenia   . Acute and chronic respiratory failure     Medications:  Scheduled:  . amLODipine  5 mg Oral Daily  . aspirin  81 mg Oral  Daily  . carvedilol  6.25 mg Oral BID WC  . cloNIDine  0.1 mg Oral BID  . ferrous sulfate  325 mg Oral BID  . heparin  5,000 Units Subcutaneous Q8H  . insulin aspart  0-9 Units Subcutaneous Q8H  . isosorbide mononitrate  30 mg Oral Daily  . metoCLOPramide  5 mg Oral QID  . pantoprazole  40 mg Oral Daily  . potassium chloride  40 mEq Oral Once  . sertraline  100 mg Oral QHS  . simvastatin  20 mg Oral q1800  . sodium chloride  3 mL Intravenous Q12H  . [START ON 07/09/2012] torsemide  20 mg Oral BID   Infusions:  . sodium chloride    . piperacillin-tazobactam (ZOSYN)  IV    . vancomycin     Assessment: 72 yo F with acute encephalopathy and HCAP. Of note, patient has a previous trach that is capped.  Vancomycin 1g IV x1 and Zosyn 3.375 G IV x 1 ordered for patient today at 11:30 am.  SCr 1.95>>CrCl~29 ml/min.  Goal of Therapy:  Vancomycin trough level 15-20 mcg/ml  Plan:  - After initial dose of Vanc 1g, start Vancomycin 750 mg IV q24h - Zosyn 3.375G IV q8h to be infused over 4 hours - Follow up SCr, UOP, cultures, clinical course and adjust as clinically indicated  Evagelia Knack L. Illene Bolus, PharmD, BCPS Clinical Pharmacist  Pager: [phone] Pharmacy: [phone] 07/08/2012 1:08 PM

## 2012-07-08 NOTE — Progress Notes (Signed)
Pt came in to ED with a #6 shiley trach cuffless with a disposable inner cannula. RT took out disposable inner cannula and replaced with correct non-disposable inner. Pt vitals are stable HR 82 RR 19 Oxygen saturation on 80% ATC is 100%.

## 2012-07-08 NOTE — ED Provider Notes (Signed)
History     CSN: [id number]  Arrival date & time 07/08/12  1032   First MD Initiated Contact with Patient 07/08/12 1033      Chief Complaint  Patient presents with  . Respiratory Distress   level V caveat due 2 altered mental status.  (Consider location/radiation/quality/duration/timing/severity/associated sxs/prior treatment) The history is provided by the EMS personnel and the patient.   patient was brought in by EMS for shortness of breath. EMS states he didn't have time to get a blood pressure or pulse ox. She was started on BiPAP, given a breathing treatment, and given magnesium and steroids. She has a previous trach but it is capped. She is swelling or legs. EMS states family members were not helpful.  Past Medical History  Diagnosis Date  . COPD (chronic obstructive pulmonary disease)     on home o2  . Chronic renal insufficiency   . CVA (cerebral infarction)     hx  . Hypertensive heart disease with congestive heart failure   . Chronic cor pulmonale   . Cardiomyopathy of undetermined type 09/07/2011  . Chronic kidney disease (CKD), stage IV (severe)   . Hyperlipdemia   . Obesity (BMI 30-39.9)   . Sleep apnea   . History of gout 09/07/2011  . Heart failure 7/13  . Shortness of breath   . Anemia   . Vascular disease   . Asthma   . Anginal pain   . Hypertension   . Type II or unspecified type diabetes mellitus without mention of complication, not stated as uncontrolled   . Stroke   . GERD (gastroesophageal reflux disease)   . Arthritis   . Bundle branch block   . Thrombocytopenia   . Acute and chronic respiratory failure     Past Surgical History  Procedure Laterality Date  . Bilateral oophorectomy    . Cardiac catheterization      right heart cath  . Laparoscopic gastrostomy  12/21/2011    Procedure: LAPAROSCOPIC GASTROSTOMY;  Surgeon: Axel Filler, MD;  Location: [facility] OR;  Service: General;  Laterality: N/A;  laparoscopic gastrostomy possible open feeding  tube  . Abdominal hysterectomy    . Tracheostomy  11/2011  . Cataracts    . Tracheostomy tube placement  02/18/2012    Procedure: TRACHEOSTOMY;  Surgeon: Serena Colonel, MD;  Location: [facility] OR;  Service: ENT;  Laterality: N/A;    Family History  Problem Relation Age of Onset  . Heart attack Father   . Stroke Mother     CVA  . Coronary artery disease Daughter     History  Substance Use Topics  . Smoking status: Former Smoker -- 1.00 packs/day for 55 years    Types: Cigarettes    Quit date: 08/28/2011  . Smokeless tobacco: Never Used  . Alcohol Use: No    OB History   Grav Para Term Preterm Abortions TAB SAB Ect Mult Living                  Review of Systems  Unable to perform ROS   Allergies  Other and Sulfonamide derivatives  Home Medications   Current Outpatient Rx  Name  Route  Sig  Dispense  Refill  . albuterol (PROVENTIL) (5 MG/ML) 0.5% nebulizer solution   Nebulization   Take 0.5 mLs (2.5 mg total) by nebulization every 4 (four) hours as needed for wheezing.   20 mL   0   . amLODipine (NORVASC) 10 MG tablet   Oral  Take 10 mg by mouth daily.         Marland Kitchen aspirin 81 MG chewable tablet   Oral   Chew 1 tablet (81 mg total) by mouth daily.         . busPIRone (BUSPAR) 5 MG tablet   Oral   Take 5 mg by mouth 2 (two) times daily.         . carvedilol (COREG) 6.25 MG tablet   Oral   Take 1 tablet (6.25 mg total) by mouth 2 (two) times daily with a meal.   60 tablet   1   . cloNIDine (CATAPRES) 0.1 MG tablet   Oral   Take 0.1 mg by mouth 2 (two) times daily.         . ferrous sulfate 325 (65 FE) MG EC tablet   Oral   Take 325 mg by mouth 2 (two) times daily.         . hydrALAZINE (APRESOLINE) 100 MG tablet   Oral   Take 100 mg by mouth 3 (three) times daily.         . isosorbide mononitrate (IMDUR) 30 MG 24 hr tablet   Oral   Take 1 tablet (30 mg total) by mouth daily.   30 tablet   1   . levofloxacin (LEVAQUIN) 250 MG tablet    Oral   Take 1 tablet (250 mg total) by mouth daily.   3 tablet   0   . metoCLOPramide (REGLAN) 5 MG tablet   Oral   Take 5 mg by mouth 4 (four) times daily.         Marland Kitchen oxycodone (OXY-IR) 5 MG capsule   Oral   Take 5 mg by mouth every 6 (six) hours as needed (for moderate pain).         . pantoprazole (PROTONIX) 20 MG tablet   Oral   Take 20 mg by mouth every evening.         . pravastatin (PRAVACHOL) 40 MG tablet   Oral   Take 40 mg by mouth daily.         . sertraline (ZOLOFT) 100 MG tablet   Oral   Take 100 mg by mouth at bedtime.         . torsemide (DEMADEX) 20 MG tablet   Oral   Take 20 mg by mouth 2 (two) times daily.           BP 127/45  Pulse 84  Temp(Src) 97.6 F (36.4 C) (Oral)  Resp 23  SpO2 100%  Physical Exam  Constitutional: She appears well-developed.  HENT:  Head: Normocephalic.  Eyes: Pupils are equal, round, and reactive to light.  Neck:  Patient has a capped trach in place  Cardiovascular: Normal rate.   Pulmonary/Chest:  Diffuse harsh breath sounds.  Abdominal: There is no tenderness.  Musculoskeletal: She exhibits edema.  Moderate edema bilateral lower extremities.  Neurological:  Patient is breathing spontaneously. She'll not follow commands. She does not withdraw from pain for me. She would somewhat look to voice.  Skin: Skin is warm.    ED Course  Procedures (including critical care time)  Labs Reviewed  CBC WITH DIFFERENTIAL - Abnormal; Notable for the following:    WBC 14.6 (*)    Hemoglobin 10.5 (*)    HCT 35.1 (*)    MCH 24.9 (*)    MCHC 29.9 (*)    RDW 17.1 (*)    Neutrophils Relative % 42 (*)  Lymphocytes Relative 49 (*)    Lymphs Abs 7.2 (*)    All other components within normal limits  COMPREHENSIVE METABOLIC PANEL - Abnormal; Notable for the following:    Potassium 3.1 (*)    Glucose, Bld 197 (*)    Creatinine, Ser 1.95 (*)    Albumin 3.2 (*)    GFR calc non Af Amer 24 (*)    GFR calc Af Amer 28  (*)    All other components within normal limits  PRO B NATRIURETIC PEPTIDE - Abnormal; Notable for the following:    Pro B Natriuretic peptide (BNP) 8263.0 (*)    All other components within normal limits  POCT I-STAT TROPONIN I - Abnormal; Notable for the following:    Troponin i, poc 0.10 (*)    All other components within normal limits  POCT I-STAT 3, BLOOD GAS (G3+) - Abnormal; Notable for the following:    pH, Arterial 7.193 (*)    pCO2 arterial 74.4 (*)    pO2, Arterial 63.0 (*)    Bicarbonate 28.6 (*)    All other components within normal limits  CULTURE, BLOOD (ROUTINE X 2)  CULTURE, BLOOD (ROUTINE X 2)  AMMONIA  URINALYSIS, ROUTINE W REFLEX MICROSCOPIC  POCT I-STAT TROPONIN I   Dg Chest Port 1 View  07/08/2012   *RADIOLOGY REPORT*  Clinical Data: Shortness of breath.  PORTABLE CHEST - 1 VIEW  Comparison: 06/04/2012 prior chest radiographs  Findings: Cardiomegaly and tracheostomy tube again noted. Elevation of the left hemidiaphragm with left basilar scarring again noted. Pulmonary vascular congestion is present. Right lower lung atelectasis versus airspace disease present. There is no evidence of pneumothorax or large pleural effusion.  IMPRESSION: Right lower lung atelectasis versus airspace disease.  Cardiomegaly with pulmonary vascular congestion.  Left basilar scarring.   Original Report Authenticated By: Harmon Pier, M.D.     1. Acute-on-chronic respiratory failure   2. Acute encephalopathy   3. HCAP (healthcare-associated pneumonia)      Date: 07/08/2012  Rate: 99  Rhythm: sinus tachycardia  QRS Axis: right  Intervals: normal  ST/T Wave abnormalities: nonspecific ST/T changes  Conduction Disutrbances:right bundle branch block  Narrative Interpretation: LVH  Old EKG Reviewed: unchanged    MDM  Patient presents with altered mental status and acute on chronic respiratory failure. She has CO2 retention and acidosis. X-ray shows possible pneumonia. She has a trach  which will be changed over to collared so she can be vented as needed. She'll be admitted to the ICU.        Juliet Rude. Rubin Payor, MD 07/08/12 1253

## 2012-07-08 NOTE — ED Notes (Signed)
Received pt from home with c/o respiratory distress. Pt given solumedrol, magnesium, albuterol neb treatment and started on CPAP.

## 2012-07-09 ENCOUNTER — Inpatient Hospital Stay (HOSPITAL_COMMUNITY): Payer: Medicare Other

## 2012-07-09 DIAGNOSIS — E1159 Type 2 diabetes mellitus with other circulatory complications: Secondary | ICD-10-CM

## 2012-07-09 DIAGNOSIS — Z930 Tracheostomy status: Secondary | ICD-10-CM

## 2012-07-09 DIAGNOSIS — I798 Other disorders of arteries, arterioles and capillaries in diseases classified elsewhere: Secondary | ICD-10-CM

## 2012-07-09 DIAGNOSIS — D649 Anemia, unspecified: Secondary | ICD-10-CM

## 2012-07-09 DIAGNOSIS — N184 Chronic kidney disease, stage 4 (severe): Secondary | ICD-10-CM

## 2012-07-09 LAB — COMPREHENSIVE METABOLIC PANEL
AST: 30 U/L (ref 0–37)
Albumin: 2.7 g/dL — ABNORMAL LOW (ref 3.5–5.2)
Alkaline Phosphatase: 66 U/L (ref 39–117)
CO2: 28 mEq/L (ref 19–32)
Chloride: 106 mEq/L (ref 96–112)
GFR calc non Af Amer: 20 mL/min — ABNORMAL LOW (ref >90)
Potassium: 4 mEq/L (ref 3.5–5.1)
Total Bilirubin: 0.2 mg/dL — ABNORMAL LOW (ref 0.3–1.2)

## 2012-07-09 LAB — CBC
HCT: 27.5 % — ABNORMAL LOW (ref 36.0–46.0)
Hemoglobin: 8.5 g/dL — ABNORMAL LOW (ref 12.0–15.0)
MCV: 81.6 fL (ref 78.0–100.0)
Platelets: 166 10*3/uL (ref 150–400)
RBC: 3.37 MIL/uL — ABNORMAL LOW (ref 3.87–5.11)
WBC: 5.9 10*3/uL (ref 4.0–10.5)

## 2012-07-09 LAB — BLOOD GAS, ARTERIAL
Acid-Base Excess: 2.8 mmol/L — ABNORMAL HIGH (ref 0.0–2.0)
Bicarbonate: 28.4 mEq/L — ABNORMAL HIGH (ref 20.0–24.0)
O2 Saturation: 86.5 %
TCO2: 30.1 mmol/L (ref 0–100)
pCO2 arterial: 58 mmHg (ref 35.0–45.0)
pO2, Arterial: 51.4 mmHg — ABNORMAL LOW (ref 80.0–100.0)

## 2012-07-09 LAB — GLUCOSE, CAPILLARY
Glucose-Capillary: 110 mg/dL — ABNORMAL HIGH (ref 70–99)
Glucose-Capillary: 133 mg/dL — ABNORMAL HIGH (ref 70–99)
Glucose-Capillary: 101 mg/dL — ABNORMAL HIGH (ref 70–99)

## 2012-07-09 MED ORDER — ACETAMINOPHEN 325 MG PO TABS
650.0000 mg | ORAL_TABLET | Freq: Four times a day (QID) | ORAL | Status: DC | PRN
  Administered 2012-07-09 – 2012-07-13 (×3): 650 mg via ORAL
  Filled 2012-07-09 (×4): qty 2

## 2012-07-09 MED ORDER — FLUCONAZOLE 100 MG PO TABS
100.0000 mg | ORAL_TABLET | Freq: Every day | ORAL | Status: AC
  Administered 2012-07-09 – 2012-07-15 (×7): 100 mg via ORAL
  Filled 2012-07-09 (×7): qty 1

## 2012-07-09 MED ORDER — OXYCODONE HCL 5 MG PO TABS
5.0000 mg | ORAL_TABLET | Freq: Four times a day (QID) | ORAL | Status: DC | PRN
  Administered 2012-07-09 – 2012-07-13 (×3): 5 mg via ORAL
  Filled 2012-07-09 (×3): qty 1

## 2012-07-09 MED ORDER — ALTEPLASE 2 MG IJ SOLR
2.0000 mg | Freq: Once | INTRAMUSCULAR | Status: AC
  Administered 2012-07-09: 2 mg
  Filled 2012-07-09: qty 2

## 2012-07-09 MED ORDER — ALTEPLASE 2 MG IJ SOLR
2.0000 mg | Freq: Once | INTRAMUSCULAR | Status: AC
  Administered 2012-07-09: 2 mg
  Filled 2012-07-09 (×3): qty 2

## 2012-07-09 NOTE — Progress Notes (Signed)
PULMONARY  / CRITICAL CARE MEDICINE  Name: [name] MRN: [id number] DOB: 1941-01-07    ADMISSION DATE:  07/08/2012  REFERRING MD :  EDP PRIMARY SERVICE: PCCM   CHIEF COMPLAINT:  Resp Distress, Hypercarbic , AMS   BRIEF PATIENT DESCRIPTION: 72 yo AAF hx of cardiopulmonary arrest 01/2012 , s/p chronic trach , recurrent admission for trach issues, mucus plugging and HCAP (last admit 05/2012) . Brought to ER for resp distress and altered mental status found to by hypercarbic w/ PCO2 at 73. CCM asked to admit .   SIGNIFICANT EVENTS / STUDIES:  5/17> quiet night, req suctioning x2 only, sput sent for C&S 5/18> calm, talking w/ Passey-Muir   LINES / TUBES: 01/2012 Chronic trach >  CULTURES: 5/17 Sputum Cx >> 5/17 BC x 2 >>  ANTIBIOTICS: 5/17 Vanc > 5/17 Zosyn >   HISTORY OF PRESENT ILLNESS:    72 y.o. female with h/o cardio pulmonary arrest in 01/2012 , s/p trach, s/p recent admission for acute on chronic respirator failure and discharged on 4/20 was brought in today for respiratory distress by EMS.   She was also found to have ?RLL  ASD and hypercarbic with PCo2 at 73 . Lives at home. EMS noted decreased air movement , gave nebs, steroids, mag and placed on BIPAP (capped trach ) .  She is afebrile , WBC slightly elevated at 14K .  Pt says she feels more short of breath than usual.  She is more alert , answers questions and follows commands.  Does appear slightly sleepy/lethargic.  She denies chest pain, n/v, increased edema.  Daughter says over last 2 days with increased cough and confusion  Lives with daughter, [name] (she was at HD this am and on return noticed struggling for breath) . She had taken O2 off, O2 concentraor not working.  [facility] DME .   Pt has a very complicated hx of COPD / OSA / prev SNF pt that had hypercarbic respiratory failure and had issues of recurrent intubatin  That underwent  percutaneous tracheostomy 11/23/11 by Dr. Tyson Alias and discharged 11/25/11.    Had trach dislodgement 01/2012 w/ track closure.  Refused trach -redo but  then suffered asystolic cardiac arrest was intubated and resuscitated. Hospital course complicated by HCAP, decompensated CHF, hypotension, acute on chronic renal failure and post arrest encephalopathy. She underwent re-do tracheostomy on 12/27.  Last echo 01/2012 w/ EF 45-50%, severe LV hypertrophy, LV diffuse hypokinesis, grade 1 diastolic dysfxn. LA mod dilated.    PAST MEDICAL HISTORY :  Past Medical History  Diagnosis Date  . COPD (chronic obstructive pulmonary disease)     on home o2  . Chronic renal insufficiency   . CVA (cerebral infarction)     hx  . Hypertensive heart disease with congestive heart failure   . Chronic cor pulmonale   . Cardiomyopathy of undetermined type 09/07/2011  . Chronic kidney disease (CKD), stage IV (severe)   . Hyperlipdemia   . Obesity (BMI 30-39.9)   . Sleep apnea   . History of gout 09/07/2011  . Heart failure 7/13  . Shortness of breath   . Anemia   . Vascular disease   . Asthma   . Anginal pain   . Hypertension   . Type II or unspecified type diabetes mellitus without mention of complication, not stated as uncontrolled   . Stroke   . GERD (gastroesophageal reflux disease)   . Arthritis   . Bundle branch block   . Thrombocytopenia   .  Acute and chronic respiratory failure    Past Surgical History  Procedure Laterality Date  . Bilateral oophorectomy    . Cardiac catheterization      right heart cath  . Laparoscopic gastrostomy  12/21/2011    Procedure: LAPAROSCOPIC GASTROSTOMY;  Surgeon: Axel Filler, MD;  Location: [facility] OR;  Service: General;  Laterality: N/A;  laparoscopic gastrostomy possible open feeding tube  . Abdominal hysterectomy    . Tracheostomy  11/2011  . Cataracts    . Tracheostomy tube placement  02/18/2012    Procedure: TRACHEOSTOMY;  Surgeon: Serena Colonel, MD;  Location: [facility] OR;  Service: ENT;  Laterality: N/A;   MEDS>> . alteplase  2 mg  Intracatheter Once  . alteplase  2 mg Intracatheter Once  . amLODipine  5 mg Oral Daily  . arformoterol  15 mcg Nebulization BID  . aspirin  81 mg Oral Daily  . budesonide (PULMICORT) nebulizer solution  0.25 mg Nebulization BID  . carvedilol  6.25 mg Oral BID WC  . cloNIDine  0.1 mg Oral BID  . ferrous sulfate  325 mg Oral BID  . furosemide  40 mg Intravenous Once  . heparin  5,000 Units Subcutaneous Q8H  . insulin aspart  0-9 Units Subcutaneous TID WC  . isosorbide mononitrate  30 mg Oral Daily  . metoCLOPramide  5 mg Oral QID  . pantoprazole  40 mg Oral Daily  . piperacillin-tazobactam (ZOSYN)  IV  3.375 g Intravenous Q8H  . potassium chloride  40 mEq Oral Once  . sertraline  100 mg Oral QHS  . simvastatin  20 mg Oral q1800  . sodium chloride  3 mL Intravenous Q12H  . torsemide  20 mg Oral BID  . vancomycin  750 mg Intravenous Q24H   Allergies  Allergen Reactions  . Other Itching    "Wool"  . Sulfonamide Derivatives Hives and Rash     SUBJECTIVE:  Increased SOB   VITAL SIGNS: Temp:  [97.4 F (36.3 C)-99.2 F (37.3 C)] 99.2 F (37.3 C) (05/18 0400) Pulse Rate:  [73-123] 81 (05/18 0836) Resp:  [17-33] 33 (05/18 0836) BP: (114-179)/(37-83) 166/78 mmHg (05/18 0740) SpO2:  [95 %-100 %] 100 % (05/18 0836) FiO2 (%):  [28 %-80 %] 28 % (05/18 0836) Weight:  [79.4 kg (175 lb 0.7 oz)] 79.4 kg (175 lb 0.7 oz) (05/17 1550) HEMODYNAMICS:   VENTILATOR SETTINGS: Vent Mode:  [-]  FiO2 (%):  [28 %-80 %] 28 % INTAKE / OUTPUT: Intake/Output     05/17 0701 - 05/18 0700 05/18 0701 - 05/19 0700   P.O.  240   IV Piggyback 300    Total Intake(mL/kg) 300 (3.8) 240 (3)   Urine (mL/kg/hr) 550    Total Output 550     Net -250 +240          PHYSICAL EXAMINATION: GEN: A/Ox1 , lethargic,no resp distress  HEENT:  [place]/AT,    THROAT-clear, no lesions NECK:  Supple  no JVD; normal carotid impulses w/o bruits;  Trach -midline , dsg clean, Passey-Muir in place RESP  Diminished BS in  bases , no wheezing no accessory muscle use, no dullness to percussion CARD:  RRR, no 2/6 SM   , 1+ peripheral edema, pulses intact, no cyanosis or clubbing. GI:   Soft & nt; nml bowel sounds; no organomegaly or masses detected. Musco: Warm bil, no deformities or joint swelling noted.  Neuro: alert, no focal deficits noted.   Skin: Warm, no lesions or rashes  LABS:  Recent Labs Lab 07/08/12 1050 07/08/12 1111 07/08/12 1334 07/08/12 1342 07/08/12 1425 07/08/12 1434 07/09/12 0459 07/09/12 0615  HGB 10.5*  --  10.6*  --   --   --   --  8.5*  WBC 14.6*  --  9.9  --   --   --   --  5.9  PLT 234  --  182  --   --   --   --  166  NA 142  --   --   --   --   --   --  142  K 3.1*  --   --   --   --   --   --  4.0  CL 103  --   --   --   --   --   --  106  CO2 24  --   --   --   --   --   --  28  GLUCOSE 197*  --   --   --   --   --   --  109*  BUN 20  --   --   --   --   --   --  25*  CREATININE 1.95*  --  2.04*  --   --   --   --  2.27*  CALCIUM 9.7  --   --   --   --   --   --  9.2  AST 14  --   --   --   --   --   --  30  ALT <5  --   --   --   --   --   --  7  ALKPHOS 86  --   --   --   --   --   --  66  BILITOT 0.3  --   --   --   --   --   --  0.2*  PROT 7.0  --   --   --   --   --   --  5.7*  ALBUMIN 3.2*  --   --   --   --   --   --  2.7*  LATICACIDVEN  --   --   --  1.2 0.99  --   --   --   PROCALCITON  --   --   --  0.10  --   --   --   --   PROBNP 8263.0*  --   --   --   --   --   --  9076.0*  PHART  --  7.193*  --   --   --  7.293* 7.313*  --   PCO2ART  --  74.4*  --   --   --  57.6* 58.0*  --   PO2ART  --  63.0*  --   --   --  72.1* 51.4*  --     Recent Labs Lab 07/08/12 1647  GLUCAP 110*    CXR 5/17: Right lower lung atelectasis versus airspace disease.  Cardiomegaly with pulmonary vascular congestion. Left basilar scarring.   CXR 5/18:  Increased left lung Atx...    ASSESSMENT / PLAN:  PULMONARY A : Acute on Chronic Hypercarbic Resp  Failure    Chronic Trach Dependent  Left Lung Atelectasis ?RLL HCAP  P:   Started on Vanc/Zosyn IV  Trach collar for sats >90% ,  consider changing to cuffed trach Pulm Hygiene , add Budesonide/Brovana Nebs Bid, & as needed Albuterol  Increased "pulm toilet" regimen w/ suction/ lavage Given Lasix 40mg  x 1, watch renal function...   CARDIOVASCULAR A: Chronic systolic and diastolic heart failure:  ?mild overload  , elevated BNP  HTN  CAD  P:  Tr BNP  Continue home meds norvasc (lower dose) ,coreg, clonidine,imdur , demadex  Hold hydralazine for now, restart if b/p tr up  Lasix x 1    RENAL A:  CKD stage 4 baseline scr 2.6 -Scr stable  Hypokalemia  P:  Tr Creat Replace K+   GASTROINTESTINAL A: GERD, ?Dysphagia P:   PPI  NPO except for meds NPO until Swallow eval , then proceed w/ diet recs    HEMATOLOGIC A:  Anemia of chronic disease  -hbg stable  P:  Follow cbc  Cont iron rx  Hep sq for DVT    INFECTIOUS A:  ? HCAP w/ RLL aspdz ? Aspiration (afebrile/mild wbc bump)  P:   Check sputum cx  Begin Zosyn /Vanc    ENDOCRINE A:  Hyperglycemia  P:   SSI  Check A1C   NEUROLOGIC A:  Lethargic secondary to hypercarbia +/- infection  P:   Limit sedating rx    Global Issues :  PT is full code  Family conversation : Daughter [name] says she is a full code.     Leanna Battles PCCM 07/09/12 @ 10:02 am

## 2012-07-09 NOTE — Evaluation (Signed)
Clinical/Bedside Swallow Evaluation Patient Details  Name: [name] MRN: [id number] Date of Birth: [birth year]  Today's Date: 07/09/2012 Time: 1500-1530 SLP Time Calculation (min): 30 min  Past Medical History:  Past Medical History  Diagnosis Date  . COPD (chronic obstructive pulmonary disease)     on home o2  . Chronic renal insufficiency   . CVA (cerebral infarction)     hx  . Hypertensive heart disease with congestive heart failure   . Chronic cor pulmonale   . Cardiomyopathy of undetermined type 09/07/2011  . Chronic kidney disease (CKD), stage IV (severe)   . Hyperlipdemia   . Obesity (BMI 30-39.9)   . Sleep apnea   . History of gout 09/07/2011  . Heart failure 7/13  . Shortness of breath   . Anemia   . Vascular disease   . Asthma   . Anginal pain   . Hypertension   . Type II or unspecified type diabetes mellitus without mention of complication, not stated as uncontrolled   . Stroke   . GERD (gastroesophageal reflux disease)   . Arthritis   . Bundle branch block   . Thrombocytopenia   . Acute and chronic respiratory failure    Past Surgical History:  Past Surgical History  Procedure Laterality Date  . Bilateral oophorectomy    . Cardiac catheterization      right heart cath  . Laparoscopic gastrostomy  12/21/2011    Procedure: LAPAROSCOPIC GASTROSTOMY;  Surgeon: Axel Filler, MD;  Location: [facility] OR;  Service: General;  Laterality: N/A;  laparoscopic gastrostomy possible open feeding tube  . Abdominal hysterectomy    . Tracheostomy  11/2011  . Cataracts    . Tracheostomy tube placement  02/18/2012    Procedure: TRACHEOSTOMY;  Surgeon: Serena Colonel, MD;  Location: [facility] OR;  Service: ENT;  Laterality: N/A;   HPI:  72 yo AAF hx of cardiopulmonary arrest 01/2012 , s/p chronic trach , recurrent admission for trach issues, mucus plugging and HCAP (last admit 05/2011) . Brought to ER for resp distress and altered mental status found to by hypercarbic w/ PCO2 at  73. CCM asked to admit .  BSE indicated to assess risk for aspiration and recommend safest, PO diet.    Assessment / Plan / Recommendation Clinical Impression  No outward s/s of aspiration noted with trials of soft solids and thin water by cup/straw.   Recommend to upgrade to dysphagia 3 with thin liquids with full supervision with all meals to ensure safety. No outward s/s of aspiration noted during evaluation but recommend ST to follow briefly in acute care setting for diet tolerance due to current respiratory status increasing risk for aspiration.    Aspiration Risk  Moderate    Diet Recommendation Dysphagia 3 (Mechanical Soft);Thin liquid   Liquid Administration via: Cup;Straw Medication Administration: Whole meds with liquid Supervision: Patient able to self feed;Full supervision/cueing for compensatory strategies Compensations: Slow rate;Small sips/bites Postural Changes and/or Swallow Maneuvers: Seated upright 90 degrees;Upright 30-60 min after meal    Other  Recommendations Oral Care Recommendations: Oral care QID   Follow Up Recommendations  Other (comment) (TBD)    Frequency and Duration min 1 x/week  1 week       SLP Swallow Goals Patient will consume recommended diet without observed clinical signs of aspiration with: Modified independent assistance Patient will utilize recommended strategies during swallow to increase swallowing safety with: Modified independent assistance   Swallow Study Prior Functional Status   Lives with daughter on  regular consistency and thin liquids     General Date of Onset: 07/08/12 HPI: 72 yo AAF hx of cardiopulmonary arrest 01/2012 , s/p chronic trach , recurrent admission for trach issues, mucus plugging and HCAP (last admit 05/2011) . Brought to ER for resp distress and altered mental status found to by hypercarbic w/ PCO2 at 73. CCM asked to admit .  Type of Study: Bedside swallow evaluation Previous Swallow Assessment: 06/06/12 Diet  Prior to this Study: Dysphagia 1 (puree);Thin liquids Temperature Spikes Noted: No Respiratory Status: Trach Trach Size and Type: #6;Uncuffed;With PMSV in place History of Recent Intubation: No Behavior/Cognition: Alert;Cooperative;Pleasant mood Oral Cavity - Dentition: Edentulous Self-Feeding Abilities: Able to feed self;Needs assist Patient Positioning: Upright in bed Baseline Vocal Quality: Clear Volitional Cough: Strong    Oral/Motor/Sensory Function Overall Oral Motor/Sensory Function: Appears within functional limits for tasks assessed   Ice Chips Ice chips: Not tested   Thin Liquid Thin Liquid: Within functional limits Presentation: Cup;Spoon;Straw    Nectar Thick Nectar Thick Liquid: Not tested   Honey Thick Honey Thick Liquid: Not tested   Puree Puree: Within functional limits Presentation: Self Fed;Spoon   Solid   GO    Solid: Within functional limits Presentation: Self Lorretta Harp MS, CCC-SLP [phone] [facility] 07/09/2012,4:06 PM

## 2012-07-09 NOTE — Progress Notes (Addendum)
CRITICAL VALUE ALERT  Critical value received:  CO2 58  Date of notification:  07/09/2012  Time of notification:  0505  Critical value read back:yes  Nurse who received alert:  Maximino Greenland  MD notified (1st page):  MD Z.  Time of first page:  0509  MD notified (2nd page):  Time of second page:  Responding MD:  MD Z.  Time MD responded:  [phone]

## 2012-07-10 DIAGNOSIS — G4733 Obstructive sleep apnea (adult) (pediatric): Secondary | ICD-10-CM

## 2012-07-10 DIAGNOSIS — I509 Heart failure, unspecified: Secondary | ICD-10-CM

## 2012-07-10 LAB — GLUCOSE, CAPILLARY
Glucose-Capillary: 97 mg/dL (ref 70–99)
Glucose-Capillary: 113 mg/dL — ABNORMAL HIGH (ref 70–99)

## 2012-07-10 LAB — PATHOLOGIST SMEAR REVIEW

## 2012-07-10 NOTE — Care Management Note (Signed)
    Page 1 of 2   07/13/2012     11:10:41 AM   CARE MANAGEMENT NOTE 07/13/2012  Patient:  [name], [name]   Account Number:  [id number]  Date Initiated:  07/10/2012  Documentation initiated by:  [name]  Subjective/Objective Assessment:   Pt admitted with acute resp. failure PNA     Action/Plan:   PTA pt lived at home with daughter- active with [facility] for [facility] services- RN/PT/OT/CSW- will need resumption orders at time of discharge   Anticipated DC Date:  07/14/2012   Anticipated DC Plan:  HOME W HOME HEALTH SERVICES      DC Planning Services  CM consult      [facility] Choice  Resumption Of Svcs/PTA Provider   Choice offered to / List presented to:  C-1 Patient        HH arranged  HH-1 RN  HH-10 DISEASE MANAGEMENT  HH-2 PT  HH-3 OT  HH-4 NURSE'S AIDE  HH-6 SOCIAL WORKER      HH agency  [facility]   Status of service:  In process, will continue to follow Medicare Important Message given?  YES (If response is "NO", the following Medicare IM given date fields will be blank) Date Medicare IM given:  07/08/2012 Date Additional Medicare IM given:    Discharge Disposition:  HOME W HOME HEALTH SERVICES  Per UR Regulation:  Reviewed for med. necessity/level of care/duration of stay  If discussed at Long Length of Stay Meetings, dates discussed:    Comments:  1105 07-13-12 Felicia Bamberger, RN,BSN [phone] CM did speak to pt and she is from home with daughter. Pt states she has other family that checks in on her. Pt refusing SNF at this time.  Pt was previously active with [facility] for RN, OT and Aide. Will need new orders for [facility] PT at d/c and resumption orders for RN,OT, Aide. CM did make referral with [facility]. SOC to begin within 24-48 hours post d/c. CM will continue to monitor for disposition needs.

## 2012-07-10 NOTE — Progress Notes (Signed)
Utilization review completed.  

## 2012-07-10 NOTE — Progress Notes (Addendum)
PULMONARY  / CRITICAL CARE MEDICINE  Name: [name] MRN: [id number] DOB: 11-26-1940    ADMISSION DATE:  07/08/2012  REFERRING MD :  EDP PRIMARY SERVICE: PCCM   CHIEF COMPLAINT:  Resp Distress, Hypercarbic , AMS   BRIEF PATIENT DESCRIPTION: 72 yo AAF hx of cardiopulmonary arrest 01/2012 , s/p chronic trach , recurrent admission for trach issues, mucus plugging and HCAP (last admit 05/2012) . Brought to ER for resp distress and altered mental status found to by hypercarbic w/ PCO2 at 73. CCM asked to admit .   SIGNIFICANT EVENTS / STUDIES:  5/17> quiet night, req suctioning x2 only, sput sent for C&S 5/18> calm, talking w/ Passey-Muir  LINES / TUBES: 01/2012 Chronic trach >  CULTURES: 5/17 Sputum Cx >> 5/17 BC x 2 >>  ANTIBIOTICS: 5/17 Vanc > 5/17 Zosyn >  HISTORY OF PRESENT ILLNESS:    72 y.o. female with h/o cardio pulmonary arrest in 01/2012 , s/p trach, s/p recent admission for acute on chronic respirator failure and discharged on 4/20 was brought in today for respiratory distress by EMS.   She was also found to have ?RLL  ASD and hypercarbic with PCo2 at 73 . Lives at home. EMS noted decreased air movement , gave nebs, steroids, mag and placed on BIPAP (capped trach ) .  She is afebrile , WBC slightly elevated at 14K .  Pt says she feels more short of breath than usual.  She is more alert , answers questions and follows commands.  Does appear slightly sleepy/lethargic.  She denies chest pain, n/v, increased edema.  Daughter says over last 2 days with increased cough and confusion  Lives with daughter, [name] (she was at HD this am and on return noticed struggling for breath) . She had taken O2 off, O2 concentraor not working.  [facility] DME .   Pt has a very complicated hx of COPD / OSA / prev SNF pt that had hypercarbic respiratory failure and had issues of recurrent intubatin  That underwent  percutaneous tracheostomy 11/23/11 by Dr. Tyson Alias and discharged 11/25/11.    Had trach dislodgement 01/2012 w/ track closure.  Refused trach -redo but  then suffered asystolic cardiac arrest was intubated and resuscitated. Hospital course complicated by HCAP, decompensated CHF, hypotension, acute on chronic renal failure and post arrest encephalopathy. She underwent re-do tracheostomy on 12/27.  Last echo 01/2012 w/ EF 45-50%, severe LV hypertrophy, LV diffuse hypokinesis, grade 1 diastolic dysfxn. LA mod dilated.   SUBJECTIVE:  No events overnight.  VITAL SIGNS: Temp:  [98.3 F (36.8 C)-99.1 F (37.3 C)] 98.3 F (36.8 C) (05/19 0821) Pulse Rate:  [61-84] 65 (05/19 0954) Resp:  [15-28] 24 (05/19 0954) BP: (124-162)/(56-74) 162/74 mmHg (05/19 0906) SpO2:  [97 %-100 %] 97 % (05/19 0900) FiO2 (%):  [28 %] 28 % (05/19 0954) Weight:  [82.4 kg (181 lb 10.5 oz)-83.9 kg (184 lb 15.5 oz)] 83.9 kg (184 lb 15.5 oz) (05/19 0748) HEMODYNAMICS:   VENTILATOR SETTINGS: Vent Mode:  [-]  FiO2 (%):  [28 %] 28 % INTAKE / OUTPUT: Intake/Output     05/18 0701 - 05/19 0700 05/19 0701 - 05/20 0700   P.O. 1060    I.V. (mL/kg) 410 (5)    IV Piggyback 250    Total Intake(mL/kg) 1720 (20.9)    Urine (mL/kg/hr) 675 (0.3) 300 (1.1)   Total Output 675 300   Net +1045 -300         PHYSICAL EXAMINATION: GEN: A/Ox1 ,  lethargic,no resp distress  HEENT:  [place]/AT,    THROAT-clear, no lesions NECK:  Supple  no JVD; normal carotid impulses w/o bruits;  Trach -midline , dsg clean, Passey-Muir in place RESP  Diminished BS in bases , no wheezing no accessory muscle use, no dullness to percussion CARD:  RRR, no 2/6 SM   , 1+ peripheral edema, pulses intact, no cyanosis or clubbing. GI:   Soft & nt; nml bowel sounds; no organomegaly or masses detected. Musco: Warm bil, no deformities or joint swelling noted.  Neuro: alert, no focal deficits noted.   Skin: Warm, no lesions or rashes  LABS:  Recent Labs Lab 07/08/12 1050 07/08/12 1111 07/08/12 1334 07/08/12 1342 07/08/12 1425  07/08/12 1434 07/09/12 0459 07/09/12 0615  HGB 10.5*  --  10.6*  --   --   --   --  8.5*  WBC 14.6*  --  9.9  --   --   --   --  5.9  PLT 234  --  182  --   --   --   --  166  NA 142  --   --   --   --   --   --  142  K 3.1*  --   --   --   --   --   --  4.0  CL 103  --   --   --   --   --   --  106  CO2 24  --   --   --   --   --   --  28  GLUCOSE 197*  --   --   --   --   --   --  109*  BUN 20  --   --   --   --   --   --  25*  CREATININE 1.95*  --  2.04*  --   --   --   --  2.27*  CALCIUM 9.7  --   --   --   --   --   --  9.2  AST 14  --   --   --   --   --   --  30  ALT <5  --   --   --   --   --   --  7  ALKPHOS 86  --   --   --   --   --   --  66  BILITOT 0.3  --   --   --   --   --   --  0.2*  PROT 7.0  --   --   --   --   --   --  5.7*  ALBUMIN 3.2*  --   --   --   --   --   --  2.7*  LATICACIDVEN  --   --   --  1.2 0.99  --   --   --   PROCALCITON  --   --   --  0.10  --   --   --   --   PROBNP 8263.0*  --   --   --   --   --   --  9076.0*  PHART  --  7.193*  --   --   --  7.293* 7.313*  --   PCO2ART  --  74.4*  --   --   --  57.6* 58.0*  --  PO2ART  --  63.0*  --   --   --  72.1* 51.4*  --     Recent Labs Lab 07/08/12 1647 07/09/12 0730 07/09/12 1221 07/09/12 1826 07/10/12 0817  GLUCAP 110* 109* 133* 101* 97    CXR 5/17: Right lower lung atelectasis versus airspace disease.  Cardiomegaly with pulmonary vascular congestion. Left basilar scarring.   CXR 5/18:  Increased left lung Atx...    ASSESSMENT / PLAN:  PULMONARY A : Acute on Chronic Hypercarbic Resp  Failure  Chronic Trach Dependent  Left Lung Atelectasis ?RLL HCAP  P:   - Continue on Vanc/Zosyn IV. - Trach collar for sats >90%, consider changing to cuffed trach if aspiration  - Pulm Hygiene, added Budesonide/Brovana Nebs Bid, & as needed Albuterol  - Increased "pulm toilet" regimen w/ suction/ lavage - Hold further diureses given renal function.  CARDIOVASCULAR A: Chronic systolic and  diastolic heart failure:  ?mild overload  , elevated BNP  HTN  CAD  P:  - Tr BNP. - Continue home meds norvasc (lower dose) ,coreg, clonidine,imdur and demadex. - Restart hydralazine.  RENAL A:  CKD stage 4 baseline scr 2.6 -Scr stable  Hypokalemia  P:  - Tr Creat - Replace K+ as needed. - Hold further diureses for now to allow for renal function to normalize.  GASTROINTESTINAL A: GERD, ?Dysphagia P:   - PPI. - Dysphagia 3 diet as recommended by speech.  HEMATOLOGIC A:  Anemia of chronic disease  -hbg stable  P:  - Follow cbc. - Cont iron rx. - Hep sq for DVT.  INFECTIOUS A:  ? HCAP w/ RLL aspdz ? Aspiration (afebrile/mild wbc bump)  P:   - Check sputum cx. - Continue Zosyn Clement Husbands.  ENDOCRINE A:  Hyperglycemia  P:   - SSI.  NEUROLOGIC A:  Lethargic secondary to hypercarbia +/- infection  P:   - Limit sedating rx   Global Issues :  PT is full code  Family conversation : Daughter [name] says she is a full code.   Will have PT/OT assist, advance diet per speech pathology, continue abx, will recheck in AM.  Alyson Reedy, M.D. [facility] Pulmonary/Critical Care Medicine. Pager: [phone]. After hours pager: [phone].

## 2012-07-11 ENCOUNTER — Inpatient Hospital Stay (HOSPITAL_COMMUNITY): Payer: Medicare Other

## 2012-07-11 DIAGNOSIS — I472 Ventricular tachycardia, unspecified: Secondary | ICD-10-CM

## 2012-07-11 DIAGNOSIS — R7989 Other specified abnormal findings of blood chemistry: Secondary | ICD-10-CM

## 2012-07-11 DIAGNOSIS — I509 Heart failure, unspecified: Secondary | ICD-10-CM

## 2012-07-11 DIAGNOSIS — J9610 Chronic respiratory failure, unspecified whether with hypoxia or hypercapnia: Secondary | ICD-10-CM

## 2012-07-11 LAB — CBC
HCT: 26.2 % — ABNORMAL LOW (ref 36.0–46.0)
MCH: 25 pg — ABNORMAL LOW (ref 26.0–34.0)
MCHC: 30.5 g/dL (ref 30.0–36.0)
MCHC: 30.6 g/dL (ref 30.0–36.0)
MCV: 81.9 fL (ref 78.0–100.0)
Platelets: 186 10*3/uL (ref 150–400)
Platelets: 193 10*3/uL (ref 150–400)
RDW: 17.1 % — ABNORMAL HIGH (ref 11.5–15.5)
RDW: 17.1 % — ABNORMAL HIGH (ref 11.5–15.5)
WBC: 6.6 10*3/uL (ref 4.0–10.5)
WBC: 6.7 10*3/uL (ref 4.0–10.5)

## 2012-07-11 LAB — BASIC METABOLIC PANEL
BUN: 30 mg/dL — ABNORMAL HIGH (ref 6–23)
Calcium: 8.5 mg/dL (ref 8.4–10.5)
Calcium: 8.5 mg/dL (ref 8.4–10.5)
Creatinine, Ser: 2.5 mg/dL — ABNORMAL HIGH (ref 0.50–1.10)
Creatinine, Ser: 2.51 mg/dL — ABNORMAL HIGH (ref 0.50–1.10)
GFR calc Af Amer: 21 mL/min — ABNORMAL LOW (ref >90)
GFR calc Af Amer: 21 mL/min — ABNORMAL LOW (ref >90)
GFR calc non Af Amer: 18 mL/min — ABNORMAL LOW (ref >90)
Sodium: 138 mEq/L (ref 135–145)

## 2012-07-11 LAB — CULTURE, RESPIRATORY W GRAM STAIN

## 2012-07-11 LAB — GLUCOSE, CAPILLARY: Glucose-Capillary: 96 mg/dL (ref 70–99)

## 2012-07-11 LAB — TROPONIN I: Troponin I: 1.13 ng/mL (ref <0.30)

## 2012-07-11 LAB — MAGNESIUM
Magnesium: 1.9 mg/dL (ref 1.5–2.5)
Magnesium: 2 mg/dL (ref 1.5–2.5)

## 2012-07-11 MED ORDER — SODIUM CHLORIDE 0.9 % IJ SOLN
INTRAMUSCULAR | Status: AC
  Administered 2012-07-11: 10:00:00
  Filled 2012-07-11: qty 20

## 2012-07-11 NOTE — Progress Notes (Signed)
PT Cancellation Note  Patient Details Name: TAURIEL SCRONCE MRN: [id number] DOB: [birth year]   Cancelled Treatment:    Reason Eval/Treat Not Completed: Other (comment) (pt refused OOB due to reports of being too SOB.  )  PT to check back tomorrow.     Rollene Rotunda Elma Shands, PT, DPT [phone]   07/11/2012, 4:44 PM

## 2012-07-11 NOTE — Progress Notes (Signed)
I was called by the bedside nurse, the patient with 5 beats NSVT   Repeat electrolytes and EKG.

## 2012-07-11 NOTE — Progress Notes (Signed)
Refused to get oob.  Report called to receiving nurse 3W.  No complaints.

## 2012-07-11 NOTE — Progress Notes (Signed)
In to reposition patient and refuse to turn.  States she gets sob when moved.  Still on right side and refuse to be turned. Again explained about pressure ulcer to sacrum and possible to lt trochanter but refused to turn from left side. Continue to monitor.

## 2012-07-11 NOTE — Consult Note (Signed)
CARDIOLOGY CONSULT NOTE   [name] MRN: [id number] DOB/AGE: June 11, 1940 72 y.o. Admit date: 07/08/2012  Referring Physician:  Dr. Frederico Hamman (E-Link) Primary Cardiologist: Dr. [name]  Reason for consultation:  Elevated troponin  HPI:  Pt is a 72 yo woman with multiple medical issues, chronic resp failure, trach, HTN, obesity, CKD, diastolic and systolic HF who presented with respiratory failure and hypercarbia, currently being treated for PNA and CHF exacerbation.  Pt had short run of NSVT on tele and Troponins were checked and found to be elevated, so cardiology was consulted.  At this time, the pt reports continued SOB, but says this is improved compared to admission.  She denies ever having any chest pain.  She has not had any prior stress testing done.  She has never had a cardiac cath.  She lives with her daughter who helps take care of her.  She had LE edema on admission, she says this is also improved since admission.  She is eating and drinking well and has a good appetite.  She has no other acute complaints.  Review of systems: A review of 10 organ systems was done and is negative except as stated above in HPI  Past Medical History  Diagnosis Date  . COPD (chronic obstructive pulmonary disease)     on home o2  . Chronic renal insufficiency   . CVA (cerebral infarction)     hx  . Hypertensive heart disease with congestive heart failure   . Chronic cor pulmonale   . Cardiomyopathy of undetermined type 09/07/2011  . Chronic kidney disease (CKD), stage IV (severe)   . Hyperlipdemia   . Obesity (BMI 30-39.9)   . Sleep apnea   . History of gout 09/07/2011  . Heart failure 7/13  . Shortness of breath   . Anemia   . Vascular disease   . Asthma   . Anginal pain   . Hypertension   . Type II or unspecified type diabetes mellitus without mention of complication, not stated as uncontrolled   . Stroke   . GERD (gastroesophageal reflux disease)   . Arthritis   .  Bundle branch block   . Thrombocytopenia   . Acute and chronic respiratory failure    Past Surgical History  Procedure Laterality Date  . Bilateral oophorectomy    . Cardiac catheterization      right heart cath  . Laparoscopic gastrostomy  12/21/2011    Procedure: LAPAROSCOPIC GASTROSTOMY;  Surgeon: Axel Filler, MD;  Location[facility] OR;  Service: General;  Laterality: N/A;  laparoscopic gastrostomy possible open feeding tube  . Abdominal hysterectomy    . Tracheostomy  11/2011  . Cataracts    . Tracheostomy tube placement  02/18/2012    Procedure: TRACHEOSTOMY;  Surgeon: Serena Colonel, MD;  Location: [facility] OR;  Service: ENT;  Laterality: N/A;   History   Social History  . Marital Status: Widowed    Spouse Name: N/A    Number of Children: 1  . Years of Education: N/A   Occupational History  . Not on file.   Social History Main Topics  . Smoking status: Former Smoker -- 1.00 packs/day for 55 years    Types: Cigarettes    Quit date: 08/28/2011  . Smokeless tobacco: Never Used  . Alcohol Use: No  . Drug Use: No  . Sexually Active: No   Other Topics Concern  . Not on file   Social History Narrative   Widow,  lives alone  Family History  Problem Relation Age of Onset  . Heart attack Father   . Stroke Mother     CVA  . Coronary artery disease Daughter      Allergies  Allergen Reactions  . Other Itching    "Wool"  . Sulfonamide Derivatives Hives and Rash    Prescriptions prior to admission  Medication Sig Dispense Refill  . albuterol (PROVENTIL) (5 MG/ML) 0.5% nebulizer solution Take 0.5 mLs (2.5 mg total) by nebulization every 4 (four) hours as needed for wheezing.  20 mL  0  . amLODipine (NORVASC) 10 MG tablet Take 10 mg by mouth daily.      . Artificial Saliva (BIOTENE MOISTURIZING MOUTH) SOLN Use as directed 1 spray in the mouth or throat daily.      . Ascorbic Acid (VITAMIN C PO) Take 1 tablet by mouth daily.      Marland Kitchen aspirin 81 MG chewable tablet Chew 1  tablet (81 mg total) by mouth daily.      . busPIRone (BUSPAR) 5 MG tablet Take 5 mg by mouth 2 (two) times daily.      . carvedilol (COREG) 6.25 MG tablet Take 1 tablet (6.25 mg total) by mouth 2 (two) times daily with a meal.  60 tablet  1  . cloNIDine (CATAPRES) 0.1 MG tablet Take 0.1 mg by mouth 2 (two) times daily.      . hydrALAZINE (APRESOLINE) 100 MG tablet Take 100 mg by mouth 3 (three) times daily.      . isosorbide mononitrate (IMDUR) 30 MG 24 hr tablet Take 1 tablet (30 mg total) by mouth daily.  30 tablet  1  . levofloxacin (LEVAQUIN) 250 MG tablet Take 1 tablet (250 mg total) by mouth daily.  3 tablet  0  . metoCLOPramide (REGLAN) 5 MG tablet Take 5 mg by mouth 4 (four) times daily.      Marland Kitchen oxycodone (OXY-IR) 5 MG capsule Take 5 mg by mouth every 6 (six) hours as needed (for moderate pain).      . pantoprazole (PROTONIX) 20 MG tablet Take 20 mg by mouth every evening.      . pravastatin (PRAVACHOL) 40 MG tablet Take 40 mg by mouth daily.      . sertraline (ZOLOFT) 100 MG tablet Take 100 mg by mouth at bedtime.      . torsemide (DEMADEX) 20 MG tablet Take 20 mg by mouth 2 (two) times daily.        Current facility-administered medications:0.9 %  sodium chloride infusion, 250 mL, Intravenous, PRN, [name] S Parrett, NP;  acetaminophen (TYLENOL) tablet 650 mg, 650 mg, Oral, Q6H PRN, Lonia Farber, MD, 650 mg at 07/11/12 1306;  albuterol (PROVENTIL) (5 MG/ML) 0.5% nebulizer solution 2.5 mg, 2.5 mg, Nebulization, Q4H PRN, [name] S Parrett, NP;  amLODipine (NORVASC) tablet 5 mg, 5 mg, Oral, Daily, [name] S Parrett, NP, 5 mg at 07/11/12 0934 arformoterol (BROVANA) nebulizer solution 15 mcg, 15 mcg, Nebulization, BID, [name] S Parrett, NP, 15 mcg at 07/11/12 1949;  aspirin chewable tablet 81 mg, 81 mg, Oral, Daily, [name] S Parrett, NP, 81 mg at 07/11/12 0934;  budesonide (PULMICORT) nebulizer solution 0.25 mg, 0.25 mg, Nebulization, BID, [name] S Parrett, NP, 0.25 mg at 07/11/12 1949;   carvedilol (COREG) tablet 6.25 mg, 6.25 mg, Oral, BID WC, [name] S Parrett, NP, 6.25 mg at 07/11/12 1706 cloNIDine (CATAPRES) tablet 0.1 mg, 0.1 mg, Oral, BID, [name] S Parrett, NP, 0.1 mg at 07/11/12 2226;  ferrous sulfate EC  tablet 325 mg, 325 mg, Oral, BID, [name] S Parrett, NP, 325 mg at 07/11/12 2226;  fluconazole (DIFLUCAN) tablet 100 mg, 100 mg, Oral, Daily, Michele Mcalpine, MD, 100 mg at 07/11/12 0935;  furosemide (LASIX) injection 40 mg, 40 mg, Intravenous, Once, [name] S Parrett, NP heparin injection 5,000 Units, 5,000 Units, Subcutaneous, Q8H, [name] S Parrett, NP, 5,000 Units at 07/11/12 2226;  insulin aspart (novoLOG) injection 0-9 Units, 0-9 Units, Subcutaneous, TID WC, [name] S Parrett, NP, 1 Units at 07/11/12 1706;  isosorbide mononitrate (IMDUR) 24 hr tablet 30 mg, 30 mg, Oral, Daily, [name] S Parrett, NP, 30 mg at 07/11/12 0935 metoCLOPramide (REGLAN) tablet 5 mg, 5 mg, Oral, QID, [name] S Parrett, NP, 5 mg at 07/11/12 2226;  oxyCODONE (Oxy IR/ROXICODONE) immediate release tablet 5 mg, 5 mg, Oral, Q6H PRN, Lupita Leash, MD, 5 mg at 07/11/12 1223;  pantoprazole (PROTONIX) EC tablet 40 mg, 40 mg, Oral, Daily, [name] S Parrett, NP, 40 mg at 07/11/12 0934 piperacillin-tazobactam (ZOSYN) IVPB 3.375 g, 3.375 g, Intravenous, Q8H, Gildardo Cranker Grimsley, RPH, 3.375 g at 07/11/12 1436;  potassium chloride SA (K-DUR,KLOR-CON) CR tablet 40 mEq, 40 mEq, Oral, Once, [name] S Parrett, NP;  sertraline (ZOLOFT) tablet 100 mg, 100 mg, Oral, QHS, [name] S Parrett, NP, 100 mg at 07/11/12 2226;  simvastatin (ZOCOR) tablet 20 mg, 20 mg, Oral, q1800, [name] S Parrett, NP, 20 mg at 07/11/12 1706 sodium chloride 0.9 % injection 10-40 mL, 10-40 mL, Intracatheter, PRN, Lupita Leash, MD, 10 mL at 07/11/12 2022;  torsemide (DEMADEX) tablet 20 mg, 20 mg, Oral, BID, [name] S Parrett, NP, 20 mg at 07/11/12 2226;  vancomycin (VANCOCIN) IVPB 750 mg/150 ml premix, 750 mg, Intravenous, Q24H, Gildardo Cranker Grimsley, RPH, 750 mg at  07/11/12 2254  Physical Exam: Blood pressure 149/60, pulse 61, temperature 97.6 F (36.4 C), temperature source Oral, resp. rate 20, height 5\' 4"  (1.626 m), weight 83.6 kg (184 lb 4.9 oz), SpO2 92.00%.; Body mass index is 31.62 kg/(m^2). Temp:  [97.6 F (36.4 C)-98.7 F (37.1 C)] 97.6 F (36.4 C) (05/20 2052) Pulse Rate:  [52-62] 61 (05/20 2052) Resp:  [18-33] 20 (05/20 1949) BP: (127-166)/(46-72) 149/60 mmHg (05/20 2052) SpO2:  [88 %-100 %] 92 % (05/20 2052) FiO2 (%):  [28 %] 28 % (05/20 2052) Weight:  [83.6 kg (184 lb 4.9 oz)] 83.6 kg (184 lb 4.9 oz) (05/20 0358)   Intake/Output Summary (Last 24 hours) at 07/11/12 2334 Last data filed at 07/11/12 1900  Gross per 24 hour  Intake    640 ml  Output   1400 ml  Net   -760 ml   General: NAD Heent: MMM Neck: trach in place  CV: RRR, no m  Lungs: Clear to auscultation bilaterally anteriorly Abdomen: Soft, nontender, nondistended Extremities: No clubbing or cyanosis.  No pedal edema Skin: Intact without lesions or rashes  Neurologic: Alert and oriented, grossly nonfocal  Psych: Normal mood and affect    Labs:  Recent Labs  07/11/12 2024  TROPONINI 1.13*   Lab Results  Component Value Date   WBC 6.7 07/11/2012   HGB 8.2* 07/11/2012   HCT 26.8* 07/11/2012   MCV 82.5 07/11/2012   PLT 193 07/11/2012    Recent Labs Lab 07/09/12 0615  07/11/12 2024  NA 142  < > 138  K 4.0  < > 4.5  CL 106  < > 103  CO2 28  < > 25  BUN 25*  < > 30*  CREATININE 2.27*  < >  2.51*  CALCIUM 9.2  < > 8.5  PROT 5.7*  --   --   BILITOT 0.2*  --   --   ALKPHOS 66  --   --   ALT 7  --   --   AST 30  --   --   GLUCOSE 109*  < > 143*  < > = values in this interval not displayed. Lab Results  Component Value Date   CHOL 124 12/29/2011   HDL 30* 12/29/2011   LDLCALC 68 12/29/2011   TRIG 129 12/29/2011       EKG:  Sinus, RBBB, LAD, LVH with repol/nonspec ST changes  Echo: 12/13 - Normal LV size with severe LV hypertrophy. EF 45-50%  with global hypokinesis. Normal RV size and systoilc function. No significant valvular abnormalities.   Radiology:  Dg Chest Port 1 View  07/11/2012   *RADIOLOGY REPORT*  Clinical Data: Left lower lobe atelectasis.  PORTABLE CHEST - 1 VIEW  Comparison: 05/18 and 07/08/2012 and 06/05/2047  Findings: Increasing atelectasis/infiltrate at the left lung base. Pulmonary vascular prominence of the right lung base.  Tracheostomy tube is unchanged.  IMPRESSION:    Increasing infiltrate/atelectasis at the left lung base.   Original Report Authenticated By: Francene Boyers, M.D.    ASSESSMENT: Pt is a 72 yo woman with multiple medical issues, chronic resp failure, trach, HTN, obesity, CKD, diastolic and systolic HF who presented with respiratory failure and hypercarbia, currently being treated for PNA and CHF exacerbation.  Pt has short run of NSVT on tele and Troponins were checked and found to be elevated, so cardiology was consulted  PLAN:  Elevated troponin - Pt denies any chest pain, but continues to be SOB.  She is being treated for PNA and CHF.  EKG shows nonspec changes.  Differential diagnosis includes demand ischemia from respiratory illness/anemia/heart failure/severe LVH vs ACS.  Could also consider PE on the differential.  Continue to treat the underlying conditions as you are.  Please continue to trend enzymes.  We will check daily EKGs.  Given renal failure and multiple comorbidities, would like to manage this medically.  It may be difficult for her to tolerate a stress test given her current resp status and her renal failure precludes cath at this time, unless she would be willing to accept the risk of possibly needing HD after the cath.  Will start heparin for now while enzymes are trending.  Continue home aspirin, BB, statin.  Regarding the NSVT, her elytes are in range and she is already on BB, will monitor.     Thank you for this consultation.  Recs relayed to E-link - they confirm no  contraindications to starting heparin, orders placed.  Will continue to follow.  Signed: Hilary Hertz, MD Cardiology Fellow 07/11/2012, 11:34 PM

## 2012-07-11 NOTE — Progress Notes (Signed)
ANTIBIOTIC CONSULT NOTE - FOLLOW UP  Pharmacy Consult for Vancomycin and Zosyn Indication: rule out pneumonia  Allergies  Allergen Reactions  . Other Itching    "Wool"  . Sulfonamide Derivatives Hives and Rash    Patient Measurements: Height: 5\' 4"  (162.6 cm) Weight: 184 lb 4.9 oz (83.6 kg) IBW/kg (Calculated) : 54.7  Vital Signs: Temp: 98.3 F (36.8 C) (05/20 0358) Temp src: Oral (05/20 0358) BP: 157/56 mmHg (05/20 0935) Pulse Rate: 60 (05/20 0913) Intake/Output from previous day: 05/19 0701 - 05/20 0700 In: 370 [I.V.:20; IV Piggyback:350] Out: 1550 [Urine:1550] Intake/Output from this shift: Total I/O In: 20 [I.V.:20] Out: -   Labs:  Recent Labs  07/08/12 1334 07/09/12 0615 07/11/12 0420  WBC 9.9 5.9 6.6  HGB 10.6* 8.5* 8.0*  PLT 182 166 186  CREATININE 2.04* 2.27* 2.50*   Estimated Creatinine Clearance: 21.3 ml/min (by C-G formula based on Cr of 2.5).    Microbiology: 5/17 Blood>>ngtd 5/17 Resp>>normal flora  5/17 Vanc>> 5/17 Zosyn>>  Assessment: 72 yo F started on empiric abx for presumed HCAP.  Pt is from home with a chronic trach and multiple recent hospital admissions.  Pt is clinically improving with WBC and PCT trending down.  Pt is afebrile.  SCr continues to fluctuate between 2-2.5 (baseline CKD with SCr 2).  Goal of Therapy:  Vancomycin trough level 15-20 mcg/ml  Plan:  Continue Vancomycin 750 mg IV q24 hours. Continue Zosyn 3.375 gm IV q8h (4 hour infusion). Consider Vancomycin trough 5/21 or 5/22 if broad spectrum therapy to continue.  [employer], Pharm.D., BCPS Clinical Pharmacist Pager [phone] 07/11/2012 11:36 AM

## 2012-07-11 NOTE — Progress Notes (Signed)
PT Cancellation Note  Patient Details Name: WYNNE ROZAK MRN: [id number] DOB: [birth year]   Cancelled Treatment:    Reason Eval/Treat Not Completed: Fatigue/lethargy limiting ability to participate; pt still SOB and just s/p bath earlier this am.  Will plan to attempt again this date as time permits.   WYNN,CYNDI 07/11/2012, 12:59 PM

## 2012-07-11 NOTE — Progress Notes (Signed)
PULMONARY  / CRITICAL CARE MEDICINE  Name: [name] MRN: [id number] DOB: 1940/09/03    ADMISSION DATE:  07/08/2012  REFERRING MD :  EDP PRIMARY SERVICE: PCCM   CHIEF COMPLAINT:  Resp Distress, Hypercarbic , AMS   BRIEF PATIENT DESCRIPTION: 72 yo AAF hx of cardiopulmonary arrest 01/2012 , s/p chronic trach , recurrent admission for trach issues, mucus plugging and HCAP (last admit 05/2012) . Brought to ER for resp distress and altered mental status found to by hypercarbic w/ PCO2 at 73. CCM asked to admit .   SIGNIFICANT EVENTS / STUDIES:  5/17> quiet night, req suctioning x2 only, sput sent for C&S 5/18> calm, talking w/ Passey-Muir  LINES / TUBES: 01/2012 Chronic trach >  CULTURES: 5/17 Sputum Cx >> 5/17 BC x 2 >>  ANTIBIOTICS: 5/17 Vanc > 5/17 Zosyn >  HISTORY OF PRESENT ILLNESS:    72 y.o. female with h/o cardio pulmonary arrest in 01/2012 , s/p trach, s/p recent admission for acute on chronic respirator failure and discharged on 4/20 was brought in today for respiratory distress by EMS.   She was also found to have ?RLL  ASD and hypercarbic with PCo2 at 73 . Lives at home. EMS noted decreased air movement , gave nebs, steroids, mag and placed on BIPAP (capped trach ) .  She is afebrile , WBC slightly elevated at 14K .  Pt says she feels more short of breath than usual.  She is more alert , answers questions and follows commands.  Does appear slightly sleepy/lethargic.  She denies chest pain, n/v, increased edema.  Daughter says over last 2 days with increased cough and confusion  Lives with daughter, [name] (she was at HD this am and on return noticed struggling for breath) . She had taken O2 off, O2 concentraor not working.  [facility] D[facility] .   Pt has a very complicated hx of COPD / OSA / prev SNF pt that had hypercarbic respiratory failure and had issues of recurrent intubatin  That underwent  percutaneous tracheostomy 11/23/11 by Dr. Tyson Alias and discharged 11/25/11.    Had trach dislodgement 01/2012 w/ track closure.  Refused trach -redo but  then suffered asystolic cardiac arrest was intubated and resuscitated. Hospital course complicated by HCAP, decompensated CHF, hypotension, acute on chronic renal failure and post arrest encephalopathy. She underwent re-do tracheostomy on 12/27.  Last echo 01/2012 w/ EF 45-50%, severe LV hypertrophy, LV diffuse hypokinesis, grade 1 diastolic dysfxn. LA mod dilated.   SUBJECTIVE:  No events overnight.  VITAL SIGNS: Temp:  [98.3 F (36.8 C)-98.8 F (37.1 C)] 98.3 F (36.8 C) (05/20 0358) Pulse Rate:  [58-64] 60 (05/20 0913) Resp:  [22-33] 26 (05/20 0913) BP: (129-166)/(56-81) 157/56 mmHg (05/20 0935) SpO2:  [96 %-100 %] 100 % (05/20 0913) FiO2 (%):  [28 %] 28 % (05/20 0913) Weight:  [83.6 kg (184 lb 4.9 oz)] 83.6 kg (184 lb 4.9 oz) (05/20 0358) HEMODYNAMICS:   VENTILATOR SETTINGS: Vent Mode:  [-]  FiO2 (%):  [28 %] 28 % INTAKE / OUTPUT: Intake/Output     05/19 0701 - 05/20 0700 05/20 0701 - 05/21 0700   P.O.     I.V. (mL/kg) 20 (0.2) 20 (0.2)   IV Piggyback 350    Total Intake(mL/kg) 370 (4.4) 20 (0.2)   Urine (mL/kg/hr) 1550 (0.8)    Total Output 1550     Net -1180 +20        Urine Occurrence 1 x     PHYSICAL  EXAMINATION: GEN: A/Ox1 , lethargic,no resp distress  HEENT:  [place]/AT,    THROAT-clear, no lesions NECK:  Supple  no JVD; normal carotid impulses w/o bruits;  Trach -midline , dsg clean, Passey-Muir in place RESP  Diminished BS in bases , no wheezing no accessory muscle use, no dullness to percussion CARD:  RRR, no 2/6 SM   , 1+ peripheral edema, pulses intact, no cyanosis or clubbing. GI:   Soft & nt; nml bowel sounds; no organomegaly or masses detected. Musco: Warm bil, no deformities or joint swelling noted.  Neuro: alert, no focal deficits noted.   Skin: Warm, no lesions or rashes  LABS:  Recent Labs Lab 07/08/12 1050 07/08/12 1111 07/08/12 1334 07/08/12 1342 07/08/12 1425  07/08/12 1434 07/09/12 0459 07/09/12 0615 07/11/12 0420  HGB 10.5*  --  10.6*  --   --   --   --  8.5* 8.0*  WBC 14.6*  --  9.9  --   --   --   --  5.9 6.6  PLT 234  --  182  --   --   --   --  166 186  NA 142  --   --   --   --   --   --  142 140  K 3.1*  --   --   --   --   --   --  4.0 3.9  CL 103  --   --   --   --   --   --  106 103  CO2 24  --   --   --   --   --   --  28 25  GLUCOSE 197*  --   --   --   --   --   --  109* 97  BUN 20  --   --   --   --   --   --  25* 30*  CREATININE 1.95*  --  2.04*  --   --   --   --  2.27* 2.50*  CALCIUM 9.7  --   --   --   --   --   --  9.2 8.5  MG  --   --   --   --   --   --   --   --  1.9  PHOS  --   --   --   --   --   --   --   --  3.6  AST 14  --   --   --   --   --   --  30  --   ALT <5  --   --   --   --   --   --  7  --   ALKPHOS 86  --   --   --   --   --   --  66  --   BILITOT 0.3  --   --   --   --   --   --  0.2*  --   PROT 7.0  --   --   --   --   --   --  5.7*  --   ALBUMIN 3.2*  --   --   --   --   --   --  2.7*  --   LATICACIDVEN  --   --   --  1.2 0.99  --   --   --   --  PROCALCITON  --   --   --  0.10  --   --   --   --   --   PROBNP 8263.0*  --   --   --   --   --   --  9076.0*  --   PHART  --  7.193*  --   --   --  7.293* 7.313*  --   --   PCO2ART  --  74.4*  --   --   --  57.6* 58.0*  --   --   PO2ART  --  63.0*  --   --   --  72.1* 51.4*  --   --     Recent Labs Lab 07/10/12 0817 07/10/12 1220 07/10/12 1643 07/10/12 2148 07/11/12 0717  GLUCAP 97 113* 118* 112* 96    CXR 5/17: Right lower lung atelectasis versus airspace disease.  Cardiomegaly with pulmonary vascular congestion. Left basilar scarring.   CXR 5/18:  Increased left lung Atx...    ASSESSMENT / PLAN:  PULMONARY A : Acute on Chronic Hypercarbic Resp  Failure  Chronic Trach Dependent  Left Lung Atelectasis ?RLL HCAP  P:   - Continue on Vanc/Zosyn IV. - Trach collar for sats >90%, consider changing to cuffed trach if aspiration  -  Pulm Hygiene, added Budesonide/Brovana Nebs Bid, & as needed Albuterol  - Increased "pulm toilet" regimen w/ suction/ lavage - Hold further diureses given renal function. - CXR today to check status of LLL atelectasis.  CARDIOVASCULAR A: Chronic systolic and diastolic heart failure:  ?mild overload  , elevated BNP  HTN  CAD  P:  - Tr BNP. - Continue home meds norvasc (lower dose) ,coreg, clonidine,imdur and demadex.  RENAL A:  CKD stage 4 baseline scr 2.6 -Scr stable  Hypokalemia  P:  - Tr Creat - Replace K+ as needed. - Hold further diureses for now to allow for renal function to normalize to baseline.  GASTROINTESTINAL A: GERD, ?Dysphagia P:   - PPI. - Dysphagia 3 diet as recommended by speech.  HEMATOLOGIC A:  Anemia of chronic disease  -hbg stable  P:  - Follow cbc. - Cont iron rx. - Hep sq for DVT.  INFECTIOUS A:  ? HCAP w/ RLL aspdz ? Aspiration (afebrile/mild wbc bump)  P:   - Check sputum cx, NTD. - Continue Zosyn /Vanc.  ENDOCRINE A:  Hyperglycemia  P:   - SSI.  NEUROLOGIC A:  Lethargic secondary to hypercarbia +/- infection  P:   - Limit sedating rx   Global Issues :  PT is full code  Family conversation : Daughter [name] says she is a full code.   Transfer to tele, transfer care to [facility], PCCM will sign off, please call back if needed.  Alyson Reedy, M.D. [facility] Pulmonary/Critical Care Medicine. Pager: [phone]. After hours pager: [phone].

## 2012-07-11 NOTE — Progress Notes (Signed)
SLP Cancellation Note  Patient Details Name: [name] MRN: [id number] DOB: 13-Mar-1940   Cancelled treatment:       Reason Eval/Treat Not Completed: Medical issues which prohibited therapy. Pt nauseated and vomiting. Refused POs for check of diet tolerance. SLP will f/u tomorrow.   Harlon Ditty, MA CCC-SLP [phone]  [name] 07/11/2012, 2:27 PM

## 2012-07-11 NOTE — Progress Notes (Signed)
Patient had 5 beats of VTach.  Asymptomatic, denies any pain.  BP 127/69.  Dr. Frederico Hamman paged and notified.  New orders received.  Will continue to monitor.  Night nurse Jodene Nam updated.  Colman Cater

## 2012-07-11 NOTE — Progress Notes (Addendum)
NSVT   Electrolytes reviewed and normal.  EKG with non specific ST abnormalities; the patient without chest pain; Troponin elevated at 1.1.   Continue cycling enzymes;   Cardiology consult called;

## 2012-07-11 NOTE — Progress Notes (Signed)
Repositioned patient to right side and suctioned for small amt of thin clear secretions.  Patient states she cannot lie on right side.  Explained she has scarring on sacrum from prev pressure ulcer and could open up.  States she will turn when she feels better.

## 2012-07-12 ENCOUNTER — Inpatient Hospital Stay (HOSPITAL_COMMUNITY): Payer: Medicare Other

## 2012-07-12 DIAGNOSIS — E669 Obesity, unspecified: Secondary | ICD-10-CM

## 2012-07-12 DIAGNOSIS — J45909 Unspecified asthma, uncomplicated: Secondary | ICD-10-CM

## 2012-07-12 LAB — BASIC METABOLIC PANEL
BUN: 30 mg/dL — ABNORMAL HIGH (ref 6–23)
CO2: 29 mEq/L (ref 19–32)
Calcium: 8.3 mg/dL — ABNORMAL LOW (ref 8.4–10.5)
Chloride: 102 mEq/L (ref 96–112)
Creatinine, Ser: 2.41 mg/dL — ABNORMAL HIGH (ref 0.50–1.10)
GFR calc Af Amer: 22 mL/min — ABNORMAL LOW (ref >90)

## 2012-07-12 LAB — CBC
HCT: 27.4 % — ABNORMAL LOW (ref 36.0–46.0)
MCH: 25.5 pg — ABNORMAL LOW (ref 26.0–34.0)
MCV: 82.3 fL (ref 78.0–100.0)
Platelets: 188 10*3/uL (ref 150–400)
RDW: 17.1 % — ABNORMAL HIGH (ref 11.5–15.5)
WBC: 6.6 10*3/uL (ref 4.0–10.5)

## 2012-07-12 LAB — GLUCOSE, CAPILLARY: Glucose-Capillary: 136 mg/dL — ABNORMAL HIGH (ref 70–99)

## 2012-07-12 LAB — TROPONIN I: Troponin I: 1.39 ng/mL (ref <0.30)

## 2012-07-12 MED ORDER — HEPARIN BOLUS VIA INFUSION
2000.0000 [IU] | Freq: Once | INTRAVENOUS | Status: AC
  Administered 2012-07-12: 2000 [IU] via INTRAVENOUS
  Filled 2012-07-12: qty 2000

## 2012-07-12 MED ORDER — ALBUTEROL SULFATE (5 MG/ML) 0.5% IN NEBU
2.5000 mg | INHALATION_SOLUTION | Freq: Three times a day (TID) | RESPIRATORY_TRACT | Status: DC
  Administered 2012-07-12 – 2012-07-17 (×15): 2.5 mg via RESPIRATORY_TRACT
  Filled 2012-07-12 (×15): qty 0.5

## 2012-07-12 MED ORDER — HEPARIN (PORCINE) IN NACL 100-0.45 UNIT/ML-% IJ SOLN
1000.0000 [IU]/h | INTRAMUSCULAR | Status: DC
  Administered 2012-07-12 (×2): 1000 [IU]/h via INTRAVENOUS
  Filled 2012-07-12 (×3): qty 250

## 2012-07-12 MED ORDER — ALBUTEROL SULFATE (5 MG/ML) 0.5% IN NEBU: 2.5000 mg | INHALATION_SOLUTION | RESPIRATORY_TRACT | Status: DC | PRN

## 2012-07-12 NOTE — Progress Notes (Signed)
Appreciate cardiology assistance.  Troponin trended down so will discontinue additional troponins.  Defer management of heparin gtt and antiplatelet agents to cards.

## 2012-07-12 NOTE — Progress Notes (Signed)
CRITICAL VALUE ALERT  Critical value received:  Troponin 1.13  Date of notification:  07/11/12  Time of notification:  2135  Critical value read back:yes  Nurse who received alert:  Jodene Nam RN  MD notified (1st page):  Dr. Frederico Hamman  Time of first page:  2138  Responding MD:  Dr. Mervyn Gay  Time MD responded:  2139  No new orders. Will continue to monitor.

## 2012-07-12 NOTE — Progress Notes (Signed)
ANTICOAGULATION CONSULT NOTE - Follow Up Consult  Pharmacy Consult for Heparin Indication: chest pain/ACS  Allergies  Allergen Reactions  . Other Itching    "Wool"  . Sulfonamide Derivatives Hives and Rash    Patient Measurements: Height: 5\' 4"  (162.6 cm) Weight: 183 lb 13.8 oz (83.4 kg) IBW/kg (Calculated) : 54.7 Heparin Dosing Weight: 70 kg  Vital Signs: BP: 175/77 mmHg (05/21 0641) Pulse Rate: 60 (05/21 0909)  Labs:  Recent Labs  07/11/12 0420 07/11/12 2024 07/12/12 0230 07/12/12 0800 07/12/12 0820  HGB 8.0* 8.2* 8.5*  --   --   HCT 26.2* 26.8* 27.4*  --   --   PLT 186 193 188  --   --   HEPARINUNFRC  --   --   --   --  0.65  CREATININE 2.50* 2.51* 2.41*  --   --   TROPONINI  --  1.13* 1.39* 1.42*  --     Estimated Creatinine Clearance: 22.1 ml/min (by C-G formula based on Cr of 2.41).   Medications:  Scheduled:  . amLODipine  5 mg Oral Daily  . arformoterol  15 mcg Nebulization BID  . aspirin  81 mg Oral Daily  . budesonide (PULMICORT) nebulizer solution  0.25 mg Nebulization BID  . carvedilol  6.25 mg Oral BID WC  . cloNIDine  0.1 mg Oral BID  . ferrous sulfate  325 mg Oral BID  . fluconazole  100 mg Oral Daily  . furosemide  40 mg Intravenous Once  . insulin aspart  0-9 Units Subcutaneous TID WC  . isosorbide mononitrate  30 mg Oral Daily  . metoCLOPramide  5 mg Oral QID  . pantoprazole  40 mg Oral Daily  . piperacillin-tazobactam (ZOSYN)  IV  3.375 g Intravenous Q8H  . potassium chloride  40 mEq Oral Once  . sertraline  100 mg Oral QHS  . simvastatin  20 mg Oral q1800  . torsemide  20 mg Oral BID  . vancomycin  750 mg Intravenous Q24H   Infusions:  . heparin 1,000 Units/hr (07/12/12 0113)    Assessment: 72 yo F on heparin infusion for new SOB and elevated troponin.  Heparin level is therapeutic at 0.65 on 1000 units/hr.  Awaiting cardiac plans re: stress test vs. cath.  Goal of Therapy:  Heparin level 0.3-0.7 units/ml Monitor platelets  by anticoagulation protocol: Yes   Plan:  Continue heparin at 1000 units/hr. Check heparin level at 1400 to confirm therapeutic. Continue daily heparin level and CBC. Follow-up cardiac plans.  [employer], Pharm.D., BCPS Clinical Pharmacist Pager [phone] 07/12/2012 10:25 AM

## 2012-07-12 NOTE — Progress Notes (Signed)
Speech Language Pathology Dysphagia Treatment Patient Details Name: [name] MRN: [id number] DOB: [birth year] Today's Date: 07/12/2012 Time: 1425-     Assessment / Plan / Recommendation Clinical Impression  Pt. denies dysphagia, stating, "I don't have any trouble swallowing.  I just wish ya'll would stop worrying me."  Pt. took sips of water via straw without overt s/s of aspiration.  Pt. reports she ate her lunch without difficulty.  There was a slight cough noted at end of session, but pt, reports the cough had nothing to do with her swallow.  Will d/c ST for now.  If further questions re: possible aspiration, recommend an objective evaluation (MBS).    Diet Recommendation  Continue with Current Diet: Dysphagia 3 (mechanical soft);Thin liquid    SLP Plan Discharge SLP treatment due to (comment);All goals met   Pertinent Vitals/Pain n/a   Swallowing Goals     General Temperature Spikes Noted: No Respiratory Status: Trach Behavior/Cognition: Alert;Cooperative Oral Cavity - Dentition: Edentulous Patient Positioning: Upright in bed  Oral Cavity - Oral Hygiene Does patient have any of the following "at risk" factors?: Oxygen therapy - cannula, mask, simple oxygen devices Patient is AT RISK - Oral Care Protocol followed (see row info): Yes   Dysphagia Treatment Treatment focused on: Skilled observation of diet tolerance;Patient/family/caregiver education Family/Caregiver Educated: Patient Treatment Methods/Modalities: Skilled observation Patient observed directly with PO's: Yes Type of PO's observed: Thin liquids Feeding: Able to feed self Liquids provided via: Straw Pharyngeal Phase Signs & Symptoms: Delayed cough Type of cueing: Verbal Amount of cueing: Minimal   GO     Maryjo Rochester T 07/12/2012, 2:54 PM

## 2012-07-12 NOTE — Progress Notes (Signed)
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 RHC 09/13/11: mild pulmonary hypertension, otherwise normal right heart pressures 2D echo 01/28/12: EF 45-50%, grade 1 diastolic dysfunction, severe LVH, diffuse HK, mild AS (mean grad 10 mmHg, peak 20), trivial MR, moderate biatrial enlargement, normal RV size and function  She has been admitted 7 times in the past year for similar issues. She has been seen by the heart failure team last fall. At that time, pBNP was markedly elevated and she was massively volume overloaded. She underwent RHC showing only mild pulmonary HTN believed to be attributed to her chronic pulmonary disease. She was diuresed with symptom improvement, since then she has been treated for her underlying, progressively worsening pulmonary status. She had an asystolic cardiac arrest in Jan 2014 in the setting of acute on chronic resp failure (mucous plug from trach). Ischemic work-up has been deferred due to worsening CKD.   Cardiology was consulted overnight for NSVT and positive troponin, 1.13. ECG does not show any acute ST-T wave abnormalities and is unchanged from previous. Potassium 4.5. Magnesium 2.0. [name] is asymptomatic and denies CP, palpitations or dizziness. She has chronic SOB but feels this is improving since admission with treatment for PNA and HF.    Objective  Physical Exam: Vitals: BP 175/77  Pulse 60  Temp(Src) 97.6 F (36.4 C) (Oral)  Resp 19  Ht 5\' 4"  (1.626 m)  Wt 183 lb 13.8 oz (83.4 kg)   BMI 31.54 kg/m2  SpO2 96% General: Well developed, chronically ill appearing 72 year old female in no acute distress. Neck: Supple. Trach collar in place. Lungs: Diminished breath sounds but clear bilaterally to auscultation without wheezes, rales, or rhonchi when auscultated anteriorly. Breathing is unlabored. Heart: RRR S1 S2 without murmurs, rubs, or gallops.  Abdomen: Soft, non-distended. Extremities: No clubbing or cyanosis. No edema.  Distal pedal pulses are 2+ and equal bilaterally. Neuro: Alert and oriented X 3. Moves all extremities spontaneously. No focal deficits.  Intake/Output:  Intake/Output Summary (Last 24 hours) at 07/12/12 1157 Last data filed at 07/12/12 0650  Gross per 24 hour  Intake    310 ml  Output    850 ml  Net   -540 ml    Inpatient Medications:  . amLODipine  5 mg Oral Daily  . arformoterol  15 mcg Nebulization BID  . aspirin  81 mg Oral Daily  . budesonide (PULMICORT) nebulizer solution  0.25 mg Nebulization BID  . carvedilol  6.25 mg Oral BID WC  . cloNIDine  0.1 mg Oral BID  . ferrous sulfate  325 mg Oral BID  . fluconazole  100 mg Oral Daily  . furosemide  40 mg Intravenous Once  . insulin aspart  0-9 Units Subcutaneous TID WC  . isosorbide mononitrate  30 mg Oral Daily  . metoCLOPramide  5 mg Oral QID  . pantoprazole  40 mg Oral Daily  . piperacillin-tazobactam (ZOSYN)  IV  3.375 g Intravenous Q8H  . potassium chloride  40 mEq Oral Once  . sertraline  100  mg Oral QHS  . simvastatin  20 mg Oral q1800  . torsemide  20 mg Oral BID  . vancomycin  750 mg Intravenous Q24H   . heparin 1,000 Units/hr (07/12/12 0113)    Labs:  Recent Labs  07/11/12 0420 07/11/12 2024 07/12/12 0230  NA 140 138 138  K 3.9 4.5 4.2  CL 103 103 102  CO2 25 25 29   GLUCOSE 97 143* 84  BUN 30* 30* 30*  CREATININE 2.50* 2.51* 2.41*  CALCIUM 8.5 8.5 8.3*  MG 1.9 2.0 1.8  PHOS 3.6  --  4.2    Recent Labs  07/11/12 2024 07/12/12 0230  WBC 6.7 6.6  HGB  8.2* 8.5*  HCT 26.8* 27.4*  MCV 82.5 82.3  PLT 193 188    Recent Labs  07/11/12 2024 07/12/12 0230 07/12/12 0800  TROPONINI 1.13* 1.39* 1.42*    ProBNP 9076 (up from 1851 on 05/15/2012)   Radiology/Studies: Dg Chest Port 1 View  07/12/2012   *RADIOLOGY REPORT*  Clinical Data: Follow up left lower lobe process.  PORTABLE CHEST - 1 VIEW  Comparison: 07/11/2012.  Findings: The tracheostomy tube is stable.  The heart is enlarged but unchanged.  There is persistent left lower lobe opacity, likely a combination of atelectasis, infiltrate and effusion.  Persistent right basilar atelectasis also.  IMPRESSION: Stable chest x-ray findings.   Original Report Authenticated By: Rudie Meyer, M.D.   Dg Chest Port 1 View  07/11/2012   *RADIOLOGY REPORT*  Clinical Data: Left lower lobe atelectasis.  PORTABLE CHEST - 1 VIEW  Comparison: 05/18 and 07/08/2012 and 06/05/2047  Findings: Increasing atelectasis/infiltrate at the left lung base. Pulmonary vascular prominence of the right lung base.  Tracheostomy tube is unchanged.  IMPRESSION:    Increasing infiltrate/atelectasis at the left lung base.   Original Report Authenticated By: Francene Boyers, M.D.    Echocardiogram: Dec 2013 Study Conclusions - Left ventricle: The cavity size was normal. Wall thickness was increased in a pattern of severe LVH. Systolic function was mildly reduced. The estimated ejection fraction was in the range of 45% to 50%. Diffuse hypokinesis. Doppler parameters are consistent with abnormal left ventricular relaxation (grade 1 diastolic dysfunction). - Aortic valve: Trileaflet; moderately calcified leaflets. There was very mild stenosis. Mean gradient: 10mm Hg (S). Peak gradient: 20mm Hg (S). - Mitral valve: Mildly calcified annulus. Trivial regurgitation. - Left atrium: The atrium was moderately dilated. - Right ventricle: The cavity size was normal. Systolic function was normal. - Right atrium: The atrium was  moderately dilated. - Pulmonary arteries: No complete TR doppler jet so unable to estimate PA systolic pressure. - Systemic veins: IVC measured 1.8 cm with normal respirophasic variation suggesting RA pressure 6-10 mmHg. Impression: - Normal LV size with severe LV hypertrophy. EF 45-50% with global hypokinesis. Normal RV size and systoilc function. No significant valvular abnormalities.   Telemetry: SR; brief NSVT overnight    Assessment and Plan  1. Elevated troponin, NSVT 2. HCAP 3. Acute on chronic respiratory failure 4. Chronic diastolic HF with severe LVH 5. HTN 6. Acute on chronic renal failure 7. Anemia  Most likely represents NSTEMI/demand ischemia from respiratory failure/PNA/anemia/HF/severe LVH. Given renal failure and multiple comorbidities, not a good candidate for cardiac cath/invasive measures and recommend medical Rx for presumed CAD unless she develops symptoms of CP, worsening SOB with dynamic ECG changes. Continue ASA, BB, Imdur and statin. Follow telemetry. Follow troponin to document trending down. ? need to repeat echo - MD to advise. Will  follow with you.   Signed, EDMISTEN, BROOKE PA-C  Difficult situation   Will track enzymes pt is adamant about not wanting dialysis>>brother husband and daughter all Med Rx--will prob need plavix >>> Last tronponin elevation was only about 0.4 in 9/13 so levels now are striking and with the multiple other presentations with similar issues and the lack of troponin elevation, suspect acs is real  Continue hep for 4-5 days Continue betablockers Agreer with echo

## 2012-07-12 NOTE — Progress Notes (Signed)
Physical Therapy Evaluation Patient Details Name: [name] MRN: [id number] DOB: [birth year] Today's Date: 07/12/2012 Time: 1191-4782 PT Time Calculation (min): 16 min  PT Assessment / Plan / Recommendation Clinical Impression  Pt s/p PNA, CP and ACS with decr mobility secondary to poor endurance and poor tolerance of actiivity.  Will beenfit from PT to address mobility and assist pt with return to prior functional level as pt was modif I PTA.      PT Assessment  Patient needs continued PT services    Follow Up Recommendations  CIR;Supervision/Assistance - 24 hour        Barriers to Discharge Decreased caregiver support Stated she was alone at times PTA.  May need NHP initially for rehab or rehab    Equipment Recommendations  None recommended by PT    Recommendations for Other Services Rehab consult   Frequency Min 3X/week    Precautions / Restrictions Precautions Precautions: Fall Precaution Comments: Trach collar with PMV Restrictions Weight Bearing Restrictions: No   Pertinent Vitals/Pain VSS, No pain      Mobility  Bed Mobility Bed Mobility: Scooting to HOB;Rolling Right;Rolling Left Rolling Right: 3: Mod assist Rolling Left: 3: Mod assist Scooting to HOB: 1: +2 Total assist Scooting to [facility]: Patient Percentage: 0% Details for Bed Mobility Assistance: Pt began moving and stated she just can't do it today.  PT assisted her up in bed as she was not positioned well on arrival.   Transfers Transfers: Not assessed Ambulation/Gait Ambulation/Gait Assistance: Not tested (comment) Stairs: No Wheelchair Mobility Wheelchair Mobility: No         PT Diagnosis: Generalized weakness  PT Problem List: Decreased strength;Decreased activity tolerance;Decreased balance;Decreased mobility;Decreased knowledge of use of DME;Decreased safety awareness;Decreased knowledge of precautions PT Treatment Interventions: DME instruction;Gait training;Functional mobility  training;Therapeutic activities;Therapeutic exercise;Balance training;Neuromuscular re-education;Patient/family education   PT Goals Acute Rehab PT Goals PT Goal Formulation: With patient Time For Goal Achievement: 07/26/12 Potential to Achieve Goals: Good Pt will go Supine/Side to Sit: with supervision PT Goal: Supine/Side to Sit - Progress: Goal set today Pt will Sit at Edge of Bed: with supervision;3-5 min;with bilateral upper extremity support PT Goal: Sit at Edge Of Bed - Progress: Goal set today Pt will go Sit to Stand: with min assist;with upper extremity assist PT Goal: Sit to Stand - Progress: Goal set today Pt will Transfer Bed to Chair/Chair to Bed: with min assist PT Transfer Goal: Bed to Chair/Chair to Bed - Progress: Goal set today Pt will Perform Home Exercise Program: with supervision, verbal cues required/provided PT Goal: Perform Home Exercise Program - Progress: Goal set today  Visit Information  Last PT Received On: 07/12/12 Assistance Needed: +2    Subjective Data  Subjective: "I really don't feel like doing much today." Patient Stated Goal: to go home   Prior Functioning  Home Living Lives With: Daughter Available Help at Discharge: Family;Personal care attendant;Available PRN/intermittently (Pt states she was alone at times PTA) Type of Home: House Home Access: Ramped entrance Home Layout: One level Home Adaptive Equipment: Wheelchair - manual Additional Comments: Pt began answering questions and states she was Modif I with transfers PTA.  She answered a few other things and then stated she just couldn't answer anything else today.   Prior Function Level of Independence: Independent with assistive device(s);Needs assistance Needs Assistance: Bathing;Dressing;Meal Prep;Light Housekeeping Bath: Minimal Dressing: Minimal Meal Prep: Total Light Housekeeping: Total Able to Take Stairs?: No Driving: No Vocation: Retired Musician:  Scientific laboratory technician  Cognition  Cognition Arousal/Alertness: Awake/alert Behavior During Therapy: WFL for tasks assessed/performed Overall Cognitive Status: Within Functional Limits for tasks assessed    Extremity/Trunk Assessment Right Lower Extremity Assessment RLE ROM/Strength/Tone: [facility] for tasks assessed;Unable to fully assess (due to pt refusal to move too much) Left Lower Extremity Assessment LLE ROM/Strength/Tone: [facility] for tasks assessed;Unable to fully assess (due to patient's refusal to move too much)   Balance    End of Session PT - End of Session Equipment Utilized During Treatment: Oxygen Activity Tolerance: Patient limited by fatigue Patient left: in bed;with call bell/phone within reach;with bed alarm set Nurse Communication: Mobility status;Need for lift equipment       [name],Kealy Lewter 07/12/2012, 10:43 AM  [facility] [phone] (pager)

## 2012-07-12 NOTE — Progress Notes (Signed)
ANTICOAGULATION CONSULT NOTE - Initial Consult  Pharmacy Consult for Heparin Indication: chest pain/ACS  Allergies  Allergen Reactions  . Other Itching    "Wool"  . Sulfonamide Derivatives Hives and Rash    Patient Measurements: Height: 5\' 4"  (162.6 cm) Weight: 184 lb 4.9 oz (83.6 kg) IBW/kg (Calculated) : 54.7 Heparin Dosing Weight: 70 kg  Vital Signs: Temp: 97.6 F (36.4 C) (05/20 2052) Temp src: Oral (05/20 2052) BP: 149/60 mmHg (05/20 2052) Pulse Rate: 55 (05/20 2352)  Labs:  Recent Labs  07/09/12 0615 07/11/12 0420 07/11/12 2024  HGB 8.5* 8.0* 8.2*  HCT 27.5* 26.2* 26.8*  PLT 166 186 193  CREATININE 2.27* 2.50* 2.51*  TROPONINI  --   --  1.13*    Estimated Creatinine Clearance: 21.2 ml/min (by C-G formula based on Cr of 2.51).   Medical History: Past Medical History  Diagnosis Date  . COPD (chronic obstructive pulmonary disease)     on home o2  . Chronic renal insufficiency   . CVA (cerebral infarction)     hx  . Hypertensive heart disease with congestive heart failure   . Chronic cor pulmonale   . Cardiomyopathy of undetermined type 09/07/2011  . Chronic kidney disease (CKD), stage IV (severe)   . Hyperlipdemia   . Obesity (BMI 30-39.9)   . Sleep apnea   . History of gout 09/07/2011  . Heart failure 7/13  . Shortness of breath   . Anemia   . Vascular disease   . Asthma   . Anginal pain   . Hypertension   . Type II or unspecified type diabetes mellitus without mention of complication, not stated as uncontrolled   . Stroke   . GERD (gastroesophageal reflux disease)   . Arthritis   . Bundle branch block   . Thrombocytopenia   . Acute and chronic respiratory failure     Medications:  Scheduled:  . amLODipine  5 mg Oral Daily  . arformoterol  15 mcg Nebulization BID  . aspirin  81 mg Oral Daily  . budesonide (PULMICORT) nebulizer solution  0.25 mg Nebulization BID  . carvedilol  6.25 mg Oral BID WC  . cloNIDine  0.1 mg Oral BID  .  ferrous sulfate  325 mg Oral BID  . fluconazole  100 mg Oral Daily  . furosemide  40 mg Intravenous Once  . heparin  5,000 Units Subcutaneous Q8H  . insulin aspart  0-9 Units Subcutaneous TID WC  . isosorbide mononitrate  30 mg Oral Daily  . metoCLOPramide  5 mg Oral QID  . pantoprazole  40 mg Oral Daily  . piperacillin-tazobactam (ZOSYN)  IV  3.375 g Intravenous Q8H  . potassium chloride  40 mEq Oral Once  . sertraline  100 mg Oral QHS  . simvastatin  20 mg Oral q1800  . torsemide  20 mg Oral BID  . vancomycin  750 mg Intravenous Q24H    Assessment: 72 yo female with SOB/CHF, now with elevated cardiac markers, for heparin.   Goal of Therapy:  Heparin level 0.3-0.7 units/ml Monitor platelets by anticoagulation protocol: Yes   Plan:  D/C SQ heparin (last dose at 2230) Heparin 2000 units IV bolus, then 1000 units.hr Follow-up am labs.   Brentley Horrell, Gary Fleet 07/12/2012,12:32 AM

## 2012-07-12 NOTE — Progress Notes (Signed)
transfered patient one assist to the bed side commode. Had a large BM. Transferred pt back to bed. Pt slightly sob tolerated ok. Will continue to monitor.

## 2012-07-12 NOTE — Progress Notes (Addendum)
[facility] PROGRESS NOTE  KALLA WATSON ZOX:[id number] DOB: 02/14/1941 DOA: 07/08/2012 PCP: Gwynneth Aliment, MD  72 yo AAF hx of cardiopulmonary arrest 01/2012 , s/p chronic trach , recurrent admission for trach issues, mucus plugging and HCAP (last admit 05/2012) . Brought to ER for resp distress and altered mental status found to by hypercarbic w/ PCO2 at 73. CCM asked to admit on 5/17.  Was started on broad spectrum antibiotics and was suctioned with improvement in dyspnea.    Assessment/Plan  Acute on Chronic Hypercarbic Resp Failure due to HCAP and acute on chronic diastolic heart failure Chronic Trach Dependent  Left Lung Atelectasis  ? HCAP  - Continue on Vanc/Zosyn IV day 5/7 - Trach collar for sats >90%, consider changing to cuffed trach if aspiration  - Pulm Hygiene, added Budesonide/Brovana Nebs Bid, & as needed Albuterol  - Continue "pulm toilet" regimen w/ suction/ lavage  - Hold further diureses given renal function.  - CXR demonstrates persistent LLL opacity.  Rising troponins in the setting of HCAP and mild CHF with acute on chronic respiratory failure.  Likely heart strain according to cardiology, however, they also started her on a heparin gtt. -  Will continue cycling troponins as still rising and still on heparin gtt -  Continue heparin gtt per cardiology -  On beta blocker, aspirin, statin  Chronic systolic and diastolic heart failure: ?mild overload, elevated BNP, HTN.   Last echo 01/2012 w/ EF 45-50%, severe LV hypertrophy, LV diffuse hypokinesis, grade 1 diastolic dysfxn. LA mod dilated.  - Continue home meds norvasc (lower dose) ,coreg, clonidine,imdur and demadex, asa -  -2L over the last two days -  Continue gentle diuresis.    CKD stage 4 baseline scr 2.6 -Scr stable  - continue torsemide  GERD, dysphagia, stable.  Continue PPI and dysphagia 3  Anemia of chronic disease and iron deficiency, stable.  Continue iron  Hyperglycemia, resolved,  likely stress reaction from acute illness. - d/c SSI and CBG  Lethargy due to infection and hypercapnea -  Minimize sedating medications  Diet:  Dysphagia 3 with thin liquids, aspiration precautions Access:  PICC line IVF:  OFF Proph:  Heparin gtt  Code Status: full code  Family Communication: spoke with patient alone  Disposition Plan: pending improvement in dyspnea   SIGNIFICANT EVENTS / STUDIES:  5/17> quiet night, req suctioning x2 only, sput sent for C&S  5/18> calm, talking w/ Passey-Muir  LINES / TUBES:  01/2012 Chronic trach >  CULTURES:  5/17 Sputum Cx >>  5/17 BC x 2 >>    Consultants:  Cardiology  PCCM  SIGNIFICANT EVENTS / STUDIES:  5/17> quiet night, req suctioning x2 only, sput sent for C&S  5/18> calm, talking w/ Passey-Muir   ANTIBIOTICS:  5/17 Vanc >  5/17 Zosyn >   HPI/Subjective:  Denies chest pain, but has had some chest tightness.  Denies wheezing, but feels Alona Danford of breath and has been coughing up white phlegm.  Unsure if swelling is hte same worse or better than baseline.  Has had some nausea and vomiting of white phlegm.  Denies constipation, diarrhea.  Has foley catheter.     Objective: Filed Vitals:   07/11/12 2352 07/12/12 0407 07/12/12 0641 07/12/12 0909  BP:   175/77   Pulse: 55 57 58 60  Temp:      TempSrc:      Resp: 20 20 20 19   Height:      Weight:   83.4 kg (183  lb 13.8 oz)   SpO2: 95% 98% 95% 96%    Intake/Output Summary (Last 24 hours) at 07/12/12 1225 Last data filed at 07/12/12 0650  Gross per 24 hour  Intake    310 ml  Output    850 ml  Net   -540 ml   Filed Weights   07/10/12 0748 07/11/12 0358 07/12/12 0641  Weight: 83.9 kg (184 lb 15.5 oz) 83.6 kg (184 lb 4.9 oz) 83.4 kg (183 lb 13.8 oz)    Exam:   General:  AAF, moderate respiratory distress, tachypneic with some SCM retractions and has to rest for 10-15 seconds in between each sentence  HEENT:  NCAT, MMM, trach in place with 5 L   Cardiovascular:   RRR, nl S1, S2 no mrg, 2+ pulses, warm extremities  Respiratory:  CTA on the right with diminished BS at the left base.  NO wheezes, rales, or rhonchi.  Abdomen:   NABS, soft, NT/ND  MSK:   Normal tone and bulk, 1+ LEE  Neuro:  Grossly intact  Data Reviewed: Basic Metabolic Panel:  Recent Labs Lab 07/08/12 1050 07/08/12 1334 07/09/12 0615 07/11/12 0420 07/11/12 2024 07/12/12 0230  NA 142  --  142 140 138 138  K 3.1*  --  4.0 3.9 4.5 4.2  CL 103  --  106 103 103 102  CO2 24  --  28 25 25 29   GLUCOSE 197*  --  109* 97 143* 84  BUN 20  --  25* 30* 30* 30*  CREATININE 1.95* 2.04* 2.27* 2.50* 2.51* 2.41*  CALCIUM 9.7  --  9.2 8.5 8.5 8.3*  MG  --   --   --  1.9 2.0 1.8  PHOS  --   --   --  3.6  --  4.2   Liver Function Tests:  Recent Labs Lab 07/08/12 1050 07/09/12 0615  AST 14 30  ALT <5 7  ALKPHOS 86 66  BILITOT 0.3 0.2*  PROT 7.0 5.7*  ALBUMIN 3.2* 2.7*   No results found for this basename: LIPASE, AMYLASE,  in the last 168 hours  Recent Labs Lab 07/08/12 1050  AMMONIA 41   CBC:  Recent Labs Lab 07/08/12 1050 07/08/12 1334 07/09/12 0615 07/11/12 0420 07/11/12 2024 07/12/12 0230  WBC 14.6* 9.9 5.9 6.6 6.7 6.6  NEUTROABS 6.1  --   --   --   --   --   HGB 10.5* 10.6* 8.5* 8.0* 8.2* 8.5*  HCT 35.1* 34.8* 27.5* 26.2* 26.8* 27.4*  MCV 83.2 82.9 81.6 81.9 82.5 82.3  PLT 234 182 166 186 193 188   Cardiac Enzymes:  Recent Labs Lab 07/11/12 2024 07/12/12 0230 07/12/12 0800  TROPONINI 1.13* 1.39* 1.42*   BNP (last 3 results)  Recent Labs  06/04/12 0932 07/08/12 1050 07/09/12 0615  PROBNP 8639.0* 8263.0* 9076.0*   CBG:  Recent Labs Lab 07/11/12 1154 07/11/12 1640 07/11/12 2247 07/12/12 0723 07/12/12 1135  GLUCAP 126* 137* 108* 72 103*    Recent Results (from the past 240 hour(s))  CULTURE, BLOOD (ROUTINE X 2)     Status: None   Collection Time    07/08/12 11:40 AM      Result Value Range Status   Specimen Description BLOOD  LEFT ARM   Final   Special Requests BOTTLES DRAWN AEROBIC AND ANAEROBIC 10CC   Final   Culture  Setup Time 07/08/2012 19:00   Final   Culture     Final   Value:  BLOOD CULTURE RECEIVED NO GROWTH TO DATE CULTURE WILL BE HELD FOR 5 DAYS BEFORE ISSUING A FINAL NEGATIVE REPORT   Report Status PENDING   Incomplete  CULTURE, BLOOD (ROUTINE X 2)     Status: None   Collection Time    07/08/12 12:00 PM      Result Value Range Status   Specimen Description BLOOD LEFT HAND   Final   Special Requests BOTTLES DRAWN AEROBIC ONLY 10CC   Final   Culture  Setup Time 07/08/2012 19:00   Final   Culture     Final   Value:        BLOOD CULTURE RECEIVED NO GROWTH TO DATE CULTURE WILL BE HELD FOR 5 DAYS BEFORE ISSUING A FINAL NEGATIVE REPORT   Report Status PENDING   Incomplete  MRSA PCR SCREENING     Status: None   Collection Time    07/08/12  4:26 PM      Result Value Range Status   MRSA by PCR NEGATIVE  NEGATIVE Final   Comment:            The GeneXpert MRSA Assay (FDA     approved for NASAL specimens     only), is one component of a     comprehensive MRSA colonization     surveillance program. It is not     intended to diagnose MRSA     infection nor to guide or     monitor treatment for     MRSA infections.  CULTURE, RESPIRATORY (NON-EXPECTORATED)     Status: None   Collection Time    07/09/12  8:58 AM      Result Value Range Status   Specimen Description TRACHEAL ASPIRATE   Final   Special Requests NONE   Final   Gram Stain     Final   Value: MODERATE WBC PRESENT, PREDOMINANTLY PMN     NO SQUAMOUS EPITHELIAL CELLS SEEN     NO ORGANISMS SEEN   Culture Non-Pathogenic Oropharyngeal-type Flora Isolated.   Final   Report Status 07/11/2012 FINAL   Final     Studies: Dg Chest Port 1 View  07/12/2012   *RADIOLOGY REPORT*  Clinical Data: Follow up left lower lobe process.  PORTABLE CHEST - 1 VIEW  Comparison: 07/11/2012.  Findings: The tracheostomy tube is stable.  The heart is enlarged  but unchanged.  There is persistent left lower lobe opacity, likely a combination of atelectasis, infiltrate and effusion.  Persistent right basilar atelectasis also.  IMPRESSION: Stable chest x-ray findings.   Original Report Authenticated By: Rudie Meyer, M.D.   Dg Chest Port 1 View  07/11/2012   *RADIOLOGY REPORT*  Clinical Data: Left lower lobe atelectasis.  PORTABLE CHEST - 1 VIEW  Comparison: 05/18 and 07/08/2012 and 06/05/2047  Findings: Increasing atelectasis/infiltrate at the left lung base. Pulmonary vascular prominence of the right lung base.  Tracheostomy tube is unchanged.  IMPRESSION:    Increasing infiltrate/atelectasis at the left lung base.   Original Report Authenticated By: Francene Boyers, M.D.    Scheduled Meds: . amLODipine  5 mg Oral Daily  . arformoterol  15 mcg Nebulization BID  . aspirin  81 mg Oral Daily  . budesonide (PULMICORT) nebulizer solution  0.25 mg Nebulization BID  . carvedilol  6.25 mg Oral BID WC  . cloNIDine  0.1 mg Oral BID  . ferrous sulfate  325 mg Oral BID  . fluconazole  100 mg Oral Daily  . furosemide  40 mg  Intravenous Once  . insulin aspart  0-9 Units Subcutaneous TID WC  . isosorbide mononitrate  30 mg Oral Daily  . metoCLOPramide  5 mg Oral QID  . pantoprazole  40 mg Oral Daily  . piperacillin-tazobactam (ZOSYN)  IV  3.375 g Intravenous Q8H  . potassium chloride  40 mEq Oral Once  . sertraline  100 mg Oral QHS  . simvastatin  20 mg Oral q1800  . torsemide  20 mg Oral BID  . vancomycin  750 mg Intravenous Q24H   Continuous Infusions: . heparin 1,000 Units/hr (07/12/12 0113)    Principal Problem:   HCAP (healthcare-associated pneumonia) Active Problems:   Type 2 diabetes mellitus with vascular disease   Obesity (BMI 30-39.9)   Chronic cor pulmonale   Chronic kidney disease (CKD), stage IV (severe)   Chronic respiratory failure   Acute-on-chronic respiratory failure   Tracheostomy status    Time spent: 30 min    Joyous Gleghorn,  [facility]  [facility] Pager [phone]. If 7PM-7AM, please contact night-coverage at [email or web address], password [facility] 07/12/2012, 12:25 PM  LOS: 4 days

## 2012-07-12 NOTE — Progress Notes (Signed)
ANTICOAGULATION CONSULT NOTE - Follow Up Consult  Pharmacy Consult for Heparin Indication: chest pain/ACS  Allergies  Allergen Reactions  . Other Itching    "Wool"  . Sulfonamide Derivatives Hives and Rash    Patient Measurements: Height: 5\' 4"  (162.6 cm) Weight: 183 lb 13.8 oz (83.4 kg) IBW/kg (Calculated) : 54.7 Heparin Dosing Weight: 70 kg  Vital Signs: Temp: 98.1 F (36.7 C) (05/21 2100) BP: 190/81 mmHg (05/21 2100) Pulse Rate: 62 (05/21 2100)  Labs:  Recent Labs  07/11/12 0420  07/11/12 2024 07/12/12 0230 07/12/12 0800 07/12/12 0820 07/12/12 1815 07/12/12 2110  HGB 8.0*  --  8.2* 8.5*  --   --   --   --   HCT 26.2*  --  26.8* 27.4*  --   --   --   --   PLT 186  --  193 188  --   --   --   --   HEPARINUNFRC  --   --   --   --   --  0.65  --  0.61  CREATININE 2.50*  --  2.51* 2.41*  --   --   --   --   TROPONINI  --   < > 1.13* 1.39* 1.42*  --  0.87*  --   < > = values in this interval not displayed.  Estimated Creatinine Clearance: 22.1 ml/min (by C-G formula based on Cr of 2.41).   Medications:  Scheduled:  . albuterol  2.5 mg Nebulization TID  . amLODipine  5 mg Oral Daily  . arformoterol  15 mcg Nebulization BID  . aspirin  81 mg Oral Daily  . budesonide (PULMICORT) nebulizer solution  0.25 mg Nebulization BID  . carvedilol  6.25 mg Oral BID WC  . cloNIDine  0.1 mg Oral BID  . ferrous sulfate  325 mg Oral BID  . fluconazole  100 mg Oral Daily  . furosemide  40 mg Intravenous Once  . isosorbide mononitrate  30 mg Oral Daily  . metoCLOPramide  5 mg Oral QID  . pantoprazole  40 mg Oral Daily  . piperacillin-tazobactam (ZOSYN)  IV  3.375 g Intravenous Q8H  . potassium chloride  40 mEq Oral Once  . sertraline  100 mg Oral QHS  . simvastatin  20 mg Oral q1800  . torsemide  20 mg Oral BID  . vancomycin  750 mg Intravenous Q24H   Infusions:  . heparin 1,000 Units/hr (07/12/12 2042)    Assessment: 72 yo F on heparin infusion for new SOB and  elevated troponin, trending down.  Heparin level remains therapeutic.  Awaiting cardiac plans re: stress test vs. cath.  Goal of Therapy:  Heparin level 0.3-0.7 units/ml Monitor platelets by anticoagulation protocol: Yes   Plan:  Continue heparin at 1000 units/hr and f/u in am  Verlene Mayer, PharmD, [place] Pager [phone] 07/12/2012 9:54 PM

## 2012-07-12 NOTE — Progress Notes (Signed)
Rehab Admissions Coordinator Note:  Patient was screened by Brock Ra for appropriateness for an Inpatient Acute Rehab Consult.  At this time, pt's contribution during therapy is 0%.  Not appropriate for CIR at this time.  Will monitor pt &, if she becomes appropriate, will ask for Rehab consult then.  Brock Ra 07/12/2012, 2:17 PM  I can be reached at [phone].

## 2012-07-12 NOTE — Progress Notes (Signed)
SLP Cancellation Note  Patient Details Name: [name] MRN: [id number] DOB: Mar 25, 1940   Cancelled treatment:       Reason Eval/Treat Not Completed: Other (comment) (Pt refused).  Pt adamantly refused to participate. Feel pt does need further check of safety with POs given poor improvement on CXR. Left lung pna still significant. Pt consistently observed to be laying on left side. Question if she is laying on left side while drinking?  Poor posture would increase aspiration risk. Pt needs reinforcement of precautions.   Harlon Ditty, MA CCC-SLP [phone]  [name] 07/12/2012, 10:22 AM

## 2012-07-13 DIAGNOSIS — I359 Nonrheumatic aortic valve disorder, unspecified: Secondary | ICD-10-CM

## 2012-07-13 DIAGNOSIS — I5033 Acute on chronic diastolic (congestive) heart failure: Secondary | ICD-10-CM

## 2012-07-13 LAB — CBC
HCT: 27.8 % — ABNORMAL LOW (ref 36.0–46.0)
Hemoglobin: 8.6 g/dL — ABNORMAL LOW (ref 12.0–15.0)
RBC: 3.39 MIL/uL — ABNORMAL LOW (ref 3.87–5.11)
RDW: 17.4 % — ABNORMAL HIGH (ref 11.5–15.5)
WBC: 7.1 10*3/uL (ref 4.0–10.5)

## 2012-07-13 LAB — HEPARIN LEVEL (UNFRACTIONATED): Heparin Unfractionated: 0.63 IU/mL (ref 0.30–0.70)

## 2012-07-13 LAB — BASIC METABOLIC PANEL
BUN: 26 mg/dL — ABNORMAL HIGH (ref 6–23)
CO2: 30 mEq/L (ref 19–32)
Chloride: 102 mEq/L (ref 96–112)
GFR calc Af Amer: 22 mL/min — ABNORMAL LOW (ref >90)
Potassium: 3.9 mEq/L (ref 3.5–5.1)

## 2012-07-13 MED ORDER — HEPARIN SODIUM (PORCINE) 5000 UNIT/ML IJ SOLN
5000.0000 [IU] | Freq: Three times a day (TID) | INTRAMUSCULAR | Status: DC
  Administered 2012-07-13 – 2012-07-17 (×11): 5000 [IU] via SUBCUTANEOUS
  Filled 2012-07-13 (×15): qty 1

## 2012-07-13 MED ORDER — HYDRALAZINE HCL 20 MG/ML IJ SOLN
10.0000 mg | Freq: Once | INTRAMUSCULAR | Status: AC
  Administered 2012-07-13: 10 mg via INTRAVENOUS
  Filled 2012-07-13: qty 1

## 2012-07-13 MED ORDER — CLOPIDOGREL BISULFATE 75 MG PO TABS
300.0000 mg | ORAL_TABLET | Freq: Once | ORAL | Status: AC
  Administered 2012-07-13: 300 mg via ORAL
  Filled 2012-07-13: qty 4

## 2012-07-13 MED ORDER — HYDRALAZINE HCL 50 MG PO TABS
100.0000 mg | ORAL_TABLET | Freq: Three times a day (TID) | ORAL | Status: DC
  Administered 2012-07-13 – 2012-07-17 (×13): 100 mg via ORAL
  Filled 2012-07-13 (×15): qty 2

## 2012-07-13 MED ORDER — HYDRALAZINE HCL 20 MG/ML IJ SOLN
10.0000 mg | INTRAMUSCULAR | Status: DC | PRN
  Filled 2012-07-13: qty 1

## 2012-07-13 MED ORDER — CLOPIDOGREL BISULFATE 75 MG PO TABS
75.0000 mg | ORAL_TABLET | Freq: Every day | ORAL | Status: DC
  Administered 2012-07-14 – 2012-07-17 (×4): 75 mg via ORAL
  Filled 2012-07-13 (×4): qty 1

## 2012-07-13 NOTE — Progress Notes (Signed)
[facility] PROGRESS NOTE  [name] ZOX:[id number] DOB: 06-05-1940 DOA: 07/08/2012 PCP: Gwynneth Aliment, MD  72 yo AAF hx of cardiopulmonary arrest 01/2012 , s/p chronic trach , recurrent admission for trach issues, mucus plugging and HCAP (last admit 05/2012) . Brought to ER for resp distress and altered mental status found to by hypercarbic w/ PCO2 at 73. CCM asked to admit on 5/17.  Was started on broad spectrum antibiotics and was suctioned with improvement in dyspnea.    Assessment/Plan  Acute on Chronic Hypercarbic Resp Failure due to HCAP and acute on chronic diastolic heart failure Chronic Trach Dependent  Left Lung Atelectasis  HCAP  Had episode of SOB this morning that was severe and which has slowly improved with breathing treatments and diuresis.  On 10L via the trach collar currently. - Continue on Vanc/Zosyn IV day 6/7 - Trach collar for sats >90%, consider changing to cuffed trach if aspiration  - Pulm Hygiene, added Budesonide/Brovana Nebs Bid, & as needed Albuterol  - add incentive spirometry - OOB to chair - PT  Elevated troponins in the setting of HCAP and mild CHF with acute on chronic respiratory failure.  Troponins were considerably higher than the patient's baseline and may represent NSTEMI. -  Continue heparin gtt day 3/5 per cardiology -  On beta blocker, aspirin, statin and now plavix.  Acute on chronic systolic and diastolic heart failure:  elevated BNP.   Last echo 01/2012 w/ EF 45-50%, severe LV hypertrophy, LV diffuse hypokinesis, grade 1 diastolic dysfxn. LA mod dilated.  - Continue home meds norvasc (lower dose) ,coreg, clonidine,imdur and demadex, asa -  -5L over the last three days -  Continue gentle diuresis with oral torsemide  HTN with severely elevated blood pressure this morning -  Continue norvasc, clonidine, carvedilol, imdur,  -  Not on ACEI due to recent AKI and stage IV CKD -  Hydralazine added per cardiology this  morning  CKD stage 4 baseline scr 2.6 -Scr stable  - continue torsemide  GERD, dysphagia, stable.  Continue PPI and dysphagia 3  Anemia of chronic disease and iron deficiency, stable.  Continue iron  Hyperglycemia, resolved, likely stress reaction from acute illness. - d/c SSI and CBG  Diet:  Dysphagia 3 with thin liquids, aspiration precautions Access:  PICC line IVF:  OFF Proph:  Heparin gtt  Code Status: full code  Family Communication: spoke with patient alone  Disposition Plan: pending improvement in dyspnea   SIGNIFICANT EVENTS / STUDIES:  5/17> quiet night, req suctioning x2 only, sput sent for C&S  5/18> calm, talking w/ Passey-Muir  LINES / TUBES:  01/2012 Chronic trach >  CULTURES:  5/17 Sputum Cx >>  5/17 BC x 2 >>    Consultants:  Cardiology  PCCM  SIGNIFICANT EVENTS / STUDIES:  5/17> quiet night, req suctioning x2 only, sput sent for C&S  5/18> calm, talking w/ Passey-Muir   ANTIBIOTICS:  5/17 Vanc >  5/17 Zosyn >   HPI/Subjective:  Had episode of acute SOB this morning which may have been related to high blood pressures at the time.  Continues to feel SOB with minimal exertion.  Breathing is better post breathing treatments.  EAting well.  Sat at edge of bed today for a bath.    Objective: Filed Vitals:   07/13/12 0333 07/13/12 0428 07/13/12 0500 07/13/12 0900  BP:  217/86    Pulse: 60 60    Temp:  98.1 F (36.7 C)    TempSrc:  Resp: 22 22    Height:      Weight:   82.3 kg (181 lb 7 oz)   SpO2: 94% 90%  96%    Intake/Output Summary (Last 24 hours) at 07/13/12 1328 Last data filed at 07/13/12 1131  Gross per 24 hour  Intake    250 ml  Output   4200 ml  Net  -3950 ml   Filed Weights   07/11/12 0358 07/12/12 0641 07/13/12 0500  Weight: 83.6 kg (184 lb 4.9 oz) 83.4 kg (183 lb 13.8 oz) 82.3 kg (181 lb 7 oz)    Exam:   General:  AAF, mild respiratory distress  HEENT:  NCAT, MMM, trach in place with 10 L   Cardiovascular:   RRR, nl S1, S2 no mrg, 2+ pulses, warm extremities  Respiratory:  CTA on the right with diminished BS at the left base.  NO wheezes, rales, or rhonchi.  Abdomen:   NABS, soft, NT/ND  MSK:   Normal tone and bulk, 1+ LEE  Neuro:  Grossly intact  Data Reviewed: Basic Metabolic Panel:  Recent Labs Lab 07/09/12 0615 07/11/12 0420 07/11/12 2024 07/12/12 0230 07/13/12 0520  NA 142 140 138 138 141  K 4.0 3.9 4.5 4.2 3.9  CL 106 103 103 102 102  CO2 28 25 25 29 30   GLUCOSE 109* 97 143* 84 92  BUN 25* 30* 30* 30* 26*  CREATININE 2.27* 2.50* 2.51* 2.41* 2.45*  CALCIUM 9.2 8.5 8.5 8.3* 8.6  MG  --  1.9 2.0 1.8  --   PHOS  --  3.6  --  4.2  --    Liver Function Tests:  Recent Labs Lab 07/08/12 1050 07/09/12 0615  AST 14 30  ALT <5 7  ALKPHOS 86 66  BILITOT 0.3 0.2*  PROT 7.0 5.7*  ALBUMIN 3.2* 2.7*   No results found for this basename: LIPASE, AMYLASE,  in the last 168 hours  Recent Labs Lab 07/08/12 1050  AMMONIA 41   CBC:  Recent Labs Lab 07/08/12 1050  07/09/12 0615 07/11/12 0420 07/11/12 2024 07/12/12 0230 07/13/12 0520  WBC 14.6*  < > 5.9 6.6 6.7 6.6 7.1  NEUTROABS 6.1  --   --   --   --   --   --   HGB 10.5*  < > 8.5* 8.0* 8.2* 8.5* 8.6*  HCT 35.1*  < > 27.5* 26.2* 26.8* 27.4* 27.8*  MCV 83.2  < > 81.6 81.9 82.5 82.3 82.0  PLT 234  < > 166 186 193 188 207  < > = values in this interval not displayed. Cardiac Enzymes:  Recent Labs Lab 07/11/12 2024 07/12/12 0230 07/12/12 0800 07/12/12 1815  TROPONINI 1.13* 1.39* 1.42* 0.87*   BNP (last 3 results)  Recent Labs  06/04/12 0932 07/08/12 1050 07/09/12 0615  PROBNP 8639.0* 8263.0* 9076.0*   CBG:  Recent Labs Lab 07/12/12 1135 07/12/12 1721 07/12/12 2029 07/13/12 0726 07/13/12 1122  GLUCAP 103* 86 136* 95 102*    Recent Results (from the past 240 hour(s))  CULTURE, BLOOD (ROUTINE X 2)     Status: None   Collection Time    07/08/12 11:40 AM      Result Value Range Status    Specimen Description BLOOD LEFT ARM   Final   Special Requests BOTTLES DRAWN AEROBIC AND ANAEROBIC 10CC   Final   Culture  Setup Time 07/08/2012 19:00   Final   Culture     Final   Value:  BLOOD CULTURE RECEIVED NO GROWTH TO DATE CULTURE WILL BE HELD FOR 5 DAYS BEFORE ISSUING A FINAL NEGATIVE REPORT   Report Status PENDING   Incomplete  CULTURE, BLOOD (ROUTINE X 2)     Status: None   Collection Time    07/08/12 12:00 PM      Result Value Range Status   Specimen Description BLOOD LEFT HAND   Final   Special Requests BOTTLES DRAWN AEROBIC ONLY 10CC   Final   Culture  Setup Time 07/08/2012 19:00   Final   Culture     Final   Value:        BLOOD CULTURE RECEIVED NO GROWTH TO DATE CULTURE WILL BE HELD FOR 5 DAYS BEFORE ISSUING A FINAL NEGATIVE REPORT   Report Status PENDING   Incomplete  MRSA PCR SCREENING     Status: None   Collection Time    07/08/12  4:26 PM      Result Value Range Status   MRSA by PCR NEGATIVE  NEGATIVE Final   Comment:            The GeneXpert MRSA Assay (FDA     approved for NASAL specimens     only), is one component of a     comprehensive MRSA colonization     surveillance program. It is not     intended to diagnose MRSA     infection nor to guide or     monitor treatment for     MRSA infections.  CULTURE, RESPIRATORY (NON-EXPECTORATED)     Status: None   Collection Time    07/09/12  8:58 AM      Result Value Range Status   Specimen Description TRACHEAL ASPIRATE   Final   Special Requests NONE   Final   Gram Stain     Final   Value: MODERATE WBC PRESENT, PREDOMINANTLY PMN     NO SQUAMOUS EPITHELIAL CELLS SEEN     NO ORGANISMS SEEN   Culture Non-Pathogenic Oropharyngeal-type Flora Isolated.   Final   Report Status 07/11/2012 FINAL   Final     Studies: Dg Chest Port 1 View  07/12/2012   *RADIOLOGY REPORT*  Clinical Data: Follow up left lower lobe process.  PORTABLE CHEST - 1 VIEW  Comparison: 07/11/2012.  Findings: The tracheostomy tube is  stable.  The heart is enlarged but unchanged.  There is persistent left lower lobe opacity, likely a combination of atelectasis, infiltrate and effusion.  Persistent right basilar atelectasis also.  IMPRESSION: Stable chest x-ray findings.   Original Report Authenticated By: Rudie Meyer, M.D.    Scheduled Meds: . albuterol  2.5 mg Nebulization TID  . amLODipine  5 mg Oral Daily  . arformoterol  15 mcg Nebulization BID  . aspirin  81 mg Oral Daily  . budesonide (PULMICORT) nebulizer solution  0.25 mg Nebulization BID  . carvedilol  6.25 mg Oral BID WC  . cloNIDine  0.1 mg Oral BID  . [START ON 07/14/2012] clopidogrel  75 mg Oral Q breakfast  . ferrous sulfate  325 mg Oral BID  . fluconazole  100 mg Oral Daily  . furosemide  40 mg Intravenous Once  . heparin subcutaneous  5,000 Units Subcutaneous Q8H  . hydrALAZINE  100 mg Oral Q8H  . isosorbide mononitrate  30 mg Oral Daily  . metoCLOPramide  5 mg Oral QID  . pantoprazole  40 mg Oral Daily  . piperacillin-tazobactam (ZOSYN)  IV  3.375 g Intravenous Q8H  .  potassium chloride  40 mEq Oral Once  . sertraline  100 mg Oral QHS  . simvastatin  20 mg Oral q1800  . torsemide  20 mg Oral BID  . vancomycin  750 mg Intravenous Q24H   Continuous Infusions:    Principal Problem:   HCAP (healthcare-associated pneumonia) Active Problems:   Type 2 diabetes mellitus with vascular disease   Obesity (BMI 30-39.9)   Chronic cor pulmonale   Chronic kidney disease (CKD), stage IV (severe)   Chronic respiratory failure   Acute-on-chronic respiratory failure   Tracheostomy status    Time spent: 30 min    Aaro Meyers, [facility]  [facility] Pager [phone]. If 7PM-7AM, please contact night-coverage at [email or web address], password [facility] 07/13/2012, 1:28 PM  LOS: 5 days

## 2012-07-13 NOTE — Progress Notes (Signed)
Patient ID: [name], female   DOB: Jul 02, 1940, 72 y.o.   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 RHC 09/13/11: mild pulmonary hypertension, otherwise normal right heart pressures 2D echo 01/28/12: EF 45-50%, grade 1 diastolic dysfunction, severe LVH, diffuse HK, mild AS (mean grad 10 mmHg, peak 20), trivial MR, moderate biatrial enlargement, normal RV size and function  She has been admitted 7 times in the past year for similar issues. She has been seen by the heart failure team last fall. At that time, pBNP was markedly elevated and she was massively volume overloaded. She underwent RHC showing only mild pulmonary HTN believed to be attributed to her chronic pulmonary disease. She was diuresed with symptom improvement, since then she has been treated for her underlying, progressively worsening pulmonary status. She had an asystolic cardiac arrest in Jan 2014 in the setting of acute on chronic resp failure (mucous plug from trach). Ischemic work-up has been deferred due to worsening CKD.   Cardiology was Tuesday night for NSVT and positive troponin, 1.13 => 1.42. ECG did not show any acute ST-T wave abnormalities and is unchanged from previous.  She was started on heparin gtt.  Today, [name] is asymptomatic and denies CP, palpitations or dizziness. She has chronic SOB but feels this is improving since admission with treatment for PNA and HF.  She is currently on po torsemide with good urine output  yesterday.    Objective  Physical Exam: Vitals: BP 217/86  Pulse 60  Temp(Src) 98.1 F (36.7 C) (Oral)  Resp 22  Ht 5\' 4"  (1.626 m)  Wt 181 lb 7 oz (82.3 kg)  BMI 31.13 kg/m2  SpO2 96% General: Well developed, chronically ill appearing 72 year old female in no acute distress. Neck: Supple. Trach collar in place, JVP difficult. Lungs: Diminished breath sounds but clear bilaterally to auscultation without wheezes, rales, or rhonchi when auscultated anteriorly. Breathing is unlabored. Heart: RRR S1 S2 without murmurs, rubs, or gallops.  Abdomen: Soft, non-distended. Extremities: No clubbing or cyanosis. No edema.   Neuro: Alert and oriented X 3. Moves all extremities spontaneously. No focal deficits.  Intake/Output:  Intake/Output Summary (Last 24 hours) at 07/13/12 0950 Last data filed at 07/13/12 0855  Gross per 24 hour  Intake    370 ml  Output   3800 ml  Net  -3430 ml    Inpatient Medications:  . albuterol  2.5 mg Nebulization TID  . amLODipine  5 mg Oral Daily  . arformoterol  15 mcg Nebulization BID  . aspirin  81 mg Oral Daily  . budesonide (PULMICORT) nebulizer solution  0.25 mg Nebulization BID  . carvedilol  6.25 mg Oral BID WC  . cloNIDine  0.1 mg Oral BID  . clopidogrel  300 mg Oral Once  . [START ON 07/14/2012] clopidogrel  75 mg Oral Q breakfast  . ferrous sulfate  325 mg Oral BID  . fluconazole  100 mg Oral Daily  . furosemide  40 mg Intravenous Once  . heparin  subcutaneous  5,000 Units Subcutaneous Q8H  . isosorbide mononitrate  30 mg Oral Daily  . metoCLOPramide  5 mg Oral QID  . pantoprazole  40 mg Oral Daily  . piperacillin-tazobactam (ZOSYN)  IV  3.375 g Intravenous Q8H  . potassium chloride  40 mEq Oral Once  . sertraline  100 mg Oral QHS  . simvastatin  20 mg Oral q1800  . torsemide  20 mg Oral BID  . vancomycin  750 mg Intravenous Q24H      Labs:  Recent Labs  07/11/12 0420 07/11/12 2024 07/12/12 0230 07/13/12 0520  NA 140 138 138  141  K 3.9 4.5 4.2 3.9  CL 103 103 102 102  CO2 25 25 29 30   GLUCOSE 97 143* 84 92  BUN 30* 30* 30* 26*  CREATININE 2.50* 2.51* 2.41* 2.45*  CALCIUM 8.5 8.5 8.3* 8.6  MG 1.9 2.0 1.8  --   PHOS 3.6  --  4.2  --     Recent Labs  07/12/12 0230 07/13/12 0520  WBC 6.6 7.1  HGB 8.5* 8.6*  HCT 27.4* 27.8*  MCV 82.3 82.0  PLT 188 207    Recent Labs  07/11/12 2024 07/12/12 0230 07/12/12 0800 07/12/12 1815  TROPONINI 1.13* 1.39* 1.42* 0.87*    ProBNP 9076 (up from 1851 on 05/15/2012)   Radiology/Studies: Dg Chest Port 1 View  07/12/2012   *RADIOLOGY REPORT*  Clinical Data: Follow up left lower lobe process.  PORTABLE CHEST - 1 VIEW  Comparison: 07/11/2012.  Findings: The tracheostomy tube is stable.  The heart is enlarged but unchanged.  There is persistent left lower lobe opacity, likely a combination of atelectasis, infiltrate and effusion.  Persistent right basilar atelectasis also.  IMPRESSION: Stable chest x-ray findings.   Original Report Authenticated By: Rudie Meyer, M.D.   Dg Chest Port 1 View  07/11/2012   *RADIOLOGY REPORT*  Clinical Data: Left lower lobe atelectasis.  PORTABLE CHEST - 1 VIEW  Comparison: 05/18 and 07/08/2012 and 06/05/2047  Findings: Increasing atelectasis/infiltrate at the left lung base. Pulmonary vascular prominence of the right lung base.  Tracheostomy tube is unchanged.  IMPRESSION:    Increasing infiltrate/atelectasis at the left lung base.   Original Report Authenticated By: Francene Boyers, M.D.    Echocardiogram: Dec 2013 Study Conclusions - Left ventricle: The cavity size was normal. Wall thickness was increased in a pattern of severe LVH. Systolic function was mildly reduced. The estimated ejection fraction was in the range of 45% to 50%. Diffuse hypokinesis. Doppler parameters are consistent with abnormal left ventricular relaxation (grade 1 diastolic dysfunction). - Aortic valve: Trileaflet; moderately calcified leaflets. There was  very mild stenosis. Mean gradient: 10mm Hg (S). Peak gradient: 20mm Hg (S). - Mitral valve: Mildly calcified annulus. Trivial regurgitation. - Left atrium: The atrium was moderately dilated. - Right ventricle: The cavity size was normal. Systolic function was normal. - Right atrium: The atrium was moderately dilated. - Pulmonary arteries: No complete TR doppler jet so unable to estimate PA systolic pressure. - Systemic veins: IVC measured 1.8 cm with normal respirophasic variation suggesting RA pressure 6-10 mmHg. Impression: - Normal LV size with severe LV hypertrophy. EF 45-50% with global hypokinesis. Normal RV size and systoilc function. No significant valvular abnormalities.   Telemetry: SR; brief NSVT overnight    Assessment and Plan  1. Elevated troponin, NSVT 2. HCAP 3. Acute on chronic respiratory failure 4. Mild acute on chronic primarily diastolic HF with severe LVH 5. HTN 6. Acute  on chronic renal failure 7. Anemia 8. COPD 9. Suspect OHS/OSA  Most likely the troponin elevation represents demand ischemia from respiratory failure/PNA/anemia/HF/severe LVH. TnI peaked at 1.45.  Given renal failure and multiple comorbidities, not a good candidate for cardiac cath/invasive measures and recommend medical treatment for presumed CAD unless she develops symptoms of severe CP with dynamic ECG changes. Continue ASA, BB, Imdur and statin. I do think it is reasonable to add Plavix to her regimen.  She is anemic but this appears chronic.  Can stop heparin gtt today, restart subcutaneous heparin.   In terms of volume, her exam is difficult.  She appears to be diuresing well on current dose of po torsemide with stable creatinine, so will continue. I get the impression that she is not markedly volume overloaded and that her dyspnea is currently more related to her underlying lung disease (COPD, collapsed LLL, probable OHS, etc).   Signed, [name]

## 2012-07-13 NOTE — Progress Notes (Signed)
Echocardiogram 2D Echocardiogram has been performed.  [name] 07/13/2012, 10:32 AM

## 2012-07-13 NOTE — Progress Notes (Signed)
UR Completed Jamorris Ndiaye Graves-Bigelow, RN,BSN [phone]  

## 2012-07-14 LAB — CBC
HCT: 26.6 % — ABNORMAL LOW (ref 36.0–46.0)
Hemoglobin: 8.1 g/dL — ABNORMAL LOW (ref 12.0–15.0)
WBC: 8.1 10*3/uL (ref 4.0–10.5)

## 2012-07-14 LAB — GLUCOSE, CAPILLARY: Glucose-Capillary: 79 mg/dL (ref 70–99)

## 2012-07-14 LAB — CULTURE, BLOOD (ROUTINE X 2)

## 2012-07-14 LAB — BASIC METABOLIC PANEL
BUN: 23 mg/dL (ref 6–23)
Chloride: 103 mEq/L (ref 96–112)
GFR calc Af Amer: 21 mL/min — ABNORMAL LOW (ref >90)
Glucose, Bld: 92 mg/dL (ref 70–99)
Potassium: 3.9 mEq/L (ref 3.5–5.1)

## 2012-07-14 LAB — HEPARIN LEVEL (UNFRACTIONATED): Heparin Unfractionated: <0.1 IU/mL — ABNORMAL LOW (ref 0.30–0.70)

## 2012-07-14 MED ORDER — CLONIDINE HCL 0.2 MG PO TABS
0.2000 mg | ORAL_TABLET | Freq: Two times a day (BID) | ORAL | Status: DC
  Administered 2012-07-14 – 2012-07-17 (×7): 0.2 mg via ORAL
  Filled 2012-07-14 (×8): qty 1

## 2012-07-14 NOTE — Progress Notes (Signed)
Patient ID: [name], female   DOB: 30-Mar-1940, 72 y.o.   MRN: [id number] Subjective:  C/o mild side pain. Dyspnea may be a bit improved. No angina  Objective:  Vital Signs in the last 24 hours: Temp:  [98.3 F (36.8 C)-99.2 F (37.3 C)] 98.3 F (36.8 C) (05/23 0426) Pulse Rate:  [54-70] 62 (05/23 0426) Resp:  [16-20] 16 (05/23 0426) BP: (169-187)/(71-72) 186/71 mmHg (05/23 0426) SpO2:  [91 %-100 %] 98 % (05/23 0816) FiO2 (%):  [35 %-60 %] 40 % (05/23 0816) Weight:  [179 lb 0.2 oz (81.2 kg)] 179 lb 0.2 oz (81.2 kg) (05/23 0426)  Intake/Output from previous day: 05/22 0701 - 05/23 0700 In: 250 [P.O.:240; I.V.:10] Out: 2450 [Urine:2450] Intake/Output from this shift:    Physical Exam: Chronically ill appearing NAD HEENT: Unremarkable Neck:  Unable to assess JVD, no thyromegally Lungs:  Clear with rales in the bases HEART:  Regular rate rhythm, no murmurs, no rubs, no clicks Abd:  soft, obese, positive bowel sounds, no organomegally, no rebound, no guarding Ext:  2 plus pulses, no edema, no cyanosis, no clubbing Skin:  No rashes no nodules Neuro:  CN II through XII intact, motor grossly intact  Lab Results:  Recent Labs  07/13/12 0520 07/14/12 0500  WBC 7.1 8.1  HGB 8.6* 8.1*  PLT 207 218    Recent Labs  07/13/12 0520 07/14/12 0500  NA 141 140  K 3.9 3.9  CL 102 103  CO2 30 32  GLUCOSE 92 92  BUN 26* 23  CREATININE 2.45* 2.49*    Recent Labs  07/12/12 0800 07/12/12 1815  TROPONINI 1.42* 0.87*   Hepatic Function Panel No results found for this basename: PROT, ALBUMIN, AST, ALT, ALKPHOS, BILITOT, BILIDIR, IBILI,  in the last 72 hours No results found for this basename: CHOL,  in the last 72 hours No results found for this basename: PROTIME,  in the last 72 hours  Imaging: No results found.  Cardiac Studies: Tele - nsr  Assessment/Plan:  1. NSTEMI - likely type 2. Note Dr. Alford [name] note. No indication for invasive evaluation. See his  rec's for medical therapy 2. Acute on chronic respiratory failure - note that this is multifactorial. Continue Torsemide. Renal function is stable. Please call for additional questions.  LOS: 6 days    [name],M.D. 07/14/2012, 8:20 AM

## 2012-07-14 NOTE — Progress Notes (Addendum)
[facility] PROGRESS NOTE  [name] JXB:[id number] DOB: 02-10-1941 DOA: 07/08/2012 PCP: Gwynneth Aliment, MD  72 yo AAF hx of cardiopulmonary arrest 01/2012 , s/p chronic trach , recurrent admission for trach issues, mucus plugging and HCAP (last admit 05/2012) . Brought to ER for resp distress and altered mental status found to by hypercarbic w/ PCO2 at 74. CCM asked to admit on 5/17.  Was started on broad spectrum antibiotics and was suctioned with improvement in dyspnea.    Assessment/Plan  Acute on Chronic Hypercarbic Resp Failure due to HCAP and acute on chronic diastolic heart failure Chronic Trach Dependent  Left Lung Atelectasis  HCAP. Oxygen saturations trending up, but still on 40% oxygen via trach collar - Continue on Vanc/Zosyn IV day 7/7 -  Wean oxygen as tolerated - Continue budesonide/Brovana Nebs Bid & as needed Albuterol  - continue incentive spirometry - OOB to chair - PT evaluation  Elevated troponins in the setting of HCAP and mild CHF with acute on chronic respiratory failure.  Troponins were considerably higher than the patient's baseline and may represent NSTEMI. -  On beta blocker, aspirin, statin and now plavix. -  Tele:  SR 60s, 4 beats V-tach, sinus arrhythmia  Acute on chronic systolic and diastolic heart failure:  elevated BNP.   Last echo 01/2012 w/ EF 45-50%, severe LV hypertrophy, LV diffuse hypokinesis, grade 1 diastolic dysfxn. LA mod dilated.  -  Continue home meds norvasc (lower dose) ,coreg, clonidine,imdur and demadex, asa -  -2L yesterday -  Continue gentle diuresis with oral torsemide  HTN, blood pressure still elevated, but trending down -  Continue norvasc, carvedilol, imdur, hydralazine -  Not on ACEI due to recent AKI and stage IV CKD -  Increased clonidine  CKD stage 4 baseline scr 2.6 -Scr stable  - continue torsemide  GERD, dysphagia, stable.  Continue PPI and dysphagia 3  Anemia of chronic disease and iron  deficiency, stable.  Continue iron  Diet:  Dysphagia 3 with thin liquids, aspiration precautions Access:  PICC line IVF:  OFF Proph:  Heparin subcutaneous  Code Status: full code  Family Communication: spoke with patient alone  Disposition Plan:   Need to D/C foley catheter prior to discharge, however, patient still states she feels too SOB to use a the bedpan.  Agreed will d/c tomorrow.  Patient seems very deconditioned and is not eating.  She lives alone.  I think she will need SNF at discharge.  She was refused by CIR due to not participating in therapy.     SIGNIFICANT EVENTS / STUDIES:  5/17> quiet night, req suctioning x2 only, sput sent for C&S  5/18> calm, talking w/ Passey-Muir  LINES / TUBES:  01/2012 Chronic trach >  CULTURES:  5/17 Sputum Cx >> normal OP flora 5/17 BC x 2 >> NGF   Consultants:  Cardiology  PCCM  SIGNIFICANT EVENTS / STUDIES:  5/17> quiet night, req suctioning x2 only, sput sent for C&S  5/18> calm, talking w/ Passey-Muir   ANTIBIOTICS:  5/17 Vanc > 5/23 5/17 Zosyn > 5/23   HPI/Subjective:  Continues to feel SOB with minimal exertion.    Objective: Filed Vitals:   07/14/12 0330 07/14/12 0426 07/14/12 0816 07/14/12 0823  BP:  186/71  173/69  Pulse: 70 62 72 67  Temp:  98.3 F (36.8 C)    TempSrc:      Resp: 16 16 20    Height:      Weight:  81.2 kg (179  lb 0.2 oz)    SpO2: 97% 96% 98%     Intake/Output Summary (Last 24 hours) at 07/14/12 0932 Last data filed at 07/14/12 1610  Gross per 24 hour  Intake      0 ml  Output   2450 ml  Net  -2450 ml   Filed Weights   07/12/12 0641 07/13/12 0500 07/14/12 0426  Weight: 83.4 kg (183 lb 13.8 oz) 82.3 kg (181 lb 7 oz) 81.2 kg (179 lb 0.2 oz)    Exam:   General:  AAF, mild respiratory distress  HEENT:  NCAT, MMM, trach in place with 10 L/40% FIO2  Cardiovascular:  RRR, nl S1, S2 no mrg, 2+ pulses, warm extremities  Respiratory:  CTA on the right with diminished BS at the left  base.  NO wheezes, rales, or rhonchi.  Stable from prior  Abdomen:   NABS, soft, NT/ND  MSK:   Normal tone and bulk, trace LEE  Neuro:  Grossly intact  Data Reviewed: Basic Metabolic Panel:  Recent Labs Lab 07/11/12 0420 07/11/12 2024 07/12/12 0230 07/13/12 0520 07/14/12 0500  NA 140 138 138 141 140  K 3.9 4.5 4.2 3.9 3.9  CL 103 103 102 102 103  CO2 25 25 29 30  32  GLUCOSE 97 143* 84 92 92  BUN 30* 30* 30* 26* 23  CREATININE 2.50* 2.51* 2.41* 2.45* 2.49*  CALCIUM 8.5 8.5 8.3* 8.6 9.1  MG 1.9 2.0 1.8  --   --   PHOS 3.6  --  4.2  --   --    Liver Function Tests:  Recent Labs Lab 07/08/12 1050 07/09/12 0615  AST 14 30  ALT <5 7  ALKPHOS 86 66  BILITOT 0.3 0.2*  PROT 7.0 5.7*  ALBUMIN 3.2* 2.7*   No results found for this basename: LIPASE, AMYLASE,  in the last 168 hours  Recent Labs Lab 07/08/12 1050  AMMONIA 41   CBC:  Recent Labs Lab 07/08/12 1050  07/11/12 0420 07/11/12 2024 07/12/12 0230 07/13/12 0520 07/14/12 0500  WBC 14.6*  < > 6.6 6.7 6.6 7.1 8.1  NEUTROABS 6.1  --   --   --   --   --   --   HGB 10.5*  < > 8.0* 8.2* 8.5* 8.6* 8.1*  HCT 35.1*  < > 26.2* 26.8* 27.4* 27.8* 26.6*  MCV 83.2  < > 81.9 82.5 82.3 82.0 82.4  PLT 234  < > 186 193 188 207 218  < > = values in this interval not displayed. Cardiac Enzymes:  Recent Labs Lab 07/11/12 2024 07/12/12 0230 07/12/12 0800 07/12/12 1815  TROPONINI 1.13* 1.39* 1.42* 0.87*   BNP (last 3 results)  Recent Labs  06/04/12 0932 07/08/12 1050 07/09/12 0615  PROBNP 8639.0* 8263.0* 9076.0*   CBG:  Recent Labs Lab 07/13/12 0726 07/13/12 1122 07/13/12 1659 07/13/12 2146 07/14/12 0751  GLUCAP 95 102* 88 98 79    Recent Results (from the past 240 hour(s))  CULTURE, BLOOD (ROUTINE X 2)     Status: None   Collection Time    07/08/12 11:40 AM      Result Value Range Status   Specimen Description BLOOD LEFT ARM   Final   Special Requests BOTTLES DRAWN AEROBIC AND ANAEROBIC 10CC    Final   Culture  Setup Time 07/08/2012 19:00   Final   Culture     Final   Value:        BLOOD CULTURE RECEIVED NO  GROWTH TO DATE CULTURE WILL BE HELD FOR 5 DAYS BEFORE ISSUING A FINAL NEGATIVE REPORT   Report Status PENDING   Incomplete  CULTURE, BLOOD (ROUTINE X 2)     Status: None   Collection Time    07/08/12 12:00 PM      Result Value Range Status   Specimen Description BLOOD LEFT HAND   Final   Special Requests BOTTLES DRAWN AEROBIC ONLY 10CC   Final   Culture  Setup Time 07/08/2012 19:00   Final   Culture     Final   Value:        BLOOD CULTURE RECEIVED NO GROWTH TO DATE CULTURE WILL BE HELD FOR 5 DAYS BEFORE ISSUING A FINAL NEGATIVE REPORT   Report Status PENDING   Incomplete  MRSA PCR SCREENING     Status: None   Collection Time    07/08/12  4:26 PM      Result Value Range Status   MRSA by PCR NEGATIVE  NEGATIVE Final   Comment:            The GeneXpert MRSA Assay (FDA     approved for NASAL specimens     only), is one component of a     comprehensive MRSA colonization     surveillance program. It is not     intended to diagnose MRSA     infection nor to guide or     monitor treatment for     MRSA infections.  CULTURE, RESPIRATORY (NON-EXPECTORATED)     Status: None   Collection Time    07/09/12  8:58 AM      Result Value Range Status   Specimen Description TRACHEAL ASPIRATE   Final   Special Requests NONE   Final   Gram Stain     Final   Value: MODERATE WBC PRESENT, PREDOMINANTLY PMN     NO SQUAMOUS EPITHELIAL CELLS SEEN     NO ORGANISMS SEEN   Culture Non-Pathogenic Oropharyngeal-type Flora Isolated.   Final   Report Status 07/11/2012 FINAL   Final     Studies: No results found.  Scheduled Meds: . albuterol  2.5 mg Nebulization TID  . amLODipine  5 mg Oral Daily  . arformoterol  15 mcg Nebulization BID  . aspirin  81 mg Oral Daily  . budesonide (PULMICORT) nebulizer solution  0.25 mg Nebulization BID  . carvedilol  6.25 mg Oral BID WC  . cloNIDine  0.2  mg Oral BID  . clopidogrel  75 mg Oral Q breakfast  . ferrous sulfate  325 mg Oral BID  . fluconazole  100 mg Oral Daily  . furosemide  40 mg Intravenous Once  . heparin subcutaneous  5,000 Units Subcutaneous Q8H  . hydrALAZINE  100 mg Oral Q8H  . isosorbide mononitrate  30 mg Oral Daily  . metoCLOPramide  5 mg Oral QID  . pantoprazole  40 mg Oral Daily  . piperacillin-tazobactam (ZOSYN)  IV  3.375 g Intravenous Q8H  . potassium chloride  40 mEq Oral Once  . sertraline  100 mg Oral QHS  . simvastatin  20 mg Oral q1800  . torsemide  20 mg Oral BID  . vancomycin  750 mg Intravenous Q24H   Continuous Infusions:    Principal Problem:   HCAP (healthcare-associated pneumonia) Active Problems:   Type 2 diabetes mellitus with vascular disease   Obesity (BMI 30-39.9)   Chronic cor pulmonale   Chronic kidney disease (CKD), stage IV (severe)  Chronic respiratory failure   Acute-on-chronic respiratory failure   Tracheostomy status    Time spent: 30 min    Wahneta Derocher, [facility]  [facility] Pager [phone]. If 7PM-7AM, please contact night-coverage at [email or web address], password [facility] 07/14/2012, 9:32 AM  LOS: 6 days

## 2012-07-14 NOTE — Progress Notes (Signed)
PT Cancellation Note  Patient Details Name: [name] MRN: [id number] DOB: August 14, 1940   Cancelled Treatment:    Reason Eval/Treat Not Completed: Other (comment) (Pt refused even with max encouragement)   [name],Terri Malerba 07/14/2012, 1:01 PM [facility] [phone] (pager)

## 2012-07-15 ENCOUNTER — Inpatient Hospital Stay (HOSPITAL_COMMUNITY): Payer: Medicare Other

## 2012-07-15 DIAGNOSIS — I214 Non-ST elevation (NSTEMI) myocardial infarction: Secondary | ICD-10-CM

## 2012-07-15 LAB — GLUCOSE, CAPILLARY
Glucose-Capillary: 100 mg/dL — ABNORMAL HIGH (ref 70–99)
Glucose-Capillary: 165 mg/dL — ABNORMAL HIGH (ref 70–99)

## 2012-07-15 LAB — BASIC METABOLIC PANEL
BUN: 29 mg/dL — ABNORMAL HIGH (ref 6–23)
GFR calc Af Amer: 21 mL/min — ABNORMAL LOW (ref >90)
GFR calc non Af Amer: 18 mL/min — ABNORMAL LOW (ref >90)
Potassium: 3.9 mEq/L (ref 3.5–5.1)
Sodium: 141 mEq/L (ref 135–145)

## 2012-07-15 LAB — CBC
Hemoglobin: 8 g/dL — ABNORMAL LOW (ref 12.0–15.0)
MCHC: 30.9 g/dL (ref 30.0–36.0)

## 2012-07-15 MED ORDER — IPRATROPIUM BROMIDE 0.02 % IN SOLN
0.5000 mg | Freq: Three times a day (TID) | RESPIRATORY_TRACT | Status: DC
  Administered 2012-07-15 – 2012-07-17 (×7): 0.5 mg via RESPIRATORY_TRACT
  Filled 2012-07-15 (×7): qty 2.5

## 2012-07-15 MED ORDER — METHYLPREDNISOLONE SODIUM SUCC 125 MG IJ SOLR
125.0000 mg | Freq: Once | INTRAMUSCULAR | Status: AC
  Administered 2012-07-15: 125 mg via INTRAVENOUS
  Filled 2012-07-15: qty 2

## 2012-07-15 MED ORDER — PREDNISONE 20 MG PO TABS
40.0000 mg | ORAL_TABLET | Freq: Every day | ORAL | Status: DC
  Administered 2012-07-16 – 2012-07-17 (×2): 40 mg via ORAL
  Filled 2012-07-15 (×3): qty 2

## 2012-07-15 MED ORDER — TIOTROPIUM BROMIDE MONOHYDRATE 18 MCG IN CAPS
18.0000 ug | ORAL_CAPSULE | Freq: Every day | RESPIRATORY_TRACT | Status: DC
  Filled 2012-07-15: qty 5

## 2012-07-15 NOTE — Progress Notes (Signed)
[facility] PROGRESS NOTE  [name] ZOX:[id number] DOB: May 12, 1940 DOA: 07/08/2012 PCP: Gwynneth Aliment, MD  72 yo AAF hx of cardiopulmonary arrest 01/2012 , s/p chronic trach , recurrent admission for trach issues, mucus plugging and HCAP (last admit 05/2012) . Brought to ER for resp distress and altered mental status found to by hypercarbic w/ PCO2 at 73. CCM asked to admit on 5/17.  Was started on broad spectrum antibiotics and was suctioned with improvement in dyspnea.    Assessment/Plan  Acute on Chronic Hypercarbic Resp Failure due to HCAP and acute on chronic diastolic heart failure and acute  Chronic Trach Dependent  Left Lung Atelectasis  HCAP. Oxygen saturations labile today and feeling more Dodd Schmid of breath -  S/p 7 days of vanc/zosyn, last day on 5/23 -  Wean oxygen as tolerated - Continue budesonide/Brovana Nebs Bid -  Duonebs TID & as needed Albuterol  -  Trial of steroids, starting with solumedrol 125mg  today -  Repeat CXR:  Appears improved.  Left hemidiaphragm more visible and increased expansion.  Still fullness of the pulmonary arteries. -  continue incentive spirometry -  OOB to chair -  Patient refused PT evaluation  Elevated troponins in the setting of HCAP and mild CHF with acute on chronic respiratory failure.  Troponins were considerably higher than the patient's baseline and may represent NSTEMI, type 2 -  On beta blocker, aspirin, statin and now plavix. -  Appreciate cardiology assistance - they are signing off  Acute on chronic systolic and diastolic heart failure Last echo 01/2012 w/ EF 45-50%, severe LV hypertrophy, LV diffuse hypokinesis, grade 1 diastolic dysfxn. LA mod dilated.  -  Continue home meds norvasc (lower dose) ,coreg, clonidine,imdur and demadex, asa -  -1.1L yesterday -  Continue gentle diuresis with oral torsemide  HTN, blood pressure still elevated, but trending down -  Continue norvasc, carvedilol, imdur, hydralazine,  clonidine -  Not on ACEI due to recent AKI and stage IV CKD  CKD stage 4 baseline scr 2.6 -Scr stable  - continue torsemide  GERD, dysphagia, stable.  Continue PPI and dysphagia 3  Anemia of chronic disease and iron deficiency, stable.  Continue iron  Diet:  Dysphagia 3 with thin liquids, aspiration precautions Access:  PICC line IVF:  OFF Proph:  Heparin subcutaneous  Code Status: full code  Family Communication: spoke with patient alone  Disposition Plan:   Patient is unable to care for herself given degree of dyspnea.  She is alone several times a week while her caretaker goes to HD.  Needs to be less SOB prior to discharge home.  She refuses SNF and therapies.    SIGNIFICANT EVENTS / STUDIES:  5/17> quiet night, req suctioning x2 only, sput sent for C&S  5/18> calm, talking w/ Passey-Muir  LINES / TUBES:  01/2012 Chronic trach >  CULTURES:  5/17 Sputum Cx >> normal OP flora 5/17 BC x 2 >> NGF   Consultants:  Cardiology  PCCM  SIGNIFICANT EVENTS / STUDIES:  5/17> quiet night, req suctioning x2 only, sput sent for C&S  5/18> calm, talking w/ Passey-Muir   ANTIBIOTICS:  5/17 Vanc > 5/23 5/17 Zosyn > 5/23   HPI/Subjective:  Continues to feel SOB with minimal exertion.  Episode of desaturation to the low 80s.  Had small mucous plug removed by respiratory therapy.  Now requiring 35% FIO2 again.    Objective: Filed Vitals:   07/14/12 2258 07/15/12 0328 07/15/12 0456 07/15/12 0838  BP:  167/48   Pulse: 66 57 62 60  Temp:   98.4 F (36.9 C)   TempSrc:   Oral   Resp: 20 16 18 20   Height:      Weight:      SpO2: 94% 97% 97% 99%    Intake/Output Summary (Last 24 hours) at 07/15/12 1125 Last data filed at 07/15/12 0945  Gross per 24 hour  Intake    850 ml  Output   2052 ml  Net  -1202 ml   Filed Weights   07/12/12 0641 07/13/12 0500 07/14/12 0426  Weight: 83.4 kg (183 lb 13.8 oz) 82.3 kg (181 lb 7 oz) 81.2 kg (179 lb 0.2 oz)    Exam:   General:   AAF, mild respiratory distress  HEENT:  NCAT, MMM, trach in place 35% FIO2  Cardiovascular:  Bradycardic, RR, nl S1, S2 no mrg, 2+ pulses, warm extremities  Respiratory:   Diminished at bilateral bases, moreso on the left than the right.  Rales bilaterally at bases.  No wheezes or rhonchi.    Abdomen:   NABS, soft, NT/ND  MSK:   Normal tone and bulk, trace LEE  Neuro:  Grossly intact  Data Reviewed: Basic Metabolic Panel:  Recent Labs Lab 07/11/12 0420 07/11/12 2024 07/12/12 0230 07/13/12 0520 07/14/12 0500 07/15/12 0500  NA 140 138 138 141 140 141  K 3.9 4.5 4.2 3.9 3.9 3.9  CL 103 103 102 102 103 101  CO2 25 25 29 30  32 31  GLUCOSE 97 143* 84 92 92 105*  BUN 30* 30* 30* 26* 23 29*  CREATININE 2.50* 2.51* 2.41* 2.45* 2.49* 2.53*  CALCIUM 8.5 8.5 8.3* 8.6 9.1 9.0  MG 1.9 2.0 1.8  --   --   --   PHOS 3.6  --  4.2  --   --   --    Liver Function Tests:  Recent Labs Lab 07/09/12 0615  AST 30  ALT 7  ALKPHOS 66  BILITOT 0.2*  PROT 5.7*  ALBUMIN 2.7*   No results found for this basename: LIPASE, AMYLASE,  in the last 168 hours No results found for this basename: AMMONIA,  in the last 168 hours CBC:  Recent Labs Lab 07/11/12 2024 07/12/12 0230 07/13/12 0520 07/14/12 0500 07/15/12 0500  WBC 6.7 6.6 7.1 8.1 8.4  HGB 8.2* 8.5* 8.6* 8.1* 8.0*  HCT 26.8* 27.4* 27.8* 26.6* 25.9*  MCV 82.5 82.3 82.0 82.4 82.0  PLT 193 188 207 218 210   Cardiac Enzymes:  Recent Labs Lab 07/11/12 2024 07/12/12 0230 07/12/12 0800 07/12/12 1815  TROPONINI 1.13* 1.39* 1.42* 0.87*   BNP (last 3 results)  Recent Labs  06/04/12 0932 07/08/12 1050 07/09/12 0615  PROBNP 8639.0* 8263.0* 9076.0*   CBG:  Recent Labs Lab 07/13/12 1122 07/13/12 1659 07/13/12 2146 07/14/12 0751 07/15/12 0744  GLUCAP 102* 88 98 79 100*    Recent Results (from the past 240 hour(s))  CULTURE, BLOOD (ROUTINE X 2)     Status: None   Collection Time    07/08/12 11:40 AM      Result  Value Range Status   Specimen Description BLOOD LEFT ARM   Final   Special Requests BOTTLES DRAWN AEROBIC AND ANAEROBIC 10CC   Final   Culture  Setup Time 07/08/2012 19:00   Final   Culture NO GROWTH 5 DAYS   Final   Report Status 07/14/2012 FINAL   Final  CULTURE, BLOOD (ROUTINE X 2)  Status: None   Collection Time    07/08/12 12:00 PM      Result Value Range Status   Specimen Description BLOOD LEFT HAND   Final   Special Requests BOTTLES DRAWN AEROBIC ONLY 10CC   Final   Culture  Setup Time 07/08/2012 19:00   Final   Culture NO GROWTH 5 DAYS   Final   Report Status 07/14/2012 FINAL   Final  MRSA PCR SCREENING     Status: None   Collection Time    07/08/12  4:26 PM      Result Value Range Status   MRSA by PCR NEGATIVE  NEGATIVE Final   Comment:            The GeneXpert MRSA Assay (FDA     approved for NASAL specimens     only), is one component of a     comprehensive MRSA colonization     surveillance program. It is not     intended to diagnose MRSA     infection nor to guide or     monitor treatment for     MRSA infections.  CULTURE, RESPIRATORY (NON-EXPECTORATED)     Status: None   Collection Time    07/09/12  8:58 AM      Result Value Range Status   Specimen Description TRACHEAL ASPIRATE   Final   Special Requests NONE   Final   Gram Stain     Final   Value: MODERATE WBC PRESENT, PREDOMINANTLY PMN     NO SQUAMOUS EPITHELIAL CELLS SEEN     NO ORGANISMS SEEN   Culture Non-Pathogenic Oropharyngeal-type Flora Isolated.   Final   Report Status 07/11/2012 FINAL   Final     Studies: No results found.  Scheduled Meds: . albuterol  2.5 mg Nebulization TID  . amLODipine  5 mg Oral Daily  . arformoterol  15 mcg Nebulization BID  . aspirin  81 mg Oral Daily  . budesonide (PULMICORT) nebulizer solution  0.25 mg Nebulization BID  . carvedilol  6.25 mg Oral BID WC  . cloNIDine  0.2 mg Oral BID  . clopidogrel  75 mg Oral Q breakfast  . ferrous sulfate  325 mg Oral BID   . furosemide  40 mg Intravenous Once  . heparin subcutaneous  5,000 Units Subcutaneous Q8H  . hydrALAZINE  100 mg Oral Q8H  . isosorbide mononitrate  30 mg Oral Daily  . methylPREDNISolone (SOLU-MEDROL) injection  125 mg Intravenous Once  . metoCLOPramide  5 mg Oral QID  . pantoprazole  40 mg Oral Daily  . potassium chloride  40 mEq Oral Once  . [START ON 07/16/2012] predniSONE  40 mg Oral Q breakfast  . sertraline  100 mg Oral QHS  . simvastatin  20 mg Oral q1800  . tiotropium  18 mcg Inhalation Daily  . torsemide  20 mg Oral BID   Continuous Infusions:    Principal Problem:   HCAP (healthcare-associated pneumonia) Active Problems:   Type 2 diabetes mellitus with vascular disease   Obesity (BMI 30-39.9)   Chronic cor pulmonale   Chronic kidney disease (CKD), stage IV (severe)   Chronic respiratory failure   Acute-on-chronic respiratory failure   Tracheostomy status   SEMI (subendocardial myocardial infarction)    Time spent: 30 min    Mychal Durio, [facility]  [facility] Pager [phone]. If 7PM-7AM, please contact night-coverage at [email or web address], password [email or web address] 07/15/2012, 11:25 AM  LOS: 7 days

## 2012-07-15 NOTE — Progress Notes (Signed)
Called by RN to assess pt at this time. Pt appears SOB and states SOB. RT assessed Pt SPo2 85% upon arrival. RN had just suctioned with no return. Pt has small amount of dried secretion at end of Inner Cannula. Inner Cannula cleaned and dried. Pt tolerated well and stated she felt better. Spo2 up to 97% with ATC on 35% and clean inner Cannula. RT will continue to monitor.

## 2012-07-15 NOTE — Progress Notes (Signed)
Patient ID: [name], female   DOB: [birth year], 72 y.o.   MRN: [id number] Patient ID: [name], female   DOB: 06-Apr-1940, 72 y.o.   MRN: [id number]     Patient: [name] Date of Encounter: 07/15/2012, 8:24 AM Admit date: 07/08/2012     Subjective  Background summary: [name] is a 72 year old woman with a complex PMHx including chronic respiratory failure, OSA (refuses CPAP), combined diastolic and systolic HF with cor pulmonale, chronic LLL collapse, COPD (on 4L O2 with trach), prior tobacco abuse, morbid obesity, hypertension, stage IV CKD (baseline Cr 2.1-2.5), DM and anemia who was admitted on 07/08/2012 with shortness of breath and AMS. RHC 09/13/11: mild pulmonary hypertension, otherwise normal right heart pressures 2D echo 01/28/12: EF 45-50%, grade 1 diastolic dysfunction, severe LVH, diffuse HK, mild AS (mean grad 10 mmHg, peak 20), trivial MR, moderate biatrial enlargement, normal RV size and function  She has been admitted 7 times in the past year for similar issues. She has been seen by the heart failure team last fall. At that time, pBNP was markedly elevated and she was massively volume overloaded. She underwent RHC showing only mild pulmonary HTN believed to be attributed to her chronic pulmonary disease. She was diuresed with symptom improvement, since then she has been treated for her underlying, progressively worsening pulmonary status. She had an asystolic cardiac arrest in Jan 2014 in the setting of acute on chronic resp failure (mucous plug from trach). Ischemic work-up has been deferred due to worsening CKD.   Cardiology was Tuesday night for NSVT and positive troponin, 1.13 => 1.42. ECG did not show any acute ST-T wave abnormalities and is unchanged from previous.  She was started on heparin gtt.  Today, [name] is asymptomatic and denies CP, palpitations or dizziness. She has chronic SOB but feels this is improving since admission with  treatment for PNA and HF.  She is currently on po torsemide with good urine output yesterday.    Objective  Physical Exam: Vitals: BP 167/48  Pulse 62  Temp(Src) 98.4 F (36.9 C) (Oral)  Resp 18  Ht 5\' 4"  (1.626 m)  Wt 179 lb 0.2 oz (81.2 kg)  BMI 30.71 kg/m2  SpO2 97% General: Well developed, chronically ill appearing 72 year old female in no acute distress. Neck: Supple. Trach collar in place, JVP difficult. Lungs: Rhonchi and bilateral  wheezes,  Heart: RRR S1 S2 without murmurs, rubs, or gallops.  Abdomen: Soft, non-distended. Extremities: No clubbing or cyanosis. No edema.   Neuro: Alert and oriented X 3. Moves all extremities spontaneously. No focal deficits.  Intake/Output:  Intake/Output Summary (Last 24 hours) at 07/15/12 0824 Last data filed at 07/15/12 0810  Gross per 24 hour  Intake    850 ml  Output   2052 ml  Net  -1202 ml    Inpatient Medications:  . albuterol  2.5 mg Nebulization TID  . amLODipine  5 mg Oral Daily  . arformoterol  15 mcg Nebulization BID  . aspirin  81 mg Oral Daily  . budesonide (PULMICORT) nebulizer solution  0.25 mg Nebulization BID  . carvedilol  6.25 mg Oral BID WC  . cloNIDine  0.2 mg Oral BID  . clopidogrel  75 mg Oral Q breakfast  . ferrous sulfate  325 mg Oral BID  . fluconazole  100 mg Oral Daily  . furosemide  40 mg Intravenous Once  . heparin subcutaneous  5,000 Units Subcutaneous Q8H  .  hydrALAZINE  100 mg Oral Q8H  . isosorbide mononitrate  30 mg Oral Daily  . metoCLOPramide  5 mg Oral QID  . pantoprazole  40 mg Oral Daily  . potassium chloride  40 mEq Oral Once  . sertraline  100 mg Oral QHS  . simvastatin  20 mg Oral q1800  . torsemide  20 mg Oral BID      Labs:  Recent Labs  07/14/12 0500 07/15/12 0500  NA 140 141  K 3.9 3.9  CL 103 101  CO2 32 31  GLUCOSE 92 105*  BUN 23 29*  CREATININE 2.49* 2.53*  CALCIUM 9.1 9.0    Recent Labs  07/14/12 0500 07/15/12 0500  WBC 8.1 8.4  HGB 8.1* 8.0*  HCT  26.6* 25.9*  MCV 82.4 82.0  PLT 218 210    Recent Labs  07/12/12 1815  TROPONINI 0.87*    ProBNP 9076 (up from 1851 on 05/15/2012)   Radiology/Studies: Dg Chest Port 1 View  07/12/2012   *RADIOLOGY REPORT*  Clinical Data: Follow up left lower lobe process.  PORTABLE CHEST - 1 VIEW  Comparison: 07/11/2012.  Findings: The tracheostomy tube is stable.  The heart is enlarged but unchanged.  There is persistent left lower lobe opacity, likely a combination of atelectasis, infiltrate and effusion.  Persistent right basilar atelectasis also.  IMPRESSION: Stable chest x-ray findings.   Original Report Authenticated By: Rudie Meyer, M.D.   Dg Chest Port 1 View  07/11/2012   *RADIOLOGY REPORT*  Clinical Data: Left lower lobe atelectasis.  PORTABLE CHEST - 1 VIEW  Comparison: 05/18 and 07/08/2012 and 06/05/2047  Findings: Increasing atelectasis/infiltrate at the left lung base. Pulmonary vascular prominence of the right lung base.  Tracheostomy tube is unchanged.  IMPRESSION:    Increasing infiltrate/atelectasis at the left lung base.   Original Report Authenticated By: Francene Boyers, M.D.    Echocardiogram: Dec 2013 Study Conclusions - Left ventricle: The cavity size was normal. Wall thickness was increased in a pattern of severe LVH. Systolic function was mildly reduced. The estimated ejection fraction was in the range of 45% to 50%. Diffuse hypokinesis. Doppler parameters are consistent with abnormal left ventricular relaxation (grade 1 diastolic dysfunction). - Aortic valve: Trileaflet; moderately calcified leaflets. There was very mild stenosis. Mean gradient: 10mm Hg (S). Peak gradient: 20mm Hg (S). - Mitral valve: Mildly calcified annulus. Trivial regurgitation. - Left atrium: The atrium was moderately dilated. - Right ventricle: The cavity size was normal. Systolic function was normal. - Right atrium: The atrium was moderately dilated. - Pulmonary arteries: No complete TR  doppler jet so unable to estimate PA systolic pressure. - Systemic veins: IVC measured 1.8 cm with normal respirophasic variation suggesting RA pressure 6-10 mmHg. Impression: - Normal LV size with severe LV hypertrophy. EF 45-50% with global hypokinesis. Normal RV size and systoilc function. No significant valvular abnormalities.   Telemetry: SR; brief NSVT overnight    Assessment and Plan  1. Elevated troponin, NSVT 2. HCAP 3. Acute on chronic respiratory failure 4. Mild acute on chronic primarily diastolic HF with severe LVH 5. HTN 6. Acute on chronic renal failure 7. Anemia 8. COPD 9. Suspect OHS/OSA  See note by Dr Shirlee Latch.  Minimal elevation in troponin with no dynamic ECG changes and no chest pain.  Primary issue appears to be advancing lung disease No cath unless severe chest pain in setting of dynamic ECG changes Continue medical Rx Plavix added  Will sign off   Signed, Charlton Haws

## 2012-07-15 NOTE — Progress Notes (Signed)
Patient with desaturations this afternoon, oxygen sats down to 84-86% on 28% trach collar with visible shortness of breath.  PMV taken off of trach.  Patient suctions with no secretions returned.  Respiratory therapy called to assess patient.  Breathing treatment given and inner cannula removed with very small mucous plug cleaned out.  Oxygen increased to 35% trach collar and oxygen saturations 94-97% after inner cannula cleaned.  Dr. Malachi Bonds on floor and made aware.  Will continue to monitor.  Colman Cater

## 2012-07-16 DIAGNOSIS — I279 Pulmonary heart disease, unspecified: Secondary | ICD-10-CM

## 2012-07-16 LAB — GLUCOSE, CAPILLARY
Glucose-Capillary: 108 mg/dL — ABNORMAL HIGH (ref 70–99)
Glucose-Capillary: 83 mg/dL (ref 70–99)
Glucose-Capillary: 149 mg/dL — ABNORMAL HIGH (ref 70–99)

## 2012-07-16 LAB — CBC
HCT: 25.2 % — ABNORMAL LOW (ref 36.0–46.0)
Hemoglobin: 7.9 g/dL — ABNORMAL LOW (ref 12.0–15.0)
MCV: 79.7 fL (ref 78.0–100.0)
RBC: 3.16 MIL/uL — ABNORMAL LOW (ref 3.87–5.11)
WBC: 7.3 10*3/uL (ref 4.0–10.5)

## 2012-07-16 LAB — BASIC METABOLIC PANEL
CO2: 27 mEq/L (ref 19–32)
Chloride: 99 mEq/L (ref 96–112)
GFR calc Af Amer: 21 mL/min — ABNORMAL LOW (ref >90)
Potassium: 4.3 mEq/L (ref 3.5–5.1)
Sodium: 136 mEq/L (ref 135–145)

## 2012-07-16 MED ORDER — ARFORMOTEROL TARTRATE 15 MCG/2ML IN NEBU: 15.0000 ug | INHALATION_SOLUTION | Freq: Two times a day (BID) | RESPIRATORY_TRACT | Status: DC

## 2012-07-16 MED ORDER — BUDESONIDE 0.25 MG/2ML IN SUSP: 0.2500 mg | Freq: Two times a day (BID) | RESPIRATORY_TRACT | Status: DC

## 2012-07-16 MED ORDER — CLOPIDOGREL BISULFATE 75 MG PO TABS: 75.0000 mg | ORAL_TABLET | Freq: Every day | ORAL | Status: DC

## 2012-07-16 MED ORDER — PANTOPRAZOLE SODIUM 40 MG PO TBEC: 40.0000 mg | DELAYED_RELEASE_TABLET | Freq: Every evening | ORAL | Status: DC

## 2012-07-16 MED ORDER — CLONIDINE HCL 0.2 MG PO TABS: 0.1000 mg | ORAL_TABLET | Freq: Two times a day (BID) | ORAL | Status: DC

## 2012-07-16 MED ORDER — DM-GUAIFENESIN ER 30-600 MG PO TB12
1.0000 | ORAL_TABLET | Freq: Two times a day (BID) | ORAL | Status: DC
  Administered 2012-07-16 – 2012-07-17 (×3): 1 via ORAL
  Filled 2012-07-16 (×4): qty 1

## 2012-07-16 MED ORDER — DM-GUAIFENESIN ER 30-600 MG PO TB12: 1.0000 | ORAL_TABLET | Freq: Two times a day (BID) | ORAL | Status: DC

## 2012-07-16 MED ORDER — ATORVASTATIN CALCIUM 40 MG PO TABS: 40.0000 mg | ORAL_TABLET | Freq: Every day | ORAL | Status: DC

## 2012-07-16 MED ORDER — IPRATROPIUM BROMIDE 0.02 % IN SOLN: 0.5000 mg | Freq: Three times a day (TID) | RESPIRATORY_TRACT | Status: DC

## 2012-07-16 MED ORDER — FERROUS SULFATE 325 (65 FE) MG PO TBEC: 325.0000 mg | DELAYED_RELEASE_TABLET | Freq: Two times a day (BID) | ORAL | Status: DC

## 2012-07-16 MED ORDER — PREDNISONE 10 MG PO TABS: ORAL_TABLET | ORAL | Status: DC

## 2012-07-16 NOTE — Progress Notes (Signed)
[facility] PROGRESS NOTE  [name] ZOX:[id number] DOB: 14-Oct-1940 DOA: 07/08/2012 PCP: Gwynneth Aliment, MD  72 yo AAF hx of cardiopulmonary arrest 01/2012 , s/p chronic trach , recurrent admission for trach issues, mucus plugging and HCAP (last admit 05/2012) . Brought to ER for resp distress and altered mental status found to by hypercarbic w/ PCO2 at 73. CCM asked to admit on 5/17.  Was started on broad spectrum antibiotics and was suctioned with improvement in dyspnea.    Assessment/Plan  Acute on Chronic Hypercarbic Resp Failure due to HCAP and acute on chronic diastolic heart failure and acute  Chronic Trach Dependent  Left Lung Atelectasis  HCAP Mucous plugging with another plug this morning requiring suctioning and assistance by RT Oxygen saturations labile today and feeling more Leronda Lewers of breath -  S/p 7 days of vanc/zosyn, last day on 5/23 -  Wean oxygen as tolerated -  Continue budesonide/Brovana Nebs Bid -  Duonebs TID & as needed Albuterol  -  Steroid taper -  Add mucinex -  continue incentive spirometry -  OOB to chair -  Patient refused PT evaluation  Diarrhea -  At-risk for C. difficile diarrhea due to recent antibiotics for healthcare associated pneumonia - C. difficile PCR -  If discharged prior to C. difficile PCR being reported we'll start empiric Flagyl.  Elevated troponins in the setting of HCAP and mild CHF with acute on chronic respiratory failure.  Troponins were considerably higher than the patient's baseline and may represent NSTEMI, type 2 -  On beta blocker, aspirin, statin and now plavix.  Acute on chronic systolic and diastolic heart failure Last echo 01/2012 w/ EF 45-50%, severe LV hypertrophy, LV diffuse hypokinesis, grade 1 diastolic dysfxn. LA mod dilated.  -  Continue norvasc, coreg, clonidine, imdur, demadex, asa -  Continue gentle diuresis with oral torsemide  HTN, blood pressure still elevated, but trending down -  Continue  norvasc, carvedilol, imdur, hydralazine, clonidine -  Not on ACEI due to recent AKI and stage IV CKD  CKD stage 4 baseline scr 2.6 -Scr stable  - continue torsemide  GERD, dysphagia, stable.  Continue PPI and dysphagia 3  Anemia of chronic disease and iron deficiency, stable.  Continue iron  Diet:  Dysphagia 3 with thin liquids, aspiration precautions Access:  PICC line IVF:  OFF Proph:  Heparin subcutaneous  Code Status: full code  Family Communication: spoke with patient alone  Disposition Plan:   Patient is unable to care for herself given degree of dyspnea.  Attempting to contact family to make discharge plans.  The patient needs 24-hour supervision and we have been unable to reach her daughter.  Patient refuses skilled nursing.  We will continue to investigate her diarrhea and improve her respiratory status.  Patient will most likely be ready for discharge later this afternoon or tomorrow.      SIGNIFICANT EVENTS / STUDIES:  5/17> quiet night, req suctioning x2 only, sput sent for C&S  5/18> calm, talking w/ Passey-Muir  LINES / TUBES:  01/2012 Chronic trach >  CULTURES:  5/17 Sputum Cx >> normal OP flora 5/17 BC x 2 >> NGF   Consultants:  Cardiology  PCCM  SIGNIFICANT EVENTS / STUDIES:  5/17> quiet night, req suctioning x2 only, sput sent for C&S  5/18> calm, talking w/ Passey-Muir   ANTIBIOTICS:  5/17 Vanc > 5/23 5/17 Zosyn > 5/23   HPI/Subjective: Patient states that her breathing is somewhat better, however she is still having some  watery diarrhea. She has noticed some streaks of blood mixed in with her stools over the last couple of hours. There have been 5 recorded bowel movements over the last 24 hours.  Objective: Filed Vitals:   07/16/12 0628 07/16/12 0852 07/16/12 1142 07/16/12 1400  BP: 160/81   151/67  Pulse: 58 69 59 60  Temp: 98.5 F (36.9 C)   98.9 F (37.2 C)  TempSrc: Oral   Oral  Resp: 18 14 14 16   Height:      Weight: 82.3 kg (181 lb  7 oz)     SpO2: 95% 94% 94% 100%    Intake/Output Summary (Last 24 hours) at 07/16/12 1451 Last data filed at 07/16/12 1300  Gross per 24 hour  Intake   1680 ml  Output    450 ml  Net   1230 ml   Filed Weights   07/13/12 0500 07/14/12 0426 07/16/12 0628  Weight: 82.3 kg (181 lb 7 oz) 81.2 kg (179 lb 0.2 oz) 82.3 kg (181 lb 7 oz)    Exam:  General: AAF, no acute distress  HEENT: NCAT, MMM, trach in place 35% FIO2  Cardiovascular: RRR, nl S1, S2 no mrg, 2+ pulses, warm extremities  Respiratory: Diminished at bilateral bases, moreso on the left than the right. Rales bilaterally at bases. No wheezes or rhonchi.  Abdomen: NABS, soft, NT/ND  MSK: Normal tone and bulk, trace LEE  Neuro: Grossly intact  Data Reviewed: Basic Metabolic Panel:  Recent Labs Lab 07/11/12 0420 07/11/12 2024 07/12/12 0230 07/13/12 0520 07/14/12 0500 07/15/12 0500 07/16/12 0600  NA 140 138 138 141 140 141 136  K 3.9 4.5 4.2 3.9 3.9 3.9 4.3  CL 103 103 102 102 103 101 99  CO2 25 25 29 30  32 31 27  GLUCOSE 97 143* 84 92 92 105* 117*  BUN 30* 30* 30* 26* 23 29* 33*  CREATININE 2.50* 2.51* 2.41* 2.45* 2.49* 2.53* 2.47*  CALCIUM 8.5 8.5 8.3* 8.6 9.1 9.0 8.8  MG 1.9 2.0 1.8  --   --   --   --   PHOS 3.6  --  4.2  --   --   --   --    Liver Function Tests: No results found for this basename: AST, ALT, ALKPHOS, BILITOT, PROT, ALBUMIN,  in the last 168 hours No results found for this basename: LIPASE, AMYLASE,  in the last 168 hours No results found for this basename: AMMONIA,  in the last 168 hours CBC:  Recent Labs Lab 07/12/12 0230 07/13/12 0520 07/14/12 0500 07/15/12 0500 07/16/12 0600  WBC 6.6 7.1 8.1 8.4 7.3  HGB 8.5* 8.6* 8.1* 8.0* 7.9*  HCT 27.4* 27.8* 26.6* 25.9* 25.2*  MCV 82.3 82.0 82.4 82.0 79.7  PLT 188 207 218 210 212   Cardiac Enzymes:  Recent Labs Lab 07/11/12 2024 07/12/12 0230 07/12/12 0800 07/12/12 1815  TROPONINI 1.13* 1.39* 1.42* 0.87*   BNP (last 3  results)  Recent Labs  06/04/12 0932 07/08/12 1050 07/09/12 0615  PROBNP 8639.0* 8263.0* 9076.0*   CBG:  Recent Labs Lab 07/15/12 1115 07/15/12 1623 07/15/12 2102 07/16/12 0739 07/16/12 1205  GLUCAP 101* 165* 154* 108* 83    Recent Results (from the past 240 hour(s))  CULTURE, BLOOD (ROUTINE X 2)     Status: None   Collection Time    07/08/12 11:40 AM      Result Value Range Status   Specimen Description BLOOD LEFT ARM   Final  Special Requests BOTTLES DRAWN AEROBIC AND ANAEROBIC 10CC   Final   Culture  Setup Time 07/08/2012 19:00   Final   Culture NO GROWTH 5 DAYS   Final   Report Status 07/14/2012 FINAL   Final  CULTURE, BLOOD (ROUTINE X 2)     Status: None   Collection Time    07/08/12 12:00 PM      Result Value Range Status   Specimen Description BLOOD LEFT HAND   Final   Special Requests BOTTLES DRAWN AEROBIC ONLY 10CC   Final   Culture  Setup Time 07/08/2012 19:00   Final   Culture NO GROWTH 5 DAYS   Final   Report Status 07/14/2012 FINAL   Final  MRSA PCR SCREENING     Status: None   Collection Time    07/08/12  4:26 PM      Result Value Range Status   MRSA by PCR NEGATIVE  NEGATIVE Final   Comment:            The GeneXpert MRSA Assay (FDA     approved for NASAL specimens     only), is one component of a     comprehensive MRSA colonization     surveillance program. It is not     intended to diagnose MRSA     infection nor to guide or     monitor treatment for     MRSA infections.  CULTURE, RESPIRATORY (NON-EXPECTORATED)     Status: None   Collection Time    07/09/12  8:58 AM      Result Value Range Status   Specimen Description TRACHEAL ASPIRATE   Final   Special Requests NONE   Final   Gram Stain     Final   Value: MODERATE WBC PRESENT, PREDOMINANTLY PMN     NO SQUAMOUS EPITHELIAL CELLS SEEN     NO ORGANISMS SEEN   Culture Non-Pathogenic Oropharyngeal-type Flora Isolated.   Final   Report Status 07/11/2012 FINAL   Final     Studies: Dg  Chest Port 1 View  07/15/2012   *RADIOLOGY REPORT*  Clinical Data: Shortness of breath.  PORTABLE CHEST - 1 VIEW  Comparison: 07/12/2012  Findings: Heart is enlarged. There has been improvement in aeration of the left lung base.  There is pulmonary vascular congestion without overt edema.  Tracheostomy tube is in place.  No focal consolidations or pleural effusions.  There is minimal left midlung zone atelectasis or scarring.  IMPRESSION:  1.  Cardiomegaly. 2.  Left midlung zone scarring or atelectasis.   Original Report Authenticated By: Norva Pavlov, M.D.    Scheduled Meds: . albuterol  2.5 mg Nebulization TID  . amLODipine  5 mg Oral Daily  . arformoterol  15 mcg Nebulization BID  . aspirin  81 mg Oral Daily  . budesonide (PULMICORT) nebulizer solution  0.25 mg Nebulization BID  . carvedilol  6.25 mg Oral BID WC  . cloNIDine  0.2 mg Oral BID  . clopidogrel  75 mg Oral Q breakfast  . dextromethorphan-guaiFENesin  1 tablet Oral BID  . ferrous sulfate  325 mg Oral BID  . furosemide  40 mg Intravenous Once  . heparin subcutaneous  5,000 Units Subcutaneous Q8H  . hydrALAZINE  100 mg Oral Q8H  . ipratropium  0.5 mg Nebulization TID  . isosorbide mononitrate  30 mg Oral Daily  . metoCLOPramide  5 mg Oral QID  . pantoprazole  40 mg Oral Daily  . potassium chloride  40 mEq Oral Once  . predniSONE  40 mg Oral Q breakfast  . sertraline  100 mg Oral QHS  . simvastatin  20 mg Oral q1800  . torsemide  20 mg Oral BID   Continuous Infusions:    Principal Problem:   HCAP (healthcare-associated pneumonia) Active Problems:   Type 2 diabetes mellitus with vascular disease   Obesity (BMI 30-39.9)   Chronic cor pulmonale   Chronic kidney disease (CKD), stage IV (severe)   Chronic respiratory failure   Acute-on-chronic respiratory failure   Tracheostomy status   SEMI (subendocardial myocardial infarction)    Time spent: 30 min    Thaddaeus Granja, [facility]  [facility] Pager [phone].  If 7PM-7AM, please contact night-coverage at [email or web address], password [facility] 07/16/2012, 2:51 PM  LOS: 8 days

## 2012-07-16 NOTE — Discharge Summary (Signed)
Physician Discharge Summary  BREI POCIASK ZOX:[id number] DOB: [birth year] DOA: 07/08/2012  PCP: Gwynneth Aliment, MD  Admit date: 07/08/2012 Discharge date: 07/17/2012  Recommendations for Outpatient Follow-up:  1. Follow up with cardiology within 2 weeks of discharge 2. Followup with pulmonology within 2 weeks of discharge 3. Followup with nephrology within 4 weeks of discharge  Discharge Diagnoses:  Principal Problem:   HCAP (healthcare-associated pneumonia) Active Problems:   Type 2 diabetes mellitus with vascular disease   Obesity (BMI 30-39.9)   Chronic cor pulmonale   Chronic kidney disease (CKD), stage IV (severe)   Chronic respiratory failure   Acute-on-chronic respiratory failure   Tracheostomy status   SEMI (subendocardial myocardial infarction)   Discharge Condition: Stable improved  Diet recommendation: Dysphagia 3  Wt Readings from Last 3 Encounters:  07/17/12 82.3 kg (181 lb 7 oz)  06/08/12 75.2 kg (165 lb 12.6 oz)  05/19/12 79.3 kg (174 lb 13.2 oz)    History of present illness:   72 yo AAF hx of cardiopulmonary arrest 01/2012 , s/p chronic trach , recurrent admission for trach issues, mucus plugging and HCAP (last admit 05/2011) . Brought to ER for resp distress and altered mental status found to by hypercarbic w/ PCO2 at 73. CCM asked to admit  Hospital Course:   Ms. Markov is admitted with acute on chronic hypercapnic respiratory failure due to health care associated pneumonia.  She did not require mechanical ventilation.  She was started on vancomycin and Zosyn.  Her blood cultures have been negative.  She completed a seven-day course of antibiotics.  She was given diuretics for presumed acute on chronic diastolic heart failure, and diuresis several liters of fluid with improvement in her respiratory status.  Some of her dyspnea is probably due to mucous plugging which results from her not taking out her speaking valve. She also does not clean her  speaking valve for her trach collar very frequently. This is a chronic ongoing problem for this patient and she required intermittent suctioning. She also has some baseline atelectasis which was worse during her admission. She refuses to work with physical therapy or be aggressive about ambulation and out of bed to improve her atelectasis.  Because of persistent dyspnea she was started on a steroid taper and scheduled nebulizer treatments.  She should continue a Kadyn Chovan steroid taper at home. At the time of discharge she is breathing well on 35% trach collar.    Elevated troponins in the setting of HCAP and mild CHF with acute on chronic respiratory failure. Troponins were considerably higher than the patient's baseline and may represent NSTEMI, type 2.  Peak troponin on May 21 was 1.42.  She was seen by cardiology who recommended continuing beta blocker,  Aspirin, statin, and Plavix.  She will need to be seen by them in the next couple of weeks as an outpatient for followup.  Acute on chronic systolic and diastolic heart failure.  Last echo 01/2012 w/ EF 45-50%, severe LV hypertrophy, LV diffuse hypokinesis, grade 1 diastolic dysfxn. LA mod dilated.  She is negative more than 7 L over the last week.  She diuresed well with oral torsemide.   HTN, blood pressure elevated.  She will be discharged on Norvasc, carvedilol, and were, hydralazine, and clonidine.  She is not on an ACE inhibitor due to her stage IV chronic kidney disease.  Her cardiologist and her nephrologist may need to adjust her medications in the next couple of weeks, but I anticipate that her blood  pressure will trend down as she comes off of her steroids.  She developed some diarrhea during the last couple of days of her admission. C. difficile was negative. She may use Imodium as needed at home.  CKD stage 4 baseline scr 2.6 -Scr stable.  She should continue torsemide and followup with her nephrologist.  GERD, dysphagia, stable. Continue  PPI and dysphagia 3  Anemia of chronic disease and iron deficiency, stable. Continue iron  Consultants:  Cardiology  PCCM SIGNIFICANT EVENTS / STUDIES:  5/17> quiet night, req suctioning x2 only, sput sent for C&S  5/18> calm, talking w/ Passey-Muir  ANTIBIOTICS:  5/17 Vanc > 5/23  5/17 Zosyn > 5/23 LINES / TUBES:  01/2012 Chronic trach >  CULTURES:  5/17 Sputum Cx >> normal OP flora  5/17 BC x 2 >> NGF   Discharge Exam: Filed Vitals:   07/17/12 1212  BP:   Pulse: 65  Temp:   Resp: 20   Filed Vitals:   07/17/12 0700 07/17/12 1003 07/17/12 1005 07/17/12 1212  BP:  181/83    Pulse:  62  65  Temp:      TempSrc:      Resp:    20  Height:      Weight: 82.3 kg (181 lb 7 oz)     SpO2:   95% 95%   Patient states that she still feels somewhat Kallee Nam of breath and is having some diarrhea.    General: AAF, no acute distress  HEENT: NCAT, MMM, trach in place 35% FIO2  Cardiovascular: RRR, nl S1, S2 no mrg, 2+ pulses, warm extremities  Respiratory: Diminished at bilateral bases into the mid back on the left.  No wheezes or rhonchi.  Better aeration today. Abdomen: NABS, soft, NT/ND  MSK: Normal tone and bulk, 1+ LEE  Neuro: Grossly intact  Discharge Instructions      Discharge Orders   Future Orders Complete By Expires     (HEART FAILURE PATIENTS) Call MD:  Anytime you have any of the following symptoms: 1) 3 pound weight gain in 24 hours or 5 pounds in 1 week 2) shortness of breath, with or without a dry hacking cough 3) swelling in the hands, feet or stomach 4) if you have to sleep on extra pillows at night in order to breathe.  As directed     Call MD for:  difficulty breathing, headache or visual disturbances  As directed     Call MD for:  extreme fatigue  As directed     Call MD for:  hives  As directed     Call MD for:  persistant dizziness or light-headedness  As directed     Call MD for:  persistant nausea and vomiting  As directed     Call MD for:  severe  uncontrolled pain  As directed     Call MD for:  temperature >100.4  As directed     Diet - low sodium heart healthy  As directed     Discharge instructions  As directed     Comments:      You were hospitalized with pneumonia and mucous plugging on the left side. You also had some lung compression on the left side.  You received a seven-day course of antibiotics for pneumonia.  Please continue to remove her speaking valve when you are sleeping at night. Clean your trach collar frequently.  Please sit up as much as possible and do as much activity as you can  tolerate as this will build up your endurance and improve your breathing.  If you become suddenly more Gisell Buehrle of breath or develop high fevers or chest pains please call [phone].  For your diarrhea you may take Imodium.  You were tested for her C. difficile diarrhea, and infectious kind of diarrhea it is common after antibiotics, and your test was negative.    Increase activity slowly  As directed         Medication List    STOP taking these medications       levofloxacin 250 MG tablet  Commonly known as:  LEVAQUIN     pravastatin 40 MG tablet  Commonly known as:  PRAVACHOL      TAKE these medications       albuterol (5 MG/ML) 0.5% nebulizer solution  Commonly known as:  PROVENTIL  Take 0.5 mLs (2.5 mg total) by nebulization every 4 (four) hours as needed for wheezing.     amLODipine 10 MG tablet  Commonly known as:  NORVASC  Take 10 mg by mouth daily.     arformoterol 15 MCG/2ML Nebu  Commonly known as:  BROVANA  Take 2 mLs (15 mcg total) by nebulization 2 (two) times daily.     aspirin 81 MG chewable tablet  Chew 1 tablet (81 mg total) by mouth daily.     atorvastatin 40 MG tablet  Commonly known as:  LIPITOR  Take 1 tablet (40 mg total) by mouth daily.     BIOTENE MOISTURIZING MOUTH Soln  Use as directed 1 spray in the mouth or throat daily.     budesonide 0.25 MG/2ML nebulizer solution  Commonly known as:  PULMICORT   Take 2 mLs (0.25 mg total) by nebulization 2 (two) times daily.     busPIRone 5 MG tablet  Commonly known as:  BUSPAR  Take 5 mg by mouth 2 (two) times daily.     carvedilol 6.25 MG tablet  Commonly known as:  COREG  Take 1 tablet (6.25 mg total) by mouth 2 (two) times daily with a meal.     cloNIDine 0.2 MG tablet  Commonly known as:  CATAPRES  Take 0.5 tablets (0.1 mg total) by mouth 2 (two) times daily.     clopidogrel 75 MG tablet  Commonly known as:  PLAVIX  Take 1 tablet (75 mg total) by mouth daily with breakfast.     dextromethorphan-guaiFENesin 30-600 MG per 12 hr tablet  Commonly known as:  MUCINEX DM  Take 1 tablet by mouth 2 (two) times daily.     ferrous sulfate 325 (65 FE) MG EC tablet  Take 1 tablet (325 mg total) by mouth 2 (two) times daily.     hydrALAZINE 100 MG tablet  Commonly known as:  APRESOLINE  Take 100 mg by mouth 3 (three) times daily.     ipratropium 0.02 % nebulizer solution  Commonly known as:  ATROVENT  Take 2.5 mLs (0.5 mg total) by nebulization 3 (three) times daily.     isosorbide mononitrate 30 MG 24 hr tablet  Commonly known as:  IMDUR  Take 1 tablet (30 mg total) by mouth daily.     metoCLOPramide 5 MG tablet  Commonly known as:  REGLAN  Take 5 mg by mouth 4 (four) times daily.     oxycodone 5 MG capsule  Commonly known as:  OXY-IR  Take 5 mg by mouth every 6 (six) hours as needed (for moderate pain).     pantoprazole 40  MG tablet  Commonly known as:  PROTONIX  Take 1 tablet (40 mg total) by mouth every evening.     predniSONE 10 MG tablet  Commonly known as:  DELTASONE  Take 4 tabs daily x 2 days, then 3 tabs daily x 2 days, then 2 tabs daily x 2 days, then 1 tab daily x 2 days, then stop.     sertraline 100 MG tablet  Commonly known as:  ZOLOFT  Take 100 mg by mouth at bedtime.     torsemide 20 MG tablet  Commonly known as:  DEMADEX  Take 20 mg by mouth 2 (two) times daily.     VITAMIN C PO  Take 1 tablet by mouth  daily.       Follow-up Information   Follow up with Gwynneth Aliment, MD. Schedule an appointment as soon as possible for a visit in 2 weeks.   Contact information:   [address] [place] [address] [phone]       Follow up with [facility]). Schedule an appointment as soon as possible for a visit in 2 weeks.   Contact information:   [address], [address] [place] [address] [phone]      Follow up with [facility]. Schedule an appointment as soon as possible for a visit in 2 weeks.   Contact information:   [address] [place] [address] [phone]      Follow up with [facility]. Schedule an appointment as soon as possible for a visit in 1 month.   Contact information:   [address] [place] [address] [phone]       The results of significant diagnostics from this hospitalization (including imaging, microbiology, ancillary and laboratory) are listed below for reference.    Significant Diagnostic Studies: Dg Chest Port 1 View  07/15/2012   *RADIOLOGY REPORT*  Clinical Data: Shortness of breath.  PORTABLE CHEST - 1 VIEW  Comparison: 07/12/2012  Findings: Heart is enlarged. There has been improvement in aeration of the left lung base.  There is pulmonary vascular congestion without overt edema.  Tracheostomy tube is in place.  No focal consolidations or pleural effusions.  There is minimal left midlung zone atelectasis or scarring.  IMPRESSION:  1.  Cardiomegaly. 2.  Left midlung zone scarring or atelectasis.   Original Report Authenticated By: Norva Pavlov, M.D.   Dg Chest Port 1 View  07/12/2012   *RADIOLOGY REPORT*  Clinical Data: Follow up left lower lobe process.  PORTABLE CHEST - 1 VIEW  Comparison: 07/11/2012.  Findings: The tracheostomy tube is stable.  The heart is enlarged but unchanged.  There is persistent left lower lobe opacity, likely a combination of  atelectasis, infiltrate and effusion.  Persistent right basilar atelectasis also.  IMPRESSION: Stable chest x-ray findings.   Original Report Authenticated By: Rudie Meyer, M.D.   Dg Chest Port 1 View  07/11/2012   *RADIOLOGY REPORT*  Clinical Data: Left lower lobe atelectasis.  PORTABLE CHEST - 1 VIEW  Comparison: 05/18 and 07/08/2012 and 06/05/2047  Findings: Increasing atelectasis/infiltrate at the left lung base. Pulmonary vascular prominence of the right lung base.  Tracheostomy tube is unchanged.  IMPRESSION:    Increasing infiltrate/atelectasis at the left lung base.   Original Report Authenticated By: Francene Boyers, M.D.   Dg Chest Port 1 View  07/09/2012   *RADIOLOGY REPORT*  Clinical Data: Evaluate pneumonia.  PORTABLE CHEST - 1 VIEW  Comparison:  07/08/2012  Findings: Tracheostomy is unchanged in position. Cardiomegaly accentuated by AP portable technique.  Atherosclerosis in the transverse aorta.  New or increased small left pleural effusion.  No pneumothorax.  Improved right base aeration.  Moderate left hemidiaphragm elevation.  Development of left lower lung airspace disease, superimposed upon chronic volume loss.  IMPRESSION: Improved right base aeration.  Development of left lower lung airspace disease, superimposed upon chronic volume loss.  Suspicious for infection or aspiration.  A small left pleural effusion is new or increased.   Original Report Authenticated By: Jeronimo Greaves, M.D.   Dg Chest Port 1 View  07/08/2012   *RADIOLOGY REPORT*  Clinical Data: Shortness of breath.  PORTABLE CHEST - 1 VIEW  Comparison: 06/04/2012 prior chest radiographs  Findings: Cardiomegaly and tracheostomy tube again noted. Elevation of the left hemidiaphragm with left basilar scarring again noted. Pulmonary vascular congestion is present. Right lower lung atelectasis versus airspace disease present. There is no evidence of pneumothorax or large pleural effusion.  IMPRESSION: Right lower lung atelectasis  versus airspace disease.  Cardiomegaly with pulmonary vascular congestion.  Left basilar scarring.   Original Report Authenticated By: Harmon Pier, M.D.    Microbiology: Recent Results (from the past 240 hour(s))  CULTURE, BLOOD (ROUTINE X 2)     Status: None   Collection Time    07/08/12 11:40 AM      Result Value Range Status   Specimen Description BLOOD LEFT ARM   Final   Special Requests BOTTLES DRAWN AEROBIC AND ANAEROBIC 10CC   Final   Culture  Setup Time 07/08/2012 19:00   Final   Culture NO GROWTH 5 DAYS   Final   Report Status 07/14/2012 FINAL   Final  CULTURE, BLOOD (ROUTINE X 2)     Status: None   Collection Time    07/08/12 12:00 PM      Result Value Range Status   Specimen Description BLOOD LEFT HAND   Final   Special Requests BOTTLES DRAWN AEROBIC ONLY 10CC   Final   Culture  Setup Time 07/08/2012 19:00   Final   Culture NO GROWTH 5 DAYS   Final   Report Status 07/14/2012 FINAL   Final  MRSA PCR SCREENING     Status: None   Collection Time    07/08/12  4:26 PM      Result Value Range Status   MRSA by PCR NEGATIVE  NEGATIVE Final   Comment:            The GeneXpert MRSA Assay (FDA     approved for NASAL specimens     only), is one component of a     comprehensive MRSA colonization     surveillance program. It is not     intended to diagnose MRSA     infection nor to guide or     monitor treatment for     MRSA infections.  CULTURE, RESPIRATORY (NON-EXPECTORATED)     Status: None   Collection Time    07/09/12  8:58 AM      Result Value Range Status   Specimen Description TRACHEAL ASPIRATE   Final   Special Requests NONE   Final   Gram Stain     Final   Value: MODERATE WBC PRESENT, PREDOMINANTLY PMN     NO SQUAMOUS EPITHELIAL CELLS SEEN     NO ORGANISMS SEEN   Culture Non-Pathogenic Oropharyngeal-type Flora Isolated.   Final   Report Status 07/11/2012 FINAL   Final  CLOSTRIDIUM DIFFICILE BY PCR     Status: None   Collection Time    07/17/12  3:15 AM       Result Value Range Status   C difficile by pcr NEGATIVE  NEGATIVE Final     Labs: Basic Metabolic Panel:  Recent Labs Lab 07/11/12 0420 07/11/12 2024 07/12/12 0230 07/13/12 0520 07/14/12 0500 07/15/12 0500 07/16/12 0600 07/17/12 0525  NA 140 138 138 141 140 141 136 135  K 3.9 4.5 4.2 3.9 3.9 3.9 4.3 4.4  CL 103 103 102 102 103 101 99 98  CO2 25 25 29 30  32 31 27 30   GLUCOSE 97 143* 84 92 92 105* 117* 93  BUN 30* 30* 30* 26* 23 29* 33* 42*  CREATININE 2.50* 2.51* 2.41* 2.45* 2.49* 2.53* 2.47* 2.52*  CALCIUM 8.5 8.5 8.3* 8.6 9.1 9.0 8.8 8.8  MG 1.9 2.0 1.8  --   --   --   --   --   PHOS 3.6  --  4.2  --   --   --   --   --    Liver Function Tests: No results found for this basename: AST, ALT, ALKPHOS, BILITOT, PROT, ALBUMIN,  in the last 168 hours No results found for this basename: LIPASE, AMYLASE,  in the last 168 hours No results found for this basename: AMMONIA,  in the last 168 hours CBC:  Recent Labs Lab 07/13/12 0520 07/14/12 0500 07/15/12 0500 07/16/12 0600 07/17/12 0525  WBC 7.1 8.1 8.4 7.3 8.4  HGB 8.6* 8.1* 8.0* 7.9* 7.9*  HCT 27.8* 26.6* 25.9* 25.2* 25.1*  MCV 82.0 82.4 82.0 79.7 81.5  PLT 207 218 210 212 212   Cardiac Enzymes:  Recent Labs Lab 07/11/12 2024 07/12/12 0230 07/12/12 0800 07/12/12 1815  TROPONINI 1.13* 1.39* 1.42* 0.87*   BNP: BNP (last 3 results)  Recent Labs  06/04/12 0932 07/08/12 1050 07/09/12 0615  PROBNP 8639.0* 8263.0* 9076.0*   CBG:  Recent Labs Lab 07/16/12 1205 07/16/12 1646 07/16/12 2109 07/17/12 0726 07/17/12 1138  GLUCAP 83 149* 177* 91 103*    Time coordinating discharge: 45 minutes  Signed:  Tahj Lindseth  [facility] 07/17/2012, 1:55 PM

## 2012-07-16 NOTE — Progress Notes (Signed)
Spoke with pt's dtr. Ms. Valda Favia re: pt being ready for d/c/family being home to receive her.  Ms. Valda Favia to call pt's other dtr., [name], to see if she is available to care for pt at d/c.  CSW waiting on ret. Call.

## 2012-07-17 LAB — BASIC METABOLIC PANEL
BUN: 42 mg/dL — ABNORMAL HIGH (ref 6–23)
Calcium: 8.8 mg/dL (ref 8.4–10.5)
Chloride: 98 mEq/L (ref 96–112)
Creatinine, Ser: 2.52 mg/dL — ABNORMAL HIGH (ref 0.50–1.10)
GFR calc Af Amer: 21 mL/min — ABNORMAL LOW (ref >90)

## 2012-07-17 LAB — GLUCOSE, CAPILLARY: Glucose-Capillary: 91 mg/dL (ref 70–99)

## 2012-07-17 LAB — CLOSTRIDIUM DIFFICILE BY PCR: Toxigenic C. Difficile by PCR: NEGATIVE

## 2012-07-17 LAB — CBC
HCT: 25.1 % — ABNORMAL LOW (ref 36.0–46.0)
MCHC: 31.5 g/dL (ref 30.0–36.0)
MCV: 81.5 fL (ref 78.0–100.0)
Platelets: 212 10*3/uL (ref 150–400)
RDW: 18.1 % — ABNORMAL HIGH (ref 11.5–15.5)
WBC: 8.4 10*3/uL (ref 4.0–10.5)

## 2012-07-17 MED ORDER — LOPERAMIDE HCL 2 MG PO CAPS: 2.0000 mg | ORAL_CAPSULE | ORAL | Status: DC | PRN

## 2012-07-17 NOTE — Progress Notes (Signed)
CSW called the two daughters [name] [phone] and Sunny Schlein [phone] to let them know that their mother is ready for d/c today. CSW also called another relative Rosalynn at [phone] to have her call CSW to please let me know that someone will be at the pt's home to receive her so pt can go home.  I left a message for them to call CSW ASAP. Will continue to follow this pt and her CSW needs.  Sherald Barge, LCSW-A Clinical Social Worker [phone]
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
## 2012-07-17 NOTE — Progress Notes (Signed)
  PT Cancellation Note  Patient Details Name: [name] MRN: [id number] DOB: 08-25-1940   Cancelled Treatment:    Reason Eval/Treat Not Completed: Other (comment) (Refused in am and pm)   [name],[name] 07/17/2012, 3:55 PM [facility] [phone] (pager)

## 2012-07-17 NOTE — Evaluation (Signed)
Occupational Therapy Evaluation Patient Details Name: [name] MRN: [id number] DOB: [birth year] Today's Date: 07/17/2012 Time: 4098-1191 OT Time Calculation (min): 20 min  OT Assessment / Plan / Recommendation Clinical Impression   Pt admitted with HCAP.  Pt has a chronic trach , recurrent admissions for trach issues, mucus plugging.  Pt declined ambulation to bathroom.  Self care likely near her pre-hospitalization baseline per pt's report.  Pt was living in her daughter's one bedroom home since her last admission and reliant on [facility] aide to assist with bathing and dressing and was able to ambulate to the bathroom. Pt does not want any DME as she is staying in her daughter's one bedroom home with plans to return to her own home with her daughter's assist. Will defer OT back to [facility].      OT Assessment  All further OT needs can be met in the next venue of care    Follow Up Recommendations  Home health OT    Barriers to Discharge      Equipment Recommendations   (pt declining )    Recommendations for Other Services    Frequency       Precautions / Restrictions Precautions Precautions: Fall Precaution Comments: Trach collar with PMV   Pertinent Vitals/Pain No pain.    ADL  Eating/Feeding: Independent Where Assessed - Eating/Feeding: Edge of bed Grooming: Wash/dry hands;Wash/dry face;Brushing hair;Set up Where Assessed - Grooming: Unsupported sitting Upper Body Bathing: Minimal assistance Where Assessed - Upper Body Bathing: Unsupported sitting Lower Body Bathing: Moderate assistance Where Assessed - Lower Body Bathing: Unsupported sitting;Supported sit to stand Upper Body Dressing: Set up Where Assessed - Upper Body Dressing: Unsupported sitting Lower Body Dressing: Moderate assistance Where Assessed - Lower Body Dressing: Unsupported sitting;Supported sit to stand Toilet Transfer: Supervision/safety Toilet Transfer Method: Surveyor, minerals:  Materials engineer and Hygiene: Moderate assistance Where Assessed - Toileting Clothing Manipulation and Hygiene: Standing ADL Comments: Fatigues easily with activity, particularly reaching feet.    OT Diagnosis: Generalized weakness  OT Problem List: Decreased activity tolerance;Impaired balance (sitting and/or standing);Decreased knowledge of use of DME or AE;Obesity;Cardiopulmonary status limiting activity OT Treatment Interventions:     OT Goals    Visit Information  Last OT Received On: 07/17/12    Subjective Data  Subjective: "I'm staying with my daughter in her 1 bedroom place." Patient Stated Goal: Home with daughter.   Prior Functioning     Home Living Lives With: Daughter Available Help at Discharge: Family;Available PRN/intermittently Type of Home: Apartment Home Access: Level entry Home Layout: One level Bathroom Shower/Tub:  (doesn't get in shower) Bathroom Toilet: Standard Home Adaptive Equipment: Straight cane;Walker - rolling;Wheelchair - manual [phone]) Additional Comments: Pt vague regarding home situation and ability for daughter to help her. Prior Function Level of Independence: Needs assistance Needs Assistance: Bathing;Dressing;Meal Prep;Light Housekeeping Bath: Minimal Dressing: Minimal Meal Prep: Total Light Housekeeping: Total Able to Take Stairs?: No Driving: No Vocation: Retired Musician: Restaurant manager, fast food: Right         Vision/Perception Vision - History Patient Visual Report: No change from baseline   Cognition  Cognition Arousal/Alertness: Awake/alert Behavior During Therapy: WFL for tasks assessed/performed Overall Cognitive Status: Within Functional Limits for tasks assessed    Extremity/Trunk Assessment Right Upper Extremity Assessment RUE ROM/Strength/Tone: WFL for tasks assessed RUE Coordination: WFL - gross/fine motor Left Upper Extremity  Assessment LUE ROM/Strength/Tone: WFL for tasks assessed LUE Coordination: WFL - gross/fine motor  Mobility Bed Mobility Bed Mobility: Not assessed (received seated EOB)      Exercise     Balance     End of Session OT - End of Session Activity Tolerance: Patient limited by fatigue Patient left: in bed;with call bell/phone within reach;with nursing in room  GO     Evern Bio 07/17/2012, 2:13 PM [phone]

## 2012-07-17 NOTE — Care Management Note (Signed)
Informed by pt RN that pt is ready for d/c, however unable to reach family yesterday. This CM confirmed with [facility] that all HH orders have been received and they are ready to resume care. [facility] await d/c of pt to home and will begin services.  Johny Shock RN MPH Case Manager [phone]

## 2012-07-17 NOTE — Progress Notes (Signed)
PICC Line removed per order. Line intact at 43 cm. Manual pressure held with no active bleeding noted. Vaseline gauze pressure dressing applied and secured.

## 2012-07-20 ENCOUNTER — Other Ambulatory Visit: Payer: Self-pay | Admitting: Internal Medicine

## 2012-07-29 ENCOUNTER — Emergency Department (HOSPITAL_COMMUNITY): Payer: Medicare Other

## 2012-07-29 ENCOUNTER — Emergency Department (HOSPITAL_COMMUNITY)
Admission: EM | Admit: 2012-07-29 | Discharge: 2012-07-29 | Disposition: A | Discharge status: Home / Self Care | Payer: Medicare Other | Attending: Emergency Medicine | Admitting: Emergency Medicine

## 2012-07-29 DIAGNOSIS — I279 Pulmonary heart disease, unspecified: Secondary | ICD-10-CM | POA: Insufficient documentation

## 2012-07-29 DIAGNOSIS — Z8673 Personal history of transient ischemic attack (TIA), and cerebral infarction without residual deficits: Secondary | ICD-10-CM | POA: Insufficient documentation

## 2012-07-29 DIAGNOSIS — J9500 Unspecified tracheostomy complication: Secondary | ICD-10-CM | POA: Insufficient documentation

## 2012-07-29 DIAGNOSIS — Z430 Encounter for attention to tracheostomy: Secondary | ICD-10-CM | POA: Insufficient documentation

## 2012-07-29 DIAGNOSIS — E669 Obesity, unspecified: Secondary | ICD-10-CM | POA: Insufficient documentation

## 2012-07-29 DIAGNOSIS — E119 Type 2 diabetes mellitus without complications: Secondary | ICD-10-CM | POA: Insufficient documentation

## 2012-07-29 DIAGNOSIS — Z8639 Personal history of other endocrine, nutritional and metabolic disease: Secondary | ICD-10-CM | POA: Insufficient documentation

## 2012-07-29 DIAGNOSIS — I110 Hypertensive heart disease with heart failure: Secondary | ICD-10-CM | POA: Insufficient documentation

## 2012-07-29 DIAGNOSIS — I999 Unspecified disorder of circulatory system: Secondary | ICD-10-CM | POA: Insufficient documentation

## 2012-07-29 DIAGNOSIS — N184 Chronic kidney disease, stage 4 (severe): Secondary | ICD-10-CM | POA: Insufficient documentation

## 2012-07-29 DIAGNOSIS — D696 Thrombocytopenia, unspecified: Secondary | ICD-10-CM | POA: Insufficient documentation

## 2012-07-29 DIAGNOSIS — Z862 Personal history of diseases of the blood and blood-forming organs and certain disorders involving the immune mechanism: Secondary | ICD-10-CM | POA: Insufficient documentation

## 2012-07-29 DIAGNOSIS — G4733 Obstructive sleep apnea (adult) (pediatric): Secondary | ICD-10-CM | POA: Insufficient documentation

## 2012-07-29 DIAGNOSIS — Z882 Allergy status to sulfonamides status: Secondary | ICD-10-CM | POA: Insufficient documentation

## 2012-07-29 DIAGNOSIS — I428 Other cardiomyopathies: Secondary | ICD-10-CM | POA: Insufficient documentation

## 2012-07-29 DIAGNOSIS — N189 Chronic kidney disease, unspecified: Secondary | ICD-10-CM | POA: Insufficient documentation

## 2012-07-29 DIAGNOSIS — J4489 Other specified chronic obstructive pulmonary disease: Secondary | ICD-10-CM | POA: Insufficient documentation

## 2012-07-29 DIAGNOSIS — K219 Gastro-esophageal reflux disease without esophagitis: Secondary | ICD-10-CM | POA: Insufficient documentation

## 2012-07-29 DIAGNOSIS — I509 Heart failure, unspecified: Secondary | ICD-10-CM | POA: Insufficient documentation

## 2012-07-29 DIAGNOSIS — E785 Hyperlipidemia, unspecified: Secondary | ICD-10-CM | POA: Insufficient documentation

## 2012-07-29 DIAGNOSIS — J9620 Acute and chronic respiratory failure, unspecified whether with hypoxia or hypercapnia: Secondary | ICD-10-CM | POA: Insufficient documentation

## 2012-07-29 DIAGNOSIS — D649 Anemia, unspecified: Secondary | ICD-10-CM | POA: Insufficient documentation

## 2012-07-29 DIAGNOSIS — J45909 Unspecified asthma, uncomplicated: Secondary | ICD-10-CM | POA: Insufficient documentation

## 2012-07-29 DIAGNOSIS — J449 Chronic obstructive pulmonary disease, unspecified: Secondary | ICD-10-CM | POA: Insufficient documentation

## 2012-07-29 NOTE — ED Notes (Signed)
Per EMS: Pt from home. Pt displaced catheter. EMS met resistance when trying to reinsert trach. No respiratory distress noted. VSS.

## 2012-07-29 NOTE — ED Notes (Signed)
MD Hyacinth Meeker at bedside. Trach removed. Pl at 100% on 4L with no acute distress noted. Resp tech at bedside assisting in reinsertion.

## 2012-07-29 NOTE — ED Notes (Signed)
4mm uncuffed placed in pt by MD Hyacinth Meeker. Good color change. Pt tolerated well. O2 stats maintaining at 100% 4L.

## 2012-07-29 NOTE — ED Provider Notes (Signed)
History     CSN: [id number]  Arrival date & time 07/29/12  1749   First MD Initiated Contact with Patient 07/29/12 1803      Chief Complaint  Patient presents with  . Tracheostomy Tube Change    (Consider location/radiation/quality/duration/timing/severity/associated sxs/prior treatment) HPI Comments: 72 year old female with a history of severe COPD with recurrent pneumothorax who presents with dislodgment of her tracheostomy tube. The patient states that this happened when she possibly tripped over her nasal cannula oxygen tubing, symptoms are mild, persistent, not associated with any other fevers chills coughing nausea vomiting or diarrhea.  This was acute in onset, persistent, nothing makes it better or worse  The history is provided by the patient and the EMS personnel.    Past Medical History  Diagnosis Date  . COPD (chronic obstructive pulmonary disease)     on home o2  . Chronic renal insufficiency   . CVA (cerebral infarction)     hx  . Hypertensive heart disease with congestive heart failure   . Chronic cor pulmonale   . Cardiomyopathy of undetermined type 09/07/2011  . Chronic kidney disease (CKD), stage IV (severe)   . Hyperlipdemia   . Obesity (BMI 30-39.9)   . Sleep apnea   . History of gout 09/07/2011  . Heart failure 7/13  . Shortness of breath   . Anemia   . Vascular disease   . Asthma   . Anginal pain   . Hypertension   . Type II or unspecified type diabetes mellitus without mention of complication, not stated as uncontrolled   . Stroke   . GERD (gastroesophageal reflux disease)   . Arthritis   . Bundle branch block   . Thrombocytopenia   . Acute and chronic respiratory failure     Past Surgical History  Procedure Laterality Date  . Bilateral oophorectomy    . Cardiac catheterization      right heart cath  . Laparoscopic gastrostomy  12/21/2011    Procedure: LAPAROSCOPIC GASTROSTOMY;  Surgeon: Axel Filler, MD;  Location: [facility] OR;  Service:  General;  Laterality: N/A;  laparoscopic gastrostomy possible open feeding tube  . Abdominal hysterectomy    . Tracheostomy  11/2011  . Cataracts    . Tracheostomy tube placement  02/18/2012    Procedure: TRACHEOSTOMY;  Surgeon: Serena Colonel, MD;  Location: [facility] OR;  Service: ENT;  Laterality: N/A;    Family History  Problem Relation Age of Onset  . Heart attack Father   . Stroke Mother     CVA  . Coronary artery disease Daughter     History  Substance Use Topics  . Smoking status: Former Smoker -- 1.00 packs/day for 55 years    Types: Cigarettes    Quit date: 08/28/2011  . Smokeless tobacco: Never Used  . Alcohol Use: No    OB History   Grav Para Term Preterm Abortions TAB SAB Ect Mult Living                  Review of Systems  All other systems reviewed and are negative.    Allergies  Other and Sulfonamide derivatives  Home Medications   Current Outpatient Rx  Name  Route  Sig  Dispense  Refill  . albuterol (PROVENTIL) (5 MG/ML) 0.5% nebulizer solution   Nebulization   Take 2.5 mg by nebulization every 4 (four) hours as needed for wheezing or shortness of breath.         Marland Kitchen amLODipine (NORVASC) 10 MG  tablet   Oral   Take 10 mg by mouth daily.         Marland Kitchen arformoterol (BROVANA) 15 MCG/2ML NEBU   Nebulization   Take 2 mLs (15 mcg total) by nebulization 2 (two) times daily.   120 mL   1   . Artificial Saliva (BIOTENE MOISTURIZING MOUTH) SOLN   Mouth/Throat   Use as directed 1 spray in the mouth or throat daily.         . Ascorbic Acid (VITAMIN C PO)   Oral   Take 1 tablet by mouth daily.         Marland Kitchen aspirin 81 MG chewable tablet   Oral   Chew 1 tablet (81 mg total) by mouth daily.         Marland Kitchen atorvastatin (LIPITOR) 40 MG tablet   Oral   Take 1 tablet (40 mg total) by mouth daily.   30 tablet   1   . budesonide (PULMICORT) 0.25 MG/2ML nebulizer solution   Nebulization   Take 2 mLs (0.25 mg total) by nebulization 2 (two) times daily.   60  mL   12   . busPIRone (BUSPAR) 5 MG tablet   Oral   Take 5 mg by mouth 2 (two) times daily.         . carvedilol (COREG) 3.125 MG tablet   Oral   Take 3.125 mg by mouth 2 (two) times daily with a meal.         . carvedilol (COREG) 6.25 MG tablet   Oral   Take 1 tablet (6.25 mg total) by mouth 2 (two) times daily with a meal.   60 tablet   1   . cloNIDine (CATAPRES) 0.2 MG tablet   Oral   Take 0.5 tablets (0.1 mg total) by mouth 2 (two) times daily.   60 tablet   11   . clopidogrel (PLAVIX) 75 MG tablet   Oral   Take 1 tablet (75 mg total) by mouth daily with breakfast.   30 tablet   11   . dextromethorphan-guaiFENesin (MUCINEX DM) 30-600 MG per 12 hr tablet   Oral   Take 1 tablet by mouth 2 (two) times daily.   60 tablet   0   . ferrous sulfate 325 (65 FE) MG EC tablet   Oral   Take 1 tablet (325 mg total) by mouth 2 (two) times daily.   60 tablet   3   . hydrALAZINE (APRESOLINE) 100 MG tablet   Oral   Take 100 mg by mouth 3 (three) times daily.         Marland Kitchen ipratropium (ATROVENT) 0.02 % nebulizer solution   Nebulization   Take 2.5 mLs (0.5 mg total) by nebulization 3 (three) times daily.   75 mL   12   . isosorbide mononitrate (IMDUR) 30 MG 24 hr tablet   Oral   Take 1 tablet (30 mg total) by mouth daily.   30 tablet   1   . metoCLOPramide (REGLAN) 5 MG tablet   Oral   Take 5 mg by mouth 4 (four) times daily.         Marland Kitchen oxycodone (OXY-IR) 5 MG capsule   Oral   Take 5 mg by mouth every 6 (six) hours as needed (for moderate pain).         . pantoprazole (PROTONIX) 40 MG tablet   Oral   Take 1 tablet (40 mg total) by mouth every evening.  30 tablet   0   . sertraline (ZOLOFT) 100 MG tablet   Oral   Take 100 mg by mouth at bedtime.         . torsemide (DEMADEX) 20 MG tablet   Oral   Take 20 mg by mouth daily.           BP 141/58  Pulse 60  Temp(Src) 98.4 F (36.9 C) (Oral)  Resp 22  SpO2 100%  Physical Exam  Nursing note  and vitals reviewed. Constitutional: She appears well-developed and well-nourished. No distress.  HENT:  Head: Normocephalic and atraumatic.  Mouth/Throat: Oropharynx is clear and moist. No oropharyngeal exudate.  Eyes: Conjunctivae and EOM are normal. Pupils are equal, round, and reactive to light. Right eye exhibits no discharge. Left eye exhibits no discharge. No scleral icterus.  Neck: Normal range of motion. Neck supple. No JVD present. No thyromegaly present.  Tracheostomy site is clean, no active bleeding, no discharge, patent, tracheostomy tube has dislodged  Cardiovascular: Normal rate, regular rhythm, normal heart sounds and intact distal pulses.  Exam reveals no gallop and no friction rub.   No murmur heard. Pulmonary/Chest: Effort normal and breath sounds normal. No respiratory distress. She has no wheezes. She has no rales.  Abdominal: Soft. Bowel sounds are normal. She exhibits no distension and no mass. There is no tenderness.  Musculoskeletal: Normal range of motion. She exhibits no edema and no tenderness.  Lymphadenopathy:    She has no cervical adenopathy.  Neurological: She is alert. Coordination normal.  Skin: Skin is warm and dry. No rash noted. No erythema.  Psychiatric: She has a normal mood and affect. Her behavior is normal.    ED Course  Procedures (including critical care time)  Labs Reviewed - No data to display Dg Chest [facility] 1 View  07/29/2012   *RADIOLOGY REPORT*  Clinical Data: 72 year old female tracheostomy replacement.  PORTABLE CHEST - 1 VIEW  Comparison: 07/15/2012 and earlier.  Findings: AP portable semi upright view 1820 hours.  Tracheostomy tube projects in the midline and appears adequately positioned. Stable lung volumes. Stable cardiomegaly and mediastinal contours. Interval mildly decreased retrocardiac opacity with loss of definition of the left hemidiaphragm.  No pneumothorax or pulmonary edema.  No definite effusion.  IMPRESSION: 1.  Tracheostomy  tube in place with no adverse features. 2.  Chronic cardiomegaly and mildly decreased retrocardiac ventilation.   Original Report Authenticated By: Erskine Speed, M.D.     1. Tracheostomy complication       MDM  The patient is not appear ill, she is not uncomfortable, she is not having any shortness of breath. I have replaced her tracheostomy tube with a replacement though I was only able to get a size 4 into that opening. Will discuss with ENT after imaging. Patient appears stable, oxygenating at 100% on baseline oxygen by nasal cannula.   I personally replaced the patient's tracheostomy tube, it has been replaced with a number 4, as well seated in the neck, x-ray confirms his position in the trachea, patient has no complaints, well appearing, normal oxygenation.  INTUBATION / Trach replacement Performed by: Vida Roller  Required items: required blood products, implants, devices, and special equipment available Patient identity confirmed: provided demographic data and hospital-assigned identification number Time out: Immediately prior to procedure a "time out" was called to verify the correct patient, procedure, equipment, support staff and site/side marked as required.  Indications: tracheostomy tube dislodgment  Intubation method: direct visualization of tracheostomy  Preoxygenation:  nasal cannula  Tube Size: 4 cuffed  Post-procedure assessment: chest rise and ETCO2 monitor Breath sounds: equal and absent over the epigastrium Tube secured with: ETT holder Chest x-ray interpreted by radiologist and me.  Chest x-ray findings: tracheal tube in appropriate position  Patient tolerated the procedure well with no immediate complications.     Discussed with Dr. Pollyann Kennedy of ENT who agrees with disposition of the patient, they can followup in the office within a week.     Vida Roller, MD 07/29/12 2014

## 2012-07-29 NOTE — ED Notes (Signed)
Pt sitting up, resting comfortably in bed watching television. No acute distress.

## 2012-08-02 ENCOUNTER — Other Ambulatory Visit: Payer: Self-pay | Admitting: Internal Medicine

## 2012-08-10 ENCOUNTER — Other Ambulatory Visit: Payer: Self-pay | Admitting: Internal Medicine

## 2012-08-20 ENCOUNTER — Inpatient Hospital Stay (HOSPITAL_COMMUNITY)
Admission: EM | Admit: 2012-08-20 | Discharge: 2012-09-14 | DRG: 166 | Disposition: A | Discharge status: Home Health | Payer: Medicare Other | Attending: Internal Medicine | Admitting: Internal Medicine

## 2012-08-20 ENCOUNTER — Encounter (HOSPITAL_COMMUNITY): Payer: Self-pay | Admitting: *Deleted

## 2012-08-20 ENCOUNTER — Emergency Department (HOSPITAL_COMMUNITY): Payer: Medicare Other

## 2012-08-20 DIAGNOSIS — N189 Chronic kidney disease, unspecified: Secondary | ICD-10-CM | POA: Diagnosis present

## 2012-08-20 DIAGNOSIS — F4323 Adjustment disorder with mixed anxiety and depressed mood: Secondary | ICD-10-CM

## 2012-08-20 DIAGNOSIS — I110 Hypertensive heart disease with heart failure: Secondary | ICD-10-CM

## 2012-08-20 DIAGNOSIS — M109 Gout, unspecified: Secondary | ICD-10-CM

## 2012-08-20 DIAGNOSIS — I509 Heart failure, unspecified: Secondary | ICD-10-CM

## 2012-08-20 DIAGNOSIS — I428 Other cardiomyopathies: Secondary | ICD-10-CM | POA: Diagnosis present

## 2012-08-20 DIAGNOSIS — D649 Anemia, unspecified: Secondary | ICD-10-CM

## 2012-08-20 DIAGNOSIS — D509 Iron deficiency anemia, unspecified: Secondary | ICD-10-CM | POA: Diagnosis present

## 2012-08-20 DIAGNOSIS — I5023 Acute on chronic systolic (congestive) heart failure: Secondary | ICD-10-CM

## 2012-08-20 DIAGNOSIS — M129 Arthropathy, unspecified: Secondary | ICD-10-CM | POA: Diagnosis present

## 2012-08-20 DIAGNOSIS — F4321 Adjustment disorder with depressed mood: Secondary | ICD-10-CM | POA: Diagnosis present

## 2012-08-20 DIAGNOSIS — Z930 Tracheostomy status: Secondary | ICD-10-CM

## 2012-08-20 DIAGNOSIS — I130 Hypertensive heart and chronic kidney disease with heart failure and stage 1 through stage 4 chronic kidney disease, or unspecified chronic kidney disease: Secondary | ICD-10-CM | POA: Diagnosis present

## 2012-08-20 DIAGNOSIS — J4489 Other specified chronic obstructive pulmonary disease: Secondary | ICD-10-CM | POA: Diagnosis present

## 2012-08-20 DIAGNOSIS — Z87891 Personal history of nicotine dependence: Secondary | ICD-10-CM

## 2012-08-20 DIAGNOSIS — I2510 Atherosclerotic heart disease of native coronary artery without angina pectoris: Secondary | ICD-10-CM

## 2012-08-20 DIAGNOSIS — E1159 Type 2 diabetes mellitus with other circulatory complications: Secondary | ICD-10-CM

## 2012-08-20 DIAGNOSIS — E119 Type 2 diabetes mellitus without complications: Secondary | ICD-10-CM | POA: Diagnosis present

## 2012-08-20 DIAGNOSIS — E662 Morbid (severe) obesity with alveolar hypoventilation: Secondary | ICD-10-CM | POA: Diagnosis present

## 2012-08-20 DIAGNOSIS — D638 Anemia in other chronic diseases classified elsewhere: Secondary | ICD-10-CM | POA: Diagnosis present

## 2012-08-20 DIAGNOSIS — Z515 Encounter for palliative care: Secondary | ICD-10-CM

## 2012-08-20 DIAGNOSIS — N289 Disorder of kidney and ureter, unspecified: Secondary | ICD-10-CM

## 2012-08-20 DIAGNOSIS — I5043 Acute on chronic combined systolic (congestive) and diastolic (congestive) heart failure: Secondary | ICD-10-CM | POA: Diagnosis present

## 2012-08-20 DIAGNOSIS — F17200 Nicotine dependence, unspecified, uncomplicated: Secondary | ICD-10-CM

## 2012-08-20 DIAGNOSIS — R627 Adult failure to thrive: Secondary | ICD-10-CM | POA: Diagnosis present

## 2012-08-20 DIAGNOSIS — J9610 Chronic respiratory failure, unspecified whether with hypoxia or hypercapnia: Secondary | ICD-10-CM

## 2012-08-20 DIAGNOSIS — J189 Pneumonia, unspecified organism: Secondary | ICD-10-CM

## 2012-08-20 DIAGNOSIS — J9500 Unspecified tracheostomy complication: Secondary | ICD-10-CM | POA: Diagnosis not present

## 2012-08-20 DIAGNOSIS — I452 Bifascicular block: Secondary | ICD-10-CM

## 2012-08-20 DIAGNOSIS — J449 Chronic obstructive pulmonary disease, unspecified: Secondary | ICD-10-CM

## 2012-08-20 DIAGNOSIS — I214 Non-ST elevation (NSTEMI) myocardial infarction: Secondary | ICD-10-CM | POA: Diagnosis present

## 2012-08-20 DIAGNOSIS — K219 Gastro-esophageal reflux disease without esophagitis: Secondary | ICD-10-CM | POA: Diagnosis present

## 2012-08-20 DIAGNOSIS — E669 Obesity, unspecified: Secondary | ICD-10-CM | POA: Diagnosis present

## 2012-08-20 DIAGNOSIS — I252 Old myocardial infarction: Secondary | ICD-10-CM

## 2012-08-20 DIAGNOSIS — I279 Pulmonary heart disease, unspecified: Secondary | ICD-10-CM | POA: Diagnosis present

## 2012-08-20 DIAGNOSIS — J9620 Acute and chronic respiratory failure, unspecified whether with hypoxia or hypercapnia: Principal | ICD-10-CM | POA: Diagnosis present

## 2012-08-20 DIAGNOSIS — N184 Chronic kidney disease, stage 4 (severe): Secondary | ICD-10-CM | POA: Diagnosis present

## 2012-08-20 DIAGNOSIS — R5381 Other malaise: Secondary | ICD-10-CM | POA: Diagnosis present

## 2012-08-20 DIAGNOSIS — R531 Weakness: Secondary | ICD-10-CM

## 2012-08-20 DIAGNOSIS — K7460 Unspecified cirrhosis of liver: Secondary | ICD-10-CM

## 2012-08-20 DIAGNOSIS — Z9981 Dependence on supplemental oxygen: Secondary | ICD-10-CM

## 2012-08-20 DIAGNOSIS — R0600 Dyspnea, unspecified: Secondary | ICD-10-CM

## 2012-08-20 DIAGNOSIS — G9340 Encephalopathy, unspecified: Secondary | ICD-10-CM

## 2012-08-20 DIAGNOSIS — K3184 Gastroparesis: Secondary | ICD-10-CM | POA: Diagnosis present

## 2012-08-20 DIAGNOSIS — G4733 Obstructive sleep apnea (adult) (pediatric): Secondary | ICD-10-CM

## 2012-08-20 DIAGNOSIS — Z8673 Personal history of transient ischemic attack (TIA), and cerebral infarction without residual deficits: Secondary | ICD-10-CM

## 2012-08-20 DIAGNOSIS — J45909 Unspecified asthma, uncomplicated: Secondary | ICD-10-CM

## 2012-08-20 DIAGNOSIS — N179 Acute kidney failure, unspecified: Secondary | ICD-10-CM | POA: Diagnosis present

## 2012-08-20 DIAGNOSIS — E785 Hyperlipidemia, unspecified: Secondary | ICD-10-CM

## 2012-08-20 HISTORY — DX: Obesity, unspecified: E66.9

## 2012-08-20 LAB — CBC WITH DIFFERENTIAL/PLATELET
Eosinophils Absolute: 0.1 10*3/uL (ref 0.0–0.7)
Hemoglobin: 9.1 g/dL — ABNORMAL LOW (ref 12.0–15.0)
Lymphocytes Relative: 19 % (ref 12–46)
Lymphs Abs: 1.8 10*3/uL (ref 0.7–4.0)
MCH: 25.9 pg — ABNORMAL LOW (ref 26.0–34.0)
Monocytes Relative: 11 % (ref 3–12)
Neutrophils Relative %: 69 % (ref 43–77)
RBC: 3.52 MIL/uL — ABNORMAL LOW (ref 3.87–5.11)

## 2012-08-20 LAB — GLUCOSE, CAPILLARY: Glucose-Capillary: 100 mg/dL — ABNORMAL HIGH (ref 70–99)

## 2012-08-20 LAB — BASIC METABOLIC PANEL
BUN: 40 mg/dL — ABNORMAL HIGH (ref 6–23)
CO2: 26 mEq/L (ref 19–32)
Chloride: 109 mEq/L (ref 96–112)
GFR calc non Af Amer: 16 mL/min — ABNORMAL LOW (ref >90)
Glucose, Bld: 99 mg/dL (ref 70–99)
Potassium: 3.7 mEq/L (ref 3.5–5.1)

## 2012-08-20 LAB — TROPONIN I
Troponin I: <0.3 ng/mL (ref <0.30)
Troponin I: <0.3 ng/mL (ref <0.30)

## 2012-08-20 MED ORDER — FUROSEMIDE 10 MG/ML IJ SOLN
40.0000 mg | Freq: Once | INTRAMUSCULAR | Status: AC
  Administered 2012-08-20: 40 mg via INTRAVENOUS
  Filled 2012-08-20: qty 4

## 2012-08-20 MED ORDER — METOCLOPRAMIDE HCL 5 MG PO TABS
5.0000 mg | ORAL_TABLET | Freq: Four times a day (QID) | ORAL | Status: DC
  Administered 2012-08-20 – 2012-09-14 (×87): 5 mg via ORAL
  Filled 2012-08-20 (×106): qty 1

## 2012-08-20 MED ORDER — CARVEDILOL 6.25 MG PO TABS
6.2500 mg | ORAL_TABLET | Freq: Two times a day (BID) | ORAL | Status: DC
  Administered 2012-08-20 – 2012-08-27 (×15): 6.25 mg via ORAL
  Filled 2012-08-20 (×17): qty 1

## 2012-08-20 MED ORDER — FERROUS SULFATE 325 (65 FE) MG PO TABS
325.0000 mg | ORAL_TABLET | Freq: Two times a day (BID) | ORAL | Status: DC
  Administered 2012-08-20 – 2012-09-14 (×46): 325 mg via ORAL
  Filled 2012-08-20 (×55): qty 1

## 2012-08-20 MED ORDER — VITAMIN C 500 MG PO TABS
500.0000 mg | ORAL_TABLET | Freq: Every day | ORAL | Status: DC
  Administered 2012-08-20 – 2012-09-14 (×24): 500 mg via ORAL
  Filled 2012-08-20 (×26): qty 1

## 2012-08-20 MED ORDER — IPRATROPIUM BROMIDE 0.02 % IN SOLN: 0.5000 mg | Freq: Three times a day (TID) | RESPIRATORY_TRACT | Status: DC

## 2012-08-20 MED ORDER — ASPIRIN 81 MG PO CHEW
81.0000 mg | CHEWABLE_TABLET | Freq: Every day | ORAL | Status: DC
  Administered 2012-08-20 – 2012-09-14 (×24): 81 mg via ORAL
  Filled 2012-08-20 (×23): qty 1

## 2012-08-20 MED ORDER — HYDRALAZINE HCL 50 MG PO TABS
100.0000 mg | ORAL_TABLET | Freq: Three times a day (TID) | ORAL | Status: DC
  Administered 2012-08-20 – 2012-09-14 (×67): 100 mg via ORAL
  Filled 2012-08-20 (×51): qty 2
  Filled 2012-08-20: qty 1
  Filled 2012-08-20 (×25): qty 2
  Filled 2012-08-20: qty 1
  Filled 2012-08-20 (×2): qty 2

## 2012-08-20 MED ORDER — ALBUTEROL SULFATE (5 MG/ML) 0.5% IN NEBU
2.5000 mg | INHALATION_SOLUTION | Freq: Four times a day (QID) | RESPIRATORY_TRACT | Status: DC
  Administered 2012-08-20 – 2012-09-14 (×100): 2.5 mg via RESPIRATORY_TRACT
  Filled 2012-08-20 (×102): qty 0.5

## 2012-08-20 MED ORDER — ATORVASTATIN CALCIUM 40 MG PO TABS
40.0000 mg | ORAL_TABLET | Freq: Every day | ORAL | Status: DC
  Administered 2012-08-20 – 2012-09-14 (×24): 40 mg via ORAL
  Filled 2012-08-20 (×26): qty 1

## 2012-08-20 MED ORDER — ARTIFICIAL SALIVA MT SOLN
1.0000 | Freq: Every day | OROMUCOSAL | Status: DC
  Filled 2012-08-20 (×2): qty 45

## 2012-08-20 MED ORDER — PANTOPRAZOLE SODIUM 40 MG PO TBEC
40.0000 mg | DELAYED_RELEASE_TABLET | Freq: Every day | ORAL | Status: DC
Start: 2012-08-20 — End: 2012-09-15
  Administered 2012-08-20 – 2012-09-14 (×24): 40 mg via ORAL
  Filled 2012-08-20 (×22): qty 1

## 2012-08-20 MED ORDER — SERTRALINE HCL 100 MG PO TABS
100.0000 mg | ORAL_TABLET | Freq: Every day | ORAL | Status: DC
  Administered 2012-08-20 – 2012-09-13 (×21): 100 mg via ORAL
  Filled 2012-08-20 (×27): qty 1

## 2012-08-20 MED ORDER — OXYCODONE HCL 5 MG PO TABS
5.0000 mg | ORAL_TABLET | Freq: Four times a day (QID) | ORAL | Status: DC | PRN
  Administered 2012-08-20 – 2012-09-08 (×14): 5 mg via ORAL
  Filled 2012-08-20 (×14): qty 1

## 2012-08-20 MED ORDER — DM-GUAIFENESIN ER 30-600 MG PO TB12
1.0000 | ORAL_TABLET | Freq: Two times a day (BID) | ORAL | Status: DC
  Administered 2012-08-20 – 2012-09-01 (×25): 1 via ORAL
  Filled 2012-08-20 (×32): qty 1

## 2012-08-20 MED ORDER — ARFORMOTEROL TARTRATE 15 MCG/2ML IN NEBU
15.0000 ug | INHALATION_SOLUTION | Freq: Two times a day (BID) | RESPIRATORY_TRACT | Status: DC
  Administered 2012-08-20 – 2012-08-29 (×19): 15 ug via RESPIRATORY_TRACT
  Filled 2012-08-20 (×21): qty 2

## 2012-08-20 MED ORDER — ACETAMINOPHEN 325 MG PO TABS
650.0000 mg | ORAL_TABLET | Freq: Four times a day (QID) | ORAL | Status: DC | PRN
  Administered 2012-08-29 – 2012-09-13 (×3): 650 mg via ORAL
  Filled 2012-08-20 (×3): qty 2

## 2012-08-20 MED ORDER — SODIUM CHLORIDE 0.9 % IJ SOLN
3.0000 mL | Freq: Two times a day (BID) | INTRAMUSCULAR | Status: DC
  Administered 2012-08-20 – 2012-09-14 (×45): 3 mL via INTRAVENOUS

## 2012-08-20 MED ORDER — ENOXAPARIN SODIUM 30 MG/0.3ML ~~LOC~~ SOLN
30.0000 mg | SUBCUTANEOUS | Status: DC
  Administered 2012-08-20 – 2012-09-13 (×24): 30 mg via SUBCUTANEOUS
  Filled 2012-08-20 (×27): qty 0.3

## 2012-08-20 MED ORDER — ONDANSETRON HCL 4 MG PO TABS: 4.0000 mg | ORAL_TABLET | Freq: Four times a day (QID) | ORAL | Status: DC | PRN

## 2012-08-20 MED ORDER — IPRATROPIUM BROMIDE 0.02 % IN SOLN
0.5000 mg | Freq: Four times a day (QID) | RESPIRATORY_TRACT | Status: DC
  Administered 2012-08-20 – 2012-09-14 (×99): 0.5 mg via RESPIRATORY_TRACT
  Filled 2012-08-20 (×102): qty 2.5

## 2012-08-20 MED ORDER — ONDANSETRON HCL 4 MG/2ML IJ SOLN: 4.0000 mg | Freq: Three times a day (TID) | INTRAMUSCULAR | Status: DC | PRN

## 2012-08-20 MED ORDER — AMLODIPINE BESYLATE 10 MG PO TABS
10.0000 mg | ORAL_TABLET | Freq: Every day | ORAL | Status: DC
  Administered 2012-08-20 – 2012-09-14 (×24): 10 mg via ORAL
  Filled 2012-08-20 (×26): qty 1

## 2012-08-20 MED ORDER — ISOSORBIDE MONONITRATE ER 30 MG PO TB24
30.0000 mg | ORAL_TABLET | Freq: Every day | ORAL | Status: DC
  Administered 2012-08-20 – 2012-09-14 (×24): 30 mg via ORAL
  Filled 2012-08-20 (×26): qty 1

## 2012-08-20 MED ORDER — BUDESONIDE 0.25 MG/2ML IN SUSP
0.2500 mg | Freq: Two times a day (BID) | RESPIRATORY_TRACT | Status: DC
  Administered 2012-08-20 – 2012-08-29 (×19): 0.25 mg via RESPIRATORY_TRACT
  Filled 2012-08-20 (×22): qty 2

## 2012-08-20 MED ORDER — CLOPIDOGREL BISULFATE 75 MG PO TABS
75.0000 mg | ORAL_TABLET | Freq: Every day | ORAL | Status: DC
  Administered 2012-08-20 – 2012-09-14 (×22): 75 mg via ORAL
  Filled 2012-08-20 (×28): qty 1

## 2012-08-20 MED ORDER — OXYCODONE HCL 5 MG PO TABS
5.0000 mg | ORAL_TABLET | Freq: Four times a day (QID) | ORAL | Status: DC | PRN
  Filled 2012-08-20: qty 1

## 2012-08-20 MED ORDER — METHYLPREDNISOLONE SODIUM SUCC 125 MG IJ SOLR
60.0000 mg | Freq: Every day | INTRAMUSCULAR | Status: DC
  Administered 2012-08-20 – 2012-08-21 (×2): 60 mg via INTRAVENOUS
  Filled 2012-08-20 (×2): qty 0.96

## 2012-08-20 MED ORDER — BIOTENE DRY MOUTH MT LIQD
15.0000 mL | Freq: Every day | OROMUCOSAL | Status: DC
  Administered 2012-08-20 – 2012-09-11 (×23): 15 mL via OROMUCOSAL

## 2012-08-20 MED ORDER — CLONIDINE HCL 0.1 MG PO TABS
0.1000 mg | ORAL_TABLET | Freq: Two times a day (BID) | ORAL | Status: DC
  Administered 2012-08-20 – 2012-09-14 (×46): 0.1 mg via ORAL
  Filled 2012-08-20 (×53): qty 1

## 2012-08-20 MED ORDER — ACETAMINOPHEN 650 MG RE SUPP: 650.0000 mg | Freq: Four times a day (QID) | RECTAL | Status: DC | PRN

## 2012-08-20 MED ORDER — ALBUTEROL SULFATE (5 MG/ML) 0.5% IN NEBU
2.5000 mg | INHALATION_SOLUTION | RESPIRATORY_TRACT | Status: AC | PRN
  Administered 2012-08-20: 2.5 mg via RESPIRATORY_TRACT
  Filled 2012-08-20 (×2): qty 0.5

## 2012-08-20 MED ORDER — ONDANSETRON HCL 4 MG/2ML IJ SOLN
4.0000 mg | Freq: Four times a day (QID) | INTRAMUSCULAR | Status: DC | PRN
  Administered 2012-09-03: 4 mg via INTRAVENOUS

## 2012-08-20 MED ORDER — BUSPIRONE HCL 5 MG PO TABS
5.0000 mg | ORAL_TABLET | Freq: Two times a day (BID) | ORAL | Status: DC
  Administered 2012-08-20 – 2012-09-14 (×46): 5 mg via ORAL
  Filled 2012-08-20 (×54): qty 1

## 2012-08-20 NOTE — H&P (Signed)
[facility] History and Physical  [name] ZOX:[id number] DOB: Oct 03, 1940 DOA: 08/20/2012   PCP: Gwynneth Aliment, MD   Chief Complaint: Shortness of breath  HPI:  72 y.o. female w/ h/o cardio pulmonary arrest in 01/2012 , s/p trach, s/p recent admission for acute on chronic respiratory failure from 07/08/2012-07/17/2012. The patient has a very competent medical history including COPD / OSA / prev SNF pt that had hypercarbic respiratory failure and had issues of recurrent intubation That underwent percutaneous tracheostomy 11/23/11 by Dr. Tyson Alias  Had trach dislodgement 01/2012 w/ track closure. Refused trach -redo but then suffered asystolic cardiac arrest. She was recently seen in the emergency department on 07/29/2012 with dislodgment of her tracheostomy tube. It was reinserted and the patient was sent home.  The patient presents today with two-day history of worsening shortness of breath. The patient endorses compliance with cleaning her Passy-Muir valve and suctioning with which she has had compliance issues with in the past leading to chronic mucous plugging and respiratory distress. However, the patient states that she has been drinking more fluids than usual, and states that she feels that she has gained a few pounds, but cannot quantify,. She endorses compliance with all her other medications. She denies any fevers but complains of subjective chills. She complains of increasing cough without hemoptysis. She denies any chest discomfort, vomiting, diarrhea, abdominal pain, dysuria. She does have some chronic nausea although she feels hungry this morning. In emergency department, the patient was given 40 mg of intravenous furosemide, but the patient stated that she continued to be short of breath. As a result, [facility] was asked to admit the patient. At the time of my evaluation, the patient was not in any respiratory distress although she did complain of continued shortness of breath.  She stated that it was 25% better than the past few days. Additional labs revealed WBC 9.2, serum creatinine 2.8 to, proBNP 10,275. Her serum creatinine and proBNP are higher than baseline. On the day of discharge on 07/17/2012, the patient had a weight of 82.3 kg. She has not yet had a weight performed during this admission.  Assessment/Plan: Acute on chronic respiratory failure -This is likely multifactorial including acute on chronic combined CHF, cor pulmonale, COPD, mucus plugging, OSA, and deconditioning -The patient currently does not have any respiratory distress -Will address the above issues as discussed below Acute on chronic combined CHF -Clinically, the patient appears fluid overloaded -Although her chest x-ray does not suggest significant pulmonary edema, I suspect that she mostly has right-sided CHF, cor pulmonale -She has significant peripheral edema and JVD -She has received one dose of intravenous furosemide in the emergency department -Will give her an additional dose of furosemide this evening IV and reassess the patient'fluid status in the morning given the patient's worsening renal function -Echocardiogram on 07/13/2012 shows EF 45%, grade 2 diastolic dysfunction COPD/cor pulmonale  -Does not appear to be decompensated although she does have some scattered wheezing  -I will give her one dose of intravenous Solu-Medrol  -Aerosolized albuterol and Atrovent as well as Brovana -Aggressive pulmonary toilet -Physical therapy Acute on chronic renal failure, CKD stage IV  -Will diurese judiciously  -Monitor renal function  History of NSTEMI  (07/08/2012 admission) -Cycle troponins  -Continue aspirin, Plavix, statin  Hypertension  -Continue amlodipine, carvedilol, hydralazine, clonidine Anemia -Multifactorial including anemia of chronic disease and iron deficiency -Continue iron GERD -Continue PPI       Past Medical History  Diagnosis Date  . COPD (chronic  obstructive pulmonary disease)     on home o2  . Chronic renal insufficiency   . CVA (cerebral infarction)     hx  . Hypertensive heart disease with congestive heart failure   . Chronic cor pulmonale   . Cardiomyopathy of undetermined type 09/07/2011  . Chronic kidney disease (CKD), stage IV (severe)   . Hyperlipdemia   . Obesity (BMI 30-39.9)   . Sleep apnea   . History of gout 09/07/2011  . Heart failure 7/13  . Shortness of breath   . Anemia   . Vascular disease   . Asthma   . Anginal pain   . Hypertension   . Type II or unspecified type diabetes mellitus without mention of complication, not stated as uncontrolled   . Stroke   . GERD (gastroesophageal reflux disease)   . Arthritis   . Bundle branch block   . Thrombocytopenia   . Acute and chronic respiratory failure    Past Surgical History  Procedure Laterality Date  . Bilateral oophorectomy    . Cardiac catheterization      right heart cath  . Laparoscopic gastrostomy  12/21/2011    Procedure: LAPAROSCOPIC GASTROSTOMY;  Surgeon: Axel Filler, MD;  Location: [facility] OR;  Service: General;  Laterality: N/A;  laparoscopic gastrostomy possible open feeding tube  . Abdominal hysterectomy    . Tracheostomy  11/2011  . Cataracts    . Tracheostomy tube placement  02/18/2012    Procedure: TRACHEOSTOMY;  Surgeon: Serena Colonel, MD;  Location: [facility] OR;  Service: ENT;  Laterality: N/A;   Social History:  reports that she quit smoking about a year ago. Her smoking use included Cigarettes. She has a 55 pack-year smoking history. She has never used smokeless tobacco. She reports that she does not drink alcohol or use illicit drugs.   Family History  Problem Relation Age of Onset  . Heart attack Father   . Stroke Mother     CVA  . Coronary artery disease Daughter      Allergies  Allergen Reactions  . Other Itching    "Wool"  . Sulfonamide Derivatives Hives and Rash      Prior to Admission medications   Medication Sig Start  Date End Date Taking? Authorizing Provider  albuterol (PROVENTIL) (5 MG/ML) 0.5% nebulizer solution Take 2.5 mg by nebulization every 4 (four) hours as needed for wheezing or shortness of breath. 06/11/12  Yes Belkys A Regalado, MD  amLODipine (NORVASC) 10 MG tablet Take 10 mg by mouth daily.   Yes Historical Provider, MD  Artificial Saliva (BIOTENE MOISTURIZING MOUTH) SOLN Use as directed 1 spray in the mouth or throat daily.   Yes Historical Provider, MD  Ascorbic Acid (VITAMIN C PO) Take 1 tablet by mouth daily.   Yes Historical Provider, MD  aspirin 81 MG chewable tablet Chew 1 tablet (81 mg total) by mouth daily. 02/19/12  Yes Simonne Martinet, NP  atorvastatin (LIPITOR) 40 MG tablet Take 1 tablet (40 mg total) by mouth daily. 07/16/12  Yes Renae Fickle, MD  busPIRone (BUSPAR) 5 MG tablet Take 5 mg by mouth 2 (two) times daily.   Yes Historical Provider, MD  carvedilol (COREG) 6.25 MG tablet Take 1 tablet (6.25 mg total) by mouth 2 (two) times daily with a meal. 05/19/12  Yes Estela Isaiah Blakes, MD  cloNIDine (CATAPRES) 0.2 MG tablet Take 0.5 tablets (0.1 mg total) by mouth 2 (two) times daily. 07/16/12  Yes Renae Fickle, MD  clopidogrel (PLAVIX)  75 MG tablet Take 1 tablet (75 mg total) by mouth daily with breakfast. 07/16/12  Yes Renae Fickle, MD  dextromethorphan-guaiFENesin [facility] DM) 30-600 MG per 12 hr tablet Take 1 tablet by mouth 2 (two) times daily. 07/16/12  Yes Renae Fickle, MD  ferrous sulfate 325 (65 FE) MG EC tablet Take 1 tablet (325 mg total) by mouth 2 (two) times daily. 07/16/12  Yes Renae Fickle, MD  hydrALAZINE (APRESOLINE) 100 MG tablet Take 100 mg by mouth 3 (three) times daily.   Yes Historical Provider, MD  ipratropium (ATROVENT) 0.02 % nebulizer solution Take 2.5 mLs (0.5 mg total) by nebulization 3 (three) times daily. 07/16/12  Yes Renae Fickle, MD  isosorbide mononitrate (IMDUR) 30 MG 24 hr tablet Take 1 tablet (30 mg total) by mouth daily. 05/19/12   Yes Estela Isaiah Blakes, MD  metoCLOPramide (REGLAN) 5 MG tablet Take 5 mg by mouth 4 (four) times daily.   Yes Historical Provider, MD  oxycodone (OXY-IR) 5 MG capsule Take 5 mg by mouth every 6 (six) hours as needed (for moderate pain).   Yes Historical Provider, MD  pantoprazole (PROTONIX) 20 MG tablet Take 40 mg by mouth daily.   Yes Historical Provider, MD  sertraline (ZOLOFT) 100 MG tablet Take 100 mg by mouth at bedtime.   Yes Historical Provider, MD  torsemide (DEMADEX) 20 MG tablet Take 20 mg by mouth daily.   Yes Historical Provider, MD  arformoterol (BROVANA) 15 MCG/2ML NEBU Take 2 mLs (15 mcg total) by nebulization 2 (two) times daily. 07/16/12   Renae Fickle, MD  budesonide (PULMICORT) 0.25 MG/2ML nebulizer solution Take 2 mLs (0.25 mg total) by nebulization 2 (two) times daily. 07/16/12   Renae Fickle, MD  carvedilol (COREG) 3.125 MG tablet Take 3.125 mg by mouth 2 (two) times daily with a meal.    Historical Provider, MD    Review of Systems:  Constitutional:  No weight loss, night sweats, Fevers. Head&Eyes: No headache.  No vision loss.  No eye pain or scotoma ENT:  No Difficulty swallowing,Tooth/dental problems,Sore throat,   Cardio-vascular:  No chest pain,  dizziness, palpitations  GI:  No  abdominal pain, vomiting, diarrhea, loss of appetite, hematochezia, melena, heartburn, indigestion, Resp:  No coughing up of blood .No wheezing.No chest wall deformity  Skin:  no rash or lesions.  GU:  no dysuria, change in color of urine, no urgency or frequency. No flank pain.  Musculoskeletal:  No joint pain or swelling. No decreased range of motion. No back pain.  Psych:  No change in mood or affect. Neurologic: No headache, no dysesthesia, no focal weakness, no vision loss. No syncope  Physical Exam: Filed Vitals:   08/20/12 0730 08/20/12 0745 08/20/12 0800 08/20/12 0815  BP: 162/64 169/54 153/50   Pulse: 74  73 70  Temp:      TempSrc:      Resp: 23  24  18   SpO2: 98%  98% 100%   General:  A&O x 3, NAD, nontoxic, pleasant/cooperative Head/Eye: No conjunctival hemorrhage, no icterus, [place]/AT, No nystagmus ENT:  No icterus,  No thrush, edentulous, no oropharyngeal exudate; tracheostomy intact without any blood Neck:  No masses, no lymphadenpathy CV:  RRR, no rub, no gallop, no S3 Lung:  Scattered bilateral expiratory wheeze Abdomen: soft/NT, +BS, nondistended, no peritoneal signs Ext: No cyanosis, No rashes, No petechiae, No lymphangitis, 2+ edema   Labs on Admission:  Basic Metabolic Panel:  Recent Labs Lab 08/20/12 0411  NA 142  K 3.7  CL 109  CO2 26  GLUCOSE 99  BUN 40*  CREATININE 2.82*  CALCIUM 8.9   Liver Function Tests: No results found for this basename: AST, ALT, ALKPHOS, BILITOT, PROT, ALBUMIN,  in the last 168 hours No results found for this basename: LIPASE, AMYLASE,  in the last 168 hours No results found for this basename: AMMONIA,  in the last 168 hours CBC:  Recent Labs Lab 08/20/12 0411  WBC 9.2  NEUTROABS 6.3  HGB 9.1*  HCT 29.9*  MCV 84.9  PLT 184   Cardiac Enzymes: No results found for this basename: CKTOTAL, CKMB, CKMBINDEX, TROPONINI,  in the last 168 hours BNP: No components found with this basename: POCBNP,  CBG: No results found for this basename: GLUCAP,  in the last 168 hours  Radiological Exams on Admission: Dg Chest 2 View  08/20/2012   *RADIOLOGY REPORT*  Clinical Data: Shortness of breath.  Hypertension.  Cardiomyopathy.  CHEST - 2 VIEW  Comparison: 07/29/2012  Findings: Tracheostomy appropriately positioned.  Moderate cardiomegaly with atherosclerosis in the transverse aorta.  No definite pleural fluid. No pneumothorax.  No congestive failure.  Mild right base volume loss and subsegmental atelectasis.  Slight improvement in left base aeration. Probable subsegmental atelectasis remaining.  Moderate left hemidiaphragm elevation.  IMPRESSION: Cardiomegaly and moderate left hemidiaphragm  elevation.  Bibasilar atelectasis without convincing evidence of acute superimposed process.   Original Report Authenticated By: Jeronimo Greaves, M.D.    EKG: Independently reviewed. Sinus, RBBB    Time spent:70 minutes Code Status:   FULL Family Communication:   Family at bedside   Trayce Caravello, DO  [facility] Pager [phone]  If 7PM-7AM, please contact night-coverage [email or web address] Password [facility] 08/20/2012, 8:31 AM
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
## 2012-08-20 NOTE — ED Notes (Signed)
Patient transferred to room 4713 via stretcher report called to Tia RN NAD noted at the time of admission. Vital signs stable

## 2012-08-20 NOTE — Progress Notes (Signed)
08/20/12 1207  Therapy Vitals  Pulse Rate 92  Resp ! 22  Patient Position, if appropriate Lying  Respiratory Assessment  Assessment Type Assess only  Respiratory Pattern Regular  Chest Assessment Chest expansion symmetrical  Cough Strong;Non-productive  Bilateral Breath Sounds Diminished  R Upper  Breath Sounds Diminished  L Upper Breath Sounds Diminished  R Lower Breath Sounds Diminished  L Lower Breath Sounds Diminished  Oxygen Therapy/Pulse Ox  O2 Device Trach collar  O2 Therapy Oxygen humidified  O2 Flow Rate (L/min) 10 L/min  FiO2 (%) 40 %  SpO2 94 %  Tracheostomy Shiley 4 mm Cuffed  No Placement Date or Time found.   LDA Placed Prior to Arrival?: Yes  Brand: Shiley  Size (mm): 4 mm  Style: Cuffed  Status Secured  Ties Assessment Clean;Dry  Emergency Equipment at bedside Yes  RN requested RT to beside because pt stated she could not breath.  RT gave pt a neb treatment and tried to suction.  Suction catheter would not pass due to trach being occluded.  RT called lead therapist and pt was bagged and suctioned again with saline.  RT was able to pass catheter and suctioned a moderate about of thick creamy colored secretions.  Pts SPO2 levels increased to 94% and pt is currently resting comfortably.  RT will continue to monitor.

## 2012-08-20 NOTE — ED Notes (Signed)
Phlebotomy at bedside.

## 2012-08-20 NOTE — Progress Notes (Signed)
Patient came from ED to the floor between 0930-1000 am with shorthness of breath, patient has trach, BP 179/71 SR with bundle branch, O2 sat 90-96 on trach collar. Few minutes later pt. Became restless and O2 sat drop to 85-88%, respiratory therapy call, respiratory therapy suction pt. , thick creamy secretion remove, breathing treatment given, O2 sat up to 94-97%. See respiratory therapy note. Will continue to monitor.

## 2012-08-20 NOTE — ED Provider Notes (Signed)
History    CSN: [id number] Arrival date & time 08/20/12  7829  First MD Initiated Contact with Patient 08/20/12 [phone]     Chief Complaint  Patient presents with  . Shortness of Breath   (Consider location/radiation/quality/duration/timing/severity/associated sxs/prior Treatment) Patient is a 72 y.o. female presenting with shortness of breath. The history is provided by the patient.  Shortness of Breath She is complaining of shortness of breath which started tonight. She states she was fine throughout the day. There is no associated chest pain, heaviness, tightness, or pressure. There is a cough which is nonproductive. She does have some leg swelling which is not unusual for her. Dyspnea is worse when she lays flat. Nothing makes it feel any better. Past Medical History  Diagnosis Date  . COPD (chronic obstructive pulmonary disease)     on home o2  . Chronic renal insufficiency   . CVA (cerebral infarction)     hx  . Hypertensive heart disease with congestive heart failure   . Chronic cor pulmonale   . Cardiomyopathy of undetermined type 09/07/2011  . Chronic kidney disease (CKD), stage IV (severe)   . Hyperlipdemia   . Obesity (BMI 30-39.9)   . Sleep apnea   . History of gout 09/07/2011  . Heart failure 7/13  . Shortness of breath   . Anemia   . Vascular disease   . Asthma   . Anginal pain   . Hypertension   . Type II or unspecified type diabetes mellitus without mention of complication, not stated as uncontrolled   . Stroke   . GERD (gastroesophageal reflux disease)   . Arthritis   . Bundle branch block   . Thrombocytopenia   . Acute and chronic respiratory failure    Past Surgical History  Procedure Laterality Date  . Bilateral oophorectomy    . Cardiac catheterization      right heart cath  . Laparoscopic gastrostomy  12/21/2011    Procedure: LAPAROSCOPIC GASTROSTOMY;  Surgeon: Axel Filler, MD;  Location: [facility] OR;  Service: General;  Laterality: N/A;   laparoscopic gastrostomy possible open feeding tube  . Abdominal hysterectomy    . Tracheostomy  11/2011  . Cataracts    . Tracheostomy tube placement  02/18/2012    Procedure: TRACHEOSTOMY;  Surgeon: Serena Colonel, MD;  Location: [facility] OR;  Service: ENT;  Laterality: N/A;   Family History  Problem Relation Age of Onset  . Heart attack Father   . Stroke Mother     CVA  . Coronary artery disease Daughter    History  Substance Use Topics  . Smoking status: Former Smoker -- 1.00 packs/day for 55 years    Types: Cigarettes    Quit date: 08/28/2011  . Smokeless tobacco: Never Used  . Alcohol Use: No   OB History   Grav Para Term Preterm Abortions TAB SAB Ect Mult Living                 Review of Systems  Respiratory: Positive for shortness of breath.   All other systems reviewed and are negative.    Allergies  Other and Sulfonamide derivatives  Home Medications   Current Outpatient Rx  Name  Route  Sig  Dispense  Refill  . albuterol (PROVENTIL) (5 MG/ML) 0.5% nebulizer solution   Nebulization   Take 2.5 mg by nebulization every 4 (four) hours as needed for wheezing or shortness of breath.         Marland Kitchen amLODipine (NORVASC) 10 MG  tablet   Oral   Take 10 mg by mouth daily.         Marland Kitchen arformoterol (BROVANA) 15 MCG/2ML NEBU   Nebulization   Take 2 mLs (15 mcg total) by nebulization 2 (two) times daily.   120 mL   1   . Artificial Saliva (BIOTENE MOISTURIZING MOUTH) SOLN   Mouth/Throat   Use as directed 1 spray in the mouth or throat daily.         . Ascorbic Acid (VITAMIN C PO)   Oral   Take 1 tablet by mouth daily.         Marland Kitchen aspirin 81 MG chewable tablet   Oral   Chew 1 tablet (81 mg total) by mouth daily.         Marland Kitchen atorvastatin (LIPITOR) 40 MG tablet   Oral   Take 1 tablet (40 mg total) by mouth daily.   30 tablet   1   . budesonide (PULMICORT) 0.25 MG/2ML nebulizer solution   Nebulization   Take 2 mLs (0.25 mg total) by nebulization 2 (two) times  daily.   60 mL   12   . busPIRone (BUSPAR) 5 MG tablet   Oral   Take 5 mg by mouth 2 (two) times daily.         . carvedilol (COREG) 3.125 MG tablet   Oral   Take 3.125 mg by mouth 2 (two) times daily with a meal.         . carvedilol (COREG) 6.25 MG tablet   Oral   Take 1 tablet (6.25 mg total) by mouth 2 (two) times daily with a meal.   60 tablet   1   . cloNIDine (CATAPRES) 0.2 MG tablet   Oral   Take 0.5 tablets (0.1 mg total) by mouth 2 (two) times daily.   60 tablet   11   . clopidogrel (PLAVIX) 75 MG tablet   Oral   Take 1 tablet (75 mg total) by mouth daily with breakfast.   30 tablet   11   . dextromethorphan-guaiFENesin (MUCINEX DM) 30-600 MG per 12 hr tablet   Oral   Take 1 tablet by mouth 2 (two) times daily.   60 tablet   0   . ferrous sulfate 325 (65 FE) MG EC tablet   Oral   Take 1 tablet (325 mg total) by mouth 2 (two) times daily.   60 tablet   3   . hydrALAZINE (APRESOLINE) 100 MG tablet   Oral   Take 100 mg by mouth 3 (three) times daily.         Marland Kitchen ipratropium (ATROVENT) 0.02 % nebulizer solution   Nebulization   Take 2.5 mLs (0.5 mg total) by nebulization 3 (three) times daily.   75 mL   12   . isosorbide mononitrate (IMDUR) 30 MG 24 hr tablet   Oral   Take 1 tablet (30 mg total) by mouth daily.   30 tablet   1   . metoCLOPramide (REGLAN) 5 MG tablet   Oral   Take 5 mg by mouth 4 (four) times daily.         Marland Kitchen oxycodone (OXY-IR) 5 MG capsule   Oral   Take 5 mg by mouth every 6 (six) hours as needed (for moderate pain).         . pantoprazole (PROTONIX) 40 MG tablet   Oral   Take 1 tablet (40 mg total) by mouth every evening.  30 tablet   0   . sertraline (ZOLOFT) 100 MG tablet   Oral   Take 100 mg by mouth at bedtime.         . torsemide (DEMADEX) 20 MG tablet   Oral   Take 20 mg by mouth daily.          BP 146/74  Pulse 69  Temp(Src) 98.4 F (36.9 C) (Oral)  Resp 20  SpO2 96% Physical Exam   Nursing note and vitals reviewed.  72 year old female, resting comfortably and in no acute distress. Vital signs are significant for hypertension with blood pressure 146/74. Oxygen saturation is 96%, which is normal. Head is normocephalic and atraumatic. PERRLA, EOMI. Oropharynx is clear. Neck is nontender and supple without adenopathy or JVD.tracheostomy is in place. Back is nontender and there is no CVA tenderness. Lungs have fine rales at the left base, there are no wheezes or rhonchi. Chest is nontender. Heart has regular rate and rhythm without murmur. Abdomen is soft, flat, nontender without masses or hepatosplenomegaly and peristalsis is normoactive. Extremities have 1+ edema, full range of motion is present. Skin is warm and dry without rash. Neurologic: Mental status is normal, cranial nerves are intact, there are no motor or sensory deficits.  ED Course  Procedures (including critical care time) Results for orders placed during the hospital encounter of 08/20/12  CBC WITH DIFFERENTIAL      Result Value Range   WBC 9.2  4.0 - 10.5 K/uL   RBC 3.52 (*) 3.87 - 5.11 MIL/uL   Hemoglobin 9.1 (*) 12.0 - 15.0 g/dL   HCT 16.1 (*) 09.6 - 04.5 %   MCV 84.9  78.0 - 100.0 fL   MCH 25.9 (*) 26.0 - 34.0 pg   MCHC 30.4  30.0 - 36.0 g/dL   RDW 40.9 (*) 81.1 - 91.4 %   Platelets 184  150 - 400 K/uL   Neutrophils Relative % 69  43 - 77 %   Neutro Abs 6.3  1.7 - 7.7 K/uL   Lymphocytes Relative 19  12 - 46 %   Lymphs Abs 1.8  0.7 - 4.0 K/uL   Monocytes Relative 11  3 - 12 %   Monocytes Absolute 1.0  0.1 - 1.0 K/uL   Eosinophils Relative 1  0 - 5 %   Eosinophils Absolute 0.1  0.0 - 0.7 K/uL   Basophils Relative 0  0 - 1 %   Basophils Absolute 0.0  0.0 - 0.1 K/uL  BASIC METABOLIC PANEL      Result Value Range   Sodium 142  135 - 145 mEq/L   Potassium 3.7  3.5 - 5.1 mEq/L   Chloride 109  96 - 112 mEq/L   CO2 26  19 - 32 mEq/L   Glucose, Bld 99  70 - 99 mg/dL   BUN 40 (*) 6 - 23 mg/dL    Creatinine, Ser 7.82 (*) 0.50 - 1.10 mg/dL   Calcium 8.9  8.4 - 95.6 mg/dL   GFR calc non Af Amer 16 (*) >90 mL/min   GFR calc Af Amer 18 (*) >90 mL/min  PRO B NATRIURETIC PEPTIDE      Result Value Range   Pro B Natriuretic peptide (BNP) 10275.0 (*) 0 - 125 pg/mL   Dg Chest 2 View  08/20/2012   *RADIOLOGY REPORT*  Clinical Data: Shortness of breath.  Hypertension.  Cardiomyopathy.  CHEST - 2 VIEW  Comparison: 07/29/2012  Findings: Tracheostomy appropriately positioned.  Moderate cardiomegaly with atherosclerosis in the transverse aorta.  No definite pleural fluid. No pneumothorax.  No congestive failure.  Mild right base volume loss and subsegmental atelectasis.  Slight improvement in left base aeration. Probable subsegmental atelectasis remaining.  Moderate left hemidiaphragm elevation.  IMPRESSION: Cardiomegaly and moderate left hemidiaphragm elevation.  Bibasilar atelectasis without convincing evidence of acute superimposed process.   Original Report Authenticated By: Jeronimo Greaves, M.D.    Images viewed by me.   Date: 08/20/2012  Rate: 67  Rhythm: normal sinus rhythm  QRS Axis: left  Intervals: normal  ST/T Wave abnormalities: nonspecific ST/T changes  Conduction Disutrbances:right bundle branch block and left anterior fascicular block  Narrative Interpretation: left ventricular hypertrophy, right bundle branch block, left anterior fascicular block, repolarization changes secondary to left ventricular hypertrophy. When compared with ECG of Jul 08 2012, no significant changes are seen.  Old EKG Reviewed: unchanged    1. Dyspnea   2. COPD (chronic obstructive pulmonary disease)   3. CHF (congestive heart failure), acute on chronic, systolic   4. Renal insufficiency   5. Tracheostomy status   6. Anemia     MDM  Acute dyspnea. She needs to be evaluated for possible pneumonia, possible congestive heart flair. Chest x-ray or be obtained as well as beta natruretic protein level.  Currently she is in no distress and with good oxygen saturations and no evidence of bronchospasm.old records are reviewed and she had been admitted to the hospital last month for acute on chronic respiratory failure. However, that was because of pneumonia.  Workup shows improvement in anemia, slight worsening of chronic renal insufficiency, and a slight increase in BNP indicating perhaps some degree of worsening CHF. She's given a dose of furosemide intravenously but has not had any subjective improvement. In spite of this, she continues to maintain oxygen saturations of 100%. She does have some coarse breath sounds for tracheostomy but no evidence of failure the tracheostomy and she's not in any respiratory distress. However, given her subjective dyspnea and history of her fragile respiratory status, she will be admitted under observation status. Case discussed with Dr. Rennis Petty [facility].   Dione Booze, MD 08/20/12 [phone]

## 2012-08-21 DIAGNOSIS — N184 Chronic kidney disease, stage 4 (severe): Secondary | ICD-10-CM

## 2012-08-21 LAB — BASIC METABOLIC PANEL
BUN: 47 mg/dL — ABNORMAL HIGH (ref 6–23)
CO2: 26 mEq/L (ref 19–32)
Chloride: 106 mEq/L (ref 96–112)
Creatinine, Ser: 2.74 mg/dL — ABNORMAL HIGH (ref 0.50–1.10)
Glucose, Bld: 94 mg/dL (ref 70–99)
Potassium: 4.1 mEq/L (ref 3.5–5.1)

## 2012-08-21 MED ORDER — PREDNISONE 50 MG PO TABS
50.0000 mg | ORAL_TABLET | Freq: Every day | ORAL | Status: DC
  Administered 2012-08-22 – 2012-08-24 (×3): 50 mg via ORAL
  Filled 2012-08-21 (×4): qty 1

## 2012-08-21 MED ORDER — FUROSEMIDE 10 MG/ML IJ SOLN: 60.0000 mg | Freq: Once | INTRAMUSCULAR | Status: DC

## 2012-08-21 MED ORDER — FUROSEMIDE 10 MG/ML IJ SOLN
80.0000 mg | Freq: Once | INTRAMUSCULAR | Status: AC
  Administered 2012-08-21: 80 mg via INTRAVENOUS
  Filled 2012-08-21: qty 8

## 2012-08-21 NOTE — Progress Notes (Signed)
UR Chart Review Completed  

## 2012-08-21 NOTE — Progress Notes (Signed)
PT Cancellation Note  Patient Details Name: [name] MRN: [id number] DOB: 1940/07/29   Cancelled Treatment:    Reason Eval/Treat Not Completed: Patient declined, no reason specified. Pt adamently refused due to "I don't feel good." When asked pt couldn't report why she didn't feel good. Extensive education provided on importance of OOB mobility and role of physical therapy. PT to reattempt as able.   [name] 08/21/2012, 12:58 PM

## 2012-08-21 NOTE — Progress Notes (Signed)
[facility] PROGRESS NOTE  [name]:[id number] DOB: 05-31-1940 DOA: 08/20/2012 PCP: Gwynneth Aliment, MD  Assessment/Plan: Acute on chronic respiratory failure  -This is likely multifactorial including acute on chronic combined CHF, cor pulmonale, COPD, mucus plugging, OSA, and deconditioning  -The patient currently does not have any respiratory distress  -Will address the above issues as discussed below  Acute on chronic combined CHF  -Clinically, the patient appears fluid overloaded  -Although her chest x-ray does not suggest significant pulmonary edema, I suspect that she mostly has right-sided CHF, cor pulmonale  -She has significant peripheral edema and JVD  -She has received one dose of intravenous furosemide in the emergency department patient;  received an additional 40 mg IV evening of 08/20/2012  -Give additional 80 mg furosemide IV x1 (08/21/2012) -Echocardiogram on 07/13/2012 shows EF 45%, grade 2 diastolic dysfunction  COPD/cor pulmonale  -Does not appear to be decompensated although she does have some scattered wheezing  -I will give her one dose of intravenous Solu-Medrol  -Change the patient to oral prednisone and weaned as tolerated  -Aerosolized albuterol and Atrovent as well as Brovana  -Aggressive pulmonary toilet  -Physical therapy--patient refused today  Acute on chronic renal failure, CKD stage IV  -Will diurese judiciously  -Monitor renal function  History of NSTEMI (07/08/2012 admission)  -Cycle troponins  -Continue aspirin, Plavix, statin  Hypertension  -Continue amlodipine, carvedilol, hydralazine, clonidine  Anemia  -Multifactorial including anemia of chronic disease and iron deficiency  -Continue iron  GERD  -Continue PPI   Family Communication:   Pt at beside Disposition Plan:   Home when medically stable         Procedures/Studies: Dg Chest 2 View  08/20/2012   *RADIOLOGY REPORT*  Clinical Data: Shortness of breath.   Hypertension.  Cardiomyopathy.  CHEST - 2 VIEW  Comparison: 07/29/2012  Findings: Tracheostomy appropriately positioned.  Moderate cardiomegaly with atherosclerosis in the transverse aorta.  No definite pleural fluid. No pneumothorax.  No congestive failure.  Mild right base volume loss and subsegmental atelectasis.  Slight improvement in left base aeration. Probable subsegmental atelectasis remaining.  Moderate left hemidiaphragm elevation.  IMPRESSION: Cardiomegaly and moderate left hemidiaphragm elevation.  Bibasilar atelectasis without convincing evidence of acute superimposed process.   Original Report Authenticated By: Jeronimo Greaves, M.D.   Dg Chest Port 1 View  07/29/2012   *RADIOLOGY REPORT*  Clinical Data: 72 year old female tracheostomy replacement.  PORTABLE CHEST - 1 VIEW  Comparison: 07/15/2012 and earlier.  Findings: AP portable semi upright view 1820 hours.  Tracheostomy tube projects in the midline and appears adequately positioned. Stable lung volumes. Stable cardiomegaly and mediastinal contours. Interval mildly decreased retrocardiac opacity with loss of definition of the left hemidiaphragm.  No pneumothorax or pulmonary edema.  No definite effusion.  IMPRESSION: 1.  Tracheostomy tube in place with no adverse features. 2.  Chronic cardiomegaly and mildly decreased retrocardiac ventilation.   Original Report Authenticated By: Erskine Speed, M.D.         Subjective:  patient is breathing better but continues to complain of some dyspnea on exertion. Oxygen saturation is 100% on trach collar oxygenation. Denies any nausea, vomiting, diarrhea, abdominal pain, dizziness, chest pain.  Objective: Filed Vitals:   08/21/12 0831 08/21/12 1049 08/21/12 1220 08/21/12 1353  BP:  124/60    Pulse: 81 76 77   Temp:  98.5 F (36.9 C)    TempSrc:  Oral    Resp: 16  16   Height:  Weight:      SpO2: 96% 98% 97% 99%    Intake/Output Summary (Last 24 hours) at 08/21/12 1359 Last data filed at  08/21/12 1218  Gross per 24 hour  Intake    863 ml  Output    900 ml  Net    -37 ml   Weight change:  Exam:   General:  Pt is alert, follows commands appropriately, not in acute distress  HEENT: No icterus, No thrush,  [place]/AT;  trach collar intact with tracheostomy intact.  Cardiovascular: RRR, S1/S2, no rubs, no gallops  Respiratory: bibasilar crackles, left greater than right. No wheezes. Good air movement.   Abdomen: Soft/+BS, non tender, non distended, no guarding  Extremities: 1+ edema, No lymphangitis, No petechiae, No rashes, no synovitis  Data Reviewed: Basic Metabolic Panel:  Recent Labs Lab 08/20/12 0411 08/21/12 0605  NA 142 139  K 3.7 4.1  CL 109 106  CO2 26 26  GLUCOSE 99 94  BUN 40* 47*  CREATININE 2.82* 2.74*  CALCIUM 8.9 8.8   Liver Function Tests: No results found for this basename: AST, ALT, ALKPHOS, BILITOT, PROT, ALBUMIN,  in the last 168 hours No results found for this basename: LIPASE, AMYLASE,  in the last 168 hours No results found for this basename: AMMONIA,  in the last 168 hours CBC:  Recent Labs Lab 08/20/12 0411  WBC 9.2  NEUTROABS 6.3  HGB 9.1*  HCT 29.9*  MCV 84.9  PLT 184   Cardiac Enzymes:  Recent Labs Lab 08/20/12 1107 08/20/12 1658 08/20/12 2212  TROPONINI <0.30 <0.30 <0.30   BNP: No components found with this basename: POCBNP,  CBG:  Recent Labs Lab 08/20/12 1111 08/20/12 1647  GLUCAP 100* 135*    No results found for this or any previous visit (from the past 240 hour(s)).   Scheduled Meds: . ipratropium  0.5 mg Nebulization Q6H   And  . albuterol  2.5 mg Nebulization Q6H  . amLODipine  10 mg Oral Daily  . antiseptic oral rinse  15 mL Mouth Rinse Daily  . arformoterol  15 mcg Nebulization BID  . aspirin  81 mg Oral Daily  . atorvastatin  40 mg Oral Daily  . budesonide  0.25 mg Nebulization BID  . busPIRone  5 mg Oral BID  . carvedilol  6.25 mg Oral BID WC  . cloNIDine  0.1 mg Oral BID  .  clopidogrel  75 mg Oral Q breakfast  . dextromethorphan-guaiFENesin  1 tablet Oral BID  . enoxaparin (LOVENOX) injection  30 mg Subcutaneous Q24H  . ferrous sulfate  325 mg Oral BID  . furosemide  60 mg Intravenous Once  . hydrALAZINE  100 mg Oral TID  . isosorbide mononitrate  30 mg Oral Daily  . metoCLOPramide  5 mg Oral QID  . pantoprazole  40 mg Oral Daily  . [START ON 08/22/2012] predniSONE  50 mg Oral Q breakfast  . sertraline  100 mg Oral QHS  . sodium chloride  3 mL Intravenous Q12H  . vitamin C  500 mg Oral Daily   Continuous Infusions:    Maxi Carreras, DO  [facility] Pager [phone]  If 7PM-7AM, please contact night-coverage [email or web address] Password [email or web address] 08/21/2012, 1:59 PM   LOS: 1 day

## 2012-08-21 NOTE — Progress Notes (Addendum)
Wrong entry (wrong pt).Marland Kitchen

## 2012-08-21 NOTE — Care Management Note (Addendum)
    Page 1 of 2   09/08/2012     3:31:16 PM   CARE MANAGEMENT NOTE 09/08/2012  Patient:  [name], [name]   Account Number:  [id number]  Date Initiated:  08/21/2012  Documentation initiated by:  [facility]  Subjective/Objective Assessment:   72 y.o. female w/ h/o cardio pulmonary arrest in 01/2012 , s/p trach, s/p recent admission for acute on chronic respiratory failure/ HOME WITH DAUGHTER     Action/Plan:   IV STEROIDS; IV LASIX//HOME WITH HOME HEALTH   Anticipated DC Date:  09/09/2012   Anticipated DC Plan:  HOME W HOME HEALTH SERVICES         [facility] Choice  Resumption Of Svcs/PTA Provider   Choice offered to / List presented to:  NA        HH arranged  HH-1 RN  HH-2 PT  HH-4 NURSE'S AIDE      HH agency  [facility]   Status of service:  Completed, signed off Medicare Important Message given?   (If response is "NO", the following Medicare IM given date fields will be blank) Date Medicare IM given:   Date Additional Medicare IM given:    Discharge Disposition:    Per UR Regulation:    If discussed at Long Length of Stay Meetings, dates discussed:   08/29/2012  08/31/2012    Comments:  09-08-12 Plan for discharge to home 09-09-12 . [facility] aware . Patient will have visit tomorrow . Ronny Flurry RN BSN    08/21/12 1100.Marland KitchenMarland KitchenOletta Cohn, RN, BSN, [place] [phone] Per progression rounds this AM: RN reports pt having HH services in place.  NCM confirmed with Hilda Lias of [facility]. Resumption of care orders requested for pt upon discharge.

## 2012-08-22 LAB — BASIC METABOLIC PANEL
BUN: 57 mg/dL — ABNORMAL HIGH (ref 6–23)
CO2: 25 mEq/L (ref 19–32)
Calcium: 8.8 mg/dL (ref 8.4–10.5)
Chloride: 103 mEq/L (ref 96–112)
Creatinine, Ser: 2.7 mg/dL — ABNORMAL HIGH (ref 0.50–1.10)
Glucose, Bld: 110 mg/dL — ABNORMAL HIGH (ref 70–99)

## 2012-08-22 MED ORDER — FUROSEMIDE 10 MG/ML IJ SOLN
80.0000 mg | Freq: Two times a day (BID) | INTRAMUSCULAR | Status: DC
  Administered 2012-08-22 – 2012-08-23 (×2): 80 mg via INTRAVENOUS
  Filled 2012-08-22 (×2): qty 8

## 2012-08-22 NOTE — Evaluation (Signed)
Physical Therapy Evaluation Patient Details Name: [name] MRN: [id number] DOB: 06-04-1940 Today's Date: 08/22/2012 Time: 1050-1109 PT Time Calculation (min): 19 min  PT Assessment / Plan / Recommendation History of Present Illness  Pt admitted with SOB. Pt with trach.  Clinical Impression  Pt requires max encouragement to participate in PT. Pt appears to be functioning near baseline. Acute PT to follow to advance ambulation tolerance. Pt to benefit from con't HHPT upon d/c from hospital. Pt with good home set up and support and safe to d/c home from mobility stand point. Pt using w/c as primary mode of mobility at this time.    PT Assessment  Patient needs continued PT services    Follow Up Recommendations  Home health PT;Supervision/Assistance - 24 hour    Does the patient have the potential to tolerate intense rehabilitation      Barriers to Discharge        Equipment Recommendations  None recommended by PT    Recommendations for Other Services     Frequency Min 3X/week    Precautions / Restrictions Precautions Precautions: Fall (trach) Restrictions Weight Bearing Restrictions: No   Pertinent Vitals/Pain Pt denies pain       Mobility  Bed Mobility Bed Mobility: Supine to Sit Supine to Sit: 5: Supervision;With rails;HOB elevated Transfers Transfers: Sit to Stand;Stand to Sit;Stand Pivot Transfers Sit to Stand: 4: Min guard;With upper extremity assist;From bed Stand to Sit: 5: Supervision;With upper extremity assist;To chair/3-in-1 Stand Pivot Transfers: 4: Min guard Details for Transfer Assistance: pt with safe technique Ambulation/Gait Ambulation/Gait Assistance: Not tested (comment) (pt refused to ambulate)    Exercises     PT Diagnosis: Difficulty walking;Generalized weakness  PT Problem List: Decreased strength;Decreased activity tolerance;Decreased balance;Decreased mobility PT Treatment Interventions: DME instruction;Gait training;Stair  training;Functional mobility training;Therapeutic activities;Therapeutic exercise     PT Goals(Current goals can be found in the care plan section) Acute Rehab PT Goals Patient Stated Goal: home PT Goal Formulation: With patient Time For Goal Achievement: 09/05/12 Potential to Achieve Goals: Good  Visit Information  Last PT Received On: 08/22/12 Assistance Needed: +1 History of Present Illness: Pt admitted with SOB. Pt with trach.       Prior Functioning  Home Living Family/patient expects to be discharged to:: Private residence Living Arrangements: Children Available Help at Discharge: Family;Available PRN/intermittently Type of Home: House Home Access: Level entry Home Layout: One level Home Equipment: Walker - 2 wheels;Wheelchair - manual;Grab bars - toilet;Grab bars - tub/shower Prior Function Level of Independence: Needs assistance Gait / Transfers Assistance Needed: pt primariliy uses w/c. short distance amb with RW with home PT ADL's / Homemaking Assistance Needed: assist with cooking and bathing. pt dresses self indep. Communication Communication: No difficulties;Passy-Muir valve;Tracheostomy Dominant Hand: Right    Cognition  Cognition Arousal/Alertness: Awake/alert Behavior During Therapy: WFL for tasks assessed/performed Overall Cognitive Status: Within Functional Limits for tasks assessed    Extremity/Trunk Assessment Upper Extremity Assessment Upper Extremity Assessment: Overall WFL for tasks assessed Lower Extremity Assessment Lower Extremity Assessment: Overall WFL for tasks assessed Cervical / Trunk Assessment Cervical / Trunk Assessment: Normal   Balance    End of Session PT - End of Session Equipment Utilized During Treatment: Gait belt;Oxygen Activity Tolerance: Patient limited by fatigue Patient left: in chair;with call bell/phone within reach Nurse Communication: Mobility status  GP     [name] 08/22/2012, 1:38 PM  Lewis Shock, PT, DPT Pager #: [phone] Office #: [phone]

## 2012-08-22 NOTE — Progress Notes (Addendum)
[facility] PROGRESS NOTE  [name] YQM:[id number] DOB: 01/10/1941 DOA: 08/20/2012 PCP: Gwynneth Aliment, MD  Assessment/Plan: Acute on chronic respiratory failure  -This is likely multifactorial including acute on chronic combined CHF, cor pulmonale, COPD, mucus plugging, OSA, and deconditioning  -The patient currently does not have any respiratory distress  -Will address the above issues as discussed below  [facility] status has improved since admission; currently stable on trach collar 28% FiO2 Acute on chronic combined CHF  -Clinically, the patient remains fluid overloaded  -initial proBNP 10275 -Although her chest x-ray does not suggest significant pulmonary edema, I suspect that she mostly has right-sided CHF, cor pulmonale  -She has significant peripheral edema and JVD  -She has received one dose of intravenous furosemide in the emergency department patient; received an additional 40 mg IV evening of 08/20/2012  -Increase furosemide 80mg  IV bid -Echocardiogram on 07/13/2012 shows EF 45%, grade 2 diastolic dysfunction  COPD/cor pulmonale  -Does not appear to be decompensated although she does have some scattered wheezing  -I will give her one dose of intravenous Solu-Medrol  -Change the patient to oral prednisone and weaned as tolerated  -Aerosolized albuterol and Atrovent as well as Brovana  -Aggressive pulmonary toilet  -Physical therapy Acute on chronic renal failure, CKD stage IV  -Monitor renal function--remains stable -Baseline creatinine 2.5-2.7 History of NSTEMI (07/08/2012 admission)  -Cycle troponins--neg -Continue aspirin, Plavix, statin  Hypertension  -Continue amlodipine, carvedilol, hydralazine, clonidine  Anemia  -Multifactorial including anemia of chronic disease and iron deficiency  -Continue iron  GERD  -Continue PPI  Family Communication: Pt at beside  Disposition Plan: Home when medically stable        Procedures/Studies: Dg  Chest 2 View  08/20/2012   *RADIOLOGY REPORT*  Clinical Data: Shortness of breath.  Hypertension.  Cardiomyopathy.  CHEST - 2 VIEW  Comparison: 07/29/2012  Findings: Tracheostomy appropriately positioned.  Moderate cardiomegaly with atherosclerosis in the transverse aorta.  No definite pleural fluid. No pneumothorax.  No congestive failure.  Mild right base volume loss and subsegmental atelectasis.  Slight improvement in left base aeration. Probable subsegmental atelectasis remaining.  Moderate left hemidiaphragm elevation.  IMPRESSION: Cardiomegaly and moderate left hemidiaphragm elevation.  Bibasilar atelectasis without convincing evidence of acute superimposed process.   Original Report Authenticated By: Jeronimo Greaves, M.D.   Dg Chest Port 1 View  07/29/2012   *RADIOLOGY REPORT*  Clinical Data: 72 year old female tracheostomy replacement.  PORTABLE CHEST - 1 VIEW  Comparison: 07/15/2012 and earlier.  Findings: AP portable semi upright view 1820 hours.  Tracheostomy tube projects in the midline and appears adequately positioned. Stable lung volumes. Stable cardiomegaly and mediastinal contours. Interval mildly decreased retrocardiac opacity with loss of definition of the left hemidiaphragm.  No pneumothorax or pulmonary edema.  No definite effusion.  IMPRESSION: 1.  Tracheostomy tube in place with no adverse features. 2.  Chronic cardiomegaly and mildly decreased retrocardiac ventilation.   Original Report Authenticated By: Erskine Speed, M.D.         Subjective: Patient is breathing a little bit better patient has been physical therapy today denies any chest discomfort, dizziness, nausea, vomiting, diarrhea, abdominal pain.  Objective: Filed Vitals:   08/22/12 0912 08/22/12 1100 08/22/12 1211 08/22/12 1444  BP: 130/60 153/64  131/68  Pulse:  63 59 69  Temp:  97.8 F (36.6 C)  97.7 F (36.5 C)  TempSrc:  Oral  Oral  Resp:  18 18 18   Height:      Weight:  SpO2:  98% 94% 95%     Intake/Output Summary (Last 24 hours) at 08/22/12 1703 Last data filed at 08/22/12 1445  Gross per 24 hour  Intake   1540 ml  Output   2175 ml  Net   -635 ml   Weight change: -0.6 kg (-1 lb 5.2 oz) Exam:   General:  Pt is alert, follows commands appropriately, not in acute distress  HEENT: No icterus, No thrush,  [place]/AT  Cardiovascular: RRR, S1/S2, no rubs, no gallops  Respiratory: Bibasilar crackles, left greater than right. Diminished breath sounds left base. No wheezes.  Abdomen: Soft/+BS, non tender, non distended, no guarding  Extremities: 1+ edema, No lymphangitis, No petechiae, No rashes, no synovitis  Data Reviewed: Basic Metabolic Panel:  Recent Labs Lab 08/20/12 0411 08/21/12 0605 08/22/12 0444  NA 142 139 138  K 3.7 4.1 4.3  CL 109 106 103  CO2 26 26 25   GLUCOSE 99 94 110*  BUN 40* 47* 57*  CREATININE 2.82* 2.74* 2.70*  CALCIUM 8.9 8.8 8.8  MG  --   --  1.8   Liver Function Tests: No results found for this basename: AST, ALT, ALKPHOS, BILITOT, PROT, ALBUMIN,  in the last 168 hours No results found for this basename: LIPASE, AMYLASE,  in the last 168 hours No results found for this basename: AMMONIA,  in the last 168 hours CBC:  Recent Labs Lab 08/20/12 0411  WBC 9.2  NEUTROABS 6.3  HGB 9.1*  HCT 29.9*  MCV 84.9  PLT 184   Cardiac Enzymes:  Recent Labs Lab 08/20/12 1107 08/20/12 1658 08/20/12 2212  TROPONINI <0.30 <0.30 <0.30   BNP: No components found with this basename: POCBNP,  CBG:  Recent Labs Lab 08/20/12 1111 08/20/12 1647  GLUCAP 100* 135*    No results found for this or any previous visit (from the past 240 hour(s)).   Scheduled Meds: . ipratropium  0.5 mg Nebulization Q6H   And  . albuterol  2.5 mg Nebulization Q6H  . amLODipine  10 mg Oral Daily  . antiseptic oral rinse  15 mL Mouth Rinse Daily  . arformoterol  15 mcg Nebulization BID  . aspirin  81 mg Oral Daily  . atorvastatin  40 mg Oral Daily  .  budesonide  0.25 mg Nebulization BID  . busPIRone  5 mg Oral BID  . carvedilol  6.25 mg Oral BID WC  . cloNIDine  0.1 mg Oral BID  . clopidogrel  75 mg Oral Q breakfast  . dextromethorphan-guaiFENesin  1 tablet Oral BID  . enoxaparin (LOVENOX) injection  30 mg Subcutaneous Q24H  . ferrous sulfate  325 mg Oral BID  . hydrALAZINE  100 mg Oral TID  . isosorbide mononitrate  30 mg Oral Daily  . metoCLOPramide  5 mg Oral QID  . pantoprazole  40 mg Oral Daily  . predniSONE  50 mg Oral QPC breakfast  . sertraline  100 mg Oral QHS  . sodium chloride  3 mL Intravenous Q12H  . vitamin C  500 mg Oral Daily   Continuous Infusions:    Emmons Toth, DO  [facility] Pager [phone]  If 7PM-7AM, please contact night-coverage [email or web address] Password [email or web address] 08/22/2012, 5:03 PM   LOS: 2 days

## 2012-08-22 NOTE — Progress Notes (Signed)
1830 back to bed . Trach secured . No apparent distress . On  pas[place] - mauri . Speech clear . Watching tv

## 2012-08-23 DIAGNOSIS — E1159 Type 2 diabetes mellitus with other circulatory complications: Secondary | ICD-10-CM

## 2012-08-23 DIAGNOSIS — I798 Other disorders of arteries, arterioles and capillaries in diseases classified elsewhere: Secondary | ICD-10-CM

## 2012-08-23 LAB — BASIC METABOLIC PANEL
CO2: 24 mEq/L (ref 19–32)
Calcium: 8.9 mg/dL (ref 8.4–10.5)
Chloride: 102 mEq/L (ref 96–112)
Creatinine, Ser: 2.95 mg/dL — ABNORMAL HIGH (ref 0.50–1.10)
Glucose, Bld: 90 mg/dL (ref 70–99)

## 2012-08-23 MED ORDER — FUROSEMIDE 80 MG PO TABS
80.0000 mg | ORAL_TABLET | Freq: Two times a day (BID) | ORAL | Status: DC
  Administered 2012-08-23 – 2012-08-26 (×6): 80 mg via ORAL
  Filled 2012-08-23 (×9): qty 1

## 2012-08-23 NOTE — Progress Notes (Signed)
[facility] PROGRESS NOTE  [name] WUJ:[id number] DOB: [birth year] DOA: 08/20/2012 PCP: Gwynneth Aliment, MD  Assessment/Plan: Acute on chronic respiratory failure  -This is likely multifactorial including acute on chronic combined CHF, cor pulmonale, COPD, mucus plugging, OSA, and deconditioning  -The patient currently does not have any respiratory distress  -Will address the above issues as discussed below  [facility] status has improved since admission; currently stable on trach collar 28% FiO2 Acute on chronic combined CHF  -Clinically, the patient remains fluid overloaded  -initial proBNP 10275 -Although her chest x-ray does not suggest significant pulmonary edema, I suspect that she mostly has right-sided CHF, cor pulmonale  -She has significant peripheral edema and JVD which have improved. -She has received one dose of intravenous furosemide in the emergency department patient; received an additional 40 mg IV evening of 08/20/2012  -Change IV furosemide to oral lasix. -Echocardiogram on 07/13/2012 shows EF 45%, grade 2 diastolic dysfunction  COPD/cor pulmonale  -Does not appear to be decompensated although she does have some scattered wheezing  -Patient s/p one dose of intravenous Solu-Medrol  -Contiunue oral prednisone and weaned as tolerated  -Aerosolized albuterol and Atrovent as well as Brovana  -Aggressive pulmonary toilet  -Physical therapy Acute on chronic renal failure, CKD stage IV  -Monitor renal function--Cr slowly rising. Change IV lasix to oral lasix. Follow. -Baseline creatinine 2.5-2.7 History of NSTEMI (07/08/2012 admission)  -Cycle troponins--neg -Continue aspirin, Plavix, statin  Hypertension  -Continue amlodipine, carvedilol, hydralazine, clonidine  Anemia  -Multifactorial including anemia of chronic disease and iron deficiency  -Continue iron  GERD  -Continue PPI  Family Communication: Pt at beside  Disposition Plan: Home when  medically stable        Procedures/Studies: Dg Chest 2 View  08/20/2012   *RADIOLOGY REPORT*  Clinical Data: Shortness of breath.  Hypertension.  Cardiomyopathy.  CHEST - 2 VIEW  Comparison: 07/29/2012  Findings: Tracheostomy appropriately positioned.  Moderate cardiomegaly with atherosclerosis in the transverse aorta.  No definite pleural fluid. No pneumothorax.  No congestive failure.  Mild right base volume loss and subsegmental atelectasis.  Slight improvement in left base aeration. Probable subsegmental atelectasis remaining.  Moderate left hemidiaphragm elevation.  IMPRESSION: Cardiomegaly and moderate left hemidiaphragm elevation.  Bibasilar atelectasis without convincing evidence of acute superimposed process.   Original Report Authenticated By: Jeronimo Greaves, M.D.   Dg Chest Port 1 View  07/29/2012   *RADIOLOGY REPORT*  Clinical Data: 72 year old female tracheostomy replacement.  PORTABLE CHEST - 1 VIEW  Comparison: 07/15/2012 and earlier.  Findings: AP portable semi upright view 1820 hours.  Tracheostomy tube projects in the midline and appears adequately positioned. Stable lung volumes. Stable cardiomegaly and mediastinal contours. Interval mildly decreased retrocardiac opacity with loss of definition of the left hemidiaphragm.  No pneumothorax or pulmonary edema.  No definite effusion.  IMPRESSION: 1.  Tracheostomy tube in place with no adverse features. 2.  Chronic cardiomegaly and mildly decreased retrocardiac ventilation.   Original Report Authenticated By: Erskine Speed, M.D.         Subjective: Patient still c/o some SOB.  Objective: Filed Vitals:   08/23/12 0322 08/23/12 0451 08/23/12 0842 08/23/12 1100  BP:  178/66    Pulse: 66 72 61 60  Temp:  97.8 F (36.6 C)    TempSrc:  Oral    Resp: 48 22 18 20   Height:      Weight:  78.3 kg (172 lb 9.9 oz)    SpO2: 95% 97% 94% 97%  Intake/Output Summary (Last 24 hours) at 08/23/12 1252 Last data filed at 08/23/12 0840   Gross per 24 hour  Intake   1430 ml  Output   2300 ml  Net   -870 ml   Weight change: 1.2 kg (2 lb 10.3 oz) Exam:   General:  Pt is alert, follows commands appropriately, not in acute distress  HEENT: No icterus, No thrush,  [place]/AT  Cardiovascular: RRR, S1/S2, no rubs, no gallops  Respiratory: Bibasilar crackles, left greater than right. Diminished breath sounds left base. No wheezes.  Abdomen: Soft/+BS, non tender, non distended, no guarding  Extremities: TRACE - 1+ edema, No lymphangitis, No petechiae, No rashes, no synovitis  Data Reviewed: Basic Metabolic Panel:  Recent Labs Lab 08/20/12 0411 08/21/12 0605 08/22/12 0444 08/23/12 0521  NA 142 139 138 140  K 3.7 4.1 4.3 4.2  CL 109 106 103 102  CO2 26 26 25 24   GLUCOSE 99 94 110* 90  BUN 40* 47* 57* 65*  CREATININE 2.82* 2.74* 2.70* 2.95*  CALCIUM 8.9 8.8 8.8 8.9  MG  --   --  1.8  --    Liver Function Tests: No results found for this basename: AST, ALT, ALKPHOS, BILITOT, PROT, ALBUMIN,  in the last 168 hours No results found for this basename: LIPASE, AMYLASE,  in the last 168 hours No results found for this basename: AMMONIA,  in the last 168 hours CBC:  Recent Labs Lab 08/20/12 0411  WBC 9.2  NEUTROABS 6.3  HGB 9.1*  HCT 29.9*  MCV 84.9  PLT 184   Cardiac Enzymes:  Recent Labs Lab 08/20/12 1107 08/20/12 1658 08/20/12 2212  TROPONINI <0.30 <0.30 <0.30   BNP: No components found with this basename: POCBNP,  CBG:  Recent Labs Lab 08/20/12 1111 08/20/12 1647  GLUCAP 100* 135*    No results found for this or any previous visit (from the past 240 hour(s)).   Scheduled Meds: . ipratropium  0.5 mg Nebulization Q6H   And  . albuterol  2.5 mg Nebulization Q6H  . amLODipine  10 mg Oral Daily  . antiseptic oral rinse  15 mL Mouth Rinse Daily  . arformoterol  15 mcg Nebulization BID  . aspirin  81 mg Oral Daily  . atorvastatin  40 mg Oral Daily  . budesonide  0.25 mg Nebulization BID  .  busPIRone  5 mg Oral BID  . carvedilol  6.25 mg Oral BID WC  . cloNIDine  0.1 mg Oral BID  . clopidogrel  75 mg Oral Q breakfast  . dextromethorphan-guaiFENesin  1 tablet Oral BID  . enoxaparin (LOVENOX) injection  30 mg Subcutaneous Q24H  . ferrous sulfate  325 mg Oral BID  . furosemide  80 mg Intravenous BID  . hydrALAZINE  100 mg Oral TID  . isosorbide mononitrate  30 mg Oral Daily  . metoCLOPramide  5 mg Oral QID  . pantoprazole  40 mg Oral Daily  . predniSONE  50 mg Oral QPC breakfast  . sertraline  100 mg Oral QHS  . sodium chloride  3 mL Intravenous Q12H  . vitamin C  500 mg Oral Daily   Continuous Infusions:    Alfredo Spong, MD  [facility] Pager [phone]  If 7PM-7AM, please contact night-coverage [email or web address] Password [email or web address] 08/23/2012, 12:52 PM   LOS: 3 days

## 2012-08-23 NOTE — Significant Event (Signed)
No apparent distress . On [place] - mauri . Speech clear . Watching tv

## 2012-08-23 NOTE — Progress Notes (Signed)
Physical Therapy Treatment Patient Details Name: [name] MRN: [id number] DOB: 1940/03/16 Today's Date: 08/23/2012 Time: 4098-1191 PT Time Calculation (min): 13 min  PT Assessment / Plan / Recommendation  PT Comments   Pt appears depressed. Pt educated extensively on importance of OOB mobility to maintain strength and prevent deconditioning. Pt adamently refused and was minimal verbose. Pt shook head no to feeling depressed or having pain but offered no reason as to why she refuses consistently to get up OOB. Pt agree she wants to return home but is unwilling to participate PT and demo safe OOB mobility for safe d/c home once medically appropriate. Spoke with RN regarding pt's depressed spirits and refusal for OOB mobility.                   PT Goals (current goals can now be found in the care plan section)    Visit Information  Last PT Received On: 08/23/12 Assistance Needed: +1    Subjective Data      Cognition    appears depressed- although denies depression          GP     [name] 08/23/2012, 3:24 PM  Lewis Shock, PT, DPT Pager #: [phone] Office #: [phone]

## 2012-08-24 ENCOUNTER — Inpatient Hospital Stay (HOSPITAL_COMMUNITY): Payer: Medicare Other

## 2012-08-24 DIAGNOSIS — R0609 Other forms of dyspnea: Secondary | ICD-10-CM

## 2012-08-24 LAB — CBC
HCT: 29 % — ABNORMAL LOW (ref 36.0–46.0)
Platelets: 227 10*3/uL (ref 150–400)
RDW: 17.3 % — ABNORMAL HIGH (ref 11.5–15.5)
WBC: 10.5 10*3/uL (ref 4.0–10.5)

## 2012-08-24 LAB — BASIC METABOLIC PANEL
BUN: 69 mg/dL — ABNORMAL HIGH (ref 6–23)
CO2: 25 mEq/L (ref 19–32)
Calcium: 8.5 mg/dL (ref 8.4–10.5)
GFR calc Af Amer: 18 mL/min — ABNORMAL LOW (ref >90)
GFR calc non Af Amer: 16 mL/min — ABNORMAL LOW (ref >90)
Glucose, Bld: 84 mg/dL (ref 70–99)
Sodium: 137 mEq/L (ref 135–145)

## 2012-08-24 MED ORDER — PREDNISONE 20 MG PO TABS
40.0000 mg | ORAL_TABLET | Freq: Every day | ORAL | Status: DC
  Administered 2012-08-25 – 2012-08-28 (×4): 40 mg via ORAL
  Filled 2012-08-24 (×6): qty 2

## 2012-08-24 NOTE — Progress Notes (Signed)
[facility] PROGRESS NOTE  [name] XBJ:[id number] DOB: 1940-09-19 DOA: 08/20/2012 PCP: Gwynneth Aliment, MD  Assessment/Plan: Acute on chronic respiratory failure  -This is likely multifactorial including acute on chronic combined CHF, cor pulmonale, COPD, mucus plugging, OSA, and deconditioning  -The patient currently does not have any respiratory distress, however feels SOB not significantly improved. Check CXR. Patient refusing ABG. Continue current treatments. -Will address the above issues as discussed below  [facility] status has improved since admission; currently stable on trach collar 28% FiO2 Acute on chronic combined CHF  -Clinically, the patient is improving.  -initial proBNP 10275 -Although her chest x-ray does not suggest significant pulmonary edema, I suspect that she mostly has right-sided CHF, cor pulmonale  -She had significant peripheral edema and JVD which have improved. -She has received one dose of intravenous furosemide in the emergency department patient; received an additional 40 mg IV evening of 08/20/2012  -Changed IV furosemide to oral lasix yesterday -Echocardiogram on 07/13/2012 shows EF 45%, grade 2 diastolic dysfunction  COPD/cor pulmonale  -Does not appear to be decompensated although she does have some scattered wheezing  -Patient s/p one dose of intravenous Solu-Medrol  -Contiunue oral prednisone and weaned as tolerated  -Aerosolized albuterol and Atrovent as well as Brovana  -Aggressive pulmonary toilet  -Physical therapy Acute on chronic renal failure, CKD stage IV  -Monitor renal function--Cr was slowly rising yesterday but trending back down today after switch to oral lasix. Follow. -Baseline creatinine 2.5-2.7 History of NSTEMI (07/08/2012 admission)  -Cycle troponins--neg -Continue aspirin, Plavix, statin  Hypertension  -Continue amlodipine, carvedilol, hydralazine, clonidine  Anemia  -Multifactorial including anemia of  chronic disease and iron deficiency  -Continue iron  GERD  -Continue PPI  Family Communication: Pt at beside  Disposition Plan: Home when medically stable        Procedures/Studies: Dg Chest 2 View  08/20/2012   *RADIOLOGY REPORT*  Clinical Data: Shortness of breath.  Hypertension.  Cardiomyopathy.  CHEST - 2 VIEW  Comparison: 07/29/2012  Findings: Tracheostomy appropriately positioned.  Moderate cardiomegaly with atherosclerosis in the transverse aorta.  No definite pleural fluid. No pneumothorax.  No congestive failure.  Mild right base volume loss and subsegmental atelectasis.  Slight improvement in left base aeration. Probable subsegmental atelectasis remaining.  Moderate left hemidiaphragm elevation.  IMPRESSION: Cardiomegaly and moderate left hemidiaphragm elevation.  Bibasilar atelectasis without convincing evidence of acute superimposed process.   Original Report Authenticated By: Jeronimo Greaves, M.D.   Dg Chest Port 1 View  07/29/2012   *RADIOLOGY REPORT*  Clinical Data: 72 year old female tracheostomy replacement.  PORTABLE CHEST - 1 VIEW  Comparison: 07/15/2012 and earlier.  Findings: AP portable semi upright view 1820 hours.  Tracheostomy tube projects in the midline and appears adequately positioned. Stable lung volumes. Stable cardiomegaly and mediastinal contours. Interval mildly decreased retrocardiac opacity with loss of definition of the left hemidiaphragm.  No pneumothorax or pulmonary edema.  No definite effusion.  IMPRESSION: 1.  Tracheostomy tube in place with no adverse features. 2.  Chronic cardiomegaly and mildly decreased retrocardiac ventilation.   Original Report Authenticated By: Erskine Speed, M.D.         Subjective: Patient still c/o some SOB. Patient states has been breathing heavy and not feeling that much better.  Objective: Filed Vitals:   08/24/12 0203 08/24/12 0406 08/24/12 0447 08/24/12 0711  BP:   169/67   Pulse:  61 62 61  Temp:   98.4 F (36.9 C)    TempSrc:  Oral   Resp:  18 18 18   Height:      Weight:   80.2 kg (176 lb 12.9 oz)   SpO2: 97% 98% 94% 96%    Intake/Output Summary (Last 24 hours) at 08/24/12 1038 Last data filed at 08/24/12 0826  Gross per 24 hour  Intake   1080 ml  Output   1101 ml  Net    -21 ml   Weight change: 1.9 kg (4 lb 3 oz) Exam:   General:  Pt is alert, follows commands appropriately, not in acute distress  HEENT: No icterus, No thrush,  [place]/AT  Cardiovascular: RRR, S1/S2, no rubs, no gallops  Respiratory: Diminished breath sounds left base. No wheezes.  Abdomen: Soft/+BS, non tender, non distended, no guarding  Extremities: TRACE  edema, No lymphangitis, No petechiae, No rashes, no synovitis  Data Reviewed: Basic Metabolic Panel:  Recent Labs Lab 08/20/12 0411 08/21/12 0605 08/22/12 0444 08/23/12 0521 08/24/12 0510  NA 142 139 138 140 137  K 3.7 4.1 4.3 4.2 4.2  CL 109 106 103 102 102  CO2 26 26 25 24 25   GLUCOSE 99 94 110* 90 84  BUN 40* 47* 57* 65* 69*  CREATININE 2.82* 2.74* 2.70* 2.95* 2.80*  CALCIUM 8.9 8.8 8.8 8.9 8.5  MG  --   --  1.8  --   --    Liver Function Tests: No results found for this basename: AST, ALT, ALKPHOS, BILITOT, PROT, ALBUMIN,  in the last 168 hours No results found for this basename: LIPASE, AMYLASE,  in the last 168 hours No results found for this basename: AMMONIA,  in the last 168 hours CBC:  Recent Labs Lab 08/20/12 0411 08/24/12 0510  WBC 9.2 10.5  NEUTROABS 6.3  --   HGB 9.1* 9.0*  HCT 29.9* 29.0*  MCV 84.9 81.9  PLT 184 227   Cardiac Enzymes:  Recent Labs Lab 08/20/12 1107 08/20/12 1658 08/20/12 2212  TROPONINI <0.30 <0.30 <0.30   BNP: No components found with this basename: POCBNP,  CBG:  Recent Labs Lab 08/20/12 1111 08/20/12 1647  GLUCAP 100* 135*    No results found for this or any previous visit (from the past 240 hour(s)).   Scheduled Meds: . ipratropium  0.5 mg Nebulization Q6H   And  . albuterol  2.5  mg Nebulization Q6H  . amLODipine  10 mg Oral Daily  . antiseptic oral rinse  15 mL Mouth Rinse Daily  . arformoterol  15 mcg Nebulization BID  . aspirin  81 mg Oral Daily  . atorvastatin  40 mg Oral Daily  . budesonide  0.25 mg Nebulization BID  . busPIRone  5 mg Oral BID  . carvedilol  6.25 mg Oral BID WC  . cloNIDine  0.1 mg Oral BID  . clopidogrel  75 mg Oral Q breakfast  . dextromethorphan-guaiFENesin  1 tablet Oral BID  . enoxaparin (LOVENOX) injection  30 mg Subcutaneous Q24H  . ferrous sulfate  325 mg Oral BID  . furosemide  80 mg Oral BID  . hydrALAZINE  100 mg Oral TID  . isosorbide mononitrate  30 mg Oral Daily  . metoCLOPramide  5 mg Oral QID  . pantoprazole  40 mg Oral Daily  . [START ON 08/25/2012] predniSONE  40 mg Oral QPC breakfast  . sertraline  100 mg Oral QHS  . sodium chloride  3 mL Intravenous Q12H  . vitamin C  500 mg Oral Daily   Continuous Infusions:  [facility], MD  [facility] Pager [phone]  If 7PM-7AM, please contact night-coverage [email or web address] Password [email or web address] 08/24/2012, 10:38 AM   LOS: 4 days

## 2012-08-24 NOTE — Plan of Care (Signed)
Problem: Phase I Progression Outcomes Goal: O2 sats > or equal 90% or at baseline Outcome: Completed/Met Date Met:  08/24/12 O2 sats WNL  Goal: Dyspnea controlled at rest Outcome: Completed/Met Date Met:  08/24/12 Pt has not had any c/o SOB while at rest Goal: Pain controlled Outcome: Completed/Met Date Met:  08/24/12 Pt has not had any c/o pain Goal: Tolerating diet Outcome: Completed/Met Date Met:  08/24/12 Pt tolerating diet, no c/o n/v

## 2012-08-24 NOTE — Progress Notes (Signed)
Pt. Alert and oriented this am. No s/s of distress or discomfort noted. Pt. Rested well during the night. No c/o pain. Pt. Resting in bed quietly. Call light at bedside and within reach. RN will continue to monitor pt. For changes in condition. Aviv Rota, Cheryll Dessert

## 2012-08-25 LAB — CBC
Hemoglobin: 9 g/dL — ABNORMAL LOW (ref 12.0–15.0)
MCH: 25.4 pg — ABNORMAL LOW (ref 26.0–34.0)
MCHC: 31.4 g/dL (ref 30.0–36.0)
Platelets: 239 10*3/uL (ref 150–400)
RDW: 16.8 % — ABNORMAL HIGH (ref 11.5–15.5)

## 2012-08-25 LAB — BASIC METABOLIC PANEL
BUN: 69 mg/dL — ABNORMAL HIGH (ref 6–23)
Calcium: 8.9 mg/dL (ref 8.4–10.5)
GFR calc Af Amer: 18 mL/min — ABNORMAL LOW (ref >90)
GFR calc non Af Amer: 16 mL/min — ABNORMAL LOW (ref >90)
Glucose, Bld: 99 mg/dL (ref 70–99)
Potassium: 4.2 mEq/L (ref 3.5–5.1)

## 2012-08-25 LAB — DIFFERENTIAL
Eosinophils Absolute: 0 10*3/uL (ref 0.0–0.7)
Eosinophils Relative: 0 % (ref 0–5)
Lymphocytes Relative: 15 % (ref 12–46)
Lymphs Abs: 1.6 10*3/uL (ref 0.7–4.0)
Monocytes Absolute: 1.1 10*3/uL — ABNORMAL HIGH (ref 0.1–1.0)
Monocytes Relative: 10 % (ref 3–12)

## 2012-08-25 MED ORDER — ALBUTEROL SULFATE (5 MG/ML) 0.5% IN NEBU
2.5000 mg | INHALATION_SOLUTION | RESPIRATORY_TRACT | Status: DC | PRN
  Administered 2012-08-25: 2.5 mg via RESPIRATORY_TRACT
  Filled 2012-08-25: qty 0.5

## 2012-08-25 NOTE — Progress Notes (Signed)
MD ordered ABG- patient refused to let me stick her for blood, MD notified

## 2012-08-25 NOTE — Progress Notes (Signed)
Pt. Alert and oriented. No s/s of distress or discomfort noted this am. No c/o pain. Pt. Requested breathing treatment during the night. RRT notified. On call for [facility] notified via text page. New orders for PRN nebulizer treatments ordered for pt. RRT in pts. Room to administer breathing treatment. Pt. Resting comfortably at this time. Call light within reach. RN will continue to monitor pt. For changes in condition. Sabatino Williard, Cheryll Dessert

## 2012-08-25 NOTE — Progress Notes (Signed)
[facility] PROGRESS NOTE  [name] WUJ:[id number] DOB: [birth year] DOA: 08/20/2012 PCP: Gwynneth Aliment, MD  Assessment/Plan: Acute on chronic respiratory failure  -This is likely multifactorial including acute on chronic combined CHF, cor pulmonale, COPD, mucus plugging, OSA, and deconditioning  -The patient currently does not have any respiratory distress, however feels SOB not significantly improved. Check CXR. Patient refusing ABG. Continue current treatments. -Will address the above issues as discussed below  [facility] status has improved since admission; currently stable on trach collar 28% FiO2 Acute on chronic combined CHF  -Clinically, the patient is improving.  -initial proBNP 10275 -Although her chest x-ray does not suggest significant pulmonary edema, I suspect that she mostly has right-sided CHF, cor pulmonale  -She had significant peripheral edema and JVD which have improved. -She has received one dose of intravenous furosemide in the emergency department patient; received an additional 40 mg IV evening of 08/20/2012  -Changed IV furosemide to oral lasix. -Echocardiogram on 07/13/2012 shows EF 45%, grade 2 diastolic dysfunction  COPD/cor pulmonale  -Does not appear to be decompensated although she does have some scattered wheezing  -Patient s/p one dose of intravenous Solu-Medrol  -Contiunue oral prednisone and weaned as tolerated  -Aerosolized albuterol and Atrovent as well as Brovana  -Aggressive pulmonary toilet  -Physical therapy Acute on chronic renal failure, CKD stage IV  -Monitor renal function--Cr was slowly rising yesterday but trending back down yesterday after switch to oral lasix. Follow. -Baseline creatinine 2.5-2.7 History of NSTEMI (07/08/2012 admission)  -Cycle troponins--neg -Continue aspirin, Plavix, statin  Hypertension  -Continue amlodipine, carvedilol, hydralazine, clonidine  Anemia  -Multifactorial including anemia of chronic  disease and iron deficiency  -Continue iron  GERD  -Continue PPI  Family Communication: Pt at beside  Disposition Plan: Home when medically stable        Procedures/Studies: Dg Chest 2 View  08/20/2012   *RADIOLOGY REPORT*  Clinical Data: Shortness of breath.  Hypertension.  Cardiomyopathy.  CHEST - 2 VIEW  Comparison: 07/29/2012  Findings: Tracheostomy appropriately positioned.  Moderate cardiomegaly with atherosclerosis in the transverse aorta.  No definite pleural fluid. No pneumothorax.  No congestive failure.  Mild right base volume loss and subsegmental atelectasis.  Slight improvement in left base aeration. Probable subsegmental atelectasis remaining.  Moderate left hemidiaphragm elevation.  IMPRESSION: Cardiomegaly and moderate left hemidiaphragm elevation.  Bibasilar atelectasis without convincing evidence of acute superimposed process.   Original Report Authenticated By: Jeronimo Greaves, M.D.   Dg Chest Port 1 View  07/29/2012   *RADIOLOGY REPORT*  Clinical Data: 72 year old female tracheostomy replacement.  PORTABLE CHEST - 1 VIEW  Comparison: 07/15/2012 and earlier.  Findings: AP portable semi upright view 1820 hours.  Tracheostomy tube projects in the midline and appears adequately positioned. Stable lung volumes. Stable cardiomegaly and mediastinal contours. Interval mildly decreased retrocardiac opacity with loss of definition of the left hemidiaphragm.  No pneumothorax or pulmonary edema.  No definite effusion.  IMPRESSION: 1.  Tracheostomy tube in place with no adverse features. 2.  Chronic cardiomegaly and mildly decreased retrocardiac ventilation.   Original Report Authenticated By: Erskine Speed, M.D.         Subjective: Patient still c/o some SOB, however starting to feel better.  Objective: Filed Vitals:   08/25/12 0606 08/25/12 0647 08/25/12 0900 08/25/12 0954  BP:  138/55    Pulse:  65 62   Temp:  98 F (36.7 C)    TempSrc:  Oral    Resp:  16 18  Height:       Weight:  79.379 kg (175 lb)    SpO2: 96% 94% 97% 99%    Intake/Output Summary (Last 24 hours) at 08/25/12 1031 Last data filed at 08/25/12 0816  Gross per 24 hour  Intake    960 ml  Output   2200 ml  Net  -1240 ml   Weight change: -0.82 kg (-1 lb 12.9 oz) Exam:   General:  Pt is alert, follows commands appropriately, not in acute distress  HEENT: No icterus, No thrush,  [place]/AT  Cardiovascular: RRR, S1/S2, no rubs, no gallops  Respiratory: Diminished breath sounds left base. No wheezes.  Abdomen: Soft/+BS, non tender, non distended, no guarding  Extremities: TRACE  edema, No lymphangitis, No petechiae, No rashes, no synovitis  Data Reviewed: Basic Metabolic Panel:  Recent Labs Lab 08/21/12 0605 08/22/12 0444 08/23/12 0521 08/24/12 0510 08/25/12 0500  NA 139 138 140 137 138  K 4.1 4.3 4.2 4.2 4.2  CL 106 103 102 102 102  CO2 26 25 24 25 28   GLUCOSE 94 110* 90 84 99  BUN 47* 57* 65* 69* 69*  CREATININE 2.74* 2.70* 2.95* 2.80* 2.81*  CALCIUM 8.8 8.8 8.9 8.5 8.9  MG  --  1.8  --   --   --    Liver Function Tests: No results found for this basename: AST, ALT, ALKPHOS, BILITOT, PROT, ALBUMIN,  in the last 168 hours No results found for this basename: LIPASE, AMYLASE,  in the last 168 hours No results found for this basename: AMMONIA,  in the last 168 hours CBC:  Recent Labs Lab 08/20/12 0411 08/24/12 0510 08/25/12 0500  WBC 9.2 10.5 10.9*  NEUTROABS 6.3  --  8.0*  HGB 9.1* 9.0* 9.0*  HCT 29.9* 29.0* 28.7*  MCV 84.9 81.9 81.1  PLT 184 227 239   Cardiac Enzymes:  Recent Labs Lab 08/20/12 1107 08/20/12 1658 08/20/12 2212  TROPONINI <0.30 <0.30 <0.30   BNP: No components found with this basename: POCBNP,  CBG:  Recent Labs Lab 08/20/12 1111 08/20/12 1647  GLUCAP 100* 135*    No results found for this or any previous visit (from the past 240 hour(s)).   Scheduled Meds: . ipratropium  0.5 mg Nebulization Q6H   And  . albuterol  2.5 mg  Nebulization Q6H  . amLODipine  10 mg Oral Daily  . antiseptic oral rinse  15 mL Mouth Rinse Daily  . arformoterol  15 mcg Nebulization BID  . aspirin  81 mg Oral Daily  . atorvastatin  40 mg Oral Daily  . budesonide  0.25 mg Nebulization BID  . busPIRone  5 mg Oral BID  . carvedilol  6.25 mg Oral BID WC  . cloNIDine  0.1 mg Oral BID  . clopidogrel  75 mg Oral Q breakfast  . dextromethorphan-guaiFENesin  1 tablet Oral BID  . enoxaparin (LOVENOX) injection  30 mg Subcutaneous Q24H  . ferrous sulfate  325 mg Oral BID  . furosemide  80 mg Oral BID  . hydrALAZINE  100 mg Oral TID  . isosorbide mononitrate  30 mg Oral Daily  . metoCLOPramide  5 mg Oral QID  . pantoprazole  40 mg Oral Daily  . predniSONE  40 mg Oral QPC breakfast  . sertraline  100 mg Oral QHS  . sodium chloride  3 mL Intravenous Q12H  . vitamin C  500 mg Oral Daily   Continuous Infusions:    Aniello Christopoulos, MD  [facility]  Pager [phone]  If 7PM-7AM, please contact night-coverage [email or web address] Password [email or web address] 08/25/2012, 10:31 AM   LOS: 5 days

## 2012-08-25 NOTE — Progress Notes (Signed)
PT Cancellation Note  Patient Details Name: TOMEIKA WEINMANN MRN: [id number] DOB: 01-Jan-1941   Cancelled Treatment:    Reason Eval/Treat Not Completed: Patient declined, no reason specified   For [facility], PT Vena Austria 08/25/2012, 5:49 PM Durenda Hurt. Renaldo Fiddler, [facility] Acute Rehab Services Pager [phone]

## 2012-08-25 NOTE — Plan of Care (Signed)
Problem: Phase II Progression Outcomes Goal: Pain controlled on oral analgesia Outcome: Completed/Met Date Met:  08/25/12 Pt has not had any c/o pain Goal: Tolerating diet Outcome: Completed/Met Date Met:  08/25/12 Pt tolerating diet well, no c/o n/v

## 2012-08-26 DIAGNOSIS — I5023 Acute on chronic systolic (congestive) heart failure: Secondary | ICD-10-CM

## 2012-08-26 LAB — BASIC METABOLIC PANEL
BUN: 75 mg/dL — ABNORMAL HIGH (ref 6–23)
CO2: 25 mEq/L (ref 19–32)
Calcium: 8.8 mg/dL (ref 8.4–10.5)
Creatinine, Ser: 2.98 mg/dL — ABNORMAL HIGH (ref 0.50–1.10)
GFR calc non Af Amer: 15 mL/min — ABNORMAL LOW (ref >90)
Glucose, Bld: 103 mg/dL — ABNORMAL HIGH (ref 70–99)
Sodium: 139 mEq/L (ref 135–145)

## 2012-08-26 LAB — CBC
MCH: 25.2 pg — ABNORMAL LOW (ref 26.0–34.0)
MCHC: 31.3 g/dL (ref 30.0–36.0)
MCV: 80.5 fL (ref 78.0–100.0)
Platelets: 273 10*3/uL (ref 150–400)
RBC: 3.65 MIL/uL — ABNORMAL LOW (ref 3.87–5.11)

## 2012-08-26 MED ORDER — FUROSEMIDE 40 MG PO TABS
40.0000 mg | ORAL_TABLET | Freq: Two times a day (BID) | ORAL | Status: DC
  Administered 2012-08-26 – 2012-08-30 (×8): 40 mg via ORAL
  Filled 2012-08-26 (×10): qty 1

## 2012-08-26 NOTE — Progress Notes (Signed)
[facility] PROGRESS NOTE  [name] [name]:[id number] DOB: August 23, 1940 DOA: 08/20/2012 PCP: Gwynneth Aliment, MD  Assessment/Plan: Acute on chronic respiratory failure  -This is likely multifactorial including acute on chronic combined CHF, cor pulmonale, COPD, mucus plugging, OSA, and deconditioning  -The patient currently does not have any respiratory distress, however feels SOB not significantly improved. Check CXR. Patient refusing ABG. Continue current treatments. -Will address the above issues as discussed below  [facility] status has improved since admission; currently stable on trach collar 28% FiO2 Acute on chronic combined CHF  -Clinically, the patient is improving.  -initial proBNP 10275 -Although her chest x-ray does not suggest significant pulmonary edema, I suspect that she mostly has right-sided CHF, cor pulmonale  -She had significant peripheral edema and JVD which have improved. -She has received one dose of intravenous furosemide in the emergency department patient; received an additional 40 mg IV evening of 08/20/2012  -Changed IV furosemide to oral lasix. -Echocardiogram on 07/13/2012 shows EF 45%, grade 2 diastolic dysfunction  COPD/cor pulmonale  -Does not appear to be decompensated although she does have some scattered wheezing  -Patient s/p one dose of intravenous Solu-Medrol  -Contiunue oral prednisone and weaned as tolerated  -Aerosolized albuterol and Atrovent as well as Brovana  -Aggressive pulmonary toilet  -Physical therapy Acute on chronic renal failure, CKD stage IV  -Monitor renal function--Cr was slowly rising yesterday but trending back down yesterday after switch to oral lasix. Decrease lasix dose to 40mg  BID. Follow. -Baseline creatinine 2.5-2.7 History of NSTEMI (07/08/2012 admission)  -Cycle troponins--neg -Continue aspirin, Plavix, statin  Hypertension  -Continue amlodipine, carvedilol, hydralazine, clonidine  Anemia   -Multifactorial including anemia of chronic disease and iron deficiency  -Continue iron  GERD  -Continue PPI  Family Communication: Pt at beside  Disposition Plan: Home when medically stable        Procedures/Studies: Dg Chest 2 View  08/20/2012   *RADIOLOGY REPORT*  Clinical Data: Shortness of breath.  Hypertension.  Cardiomyopathy.  CHEST - 2 VIEW  Comparison: 07/29/2012  Findings: Tracheostomy appropriately positioned.  Moderate cardiomegaly with atherosclerosis in the transverse aorta.  No definite pleural fluid. No pneumothorax.  No congestive failure.  Mild right base volume loss and subsegmental atelectasis.  Slight improvement in left base aeration. Probable subsegmental atelectasis remaining.  Moderate left hemidiaphragm elevation.  IMPRESSION: Cardiomegaly and moderate left hemidiaphragm elevation.  Bibasilar atelectasis without convincing evidence of acute superimposed process.   Original Report Authenticated By: Jeronimo Greaves, M.D.   Dg Chest Port 1 View  07/29/2012   *RADIOLOGY REPORT*  Clinical Data: 72 year old female tracheostomy replacement.  PORTABLE CHEST - 1 VIEW  Comparison: 07/15/2012 and earlier.  Findings: AP portable semi upright view 1820 hours.  Tracheostomy tube projects in the midline and appears adequately positioned. Stable lung volumes. Stable cardiomegaly and mediastinal contours. Interval mildly decreased retrocardiac opacity with loss of definition of the left hemidiaphragm.  No pneumothorax or pulmonary edema.  No definite effusion.  IMPRESSION: 1.  Tracheostomy tube in place with no adverse features. 2.  Chronic cardiomegaly and mildly decreased retrocardiac ventilation.   Original Report Authenticated By: Erskine Speed, M.D.         Subjective: Patient still c/o some SOB, however starting to feel better.  Objective: Filed Vitals:   08/26/12 0345 08/26/12 0518 08/26/12 0759 08/26/12 1014  BP:  172/64  179/66  Pulse: 59 71  76  Temp:  98.5 F (36.9  C)    TempSrc:  Oral  Resp: 16 16    Height:      Weight:  81.92 kg (180 lb 9.6 oz)    SpO2: 97% 97% 95%     Intake/Output Summary (Last 24 hours) at 08/26/12 1156 Last data filed at 08/26/12 0934  Gross per 24 hour  Intake   1293 ml  Output   2150 ml  Net   -857 ml   Weight change: 2.54 kg (5 lb 9.6 oz) Exam:   General:  Pt is alert, follows commands appropriately, not in acute distress  HEENT: No icterus, No thrush,  [place]/AT  Cardiovascular: RRR, S1/S2, no rubs, no gallops  Respiratory: Diminished breath sounds left base. min wheezes.  Abdomen: Soft/+BS, non tender, non distended, no guarding  Extremities: TRACE  edema, No lymphangitis, No petechiae, No rashes, no synovitis  Data Reviewed: Basic Metabolic Panel:  Recent Labs Lab 08/22/12 0444 08/23/12 0521 08/24/12 0510 08/25/12 0500 08/26/12 0611  NA 138 140 137 138 139  K 4.3 4.2 4.2 4.2 4.1  CL 103 102 102 102 100  CO2 25 24 25 28 25   GLUCOSE 110* 90 84 99 103*  BUN 57* 65* 69* 69* 75*  CREATININE 2.70* 2.95* 2.80* 2.81* 2.98*  CALCIUM 8.8 8.9 8.5 8.9 8.8  MG 1.8  --   --   --   --    Liver Function Tests: No results found for this basename: AST, ALT, ALKPHOS, BILITOT, PROT, ALBUMIN,  in the last 168 hours No results found for this basename: LIPASE, AMYLASE,  in the last 168 hours No results found for this basename: AMMONIA,  in the last 168 hours CBC:  Recent Labs Lab 08/20/12 0411 08/24/12 0510 08/25/12 0500 08/26/12 0611  WBC 9.2 10.5 10.9* 12.6*  NEUTROABS 6.3  --  8.0*  --   HGB 9.1* 9.0* 9.0* 9.2*  HCT 29.9* 29.0* 28.7* 29.4*  MCV 84.9 81.9 81.1 80.5  PLT 184 227 239 273   Cardiac Enzymes:  Recent Labs Lab 08/20/12 1107 08/20/12 1658 08/20/12 2212  TROPONINI <0.30 <0.30 <0.30   BNP: No components found with this basename: POCBNP,  CBG:  Recent Labs Lab 08/20/12 1111 08/20/12 1647  GLUCAP 100* 135*    No results found for this or any previous visit (from the past 240  hour(s)).   Scheduled Meds: . ipratropium  0.5 mg Nebulization Q6H   And  . albuterol  2.5 mg Nebulization Q6H  . amLODipine  10 mg Oral Daily  . antiseptic oral rinse  15 mL Mouth Rinse Daily  . arformoterol  15 mcg Nebulization BID  . aspirin  81 mg Oral Daily  . atorvastatin  40 mg Oral Daily  . budesonide  0.25 mg Nebulization BID  . busPIRone  5 mg Oral BID  . carvedilol  6.25 mg Oral BID WC  . cloNIDine  0.1 mg Oral BID  . clopidogrel  75 mg Oral Q breakfast  . dextromethorphan-guaiFENesin  1 tablet Oral BID  . enoxaparin (LOVENOX) injection  30 mg Subcutaneous Q24H  . ferrous sulfate  325 mg Oral BID  . furosemide  80 mg Oral BID  . hydrALAZINE  100 mg Oral TID  . isosorbide mononitrate  30 mg Oral Daily  . metoCLOPramide  5 mg Oral QID  . pantoprazole  40 mg Oral Daily  . predniSONE  40 mg Oral QPC breakfast  . sertraline  100 mg Oral QHS  . sodium chloride  3 mL Intravenous Q12H  . vitamin C  500 mg Oral Daily   Continuous Infusions:    Felicia Fasching, MD  [facility] Pager [phone]  If 7PM-7AM, please contact night-coverage [email or web address] Password [email or web address] 08/26/2012, 11:56 AM   LOS: 6 days

## 2012-08-26 NOTE — Progress Notes (Signed)
Trach care done by RT.  There is some mucous drainage from around trach.  No respiratory distress noted.

## 2012-08-27 DIAGNOSIS — I279 Pulmonary heart disease, unspecified: Secondary | ICD-10-CM

## 2012-08-27 LAB — BASIC METABOLIC PANEL
CO2: 26 mEq/L (ref 19–32)
Calcium: 8.7 mg/dL (ref 8.4–10.5)
Creatinine, Ser: 2.93 mg/dL — ABNORMAL HIGH (ref 0.50–1.10)
GFR calc non Af Amer: 15 mL/min — ABNORMAL LOW (ref >90)
Sodium: 136 mEq/L (ref 135–145)

## 2012-08-27 LAB — LEGIONELLA ANTIGEN, URINE

## 2012-08-27 MED ORDER — CARVEDILOL 12.5 MG PO TABS
12.5000 mg | ORAL_TABLET | Freq: Two times a day (BID) | ORAL | Status: DC
  Administered 2012-08-27 – 2012-09-14 (×31): 12.5 mg via ORAL
  Filled 2012-08-27 (×40): qty 1

## 2012-08-27 NOTE — Progress Notes (Signed)
[facility] PROGRESS NOTE  [name] AVW:[id number] DOB: 04-28-1940 DOA: 08/20/2012 PCP: Gwynneth Aliment, MD  Assessment/Plan: Acute on chronic respiratory failure  -This is likely multifactorial including acute on chronic combined CHF, cor pulmonale, COPD, mucus plugging, OSA, and deconditioning  -The patient currently does not have any respiratory distress, however feels SOB not significantly improved. Check CXR. Patient refusing ABG. Continue current treatments. -Will address the above issues as discussed below  [facility] status has improved since admission; currently stable on trach collar 28% FiO2 Acute on chronic combined CHF  -Clinically, the patient is improving.  -initial proBNP 10275 -Although her chest x-ray does not suggest significant pulmonary edema, I suspect that she mostly has right-sided CHF, cor pulmonale  -She had significant peripheral edema and JVD which have improved. -She has received one dose of intravenous furosemide in the emergency department patient; received an additional 40 mg IV evening of 08/20/2012  -Changed IV furosemide to oral lasix. -Echocardiogram on 07/13/2012 shows EF 45%, grade 2 diastolic dysfunction  COPD/cor pulmonale  -Does not appear to be decompensated although she does have some scattered wheezing  -Patient s/p one dose of intravenous Solu-Medrol  -Contiunue oral prednisone and weaned as tolerated  -Aerosolized albuterol and Atrovent as well as Brovana  -Aggressive pulmonary toilet  -Physical therapy Acute on chronic renal failure, CKD stage IV  -Monitor renal function--Cr was slowly rising yesterday but trending back down after switch to oral lasix. Decreased lasix dose to 40mg  BID. Follow. -Baseline creatinine 2.5-2.7 History of NSTEMI (07/08/2012 admission)  -Cycle troponins--neg -Continue aspirin, Plavix, statin  Hypertension  -Continue amlodipine, carvedilol, hydralazine, clonidine  Anemia  -Multifactorial  including anemia of chronic disease and iron deficiency  -Continue iron  GERD  -Continue PPI  Family Communication: Pt at beside  Disposition Plan: Home when medically stable        Procedures/Studies: Dg Chest 2 View  08/20/2012   *RADIOLOGY REPORT*  Clinical Data: Shortness of breath.  Hypertension.  Cardiomyopathy.  CHEST - 2 VIEW  Comparison: 07/29/2012  Findings: Tracheostomy appropriately positioned.  Moderate cardiomegaly with atherosclerosis in the transverse aorta.  No definite pleural fluid. No pneumothorax.  No congestive failure.  Mild right base volume loss and subsegmental atelectasis.  Slight improvement in left base aeration. Probable subsegmental atelectasis remaining.  Moderate left hemidiaphragm elevation.  IMPRESSION: Cardiomegaly and moderate left hemidiaphragm elevation.  Bibasilar atelectasis without convincing evidence of acute superimposed process.   Original Report Authenticated By: Jeronimo Greaves, M.D.   Dg Chest Port 1 View  07/29/2012   *RADIOLOGY REPORT*  Clinical Data: 72 year old female tracheostomy replacement.  PORTABLE CHEST - 1 VIEW  Comparison: 07/15/2012 and earlier.  Findings: AP portable semi upright view 1820 hours.  Tracheostomy tube projects in the midline and appears adequately positioned. Stable lung volumes. Stable cardiomegaly and mediastinal contours. Interval mildly decreased retrocardiac opacity with loss of definition of the left hemidiaphragm.  No pneumothorax or pulmonary edema.  No definite effusion.  IMPRESSION: 1.  Tracheostomy tube in place with no adverse features. 2.  Chronic cardiomegaly and mildly decreased retrocardiac ventilation.   Original Report Authenticated By: Erskine Speed, M.D.         Subjective: Patient still c/o some SOB, however starting to feel better. Patient stated had some chest pain early this morning.  Objective: Filed Vitals:   08/27/12 0421 08/27/12 0628 08/27/12 0720 08/27/12 0723  BP:  176/68    Pulse: 73  74  66  Temp:  98.6 F (37 C)  TempSrc:  Oral    Resp: 18 18  18   Height:      Weight:  79.924 kg (176 lb 3.2 oz)    SpO2: 96% 98% 95% 95%    Intake/Output Summary (Last 24 hours) at 08/27/12 1048 Last data filed at 08/27/12 0900  Gross per 24 hour  Intake    720 ml  Output   1101 ml  Net   -381 ml   Weight change: -1.996 kg (-4 lb 6.4 oz) Exam:   General:  Pt is alert, follows commands appropriately, not in acute distress  HEENT: No icterus, No thrush,  [place]/AT  Cardiovascular: RRR, S1/S2, no rubs, no gallops  Respiratory: Diminished breath sounds left base. min wheezes.  Abdomen: Soft/+BS, non tender, non distended, no guarding  Extremities: TRACE  edema, No lymphangitis, No petechiae, No rashes, no synovitis  Data Reviewed: Basic Metabolic Panel:  Recent Labs Lab 08/22/12 0444 08/23/12 0521 08/24/12 0510 08/25/12 0500 08/26/12 0611 08/27/12 0605  NA 138 140 137 138 139 136  K 4.3 4.2 4.2 4.2 4.1 4.1  CL 103 102 102 102 100 99  CO2 25 24 25 28 25 26   GLUCOSE 110* 90 84 99 103* 99  BUN 57* 65* 69* 69* 75* 79*  CREATININE 2.70* 2.95* 2.80* 2.81* 2.98* 2.93*  CALCIUM 8.8 8.9 8.5 8.9 8.8 8.7  MG 1.8  --   --   --   --   --    Liver Function Tests: No results found for this basename: AST, ALT, ALKPHOS, BILITOT, PROT, ALBUMIN,  in the last 168 hours No results found for this basename: LIPASE, AMYLASE,  in the last 168 hours No results found for this basename: AMMONIA,  in the last 168 hours CBC:  Recent Labs Lab 08/24/12 0510 08/25/12 0500 08/26/12 0611  WBC 10.5 10.9* 12.6*  NEUTROABS  --  8.0*  --   HGB 9.0* 9.0* 9.2*  HCT 29.0* 28.7* 29.4*  MCV 81.9 81.1 80.5  PLT 227 239 273   Cardiac Enzymes:  Recent Labs Lab 08/20/12 1107 08/20/12 1658 08/20/12 2212  TROPONINI <0.30 <0.30 <0.30   BNP: No components found with this basename: POCBNP,  CBG:  Recent Labs Lab 08/20/12 1111 08/20/12 1647  GLUCAP 100* 135*    No results found for  this or any previous visit (from the past 240 hour(s)).   Scheduled Meds: . ipratropium  0.5 mg Nebulization Q6H   And  . albuterol  2.5 mg Nebulization Q6H  . amLODipine  10 mg Oral Daily  . antiseptic oral rinse  15 mL Mouth Rinse Daily  . arformoterol  15 mcg Nebulization BID  . aspirin  81 mg Oral Daily  . atorvastatin  40 mg Oral Daily  . budesonide  0.25 mg Nebulization BID  . busPIRone  5 mg Oral BID  . carvedilol  12.5 mg Oral BID WC  . cloNIDine  0.1 mg Oral BID  . clopidogrel  75 mg Oral Q breakfast  . dextromethorphan-guaiFENesin  1 tablet Oral BID  . enoxaparin (LOVENOX) injection  30 mg Subcutaneous Q24H  . ferrous sulfate  325 mg Oral BID  . furosemide  40 mg Oral BID  . hydrALAZINE  100 mg Oral TID  . isosorbide mononitrate  30 mg Oral Daily  . metoCLOPramide  5 mg Oral QID  . pantoprazole  40 mg Oral Daily  . predniSONE  40 mg Oral QPC breakfast  . sertraline  100 mg Oral QHS  .  sodium chloride  3 mL Intravenous Q12H  . vitamin C  500 mg Oral Daily   Continuous Infusions:    THOMPSON,DANIEL, MD  [facility] Pager [phone]  If 7PM-7AM, please contact night-coverage [email or web address] Password [email or web address] 08/27/2012, 10:48 AM   LOS: 7 days

## 2012-08-27 NOTE — Progress Notes (Signed)
12 lead EKG completed as ordered and physician reviewed.  Respiratory  Exchange even unlabored.

## 2012-08-28 ENCOUNTER — Encounter (HOSPITAL_COMMUNITY): Payer: Self-pay | Admitting: Internal Medicine

## 2012-08-28 DIAGNOSIS — E669 Obesity, unspecified: Secondary | ICD-10-CM | POA: Diagnosis present

## 2012-08-28 HISTORY — DX: Obesity, unspecified: E66.9

## 2012-08-28 LAB — BASIC METABOLIC PANEL
BUN: 80 mg/dL — ABNORMAL HIGH (ref 6–23)
Calcium: 8.5 mg/dL (ref 8.4–10.5)
GFR calc Af Amer: 18 mL/min — ABNORMAL LOW (ref >90)
GFR calc non Af Amer: 16 mL/min — ABNORMAL LOW (ref >90)
Glucose, Bld: 100 mg/dL — ABNORMAL HIGH (ref 70–99)
Sodium: 137 mEq/L (ref 135–145)

## 2012-08-28 NOTE — Progress Notes (Signed)
[facility] PROGRESS NOTE  EMERLYN MEHLHOFF NWG:[id number] DOB: 1940/11/28 DOA: 08/20/2012 PCP: Gwynneth Aliment, MD  Assessment/Plan: Acute on chronic respiratory failure  -This is likely multifactorial including acute on chronic combined CHF, cor pulmonale, COPD, mucus plugging, OSA, and deconditioning  -The patient currently does not have any respiratory distress, however feels SOB not significantly improved. Check CXR. Patient refusing ABG. Continue current treatments. -Will address the above issues as discussed below  [facility] status has improved since admission; currently stable on trach collar 28% FiO2 Acute on chronic combined CHF  -Clinically, the patient is improving.  -initial proBNP 10275 -Although her chest x-ray does not suggest significant pulmonary edema, I suspect that she mostly has right-sided CHF, cor pulmonale  -She had significant peripheral edema and JVD which have improved. -She has received one dose of intravenous furosemide in the emergency department patient; received an additional 40 mg IV evening of 08/20/2012  -Changed IV furosemide to oral lasix. -Echocardiogram on 07/13/2012 shows EF 45%, grade 2 diastolic dysfunction  COPD/cor pulmonale  -Does not appear to be decompensated although she does have some scattered wheezing  -Patient s/p one dose of intravenous Solu-Medrol  -Contiunue oral prednisone and weaned as tolerated  -Aerosolized albuterol and Atrovent as well as Brovana  -Aggressive pulmonary toilet  -Physical therapy Acute on chronic renal failure, CKD stage IV  -Monitor renal function--Cr was slowly rising yesterday but trending back down after switch to oral lasix. Decreased lasix dose to 40mg  BID. Follow. -Baseline creatinine 2.5-2.7 History of NSTEMI (07/08/2012 admission)  -Cycle troponins--neg -Continue aspirin, Plavix, statin  Hypertension  -Continue amlodipine, carvedilol, hydralazine, clonidine  Anemia  -Multifactorial  including anemia of chronic disease and iron deficiency  -Continue iron  GERD  -Continue PPI  Family Communication: Pt at beside  Disposition Plan: Home when medically stable        Procedures/Studies: Dg Chest 2 View  08/20/2012   *RADIOLOGY REPORT*  Clinical Data: Shortness of breath.  Hypertension.  Cardiomyopathy.  CHEST - 2 VIEW  Comparison: 07/29/2012  Findings: Tracheostomy appropriately positioned.  Moderate cardiomegaly with atherosclerosis in the transverse aorta.  No definite pleural fluid. No pneumothorax.  No congestive failure.  Mild right base volume loss and subsegmental atelectasis.  Slight improvement in left base aeration. Probable subsegmental atelectasis remaining.  Moderate left hemidiaphragm elevation.  IMPRESSION: Cardiomegaly and moderate left hemidiaphragm elevation.  Bibasilar atelectasis without convincing evidence of acute superimposed process.   Original Report Authenticated By: Jeronimo Greaves, M.D.   Dg Chest Port 1 View  07/29/2012   *RADIOLOGY REPORT*  Clinical Data: 72 year old female tracheostomy replacement.  PORTABLE CHEST - 1 VIEW  Comparison: 07/15/2012 and earlier.  Findings: AP portable semi upright view 1820 hours.  Tracheostomy tube projects in the midline and appears adequately positioned. Stable lung volumes. Stable cardiomegaly and mediastinal contours. Interval mildly decreased retrocardiac opacity with loss of definition of the left hemidiaphragm.  No pneumothorax or pulmonary edema.  No definite effusion.  IMPRESSION: 1.  Tracheostomy tube in place with no adverse features. 2.  Chronic cardiomegaly and mildly decreased retrocardiac ventilation.   Original Report Authenticated By: Erskine Speed, M.D.         Subjective: Patient still c/o some SOB, however starting to feel better. Patient stated had some chest pain early this morning.  Objective: Filed Vitals:   08/28/12 0521 08/28/12 0838 08/28/12 0908 08/28/12 1149  BP:   142/83   Pulse: 62  58 58 60  Temp:   98.6 F (37  C)   TempSrc:   Oral   Resp: 18 16 17  60  Height:      Weight:      SpO2: 94% 94% 91% 94%    Intake/Output Summary (Last 24 hours) at 08/28/12 1357 Last data filed at 08/28/12 1200  Gross per 24 hour  Intake   1323 ml  Output   3351 ml  Net  -2028 ml   Weight change: 1.876 kg (4 lb 2.2 oz) Exam:   General:  Pt is alert, follows commands appropriately, not in acute distress  HEENT: No icterus, No thrush,  [place]/AT  Cardiovascular: RRR, S1/S2, no rubs, no gallops  Respiratory: Diminished breath sounds left base. min wheezes.  Abdomen: Soft/+BS, non tender, non distended, no guarding  Extremities: TRACE  edema, No lymphangitis, No petechiae, No rashes, no synovitis  Data Reviewed: Basic Metabolic Panel:  Recent Labs Lab 08/22/12 0444  08/24/12 0510 08/25/12 0500 08/26/12 0611 08/27/12 0605 08/28/12 0512  NA 138  < > 137 138 139 136 137  K 4.3  < > 4.2 4.2 4.1 4.1 4.1  CL 103  < > 102 102 100 99 99  CO2 25  < > 25 28 25 26 26   GLUCOSE 110*  < > 84 99 103* 99 100*  BUN 57*  < > 69* 69* 75* 79* 80*  CREATININE 2.70*  < > 2.80* 2.81* 2.98* 2.93* 2.83*  CALCIUM 8.8  < > 8.5 8.9 8.8 8.7 8.5  MG 1.8  --   --   --   --   --   --   < > = values in this interval not displayed. Liver Function Tests: No results found for this basename: AST, ALT, ALKPHOS, BILITOT, PROT, ALBUMIN,  in the last 168 hours No results found for this basename: LIPASE, AMYLASE,  in the last 168 hours No results found for this basename: AMMONIA,  in the last 168 hours CBC:  Recent Labs Lab 08/24/12 0510 08/25/12 0500 08/26/12 0611  WBC 10.5 10.9* 12.6*  NEUTROABS  --  8.0*  --   HGB 9.0* 9.0* 9.2*  HCT 29.0* 28.7* 29.4*  MCV 81.9 81.1 80.5  PLT 227 239 273   Cardiac Enzymes: No results found for this basename: CKTOTAL, CKMB, CKMBINDEX, TROPONINI,  in the last 168 hours BNP: No components found with this basename: POCBNP,  CBG: No results found for this  basename: GLUCAP,  in the last 168 hours  No results found for this or any previous visit (from the past 240 hour(s)).   Scheduled Meds: . ipratropium  0.5 mg Nebulization Q6H   And  . albuterol  2.5 mg Nebulization Q6H  . amLODipine  10 mg Oral Daily  . antiseptic oral rinse  15 mL Mouth Rinse Daily  . arformoterol  15 mcg Nebulization BID  . aspirin  81 mg Oral Daily  . atorvastatin  40 mg Oral Daily  . budesonide  0.25 mg Nebulization BID  . busPIRone  5 mg Oral BID  . carvedilol  12.5 mg Oral BID WC  . cloNIDine  0.1 mg Oral BID  . clopidogrel  75 mg Oral Q breakfast  . dextromethorphan-guaiFENesin  1 tablet Oral BID  . enoxaparin (LOVENOX) injection  30 mg Subcutaneous Q24H  . ferrous sulfate  325 mg Oral BID  . furosemide  40 mg Oral BID  . hydrALAZINE  100 mg Oral TID  . isosorbide mononitrate  30 mg Oral Daily  . metoCLOPramide  5 mg Oral QID  . pantoprazole  40 mg Oral Daily  . predniSONE  40 mg Oral QPC breakfast  . sertraline  100 mg Oral QHS  . sodium chloride  3 mL Intravenous Q12H  . vitamin C  500 mg Oral Daily   Continuous Infusions:    Alfreddie Consalvo, MD  [facility] Pager [phone]  If 7PM-7AM, please contact night-coverage [email or web address] Password [email or web address] 08/28/2012, 1:57 PM   LOS: 8 days

## 2012-08-28 NOTE — Progress Notes (Signed)
Upon tracheal suction of pt. Her trach is noted to need advancing.  Resistance met.  Charge RT called.  [name] is advanced without ease.  Positive color change via ETCO2 detector, BBS noted however trach appears to need advancing more. Pt. Stable. SPO2 95%, HR 75 and RR 20.  RN aware and RT will continue to monitor.

## 2012-08-28 NOTE — Progress Notes (Signed)
Dr. Janee Morn called to speak with pt's daughter, Romero Belling, to inform her he had heard back from pt's PCP re: pt's daughter and pt wanting form signed that pt has tracheostomy so they can give to [employer] for them to remove carpeting from home.  Dr. Janee Morn asked that Corrie Dandy be informed that he had given her cell # to PCP office staff so they can contact her, per her request to get form signed.  Pt and daughter given above information.

## 2012-08-28 NOTE — Progress Notes (Signed)
Pt trach looks "out" further than usual. RT will assess. Pt states that she is breathing fine and not SOB.

## 2012-08-28 NOTE — Progress Notes (Addendum)
Physical Therapy Treatment Patient Details Name: [name] MRN: [id number] DOB: 08-Jun-1940 Today's Date: 08/28/2012 Time: 4098-1191 PT Time Calculation (min): 10 min  PT Assessment / Plan / Recommendation  PT Comments   Pt cont's to consistently adamantly refuse to participate in PT session.  She does not give reason as to why she does not want to participate.   Increased time educating pt on reiterating importance of mobilizing to maintain strength, prevent deconditioning, & prevent possibilities of respiratory complications.  Pt states she understands but she cont's to refuse.  Due to pt refusing PT services 3x's in a row, we will now sign off.  MD will need to re-order if appropriate & if pt willing to participate in therapy.  RN made aware of pt refusing.                                        PT Goals (current goals can now be found in the care plan section)    Visit Information  Last PT Received On: 08/28/12                        Lara Mulch 08/28/2012, 10:18 AM  Verdell Face, PTA [phone] 08/28/2012

## 2012-08-28 NOTE — Progress Notes (Signed)
Nutrition Brief Note  Patient identified on the Malnutrition Screening Tool (MST) Report. Per MST, pt states that she has lost weight but is unsure of the amount. Per EPIC weights (see below), weight has been stable. Pt denies poor oral intake.  Wt Readings from Last 10 Encounters:  08/28/12 180 lb 5.4 oz (81.8 kg)  07/17/12 181 lb 7 oz (82.3 kg)  06/08/12 165 lb 12.6 oz (75.2 kg)  05/19/12 174 lb 13.2 oz (79.3 kg)  04/30/12 171 lb 8.3 oz (77.8 kg)  02/19/12 197 lb 8.5 oz (89.6 kg)  02/19/12 197 lb 8.5 oz (89.6 kg)  01/31/12 176 lb 6.4 oz (80.015 kg)  01/01/12 164 lb 3.9 oz (74.5 kg)  11/25/11 188 lb 6.4 oz (85.458 kg)    Body mass index is 30.94 kg/(m^2). Patient meets criteria for Obese Class I based on current BMI.   Current diet order is Heart Healthy, patient is consuming approximately 100% of meals at this time. Labs and medications reviewed.   No nutrition interventions warranted at this time. If nutrition issues arise, please consult RD.   Jarold Motto MS, RD, LDN Pager: [phone] After-hours pager: [phone]

## 2012-08-29 DIAGNOSIS — G4733 Obstructive sleep apnea (adult) (pediatric): Secondary | ICD-10-CM

## 2012-08-29 DIAGNOSIS — J9500 Unspecified tracheostomy complication: Secondary | ICD-10-CM

## 2012-08-29 DIAGNOSIS — J9610 Chronic respiratory failure, unspecified whether with hypoxia or hypercapnia: Secondary | ICD-10-CM

## 2012-08-29 LAB — BASIC METABOLIC PANEL
BUN: 90 mg/dL — ABNORMAL HIGH (ref 6–23)
Creatinine, Ser: 2.73 mg/dL — ABNORMAL HIGH (ref 0.50–1.10)
GFR calc Af Amer: 19 mL/min — ABNORMAL LOW (ref >90)
GFR calc non Af Amer: 16 mL/min — ABNORMAL LOW (ref >90)

## 2012-08-29 MED ORDER — BUDESONIDE 0.25 MG/2ML IN SUSP
0.2500 mg | Freq: Four times a day (QID) | RESPIRATORY_TRACT | Status: DC
  Administered 2012-08-29 – 2012-09-14 (×63): 0.25 mg via RESPIRATORY_TRACT
  Filled 2012-08-29 (×67): qty 2

## 2012-08-29 MED ORDER — ALBUTEROL SULFATE (5 MG/ML) 0.5% IN NEBU
2.5000 mg | INHALATION_SOLUTION | RESPIRATORY_TRACT | Status: DC | PRN
  Administered 2012-09-10 – 2012-09-13 (×2): 2.5 mg via RESPIRATORY_TRACT
  Filled 2012-08-29 (×2): qty 0.5

## 2012-08-29 MED ORDER — LORAZEPAM 0.5 MG PO TABS
0.2500 mg | ORAL_TABLET | Freq: Three times a day (TID) | ORAL | Status: DC | PRN
  Administered 2012-09-06 – 2012-09-12 (×4): 0.5 mg via ORAL
  Filled 2012-08-29 (×5): qty 1

## 2012-08-29 MED ORDER — PREDNISONE 20 MG PO TABS
20.0000 mg | ORAL_TABLET | Freq: Every day | ORAL | Status: DC
  Filled 2012-08-29 (×2): qty 1

## 2012-08-29 MED ORDER — BUDESONIDE 0.25 MG/2ML IN SUSP
0.2500 mg | Freq: Four times a day (QID) | RESPIRATORY_TRACT | Status: DC
  Filled 2012-08-29 (×3): qty 2

## 2012-08-29 NOTE — Progress Notes (Signed)
08/29/12 I spoke with Dr. Janee Morn about the pt trach. He took a look at it as I was doing trach care. He stated he was going to call CCM to come and take a look at it today. Pt is stable on 5l 35% trach collar. RT will continue to monitor.

## 2012-08-29 NOTE — Consult Note (Signed)
PULMONARY/CCM NOTE  Requesting MD/Service: Thompson/[facility] Date of admission: 6/29 Date of consult: 7/08 Reason for consultation: trach tube malposition  HPI:  31 F with chronic trach (since autumn of 2013) initially placed 11/23/11 by DF for recurrent  Intubation, initially for hypercarbic resp failure, then subsequently for VC edema. She was re-admitted 12/03 - 12/08 after trach tube dislodged and could not be replaced. She refused repeat trach tube placement @ that time. She was discharged to [facility] without replacement of the trach tube. She presented 12/23 to ED with PNA and while in ED suffered cardiac arrest for which she was again intubated during resuscitation. Dr Pollyann Kennedy performed repeat tracheostomy tube placement 02/14/12 and the tube has remained since that time. She was seen in ED 6/07 with dislodgement of trach tube and it was replaced and she was discharged back to SNF. She was admitted this hospitalization by [facility] with dyspnea attributed to multiple factors. On the morning of this consultation, the RT noted that the trach tube appeared malpositioned with some blood in the tube's lumen. PCCM is asked to evaluate. See my findings below. The tube could not be replaced as there was no discernible stoma tract. She denies dyspnea and chest pain.    Past Medical History  Diagnosis Date  . COPD (chronic obstructive pulmonary disease)     on home o2  . Chronic renal insufficiency   . CVA (cerebral infarction)     hx  . Hypertensive heart disease with congestive heart failure   . Chronic cor pulmonale   . Cardiomyopathy of undetermined type 09/07/2011  . Chronic kidney disease (CKD), stage IV (severe)   . Hyperlipdemia   . Obesity (BMI 30-39.9)   . Sleep apnea   . History of gout 09/07/2011  . Heart failure 7/13  . Shortness of breath   . Anemia   . Vascular disease   . Asthma   . Anginal pain   . Hypertension   . Type II or unspecified type diabetes mellitus without mention of  complication, not stated as uncontrolled   . Stroke   . GERD (gastroesophageal reflux disease)   . Arthritis   . Bundle branch block   . Thrombocytopenia   . Acute and chronic respiratory failure   . Obesity, unspecified 08/28/2012    MEDICATIONS: reviewed  History   Social History  . Marital Status: Widowed    Spouse Name: N/A    Number of Children: 1  . Years of Education: N/A   Occupational History  . Not on file.   Social History Main Topics  . Smoking status: Former Smoker -- 1.00 packs/day for 55 years    Types: Cigarettes    Quit date: 08/28/2011  . Smokeless tobacco: Never Used  . Alcohol Use: No  . Drug Use: No  . Sexually Active: No   Other Topics Concern  . Not on file   Social History Narrative   Widow,  lives alone    Family History  Problem Relation Age of Onset  . Heart attack Father   . Stroke Mother     CVA  . Coronary artery disease Daughter     ROS - as above. Otherwise N/C  Filed Vitals:   08/29/12 1018 08/29/12 1030 08/29/12 1422 08/29/12 1440  BP: 173/71 158/62  144/59  Pulse:  61  71  Temp:    98.5 F (36.9 C)  TempSrc:    Oral  Resp:    18  Height:  Weight:      SpO2:   100% 95%    EXAM:  Gen: Chronically ill appearing in NAD HEENT: [place]/AT, EOMI, PERRL Neck: trach stoma closed. Superficial component of stoma with scant purulent secretions. No air leak noted even with vigorous cough. Trachea can be palpated but no tracheal component of stoma can be discerned Lungs: clear without wheezes Cardiovascular: RRR s M Abdomen: soft, NT, NABS Musculoskeletal: chronic stasis changes, no edema Neuro: no focal deficits noted  DATA:     BMET    Component Value Date/Time   NA 136 08/29/2012 0510   K 4.7 08/29/2012 0510   CL 98 08/29/2012 0510   CO2 29 08/29/2012 0510   GLUCOSE 108* 08/29/2012 0510   BUN 90* 08/29/2012 0510   CREATININE 2.73* 08/29/2012 0510   CALCIUM 8.8 08/29/2012 0510   GFRNONAA 16* 08/29/2012 0510   GFRAA 19* 08/29/2012  0510    CBC    Component Value Date/Time   WBC 12.6* 08/26/2012 0611   RBC 3.65* 08/26/2012 0611   HGB 9.2* 08/26/2012 0611   HCT 29.4* 08/26/2012 0611   PLT 273 08/26/2012 0611   MCV 80.5 08/26/2012 0611   MCH 25.2* 08/26/2012 0611   MCHC 31.3 08/26/2012 0611   RDW 17.1* 08/26/2012 0611   LYMPHSABS 1.6 08/25/2012 0500   MONOABS 1.1* 08/25/2012 0500   EOSABS 0.0 08/25/2012 0500   BASOSABS 0.0 08/25/2012 0500    CXR: 7/3 CM with LLL ATX. No overt edema or infiltrate  IMPRESSION:   Tracheostomy complication (recurrent) - dislodged and stoma closure Chronic respiratory failure - multifactorial COPD, no active wheezing Chronic cor pulmonale Acute on chronic combined systolic and diastolic CHF (congestive heart failure) Documented history of OSA - she is unable to tell me when or how this was diagnosed CKD Adult failure to thrive due to multiple comorbidities   DISC: For now she is tolerating decannulation but she has been down this path before. In general, she is a poor candidate for any high level intervention and we should be focusing on optimizing her QOL at this point, I would think. She has refused replacement of trach tube in past and this would be a medically reasonable decision if she again refused replacement. However, if she were to do so, I think that we need to establish NCB and provide dignified EOL care. Given her prior history, it is likely that she will require repeat trach tube placement in the future unless we establish limitations such as DNR/DNI. For now, however, there is no urgent need to replace it.   PLAN/REC:  -Leave trach tube out for now -Topical care to stoma site per routine -O2 by nasal cannula -We will try to clarify how/when the dx of OSA was made and decide on rx if needed -Suggest Palliative Care consult to address EOL issues with patient to include discussion of repeat trach tube placement -I have clarified BD regimen - she was on 2 different beta agonists on a  scheduled basis. I have discontinued aformoterol while on scheduled albuterol   Billy Fischer, MD ; [facility] [phone].  After 5:30 PM or weekends, call [phone]

## 2012-08-29 NOTE — Progress Notes (Signed)
Changed oxygen settings from 28% to 35% due to pts oxygen level decreasing. RN aware and pt tolerating well. Will continue to monitor.

## 2012-08-29 NOTE — Progress Notes (Signed)
08/29/12 pt trach sticks out more than normal. Blood is coming out of trach the patient stated she was SOB. RT reported to the day shift nurse (Amor) to contact pt's Doctor. RT will continue to monitor.

## 2012-08-29 NOTE — Progress Notes (Signed)
RT from last shift stated trach was further out and that RN was aware and that MD was aware.

## 2012-08-29 NOTE — Progress Notes (Signed)
08/29/12 1000  Dr. Sharol Harness pulled out the trach to reposition after pulling it out he could not get it back.. The patient stoma had closed up. I placed the patient on [place] 2L per MD orders. Pt is on a continuous pulse Ox. and stable. I place dressing over the stoma. RT will continue to monitor

## 2012-08-29 NOTE — Progress Notes (Signed)
[facility] PROGRESS NOTE  [name] WUJ:[id number] DOB: 1940-09-05 DOA: 08/20/2012 PCP: Gwynneth Aliment, MD  Assessment/Plan: Acute on chronic respiratory failure  -This is likely multifactorial including acute on chronic combined CHF, cor pulmonale, COPD, mucus plugging, OSA, and deconditioning  -The patient currently does not have any respiratory distress, however feels SOB not significantly improved. Check CXR. Patient refusing ABG. Continue current treatments. -Will address the above issues as discussed below  [facility] status has improved since admission; currently stable on trach collar 28% FiO2 Acute on chronic combined CHF  -Compensated.  -initial proBNP 10275 -Although her chest x-ray does not suggest significant pulmonary edema, I suspect that she mostly has right-sided CHF, cor pulmonale  -She had significant peripheral edema and JVD which have improved. -She had received one dose of intravenous furosemide in the emergency department patient; received an additional 40 mg IV evening of 08/20/2012  -Changed IV furosemide to oral lasix. -Echocardiogram on 07/13/2012 shows EF 45%, grade 2 diastolic dysfunction  COPD/cor pulmonale  -Does not appear to be decompensated although she does have some scattered wheezing  -Patient s/p one dose of intravenous Solu-Medrol  -Contiunue oral prednisone and weaned as tolerated  -Aerosolized albuterol and Atrovent as well as Brovana  -Aggressive pulmonary toilet  -Physical therapy Acute on chronic renal failure, CKD stage IV  -Monitor renal function--Cr was slowly rising yesterday but trending back down after switch to oral lasix. Decreased lasix dose to 40mg  BID. Follow. -Baseline creatinine 2.5-2.7 History of NSTEMI (07/08/2012 admission)  -Cycle troponins--neg -Continue aspirin, Plavix, statin  Hypertension  -Continue amlodipine, carvedilol, hydralazine, clonidine  Anemia  -Multifactorial including anemia of chronic  disease and iron deficiency  -Continue iron  Trach malposition CCM have consulted on patient and trach removed as stoma closed up on trying to reposition. Sats okay on 2L [place]. Monitor. GERD  -Continue PPI  Family Communication: Pt at beside  Disposition Plan: Home when medically stable hopefully tomorrow.        Procedures/Studies: Dg Chest 2 View  08/20/2012   *RADIOLOGY REPORT*  Clinical Data: Shortness of breath.  Hypertension.  Cardiomyopathy.  CHEST - 2 VIEW  Comparison: 07/29/2012  Findings: Tracheostomy appropriately positioned.  Moderate cardiomegaly with atherosclerosis in the transverse aorta.  No definite pleural fluid. No pneumothorax.  No congestive failure.  Mild right base volume loss and subsegmental atelectasis.  Slight improvement in left base aeration. Probable subsegmental atelectasis remaining.  Moderate left hemidiaphragm elevation.  IMPRESSION: Cardiomegaly and moderate left hemidiaphragm elevation.  Bibasilar atelectasis without convincing evidence of acute superimposed process.   Original Report Authenticated By: Jeronimo Greaves, M.D.   Dg Chest Port 1 View  07/29/2012   *RADIOLOGY REPORT*  Clinical Data: 72 year old female tracheostomy replacement.  PORTABLE CHEST - 1 VIEW  Comparison: 07/15/2012 and earlier.  Findings: AP portable semi upright view 1820 hours.  Tracheostomy tube projects in the midline and appears adequately positioned. Stable lung volumes. Stable cardiomegaly and mediastinal contours. Interval mildly decreased retrocardiac opacity with loss of definition of the left hemidiaphragm.  No pneumothorax or pulmonary edema.  No definite effusion.  IMPRESSION: 1.  Tracheostomy tube in place with no adverse features. 2.  Chronic cardiomegaly and mildly decreased retrocardiac ventilation.   Original Report Authenticated By: Erskine Speed, M.D.         Subjective: Patient still c/o some SOB, however starting to feel better. Patient stated has blood around trach  site and trach not in position.  Objective: Filed Vitals:   08/29/12 1018  08/29/12 1030 08/29/12 1422 08/29/12 1440  BP: 173/71 158/62  144/59  Pulse:  61  71  Temp:    98.5 F (36.9 C)  TempSrc:    Oral  Resp:    18  Height:      Weight:      SpO2:   100% 95%    Intake/Output Summary (Last 24 hours) at 08/29/12 1635 Last data filed at 08/29/12 1633  Gross per 24 hour  Intake   1563 ml  Output   2325 ml  Net   -762 ml   Weight change: -0.334 kg (-11.8 oz) Exam:   General:  Pt is alert, follows commands appropriately, not in acute distress  HEENT: No icterus, No thrush,  [place]/AT. Trach cuff out with blood around trach site.  Cardiovascular: RRR, S1/S2, no rubs, no gallops  Respiratory: Diminished breath sounds left base. min wheezes.  Abdomen: Soft/+BS, non tender, non distended, no guarding  Extremities: No  edema, No lymphangitis, No petechiae, No rashes, no synovitis  Data Reviewed: Basic Metabolic Panel:  Recent Labs Lab 08/25/12 0500 08/26/12 0611 08/27/12 0605 08/28/12 0512 08/29/12 0510  NA 138 139 136 137 136  K 4.2 4.1 4.1 4.1 4.7  CL 102 100 99 99 98  CO2 28 25 26 26 29   GLUCOSE 99 103* 99 100* 108*  BUN 69* 75* 79* 80* 90*  CREATININE 2.81* 2.98* 2.93* 2.83* 2.73*  CALCIUM 8.9 8.8 8.7 8.5 8.8   Liver Function Tests: No results found for this basename: AST, ALT, ALKPHOS, BILITOT, PROT, ALBUMIN,  in the last 168 hours No results found for this basename: LIPASE, AMYLASE,  in the last 168 hours No results found for this basename: AMMONIA,  in the last 168 hours CBC:  Recent Labs Lab 08/24/12 0510 08/25/12 0500 08/26/12 0611  WBC 10.5 10.9* 12.6*  NEUTROABS  --  8.0*  --   HGB 9.0* 9.0* 9.2*  HCT 29.0* 28.7* 29.4*  MCV 81.9 81.1 80.5  PLT 227 239 273   Cardiac Enzymes: No results found for this basename: CKTOTAL, CKMB, CKMBINDEX, TROPONINI,  in the last 168 hours BNP: No components found with this basename: POCBNP,  CBG: No results  found for this basename: GLUCAP,  in the last 168 hours  No results found for this or any previous visit (from the past 240 hour(s)).   Scheduled Meds: . ipratropium  0.5 mg Nebulization Q6H   And  . albuterol  2.5 mg Nebulization Q6H  . amLODipine  10 mg Oral Daily  . antiseptic oral rinse  15 mL Mouth Rinse Daily  . aspirin  81 mg Oral Daily  . atorvastatin  40 mg Oral Daily  . budesonide  0.25 mg Nebulization Q6H  . busPIRone  5 mg Oral BID  . carvedilol  12.5 mg Oral BID WC  . cloNIDine  0.1 mg Oral BID  . clopidogrel  75 mg Oral Q breakfast  . dextromethorphan-guaiFENesin  1 tablet Oral BID  . enoxaparin (LOVENOX) injection  30 mg Subcutaneous Q24H  . ferrous sulfate  325 mg Oral BID  . furosemide  40 mg Oral BID  . hydrALAZINE  100 mg Oral TID  . isosorbide mononitrate  30 mg Oral Daily  . metoCLOPramide  5 mg Oral QID  . pantoprazole  40 mg Oral Daily  . sertraline  100 mg Oral QHS  . sodium chloride  3 mL Intravenous Q12H  . vitamin C  500 mg Oral Daily  Continuous Infusions:    Ramiro Harvest, MD  [facility] Pager [phone]  If 7PM-7AM, please contact night-coverage [email or web address] Password [facility] 08/29/2012, 4:35 PM   LOS: 9 days

## 2012-08-29 NOTE — Progress Notes (Signed)
Physical Therapy Treatment Patient Details Name: [name] MRN: [id number] DOB: 1940-10-17 Today's Date: 08/28/2012 Time: 7829-5621 PT Time Calculation (min): 10 min  PT Assessment / Plan / Recommendation  PT Comments   Pt cont's to consistently adamantly refuse to participate in PT session.  She does not give reason as to why she does not want to participate.   Increased time educating pt on reiterating importance of mobilizing to maintain strength, prevent deconditioning, & prevent possibilities of respiratory complications.  Pt states she understands but she cont's to refuse.  Due to pt refusing PT services 3x's in a row, we will now sign off.  MD will need to re-order if appropriate & if pt willing to participate in therapy.  RN made aware of pt refusing.                                        PT Goals (current goals can now be found in the care plan section)    Visit Information  Last PT Received On: 08/28/12                        Lara Mulch 08/28/2012, 10:18 AM  Verdell Face, PTA [phone] 08/28/2012  Agree with above assessment.  Lewis Shock, PT, DPT Pager #: [phone] Office #: [phone]

## 2012-08-30 DIAGNOSIS — R5381 Other malaise: Secondary | ICD-10-CM

## 2012-08-30 DIAGNOSIS — N179 Acute kidney failure, unspecified: Secondary | ICD-10-CM

## 2012-08-30 DIAGNOSIS — N189 Chronic kidney disease, unspecified: Secondary | ICD-10-CM

## 2012-08-30 DIAGNOSIS — R531 Weakness: Secondary | ICD-10-CM

## 2012-08-30 DIAGNOSIS — Z515 Encounter for palliative care: Secondary | ICD-10-CM

## 2012-08-30 LAB — BASIC METABOLIC PANEL
Calcium: 8.8 mg/dL (ref 8.4–10.5)
Creatinine, Ser: 3.13 mg/dL — ABNORMAL HIGH (ref 0.50–1.10)
GFR calc Af Amer: 16 mL/min — ABNORMAL LOW (ref >90)
GFR calc non Af Amer: 14 mL/min — ABNORMAL LOW (ref >90)
Sodium: 137 mEq/L (ref 135–145)

## 2012-08-30 MED ORDER — FUROSEMIDE 40 MG PO TABS
40.0000 mg | ORAL_TABLET | Freq: Two times a day (BID) | ORAL | Status: DC
  Filled 2012-08-30: qty 1

## 2012-08-30 NOTE — Progress Notes (Signed)
PULMONARY/CCM NOTE  Requesting MD/Service: Thompson/[facility] Date of admission: 6/29 Date of consult: 7/08 Reason for consultation: trach tube malposition  HPI:  24 F with chronic trach (since autumn of 2013) initially placed 11/23/11 by DF for recurrent  Intubation, initially for hypercarbic resp failure, then subsequently for VC edema. She was re-admitted 12/03 - 12/08 after trach tube dislodged and could not be replaced. She refused repeat trach tube placement @ that time. She was discharged to [facility] without replacement of the trach tube. She presented 12/23 to ED with PNA and while in ED suffered cardiac arrest for which she was again intubated during resuscitation. Dr Pollyann Kennedy performed repeat tracheostomy tube placement 02/14/12 and the tube has remained since that time. She was seen in ED 6/07 with dislodgement of trach tube and it was replaced and she was discharged back to SNF. She was admitted this hospitalization by [facility] with dyspnea attributed to multiple factors. On the morning of this consultation, the RT noted that the trach tube appeared malpositioned with some blood in the tube's lumen. PCCM is asked to evaluate. See my findings below. The tube could not be replaced as there was no discernible stoma tract. She denies dyspnea and chest pain.  SUBJ - breathing ok, no cough, CP  Filed Vitals:   08/30/12 0743 08/30/12 0912 08/30/12 1202 08/30/12 1300  BP:  138/47 163/50 148/44  Pulse:  66 64 63  Temp:    98.2 F (36.8 C)  TempSrc:    Oral  Resp:    18  Height:      Weight:      SpO2: 100%   100%    EXAM:  Gen: Chronically ill appearing in NAD HEENT: St[place]/AT, EOMI, PERRL Neck: trach stoma closed. Superficial component of stoma with scant purulent secretions. No air leak noted even with vigorous cough. Trachea can be palpated but no tracheal component of stoma can be discerned Lungs: clear without wheezes Cardiovascular: RRR s M Abdomen: soft, NT, NABS Musculoskeletal: chronic stasis  changes, no edema Neuro: no focal deficits noted  DATA:     BMET    Component Value Date/Time   NA 137 08/30/2012 0645   K 5.1 08/30/2012 0645   CL 99 08/30/2012 0645   CO2 29 08/30/2012 0645   GLUCOSE 133* 08/30/2012 0645   BUN 91* 08/30/2012 0645   CREATININE 3.13* 08/30/2012 0645   CALCIUM 8.8 08/30/2012 0645   GFRNONAA 14* 08/30/2012 0645   GFRAA 16* 08/30/2012 0645    CBC    Component Value Date/Time   WBC 12.6* 08/26/2012 0611   RBC 3.65* 08/26/2012 0611   HGB 9.2* 08/26/2012 0611   HCT 29.4* 08/26/2012 0611   PLT 273 08/26/2012 0611   MCV 80.5 08/26/2012 0611   MCH 25.2* 08/26/2012 0611   MCHC 31.3 08/26/2012 0611   RDW 17.1* 08/26/2012 0611   LYMPHSABS 1.6 08/25/2012 0500   MONOABS 1.1* 08/25/2012 0500   EOSABS 0.0 08/25/2012 0500   BASOSABS 0.0 08/25/2012 0500    CXR: 7/3 CM with LLL ATX. No overt edema or infiltrate  IMPRESSION:   Tracheostomy complication (recurrent) - dislodged and stoma closure Chronic respiratory failure - multifactorial COPD, no active wheezing Chronic cor pulmonale Acute on chronic combined systolic and diastolic CHF (congestive heart failure) Documented history of OSA - she is unable to tell me when or how this was diagnosed CKD Adult failure to thrive due to multiple comorbidities   DISC: For now she is tolerating decannulation but she  has been down this path before. In general, she is a poor candidate for any high level intervention and we should be focusing on optimizing her QOL at this point. Given her prior history, it is likely that she will require repeat trach tube placement in the future unless we establish limitations such as DNR/DNI. For now, however, there is no urgent need to replace it. She has severe OSA - I discussed 2 options with her & daughter [name] - either replace trach electively or leave it out for now & use CPAP qhs - definitely some  risk for acute resp failure/arrest with second option  PLAN/REC:  -Leave trach tube out for now -Topical care to  stoma site per routine -O2 by nasal cannula- -[facility] consult to address EOL issues with patient to include discussion of repeat trach tube placement -clarified BD regimen - she was on 2 different beta agonists on a scheduled basis.,discontinued aformoterol while on scheduled albuterol  Cyril Mourning MD. FCCP. [facility] Pager [phone] If no response call [phone]

## 2012-08-30 NOTE — Consult Note (Signed)
Patient [name]      DOB: 1940/04/03      QIO:[id number]     Consult Note from the Palliative Medicine Team at [facility]    Consult Requested by: Dr Suanne Marker     PCP: Gwynneth Aliment, MD Reason for Consultation:Clarification of GOC and options     Phone Number:[phone]  Assessment of patients Current state: Multiple co-morbidities including acute/chronic respiratory failure with trach for airway protection/ recent de cannulization, COPD,CHF, CKD and overall failure to thrive. Faced with decisions regarding advanced directives and specifically how they relate to elective trach replacement  Consult is for review of medical treatment options, clarification of goals of care and end of life issues, disposition and options, and symptom recommendation.  This NP Lorinda Creed reviewed medical records, received report from team, assessed the patient and then meet at the patient's bedside along with her daughter [name]  to discuss diagnosis prognosis, GOC, EOL wishes disposition and options.   A detailed discussion was had today regarding advanced directives.  Concepts specific to code status, artifical feeding and hydration, re cannulization of trach and anticipated respiratory failure rehospitalization was had.   The difference between a aggressive medical intervention path  and a palliative comfort care path for this patient at this time was had.  Values and goals of care important to patient and family were attempted to be elicited.  Concept of Hospice and Palliative Care were discussed  Natural trajectory and expectations at EOL were discussed.  Questions and concerns addressed.  Family encouraged to call with questions or concerns.  PMT will continue to support holistically.   Goals of Care: 1.  Code Status: Full code   2. Scope of Treatment: 1.  At this time continue all available medical interventions to teat the treatable and extend life.  Patient and daughter are  open to continued discussion with this NP regarding advanced directives.   Will f/u in morning  4. Disposition: Home when medically stable with previous care givers   3. Symptom Management:   1. Weakness:  encouraged participation with PT and self exercises  4. Psychosocial:  Emotional support offered to patient and daughter at bedside, this is very difficult for patient but her daughter was able to verbalize her support what ever the decisions made. Daughter herself is a dialysis patient secondary to Lupus   Brief HPI:71 F with chronic trach (since autumn of 2013) initially placed 11/23/11 by DF for recurrent Intubation, initially for hypercarbic resp failure, then subsequently for VC edema. She was re-admitted 12/03 - 12/08 after trach tube dislodged and could not be replaced. She refused repeat trach tube placement @ that time. She was discharged to [facility] without replacement of the trach tube. She presented 12/23 to ED with PNA and while in ED suffered cardiac arrest for which she was again intubated during resuscitation. Dr Pollyann Kennedy performed repeat tracheostomy tube placement 02/14/12 and the tube has remained since that time. She was seen in ED 6/07 with dislodgement of trach tube and it was replaced and she was discharged back to SNF.    ROS: sob on minimal exertion, weakness    PMH:  Past Medical History  Diagnosis Date  . COPD (chronic obstructive pulmonary disease)     on home o2  . Chronic renal insufficiency   . CVA (cerebral infarction)     hx  . Hypertensive heart disease with congestive heart failure   . Chronic cor pulmonale   . Cardiomyopathy of undetermined  type 09/07/2011  . Chronic kidney disease (CKD), stage IV (severe)   . Hyperlipdemia   . Obesity (BMI 30-39.9)   . Sleep apnea   . History of gout 09/07/2011  . Heart failure 7/13  . Shortness of breath   . Anemia   . Vascular disease   . Asthma   . Anginal pain   . Hypertension   . Type II or unspecified  type diabetes mellitus without mention of complication, not stated as uncontrolled   . Stroke   . GERD (gastroesophageal reflux disease)   . Arthritis   . Bundle branch block   . Thrombocytopenia   . Acute and chronic respiratory failure   . Obesity, unspecified 08/28/2012     PSH: Past Surgical History  Procedure Laterality Date  . Bilateral oophorectomy    . Cardiac catheterization      right heart cath  . Laparoscopic gastrostomy  12/21/2011    Procedure: LAPAROSCOPIC GASTROSTOMY;  Surgeon: Axel Filler, MD;  Location: [facility] OR;  Service: General;  Laterality: N/A;  laparoscopic gastrostomy possible open feeding tube  . Abdominal hysterectomy    . Tracheostomy  11/2011  . Cataracts    . Tracheostomy tube placement  02/18/2012    Procedure: TRACHEOSTOMY;  Surgeon: Serena Colonel, MD;  Location: [facility] OR;  Service: ENT;  Laterality: N/A;   I have reviewed the FH and SH and  If appropriate update it with new information. Allergies  Allergen Reactions  . Other Itching    "Wool"  . Sulfonamide Derivatives Hives and Rash   Scheduled Meds: . ipratropium  0.5 mg Nebulization Q6H   And  . albuterol  2.5 mg Nebulization Q6H  . amLODipine  10 mg Oral Daily  . antiseptic oral rinse  15 mL Mouth Rinse Daily  . aspirin  81 mg Oral Daily  . atorvastatin  40 mg Oral Daily  . budesonide  0.25 mg Nebulization Q6H  . busPIRone  5 mg Oral BID  . carvedilol  12.5 mg Oral BID WC  . cloNIDine  0.1 mg Oral BID  . clopidogrel  75 mg Oral Q breakfast  . dextromethorphan-guaiFENesin  1 tablet Oral BID  . enoxaparin (LOVENOX) injection  30 mg Subcutaneous Q24H  . ferrous sulfate  325 mg Oral BID  . [START ON 08/31/2012] furosemide  40 mg Oral BID  . hydrALAZINE  100 mg Oral TID  . isosorbide mononitrate  30 mg Oral Daily  . metoCLOPramide  5 mg Oral QID  . pantoprazole  40 mg Oral Daily  . sertraline  100 mg Oral QHS  . sodium chloride  3 mL Intravenous Q12H  . vitamin C  500 mg Oral Daily    Continuous Infusions:  PRN Meds:.acetaminophen, albuterol, LORazepam, ondansetron (ZOFRAN) IV, ondansetron, oxyCODONE    BP 148/44  Pulse 63  Temp(Src) 98.2 F (36.8 C) (Oral)  Resp 18  Ht 5\' 4"  (1.626 m)  Wt 82.3 kg (181 lb 7 oz)  BMI 31.13 kg/m2  SpO2 100%   PPS:50 %   Intake/Output Summary (Last 24 hours) at 08/30/12 1526 Last data filed at 08/30/12 0912  Gross per 24 hour  Intake    963 ml  Output    850 ml  Net    113 ml    Physical Exam:  General: chronically ill appearing, NAD, sitting on side of bed HEENT: mm, no exudate, noted dressing over trach site Chest:   CTA CVS: RRR no murmur noted Abdomen:soft, obese +BS,  NT Ext: BLE trace edema Neuro:alert and oriented X3  Labs: CBC    Component Value Date/Time   WBC 12.6* 08/26/2012 0611   RBC 3.65* 08/26/2012 0611   HGB 9.2* 08/26/2012 0611   HCT 29.4* 08/26/2012 0611   PLT 273 08/26/2012 0611   MCV 80.5 08/26/2012 0611   MCH 25.2* 08/26/2012 0611   MCHC 31.3 08/26/2012 0611   RDW 17.1* 08/26/2012 0611   LYMPHSABS 1.6 08/25/2012 0500   MONOABS 1.1* 08/25/2012 0500   EOSABS 0.0 08/25/2012 0500   BASOSABS 0.0 08/25/2012 0500    BMET    Component Value Date/Time   NA 137 08/30/2012 0645   K 5.1 08/30/2012 0645   CL 99 08/30/2012 0645   CO2 29 08/30/2012 0645   GLUCOSE 133* 08/30/2012 0645   BUN 91* 08/30/2012 0645   CREATININE 3.13* 08/30/2012 0645   CALCIUM 8.8 08/30/2012 0645   GFRNONAA 14* 08/30/2012 0645   GFRAA 16* 08/30/2012 0645    CMP     Component Value Date/Time   NA 137 08/30/2012 0645   K 5.1 08/30/2012 0645   CL 99 08/30/2012 0645   CO2 29 08/30/2012 0645   GLUCOSE 133* 08/30/2012 0645   BUN 91* 08/30/2012 0645   CREATININE 3.13* 08/30/2012 0645   CALCIUM 8.8 08/30/2012 0645   PROT 5.7* 07/09/2012 0615   ALBUMIN 2.7* 07/09/2012 0615   AST 30 07/09/2012 0615   ALT 7 07/09/2012 0615   ALKPHOS 66 07/09/2012 0615   BILITOT 0.2* 07/09/2012 0615   GFRNONAA 14* 08/30/2012 0645   GFRAA 16* 08/30/2012 0645    Time In Time Out Total Time  Spent with Patient Total Overall Time  1400 1530 80 min 90 min    Greater than 50%  of this time was spent counseling and coordinating care related to the above assessment and plan.  Lorinda Creed NP  Palliative Medicine Team Team Phone # [phone] Pager [phone]  Discussed with Dr Sung Amabile

## 2012-08-30 NOTE — Progress Notes (Signed)
Thank you for consulting the Palliative Medicine Team at [facility] to meet your patient's and family's needs.   The reason that you asked [place] to see your patient is for goals of care discussion  We have scheduled your patient for a meeting:  Unable to schedule - patient would like for daughter [name] to be present for discussion-am awaiting call back from daughter   The Surrogate decision maker is: patient reports her daughter helps with her decisions Contact information: [place] [phone]  Other family members that need to be present: patient's daughter Sunday Corn- and per patient her daughter will inform other family  Your patient is able/unable to participate: patient able to participate  Additional Narrative:  On this visit patient alert, observed taking morning pill without difficulty; patient made minimal eye contact during visit affect flat when asked if she was feeling down paitne sighed, asked if she would like for the PMT CSW to come by to talk/emotional support she said it would be'ok'; will ask Suzette Battiest Lett PMT SW to also see patient; patient aware PMT is waiting on call back from daughter Seward Grater, RN 08/30/2012, 9:42 AM Palliative Medicine Team RN Liaison [phone]

## 2012-08-30 NOTE — Progress Notes (Signed)
Patient is refusing CPAP at this time. She states that she does not want to wear because she is afraid she cannot take off to go to the bathroom. I explained that she can call the nurse or respiratory to assist her with this. She does not want to try it at this time. I will reassess in an hour to see if she will try to wear.

## 2012-08-30 NOTE — Progress Notes (Signed)
Palliative Medicine Team SW Pt discussed in PMT rounds, still awaiting call back from dtr to schedule GOC. Came by to introduce myself and role for psychosocial support. Met with pt alone at bedside, no family present. Pt appreciative of visit, but reports difficulty speaking at length due to shortness of breath. Pt denied acute emotional distress at the moment and reports dtr has not been to hospital yet today. Will continue to follow for support to pt and family.   [name], LCSWA PMT phone [phone] Pager [phone]

## 2012-08-30 NOTE — Progress Notes (Signed)
[facility] PROGRESS NOTE  [name] KGM:[id number] DOB: 12-Feb-1941 DOA: 08/20/2012 PCP: Gwynneth Aliment, MD  Assessment/Plan: Acute on chronic respiratory failure  -This is likely multifactorial including acute on chronic combined CHF, cor pulmonale, COPD, mucus plugging, OSA, and deconditioning  -The patient currently does not have any respiratory distress, however feels SOB not significantly improved. Check CXR. Patient refused ABG. Continue current treatments. -see above issues as discussed below  -appreciate pulmonology assistance- trach was dc'ed yesterday 7/8 and palliative care consulted- meeting planned for today, but daughter wants trach reinserted- I have discussed this with Dr Sung Amabile today 7/9 and he will talk with pt's daughter as well -will await outcome of palliative care meeting Acute on chronic combined CHF  -Compensated.  -initial proBNP 10275 -Although her chest x-ray does not suggest significant pulmonary edema, I suspect that she mostly has right-sided CHF, cor pulmonale  -She had significant peripheral edema and JVD which have improved. -She had received one dose of intravenous furosemide in the emergency department patient; received an additional 40 mg IV evening of 08/20/2012  -Echocardiogram on 07/13/2012 shows EF 45%, grade 2 diastolic dysfunction  -cr continuing to trend up today- will hold lasix follow resume in am COPD/cor pulmonale  -Does not appear to be decompensated although she does have some scattered wheezing  -Patient s/p one dose of intravenous Solu-Medrol on 6/29, and prednisone was tapered off 7/8 - continue pulmicort, appreciate pulm assistance Brovana dc'ed 7/8 as pt on scheduled neb bronchodilators -continue Aggressive pulmonary toilet  -Physical therapy Acute on chronic renal failure, CKD stage IV  -Monitor renal function--Cr continuing to trend up today 7/9- 3.1, hold lasix for today, follow and recheck -Baseline creatinine  2.5-2.7 History of NSTEMI (07/08/2012 admission)  -Cycle troponins--neg -Continue aspirin, Plavix, statin  Hypertension  -Continue amlodipine, carvedilol, hydralazine, clonidine  Anemia  -Multifactorial including anemia of chronic disease and iron deficiency  -Continue iron  Trach malposition Trach decannulated 7/9 after it was found almost out - palliative care consulted for EOL -discussed daughter [name]'s wishes to have it reinserted with Dr Sung Amabile- Pulm to follow for further recs  GERD  -Continue PPI  Family Communication: Pt at beside  Disposition Plan: Home when medically stable hopefully tomorrow.        Procedures/Studies: Dg Chest 2 View  08/20/2012   *RADIOLOGY REPORT*  Clinical Data: Shortness of breath.  Hypertension.  Cardiomyopathy.  CHEST - 2 VIEW  Comparison: 07/29/2012  Findings: Tracheostomy appropriately positioned.  Moderate cardiomegaly with atherosclerosis in the transverse aorta.  No definite pleural fluid. No pneumothorax.  No congestive failure.  Mild right base volume loss and subsegmental atelectasis.  Slight improvement in left base aeration. Probable subsegmental atelectasis remaining.  Moderate left hemidiaphragm elevation.  IMPRESSION: Cardiomegaly and moderate left hemidiaphragm elevation.  Bibasilar atelectasis without convincing evidence of acute superimposed process.   Original Report Authenticated By: Jeronimo Greaves, M.D.   Dg Chest Port 1 View  07/29/2012   *RADIOLOGY REPORT*  Clinical Data: 72 year old female tracheostomy replacement.  PORTABLE CHEST - 1 VIEW  Comparison: 07/15/2012 and earlier.  Findings: AP portable semi upright view 1820 hours.  Tracheostomy tube projects in the midline and appears adequately positioned. Stable lung volumes. Stable cardiomegaly and mediastinal contours. Interval mildly decreased retrocardiac opacity with loss of definition of the left hemidiaphragm.  No pneumothorax or pulmonary edema.  No definite effusion.   IMPRESSION: 1.  Tracheostomy tube in place with no adverse features. 2.  Chronic cardiomegaly and mildly decreased retrocardiac ventilation.  Original Report Authenticated By: Erskine Speed, M.D.         Subjective: states breathing has been on and off with trach removed, denies pain. Per nsg pt's daughter called last pm regarding trach and wants to have it reinserted.  Objective: Filed Vitals:   08/30/12 0320 08/30/12 0513 08/30/12 0743 08/30/12 0912  BP:  153/53  138/47  Pulse:  75  66  Temp:  98.3 F (36.8 C)    TempSrc:  Oral    Resp:  18    Height:      Weight:  82.3 kg (181 lb 7 oz)    SpO2: 100% 100% 100%     Intake/Output Summary (Last 24 hours) at 08/30/12 1025 Last data filed at 08/30/12 0912  Gross per 24 hour  Intake   1443 ml  Output   1375 ml  Net     68 ml   Weight change: 0.834 kg (1 lb 13.4 oz) Exam:   General:  Pt is alert, follows commands appropriately, not in acute distress  HEENT: No icterus, No thrush,  [place]/AT. Janina Mayo out and stoma covered with dressing.  Cardiovascular: RRR, S1/S2, no rubs, no gallops  Respiratory: Diminished breath sounds left base. faint wheezes.  Abdomen: Soft/+BS, non tender, non distended, no guarding  Extremities: No  edema, No lymphangitis, No petechiae, No rashes, no synovitis  Data Reviewed: Basic Metabolic Panel:  Recent Labs Lab 08/26/12 0611 08/27/12 0605 08/28/12 0512 08/29/12 0510 08/30/12 0645  NA 139 136 137 136 137  K 4.1 4.1 4.1 4.7 5.1  CL 100 99 99 98 99  CO2 25 26 26 29 29   GLUCOSE 103* 99 100* 108* 133*  BUN 75* 79* 80* 90* 91*  CREATININE 2.98* 2.93* 2.83* 2.73* 3.13*  CALCIUM 8.8 8.7 8.5 8.8 8.8   Liver Function Tests: No results found for this basename: AST, ALT, ALKPHOS, BILITOT, PROT, ALBUMIN,  in the last 168 hours No results found for this basename: LIPASE, AMYLASE,  in the last 168 hours No results found for this basename: AMMONIA,  in the last 168 hours CBC:  Recent Labs Lab  08/24/12 0510 08/25/12 0500 08/26/12 0611  WBC 10.5 10.9* 12.6*  NEUTROABS  --  8.0*  --   HGB 9.0* 9.0* 9.2*  HCT 29.0* 28.7* 29.4*  MCV 81.9 81.1 80.5  PLT 227 239 273   Cardiac Enzymes: No results found for this basename: CKTOTAL, CKMB, CKMBINDEX, TROPONINI,  in the last 168 hours BNP: No components found with this basename: POCBNP,  CBG: No results found for this basename: GLUCAP,  in the last 168 hours  No results found for this or any previous visit (from the past 240 hour(s)).   Scheduled Meds: . ipratropium  0.5 mg Nebulization Q6H   And  . albuterol  2.5 mg Nebulization Q6H  . amLODipine  10 mg Oral Daily  . antiseptic oral rinse  15 mL Mouth Rinse Daily  . aspirin  81 mg Oral Daily  . atorvastatin  40 mg Oral Daily  . budesonide  0.25 mg Nebulization Q6H  . busPIRone  5 mg Oral BID  . carvedilol  12.5 mg Oral BID WC  . cloNIDine  0.1 mg Oral BID  . clopidogrel  75 mg Oral Q breakfast  . dextromethorphan-guaiFENesin  1 tablet Oral BID  . enoxaparin (LOVENOX) injection  30 mg Subcutaneous Q24H  . ferrous sulfate  325 mg Oral BID  . [START ON 08/31/2012] furosemide  40 mg  Oral BID  . hydrALAZINE  100 mg Oral TID  . isosorbide mononitrate  30 mg Oral Daily  . metoCLOPramide  5 mg Oral QID  . pantoprazole  40 mg Oral Daily  . sertraline  100 mg Oral QHS  . sodium chloride  3 mL Intravenous Q12H  . vitamin C  500 mg Oral Daily   Continuous Infusions:    Kela Millin, MD  [facility] Pager [phone]  If 7PM-7AM, please contact night-coverage [email or web address] Password [email or web address] 08/30/2012, 10:25 AM   LOS: 10 days

## 2012-08-30 NOTE — Progress Notes (Signed)
Spoke with patient's daughter [name] - she stated she received PMT message but could not return the call -[name] shared she works 7:30 till after 9 pm most days and is not able to come to hospital for a meeting "I told the nurse I want that Janina Mayo put back in..." writer tried to inform that this meeting would also address other issues related to patient's care -daughter adamant she cannot attend any meeting and is agreeable to PMT provider speaking with patient and then calling her after this discussion;  [name] informed patient has help at home a nurse, respiratory therapist and an aide that comes several hours every other day she was not sure if this was with [facility]- she stated she wants all these services to resume at discharge; informed this information will be shared with primary team  - writer spoke with patient to inform her of discussion with daughter and she was agreeable to PMT provider speaking with her later today approximately 1-1:30pm and then speaking with her daughter  Felicia David, RN 08/30/2012, 11:58 AM Palliative Medicine Team RN Liaison [phone]

## 2012-08-31 LAB — BASIC METABOLIC PANEL
Calcium: 8.7 mg/dL (ref 8.4–10.5)
GFR calc Af Amer: 17 mL/min — ABNORMAL LOW (ref >90)
GFR calc non Af Amer: 15 mL/min — ABNORMAL LOW (ref >90)
Sodium: 138 mEq/L (ref 135–145)

## 2012-08-31 MED ORDER — FUROSEMIDE 10 MG/ML IJ SOLN
40.0000 mg | Freq: Once | INTRAMUSCULAR | Status: AC
  Administered 2012-08-31: 40 mg via INTRAVENOUS
  Filled 2012-08-31: qty 4

## 2012-08-31 NOTE — Progress Notes (Signed)
Spoke with patient once again of the importance of CPAP. She states she does not feel like she needs it at this time and she is fine on nasal cannula. All necessary equipment is in her room in case she changes her mind and she has been instructed to let us know if she wants to wear at a later time. RT will continue to monitor.

## 2012-08-31 NOTE — Progress Notes (Signed)
PULMONARY/CCM NOTE  Requesting MD/Service: Thompson/[facility] Date of admission: 6/29 Date of consult: 7/08 Reason for consultation: trach tube malposition  HPI:  34 F with chronic trach (since autumn of 2013) initially placed 11/23/11 by DF for recurrent  Intubation, initially for hypercarbic resp failure, then subsequently for VC edema. She was re-admitted 12/03 - 12/08 after trach tube dislodged and could not be replaced. She refused repeat trach tube placement @ that time. She was discharged to [facility] without replacement of the trach tube. She presented 12/23 to ED with PNA and while in ED suffered cardiac arrest for which she was again intubated during resuscitation. Dr Pollyann Kennedy performed repeat tracheostomy tube placement 02/14/12 and the tube has remained since that time. She was seen in ED 6/07 with dislodgement of trach tube and it was replaced and she was discharged back to SNF. She was admitted this hospitalization by [facility] with dyspnea attributed to multiple factors. On the morning of this consultation, the RT noted that the trach tube appeared malpositioned with some blood in the tube's lumen. PCCM is asked to evaluate. See my findings below. The tube could not be replaced as there was no discernible stoma tract. She denies dyspnea and chest pain.  SUBJ - c/o dyspnea, no cough, CP Did not use CPAP overnight, RT efforts appreciated  Filed Vitals:   08/31/12 0052 08/31/12 0526 08/31/12 0932 08/31/12 1033  BP:  167/61 151/58   Pulse:  76 75   Temp:  98.2 F (36.8 C)    TempSrc:  Oral    Resp:  18    Height:      Weight:  180 lb 12.4 oz (82 kg)    SpO2: 99% 97%  98%    EXAM:  Gen: Chronically ill appearing in NAD HEENT: [place]/AT, EOMI, PERRL Neck: trach stoma closed. Superficial component of stoma with scant purulent secretions. No air leak noted even with vigorous cough. Trachea can be palpated but no tracheal component of stoma can be discerned Lungs: clear without wheezes Cardiovascular: RRR  s M Abdomen: soft, NT, NABS Musculoskeletal: chronic stasis changes, no edema Neuro: no focal deficits noted  DATA:     BMET    Component Value Date/Time   NA 138 08/31/2012 0450   K 5.1 08/31/2012 0450   CL 100 08/31/2012 0450   CO2 29 08/31/2012 0450   GLUCOSE 112* 08/31/2012 0450   BUN 88* 08/31/2012 0450   CREATININE 3.00* 08/31/2012 0450   CALCIUM 8.7 08/31/2012 0450   GFRNONAA 15* 08/31/2012 0450   GFRAA 17* 08/31/2012 0450    CBC    Component Value Date/Time   WBC 12.6* 08/26/2012 0611   RBC 3.65* 08/26/2012 0611   HGB 9.2* 08/26/2012 0611   HCT 29.4* 08/26/2012 0611   PLT 273 08/26/2012 0611   MCV 80.5 08/26/2012 0611   MCH 25.2* 08/26/2012 0611   MCHC 31.3 08/26/2012 0611   RDW 17.1* 08/26/2012 0611   LYMPHSABS 1.6 08/25/2012 0500   MONOABS 1.1* 08/25/2012 0500   EOSABS 0.0 08/25/2012 0500   BASOSABS 0.0 08/25/2012 0500    CXR: 7/3 CM with LLL ATX. No overt edema or infiltrate  IMPRESSION:   Tracheostomy complication (recurrent) - dislodged and stoma closure Chronic respiratory failure - multifactorial COPD, no active wheezing Chronic cor pulmonale Acute on chronic combined systolic and diastolic CHF (congestive heart failure) Documented history of OSA - she is unable to tell me when or how this was diagnosed CKD Adult failure to thrive due to  multiple comorbidities   DISC: For now she is tolerating decannulation but she has been down this path before.Given her prior history, it is likely that she will require repeat trach tube placement in the future unless we establish limitations such as DNR/DNI.  She has severe OSA - I discussed 2 options with her & daughter [name] - either replace trach electively or leave it out for now & use CPAP qhs - definitely some  risk for acute resp failure/arrest with second option. Given her inability to use CPAP & after further discussion with pt & daughter, best to replace trach electively  PLAN/REC:  -Have asked Dr Pollyann Kennedy to see for trach  revision -Topical care to stoma site per routine -O2 by nasal cannula- -[facility] consult to continue to address EOL issues with patient -clarified BD regimen - she was on 2 different beta agonists on a scheduled basis,discontinued aformoterol while on scheduled albuterol  Cyril Mourning MD. FCCP. [place] [facility] Pager [phone] If no response call [phone]

## 2012-08-31 NOTE — Consult Note (Signed)
Patient known to me from previous tracheostomy. Her trach has been out for  about 2 week now. She is breathing fine during the day but has significant apnea while sleeping. On examination, the trach site is healthy without any obvious mucus or air leak currently. No other masses identified. Recommend proceed with revision tracheostomy. I have discussed this with the patient and she is agreeable. We will try to schedule this in the near future. 

## 2012-08-31 NOTE — Progress Notes (Signed)
PULMONARY/CCM NOTE  Requesting MD/Service: Thompson/[facility] Date of admission: 6/29 Date of consult: 7/08 Reason for consultation: trach tube malposition  HPI:  36 F with chronic trach (since autumn of 2013) initially placed 11/23/11 by DF for recurrent  Intubation, initially for hypercarbic resp failure, then subsequently for VC edema. She was re-admitted 12/03 - 12/08 after trach tube dislodged and could not be replaced. She refused repeat trach tube placement @ that time. She was discharged to [facility] [place] without replacement of the trach tube. She presented 12/23 to ED with PNA and while in ED suffered cardiac arrest for which she was again intubated during resuscitation. Dr Pollyann Kennedy performed repeat tracheostomy tube placement 02/14/12 and the tube has remained since that time. She was seen in ED 6/07 with dislodgement of trach tube and it was replaced and she was discharged back to SNF. She was admitted this hospitalization by [facility] with dyspnea attributed to multiple factors. On the morning of this consultation, the RT noted that the trach tube appeared malpositioned with some blood in the tube's lumen. PCCM is asked to evaluate. See my findings below. The tube could not be replaced as there was no discernible stoma tract. She denies dyspnea and chest pain. Echo : EF 45%  SUBJ - c/o dyspnea, no cough, CP Did not use CPAP overnight, RT efforts appreciated  Filed Vitals:   08/31/12 0052 08/31/12 0526 08/31/12 0932 08/31/12 1033  BP:  167/61 151/58   Pulse:  76 75   Temp:  98.2 F (36.8 C)    TempSrc:  Oral    Resp:  18    Height:      Weight:  180 lb 12.4 oz (82 kg)    SpO2: 99% 97%  98%    EXAM:  Gen: Chronically ill appearing in NAD HEENT: /AT, EOMI, PERRL Neck: trach stoma closed. Superficial component of stoma with scant purulent secretions. No air leak noted even with vigorous cough. Trachea can be palpated but no tracheal component of stoma can be discerned Lungs: clear without  wheezes Cardiovascular: RRR s M Abdomen: soft, NT, NABS Musculoskeletal: chronic stasis changes, no edema Neuro: no focal deficits noted  DATA:     BMET    Component Value Date/Time   NA 138 08/31/2012 0450   K 5.1 08/31/2012 0450   CL 100 08/31/2012 0450   CO2 29 08/31/2012 0450   GLUCOSE 112* 08/31/2012 0450   BUN 88* 08/31/2012 0450   CREATININE 3.00* 08/31/2012 0450   CALCIUM 8.7 08/31/2012 0450   GFRNONAA 15* 08/31/2012 0450   GFRAA 17* 08/31/2012 0450    CBC    Component Value Date/Time   WBC 12.6* 08/26/2012 0611   RBC 3.65* 08/26/2012 0611   HGB 9.2* 08/26/2012 0611   HCT 29.4* 08/26/2012 0611   PLT 273 08/26/2012 0611   MCV 80.5 08/26/2012 0611   MCH 25.2* 08/26/2012 0611   MCHC 31.3 08/26/2012 0611   RDW 17.1* 08/26/2012 0611   LYMPHSABS 1.6 08/25/2012 0500   MONOABS 1.1* 08/25/2012 0500   EOSABS 0.0 08/25/2012 0500   BASOSABS 0.0 08/25/2012 0500    CXR: 7/3 CM with LLL ATX. No overt edema or infiltrate  IMPRESSION:   Tracheostomy complication (recurrent) - dislodged and stoma closure Chronic respiratory failure - multifactorial COPD, no active wheezing Chronic cor pulmonale Acute on chronic combined systolic and diastolic CHF (congestive heart failure) Documented history of OSA - she is unable to tell me when or how this was diagnosed CKD Adult failure  to thrive due to multiple comorbidities   DISC: For now she is tolerating decannulation but she has been down this path before.Given her prior history, it is likely that she will require repeat trach tube placement in the future unless we establish limitations such as DNR/DNI.  She has severe OSA - I discussed 2 options with her & daughter [name] - either replace trach electively or leave it out for now & use CPAP qhs - definitely some  risk for acute resp failure/arrest with second option. Given her inability to use CPAP & after further discussion with pt & daughter, best to replace trach electively  PLAN/REC:  -Have asked Dr Pollyann Kennedy  to see for trach revision -Topical care to stoma site per routine -O2 by nasal cannula- -[facility] consult to continue to address EOL issues with patient -clarified BD regimen - she was on 2 different beta agonists on a scheduled basis,discontinued aformoterol while on scheduled albuterol  Cyril Mourning MD. FCCP. [place] [facility] Pager [phone] If no response call [phone]

## 2012-08-31 NOTE — Progress Notes (Signed)
Patient is SOB. Not in any distress or discomfort. Patient stated that she feels more SOB than before. O2 Sats are at 98% on 2L and Respirations are 26. CCM MD was notified. MD stated that he would stop by to further assess. Will continue to monitor patient for further changes in condition.

## 2012-08-31 NOTE — Progress Notes (Signed)
Palliative Medicine Team SW Psychosocial follow up. Pt continues to endorse some difficulty breathing with extended conversation. Pt reports yesterday's GOC was a good opportunity to discuss desires with her dtr; family have yet to make definitive changes to GOC. Pt reports concern for her dtr who manages child day care center and has her own health concerns. 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 Pt reports limited social support outside her dtr and also reports the death of another dtr, but was unable to recall the date. Pt a bit guarded about discussing her emotional coping, and questionable in her orientation, but did agree to return visits for companionship.   Kennieth Francois, LCSWA PMT phone [phone] Pager [phone]

## 2012-08-31 NOTE — Progress Notes (Signed)
[facility] PROGRESS NOTE  ORELIA BRANDSTETTER ZOX:[id number] DOB: 1940-09-28 DOA: 08/20/2012 PCP: Gwynneth Aliment, MD  Assessment/Plan: Acute on chronic respiratory failure  -This is likely multifactorial including acute on chronic combined CHF, cor pulmonale, COPD, mucus plugging, OSA, and deconditioning  -appreciate pulmonology assistance- trach was dc'ed  7/8 and palliative care consulted- meeting planned for today, but daughter wants trach reinserted- I have discussed this with Dr Sung Amabile 7/9 and he will talk with pt's daughter as well -Appreciate palliative care assistance, from the patient and daughter want aggressivecare -The patient appeared to do well initially but has been complaining of increasing  dyspnea/shortness of breath-oxygenating at 93-100% on nasal cannula oxygen -Discussed patient with pulmonology/Dr. Vassie Loll today and he has consulted Dr. Pollyann Kennedy for a revision of tracheostomy today. Also give a dose of IV Lasix and follow Acute on chronic combined CHF/cor pulmonale  -initial proBNP 10275 -Although her chest x-ray does not suggest significant pulmonary edema, I suspect that she mostly has right-sided CHF, cor pulmonale  -She had significant peripheral edema and JVD which have improved. -She had received one dose of intravenous furosemide in the emergency department patient; received an additional 40 mg IV evening of 08/20/2012  -Echocardiogram on 07/13/2012 shows EF 45%, grade 2 diastolic dysfunction  -cr stable today, pt c/o of increasing shortness of breath, will give dose of IV Lasix as above and follow COPD --Patient s/p one dose of intravenous Solu-Medrol on 6/29, and prednisone was tapered off 7/8 - continue pulmicort, appreciate pulm assistance Brovana dc'ed 7/8 as pt on scheduled neb bronchodilators -Stable, appreciate pulmonology -continue Aggressive pulmonary toilet  -Physical therapy Acute on chronic renal failure, CKD stage IV  -Monitor renal function--Cr  stable  today 7/10- 3.00, hold lasix for today, follow and recheck -Baseline creatinine 2.5-2.7 History of NSTEMI (07/08/2012 admission)  -Cycle troponins--neg -Continue aspirin, Plavix, statin  Hypertension  -Continue amlodipine, carvedilol, hydralazine, clonidine  Anemia  -Multifactorial including anemia of chronic disease and iron deficiency  -Continue iron  Trach malposition Trach decannulated 7/9 after it was found almost out - palliative care consulted for EOL -discussed daughter [name]'s wishes to have it reinserted with Dr Kary Kos  -ENT consulted as above per pulmonology for trach revision, appreciate Dr. Reginia Naas assistance  GERD  -Continue PPI  Family Communication: Daughter updated by phone on 7/9 Disposition Plan: Home when medically stable.        Procedures/Studies: Dg Chest 2 View  08/20/2012   *RADIOLOGY REPORT*  Clinical Data: Shortness of breath.  Hypertension.  Cardiomyopathy.  CHEST - 2 VIEW  Comparison: 07/29/2012  Findings: Tracheostomy appropriately positioned.  Moderate cardiomegaly with atherosclerosis in the transverse aorta.  No definite pleural fluid. No pneumothorax.  No congestive failure.  Mild right base volume loss and subsegmental atelectasis.  Slight improvement in left base aeration. Probable subsegmental atelectasis remaining.  Moderate left hemidiaphragm elevation.  IMPRESSION: Cardiomegaly and moderate left hemidiaphragm elevation.  Bibasilar atelectasis without convincing evidence of acute superimposed process.   Original Report Authenticated By: Jeronimo Greaves, M.D.   Dg Chest Port 1 View  07/29/2012   *RADIOLOGY REPORT*  Clinical Data: 72 year old female tracheostomy replacement.  PORTABLE CHEST - 1 VIEW  Comparison: 07/15/2012 and earlier.  Findings: AP portable semi upright view 1820 hours.  Tracheostomy tube projects in the midline and appears adequately positioned. Stable lung volumes. Stable cardiomegaly and mediastinal contours. Interval  mildly decreased retrocardiac opacity with loss of definition of the left hemidiaphragm.  No pneumothorax or pulmonary edema.  No definite  effusion.  IMPRESSION: 1.  Tracheostomy tube in place with no adverse features. 2.  Chronic cardiomegaly and mildly decreased retrocardiac ventilation.   Original Report Authenticated By: Erskine Speed, M.D.         Subjective: states feeling more short of breath today, denies chest pain  Objective: Filed Vitals:   08/30/12 2059 08/30/12 2109 08/31/12 0052 08/31/12 0526  BP:  148/62  167/61  Pulse:  70  76  Temp:  98.4 F (36.9 C)  98.2 F (36.8 C)  TempSrc:  Oral  Oral  Resp:  18  18  Height:      Weight:    82 kg (180 lb 12.4 oz)  SpO2: 96% 93% 99% 97%    Intake/Output Summary (Last 24 hours) at 08/31/12 0845 Last data filed at 08/31/12 4098  Gross per 24 hour  Intake   1083 ml  Output   1701 ml  Net   -618 ml   Weight change: -0.3 kg (-10.6 oz) Exam:   General:  Pt is alert, follows commands appropriately, no accessory muscle use  HEENT: No icterus, No thrush,  [place]/AT. Janina Mayo out and stoma covered with dressing.  Cardiovascular: RRR, S1/S2, no rubs, no gallops  Respiratory: Diminished breath sounds at the bases. No wheezes  Abdomen: Soft/+BS, non tender, non distended, no guarding  Extremities: No  edema, No lymphangitis, No petechiae, No rashes, no synovitis  Data Reviewed: Basic Metabolic Panel:  Recent Labs Lab 08/27/12 0605 08/28/12 0512 08/29/12 0510 08/30/12 0645 08/31/12 0450  NA 136 137 136 137 138  K 4.1 4.1 4.7 5.1 5.1  CL 99 99 98 99 100  CO2 26 26 29 29 29   GLUCOSE 99 100* 108* 133* 112*  BUN 79* 80* 90* 91* 88*  CREATININE 2.93* 2.83* 2.73* 3.13* 3.00*  CALCIUM 8.7 8.5 8.8 8.8 8.7   Liver Function Tests: No results found for this basename: AST, ALT, ALKPHOS, BILITOT, PROT, ALBUMIN,  in the last 168 hours No results found for this basename: LIPASE, AMYLASE,  in the last 168 hours No results found  for this basename: AMMONIA,  in the last 168 hours CBC:  Recent Labs Lab 08/25/12 0500 08/26/12 0611  WBC 10.9* 12.6*  NEUTROABS 8.0*  --   HGB 9.0* 9.2*  HCT 28.7* 29.4*  MCV 81.1 80.5  PLT 239 273   Cardiac Enzymes: No results found for this basename: CKTOTAL, CKMB, CKMBINDEX, TROPONINI,  in the last 168 hours BNP: No components found with this basename: POCBNP,  CBG: No results found for this basename: GLUCAP,  in the last 168 hours  No results found for this or any previous visit (from the past 240 hour(s)).   Scheduled Meds: . ipratropium  0.5 mg Nebulization Q6H   And  . albuterol  2.5 mg Nebulization Q6H  . amLODipine  10 mg Oral Daily  . antiseptic oral rinse  15 mL Mouth Rinse Daily  . aspirin  81 mg Oral Daily  . atorvastatin  40 mg Oral Daily  . budesonide  0.25 mg Nebulization Q6H  . busPIRone  5 mg Oral BID  . carvedilol  12.5 mg Oral BID WC  . cloNIDine  0.1 mg Oral BID  . clopidogrel  75 mg Oral Q breakfast  . dextromethorphan-guaiFENesin  1 tablet Oral BID  . enoxaparin (LOVENOX) injection  30 mg Subcutaneous Q24H  . ferrous sulfate  325 mg Oral BID  . hydrALAZINE  100 mg Oral TID  . isosorbide mononitrate  30 mg Oral Daily  . metoCLOPramide  5 mg Oral QID  . pantoprazole  40 mg Oral Daily  . sertraline  100 mg Oral QHS  . sodium chloride  3 mL Intravenous Q12H  . vitamin C  500 mg Oral Daily   Continuous Infusions:    Kela Millin, MD  [facility] Pager [phone]  If 7PM-7AM, please contact night-coverage [email or web address] Password [email or web address] 08/31/2012, 8:45 AM   LOS: 11 days

## 2012-09-01 ENCOUNTER — Encounter (HOSPITAL_COMMUNITY): Payer: Self-pay | Admitting: Anesthesiology

## 2012-09-01 ENCOUNTER — Inpatient Hospital Stay (HOSPITAL_COMMUNITY): Payer: Medicare Other | Admitting: Anesthesiology

## 2012-09-01 ENCOUNTER — Encounter (HOSPITAL_COMMUNITY)
Admission: EM | Disposition: A | Discharge status: Home Health | Payer: Self-pay | Source: Home / Self Care | Attending: Internal Medicine

## 2012-09-01 DIAGNOSIS — J9620 Acute and chronic respiratory failure, unspecified whether with hypoxia or hypercapnia: Secondary | ICD-10-CM

## 2012-09-01 HISTORY — PX: TRACHEOSTOMY REVISION: SHX6133

## 2012-09-01 LAB — BASIC METABOLIC PANEL
Calcium: 8.7 mg/dL (ref 8.4–10.5)
GFR calc Af Amer: 17 mL/min — ABNORMAL LOW (ref >90)
GFR calc non Af Amer: 15 mL/min — ABNORMAL LOW (ref >90)
Potassium: 4.8 mEq/L (ref 3.5–5.1)
Sodium: 140 mEq/L (ref 135–145)

## 2012-09-01 LAB — POCT I-STAT 3, ART BLOOD GAS (G3+)
Acid-Base Excess: 6 mmol/L — ABNORMAL HIGH (ref 0.0–2.0)
pH, Arterial: 7.445 (ref 7.350–7.450)

## 2012-09-01 LAB — MRSA PCR SCREENING: MRSA by PCR: NEGATIVE

## 2012-09-01 SURGERY — REVISION, STOMA, TRACHEA
Anesthesia: General | Site: Neck | Wound class: Clean

## 2012-09-01 MED ORDER — SUCCINYLCHOLINE CHLORIDE 20 MG/ML IJ SOLN
INTRAMUSCULAR | Status: DC | PRN
  Administered 2012-09-01: 100 mg via INTRAVENOUS
  Administered 2012-09-01: 25 mg via INTRAVENOUS

## 2012-09-01 MED ORDER — PROPOFOL 10 MG/ML IV BOLUS
INTRAVENOUS | Status: DC | PRN
  Administered 2012-09-01: 100 mg via INTRAVENOUS

## 2012-09-01 MED ORDER — CHLORHEXIDINE GLUCONATE 0.12 % MT SOLN
OROMUCOSAL | Status: AC
  Administered 2012-09-01: 15 mL
  Filled 2012-09-01: qty 15

## 2012-09-01 MED ORDER — SODIUM CHLORIDE 0.9 % IV SOLN
INTRAVENOUS | Status: DC
  Administered 2012-09-01: 20:00:00 via INTRAVENOUS
  Administered 2012-09-02: 30 mL/h via INTRAVENOUS
  Administered 2012-09-04: 1000 mL via INTRAVENOUS

## 2012-09-01 MED ORDER — MORPHINE SULFATE 2 MG/ML IJ SOLN
2.0000 mg | INTRAMUSCULAR | Status: DC | PRN
  Administered 2012-09-01 – 2012-09-04 (×8): 2 mg via INTRAVENOUS
  Filled 2012-09-01 (×8): qty 1

## 2012-09-01 MED ORDER — FENTANYL CITRATE 0.05 MG/ML IJ SOLN
INTRAMUSCULAR | Status: DC | PRN
  Administered 2012-09-01: 50 ug via INTRAVENOUS

## 2012-09-01 MED ORDER — SODIUM CHLORIDE 0.9 % IV SOLN
INTRAVENOUS | Status: DC | PRN
  Administered 2012-09-01: 14:00:00 via INTRAVENOUS

## 2012-09-01 MED ORDER — ONDANSETRON HCL 4 MG/2ML IJ SOLN
INTRAMUSCULAR | Status: DC | PRN
  Administered 2012-09-01: 4 mg via INTRAVENOUS

## 2012-09-01 MED ORDER — 0.9 % SODIUM CHLORIDE (POUR BTL) OPTIME
TOPICAL | Status: DC | PRN
  Administered 2012-09-01: 50 mL

## 2012-09-01 MED ORDER — LIDOCAINE HCL (CARDIAC) 20 MG/ML IV SOLN
INTRAVENOUS | Status: DC | PRN
  Administered 2012-09-01: 50 mg via INTRAVENOUS

## 2012-09-01 SURGICAL SUPPLY — 33 items
BENZOIN TINCTURE PRP APPL 2/3 (GAUZE/BANDAGES/DRESSINGS) IMPLANT
BLADE SURG 15 STRL LF DISP TIS (BLADE) ×1 IMPLANT
BLADE SURG 15 STRL SS (BLADE) ×1
CANISTER SUCTION 2500CC (MISCELLANEOUS) ×2 IMPLANT
CLEANER TIP ELECTROSURG 2X2 (MISCELLANEOUS) ×2 IMPLANT
CLOTH BEACON ORANGE TIMEOUT ST (SAFETY) ×2 IMPLANT
COVER SURGICAL LIGHT HANDLE (MISCELLANEOUS) ×2 IMPLANT
DECANTER SPIKE VIAL GLASS SM (MISCELLANEOUS) ×2 IMPLANT
ELECT COATED BLADE 2.86 ST (ELECTRODE) ×2 IMPLANT
ELECT REM PT RETURN 9FT ADLT (ELECTROSURGICAL) ×2
ELECTRODE REM PT RTRN 9FT ADLT (ELECTROSURGICAL) ×1 IMPLANT
GAUZE SPONGE 4X4 16PLY XRAY LF (GAUZE/BANDAGES/DRESSINGS) ×2 IMPLANT
GLOVE ECLIPSE 7.5 STRL STRAW (GLOVE) ×2 IMPLANT
GOWN STRL NON-REIN LRG LVL3 (GOWN DISPOSABLE) ×4 IMPLANT
KIT BASIN OR (CUSTOM PROCEDURE TRAY) ×2 IMPLANT
KIT ROOM TURNOVER OR (KITS) ×2 IMPLANT
NEEDLE HYPO 25GX1X1/2 BEV (NEEDLE) IMPLANT
NS IRRIG 1000ML POUR BTL (IV SOLUTION) ×2 IMPLANT
PACK EENT II TURBAN DRAPE (CUSTOM PROCEDURE TRAY) ×2 IMPLANT
PAD ARMBOARD 7.5X6 YLW CONV (MISCELLANEOUS) ×4 IMPLANT
PENCIL FOOT CONTROL (ELECTRODE) ×2 IMPLANT
SPONGE DRAIN TRACH 4X4 STRL 2S (GAUZE/BANDAGES/DRESSINGS) ×2 IMPLANT
SUT CHROMIC 2 0 SH (SUTURE) ×2 IMPLANT
SUT ETHILON 3 0 PS 1 (SUTURE) ×2 IMPLANT
SUT SILK 4 0 TIE 10X30 (SUTURE) ×2 IMPLANT
SUT SILK 4 0 TIES 17X18 (SUTURE) ×2 IMPLANT
SYR 20CC LL (SYRINGE) ×2 IMPLANT
SYR CONTROL 10ML LL (SYRINGE) IMPLANT
TOWEL OR 17X24 6PK STRL BLUE (TOWEL DISPOSABLE) ×2 IMPLANT
TOWEL OR 17X26 10 PK STRL BLUE (TOWEL DISPOSABLE) ×2 IMPLANT
TUBE CONNECTING 12X1/4 (SUCTIONS) ×2 IMPLANT
TUBE TRACH SHILEY  6 DIST  CUF (TUBING) ×2 IMPLANT
TUBE TRACH SHILEY 8 DIST CUF (TUBING) ×2 IMPLANT

## 2012-09-01 NOTE — Progress Notes (Signed)
PMT SW Brief check in with pt prior to trach revision. Allowed pt time to express her mild anxiety related to her procedure and was thankful for supportive visit. Pt denied other acute emotional needs at this time. Available as needed.   Kennieth Francois, LCSWA PMT Phone [phone] Pager [phone]

## 2012-09-01 NOTE — H&P (View-Only) (Signed)
Patient known to me from previous tracheostomy. Her trach has been out for  about 2 week now. She is breathing fine during the day but has significant apnea while sleeping. On examination, the trach site is healthy without any obvious mucus or air leak currently. No other masses identified. Recommend proceed with revision tracheostomy. I have discussed this with the patient and she is agreeable. We will try to schedule this in the near future.

## 2012-09-01 NOTE — Preoperative (Signed)
Beta Blockers   Reason not to administer Beta Blockers:Not Applicable 

## 2012-09-01 NOTE — Progress Notes (Signed)
eLink Physician-Brief Progress Note Patient Name: [name] DOB: 15-Sep-1940 MRN: [id number]  Date of Service  09/01/2012   HPI/Events of Note   Back from OR intubated  eICU Interventions   Vent orders Morphine ABG GI / DVT Px already ordered    Intervention Category Major Interventions: Other:  Lonia Farber 09/01/2012, 5:46 PM

## 2012-09-01 NOTE — Plan of Care (Signed)
Problem: Phase II Progression Outcomes Goal: O2 sats > equal to 90% on RA or at baseline Outcome: Progressing Patient is NPO at present for tracheostomy revision.  Consent obtained and patient is aware of procedure.  Using oxygen via nasal cannula at 2L this shift.  No complications with O2 sat, continuous pulse ox in place.  Will continue to monitor patient condition.

## 2012-09-01 NOTE — Progress Notes (Signed)
Spoke with DR.Joslin, Dr.Rosen, and critical care r/t pt's postop status. Orders received for pt to be transferred to ICU and critical care will manage from there.

## 2012-09-01 NOTE — Progress Notes (Signed)
PULMONARY/CCM NOTE  Requesting MD/Service: Thompson/[facility] Date of admission: 6/29 Date of consult: 7/08 Reason for consultation: trach tube malposition  HPI:  27 F with chronic trach (since autumn of 2013) initially placed 11/23/11 by DF for recurrent  Intubation, initially for hypercarbic resp failure, then subsequently for VC edema. She was re-admitted 12/03 - 12/08 after trach tube dislodged and could not be replaced. She refused repeat trach tube placement @ that time. She was discharged to [facility] without replacement of the trach tube. She presented 12/23 to ED with PNA and while in ED suffered cardiac arrest for which she was again intubated during resuscitation. Dr Pollyann Kennedy performed repeat tracheostomy tube placement 02/14/12 and the tube has remained since that time. She was seen in ED 6/07 with dislodgement of trach tube and it was replaced and she was discharged back to SNF. She was admitted this hospitalization by [facility] with dyspnea attributed to multiple factors. On the morning of this consultation, the RT noted that the trach tube appeared malpositioned with some blood in the tube's lumen. PCCM is asked to evaluate. See my findings below. The tube could not be replaced as there was no discernible stoma tract. She denies dyspnea and chest pain. Echo : EF 45%  SUBJ -  Going to OR for trach revision    Filed Vitals:   09/01/12 0114 09/01/12 0623 09/01/12 0824 09/01/12 1023  BP:  174/45  179/60  Pulse:  64  63  Temp:  97.7 F (36.5 C)  98.2 F (36.8 C)  TempSrc:  Oral  Oral  Resp:  18  20  Height:      Weight:  184 lb 8 oz (83.689 kg)    SpO2: 96% 99% 99% 94%    EXAM:  Gen: Chronically ill appearing in NAD HEENT: [place]/AT, EOMI, PERRL Neck: trach stoma closed.w/ dressing  Intact  Lungs: clear without wheezes Cardiovascular: RRR s M Abdomen: soft, NT, NABS Musculoskeletal: chronic stasis changes, no edema Neuro: no focal deficits noted  DATA:     BMET    Component Value  Date/Time   NA 140 09/01/2012 0637   K 4.8 09/01/2012 0637   CL 101 09/01/2012 0637   CO2 29 09/01/2012 0637   GLUCOSE 80 09/01/2012 0637   BUN 80* 09/01/2012 0637   CREATININE 2.94* 09/01/2012 0637   CALCIUM 8.7 09/01/2012 0637   GFRNONAA 15* 09/01/2012 0637   GFRAA 17* 09/01/2012 0637    CBC    Component Value Date/Time   WBC 12.6* 08/26/2012 0611   RBC 3.65* 08/26/2012 0611   HGB 9.2* 08/26/2012 0611   HCT 29.4* 08/26/2012 0611   PLT 273 08/26/2012 0611   MCV 80.5 08/26/2012 0611   MCH 25.2* 08/26/2012 0611   MCHC 31.3 08/26/2012 0611   RDW 17.1* 08/26/2012 0611   LYMPHSABS 1.6 08/25/2012 0500   MONOABS 1.1* 08/25/2012 0500   EOSABS 0.0 08/25/2012 0500   BASOSABS 0.0 08/25/2012 0500    CXR: 7/3 CM with LLL ATX. No overt edema or infiltrate  IMPRESSION:   Tracheostomy complication (recurrent) - dislodged and stoma closure Chronic respiratory failure - multifactorial COPD, no active wheezing Chronic cor pulmonale Acute on chronic combined systolic and diastolic CHF (congestive heart failure) Documented history of OSA - unable to tell me when or how this was diagnosed CKD Adult failure to thrive due to multiple comorbidities   DISCUSSION :  She has severe OSA - I discussed 2 options with her & daughter [name] - either replace  trach electively or leave it out for now & use CPAP qhs - definitely some  risk for acute resp failure/arrest with second option. Given her inability to use CPAP & after further discussion with pt & daughter, best to replace trach electively  PLAN/REC:  - For trach revision today with Dr Pollyann Kennedy - progress rapidly with PMV & swallow after -Trach care  -O2 for sat 88-92% -[facility] Palliative Care consult to continue to address EOL issues with patient - cont BD    PARRETT,[name] NP-C    Care during the described time interval was provided by me and/or other providers on the critical care team.  I have reviewed this patient's available data, including medical history, events of  note, physical examination and test results as part of my evaluation  [facility]  [phone]

## 2012-09-01 NOTE — Progress Notes (Signed)
Pt transported from PACU to 2300 on vent

## 2012-09-01 NOTE — Transfer of Care (Signed)
Immediate Anesthesia Transfer of Care Note  Patient: [name]  Procedure(s) Performed: Procedure(s): TRACHEOSTOMY REVISION (N/A)  Patient Location: PACU  Anesthesia Type:General  Level of Consciousness: awake, alert  and oriented  Airway & Oxygen Therapy: Patient placed on Ventilator (see vital sign flow sheet for setting)  Post-op Assessment: Report given to PACU RN and Post -op Vital signs reviewed and stable  Post vital signs: Reviewed and stable  Complications: No apparent anesthesia complications

## 2012-09-01 NOTE — Progress Notes (Addendum)
[facility] PROGRESS NOTE  WETONA VIRAMONTES BJY:[id number] DOB: 10-Feb-1941 DOA: 08/20/2012 PCP: Gwynneth Aliment, MD  Assessment/Plan: Acute on chronic respiratory failure  -This is likely multifactorial including acute on chronic combined CHF, cor pulmonale, COPD, mucus plugging, OSA, and deconditioning  -appreciate pulmonology assistance- trach was dc'ed  7/8 and palliative care consulted- meeting planned for today, but daughter wants trach reinserted- I have discussed this with Dr Sung Amabile 7/9 and he will talk with pt's daughter as well -Appreciate palliative care assistance, from the patient and daughter want aggressivecare -The patient appeared to do well initially but has been complaining of increasing  dyspnea/shortness of breath-oxygenating at 93-100% on nasal cannula oxygen -Discussed patient with pulmonology/Dr. Vassie Loll 7/10 and he consulted Dr. Pollyann Kennedy for a revision of tracheostom. Also give a dose of IV Lasix 7/10 -Pt for trach revision per Dr Pollyann Kennedy today Acute on chronic combined CHF/cor pulmonale  -initial proBNP 10275 -Although her chest x-ray does not suggest significant pulmonary edema, I suspect that she mostly has right-sided CHF, cor pulmonale  -She had significant peripheral edema and JVD which have improved. -She had received one dose of intravenous furosemide in the emergency department patient; received an additional 40 mg IV evening of 08/20/2012  -Echocardiogram on 07/13/2012 shows EF 45%, grade 2 diastolic dysfunction  -pt c/o of increasing shortness of breath, given IV Lasix 7/10  -follow and further diurese as appropriate COPD --Patient s/p one dose of intravenous Solu-Medrol on 6/29, and prednisone was tapered off 7/8 - continue pulmicort, appreciate pulm assistance Brovana dc'ed 7/8 as pt on scheduled neb bronchodilators -Stable, appreciate pulmonology -continue Aggressive pulmonary toilet  -Physical therapy Acute on chronic renal failure, CKD stage IV   -Monitor renal function--Cr trending down  today 7/11- 2.94, follow and recheck -Baseline creatinine 2.5-2.7 History of NSTEMI (07/08/2012 admission)  -Cycle troponins--neg -Continue aspirin, Plavix, statin  Hypertension  -Continue amlodipine, carvedilol, hydralazine, clonidine  Anemia  -Multifactorial including anemia of chronic disease and iron deficiency  -Continue iron  Trach malposition Trach decannulated 7/9 after it was found almost out - palliative care consulted for EOL -discussed daughter [name]'s wishes to have it reinserted with Dr Kary Kos  -ENT consulted as above per pulmonology for trach revision 7/10, appreciate Dr. Reginia Naas assistance -Dr Pollyann Kennedy to do revision today  GERD  -Continue PPI  Family Communication: none at bedside Disposition Plan: Home when medically stable.        Procedures/Studies: Dg Chest 2 View  08/20/2012   *RADIOLOGY REPORT*  Clinical Data: Shortness of breath.  Hypertension.  Cardiomyopathy.  CHEST - 2 VIEW  Comparison: 07/29/2012  Findings: Tracheostomy appropriately positioned.  Moderate cardiomegaly with atherosclerosis in the transverse aorta.  No definite pleural fluid. No pneumothorax.  No congestive failure.  Mild right base volume loss and subsegmental atelectasis.  Slight improvement in left base aeration. Probable subsegmental atelectasis remaining.  Moderate left hemidiaphragm elevation.  IMPRESSION: Cardiomegaly and moderate left hemidiaphragm elevation.  Bibasilar atelectasis without convincing evidence of acute superimposed process.   Original Report Authenticated By: Jeronimo Greaves, M.D.   Dg Chest Port 1 View  07/29/2012   *RADIOLOGY REPORT*  Clinical Data: 72 year old female tracheostomy replacement.  PORTABLE CHEST - 1 VIEW  Comparison: 07/15/2012 and earlier.  Findings: AP portable semi upright view 1820 hours.  Tracheostomy tube projects in the midline and appears adequately positioned. Stable lung volumes. Stable cardiomegaly  and mediastinal contours. Interval mildly decreased retrocardiac opacity with loss of definition of the left hemidiaphragm.  No pneumothorax or pulmonary  edema.  No definite effusion.  IMPRESSION: 1.  Tracheostomy tube in place with no adverse features. 2.  Chronic cardiomegaly and mildly decreased retrocardiac ventilation.   Original Report Authenticated By: Erskine Speed, M.D.         Subjective: Still with some SOB this am, denies chest pain. Awaiting trach revision today  Objective: Filed Vitals:   09/01/12 0006 09/01/12 0114 09/01/12 0623 09/01/12 0824  BP:   174/45   Pulse: 66  64   Temp:   97.7 F (36.5 C)   TempSrc:   Oral   Resp: 18  18   Height:      Weight:   83.689 kg (184 lb 8 oz)   SpO2: 100% 96% 99% 99%    Intake/Output Summary (Last 24 hours) at 09/01/12 0858 Last data filed at 09/01/12 1610  Gross per 24 hour  Intake    846 ml  Output   1850 ml  Net  -1004 ml   Weight change: 1.689 kg (3 lb 11.6 oz) Exam:   General:  Pt is alert, follows commands appropriately, no accessory muscle use  HEENT: No icterus, No thrush,  [place]/AT. Janina Mayo out and stoma covered with dressing.  Cardiovascular: RRR, S1/S2, no rubs, no gallops  Respiratory: Diminished breath sounds at the bases. No wheezes  Abdomen: Soft/+BS, non tender, non distended, no guarding  Extremities: No  edema, No lymphangitis, No petechiae, No rashes, no synovitis  Data Reviewed: Basic Metabolic Panel:  Recent Labs Lab 08/27/12 0605 08/28/12 0512 08/29/12 0510 08/30/12 0645 08/31/12 0450  NA 136 137 136 137 138  K 4.1 4.1 4.7 5.1 5.1  CL 99 99 98 99 100  CO2 26 26 29 29 29   GLUCOSE 99 100* 108* 133* 112*  BUN 79* 80* 90* 91* 88*  CREATININE 2.93* 2.83* 2.73* 3.13* 3.00*  CALCIUM 8.7 8.5 8.8 8.8 8.7   Liver Function Tests: No results found for this basename: AST, ALT, ALKPHOS, BILITOT, PROT, ALBUMIN,  in the last 168 hours No results found for this basename: LIPASE, AMYLASE,  in the  last 168 hours No results found for this basename: AMMONIA,  in the last 168 hours CBC:  Recent Labs Lab 08/26/12 0611  WBC 12.6*  HGB 9.2*  HCT 29.4*  MCV 80.5  PLT 273   Cardiac Enzymes: No results found for this basename: CKTOTAL, CKMB, CKMBINDEX, TROPONINI,  in the last 168 hours BNP: No components found with this basename: POCBNP,  CBG: No results found for this basename: GLUCAP,  in the last 168 hours  No results found for this or any previous visit (from the past 240 hour(s)).   Scheduled Meds: . ipratropium  0.5 mg Nebulization Q6H   And  . albuterol  2.5 mg Nebulization Q6H  . amLODipine  10 mg Oral Daily  . antiseptic oral rinse  15 mL Mouth Rinse Daily  . aspirin  81 mg Oral Daily  . atorvastatin  40 mg Oral Daily  . budesonide  0.25 mg Nebulization Q6H  . busPIRone  5 mg Oral BID  . carvedilol  12.5 mg Oral BID WC  . cloNIDine  0.1 mg Oral BID  . clopidogrel  75 mg Oral Q breakfast  . dextromethorphan-guaiFENesin  1 tablet Oral BID  . enoxaparin (LOVENOX) injection  30 mg Subcutaneous Q24H  . ferrous sulfate  325 mg Oral BID  . hydrALAZINE  100 mg Oral TID  . isosorbide mononitrate  30 mg Oral Daily  .  metoCLOPramide  5 mg Oral QID  . pantoprazole  40 mg Oral Daily  . sertraline  100 mg Oral QHS  . sodium chloride  3 mL Intravenous Q12H  . vitamin C  500 mg Oral Daily   Continuous Infusions:    Kela Millin, MD  [facility] Pager [phone]  If 7PM-7AM, please contact night-coverage [email or web address] Password [email or web address] 09/01/2012, 8:58 AM   LOS: 12 days

## 2012-09-01 NOTE — Anesthesia Postprocedure Evaluation (Signed)
  Anesthesia Post-op Note  Patient: [name]  Procedure(s) Performed: Procedure(s): TRACHEOSTOMY REVISION (N/A)  Patient Location: PACU  Anesthesia Type:General  Level of Consciousness: awake, alert  and oriented  Airway and Oxygen Therapy: Patient Spontanous Breathing, Patient connected to tracheostomy mask oxygen and Patient placed on Ventilator (see vital sign flow sheet for setting)  Post-op Pain: none  Post-op Assessment: Post-op Vital signs reviewed, Patient's Cardiovascular Status Stable, Respiratory Function Stable, Patent Airway and Pain level controlled  Post-op Vital Signs: stable  Complications: No apparent anesthesia complications

## 2012-09-01 NOTE — Anesthesia Preprocedure Evaluation (Addendum)
Anesthesia Evaluation  Patient identified by MRN, date of birth, ID band Patient awake    Reviewed: Allergy & Precautions, H&P , NPO status , Patient's Chart, lab work & pertinent test results, reviewed documented beta blocker date and time   History of Anesthesia Complications Negative for: history of anesthetic complications  Airway Mallampati: II TM Distance: >3 FB Neck ROM: Full    Dental  (+) Edentulous Upper, Edentulous Lower and Dental Advisory Given   Pulmonary shortness of breath and with exertion, asthma , sleep apnea , pneumonia -, resolved, COPD COPD inhaler and oxygen dependent,  breath sounds clear to auscultation        Cardiovascular hypertension, Pt. on medications and Pt. on home beta blockers + angina at rest + CAD, + Past MI, + Peripheral Vascular Disease and +CHF + dysrhythmias Rhythm:Regular Rate:Normal     Neuro/Psych PSYCHIATRIC DISORDERS Anxiety Depression CVA    GI/Hepatic GERD-  Medicated and Controlled,  Endo/Other  diabetes, Type 2  Renal/GU Renal InsufficiencyRenal disease     Musculoskeletal   Abdominal   Peds  Hematology   Anesthesia Other Findings   Reproductive/Obstetrics                         Anesthesia Physical Anesthesia Plan  ASA: III  Anesthesia Plan: General   Post-op Pain Management:    Induction: Intravenous  Airway Management Planned: Oral ETT and Video Laryngoscope Planned  Additional Equipment:   Intra-op Plan:   Post-operative Plan:   Informed Consent: I have reviewed the patients History and Physical, chart, labs and discussed the procedure including the risks, benefits and alternatives for the proposed anesthesia with the patient or authorized representative who has indicated his/her understanding and acceptance.     Plan Discussed with: CRNA, Anesthesiologist and Surgeon  Anesthesia Plan Comments: (Chronic respiratory  failure Renal insufficiency Cr. 2.94 CAD Non-stemi 07/08/12 Htn Sleep apnea Chronic Cor Pulmonale  Plan GA with oral ETT  Kipp Brood, MD)        Anesthesia Quick Evaluation

## 2012-09-01 NOTE — Progress Notes (Signed)
This NP visited with daughter [name] at bedside while patient was in OR having revision of Trach completed.  Offered emotional support  And encouraged questions and concerns.  [name] verbalizes her understanding of her mother's poor  medical condition and overall poor prognosis.  She "knows" her mother would never accept dialysis.  I encouraged her to continue discussion with her mother into the future regarding advanced directives, and assured her Palliative Medicine would continue to support holistically.  Lorinda Creed NP  Palliative Medicine Team Team Phone # [phone] Pager [phone]

## 2012-09-01 NOTE — Interval H&P Note (Signed)
History and Physical Interval Note:  09/01/2012 2:23 PM  [name]  has presented today for surgery, with the diagnosis of Sleep apnea  The various methods of treatment have been discussed with the patient and family. After consideration of risks, benefits and other options for treatment, the patient has consented to  Procedure(s): TRACHEOSTOMY REVISION (N/A) as a surgical intervention .  The patient's history has been reviewed, patient examined, no change in status, stable for surgery.  I have reviewed the patient's chart and labs.  Questions were answered to the patient's satisfaction.     [name]

## 2012-09-01 NOTE — Op Note (Signed)
09/01/2012 3:25 PM   PATIENT:  Peggye Form, 72 y.o. female  PRE-OPERATIVE DIAGNOSIS:  Sleep apnea  POST-OPERATIVE DIAGNOSIS:  Sleep apnea   PROCEDURE:  Procedure(s): TRACHEOSTOMY, revision  SURGEON:  Surgeon(s): Susy Frizzle, MD  ASSISTANTS: none   ANESTHESIA:   general  EBL: Minimal   DRAINS: none   LOCAL MEDICATIONS USED:  NONE  COUNTS CORRECT:  YES  PROCEDURE DETAILS: Patient was taken to the operating room and placed on the operating table in the supine position. A shoulder roll was placed for positioning. The patient was previously orally intubated. The neck was prepped and draped in a standard fashion. The previous tracheostomy site was excised using electrocautery and followed all the way down through scar tissue to the trachea. The previous tracheostomy was opened and a trach spreader was used. The initial attempt to place a #8 was unsuccessful so a #6 was inserted without difficulty. The cuff was inflated. The shield was secured to the skin using nylon sutures and Velcro straps . The patient was then transferred back to the intensive care unit in stable condition.  PLAN OF CARE: Transfer to ICU  PATIENT DISPOSITION:  ICU - hemodynamically stable.

## 2012-09-02 DIAGNOSIS — Z930 Tracheostomy status: Secondary | ICD-10-CM

## 2012-09-02 LAB — GLUCOSE, CAPILLARY
Glucose-Capillary: 87 mg/dL (ref 70–99)
Glucose-Capillary: 86 mg/dL (ref 70–99)
Glucose-Capillary: 81 mg/dL (ref 70–99)
Glucose-Capillary: 84 mg/dL (ref 70–99)
Glucose-Capillary: 81 mg/dL (ref 70–99)

## 2012-09-02 LAB — BASIC METABOLIC PANEL
CO2: 29 mEq/L (ref 19–32)
Calcium: 8.8 mg/dL (ref 8.4–10.5)
Chloride: 103 mEq/L (ref 96–112)
Creatinine, Ser: 2.49 mg/dL — ABNORMAL HIGH (ref 0.50–1.10)
GFR calc Af Amer: 21 mL/min — ABNORMAL LOW (ref >90)
Sodium: 140 mEq/L (ref 135–145)

## 2012-09-02 LAB — CBC
MCH: 25.8 pg — ABNORMAL LOW (ref 26.0–34.0)
MCV: 81.9 fL (ref 78.0–100.0)
Platelets: 198 10*3/uL (ref 150–400)
RBC: 3.1 MIL/uL — ABNORMAL LOW (ref 3.87–5.11)
RDW: 18.7 % — ABNORMAL HIGH (ref 11.5–15.5)
WBC: 11.5 10*3/uL — ABNORMAL HIGH (ref 4.0–10.5)

## 2012-09-02 MED ORDER — INSULIN ASPART 100 UNIT/ML ~~LOC~~ SOLN
0.0000 [IU] | SUBCUTANEOUS | Status: DC
  Administered 2012-09-04 – 2012-09-07 (×7): 1 [IU] via SUBCUTANEOUS
  Administered 2012-09-09: 2 [IU] via SUBCUTANEOUS
  Administered 2012-09-10 – 2012-09-12 (×3): 1 [IU] via SUBCUTANEOUS
  Administered 2012-09-12: 2 [IU] via SUBCUTANEOUS
  Administered 2012-09-13: 1 [IU] via SUBCUTANEOUS

## 2012-09-02 MED ORDER — METOPROLOL TARTRATE 1 MG/ML IV SOLN
5.0000 mg | Freq: Three times a day (TID) | INTRAVENOUS | Status: AC
  Administered 2012-09-02: 5 mg via INTRAVENOUS
  Filled 2012-09-02: qty 5

## 2012-09-02 MED ORDER — HYDRALAZINE HCL 20 MG/ML IJ SOLN
INTRAMUSCULAR | Status: AC
  Administered 2012-09-02: 10 mg
  Filled 2012-09-02: qty 1

## 2012-09-02 MED ORDER — PANTOPRAZOLE SODIUM 40 MG IV SOLR
40.0000 mg | Freq: Every day | INTRAVENOUS | Status: AC
  Administered 2012-09-02: 40 mg via INTRAVENOUS
  Filled 2012-09-02: qty 40

## 2012-09-02 MED ORDER — HYDRALAZINE HCL 20 MG/ML IJ SOLN
10.0000 mg | INTRAMUSCULAR | Status: DC | PRN
  Administered 2012-09-02 – 2012-09-10 (×7): 10 mg via INTRAVENOUS
  Filled 2012-09-02 (×8): qty 1

## 2012-09-02 MED ORDER — CHLORHEXIDINE GLUCONATE 0.12 % MT SOLN
OROMUCOSAL | Status: AC
  Administered 2012-09-02: 15 mL
  Filled 2012-09-02: qty 15

## 2012-09-02 NOTE — Progress Notes (Signed)
eLink Physician-Brief Progress Note Patient Name: [name] DOB: 1941-02-23 MRN: [id number]  Date of Service  09/02/2012   HPI/Events of Note   Pt did not get swallow study post trach revision and has multiple po meds ordered  eICU Interventions  Placed order to hold po meds for now and placed orders for necessary meds to be converted to iv.    Intervention Category Minor Interventions: Routine modifications to care plan (e.g. PRN medications for pain, fever)  [name] K. 09/02/2012, 4:23 PM

## 2012-09-02 NOTE — Progress Notes (Signed)
[facility] PROGRESS NOTE  KORINNA TAT VOZ:[id number] DOB: 1940/04/26 DOA: 08/20/2012 PCP: Gwynneth Aliment, MD Events of last pm noted pt on vent overnight s/p trach revision, discussed with Dr Craige Cotta and pt now transferred to [facility] and they will call [facility] for transfer back once pt weaned off vent. Thanks for assistance.     Kela Millin  [facility] Pager [phone]. If 7PM-7AM, please contact night-coverage at [email or web address], password [facility] 09/02/2012, 8:49 AM  LOS: 13 days

## 2012-09-02 NOTE — Progress Notes (Signed)
PULMONARY  / CRITICAL CARE MEDICINE  Name: [name] MRN: [id number] DOB: [birth year]    ADMISSION DATE:  08/20/2012 CONSULTATION DATE:  08/29/2012  REFERRING MD :  [facility]  CHIEF COMPLAINT: Dyspnea.  BRIEF PATIENT DESCRIPTION:  72 yo female s/p trach after cardiac arrest December 2013 present with dyspnea 2nd to CHF, COPD, cor pulmonale, OSA/OHS.  Initial trach by Dr. Tyson Alias 11/23/11.  Repeat trach by Dr. Pollyann Kennedy 02/14/12.  PCCM asked to assess for trach malposition.  SIGNIFICANT EVENTS: 7/08 Trach malposition >> left out 7/09 Palliative care consult >> full code, pt declined CPAP  7/10 ENT consult 7/11 Trach revision  STUDIES:   LINES / TUBES: #8 Trach (Dr. Pollyann Kennedy) 7/11 >>   SUBJECTIVE:  Neck sore.  Feels thirsty.  VITAL SIGNS: Temp:  [97.4 F (36.3 C)-98.5 F (36.9 C)] 98.5 F (36.9 C) (07/12 0400) Pulse Rate:  [51-113] 58 (07/12 0700) Resp:  [12-32] 13 (07/12 0700) BP: (118-193)/(49-84) 136/54 mmHg (07/12 0700) SpO2:  [40 %-100 %] 100 % (07/12 0700) FiO2 (%):  [40 %] 40 % (07/12 0700) VENTILATOR SETTINGS: Vent Mode:  [-] PRVC FiO2 (%):  [40 %] 40 % Set Rate:  [12 bmp] 12 bmp Vt Set:  [440 mL] 440 mL PEEP:  [5 cmH20] 5 cmH20 Plateau Pressure:  [18 cmH20-28 cmH20] 20 cmH20 INTAKE / OUTPUT: Intake/Output     07/11 0701 - 07/12 0700 07/12 0701 - 07/13 0700   P.O. 0    I.V. (mL/kg) 1175 (14)    Total Intake(mL/kg) 1175 (14)    Urine (mL/kg/hr) 1600 (0.8)    Stool 1 (0)    Blood 20 (0)    Total Output 1621     Net -446          Urine Occurrence 2 x      PHYSICAL EXAMINATION: General: No distress Neuro:  RASS 0, normal strength HEENT:  Trach site clean Cardiovascular:  regular Lungs:  No wheeze Abdomen:  Soft, non tender Musculoskeletal:  No edema Skin:  No rashes  LABS: CBC Recent Labs     09/02/12  0345  WBC  11.5*  HGB  8.0*  HCT  25.4*  PLT  198   BMET Recent Labs     08/31/12  0450  09/01/12  0637  09/02/12  0345   NA  138  140  140  K  5.1  4.8  4.9  CL  100  101  103  CO2  29  29  29   BUN  88*  80*  70*  CREATININE  3.00*  2.94*  2.49*  GLUCOSE  112*  80  88    Electrolytes Recent Labs     08/31/12  0450  09/01/12  0637  09/02/12  0345  CALCIUM  8.7  8.7  8.8   ABG Recent Labs     09/01/12  1757  PHART  7.445  PCO2ART  44.4  PO2ART  95.0   Glucose Recent Labs     09/01/12  1358  09/02/12  0348  GLUCAP  104*  87    Imaging No results found.    ASSESSMENT / PLAN:  PULMONARY A: Acute on chronic respiratory failure 2nd to COPD, OSA/OHS, cor pulmonale.  S/p Trach revision. P:   -wean to trach collar as tolerated -f/u CXR -continue BD's and pulmicort -trach care per ENT -speech to assess for passy muir valve  CARDIOVASCULAR A: Hx of HTN, hyperlipidemia P:  -continue norvasc, ASA, lipitor, coreg,  catapres, plavix, hydralazine, imdur  RENAL A: Stage IV CKD. P:   -monitor renal fx, urine outpt, electrolytes  GASTROINTESTINAL A: Nutrition. Gastroparesis. Dysphagia. P:   -speech to assess swallow once she is off vent -continue reglan -protonix for SUP  HEMATOLOGIC A: Anemia of chronic disease. P:  -f/u CBC -continue FeSO4  INFECTIOUS A: No evidence for infection. P:   -monitor clinically  ENDOCRINE A: DM type II. P:   -SSI  NEUROLOGIC A: Pain control. Hx of Depression, CVA. P:   -continue zoloft, buspar -prn tylenol, ativan, morphine  Will change to PCCM service while pt in ICU.  CC time 35 minutes.  Coralyn Helling, MD [facility] Pulmonary/Critical Care 09/02/2012, 7:36 AM Pager:  [phone] After 3pm call: [phone]

## 2012-09-03 ENCOUNTER — Inpatient Hospital Stay (HOSPITAL_COMMUNITY): Payer: Medicare Other

## 2012-09-03 LAB — CBC
MCV: 83.2 fL (ref 78.0–100.0)
Platelets: 208 10*3/uL (ref 150–400)
RBC: 3.21 MIL/uL — ABNORMAL LOW (ref 3.87–5.11)
RDW: 19.1 % — ABNORMAL HIGH (ref 11.5–15.5)
WBC: 13 10*3/uL — ABNORMAL HIGH (ref 4.0–10.5)

## 2012-09-03 LAB — BASIC METABOLIC PANEL
CO2: 28 mEq/L (ref 19–32)
Chloride: 106 mEq/L (ref 96–112)
Creatinine, Ser: 2.25 mg/dL — ABNORMAL HIGH (ref 0.50–1.10)
GFR calc Af Amer: 24 mL/min — ABNORMAL LOW (ref >90)
Potassium: 4.6 mEq/L (ref 3.5–5.1)
Sodium: 140 mEq/L (ref 135–145)

## 2012-09-03 LAB — GLUCOSE, CAPILLARY
Glucose-Capillary: 74 mg/dL (ref 70–99)
Glucose-Capillary: 78 mg/dL (ref 70–99)
Glucose-Capillary: 87 mg/dL (ref 70–99)
Glucose-Capillary: 96 mg/dL (ref 70–99)

## 2012-09-03 MED ORDER — CHLORHEXIDINE GLUCONATE 0.12 % MT SOLN
OROMUCOSAL | Status: AC
  Administered 2012-09-03: 15 mL
  Filled 2012-09-03: qty 15

## 2012-09-03 NOTE — Progress Notes (Signed)
PULMONARY  / CRITICAL CARE MEDICINE  Name: [name] MRN: [id number] DOB: 07/17/1940    ADMISSION DATE:  08/20/2012 CONSULTATION DATE:  08/29/2012  REFERRING MD :  [facility]  CHIEF COMPLAINT: Dyspnea.  BRIEF PATIENT DESCRIPTION:  72 yo female s/p trach after cardiac arrest December 2013 present with dyspnea 2nd to CHF, COPD, cor pulmonale, OSA/OHS.  Initial trach by Dr. Tyson Alias 11/23/11.  Repeat trach by Dr. Pollyann Kennedy 02/14/12.  PCCM asked to assess for trach malposition.  SIGNIFICANT EVENTS: 7/08 Trach malposition >> left out 7/09 Palliative care consult >> full code, pt declined CPAP  7/10 ENT consult 7/11 Trach revision  STUDIES:   LINES / TUBES: #8 Trach (Dr. Pollyann Kennedy) 7/11 >>   SUBJECTIVE:  Feels hungry.  Did not need vent overnight.  VITAL SIGNS: Temp:  [97.9 F (36.6 C)-99 F (37.2 C)] 98.7 F (37.1 C) (07/13 0746) Pulse Rate:  [60-93] 69 (07/13 0600) Resp:  [14-28] 21 (07/13 0700) BP: (99-184)/(49-84) 184/76 mmHg (07/13 0700) SpO2:  [94 %-100 %] 94 % (07/13 0600) FiO2 (%):  [28 %-40 %] 28 % (07/13 0411) Weight:  [176 lb 12.9 oz (80.2 kg)] 176 lb 12.9 oz (80.2 kg) (07/13 0300) TC 28%  INTAKE / OUTPUT: Intake/Output     07/12 0701 - 07/13 0700 07/13 0701 - 07/14 0700   I.V. (mL/kg) 690 (8.6)    Total Intake(mL/kg) 690 (8.6)    Urine (mL/kg/hr) 2250 (1.2)    Stool     Blood     Total Output 2250     Net -1560            PHYSICAL EXAMINATION: General: No distress Neuro:  RASS 0, normal strength HEENT:  Trach site clean Cardiovascular:  regular Lungs:  No wheeze Abdomen:  Soft, non tender Musculoskeletal:  No edema Skin:  No rashes  LABS: CBC Recent Labs     09/02/12  0345  09/03/12  0345  WBC  11.5*  13.0*  HGB  8.0*  8.3*  HCT  25.4*  26.7*  PLT  198  208   BMET Recent Labs     09/01/12  0637  09/02/12  0345  09/03/12  0345  NA  140  140  140  K  4.8  4.9  4.6  CL  101  103  106  CO2  29  29  28   BUN  80*  70*  56*   CREATININE  2.94*  2.49*  2.25*  GLUCOSE  80  88  84    Electrolytes Recent Labs     09/01/12  0637  09/02/12  0345  09/03/12  0345  CALCIUM  8.7  8.8  9.0   ABG Recent Labs     09/01/12  1757  PHART  7.445  PCO2ART  44.4  PO2ART  95.0   Glucose Recent Labs     09/02/12  0756  09/02/12  1219  09/02/12  1605  09/02/12  2017  09/02/12  2359  09/03/12  0414  GLUCAP  86  81  84  81  87  96    Imaging No results found.    ASSESSMENT / PLAN:  PULMONARY A: Acute on chronic respiratory failure 2nd to COPD, OSA/OHS, cor pulmonale, chronic Lt hemidiaphragm elevation.  S/p Trach revision. P:   -continue trach collar -d/c vent -f/u CXR intermittently -continue BD's and pulmicort -trach care per ENT -speech to assess for passy muir valve  CARDIOVASCULAR A: Hx of HTN, hyperlipidemia  P:  -resume when able to swallow norvasc, ASA, lipitor, coreg, catapres, plavix, hydralazine, imdur  RENAL A: Stage IV CKD. P:   -monitor renal fx, urine outpt, electrolytes  GASTROINTESTINAL A: Nutrition. Gastroparesis. Dysphagia. P:   -speech to assess swallow -continue reglan -protonix for SUP  HEMATOLOGIC A: Anemia of chronic disease. P:  -f/u CBC -continue FeSO4  INFECTIOUS A: No evidence for infection. P:   -monitor clinically  ENDOCRINE A: DM type II. P:   -SSI  NEUROLOGIC A: Pain control. Hx of Depression, CVA. P:   -continue zoloft, buspar -prn tylenol, ativan, morphine  Transfer to SDU.  Awaiting speech assessment for swallow evaluation prior to resuming feeds and oral meds.  Coralyn Helling, MD [facility] Pulmonary/Critical Care 09/03/2012, 7:56 AM Pager:  [phone] After 3pm call: [phone]

## 2012-09-03 NOTE — Evaluation (Signed)
Passy-Muir Speaking Valve - Evaluation Patient Details  Name: [name] MRN: [id number] Date of Birth: 1940/07/09  Today's Date: 09/03/2012 Time: 4098-1191 SLP Time Calculation (min): 28 min  Past Medical History:  Past Medical History  Diagnosis Date  . COPD (chronic obstructive pulmonary disease)     on home o2  . Chronic renal insufficiency   . CVA (cerebral infarction)     hx  . Hypertensive heart disease with congestive heart failure   . Chronic cor pulmonale   . Cardiomyopathy of undetermined type 09/07/2011  . Chronic kidney disease (CKD), stage IV (severe)   . Hyperlipdemia   . Obesity (BMI 30-39.9)   . Sleep apnea   . History of gout 09/07/2011  . Heart failure 7/13  . Shortness of breath   . Anemia   . Vascular disease   . Asthma   . Anginal pain   . Hypertension   . Type II or unspecified type diabetes mellitus without mention of complication, not stated as uncontrolled   . Stroke   . GERD (gastroesophageal reflux disease)   . Arthritis   . Bundle branch block   . Thrombocytopenia   . Acute and chronic respiratory failure   . Obesity, unspecified 08/28/2012   Past Surgical History:  Past Surgical History  Procedure Laterality Date  . Bilateral oophorectomy    . Cardiac catheterization      right heart cath  . Laparoscopic gastrostomy  12/21/2011    Procedure: LAPAROSCOPIC GASTROSTOMY;  Surgeon: Axel Filler, MD;  Location[facility];  Service: General;  Laterality: N/A;  laparoscopic gastrostomy possible open feeding tube  . Abdominal hysterectomy    . Tracheostomy  11/2011  . Cataracts    . Tracheostomy tube placement  02/18/2012    Procedure: TRACHEOSTOMY;  Surgeon: Serena Colonel, MD;  Location: [facility] OR;  Service: ENT;  Laterality: N/A;   HPI:  72 y.o. female w/ h/o cardio pulmonary arrest in 01/2012 , s/p trach, s/p recent admission for acute on chronic respiratory failure from 07/08/2012-07/17/2012. The patient has a very competent medical history  including COPD / OSA / prev SNF pt that had hypercarbic respiratory failure and had issues of recurrent intubation That underwent percutaneous tracheostomy 11/23/11 by Dr. Tyson Alias Had trach dislodgement 01/2012 w/ track closure. Refused trach -redo but then suffered asystolic cardiac arrest. She was recently seen in the emergency department on 07/29/2012 with dislodgment of her tracheostomy tube. It was reinserted and the patient was sent home. The patient presents today with two-day history of worsening shortness of breath. The patient endorses compliance with cleaning her Passy-Muir valve and suctioning with which she has had compliance issues with in the past leading to chronic mucous plugging and respiratory distress.    Assessment / Plan / Recommendation Clinical Impression  Pt. seen for PMSV assessment.  Visible increased work of breathing at rest although respirations 20.  Pt. continued with gurly secretions and unable to effectively mobilize, therefore RN performed deep suctioning.  Initially valve donned for 2-5 seconds before exhaled air pushing valve off trach hub.  Eventually at end of session vavle donned for 4-5 minutes.  Decreased respiratory support to sustain mean length of utterance longer than 2-3 words and SLP provided max therapeutic techniques for increased breath support and coordination.  HR and SP02 remained stable.  RR flucuated 22-36 (at peak).  Recommend pt. wear valve with SLP only at present time.  Pt. very disappointed that she is not quite ready to assess swallow  function today.  SLP will return tomorrow for PMVS treatment and suspect that she will be appropriate for swallow evaluation if she progresses with valve.  If oral cavity cleaned and if okay with MD, recommend several ice chips (afther thorough oral care).  SLP will follow up tomorrow.     SLP Assessment  Patient needs continued Speech Lanaguage Pathology Services    Follow Up Recommendations   (TBD)    Frequency  and Duration min 2x/week  2 weeks   Pertinent Vitals/Pain none    SLP Goals Potential to Achieve Goals: Good SLP Goal #1: Pt. will increase intelligibility utilizing speech strategies wearing PMSV with RR, HR and Sp02 stable with min verbal/visual cues.   PMSV Trial  PMSV was placed for: intervals of 5 seconds to 4-5 minutes Able to redirect subglottic air through upper airway: Yes Able to Attain Phonation: Yes Voice Quality: Hoarse;Low vocal intensity Able to Expectorate Secretions: Yes Level of Secretion Expectoration with PMSV: Tracheal (minimal) Breath Support for Phonation: Severely decreased Intelligibility: Intelligibility reduced Word: 75-100% accurate Phrase: 75-100% accurate Respirations During Trial:  (range 17-33) SpO2 During Trial: 97 % Pulse During Trial: 71   Tracheostomy Tube       Vent Dependency  FiO2 (%): 28 %    Cuff Deflation Trial Tolerated Cuff Deflation: Yes Length of Time for Cuff Deflation Trial: 25 min Behavior: Alert;Responsive to questions   Royce Macadamia M.Ed [employer] [phone]  09/03/2012

## 2012-09-03 NOTE — Progress Notes (Signed)
Patient EA:[id number] [name]      DOB: Oct 29, 1940      BJY:[id number]  Chart reviewed.  Palliative Shadowing along with you. Goals being moved forward.   Alya Smaltz L. Ladona Ridgel, MD MBA The Palliative Medicine Team at [facility] Phone: [phone] Pager: [phone]

## 2012-09-04 DIAGNOSIS — Z930 Tracheostomy status: Secondary | ICD-10-CM

## 2012-09-04 LAB — BASIC METABOLIC PANEL
BUN: 54 mg/dL — ABNORMAL HIGH (ref 6–23)
Chloride: 108 mEq/L (ref 96–112)
Creatinine, Ser: 2.41 mg/dL — ABNORMAL HIGH (ref 0.50–1.10)
GFR calc Af Amer: 22 mL/min — ABNORMAL LOW (ref >90)
Glucose, Bld: 76 mg/dL (ref 70–99)

## 2012-09-04 LAB — CBC
HCT: 26.7 % — ABNORMAL LOW (ref 36.0–46.0)
Hemoglobin: 8.1 g/dL — ABNORMAL LOW (ref 12.0–15.0)
MCH: 25.5 pg — ABNORMAL LOW (ref 26.0–34.0)
MCHC: 30.3 g/dL (ref 30.0–36.0)
MCV: 84 fL (ref 78.0–100.0)
RDW: 19.1 % — ABNORMAL HIGH (ref 11.5–15.5)

## 2012-09-04 LAB — GLUCOSE, CAPILLARY
Glucose-Capillary: 125 mg/dL — ABNORMAL HIGH (ref 70–99)
Glucose-Capillary: 131 mg/dL — ABNORMAL HIGH (ref 70–99)
Glucose-Capillary: 135 mg/dL — ABNORMAL HIGH (ref 70–99)
Glucose-Capillary: 83 mg/dL (ref 70–99)
Glucose-Capillary: 99 mg/dL (ref 70–99)

## 2012-09-04 NOTE — Progress Notes (Addendum)
PULMONARY  / CRITICAL CARE MEDICINE  Name: [name] MRN: [id number] DOB: [birth year]    ADMISSION DATE:  08/20/2012 CONSULTATION DATE:  08/29/2012  REFERRING MD :  [facility]  CHIEF COMPLAINT: Dyspnea.  BRIEF PATIENT DESCRIPTION:  72 yo female s/p trach after cardiac arrest December 2013 present with dyspnea 2nd to CHF, COPD, cor pulmonale, OSA/OHS.  Initial trach by Dr. Tyson Alias 11/23/11.  Repeat trach by Dr. Pollyann Kennedy 02/14/12.  PCCM asked to assess for trach malposition. Trach tube could not be replaced  SIGNIFICANT EVENTS: 7/08 Trach malposition >> left out 7/09 Palliative care consult >> full code, pt declined CPAP  7/10 ENT consult 7/11 Trach revision > ICU afterwards 7/13 SDU transfer 7/14 SLP eval: Dysphagia 2 (Fine chop);Thin liquid  7/14 Med-surg transfer  STUDIES:   LINES / TUBES: #8 Trach (Dr. Pollyann Kennedy) 7/11 >>   SUBJECTIVE:  No distress. No new complaints. SLP eval performed  VITAL SIGNS: Temp:  [98.2 F (36.8 C)-99.3 F (37.4 C)] 98.6 F (37 C) (07/14 1456) Pulse Rate:  [65-95] 81 (07/14 1457) Resp:  [20-32] 22 (07/14 1457) BP: (149-196)/(47-114) 190/73 mmHg (07/14 1456) SpO2:  [93 %-100 %] 99 % (07/14 1457) FiO2 (%):  [28 %-35 %] 28 % (07/14 1457) Weight:  [77 kg (169 lb 12.1 oz)] 77 kg (169 lb 12.1 oz) (07/14 0403) TC 28%  INTAKE / OUTPUT: Intake/Output     07/13 0701 - 07/14 0700 07/14 0701 - 07/15 0700   I.V. (mL/kg) 540 (7)    Total Intake(mL/kg) 540 (7)    Urine (mL/kg/hr) 1000 (0.5) 600 (0.9)   Total Output 1000 600   Net -460 -600          PHYSICAL EXAMINATION: General: No distress Neuro:  RASS 0, normal strength HEENT:  Trach site clean Cardiovascular:  regular Lungs:  No wheeze Abdomen:  Soft, non tender Musculoskeletal:  No edema Skin:  No rashes  LABS: CBC Recent Labs     09/02/12  0345  09/03/12  0345  09/04/12  0420  WBC  11.5*  13.0*  11.7*  HGB  8.0*  8.3*  8.1*  HCT  25.4*  26.7*  26.7*  PLT  198  208  201    BMET Recent Labs     09/02/12  0345  09/03/12  0345  09/04/12  0420  NA  140  140  143  K  4.9  4.6  4.4  CL  103  106  108  CO2  29  28  26   BUN  70*  56*  54*  CREATININE  2.49*  2.25*  2.41*  GLUCOSE  88  84  76    Electrolytes Recent Labs     09/02/12  0345  09/03/12  0345  09/04/12  0420  CALCIUM  8.8  9.0  9.0   ABG Recent Labs     09/01/12  1757  PHART  7.445  PCO2ART  44.4  PO2ART  95.0   Glucose Recent Labs     09/03/12  1605  09/03/12  1956  09/04/12  0043  09/04/12  0425  09/04/12  0850  09/04/12  1126  GLUCAP  77  75  71  75  83  99    Imaging No new CXR    ASSESSMENT / PLAN:  PULMONARY A: Acute on chronic respiratory failure 2nd to COPD, OSA/OHS, chronic Lt hemidiaphragm elevation.   S/p Trach revision. Tracheostomy dependent P:   -continue ATC as tolerated -  continue BD's and pulmicort -trach care per ENT -This is a permanent trach tube  CARDIOVASCULAR A: Hx of HTN, hyperlipidemia P:  -Cont prior regimen  RENAL A: Stage IV CKD. P:   -monitor renal fx, urine outpt, electrolytes  GASTROINTESTINAL A: Nutrition. Gastroparesis. Dysphagia. P:   -Diet per SLP recs  HEMATOLOGIC A: Anemia of chronic disease. P:  -f/u CBC -continue FeSO4  INFECTIOUS A: No evidence for infection. P:   -monitor clinically  ENDOCRINE A: DM type II. P:   -Cont SSI  NEUROLOGIC A: Pain control. Hx of Depression, CVA. P:   -continue zoloft, buspar -prn tylenol, ativan, morphine  Transfer to med-surg. [facility] to resume primary care duties as of 7/15 AM and PCCM to sign off. Discussed with Dr Nichola Sizer, MD ; [facility] [phone].  After 5:30 PM or weekends, call [phone]

## 2012-09-04 NOTE — Progress Notes (Signed)
Report called to 6N to Edith Endave, [place]

## 2012-09-04 NOTE — Evaluation (Signed)
Clinical/Bedside Swallow Evaluation Patient Details  Name: [name] MRN: [id number] Date of Birth: Sep 10, 1940  Today's Date: 09/04/2012 Time: 1010-1028 SLP Time Calculation (min): 18 min  Past Medical History:  Past Medical History  Diagnosis Date  . COPD (chronic obstructive pulmonary disease)     on home o2  . Chronic renal insufficiency   . CVA (cerebral infarction)     hx  . Hypertensive heart disease with congestive heart failure   . Chronic cor pulmonale   . Cardiomyopathy of undetermined type 09/07/2011  . Chronic kidney disease (CKD), stage IV (severe)   . Hyperlipdemia   . Obesity (BMI 30-39.9)   . Sleep apnea   . History of gout 09/07/2011  . Heart failure 7/13  . Shortness of breath   . Anemia   . Vascular disease   . Asthma   . Anginal pain   . Hypertension   . Type II or unspecified type diabetes mellitus without mention of complication, not stated as uncontrolled   . Stroke   . GERD (gastroesophageal reflux disease)   . Arthritis   . Bundle branch block   . Thrombocytopenia   . Acute and chronic respiratory failure   . Obesity, unspecified 08/28/2012   Past Surgical History:  Past Surgical History  Procedure Laterality Date  . Bilateral oophorectomy    . Cardiac catheterization      right heart cath  . Laparoscopic gastrostomy  12/21/2011    Procedure: LAPAROSCOPIC GASTROSTOMY;  Surgeon: Axel Filler, MD;  Location: [facility] OR;  Service: General;  Laterality: N/A;  laparoscopic gastrostomy possible open feeding tube  . Abdominal hysterectomy    . Tracheostomy  11/2011  . Cataracts    . Tracheostomy tube placement  02/18/2012    Procedure: TRACHEOSTOMY;  Surgeon: Serena Colonel, MD;  Location: [facility] OR;  Service: ENT;  Laterality: N/A;   HPI:  72 y.o. female w/ h/o cardio pulmonary arrest in 01/2012 , s/p trach, s/p recent admission for acute on chronic respiratory failure from 07/08/2012-07/17/2012. The patient has a very competent medical history  including COPD / OSA / prev SNF pt that had hypercarbic respiratory failure and had issues of recurrent intubation That underwent percutaneous tracheostomy 11/23/11 by Dr. Tyson Alias Had trach dislodgement 01/2012 w/ track closure. Refused trach -redo but then suffered asystolic cardiac arrest. She was recently seen in the emergency department on 07/29/2012 with dislodgment of her tracheostomy tube. It was reinserted and the patient was sent home. The patient presents today with two-day history of worsening shortness of breath. The patient endorses compliance with cleaning her Passy-Muir valve and suctioning with which she has had compliance issues with in the past leading to chronic mucous plugging and respiratory distress.  MBS 11/20013 recommended Dys 3 diet texture and thin liquids.    Assessment / Plan / Recommendation Clinical Impression  Pt. seen for swallow assessment following PMSV treatment.  Valve donned with mild oral dysphagia observed and mildly delayed manipulation and transit.  Pharyngeal phase appeared within functional limits with O2 sats stable and respiratory rate fluctuating 20-30.  She exhibited slightly increased work of breathing from her baseline.  SLP recommends Dys 2 diet texture and thin liquids, PMSV donned with all meals/snacks, no straws, pills whole in applesauce and rest breaks as needed.  ST will follow.        Aspiration Risk  Moderate    Diet Recommendation Dysphagia 2 (Fine chop);Thin liquid   Liquid Administration via: Cup;No straw Medication Administration: Whole meds  with puree Supervision: Patient able to self feed;Full supervision/cueing for compensatory strategies Compensations:  (PMSV with all po's) Postural Changes and/or Swallow Maneuvers: Seated upright 90 degrees    Other  Recommendations Oral Care Recommendations: Oral care BID Other Recommendations: Place PMSV during PO intake   Follow Up Recommendations   (TBD)    Frequency and Duration min  2x/week  2 weeks   Pertinent Vitals/Pain none    SLP Swallow Goals Patient will utilize recommended strategies during swallow to increase swallowing safety with: Modified independent assistance   Swallow Study         Oral/Motor/Sensory Function Overall Oral Motor/Sensory Function: Appears within functional limits for tasks assessed   Ice Chips Ice chips: Within functional limits   Thin Liquid Thin Liquid: Within functional limits    Nectar Thick Nectar Thick Liquid: Not tested   Honey Thick Honey Thick Liquid: Not tested   Puree Puree: Within functional limits   Solid       Solid: Impaired Oral Phase Impairments: Reduced lingual movement/coordination Oral Phase Functional Implications:  (mild oral delays)       Darrow Bussing.Ed [employer] [phone]  09/04/2012

## 2012-09-04 NOTE — Progress Notes (Signed)
Passy-Muir Speaking Valve - Treatment Patient Details  Name: [name] MRN: [id number] Date of Birth: Dec 23, 1940  Today's Date: 09/04/2012 Time: 1478-2956 SLP Time Calculation (min): 37 min  Past Medical History:  Past Medical History  Diagnosis Date  . COPD (chronic obstructive pulmonary disease)     on home o2  . Chronic renal insufficiency   . CVA (cerebral infarction)     hx  . Hypertensive heart disease with congestive heart failure   . Chronic cor pulmonale   . Cardiomyopathy of undetermined type 09/07/2011  . Chronic kidney disease (CKD), stage IV (severe)   . Hyperlipdemia   . Obesity (BMI 30-39.9)   . Sleep apnea   . History of gout 09/07/2011  . Heart failure 7/13  . Shortness of breath   . Anemia   . Vascular disease   . Asthma   . Anginal pain   . Hypertension   . Type II or unspecified type diabetes mellitus without mention of complication, not stated as uncontrolled   . Stroke   . GERD (gastroesophageal reflux disease)   . Arthritis   . Bundle branch block   . Thrombocytopenia   . Acute and chronic respiratory failure   . Obesity, unspecified 08/28/2012   Past Surgical History:  Past Surgical History  Procedure Laterality Date  . Bilateral oophorectomy    . Cardiac catheterization      right heart cath  . Laparoscopic gastrostomy  12/21/2011    Procedure: LAPAROSCOPIC GASTROSTOMY;  Surgeon: Axel Filler, MD;  Location: [facility] OR;  Service: General;  Laterality: N/A;  laparoscopic gastrostomy possible open feeding tube  . Abdominal hysterectomy    . Tracheostomy  11/2011  . Cataracts    . Tracheostomy tube placement  02/18/2012    Procedure: TRACHEOSTOMY;  Surgeon: Serena Colonel, MD;  Location: [facility] OR;  Service: ENT;  Laterality: N/A;    Assessment / Plan / Recommendation Clinical Impression  Improvements with PMSV today.  SLP noted cuff to be deflated on arrival and decreased presence of copious secretions although continues to be bloody.  Valve  fell from trach hub after intial 2 attempts caused by exhaled air.  Valve remained in place throughout remainder of session for 25 minutes (except 1-2 times).  Detection of possible Co2 retention x 1 when PMSV removed early in session with no additional indications during remainder of treatment.  Vocal quality hoarse with mildly decreased intensity and required moderate verbal and visual cues to coordinate respirations and phonations.  Able to produce approximately 3-4 words per breath.  Pt. is chronic trach pt. and is used to donning and doffing valve.  Upon attempt to doff valve, she pulled inner cannula almost out (even with other hand holding trach).  Recommend she wear valve with full supervision with RN, SLP initially.  ST will continue to follow.       Plan  Continue with current plan of care    Follow Up Recommendations   (TBD)    Pertinent Vitals/Pain none    SLP Goals Potential to Achieve Goals: Good Progress/Goals/Alternative treatment plan discussed with pt/caregiver and they: Agree SLP Goal #1: Pt. will increase intelligibility utilizing speech strategies wearing PMSV with RR, HR and Sp02 stable with min verbal/visual cues. SLP Goal #1 - Progress: Progressing toward goal   PMSV Trial  PMSV was placed for: 30 min Able to redirect subglottic air through upper airway: Yes Able to Attain Phonation: Yes Voice Quality: Hoarse;Low vocal intensity Able to Expectorate Secretions:  Yes Level of Secretion Expectoration with PMSV: Tracheal Breath Support for Phonation: Moderately decreased Intelligibility: Intelligibility reduced Word: 75-100% accurate Phrase: 75-100% accurate Sentence: 75-100% accurate Respirations During Trial:  (fluctuated 20-30) SpO2 During Trial:  (97%-100%) Pulse During Trial: 78   Tracheostomy Tube       Vent Dependency  FiO2 (%): 28 %    Cuff Deflation Trial  GO     Tolerated Cuff Deflation:  (n/a cuff deflated upon SLP arrival) Behavior:  Alert;Cooperative;Expresses self well;Responsive to questions   Royce Macadamia M.Ed [employer] [phone]  09/04/2012

## 2012-09-05 ENCOUNTER — Encounter (HOSPITAL_COMMUNITY): Payer: Self-pay | Admitting: Otolaryngology

## 2012-09-05 DIAGNOSIS — N289 Disorder of kidney and ureter, unspecified: Secondary | ICD-10-CM

## 2012-09-05 DIAGNOSIS — G9340 Encephalopathy, unspecified: Secondary | ICD-10-CM

## 2012-09-05 LAB — GLUCOSE, CAPILLARY
Glucose-Capillary: 130 mg/dL — ABNORMAL HIGH (ref 70–99)
Glucose-Capillary: 144 mg/dL — ABNORMAL HIGH (ref 70–99)
Glucose-Capillary: 97 mg/dL (ref 70–99)

## 2012-09-05 NOTE — Progress Notes (Signed)
Thank you to Dr Jacky Kindle for this referral via the long length of stay meeting.  Patient evaluated for community based chronic disease management services with [facility] Care Management Program as a benefit of patient's [employer].  Patient will receive a post discharge transition of care call and will be evaluated for monthly home visits for assessments and disease process education. Spoke with patient's daughter/POA Sunday Corn by phone at bedside to explain [facility] Care Management services.  Patient has been previously engaged at home without success.  We were unable to establish consistent contact with the patient/family.  Left contact information and [facility] literature at bedside in night stand for daughter to obtain as agreed.  Verbal consent obtained.  Daughter confirmed that the listed number in EPIC was the best number for contact.  Made inpatient Case Manager aware that [facility] Care Management following. Of note, [facility] Care Management services does not replace or interfere with any services that are arranged by inpatient case management or social work.  For additional questions or referrals please contact Anibal Henderson BSN RN [facility] Liaison at [phone].

## 2012-09-05 NOTE — Progress Notes (Signed)
Speech Language Pathology Dysphagia and PMSV Treatment Patient Details Name: [name] MRN: [id number] DOB: Apr 05, 1940 Today's Date: 09/05/2012 Time: 1210-     Assessment / Plan / Recommendation Clinical Impression  Pt. seen for PMSV and dysphagia treat.  RN performing trach care as SLP entered room.  Pt. with slightly increased WOB.  Valve donned and she was able to mobilize secretions to oral cavity.  PMSV came off trach hub x 2 then remained in place for remainder of session.  SP02 remained 98-100% with min cues needed for respiratory and phonatory coordination.  No signs of Co2 retnetion when valve removed.  No s/s aspiraiton with cup sips thin OJ..  ST will continue.    Diet Recommendation  Continue with Current Diet: Dysphagia 2 (fine chop);Thin liquid    SLP Plan Continue with current plan of care   Pertinent Vitals/Pain none   Swallowing Goals  SLP Swallowing Goals Patient will utilize recommended strategies during swallow to increase swallowing safety with: Modified independent assistance Swallow Study Goal #2 - Progress: Progressing toward goal  General Temperature Spikes Noted: No Respiratory Status: Trach Behavior/Cognition: Alert;Cooperative Oral Cavity - Dentition: Edentulous Patient Positioning: Upright in bed  Oral Cavity - Oral Hygiene Does patient have any of the following "at risk" factors?: Oxygen therapy - cannula, mask, simple oxygen devices Brush patient's teeth BID with toothbrush (using toothpaste with fluoride): Yes Patient is HIGH RISK - Oral Care Protocol followed (see row info): Yes Patient is AT RISK - Oral Care Protocol followed (see row info): Yes   Dysphagia Treatment Treatment focused on: Skilled observation of diet tolerance Treatment Methods/Modalities: Skilled observation Patient observed directly with PO's: Yes Type of PO's observed: Thin liquids Feeding: Able to feed self Liquids provided via: Cup;No straw Type of cueing:  Verbal Amount of cueing: Minimal   GO     Royce Macadamia M.Ed [employer] [phone]  09/05/2012

## 2012-09-05 NOTE — Progress Notes (Signed)
[facility] PROGRESS NOTE  [name] YQM:[id number] DOB: April 29, 1940 DOA: 08/20/2012 PCP: Gwynneth Aliment, MD  Assessment/Plan:  Acute on chronic respiratory failure 2nd to COPD, OSA/OHS, chronic Lt hemidiaphragm elevation.  S/p Trach revision.  Tracheostomy dependent  P:  -continue ATC as tolerated  -continue BD's and pulmicort  -trach care per ENT  -This is a permanent trach tube   A: Hx of HTN, hyperlipidemia  P:  -Cont prior regimen    A: Stage IV CKD.  P:  -monitor renal fx, urine outpt, electrolytes    Dysphagia.  P:  -Diet per SLP recs    A: Anemia of chronic disease.  P:  -f/u CBC  -continue FeSO4   A: No evidence for infection.  P:  -monitor clinically   A: DM type II.  P:  -Cont SSI    Hx of Depression, CVA.  P:  -continue zoloft, buspar   PT/Ot eval -? From home     Code Status: full Family Communication: patient Disposition Plan:    BRIEF PATIENT DESCRIPTION:  72 yo female s/p trach after cardiac arrest December 2013 present with dyspnea 2nd to CHF, COPD, cor pulmonale, OSA/OHS. Initial trach by Dr. Tyson Alias 11/23/11. Repeat trach by Dr. Pollyann Kennedy 02/14/12. PCCM asked to assess for trach malposition. Trach tube could not be replaced   SIGNIFICANT EVENTS:  7/08 Trach malposition >> left out  7/09 Palliative care consult >> full code, pt declined CPAP  7/10 ENT consult  7/11 Trach revision > ICU afterwards  7/13 SDU transfer  7/14 SLP eval: Dysphagia 2 (Fine chop);Thin liquid  7/14 Med-surg transfer   STUDIES:  LINES / TUBES:  #8 Trach (Dr. Pollyann Kennedy) 7/11 >>      HPI/Subjective: Does not want to get up, says her trach will bleed  Objective: Filed Vitals:   09/05/12 0300 09/05/12 0519 09/05/12 0754 09/05/12 0947  BP:  180/76  167/67  Pulse: 93 67 98 64  Temp:  97.7 F (36.5 C)  98 F (36.7 C)  TempSrc:  Oral  Axillary  Resp: 20 20 24  36  Height:      Weight:      SpO2: 98% 94% 98%     Intake/Output  Summary (Last 24 hours) at 09/05/12 1113 Last data filed at 09/05/12 0949  Gross per 24 hour  Intake    120 ml  Output    600 ml  Net   -480 ml   Filed Weights   09/01/12 0623 09/03/12 0300 09/04/12 0403  Weight: 83.689 kg (184 lb 8 oz) 80.2 kg (176 lb 12.9 oz) 77 kg (169 lb 12.1 oz)    Exam:   General:  Awake- difficult to understand  Cardiovascular: rrr  Respiratory: coarse BS b/l  Abdomen: +Bs, soft  Musculoskeletal: moves all 4 ext  Data Reviewed: Basic Metabolic Panel:  Recent Labs Lab 08/31/12 0450 09/01/12 0637 09/02/12 0345 09/03/12 0345 09/04/12 0420  NA 138 140 140 140 143  K 5.1 4.8 4.9 4.6 4.4  CL 100 101 103 106 108  CO2 29 29 29 28 26   GLUCOSE 112* 80 88 84 76  BUN 88* 80* 70* 56* 54*  CREATININE 3.00* 2.94* 2.49* 2.25* 2.41*  CALCIUM 8.7 8.7 8.8 9.0 9.0   Liver Function Tests: No results found for this basename: AST, ALT, ALKPHOS, BILITOT, PROT, ALBUMIN,  in the last 168 hours No results found for this basename: LIPASE, AMYLASE,  in the last 168 hours No results found for this basename:  AMMONIA,  in the last 168 hours CBC:  Recent Labs Lab 09/02/12 0345 09/03/12 0345 09/04/12 0420  WBC 11.5* 13.0* 11.7*  HGB 8.0* 8.3* 8.1*  HCT 25.4* 26.7* 26.7*  MCV 81.9 83.2 84.0  PLT 198 208 201   Cardiac Enzymes: No results found for this basename: CKTOTAL, CKMB, CKMBINDEX, TROPONINI,  in the last 168 hours BNP (last 3 results)  Recent Labs  07/08/12 1050 07/09/12 0615 08/20/12 0513  PROBNP 8263.0* 9076.0* 10275.0*   CBG:  Recent Labs Lab 09/04/12 1705 09/04/12 2050 09/04/12 2356 09/05/12 0408 09/05/12 0752  GLUCAP 135* 131* 125* 113* 100*    Recent Results (from the past 240 hour(s))  MRSA PCR SCREENING     Status: None   Collection Time    09/01/12  5:35 PM      Result Value Range Status   MRSA by PCR NEGATIVE  NEGATIVE Final   Comment:            The GeneXpert MRSA Assay (FDA     approved for NASAL specimens     only),  is one component of a     comprehensive MRSA colonization     surveillance program. It is not     intended to diagnose MRSA     infection nor to guide or     monitor treatment for     MRSA infections.     Studies: No results found.  Scheduled Meds: . ipratropium  0.5 mg Nebulization Q6H   And  . albuterol  2.5 mg Nebulization Q6H  . amLODipine  10 mg Oral Daily  . antiseptic oral rinse  15 mL Mouth Rinse Daily  . aspirin  81 mg Oral Daily  . atorvastatin  40 mg Oral Daily  . budesonide  0.25 mg Nebulization Q6H  . busPIRone  5 mg Oral BID  . carvedilol  12.5 mg Oral BID WC  . cloNIDine  0.1 mg Oral BID  . clopidogrel  75 mg Oral Q breakfast  . enoxaparin (LOVENOX) injection  30 mg Subcutaneous Q24H  . ferrous sulfate  325 mg Oral BID  . hydrALAZINE  100 mg Oral TID  . insulin aspart  0-9 Units Subcutaneous Q4H  . isosorbide mononitrate  30 mg Oral Daily  . metoCLOPramide  5 mg Oral QID  . pantoprazole  40 mg Oral Daily  . sertraline  100 mg Oral QHS  . sodium chloride  3 mL Intravenous Q12H  . vitamin C  500 mg Oral Daily   Continuous Infusions:   Active Problems:   Chronic cor pulmonale   C O P D   Chronic kidney disease (CKD), stage IV (severe)   Acute-on-chronic respiratory failure   Tracheostomy status   SEMI (subendocardial myocardial infarction)   Acute on chronic combined systolic and diastolic CHF (congestive heart failure)   Acute on chronic renal failure   Obesity, unspecified   Tracheostomy complication   Palliative care encounter   Weakness generalized    Time spent: 35    [facility], [place] Kranz  [facility] Pager [phone]. If 7PM-7AM, please contact night-coverage at [email or web address], password [facility] 09/05/2012, 11:13 AM  LOS: 16 days

## 2012-09-06 DIAGNOSIS — D649 Anemia, unspecified: Secondary | ICD-10-CM

## 2012-09-06 LAB — BASIC METABOLIC PANEL
BUN: 45 mg/dL — ABNORMAL HIGH (ref 6–23)
Chloride: 108 mEq/L (ref 96–112)
GFR calc non Af Amer: 20 mL/min — ABNORMAL LOW (ref >90)
Glucose, Bld: 112 mg/dL — ABNORMAL HIGH (ref 70–99)
Potassium: 4.4 mEq/L (ref 3.5–5.1)
Sodium: 141 mEq/L (ref 135–145)

## 2012-09-06 LAB — GLUCOSE, CAPILLARY: Glucose-Capillary: 109 mg/dL — ABNORMAL HIGH (ref 70–99)

## 2012-09-06 LAB — CBC
HCT: 24.8 % — ABNORMAL LOW (ref 36.0–46.0)
Hemoglobin: 7.7 g/dL — ABNORMAL LOW (ref 12.0–15.0)
MCHC: 31 g/dL (ref 30.0–36.0)
RBC: 3.01 MIL/uL — ABNORMAL LOW (ref 3.87–5.11)
WBC: 10.3 10*3/uL (ref 4.0–10.5)

## 2012-09-06 NOTE — Progress Notes (Signed)
[facility] PROGRESS NOTE  [name] WJX:[id number] DOB: [birth year] DOA: 08/20/2012 PCP: Gwynneth Aliment, MD  Transfer out of ICU on 7/15.  72 yo female s/p trach after cardiac arrest December 2013 present with dyspnea 2nd to CHF, COPD, cor pulmonale, OSA/OHS. Initial trach by Dr. Tyson Alias 11/23/11. Repeat trach by Dr. Pollyann Kennedy 02/14/12. PCCM asked to assess for trach malposition. Trach tube could not be replaced  Assessment/Plan:  Acute on chronic respiratory failure 2nd to COPD, OSA/OHS, chronic Lt hemidiaphragm elevation.  S/p Trach revision.  Tracheostomy dependent  -continue ATC as tolerated  -continue BD's and pulmicort  -trach care per ENT  -This is a permanent trach tube    Hx of HTN, hyperlipidemia  -Cont prior regimen    Stage IV CKD.   -monitor renal fx, urine outpt, electrolytes  -stable  Dysphagia.   -Diet per SLP recs     Anemia of chronic disease.   -f/u CBC - transfuse if <7 or unstable -continue FeSO4    DM type II.   -Cont SSI    Hx of Depression, CVA.  -continue zoloft, buspar   PT/Ot eval -home in AM ifl abs stable- will probably need PT/OT/RN     Code Status: full Family Communication: patient Disposition Plan:    SIGNIFICANT EVENTS:  7/08 Trach malposition >> left out  7/09 Palliative care consult >> full code, pt declined CPAP  7/10 ENT consult  7/11 Trach revision > ICU afterwards  7/13 SDU transfer  7/14 SLP eval: Dysphagia 2 (Fine chop);Thin liquid  7/14 Med-surg transfer   STUDIES:  LINES / TUBES:  #8 Trach (Dr. Pollyann Kennedy) 7/11 >>      HPI/Subjective: Eating breakfast- no new c/o No SOB  Objective: Filed Vitals:   09/06/12 0125 09/06/12 0324 09/06/12 0434 09/06/12 0755  BP:   163/69   Pulse: 59 64 60 73  Temp:   98.1 F (36.7 C)   TempSrc:   Oral   Resp: 20 20 18 22   Height:      Weight:      SpO2: 100% 100% 100% 100%    Intake/Output Summary (Last 24 hours) at 09/06/12 0815 Last data filed at  09/05/12 2219  Gross per 24 hour  Intake    363 ml  Output      0 ml  Net    363 ml   Filed Weights   09/01/12 0623 09/03/12 0300 09/04/12 0403  Weight: 83.689 kg (184 lb 8 oz) 80.2 kg (176 lb 12.9 oz) 77 kg (169 lb 12.1 oz)    Exam:   General:  Awake- difficult to understand  Cardiovascular: rrr  Respiratory: coarse BS b/l  Abdomen: +Bs, soft  Musculoskeletal: moves all 4 ext  Data Reviewed: Basic Metabolic Panel:  Recent Labs Lab 09/01/12 0637 09/02/12 0345 09/03/12 0345 09/04/12 0420 09/06/12 0440  NA 140 140 140 143 141  K 4.8 4.9 4.6 4.4 4.4  CL 101 103 106 108 108  CO2 29 29 28 26 27   GLUCOSE 80 88 84 76 112*  BUN 80* 70* 56* 54* 45*  CREATININE 2.94* 2.49* 2.25* 2.41* 2.34*  CALCIUM 8.7 8.8 9.0 9.0 8.6   Liver Function Tests: No results found for this basename: AST, ALT, ALKPHOS, BILITOT, PROT, ALBUMIN,  in the last 168 hours No results found for this basename: LIPASE, AMYLASE,  in the last 168 hours No results found for this basename: AMMONIA,  in the last 168 hours CBC:  Recent Labs Lab 09/02/12  0345 09/03/12 0345 09/04/12 0420 09/06/12 0440  WBC 11.5* 13.0* 11.7* 10.3  HGB 8.0* 8.3* 8.1* 7.7*  HCT 25.4* 26.7* 26.7* 24.8*  MCV 81.9 83.2 84.0 82.4  PLT 198 208 201 180   Cardiac Enzymes: No results found for this basename: CKTOTAL, CKMB, CKMBINDEX, TROPONINI,  in the last 168 hours BNP (last 3 results)  Recent Labs  07/08/12 1050 07/09/12 0615 08/20/12 0513  PROBNP 8263.0* 9076.0* 10275.0*   CBG:  Recent Labs Lab 09/05/12 1619 09/05/12 1948 09/05/12 2358 09/06/12 0434 09/06/12 0806  GLUCAP 144* 127* 97 109* 100*    Recent Results (from the past 240 hour(s))  MRSA PCR SCREENING     Status: None   Collection Time    09/01/12  5:35 PM      Result Value Range Status   MRSA by PCR NEGATIVE  NEGATIVE Final   Comment:            The GeneXpert MRSA Assay (FDA     approved for NASAL specimens     only), is one component of a      comprehensive MRSA colonization     surveillance program. It is not     intended to diagnose MRSA     infection nor to guide or     monitor treatment for     MRSA infections.     Studies: No results found.  Scheduled Meds: . ipratropium  0.5 mg Nebulization Q6H   And  . albuterol  2.5 mg Nebulization Q6H  . amLODipine  10 mg Oral Daily  . antiseptic oral rinse  15 mL Mouth Rinse Daily  . aspirin  81 mg Oral Daily  . atorvastatin  40 mg Oral Daily  . budesonide  0.25 mg Nebulization Q6H  . busPIRone  5 mg Oral BID  . carvedilol  12.5 mg Oral BID WC  . cloNIDine  0.1 mg Oral BID  . clopidogrel  75 mg Oral Q breakfast  . enoxaparin (LOVENOX) injection  30 mg Subcutaneous Q24H  . ferrous sulfate  325 mg Oral BID  . hydrALAZINE  100 mg Oral TID  . insulin aspart  0-9 Units Subcutaneous Q4H  . isosorbide mononitrate  30 mg Oral Daily  . metoCLOPramide  5 mg Oral QID  . pantoprazole  40 mg Oral Daily  . sertraline  100 mg Oral QHS  . sodium chloride  3 mL Intravenous Q12H  . vitamin C  500 mg Oral Daily   Continuous Infusions:   Active Problems:   Chronic cor pulmonale   C O P D   Chronic kidney disease (CKD), stage IV (severe)   Acute-on-chronic respiratory failure   Tracheostomy status   SEMI (subendocardial myocardial infarction)   Acute on chronic combined systolic and diastolic CHF (congestive heart failure)   Acute on chronic renal failure   Obesity, unspecified   Tracheostomy complication   Palliative care encounter   Weakness generalized    Time spent: 35    [facility], Tunisha Ruland  [facility] Pager [phone]. If 7PM-7AM, please contact night-coverage at [email or web address], password [facility] 09/06/2012, 8:15 AM  LOS: 17 days

## 2012-09-06 NOTE — Progress Notes (Signed)
OT Cancellation Note  Patient Details Name: [name] MRN: [id number] DOB: Jun 25, 1940   Cancelled Treatment:    Reason Eval/Treat Not Completed: Patient declined, no reason specified - pt adamantly refusing despite max effort.  No reason specified.  Will reattempt.  Jeani Hawking M [phone] 09/06/2012, 12:16 PM

## 2012-09-06 NOTE — Progress Notes (Signed)
PT Cancellation Note  Patient Details Name: [name] MRN: [id number] DOB: 1940/09/16   Cancelled Treatment:    Reason Eval/Treat Not Completed: Patient declined, no reason specified (pt adamently refused).  Will reattempt as able. 09/06/2012  [place] Bing, PT [phone]  (pager)   Alazia Crocket, Eliseo Gum 09/06/2012, 1:54 PM

## 2012-09-06 NOTE — Progress Notes (Signed)
Progress Note from the Palliative Medicine Team at [facility]  Subjective: patient is awake and alert and sitting on side of bed, continued conversation regarding her healthcare wishes, understanding her multiple co-morbiitites  -spoke with daughter Sunday Corn by telephone   Objective: Allergies  Allergen Reactions  . Other Itching    "Wool"  . Sulfonamide Derivatives Hives and Rash   Scheduled Meds: . ipratropium  0.5 mg Nebulization Q6H   And  . albuterol  2.5 mg Nebulization Q6H  . amLODipine  10 mg Oral Daily  . antiseptic oral rinse  15 mL Mouth Rinse Daily  . aspirin  81 mg Oral Daily  . atorvastatin  40 mg Oral Daily  . budesonide  0.25 mg Nebulization Q6H  . busPIRone  5 mg Oral BID  . carvedilol  12.5 mg Oral BID WC  . cloNIDine  0.1 mg Oral BID  . clopidogrel  75 mg Oral Q breakfast  . enoxaparin (LOVENOX) injection  30 mg Subcutaneous Q24H  . ferrous sulfate  325 mg Oral BID  . hydrALAZINE  100 mg Oral TID  . insulin aspart  0-9 Units Subcutaneous Q4H  . isosorbide mononitrate  30 mg Oral Daily  . metoCLOPramide  5 mg Oral QID  . pantoprazole  40 mg Oral Daily  . sertraline  100 mg Oral QHS  . sodium chloride  3 mL Intravenous Q12H  . vitamin C  500 mg Oral Daily   Continuous Infusions:  PRN Meds:.acetaminophen, albuterol, hydrALAZINE, LORazepam, morphine injection, ondansetron (ZOFRAN) IV, oxyCODONE  BP 163/69  Pulse 73  Temp(Src) 98.1 F (36.7 C) (Oral)  Resp 22  Ht 5\' 4"  (1.626 m)  Wt 77 kg (169 lb 12.1 oz)  BMI 29.12 kg/m2  SpO2 100%   PPS: 40 %  Pain Score: denies  Intake/Output Summary (Last 24 hours) at 09/06/12 0936 Last data filed at 09/05/12 2219  Gross per 24 hour  Intake    363 ml  Output      0 ml  Net    363 ml       Physical Exam:  General: chronically ill appearing HEENT:  Trach site clean, minimal redness, o/c tenderness, noted increased secretions Chest:   diminished in bases, CTA CVS: RRR Abdomen: obese, soft NT  +BS Ext: without edema Neuro: alert and oriented X3  Labs: CBC    Component Value Date/Time   WBC 10.3 09/06/2012 0440   RBC 3.01* 09/06/2012 0440   HGB 7.7* 09/06/2012 0440   HCT 24.8* 09/06/2012 0440   PLT 180 09/06/2012 0440   MCV 82.4 09/06/2012 0440   MCH 25.6* 09/06/2012 0440   MCHC 31.0 09/06/2012 0440   RDW 18.9* 09/06/2012 0440   LYMPHSABS 1.6 08/25/2012 0500   MONOABS 1.1* 08/25/2012 0500   EOSABS 0.0 08/25/2012 0500   BASOSABS 0.0 08/25/2012 0500    BMET    Component Value Date/Time   NA 141 09/06/2012 0440   K 4.4 09/06/2012 0440   CL 108 09/06/2012 0440   CO2 27 09/06/2012 0440   GLUCOSE 112* 09/06/2012 0440   BUN 45* 09/06/2012 0440   CREATININE 2.34* 09/06/2012 0440   CALCIUM 8.6 09/06/2012 0440   GFRNONAA 20* 09/06/2012 0440   GFRAA 23* 09/06/2012 0440    CMP     Component Value Date/Time   NA 141 09/06/2012 0440   K 4.4 09/06/2012 0440   CL 108 09/06/2012 0440   CO2 27 09/06/2012 0440   GLUCOSE 112* 09/06/2012 0440  BUN 45* 09/06/2012 0440   CREATININE 2.34* 09/06/2012 0440   CALCIUM 8.6 09/06/2012 0440   PROT 5.7* 07/09/2012 0615   ALBUMIN 2.7* 07/09/2012 0615   AST 30 07/09/2012 0615   ALT 7 07/09/2012 0615   ALKPHOS 66 07/09/2012 0615   BILITOT 0.2* 07/09/2012 0615   GFRNONAA 20* 09/06/2012 0440   GFRAA 23* 09/06/2012 0440     Assessment and Plan: 1. Code Status:  Full code 2. Symptom Control:            Weakness: encouraged participation with PT and self exercises.  Importance of self care and responsibility in her own wellness discussed.  3. Psycho/Social:  Empathy and support to a difficult situation medically. 4. Disposition:  Plan per daughter is home with previous care plan in place.   Lorinda Creed NP  Palliative Medicine Team Team Phone # [phone] Pager [phone]   1

## 2012-09-07 ENCOUNTER — Encounter: Payer: Medicare Other | Admitting: Cardiovascular Disease

## 2012-09-07 LAB — BASIC METABOLIC PANEL
BUN: 42 mg/dL — ABNORMAL HIGH (ref 6–23)
CO2: 24 mEq/L (ref 19–32)
Glucose, Bld: 101 mg/dL — ABNORMAL HIGH (ref 70–99)
Potassium: 4.7 mEq/L (ref 3.5–5.1)
Sodium: 139 mEq/L (ref 135–145)

## 2012-09-07 LAB — GLUCOSE, CAPILLARY
Glucose-Capillary: 116 mg/dL — ABNORMAL HIGH (ref 70–99)
Glucose-Capillary: 128 mg/dL — ABNORMAL HIGH (ref 70–99)
Glucose-Capillary: 91 mg/dL (ref 70–99)
Glucose-Capillary: 96 mg/dL (ref 70–99)
Glucose-Capillary: 99 mg/dL (ref 70–99)

## 2012-09-07 LAB — CBC
HCT: 25.4 % — ABNORMAL LOW (ref 36.0–46.0)
Hemoglobin: 8 g/dL — ABNORMAL LOW (ref 12.0–15.0)
RBC: 3.1 MIL/uL — ABNORMAL LOW (ref 3.87–5.11)

## 2012-09-07 NOTE — Progress Notes (Signed)
PT Cancellation Note  Patient Details Name: [name] MRN: [id number] DOB: [birth year]   Cancelled Treatment:    Reason Eval/Treat Not Completed: Fatigue limiting ability to participate. Patient had just returned back to bed after transfer to [facility]. Declined any OOB or lower extremity exercises. Long discussion with patient about benefits of mobility and exercise to prevent further decline in function and respiratory function. She seems agreeable but difficult to tell. Did say she felt comfortable with her HHPT/OT because they took their time with her and didn't push. Seems very resistant to any SNF stay. Will continue to f/u and attempt PT evaluation.    [facility] HELEN 09/07/2012, 2:14 PM Pager: [phone]

## 2012-09-07 NOTE — Evaluation (Signed)
Occupational Therapy Evaluation Patient Details Name: [name] MRN: [id number] DOB: 1940-03-11 Today's Date: 09/07/2012 Time: 4098-1191 OT Time Calculation (min): 21 min  OT Assessment / Plan / Recommendation History of present illness Pt admitted with worsening SOB. Pt with trach/ Passy-Muir valve    Clinical Impression   Pt demos decline in function with ADLs and ADL mobility with significantly decreased strength and endurance. Pt would benefit from acute OT services to address these impairments to increase level of function and safety    OT Assessment  Patient needs continued OT Services    Follow Up Recommendations   HH OT, 24 hour sup/assist. May need SNF depending on acute acre progress   Barriers to Discharge Decreased caregiver support Pt plans to return home, however, pt not very motivated towards functional activity and mobility and demos decreased strength and endurance for fuctional tasks. HH therapy may not be adequate for her recovery  Equipment Recommendations  None recommended by OT;Other (comment) (TBD)    Recommendations for Other Services    Frequency  Min 2X/week    Precautions / Restrictions Precautions Precautions: Fall;Other (comment) (trach collar) Restrictions Weight Bearing Restrictions: No        ADL  Grooming: Performed;Supervision/safety;Set up;Wash/dry hands;Wash/dry face Where Assessed - Grooming: Unsupported sitting Upper Body Bathing: Simulated;Minimal assistance;Set up Where Assessed - Upper Body Bathing: Unsupported sitting Lower Body Bathing: Simulated;Maximal assistance Upper Body Dressing: Performed;Minimal assistance Where Assessed - Upper Body Dressing: Unsupported sitting Lower Body Dressing: +1 Total assistance Toilet Transfer: Other (comment) (pt declined BSC transfer) Transfers/Ambulation Related to ADLs: pt declined OOB activity/transfers ADL Comments: Pt states that she gets out of breath quickly and that she has no  ebergy. Pt plans to return home    OT Diagnosis: Generalized weakness  OT Problem List: Decreased strength;Decreased activity tolerance;Impaired balance (sitting and/or standing) OT Treatment Interventions: Self-care/ADL training;Therapeutic exercise;Neuromuscular education;Therapeutic activities;DME and/or AE instruction   OT Goals(Current goals can be found in the care plan section) ADL Goals Pt Will Perform Upper Body Bathing: with set-up;with supervision;sitting Pt Will Perform Upper Body Dressing: with set-up;with supervision Pt Will Transfer to Toilet: with min guard assist;with min assist;bedside commode Pt Will Perform Toileting - Clothing Manipulation and hygiene: with mod assist Additional ADL Goal #1: Pt will tolerate sitting EOB x 10 - 15 minutes to complete ADL and UE ther ex  Visit Information  Last OT Received On: 09/07/12 Assistance Needed: +1 History of Present Illness: Pt admitted with worsening SOB. Pt with trach/ Passy-Muir valve        Prior Functioning     Home Living Family/patient expects to be discharged to:: Private residence Living Arrangements: Children Available Help at Discharge: Family;Available PRN/intermittently Type of Home: House Home Access: Level entry Home Layout: One level Home Equipment: Walker - 2 wheels;Wheelchair - manual;Grab bars - toilet;Grab bars - tub/shower;Shower seat Prior Function Level of Independence: Needs assistance Gait / Transfers Assistance Needed: pt primariliy uses w/c. short distance amb with RW with home PT ADL's / Homemaking Assistance Needed: assist with cooking and bathing. pt dresses self indep. Communication Communication: No difficulties;Passy-Muir valve;Tracheostomy Dominant Hand: Right         Vision/Perception Vision - History Baseline Vision: Wears glasses only for reading Patient Visual Report: No change from baseline Perception Perception: Within Functional Limits   Cognition   Cognition Arousal/Alertness: Awake/alert Behavior During Therapy: WFL for tasks assessed/performed Overall Cognitive Status: Within Functional Limits for tasks assessed    Extremity/Trunk Assessment Upper Extremity Assessment Upper  Extremity Assessment: Overall WFL for tasks assessed;Generalized weakness     Mobility Bed Mobility Supine to Sit: 5: Supervision;With rails;HOB elevated Transfers Transfers: Not assessed Details for Transfer Assistance: pt declined transfers to chair and [facility]     Exercise     Balance Balance Balance Assessed: Yes Dynamic Sitting Balance Dynamic Sitting - Balance Support: No upper extremity supported;Feet unsupported;During functional activity Dynamic Sitting - Level of Assistance: 5: Stand by assistance   End of Session OT - End of Session Activity Tolerance: Patient limited by fatigue Patient left: in bed  GO     Galen Manila 09/07/2012, 12:12 PM

## 2012-09-07 NOTE — Progress Notes (Signed)
Patient ID: [name]  female  ZOX:[id number]    DOB: 03-07-1940    DOA: 08/20/2012  PCP: Gwynneth Aliment, MD  Assessment/Plan:  Acute on chronic respiratory failure secondary to COPD, OSA/OHS, chronic Lt hemidiaphragm elevation.  S/p Trach revision on 09/01/12 by Dr Pollyann Kennedy (ENT).  Tracheostomy dependent  -continue ATC as tolerated, BD's and pulmicort  -trach care per ENT, I have requested Dr. Pollyann Kennedy to reevaluate patient today for disposition tomorrow  -This is a permanent trach tube   Hx of HTN, hyperlipidemia  -Cont prior regimen   Stage IV CKD.  - stable   Dysphagia.  -Diet per SLP recs, tolerating her diet   Anemia of chronic disease.  -f/u CBC - transfuse if <7 or unstable  -continue FeSO4   DM type II.  -Cont SSI   Hx of Depression, CVA.  -continue zoloft, buspar   DVT Prophylaxis:  Code Status:  Disposition: Unclear, patient had refused PT evaluation yesterday. OT eval today recommended 24 7 supervision and assistance at home. May need SNF, discussed with social worker   Subjective: Patient eating breakfast at the time of my encounter, O2 sats drops in 80s during eating otherwise stays in 90s. Patient reports that she's unsure if she will be able to do trach care at home  Objective: Weight change:   Intake/Output Summary (Last 24 hours) at 09/07/12 1220 Last data filed at 09/07/12 1033  Gross per 24 hour  Intake    240 ml  Output    700 ml  Net   -460 ml   Blood pressure 137/78, pulse 61, temperature 97.9 F (36.6 C), temperature source Oral, resp. rate 18, height 5\' 4"  (1.626 m), weight 77 kg (169 lb 12.1 oz), SpO2 98.00%.  Physical Exam: General: Alert and awake,  CVS: S1-S2 clear, no murmur rubs or gallops Chest: Coarse breath sounds no wheezing  Abdomen: soft nontender, nondistended, normal bowel sounds  Extremities: no cyanosis, clubbing or edema noted bilaterally Neuro: moves all 4 extremeties  Lab Results: Basic Metabolic  Panel:  Recent Labs Lab 09/06/12 0440 09/07/12 0600  NA 141 139  K 4.4 4.7  CL 108 108  CO2 27 24  GLUCOSE 112* 101*  BUN 45* 42*  CREATININE 2.34* 2.39*  CALCIUM 8.6 8.9   Liver Function Tests: No results found for this basename: AST, ALT, ALKPHOS, BILITOT, PROT, ALBUMIN,  in the last 168 hours No results found for this basename: LIPASE, AMYLASE,  in the last 168 hours No results found for this basename: AMMONIA,  in the last 168 hours CBC:  Recent Labs Lab 09/06/12 0440 09/07/12 0600  WBC 10.3 9.1  HGB 7.7* 8.0*  HCT 24.8* 25.4*  MCV 82.4 81.9  PLT 180 186   Cardiac Enzymes: No results found for this basename: CKTOTAL, CKMB, CKMBINDEX, TROPONINI,  in the last 168 hours BNP: No components found with this basename: POCBNP,  CBG:  Recent Labs Lab 09/06/12 1704 09/06/12 2136 09/07/12 0041 09/07/12 0418 09/07/12 0817  GLUCAP 116* 96 116* 99 91     Micro Results: Recent Results (from the past 240 hour(s))  MRSA PCR SCREENING     Status: None   Collection Time    09/01/12  5:35 PM      Result Value Range Status   MRSA by PCR NEGATIVE  NEGATIVE Final   Comment:            The GeneXpert MRSA Assay (FDA     approved for NASAL specimens  only), is one component of a     comprehensive MRSA colonization     surveillance program. It is not     intended to diagnose MRSA     infection nor to guide or     monitor treatment for     MRSA infections.    Studies/Results: Dg Chest 2 View  08/20/2012   *RADIOLOGY REPORT*  Clinical Data: Shortness of breath.  Hypertension.  Cardiomyopathy.  CHEST - 2 VIEW  Comparison: 07/29/2012  Findings: Tracheostomy appropriately positioned.  Moderate cardiomegaly with atherosclerosis in the transverse aorta.  No definite pleural fluid. No pneumothorax.  No congestive failure.  Mild right base volume loss and subsegmental atelectasis.  Slight improvement in left base aeration. Probable subsegmental atelectasis remaining.  Moderate  left hemidiaphragm elevation.  IMPRESSION: Cardiomegaly and moderate left hemidiaphragm elevation.  Bibasilar atelectasis without convincing evidence of acute superimposed process.   Original Report Authenticated By: Jeronimo Greaves, M.D.   Dg Chest Port 1 View  09/03/2012   *RADIOLOGY REPORT*  Clinical Data: Respiratory failure and tracheostomy.  PORTABLE CHEST - 1 VIEW  Comparison: 08/24/2012  Findings: Tracheostomy is grossly stable.  There remains dense consolidation of the left lower lobe with potentially a component of left pleural fluid.  Atelectasis present at the right lung base. No pulmonary edema is seen.  The heart size is stable.  IMPRESSION: Dense left lower lobe consolidation with potential left pleural fluid.   Original Report Authenticated By: Irish Lack, M.D.   Dg Chest Port 1 View  08/24/2012   *RADIOLOGY REPORT*  Clinical Data: Shortness of breath.  PORTABLE CHEST - 1 VIEW  Comparison: Chest x-ray 08/20/2012.  Findings: Mild elevation of the left hemidiaphragm.  Poor visualization of the left base may be secondary to underpenetration of the film, however, there is suspected atelectasis and/or consolidation left lower lobe, likely with superimposed small left pleural effusion.  Pulmonary venous congestion, without frank pulmonary edema.  Mild cardiomegaly is unchanged. The patient is rotated to the left on today's exam, resulting in distortion of the mediastinal contours and reduced diagnostic sensitivity and specificity for mediastinal pathology.  Atherosclerosis in the thoracic aorta.  Tracheostomy tube is in position with tip approximately 6.2 cm above the carina.  IMPRESSION: 1.  Worsening aeration in the left base favored to reflecting increasing atelectasis and/or consolidation, now with superimposed small left pleural effusion.  Chronic elevation of the left hemidiaphragm is unchanged. 2.  Mild cardiomegaly with pulmonary venous congestion, but no frank pulmonary edema. 3.   Atherosclerosis.   Original Report Authenticated By: Trudie Reed, M.D.    Medications: Scheduled Meds: . ipratropium  0.5 mg Nebulization Q6H   And  . albuterol  2.5 mg Nebulization Q6H  . amLODipine  10 mg Oral Daily  . antiseptic oral rinse  15 mL Mouth Rinse Daily  . aspirin  81 mg Oral Daily  . atorvastatin  40 mg Oral Daily  . budesonide  0.25 mg Nebulization Q6H  . busPIRone  5 mg Oral BID  . carvedilol  12.5 mg Oral BID WC  . cloNIDine  0.1 mg Oral BID  . clopidogrel  75 mg Oral Q breakfast  . enoxaparin (LOVENOX) injection  30 mg Subcutaneous Q24H  . ferrous sulfate  325 mg Oral BID  . hydrALAZINE  100 mg Oral TID  . insulin aspart  0-9 Units Subcutaneous Q4H  . isosorbide mononitrate  30 mg Oral Daily  . metoCLOPramide  5 mg Oral QID  . pantoprazole  40 mg Oral Daily  . sertraline  100 mg Oral QHS  . sodium chloride  3 mL Intravenous Q12H  . vitamin C  500 mg Oral Daily      LOS: 18 days   Betzabeth Derringer M.D. [facility] 09/07/2012, 12:20 PM Pager: [phone]  If 7PM-7AM, please contact night-coverage [email or web address] Password [email or web address]

## 2012-09-07 NOTE — Progress Notes (Signed)
SLP Cancellation Note  Patient Details Name: [name] MRN: [id number] DOB: [birth year]   Cancelled treatment:       Reason Eval/Treat Not Completed:  (refused). Pt refused to PO and PMSV placement. Wanted to sleep.    Frona Yost, Riley Nearing 09/07/2012, 4:06 PM

## 2012-09-08 ENCOUNTER — Inpatient Hospital Stay (HOSPITAL_COMMUNITY): Payer: Medicare Other

## 2012-09-08 LAB — GLUCOSE, CAPILLARY
Glucose-Capillary: 112 mg/dL — ABNORMAL HIGH (ref 70–99)
Glucose-Capillary: 119 mg/dL — ABNORMAL HIGH (ref 70–99)
Glucose-Capillary: 84 mg/dL (ref 70–99)

## 2012-09-08 LAB — PRO B NATRIURETIC PEPTIDE: Pro B Natriuretic peptide (BNP): 8344 pg/mL — ABNORMAL HIGH (ref 0–125)

## 2012-09-08 MED ORDER — FUROSEMIDE 40 MG PO TABS
40.0000 mg | ORAL_TABLET | Freq: Every day | ORAL | Status: DC
  Administered 2012-09-09 – 2012-09-11 (×3): 40 mg via ORAL
  Filled 2012-09-08 (×5): qty 1

## 2012-09-08 MED ORDER — FUROSEMIDE 10 MG/ML IJ SOLN
40.0000 mg | Freq: Once | INTRAMUSCULAR | Status: AC
  Administered 2012-09-08: 40 mg via INTRAVENOUS
  Filled 2012-09-08: qty 4

## 2012-09-08 NOTE — Progress Notes (Signed)
Trach changed to #6 cuffless. She is still quite dyspneic so I will keep the P/M valve off for now. Call if any other concerns.

## 2012-09-08 NOTE — Progress Notes (Signed)
Patient ID: [name]  female  ZOX:[id number]    DOB: [birth year]    DOA: 08/20/2012  PCP: Gwynneth Aliment, MD  Assessment/Plan:  Acute on chronic respiratory failure secondary to COPD, OSA/OHS, chronic Lt hemidiaphragm elevation.  S/p Trach revision on 09/01/12 by Dr Pollyann Kennedy (ENT).  Tracheostomy dependent  - continue ATC as tolerated, BD's and pulmicort  - appreciate Dr. Pollyann Kennedy for reevaluating patient, Janina Mayo changed to #6 cuffless, patient able to speak more clearly.  Dyspnea - Obtain BNP, 2 view chest x-ray, one dose of IV Lasix, patient was on torsemide 20 mg daily prior to admission. Her diuretics had been held due to renal insufficiency.   Hx of HTN, hyperlipidemia  -Cont prior regimen   Stage IV CKD.  - stable   Dysphagia.  -Diet per SLP recs, tolerating her diet   Anemia of chronic disease.  -f/u CBC - transfuse if <7 or unstable  -continue FeSO4   DM type II.  -Cont SSI   Hx of Depression, CVA.  -continue zoloft, buspar   DVT Prophylaxis:  Code Status:  Disposition: Patient refuses skilled nursing facility, will arrange home health, hopefully tomorrow   Subjective: Feels short of breath today  Objective: Weight change:   Intake/Output Summary (Last 24 hours) at 09/08/12 1401 Last data filed at 09/08/12 1341  Gross per 24 hour  Intake    840 ml  Output    400 ml  Net    440 ml   Blood pressure 132/54, pulse 62, temperature 98.3 F (36.8 C), temperature source Oral, resp. rate 18, height 5\' 4"  (1.626 m), weight 77 kg (169 lb 12.1 oz), SpO2 100.00%.  Physical Exam: General: Alert and awake, oriented x3, NAD Neck: Trach CVS: S1-S2 clear, no murmur rubs or gallops Chest: Coarse breath sounds bilaterally  Abdomen: soft nontender, nondistended, normal bowel sounds  Extremities: no cyanosis, clubbing or edema noted bilaterally   Lab Results: Basic Metabolic Panel:  Recent Labs Lab 09/06/12 0440 09/07/12 0600  NA 141 139  K 4.4 4.7  CL 108  108  CO2 27 24  GLUCOSE 112* 101*  BUN 45* 42*  CREATININE 2.34* 2.39*  CALCIUM 8.6 8.9   Liver Function Tests: No results found for this basename: AST, ALT, ALKPHOS, BILITOT, PROT, ALBUMIN,  in the last 168 hours No results found for this basename: LIPASE, AMYLASE,  in the last 168 hours No results found for this basename: AMMONIA,  in the last 168 hours CBC:  Recent Labs Lab 09/06/12 0440 09/07/12 0600  WBC 10.3 9.1  HGB 7.7* 8.0*  HCT 24.8* 25.4*  MCV 82.4 81.9  PLT 180 186   Cardiac Enzymes: No results found for this basename: CKTOTAL, CKMB, CKMBINDEX, TROPONINI,  in the last 168 hours BNP: No components found with this basename: POCBNP,  CBG:  Recent Labs Lab 09/07/12 1959 09/08/12 0023 09/08/12 0405 09/08/12 0739 09/08/12 1206  GLUCAP 128* 112* 87 90 107*     Micro Results: Recent Results (from the past 240 hour(s))  MRSA PCR SCREENING     Status: None   Collection Time    09/01/12  5:35 PM      Result Value Range Status   MRSA by PCR NEGATIVE  NEGATIVE Final   Comment:            The GeneXpert MRSA Assay (FDA     approved for NASAL specimens     only), is one component of a     comprehensive MRSA  colonization     surveillance program. It is not     intended to diagnose MRSA     infection nor to guide or     monitor treatment for     MRSA infections.    Studies/Results: Dg Chest 2 View  08/20/2012   *RADIOLOGY REPORT*  Clinical Data: Shortness of breath.  Hypertension.  Cardiomyopathy.  CHEST - 2 VIEW  Comparison: 07/29/2012  Findings: Tracheostomy appropriately positioned.  Moderate cardiomegaly with atherosclerosis in the transverse aorta.  No definite pleural fluid. No pneumothorax.  No congestive failure.  Mild right base volume loss and subsegmental atelectasis.  Slight improvement in left base aeration. Probable subsegmental atelectasis remaining.  Moderate left hemidiaphragm elevation.  IMPRESSION: Cardiomegaly and moderate left hemidiaphragm  elevation.  Bibasilar atelectasis without convincing evidence of acute superimposed process.   Original Report Authenticated By: Jeronimo Greaves, M.D.   Dg Chest Port 1 View  09/03/2012   *RADIOLOGY REPORT*  Clinical Data: Respiratory failure and tracheostomy.  PORTABLE CHEST - 1 VIEW  Comparison: 08/24/2012  Findings: Tracheostomy is grossly stable.  There remains dense consolidation of the left lower lobe with potentially a component of left pleural fluid.  Atelectasis present at the right lung base. No pulmonary edema is seen.  The heart size is stable.  IMPRESSION: Dense left lower lobe consolidation with potential left pleural fluid.   Original Report Authenticated By: Irish Lack, M.D.   Dg Chest Port 1 View  08/24/2012   *RADIOLOGY REPORT*  Clinical Data: Shortness of breath.  PORTABLE CHEST - 1 VIEW  Comparison: Chest x-ray 08/20/2012.  Findings: Mild elevation of the left hemidiaphragm.  Poor visualization of the left base may be secondary to underpenetration of the film, however, there is suspected atelectasis and/or consolidation left lower lobe, likely with superimposed small left pleural effusion.  Pulmonary venous congestion, without frank pulmonary edema.  Mild cardiomegaly is unchanged. The patient is rotated to the left on today's exam, resulting in distortion of the mediastinal contours and reduced diagnostic sensitivity and specificity for mediastinal pathology.  Atherosclerosis in the thoracic aorta.  Tracheostomy tube is in position with tip approximately 6.2 cm above the carina.  IMPRESSION: 1.  Worsening aeration in the left base favored to reflecting increasing atelectasis and/or consolidation, now with superimposed small left pleural effusion.  Chronic elevation of the left hemidiaphragm is unchanged. 2.  Mild cardiomegaly with pulmonary venous congestion, but no frank pulmonary edema. 3.  Atherosclerosis.   Original Report Authenticated By: Trudie Reed, M.D.     Medications: Scheduled Meds: . ipratropium  0.5 mg Nebulization Q6H   And  . albuterol  2.5 mg Nebulization Q6H  . amLODipine  10 mg Oral Daily  . antiseptic oral rinse  15 mL Mouth Rinse Daily  . aspirin  81 mg Oral Daily  . atorvastatin  40 mg Oral Daily  . budesonide  0.25 mg Nebulization Q6H  . busPIRone  5 mg Oral BID  . carvedilol  12.5 mg Oral BID WC  . cloNIDine  0.1 mg Oral BID  . clopidogrel  75 mg Oral Q breakfast  . enoxaparin (LOVENOX) injection  30 mg Subcutaneous Q24H  . ferrous sulfate  325 mg Oral BID  . furosemide  40 mg Intravenous Once  . [START ON 09/09/2012] furosemide  40 mg Oral Daily  . hydrALAZINE  100 mg Oral TID  . insulin aspart  0-9 Units Subcutaneous Q4H  . isosorbide mononitrate  30 mg Oral Daily  . metoCLOPramide  5 mg Oral QID  . pantoprazole  40 mg Oral Daily  . sertraline  100 mg Oral QHS  . sodium chloride  3 mL Intravenous Q12H  . vitamin C  500 mg Oral Daily      LOS: 19 days   Crystal Ellwood M.D. [facility] 09/08/2012, 2:01 PM Pager: [phone]  If 7PM-7AM, please contact night-coverage [email or web address] Password [email or web address]

## 2012-09-08 NOTE — Progress Notes (Signed)
PT Cancellation Note  Patient Details Name: [name] MRN: [id number] DOB: Dec 27, 1940   Cancelled Treatment:    Reason Eval/Treat Not Completed: Fatigue/lethargy limiting ability to participate.  Patient refused to attempt PT, stating she doesn't feel up to PT today.  Provided max encouragement for patient to participate.  Will return at later time.   Vena Austria 09/08/2012, 4:16 PM Durenda Hurt. Renaldo Fiddler, [facility] Acute Rehab Services Pager [phone]

## 2012-09-09 LAB — BASIC METABOLIC PANEL
CO2: 23 mEq/L (ref 19–32)
Chloride: 105 mEq/L (ref 96–112)
Creatinine, Ser: 2.49 mg/dL — ABNORMAL HIGH (ref 0.50–1.10)
GFR calc Af Amer: 21 mL/min — ABNORMAL LOW (ref >90)
Potassium: 4.7 mEq/L (ref 3.5–5.1)
Sodium: 137 mEq/L (ref 135–145)

## 2012-09-09 LAB — GLUCOSE, CAPILLARY
Glucose-Capillary: 98 mg/dL (ref 70–99)
Glucose-Capillary: 79 mg/dL (ref 70–99)
Glucose-Capillary: 115 mg/dL — ABNORMAL HIGH (ref 70–99)
Glucose-Capillary: 159 mg/dL — ABNORMAL HIGH (ref 70–99)

## 2012-09-09 NOTE — Progress Notes (Signed)
Patient ID: [name]  female  AVW:[id number]    DOB: Mar 24, 1940    DOA: 08/20/2012  PCP: Gwynneth Aliment, MD  Assessment/Plan:  Acute on chronic respiratory failure secondary to COPD, OSA/OHS, chronic Lt hemidiaphragm elevation.  S/p Trach revision on 09/01/12 by Dr Pollyann Kennedy (ENT).  Tracheostomy dependent  - continue ATC as tolerated, BD's and pulmicort  - appreciate Dr. Pollyann Kennedy for reevaluating patient, Janina Mayo changed to #6 cuffless, patient able to speak more clearly.  Dyspnea - Elevated BNP at 8344 - Given IV Lasix x1 yesterday, continue oral Lasix. Patient does not like Lasix due to diuresis. - Discussed in detail with Dr. Pollyann Kennedy this morning as patient kept complaining that "something is wrong with her trach and she feels short of breath". Dr. Pollyann Kennedy recommended to keep the PM valve off until dyspnea improves and continue diuresis. Explained to the patient that she needs to be on Lasix for the above.  - Monitor creatinine closely  Hx of HTN, hyperlipidemia  -Cont prior regimen   Stage IV CKD.  - stable   Dysphagia.  -Diet per SLP recs, tolerating her diet   Anemia of chronic disease.  -f/u CBC - transfuse if <7 or unstable  -continue FeSO4   DM type II.  -Cont SSI   Hx of Depression, CVA.  -continue zoloft, buspar   DVT Prophylaxis:  Code Status:  Disposition: Patient refuses skilled nursing facility, will arrange home health at the time of  dc  Subjective: Still feels short of breath, does not like Lasix as she has to urinate frequently  Objective: Weight change:   Intake/Output Summary (Last 24 hours) at 09/09/12 1238 Last data filed at 09/09/12 0900  Gross per 24 hour  Intake    720 ml  Output   1000 ml  Net   -280 ml   Blood pressure 161/72, pulse 62, temperature 97.9 F (36.6 C), temperature source Oral, resp. rate 18, height 5\' 4"  (1.626 m), weight 77 kg (169 lb 12.1 oz), SpO2 100.00%.  Physical Exam: General: Alert and awake, oriented x3,  NAD Neck: Trach dependent CVS: S1-S2 clear, no murmur rubs or gallops Chest: Decreased breath sound at the bases Abdomen: soft nontender, nondistended, normal bowel sounds  Extremities: no cyanosis, clubbing or edema noted bilaterally   Lab Results: Basic Metabolic Panel:  Recent Labs Lab 09/07/12 0600 09/09/12 0500  NA 139 137  K 4.7 4.7  CL 108 105  CO2 24 23  GLUCOSE 101* 91  BUN 42* 42*  CREATININE 2.39* 2.49*  CALCIUM 8.9 8.9   Liver Function Tests: No results found for this basename: AST, ALT, ALKPHOS, BILITOT, PROT, ALBUMIN,  in the last 168 hours No results found for this basename: LIPASE, AMYLASE,  in the last 168 hours No results found for this basename: AMMONIA,  in the last 168 hours CBC:  Recent Labs Lab 09/06/12 0440 09/07/12 0600  WBC 10.3 9.1  HGB 7.7* 8.0*  HCT 24.8* 25.4*  MCV 82.4 81.9  PLT 180 186   Cardiac Enzymes: No results found for this basename: CKTOTAL, CKMB, CKMBINDEX, TROPONINI,  in the last 168 hours BNP: No components found with this basename: POCBNP,  CBG:  Recent Labs Lab 09/08/12 1639 09/08/12 2305 09/09/12 0419 09/09/12 0744 09/09/12 1204  GLUCAP 119* 84 98 79 115*     Micro Results: Recent Results (from the past 240 hour(s))  MRSA PCR SCREENING     Status: None   Collection Time    09/01/12  5:35 PM      Result Value Range Status   MRSA by PCR NEGATIVE  NEGATIVE Final   Comment:            The GeneXpert MRSA Assay (FDA     approved for NASAL specimens     only), is one component of a     comprehensive MRSA colonization     surveillance program. It is not     intended to diagnose MRSA     infection nor to guide or     monitor treatment for     MRSA infections.    Studies/Results: Dg Chest 2 View  08/20/2012   *RADIOLOGY REPORT*  Clinical Data: Shortness of breath.  Hypertension.  Cardiomyopathy.  CHEST - 2 VIEW  Comparison: 07/29/2012  Findings: Tracheostomy appropriately positioned.  Moderate  cardiomegaly with atherosclerosis in the transverse aorta.  No definite pleural fluid. No pneumothorax.  No congestive failure.  Mild right base volume loss and subsegmental atelectasis.  Slight improvement in left base aeration. Probable subsegmental atelectasis remaining.  Moderate left hemidiaphragm elevation.  IMPRESSION: Cardiomegaly and moderate left hemidiaphragm elevation.  Bibasilar atelectasis without convincing evidence of acute superimposed process.   Original Report Authenticated By: Jeronimo Greaves, M.D.   Dg Chest Port 1 View  09/03/2012   *RADIOLOGY REPORT*  Clinical Data: Respiratory failure and tracheostomy.  PORTABLE CHEST - 1 VIEW  Comparison: 08/24/2012  Findings: Tracheostomy is grossly stable.  There remains dense consolidation of the left lower lobe with potentially a component of left pleural fluid.  Atelectasis present at the right lung base. No pulmonary edema is seen.  The heart size is stable.  IMPRESSION: Dense left lower lobe consolidation with potential left pleural fluid.   Original Report Authenticated By: Irish Lack, M.D.   Dg Chest Port 1 View  08/24/2012   *RADIOLOGY REPORT*  Clinical Data: Shortness of breath.  PORTABLE CHEST - 1 VIEW  Comparison: Chest x-ray 08/20/2012.  Findings: Mild elevation of the left hemidiaphragm.  Poor visualization of the left base may be secondary to underpenetration of the film, however, there is suspected atelectasis and/or consolidation left lower lobe, likely with superimposed small left pleural effusion.  Pulmonary venous congestion, without frank pulmonary edema.  Mild cardiomegaly is unchanged. The patient is rotated to the left on today's exam, resulting in distortion of the mediastinal contours and reduced diagnostic sensitivity and specificity for mediastinal pathology.  Atherosclerosis in the thoracic aorta.  Tracheostomy tube is in position with tip approximately 6.2 cm above the carina.  IMPRESSION: 1.  Worsening aeration in the  left base favored to reflecting increasing atelectasis and/or consolidation, now with superimposed small left pleural effusion.  Chronic elevation of the left hemidiaphragm is unchanged. 2.  Mild cardiomegaly with pulmonary venous congestion, but no frank pulmonary edema. 3.  Atherosclerosis.   Original Report Authenticated By: Trudie Reed, M.D.    Medications: Scheduled Meds: . ipratropium  0.5 mg Nebulization Q6H   And  . albuterol  2.5 mg Nebulization Q6H  . amLODipine  10 mg Oral Daily  . antiseptic oral rinse  15 mL Mouth Rinse Daily  . aspirin  81 mg Oral Daily  . atorvastatin  40 mg Oral Daily  . budesonide  0.25 mg Nebulization Q6H  . busPIRone  5 mg Oral BID  . carvedilol  12.5 mg Oral BID WC  . cloNIDine  0.1 mg Oral BID  . clopidogrel  75 mg Oral Q breakfast  . enoxaparin (LOVENOX) injection  30 mg Subcutaneous Q24H  . ferrous sulfate  325 mg Oral BID  . furosemide  40 mg Oral Daily  . hydrALAZINE  100 mg Oral TID  . insulin aspart  0-9 Units Subcutaneous Q4H  . isosorbide mononitrate  30 mg Oral Daily  . metoCLOPramide  5 mg Oral QID  . pantoprazole  40 mg Oral Daily  . sertraline  100 mg Oral QHS  . sodium chloride  3 mL Intravenous Q12H  . vitamin C  500 mg Oral Daily      LOS: 20 days   RAI,RIPUDEEP M.D. [facility] 09/09/2012, 12:38 PM Pager: [phone]  If 7PM-7AM, please contact night-coverage [email or web address] Password [email or web address]

## 2012-09-09 NOTE — Progress Notes (Signed)
PT Cancellation Note  Patient Details Name: [name] MRN: [id number] DOB: [birth year]   Cancelled Treatment:    Reason Eval/Treat Not Completed: Fatigue/lethargy limiting ability to participate.  Patient declined PT today, reporting she doesn't feel like working with PT.  Max verbal encouragement provided.  Let patient know that we will be back tomorrow, and she needs to work with [place] then.   Vena Austria 09/09/2012, 4:35 PM Durenda Hurt. Renaldo Fiddler, [facility] Pager [phone]

## 2012-09-09 NOTE — Progress Notes (Signed)
Passy-Muir Speaking Valve - Treatment Patient Details  Name: [name] MRN: [id number] Date of Birth: February 26, 1940  Today's Date: 09/09/2012 Time: 1205-1215 SLP Time Calculation (min): 10 min  Past Medical History:  Past Medical History  Diagnosis Date  . COPD (chronic obstructive pulmonary disease)     on home o2  . Chronic renal insufficiency   . CVA (cerebral infarction)     hx  . Hypertensive heart disease with congestive heart failure   . Chronic cor pulmonale   . Cardiomyopathy of undetermined type 09/07/2011  . Chronic kidney disease (CKD), stage IV (severe)   . Hyperlipdemia   . Obesity (BMI 30-39.9)   . Sleep apnea   . History of gout 09/07/2011  . Heart failure 7/13  . Shortness of breath   . Anemia   . Vascular disease   . Asthma   . Anginal pain   . Hypertension   . Type II or unspecified type diabetes mellitus without mention of complication, not stated as uncontrolled   . Stroke   . GERD (gastroesophageal reflux disease)   . Arthritis   . Bundle branch block   . Thrombocytopenia   . Acute and chronic respiratory failure   . Obesity, unspecified 08/28/2012   Past Surgical History:  Past Surgical History  Procedure Laterality Date  . Bilateral oophorectomy    . Cardiac catheterization      right heart cath  . Laparoscopic gastrostomy  12/21/2011    Procedure: LAPAROSCOPIC GASTROSTOMY;  Surgeon: Axel Filler, MD;  Location: [facility] OR;  Service: General;  Laterality: N/A;  laparoscopic gastrostomy possible open feeding tube  . Abdominal hysterectomy    . Tracheostomy  11/2011  . Cataracts    . Tracheostomy tube placement  02/18/2012    Procedure: TRACHEOSTOMY;  Surgeon: Serena Colonel, MD;  Location: [facility] OR;  Service: ENT;  Laterality: N/A;  . Tracheostomy revision N/A 09/01/2012    Procedure: TRACHEOSTOMY REVISION;  Surgeon: Serena Colonel, MD;  Location: [facility] OR;  Service: ENT;  Laterality: N/A;    Assessment / Plan / Recommendation Clinical Impression  F/u for PMSV in pt with chronic trach:  Pt has had trach downsized to #6 cuffless.  She was wearing valve as this clinician entered room.  Appeared anxious and c/o difficulty breathing - RR 44.  Valve removed and RR decreased to high 20s.  Reiterated with pt the importance of removing valve when she feels SOB.  Pt is able to place/remove valve independently, but anxiety interferes.  Per RN, not being D/Cd today.  SLP will follow.   Recommend frequent, intermittent supervision from staff when valve is in use.  Wear valve with PO intake.    Plan  Continue with current plan of care           SLP Goals SLP Goal #1: Pt. will increase intelligibility utilizing speech strategies wearing PMSV with RR, HR and Sp02 stable with min verbal/visual cues. SLP Goal #1 - Progress: Progressing toward goal   PMSV Trial  PMSV was placed for: intermittently Able to redirect subglottic air through upper airway: Yes Able to Attain Phonation: Yes Voice Quality: Hoarse Able to Expectorate Secretions: Yes Breath Support for Phonation: Moderately decreased Word: 75-100% accurate Phrase: 75-100% accurate Sentence: 75-100% accurate Respirations During Trial: 44 SpO2 During Trial: 100 % Pulse During Trial: 59 Behavior: Anxious   Tracheostomy Tube    #6 cuffless   Vent Dependency  FiO2 (%): 28 %  Blenda Mounts Laurice 09/09/2012, 12:31 PM

## 2012-09-10 DIAGNOSIS — I5043 Acute on chronic combined systolic (congestive) and diastolic (congestive) heart failure: Secondary | ICD-10-CM

## 2012-09-10 DIAGNOSIS — N189 Chronic kidney disease, unspecified: Secondary | ICD-10-CM

## 2012-09-10 DIAGNOSIS — J9500 Unspecified tracheostomy complication: Secondary | ICD-10-CM

## 2012-09-10 DIAGNOSIS — N179 Acute kidney failure, unspecified: Secondary | ICD-10-CM

## 2012-09-10 DIAGNOSIS — I509 Heart failure, unspecified: Secondary | ICD-10-CM

## 2012-09-10 LAB — GLUCOSE, CAPILLARY
Glucose-Capillary: 134 mg/dL — ABNORMAL HIGH (ref 70–99)
Glucose-Capillary: 142 mg/dL — ABNORMAL HIGH (ref 70–99)
Glucose-Capillary: 75 mg/dL (ref 70–99)
Glucose-Capillary: 92 mg/dL (ref 70–99)

## 2012-09-10 LAB — BASIC METABOLIC PANEL
BUN: 45 mg/dL — ABNORMAL HIGH (ref 6–23)
Calcium: 8.8 mg/dL (ref 8.4–10.5)
Creatinine, Ser: 2.74 mg/dL — ABNORMAL HIGH (ref 0.50–1.10)
GFR calc Af Amer: 19 mL/min — ABNORMAL LOW (ref >90)
GFR calc non Af Amer: 16 mL/min — ABNORMAL LOW (ref >90)
Glucose, Bld: 91 mg/dL (ref 70–99)
Potassium: 4.6 mEq/L (ref 3.5–5.1)

## 2012-09-10 NOTE — Progress Notes (Signed)
PT Cancellation Note  Patient Details Name: [name] MRN: [id number] DOB: September 11, 1940   Cancelled Treatment:    Reason Eval/Treat Not Completed: Patient declined, no reason specified. Pt reports up to [facility] with assist and at home is transfers to [facility] only with or without assist per report. Pt states she does not want or need therapy this admission.  This is our third attempt and will d/c PT and defer basic therapy needs to nursing staff.   Narda Amber [facility] 09/10/2012, 11:56 AM

## 2012-09-10 NOTE — Progress Notes (Signed)
Patient ID: [name]  female  ZOX:[id number]    DOB: Nov 19, 1940    DOA: 08/20/2012  PCP: Gwynneth Aliment, MD  Assessment/Plan:  Acute on chronic respiratory failure secondary to COPD, OSA/OHS, chronic Lt hemidiaphragm elevation.  S/p Trach revision on 09/01/12 by Dr Pollyann Kennedy (ENT).  Tracheostomy dependent  - continue ATC as tolerated, BD's and pulmicort  - appreciate Dr. Pollyann Kennedy for reevaluating patient, Janina Mayo changed to #6 cuffless, patient able to speak more clearly.  Dyspnea - Elevated BNP at 8344 - continue oral Lasix. Patient does not like Lasix due to diuresis. Creatinine has started trending up to 2.7. -  Dr. Pollyann Kennedy recommended to keep the PM valve off until dyspnea improves and continue diuresis. Explained to the patient that she needs to be on Lasix for the above.  - Monitor creatinine closely  Hx of HTN, hyperlipidemia  -Cont prior regimen   Stage IV CKD.  - stable   Dysphagia.  -Diet per SLP recs, tolerating her diet   Anemia of chronic disease.  -f/u CBC - transfuse if <7 or unstable  -continue FeSO4   DM type II.  -Cont SSI   Hx of Depression, CVA.  -continue zoloft, buspar   DVT Prophylaxis:  Code Status:  Disposition: Patient refuses skilled nursing facility, will arrange home health at the time of  dc  Subjective: Still feels short of breath, appears somewhat depressed today  Objective: Weight change:   Intake/Output Summary (Last 24 hours) at 09/10/12 1232 Last data filed at 09/10/12 1200  Gross per 24 hour  Intake    843 ml  Output   1950 ml  Net  -1107 ml   Blood pressure 155/76, pulse 64, temperature 98.2 F (36.8 C), temperature source Oral, resp. rate 16, height 5\' 4"  (1.626 m), weight 77 kg (169 lb 12.1 oz), SpO2 98.00%.  Physical Exam: General: Alert and awake, NAD Neck: Trach dependent CVS: S1-S2 clear, no murmur rubs or gallops Chest: Decreased breath sound at the bases Abdomen: soft NT, ND, NBS  Extremities: no c/c/e  bilaterally   Lab Results: Basic Metabolic Panel:  Recent Labs Lab 09/09/12 0500 09/10/12 0630  NA 137 136  K 4.7 4.6  CL 105 105  CO2 23 24  GLUCOSE 91 91  BUN 42* 45*  CREATININE 2.49* 2.74*  CALCIUM 8.9 8.8   Liver Function Tests: No results found for this basename: AST, ALT, ALKPHOS, BILITOT, PROT, ALBUMIN,  in the last 168 hours No results found for this basename: LIPASE, AMYLASE,  in the last 168 hours No results found for this basename: AMMONIA,  in the last 168 hours CBC:  Recent Labs Lab 09/06/12 0440 09/07/12 0600  WBC 10.3 9.1  HGB 7.7* 8.0*  HCT 24.8* 25.4*  MCV 82.4 81.9  PLT 180 186   Cardiac Enzymes: No results found for this basename: CKTOTAL, CKMB, CKMBINDEX, TROPONINI,  in the last 168 hours BNP: No components found with this basename: POCBNP,  CBG:  Recent Labs Lab 09/09/12 1951 09/10/12 0001 09/10/12 0344 09/10/12 0749 09/10/12 1155  GLUCAP 159* 75 97 92 99     Micro Results: Recent Results (from the past 240 hour(s))  MRSA PCR SCREENING     Status: None   Collection Time    09/01/12  5:35 PM      Result Value Range Status   MRSA by PCR NEGATIVE  NEGATIVE Final   Comment:            The GeneXpert MRSA  Assay (FDA     approved for NASAL specimens     only), is one component of a     comprehensive MRSA colonization     surveillance program. It is not     intended to diagnose MRSA     infection nor to guide or     monitor treatment for     MRSA infections.    Studies/Results: Dg Chest 2 View  08/20/2012   *RADIOLOGY REPORT*  Clinical Data: Shortness of breath.  Hypertension.  Cardiomyopathy.  CHEST - 2 VIEW  Comparison: 07/29/2012  Findings: Tracheostomy appropriately positioned.  Moderate cardiomegaly with atherosclerosis in the transverse aorta.  No definite pleural fluid. No pneumothorax.  No congestive failure.  Mild right base volume loss and subsegmental atelectasis.  Slight improvement in left base aeration. Probable  subsegmental atelectasis remaining.  Moderate left hemidiaphragm elevation.  IMPRESSION: Cardiomegaly and moderate left hemidiaphragm elevation.  Bibasilar atelectasis without convincing evidence of acute superimposed process.   Original Report Authenticated By: Jeronimo Greaves, M.D.   Dg Chest Port 1 View  09/03/2012   *RADIOLOGY REPORT*  Clinical Data: Respiratory failure and tracheostomy.  PORTABLE CHEST - 1 VIEW  Comparison: 08/24/2012  Findings: Tracheostomy is grossly stable.  There remains dense consolidation of the left lower lobe with potentially a component of left pleural fluid.  Atelectasis present at the right lung base. No pulmonary edema is seen.  The heart size is stable.  IMPRESSION: Dense left lower lobe consolidation with potential left pleural fluid.   Original Report Authenticated By: Irish Lack, M.D.   Dg Chest Port 1 View  08/24/2012   *RADIOLOGY REPORT*  Clinical Data: Shortness of breath.  PORTABLE CHEST - 1 VIEW  Comparison: Chest x-ray 08/20/2012.  Findings: Mild elevation of the left hemidiaphragm.  Poor visualization of the left base may be secondary to underpenetration of the film, however, there is suspected atelectasis and/or consolidation left lower lobe, likely with superimposed small left pleural effusion.  Pulmonary venous congestion, without frank pulmonary edema.  Mild cardiomegaly is unchanged. The patient is rotated to the left on today's exam, resulting in distortion of the mediastinal contours and reduced diagnostic sensitivity and specificity for mediastinal pathology.  Atherosclerosis in the thoracic aorta.  Tracheostomy tube is in position with tip approximately 6.2 cm above the carina.  IMPRESSION: 1.  Worsening aeration in the left base favored to reflecting increasing atelectasis and/or consolidation, now with superimposed small left pleural effusion.  Chronic elevation of the left hemidiaphragm is unchanged. 2.  Mild cardiomegaly with pulmonary venous congestion,  but no frank pulmonary edema. 3.  Atherosclerosis.   Original Report Authenticated By: Trudie Reed, M.D.    Medications: Scheduled Meds: . ipratropium  0.5 mg Nebulization Q6H   And  . albuterol  2.5 mg Nebulization Q6H  . amLODipine  10 mg Oral Daily  . antiseptic oral rinse  15 mL Mouth Rinse Daily  . aspirin  81 mg Oral Daily  . atorvastatin  40 mg Oral Daily  . budesonide  0.25 mg Nebulization Q6H  . busPIRone  5 mg Oral BID  . carvedilol  12.5 mg Oral BID WC  . cloNIDine  0.1 mg Oral BID  . clopidogrel  75 mg Oral Q breakfast  . enoxaparin (LOVENOX) injection  30 mg Subcutaneous Q24H  . ferrous sulfate  325 mg Oral BID  . furosemide  40 mg Oral Daily  . hydrALAZINE  100 mg Oral TID  . insulin aspart  0-9 Units  Subcutaneous Q4H  . isosorbide mononitrate  30 mg Oral Daily  . metoCLOPramide  5 mg Oral QID  . pantoprazole  40 mg Oral Daily  . sertraline  100 mg Oral QHS  . sodium chloride  3 mL Intravenous Q12H  . vitamin C  500 mg Oral Daily      LOS: 21 days   RAI,RIPUDEEP M.D. [facility] 09/10/2012, 12:32 PM Pager: [phone]  If 7PM-7AM, please contact night-coverage [email or web address] Password [email or web address]

## 2012-09-11 ENCOUNTER — Inpatient Hospital Stay: Payer: Medicare Other | Admitting: Internal Medicine

## 2012-09-11 LAB — BASIC METABOLIC PANEL
BUN: 46 mg/dL — ABNORMAL HIGH (ref 6–23)
CO2: 25 mEq/L (ref 19–32)
Chloride: 105 mEq/L (ref 96–112)
GFR calc Af Amer: 17 mL/min — ABNORMAL LOW (ref >90)
Potassium: 4.6 mEq/L (ref 3.5–5.1)

## 2012-09-11 LAB — GLUCOSE, CAPILLARY
Glucose-Capillary: 94 mg/dL (ref 70–99)
Glucose-Capillary: 86 mg/dL (ref 70–99)

## 2012-09-11 NOTE — Progress Notes (Addendum)
Patient ID: [name]  female  ZOX:[id number]    DOB: 1940/11/08    DOA: 08/20/2012  PCP: Gwynneth Aliment, MD  Addendum:  D/w Dr Jacky Kindle regarding difficulty about safe disposition for Ms. Crafton. Patient does not have 24/7 supervision at home. Her daughter is requesting [facility] everyday, someone for trach care, HHA for bathing etc which is probably not feasible with home health. She herself (daughter) has medical issues, works and is on HD.  Per Dr. Vernie Murders recommendation, I have ordered psych consult for medical decision capacity for patient.    Brecken Walth M.D. [facility] 09/11/2012, 3:19 PM  Pager: [phone]       Assessment/Plan:  Acute on chronic respiratory failure secondary to COPD, OSA/OHS, chronic Lt hemidiaphragm elevation.  S/p Trach revision on 09/01/12 by Dr Pollyann Kennedy (ENT).  Tracheostomy dependent  - continue ATC as tolerated, BD's and pulmicort  - appreciate Dr. Pollyann Kennedy for reevaluating patient, Janina Mayo changed to #6 cuffless, patient able to speak more clearly.  Dyspnea - Elevated BNP at 8344 on 7/18, will recheck tomorrow - continue oral Lasix. Cr is trending up  -  Dr. Pollyann Kennedy recommended to keep the PM valve off until dyspnea improves and continue diuresis. Explained to the patient that she needs to be on Lasix for the above.  - Monitor creatinine closely, I/O's negative balance  Hx of HTN, hyperlipidemia  -Cont prior regimen   Stage IV CKD.  - stable   Dysphagia.  -Diet per SLP recs, tolerating her diet   Anemia of chronic disease.  -f/u CBC - transfuse if <7 or unstable  -continue FeSO4   DM type II.  -Cont SSI   Hx of Depression, CVA.  -continue zoloft, buspar   DVT Prophylaxis:  Code Status:  Disposition: Patient refuses skilled nursing facility, will arrange home health at the time of  dc  I discussed with patient's daughter, Sunday Corn in detail, she refused SNF as well but requested [facility] RN everyday, somebody to take care of  trach, home health aid. She works MWF 9-about 8:45pm. Daughter has her own medical issues and needs HD three times a week. I am concerned about patient's home situation, will d/w with case-manager      Subjective: Still feels short of breath, doesnot want to talk, shakes head "No" upon questioning if she is feeling betetr  Objective: Weight change:   Intake/Output Summary (Last 24 hours) at 09/11/12 1256 Last data filed at 09/11/12 0524  Gross per 24 hour  Intake    480 ml  Output   1100 ml  Net   -620 ml   Blood pressure 151/66, pulse 74, temperature 98.6 F (37 C), temperature source Oral, resp. rate 20, height 5\' 4"  (1.626 m), weight 77 kg (169 lb 12.1 oz), SpO2 99.00%.  Physical Exam: General: Alert and awake, appears depressed Neck: Trach dependent CVS: S1-S2 clear, no murmur rubs or gallops Chest: Decreased breath sound at the bases Abdomen: soft NT, ND, NBS  Extremities: no c/c/e bilaterally   Lab Results: Basic Metabolic Panel:  Recent Labs Lab 09/10/12 0630 09/11/12 0640  NA 136 138  K 4.6 4.6  CL 105 105  CO2 24 25  GLUCOSE 91 90  BUN 45* 46*  CREATININE 2.74* 2.91*  CALCIUM 8.8 8.6   CBC:  Recent Labs Lab 09/06/12 0440 09/07/12 0600  WBC 10.3 9.1  HGB 7.7* 8.0*  HCT 24.8* 25.4*  MCV 82.4 81.9  PLT 180 186   Cardiac Enzymes: No results found  for this basename: CKTOTAL, CKMB, CKMBINDEX, TROPONINI,  in the last 168 hours BNP: No components found with this basename: POCBNP,  CBG:  Recent Labs Lab 09/10/12 1936 09/10/12 2359 09/11/12 0403 09/11/12 0742 09/11/12 1212  GLUCAP 142* 87 94 86 103*     Micro Results: Recent Results (from the past 240 hour(s))  MRSA PCR SCREENING     Status: None   Collection Time    09/01/12  5:35 PM      Result Value Range Status   MRSA by PCR NEGATIVE  NEGATIVE Final   Comment:            The GeneXpert MRSA Assay (FDA     approved for NASAL specimens     only), is one component of a      comprehensive MRSA colonization     surveillance program. It is not     intended to diagnose MRSA     infection nor to guide or     monitor treatment for     MRSA infections.    Studies/Results: Dg Chest 2 View  08/20/2012   *RADIOLOGY REPORT*  Clinical Data: Shortness of breath.  Hypertension.  Cardiomyopathy.  CHEST - 2 VIEW  Comparison: 07/29/2012  Findings: Tracheostomy appropriately positioned.  Moderate cardiomegaly with atherosclerosis in the transverse aorta.  No definite pleural fluid. No pneumothorax.  No congestive failure.  Mild right base volume loss and subsegmental atelectasis.  Slight improvement in left base aeration. Probable subsegmental atelectasis remaining.  Moderate left hemidiaphragm elevation.  IMPRESSION: Cardiomegaly and moderate left hemidiaphragm elevation.  Bibasilar atelectasis without convincing evidence of acute superimposed process.   Original Report Authenticated By: Jeronimo Greaves, M.D.   Dg Chest Port 1 View  09/03/2012   *RADIOLOGY REPORT*  Clinical Data: Respiratory failure and tracheostomy.  PORTABLE CHEST - 1 VIEW  Comparison: 08/24/2012  Findings: Tracheostomy is grossly stable.  There remains dense consolidation of the left lower lobe with potentially a component of left pleural fluid.  Atelectasis present at the right lung base. No pulmonary edema is seen.  The heart size is stable.  IMPRESSION: Dense left lower lobe consolidation with potential left pleural fluid.   Original Report Authenticated By: Irish Lack, M.D.   Dg Chest Port 1 View  08/24/2012   *RADIOLOGY REPORT*  Clinical Data: Shortness of breath.  PORTABLE CHEST - 1 VIEW  Comparison: Chest x-ray 08/20/2012.  Findings: Mild elevation of the left hemidiaphragm.  Poor visualization of the left base may be secondary to underpenetration of the film, however, there is suspected atelectasis and/or consolidation left lower lobe, likely with superimposed small left pleural effusion.  Pulmonary venous  congestion, without frank pulmonary edema.  Mild cardiomegaly is unchanged. The patient is rotated to the left on today's exam, resulting in distortion of the mediastinal contours and reduced diagnostic sensitivity and specificity for mediastinal pathology.  Atherosclerosis in the thoracic aorta.  Tracheostomy tube is in position with tip approximately 6.2 cm above the carina.  IMPRESSION: 1.  Worsening aeration in the left base favored to reflecting increasing atelectasis and/or consolidation, now with superimposed small left pleural effusion.  Chronic elevation of the left hemidiaphragm is unchanged. 2.  Mild cardiomegaly with pulmonary venous congestion, but no frank pulmonary edema. 3.  Atherosclerosis.   Original Report Authenticated By: Trudie Reed, M.D.    Medications: Scheduled Meds: . ipratropium  0.5 mg Nebulization Q6H   And  . albuterol  2.5 mg Nebulization Q6H  . amLODipine  10 mg  Oral Daily  . antiseptic oral rinse  15 mL Mouth Rinse Daily  . aspirin  81 mg Oral Daily  . atorvastatin  40 mg Oral Daily  . budesonide  0.25 mg Nebulization Q6H  . busPIRone  5 mg Oral BID  . carvedilol  12.5 mg Oral BID WC  . cloNIDine  0.1 mg Oral BID  . clopidogrel  75 mg Oral Q breakfast  . enoxaparin (LOVENOX) injection  30 mg Subcutaneous Q24H  . ferrous sulfate  325 mg Oral BID  . furosemide  40 mg Oral Daily  . hydrALAZINE  100 mg Oral TID  . insulin aspart  0-9 Units Subcutaneous Q4H  . isosorbide mononitrate  30 mg Oral Daily  . metoCLOPramide  5 mg Oral QID  . pantoprazole  40 mg Oral Daily  . sertraline  100 mg Oral QHS  . sodium chloride  3 mL Intravenous Q12H  . vitamin C  500 mg Oral Daily      LOS: 22 days   Shabria Egley M.D. [facility] 09/11/2012, 12:56 PM Pager: [phone]  If 7PM-7AM, please contact night-coverage [email or web address] Password [email or web address]

## 2012-09-11 NOTE — Progress Notes (Signed)
PMT will sign off at this time.  Plan is home with home health, family has verbalized "they have what they need at this time".  Information on self referral for home hospice services has been offered to daughter if family sees need in the future.  Please re consult if we can be of assistance in the future.  Lorinda Creed NP  Palliative Medicine Team Team Phone # [phone] Pager [phone]

## 2012-09-12 DIAGNOSIS — F4321 Adjustment disorder with depressed mood: Secondary | ICD-10-CM

## 2012-09-12 LAB — BASIC METABOLIC PANEL
BUN: 53 mg/dL — ABNORMAL HIGH (ref 6–23)
Chloride: 106 mEq/L (ref 96–112)
Glucose, Bld: 92 mg/dL (ref 70–99)
Potassium: 4.9 mEq/L (ref 3.5–5.1)

## 2012-09-12 LAB — GLUCOSE, CAPILLARY
Glucose-Capillary: 85 mg/dL (ref 70–99)
Glucose-Capillary: 90 mg/dL (ref 70–99)
Glucose-Capillary: 167 mg/dL — ABNORMAL HIGH (ref 70–99)
Glucose-Capillary: 93 mg/dL (ref 70–99)

## 2012-09-12 NOTE — Progress Notes (Signed)
Patient ID: [name]  female  AVW:[id number]    DOB: 21-Feb-1941    DOA: 08/20/2012  PCP: Gwynneth Aliment, MD  Interim summary and Hospital course so far:   75 F with chronic trach (since autumn of 2013) initially placed 11/23/11 by Dr Bary Richard for recurrent Intubation, initially for hypercarbic resp failure, then subsequently for vocal cord edema. She was re-admitted 12/03 - 12/08 after trach tube dislodged and could not be replaced. She refused repeat trach tube placement at that time. She was discharged to [facility] without replacement of the trach tube. She presented 12/23 to ED with PNA and while in ED suffered cardiac arrest for which she was again intubated during resuscitation. Dr Pollyann Kennedy performed repeat tracheostomy tube placement 02/14/12 and the tube has remained since that time.  She was seen in ED 6/07 with dislodgement of trach tube and it was replaced and she was discharged back to SNF. She was admitted on 6/ 29 for worsening shortness of breath likely due to COPD, mucus plugging, deconditioning, cor pulmonale and fluid overload leading to acute on chronic resp and heart failure. Patient was placed on IV diuresis. On 08/29/12, the RT noted that the trach tube appeared malpositioned with some blood in the tube's lumen.Pulmonary critical care medicine was consulted. The tube could not be replaced as there was no discernible stoma tract.  In the interim, palliative medicine was consulted as well for GOC, patient and her daughter however requested for continued aggressive care, full code. ENT, Dr. Pollyann Kennedy was consulted on 08/31/12, felt on exam that the trach site is healthy without any obvious mucus or air leak, so patient underwent tracheostomy revision on 09/01/12.  Patient was transferred out of ICU to the hospitalist service on 09/05/12. Dr. Pollyann Kennedy reevaluated the patient on 7/18 as patient had difficulty speaking and still dyspneic. Janina Mayo was changed to #6 cuffless. Per Dr. Lucky Rathke  recommendation, keep the P/M valve off for now due to dyspnea. Speech therapy has been following the patient and recommended frequent intermittent supervision when valve is in use and she needs to wear valve with PO intake.  Patient was on torsemide at home which was held during hospitalization due to acute renal insufficiency at the time of admission. She was placed on diuresis with lasix to help with dyspnea however due to worsening creatinine 3.27 today, lasix is held today.    Disposition is difficult as patient is refusing skilled nursing facility and does not have enough supervision at home please see the note below.     Assessment/Plan:  Acute on chronic respiratory failure secondary to COPD, OSA/OHS, chronic Lt hemidiaphragm elevation.  S/p Trach revision on 09/01/12 by Dr Pollyann Kennedy (ENT).  Tracheostomy dependent  - continue ATC as tolerated, BD's and pulmicort  - appreciate Dr. Pollyann Kennedy for reevaluating patient, Janina Mayo changed to #6 cuffless, patient able to speak more clearly.  Dyspnea - Elevated BNP at 8344 on 7/18, now down to 4030  today but creatinine trended up to 3.2  - HOLD lasix  -  Dr. Pollyann Kennedy recommended to keep the PM valve off until dyspnea improves  - Monitor creatinine closely, I/O's negative balance  Hx of HTN, hyperlipidemia  -Cont prior regimen   Stage IV CKD.  - Monitor creatinine function closely  Dysphagia.  -Diet per SLP recs, tolerating her diet   Anemia of chronic disease.  -f/u CBC - transfuse if <7 or unstable  -continue FeSO4   DM type II.  -Cont SSI   Hx of  Depression, CVA.  -continue zoloft, buspar   DVT Prophylaxis:  Code Status:  Disposition: Patient refuses skilled nursing facility,  I discussed with patient's daughter, Sunday Corn in detail yesterday, she refused SNF as well but requested [facility] RN everyday, somebody to take care of trach, home health aid. She works MWF 9-about 8:45pm. Daughter has her own medical issues and needs HD three  times a week. I am concerned about patient's home situation. D/w Dr Jacky Kindle regarding difficulty about safe disposition for Ms. Shadix. Patient does not have 24/7 supervision at home. Her daughter is requesting [facility] everyday, someone for trach care, HHA for bathing etc which is probably not feasible with home health. She herself (daughter) has medical issues, works and is on HD.  Per Dr. Vernie Murders recommendation, I have placed psych consult for medical decision capacity for patient.     Subjective: Appears SOB today, congested and rhonchorous, called respiratory to suction and breathing treatment    Objective: Weight change:   Intake/Output Summary (Last 24 hours) at 09/12/12 0955 Last data filed at 09/11/12 1418  Gross per 24 hour  Intake    240 ml  Output    400 ml  Net   -160 ml   Blood pressure 140/63, pulse 66, temperature 98 F (36.7 C), temperature source Oral, resp. rate 16, height 5\' 4"  (1.626 m), weight 77 kg (169 lb 12.1 oz), SpO2 100.00%.  Physical Exam: General: Alert and awake, eating breakfast without any difficulty Neck: Trach dependent CVS: S1-S2 clear, no murmur rubs or gallops Chest: Congested and rhonchorous  Abdomen: soft NT, ND, NBS  Extremities: no c/c/e bilaterally   Lab Results: Basic Metabolic Panel:  Recent Labs Lab 09/11/12 0640 09/12/12 0625  NA 138 137  K 4.6 4.9  CL 105 106  CO2 25 24  GLUCOSE 90 92  BUN 46* 53*  CREATININE 2.91* 3.27*  CALCIUM 8.6 8.6   CBC:  Recent Labs Lab 09/06/12 0440 09/07/12 0600  WBC 10.3 9.1  HGB 7.7* 8.0*  HCT 24.8* 25.4*  MCV 82.4 81.9  PLT 180 186   Cardiac Enzymes: No results found for this basename: CKTOTAL, CKMB, CKMBINDEX, TROPONINI,  in the last 168 hours BNP: No components found with this basename: POCBNP,  CBG:  Recent Labs Lab 09/11/12 1212 09/11/12 1556 09/11/12 1949 09/11/12 2334 09/12/12 0431  GLUCAP 103* 103* 115* 138* 85     Micro Results: No results found for this  or any previous visit (from the past 240 hour(s)).  Studies/Results: Dg Chest 2 View  08/20/2012   *RADIOLOGY REPORT*  Clinical Data: Shortness of breath.  Hypertension.  Cardiomyopathy.  CHEST - 2 VIEW  Comparison: 07/29/2012  Findings: Tracheostomy appropriately positioned.  Moderate cardiomegaly with atherosclerosis in the transverse aorta.  No definite pleural fluid. No pneumothorax.  No congestive failure.  Mild right base volume loss and subsegmental atelectasis.  Slight improvement in left base aeration. Probable subsegmental atelectasis remaining.  Moderate left hemidiaphragm elevation.  IMPRESSION: Cardiomegaly and moderate left hemidiaphragm elevation.  Bibasilar atelectasis without convincing evidence of acute superimposed process.   Original Report Authenticated By: Jeronimo Greaves, M.D.   Dg Chest Port 1 View  09/03/2012   *RADIOLOGY REPORT*  Clinical Data: Respiratory failure and tracheostomy.  PORTABLE CHEST - 1 VIEW  Comparison: 08/24/2012  Findings: Tracheostomy is grossly stable.  There remains dense consolidation of the left lower lobe with potentially a component of left pleural fluid.  Atelectasis present at the right lung base. No pulmonary edema  is seen.  The heart size is stable.  IMPRESSION: Dense left lower lobe consolidation with potential left pleural fluid.   Original Report Authenticated By: Irish Lack, M.D.   Dg Chest Port 1 View  08/24/2012   *RADIOLOGY REPORT*  Clinical Data: Shortness of breath.  PORTABLE CHEST - 1 VIEW  Comparison: Chest x-ray 08/20/2012.  Findings: Mild elevation of the left hemidiaphragm.  Poor visualization of the left base may be secondary to underpenetration of the film, however, there is suspected atelectasis and/or consolidation left lower lobe, likely with superimposed small left pleural effusion.  Pulmonary venous congestion, without frank pulmonary edema.  Mild cardiomegaly is unchanged. The patient is rotated to the left on today's exam,  resulting in distortion of the mediastinal contours and reduced diagnostic sensitivity and specificity for mediastinal pathology.  Atherosclerosis in the thoracic aorta.  Tracheostomy tube is in position with tip approximately 6.2 cm above the carina.  IMPRESSION: 1.  Worsening aeration in the left base favored to reflecting increasing atelectasis and/or consolidation, now with superimposed small left pleural effusion.  Chronic elevation of the left hemidiaphragm is unchanged. 2.  Mild cardiomegaly with pulmonary venous congestion, but no frank pulmonary edema. 3.  Atherosclerosis.   Original Report Authenticated By: Trudie Reed, M.D.    Medications: Scheduled Meds: . ipratropium  0.5 mg Nebulization Q6H   And  . albuterol  2.5 mg Nebulization Q6H  . amLODipine  10 mg Oral Daily  . antiseptic oral rinse  15 mL Mouth Rinse Daily  . aspirin  81 mg Oral Daily  . atorvastatin  40 mg Oral Daily  . budesonide  0.25 mg Nebulization Q6H  . busPIRone  5 mg Oral BID  . carvedilol  12.5 mg Oral BID WC  . cloNIDine  0.1 mg Oral BID  . clopidogrel  75 mg Oral Q breakfast  . enoxaparin (LOVENOX) injection  30 mg Subcutaneous Q24H  . ferrous sulfate  325 mg Oral BID  . furosemide  40 mg Oral Daily  . hydrALAZINE  100 mg Oral TID  . insulin aspart  0-9 Units Subcutaneous Q4H  . isosorbide mononitrate  30 mg Oral Daily  . metoCLOPramide  5 mg Oral QID  . pantoprazole  40 mg Oral Daily  . sertraline  100 mg Oral QHS  . sodium chloride  3 mL Intravenous Q12H  . vitamin C  500 mg Oral Daily      LOS: 23 days   Jakylah Bassinger M.D. [facility] 09/12/2012, 9:55 AM Pager: [phone]  If 7PM-7AM, please contact night-coverage [email or web address] Password [email or web address]

## 2012-09-12 NOTE — Progress Notes (Signed)
Occupational Therapy Treatment Patient Details Name: [name] MRN: [id number] DOB: [birth year] Today's Date: 09/12/2012 Time: 4782-9562 OT Time Calculation (min): 12 min  OT Assessment / Plan / Recommendation                              Frequency Min 2X/week   Progress towards OT Goals Progress towards OT goals: Progressing toward goals        Precautions / Restrictions Precautions Precautions: Fall Restrictions Weight Bearing Restrictions: No   Pertinent Vitals/Pain No c/o pain, only c/o sob throughout session and with any functional movements    ADL  Toilet Transfer: Performed;Minimal assistance Toilet Transfer Method: Surveyor, minerals: Materials engineer and Hygiene: Performed;Minimal assistance Where Assessed - Glass blower/designer Manipulation and Hygiene: Standing Transfers/Ambulation Related to ADLs: pivot transfers with min a and cues for hand placement ADL Comments: pt. found standing in front of bsc upon arrival into room, "i need help".  provided toilet paper and pt. able to wipe front peri area in standing with cga, back area with min a.  returned to bed and requested no more activity secondary to sob.        OT Goals(current goals can now be found in the care plan section)    Visit Information  Last OT Received On: 09/12/12                 Cognition  Cognition Arousal/Alertness: Awake/alert Behavior During Therapy: [facility] for tasks assessed/performed Overall Cognitive Status: Within Functional Limits for tasks assessed    Mobility  Transfers Transfers: Sit to Stand;Stand to Sit Sit to Stand: 4: Min guard;With upper extremity assist;From bed Stand to Sit: 4: Min guard;With upper extremity assist;With armrests;To bed Details for Transfer Assistance: cues for hand placement and safe pivot to bed               End of Session OT - End of Session Activity Tolerance: Patient limited  by fatigue Patient left: in bed;with call bell/phone within reach       Robet Leu, COTA/L 09/12/2012, 10:59 AM

## 2012-09-12 NOTE — Consult Note (Signed)
Reason for Consult: Mental capacity Referring Physician: Dr. Burns Spain is an 72 y.o. female.  HPI: Patient was admitted for trach issues.  She has multiple chronic medical issues and needs assistance with ADLs, total trach care.  Occupational therapy is working with her ADLs but the patient refuses to learn trach care.  Dr. Lucianne Muss suggested she go to a place that can help her take care of her needs.  The patients stated, "been in several, not going to another one."  She stated she has a lawyer who has her POA and living will established.  Her daughter is her POA and does not want her to go to a SNF either, she wants home health every day since she works Monday through Friday, long hours.  Ms. Allensworth was appropriate and answered questions appropriately; she has the mental cognition to make her own decisions regarding her desires.  Dr. Lucianne Muss assessed this patient and agrees with the treatment plan below.  Past Medical History  Diagnosis Date  . COPD (chronic obstructive pulmonary disease)     on home o2  . Chronic renal insufficiency   . CVA (cerebral infarction)     hx  . Hypertensive heart disease with congestive heart failure   . Chronic cor pulmonale   . Cardiomyopathy of undetermined type 09/07/2011  . Chronic kidney disease (CKD), stage IV (severe)   . Hyperlipdemia   . Obesity (BMI 30-39.9)   . Sleep apnea   . History of gout 09/07/2011  . Heart failure 7/13  . Shortness of breath   . Anemia   . Vascular disease   . Asthma   . Anginal pain   . Hypertension   . Type II or unspecified type diabetes mellitus without mention of complication, not stated as uncontrolled   . Stroke   . GERD (gastroesophageal reflux disease)   . Arthritis   . Bundle branch block   . Thrombocytopenia   . Acute and chronic respiratory failure   . Obesity, unspecified 08/28/2012    Past Surgical History  Procedure Laterality Date  . Bilateral oophorectomy    . Cardiac catheterization       right heart cath  . Laparoscopic gastrostomy  12/21/2011    Procedure: LAPAROSCOPIC GASTROSTOMY;  Surgeon: Axel Filler, MD;  Location: [facility] OR;  Service: General;  Laterality: N/A;  laparoscopic gastrostomy possible open feeding tube  . Abdominal hysterectomy    . Tracheostomy  11/2011  . Cataracts    . Tracheostomy tube placement  02/18/2012    Procedure: TRACHEOSTOMY;  Surgeon: Serena Colonel, MD;  Location: [facility] OR;  Service: ENT;  Laterality: N/A;  . Tracheostomy revision N/A 09/01/2012    Procedure: TRACHEOSTOMY REVISION;  Surgeon: Serena Colonel, MD;  Location: [facility] OR;  Service: ENT;  Laterality: N/A;    Family History  Problem Relation Age of Onset  . Heart attack Father   . Stroke Mother     CVA  . Coronary artery disease Daughter     Social History:  reports that she quit smoking about 12 months ago. Her smoking use included Cigarettes. She has a 55 pack-year smoking history. She has never used smokeless tobacco. She reports that she does not drink alcohol or use illicit drugs.  Allergies:  Allergies  Allergen Reactions  . Other Itching    "Wool"  . Sulfonamide Derivatives Hives and Rash    Medications: I have reviewed the patient's current medications.  Results for orders placed during the hospital  encounter of 08/20/12 (from the past 48 hour(s))  GLUCOSE, CAPILLARY     Status: Abnormal   Collection Time    09/10/12  7:36 PM      Result Value Range   Glucose-Capillary 142 (*) 70 - 99 mg/dL  GLUCOSE, CAPILLARY     Status: None   Collection Time    09/10/12 11:59 PM      Result Value Range   Glucose-Capillary 87  70 - 99 mg/dL  GLUCOSE, CAPILLARY     Status: None   Collection Time    09/11/12  4:03 AM      Result Value Range   Glucose-Capillary 94  70 - 99 mg/dL  BASIC METABOLIC PANEL     Status: Abnormal   Collection Time    09/11/12  6:40 AM      Result Value Range   Sodium 138  135 - 145 mEq/L   Potassium 4.6  3.5 - 5.1 mEq/L   Chloride 105  96 - 112 mEq/L    CO2 25  19 - 32 mEq/L   Glucose, Bld 90  70 - 99 mg/dL   BUN 46 (*) 6 - 23 mg/dL   Creatinine, Ser 4.09 (*) 0.50 - 1.10 mg/dL   Calcium 8.6  8.4 - 81.1 mg/dL   GFR calc non Af Amer 15 (*) >90 mL/min   GFR calc Af Amer 17 (*) >90 mL/min   Comment:            The eGFR has been calculated     using the CKD EPI equation.     This calculation has not been     validated in all clinical     situations.     eGFR's persistently     <90 mL/min signify     possible Chronic Kidney Disease.  GLUCOSE, CAPILLARY     Status: None   Collection Time    09/11/12  7:42 AM      Result Value Range   Glucose-Capillary 86  70 - 99 mg/dL   Comment 1 Notify RN    GLUCOSE, CAPILLARY     Status: Abnormal   Collection Time    09/11/12 12:12 PM      Result Value Range   Glucose-Capillary 103 (*) 70 - 99 mg/dL   Comment 1 Notify RN    GLUCOSE, CAPILLARY     Status: Abnormal   Collection Time    09/11/12  3:56 PM      Result Value Range   Glucose-Capillary 103 (*) 70 - 99 mg/dL  GLUCOSE, CAPILLARY     Status: Abnormal   Collection Time    09/11/12  7:49 PM      Result Value Range   Glucose-Capillary 115 (*) 70 - 99 mg/dL  GLUCOSE, CAPILLARY     Status: Abnormal   Collection Time    09/11/12 11:34 PM      Result Value Range   Glucose-Capillary 138 (*) 70 - 99 mg/dL   Comment 1 Documented in Chart     Comment 2 Notify RN    GLUCOSE, CAPILLARY     Status: None   Collection Time    09/12/12  4:31 AM      Result Value Range   Glucose-Capillary 85  70 - 99 mg/dL   Comment 1 Documented in Chart     Comment 2 Notify RN    PRO B NATRIURETIC PEPTIDE     Status: Abnormal   Collection Time  09/12/12  6:25 AM      Result Value Range   Pro B Natriuretic peptide (BNP) 4030.0 (*) 0 - 125 pg/mL  BASIC METABOLIC PANEL     Status: Abnormal   Collection Time    09/12/12  6:25 AM      Result Value Range   Sodium 137  135 - 145 mEq/L   Potassium 4.9  3.5 - 5.1 mEq/L   Chloride 106  96 - 112 mEq/L    CO2 24  19 - 32 mEq/L   Glucose, Bld 92  70 - 99 mg/dL   BUN 53 (*) 6 - 23 mg/dL   Creatinine, Ser 9.60 (*) 0.50 - 1.10 mg/dL   Calcium 8.6  8.4 - 45.4 mg/dL   GFR calc non Af Amer 13 (*) >90 mL/min   GFR calc Af Amer 15 (*) >90 mL/min   Comment:            The eGFR has been calculated     using the CKD EPI equation.     This calculation has not been     validated in all clinical     situations.     eGFR's persistently     <90 mL/min signify     possible Chronic Kidney Disease.  GLUCOSE, CAPILLARY     Status: None   Collection Time    09/12/12  8:04 AM      Result Value Range   Glucose-Capillary 81  70 - 99 mg/dL   Comment 1 Notify RN    GLUCOSE, CAPILLARY     Status: None   Collection Time    09/12/12 12:43 PM      Result Value Range   Glucose-Capillary 90  70 - 99 mg/dL  GLUCOSE, CAPILLARY     Status: Abnormal   Collection Time    09/12/12  3:50 PM      Result Value Range   Glucose-Capillary 167 (*) 70 - 99 mg/dL   Comment 1 Notify RN      No results found.  Review of Systems  HENT: Negative.   Eyes: Negative.   Respiratory: Negative.   Cardiovascular: Negative.   Gastrointestinal: Negative.   Genitourinary: Negative.   Musculoskeletal: Negative.   Skin: Negative.   Neurological: Negative.   Endo/Heme/Allergies: Negative.   Psychiatric/Behavioral: The patient is nervous/anxious.    Blood pressure 132/43, pulse 63, temperature 98.5 F (36.9 C), temperature source Oral, resp. rate 17, height 5\' 4"  (1.626 m), weight 77 kg (169 lb 12.1 oz), SpO2 98.00%. Physical Exam Completed by primary MD, reviewed  Family History:  No family history on file.   Mental Status Examination/Evaluation: Patient is disheveled sitting upright in her bed. She is irritable but cooperative today, denies any active or passive suicidal thoughts or homicidal thoughts.  Her thoughts are organized and goal directed, answers questions appropriately.  Alert and oriented x 3, knew where she was  and the date.  There is no paranoia or delusions present at this time. He denies any auditory or visual hallucination. Her attention and concentration are fair.  Her insight is fair to poor and judgment is fair.  DIAGNOSIS:  AXIS I  Adjustment disorder with depressed mood  AXIS II  Deferred   AXIS III  Multiple chronic medical issues (see above)  AXIS IV  other psychosocial or environmental problems, inability to perform ADLs, problems with access to health care services and problems with primary support group   AXIS V  61-70  mild symptoms    Assessment/Plan:  Recommend continue current psychiatric medication. Patient does not need inpatient psychiatric treatment. Recommend followup treatment at local mental health facility and placement in a Skilled Nursing Facility to meet her needs, if the patient and POA will agree. Contact Child psychotherapist for outpatient discharge planning.  Nanine Means, PMH-NP 09/12/2012, 6:18 PM   Patient alert, oriented, able to make an informed decision even though she shows poor judgement

## 2012-09-13 ENCOUNTER — Inpatient Hospital Stay (HOSPITAL_COMMUNITY): Payer: Medicare Other

## 2012-09-13 LAB — BASIC METABOLIC PANEL
BUN: 51 mg/dL — ABNORMAL HIGH (ref 6–23)
CO2: 23 mEq/L (ref 19–32)
Chloride: 106 mEq/L (ref 96–112)
Creatinine, Ser: 3.27 mg/dL — ABNORMAL HIGH (ref 0.50–1.10)

## 2012-09-13 LAB — IRON AND TIBC
Saturation Ratios: 73 % — ABNORMAL HIGH (ref 20–55)
TIBC: 225 ug/dL — ABNORMAL LOW (ref 250–470)

## 2012-09-13 LAB — FERRITIN: Ferritin: 35 ng/mL (ref 10–291)

## 2012-09-13 LAB — RETICULOCYTES: Retic Ct Pct: 3.3 % — ABNORMAL HIGH (ref 0.4–3.1)

## 2012-09-13 LAB — CBC
HCT: 22.1 % — ABNORMAL LOW (ref 36.0–46.0)
MCHC: 30.8 g/dL (ref 30.0–36.0)
MCV: 82.5 fL (ref 78.0–100.0)
RDW: 18.2 % — ABNORMAL HIGH (ref 11.5–15.5)

## 2012-09-13 LAB — VITAMIN B12: Vitamin B-12: 516 pg/mL (ref 211–911)

## 2012-09-13 LAB — GLUCOSE, CAPILLARY: Glucose-Capillary: 95 mg/dL (ref 70–99)

## 2012-09-13 MED ORDER — TORSEMIDE 20 MG PO TABS
20.0000 mg | ORAL_TABLET | Freq: Every day | ORAL | Status: DC
  Administered 2012-09-13 – 2012-09-14 (×2): 20 mg via ORAL
  Filled 2012-09-13 (×2): qty 1

## 2012-09-13 NOTE — Progress Notes (Signed)
Pt is an active patient with [facility].  Called to make sure they were aware of pt's needs at d/c and they were. HHRN will resume when pt returns home.

## 2012-09-13 NOTE — Progress Notes (Signed)
Occupational Therapy Treatment Patient Details Name: [name] MRN: [id number] DOB: [birth year] Today's Date: 09/13/2012 Time: 4098-1191 OT Time Calculation (min): 12 min  OT Assessment / Plan / Recommendation  History of present illness Pt admitted with worsening SOB. Pt with trach/ Passy-Muir valve    Clinical Impression Pt continues to report SOB and demos decreased endurance during functional tasks. Pt declined increased participation in ADLs and ADL mobility but still wants to return home. OT has encouraged pt to increase her participation level and explained consequences of not being OOB   OT comments  Pt requires encouragement   Follow Up Recommendations       Barriers to Discharge   pt wants to return home, however, is participating little in OT    Equipment Recommendations  None recommended by OT;Other (comment) (TBD)    Recommendations for Other Services    Frequency Min 2X/week   Progress towards OT Goals Progress towards OT goals: Progressing toward goals  Plan Discharge plan remains appropriate    Precautions / Restrictions Precautions Precautions: Fall Restrictions Weight Bearing Restrictions: No   Pertinent Vitals/Pain No c/o pain    ADL  Upper Body Dressing: Performed;Set up;Minimal assistance Where Assessed - Upper Body Dressing: Unsupported sitting Toilet Transfer: Performed;Minimal assistance Toilet Transfer Method: Sit to stand;Stand pivot Toilet Transfer Equipment: Bedside commode Toileting - Clothing Manipulation and Hygiene: Minimal assistance;Performed Where Assessed - Toileting Clothing Manipulation and Hygiene: Standing Transfers/Ambulation Related to ADLs: cues for correct hand placement ADL Comments: pt encouraged to increase participation level for ADLs and ADL mobiliity. Pt requesting to return to bed after minimal activity     OT Diagnosis:    OT Problem List:   OT Treatment Interventions:     OT Goals(current goals can now be  found in the care plan section) Acute Rehab OT Goals Patient Stated Goal: To return home  Visit Information  Last OT Received On: 09/13/12 History of Present Illness: Pt admitted with worsening SOB. Pt with trach/ Passy-Muir valve     Subjective Data      Prior Functioning       Cognition  Cognition Arousal/Alertness: Awake/alert Behavior During Therapy: WFL for tasks assessed/performed Overall Cognitive Status: Within Functional Limits for tasks assessed    Mobility  Bed Mobility Bed Mobility: Supine to Sit;Sitting - Scoot to Edge of Bed;Sit to Supine Supine to Sit: 5: Supervision Sitting - Scoot to Edge of Bed: 5: Supervision Sit to Supine: 5: Supervision Transfers Transfers: Sit to Stand;Stand to Sit Sit to Stand: 4: Min guard;With upper extremity assist;From bed Stand to Sit: 4: Min guard;With upper extremity assist;With armrests;To bed Details for Transfer Assistance: cues for hand placement and safe pivot to bed    Exercises      Balance     End of Session OT - End of Session Equipment Utilized During Treatment: Gait belt;Other (comment) (BSC) Activity Tolerance: Patient limited by fatigue Patient left: in bed;with call bell/phone within reach  GO     Galen Manila 09/13/2012, 10:39 AM

## 2012-09-13 NOTE — Progress Notes (Signed)
Pt was provided a list of Private Duty Care agencies and that list was explained. Pt nodded understanding. As it was her daughter's wish to get this info, I also called and spoke with daughter on telephone just now to explain that I had left that list in the patient's room.  Explained that the [facility] that was ordered was PT/OT/Resp/Aide through the agency which they were already active with, [facility]. Daughter stated understanding and denied any further questions. MD updated that this list had been provided as requested. [facility] called and advised of orders as well.

## 2012-09-13 NOTE — Progress Notes (Signed)
SLP Cancellation Note  Patient Details Name: DORTHA NEIGHBORS MRN: [id number] DOB: 05/11/1940   Cancelled treatment:   Pt declined to work with SLP.   Coughing, producing secretions, requesting suctioning, appears anxious.  Will seek clarification re: clearance to use PMSV.   Will f/u next date.          Blenda Mounts Laurice 09/13/2012, 2:13 PM

## 2012-09-13 NOTE — Progress Notes (Signed)
Patient ID: [name]  female  ZOX:[id number]    DOB: 14-Aug-1940    DOA: 08/20/2012  PCP: Gwynneth Aliment, MD  Interim summary and Hospital course so far:   46 F with chronic trach (since autumn of 2013) initially placed 11/23/11 by Dr Bary Richard for recurrent Intubation, initially for hypercarbic resp failure, then subsequently for vocal cord edema. She was re-admitted 12/03 - 12/08 after trach tube dislodged and could not be replaced. She refused repeat trach tube placement at that time. She was discharged to [facility] without replacement of the trach tube. She presented 12/23 to ED with PNA and while in ED suffered cardiac arrest for which she was again intubated during resuscitation. Dr Pollyann Kennedy performed repeat tracheostomy tube placement 02/14/12 and the tube has remained since that time.  She was seen in ED 6/07 with dislodgement of trach tube and it was replaced and she was discharged back to SNF. She was admitted on 6/ 29 for worsening shortness of breath likely due to COPD, mucus plugging, deconditioning, cor pulmonale and fluid overload leading to acute on chronic resp and heart failure. Patient was placed on IV diuresis. On 08/29/12, the RT noted that the trach tube appeared malpositioned with some blood in the tube's lumen.Pulmonary critical care medicine was consulted. The tube could not be replaced as there was no discernible stoma tract.  In the interim, palliative medicine was consulted as well for GOC, patient and her daughter however requested for continued aggressive care, full code. ENT, Dr. Pollyann Kennedy was consulted on 08/31/12, felt on exam that the trach site is healthy without any obvious mucus or air leak, so patient underwent tracheostomy revision on 09/01/12.  Patient was transferred out of ICU to the hospitalist service on 09/05/12. Dr. Pollyann Kennedy reevaluated the patient on 7/18 as patient had difficulty speaking and still dyspneic. Janina Mayo was changed to #6 cuffless. Per Dr. Lucky Rathke  recommendation, keep the P/M valve off for now due to dyspnea. Speech therapy has been following the patient and recommended frequent intermittent supervision when valve is in use and she needs to wear valve with PO intake.  Assessment/Plan:  Acute on chronic respiratory failure secondary to COPD, OSA/OHS, chronic Lt hemidiaphragm elevation.  S/p Trach revision on 09/01/12 by Dr Pollyann Kennedy (ENT).  Tracheostomy dependent  - continue ATC as tolerated, BD's and pulmicort  - appreciate Dr. Pollyann Kennedy for reevaluating patient, Janina Mayo changed to #6 cuffless, patient able to speak more clearly.  Dyspnea - Elevated BNP at 8344 on 7/18, now down to 4030  today but creatinine trended up to 3.2  - HOLD lasix  -  Dr. Pollyann Kennedy recommended to keep the PM valve off until dyspnea improves  - Monitor creatinine closely, I/O's negative balance  Hx of HTN, hyperlipidemia  -Cont prior regimen   Stage IV CKD.  - Monitor creatinine function closely  Dysphagia.  -Diet per SLP recs, tolerating her diet   Anemia of chronic disease.  -f/u CBC - transfuse if <7 or unstable  -continue FeSO4   DM type II.  -Cont SSI   Hx of Depression, CVA.  -continue zoloft, buspar   DVT Prophylaxis:  Code Status: full  Disposition:  I discussed with patient's daughter, Sunday Corn in detail yesterday, she refused SNF as well but requested [facility] RN everyday, somebody to take care of trach, home health aid. She works MWF 9-about 8:45pm. Daughter has her own medical issues and needs HD three times a week. I am concerned about patient's home situation. D/w Dr  Jacky Kindle regarding difficulty about safe disposition for [name]. Patient does not have 24/7 supervision at home. Her daughter is requesting [facility] everyday, someone for trach care, HHA for bathing etc which is probably not feasible with home health. She herself (daughter) has medical issues, works and is on HD.    Patient has been in the hospital for 24 days. Appears comfortable.  Per psychiatry, has capacity for informed consent. Will repeat CBC, BMET, pro BNP, chest x-ray. If stable , discharge tomorrow. Appears to have reached maximal hospital benefit and refusing skilled nursing facility placement. I spoke with patient's daughter to report possible discharge tomorrow if stable. Initially, patient yelled into the phone "my mom is not going to a facility!" Eventually lowered her voice and we spoke about home health nursing, respiratory, PT, OT, aide, but that 24-hour supervision would require private duty aide. Have ordered home services.  Subjective: Initially has no complaints. When told she is close to discharge, she said "I don't feel good". Would not elaborate further. When I pushed her, she eventually situation or breath. Still refusing skilled nursing facility placement "because my daughter doesn't want me to go". Nurse reports that patient has been stable.  Objective: Weight change:   Intake/Output Summary (Last 24 hours) at 09/13/12 1141 Last data filed at 09/13/12 0839  Gross per 24 hour  Intake   1080 ml  Output    725 ml  Net    355 ml   Blood pressure 149/55, pulse 69, temperature 98 F (36.7 C), temperature source Oral, resp. rate 16, height 5\' 4"  (1.626 m), weight 77 kg (169 lb 12.1 oz), SpO2 97.00%.  Physical Exam: General: Asleep. Comfortable. Breathing nonlabored. Neck: Trach  CVS: S1-S2 clear, no murmur rubs or gallops Chest: Clear to auscultation bilaterally without wheeze rhonchi or rale Abdomen: soft NT, ND, NBS  Extremities: no c/c/e bilaterally  Lab Results: Basic Metabolic Panel:  Recent Labs Lab 09/11/12 0640 09/12/12 0625  NA 138 137  K 4.6 4.9  CL 105 106  CO2 25 24  GLUCOSE 90 92  BUN 46* 53*  CREATININE 2.91* 3.27*  CALCIUM 8.6 8.6   CBC:  Recent Labs Lab 09/07/12 0600  WBC 9.1  HGB 8.0*  HCT 25.4*  MCV 81.9  PLT 186   Cardiac Enzymes: No results found for this basename: CKTOTAL, CKMB, CKMBINDEX, TROPONINI,   in the last 168 hours BNP: No components found with this basename: POCBNP,  CBG:  Recent Labs Lab 09/12/12 1550 09/12/12 1952 09/13/12 0045 09/13/12 0442 09/13/12 0750  GLUCAP 167* 93 106* 96 89     Micro Results: No results found for this or any previous visit (from the past 240 hour(s)).  Studies/Results: Dg Chest 2 View  08/20/2012   *RADIOLOGY REPORT*  Clinical Data: Shortness of breath.  Hypertension.  Cardiomyopathy.  CHEST - 2 VIEW  Comparison: 07/29/2012  Findings: Tracheostomy appropriately positioned.  Moderate cardiomegaly with atherosclerosis in the transverse aorta.  No definite pleural fluid. No pneumothorax.  No congestive failure.  Mild right base volume loss and subsegmental atelectasis.  Slight improvement in left base aeration. Probable subsegmental atelectasis remaining.  Moderate left hemidiaphragm elevation.  IMPRESSION: Cardiomegaly and moderate left hemidiaphragm elevation.  Bibasilar atelectasis without convincing evidence of acute superimposed process.   Original Report Authenticated By: Jeronimo Greaves, M.D.   Dg Chest Port 1 View  09/03/2012   *RADIOLOGY REPORT*  Clinical Data: Respiratory failure and tracheostomy.  PORTABLE CHEST - 1 VIEW  Comparison: 08/24/2012  Findings: Tracheostomy is grossly stable.  There remains dense consolidation of the left lower lobe with potentially a component of left pleural fluid.  Atelectasis present at the right lung base. No pulmonary edema is seen.  The heart size is stable.  IMPRESSION: Dense left lower lobe consolidation with potential left pleural fluid.   Original Report Authenticated By: Irish Lack, M.D.   Dg Chest Port 1 View  08/24/2012   *RADIOLOGY REPORT*  Clinical Data: Shortness of breath.  PORTABLE CHEST - 1 VIEW  Comparison: Chest x-ray 08/20/2012.  Findings: Mild elevation of the left hemidiaphragm.  Poor visualization of the left base may be secondary to underpenetration of the film, however, there is suspected  atelectasis and/or consolidation left lower lobe, likely with superimposed small left pleural effusion.  Pulmonary venous congestion, without frank pulmonary edema.  Mild cardiomegaly is unchanged. The patient is rotated to the left on today's exam, resulting in distortion of the mediastinal contours and reduced diagnostic sensitivity and specificity for mediastinal pathology.  Atherosclerosis in the thoracic aorta.  Tracheostomy tube is in position with tip approximately 6.2 cm above the carina.  IMPRESSION: 1.  Worsening aeration in the left base favored to reflecting increasing atelectasis and/or consolidation, now with superimposed small left pleural effusion.  Chronic elevation of the left hemidiaphragm is unchanged. 2.  Mild cardiomegaly with pulmonary venous congestion, but no frank pulmonary edema. 3.  Atherosclerosis.   Original Report Authenticated By: Trudie Reed, M.D.    Medications: Scheduled Meds: . ipratropium  0.5 mg Nebulization Q6H   And  . albuterol  2.5 mg Nebulization Q6H  . amLODipine  10 mg Oral Daily  . aspirin  81 mg Oral Daily  . atorvastatin  40 mg Oral Daily  . budesonide  0.25 mg Nebulization Q6H  . busPIRone  5 mg Oral BID  . carvedilol  12.5 mg Oral BID WC  . cloNIDine  0.1 mg Oral BID  . clopidogrel  75 mg Oral Q breakfast  . enoxaparin (LOVENOX) injection  30 mg Subcutaneous Q24H  . ferrous sulfate  325 mg Oral BID  . hydrALAZINE  100 mg Oral TID  . insulin aspart  0-9 Units Subcutaneous Q4H  . isosorbide mononitrate  30 mg Oral Daily  . metoCLOPramide  5 mg Oral QID  . pantoprazole  40 mg Oral Daily  . sertraline  100 mg Oral QHS  . sodium chloride  3 mL Intravenous Q12H  . vitamin C  500 mg Oral Daily    LOS: 24 days   Christiane Ha M.D. [facility] 09/13/2012, 11:41 AM Pager: [phone]  If 7PM-7AM, please contact night-coverage [email or web address] Password [email or web address]

## 2012-09-14 LAB — GLUCOSE, CAPILLARY
Glucose-Capillary: 87 mg/dL (ref 70–99)
Glucose-Capillary: 81 mg/dL (ref 70–99)
Glucose-Capillary: 113 mg/dL — ABNORMAL HIGH (ref 70–99)
Glucose-Capillary: 133 mg/dL — ABNORMAL HIGH (ref 70–99)

## 2012-09-14 LAB — BASIC METABOLIC PANEL
BUN: 48 mg/dL — ABNORMAL HIGH (ref 6–23)
CO2: 22 mEq/L (ref 19–32)
Calcium: 9 mg/dL (ref 8.4–10.5)
Chloride: 107 mEq/L (ref 96–112)
Creatinine, Ser: 3.14 mg/dL — ABNORMAL HIGH (ref 0.50–1.10)
Glucose, Bld: 88 mg/dL (ref 70–99)

## 2012-09-14 LAB — TYPE AND SCREEN: Unit division: 0

## 2012-09-14 MED ORDER — ALBUTEROL SULFATE (5 MG/ML) 0.5% IN NEBU: 2.5000 mg | INHALATION_SOLUTION | Freq: Four times a day (QID) | RESPIRATORY_TRACT | Status: DC

## 2012-09-14 MED ORDER — LORAZEPAM 0.5 MG PO TABS: 0.5000 mg | ORAL_TABLET | Freq: Four times a day (QID) | ORAL | Status: AC | PRN

## 2012-09-14 MED ORDER — IPRATROPIUM BROMIDE 0.02 % IN SOLN: 0.5000 mg | Freq: Four times a day (QID) | RESPIRATORY_TRACT | Status: DC

## 2012-09-14 NOTE — Discharge Summary (Signed)
Physician Discharge Summary  [name] ZOX:[id number] DOB: 08-08-1940 DOA: 08/20/2012  PCP: Gwynneth Aliment, MD  Admit date: 08/20/2012 Discharge date: 09/14/2012  Time spent: greater than 30 minutes  Discharge Diagnoses:  Active Problems:   Chronic cor pulmonale   C O P D   Chronic kidney disease (CKD), stage IV (severe)   Acute-on-chronic respiratory failure   Tracheostomy status   SEMI (subendocardial myocardial infarction)   Acute on chronic combined systolic and diastolic CHF (congestive heart failure)   Acute on chronic renal failure   Obesity, unspecified   Tracheostomy complication Chronic dyspnea   Weakness generalized   Discharge Condition: stable  Filed Weights   09/01/12 0623 09/03/12 0300 09/04/12 0403  Weight: 83.689 kg (184 lb 8 oz) 80.2 kg (176 lb 12.9 oz) 77 kg (169 lb 12.1 oz)    History of present illness:  72 y.o. female w/ h/o cardio pulmonary arrest in 01/2012 , s/p trach, s/p recent admission for acute on chronic respiratory failure from 07/08/2012-07/17/2012. The patient has a very competent medical history including COPD / OSA / prev SNF pt that had hypercarbic respiratory failure and had issues of recurrent intubation That underwent percutaneous tracheostomy 11/23/11 by Dr. Tyson Alias Had trach dislodgement 01/2012 w/ track closure. Refused trach -redo but then suffered asystolic cardiac arrest. She was recently seen in the emergency department on 07/29/2012 with dislodgment of her tracheostomy tube. It was reinserted and the patient was sent home.  The patient presents today with two-day history of worsening shortness of breath. The patient endorses compliance with cleaning her Passy-Muir valve and suctioning with which she has had compliance issues with in the past leading to chronic mucous plugging and respiratory distress. However, the patient states that she has been drinking more fluids than usual, and states that she feels that she has gained a  few pounds, but cannot quantify,. She endorses compliance with all her other medications. She denies any fevers but complains of subjective chills. She complains of increasing cough without hemoptysis. She denies any chest discomfort, vomiting, diarrhea, abdominal pain, dysuria. She does have some chronic nausea although she feels hungry this morning. In emergency department, the patient was given 40 mg of intravenous furosemide, but the patient stated that she continued to be short of breath. As a result, [facility] was asked to admit the patient. At the time of my evaluation, the patient was not in any respiratory distress although she did complain of continued shortness of breath. She stated that it was 25% better than the past few days. Additional labs revealed WBC 9.2, serum creatinine 2.8 to, proBNP 10,275. Her serum creatinine and proBNP are higher than baseline. On the day of discharge on 07/17/2012, the patient had a weight of 82.3 kg. She has not yet had a weight performed during this admission.   Hospital Course:  She was admitted on 6/ 29 for worsening shortness of breath likely due to COPD, mucus plugging, deconditioning, cor pulmonale and fluid overload leading to acute on chronic resp and heart failure. Patient was placed on IV diuresis.  On 08/29/12, the RT noted that the trach tube appeared malpositioned with some blood in the tube's lumen.Pulmonary critical care medicine was consulted. The tube could not be replaced as there was no discernible stoma tract.  In the interim, palliative medicine was consulted as well for GOC, patient and her daughter however requested for continued aggressive care, full code.  ENT, Dr. Pollyann Kennedy was consulted on 08/31/12, felt on exam that the  trach site is healthy without any obvious mucus or air leak, so patient underwent tracheostomy revision on 09/01/12.  Patient was transferred out of ICU to the hospitalist service on 09/05/12. Dr. Pollyann Kennedy reevaluated the patient on 7/18  as patient had difficulty speaking and still dyspneic. Janina Mayo was changed to #6 cuffless. Per Dr. Lucky Rathke recommendation, keep the P/M valve off for now due to dyspnea.  Speech therapy has been following the patient and recommended frequent intermittent supervision when valve is in use and she needs to wear valve with PO intake.  Has chronic, multifactorial dyspnea. Transfused for chronic anemia to help with dyspnea.  At the time of discharge, stable vital signs, tolerating diet, at clinical baseline.  Poorly motivated to work with PT, OT, RT Has refused placement. Psych consulted and feels patient has capacity for informed consent.  Therefore patient discharged home with home health PT,OT, RT, aide and SW. Lives with daughter. List of private duty aide serves provided  Procedures:  Tracheostomy revision by Dr. Serena Colonel on 09/01/12  Consultations:  ENT, CCM, palliative care medicine, psychiatry  Discharge Exam: Filed Vitals:   09/14/12 0248 09/14/12 0426 09/14/12 0516 09/14/12 0746  BP:  162/61    Pulse:  60 63 64  Temp:  98.6 F (37 C)    TempSrc:  Oral    Resp:  18 20 18   Height:      Weight:      SpO2: 96% 98% 98% 97%    General: comfortable Cardiovascular: RRR without MGR Respiratory: CTA without WRR  Discharge Instructions  Discharge Orders   Future Orders Complete By Expires     Activity as tolerated - No restrictions  As directed     Diet - low sodium heart healthy  As directed     Discharge instructions  As directed     Comments:      Keep trach valve off unless eating or talking.        Medication List         albuterol (5 MG/ML) 0.5% nebulizer solution  Commonly known as:  PROVENTIL  Take 0.5 mLs (2.5 mg total) by nebulization 4 (four) times daily.     amLODipine 10 MG tablet  Commonly known as:  NORVASC  Take 10 mg by mouth daily.     arformoterol 15 MCG/2ML Nebu  Commonly known as:  BROVANA  Take 2 mLs (15 mcg total) by nebulization 2 (two) times  daily.     aspirin 81 MG chewable tablet  Chew 1 tablet (81 mg total) by mouth daily.     atorvastatin 40 MG tablet  Commonly known as:  LIPITOR  Take 1 tablet (40 mg total) by mouth daily.     BIOTENE MOISTURIZING MOUTH Soln  Use as directed 1 spray in the mouth or throat daily.     budesonide 0.25 MG/2ML nebulizer solution  Commonly known as:  PULMICORT  Take 2 mLs (0.25 mg total) by nebulization 2 (two) times daily.     busPIRone 5 MG tablet  Commonly known as:  BUSPAR  Take 5 mg by mouth 2 (two) times daily.     carvedilol 6.25 MG tablet  Commonly known as:  COREG  Take 1 tablet (6.25 mg total) by mouth 2 (two) times daily with a meal.     cloNIDine 0.2 MG tablet  Commonly known as:  CATAPRES  Take 0.5 tablets (0.1 mg total) by mouth 2 (two) times daily.     clopidogrel 75  MG tablet  Commonly known as:  PLAVIX  Take 1 tablet (75 mg total) by mouth daily with breakfast.     dextromethorphan-guaiFENesin 30-600 MG per 12 hr tablet  Commonly known as:  MUCINEX DM  Take 1 tablet by mouth 2 (two) times daily.     ferrous sulfate 325 (65 FE) MG EC tablet  Take 1 tablet (325 mg total) by mouth 2 (two) times daily.     hydrALAZINE 100 MG tablet  Commonly known as:  APRESOLINE  Take 100 mg by mouth 3 (three) times daily.     ipratropium 0.02 % nebulizer solution  Commonly known as:  ATROVENT  Take 2.5 mLs (0.5 mg total) by nebulization 4 (four) times daily.     isosorbide mononitrate 30 MG 24 hr tablet  Commonly known as:  IMDUR  Take 1 tablet (30 mg total) by mouth daily.     LORazepam 0.5 MG tablet  Commonly known as:  ATIVAN  Take 1 tablet (0.5 mg total) by mouth every 6 (six) hours as needed for anxiety.     metoCLOPramide 5 MG tablet  Commonly known as:  REGLAN  Take 5 mg by mouth 4 (four) times daily.     oxycodone 5 MG capsule  Commonly known as:  OXY-IR  Take 5 mg by mouth every 6 (six) hours as needed (for moderate pain).     pantoprazole 20 MG tablet   Commonly known as:  PROTONIX  Take 40 mg by mouth daily.     sertraline 100 MG tablet  Commonly known as:  ZOLOFT  Take 100 mg by mouth at bedtime.     torsemide 20 MG tablet  Commonly known as:  DEMADEX  Take 20 mg by mouth daily.     VITAMIN C PO  Take 1 tablet by mouth daily.       Allergies  Allergen Reactions  . Other Itching    "Wool"  . Sulfonamide Derivatives Hives and Rash       Follow-up Information   Follow up with Sandrea Hughs, MD On 09/11/2012. (f/u at 1115am. )    Contact information:   [address] [place] [address] [phone]        The results of significant diagnostics from this hospitalization (including imaging, microbiology, ancillary and laboratory) are listed below for reference.    Significant Diagnostic Studies: Dg Chest 2 View  09/08/2012   *RADIOLOGY REPORT*  Clinical Data: Shortness of breath.  CHEST - 2 VIEW  Comparison: 09/03/2012  Findings: Tracheostomy appears stable in position.  Consolidation of the left lower lobe shows improvement since the prior chest x- ray.  There is no overt pulmonary edema.  There is stable cardiomegaly.  IMPRESSION: Improved aeration of the left lower lobe since the prior chest x- ray.   Original Report Authenticated By: Irish Lack, M.D.   Dg Chest 2 View  08/20/2012   *RADIOLOGY REPORT*  Clinical Data: Shortness of breath.  Hypertension.  Cardiomyopathy.  CHEST - 2 VIEW  Comparison: 07/29/2012  Findings: Tracheostomy appropriately positioned.  Moderate cardiomegaly with atherosclerosis in the transverse aorta.  No definite pleural fluid. No pneumothorax.  No congestive failure.  Mild right base volume loss and subsegmental atelectasis.  Slight improvement in left base aeration. Probable subsegmental atelectasis remaining.  Moderate left hemidiaphragm elevation.  IMPRESSION: Cardiomegaly and moderate left hemidiaphragm elevation.  Bibasilar atelectasis without convincing evidence of acute superimposed  process.   Original Report Authenticated By: Jeronimo Greaves, M.D.  Dg Chest Port 1 View  09/13/2012   *RADIOLOGY REPORT*  Clinical Data: Shortness of breath.  Chronic respiratory failure.  PORTABLE CHEST - 1 VIEW  Comparison: 09/08/2012  Findings: Tracheostomy tube is unchanged.  Cardiomegaly.  Left lower lobe atelectasis or infiltrate appears increased since prior study.  No confluent opacity on the right.  No right effusion. Cannot exclude left effusion.  No acute bony abnormality.  IMPRESSION: Increasing left lower lobe atelectasis or infiltrate.  Cannot exclude a component of effusion.   Original Report Authenticated By: Charlett Nose, M.D.   Dg Chest Port 1 View  09/03/2012   *RADIOLOGY REPORT*  Clinical Data: Respiratory failure and tracheostomy.  PORTABLE CHEST - 1 VIEW  Comparison: 08/24/2012  Findings: Tracheostomy is grossly stable.  There remains dense consolidation of the left lower lobe with potentially a component of left pleural fluid.  Atelectasis present at the right lung base. No pulmonary edema is seen.  The heart size is stable.  IMPRESSION: Dense left lower lobe consolidation with potential left pleural fluid.   Original Report Authenticated By: Irish Lack, M.D.   Dg Chest Port 1 View  08/24/2012   *RADIOLOGY REPORT*  Clinical Data: Shortness of breath.  PORTABLE CHEST - 1 VIEW  Comparison: Chest x-ray 08/20/2012.  Findings: Mild elevation of the left hemidiaphragm.  Poor visualization of the left base may be secondary to underpenetration of the film, however, there is suspected atelectasis and/or consolidation left lower lobe, likely with superimposed small left pleural effusion.  Pulmonary venous congestion, without frank pulmonary edema.  Mild cardiomegaly is unchanged. The patient is rotated to the left on today's exam, resulting in distortion of the mediastinal contours and reduced diagnostic sensitivity and specificity for mediastinal pathology.  Atherosclerosis in the thoracic  aorta.  Tracheostomy tube is in position with tip approximately 6.2 cm above the carina.  IMPRESSION: 1.  Worsening aeration in the left base favored to reflecting increasing atelectasis and/or consolidation, now with superimposed small left pleural effusion.  Chronic elevation of the left hemidiaphragm is unchanged. 2.  Mild cardiomegaly with pulmonary venous congestion, but no frank pulmonary edema. 3.  Atherosclerosis.   Original Report Authenticated By: Trudie Reed, M.D.    Microbiology: No results found for this or any previous visit (from the past 240 hour(s)).   Labs: Basic Metabolic Panel:  Recent Labs Lab 09/10/12 0630 09/11/12 0640 09/12/12 0625 09/13/12 1215 09/14/12 0515  NA 136 138 137 137 138  K 4.6 4.6 4.9 4.8 4.4  CL 105 105 106 106 107  CO2 24 25 24 23 22   GLUCOSE 91 90 92 102* 88  BUN 45* 46* 53* 51* 48*  CREATININE 2.74* 2.91* 3.27* 3.27* 3.14*  CALCIUM 8.8 8.6 8.6 8.5 9.0   Liver Function Tests: No results found for this basename: AST, ALT, ALKPHOS, BILITOT, PROT, ALBUMIN,  in the last 168 hours No results found for this basename: LIPASE, AMYLASE,  in the last 168 hours No results found for this basename: AMMONIA,  in the last 168 hours CBC:  Recent Labs Lab 09/13/12 1215 09/14/12 0515  WBC 6.2  --   HGB 6.8* 8.4*  HCT 22.1* 25.7*  MCV 82.5  --   PLT 136*  --    Cardiac Enzymes: No results found for this basename: CKTOTAL, CKMB, CKMBINDEX, TROPONINI,  in the last 168 hours BNP: BNP (last 3 results)  Recent Labs  09/08/12 1509 09/12/12 0625 09/13/12 1209  PROBNP 8344.0* 4030.0* 4002.0*   CBG:  Recent  Labs Lab 09/13/12 1637 09/13/12 2035 09/13/12 2335 09/14/12 0424 09/14/12 0807  GLUCAP 109* 132* 95 87 81   Signed:  Cordney Barstow L  [facility] 09/14/2012, 10:48 AM

## 2012-09-14 NOTE — Progress Notes (Addendum)
Passy-Muir Speaking Valve - Treatment Patient Details  Name: [name] MRN: [id number] Date of Birth: 1940-07-29  Today's Date: 09/14/2012 Time: 4696-2952 SLP Time Calculation (min): 8 min  Past Medical History:  Past Medical History  Diagnosis Date  . COPD (chronic obstructive pulmonary disease)     on home o2  . Chronic renal insufficiency   . CVA (cerebral infarction)     hx  . Hypertensive heart disease with congestive heart failure   . Chronic cor pulmonale   . Cardiomyopathy of undetermined type 09/07/2011  . Chronic kidney disease (CKD), stage IV (severe)   . Hyperlipdemia   . Obesity (BMI 30-39.9)   . Sleep apnea   . History of gout 09/07/2011  . Heart failure 7/13  . Shortness of breath   . Anemia   . Vascular disease   . Asthma   . Anginal pain   . Hypertension   . Type II or unspecified type diabetes mellitus without mention of complication, not stated as uncontrolled   . Stroke   . GERD (gastroesophageal reflux disease)   . Arthritis   . Bundle branch block   . Thrombocytopenia   . Acute and chronic respiratory failure   . Obesity, unspecified 08/28/2012   Past Surgical History:  Past Surgical History  Procedure Laterality Date  . Bilateral oophorectomy    . Cardiac catheterization      right heart cath  . Laparoscopic gastrostomy  12/21/2011    Procedure: LAPAROSCOPIC GASTROSTOMY;  Surgeon: Axel Filler, MD;  Location: [facility] OR;  Service: General;  Laterality: N/A;  laparoscopic gastrostomy possible open feeding tube  . Abdominal hysterectomy    . Tracheostomy  11/2011  . Cataracts    . Tracheostomy tube placement  02/18/2012    Procedure: TRACHEOSTOMY;  Surgeon: Serena Colonel, MD;  Location: [facility] OR;  Service: ENT;  Laterality: N/A;  . Tracheostomy revision N/A 09/01/2012    Procedure: TRACHEOSTOMY REVISION;  Surgeon: Serena Colonel, MD;  Location: [facility] OR;  Service: ENT;  Laterality: N/A;    Assessment / Plan / Recommendation Clinical Impression  Pt for D/C this evening.  F/u to determine toleration of PMSV.  Pt resting in bed; placed valve and removed independently.  Stated she does not like to use valve because it makes her short of breath.  Toleration appeared adequate - pt not dyspneic during visit.  PO toleration also improved since admission.  Discussed precautions, advantages of valve for eating/swallowing, and necessity of removing valve when SOB.  Pt appeared irritable, states she is ready to go home, and independently removed valve upon my departure.     Plan  Discharge SLP treatment due to goal met/ D/C home  Follow Up Recommendations  No f/u for PMV nor swallowing    Pertinent Vitals/Pain No c/o pain    SLP Goals SLP Goal #1: Pt. will increase intelligibility utilizing speech strategies wearing PMSV with RR, HR and Sp02 stable with min verbal/visual cues. SLP Goal #1 - Progress: Met   PMSV Trial  PMSV was placed for: intermittently Able to redirect subglottic air through upper airway: Yes Able to Attain Phonation: Yes Voice Quality: Hoarse Able to Expectorate Secretions: Yes Level of Secretion Expectoration with PMSV: Oral Breath Support for Phonation: Moderately decreased Intelligibility: Intelligible Word: 75-100% accurate Sentence: 75-100% accurate Conversation: 75-100% accurate SpO2 During Trial: 96 % Behavior: Anxious;Alert          Vent Dependency  FiO2 (%): 28 %  Blenda Mounts Laurice 09/14/2012, 4:15 PM

## 2012-09-25 ENCOUNTER — Other Ambulatory Visit: Payer: Self-pay | Admitting: Internal Medicine

## 2012-10-05 ENCOUNTER — Telehealth: Payer: Self-pay | Admitting: Cardiovascular Disease

## 2012-10-05 NOTE — Telephone Encounter (Signed)
Pt has never established in the office.  She was seen by Dr Shirlee Latch and Dr Elease Hashimoto for eval and cath in the hospital. Pt has had  [facility] post hosp app and they were canceled.  [facility] was notified.

## 2012-10-05 NOTE — Telephone Encounter (Signed)
New Prob     States pt is having her weight monitored. Pt has gained 3 lbs since yesterday. Today's weight is 179 lbs. Pt has gradually increased her weight since 8/8 (pt weighed 170 lbs). [facility] would like to speak to a nurse regarding pts coondition.

## 2012-10-06 ENCOUNTER — Emergency Department (HOSPITAL_COMMUNITY): Payer: Medicare Other

## 2012-10-06 ENCOUNTER — Inpatient Hospital Stay (HOSPITAL_COMMUNITY)
Admission: EM | Admit: 2012-10-06 | Discharge: 2012-10-20 | DRG: 291 | Disposition: A | Discharge status: Home Health | Payer: Medicare Other | Attending: Internal Medicine | Admitting: Internal Medicine

## 2012-10-06 ENCOUNTER — Encounter (HOSPITAL_COMMUNITY): Payer: Self-pay | Admitting: *Deleted

## 2012-10-06 DIAGNOSIS — G9340 Encephalopathy, unspecified: Secondary | ICD-10-CM

## 2012-10-06 DIAGNOSIS — I214 Non-ST elevation (NSTEMI) myocardial infarction: Secondary | ICD-10-CM

## 2012-10-06 DIAGNOSIS — M109 Gout, unspecified: Secondary | ICD-10-CM | POA: Diagnosis present

## 2012-10-06 DIAGNOSIS — J4489 Other specified chronic obstructive pulmonary disease: Secondary | ICD-10-CM | POA: Diagnosis present

## 2012-10-06 DIAGNOSIS — R531 Weakness: Secondary | ICD-10-CM

## 2012-10-06 DIAGNOSIS — E669 Obesity, unspecified: Secondary | ICD-10-CM

## 2012-10-06 DIAGNOSIS — I4510 Unspecified right bundle-branch block: Secondary | ICD-10-CM | POA: Diagnosis present

## 2012-10-06 DIAGNOSIS — I279 Pulmonary heart disease, unspecified: Secondary | ICD-10-CM

## 2012-10-06 DIAGNOSIS — G4733 Obstructive sleep apnea (adult) (pediatric): Secondary | ICD-10-CM | POA: Diagnosis present

## 2012-10-06 DIAGNOSIS — I252 Old myocardial infarction: Secondary | ICD-10-CM

## 2012-10-06 DIAGNOSIS — J9610 Chronic respiratory failure, unspecified whether with hypoxia or hypercapnia: Secondary | ICD-10-CM

## 2012-10-06 DIAGNOSIS — I5040 Unspecified combined systolic (congestive) and diastolic (congestive) heart failure: Secondary | ICD-10-CM | POA: Diagnosis present

## 2012-10-06 DIAGNOSIS — Z87891 Personal history of nicotine dependence: Secondary | ICD-10-CM

## 2012-10-06 DIAGNOSIS — R5381 Other malaise: Secondary | ICD-10-CM | POA: Diagnosis present

## 2012-10-06 DIAGNOSIS — I428 Other cardiomyopathies: Secondary | ICD-10-CM | POA: Diagnosis present

## 2012-10-06 DIAGNOSIS — M1990 Unspecified osteoarthritis, unspecified site: Secondary | ICD-10-CM | POA: Diagnosis present

## 2012-10-06 DIAGNOSIS — Z9981 Dependence on supplemental oxygen: Secondary | ICD-10-CM

## 2012-10-06 DIAGNOSIS — Z930 Tracheostomy status: Secondary | ICD-10-CM

## 2012-10-06 DIAGNOSIS — I4729 Other ventricular tachycardia: Secondary | ICD-10-CM | POA: Diagnosis present

## 2012-10-06 DIAGNOSIS — Z8674 Personal history of sudden cardiac arrest: Secondary | ICD-10-CM

## 2012-10-06 DIAGNOSIS — F17200 Nicotine dependence, unspecified, uncomplicated: Secondary | ICD-10-CM

## 2012-10-06 DIAGNOSIS — I452 Bifascicular block: Secondary | ICD-10-CM

## 2012-10-06 DIAGNOSIS — E785 Hyperlipidemia, unspecified: Secondary | ICD-10-CM

## 2012-10-06 DIAGNOSIS — N189 Chronic kidney disease, unspecified: Secondary | ICD-10-CM

## 2012-10-06 DIAGNOSIS — Z515 Encounter for palliative care: Secondary | ICD-10-CM

## 2012-10-06 DIAGNOSIS — I130 Hypertensive heart and chronic kidney disease with heart failure and stage 1 through stage 4 chronic kidney disease, or unspecified chronic kidney disease: Principal | ICD-10-CM | POA: Diagnosis present

## 2012-10-06 DIAGNOSIS — I5043 Acute on chronic combined systolic (congestive) and diastolic (congestive) heart failure: Secondary | ICD-10-CM

## 2012-10-06 DIAGNOSIS — D649 Anemia, unspecified: Secondary | ICD-10-CM

## 2012-10-06 DIAGNOSIS — J449 Chronic obstructive pulmonary disease, unspecified: Secondary | ICD-10-CM

## 2012-10-06 DIAGNOSIS — I472 Ventricular tachycardia, unspecified: Secondary | ICD-10-CM | POA: Diagnosis present

## 2012-10-06 DIAGNOSIS — E1159 Type 2 diabetes mellitus with other circulatory complications: Secondary | ICD-10-CM

## 2012-10-06 DIAGNOSIS — J189 Pneumonia, unspecified organism: Secondary | ICD-10-CM

## 2012-10-06 DIAGNOSIS — I2789 Other specified pulmonary heart diseases: Secondary | ICD-10-CM | POA: Diagnosis present

## 2012-10-06 DIAGNOSIS — J9620 Acute and chronic respiratory failure, unspecified whether with hypoxia or hypercapnia: Secondary | ICD-10-CM | POA: Diagnosis present

## 2012-10-06 DIAGNOSIS — K7460 Unspecified cirrhosis of liver: Secondary | ICD-10-CM

## 2012-10-06 DIAGNOSIS — I5023 Acute on chronic systolic (congestive) heart failure: Secondary | ICD-10-CM

## 2012-10-06 DIAGNOSIS — J9500 Unspecified tracheostomy complication: Secondary | ICD-10-CM

## 2012-10-06 DIAGNOSIS — Z8673 Personal history of transient ischemic attack (TIA), and cerebral infarction without residual deficits: Secondary | ICD-10-CM

## 2012-10-06 DIAGNOSIS — E119 Type 2 diabetes mellitus without complications: Secondary | ICD-10-CM | POA: Diagnosis present

## 2012-10-06 DIAGNOSIS — K219 Gastro-esophageal reflux disease without esophagitis: Secondary | ICD-10-CM | POA: Diagnosis present

## 2012-10-06 DIAGNOSIS — I2510 Atherosclerotic heart disease of native coronary artery without angina pectoris: Secondary | ICD-10-CM

## 2012-10-06 DIAGNOSIS — Z6830 Body mass index (BMI) 30.0-30.9, adult: Secondary | ICD-10-CM

## 2012-10-06 DIAGNOSIS — J45909 Unspecified asthma, uncomplicated: Secondary | ICD-10-CM

## 2012-10-06 DIAGNOSIS — N184 Chronic kidney disease, stage 4 (severe): Secondary | ICD-10-CM | POA: Diagnosis present

## 2012-10-06 DIAGNOSIS — D638 Anemia in other chronic diseases classified elsewhere: Secondary | ICD-10-CM | POA: Diagnosis present

## 2012-10-06 DIAGNOSIS — E662 Morbid (severe) obesity with alveolar hypoventilation: Secondary | ICD-10-CM | POA: Diagnosis present

## 2012-10-06 DIAGNOSIS — R0600 Dyspnea, unspecified: Secondary | ICD-10-CM | POA: Diagnosis present

## 2012-10-06 DIAGNOSIS — I509 Heart failure, unspecified: Secondary | ICD-10-CM | POA: Diagnosis present

## 2012-10-06 DIAGNOSIS — I110 Hypertensive heart disease with heart failure: Secondary | ICD-10-CM

## 2012-10-06 DIAGNOSIS — E875 Hyperkalemia: Secondary | ICD-10-CM | POA: Diagnosis present

## 2012-10-06 DIAGNOSIS — J9819 Other pulmonary collapse: Secondary | ICD-10-CM | POA: Diagnosis present

## 2012-10-06 HISTORY — DX: Other ventricular tachycardia: I47.29

## 2012-10-06 HISTORY — DX: Unspecified right bundle-branch block: I45.10

## 2012-10-06 HISTORY — DX: Non-ST elevation (NSTEMI) myocardial infarction: I21.4

## 2012-10-06 HISTORY — DX: Chronic combined systolic (congestive) and diastolic (congestive) heart failure: I50.42

## 2012-10-06 HISTORY — DX: Pulmonary hypertension, unspecified: I27.20

## 2012-10-06 HISTORY — DX: Obstructive sleep apnea (adult) (pediatric): G47.33

## 2012-10-06 HISTORY — DX: Unspecified cirrhosis of liver: K74.60

## 2012-10-06 HISTORY — DX: Ventricular tachycardia: I47.2

## 2012-10-06 HISTORY — DX: Morbid (severe) obesity with alveolar hypoventilation: E66.2

## 2012-10-06 HISTORY — DX: Cardiac arrest, cause unspecified: I46.9

## 2012-10-06 HISTORY — DX: Other pulmonary collapse: J98.19

## 2012-10-06 LAB — URINE MICROSCOPIC-ADD ON

## 2012-10-06 LAB — COMPREHENSIVE METABOLIC PANEL
ALT: 8 U/L (ref 0–35)
Albumin: 3 g/dL — ABNORMAL LOW (ref 3.5–5.2)
Alkaline Phosphatase: 69 U/L (ref 39–117)
BUN: 45 mg/dL — ABNORMAL HIGH (ref 6–23)
Potassium: 3.6 mEq/L (ref 3.5–5.1)
Sodium: 142 mEq/L (ref 135–145)
Total Protein: 6.1 g/dL (ref 6.0–8.3)

## 2012-10-06 LAB — URINALYSIS, ROUTINE W REFLEX MICROSCOPIC
Bilirubin Urine: NEGATIVE
Glucose, UA: NEGATIVE mg/dL
Ketones, ur: NEGATIVE mg/dL
Leukocytes, UA: NEGATIVE
Nitrite: NEGATIVE
Specific Gravity, Urine: 1.012 (ref 1.005–1.030)
pH: 6 (ref 5.0–8.0)

## 2012-10-06 LAB — CBC
HCT: 27.8 % — ABNORMAL LOW (ref 36.0–46.0)
Hemoglobin: 8.8 g/dL — ABNORMAL LOW (ref 12.0–15.0)
MCH: 25.9 pg — ABNORMAL LOW (ref 26.0–34.0)
MCHC: 30.7 g/dL (ref 30.0–36.0)
MCHC: 31.7 g/dL (ref 30.0–36.0)
MCV: 81.8 fL (ref 78.0–100.0)
RDW: 17.5 % — ABNORMAL HIGH (ref 11.5–15.5)
RDW: 17.5 % — ABNORMAL HIGH (ref 11.5–15.5)

## 2012-10-06 LAB — TROPONIN I: Troponin I: <0.3 ng/mL (ref <0.30)

## 2012-10-06 LAB — PRO B NATRIURETIC PEPTIDE: Pro B Natriuretic peptide (BNP): 2416 pg/mL — ABNORMAL HIGH (ref 0–125)

## 2012-10-06 LAB — MRSA PCR SCREENING: MRSA by PCR: NEGATIVE

## 2012-10-06 LAB — HEMOGLOBIN A1C
Hgb A1c MFr Bld: 4.3 % (ref <5.7)
Mean Plasma Glucose: 77 mg/dL (ref <117)

## 2012-10-06 LAB — GLUCOSE, CAPILLARY: Glucose-Capillary: 107 mg/dL — ABNORMAL HIGH (ref 70–99)

## 2012-10-06 LAB — CREATININE, SERUM: GFR calc non Af Amer: 16 mL/min — ABNORMAL LOW (ref >90)

## 2012-10-06 MED ORDER — BIOTENE DRY MOUTH MT LIQD
15.0000 mL | Freq: Every day | OROMUCOSAL | Status: DC
  Administered 2012-10-06 – 2012-10-20 (×14): 15 mL via OROMUCOSAL
  Filled 2012-10-06 (×2): qty 15

## 2012-10-06 MED ORDER — ACETAMINOPHEN 650 MG RE SUPP: 650.0000 mg | Freq: Four times a day (QID) | RECTAL | Status: DC | PRN

## 2012-10-06 MED ORDER — CARVEDILOL 6.25 MG PO TABS
6.2500 mg | ORAL_TABLET | Freq: Two times a day (BID) | ORAL | Status: DC
  Administered 2012-10-06 – 2012-10-09 (×7): 6.25 mg via ORAL
  Filled 2012-10-06 (×11): qty 1

## 2012-10-06 MED ORDER — CLONIDINE HCL 0.1 MG PO TABS
0.1000 mg | ORAL_TABLET | Freq: Two times a day (BID) | ORAL | Status: DC
  Administered 2012-10-06 – 2012-10-20 (×29): 0.1 mg via ORAL
  Filled 2012-10-06 (×30): qty 1

## 2012-10-06 MED ORDER — AMLODIPINE BESYLATE 10 MG PO TABS
10.0000 mg | ORAL_TABLET | Freq: Every day | ORAL | Status: DC
  Administered 2012-10-06 – 2012-10-20 (×15): 10 mg via ORAL
  Filled 2012-10-06 (×15): qty 1

## 2012-10-06 MED ORDER — ARFORMOTEROL TARTRATE 15 MCG/2ML IN NEBU
15.0000 ug | INHALATION_SOLUTION | Freq: Two times a day (BID) | RESPIRATORY_TRACT | Status: DC
  Administered 2012-10-06 – 2012-10-19 (×26): 15 ug via RESPIRATORY_TRACT
  Filled 2012-10-06 (×32): qty 2

## 2012-10-06 MED ORDER — MORPHINE SULFATE 2 MG/ML IJ SOLN: 2.0000 mg | INTRAMUSCULAR | Status: DC | PRN

## 2012-10-06 MED ORDER — ONDANSETRON HCL 4 MG PO TABS: 4.0000 mg | ORAL_TABLET | Freq: Four times a day (QID) | ORAL | Status: DC | PRN

## 2012-10-06 MED ORDER — SODIUM CHLORIDE 0.9 % IV SOLN: 250.0000 mL | INTRAVENOUS | Status: DC | PRN

## 2012-10-06 MED ORDER — NITROGLYCERIN IN D5W 200-5 MCG/ML-% IV SOLN
5.0000 ug/min | INTRAVENOUS | Status: DC
  Administered 2012-10-06: 10 ug/min via INTRAVENOUS
  Filled 2012-10-06: qty 250

## 2012-10-06 MED ORDER — DM-GUAIFENESIN ER 30-600 MG PO TB12
1.0000 | ORAL_TABLET | Freq: Two times a day (BID) | ORAL | Status: DC
  Administered 2012-10-06 – 2012-10-20 (×29): 1 via ORAL
  Filled 2012-10-06 (×30): qty 1

## 2012-10-06 MED ORDER — FUROSEMIDE 10 MG/ML IJ SOLN
40.0000 mg | Freq: Two times a day (BID) | INTRAMUSCULAR | Status: DC
  Administered 2012-10-06 – 2012-10-07 (×2): 40 mg via INTRAVENOUS
  Filled 2012-10-06 (×4): qty 4

## 2012-10-06 MED ORDER — ASPIRIN 81 MG PO CHEW
81.0000 mg | CHEWABLE_TABLET | Freq: Every day | ORAL | Status: DC
  Administered 2012-10-06 – 2012-10-20 (×15): 81 mg via ORAL
  Filled 2012-10-06 (×14): qty 1

## 2012-10-06 MED ORDER — ACETAMINOPHEN 325 MG PO TABS: 650.0000 mg | ORAL_TABLET | Freq: Four times a day (QID) | ORAL | Status: DC | PRN

## 2012-10-06 MED ORDER — SERTRALINE HCL 100 MG PO TABS
100.0000 mg | ORAL_TABLET | Freq: Every day | ORAL | Status: DC
  Administered 2012-10-06 – 2012-10-19 (×14): 100 mg via ORAL
  Filled 2012-10-06 (×16): qty 1

## 2012-10-06 MED ORDER — SODIUM CHLORIDE 0.9 % IJ SOLN
3.0000 mL | Freq: Two times a day (BID) | INTRAMUSCULAR | Status: DC
  Administered 2012-10-06 – 2012-10-11 (×9): 3 mL via INTRAVENOUS

## 2012-10-06 MED ORDER — LORAZEPAM 0.5 MG PO TABS
0.5000 mg | ORAL_TABLET | Freq: Four times a day (QID) | ORAL | Status: DC | PRN
  Administered 2012-10-11: 0.5 mg via ORAL
  Filled 2012-10-06: qty 1

## 2012-10-06 MED ORDER — ONDANSETRON HCL 4 MG/2ML IJ SOLN: 4.0000 mg | Freq: Four times a day (QID) | INTRAMUSCULAR | Status: DC | PRN

## 2012-10-06 MED ORDER — ATORVASTATIN CALCIUM 40 MG PO TABS
40.0000 mg | ORAL_TABLET | Freq: Every day | ORAL | Status: DC
  Administered 2012-10-06 – 2012-10-20 (×13): 40 mg via ORAL
  Filled 2012-10-06 (×15): qty 1

## 2012-10-06 MED ORDER — BUSPIRONE HCL 5 MG PO TABS
5.0000 mg | ORAL_TABLET | Freq: Two times a day (BID) | ORAL | Status: DC
  Administered 2012-10-06 – 2012-10-20 (×28): 5 mg via ORAL
  Filled 2012-10-06 (×30): qty 1

## 2012-10-06 MED ORDER — PANTOPRAZOLE SODIUM 40 MG PO TBEC
40.0000 mg | DELAYED_RELEASE_TABLET | Freq: Every day | ORAL | Status: DC
  Administered 2012-10-06 – 2012-10-20 (×15): 40 mg via ORAL
  Filled 2012-10-06 (×14): qty 1

## 2012-10-06 MED ORDER — ALBUTEROL SULFATE (5 MG/ML) 0.5% IN NEBU: 2.5000 mg | INHALATION_SOLUTION | RESPIRATORY_TRACT | Status: DC | PRN

## 2012-10-06 MED ORDER — SODIUM CHLORIDE 0.9 % IJ SOLN
3.0000 mL | INTRAMUSCULAR | Status: DC | PRN
  Administered 2012-10-12: 3 mL via INTRAVENOUS

## 2012-10-06 MED ORDER — BIOTENE MOISTURIZING MOUTH MT SOLN: 1.0000 | Freq: Every day | OROMUCOSAL | Status: DC

## 2012-10-06 MED ORDER — METOCLOPRAMIDE HCL 5 MG PO TABS
5.0000 mg | ORAL_TABLET | Freq: Four times a day (QID) | ORAL | Status: DC
  Administered 2012-10-06 – 2012-10-20 (×58): 5 mg via ORAL
  Filled 2012-10-06 (×60): qty 1

## 2012-10-06 MED ORDER — HYDRALAZINE HCL 50 MG PO TABS
100.0000 mg | ORAL_TABLET | Freq: Three times a day (TID) | ORAL | Status: DC
  Administered 2012-10-06 – 2012-10-20 (×44): 100 mg via ORAL
  Filled 2012-10-06 (×46): qty 2

## 2012-10-06 MED ORDER — CLOPIDOGREL BISULFATE 75 MG PO TABS
75.0000 mg | ORAL_TABLET | Freq: Every day | ORAL | Status: DC
  Administered 2012-10-07 – 2012-10-20 (×14): 75 mg via ORAL
  Filled 2012-10-06 (×16): qty 1

## 2012-10-06 MED ORDER — ISOSORBIDE MONONITRATE ER 30 MG PO TB24
30.0000 mg | ORAL_TABLET | Freq: Every day | ORAL | Status: DC
  Administered 2012-10-06 – 2012-10-09 (×4): 30 mg via ORAL
  Filled 2012-10-06 (×5): qty 1

## 2012-10-06 MED ORDER — IPRATROPIUM BROMIDE 0.02 % IN SOLN
0.5000 mg | Freq: Four times a day (QID) | RESPIRATORY_TRACT | Status: DC
  Administered 2012-10-06 – 2012-10-20 (×55): 0.5 mg via RESPIRATORY_TRACT
  Filled 2012-10-06 (×5): qty 2.5
  Filled 2012-10-06: qty 7.5
  Filled 2012-10-06: qty 2.5
  Filled 2012-10-06: qty 7.5
  Filled 2012-10-06 (×2): qty 2.5
  Filled 2012-10-06: qty 7.5
  Filled 2012-10-06 (×13): qty 2.5
  Filled 2012-10-06 (×3): qty 7.5
  Filled 2012-10-06 (×10): qty 2.5
  Filled 2012-10-06: qty 7.5
  Filled 2012-10-06 (×13): qty 2.5
  Filled 2012-10-06: qty 7.5
  Filled 2012-10-06 (×5): qty 2.5

## 2012-10-06 MED ORDER — FUROSEMIDE 10 MG/ML IJ SOLN
40.0000 mg | Freq: Once | INTRAMUSCULAR | Status: AC
  Administered 2012-10-06: 40 mg via INTRAVENOUS
  Filled 2012-10-06: qty 4

## 2012-10-06 MED ORDER — INSULIN ASPART 100 UNIT/ML ~~LOC~~ SOLN
0.0000 [IU] | Freq: Three times a day (TID) | SUBCUTANEOUS | Status: DC
  Administered 2012-10-07: 2 [IU] via SUBCUTANEOUS
  Administered 2012-10-08 – 2012-10-09 (×2): 1 [IU] via SUBCUTANEOUS
  Administered 2012-10-09: 5 [IU] via SUBCUTANEOUS
  Administered 2012-10-10 (×2): 2 [IU] via SUBCUTANEOUS
  Administered 2012-10-12 – 2012-10-18 (×4): 1 [IU] via SUBCUTANEOUS

## 2012-10-06 MED ORDER — FERROUS SULFATE 325 (65 FE) MG PO TABS
325.0000 mg | ORAL_TABLET | Freq: Two times a day (BID) | ORAL | Status: DC
  Administered 2012-10-06 – 2012-10-20 (×29): 325 mg via ORAL
  Filled 2012-10-06 (×32): qty 1

## 2012-10-06 MED ORDER — ALBUTEROL SULFATE (5 MG/ML) 0.5% IN NEBU
2.5000 mg | INHALATION_SOLUTION | Freq: Once | RESPIRATORY_TRACT | Status: AC
Start: 2012-10-06 — End: 2012-10-06
  Administered 2012-10-06: 2.5 mg via RESPIRATORY_TRACT
  Filled 2012-10-06: qty 0.5

## 2012-10-06 MED ORDER — VITAMIN C 500 MG PO TABS
500.0000 mg | ORAL_TABLET | Freq: Every day | ORAL | Status: DC
  Administered 2012-10-06 – 2012-10-20 (×15): 500 mg via ORAL
  Filled 2012-10-06 (×15): qty 1

## 2012-10-06 MED ORDER — OXYCODONE HCL 5 MG PO TABS: 5.0000 mg | ORAL_TABLET | ORAL | Status: DC | PRN

## 2012-10-06 MED ORDER — SODIUM CHLORIDE 0.9 % IJ SOLN
3.0000 mL | INTRAMUSCULAR | Status: DC | PRN
  Administered 2012-10-09: 3 mL via INTRAVENOUS

## 2012-10-06 MED ORDER — BUDESONIDE 0.25 MG/2ML IN SUSP
0.2500 mg | Freq: Two times a day (BID) | RESPIRATORY_TRACT | Status: DC
  Administered 2012-10-06 – 2012-10-20 (×28): 0.25 mg via RESPIRATORY_TRACT
  Filled 2012-10-06 (×31): qty 2

## 2012-10-06 MED ORDER — ALBUTEROL SULFATE (5 MG/ML) 0.5% IN NEBU
2.5000 mg | INHALATION_SOLUTION | Freq: Four times a day (QID) | RESPIRATORY_TRACT | Status: DC
  Administered 2012-10-06 – 2012-10-20 (×54): 2.5 mg via RESPIRATORY_TRACT
  Filled 2012-10-06 (×57): qty 0.5

## 2012-10-06 MED ORDER — HEPARIN SODIUM (PORCINE) 5000 UNIT/ML IJ SOLN
5000.0000 [IU] | Freq: Three times a day (TID) | INTRAMUSCULAR | Status: DC
  Administered 2012-10-06 – 2012-10-20 (×42): 5000 [IU] via SUBCUTANEOUS
  Filled 2012-10-06 (×46): qty 1

## 2012-10-06 NOTE — Progress Notes (Signed)
Nutrition Brief Note  Patient identified on the Malnutrition Screening Tool (MST) Report  Pt and daughter report no recent weight loss. Pt's weight does vary based on fluid status. Pt reports good appetite.  Reinforced low sodium diet guidelines.   Wt Readings from Last 15 Encounters:  10/06/12 179 lb (81.194 kg)  09/04/12 169 lb 12.1 oz (77 kg)  09/04/12 169 lb 12.1 oz (77 kg)  07/17/12 181 lb 7 oz (82.3 kg)  06/08/12 165 lb 12.6 oz (75.2 kg)  05/19/12 174 lb 13.2 oz (79.3 kg)  04/30/12 171 lb 8.3 oz (77.8 kg)  02/19/12 197 lb 8.5 oz (89.6 kg)  02/19/12 197 lb 8.5 oz (89.6 kg)  01/31/12 176 lb 6.4 oz (80.015 kg)  01/01/12 164 lb 3.9 oz (74.5 kg)  11/25/11 188 lb 6.4 oz (85.458 kg)  10/07/11 219 lb 9.6 oz (99.61 kg)  09/16/11 246 lb 6 oz (111.755 kg)  09/16/11 246 lb 6 oz (111.755 kg)    Body mass index is 30.71 kg/(m^2). Patient meets criteria for Obesity Class I based on current BMI.   Current diet order is Heart Healthy, patient is consuming approximately 100% of meals at this time. Labs and medications reviewed.   No nutrition interventions warranted at this time. If nutrition issues arise, please consult RD.   Kendell Bane RD, LDN, CNSC [phone] Pager [phone] After Hours Pager

## 2012-10-06 NOTE — Care Management Note (Signed)
    Page 1 of 1   10/06/2012     3:27:49 PM   CARE MANAGEMENT NOTE 10/06/2012  Patient:  [name], [name]   Account Number:  [id number]  Date Initiated:  10/06/2012  Documentation initiated by:  Alvira Philips Assessment:   72 yr-old female adm with dx of resp failure; lives with dtr, has O2 through [employer], [facility] active with RN, PT, OT, SW, and aide     DC Planning Services  CM consult      Per UR Regulation:  Reviewed for med. necessity/level of care/duration of stay  Comments:  PCP:  Dr Dorothyann Peng

## 2012-10-06 NOTE — Progress Notes (Signed)
Utilization Review Completed.  

## 2012-10-06 NOTE — ED Provider Notes (Signed)
CSN: [id number]     Arrival date & time 10/06/12  4098 History     First MD Initiated Contact with Patient 10/06/12 0710     Chief Complaint  Patient presents with  . Shortness of Breath   (Consider location/radiation/quality/duration/timing/severity/associated sxs/prior Treatment) HPI Comments: 72 year old female presents emergency department with chief complaint of shortness of breath. Symptoms began approximately 2-3 days ago. Patient has a history of long-standing COPD, CHF, and has a trach in place. Janina Mayo is in place for a couple years and there is no complication with the trach per patient. She is on a at home oxygen 4 L. She feels that she has not been monitoring her fluid intake and suspect CHF exacerbation. She denies fever, chills, chest pain, nausea vomiting diarrhea belly pain, recent illness, recent travel. She endorses bilateral lower extremity edema that is worsened with past 2 days  Patient is a 72 y.o. female presenting with shortness of breath. The history is provided by the patient.  Shortness of Breath Severity:  Moderate Onset quality:  Gradual Duration:  3 days Timing:  Constant Progression:  Worsening Chronicity:  Recurrent Context: activity   Context: not animal exposure, not emotional upset, not fumes, not known allergens, not occupational exposure, not pollens and not smoke exposure   Relieved by:  Nothing Worsened by:  Nothing tried Ineffective treatments:  Diuretics and inhaler Associated symptoms: no abdominal pain, no chest pain, no claudication, no cough, no diaphoresis, no fever, no headaches and no swollen glands     Past Medical History  Diagnosis Date  . COPD (chronic obstructive pulmonary disease)     on home o2  . Chronic renal insufficiency   . CVA (cerebral infarction)     hx  . Hypertensive heart disease with congestive heart failure   . Chronic cor pulmonale   . Cardiomyopathy of undetermined type 09/07/2011  . Chronic kidney disease  (CKD), stage IV (severe)   . Hyperlipdemia   . Obesity (BMI 30-39.9)   . Sleep apnea   . History of gout 09/07/2011  . Heart failure 7/13  . Shortness of breath   . Anemia   . Vascular disease   . Asthma   . Anginal pain   . Hypertension   . Type II or unspecified type diabetes mellitus without mention of complication, not stated as uncontrolled   . Stroke   . GERD (gastroesophageal reflux disease)   . Arthritis   . Bundle branch block   . Thrombocytopenia   . Acute and chronic respiratory failure   . Obesity, unspecified 08/28/2012   Past Surgical History  Procedure Laterality Date  . Bilateral oophorectomy    . Cardiac catheterization      right heart cath  . Laparoscopic gastrostomy  12/21/2011    Procedure: LAPAROSCOPIC GASTROSTOMY;  Surgeon: Axel Filler, MD;  Location: [facility] OR;  Service: General;  Laterality: N/A;  laparoscopic gastrostomy possible open feeding tube  . Abdominal hysterectomy    . Tracheostomy  11/2011  . Cataracts    . Tracheostomy tube placement  02/18/2012    Procedure: TRACHEOSTOMY;  Surgeon: Serena Colonel, MD;  Location: [facility] OR;  Service: ENT;  Laterality: N/A;  . Tracheostomy revision N/A 09/01/2012    Procedure: TRACHEOSTOMY REVISION;  Surgeon: Serena Colonel, MD;  Location: [facility] OR;  Service: ENT;  Laterality: N/A;   Family History  Problem Relation Age of Onset  . Heart attack Father   . Stroke Mother     CVA  .  Coronary artery disease Daughter    History  Substance Use Topics  . Smoking status: Former Smoker -- 1.00 packs/day for 55 years    Types: Cigarettes    Quit date: 08/28/2011  . Smokeless tobacco: Never Used  . Alcohol Use: No   OB History   Grav Para Term Preterm Abortions TAB SAB Ect Mult Living                 Review of Systems  Constitutional: Positive for activity change. Negative for fever, diaphoresis and appetite change.  HENT: Negative.   Respiratory: Positive for shortness of breath. Negative for cough.    Cardiovascular: Negative for chest pain and claudication.  Gastrointestinal: Negative for abdominal pain.  Endocrine: Negative.   Genitourinary: Negative.   Musculoskeletal:       Bilateral lower extremity edema  Neurological: Negative for dizziness, facial asymmetry and headaches.    Allergies  Other and Sulfonamide derivatives  Home Medications   Current Outpatient Rx  Name  Route  Sig  Dispense  Refill  . albuterol (PROVENTIL) (5 MG/ML) 0.5% nebulizer solution   Nebulization   Take 0.5 mLs (2.5 mg total) by nebulization 4 (four) times daily.   20 mL   12   . amLODipine (NORVASC) 10 MG tablet   Oral   Take 10 mg by mouth daily.         Marland Kitchen arformoterol (BROVANA) 15 MCG/2ML NEBU   Nebulization   Take 2 mLs (15 mcg total) by nebulization 2 (two) times daily.   120 mL   1   . Ascorbic Acid (VITAMIN C PO)   Oral   Take 1 tablet by mouth daily.         Marland Kitchen aspirin 81 MG chewable tablet   Oral   Chew 1 tablet (81 mg total) by mouth daily.         Marland Kitchen atorvastatin (LIPITOR) 40 MG tablet   Oral   Take 1 tablet (40 mg total) by mouth daily.   30 tablet   1   . budesonide (PULMICORT) 0.25 MG/2ML nebulizer solution   Nebulization   Take 2 mLs (0.25 mg total) by nebulization 2 (two) times daily.   60 mL   12   . busPIRone (BUSPAR) 5 MG tablet   Oral   Take 5 mg by mouth 2 (two) times daily.         . carvedilol (COREG) 6.25 MG tablet   Oral   Take 1 tablet (6.25 mg total) by mouth 2 (two) times daily with a meal.   60 tablet   1   . cloNIDine (CATAPRES) 0.2 MG tablet   Oral   Take 0.5 tablets (0.1 mg total) by mouth 2 (two) times daily.   60 tablet   11   . clopidogrel (PLAVIX) 75 MG tablet   Oral   Take 1 tablet (75 mg total) by mouth daily with breakfast.   30 tablet   11   . dextromethorphan-guaiFENesin (MUCINEX DM) 30-600 MG per 12 hr tablet   Oral   Take 1 tablet by mouth 2 (two) times daily.   60 tablet   0   . ferrous sulfate 325 (65  FE) MG EC tablet   Oral   Take 1 tablet (325 mg total) by mouth 2 (two) times daily.   60 tablet   3   . hydrALAZINE (APRESOLINE) 100 MG tablet   Oral   Take 100 mg by mouth 3 (three)  times daily.         Marland Kitchen ipratropium (ATROVENT) 0.02 % nebulizer solution   Nebulization   Take 2.5 mLs (0.5 mg total) by nebulization 4 (four) times daily.   75 mL   12   . isosorbide mononitrate (IMDUR) 30 MG 24 hr tablet   Oral   Take 1 tablet (30 mg total) by mouth daily.   30 tablet   1   . LORazepam (ATIVAN) 0.5 MG tablet   Oral   Take 1 tablet (0.5 mg total) by mouth every 6 (six) hours as needed for anxiety.   30 tablet   0   . metoCLOPramide (REGLAN) 5 MG tablet   Oral   Take 5 mg by mouth 4 (four) times daily.         Marland Kitchen oxycodone (OXY-IR) 5 MG capsule   Oral   Take 5 mg by mouth every 6 (six) hours as needed (for moderate pain).         . pantoprazole (PROTONIX) 20 MG tablet   Oral   Take 40 mg by mouth daily.         . sertraline (ZOLOFT) 100 MG tablet   Oral   Take 100 mg by mouth at bedtime.         . torsemide (DEMADEX) 20 MG tablet   Oral   Take 20 mg by mouth daily.         . Artificial Saliva (BIOTENE MOISTURIZING MOUTH) SOLN   Mouth/Throat   Use as directed 1 spray in the mouth or throat daily.          BP 170/79  Pulse 64  Temp(Src) 98.8 F (37.1 C) (Oral)  Resp 24  Wt 179 lb (81.194 kg)  BMI 30.71 kg/m2  SpO2 99% Physical Exam  Constitutional: She is oriented to person, place, and time. She is not intubated.  72 year old female in no acute distress however tachypnea at 25-30 per minute, trach is in place, patient able to speak in 4-5 word answers the significant distress  HENT:  Head: Normocephalic.  Neck: No tracheal deviation present. No thyromegaly present.  Tracheostomy in place  Cardiovascular: Exam reveals friction rub.   Distant heart sounds   Pulmonary/Chest: No accessory muscle usage. Tachypnea noted. No apnea and not  bradypneic. She is not intubated. She is in respiratory distress. She has decreased breath sounds. She has no wheezes. She exhibits no tenderness.  Abdominal: Soft. Normal appearance. She exhibits no distension. There is no tenderness. There is no rebound and no guarding.  Musculoskeletal: She exhibits edema.  Bilateral extremity edema to knees, pitting, symmetric  Neurological: She is alert and oriented to person, place, and time. She has normal reflexes.    ED Course   Procedures (including critical care time)  CRITICAL CARE Performed by: Redgie Grayer, Olan Kurek   Total critical care time: 60 min  Critical care time was exclusive of separately billable procedures and treating other patients.  Critical care was necessary to treat or prevent imminent or life-threatening deterioration.  Critical care was time spent personally by me on the following activities: development of treatment plan with patient and/or surrogate as well as nursing, discussions with consultants, evaluation of patient's response to treatment, examination of patient, obtaining history from patient or surrogate, ordering and performing treatments and interventions, ordering and review of laboratory studies, ordering and review of radiographic studies, pulse oximetry and re-evaluation of patient's condition. Results for orders placed during the hospital encounter of 10/06/12  URINALYSIS, ROUTINE W REFLEX MICROSCOPIC      Result Value Range   Color, Urine YELLOW  YELLOW   APPearance CLEAR  CLEAR   Specific Gravity, Urine 1.012  1.005 - 1.030   pH 6.0  5.0 - 8.0   Glucose, UA NEGATIVE  NEGATIVE mg/dL   Hgb urine dipstick NEGATIVE  NEGATIVE   Bilirubin Urine NEGATIVE  NEGATIVE   Ketones, ur NEGATIVE  NEGATIVE mg/dL   Protein, ur >578 (*) NEGATIVE mg/dL   Urobilinogen, UA 0.2  0.0 - 1.0 mg/dL   Nitrite NEGATIVE  NEGATIVE   Leukocytes, UA NEGATIVE  NEGATIVE  CBC      Result Value Range   WBC 6.8  4.0 - 10.5 K/uL   RBC 3.60  (*) 3.87 - 5.11 MIL/uL   Hemoglobin 9.2 (*) 12.0 - 15.0 g/dL   HCT 46.9 (*) 62.9 - 52.8 %   MCV 83.3  78.0 - 100.0 fL   MCH 25.6 (*) 26.0 - 34.0 pg   MCHC 30.7  30.0 - 36.0 g/dL   RDW 41.3 (*) 24.4 - 01.0 %   Platelets 209  150 - 400 K/uL  COMPREHENSIVE METABOLIC PANEL      Result Value Range   Sodium 142  135 - 145 mEq/L   Potassium 3.6  3.5 - 5.1 mEq/L   Chloride 106  96 - 112 mEq/L   CO2 21  19 - 32 mEq/L   Glucose, Bld 96  70 - 99 mg/dL   BUN 45 (*) 6 - 23 mg/dL   Creatinine, Ser 2.72 (*) 0.50 - 1.10 mg/dL   Calcium 9.5  8.4 - 53.6 mg/dL   Total Protein 6.1  6.0 - 8.3 g/dL   Albumin 3.0 (*) 3.5 - 5.2 g/dL   AST 15  0 - 37 U/L   ALT 8  0 - 35 U/L   Alkaline Phosphatase 69  39 - 117 U/L   Total Bilirubin 0.3  0.3 - 1.2 mg/dL   GFR calc non Af Amer 16 (*) >90 mL/min   GFR calc Af Amer 19 (*) >90 mL/min  PRO B NATRIURETIC PEPTIDE      Result Value Range   Pro B Natriuretic peptide (BNP) 2416.0 (*) 0 - 125 pg/mL  TROPONIN I      Result Value Range   Troponin I <0.30  <0.30 ng/mL  URINE MICROSCOPIC-ADD ON      Result Value Range   WBC, UA 0-2  <3 WBC/hpf   RBC / HPF 0-2  <3 RBC/hpf     Labs Reviewed  URINALYSIS, ROUTINE W REFLEX MICROSCOPIC - Abnormal; Notable for the following:    Protein, ur >300 (*)    All other components within normal limits  CBC - Abnormal; Notable for the following:    RBC 3.60 (*)    Hemoglobin 9.2 (*)    HCT 30.0 (*)    MCH 25.6 (*)    RDW 17.5 (*)    All other components within normal limits  COMPREHENSIVE METABOLIC PANEL - Abnormal; Notable for the following:    BUN 45 (*)    Creatinine, Ser 2.76 (*)    Albumin 3.0 (*)    GFR calc non Af Amer 16 (*)    GFR calc Af Amer 19 (*)    All other components within normal limits  PRO B NATRIURETIC PEPTIDE - Abnormal; Notable for the following:    Pro B Natriuretic peptide (BNP) 2416.0 (*)    All other  components within normal limits  TROPONIN I  URINE MICROSCOPIC-ADD ON   Dg Chest 1  View  10/06/2012   *RADIOLOGY REPORT*  Clinical Data: Shortness of breath.  Fluid overload.  Current history of chronic renal insufficiency, COPD, sleep apnea, asthma, hypertension, diabetes.  Prior history of CHF.  CHEST - 1 VIEW  Comparison: Portable chest x-ray earlier same date 0732 hours, 09/13/2012, 09/03/2012, 03/17/2012.  Findings: Lateral view of the chest read in conjunction with the AP portable chest x-ray earlier today demonstrates suboptimal inspiration with crowded bronchovascular markings in the lower lobes.  Cardiac silhouette moderately to markedly enlarged. Pulmonary venous hypertension without overt edema.  Very small bilateral pleural effusions.  Degenerative changes involving the thoracic spine.  IMPRESSION: Cardiomegaly and pulmonary venous hypertension without overt edema. Small bilateral pleural effusions.   Original Report Authenticated By: Hulan Saas, M.D.   Dg Chest Portable 1 View  10/06/2012   *RADIOLOGY REPORT*  Clinical Data: Shortness of breath.  PORTABLE CHEST - 1 VIEW  Comparison: 09/13/2012.  Findings: The tracheostomy tube is in good position.  Heart is enlarged but stable.  Stable elevation of the left hemidiaphragm. Mild chronic vascular congestion without overt pulmonary edema or pleural effusion.  IMPRESSION:  1.  Stable tracheostomy tube. 2.  Stable cardiac enlargement and mild chronic vascular congestion. 3.  No overt pulmonary edema, focal infiltrates or effusions.   Original Report Authenticated By: Rudie Meyer, M.D.   1. CHF (congestive heart failure), acute on chronic, systolic   2. COPD (chronic obstructive pulmonary disease)      Date: 10/06/2012  Rate: 63  Rhythm: normal sinus rhythm  QRS Axis: left  Intervals: PR prolonged and QT prolonged  ST/T Wave abnormalities: nonspecific ST changes  Conduction Disutrbances:right bundle branch block  Narrative Interpretation: No clear evidence of ischemia  Old EKG Reviewed: changes noted Results for  orders placed during the hospital encounter of 10/06/12  URINALYSIS, ROUTINE W REFLEX MICROSCOPIC      Result Value Range   Color, Urine YELLOW  YELLOW   APPearance CLEAR  CLEAR   Specific Gravity, Urine 1.012  1.005 - 1.030   pH 6.0  5.0 - 8.0   Glucose, UA NEGATIVE  NEGATIVE mg/dL   Hgb urine dipstick NEGATIVE  NEGATIVE   Bilirubin Urine NEGATIVE  NEGATIVE   Ketones, ur NEGATIVE  NEGATIVE mg/dL   Protein, ur >259 (*) NEGATIVE mg/dL   Urobilinogen, UA 0.2  0.0 - 1.0 mg/dL   Nitrite NEGATIVE  NEGATIVE   Leukocytes, UA NEGATIVE  NEGATIVE  CBC      Result Value Range   WBC 6.8  4.0 - 10.5 K/uL   RBC 3.60 (*) 3.87 - 5.11 MIL/uL   Hemoglobin 9.2 (*) 12.0 - 15.0 g/dL   HCT 56.3 (*) 87.5 - 64.3 %   MCV 83.3  78.0 - 100.0 fL   MCH 25.6 (*) 26.0 - 34.0 pg   MCHC 30.7  30.0 - 36.0 g/dL   RDW 32.9 (*) 51.8 - 84.1 %   Platelets 209  150 - 400 K/uL  COMPREHENSIVE METABOLIC PANEL      Result Value Range   Sodium 142  135 - 145 mEq/L   Potassium 3.6  3.5 - 5.1 mEq/L   Chloride 106  96 - 112 mEq/L   CO2 21  19 - 32 mEq/L   Glucose, Bld 96  70 - 99 mg/dL   BUN 45 (*) 6 - 23 mg/dL   Creatinine, Ser 6.60 (*) 0.50 -  1.10 mg/dL   Calcium 9.5  8.4 - 16.1 mg/dL   Total Protein 6.1  6.0 - 8.3 g/dL   Albumin 3.0 (*) 3.5 - 5.2 g/dL   AST 15  0 - 37 U/L   ALT 8  0 - 35 U/L   Alkaline Phosphatase 69  39 - 117 U/L   Total Bilirubin 0.3  0.3 - 1.2 mg/dL   GFR calc non Af Amer 16 (*) >90 mL/min   GFR calc Af Amer 19 (*) >90 mL/min  PRO B NATRIURETIC PEPTIDE      Result Value Range   Pro B Natriuretic peptide (BNP) 2416.0 (*) 0 - 125 pg/mL  TROPONIN I      Result Value Range   Troponin I <0.30  <0.30 ng/mL  URINE MICROSCOPIC-ADD ON      Result Value Range   WBC, UA 0-2  <3 WBC/hpf   RBC / HPF 0-2  <3 RBC/hpf     MDM  72 year old female with long-standing history of CHF and COPD with tracheostomy on the at home O2 at 4 L presents emergency department with shortness of breath and increased  swelling bilaterally. Based on physical exam and history of CHF is likely that she does have a CHF exacerbation. EKG on arrival without clear evidence of ischemia. Plan to obtain stat chest x-ray, blood work, place Foley catheter, give Lasix IV and initiate nitroglycerin drip. Blood pressure on arrival 161/63, patient should tolerate nitroglycerin drip without issue, nursing to titrate to effect. 9:23 AM  patient symptoms have improved. Troponins negative. Chest x-ray without clear evidence of frank pulmonary edema. BNP is 2400. Patient is on nitro drip at very low rate and Lasix has been given. Based on history of COPD Will give albuterol neb as well. Plan to consult internal medicine for admission.  Problem list: CHF, COPD, trach in place, renal insufficiency creatinine 2.7, deconditioning.  Case discussed with Dr. Brien Few and plan for admission to step down unit to team 10. No new interventions her management at this time.   Darlys Gales, MD 10/06/12 [phone]

## 2012-10-06 NOTE — ED Notes (Signed)
Placed a 16 french foley cath into patient clear yellow urine in return

## 2012-10-06 NOTE — ED Notes (Signed)
Meal provided.  No distress noted.  Pt denies pain at the present.

## 2012-10-06 NOTE — ED Notes (Signed)
Attempted Report 

## 2012-10-06 NOTE — ED Notes (Signed)
Per report pt has treatment from [facility].  Pt states that she received a call informing her that she had "retained to much fluid".  Pt is noted be SOB.  Resp symmetrical but labored.  She is able to talk in complete sentences.

## 2012-10-06 NOTE — Progress Notes (Signed)
[facility]  Patient Status: Active (receiving services up to time of hospitalization)  [facility] is providing the following services: RN, PT, OT, MSW and HHA  If patient discharges after hours, please call [phone].   [name] 10/06/2012, 2:29 PM

## 2012-10-06 NOTE — H&P (Signed)
PCP:   Gwynneth Aliment, MD   Chief Complaint:  SOB.   HPI: This is a 72 year old female, with history of Obesity, OSA, Asthma/COPD, chronic respiratory failure, s/p Trach, on home O2 at 4L/Min, Chronic Cor Pulmonale, HTN, s/p CVA, Cardiomyopathy, CHF, EF 45% per 2D Echocardiogram 06/2012, CKD-4, Gout, Dyslipidemia, chronic anemia/Thrombocytopenia, Diet-controlled DM-2, GERD, OA, presenting with progressive shortness of breath over 2-3 days, unassociated with chest pain, fever or chills. She has had a mild cough productive of whitish phlegm, as well as progressive bilateral LE swelling. Denies abdominal pain, vomiting or diarrhea. Daughter brought her to the ED today, because of worsening symptoms. In the ED, she was administered iv Lasix and NTG infusion, with amelioration of symptoms.     Allergies:   Allergies  Allergen Reactions  . Other Itching    "Wool"  . Sulfonamide Derivatives Hives and Rash      Past Medical History  Diagnosis Date  . COPD (chronic obstructive pulmonary disease)     on home o2  . Chronic renal insufficiency   . CVA (cerebral infarction)     hx  . Hypertensive heart disease with congestive heart failure   . Chronic cor pulmonale   . Cardiomyopathy of undetermined type 09/07/2011  . Chronic kidney disease (CKD), stage IV (severe)   . Hyperlipdemia   . Obesity (BMI 30-39.9)   . Sleep apnea   . History of gout 09/07/2011  . Heart failure 7/13  . Shortness of breath   . Anemia   . Vascular disease   . Asthma   . Anginal pain   . Hypertension   . Type II or unspecified type diabetes mellitus without mention of complication, not stated as uncontrolled   . Stroke   . GERD (gastroesophageal reflux disease)   . Arthritis   . Bundle branch block   . Thrombocytopenia   . Acute and chronic respiratory failure   . Obesity, unspecified 08/28/2012    Past Surgical History  Procedure Laterality Date  . Bilateral oophorectomy    . Cardiac catheterization       right heart cath  . Laparoscopic gastrostomy  12/21/2011    Procedure: LAPAROSCOPIC GASTROSTOMY;  Surgeon: Axel Filler, MD;  Location[facility];  Service: General;  Laterality: N/A;  laparoscopic gastrostomy possible open feeding tube  . Abdominal hysterectomy    . Tracheostomy  11/2011  . Cataracts    . Tracheostomy tube placement  02/18/2012    Procedure: TRACHEOSTOMY;  Surgeon: Serena Colonel, MD;  Location: [facility] OR;  Service: ENT;  Laterality: N/A;  . Tracheostomy revision N/A 09/01/2012    Procedure: TRACHEOSTOMY REVISION;  Surgeon: Serena Colonel, MD;  Location: [facility] OR;  Service: ENT;  Laterality: N/A;    Prior to Admission medications   Medication Sig Start Date End Date Taking? Authorizing Provider  albuterol (PROVENTIL) (5 MG/ML) 0.5% nebulizer solution Take 0.5 mLs (2.5 mg total) by nebulization 4 (four) times daily. 09/14/12  Yes Christiane Ha, MD  amLODipine (NORVASC) 10 MG tablet Take 10 mg by mouth daily.   Yes Historical Provider, MD  arformoterol (BROVANA) 15 MCG/2ML NEBU Take 2 mLs (15 mcg total) by nebulization 2 (two) times daily. 07/16/12  Yes Renae Fickle, MD  Ascorbic Acid (VITAMIN C PO) Take 1 tablet by mouth daily.   Yes Historical Provider, MD  aspirin 81 MG chewable tablet Chew 1 tablet (81 mg total) by mouth daily. 02/19/12  Yes Simonne Martinet, NP  atorvastatin (LIPITOR) 40  MG tablet Take 1 tablet (40 mg total) by mouth daily. 07/16/12  Yes Renae Fickle, MD  budesonide (PULMICORT) 0.25 MG/2ML nebulizer solution Take 2 mLs (0.25 mg total) by nebulization 2 (two) times daily. 07/16/12  Yes Renae Fickle, MD  busPIRone (BUSPAR) 5 MG tablet Take 5 mg by mouth 2 (two) times daily.   Yes Historical Provider, MD  carvedilol (COREG) 6.25 MG tablet Take 1 tablet (6.25 mg total) by mouth 2 (two) times daily with a meal. 05/19/12  Yes Estela Isaiah Blakes, MD  cloNIDine (CATAPRES) 0.2 MG tablet Take 0.5 tablets (0.1 mg total) by mouth 2 (two) times daily. 07/16/12  Yes  Renae Fickle, MD  clopidogrel (PLAVIX) 75 MG tablet Take 1 tablet (75 mg total) by mouth daily with breakfast. 07/16/12  Yes Renae Fickle, MD  dextromethorphan-guaiFENesin [facility] DM) 30-600 MG per 12 hr tablet Take 1 tablet by mouth 2 (two) times daily. 07/16/12  Yes Renae Fickle, MD  ferrous sulfate 325 (65 FE) MG EC tablet Take 1 tablet (325 mg total) by mouth 2 (two) times daily. 07/16/12  Yes Renae Fickle, MD  hydrALAZINE (APRESOLINE) 100 MG tablet Take 100 mg by mouth 3 (three) times daily.   Yes Historical Provider, MD  ipratropium (ATROVENT) 0.02 % nebulizer solution Take 2.5 mLs (0.5 mg total) by nebulization 4 (four) times daily. 09/14/12  Yes Christiane Ha, MD  isosorbide mononitrate (IMDUR) 30 MG 24 hr tablet Take 1 tablet (30 mg total) by mouth daily. 05/19/12  Yes Estela Isaiah Blakes, MD  LORazepam (ATIVAN) 0.5 MG tablet Take 1 tablet (0.5 mg total) by mouth every 6 (six) hours as needed for anxiety. 09/14/12  Yes Christiane Ha, MD  metoCLOPramide (REGLAN) 5 MG tablet Take 5 mg by mouth 4 (four) times daily.   Yes Historical Provider, MD  oxycodone (OXY-IR) 5 MG capsule Take 5 mg by mouth every 6 (six) hours as needed (for moderate pain).   Yes Historical Provider, MD  pantoprazole (PROTONIX) 20 MG tablet Take 40 mg by mouth daily.   Yes Historical Provider, MD  sertraline (ZOLOFT) 100 MG tablet Take 100 mg by mouth at bedtime.   Yes Historical Provider, MD  torsemide (DEMADEX) 20 MG tablet Take 20 mg by mouth daily.   Yes Historical Provider, MD  Artificial Saliva (BIOTENE MOISTURIZING MOUTH) SOLN Use as directed 1 spray in the mouth or throat daily.    Historical Provider, MD    Social History: Patient reports that she quit smoking about 13 months ago. Her smoking use included Cigarettes. She has a 55 pack-year smoking history. She has never used smokeless tobacco. She reports that she does not drink alcohol or use illicit drugs.  Family History  Problem  Relation Age of Onset  . Heart attack Father   . Stroke Mother     CVA  . Coronary artery disease Daughter     Review of Systems:  As per HPI and chief complaint. Patent denies fatigue, diminished appetite, weight loss, fever, chills, headache, blurred vision, difficulty in speaking, dysphagia, chest pain, orthopnea, paroxysmal nocturnal dyspnea, nausea, diaphoresis, abdominal pain, vomiting, diarrhea, belching, heartburn, hematemesis, melena, dysuria, nocturia, urinary frequency, hematochezia, lower extremity pain, or redness. the rest of the systems review is negative.  Physical Exam:  General:  Patient does not appear to be in obvious acute distress. Alert, communicative, fully oriented, talking in complete sentences, not short of breath at rest.  HEENT:  Mild clinical pallor, no jaundice, no conjunctival  injection or discharge. NECK:  Supple, JVP not seen, no carotid bruits, no palpable lymphadenopathy, no palpable goiter. Patient has trach collar with Passey-Muir valve.  CHEST:  Few crackles left base. HEART:  Sounds 1 and 2 heard, normal, regular, no murmurs. ABDOMEN:  Moderately obese, soft, non-tender, no palpable organomegaly, no palpable masses, normal bowel sounds. GENITALIA:  Not examined. LOWER EXTREMITIES:  Mid-moderate pitting edema, palpable peripheral pulses. MUSCULOSKELETAL SYSTEM:  Generalized osteoarthritic changes, otherwise, normal. CENTRAL NERVOUS SYSTEM:  No focal neurologic deficit on gross examination.  Labs on Admission:  Results for orders placed during the hospital encounter of 10/06/12 (from the past 48 hour(s))  CBC     Status: Abnormal   Collection Time    10/06/12  7:15 AM      Result Value Range   WBC 6.8  4.0 - 10.5 K/uL   RBC 3.60 (*) 3.87 - 5.11 MIL/uL   Hemoglobin 9.2 (*) 12.0 - 15.0 g/dL   HCT 16.1 (*) 09.6 - 04.5 %   MCV 83.3  78.0 - 100.0 fL   MCH 25.6 (*) 26.0 - 34.0 pg   MCHC 30.7  30.0 - 36.0 g/dL   RDW 40.9 (*) 81.1 - 91.4 %    Platelets 209  150 - 400 K/uL  COMPREHENSIVE METABOLIC PANEL     Status: Abnormal   Collection Time    10/06/12  7:15 AM      Result Value Range   Sodium 142  135 - 145 mEq/L   Potassium 3.6  3.5 - 5.1 mEq/L   Chloride 106  96 - 112 mEq/L   CO2 21  19 - 32 mEq/L   Glucose, Bld 96  70 - 99 mg/dL   BUN 45 (*) 6 - 23 mg/dL   Creatinine, Ser 7.82 (*) 0.50 - 1.10 mg/dL   Calcium 9.5  8.4 - 95.6 mg/dL   Total Protein 6.1  6.0 - 8.3 g/dL   Albumin 3.0 (*) 3.5 - 5.2 g/dL   AST 15  0 - 37 U/L   ALT 8  0 - 35 U/L   Alkaline Phosphatase 69  39 - 117 U/L   Total Bilirubin 0.3  0.3 - 1.2 mg/dL   GFR calc non Af Amer 16 (*) >90 mL/min   GFR calc Af Amer 19 (*) >90 mL/min   Comment: (NOTE)     The eGFR has been calculated using the CKD EPI equation.     This calculation has not been validated in all clinical situations.     eGFR's persistently <90 mL/min signify possible Chronic Kidney     Disease.  PRO B NATRIURETIC PEPTIDE     Status: Abnormal   Collection Time    10/06/12  7:15 AM      Result Value Range   Pro B Natriuretic peptide (BNP) 2416.0 (*) 0 - 125 pg/mL  URINALYSIS, ROUTINE W REFLEX MICROSCOPIC     Status: Abnormal   Collection Time    10/06/12  7:41 AM      Result Value Range   Color, Urine YELLOW  YELLOW   APPearance CLEAR  CLEAR   Specific Gravity, Urine 1.012  1.005 - 1.030   pH 6.0  5.0 - 8.0   Glucose, UA NEGATIVE  NEGATIVE mg/dL   Hgb urine dipstick NEGATIVE  NEGATIVE   Bilirubin Urine NEGATIVE  NEGATIVE   Ketones, ur NEGATIVE  NEGATIVE mg/dL   Protein, ur >213 (*) NEGATIVE mg/dL   Urobilinogen, UA 0.2  0.0 -  1.0 mg/dL   Nitrite NEGATIVE  NEGATIVE   Leukocytes, UA NEGATIVE  NEGATIVE  URINE MICROSCOPIC-ADD ON     Status: None   Collection Time    10/06/12  7:41 AM      Result Value Range   WBC, UA 0-2  <3 WBC/hpf   RBC / HPF 0-2  <3 RBC/hpf  TROPONIN I     Status: None   Collection Time    10/06/12  8:30 AM      Result Value Range   Troponin I <0.30  <0.30  ng/mL   Comment:            Due to the release kinetics of cTnI,     a negative result within the first hours     of the onset of symptoms does not rule out     myocardial infarction with certainty.     If myocardial infarction is still suspected,     repeat the test at appropriate intervals.    Radiological Exams on Admission: Dg Chest 1 View  10/06/2012   *RADIOLOGY REPORT*  Clinical Data: Shortness of breath.  Fluid overload.  Current history of chronic renal insufficiency, COPD, sleep apnea, asthma, hypertension, diabetes.  Prior history of CHF.  CHEST - 1 VIEW  Comparison: Portable chest x-ray earlier same date 0732 hours, 09/13/2012, 09/03/2012, 03/17/2012.  Findings: Lateral view of the chest read in conjunction with the AP portable chest x-ray earlier today demonstrates suboptimal inspiration with crowded bronchovascular markings in the lower lobes.  Cardiac silhouette moderately to markedly enlarged. Pulmonary venous hypertension without overt edema.  Very small bilateral pleural effusions.  Degenerative changes involving the thoracic spine.  IMPRESSION: Cardiomegaly and pulmonary venous hypertension without overt edema. Small bilateral pleural effusions.   Original Report Authenticated By: Hulan Saas, M.D.   Dg Chest Portable 1 View  10/06/2012   *RADIOLOGY REPORT*  Clinical Data: Shortness of breath.  PORTABLE CHEST - 1 VIEW  Comparison: 09/13/2012.  Findings: The tracheostomy tube is in good position.  Heart is enlarged but stable.  Stable elevation of the left hemidiaphragm. Mild chronic vascular congestion without overt pulmonary edema or pleural effusion.  IMPRESSION:  1.  Stable tracheostomy tube. 2.  Stable cardiac enlargement and mild chronic vascular congestion. 3.  No overt pulmonary edema, focal infiltrates or effusions.   Original Report Authenticated By: Rudie Meyer, M.D.    Assessment/Plan Active Problems:   1. Dyspnea/Respiratory failure, acute and chronic:  Patient presented with progressive SOB, associated with increasing bilateral LE edema, in the setting of O2-dependent COPD/OSA and chronic respiratory failure. She had no fever or chills, cough was productive of whitish phlegm, and wcc was normal. CXR revealed cardiomegaly and pulmonary venous hypertension without overt edema.  Small bilateral pleural effusions. Clinically, the etiology is acute decompensation of systolic CHF, superimposed on reduced respiratory reserve.  2. CHF (congestive heart failure): Patient has known hypertensive cardiomyopathy and CHF, with known EF of 45% per 2D Echocardiogram 06/2012. She has had progressive LE edema in addition to SOB, and ProBNP is elevated at 2416. CXR findings are described in #1 above. Patient has already appreciably responded to iv lasix and NTG infusion in the ED. We shall continue iv Lasix, as well as supportive management.  3. COPD (chronic obstructive pulmonary disease): Not in acute exacerbation clinically, at this time. Will manage with bronchodilators.  4. CKD (chronic kidney disease), stage IV: Baseline creatinine is 2.74-3.27, from 08/2012. Creatinine at present is 2.76, ie, at baseline.  Primary nephrologist is Dr Elvis Coil. We shall follow renal indices.  5. DM (diabetes mellitus): This is diet-controlled type-2. Random blood glucose is normal at 96. We shall manage with diet/SSI.  6. GERD (gastroesophageal reflux disease): Asymptomatic.  7. Gout: Asymptomatic.  8. HTN: BP appears reasonable at this time. Patient was on multiple antihypertensives pre-admisson, which we have continued.   Further management will depend on clinical course.   Comment: Patient is FULL CODE.   Time Spent on Admission: 1 Hour.   Arth Nicastro,CHRISTOPHER 10/06/2012, 10:17 AM

## 2012-10-07 ENCOUNTER — Inpatient Hospital Stay (HOSPITAL_COMMUNITY): Payer: Medicare Other

## 2012-10-07 DIAGNOSIS — D649 Anemia, unspecified: Secondary | ICD-10-CM

## 2012-10-07 LAB — BLOOD GAS, ARTERIAL
Bicarbonate: 27.9 mEq/L — ABNORMAL HIGH (ref 20.0–24.0)
Patient temperature: 98.6
TCO2: 29.1 mmol/L (ref 0–100)
pH, Arterial: 7.461 — ABNORMAL HIGH (ref 7.350–7.450)

## 2012-10-07 LAB — COMPREHENSIVE METABOLIC PANEL
ALT: 6 U/L (ref 0–35)
AST: 11 U/L (ref 0–37)
Albumin: 2.8 g/dL — ABNORMAL LOW (ref 3.5–5.2)
Chloride: 104 mEq/L (ref 96–112)
Creatinine, Ser: 2.81 mg/dL — ABNORMAL HIGH (ref 0.50–1.10)
Sodium: 141 mEq/L (ref 135–145)
Total Bilirubin: 0.2 mg/dL — ABNORMAL LOW (ref 0.3–1.2)

## 2012-10-07 LAB — PROTEIN / CREATININE RATIO, URINE
Creatinine, Urine: 68.48 mg/dL
Total Protein, Urine: 252.2 mg/dL

## 2012-10-07 LAB — CBC
MCV: 81.6 fL (ref 78.0–100.0)
Platelets: 209 10*3/uL (ref 150–400)
RDW: 17.4 % — ABNORMAL HIGH (ref 11.5–15.5)
WBC: 8.2 10*3/uL (ref 4.0–10.5)

## 2012-10-07 LAB — RENAL FUNCTION PANEL
CO2: 28 mEq/L (ref 19–32)
Calcium: 9 mg/dL (ref 8.4–10.5)
Chloride: 103 mEq/L (ref 96–112)
GFR calc Af Amer: 19 mL/min — ABNORMAL LOW (ref >90)
GFR calc non Af Amer: 16 mL/min — ABNORMAL LOW (ref >90)
Potassium: 3.7 mEq/L (ref 3.5–5.1)
Sodium: 139 mEq/L (ref 135–145)

## 2012-10-07 LAB — GLUCOSE, CAPILLARY: Glucose-Capillary: 124 mg/dL — ABNORMAL HIGH (ref 70–99)

## 2012-10-07 LAB — PRO B NATRIURETIC PEPTIDE: Pro B Natriuretic peptide (BNP): 3536 pg/mL — ABNORMAL HIGH (ref 0–125)

## 2012-10-07 MED ORDER — TECHNETIUM TC 99M DIETHYLENETRIAME-PENTAACETIC ACID: 40.0000 | Freq: Once | INTRAVENOUS | Status: AC | PRN

## 2012-10-07 MED ORDER — FUROSEMIDE 10 MG/ML IJ SOLN
40.0000 mg | Freq: Once | INTRAMUSCULAR | Status: AC
  Administered 2012-10-07: 40 mg via INTRAVENOUS

## 2012-10-07 MED ORDER — TECHNETIUM TO 99M ALBUMIN AGGREGATED: 6.0000 | Freq: Once | INTRAVENOUS | Status: AC | PRN

## 2012-10-07 MED ORDER — FUROSEMIDE 10 MG/ML IJ SOLN
80.0000 mg | Freq: Two times a day (BID) | INTRAMUSCULAR | Status: DC
  Administered 2012-10-07 – 2012-10-09 (×4): 80 mg via INTRAVENOUS
  Filled 2012-10-07 (×5): qty 8

## 2012-10-07 NOTE — Progress Notes (Signed)
[facility] PROGRESS NOTE  [name] WUJ:[id number] DOB: [birth year] DOA: 10/06/2012 PCP: Gwynneth Aliment, MD  Assessment/Plan: Active Problems:   Dyspnea   Respiratory failure, acute and chronic   CHF (congestive heart failure)   COPD (chronic obstructive pulmonary disease)   CKD (chronic kidney disease), stage IV   DM (diabetes mellitus)   GERD (gastroesophageal reflux disease)    1. Dyspnea/Respiratory failure, acute and chronic: Patient presented with progressive SOB, associated with increasing bilateral LE edema, in the setting of O2-dependent COPD/OSA and chronic respiratory failure. She had no fever or chills, cough was productive of whitish phlegm, and wcc was normal. CXR revealed cardiomegaly and pulmonary venous hypertension without overt edema.  Small bilateral pleural effusions. Clinically, the etiology is acute decompensation of systolic CHF, superimposed on reduced respiratory reserve.  2. CHF (congestive heart failure): Patient has known hypertensive cardiomyopathy and CHF, with known EF of 45% per 2D Echocardiogram 06/2012. She has had progressive LE edema in addition to SOB, and ProBNP is elevated at 2416. CXR findings are described in #1 above. Patient has already appreciably responded to iv Lasix and NTG infusion in the ED. We have continued iv Lasix, as well as supportive management. Today, patient appears more SOB. We shall increase Lasix to 80 mg BID, check ABG and V/Q scan.   3. COPD (chronic obstructive pulmonary disease): Not in acute exacerbation clinically, at this time. Managing with bronchodilators.  4. CKD (chronic kidney disease), stage IV: Baseline creatinine is 2.74-3.27, from 08/2012. Creatinine at presentation was 2.76, ie, at baseline. Primary nephrologist is Dr Elvis Coil. Following renal indices. We shall consult nephrologist today, as CKD may be contributory to fluid overload.  5. DM (diabetes mellitus): This is diet-controlled type-2. Random  blood glucose is normal at 96. Managing with diet/SSI.  6. GERD (gastroesophageal reflux disease): Asymptomatic.  7. Gout: Asymptomatic.  8. HTN: BP appears reasonable at this time. Patient was on multiple antihypertensives pre-admisson, which we have continued.    Code Status: Full Code.  Family Communication:  Disposition Plan: To be determined.   Brief narrative: 72 year old female, with history of Obesity, OSA, Asthma/COPD, chronic respiratory failure, s/p Trach, on home O2 at 4L/Min, Chronic Cor Pulmonale, HTN, s/p CVA, Cardiomyopathy, CHF, EF 45% per 2D Echocardiogram 06/2012, CKD-4, Gout, Dyslipidemia, chronic anemia/Thrombocytopenia, Diet-controlled DM-2, GERD, OA, presenting with progressive shortness of breath over 2-3 days, unassociated with chest pain, fever or chills. She has had a mild cough productive of whitish phlegm, as well as progressive bilateral LE swelling. Denies abdominal pain, vomiting or diarrhea. Daughter brought her to the ED on 10/06/12, because of worsening symptoms. In the ED, she was administered iv Lasix and NTG infusion, with amelioration of symptoms. Admitted for further evaluation and management.    Consultants:  N/A.   Procedures:  CXR.   Antibiotics:  N/A.   HPI/Subjective: Feels more SOB today.   Objective: Vital signs in last 24 hours: Temp:  [98.5 F (36.9 C)-98.9 F (37.2 C)] 98.9 F (37.2 C) (08/16 0413) Pulse Rate:  [59-68] 68 (08/16 0804) Resp:  [20-33] 26 (08/16 0804) BP: (131-183)/(59-88) 154/88 mmHg (08/16 0804) SpO2:  [93 %-100 %] 98 % (08/16 0804) FiO2 (%):  [28 %] 28 % (08/16 0804) Weight:  [83.1 kg (183 lb 3.2 oz)] 83.1 kg (183 lb 3.2 oz) (08/16 0500) Weight change:     Intake/Output from previous day: 08/15 0701 - 08/16 0700 In: 309.6 [P.O.:240; I.V.:69.6] Out: 3000 [Urine:3000]     Physical  Exam: General: Alert, communicative, fully oriented, talking in complete sentences, not short of breath at rest.  HEENT:  Mild clinical pallor, no jaundice, no conjunctival injection or discharge.  NECK: Supple, JVP not seen, no carotid bruits, no palpable lymphadenopathy, no palpable goiter. Patient has trach collar with Passey-Muir valve.  CHEST: Bilbasal crackles and wheeze.  HEART: Sounds 1 and 2 heard, normal, regular, no murmurs.  ABDOMEN: Moderately obese, soft, non-tender, no palpable organomegaly, no palpable masses, normal bowel sounds.  GENITALIA: Not examined.  LOWER EXTREMITIES: Mild-moderate pitting edema, palpable peripheral pulses.  MUSCULOSKELETAL SYSTEM: Generalized osteoarthritic changes, otherwise, normal.  CENTRAL NERVOUS SYSTEM: No focal neurologic deficit on gross examination.   Lab Results:  Recent Labs  10/06/12 1058 10/07/12 0600  WBC 5.9 8.2  HGB 8.8* 8.4*  HCT 27.8* 27.0*  PLT 188 209    Recent Labs  10/06/12 0715 10/06/12 1058 10/07/12 0600  NA 142  --  141  K 3.6  --  3.7  CL 106  --  104  CO2 21  --  28  GLUCOSE 96  --  107*  BUN 45*  --  49*  CREATININE 2.76* 2.81* 2.81*  CALCIUM 9.5  --  8.9   Recent Results (from the past 240 hour(s))  MRSA PCR SCREENING     Status: None   Collection Time    10/06/12  1:09 PM      Result Value Range Status   MRSA by PCR NEGATIVE  NEGATIVE Final   Comment:            The GeneXpert MRSA Assay (FDA     approved for NASAL specimens     only), is one component of a     comprehensive MRSA colonization     surveillance program. It is not     intended to diagnose MRSA     infection nor to guide or     monitor treatment for     MRSA infections.     Studies/Results: Dg Chest 1 View  10/06/2012   *RADIOLOGY REPORT*  Clinical Data: Shortness of breath.  Fluid overload.  Current history of chronic renal insufficiency, COPD, sleep apnea, asthma, hypertension, diabetes.  Prior history of CHF.  CHEST - 1 VIEW  Comparison: Portable chest x-ray earlier same date 0732 hours, 09/13/2012, 09/03/2012, 03/17/2012.  Findings: Lateral  view of the chest read in conjunction with the AP portable chest x-ray earlier today demonstrates suboptimal inspiration with crowded bronchovascular markings in the lower lobes.  Cardiac silhouette moderately to markedly enlarged. Pulmonary venous hypertension without overt edema.  Very small bilateral pleural effusions.  Degenerative changes involving the thoracic spine.  IMPRESSION: Cardiomegaly and pulmonary venous hypertension without overt edema. Small bilateral pleural effusions.   Original Report Authenticated By: Hulan Saas, M.D.   Dg Chest Portable 1 View  10/06/2012   *RADIOLOGY REPORT*  Clinical Data: Shortness of breath.  PORTABLE CHEST - 1 VIEW  Comparison: 09/13/2012.  Findings: The tracheostomy tube is in good position.  Heart is enlarged but stable.  Stable elevation of the left hemidiaphragm. Mild chronic vascular congestion without overt pulmonary edema or pleural effusion.  IMPRESSION:  1.  Stable tracheostomy tube. 2.  Stable cardiac enlargement and mild chronic vascular congestion. 3.  No overt pulmonary edema, focal infiltrates or effusions.   Original Report Authenticated By: Rudie Meyer, M.D.    Medications: Scheduled Meds: . albuterol  2.5 mg Nebulization QID  . amLODipine  10 mg Oral Daily  .  antiseptic oral rinse  15 mL Mouth Rinse Daily  . arformoterol  15 mcg Nebulization BID  . aspirin  81 mg Oral Daily  . atorvastatin  40 mg Oral q1800  . budesonide  0.25 mg Nebulization BID  . busPIRone  5 mg Oral BID  . carvedilol  6.25 mg Oral BID WC  . cloNIDine  0.1 mg Oral BID  . clopidogrel  75 mg Oral Q breakfast  . dextromethorphan-guaiFENesin  1 tablet Oral BID  . ferrous sulfate  325 mg Oral BID  . furosemide  40 mg Intravenous BID  . heparin  5,000 Units Subcutaneous Q8H  . hydrALAZINE  100 mg Oral TID  . insulin aspart  0-9 Units Subcutaneous TID WC  . ipratropium  0.5 mg Nebulization QID  . isosorbide mononitrate  30 mg Oral Daily  . metoCLOPramide  5 mg  Oral QID  . pantoprazole  40 mg Oral Daily  . sertraline  100 mg Oral QHS  . sodium chloride  3 mL Intravenous Q12H  . vitamin C  500 mg Oral Daily   Continuous Infusions:  PRN Meds:.sodium chloride, acetaminophen, acetaminophen, albuterol, LORazepam, morphine injection, ondansetron (ZOFRAN) IV, ondansetron, oxyCODONE, sodium chloride, sodium chloride    LOS: 1 day   Brooklin Rieger,CHRISTOPHER  [facility] Pager [phone]. If 8PM-8AM, please contact night-coverage at [email or web address], password [facility] 10/07/2012, 10:18 AM  LOS: 1 day

## 2012-10-07 NOTE — Consult Note (Signed)
[facility] CONSULT NOTE    Date: 10/07/2012                  Patient Name:  [name]  MRN: [id number]  DOB: August 10, 1940  Age / Sex: [age] y.o., female         PCP: Felicia Aliment, MD                 Service Requesting Consult: [facility] hospitalist                 Reason for Consult: CKD stage 4, volume overload            History of Present Illness: [name] is a 72 y.o. African American female with extensive PMHx significant for CKD stage 4 (follow with [name]) CHF EF 45% with hypokinesis and grade 2 diastolic dysfunction (echo 06/2012), obesity, OSA, COPD, s/p Trach, on home O2 at 4L, Chronic Cor Pulmonale, HTN, s/p CVA, Cardiomyopathy, s/p cardopulmonary arrest 01/2012, chronic anemia/Thrombocytopenia, and DM-2, who was admitted to [facility] on 10/06/2012 for evaluation of worsening SOB and lower extremity edema.  She was admitted for acute on chronic respiratory failure secondary to presumed volume overload with acute on chronic CHF.  CXR on admission showed cardiomegaly with pulmonary venous HTN without overt edema and small b/l pleural effusions.  Her weight this admission is up to 183lbs today, up from 169lbs on 09/04/12.  Last hospital admission 08/20/12-09/14/12 for acute on chronic respiratory failure.  She does not recall the last time she saw [name], as she says every time she has an appointment she ends up in the hospital before.  We were consulted for CKD and volume overload.  Medications: Outpatient medications: Prescriptions prior to admission  Medication Sig Dispense Refill  . albuterol (PROVENTIL) (5 MG/ML) 0.5% nebulizer solution Take 0.5 mLs (2.5 mg total) by nebulization 4 (four) times daily.  20 mL  12  . amLODipine (NORVASC) 10 MG tablet Take 10 mg by mouth daily.      Marland Kitchen arformoterol (BROVANA) 15 MCG/2ML NEBU Take 2 mLs (15 mcg total) by nebulization 2 (two) times daily.  120 mL  1  . Ascorbic Acid (VITAMIN C PO) Take 1 tablet by  mouth daily.      Marland Kitchen aspirin 81 MG chewable tablet Chew 1 tablet (81 mg total) by mouth daily.      Marland Kitchen atorvastatin (LIPITOR) 40 MG tablet Take 1 tablet (40 mg total) by mouth daily.  30 tablet  1  . budesonide (PULMICORT) 0.25 MG/2ML nebulizer solution Take 2 mLs (0.25 mg total) by nebulization 2 (two) times daily.  60 mL  12  . busPIRone (BUSPAR) 5 MG tablet Take 5 mg by mouth 2 (two) times daily.      . carvedilol (COREG) 6.25 MG tablet Take 1 tablet (6.25 mg total) by mouth 2 (two) times daily with a meal.  60 tablet  1  . cloNIDine (CATAPRES) 0.2 MG tablet Take 0.5 tablets (0.1 mg total) by mouth 2 (two) times daily.  60 tablet  11  . clopidogrel (PLAVIX) 75 MG tablet Take 1 tablet (75 mg total) by mouth daily with breakfast.  30 tablet  11  . dextromethorphan-guaiFENesin (MUCINEX DM) 30-600 MG per 12 hr tablet Take 1 tablet by mouth 2 (two) times daily.  60 tablet  0  . ferrous sulfate 325 (65 FE) MG EC tablet Take 1 tablet (325 mg total) by mouth 2 (two) times daily.  60 tablet  3  . hydrALAZINE (APRESOLINE) 100 MG tablet Take 100 mg by mouth 3 (three) times daily.      Marland Kitchen ipratropium (ATROVENT) 0.02 % nebulizer solution Take 2.5 mLs (0.5 mg total) by nebulization 4 (four) times daily.  75 mL  12  . isosorbide mononitrate (IMDUR) 30 MG 24 hr tablet Take 1 tablet (30 mg total) by mouth daily.  30 tablet  1  . LORazepam (ATIVAN) 0.5 MG tablet Take 1 tablet (0.5 mg total) by mouth every 6 (six) hours as needed for anxiety.  30 tablet  0  . metoCLOPramide (REGLAN) 5 MG tablet Take 5 mg by mouth 4 (four) times daily.      Marland Kitchen oxycodone (OXY-IR) 5 MG capsule Take 5 mg by mouth every 6 (six) hours as needed (for moderate pain).      . pantoprazole (PROTONIX) 20 MG tablet Take 40 mg by mouth daily.      . sertraline (ZOLOFT) 100 MG tablet Take 100 mg by mouth at bedtime.      . torsemide (DEMADEX) 20 MG tablet Take 20 mg by mouth daily.      . Artificial Saliva (BIOTENE MOISTURIZING MOUTH) SOLN Use as  directed 1 spray in the mouth or throat daily.       Current medications: Current Facility-Administered Medications  Medication Dose Route Frequency Provider Last Rate Last Dose  . 0.9 %  sodium chloride infusion  250 mL Intravenous PRN Felicia Norman, MD      . acetaminophen (TYLENOL) tablet 650 mg  650 mg Oral Q6H PRN Felicia Norman, MD       Or  . acetaminophen (TYLENOL) suppository 650 mg  650 mg Rectal Q6H PRN Felicia Norman, MD      . albuterol (PROVENTIL) (5 MG/ML) 0.5% nebulizer solution 2.5 mg  2.5 mg Nebulization QID Felicia Norman, MD   2.5 mg at 10/07/12 0801  . albuterol (PROVENTIL) (5 MG/ML) 0.5% nebulizer solution 2.5 mg  2.5 mg Nebulization Q2H PRN Felicia Norman, MD      . amLODipine (NORVASC) tablet 10 mg  10 mg Oral Daily Felicia Norman, MD   10 mg at 10/07/12 1035  . antiseptic oral rinse (BIOTENE) solution 15 mL  15 mL Mouth Rinse Daily Felicia Norman, MD   15 mL at 10/07/12 1036  . arformoterol (BROVANA) nebulizer solution 15 mcg  15 mcg Nebulization BID Felicia Norman, MD   15 mcg at 10/07/12 0801  . aspirin chewable tablet 81 mg  81 mg Oral Daily Felicia Norman, MD   81 mg at 10/07/12 1035  . atorvastatin (LIPITOR) tablet 40 mg  40 mg Oral q1800 Felicia Norman, MD   40 mg at 10/06/12 1733  . budesonide (PULMICORT) nebulizer solution 0.25 mg  0.25 mg Nebulization BID Felicia Norman, MD   0.25 mg at 10/07/12 0801  . busPIRone (BUSPAR) tablet 5 mg  5 mg Oral BID Felicia Norman, MD   5 mg at 10/07/12 1036  . carvedilol (COREG) tablet 6.25 mg  6.25 mg Oral BID WC Felicia Norman, MD   6.25 mg at 10/07/12 0831  . cloNIDine (CATAPRES) tablet 0.1 mg  0.1 mg Oral BID Felicia Norman, MD   0.1 mg at 10/07/12 1037  . clopidogrel (PLAVIX) tablet 75 mg  75 mg Oral Q breakfast Felicia Norman, MD   75 mg at 10/07/12 0831  . dextromethorphan-guaiFENesin (MUCINEX DM) 30-600  MG per 12 hr tablet 1 tablet  1 tablet Oral BID Felicia Norman, MD   1 tablet at 10/07/12 1036  . ferrous sulfate tablet 325 mg  325 mg Oral BID Felicia Norman, MD    325 mg at 10/07/12 1036  . furosemide (LASIX) injection 40 mg  40 mg Intravenous Once Felicia Norman, MD      . furosemide (LASIX) injection 80 mg  80 mg Intravenous BID Felicia Norman, MD      . heparin injection 5,000 Units  5,000 Units Subcutaneous Q8H Felicia Norman, MD   5,000 Units at 10/07/12 0645  . hydrALAZINE (APRESOLINE) tablet 100 mg  100 mg Oral TID Felicia Norman, MD   100 mg at 10/07/12 1035  . insulin aspart (novoLOG) injection 0-9 Units  0-9 Units Subcutaneous TID WC Felicia Norman, MD      . ipratropium (ATROVENT) nebulizer solution 0.5 mg  0.5 mg Nebulization QID Felicia Norman, MD   0.5 mg at 10/07/12 0801  . isosorbide mononitrate (IMDUR) 24 hr tablet 30 mg  30 mg Oral Daily Felicia Norman, MD   30 mg at 10/07/12 1037  . LORazepam (ATIVAN) tablet 0.5 mg  0.5 mg Oral Q6H PRN Felicia Norman, MD      . metoCLOPramide (REGLAN) tablet 5 mg  5 mg Oral QID Felicia Norman, MD   5 mg at 10/07/12 1037  . morphine 2 MG/ML injection 2 mg  2 mg Intravenous Q4H PRN Felicia Norman, MD      . ondansetron [facility]) tablet 4 mg  4 mg Oral Q6H PRN Felicia Norman, MD       Or  . ondansetron [facility]) injection 4 mg  4 mg Intravenous Q6H PRN Felicia Norman, MD      . oxyCODONE (Oxy IR/ROXICODONE) immediate release tablet 5 mg  5 mg Oral Q4H PRN Felicia Norman, MD      . pantoprazole (PROTONIX) EC tablet 40 mg  40 mg Oral Daily Felicia Norman, MD   40 mg at 10/07/12 1035  . sertraline (ZOLOFT) tablet 100 mg  100 mg Oral QHS Felicia Norman, MD   100 mg at 10/06/12 2156  . sodium chloride 0.9 % injection 3 mL  3 mL Intravenous PRN Darlys Gales, MD      . sodium chloride 0.9 % injection 3 mL  3 mL Intravenous Q12H Felicia Norman, MD   3 mL at 10/07/12 1043  . sodium chloride 0.9 % injection 3 mL  3 mL Intravenous PRN Felicia Norman, MD      . vitamin C (ASCORBIC ACID) tablet 500 mg  500 mg Oral Daily Felicia Norman, MD   500 mg at 10/07/12 1035    Allergies: Allergies  Allergen Reactions  . Other Itching    "Wool"  . Sulfonamide Derivatives Hives  and Rash    Past Medical History: Past Medical History  Diagnosis Date  . COPD (chronic obstructive pulmonary disease)     on home o2  . Chronic renal insufficiency   . CVA (cerebral infarction)     hx  . Hypertensive heart disease with congestive heart failure   . Chronic cor pulmonale   . Cardiomyopathy of undetermined type 09/07/2011  . Chronic kidney disease (CKD), stage IV (severe)   . Hyperlipdemia   . Obesity (BMI 30-39.9)   . Sleep apnea   . History of gout 09/07/2011  .  Heart failure 7/13  . Shortness of breath   . Anemia   . Vascular disease   . Asthma   . Anginal pain   . Hypertension   . Type II or unspecified type diabetes mellitus without mention of complication, not stated as uncontrolled   . Stroke   . GERD (gastroesophageal reflux disease)   . Arthritis   . Bundle branch block   . Thrombocytopenia   . Acute and chronic respiratory failure   . Obesity, unspecified 08/28/2012   Past Surgical History: Past Surgical History  Procedure Laterality Date  . Bilateral oophorectomy    . Cardiac catheterization      right heart cath  . Laparoscopic gastrostomy  12/21/2011    Procedure: LAPAROSCOPIC GASTROSTOMY;  Surgeon: Axel Filler, MD;  Location: [facility] OR;  Service: General;  Laterality: N/A;  laparoscopic gastrostomy possible open feeding tube  . Abdominal hysterectomy    . Tracheostomy  11/2011  . Cataracts    . Tracheostomy tube placement  02/18/2012    Procedure: TRACHEOSTOMY;  Surgeon: Serena Colonel, MD;  Location: [facility] OR;  Service: ENT;  Laterality: N/A;  . Tracheostomy revision N/A 09/01/2012    Procedure: TRACHEOSTOMY REVISION;  Surgeon: Serena Colonel, MD;  Location: [facility] OR;  Service: ENT;  Laterality: N/A;   Family History: Family History  Problem Relation Age of Onset  . Heart attack Father   . Stroke Mother     CVA  . Coronary artery disease Daughter    Social History: History   Social History  . Marital Status: Widowed    Spouse Name: N/A     Number of Children: 1  . Years of Education: N/A   Occupational History  . Not on file.   Social History Main Topics  . Smoking status: Former Smoker -- 1.00 packs/day for 55 years    Types: Cigarettes    Quit date: 08/28/2011  . Smokeless tobacco: Never Used  . Alcohol Use: No  . Drug Use: No  . Sexual Activity: No   Other Topics Concern  . Not on file   Social History Narrative   Widow,  lives alone   Review of Systems:  Constitutional:  Fatigue.  Denies fever  HEENT:  Denies congestion, sore throat, rhinorrhea, sneezing, mouth sores, trouble swallowing, neck pain   Respiratory:  SOB, cough, wheezing.   Cardiovascular:  Leg swelling.   Gastrointestinal:  Denies nausea, vomiting, abdominal pain  Genitourinary:  Foley in place  Musculoskeletal:  Generalized body pain   Skin:  Denies pallor, rash  Neurological:  Denies dizziness   Vital Signs: Blood pressure 168/75, pulse 62, temperature 98.6 F (37 C), temperature source Oral, resp. rate 22, height 5\' 4"  (1.626 m), weight 183 lb 3.2 oz (83.1 kg), SpO2 95.00%.  Weight trends: Filed Weights   10/06/12 0835 10/07/12 0500  Weight: 179 lb (81.194 kg) 183 lb 3.2 oz (83.1 kg)   Vitals reviewed. General: resting in bed, trach in place, on [place] O2, not talking much HEENT: PERRL, EOMI Cardiac: RRR Pulm: b/l anterior expiratory wheezing Abd: soft, obese, nontender, nondistended, BS present Ext: warm and well perfused, mild lower extremity edema Neuro: alert, answering some questions, moving all 4 extremities  Lab results: Basic Metabolic Panel:  Recent Labs Lab 10/06/12 0715 10/06/12 1058 10/07/12 0600  NA 142  --  141  K 3.6  --  3.7  CL 106  --  104  CO2 21  --  28  GLUCOSE 96  --  107*  BUN 45*  --  49*  CREATININE 2.76* 2.81* 2.81*  CALCIUM 9.5  --  8.9   Liver Function Tests:  Recent Labs Lab 10/06/12 0715 10/07/12 0600  AST 15 11  ALT 8 6  ALKPHOS 69 63  BILITOT 0.3 0.2*  PROT 6.1 5.6*  ALBUMIN  3.0* 2.8*   CBC:  Recent Labs Lab 10/06/12 0715 10/06/12 1058 10/07/12 0600  WBC 6.8 5.9 8.2  HGB 9.2* 8.8* 8.4*  HCT 30.0* 27.8* 27.0*  MCV 83.3 81.8 81.6  PLT 209 188 209   Cardiac Enzymes:  Recent Labs Lab 10/06/12 0830 10/06/12 1432 10/06/12 2300  TROPONINI <0.30 <0.30 <0.30   BNP    Component Value Date/Time   PROBNP 3536.0* 10/07/2012 0600   CBG:  Recent Labs Lab 10/06/12 1549 10/06/12 2126 10/07/12 0830  GLUCAP 95 107* 107*   Microbiology: Results for orders placed during the hospital encounter of 10/06/12  MRSA PCR SCREENING     Status: None   Collection Time    10/06/12  1:09 PM      Result Value Range Status   MRSA by PCR NEGATIVE  NEGATIVE Final   Comment:            The GeneXpert MRSA Assay (FDA     approved for NASAL specimens     only), is one component of a     comprehensive MRSA colonization     surveillance program. It is not     intended to diagnose MRSA     infection nor to guide or     monitor treatment for     MRSA infections.   Urinalysis:  Recent Labs  10/06/12 0741  COLORURINE YELLOW  LABSPEC 1.012  PHURINE 6.0  GLUCOSEU NEGATIVE  HGBUR NEGATIVE  BILIRUBINUR NEGATIVE  KETONESUR NEGATIVE  PROTEINUR >300*  UROBILINOGEN 0.2  NITRITE NEGATIVE  LEUKOCYTESUR NEGATIVE    Imaging: Dg Chest 1 View  10/06/2012   *RADIOLOGY REPORT*  Clinical Data: Shortness of breath.  Fluid overload.  Current history of chronic renal insufficiency, COPD, sleep apnea, asthma, hypertension, diabetes.  Prior history of CHF.  CHEST - 1 VIEW  Comparison: Portable chest x-ray earlier same date 0732 hours, 09/13/2012, 09/03/2012, 03/17/2012.  Findings: Lateral view of the chest read in conjunction with the AP portable chest x-ray earlier today demonstrates suboptimal inspiration with crowded bronchovascular markings in the lower lobes.  Cardiac silhouette moderately to markedly enlarged. Pulmonary venous hypertension without overt edema.  Very small  bilateral pleural effusions.  Degenerative changes involving the thoracic spine.  IMPRESSION: Cardiomegaly and pulmonary venous hypertension without overt edema. Small bilateral pleural effusions.   Original Report Authenticated By: Hulan Saas, M.D.   Dg Chest Port 1 View  10/07/2012   *RADIOLOGY REPORT*  Clinical Data: Dyspnea  PORTABLE CHEST - 1 VIEW  Comparison: 10/06/2012  Findings: Cardiac enlargement stable.  Tracheostomy tube unchanged. Right lung is clear.  Cannot evaluate the retrocardiac area well on single AP view due to superimposition of cardiac silhouette.  There appears to be discoid atelectasis in the lingula.  The diaphragm is visible.  IMPRESSION: Discoid atelectasis lingula.  Cannot exclude retrocardiac opacities.  Right lung is clear.   Original Report Authenticated By: Esperanza Heir, M.D.   Dg Chest Portable 1 View  10/06/2012   *RADIOLOGY REPORT*  Clinical Data: Shortness of breath.  PORTABLE CHEST - 1 VIEW  Comparison: 09/13/2012.  Findings: The tracheostomy tube is in good position.  Heart is enlarged but  stable.  Stable elevation of the left hemidiaphragm. Mild chronic vascular congestion without overt pulmonary edema or pleural effusion.  IMPRESSION:  1.  Stable tracheostomy tube. 2.  Stable cardiac enlargement and mild chronic vascular congestion. 3.  No overt pulmonary edema, focal infiltrates or effusions.   Original Report Authenticated By: Rudie Meyer, M.D.     Assessment & Plan: Ms. Vancamp is a 71 y.o. female with extensive PMHx significant for CKD stage 4 (follow with [name]), CHF EF 45% with hypokinesis and grade 2 diastolic dysfunction (echo 06/2012), and COPD/asthma with OSA and trach and oxygen dependent admitted to [facility] on 10/06/2012 for acute on chronic respiratory failure.    CKD stage 4--Cr on admission 2.76 (around baseline) and follows with [name]. Hx of HTN but does not have hx of diabetes.  Baseline Cr appears to be 2.5-2.8.  Last Cr on epic 3.14  on prior hospital admission. CT abdomen 12/2011: b/l adrenal adenomas and renal cysts.  Renal ultrasound 08/2011: b/l renal cysts with R kidney 10.7cm and L 11cm.  U/a on admission continues to show proteinuria >300.  Albumin on admission 2.6.  CXR on admission with cardiomegaly and pulmonary venous HTN without overt edema. Small b/l pleural effusions.  Currently on lasix.   -trend renal function -urine protein/Cr ratio -will obtain records from office -daily weights -strict I/Os  Acute on chronic respiratory failure--trach dependent with hx of OSA, COPD/asthma, and o2 dependent on 4L [place] at home.  Continues to complain of worsening SOB.  ?secondary to acute decompensated CHF. Wheezing on physical exam.  No clear infiltrate on cxr. VQ scan not suggestive of acute PE. -ABG pending -management per primary team  CHF--EF 45% with hypokinesis and grade 2 diastolic dysfunction per echo 07/2128. Presenting with worsening SOB and lower extremity edema.  Weight appears slightly up from baseline, to 183lb today and was 179lb on admission.  Home regimen includes: norvasc, asa, lipitor, buspar, coreg, clonidine, plavix, hydralazine, IMDUR, and torsemide.  On IV lasix at this time. proBNP up to 3536 today and CXR on admission showed cardiomegaly and pulmonary venous hypertension without overt edema.  Small b/l pleural effusions. -management per primary team  Case discussed with Dr. Arlean Hopping.   Signed: Darden Palmer, MD PGY-2, Internal Medicine Resident Pager: [phone]  10/07/2012,2:57 PM  Patient seen and examined.  Agree with assessment and plan as above.  72 yo with CKD stage IV f/b CKA, CVA, DM2, COPD on home O2, gout, OSA, CHF, PVD, and morbid obesity.  Pt presents with SOB. Exam and cxr do not support volume overload or pulm edema. She likely has OHS and has ABG's in the past with CO2 retention.  V/Q scan ordered.  Agree with IV lasix 80 q 12 hrs.  No uremic signs or symptoms. Will follow.  Vinson Moselle   MD Pager [phone]    Cell  [phone] 10/07/2012, 4:55 PM

## 2012-10-08 DIAGNOSIS — E119 Type 2 diabetes mellitus without complications: Secondary | ICD-10-CM

## 2012-10-08 LAB — RENAL FUNCTION PANEL
Albumin: 2.8 g/dL — ABNORMAL LOW (ref 3.5–5.2)
BUN: 43 mg/dL — ABNORMAL HIGH (ref 6–23)
Chloride: 101 mEq/L (ref 96–112)
GFR calc Af Amer: 19 mL/min — ABNORMAL LOW (ref >90)
GFR calc non Af Amer: 16 mL/min — ABNORMAL LOW (ref >90)
Potassium: 3.6 mEq/L (ref 3.5–5.1)
Sodium: 138 mEq/L (ref 135–145)

## 2012-10-08 LAB — CBC
Hemoglobin: 8.2 g/dL — ABNORMAL LOW (ref 12.0–15.0)
MCH: 25.3 pg — ABNORMAL LOW (ref 26.0–34.0)
MCHC: 31.1 g/dL (ref 30.0–36.0)
Platelets: 168 10*3/uL (ref 150–400)
RBC: 3.52 MIL/uL — ABNORMAL LOW (ref 3.87–5.11)
WBC: 7.7 10*3/uL (ref 4.0–10.5)

## 2012-10-08 LAB — GLUCOSE, CAPILLARY
Glucose-Capillary: 128 mg/dL — ABNORMAL HIGH (ref 70–99)
Glucose-Capillary: 88 mg/dL (ref 70–99)
Glucose-Capillary: 98 mg/dL (ref 70–99)

## 2012-10-08 LAB — PRO B NATRIURETIC PEPTIDE: Pro B Natriuretic peptide (BNP): 3856 pg/mL — ABNORMAL HIGH (ref 0–125)

## 2012-10-08 LAB — MAGNESIUM: Magnesium: 1.9 mg/dL (ref 1.5–2.5)

## 2012-10-08 MED ORDER — DARBEPOETIN ALFA-POLYSORBATE 100 MCG/0.5ML IJ SOLN
100.0000 ug | Freq: Once | INTRAMUSCULAR | Status: AC
  Administered 2012-10-08: 100 ug via SUBCUTANEOUS
  Filled 2012-10-08 (×4): qty 0.5

## 2012-10-08 NOTE — Progress Notes (Signed)
[facility] ROUNDING NOTE   Subjective:   Ms. Placide was seen and examined at bedside this morning.  She was sleepy but did stay awake to answer questions.  She reports mild improvement in her breathing but is still coughing.  Tmax 100.60F this morning.  She denies dysuria or abdominal pain this morning.  Objective:  Vital signs in last 24 hours:  Temp:  [98.5 F (36.9 C)-100.1 F (37.8 C)] 100.1 F (37.8 C) (08/17 0405) Pulse Rate:  [62-72] 66 (08/17 0800) Resp:  [15-31] 22 (08/17 0800) BP: (140-168)/(59-95) 163/65 mmHg (08/17 0800) SpO2:  [95 %-100 %] 98 % (08/17 0800) FiO2 (%):  [28 %] 28 % (08/17 0717) Weight:  [183 lb 6.8 oz (83.2 kg)] 183 lb 6.8 oz (83.2 kg) (08/17 0405)  Weight change: 4 lb 6.8 oz (2.006 kg) Filed Weights   10/06/12 0835 10/07/12 0500 10/08/12 0405  Weight: 179 lb (81.194 kg) 183 lb 3.2 oz (83.1 kg) 183 lb 6.8 oz (83.2 kg)   Intake/Output: I/O last 3 completed shifts: In: 779.6 [P.O.:710; I.V.:69.6] Out: 3425 [Urine:3425]  Physical Exam:  Vitals reviewed.  General:sleeping in bed, trach in place HEENT: PERRL, EOMI  Cardiac: RRR  Pulm: diffuse expiratory wheezing  Abd: soft, obese, nontender, nondistended, BS present  Ext: warm and well perfused, mild lower extremity edema  Neuro: alert, answering some questions, moving all 4 extremities  Basic Metabolic Panel:  Recent Labs Lab 10/06/12 0715 10/06/12 1058 10/07/12 0600 10/07/12 1230 10/08/12 0850  NA 142  --  141 139 138  K 3.6  --  3.7 3.7 3.6  CL 106  --  104 103 101  CO2 21  --  28 28 29   GLUCOSE 96  --  107* 97 101*  BUN 45*  --  49* 46* 43*  CREATININE 2.76* 2.81* 2.81* 2.73* 2.75*  CALCIUM 9.5  --  8.9 9.0 9.5  MG  --   --   --   --  1.9  PHOS  --   --   --  3.3 3.4   Liver Function Tests:  Recent Labs Lab 10/06/12 0715 10/07/12 0600 10/07/12 1230 10/08/12 0850  AST 15 11  --   --   ALT 8 6  --   --   ALKPHOS 69 63  --   --   BILITOT 0.3 0.2*  --   --    PROT 6.1 5.6*  --   --   ALBUMIN 3.0* 2.8* 2.6* 2.8*   CBC:  Recent Labs Lab 10/06/12 0715 10/06/12 1058 10/07/12 0600 10/08/12 0420 10/08/12 0850  WBC 6.8 5.9 8.2 7.3 7.7  HGB 9.2* 8.8* 8.4* 8.2* 9.1*  HCT 30.0* 27.8* 27.0* 26.4* 28.3*  MCV 83.3 81.8 81.6 81.5 80.4  PLT 209 188 209 194 168   Cardiac Enzymes:  Recent Labs Lab 10/06/12 0830 10/06/12 1432 10/06/12 2300  TROPONINI <0.30 <0.30 <0.30   BNP    Component Value Date/Time   PROBNP 3856.0* 10/08/2012 0420   CBG:  Recent Labs Lab 10/07/12 0830 10/07/12 1122 10/07/12 1643 10/07/12 1938 10/08/12 0744  GLUCAP 107* 118* 150* 124* 128*   Microbiology: Results for orders placed during the hospital encounter of 10/06/12  MRSA PCR SCREENING     Status: None   Collection Time    10/06/12  1:09 PM      Result Value Range Status   MRSA by PCR NEGATIVE  NEGATIVE Final   Comment:  The GeneXpert MRSA Assay (FDA     approved for NASAL specimens     only), is one component of a     comprehensive MRSA colonization     surveillance program. It is not     intended to diagnose MRSA     infection nor to guide or     monitor treatment for     MRSA infections.   Urinalysis:  Recent Labs  10/06/12 0741  COLORURINE YELLOW  LABSPEC 1.012  PHURINE 6.0  GLUCOSEU NEGATIVE  HGBUR NEGATIVE  BILIRUBINUR NEGATIVE  KETONESUR NEGATIVE  PROTEINUR >300*  UROBILINOGEN 0.2  NITRITE NEGATIVE  LEUKOCYTESUR NEGATIVE    Imaging: Nm Pulmonary Perf And Vent  10/07/2012   CLINICAL DATA:  Dyspnea.  EXAM: NUCLEAR MEDICINE VENTILATION - PERFUSION LUNG SCAN  TECHNIQUE: Ventilation images were obtained in multiple projections using inhaled aerosol technetium 99 M DTPA. Perfusion images were obtained in multiple projections after intravenous injection of Tc-57m MAA.  COMPARISON:  None.  RADIOPHARMACEUTICALS:  40 mCi Tc-52m DTPA aerosol and 6 mCi Tc-73m MAA  FINDINGS: Ventilation: None diagnostic DTPA ventilation images  the due to tracheostomy.  Perfusion: No wedge shaped peripheral perfusion defects to suggest acute pulmonary embolism.  IMPRESSION: No pulmonary perfusion defects to suggest acute pulmonary embolism.   Electronically Signed   By: Myles Rosenthal   On: 10/07/2012 14:22   Dg Chest Port 1 View  10/07/2012   *RADIOLOGY REPORT*  Clinical Data: Dyspnea  PORTABLE CHEST - 1 VIEW  Comparison: 10/06/2012  Findings: Cardiac enlargement stable.  Tracheostomy tube unchanged. Right lung is clear.  Cannot evaluate the retrocardiac area well on single AP view due to superimposition of cardiac silhouette.  There appears to be discoid atelectasis in the lingula.  The diaphragm is visible.  IMPRESSION: Discoid atelectasis lingula.  Cannot exclude retrocardiac opacities.  Right lung is clear.   Original Report Authenticated By: Esperanza Heir, M.D.    Medications:     . albuterol  2.5 mg Nebulization QID  . amLODipine  10 mg Oral Daily  . antiseptic oral rinse  15 mL Mouth Rinse Daily  . arformoterol  15 mcg Nebulization BID  . aspirin  81 mg Oral Daily  . atorvastatin  40 mg Oral q1800  . budesonide  0.25 mg Nebulization BID  . busPIRone  5 mg Oral BID  . carvedilol  6.25 mg Oral BID WC  . cloNIDine  0.1 mg Oral BID  . clopidogrel  75 mg Oral Q breakfast  . darbepoetin (ARANESP) injection - NON-DIALYSIS  100 mcg Subcutaneous Once  . dextromethorphan-guaiFENesin  1 tablet Oral BID  . ferrous sulfate  325 mg Oral BID  . furosemide  80 mg Intravenous BID  . heparin  5,000 Units Subcutaneous Q8H  . hydrALAZINE  100 mg Oral TID  . insulin aspart  0-9 Units Subcutaneous TID WC  . ipratropium  0.5 mg Nebulization QID  . isosorbide mononitrate  30 mg Oral Daily  . metoCLOPramide  5 mg Oral QID  . pantoprazole  40 mg Oral Daily  . sertraline  100 mg Oral QHS  . sodium chloride  3 mL Intravenous Q12H  . vitamin C  500 mg Oral Daily   sodium chloride, acetaminophen, acetaminophen, albuterol, LORazepam, morphine  injection, ondansetron (ZOFRAN) IV, ondansetron, oxyCODONE, sodium chloride, sodium chloride  Assessment/ Plan:  Ms. Marker is a 72 y.o. female with extensive PMHx significant for CKD stage 4 (follow with Dr. Hyman Hopes), CHF EF 45% with hypokinesis  and grade 2 diastolic dysfunction (echo 06/2012), and COPD/asthma with OSA and trach and oxygen dependent admitted to [facility] on 10/06/2012 for acute on chronic respiratory failure.   CKD stage 4--Cr on admission 2.76 (around baseline) and follows with Dr. Hyman Hopes. Hx of HTN and baseline Cr appears to be 2.5-2.8. Urine protein to creatinine ration 3.68.  Currently on lasix.  -trend renal function  -continue diuresis -daily weights  -strict I/Os: -4.495 L since admission  Acute on chronic respiratory failure--trach dependent with hx of OSA, COPD/asthma, and o2 dependent on 4L [place] at home. ?secondary to acute decompensated CHF. Wheezing on physical exam. No clear infiltrate on cxr. VQ scan not suggestive of acute PE.  -management per primary team   Anemia of chronic disease in setting of CKD stage 4: Hb 8.8 on admission.  Iron panel 08/2012: Iron 164, Ferritin 35, TIBC 225, Folate 10.6, sat ratios 73%.  -trend Hb   -arenesp x1 today  CHF--EF 45% with hypokinesis and grade 2 diastolic dysfunction per echo 02/6107. Presenting with worsening SOB and lower extremity edema. Weight appears slightly up from baseline, to 183lb today and was 179lb on admission. Home regimen includes: norvasc, asa, lipitor, buspar, coreg, clonidine, plavix, hydralazine, IMDUR, and torsemide. On IV lasix at this time.  -management per primary team   Case discussed and patient seen with Dr. Hyman Hopes.     LOS: 2

## 2012-10-08 NOTE — Progress Notes (Signed)
I have seen and examined this patient and agree with the plan of care  . Unfortunate situation with exacerbation of CHF. Would continue diuretics for now.  [name] 10/08/2012, 1:57 PM

## 2012-10-08 NOTE — Progress Notes (Signed)
[facility] PROGRESS NOTE  [name] JYN:[id number] DOB: [birth year] DOA: 10/06/2012 PCP: Gwynneth Aliment, MD  Assessment/Plan: Active Problems:   Dyspnea   Respiratory failure, acute and chronic   CHF (congestive heart failure)   COPD (chronic obstructive pulmonary disease)   CKD (chronic kidney disease), stage IV   DM (diabetes mellitus)   GERD (gastroesophageal reflux disease)    1. Dyspnea/Respiratory failure, acute and chronic: Patient presented with progressive SOB, associated with increasing bilateral LE edema, in the setting of O2-dependent COPD/OSA and chronic respiratory failure. She had no fever or chills, cough was productive of whitish phlegm, and wcc was normal. CXR revealed cardiomegaly and pulmonary venous hypertension without overt edema.  Small bilateral pleural effusions. Clinically, the etiology is acute decompensation of systolic CHF, superimposed on reduced respiratory reserve. Clinically, patient appears improved this AM. ABG (10/07/12) showed PH 7.461/PCO2 39.7/PO2 91.6/Bicarb 27.9. V/Q scan was negative. Managing as described below, as well as supportive treatment.  2. CHF (congestive heart failure): Patient has known hypertensive cardiomyopathy and CHF, with known EF of 45% per 2D Echocardiogram 06/2012. She has had progressive LE edema in addition to SOB, and ProBNP is elevated at 2416. CXR findings are described in #1 above. Patient appreciably responded to iv Lasix and NTG infusion in the ED. Now on iv Lasix 80 mg BID. Patient feels better today. 3. COPD (chronic obstructive pulmonary disease): Not in acute exacerbation clinically, at this time. Managing with bronchodilators.  4. CKD (chronic kidney disease), stage IV: Baseline creatinine is 2.74-3.27, from 08/2012. Creatinine at presentation was 2.76, ie, at baseline. Primary nephrologist is Dr Elvis Coil. Following renal indices. CKD may be contributory to fluid overload. Dr Malen Gauze provided  nephrology consultation.  5. DM (diabetes mellitus): This is diet-controlled type-2. Random blood glucose at presentation, was normal at 96. Managing with diet/SSI.  6. GERD (gastroesophageal reflux disease): Asymptomatic.  7. Gout: Asymptomatic.  8. HTN: BP appears reasonable at this time. Patient was on multiple antihypertensives pre-admisson, which we have continued.    Code Status: Full Code.  Family Communication:  Disposition Plan: To be determined.   Brief narrative: 72 year old female, with history of Obesity, OSA, Asthma/COPD, chronic respiratory failure, s/p Trach, on home O2 at 4L/Min, Chronic Cor Pulmonale, HTN, s/p CVA, Cardiomyopathy, CHF, EF 45% per 2D Echocardiogram 06/2012, CKD-4, Gout, Dyslipidemia, chronic anemia/Thrombocytopenia, Diet-controlled DM-2, GERD, OA, presenting with progressive shortness of breath over 2-3 days, unassociated with chest pain, fever or chills. She has had a mild cough productive of whitish phlegm, as well as progressive bilateral LE swelling. Denies abdominal pain, vomiting or diarrhea. Daughter brought her to the ED on 10/06/12, because of worsening symptoms. In the ED, she was administered iv Lasix and NTG infusion, with amelioration of symptoms. Admitted for further evaluation and management.    Consultants:  N/A.   Procedures:  CXR.   Antibiotics:  N/A.   HPI/Subjective: Better today.   Objective: Vital signs in last 24 hours: Temp:  [98.5 F (36.9 C)-100.1 F (37.8 C)] 100.1 F (37.8 C) (08/17 0405) Pulse Rate:  [62-72] 72 (08/17 0717) Resp:  [15-31] 21 (08/17 0717) BP: (140-168)/(59-95) 158/84 mmHg (08/17 0717) SpO2:  [95 %-100 %] 98 % (08/17 0717) FiO2 (%):  [28 %] 28 % (08/17 0717) Weight:  [83.2 kg (183 lb 6.8 oz)] 83.2 kg (183 lb 6.8 oz) (08/17 0405) Weight change: 2.006 kg (4 lb 6.8 oz) Last BM Date: 10/07/12  Intake/Output from previous day: 08/16 0701 - 08/17  0700 In: 470 [P.O.:470] Out: 2275 [Urine:2275]      Physical Exam: General: Alert, communicative, fully oriented, talking in complete sentences, not short of breath at rest.  HEENT: Mild clinical pallor, no jaundice, no conjunctival injection or discharge.  NECK: Supple, JVP not seen, no carotid bruits, no palpable lymphadenopathy, no palpable goiter. Patient has trach collar with Passey-Muir valve.  CHEST: Few basal crackles.  HEART: Sounds 1 and 2 heard, normal, regular, no murmurs.  ABDOMEN: Moderately obese, soft, non-tender, no palpable organomegaly, no palpable masses, normal bowel sounds.  GENITALIA: Not examined.  LOWER EXTREMITIES: Mild-moderate pitting edema, palpable peripheral pulses.  MUSCULOSKELETAL SYSTEM: Generalized osteoarthritic changes, otherwise, normal.  CENTRAL NERVOUS SYSTEM: No focal neurologic deficit on gross examination.   Lab Results:  Recent Labs  10/07/12 0600 10/08/12 0420  WBC 8.2 7.3  HGB 8.4* 8.2*  HCT 27.0* 26.4*  PLT 209 194    Recent Labs  10/07/12 0600 10/07/12 1230  NA 141 139  K 3.7 3.7  CL 104 103  CO2 28 28  GLUCOSE 107* 97  BUN 49* 46*  CREATININE 2.81* 2.73*  CALCIUM 8.9 9.0   Recent Results (from the past 240 hour(s))  MRSA PCR SCREENING     Status: None   Collection Time    10/06/12  1:09 PM      Result Value Range Status   MRSA by PCR NEGATIVE  NEGATIVE Final   Comment:            The GeneXpert MRSA Assay (FDA     approved for NASAL specimens     only), is one component of a     comprehensive MRSA colonization     surveillance program. It is not     intended to diagnose MRSA     infection nor to guide or     monitor treatment for     MRSA infections.     Studies/Results: Dg Chest 1 View  10/06/2012   *RADIOLOGY REPORT*  Clinical Data: Shortness of breath.  Fluid overload.  Current history of chronic renal insufficiency, COPD, sleep apnea, asthma, hypertension, diabetes.  Prior history of CHF.  CHEST - 1 VIEW  Comparison: Portable chest x-ray earlier same  date 0732 hours, 09/13/2012, 09/03/2012, 03/17/2012.  Findings: Lateral view of the chest read in conjunction with the AP portable chest x-ray earlier today demonstrates suboptimal inspiration with crowded bronchovascular markings in the lower lobes.  Cardiac silhouette moderately to markedly enlarged. Pulmonary venous hypertension without overt edema.  Very small bilateral pleural effusions.  Degenerative changes involving the thoracic spine.  IMPRESSION: Cardiomegaly and pulmonary venous hypertension without overt edema. Small bilateral pleural effusions.   Original Report Authenticated By: Hulan Saas, M.D.   Nm Pulmonary Perf And Vent  10/07/2012   CLINICAL DATA:  Dyspnea.  EXAM: NUCLEAR MEDICINE VENTILATION - PERFUSION LUNG SCAN  TECHNIQUE: Ventilation images were obtained in multiple projections using inhaled aerosol technetium 99 M DTPA. Perfusion images were obtained in multiple projections after intravenous injection of Tc-43m MAA.  COMPARISON:  None.  RADIOPHARMACEUTICALS:  40 mCi Tc-53m DTPA aerosol and 6 mCi Tc-12m MAA  FINDINGS: Ventilation: None diagnostic DTPA ventilation images the due to tracheostomy.  Perfusion: No wedge shaped peripheral perfusion defects to suggest acute pulmonary embolism.  IMPRESSION: No pulmonary perfusion defects to suggest acute pulmonary embolism.   Electronically Signed   By: Myles Rosenthal   On: 10/07/2012 14:22   Dg Chest Port 1 View  10/07/2012   *RADIOLOGY REPORT*  Clinical Data: Dyspnea  PORTABLE CHEST - 1 VIEW  Comparison: 10/06/2012  Findings: Cardiac enlargement stable.  Tracheostomy tube unchanged. Right lung is clear.  Cannot evaluate the retrocardiac area well on single AP view due to superimposition of cardiac silhouette.  There appears to be discoid atelectasis in the lingula.  The diaphragm is visible.  IMPRESSION: Discoid atelectasis lingula.  Cannot exclude retrocardiac opacities.  Right lung is clear.   Original Report Authenticated By: Esperanza Heir, M.D.    Medications: Scheduled Meds: . albuterol  2.5 mg Nebulization QID  . amLODipine  10 mg Oral Daily  . antiseptic oral rinse  15 mL Mouth Rinse Daily  . arformoterol  15 mcg Nebulization BID  . aspirin  81 mg Oral Daily  . atorvastatin  40 mg Oral q1800  . budesonide  0.25 mg Nebulization BID  . busPIRone  5 mg Oral BID  . carvedilol  6.25 mg Oral BID WC  . cloNIDine  0.1 mg Oral BID  . clopidogrel  75 mg Oral Q breakfast  . dextromethorphan-guaiFENesin  1 tablet Oral BID  . ferrous sulfate  325 mg Oral BID  . furosemide  80 mg Intravenous BID  . heparin  5,000 Units Subcutaneous Q8H  . hydrALAZINE  100 mg Oral TID  . insulin aspart  0-9 Units Subcutaneous TID WC  . ipratropium  0.5 mg Nebulization QID  . isosorbide mononitrate  30 mg Oral Daily  . metoCLOPramide  5 mg Oral QID  . pantoprazole  40 mg Oral Daily  . sertraline  100 mg Oral QHS  . sodium chloride  3 mL Intravenous Q12H  . vitamin C  500 mg Oral Daily   Continuous Infusions:  PRN Meds:.sodium chloride, acetaminophen, acetaminophen, albuterol, LORazepam, morphine injection, ondansetron (ZOFRAN) IV, ondansetron, oxyCODONE, sodium chloride, sodium chloride    LOS: 2 days   Nimo Verastegui,CHRISTOPHER  [facility] Pager [phone]. If 8PM-8AM, please contact night-coverage at [email or web address], password [facility] 10/08/2012, 7:53 AM  LOS: 2 days

## 2012-10-09 ENCOUNTER — Encounter (HOSPITAL_COMMUNITY): Payer: Self-pay | Admitting: Physician Assistant

## 2012-10-09 ENCOUNTER — Inpatient Hospital Stay (HOSPITAL_COMMUNITY): Payer: Medicare Other

## 2012-10-09 DIAGNOSIS — N184 Chronic kidney disease, stage 4 (severe): Secondary | ICD-10-CM

## 2012-10-09 DIAGNOSIS — I5043 Acute on chronic combined systolic (congestive) and diastolic (congestive) heart failure: Secondary | ICD-10-CM

## 2012-10-09 DIAGNOSIS — I509 Heart failure, unspecified: Secondary | ICD-10-CM

## 2012-10-09 DIAGNOSIS — I5023 Acute on chronic systolic (congestive) heart failure: Secondary | ICD-10-CM

## 2012-10-09 DIAGNOSIS — J449 Chronic obstructive pulmonary disease, unspecified: Secondary | ICD-10-CM

## 2012-10-09 LAB — RENAL FUNCTION PANEL
CO2: 30 mEq/L (ref 19–32)
Calcium: 9.2 mg/dL (ref 8.4–10.5)
GFR calc Af Amer: 18 mL/min — ABNORMAL LOW (ref >90)
GFR calc non Af Amer: 15 mL/min — ABNORMAL LOW (ref >90)
Phosphorus: 3.8 mg/dL (ref 2.3–4.6)
Potassium: 3.5 mEq/L (ref 3.5–5.1)
Sodium: 138 mEq/L (ref 135–145)

## 2012-10-09 LAB — CBC
MCHC: 31 g/dL (ref 30.0–36.0)
Platelets: 183 10*3/uL (ref 150–400)
RDW: 17.4 % — ABNORMAL HIGH (ref 11.5–15.5)
WBC: 7.5 10*3/uL (ref 4.0–10.5)

## 2012-10-09 LAB — PRO B NATRIURETIC PEPTIDE: Pro B Natriuretic peptide (BNP): 3450 pg/mL — ABNORMAL HIGH (ref 0–125)

## 2012-10-09 MED ORDER — FUROSEMIDE 80 MG PO TABS
80.0000 mg | ORAL_TABLET | Freq: Two times a day (BID) | ORAL | Status: DC
  Administered 2012-10-09 – 2012-10-11 (×4): 80 mg via ORAL
  Filled 2012-10-09 (×6): qty 1

## 2012-10-09 NOTE — Significant Event (Signed)
Have arranged for her to be seen again in our clinic w/ Dr Shelle Iron for sleep apnea and possible OHS on Sept 17th  Would recommend current plan of care at this point.   Anders Simmonds ACNP-BC [facility] Pulmonary/Critical Care Pager # [phone] OR # [phone] if no answer

## 2012-10-09 NOTE — Consult Note (Signed)
CARDIOLOGY CONSULT NOTE  Patient ID: [name], MRN: [id number], DOB/AGE: February 20, 1941 72 y.o. Admit date: 10/06/2012   Date of Consult: 10/09/2012 Primary Physician: Gwynneth Aliment, MD Primary Cardiologist: Murrell Converse, though has not been seen in office  Chief Complaint: SOB Reason for Consult: CHF  HPI: Ms. 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 More recent cardiac history includes asystolic cardiac arrest 02/2012 in the setting of a/c respiratory failure, then NSTEMI/NSVT 06/2012 (felt type II, cath deferred due to CKD, started on Plavix).  RHC 09/13/11: mild pulmonary hypertension, otherwise normal right heart pressures  2D echo 06/2012: EF 45%, severe concentric LVH, hypokinesis of inferolateral, inferior, and inferoseptal myocardium, grade 2 diastolic dysfunction, mild AS, moderately dilated LA/RA, mild-mod reduced RV systolic function, PA pressure , trivial pericardial effusion was identified posterior to the heart She presented to [facility]06/2012 with complaints of SOB. This is her 10th admission over the last 1 year. She presented this admission with progressive SOB over the past 2-3 days, mild cough with white phlegm, gradual weight gain & progressive bilateral LE edema. No CP, syncope, or bleeding. HR stable, BP mildly elevated. Initial CXR showing stable cardiac enlargement and mild chronic vascular congestion, no overt pulm edema, infiltrates or effusions -> f/u CXR this AM showing persistent decreased aeration in L base, ? atx vs infiltrate. VQ negative for PE. pBNP [id number]. Hgb 8.5 & Cr 2.85 (both relatively stable), albumin 2.6. Troponin neg x3. Was diuresed with 80mg  IV BID which has been changed to oral form today by nephrology. I/o - 7.8L. Weight with little change: 179 on adm -> 181  today. Renal has given aranesp. Tmax 100.16F yesterday. She denies fevers or chills. Her breathing has improved and LEE has gone down. She lives with her daughter who is on dialysis. Per PT, she frequently refuses physical therapy and has declined SNF. She reports med compliance.  Tele showed 17 beats SVT yesterday. She does not recall any symptoms from that time.  Wt Readings from Last 3 Encounters:  10/09/12 181 lb 3.5 oz (82.2 kg)  09/04/12 169 lb 12.1 oz (77 kg)  09/04/12 169 lb 12.1 oz (77 kg)    Past Medical History  Diagnosis Date  . COPD (chronic obstructive pulmonary disease)     on home o2  . CVA (cerebral infarction)     hx  . Hypertensive heart disease with congestive heart failure   . Chronic cor pulmonale   . Chronic kidney disease (CKD), stage IV (severe)   . Hyperlipdemia   . Obesity (BMI 30-39.9)   . OSA (obstructive sleep apnea)     Does not use CPAP  . History of gout 09/07/2011  . Chronic combined systolic and diastolic CHF (congestive heart failure) 7/13    a. With cor pulmonale - EF 45-50%.  . Anemia   . Vascular disease   . Asthma   . Hypertension   . Diabetes mellitus   . GERD (gastroesophageal reflux disease)   . Arthritis   . RBBB   . Thrombocytopenia   . Acute and chronic respiratory failure     a. Trache placed 11/2011 for recurrent intubation (hypercarbic resp failure), post-intubation vocal cord edema, several issues since that time wih trache. b. On home O2 with trache.  . Obesity, unspecified 08/28/2012  .  Obesity hypoventilation syndrome   . Lung collapse     a. Chronic LLL collapse.  . Cardiac arrest     a. Asystolic cardiac arrest 02/2012 in setting of a/c respiratory failure (ischemic w/u has been deferred due to worsening CKD).  . NSTEMI (non-ST elevated myocardial infarction)     a. 06/2012 felt type II - troponin peak 1.45 - ischemic eval has been deferred due to CKD. Started on Plavix.  . Pulmonary HTN     a. mild pulm HTN by RHC  08/2011, felt due to lung disease. b. PA pressure by echo 06/2012.  Marland Kitchen NSVT (nonsustained ventricular tachycardia)     a. Noted on tele, admission in 06/2012.  Marland Kitchen Cirrhosis     a. Suspected on abd CT 12/2011.      Most Recent Cardiac Studies: 2D Echo 07/13/12 - Left ventricle: The cavity size was mildly dilated. There was severe concentric hypertrophy. Systolic function was mildly to moderately reduced. The estimated ejection fraction was 45%. There is hypokinesis of the inferolateral, inferior, and inferoseptal myocardium. Features are consistent with a pseudonormal left ventricular filling pattern, with concomitant abnormal relaxation and increased filling pressure (grade 2 diastolic dysfunction). - Aortic valve: There was mild stenosis. Valve area: 2.08cm^2(VTI). Valve area: 2.05cm^2 (Vmax). - Mitral valve: Calcified annulus. - Left atrium: The atrium was moderately dilated. - Right ventricle: The cavity size was mildly dilated. Wall thickness was normal. Systolic function was mildly to moderately reduced. - Right atrium: The atrium was moderately dilated. - Pulmonary arteries: Systolic pressure was moderately increased. PA peak pressure: 63mm Hg (S). - Pericardium, extracardiac: A trivial pericardial effusion was identified posterior to the heart. There was a left pleural effusion.    Surgical History:  Past Surgical History  Procedure Laterality Date  . Bilateral oophorectomy    . Cardiac catheterization      right heart cath  . Laparoscopic gastrostomy  12/21/2011    Procedure: LAPAROSCOPIC GASTROSTOMY;  Surgeon: Axel Filler, MD;  Location: [facility] OR;  Service: General;  Laterality: N/A;  laparoscopic gastrostomy possible open feeding tube  . Abdominal hysterectomy    . Tracheostomy  11/2011  . Cataracts    . Tracheostomy tube placement  02/18/2012    Procedure: TRACHEOSTOMY;  Surgeon: Serena Colonel, MD;  Location: [facility] OR;  Service: ENT;  Laterality: N/A;  .  Tracheostomy revision N/A 09/01/2012    Procedure: TRACHEOSTOMY REVISION;  Surgeon: Serena Colonel, MD;  Location: [facility] OR;  Service: ENT;  Laterality: N/A;     Home Meds: Prior to Admission medications   Medication Sig Start Date End Date Taking? Authorizing Provider  albuterol (PROVENTIL) (5 MG/ML) 0.5% nebulizer solution Take 0.5 mLs (2.5 mg total) by nebulization 4 (four) times daily. 09/14/12  Yes Christiane Ha, MD  amLODipine (NORVASC) 10 MG tablet Take 10 mg by mouth daily.   Yes Historical Provider, MD  arformoterol (BROVANA) 15 MCG/2ML NEBU Take 2 mLs (15 mcg total) by nebulization 2 (two) times daily. 07/16/12  Yes Renae Fickle, MD  Ascorbic Acid (VITAMIN C PO) Take 1 tablet by mouth daily.   Yes Historical Provider, MD  aspirin 81 MG chewable tablet Chew 1 tablet (81 mg total) by mouth daily. 02/19/12  Yes Simonne Martinet, NP  atorvastatin (LIPITOR) 40 MG tablet Take 1 tablet (40 mg total) by mouth daily. 07/16/12  Yes Renae Fickle, MD  budesonide (PULMICORT) 0.25 MG/2ML nebulizer solution Take 2 mLs (0.25 mg total) by nebulization 2 (two) times daily. 07/16/12  Yes Renae Fickle, MD  busPIRone (BUSPAR) 5 MG tablet Take 5 mg by mouth 2 (two) times daily.   Yes Historical Provider, MD  carvedilol (COREG) 6.25 MG tablet Take 1 tablet (6.25 mg total) by mouth 2 (two) times daily with a meal. 05/19/12  Yes Estela Isaiah Blakes, MD  cloNIDine (CATAPRES) 0.2 MG tablet Take 0.5 tablets (0.1 mg total) by mouth 2 (two) times daily. 07/16/12  Yes Renae Fickle, MD  clopidogrel (PLAVIX) 75 MG tablet Take 1 tablet (75 mg total) by mouth daily with breakfast. 07/16/12  Yes Renae Fickle, MD  dextromethorphan-guaiFENesin [facility] DM) 30-600 MG per 12 hr tablet Take 1 tablet by mouth 2 (two) times daily. 07/16/12  Yes Renae Fickle, MD  ferrous sulfate 325 (65 FE) MG EC tablet Take 1 tablet (325 mg total) by mouth 2 (two) times daily. 07/16/12  Yes Renae Fickle, MD  hydrALAZINE  (APRESOLINE) 100 MG tablet Take 100 mg by mouth 3 (three) times daily.   Yes Historical Provider, MD  ipratropium (ATROVENT) 0.02 % nebulizer solution Take 2.5 mLs (0.5 mg total) by nebulization 4 (four) times daily. 09/14/12  Yes Christiane Ha, MD  isosorbide mononitrate (IMDUR) 30 MG 24 hr tablet Take 1 tablet (30 mg total) by mouth daily. 05/19/12  Yes Estela Isaiah Blakes, MD  LORazepam (ATIVAN) 0.5 MG tablet Take 1 tablet (0.5 mg total) by mouth every 6 (six) hours as needed for anxiety. 09/14/12  Yes Christiane Ha, MD  metoCLOPramide (REGLAN) 5 MG tablet Take 5 mg by mouth 4 (four) times daily.   Yes Historical Provider, MD  oxycodone (OXY-IR) 5 MG capsule Take 5 mg by mouth every 6 (six) hours as needed (for moderate pain).   Yes Historical Provider, MD  pantoprazole (PROTONIX) 20 MG tablet Take 40 mg by mouth daily.   Yes Historical Provider, MD  sertraline (ZOLOFT) 100 MG tablet Take 100 mg by mouth at bedtime.   Yes Historical Provider, MD  torsemide (DEMADEX) 20 MG tablet Take 20 mg by mouth daily.   Yes Historical Provider, MD  Artificial Saliva (BIOTENE MOISTURIZING MOUTH) SOLN Use as directed 1 spray in the mouth or throat daily.    Historical Provider, MD    Inpatient Medications:  . albuterol  2.5 mg Nebulization QID  . amLODipine  10 mg Oral Daily  . antiseptic oral rinse  15 mL Mouth Rinse Daily  . arformoterol  15 mcg Nebulization BID  . aspirin  81 mg Oral Daily  . atorvastatin  40 mg Oral q1800  . budesonide  0.25 mg Nebulization BID  . busPIRone  5 mg Oral BID  . carvedilol  6.25 mg Oral BID WC  . cloNIDine  0.1 mg Oral BID  . clopidogrel  75 mg Oral Q breakfast  . dextromethorphan-guaiFENesin  1 tablet Oral BID  . ferrous sulfate  325 mg Oral BID  . furosemide  80 mg Oral BID  . heparin  5,000 Units Subcutaneous Q8H  . hydrALAZINE  100 mg Oral TID  . insulin aspart  0-9 Units Subcutaneous TID WC  . ipratropium  0.5 mg Nebulization QID  . isosorbide  mononitrate  30 mg Oral Daily  . metoCLOPramide  5 mg Oral QID  . pantoprazole  40 mg Oral Daily  . sertraline  100 mg Oral QHS  . sodium chloride  3 mL Intravenous Q12H  . vitamin C  500 mg Oral Daily      Allergies:  Allergies  Allergen Reactions  . Other Itching    "Wool"  . Sulfonamide Derivatives Hives and Rash    History   Social History  . Marital Status: Widowed    Spouse Name: N/A    Number of Children: 1  . Years of Education: N/A   Occupational History  . Not on file.   Social History Main Topics  . Smoking status: Former Smoker -- 1.00 packs/day for 55 years    Types: Cigarettes    Quit date: 08/28/2011  . Smokeless tobacco: Never Used  . Alcohol Use: No  . Drug Use: No  . Sexual Activity: No   Other Topics Concern  . Not on file   Social History Narrative   Widow,  lives alone     Family History  Problem Relation Age of Onset  . Heart attack Father   . Stroke Mother     CVA  . Coronary artery disease Daughter      Review of Systems: General: negative for chills, fever, night sweats  cardiovascular: see above Dermatological: negative for rash Respiratory: see above Urologic: negative for hematuria Abdominal: negative for nausea, vomiting, diarrhea, bright red blood per rectum, melena, or hematemesis Neurologic: negative for visual changes, syncope, or dizziness All other systems reviewed and are otherwise negative except as noted above.  Labs:  Recent Labs  10/06/12 2300  TROPONINI <0.30   Lab Results  Component Value Date   WBC 7.5 10/09/2012   HGB 8.5* 10/09/2012   HCT 27.4* 10/09/2012   MCV 81.8 10/09/2012   PLT 183 10/09/2012    Recent Labs Lab 10/07/12 0600  10/09/12 0330  NA 141  < > 138  K 3.7  < > 3.5  CL 104  < > 99  CO2 28  < > 30  BUN 49*  < > 43*  CREATININE 2.81*  < > 2.85*  CALCIUM 8.9  < > 9.2  PROT 5.6*  --   --   BILITOT 0.2*  --   --   ALKPHOS 63  --   --   ALT 6  --   --   AST 11  --   --   GLUCOSE  107*  < > 97  < > = values in this interval not displayed. Lab Results  Component Value Date   CHOL 124 12/29/2011   HDL 30* 12/29/2011   LDLCALC 68 12/29/2011   TRIG 129 12/29/2011   Lab Results  Component Value Date   DDIMER 1.16* 04/28/2012    Radiology/Studies:  Dg Chest 1 View  10/06/2012   *RADIOLOGY REPORT*  Clinical Data: Shortness of breath.  Fluid overload.  Current history of chronic renal insufficiency, COPD, sleep apnea, asthma, hypertension, diabetes.  Prior history of CHF.  CHEST - 1 VIEW  Comparison: Portable chest x-ray earlier same date 0732 hours, 09/13/2012, 09/03/2012, 03/17/2012.  Findings: Lateral view of the chest read in conjunction with the AP portable chest x-ray earlier today demonstrates suboptimal inspiration with crowded bronchovascular markings in the lower lobes.  Cardiac silhouette moderately to markedly enlarged. Pulmonary venous hypertension without overt edema.  Very small bilateral pleural effusions.  Degenerative changes involving the thoracic spine.  IMPRESSION: Cardiomegaly and pulmonary venous hypertension without overt edema. Small bilateral pleural effusions.   Original Report Authenticated By: Hulan Saas, M.D.   Nm Pulmonary Perf And Vent  10/07/2012   CLINICAL DATA:  Dyspnea.  EXAM: NUCLEAR MEDICINE VENTILATION - PERFUSION LUNG SCAN  TECHNIQUE: Ventilation images were obtained  in multiple projections using inhaled aerosol technetium 99 M DTPA. Perfusion images were obtained in multiple projections after intravenous injection of Tc-76m MAA.  COMPARISON:  None.  RADIOPHARMACEUTICALS:  40 mCi Tc-22m DTPA aerosol and 6 mCi Tc-60m MAA  FINDINGS: Ventilation: None diagnostic DTPA ventilation images the due to tracheostomy.  Perfusion: No wedge shaped peripheral perfusion defects to suggest acute pulmonary embolism.  IMPRESSION: No pulmonary perfusion defects to suggest acute pulmonary embolism.   Electronically Signed   By: Myles Rosenthal   On: 10/07/2012 14:22     Dg Chest Port 1 View  10/09/2012   *RADIOLOGY REPORT*  Clinical Data: Congestive heart failure.  PORTABLE CHEST - 1 VIEW  Comparison: 10/07/2012  Findings: Tracheostomy tube is midline with tip above the carina. The heart size appears moderately enlarged.  There are low lung volumes.  Decreased aeration to the left lung base is again noted and appears unchanged from previous exam.  Right lung appears clear.  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  IMPRESSION:  1.  Persistent decreased aeration to the left base which may represent atelectasis and/or infiltrate.   Original Report Authenticated By: Signa Kell, M.D.   EKG:NSR 63bpm, RBBB, QTc 504, nonspecific ST-T changes atypical  Physical Exam:  Blood pressure 143/57, pulse 63, temperature 98.9 F (37.2 C), temperature source Axillary, resp. rate 22, height 5\' 4"  (1.626 m), weight 181 lb 3.5 oz (82.2 kg), SpO2 98.00%. General  Chronically ill appearing AAF in no acute distress. Head: Normocephalic, atraumatic, sclera non-icteric, no xanthomas, nares are without discharge.  Neck:  Unable to assess JVD with trach currently in place Lungs: Diminished BS throughout. Trache in place. Breathing is unlabored. Heart: RRR with S1 S2. No murmurs, rubs, or gallops appreciated. Abdomen: Soft, non-tender, non-distended with normoactive bowel sounds. No hepatomegaly. No rebound/guarding. No obvious abdominal masses. Msk:  Strength and tone appear normal for age. Extremities: No clubbing or cyanosis. Tr edema with striations to appear as if there were once edema bilaterally, but it has improved.  Distal pedal pulses are 2+ and equal bilaterally. Neuro: Alert and oriented X 3. No facial asymmetry. No focal deficit. Moves all extremities spontaneously. Psych:  Responds to questions appropriately with a rather flat affect.   Assessment and Plan:   1. Acute on chronic respiratory failure with trach 2. COPD with transient fever last  night 3. Acute on chronic combined systolic and diastolic CHF with cor pulmonale 4. Stage IV chronic kidney disease 5. OHS/OSA 6. Pulm HTN PA pressure 06/2012 7. Chronic LLL collapse 8. HTN 9. Diabetes mellitus, diet controlled 10. H/o NSVT, quiescent 11. SVT, nonsustained 12. Chronic anemia  The patient's volume status is improving but she still is volume overloaded. Nephrology is currently driving the management of diuretic therapy. She is diuresing   Would continue.   She is on a good regimen of beta blocker, imdur/hydralazine. Avoid ACEI/ARB/spironolactone at present given renal failure.  Continue to follow on telemetry.  If patient is agreeable closer to discharge, may be a candidate for the [facility] Care Management team given recurrent admissions. We will place a consult. It will be important for her to keep close outpatient followup. She has not been agreeable to SNF in the past.   Signed, Ronie Spies PA-C 10/09/2012, 3:18 PM  Patient seen and examined.  I have amended above consult note to reflect my findings.  Would continue diuresis WIll follow with you.    Dietrich Pates 10/09/2012

## 2012-10-09 NOTE — Progress Notes (Signed)
[facility] PROGRESS NOTE  [name] NWG:[id number] DOB: 1940-06-23 DOA: 10/06/2012 PCP: Gwynneth Aliment, MD  Assessment/Plan: Active Problems:   Dyspnea   Respiratory failure, acute and chronic   CHF (congestive heart failure)   COPD (chronic obstructive pulmonary disease)   CKD (chronic kidney disease), stage IV   DM (diabetes mellitus)   GERD (gastroesophageal reflux disease)    1. Dyspnea/Respiratory failure, acute and chronic: Patient presented with progressive SOB, associated with increasing bilateral LE edema, in the setting of O2-dependent COPD/OSA and chronic respiratory failure. She had no fever or chills, cough was productive of whitish phlegm, and wcc was normal. CXR revealed cardiomegaly and pulmonary venous hypertension without overt edema.  Small bilateral pleural effusions. Clinically, the etiology is acute decompensation of systolic CHF, superimposed on reduced respiratory reserve. Clinically, patient appears improved this AM. ABG (10/07/12) showed PH 7.461/PCO2 39.7/PO2 91.6/Bicarb 27.9. V/Q scan was negative. Managing as described below, as well as supportive treatment. Have arranged pulmonary clinic follow up, on discharge.  2. CHF (congestive heart failure): Patient has known hypertensive cardiomyopathy and CHF, with known EF of 45% per 2D Echocardiogram 06/2012. She has had progressive LE edema in addition to SOB, and ProBNP is elevated at 2416. CXR findings are described in #1 above. Patient appreciably responded to iv Lasix and NTG infusion in the ED. Now on iv Lasix 80 mg BID and appears to be improving gradually. Have consulted [place] [facility]. 3. COPD (chronic obstructive pulmonary disease): Not in acute exacerbation clinically, at this time. Managing with bronchodilators.  4. CKD (chronic kidney disease), stage IV: Baseline creatinine is 2.74-3.27, from 08/2012. Creatinine at presentation was 2.76, ie, at baseline. Primary nephrologist is Dr Elvis Coil. Following renal indices. CKD may be contributory to fluid overload. Dr Malen Gauze provided nephrology consultation, and we are managing as recommended.  5. DM (diabetes mellitus): This is diet-controlled type-2. Random blood glucose at presentation, was normal at 96. Managing with diet/SSI.  6. GERD (gastroesophageal reflux disease): Asymptomatic.  7. Gout: Asymptomatic.  8. HTN: BP appears reasonable at this time. Patient was on multiple antihypertensives pre-admisson, which we have continued.    Code Status: Full Code.  Family Communication:  Disposition Plan: To be determined.   Brief narrative: 72 year old female, with history of Obesity, OSA, Asthma/COPD, chronic respiratory failure, s/p Trach, on home O2 at 4L/Min, Chronic Cor Pulmonale, HTN, s/p CVA, Cardiomyopathy, CHF, EF 45% per 2D Echocardiogram 06/2012, CKD-4, Gout, Dyslipidemia, chronic anemia/Thrombocytopenia, Diet-controlled DM-2, GERD, OA, presenting with progressive shortness of breath over 2-3 days, unassociated with chest pain, fever or chills. She has had a mild cough productive of whitish phlegm, as well as progressive bilateral LE swelling. Denies abdominal pain, vomiting or diarrhea. Daughter brought her to the ED on 10/06/12, because of worsening symptoms. In the ED, she was administered iv Lasix and NTG infusion, with amelioration of symptoms. Admitted for further evaluation and management.    Consultants:  N/A.   Procedures:  CXR.   Antibiotics:  N/A.   HPI/Subjective: No new issues. Feels better.   Objective: Vital signs in last 24 hours: Temp:  [98.2 F (36.8 C)-99.2 F (37.3 C)] 99.2 F (37.3 C) (08/18 0751) Pulse Rate:  [60-77] 67 (08/18 0844) Resp:  [11-30] 20 (08/18 0844) BP: (129-167)/(55-80) 158/80 mmHg (08/18 0844) SpO2:  [96 %-100 %] 98 % (08/18 0801) FiO2 (%):  [28 %] 28 % (08/18 0801) Weight:  [82.2 kg (181 lb 3.5 oz)] 82.2 kg (181 lb 3.5 oz) (  08/18 0600) Weight change: -1 kg (-2  lb 3.3 oz) Last BM Date: 10/08/12  Intake/Output from previous day: 08/17 0701 - 08/18 0700 In: 350 [P.O.:350] Out: 3275 [Urine:3275] Total I/O In: -  Out: 100 [Urine:100]   Physical Exam: General: Alert, communicative, fully oriented, talking in complete sentences, not short of breath at rest.  HEENT: Mild clinical pallor, no jaundice, no conjunctival injection or discharge.  NECK: Supple, JVP not seen, no carotid bruits, no palpable lymphadenopathy, no palpable goiter. Patient has trach collar with Passey-Muir valve.  CHEST: No crackles today. Few expiratory rhonchi.  HEART: Sounds 1 and 2 heard, normal, regular, no murmurs.  ABDOMEN: Moderately obese, soft, non-tender, no palpable organomegaly, no palpable masses, normal bowel sounds.  GENITALIA: Not examined.  LOWER EXTREMITIES: Mild pitting edema, palpable peripheral pulses.  MUSCULOSKELETAL SYSTEM: Generalized osteoarthritic changes, otherwise, normal.  CENTRAL NERVOUS SYSTEM: No focal neurologic deficit on gross examination.   Lab Results:  Recent Labs  10/08/12 0850 10/09/12 0330  WBC 7.7 7.5  HGB 9.1* 8.5*  HCT 28.3* 27.4*  PLT 168 183    Recent Labs  10/08/12 0850 10/09/12 0330  NA 138 138  K 3.6 3.5  CL 101 99  CO2 29 30  GLUCOSE 101* 97  BUN 43* 43*  CREATININE 2.75* 2.85*  CALCIUM 9.5 9.2   Recent Results (from the past 240 hour(s))  MRSA PCR SCREENING     Status: None   Collection Time    10/06/12  1:09 PM      Result Value Range Status   MRSA by PCR NEGATIVE  NEGATIVE Final   Comment:            The GeneXpert MRSA Assay (FDA     approved for NASAL specimens     only), is one component of a     comprehensive MRSA colonization     surveillance program. It is not     intended to diagnose MRSA     infection nor to guide or     monitor treatment for     MRSA infections.     Studies/Results: Nm Pulmonary Perf And Vent  10/07/2012   CLINICAL DATA:  Dyspnea.  EXAM: NUCLEAR MEDICINE  VENTILATION - PERFUSION LUNG SCAN  TECHNIQUE: Ventilation images were obtained in multiple projections using inhaled aerosol technetium 99 M DTPA. Perfusion images were obtained in multiple projections after intravenous injection of Tc-94m MAA.  COMPARISON:  None.  RADIOPHARMACEUTICALS:  40 mCi Tc-48m DTPA aerosol and 6 mCi Tc-19m MAA  FINDINGS: Ventilation: None diagnostic DTPA ventilation images the due to tracheostomy.  Perfusion: No wedge shaped peripheral perfusion defects to suggest acute pulmonary embolism.  IMPRESSION: No pulmonary perfusion defects to suggest acute pulmonary embolism.   Electronically Signed   By: Myles Rosenthal   On: 10/07/2012 14:22   Dg Chest Port 1 View  10/07/2012   *RADIOLOGY REPORT*  Clinical Data: Dyspnea  PORTABLE CHEST - 1 VIEW  Comparison: 10/06/2012  Findings: Cardiac enlargement stable.  Tracheostomy tube unchanged. Right lung is clear.  Cannot evaluate the retrocardiac area well on single AP view due to superimposition of cardiac silhouette.  There appears to be discoid atelectasis in the lingula.  The diaphragm is visible.  IMPRESSION: Discoid atelectasis lingula.  Cannot exclude retrocardiac opacities.  Right lung is clear.   Original Report Authenticated By: Esperanza Heir, M.D.    Medications: Scheduled Meds: . albuterol  2.5 mg Nebulization QID  . amLODipine  10 mg Oral Daily  .  antiseptic oral rinse  15 mL Mouth Rinse Daily  . arformoterol  15 mcg Nebulization BID  . aspirin  81 mg Oral Daily  . atorvastatin  40 mg Oral q1800  . budesonide  0.25 mg Nebulization BID  . busPIRone  5 mg Oral BID  . carvedilol  6.25 mg Oral BID WC  . cloNIDine  0.1 mg Oral BID  . clopidogrel  75 mg Oral Q breakfast  . dextromethorphan-guaiFENesin  1 tablet Oral BID  . ferrous sulfate  325 mg Oral BID  . furosemide  80 mg Intravenous BID  . heparin  5,000 Units Subcutaneous Q8H  . hydrALAZINE  100 mg Oral TID  . insulin aspart  0-9 Units Subcutaneous TID WC  .  ipratropium  0.5 mg Nebulization QID  . isosorbide mononitrate  30 mg Oral Daily  . metoCLOPramide  5 mg Oral QID  . pantoprazole  40 mg Oral Daily  . sertraline  100 mg Oral QHS  . sodium chloride  3 mL Intravenous Q12H  . vitamin C  500 mg Oral Daily   Continuous Infusions:  PRN Meds:.sodium chloride, acetaminophen, acetaminophen, albuterol, LORazepam, morphine injection, ondansetron (ZOFRAN) IV, ondansetron, oxyCODONE, sodium chloride, sodium chloride    LOS: 3 days   Ricke Kimoto,CHRISTOPHER  [facility] Pager [phone]. If 8PM-8AM, please contact night-coverage at [email or web address], password [facility] 10/09/2012, 10:15 AM  LOS: 3 days

## 2012-10-09 NOTE — Progress Notes (Signed)
I have seen and examined this patient and agree with plan as outlined by Dr. Virgina Organ.  Pt is now 8L negative since admission.  Appears euvolemic on exam.  Would change to po lasix 80mg  bid and follow.  Near baseline Scr. No indication for HD. Keshonda Monsour A,MD 10/09/2012 11:16 AM

## 2012-10-09 NOTE — Progress Notes (Signed)
[facility] ROUNDING NOTE   Subjective:   [name] was seen and examined at bedside this morning.  She reports feeling better but still has audible wheezing.  She denies any chest pain, N/V/D, abdominal pain, fever, or chills at this time.   Objective:  Vital signs in last 24 hours:  Temp:  [97.9 F (36.6 C)-99 F (37.2 C)] 99 F (37.2 C) (08/18 0439) Pulse Rate:  [60-77] 63 (08/18 0439) Resp:  [20-30] 20 (08/18 0439) BP: (129-163)/(55-78) 149/57 mmHg (08/18 0439) SpO2:  [96 %-100 %] 97 % (08/18 0439) FiO2 (%):  [28 %] 28 % (08/18 0419) Weight:  [181 lb 3.5 oz (82.2 kg)] 181 lb 3.5 oz (82.2 kg) (08/18 0600)  Weight change: -2 lb 3.3 oz (-1 kg) Filed Weights   10/07/12 0500 10/08/12 0405 10/09/12 0600  Weight: 183 lb 3.2 oz (83.1 kg) 183 lb 6.8 oz (83.2 kg) 181 lb 3.5 oz (82.2 kg)   Intake/Output: I/O last 3 completed shifts: In: 350 [P.O.:350] Out: 4525 [Urine:4525]  Physical Exam:  Vitals reviewed.  General:lying in bed, trach in place HEENT: PERRL, EOMI  Cardiac: RRR  Pulm: diffuse inspiratory wheezing and coarse breath sounds b/l  Abd: soft, obese, nontender, nondistended, BS present  Ext: warm and well perfused, mild lower extremity edema  Neuro: alert, answering some questions, moving all 4 extremities  Basic Metabolic Panel:  Recent Labs Lab 10/06/12 0715 10/06/12 1058 10/07/12 0600 10/07/12 1230 10/08/12 0850 10/09/12 0330  NA 142  --  141 139 138 138  K 3.6  --  3.7 3.7 3.6 3.5  CL 106  --  104 103 101 99  CO2 21  --  28 28 29 30   GLUCOSE 96  --  107* 97 101* 97  BUN 45*  --  49* 46* 43* 43*  CREATININE 2.76* 2.81* 2.81* 2.73* 2.75* 2.85*  CALCIUM 9.5  --  8.9 9.0 9.5 9.2  MG  --   --   --   --  1.9 2.0  PHOS  --   --   --  3.3 3.4 3.8   Liver Function Tests:  Recent Labs Lab 10/06/12 0715 10/07/12 0600 10/07/12 1230 10/08/12 0850 10/09/12 0330  AST 15 11  --   --   --   ALT 8 6  --   --   --   ALKPHOS 69 63  --   --    --   BILITOT 0.3 0.2*  --   --   --   PROT 6.1 5.6*  --   --   --   ALBUMIN 3.0* 2.8* 2.6* 2.8* 2.6*   CBC:  Recent Labs Lab 10/06/12 1058 10/07/12 0600 10/08/12 0420 10/08/12 0850 10/09/12 0330  WBC 5.9 8.2 7.3 7.7 7.5  HGB 8.8* 8.4* 8.2* 9.1* 8.5*  HCT 27.8* 27.0* 26.4* 28.3* 27.4*  MCV 81.8 81.6 81.5 80.4 81.8  PLT 188 209 194 168 183   Cardiac Enzymes:  Recent Labs Lab 10/06/12 0830 10/06/12 1432 10/06/12 2300  TROPONINI <0.30 <0.30 <0.30   BNP    Component Value Date/Time   PROBNP 3856.0* 10/08/2012 0420   CBG:  Recent Labs Lab 10/07/12 1938 10/08/12 0744 10/08/12 1242 10/08/12 1746 10/08/12 2347  GLUCAP 124* 128* 88 98 97   Microbiology: Results for orders placed during the hospital encounter of 10/06/12  MRSA PCR SCREENING     Status: None   Collection Time    10/06/12  1:09 PM  Result Value Range Status   MRSA by PCR NEGATIVE  NEGATIVE Final   Comment:            The GeneXpert MRSA Assay (FDA     approved for NASAL specimens     only), is one component of a     comprehensive MRSA colonization     surveillance program. It is not     intended to diagnose MRSA     infection nor to guide or     monitor treatment for     MRSA infections.   Urinalysis: No results found for this basename: COLORURINE, APPERANCEUR, LABSPEC, PHURINE, GLUCOSEU, HGBUR, BILIRUBINUR, KETONESUR, PROTEINUR, UROBILINOGEN, NITRITE, LEUKOCYTESUR,  in the last 72 hours  Imaging: Nm Pulmonary Perf And Vent  10/07/2012   CLINICAL DATA:  Dyspnea.  EXAM: NUCLEAR MEDICINE VENTILATION - PERFUSION LUNG SCAN  TECHNIQUE: Ventilation images were obtained in multiple projections using inhaled aerosol technetium 99 M DTPA. Perfusion images were obtained in multiple projections after intravenous injection of Tc-87m MAA.  COMPARISON:  None.  RADIOPHARMACEUTICALS:  40 mCi Tc-37m DTPA aerosol and 6 mCi Tc-32m MAA  FINDINGS: Ventilation: None diagnostic DTPA ventilation images the due to  tracheostomy.  Perfusion: No wedge shaped peripheral perfusion defects to suggest acute pulmonary embolism.  IMPRESSION: No pulmonary perfusion defects to suggest acute pulmonary embolism.   Electronically Signed   By: Myles Rosenthal   On: 10/07/2012 14:22   Dg Chest Port 1 View  10/07/2012   *RADIOLOGY REPORT*  Clinical Data: Dyspnea  PORTABLE CHEST - 1 VIEW  Comparison: 10/06/2012  Findings: Cardiac enlargement stable.  Tracheostomy tube unchanged. Right lung is clear.  Cannot evaluate the retrocardiac area well on single AP view due to superimposition of cardiac silhouette.  There appears to be discoid atelectasis in the lingula.  The diaphragm is visible.  IMPRESSION: Discoid atelectasis lingula.  Cannot exclude retrocardiac opacities.  Right lung is clear.   Original Report Authenticated By: Esperanza Heir, M.D.    Medications:     . albuterol  2.5 mg Nebulization QID  . amLODipine  10 mg Oral Daily  . antiseptic oral rinse  15 mL Mouth Rinse Daily  . arformoterol  15 mcg Nebulization BID  . aspirin  81 mg Oral Daily  . atorvastatin  40 mg Oral q1800  . budesonide  0.25 mg Nebulization BID  . busPIRone  5 mg Oral BID  . carvedilol  6.25 mg Oral BID WC  . cloNIDine  0.1 mg Oral BID  . clopidogrel  75 mg Oral Q breakfast  . dextromethorphan-guaiFENesin  1 tablet Oral BID  . ferrous sulfate  325 mg Oral BID  . furosemide  80 mg Intravenous BID  . heparin  5,000 Units Subcutaneous Q8H  . hydrALAZINE  100 mg Oral TID  . insulin aspart  0-9 Units Subcutaneous TID WC  . ipratropium  0.5 mg Nebulization QID  . isosorbide mononitrate  30 mg Oral Daily  . metoCLOPramide  5 mg Oral QID  . pantoprazole  40 mg Oral Daily  . sertraline  100 mg Oral QHS  . sodium chloride  3 mL Intravenous Q12H  . vitamin C  500 mg Oral Daily   sodium chloride, acetaminophen, acetaminophen, albuterol, LORazepam, morphine injection, ondansetron (ZOFRAN) IV, ondansetron, oxyCODONE, sodium chloride, sodium  chloride  Assessment/ Plan:  [name] is a 72 y.o. female with extensive PMHx significant for CKD stage 4 (follow with Dr. Hyman Hopes), CHF EF 45% with hypokinesis and grade  2 diastolic dysfunction (echo 06/2012), and COPD/asthma with OSA and trach and oxygen dependent admitted to [facility] on 10/06/2012 for acute on chronic respiratory failure.   CKD stage 4--Cr on admission 2.76 (around baseline) and follows with Dr. Hyman Hopes. Hx of HTN and baseline Cr appears to be 2.5-2.8. Urine protein to creatinine ration 3.68. Currently on lasix.  -trend renal function  -continue diuresis -daily weights, down 2 lbs today -strict I/Os: -7.8 L since admission  Acute on chronic respiratory failure--trach dependent with hx of OSA, COPD/asthma, and o2 dependent on 4L [place] at home. ?secondary to acute decompensated CHF. Wheezing on physical exam. No clear infiltrate on cxr. VQ scan not suggestive of acute PE.  -management per primary team   Anemia of chronic disease in setting of CKD stage 4: Hb 8.8 on admission.  Iron panel 08/2012: Iron 164, Ferritin 35, TIBC 225, Folate 10.6, sat ratios 73%.  Received Aranesp x1 yesterday.    -trend Hb    CHF--EF 45% with hypokinesis and grade 2 diastolic dysfunction per echo 03/4399. Presenting with worsening SOB and lower extremity edema. Weight appears slightly up from baseline, to 183lb today and was 179lb on admission. Home regimen includes: norvasc, asa, lipitor, buspar, coreg, clonidine, plavix, hydralazine, IMDUR, and torsemide. On IV lasix at this time.  -management per primary team   Case discussed and patient seen with Dr. Arrie Aran   LOS: 3 Signed: Darden Palmer, MD PGY-2, Internal Medicine Resident Pager: [phone]  10/09/2012,11:04 AM

## 2012-10-10 DIAGNOSIS — N179 Acute kidney failure, unspecified: Secondary | ICD-10-CM

## 2012-10-10 DIAGNOSIS — J9620 Acute and chronic respiratory failure, unspecified whether with hypoxia or hypercapnia: Secondary | ICD-10-CM

## 2012-10-10 DIAGNOSIS — N189 Chronic kidney disease, unspecified: Secondary | ICD-10-CM

## 2012-10-10 LAB — GLUCOSE, CAPILLARY
Glucose-Capillary: 131 mg/dL — ABNORMAL HIGH (ref 70–99)
Glucose-Capillary: 120 mg/dL — ABNORMAL HIGH (ref 70–99)
Glucose-Capillary: 177 mg/dL — ABNORMAL HIGH (ref 70–99)
Glucose-Capillary: 155 mg/dL — ABNORMAL HIGH (ref 70–99)
Glucose-Capillary: 148 mg/dL — ABNORMAL HIGH (ref 70–99)

## 2012-10-10 LAB — RENAL FUNCTION PANEL
CO2: 30 mEq/L (ref 19–32)
Chloride: 98 mEq/L (ref 96–112)
GFR calc Af Amer: 16 mL/min — ABNORMAL LOW (ref >90)
Glucose, Bld: 108 mg/dL — ABNORMAL HIGH (ref 70–99)
Potassium: 3.7 mEq/L (ref 3.5–5.1)
Sodium: 137 mEq/L (ref 135–145)

## 2012-10-10 LAB — CBC
HCT: 27.4 % — ABNORMAL LOW (ref 36.0–46.0)
Hemoglobin: 8.6 g/dL — ABNORMAL LOW (ref 12.0–15.0)
WBC: 9.2 10*3/uL (ref 4.0–10.5)

## 2012-10-10 MED ORDER — ISOSORBIDE MONONITRATE ER 60 MG PO TB24
60.0000 mg | ORAL_TABLET | Freq: Every day | ORAL | Status: DC
  Administered 2012-10-10 – 2012-10-20 (×11): 60 mg via ORAL
  Filled 2012-10-10 (×11): qty 1

## 2012-10-10 MED ORDER — CARVEDILOL 12.5 MG PO TABS
12.5000 mg | ORAL_TABLET | Freq: Two times a day (BID) | ORAL | Status: DC
  Administered 2012-10-10 – 2012-10-17 (×16): 12.5 mg via ORAL
  Filled 2012-10-10 (×19): qty 1

## 2012-10-10 NOTE — Progress Notes (Signed)
Patient ID: [name], female   DOB: [birth year], 72 y.o.   MRN: [id number]    SUBJECTIVE: Breathing better than at admission but still a "little heavy."  No chest pain.   Marland Kitchen albuterol  2.5 mg Nebulization QID  . amLODipine  10 mg Oral Daily  . antiseptic oral rinse  15 mL Mouth Rinse Daily  . arformoterol  15 mcg Nebulization BID  . aspirin  81 mg Oral Daily  . atorvastatin  40 mg Oral q1800  . budesonide  0.25 mg Nebulization BID  . busPIRone  5 mg Oral BID  . carvedilol  12.5 mg Oral BID WC  . cloNIDine  0.1 mg Oral BID  . clopidogrel  75 mg Oral Q breakfast  . dextromethorphan-guaiFENesin  1 tablet Oral BID  . ferrous sulfate  325 mg Oral BID  . furosemide  80 mg Oral BID  . heparin  5,000 Units Subcutaneous Q8H  . hydrALAZINE  100 mg Oral TID  . insulin aspart  0-9 Units Subcutaneous TID WC  . ipratropium  0.5 mg Nebulization QID  . isosorbide mononitrate  60 mg Oral Daily  . metoCLOPramide  5 mg Oral QID  . pantoprazole  40 mg Oral Daily  . sertraline  100 mg Oral QHS  . sodium chloride  3 mL Intravenous Q12H  . vitamin C  500 mg Oral Daily      Filed Vitals:   10/10/12 0317 10/10/12 0418 10/10/12 0500 10/10/12 0746  BP: 146/58 143/62  169/75  Pulse: 62 57  66  Temp:  97.9 F (36.6 C)  98.1 F (36.7 C)  TempSrc:  Oral  Oral  Resp: 20 17  22   Height:      Weight:  82.2 kg (181 lb 3.5 oz) 81.6 kg (179 lb 14.3 oz)   SpO2: 97% 99%  98%    Intake/Output Summary (Last 24 hours) at 10/10/12 0749 Last data filed at 10/10/12 0418  Gross per 24 hour  Intake    726 ml  Output   2600 ml  Net  -1874 ml    LABS: Basic Metabolic Panel:  Recent Labs  56/21/30 0330 10/10/12 0600  NA 138 137  K 3.5 3.7  CL 99 98  CO2 30 30  GLUCOSE 97 108*  BUN 43* 48*  CREATININE 2.85* 3.06*  CALCIUM 9.2 9.1  MG 2.0 1.9  PHOS 3.8 3.8   Liver Function Tests:  Recent Labs  10/09/12 0330 10/10/12 0600  ALBUMIN 2.6* 2.6*   No results found for this basename:  LIPASE, AMYLASE,  in the last 72 hours CBC:  Recent Labs  10/09/12 0330 10/10/12 0600  WBC 7.5 9.2  HGB 8.5* 8.6*  HCT 27.4* 27.4*  MCV 81.8 80.8  PLT 183 186   Cardiac Enzymes: No results found for this basename: CKTOTAL, CKMB, CKMBINDEX, TROPONINI,  in the last 72 hours BNP: No components found with this basename: POCBNP,  D-Dimer: No results found for this basename: DDIMER,  in the last 72 hours Hemoglobin A1C: No results found for this basename: HGBA1C,  in the last 72 hours Fasting Lipid Panel: No results found for this basename: CHOL, HDL, LDLCALC, TRIG, CHOLHDL, LDLDIRECT,  in the last 72 hours Thyroid Function Tests: No results found for this basename: TSH, T4TOTAL, FREET3, T3FREE, THYROIDAB,  in the last 72 hours Anemia Panel: No results found for this basename: VITAMINB12, FOLATE, FERRITIN, TIBC, IRON, RETICCTPCT,  in the last 72 hours  RADIOLOGY: Dg Chest  1 View  10/06/2012   *RADIOLOGY REPORT*  Clinical Data: Shortness of breath.  Fluid overload.  Current history of chronic renal insufficiency, COPD, sleep apnea, asthma, hypertension, diabetes.  Prior history of CHF.  CHEST - 1 VIEW  Comparison: Portable chest x-ray earlier same date 0732 hours, 09/13/2012, 09/03/2012, 03/17/2012.  Findings: Lateral view of the chest read in conjunction with the AP portable chest x-ray earlier today demonstrates suboptimal inspiration with crowded bronchovascular markings in the lower lobes.  Cardiac silhouette moderately to markedly enlarged. Pulmonary venous hypertension without overt edema.  Very small bilateral pleural effusions.  Degenerative changes involving the thoracic spine.  IMPRESSION: Cardiomegaly and pulmonary venous hypertension without overt edema. Small bilateral pleural effusions.   Original Report Authenticated By: Hulan Saas, M.D.   Nm Pulmonary Perf And Vent  10/07/2012   CLINICAL DATA:  Dyspnea.  EXAM: NUCLEAR MEDICINE VENTILATION - PERFUSION LUNG SCAN   TECHNIQUE: Ventilation images were obtained in multiple projections using inhaled aerosol technetium 99 M DTPA. Perfusion images were obtained in multiple projections after intravenous injection of Tc-80m MAA.  COMPARISON:  None.  RADIOPHARMACEUTICALS:  40 mCi Tc-68m DTPA aerosol and 6 mCi Tc-31m MAA  FINDINGS: Ventilation: None diagnostic DTPA ventilation images the due to tracheostomy.  Perfusion: No wedge shaped peripheral perfusion defects to suggest acute pulmonary embolism.  IMPRESSION: No pulmonary perfusion defects to suggest acute pulmonary embolism.   Electronically Signed   By: Myles Rosenthal   On: 10/07/2012 14:22   Dg Chest Port 1 View  10/09/2012   *RADIOLOGY REPORT*  Clinical Data: Congestive heart failure.  PORTABLE CHEST - 1 VIEW  Comparison: 10/07/2012  Findings: Tracheostomy tube is midline with tip above the carina. The heart size appears moderately enlarged.  There are low lung volumes.  Decreased aeration to the left lung base is again noted and appears unchanged from previous exam.  Right lung appears clear.  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  IMPRESSION:  1.  Persistent decreased aeration to the left base which may represent atelectasis and/or infiltrate.   Original Report Authenticated By: Signa Kell, M.D.   Dg Chest Port 1 View  10/07/2012   *RADIOLOGY REPORT*  Clinical Data: Dyspnea  PORTABLE CHEST - 1 VIEW  Comparison: 10/06/2012  Findings: Cardiac enlargement stable.  Tracheostomy tube unchanged. Right lung is clear.  Cannot evaluate the retrocardiac area well on single AP view due to superimposition of cardiac silhouette.  There appears to be discoid atelectasis in the lingula.  The diaphragm is visible.  IMPRESSION: Discoid atelectasis lingula.  Cannot exclude retrocardiac opacities.  Right lung is clear.   Original Report Authenticated By: Esperanza Heir, M.D.   Dg Chest Portable 1 View  10/06/2012   *RADIOLOGY REPORT*  Clinical Data: Shortness of  breath.  PORTABLE CHEST - 1 VIEW  Comparison: 09/13/2012.  Findings: The tracheostomy tube is in good position.  Heart is enlarged but stable.  Stable elevation of the left hemidiaphragm. Mild chronic vascular congestion without overt pulmonary edema or pleural effusion.  IMPRESSION:  1.  Stable tracheostomy tube. 2.  Stable cardiac enlargement and mild chronic vascular congestion. 3.  No overt pulmonary edema, focal infiltrates or effusions.   Original Report Authenticated By: Rudie Meyer, M.D.   Dg Chest Port 1 View  09/13/2012   *RADIOLOGY REPORT*  Clinical Data: Shortness of breath.  Chronic respiratory failure.  PORTABLE CHEST - 1 VIEW  Comparison: 09/08/2012  Findings: Tracheostomy tube is unchanged.  Cardiomegaly.  Left lower lobe  atelectasis or infiltrate appears increased since prior study.  No confluent opacity on the right.  No right effusion. Cannot exclude left effusion.  No acute bony abnormality.  IMPRESSION: Increasing left lower lobe atelectasis or infiltrate.  Cannot exclude a component of effusion.   Original Report Authenticated By: Charlett Nose, M.D.    PHYSICAL EXAM General: NAD, chronically ill, trach Neck: JVP difficult, no thyromegaly or thyroid nodule.  Lungs: Decreased breath sounds at bases bilaterally CV: Nondisplaced PMI.  Heart regular S1/S2, no S3/S4, no murmur.  No peripheral edema.   Abdomen: Soft, nontender, no hepatosplenomegaly, no distention.  Neurologic: Alert and oriented x 3.  Psych: Normal affect. Extremities: No clubbing or cyanosis.   TELEMETRY: Reviewed telemetry pt in NSR  ASSESSMENT AND PLAN: 72 yo with chronic respiratory failure/trach in setting of COPD and OHS/OSA also with chronic LLL collapse, CKD IV, and CHF presented with increased dyspnea and was thought to be volume overloaded.  She has been admitted 10+ times in the last year.  She has never had outpatient cardiology followup.  1. CHF: Acute on chronic.  EF 45% with moderate pulmonary HTN  and RV failure on last echo.  Volume status is difficult.  She has been transitioned already to po Lasix.  She is significantly negative by total I/Os though weight is minimally changed (this may be because she is being weighed in the bed).   - Continue Coreg (increase to 12.5 mg bid with high BP), hydral/Imdur.  No ACEI with significant CKD.  - Continue Lasix 80 mg po bid. She was on torsemide 20 mg daily at admission.  This is clearly an inadequate diuretic dose for her.  Creatinine is up a bit today but would not change current diuretic dose.  Continue to follow.  2. Chronic respiratory failure: COPD, OHS/OSA, chronic LLL collapse.  Needs ongoing pulmonary followup.  3. CKD stage IV: Creatinine up a bit today, was transitioned to po diuretic yesterday.  Would continue to follow.  4. Patient needs better outpatient followup in terms of home health and MD appts (many, many admissions but no outpatient followup with cardiology or pulmonology ever!).  She needs to have scheduled appts with CHF clinic, nephrology, and pulmonology in place and discussed with whatever caregiver drives her (if she will not go to SNF) before she leaves the hospital.   [name] 10/10/2012 7:58 AM

## 2012-10-10 NOTE — Progress Notes (Signed)
I have seen and examined this patient and agree with plan as outlined by Dr. Virgina Organ.  Would start to decrease lasix dose tomorrow back to her outpt dose as she is nearing euvolemia.  Her wheezes and rhonchi are likely related to her pulm disease and Scr is slightly increased. Iysha Mishkin A,MD 10/10/2012 9:15 AM

## 2012-10-10 NOTE — Progress Notes (Signed)
[facility] ROUNDING NOTE   Subjective:   [name] was seen and examined at bedside this morning.  She reports feeling better but still wheezing.  She denies any chest pain, N/V/D, abdominal pain, fever, or chills at this time.   Objective:  Vital signs in last 24 hours:  Temp:  [97.9 F (36.6 C)-98.9 F (37.2 C)] 98.1 F (36.7 C) (08/19 0746) Pulse Rate:  [57-67] 66 (08/19 0746) Resp:  [17-34] 22 (08/19 0746) BP: (125-169)/(53-80) 169/75 mmHg (08/19 0746) SpO2:  [97 %-100 %] 98 % (08/19 0746) FiO2 (%):  [28 %] 28 % (08/19 0746) Weight:  [179 lb 14.3 oz (81.6 kg)-181 lb 3.5 oz (82.2 kg)] 179 lb 14.3 oz (81.6 kg) (08/19 0500)  Weight change: 0 lb (0 kg) Filed Weights   10/09/12 0600 10/10/12 0418 10/10/12 0500  Weight: 181 lb 3.5 oz (82.2 kg) 181 lb 3.5 oz (82.2 kg) 179 lb 14.3 oz (81.6 kg)   Intake/Output: I/O last 3 completed shifts: In: 726 [P.O.:720; I.V.:6] Out: 4275 [Urine:4275]  Physical Exam:  Vitals reviewed.  General:lying in bed, trach in place HEENT: PERRL, EOMI  Cardiac: RRR  Pulm: diffuse wheezing and coarse breath sounds b/l  Abd: soft, obese, nontender, nondistended, BS present  Ext: warm and well perfused, mild lower extremity edema  Neuro: alert, answering some questions, moving all 4 extremities Skin: wrinkled  Basic Metabolic Panel:  Recent Labs Lab 10/07/12 0600 10/07/12 1230 10/08/12 0850 10/09/12 0330 10/10/12 0600  NA 141 139 138 138 137  K 3.7 3.7 3.6 3.5 3.7  CL 104 103 101 99 98  CO2 28 28 29 30 30   GLUCOSE 107* 97 101* 97 108*  BUN 49* 46* 43* 43* 48*  CREATININE 2.81* 2.73* 2.75* 2.85* 3.06*  CALCIUM 8.9 9.0 9.5 9.2 9.1  MG  --   --  1.9 2.0 1.9  PHOS  --  3.3 3.4 3.8 3.8   Liver Function Tests:  Recent Labs Lab 10/06/12 0715 10/07/12 0600 10/07/12 1230 10/08/12 0850 10/09/12 0330 10/10/12 0600  AST 15 11  --   --   --   --   ALT 8 6  --   --   --   --   ALKPHOS 69 63  --   --   --   --   BILITOT  0.3 0.2*  --   --   --   --   PROT 6.1 5.6*  --   --   --   --   ALBUMIN 3.0* 2.8* 2.6* 2.8* 2.6* 2.6*   CBC:  Recent Labs Lab 10/07/12 0600 10/08/12 0420 10/08/12 0850 10/09/12 0330 10/10/12 0600  WBC 8.2 7.3 7.7 7.5 9.2  HGB 8.4* 8.2* 9.1* 8.5* 8.6*  HCT 27.0* 26.4* 28.3* 27.4* 27.4*  MCV 81.6 81.5 80.4 81.8 80.8  PLT 209 194 168 183 186   Cardiac Enzymes:  Recent Labs Lab 10/06/12 0830 10/06/12 1432 10/06/12 2300  TROPONINI <0.30 <0.30 <0.30   BNP    Component Value Date/Time   PROBNP 3450.0* 10/09/2012 2302   CBG:  Recent Labs Lab 10/08/12 2347 10/09/12 0731 10/09/12 1206 10/09/12 2035 10/10/12 0744  GLUCAP 97 101* 264* 131* 120*   Microbiology: Results for orders placed during the hospital encounter of 10/06/12  MRSA PCR SCREENING     Status: None   Collection Time    10/06/12  1:09 PM      Result Value Range Status   MRSA by PCR NEGATIVE  NEGATIVE Final   Comment:            The GeneXpert MRSA Assay (FDA     approved for NASAL specimens     only), is one component of a     comprehensive MRSA colonization     surveillance program. It is not     intended to diagnose MRSA     infection nor to guide or     monitor treatment for     MRSA infections.   Imaging: Dg Chest Port 1 View  10/09/2012   *RADIOLOGY REPORT*  Clinical Data: Congestive heart failure.  PORTABLE CHEST - 1 VIEW  Comparison: 10/07/2012  Findings: Tracheostomy tube is midline with tip above the carina. The heart size appears moderately enlarged.  There are low lung volumes.  Decreased aeration to the left lung base is again noted and appears unchanged from previous exam.  Right lung appears clear.  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  IMPRESSION:  1.  Persistent decreased aeration to the left base which may represent atelectasis and/or infiltrate.   Original Report Authenticated By: Signa Kell, M.D.    Medications:     . albuterol  2.5 mg Nebulization  QID  . amLODipine  10 mg Oral Daily  . antiseptic oral rinse  15 mL Mouth Rinse Daily  . arformoterol  15 mcg Nebulization BID  . aspirin  81 mg Oral Daily  . atorvastatin  40 mg Oral q1800  . budesonide  0.25 mg Nebulization BID  . busPIRone  5 mg Oral BID  . carvedilol  12.5 mg Oral BID WC  . cloNIDine  0.1 mg Oral BID  . clopidogrel  75 mg Oral Q breakfast  . dextromethorphan-guaiFENesin  1 tablet Oral BID  . ferrous sulfate  325 mg Oral BID  . furosemide  80 mg Oral BID  . heparin  5,000 Units Subcutaneous Q8H  . hydrALAZINE  100 mg Oral TID  . insulin aspart  0-9 Units Subcutaneous TID WC  . ipratropium  0.5 mg Nebulization QID  . isosorbide mononitrate  60 mg Oral Daily  . metoCLOPramide  5 mg Oral QID  . pantoprazole  40 mg Oral Daily  . sertraline  100 mg Oral QHS  . sodium chloride  3 mL Intravenous Q12H  . vitamin C  500 mg Oral Daily   sodium chloride, acetaminophen, acetaminophen, albuterol, LORazepam, morphine injection, ondansetron (ZOFRAN) IV, ondansetron, oxyCODONE, sodium chloride, sodium chloride  Assessment/ Plan:  Ms. Parisi is a 73 y.o. female with extensive PMHx significant for CKD stage 4 (follow with Dr. Hyman Hopes), CHF EF 45% with hypokinesis and grade 2 diastolic dysfunction (echo 06/2012), and COPD/asthma with OSA and trach and oxygen dependent admitted to [facility] on 10/06/2012 for acute on chronic respiratory failure.   CKD stage 4--Cr increased today to 3.06 with continued diuresis.  Cr on admission 2.76 (around baseline) and follows with Dr. Hyman Hopes. Hx of HTN and baseline Cr appears to be 2.5-2.8. Urine protein to creatinine ration 3.68. Currently on lasix.  -trend renal function  -continue diuresis -daily weights,weight today 179lb -strict I/Os: -9.29 L since admission  Acute on chronic respiratory failure--trach dependent with hx of OSA, COPD/asthma, and o2 dependent on 4L [place] at home. ?secondary to acute decompensated CHF. Wheezing on physical exam. No clear  infiltrate on cxr. VQ scan not suggestive of acute PE.  -management per primary team   Anemia of chronic disease in setting of CKD stage 4: Hb  8.8 on admission.  Iron panel 08/2012: Iron 164, Ferritin 35, TIBC 225, Folate 10.6, sat ratios 73%.  Received Aranesp x1 yesterday.    -trend Hb    CHF--EF 45% with hypokinesis and grade 2 diastolic dysfunction per echo 02/6107. Presenting with worsening SOB and lower extremity edema. Weight appears slightly up from baseline, to 183lb today and was 179lb on admission. Home regimen includes: norvasc, asa, lipitor, buspar, coreg, clonidine, plavix, hydralazine, IMDUR, and torsemide. On IV lasix at this time.  -management per primary team   Case discussed and patient seen with Dr. Arrie Aran   LOS: 4 Signed: Darden Palmer, MD PGY-2, Internal Medicine Resident Pager: [phone]  10/10/2012,8:24 AM

## 2012-10-10 NOTE — Progress Notes (Addendum)
[facility] PROGRESS NOTE  [name] ZOX:[id number] DOB: 20-Mar-1940 DOA: 10/06/2012 PCP: Gwynneth Aliment, MD  Assessment/Plan: Active Problems:   Dyspnea   Respiratory failure, acute and chronic   CHF (congestive heart failure)   COPD (chronic obstructive pulmonary disease)   CKD (chronic kidney disease), stage IV   DM (diabetes mellitus)   GERD (gastroesophageal reflux disease)    1. Dyspnea/Respiratory failure, acute and chronic: Patient presented with progressive SOB, associated with increasing bilateral LE edema, in the setting of O2-dependent COPD/OSA and chronic respiratory failure. She had no fever or chills, cough was productive of whitish phlegm, and wcc was normal. CXR revealed cardiomegaly and pulmonary venous hypertension without overt edema.  Small bilateral pleural effusions. Clinically, the etiology is acute decompensation of systolic CHF, superimposed on reduced respiratory reserve. ABG (10/07/12) showed PH 7.461/PCO2 39.7/PO2 91.6/Bicarb 27.9. V/Q scan was negative. Managing as described below, as well as supportive treatment. Have already arranged pulmonary clinic follow up, on discharge.  2. CHF (congestive heart failure): Patient has known hypertensive cardiomyopathy and CHF, with known EF of 45% per 2D Echocardiogram 06/2012. She has had progressive LE edema in addition to SOB, and ProBNP was elevated at 2416 at presentation. CXR findings are described in #1 above. Patient appreciably responded to iv Lasix and NTG infusion in the ED, and was continued thereafter on iv Lasix 80 mg BID. Clinically, she is improving gradually. Dr Dietrich Pates provided cardiology consultation, and patient was subsequently seen by Dr [name]. She has been transitioned to oral Lasix effective 10/09/12, and we are managing as recommended. 3. COPD (chronic obstructive pulmonary disease): Not in acute exacerbation clinically, at this time. Managing with bronchodilators.  4. CKD  (chronic kidney disease), stage IV: Baseline creatinine is 2.74-3.27, from 08/2012. Creatinine at presentation was 2.76, ie, at baseline. Primary nephrologist is Dr Elvis Coil. CKD may be contributory to fluid overload. Dr Malen Gauze provided nephrology consultation, and we are managing as recommended. Following renal indices.  5. DM (diabetes mellitus): This is diet-controlled type-2. Random blood glucose at presentation, was normal at 96. Managing with diet/SS, and CBGs are reasonable.  6. GERD (gastroesophageal reflux disease): Asymptomatic.  7. Gout: Asymptomatic.  8. HTN: BP appears reasonable at this time. Patient was on multiple antihypertensives pre-admisson, which we have continued.    Code Status: Full Code.  Family Communication:  Disposition Plan: To be determined.   Brief narrative: 72 year old female, with history of Obesity, OSA, Asthma/COPD, chronic respiratory failure, s/p Trach, on home O2 at 4L/Min, Chronic Cor Pulmonale, HTN, s/p CVA, Cardiomyopathy, CHF, EF 45% per 2D Echocardiogram 06/2012, CKD-4, Gout, Dyslipidemia, chronic anemia/Thrombocytopenia, Diet-controlled DM-2, GERD, OA, presenting with progressive shortness of breath over 2-3 days, unassociated with chest pain, fever or chills. She has had a mild cough productive of whitish phlegm, as well as progressive bilateral LE swelling. Denies abdominal pain, vomiting or diarrhea. Daughter brought her to the ED on 10/06/12, because of worsening symptoms. In the ED, she was administered iv Lasix and NTG infusion, with amelioration of symptoms. Admitted for further evaluation and management.    Consultants:  N/A.   Procedures:  CXR.   Antibiotics:  N/A.   HPI/Subjective: Feels much better.   Objective: Vital signs in last 24 hours: Temp:  [97.9 F (36.6 C)-98.9 F (37.2 C)] 98.1 F (36.7 C) (08/19 0746) Pulse Rate:  [57-66] 61 (08/19 0841) Resp:  [16-34] 16 (08/19 0841) BP: (125-169)/(53-75) 151/66 mmHg  (08/19 0841) SpO2:  [97 %-100 %]  100 % (08/19 0841) FiO2 (%):  [28 %] 28 % (08/19 0841) Weight:  [81.6 kg (179 lb 14.3 oz)-82.2 kg (181 lb 3.5 oz)] 81.6 kg (179 lb 14.3 oz) (08/19 0500) Weight change: 0 kg (0 lb) Last BM Date: 10/08/12  Intake/Output from previous day: 08/18 0701 - 08/19 0700 In: 726 [P.O.:720; I.V.:6] Out: 2600 [Urine:2600]     Physical Exam: General: Alert, communicative, fully oriented, talking in complete sentences, not short of breath at rest.  HEENT: Mild clinical pallor, no jaundice, no conjunctival injection or discharge.  NECK: Supple, JVP not seen, no carotid bruits, no palpable lymphadenopathy, no palpable goiter. Patient has trach collar with Passey-Muir valve.  CHEST: Clinically clear to auscultation.  HEART: Sounds 1 and 2 heard, normal, regular, no murmurs.  ABDOMEN: Moderately obese, soft, non-tender, no palpable organomegaly, no palpable masses, normal bowel sounds.  GENITALIA: Not examined.  LOWER EXTREMITIES: No pitting edema, palpable peripheral pulses.  MUSCULOSKELETAL SYSTEM: Generalized osteoarthritic changes, otherwise, normal.  CENTRAL NERVOUS SYSTEM: No focal neurologic deficit on gross examination.   Lab Results:  Recent Labs  10/09/12 0330 10/10/12 0600  WBC 7.5 9.2  HGB 8.5* 8.6*  HCT 27.4* 27.4*  PLT 183 186    Recent Labs  10/09/12 0330 10/10/12 0600  NA 138 137  K 3.5 3.7  CL 99 98  CO2 30 30  GLUCOSE 97 108*  BUN 43* 48*  CREATININE 2.85* 3.06*  CALCIUM 9.2 9.1   Recent Results (from the past 240 hour(s))  MRSA PCR SCREENING     Status: None   Collection Time    10/06/12  1:09 PM      Result Value Range Status   MRSA by PCR NEGATIVE  NEGATIVE Final   Comment:            The GeneXpert MRSA Assay (FDA     approved for NASAL specimens     only), is one component of a     comprehensive MRSA colonization     surveillance program. It is not     intended to diagnose MRSA     infection nor to guide or      monitor treatment for     MRSA infections.     Studies/Results: Dg Chest Port 1 View  10/09/2012   *RADIOLOGY REPORT*  Clinical Data: Congestive heart failure.  PORTABLE CHEST - 1 VIEW  Comparison: 10/07/2012  Findings: Tracheostomy tube is midline with tip above the carina. The heart size appears moderately enlarged.  There are low lung volumes.  Decreased aeration to the left lung base is again noted and appears unchanged from previous exam.  Right lung appears clear.  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  IMPRESSION:  1.  Persistent decreased aeration to the left base which may represent atelectasis and/or infiltrate.   Original Report Authenticated By: Signa Kell, M.D.    Medications: Scheduled Meds: . albuterol  2.5 mg Nebulization QID  . amLODipine  10 mg Oral Daily  . antiseptic oral rinse  15 mL Mouth Rinse Daily  . arformoterol  15 mcg Nebulization BID  . aspirin  81 mg Oral Daily  . atorvastatin  40 mg Oral q1800  . budesonide  0.25 mg Nebulization BID  . busPIRone  5 mg Oral BID  . carvedilol  12.5 mg Oral BID WC  . cloNIDine  0.1 mg Oral BID  . clopidogrel  75 mg Oral Q breakfast  . dextromethorphan-guaiFENesin  1 tablet Oral BID  .  ferrous sulfate  325 mg Oral BID  . furosemide  80 mg Oral BID  . heparin  5,000 Units Subcutaneous Q8H  . hydrALAZINE  100 mg Oral TID  . insulin aspart  0-9 Units Subcutaneous TID WC  . ipratropium  0.5 mg Nebulization QID  . isosorbide mononitrate  60 mg Oral Daily  . metoCLOPramide  5 mg Oral QID  . pantoprazole  40 mg Oral Daily  . sertraline  100 mg Oral QHS  . sodium chloride  3 mL Intravenous Q12H  . vitamin C  500 mg Oral Daily   Continuous Infusions:  PRN Meds:.sodium chloride, acetaminophen, acetaminophen, albuterol, LORazepam, morphine injection, ondansetron (ZOFRAN) IV, ondansetron, oxyCODONE, sodium chloride, sodium chloride    LOS: 4 days   Maia Handa,CHRISTOPHER  [facility] Pager  [phone]. If 8PM-8AM, please contact night-coverage at [email or web address], password [facility] 10/10/2012, 9:31 AM  LOS: 4 days

## 2012-10-11 LAB — RENAL FUNCTION PANEL
Albumin: 2.6 g/dL — ABNORMAL LOW (ref 3.5–5.2)
BUN: 50 mg/dL — ABNORMAL HIGH (ref 6–23)
Calcium: 9.1 mg/dL (ref 8.4–10.5)
Creatinine, Ser: 3.1 mg/dL — ABNORMAL HIGH (ref 0.50–1.10)
GFR calc non Af Amer: 14 mL/min — ABNORMAL LOW (ref >90)

## 2012-10-11 LAB — PRO B NATRIURETIC PEPTIDE: Pro B Natriuretic peptide (BNP): 2714 pg/mL — ABNORMAL HIGH (ref 0–125)

## 2012-10-11 LAB — MAGNESIUM: Magnesium: 2.1 mg/dL (ref 1.5–2.5)

## 2012-10-11 LAB — CBC
HCT: 26.7 % — ABNORMAL LOW (ref 36.0–46.0)
MCHC: 31.5 g/dL (ref 30.0–36.0)
RDW: 17.5 % — ABNORMAL HIGH (ref 11.5–15.5)

## 2012-10-11 LAB — GLUCOSE, CAPILLARY: Glucose-Capillary: 120 mg/dL — ABNORMAL HIGH (ref 70–99)

## 2012-10-11 MED ORDER — FUROSEMIDE 40 MG PO TABS
40.0000 mg | ORAL_TABLET | Freq: Two times a day (BID) | ORAL | Status: AC
  Administered 2012-10-12 (×2): 40 mg via ORAL
  Filled 2012-10-11 (×3): qty 1

## 2012-10-11 NOTE — Progress Notes (Signed)
Patient ID: [name], female   DOB: [birth year], 72 y.o.   MRN: [id number]    SUBJECTIVE: Breathing better than at admission but still a "little heavy."  No chest pain.   Marland Kitchen albuterol  2.5 mg Nebulization QID  . amLODipine  10 mg Oral Daily  . antiseptic oral rinse  15 mL Mouth Rinse Daily  . arformoterol  15 mcg Nebulization BID  . aspirin  81 mg Oral Daily  . atorvastatin  40 mg Oral q1800  . budesonide  0.25 mg Nebulization BID  . busPIRone  5 mg Oral BID  . carvedilol  12.5 mg Oral BID WC  . cloNIDine  0.1 mg Oral BID  . clopidogrel  75 mg Oral Q breakfast  . dextromethorphan-guaiFENesin  1 tablet Oral BID  . ferrous sulfate  325 mg Oral BID  . furosemide  80 mg Oral BID  . heparin  5,000 Units Subcutaneous Q8H  . hydrALAZINE  100 mg Oral TID  . insulin aspart  0-9 Units Subcutaneous TID WC  . ipratropium  0.5 mg Nebulization QID  . isosorbide mononitrate  60 mg Oral Daily  . metoCLOPramide  5 mg Oral QID  . pantoprazole  40 mg Oral Daily  . sertraline  100 mg Oral QHS  . sodium chloride  3 mL Intravenous Q12H  . vitamin C  500 mg Oral Daily      Filed Vitals:   10/11/12 0414 10/11/12 0500 10/11/12 0753 10/11/12 0759  BP: 132/56  132/56 153/64  Pulse: 59  66 61  Temp: 97.6 F (36.4 C)   98.3 F (36.8 C)  TempSrc: Oral   Oral  Resp: 21  28 20   Height:      Weight: 81.6 kg (179 lb 14.3 oz) 79.8 kg (175 lb 14.8 oz)    SpO2: 98%   98%    Intake/Output Summary (Last 24 hours) at 10/11/12 0842 Last data filed at 10/11/12 0804  Gross per 24 hour  Intake    723 ml  Output   1975 ml  Net  -1252 ml    LABS: Basic Metabolic Panel:  Recent Labs  78/29/56 0600 10/11/12 0500  NA 137 138  K 3.7 3.8  CL 98 99  CO2 30 28  GLUCOSE 108* 104*  BUN 48* 50*  CREATININE 3.06* 3.10*  CALCIUM 9.1 9.1  MG 1.9 2.1  PHOS 3.8 4.2   Liver Function Tests:  Recent Labs  10/10/12 0600 10/11/12 0500  ALBUMIN 2.6* 2.6*   No results found for this basename:  LIPASE, AMYLASE,  in the last 72 hours CBC:  Recent Labs  10/10/12 0600 10/11/12 0500  WBC 9.2 8.7  HGB 8.6* 8.4*  HCT 27.4* 26.7*  MCV 80.8 81.7  PLT 186 180   Cardiac Enzymes: No results found for this basename: CKTOTAL, CKMB, CKMBINDEX, TROPONINI,  in the last 72 hours BNP: No components found with this basename: POCBNP,  D-Dimer: No results found for this basename: DDIMER,  in the last 72 hours Hemoglobin A1C: No results found for this basename: HGBA1C,  in the last 72 hours Fasting Lipid Panel: No results found for this basename: CHOL, HDL, LDLCALC, TRIG, CHOLHDL, LDLDIRECT,  in the last 72 hours Thyroid Function Tests: No results found for this basename: TSH, T4TOTAL, FREET3, T3FREE, THYROIDAB,  in the last 72 hours Anemia Panel: No results found for this basename: VITAMINB12, FOLATE, FERRITIN, TIBC, IRON, RETICCTPCT,  in the last 72 hours  RADIOLOGY: Dg Chest  1 View  10/06/2012   *RADIOLOGY REPORT*  Clinical Data: Shortness of breath.  Fluid overload.  Current history of chronic renal insufficiency, COPD, sleep apnea, asthma, hypertension, diabetes.  Prior history of CHF.  CHEST - 1 VIEW  Comparison: Portable chest x-ray earlier same date 0732 hours, 09/13/2012, 09/03/2012, 03/17/2012.  Findings: Lateral view of the chest read in conjunction with the AP portable chest x-ray earlier today demonstrates suboptimal inspiration with crowded bronchovascular markings in the lower lobes.  Cardiac silhouette moderately to markedly enlarged. Pulmonary venous hypertension without overt edema.  Very small bilateral pleural effusions.  Degenerative changes involving the thoracic spine.  IMPRESSION: Cardiomegaly and pulmonary venous hypertension without overt edema. Small bilateral pleural effusions.   Original Report Authenticated By: Hulan Saas, M.D.   Nm Pulmonary Perf And Vent  10/07/2012   CLINICAL DATA:  Dyspnea.  EXAM: NUCLEAR MEDICINE VENTILATION - PERFUSION LUNG SCAN   TECHNIQUE: Ventilation images were obtained in multiple projections using inhaled aerosol technetium 99 M DTPA. Perfusion images were obtained in multiple projections after intravenous injection of Tc-35m MAA.  COMPARISON:  None.  RADIOPHARMACEUTICALS:  40 mCi Tc-70m DTPA aerosol and 6 mCi Tc-42m MAA  FINDINGS: Ventilation: None diagnostic DTPA ventilation images the due to tracheostomy.  Perfusion: No wedge shaped peripheral perfusion defects to suggest acute pulmonary embolism.  IMPRESSION: No pulmonary perfusion defects to suggest acute pulmonary embolism.   Electronically Signed   By: Myles Rosenthal   On: 10/07/2012 14:22   Dg Chest Port 1 View  10/09/2012   *RADIOLOGY REPORT*  Clinical Data: Congestive heart failure.  PORTABLE CHEST - 1 VIEW  Comparison: 10/07/2012  Findings: Tracheostomy tube is midline with tip above the carina. The heart size appears moderately enlarged.  There are low lung volumes.  Decreased aeration to the left lung base is again noted and appears unchanged from previous exam.  Right lung appears clear.  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  IMPRESSION:  1.  Persistent decreased aeration to the left base which may represent atelectasis and/or infiltrate.   Original Report Authenticated By: Signa Kell, M.D.   Dg Chest Port 1 View  10/07/2012   *RADIOLOGY REPORT*  Clinical Data: Dyspnea  PORTABLE CHEST - 1 VIEW  Comparison: 10/06/2012  Findings: Cardiac enlargement stable.  Tracheostomy tube unchanged. Right lung is clear.  Cannot evaluate the retrocardiac area well on single AP view due to superimposition of cardiac silhouette.  There appears to be discoid atelectasis in the lingula.  The diaphragm is visible.  IMPRESSION: Discoid atelectasis lingula.  Cannot exclude retrocardiac opacities.  Right lung is clear.   Original Report Authenticated By: Esperanza Heir, M.D.   Dg Chest Portable 1 View  10/06/2012   *RADIOLOGY REPORT*  Clinical Data: Shortness of  breath.  PORTABLE CHEST - 1 VIEW  Comparison: 09/13/2012.  Findings: The tracheostomy tube is in good position.  Heart is enlarged but stable.  Stable elevation of the left hemidiaphragm. Mild chronic vascular congestion without overt pulmonary edema or pleural effusion.  IMPRESSION:  1.  Stable tracheostomy tube. 2.  Stable cardiac enlargement and mild chronic vascular congestion. 3.  No overt pulmonary edema, focal infiltrates or effusions.   Original Report Authenticated By: Rudie Meyer, M.D.   Dg Chest Port 1 View  09/13/2012   *RADIOLOGY REPORT*  Clinical Data: Shortness of breath.  Chronic respiratory failure.  PORTABLE CHEST - 1 VIEW  Comparison: 09/08/2012  Findings: Tracheostomy tube is unchanged.  Cardiomegaly.  Left lower lobe  atelectasis or infiltrate appears increased since prior study.  No confluent opacity on the right.  No right effusion. Cannot exclude left effusion.  No acute bony abnormality.  IMPRESSION: Increasing left lower lobe atelectasis or infiltrate.  Cannot exclude a component of effusion.   Original Report Authenticated By: Charlett Nose, M.D.    PHYSICAL EXAM General: NAD, chronically ill, trach Neck: JVP difficult, no thyromegaly or thyroid nodule.  Lungs: Decreased breath sounds at bases bilaterally CV: Nondisplaced PMI.  Heart regular S1/S2, no S3/S4, no murmur.  No peripheral edema.   Abdomen: Soft, nontender, no hepatosplenomegaly, no distention.  Neurologic: Alert and oriented x 3.  Psych: Normal affect. Extremities: No clubbing or cyanosis.   TELEMETRY: Reviewed telemetry pt in NSR  ASSESSMENT AND PLAN: 72 yo with chronic respiratory failure/trach in setting of COPD and OHS/OSA also with chronic LLL collapse, CKD IV, and CHF presented with increased dyspnea and was thought to be volume overloaded.  She has been admitted 10+ times in the last year.  She has never had outpatient cardiology followup.  1. CHF: Acute on chronic.  EF 45% with moderate pulmonary HTN  and RV failure on last echo.  Volume status is difficult.  She has been transitioned to po Lasix.  She is significantly negative by total I/Os with weight down about 8 lbs overall.   - Continue Coreg (increase to 12.5 mg bid with high BP), hydral/Imdur.  No ACEI with significant CKD.  - She was on torsemide 20 mg daily at admission.  This is clearly an inadequate diuretic dose for her based on recurrent admissions with CHF exacerbations. She will need a higher diuretic dose as an outpatient.  Since she has been on torsemide already, would probably use torsemide 60 mg daily at discharge.  Renal function stable yesterday to today, a bit up from baseline however.  2. Chronic respiratory failure: COPD, OHS/OSA, chronic LLL collapse.  Needs ongoing pulmonary followup.  3. CKD stage IV: Renal function stabilized since transition to po diuretic. 4. Patient needs better outpatient followup in terms of home health and MD appts (many, many admissions but no outpatient followup with cardiology or pulmonology ever!).  She needs to have scheduled appts with CHF clinic, nephrology, and pulmonology in place and discussed with whatever caregiver drives her (if she will not go to SNF) before she leaves the hospital.   [name] 10/11/2012 8:42 AM

## 2012-10-11 NOTE — Progress Notes (Signed)
I have seen and examined this patient and agree with plan as outlined by Dr. Virgina Organ.  Pt still c/o of SOB however she is negative 5kg.  Will stop IV lasix and start po lasix tomorrow. Bassel Gaskill A,MD 10/11/2012 9:52 AM

## 2012-10-11 NOTE — Progress Notes (Signed)
[facility] Progress Note [place] TEAM 1 - Stepdown/ICU TEAM   [name] YQM:[id number] DOB: [birth year] DOA: 10/06/2012 PCP: Gwynneth Aliment, MD  Brief narrative: 72 year old female, with history of Obesity, OSA, Asthma/COPD, chronic respiratory failure s/p Trach, on home O2 at 4L/Min, Chronic Cor Pulmonale, HTN, s/p CVA, Cardiomyopathy, CHF (EF 45% per 2D Echocardiogram 06/2012), CKD-4, Gout, Dyslipidemia, chronic anemia/thrombocytopenia, diet-controlled DM-2, GERD, OA, who presented with progressive shortness of breath over 2-3 days, unassociated with chest pain, fever or chills. She had a mild cough productive of whitish phlegm, as well as progressive bilateral LE swelling. Denied abdominal pain, vomiting or diarrhea. Daughter brought her to the ED on 10/06/12, because of worsening symptoms. In the ED, she was administered iv Lasix and NTG infusion, with amelioration of symptoms.   Assessment/Plan:  Dyspnea/Respiratory failure, acute on chronic Multifactorial, with volume overload as primary contributor - see other issues detailed below  Acute on Chronic CHF - systolic + R heart failure / mod pulm HTN EF 45% with hypokinesis and grade 2 diastolic dysfunction per echo 06/2839 - 11L net negative since admit - diuretic dosing per Cardiology/CHF Team - clinically improving   COPD Well compensated at present / no wheezing on exam  OHS/OSA  Trach dependent   Chronic LLL collapse Stable via CXR  CKD stage IV baseline Cr appears to be 2.5-2.8 - No ACEI - Nephrology following   Anemia of chronic disease in setting of CKD Hb 8.8 on admission - holding relatively steady at this time - no evidence of acute blood loss  DM Reasonably well controlled at present - no change in tx plan today   GERD  Gout Well compensated at this time   HTN Well controlled at present   Code Status: FULL Family Communication: no family present at time of exam  Disposition Plan: transfer  to tele bed - begin PT/OT to determine proper venue for eventual d/c - will need close f/u in outpt setting to prevent early re-admit   Consultants: Nephrology Cardiolgoy  Procedures: none  Antibiotics: none  DVT prophylaxis: SQ heparin  HPI/Subjective: Pt is alert and conversant.  She has no new complaints.  She states she is w/o chest pain, n/v, or abdom pain, and that her SOB is steadily improving.  Objective: Blood pressure 130/54, pulse 59, temperature 98.3 F (36.8 C), temperature source Oral, resp. rate 21, height 5\' 4"  (1.626 m), weight 79.8 kg (175 lb 14.8 oz), SpO2 100.00%.  Intake/Output Summary (Last 24 hours) at 10/11/12 1416 Last data filed at 10/11/12 1212  Gross per 24 hour  Intake    363 ml  Output   1776 ml  Net  -1413 ml   Exam: General: No acute respiratory distress at rest  Lungs: very distant BS in all fields but w/o appreciable wheeze or focal crackles Cardiovascular: Regular rate and rhythm without murmur gallop or rub normal S1 and S2 Abdomen: Nontender, nondistended, soft, bowel sounds positive, no rebound, no ascites, no appreciable mass Extremities: No significant cyanosis, clubbing, or edema bilateral lower extremities  Data Reviewed: Basic Metabolic Panel:  Recent Labs Lab 10/07/12 1230 10/08/12 0850 10/09/12 0330 10/10/12 0600 10/11/12 0500  NA 139 138 138 137 138  K 3.7 3.6 3.5 3.7 3.8  CL 103 101 99 98 99  CO2 28 29 30 30 28   GLUCOSE 97 101* 97 108* 104*  BUN 46* 43* 43* 48* 50*  CREATININE 2.73* 2.75* 2.85* 3.06* 3.10*  CALCIUM 9.0 9.5 9.2  9.1 9.1  MG  --  1.9 2.0 1.9 2.1  PHOS 3.3 3.4 3.8 3.8 4.2   Liver Function Tests:  Recent Labs Lab 10/06/12 0715 10/07/12 0600 10/07/12 1230 10/08/12 0850 10/09/12 0330 10/10/12 0600 10/11/12 0500  AST 15 11  --   --   --   --   --   ALT 8 6  --   --   --   --   --   ALKPHOS 69 63  --   --   --   --   --   BILITOT 0.3 0.2*  --   --   --   --   --   PROT 6.1 5.6*  --   --   --    --   --   ALBUMIN 3.0* 2.8* 2.6* 2.8* 2.6* 2.6* 2.6*   CBC:  Recent Labs Lab 10/08/12 0420 10/08/12 0850 10/09/12 0330 10/10/12 0600 10/11/12 0500  WBC 7.3 7.7 7.5 9.2 8.7  HGB 8.2* 9.1* 8.5* 8.6* 8.4*  HCT 26.4* 28.3* 27.4* 27.4* 26.7*  MCV 81.5 80.4 81.8 80.8 81.7  PLT 194 168 183 186 180   Cardiac Enzymes:  Recent Labs Lab 10/06/12 0830 10/06/12 1432 10/06/12 2300  TROPONINI <0.30 <0.30 <0.30   BNP (last 3 results)  Recent Labs  10/09/12 1045 10/09/12 2302 10/11/12 0500  PROBNP 3863.0* 3450.0* 2714.0*   CBG:  Recent Labs Lab 10/10/12 1144 10/10/12 1711 10/10/12 2045 10/11/12 0758 10/11/12 1209  GLUCAP 177* 155* 148* 114* 120*    Recent Results (from the past 240 hour(s))  MRSA PCR SCREENING     Status: None   Collection Time    10/06/12  1:09 PM      Result Value Range Status   MRSA by PCR NEGATIVE  NEGATIVE Final   Comment:            The GeneXpert MRSA Assay (FDA     approved for NASAL specimens     only), is one component of a     comprehensive MRSA colonization     surveillance program. It is not     intended to diagnose MRSA     infection nor to guide or     monitor treatment for     MRSA infections.     Studies:  Recent x-ray studies have been reviewed in detail by the Attending Physician  Scheduled Meds:  Scheduled Meds: . albuterol  2.5 mg Nebulization QID  . amLODipine  10 mg Oral Daily  . antiseptic oral rinse  15 mL Mouth Rinse Daily  . arformoterol  15 mcg Nebulization BID  . aspirin  81 mg Oral Daily  . atorvastatin  40 mg Oral q1800  . budesonide  0.25 mg Nebulization BID  . busPIRone  5 mg Oral BID  . carvedilol  12.5 mg Oral BID WC  . cloNIDine  0.1 mg Oral BID  . clopidogrel  75 mg Oral Q breakfast  . dextromethorphan-guaiFENesin  1 tablet Oral BID  . ferrous sulfate  325 mg Oral BID  . [START ON 10/12/2012] furosemide  40 mg Oral BID  . heparin  5,000 Units Subcutaneous Q8H  . hydrALAZINE  100 mg Oral TID   . insulin aspart  0-9 Units Subcutaneous TID WC  . ipratropium  0.5 mg Nebulization QID  . isosorbide mononitrate  60 mg Oral Daily  . metoCLOPramide  5 mg Oral QID  . pantoprazole  40 mg Oral Daily  .  sertraline  100 mg Oral QHS  . sodium chloride  3 mL Intravenous Q12H  . vitamin C  500 mg Oral Daily    Time spent on care of this patient: 35 mins   [facility] T  [facility] Office  [phone] Pager - Text Page per Loretha Stapler as per below:  On-Call/Text Page:      [email or web address]      password [email or web address]  If 7PM-7AM, please contact night-coverage [email or web address] Password [email or web address] 10/11/2012, 2:16 PM   LOS: 5 days

## 2012-10-11 NOTE — Progress Notes (Signed)
[facility] ROUNDING NOTE   Subjective:   [name] was seen and examined at bedside this morning.  She just completed a breathing treatment and reports feeling the same as yesterday with no improvement or worsening.  She denies any chest pain, N/V/D, abdominal pain, fever, or chills at this time.   Objective:  Vital signs in last 24 hours:  Temp:  [97.6 F (36.4 C)-98.6 F (37 C)] 98.3 F (36.8 C) (08/20 0759) Pulse Rate:  [54-66] 61 (08/20 0759) Resp:  [16-28] 20 (08/20 0759) BP: (119-153)/(52-75) 153/64 mmHg (08/20 0759) SpO2:  [98 %-100 %] 98 % (08/20 0759) FiO2 (%):  [28 %] 28 % (08/20 0759) Weight:  [175 lb 14.8 oz (79.8 kg)-179 lb 14.3 oz (81.6 kg)] 175 lb 14.8 oz (79.8 kg) (08/20 0500)  Weight change: -1 lb 5.2 oz (-0.6 kg) Filed Weights   10/10/12 0500 10/11/12 0414 10/11/12 0500  Weight: 179 lb 14.3 oz (81.6 kg) 179 lb 14.3 oz (81.6 kg) 175 lb 14.8 oz (79.8 kg)   Intake/Output: I/O last 3 completed shifts: In: 726 [P.O.:720; I.V.:6] Out: 2200 [Urine:2200]  Physical Exam:  Vitals reviewed.  General:lying in bed, trach in place HEENT: PERRL, EOMI  Cardiac: RRR  Pulm: diffuse wheezing and coarse breath sounds b/l  Abd: soft, obese, nontender, nondistended, BS present  Ext: warm and well perfused, mild lower extremity edema  Neuro: alert, answering some questions, moving all 4 extremities Skin: wrinkled  Basic Metabolic Panel:  Recent Labs Lab 10/07/12 1230 10/08/12 0850 10/09/12 0330 10/10/12 0600 10/11/12 0500  NA 139 138 138 137 138  K 3.7 3.6 3.5 3.7 3.8  CL 103 101 99 98 99  CO2 28 29 30 30 28   GLUCOSE 97 101* 97 108* 104*  BUN 46* 43* 43* 48* 50*  CREATININE 2.73* 2.75* 2.85* 3.06* 3.10*  CALCIUM 9.0 9.5 9.2 9.1 9.1  MG  --  1.9 2.0 1.9 2.1  PHOS 3.3 3.4 3.8 3.8 4.2   Liver Function Tests:  Recent Labs Lab 10/06/12 0715 10/07/12 0600 10/07/12 1230 10/08/12 0850 10/09/12 0330 10/10/12 0600 10/11/12 0500  AST 15 11   --   --   --   --   --   ALT 8 6  --   --   --   --   --   ALKPHOS 69 63  --   --   --   --   --   BILITOT 0.3 0.2*  --   --   --   --   --   PROT 6.1 5.6*  --   --   --   --   --   ALBUMIN 3.0* 2.8* 2.6* 2.8* 2.6* 2.6* 2.6*   CBC:  Recent Labs Lab 10/08/12 0420 10/08/12 0850 10/09/12 0330 10/10/12 0600 10/11/12 0500  WBC 7.3 7.7 7.5 9.2 8.7  HGB 8.2* 9.1* 8.5* 8.6* 8.4*  HCT 26.4* 28.3* 27.4* 27.4* 26.7*  MCV 81.5 80.4 81.8 80.8 81.7  PLT 194 168 183 186 180   Cardiac Enzymes:  Recent Labs Lab 10/06/12 0830 10/06/12 1432 10/06/12 2300  TROPONINI <0.30 <0.30 <0.30   BNP    Component Value Date/Time   PROBNP 2714.0* 10/11/2012 0500   CBG:  Recent Labs Lab 10/10/12 0744 10/10/12 1144 10/10/12 1711 10/10/12 2045 10/11/12 0758  GLUCAP 120* 177* 155* 148* 114*   Microbiology: Results for orders placed during the hospital encounter of 10/06/12  MRSA PCR SCREENING     Status:  None   Collection Time    10/06/12  1:09 PM      Result Value Range Status   MRSA by PCR NEGATIVE  NEGATIVE Final   Comment:            The GeneXpert MRSA Assay (FDA     approved for NASAL specimens     only), is one component of a     comprehensive MRSA colonization     surveillance program. It is not     intended to diagnose MRSA     infection nor to guide or     monitor treatment for     MRSA infections.   Imaging: Dg Chest Port 1 View  10/09/2012   *RADIOLOGY REPORT*  Clinical Data: Congestive heart failure.  PORTABLE CHEST - 1 VIEW  Comparison: 10/07/2012  Findings: Tracheostomy tube is midline with tip above the carina. The heart size appears moderately enlarged.  There are low lung volumes.  Decreased aeration to the left lung base is again noted and appears unchanged from previous exam.  Right lung appears clear.  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  IMPRESSION:  1.  Persistent decreased aeration to the left base which may represent atelectasis and/or  infiltrate.   Original Report Authenticated By: Signa Kell, M.D.    Medications:     . albuterol  2.5 mg Nebulization QID  . amLODipine  10 mg Oral Daily  . antiseptic oral rinse  15 mL Mouth Rinse Daily  . arformoterol  15 mcg Nebulization BID  . aspirin  81 mg Oral Daily  . atorvastatin  40 mg Oral q1800  . budesonide  0.25 mg Nebulization BID  . busPIRone  5 mg Oral BID  . carvedilol  12.5 mg Oral BID WC  . cloNIDine  0.1 mg Oral BID  . clopidogrel  75 mg Oral Q breakfast  . dextromethorphan-guaiFENesin  1 tablet Oral BID  . ferrous sulfate  325 mg Oral BID  . furosemide  80 mg Oral BID  . heparin  5,000 Units Subcutaneous Q8H  . hydrALAZINE  100 mg Oral TID  . insulin aspart  0-9 Units Subcutaneous TID WC  . ipratropium  0.5 mg Nebulization QID  . isosorbide mononitrate  60 mg Oral Daily  . metoCLOPramide  5 mg Oral QID  . pantoprazole  40 mg Oral Daily  . sertraline  100 mg Oral QHS  . sodium chloride  3 mL Intravenous Q12H  . vitamin C  500 mg Oral Daily   sodium chloride, acetaminophen, acetaminophen, albuterol, LORazepam, morphine injection, ondansetron (ZOFRAN) IV, ondansetron, oxyCODONE, sodium chloride, sodium chloride  Assessment/ Plan:  [name] is a 72 y.o. female with extensive PMHx significant for CKD stage 4 (follow with Dr. Hyman Hopes), CHF EF 45% with hypokinesis and grade 2 diastolic dysfunction (echo 06/2012), and COPD/asthma with OSA and trach and oxygen dependent admitted to [facility] on 10/06/2012 for acute on chronic respiratory failure.   CKD stage 4--on continued diuresis.  Cr on admission 2.76 (around baseline) and follows with Dr. Hyman Hopes. Hx of HTN and baseline Cr appears to be 2.5-2.8. Urine protein to creatinine ration 3.68. Currently on lasix.   Recent Labs Lab 10/07/12 1230 10/08/12 0850 10/09/12 0330 10/10/12 0600 10/11/12 0500  CREATININE 2.73* 2.75* 2.85* 3.06* 3.10*   -trend renal function  -continue diuresis, decrease lasix dose  today -daily weights,weight today 175lb -strict I/Os: -10.5 L since admission  Acute on chronic respiratory failure--trach dependent with hx  of OSA, COPD/asthma, and o2 dependent on 4L  at home. ?secondary to acute decompensated CHF. Wheezing on physical exam. No clear infiltrate on cxr. VQ scan not suggestive of acute PE.  -management per primary team   Anemia of chronic disease in setting of CKD stage 4: Hb 8.8 on admission.  Iron panel 08/2012: Iron 164, Ferritin 35, TIBC 225, Folate 10.6, sat ratios 73%.  Received Aranesp x1 yesterday.    -trend Hb    CHF--EF 45% with hypokinesis and grade 2 diastolic dysfunction per echo 04/863. Presenting with worsening SOB and lower extremity edema. Weight appears slightly up from baseline, to 183lb today and was 179lb on admission. Home regimen includes: norvasc, asa, lipitor, buspar, coreg, clonidine, plavix, hydralazine, IMDUR, and torsemide. On IV lasix at this time.  -management per primary team   Case discussed and patient seen with Dr. Arrie Aran   LOS: 5 Signed: Darden Palmer, MD PGY-2, Internal Medicine Resident Pager: [phone]  10/11/2012,8:34 AM

## 2012-10-11 NOTE — Procedures (Signed)
**Note De-Identified Kyana Aicher Obfuscation** Tracheostomy Change Note  Patient Details:   Name: [name] DOB: 10/19/1940 MRN: [id number]    Airway Documentation:     Evaluation  O2 sats: stable throughout Complications: No apparent complications Patient did tolerate procedure well. Bilateral Breath Sounds: Clear;Diminished Suctioning: Airway Change per RT protocol, +CO2 Sherida Dobkins, Megan Salon 10/11/2012, 2:30 PM

## 2012-10-12 LAB — RENAL FUNCTION PANEL
Albumin: 2.7 g/dL — ABNORMAL LOW (ref 3.5–5.2)
BUN: 52 mg/dL — ABNORMAL HIGH (ref 6–23)
Chloride: 99 mEq/L (ref 96–112)
GFR calc Af Amer: 17 mL/min — ABNORMAL LOW (ref >90)
Glucose, Bld: 103 mg/dL — ABNORMAL HIGH (ref 70–99)
Phosphorus: 4.3 mg/dL (ref 2.3–4.6)
Potassium: 4.1 mEq/L (ref 3.5–5.1)
Sodium: 137 mEq/L (ref 135–145)

## 2012-10-12 LAB — GLUCOSE, CAPILLARY
Glucose-Capillary: 133 mg/dL — ABNORMAL HIGH (ref 70–99)
Glucose-Capillary: 147 mg/dL — ABNORMAL HIGH (ref 70–99)
Glucose-Capillary: 134 mg/dL — ABNORMAL HIGH (ref 70–99)

## 2012-10-12 MED ORDER — TORSEMIDE 20 MG PO TABS
60.0000 mg | ORAL_TABLET | Freq: Every day | ORAL | Status: DC
  Administered 2012-10-13 – 2012-10-16 (×4): 60 mg via ORAL
  Filled 2012-10-12 (×5): qty 3

## 2012-10-12 NOTE — Progress Notes (Signed)
Patient ID: [name], female   DOB: February 22, 1941, 72 y.o.   MRN: [id number]   SUBJECTIVE: Breathing better than at admission but still a "little heavy."  No chest pain.  More alert and interactive than yesterday.   Marland Kitchen albuterol  2.5 mg Nebulization QID  . amLODipine  10 mg Oral Daily  . antiseptic oral rinse  15 mL Mouth Rinse Daily  . arformoterol  15 mcg Nebulization BID  . aspirin  81 mg Oral Daily  . atorvastatin  40 mg Oral q1800  . budesonide  0.25 mg Nebulization BID  . busPIRone  5 mg Oral BID  . carvedilol  12.5 mg Oral BID WC  . cloNIDine  0.1 mg Oral BID  . clopidogrel  75 mg Oral Q breakfast  . dextromethorphan-guaiFENesin  1 tablet Oral BID  . ferrous sulfate  325 mg Oral BID  . furosemide  40 mg Oral BID  . heparin  5,000 Units Subcutaneous Q8H  . hydrALAZINE  100 mg Oral TID  . insulin aspart  0-9 Units Subcutaneous TID WC  . ipratropium  0.5 mg Nebulization QID  . isosorbide mononitrate  60 mg Oral Daily  . metoCLOPramide  5 mg Oral QID  . pantoprazole  40 mg Oral Daily  . sertraline  100 mg Oral QHS  . [START ON 10/13/2012] torsemide  60 mg Oral Daily  . vitamin C  500 mg Oral Daily      Filed Vitals:   10/12/12 0623 10/12/12 0817 10/12/12 0916 10/12/12 1222  BP: 142/47     Pulse: 60  61 57  Temp: 98 F (36.7 C)     TempSrc: Oral     Resp: 20  20 20   Height:      Weight:  77.6 kg (171 lb 1.2 oz)    SpO2: 96%  97% 100%    Intake/Output Summary (Last 24 hours) at 10/12/12 1307 Last data filed at 10/12/12 1048  Gross per 24 hour  Intake    240 ml  Output   1775 ml  Net  -1535 ml    LABS: Basic Metabolic Panel:  Recent Labs  13/08/65 0600 10/11/12 0500 10/12/12 0630  NA 137 138 137  K 3.7 3.8 4.1  CL 98 99 99  CO2 30 28 29   GLUCOSE 108* 104* 103*  BUN 48* 50* 52*  CREATININE 3.06* 3.10* 3.04*  CALCIUM 9.1 9.1 9.1  MG 1.9 2.1  --   PHOS 3.8 4.2 4.3   Liver Function Tests:  Recent Labs  10/11/12 0500 10/12/12 0630  ALBUMIN  2.6* 2.7*   No results found for this basename: LIPASE, AMYLASE,  in the last 72 hours CBC:  Recent Labs  10/10/12 0600 10/11/12 0500  WBC 9.2 8.7  HGB 8.6* 8.4*  HCT 27.4* 26.7*  MCV 80.8 81.7  PLT 186 180   Cardiac Enzymes: No results found for this basename: CKTOTAL, CKMB, CKMBINDEX, TROPONINI,  in the last 72 hours BNP: No components found with this basename: POCBNP,  D-Dimer: No results found for this basename: DDIMER,  in the last 72 hours Hemoglobin A1C: No results found for this basename: HGBA1C,  in the last 72 hours Fasting Lipid Panel: No results found for this basename: CHOL, HDL, LDLCALC, TRIG, CHOLHDL, LDLDIRECT,  in the last 72 hours Thyroid Function Tests: No results found for this basename: TSH, T4TOTAL, FREET3, T3FREE, THYROIDAB,  in the last 72 hours Anemia Panel: No results found for this basename: VITAMINB12, FOLATE,  FERRITIN, TIBC, IRON, RETICCTPCT,  in the last 72 hours  RADIOLOGY: Dg Chest 1 View  10/06/2012   *RADIOLOGY REPORT*  Clinical Data: Shortness of breath.  Fluid overload.  Current history of chronic renal insufficiency, COPD, sleep apnea, asthma, hypertension, diabetes.  Prior history of CHF.  CHEST - 1 VIEW  Comparison: Portable chest x-ray earlier same date 0732 hours, 09/13/2012, 09/03/2012, 03/17/2012.  Findings: Lateral view of the chest read in conjunction with the AP portable chest x-ray earlier today demonstrates suboptimal inspiration with crowded bronchovascular markings in the lower lobes.  Cardiac silhouette moderately to markedly enlarged. Pulmonary venous hypertension without overt edema.  Very small bilateral pleural effusions.  Degenerative changes involving the thoracic spine.  IMPRESSION: Cardiomegaly and pulmonary venous hypertension without overt edema. Small bilateral pleural effusions.   Original Report Authenticated By: Hulan Saas, M.D.   Nm Pulmonary Perf And Vent  10/07/2012   CLINICAL DATA:  Dyspnea.  EXAM: NUCLEAR  MEDICINE VENTILATION - PERFUSION LUNG SCAN  TECHNIQUE: Ventilation images were obtained in multiple projections using inhaled aerosol technetium 99 M DTPA. Perfusion images were obtained in multiple projections after intravenous injection of Tc-17m MAA.  COMPARISON:  None.  RADIOPHARMACEUTICALS:  40 mCi Tc-15m DTPA aerosol and 6 mCi Tc-40m MAA  FINDINGS: Ventilation: None diagnostic DTPA ventilation images the due to tracheostomy.  Perfusion: No wedge shaped peripheral perfusion defects to suggest acute pulmonary embolism.  IMPRESSION: No pulmonary perfusion defects to suggest acute pulmonary embolism.   Electronically Signed   By: Myles Rosenthal   On: 10/07/2012 14:22   Dg Chest Port 1 View  10/09/2012   *RADIOLOGY REPORT*  Clinical Data: Congestive heart failure.  PORTABLE CHEST - 1 VIEW  Comparison: 10/07/2012  Findings: Tracheostomy tube is midline with tip above the carina. The heart size appears moderately enlarged.  There are low lung volumes.  Decreased aeration to the left lung base is again noted and appears unchanged from previous exam.  Right lung appears clear.  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  IMPRESSION:  1.  Persistent decreased aeration to the left base which may represent atelectasis and/or infiltrate.   Original Report Authenticated By: Signa Kell, M.D.   Dg Chest Port 1 View  10/07/2012   *RADIOLOGY REPORT*  Clinical Data: Dyspnea  PORTABLE CHEST - 1 VIEW  Comparison: 10/06/2012  Findings: Cardiac enlargement stable.  Tracheostomy tube unchanged. Right lung is clear.  Cannot evaluate the retrocardiac area well on single AP view due to superimposition of cardiac silhouette.  There appears to be discoid atelectasis in the lingula.  The diaphragm is visible.  IMPRESSION: Discoid atelectasis lingula.  Cannot exclude retrocardiac opacities.  Right lung is clear.   Original Report Authenticated By: Esperanza Heir, M.D.   Dg Chest Portable 1 View  10/06/2012    *RADIOLOGY REPORT*  Clinical Data: Shortness of breath.  PORTABLE CHEST - 1 VIEW  Comparison: 09/13/2012.  Findings: The tracheostomy tube is in good position.  Heart is enlarged but stable.  Stable elevation of the left hemidiaphragm. Mild chronic vascular congestion without overt pulmonary edema or pleural effusion.  IMPRESSION:  1.  Stable tracheostomy tube. 2.  Stable cardiac enlargement and mild chronic vascular congestion. 3.  No overt pulmonary edema, focal infiltrates or effusions.   Original Report Authenticated By: Rudie Meyer, M.D.   Dg Chest Port 1 View  09/13/2012   *RADIOLOGY REPORT*  Clinical Data: Shortness of breath.  Chronic respiratory failure.  PORTABLE CHEST - 1 VIEW  Comparison: 09/08/2012  Findings: Tracheostomy tube is unchanged.  Cardiomegaly.  Left lower lobe atelectasis or infiltrate appears increased since prior study.  No confluent opacity on the right.  No right effusion. Cannot exclude left effusion.  No acute bony abnormality.  IMPRESSION: Increasing left lower lobe atelectasis or infiltrate.  Cannot exclude a component of effusion.   Original Report Authenticated By: Charlett Nose, M.D.    PHYSICAL EXAM General: NAD, chronically ill, trach Neck: JVP difficult, no thyromegaly or thyroid nodule.  Lungs: Decreased breath sounds at bases bilaterally CV: Nondisplaced PMI.  Heart regular S1/S2, no S3/S4, no murmur.  No peripheral edema.   Abdomen: Soft, nontender, no hepatosplenomegaly, no distention.  Neurologic: Alert and oriented x 3.  Psych: Normal affect. Extremities: No clubbing or cyanosis.   TELEMETRY: Reviewed telemetry pt in NSR  ASSESSMENT AND PLAN: 72 yo with chronic respiratory failure/trach in setting of COPD and OHS/OSA also with chronic LLL collapse, CKD IV, and CHF presented with increased dyspnea and was thought to be volume overloaded.  She has been admitted 10+ times in the last year.  She has never had outpatient cardiology followup.  1. CHF: Acute  on chronic.  EF 45% with moderate pulmonary HTN and RV failure on last echo.  Volume status is difficult.  She has been transitioned to po Lasix.  She is significantly negative by total I/Os with weight down about 12 lbs overall.   - Continue Coreg, hydral/Imdur.  No ACEI with significant CKD.  - She was on torsemide 20 mg daily at admission.  This is clearly an inadequate diuretic dose for her based on recurrent admissions with CHF exacerbations. She will need a higher diuretic dose as an outpatient.  Since she has been on torsemide already, would probably use torsemide 60 mg daily at discharge (will start tomorrow).  Renal function stable yesterday to today, a bit up from baseline however.  2. Chronic respiratory failure: COPD, OHS/OSA, chronic LLL collapse.  Needs ongoing pulmonary followup.  3. CKD stage IV: Renal function stabilized since transition to po diuretic. 4. Patient needs better outpatient followup in terms of home health and MD appts (many, many admissions but no outpatient followup with cardiology or pulmonology ever!).  She needs to have scheduled appts with CHF clinic, nephrology, and pulmonology in place and discussed with whatever caregiver drives her (if she will not go to SNF) before she leaves the hospital.  She currently is not agreeable to SNF placement though I think this is her best option.   [name] 10/12/2012 1:07 PM

## 2012-10-12 NOTE — Plan of Care (Signed)
Problem: Phase I Progression Outcomes Goal: EF % per last Echo/documented,Core Reminder form on chart Outcome: Completed/Met Date Met:  10/12/12 EF 45%(07-13-12)

## 2012-10-12 NOTE — Evaluation (Signed)
Physical Therapy Evaluation Patient Details Name: [name] MRN: [id number] DOB: 08/30/1940 Today's Date: 10/12/2012 Time: 0825-0850 PT Time Calculation (min): 25 min  PT Assessment / Plan / Recommendation History of Present Illness  This is a 72 year old female, with history of Obesity, OSA, Asthma/COPD, chronic respiratory failure, s/p Trach, on home O2 at 4L/Min, Chronic Cor Pulmonale, HTN, s/p CVA, Cardiomyopathy, CHF, EF 45% per 2D Echocardiogram 06/2012, CKD-4, Gout, Dyslipidemia, chronic anemia/Thrombocytopenia, Diet-controlled DM-2, GERD, OA, presenting with progressive shortness of breath over 2-3 days, unassociated with chest pain, fever or chills. She has had a mild cough productive of whitish phlegm, as well as progressive bilateral LE swelling. Denies abdominal pain, vomiting or diarrhea. Daughter brought her to the ED today, because of worsening symptoms. In the ED, she was administered iv Lasix and NTG infusion, with amelioration of symptoms.   Clinical Impression  Pt admitted with the above. Pt currently with functional limitations due to the deficits listed below (see PT Problem List). Pt with flat affect and very little conversation.  Attempted to call family to determine if any caregiver support.  Recommend SNF unless pt has 24 hour assistance.  Pt will benefit from skilled PT to increase their independence and safety with mobility to allow discharge to the venue listed below.      PT Assessment  Patient needs continued PT services    Follow Up Recommendations  SNF    Equipment Recommendations  None recommended by PT    Frequency Min 3X/week    Precautions / Restrictions Precautions Precautions: Fall Restrictions Weight Bearing Restrictions: No   Pertinent Vitals/Pain No c/o pain; Sa02 > 90%      Mobility  Bed Mobility Bed Mobility: Supine to Sit Supine to Sit: 4: Min guard;With rails;HOB elevated Details for Bed Mobility Assistance: Minguard for  safety and needed extra time to complete Transfers Transfers: Sit to Stand;Stand to Sit Sit to Stand: 4: Min assist;From bed Stand to Sit: 4: Min assist;To chair/3-in-1 Details for Transfer Assistance: (A) to initiate transfer and slowly descend to recliner Ambulation/Gait Ambulation/Gait Assistance: 4: Min assist Ambulation Distance (Feet): 4 Feet Assistive device: Rolling walker Ambulation/Gait Assistance Details: (A) to maintain balance and manage RW Gait Pattern: Decreased stride length;Shuffle;Trunk flexed;Narrow base of support;Step-through pattern Gait velocity: decreased Stairs: No    Exercises     PT Diagnosis: Difficulty walking;Generalized weakness  PT Problem List: Decreased strength;Decreased activity tolerance;Decreased balance;Decreased mobility;Decreased knowledge of use of DME;Cardiopulmonary status limiting activity PT Treatment Interventions: DME instruction;Gait training;Functional mobility training;Therapeutic activities;Therapeutic exercise;Balance training;Patient/family education     PT Goals(Current goals can be found in the care plan section) Acute Rehab PT Goals Patient Stated Goal: To go home PT Goal Formulation: With patient Time For Goal Achievement: 10/19/12 Potential to Achieve Goals: Good  Visit Information  Last PT Received On: 10/12/12 History of Present Illness: This is a 72 year old female, with history of Obesity, OSA, Asthma/COPD, chronic respiratory failure, s/p Trach, on home O2 at 4L/Min, Chronic Cor Pulmonale, HTN, s/p CVA, Cardiomyopathy, CHF, EF 45% per 2D Echocardiogram 06/2012, CKD-4, Gout, Dyslipidemia, chronic anemia/Thrombocytopenia, Diet-controlled DM-2, GERD, OA, presenting with progressive shortness of breath over 2-3 days, unassociated with chest pain, fever or chills. She has had a mild cough productive of whitish phlegm, as well as progressive bilateral LE swelling. Denies abdominal pain, vomiting or diarrhea. Daughter brought her  to the ED today, because of worsening symptoms. In the ED, she was administered iv Lasix and NTG infusion, with  amelioration of symptoms.        Prior Functioning  Home Living Family/patient expects to be discharged to:: Private residence Living Arrangements: Other relatives Available Help at Discharge: Family;Available PRN/intermittently Type of Home: House Home Access: Level entry Home Layout: One level Home Equipment: Walker - 2 wheels;Wheelchair - manual;Grab bars - toilet;Grab bars - tub/shower;Shower seat Additional Comments: Pt vague regarding home situation and ability for daughter to help her. Prior Function Level of Independence: Needs assistance Gait / Transfers Assistance Needed: pt primariliy uses w/c. short distance amb with RW with home PT ADL's / Homemaking Assistance Needed: assist with cooking and bathing. pt dresses self indep. Comments: Pt reluctant to provide information Communication Communication: No difficulties;Passy-Muir valve;Tracheostomy Dominant Hand: Right    Cognition  Cognition Arousal/Alertness: Awake/alert Behavior During Therapy: Flat affect Overall Cognitive Status: Within Functional Limits for tasks assessed    Extremity/Trunk Assessment Lower Extremity Assessment Lower Extremity Assessment: Generalized weakness   Balance Balance Balance Assessed: Yes Static Sitting Balance Static Sitting - Balance Support: Feet supported Static Sitting - Level of Assistance: 5: Stand by assistance  End of Session PT - End of Session Equipment Utilized During Treatment: Gait belt;Oxygen (trach 4L) Activity Tolerance: Patient limited by fatigue Patient left: in chair;with call bell/phone within reach Nurse Communication: Mobility status  GP     [name] 10/12/2012, 10:49 AM Jake Shark, PT DPT [phone]

## 2012-10-12 NOTE — Progress Notes (Signed)
I have seen and examined this patient and agree with plan as outlined by Dr. Virgina Organ.  Would recommend torsemide 40mg  daily upon discharge. Difficult situation as i dont feel she is strong enough to go home, however she is unwilling to go to SNF. [name] A,MD 10/12/2012 10:42 AM

## 2012-10-12 NOTE — Progress Notes (Signed)
[facility] Progress Note [place] TEAM 1 - Stepdown/ICU TEAM   [name] ZOX:[id number] DOB: 01-09-1941 DOA: 10/06/2012 PCP: Gwynneth Aliment, MD  Brief narrative: 72 year old female, with history of Obesity, OSA, Asthma/COPD, chronic respiratory failure s/p Trach, on home O2 at 4L/Min, Chronic Cor Pulmonale, HTN, s/p CVA, Cardiomyopathy, CHF (EF 45% per 2D Echocardiogram 06/2012), CKD-4, Gout, Dyslipidemia, chronic anemia/thrombocytopenia, diet-controlled DM-2, GERD, OA, who presented with progressive shortness of breath over 2-3 days, unassociated with chest pain, fever or chills. She had a mild cough productive of whitish phlegm, as well as progressive bilateral LE swelling. Denied abdominal pain, vomiting or diarrhea. Daughter brought her to the ED on 10/06/12, because of worsening symptoms. In the ED, she was administered iv Lasix and NTG infusion, with amelioration of symptoms.   Assessment/Plan:  Dyspnea/Respiratory failure, acute on chronic Multifactorial, with volume overload as primary contributor - see other issues detailed below  Acute on Chronic CHF - systolic + R heart failure / mod pulm HTN EF 45% with hypokinesis and grade 2 diastolic dysfunction per echo 09/1189  - 12 L net negative since admit - diuretic dosing per Cardiology/CHF Team -    COPD Well compensated at present / no wheezing on exam  OHS/OSA  Trach dependent   Chronic LLL collapse Stable via CXR  CKD stage IV baseline Cr appears to be 2.5-2.8 - No ACEI - Nephrology following   Anemia of chronic disease in setting of CKD Hb 8.8 on admission - holding relatively steady at this time - no evidence of acute blood loss  DM Reasonably well controlled at present - no change in tx plan today   GERD  Gout Well compensated at this time   HTN Well controlled at present   Code Status: FULL Family Communication: daughter in room Disposition Plan: PT recommends SNF but pt and daugther  refusing- prefer to go home- daughter not at home during the day- have told pt that she will need to be stable on her feet prior to going home  Consultants: Nephrology Cardiolgoy  Procedures: none  Antibiotics: none  DVT prophylaxis: SQ heparin  HPI/Subjective: Pt is alert and conversant.  Looks at me suspiciously- tells me she is feeling better- when discussing PT recommendations for SNF, she becomes annoyed and tells me her daughter does not want her to go there- daugther lying in bed not facing me - states Mom will go home- I asked if she will have 24 hr supervision - daughter states she works during the day - pt states she does fine at home alone and even does her own cooking.   Objective: Blood pressure 120/54, pulse 58, temperature 97.7 F (36.5 C), temperature source Oral, resp. rate 20, height 5\' 4"  (1.626 m), weight 77.6 kg (171 lb 1.2 oz), SpO2 100.00%.  Intake/Output Summary (Last 24 hours) at 10/12/12 1703 Last data filed at 10/12/12 1400  Gross per 24 hour  Intake    480 ml  Output   1475 ml  Net   -995 ml   Exam: General: No acute respiratory distress at rest  Lungs: very distant BS in all fields but w/o appreciable wheeze or focal crackles Cardiovascular: Regular rate and rhythm without murmur gallop or rub normal S1 and S2 Abdomen: Nontender, nondistended, soft, bowel sounds positive, no rebound, no ascites, no appreciable mass Extremities: No significant cyanosis, clubbing, or edema bilateral lower extremities  Data Reviewed: Basic Metabolic Panel:  Recent Labs Lab 10/08/12 0850 10/09/12 0330 10/10/12  0600 10/11/12 0500 10/12/12 0630  NA 138 138 137 138 137  K 3.6 3.5 3.7 3.8 4.1  CL 101 99 98 99 99  CO2 29 30 30 28 29   GLUCOSE 101* 97 108* 104* 103*  BUN 43* 43* 48* 50* 52*  CREATININE 2.75* 2.85* 3.06* 3.10* 3.04*  CALCIUM 9.5 9.2 9.1 9.1 9.1  MG 1.9 2.0 1.9 2.1  --   PHOS 3.4 3.8 3.8 4.2 4.3   Liver Function Tests:  Recent Labs Lab  10/06/12 0715 10/07/12 0600  10/08/12 0850 10/09/12 0330 10/10/12 0600 10/11/12 0500 10/12/12 0630  AST 15 11  --   --   --   --   --   --   ALT 8 6  --   --   --   --   --   --   ALKPHOS 69 63  --   --   --   --   --   --   BILITOT 0.3 0.2*  --   --   --   --   --   --   PROT 6.1 5.6*  --   --   --   --   --   --   ALBUMIN 3.0* 2.8*  < > 2.8* 2.6* 2.6* 2.6* 2.7*  < > = values in this interval not displayed. CBC:  Recent Labs Lab 10/08/12 0420 10/08/12 0850 10/09/12 0330 10/10/12 0600 10/11/12 0500  WBC 7.3 7.7 7.5 9.2 8.7  HGB 8.2* 9.1* 8.5* 8.6* 8.4*  HCT 26.4* 28.3* 27.4* 27.4* 26.7*  MCV 81.5 80.4 81.8 80.8 81.7  PLT 194 168 183 186 180   Cardiac Enzymes:  Recent Labs Lab 10/06/12 0830 10/06/12 1432 10/06/12 2300  TROPONINI <0.30 <0.30 <0.30   BNP (last 3 results)  Recent Labs  10/09/12 1045 10/09/12 2302 10/11/12 0500  PROBNP 3863.0* 3450.0* 2714.0*   CBG:  Recent Labs Lab 10/11/12 1655 10/11/12 2118 10/12/12 0630 10/12/12 1054 10/12/12 1605  GLUCAP 108* 133* 95 147* 102*    Recent Results (from the past 240 hour(s))  MRSA PCR SCREENING     Status: None   Collection Time    10/06/12  1:09 PM      Result Value Range Status   MRSA by PCR NEGATIVE  NEGATIVE Final   Comment:            The GeneXpert MRSA Assay (FDA     approved for NASAL specimens     only), is one component of a     comprehensive MRSA colonization     surveillance program. It is not     intended to diagnose MRSA     infection nor to guide or     monitor treatment for     MRSA infections.     Studies:  Recent x-ray studies have been reviewed in detail by the Attending Physician  Scheduled Meds:  Scheduled Meds: . albuterol  2.5 mg Nebulization QID  . amLODipine  10 mg Oral Daily  . antiseptic oral rinse  15 mL Mouth Rinse Daily  . arformoterol  15 mcg Nebulization BID  . aspirin  81 mg Oral Daily  . atorvastatin  40 mg Oral q1800  . budesonide  0.25 mg  Nebulization BID  . busPIRone  5 mg Oral BID  . carvedilol  12.5 mg Oral BID WC  . cloNIDine  0.1 mg Oral BID  . clopidogrel  75 mg Oral Q breakfast  . dextromethorphan-guaiFENesin  1 tablet Oral BID  . ferrous sulfate  325 mg Oral BID  . furosemide  40 mg Oral BID  . heparin  5,000 Units Subcutaneous Q8H  . hydrALAZINE  100 mg Oral TID  . insulin aspart  0-9 Units Subcutaneous TID WC  . ipratropium  0.5 mg Nebulization QID  . isosorbide mononitrate  60 mg Oral Daily  . metoCLOPramide  5 mg Oral QID  . pantoprazole  40 mg Oral Daily  . sertraline  100 mg Oral QHS  . [START ON 10/13/2012] torsemide  60 mg Oral Daily  . vitamin C  500 mg Oral Daily    Time spent on care of this patient: 35 mins   Calvert Cantor, MD  [facility] Office  [phone] Pager - Text Page per Loretha Stapler as per below:  On-Call/Text Page:      [email or web address]      password [email or web address]  If 7PM-7AM, please contact night-coverage [email or web address] Password [email or web address] 10/12/2012, 5:03 PM   LOS: 6 days

## 2012-10-12 NOTE — Progress Notes (Addendum)
[facility] ROUNDING NOTE   Subjective:   [name] was seen and examined at bedside this morning.  She denies any chest pain, N/V/D, abdominal pain, fever, or chills at this time.    Objective:  Vital signs in last 24 hours:  Temp:  [97.8 F (36.6 C)-98.3 F (36.8 C)] 98 F (36.7 C) (08/21 0623) Pulse Rate:  [57-68] 60 (08/21 0623) Resp:  [17-22] 20 (08/21 0623) BP: (127-142)/(47-63) 142/47 mmHg (08/21 0623) SpO2:  [96 %-100 %] 96 % (08/21 0623) FiO2 (%):  [28 %] 28 % (08/21 0623) Weight:  [171 lb 1.2 oz (77.6 kg)] 171 lb 1.2 oz (77.6 kg) (08/20 1645)  Weight change: -8 lb 13.1 oz (-4 kg) Filed Weights   10/11/12 0414 10/11/12 0500 10/11/12 1645  Weight: 179 lb 14.3 oz (81.6 kg) 175 lb 14.8 oz (79.8 kg) 171 lb 1.2 oz (77.6 kg)   Intake/Output: I/O last 3 completed shifts: In: 3 [I.V.:3] Out: 2551 [Urine:2550; Stool:1]  Physical Exam:  Vitals reviewed.  General:lying in bed, trach in place HEENT: PERRL, EOMI  Cardiac: RRR  Pulm: diffuse wheezing and coarse breath sounds b/l  Abd: soft, obese, nontender, nondistended, BS present  Ext: warm and well perfused, mild lower extremity edema  Neuro: alert, answering some questions, moving all 4 extremities Skin: wrinkled  Basic Metabolic Panel:  Recent Labs Lab 10/08/12 0850 10/09/12 0330 10/10/12 0600 10/11/12 0500 10/12/12 0630  NA 138 138 137 138 137  K 3.6 3.5 3.7 3.8 4.1  CL 101 99 98 99 99  CO2 29 30 30 28 29   GLUCOSE 101* 97 108* 104* 103*  BUN 43* 43* 48* 50* 52*  CREATININE 2.75* 2.85* 3.06* 3.10* 3.04*  CALCIUM 9.5 9.2 9.1 9.1 9.1  MG 1.9 2.0 1.9 2.1  --   PHOS 3.4 3.8 3.8 4.2 4.3   Liver Function Tests:  Recent Labs Lab 10/06/12 0715 10/07/12 0600  10/08/12 0850 10/09/12 0330 10/10/12 0600 10/11/12 0500 10/12/12 0630  AST 15 11  --   --   --   --   --   --   ALT 8 6  --   --   --   --   --   --   ALKPHOS 69 63  --   --   --   --   --   --   BILITOT 0.3 0.2*  --   --   --    --   --   --   PROT 6.1 5.6*  --   --   --   --   --   --   ALBUMIN 3.0* 2.8*  < > 2.8* 2.6* 2.6* 2.6* 2.7*  < > = values in this interval not displayed. CBC:  Recent Labs Lab 10/08/12 0420 10/08/12 0850 10/09/12 0330 10/10/12 0600 10/11/12 0500  WBC 7.3 7.7 7.5 9.2 8.7  HGB 8.2* 9.1* 8.5* 8.6* 8.4*  HCT 26.4* 28.3* 27.4* 27.4* 26.7*  MCV 81.5 80.4 81.8 80.8 81.7  PLT 194 168 183 186 180   Cardiac Enzymes:  Recent Labs Lab 10/06/12 0830 10/06/12 1432 10/06/12 2300  TROPONINI <0.30 <0.30 <0.30   BNP    Component Value Date/Time   PROBNP 2714.0* 10/11/2012 0500   CBG:  Recent Labs Lab 10/11/12 0758 10/11/12 1209 10/11/12 1655 10/11/12 2118 10/12/12 0630  GLUCAP 114* 120* 108* 133* 95   Microbiology: Results for orders placed during the hospital encounter of 10/06/12  MRSA PCR SCREENING  Status: None   Collection Time    10/06/12  1:09 PM      Result Value Range Status   MRSA by PCR NEGATIVE  NEGATIVE Final   Comment:            The GeneXpert MRSA Assay (FDA     approved for NASAL specimens     only), is one component of a     comprehensive MRSA colonization     surveillance program. It is not     intended to diagnose MRSA     infection nor to guide or     monitor treatment for     MRSA infections.   Imaging: No results found.  Medications:     . albuterol  2.5 mg Nebulization QID  . amLODipine  10 mg Oral Daily  . antiseptic oral rinse  15 mL Mouth Rinse Daily  . arformoterol  15 mcg Nebulization BID  . aspirin  81 mg Oral Daily  . atorvastatin  40 mg Oral q1800  . budesonide  0.25 mg Nebulization BID  . busPIRone  5 mg Oral BID  . carvedilol  12.5 mg Oral BID WC  . cloNIDine  0.1 mg Oral BID  . clopidogrel  75 mg Oral Q breakfast  . dextromethorphan-guaiFENesin  1 tablet Oral BID  . ferrous sulfate  325 mg Oral BID  . furosemide  40 mg Oral BID  . heparin  5,000 Units Subcutaneous Q8H  . hydrALAZINE  100 mg Oral TID  . insulin  aspart  0-9 Units Subcutaneous TID WC  . ipratropium  0.5 mg Nebulization QID  . isosorbide mononitrate  60 mg Oral Daily  . metoCLOPramide  5 mg Oral QID  . pantoprazole  40 mg Oral Daily  . sertraline  100 mg Oral QHS  . vitamin C  500 mg Oral Daily   acetaminophen, acetaminophen, albuterol, LORazepam, morphine injection, ondansetron (ZOFRAN) IV, ondansetron, oxyCODONE, sodium chloride  Assessment/ Plan:  [name] is a 72 y.o. female with extensive PMHx significant for CKD stage 4 (follow with Dr. Hyman Hopes), CHF EF 45% with hypokinesis and grade 2 diastolic dysfunction (echo 06/2012), and COPD/asthma with OSA and trach and oxygen dependent admitted to [facility] on 10/06/2012 for acute on chronic respiratory failure.   CKD stage 4--on continued diuresis.  Cr on admission 2.76 (around baseline) and follows with Dr. Hyman Hopes. Hx of HTN and baseline Cr appears to be 2.5-2.8. Urine protein to creatinine ration 3.68. Currently on lasix.   Recent Labs Lab 10/08/12 0850 10/09/12 0330 10/10/12 0600 10/11/12 0500 10/12/12 0630  CREATININE 2.75* 2.85* 3.06* 3.10* 3.04*   -trend renal function  -continue diuresis -daily weights,weight down to 171lb -strict I/Os: -12 L since admission  Acute on chronic respiratory failure--trach dependent with hx of OSA, COPD/asthma, and o2 dependent on 4L [place] at home. ?secondary to acute decompensated CHF. Wheezing on physical exam. No clear infiltrate on cxr. VQ scan not suggestive of acute PE.  -management per primary team   Anemia of chronic disease in setting of CKD stage 4: Hb 8.8 on admission.  Iron panel 08/2012: Iron 164, Ferritin 35, TIBC 225, Folate 10.6, sat ratios 73%.  Received Aranesp x1 yesterday.    -trend Hb    CHF--EF 45% with hypokinesis and grade 2 diastolic dysfunction per echo 05/979. Presenting with worsening SOB and lower extremity edema. Weight appears slightly up from baseline, to 183lb today and was 179lb on admission. Home regimen includes:  norvasc, asa, lipitor,  buspar, coreg, clonidine, plavix, hydralazine, IMDUR, and torsemide. On IV lasix at this time.  -management per primary team   Case discussed and patient seen with Dr. Arrie Aran.     LOS: 6 Signed: Darden Palmer, MD PGY-2, Internal Medicine Resident Pager: [phone]  10/12/2012,8:03 AM

## 2012-10-12 NOTE — Evaluation (Signed)
Occupational Therapy Evaluation Patient Details Name: [name] MRN: [id number] DOB: Jul 02, 1940 Today's Date: 10/12/2012 Time: 4098-1191 OT Time Calculation (min): 23 min  OT Assessment / Plan / Recommendation History of present illness This is a 72 year old female, with history of Obesity, OSA, Asthma/COPD, chronic respiratory failure, s/p Trach, on home O2 at 4L/Min, Chronic Cor Pulmonale, HTN, s/p CVA, Cardiomyopathy, CHF, EF 45% per 2D Echocardiogram 06/2012, CKD-4, Gout, Dyslipidemia, chronic anemia/Thrombocytopenia, Diet-controlled DM-2, GERD, OA, presenting with progressive shortness of breath over 2-3 days, unassociated with chest pain, fever or chills. She has had a mild cough productive of whitish phlegm, as well as progressive bilateral LE swelling. Denies abdominal pain, vomiting or diarrhea. Daughter brought her to the ED today, because of worsening symptoms. In the ED, she was administered iv Lasix and NTG infusion, with amelioration of symptoms.    Clinical Impression   Pt admitted with above. Pt currently with functional limitations due to the deficits listed below (see OT Problem List).  Pt will benefit from skilled OT to increase their safety and independence with ADL and functional mobility for ADL to facilitate discharge to venue listed below.       OT Assessment  Patient needs continued OT Services    Follow Up Recommendations  SNF    Barriers to Discharge Decreased caregiver support    Equipment Recommendations  None recommended by OT       Frequency  Min 2X/week    Precautions / Restrictions Precautions Precautions: Fall Precaution Comments: trach with passy muir valve Restrictions Weight Bearing Restrictions: No       ADL  Eating/Feeding: Simulated;Independent Where Assessed - Eating/Feeding: Bed level Grooming: Simulated;Set up;Supervision/safety Where Assessed - Grooming: Supported sitting Upper Body Bathing: Simulated;Set  up;Supervision/safety Where Assessed - Upper Body Bathing: Unsupported sitting Lower Body Bathing: Simulated;Moderate assistance Where Assessed - Lower Body Bathing: Supported sit to stand Upper Body Dressing: Simulated;Set up;Supervision/safety Where Assessed - Upper Body Dressing: Unsupported sitting Lower Body Dressing: Simulated;Moderate assistance Where Assessed - Lower Body Dressing: Unsupported sit to stand Toilet Transfer: Simulated;Minimal assistance Toilet Transfer Method: Sit to stand Toilet Transfer Equipment:  (Bed>turn to recliner on her right) Toileting - Architect and Hygiene: Moderate assistance Where Assessed - Toileting Clothing Manipulation and Hygiene: Sit to stand from 3-in-1 or toilet Transfers/Ambulation Related to ADLs: Min A for sit<>stand    OT Diagnosis: Generalized weakness  OT Problem List: Decreased strength;Decreased activity tolerance;Impaired balance (sitting and/or standing);Obesity;Decreased knowledge of use of DME or AE OT Treatment Interventions: Self-care/ADL training;Balance training;DME and/or AE instruction;Patient/family education;Therapeutic activities   OT Goals(Current goals can be found in the care plan section) Acute Rehab OT Goals Patient Stated Goal: To go home OT Goal Formulation: With patient Time For Goal Achievement: 10/26/12 Potential to Achieve Goals: Good  Visit Information  Last OT Received On: 10/12/12 Assistance Needed: +2 PT/OT Co-Evaluation/Treatment: Yes History of Present Illness: This is a 72 year old female, with history of Obesity, OSA, Asthma/COPD, chronic respiratory failure, s/p Trach, on home O2 at 4L/Min, Chronic Cor Pulmonale, HTN, s/p CVA, Cardiomyopathy, CHF, EF 45% per 2D Echocardiogram 06/2012, CKD-4, Gout, Dyslipidemia, chronic anemia/Thrombocytopenia, Diet-controlled DM-2, GERD, OA, presenting with progressive shortness of breath over 2-3 days, unassociated with chest pain, fever or chills. She has  had a mild cough productive of whitish phlegm, as well as progressive bilateral LE swelling. Denies abdominal pain, vomiting or diarrhea. Daughter brought her to the ED today, because of worsening symptoms. In the ED, she was  administered iv Lasix and NTG infusion, with amelioration of symptoms.        Prior Functioning     Home Living Family/patient expects to be discharged to:: Private residence Living Arrangements: Children;Other relatives Available Help at Discharge: Family;Available PRN/intermittently Type of Home: House Home Access: Level entry Home Layout: One level Home Equipment: Walker - 2 wheels;Wheelchair - manual;Grab bars - toilet;Grab bars - tub/shower;Shower seat Additional Comments: Pt vague regarding home situation and ability for daughter to help her. Prior Function Level of Independence: Needs assistance Gait / Transfers Assistance Needed: pt primariliy uses w/c. short distance amb with RW with home PT ADL's / Homemaking Assistance Needed: assist with cooking and bathing. pt dresses self indep. Comments: Pt reluctant to provide information Communication Communication: No difficulties;Passy-Muir valve;Tracheostomy Dominant Hand: Right         Vision/Perception Vision - History Patient Visual Report: No change from baseline   Cognition  Cognition Arousal/Alertness: Awake/alert Behavior During Therapy: Flat affect Overall Cognitive Status: Within Functional Limits for tasks assessed    Extremity/Trunk Assessment Upper Extremity Assessment Upper Extremity Assessment: Overall WFL for tasks assessed Lower Extremity Assessment Lower Extremity Assessment: Generalized weakness     Mobility Bed Mobility Bed Mobility: Supine to Sit Supine to Sit: 4: Min guard;With rails;HOB elevated Details for Bed Mobility Assistance: Minguard for safety and needed extra time to complete Transfers Sit to Stand: 4: Min assist;From bed Stand to Sit: 4: Min assist;To  chair/3-in-1 Details for Transfer Assistance: (A) to initiate transfer and slowly descend to recliner        Balance Balance Balance Assessed: Yes Static Sitting Balance Static Sitting - Balance Support: Feet supported Static Sitting - Level of Assistance: 5: Stand by assistance   End of Session OT - End of Session Activity Tolerance:  (limited by not wanting to get up) Patient left: in chair;with call bell/phone within reach       [name] [phone] 10/12/2012, 11:12 AM

## 2012-10-13 LAB — RENAL FUNCTION PANEL
BUN: 54 mg/dL — ABNORMAL HIGH (ref 6–23)
CO2: 26 mEq/L (ref 19–32)
Chloride: 98 mEq/L (ref 96–112)
Creatinine, Ser: 3.16 mg/dL — ABNORMAL HIGH (ref 0.50–1.10)
GFR calc non Af Amer: 14 mL/min — ABNORMAL LOW (ref >90)
Potassium: 4.5 mEq/L (ref 3.5–5.1)

## 2012-10-13 LAB — GLUCOSE, CAPILLARY
Glucose-Capillary: 105 mg/dL — ABNORMAL HIGH (ref 70–99)
Glucose-Capillary: 100 mg/dL — ABNORMAL HIGH (ref 70–99)

## 2012-10-13 NOTE — Progress Notes (Deleted)
Pt is alert to self and place. Pt does not know why she is in the hospital. Pt is incontinent of bowel. Pt has a foley catheter. Pt has had no complaints of pain at this time. Will continue to monitor

## 2012-10-13 NOTE — Progress Notes (Signed)
Patient evaluated for long-term disease management services with [facility] Care Management Program. Spoke with Ms Zobrist at bedside who reports she is going home not snf. States she lives with her daughter Sunday Corn [phone]). Explained services to patient and she is agreeable to [facility] Care Management services. Consents signed. Obtained best contact numbers for patient and [facility] Care Management packet left at bedside. Patient will receive a post discharge transition of care call and will be evaluated for monthly home visits for assessments and for education.     Raiford Noble, RN,BSN, [facility] Liaison, [phone]

## 2012-10-13 NOTE — Progress Notes (Signed)
[facility] Progress Note [place] TEAM 1 - Stepdown/ICU TEAM   APRYL BRYMER BJY:[id number] DOB: Jul 21, 1940 DOA: 10/06/2012 PCP: Gwynneth Aliment, MD  Brief narrative: 72 year old female, with history of Obesity, OSA, Asthma/COPD, chronic respiratory failure s/p Trach, on home O2 at 4L/Min, Chronic Cor Pulmonale, HTN, s/p CVA, Cardiomyopathy, CHF (EF 45% per 2D Echocardiogram 06/2012), CKD-4, Gout, Dyslipidemia, chronic anemia/thrombocytopenia, diet-controlled DM-2, GERD, OA, who presented with progressive shortness of breath over 2-3 days, unassociated with chest pain, fever or chills. She had a mild cough productive of whitish phlegm, as well as progressive bilateral LE swelling. Denied abdominal pain, vomiting or diarrhea. Daughter brought her to the ED on 10/06/12, because of worsening symptoms. In the ED, she was administered iv Lasix and NTG infusion, with amelioration of symptoms.   Assessment/Plan:  Dyspnea/Respiratory failure, acute on chronic - Trach dependent with history of OSA/OHS, COPD/asthma, chronic LLL collapse, chronic combined CHF - Multifactorial, with volume overload as primary contributor - see other issues detailed below - Still seems to be mildly tachypneic.  Acute on Chronic CHF - systolic + R heart failure / mod pulm HTN - EF 45% with hypokinesis and grade 2 diastolic dysfunction per echo 0/8657 - 12 L net negative since admit - diuretic dosing per Cardiology/CHF Team -  Have started her on torsemide  60 mg daily. Continue Coreg, hydralazine & Imdur. No ACE inhibitor with significant CKD.  COPD Well compensated at present / no wheezing on exam  OHS/OSA  Trach dependent   Chronic LLL collapse Stable via CXR  CKD stage IV baseline Cr appears to be 2.5-2.8 - No ACEI - Nephrology following   Anemia of chronic disease in setting of CKD Hb 8.8 on admission - holding relatively steady at this time - no evidence of acute blood loss  DM Reasonably well  controlled at present - no change in tx plan today   GERD  Gout Well compensated at this time   HTN Well controlled at present. On clonidine, amlodipine, carvedilol & hydralazine.   Code Status: FULL Family Communication:  None  Disposition Plan: PT recommends SNF but pt and daugther refusing- prefer to go home- daughter not at home during the day- have told pt that she will need to be stable on her feet prior to going home. Per cardiology note, patient has had several admissions but no outpatient followup. She absolutely needs to have OP followup appointments with CHF clinic, nephrology and pulmonology before she leaves the hospital if she continues to decline SNF placement.  Consultants: Nephrology Cardiolgoy  Procedures:  Foley-discontinued  Antibiotics: none  DVT prophylaxis: SQ heparin  HPI/Subjective: Patient states that she still has some dyspnea but is improving. No chest pain.  Objective: Blood pressure 172/69, pulse 56, temperature 97.7 F (36.5 C), temperature source Oral, resp. rate 20, height 5\' 4"  (1.626 m), weight 77.928 kg (171 lb 12.8 oz), SpO2 98.00%.  Intake/Output Summary (Last 24 hours) at 10/13/12 0944 Last data filed at 10/13/12 8469  Gross per 24 hour  Intake    840 ml  Output   1020 ml  Net   -180 ml   Exam: General: Moderately built and overweight female patient lying in bed comfortably.  Lungs:  clear to auscultation anteriorly. Reduced breath sounds in the bases but otherwise clear to auscultation. Mild increased work of breathing. Tracheostomy +  Cardiovascular: Regular rate and rhythm without murmur gallop or rub normal S1 and S2. 1+ bilateral leg edema.  Abdomen: Nontender,  nondistended, soft, bowel sounds positive, no rebound, no ascites, no appreciable mass Extremities: No significant cyanosis, clubbing, or edema bilateral lower extremities CNS: Alert and oriented.  Data Reviewed: Basic Metabolic Panel:  Recent Labs Lab  10/08/12 0850 10/09/12 0330 10/10/12 0600 10/11/12 0500 10/12/12 0630 10/13/12 0445  NA 138 138 137 138 137 136  K 3.6 3.5 3.7 3.8 4.1 4.5  CL 101 99 98 99 99 98  CO2 29 30 30 28 29 26   GLUCOSE 101* 97 108* 104* 103* 103*  BUN 43* 43* 48* 50* 52* 54*  CREATININE 2.75* 2.85* 3.06* 3.10* 3.04* 3.16*  CALCIUM 9.5 9.2 9.1 9.1 9.1 9.4  MG 1.9 2.0 1.9 2.1  --  2.4  PHOS 3.4 3.8 3.8 4.2 4.3 4.3   Liver Function Tests:  Recent Labs Lab 10/07/12 0600  10/09/12 0330 10/10/12 0600 10/11/12 0500 10/12/12 0630 10/13/12 0445  AST 11  --   --   --   --   --   --   ALT 6  --   --   --   --   --   --   ALKPHOS 63  --   --   --   --   --   --   BILITOT 0.2*  --   --   --   --   --   --   PROT 5.6*  --   --   --   --   --   --   ALBUMIN 2.8*  < > 2.6* 2.6* 2.6* 2.7* 2.8*  < > = values in this interval not displayed. CBC:  Recent Labs Lab 10/08/12 0420 10/08/12 0850 10/09/12 0330 10/10/12 0600 10/11/12 0500  WBC 7.3 7.7 7.5 9.2 8.7  HGB 8.2* 9.1* 8.5* 8.6* 8.4*  HCT 26.4* 28.3* 27.4* 27.4* 26.7*  MCV 81.5 80.4 81.8 80.8 81.7  PLT 194 168 183 186 180   Cardiac Enzymes:  Recent Labs Lab 10/06/12 1432 10/06/12 2300  TROPONINI <0.30 <0.30   BNP (last 3 results)  Recent Labs  10/09/12 1045 10/09/12 2302 10/11/12 0500  PROBNP 3863.0* 3450.0* 2714.0*   CBG:  Recent Labs Lab 10/12/12 0630 10/12/12 1054 10/12/12 1605 10/12/12 2102 10/13/12 0647  GLUCAP 95 147* 102* 134* 105*    Recent Results (from the past 240 hour(s))  MRSA PCR SCREENING     Status: None   Collection Time    10/06/12  1:09 PM      Result Value Range Status   MRSA by PCR NEGATIVE  NEGATIVE Final   Comment:            The GeneXpert MRSA Assay (FDA     approved for NASAL specimens     only), is one component of a     comprehensive MRSA colonization     surveillance program. It is not     intended to diagnose MRSA     infection nor to guide or     monitor treatment for     MRSA  infections.     Studies:  Recent x-ray studies have been reviewed in detail by the Attending Physician  Scheduled Meds:  Scheduled Meds: . albuterol  2.5 mg Nebulization QID  . amLODipine  10 mg Oral Daily  . antiseptic oral rinse  15 mL Mouth Rinse Daily  . arformoterol  15 mcg Nebulization BID  . aspirin  81 mg Oral Daily  . atorvastatin  40 mg Oral q1800  . budesonide  0.25 mg Nebulization BID  . busPIRone  5 mg Oral BID  . carvedilol  12.5 mg Oral BID WC  . cloNIDine  0.1 mg Oral BID  . clopidogrel  75 mg Oral Q breakfast  . dextromethorphan-guaiFENesin  1 tablet Oral BID  . ferrous sulfate  325 mg Oral BID  . heparin  5,000 Units Subcutaneous Q8H  . hydrALAZINE  100 mg Oral TID  . insulin aspart  0-9 Units Subcutaneous TID WC  . ipratropium  0.5 mg Nebulization QID  . isosorbide mononitrate  60 mg Oral Daily  . metoCLOPramide  5 mg Oral QID  . pantoprazole  40 mg Oral Daily  . sertraline  100 mg Oral QHS  . torsemide  60 mg Oral Daily  . vitamin C  500 mg Oral Daily    Time spent on care of this patient: 25 mins   [facility], MD  [facility] Office  [phone] Pager [phone]   If 7PM-7AM, please contact night-coverage [email or web address] Password [facility] 10/13/2012, 9:44 AM   LOS: 7 days

## 2012-10-13 NOTE — Progress Notes (Signed)
Physical Therapy Treatment Patient Details Name: [name] MRN: [id number] DOB: [birth year] Today's Date: 10/13/2012 Time: 1191-4782 PT Time Calculation (min): 16 min  PT Assessment / Plan / Recommendation  History of Present Illness This is a 72 year old female, with history of Obesity, OSA, Asthma/COPD, chronic respiratory failure, s/p Trach, on home O2 at 4L/Min, Chronic Cor Pulmonale, HTN, s/p CVA, Cardiomyopathy, CHF, EF 45% per 2D Echocardiogram 06/2012, CKD-4, Gout, Dyslipidemia, chronic anemia/Thrombocytopenia, Diet-controlled DM-2, GERD, OA, presenting with progressive shortness of breath over 2-3 days, unassociated with chest pain, fever or chills. She has had a mild cough productive of whitish phlegm, as well as progressive bilateral LE swelling. Denies abdominal pain, vomiting or diarrhea. Daughter brought her to the ED today, because of worsening symptoms. In the ED, she was administered iv Lasix and NTG infusion, with amelioration of symptoms.    PT Comments   Pt requires encouragement to participate in therapy.  Pt states she lives with her daughter but her daughter works during the day.  At this time cont to recommend SNF as safest d/c option once medically ready however pt is refusing per chart review  Follow Up Recommendations  SNF     Does the patient have the potential to tolerate intense rehabilitation     Barriers to Discharge        Equipment Recommendations  None recommended by PT    Recommendations for Other Services    Frequency Min 3X/week   Progress towards PT Goals Progress towards PT goals: Progressing toward goals  Plan Current plan remains appropriate    Precautions / Restrictions Precautions Precaution Comments: trach with passy muir valve Restrictions Weight Bearing Restrictions: No   Pertinent Vitals/Pain +SOB with minimal activity.  Pt on 4L 02 trach     Mobility  Bed Mobility Bed Mobility: Supine to Sit;Sitting - Scoot to Edge of  Bed Supine to Sit: 5: Supervision Sitting - Scoot to Edge of Bed: 5: Supervision Details for Bed Mobility Assistance: Pt able to complete without physical (A) but requires encouragement Transfers Transfers: Sit to Stand;Stand to Sit Sit to Stand: 4: Min guard;With upper extremity assist;From bed Stand to Sit: 4: Min guard;With upper extremity assist;With armrests;To chair/3-in-1 Details for Transfer Assistance: cues for safe hand placement Ambulation/Gait Ambulation/Gait Assistance: 4: Min assist Ambulation Distance (Feet): 8 Feet Assistive device: Rolling walker Ambulation/Gait Assistance Details: (A) for balance.  Encouragement to increase distance but pt refusing Gait Pattern: Decreased stride length;Shuffle;Trunk flexed;Narrow base of support;Step-through pattern Gait velocity: decreased Stairs: No Wheelchair Mobility Wheelchair Mobility: No      PT Goals (current goals can now be found in the care plan section) Acute Rehab PT Goals Patient Stated Goal: To go home PT Goal Formulation: With patient Time For Goal Achievement: 10/19/12 Potential to Achieve Goals: Good  Visit Information  Last PT Received On: 10/13/12 Assistance Needed: +1 (to increase ambulation distance) History of Present Illness: This is a 72 year old female, with history of Obesity, OSA, Asthma/COPD, chronic respiratory failure, s/p Trach, on home O2 at 4L/Min, Chronic Cor Pulmonale, HTN, s/p CVA, Cardiomyopathy, CHF, EF 45% per 2D Echocardiogram 06/2012, CKD-4, Gout, Dyslipidemia, chronic anemia/Thrombocytopenia, Diet-controlled DM-2, GERD, OA, presenting with progressive shortness of breath over 2-3 days, unassociated with chest pain, fever or chills. She has had a mild cough productive of whitish phlegm, as well as progressive bilateral LE swelling. Denies abdominal pain, vomiting or diarrhea. Daughter brought her to the ED today, because of worsening symptoms. In the  ED, she was administered iv Lasix and NTG  infusion, with amelioration of symptoms.     Subjective Data  Patient Stated Goal: To go home   Cognition  Cognition Arousal/Alertness: Awake/alert Behavior During Therapy: Flat affect Overall Cognitive Status: Within Functional Limits for tasks assessed    Balance     End of Session PT - End of Session Equipment Utilized During Treatment: Gait belt;Oxygen Activity Tolerance: Patient limited by fatigue Patient left: in chair;with call bell/phone within reach Nurse Communication: Mobility status   GP     Lara Mulch 10/13/2012, 2:54 PM  Verdell Face, PTA [phone] 10/13/2012

## 2012-10-13 NOTE — Progress Notes (Signed)
I have seen and examined this patient and agree with plan as outlined by Dr. Virgina Organ.  Pt is 12 L negative and is having some prerenal azotemia at the present time and feel that she is being too aggressively diuresed as outlined yesterday.  She is not a suitable dialysis candidate given her trach, severe COPD, and debilitated state.  Agree with [facility] but may benefit from palliative care consult.  Will sign off as we have nothing further to add.  Again I would not offer RRT for this unfortunate woman. Nichol Ator A,MD 10/13/2012 1:02 PM

## 2012-10-13 NOTE — Progress Notes (Signed)
Pt is alert and oriented x4. Pt has had no complaints of pain. Pt is able to get up ad lib to the [facility]. Will continue to monitor

## 2012-10-13 NOTE — Progress Notes (Signed)
[name] ROUNDING NOTE   Subjective:   [name] was seen and examined at bedside this morning.  She reports feeling better this morning and denies any chest pain, N/V/D, abdominal pain, fever, or chills at this time. Her foley is out and she reports urination every 2-3 hours approximately and reports good flow.  She does have an intermittent cough.  She does not wish to be discharged to SNF and would like to go back home.    Objective:  Vital signs in last 24 hours:  Temp:  [97.7 F (36.5 C)-98.1 F (36.7 C)] 97.7 F (36.5 C) (08/22 0509) Pulse Rate:  [56-61] 56 (08/22 0509) Resp:  [18-20] 20 (08/22 0509) BP: (120-172)/(52-69) 172/69 mmHg (08/22 0509) SpO2:  [97 %-100 %] 98 % (08/22 0509) FiO2 (%):  [28 %] 28 % (08/22 0430) Weight:  [171 lb 1.2 oz (77.6 kg)-171 lb 12.8 oz (77.928 kg)] 171 lb 12.8 oz (77.928 kg) (08/22 0509)  Weight change: 0 lb (0 kg) Filed Weights   10/11/12 1645 10/12/12 0817 10/13/12 0509  Weight: 171 lb 1.2 oz (77.6 kg) 171 lb 1.2 oz (77.6 kg) 171 lb 12.8 oz (77.928 kg)   Intake/Output: I/O last 3 completed shifts: In: 720 [P.O.:720] Out: 2145 [Urine:2145]  Physical Exam:  Vitals reviewed.  General:lying in bed, trach in place HEENT: PERRL, EOMI  Cardiac: RRR  Pulm: diffuse expiratory wheezing and coarse breath sounds b/l  Abd: soft, obese, nontender, nondistended, BS present  Ext: warm and well perfused, mild lower extremity edema, improved Neuro: alert, answering some questions, moving all 4 extremities Skin: wrinkled  Basic Metabolic Panel:  Recent Labs Lab 10/08/12 0850 10/09/12 0330 10/10/12 0600 10/11/12 0500 10/12/12 0630 10/13/12 0445  NA 138 138 137 138 137 136  K 3.6 3.5 3.7 3.8 4.1 4.5  CL 101 99 98 99 99 98  CO2 29 30 30 28 29 26   GLUCOSE 101* 97 108* 104* 103* 103*  BUN 43* 43* 48* 50* 52* 54*  CREATININE 2.75* 2.85* 3.06* 3.10* 3.04* 3.16*  CALCIUM 9.5 9.2 9.1 9.1 9.1 9.4  MG 1.9 2.0 1.9 2.1  --  2.4   PHOS 3.4 3.8 3.8 4.2 4.3 4.3   Liver Function Tests:  Recent Labs Lab 10/07/12 0600  10/09/12 0330 10/10/12 0600 10/11/12 0500 10/12/12 0630 10/13/12 0445  AST 11  --   --   --   --   --   --   ALT 6  --   --   --   --   --   --   ALKPHOS 63  --   --   --   --   --   --   BILITOT 0.2*  --   --   --   --   --   --   PROT 5.6*  --   --   --   --   --   --   ALBUMIN 2.8*  < > 2.6* 2.6* 2.6* 2.7* 2.8*  < > = values in this interval not displayed. CBC:  Recent Labs Lab 10/08/12 0420 10/08/12 0850 10/09/12 0330 10/10/12 0600 10/11/12 0500  WBC 7.3 7.7 7.5 9.2 8.7  HGB 8.2* 9.1* 8.5* 8.6* 8.4*  HCT 26.4* 28.3* 27.4* 27.4* 26.7*  MCV 81.5 80.4 81.8 80.8 81.7  PLT 194 168 183 186 180   Cardiac Enzymes:  Recent Labs Lab 10/06/12 0830 10/06/12 1432 10/06/12 2300  TROPONINI <0.30 <0.30 <0.30   BNP  Component Value Date/Time   PROBNP 2714.0* 10/11/2012 0500   CBG:  Recent Labs Lab 10/11/12 2118 10/12/12 0630 10/12/12 1054 10/12/12 1605 10/12/12 2102  GLUCAP 133* 95 147* 102* 134*   Microbiology: Results for orders placed during the hospital encounter of 10/06/12  MRSA PCR SCREENING     Status: None   Collection Time    10/06/12  1:09 PM      Result Value Range Status   MRSA by PCR NEGATIVE  NEGATIVE Final   Comment:            The GeneXpert MRSA Assay (FDA     approved for NASAL specimens     only), is one component of a     comprehensive MRSA colonization     surveillance program. It is not     intended to diagnose MRSA     infection nor to guide or     monitor treatment for     MRSA infections.    Medications:     . albuterol  2.5 mg Nebulization QID  . amLODipine  10 mg Oral Daily  . antiseptic oral rinse  15 mL Mouth Rinse Daily  . arformoterol  15 mcg Nebulization BID  . aspirin  81 mg Oral Daily  . atorvastatin  40 mg Oral q1800  . budesonide  0.25 mg Nebulization BID  . busPIRone  5 mg Oral BID  . carvedilol  12.5 mg Oral BID WC   . cloNIDine  0.1 mg Oral BID  . clopidogrel  75 mg Oral Q breakfast  . dextromethorphan-guaiFENesin  1 tablet Oral BID  . ferrous sulfate  325 mg Oral BID  . heparin  5,000 Units Subcutaneous Q8H  . hydrALAZINE  100 mg Oral TID  . insulin aspart  0-9 Units Subcutaneous TID WC  . ipratropium  0.5 mg Nebulization QID  . isosorbide mononitrate  60 mg Oral Daily  . metoCLOPramide  5 mg Oral QID  . pantoprazole  40 mg Oral Daily  . sertraline  100 mg Oral QHS  . torsemide  60 mg Oral Daily  . vitamin C  500 mg Oral Daily   acetaminophen, acetaminophen, albuterol, LORazepam, morphine injection, ondansetron (ZOFRAN) IV, ondansetron, oxyCODONE, sodium chloride  Assessment/ Plan:  [name] is a 72 y.o. female with extensive PMHx significant for CKD stage 4 (follow with Dr. Hyman Hopes), CHF EF 45% with hypokinesis and grade 2 diastolic dysfunction (echo 06/2012), and COPD/asthma with OSA and trach and oxygen dependent admitted to [facility] on 10/06/2012 for acute on chronic respiratory failure.   CKD stage 4--on continued diuresis, Cr trending back up.  Cr on admission 2.76 (around baseline) and follows with Dr. Hyman Hopes. Hx of HTN and baseline Cr appears to be 2.5-2.8. Urine protein to creatinine ration 3.68. Currently on lasix.   Recent Labs Lab 10/09/12 0330 10/10/12 0600 10/11/12 0500 10/12/12 0630 10/13/12 0445  CREATININE 2.85* 3.06* 3.10* 3.04* 3.16*   -trend renal function  -daily weights,weight down to 171lb -strict I/Os: -12.5 L since admission -consider resuming home torsemide but consider slightly increased dose given volume overload this admission  Acute on chronic respiratory failure--trach dependent with hx of OSA, COPD/asthma, and o2 dependent on 4L [place] at home. ?secondary to acute decompensated CHF. Wheezing on physical exam. No clear infiltrate on cxr. VQ scan not suggestive of acute PE.  -management per primary team   Anemia of chronic disease in setting of CKD stage 4: Hb 8.8 on  admission.  Iron  panel 08/2012: Iron 164, Ferritin 35, TIBC 225, Folate 10.6, sat ratios 73%.  Received Aranesp x1 yesterday.    -trend Hb    CHF--EF 45% with hypokinesis and grade 2 diastolic dysfunction per echo 02/9145. Presenting with worsening SOB and lower extremity edema. Weight appears slightly up from baseline, to 183lb today and was 179lb on admission. Home regimen includes: norvasc, asa, lipitor, buspar, coreg, clonidine, plavix, hydralazine, IMDUR, and torsemide. On IV lasix at this time.  -management per primary team   Case discussed and patient seen with Dr. Arrie Aran.     LOS: 7 Signed: Darden Palmer, MD PGY-2, Internal Medicine Resident Pager: [phone]  10/13/2012,7:53 AM

## 2012-10-13 NOTE — Progress Notes (Signed)
Subjective: No CP  Breathing is fair. Objective: Filed Vitals:   10/12/12 2105 10/12/12 2330 10/13/12 0430 10/13/12 0509  BP: 135/52   172/69  Pulse: 60 58 60 56  Temp: 98.1 F (36.7 C)   97.7 F (36.5 C)  TempSrc: Oral   Oral  Resp: 20 18 20 20   Height:      Weight:    171 lb 12.8 oz (77.928 kg)  SpO2: 98% 98% 98% 98%   Weight change: 0 lb (0 kg)  Intake/Output Summary (Last 24 hours) at 10/13/12 8469 Last data filed at 10/13/12 0510  Gross per 24 hour  Intake    720 ml  Output   1270 ml  Net   -550 ml   Net negative over 12 L  General: Alert, awake, oriented x3, in no acute distress Neck:  Unable to assess JVP due to trach Heart: Regular rate and rhythm, without murmurs, rubs, gallops.  Lungs: Rhonchi bilaterally Exemities:  No edema.   Neuro: Grossly intact, nonfocal.   Lab Results: Results for orders placed during the hospital encounter of 10/06/12 (from the past 24 hour(s))  GLUCOSE, CAPILLARY     Status: Abnormal   Collection Time    10/12/12 10:54 AM      Result Value Range   Glucose-Capillary 147 (*) 70 - 99 mg/dL   Comment 1 Notify RN    GLUCOSE, CAPILLARY     Status: Abnormal   Collection Time    10/12/12  4:05 PM      Result Value Range   Glucose-Capillary 102 (*) 70 - 99 mg/dL   Comment 1 Notify RN    GLUCOSE, CAPILLARY     Status: Abnormal   Collection Time    10/12/12  9:02 PM      Result Value Range   Glucose-Capillary 134 (*) 70 - 99 mg/dL   Comment 1 Notify RN     Comment 2 Documented in Chart    RENAL FUNCTION PANEL     Status: Abnormal   Collection Time    10/13/12  4:45 AM      Result Value Range   Sodium 136  135 - 145 mEq/L   Potassium 4.5  3.5 - 5.1 mEq/L   Chloride 98  96 - 112 mEq/L   CO2 26  19 - 32 mEq/L   Glucose, Bld 103 (*) 70 - 99 mg/dL   BUN 54 (*) 6 - 23 mg/dL   Creatinine, Ser 6.29 (*) 0.50 - 1.10 mg/dL   Calcium 9.4  8.4 - 52.8 mg/dL   Phosphorus 4.3  2.3 - 4.6 mg/dL   Albumin 2.8 (*) 3.5 - 5.2 g/dL   GFR calc  non Af Amer 14 (*) >90 mL/min   GFR calc Af Amer 16 (*) >90 mL/min  MAGNESIUM     Status: None   Collection Time    10/13/12  4:45 AM      Result Value Range   Magnesium 2.4  1.5 - 2.5 mg/dL    Studies/Results: @RISRSLT24 @  Medications: Reviewed   @PROBHOSP @  1.  Dyspnea  Improved  Patient says not at best.    2.  Acute on chronic CHF  Systolic and R sided CHF  LVEF 45% with Gr II diastolic dysfunction  Patinet has signif diuresis  Switch to po diuretics at higher dose today  Follow wts.  3.  COPD with chronic LLL collapse  4.  OSA  Trach dependent  5.  CKD Stage 4  Cr up a little bit  Follow on PO  WIll make sure that patinet has close f/u after d/c.  LOS: 7 days   Dietrich Pates 10/13/2012, 7:52 AM

## 2012-10-14 LAB — CBC
HCT: 27.6 % — ABNORMAL LOW (ref 36.0–46.0)
MCH: 26.2 pg (ref 26.0–34.0)
MCV: 82.1 fL (ref 78.0–100.0)
Platelets: 222 10*3/uL (ref 150–400)
RBC: 3.36 MIL/uL — ABNORMAL LOW (ref 3.87–5.11)
WBC: 9.6 10*3/uL (ref 4.0–10.5)

## 2012-10-14 LAB — RENAL FUNCTION PANEL
Albumin: 2.6 g/dL — ABNORMAL LOW (ref 3.5–5.2)
BUN: 58 mg/dL — ABNORMAL HIGH (ref 6–23)
Chloride: 97 mEq/L (ref 96–112)
Creatinine, Ser: 2.92 mg/dL — ABNORMAL HIGH (ref 0.50–1.10)
Glucose, Bld: 96 mg/dL (ref 70–99)

## 2012-10-14 LAB — GLUCOSE, CAPILLARY
Glucose-Capillary: 106 mg/dL — ABNORMAL HIGH (ref 70–99)
Glucose-Capillary: 106 mg/dL — ABNORMAL HIGH (ref 70–99)
Glucose-Capillary: 92 mg/dL (ref 70–99)
Glucose-Capillary: 93 mg/dL (ref 70–99)

## 2012-10-14 NOTE — Progress Notes (Signed)
Respiratory exchange even and unlabored. Utilizing Passimiur valve without difficulty.

## 2012-10-14 NOTE — Progress Notes (Signed)
[facility] Progress Note [place] TEAM 1 - Stepdown/ICU TEAM   [name] ZOX:[id number] DOB: 1940-08-01 DOA: 10/06/2012 PCP: Gwynneth Aliment, MD  Brief narrative: 72 year old female, with history of Obesity, OSA, Asthma/COPD, chronic respiratory failure s/p Trach, on home O2 at 4L/Min, Chronic Cor Pulmonale, HTN, s/p CVA, Cardiomyopathy, CHF (EF 45% per 2D Echocardiogram 06/2012), CKD-4, Gout, Dyslipidemia, chronic anemia/thrombocytopenia, diet-controlled DM-2, GERD, OA, who presented with progressive shortness of breath over 2-3 days, unassociated with chest pain, fever or chills. She had a mild cough productive of whitish phlegm, as well as progressive bilateral LE swelling. Denied abdominal pain, vomiting or diarrhea. Daughter brought her to the ED on 10/06/12, because of worsening symptoms. In the ED, she was administered iv Lasix and NTG infusion, with amelioration of symptoms.   Assessment/Plan:  Dyspnea/Respiratory failure, acute on chronic - Trach dependent with history of OSA/OHS, COPD/asthma, chronic LLL collapse, chronic combined CHF - Multifactorial, with volume overload as primary contributor - see other issues detailed below - Still seems to be mildly tachypneic. Says breathing is 50% better.  Acute on Chronic CHF - systolic + R heart failure / mod pulm HTN - EF 45% with hypokinesis and grade 2 diastolic dysfunction per echo 09/1189 - 12 L net negative since admit - diuretic dosing per Cardiology/CHF Team -  Have started her on torsemide 60 mg daily.      Continue Coreg, hydralazine & Imdur. No ACE inhibitor with significant CKD.  COPD Well compensated at present / no wheezing on exam  OHS/OSA  Trach dependent   Chronic LLL collapse Stable via CXR  CKD stage IV baseline Cr appears to be 2.5-2.8 - No ACEI - Nephrology following   Anemia of chronic disease in setting of CKD Hb 8.8 on admission - holding relatively steady at this time - no evidence of acute  blood loss  DM Reasonably well controlled at present - no change in tx plan today   GERD  Gout Well compensated at this time   HTN Well controlled at present. On clonidine, amlodipine, carvedilol & hydralazine.   Code Status: FULL Family Communication:  Left message for daughter to call back- plan to discuss palliative consult for GOC. Disposition Plan: PT recommends SNF but pt and daugther refusing- prefer to go home- daughter not at home during the day- have told pt that she will need to be stable on her feet prior to going home. Per cardiology note, patient has had several admissions but no outpatient followup. She absolutely needs to have OP followup appointments with CHF clinic, nephrology and pulmonology before she leaves the hospital if she continues to decline SNF placement.  Consultants: Nephrology Cardiolgoy  Procedures:  Foley-discontinued  Antibiotics: none  DVT prophylaxis: SQ heparin  HPI/Subjective: Dyspnea 50 % better.  Objective: Blood pressure 164/70, pulse 53, temperature 98.9 F (37.2 C), temperature source Oral, resp. rate 16, height 5\' 4"  (1.626 m), weight 77.747 kg (171 lb 6.4 oz), SpO2 100.00%.  Intake/Output Summary (Last 24 hours) at 10/14/12 1352 Last data filed at 10/14/12 1100  Gross per 24 hour  Intake    940 ml  Output   1125 ml  Net   -185 ml   Exam: General: Moderately built and overweight female patient lying in bed comfortably.  Lungs:  clear to auscultation anteriorly. Reduced breath sounds in the bases but otherwise clear to auscultation. Mild increased work of breathing. Tracheostomy +  Cardiovascular: Regular rate and rhythm without murmur gallop or rub  normal S1 and S2. 1+ bilateral leg edema. Tele: SB 50's-SR in 60's. Abdomen: Nontender, nondistended, soft, bowel sounds positive, no rebound, no ascites, no appreciable mass Extremities: No significant cyanosis, clubbing, or edema bilateral lower extremities CNS: Alert and  oriented.  Data Reviewed: Basic Metabolic Panel:  Recent Labs Lab 10/08/12 0850 10/09/12 0330 10/10/12 0600 10/11/12 0500 10/12/12 0630 10/13/12 0445 10/14/12 0445  NA 138 138 137 138 137 136 134*  K 3.6 3.5 3.7 3.8 4.1 4.5 4.8  CL 101 99 98 99 99 98 97  CO2 29 30 30 28 29 26 26   GLUCOSE 101* 97 108* 104* 103* 103* 96  BUN 43* 43* 48* 50* 52* 54* 58*  CREATININE 2.75* 2.85* 3.06* 3.10* 3.04* 3.16* 2.92*  CALCIUM 9.5 9.2 9.1 9.1 9.1 9.4 9.2  MG 1.9 2.0 1.9 2.1  --  2.4  --   PHOS 3.4 3.8 3.8 4.2 4.3 4.3 4.0   Liver Function Tests:  Recent Labs Lab 10/10/12 0600 10/11/12 0500 10/12/12 0630 10/13/12 0445 10/14/12 0445  ALBUMIN 2.6* 2.6* 2.7* 2.8* 2.6*   CBC:  Recent Labs Lab 10/08/12 0850 10/09/12 0330 10/10/12 0600 10/11/12 0500 10/14/12 0445  WBC 7.7 7.5 9.2 8.7 9.6  HGB 9.1* 8.5* 8.6* 8.4* 8.8*  HCT 28.3* 27.4* 27.4* 26.7* 27.6*  MCV 80.4 81.8 80.8 81.7 82.1  PLT 168 183 186 180 222   Cardiac Enzymes: No results found for this basename: CKTOTAL, CKMB, CKMBINDEX, TROPONINI,  in the last 168 hours BNP (last 3 results)  Recent Labs  10/09/12 1045 10/09/12 2302 10/11/12 0500  PROBNP 3863.0* 3450.0* 2714.0*   CBG:  Recent Labs Lab 10/13/12 1107 10/13/12 1556 10/13/12 2116 10/14/12 0713 10/14/12 1116  GLUCAP 100* 91 118* 106* 106*    Recent Results (from the past 240 hour(s))  MRSA PCR SCREENING     Status: None   Collection Time    10/06/12  1:09 PM      Result Value Range Status   MRSA by PCR NEGATIVE  NEGATIVE Final   Comment:            The GeneXpert MRSA Assay (FDA     approved for NASAL specimens     only), is one component of a     comprehensive MRSA colonization     surveillance program. It is not     intended to diagnose MRSA     infection nor to guide or     monitor treatment for     MRSA infections.     Studies:  Recent x-ray studies have been reviewed in detail by the Attending Physician  Scheduled  Meds:  Scheduled Meds: . albuterol  2.5 mg Nebulization QID  . amLODipine  10 mg Oral Daily  . antiseptic oral rinse  15 mL Mouth Rinse Daily  . arformoterol  15 mcg Nebulization BID  . aspirin  81 mg Oral Daily  . atorvastatin  40 mg Oral q1800  . budesonide  0.25 mg Nebulization BID  . busPIRone  5 mg Oral BID  . carvedilol  12.5 mg Oral BID WC  . cloNIDine  0.1 mg Oral BID  . clopidogrel  75 mg Oral Q breakfast  . dextromethorphan-guaiFENesin  1 tablet Oral BID  . ferrous sulfate  325 mg Oral BID  . heparin  5,000 Units Subcutaneous Q8H  . hydrALAZINE  100 mg Oral TID  . insulin aspart  0-9 Units Subcutaneous TID WC  . ipratropium  0.5 mg Nebulization  QID  . isosorbide mononitrate  60 mg Oral Daily  . metoCLOPramide  5 mg Oral QID  . pantoprazole  40 mg Oral Daily  . sertraline  100 mg Oral QHS  . torsemide  60 mg Oral Daily  . vitamin C  500 mg Oral Daily    Time spent on care of this patient: 25 mins   [facility], MD  [facility] Office  [phone] Pager [phone]   If 7PM-7AM, please contact night-coverage [email or web address] Password [email or web address] 10/14/2012, 1:52 PM   LOS: 8 days

## 2012-10-14 NOTE — Progress Notes (Signed)
Subjective: No CP  Breathing is fair. Objective: Filed Vitals:   10/14/12 0507 10/14/12 0851 10/14/12 0857 10/14/12 1059  BP: 173/68   164/70  Pulse: 59  61   Temp: 98.9 F (37.2 C)     TempSrc: Oral     Resp: 20  16   Height:      Weight: 171 lb 6.4 oz (77.747 kg)     SpO2: 100% 100% 100%    Weight change: 5.2 oz (0.147 kg)  Intake/Output Summary (Last 24 hours) at 10/14/12 1202 Last data filed at 10/14/12 1100  Gross per 24 hour  Intake   1420 ml  Output   1325 ml  Net     95 ml   Net negative over 12 L  General: Alert, awake, oriented x3, in no acute distress Neck:  Unable to assess JVP due to trach Heart: Regular rate and rhythm, without murmurs, rubs, gallops.  Lungs: Rhonchi bilaterally Exemities:  No edema.   Neuro: Grossly intact, nonfocal.   Lab Results: Results for orders placed during the hospital encounter of 10/06/12 (from the past 24 hour(s))  GLUCOSE, CAPILLARY     Status: None   Collection Time    10/13/12  3:56 PM      Result Value Range   Glucose-Capillary 91  70 - 99 mg/dL  GLUCOSE, CAPILLARY     Status: Abnormal   Collection Time    10/13/12  9:16 PM      Result Value Range   Glucose-Capillary 118 (*) 70 - 99 mg/dL   Comment 1 Notify RN     Comment 2 Documented in Chart    RENAL FUNCTION PANEL     Status: Abnormal   Collection Time    10/14/12  4:45 AM      Result Value Range   Sodium 134 (*) 135 - 145 mEq/L   Potassium 4.8  3.5 - 5.1 mEq/L   Chloride 97  96 - 112 mEq/L   CO2 26  19 - 32 mEq/L   Glucose, Bld 96  70 - 99 mg/dL   BUN 58 (*) 6 - 23 mg/dL   Creatinine, Ser 9.52 (*) 0.50 - 1.10 mg/dL   Calcium 9.2  8.4 - 84.1 mg/dL   Phosphorus 4.0  2.3 - 4.6 mg/dL   Albumin 2.6 (*) 3.5 - 5.2 g/dL   GFR calc non Af Amer 15 (*) >90 mL/min   GFR calc Af Amer 17 (*) >90 mL/min  CBC     Status: Abnormal   Collection Time    10/14/12  4:45 AM      Result Value Range   WBC 9.6  4.0 - 10.5 K/uL   RBC 3.36 (*) 3.87 - 5.11 MIL/uL   Hemoglobin 8.8 (*) 12.0 - 15.0 g/dL   HCT 32.4 (*) 40.1 - 02.7 %   MCV 82.1  78.0 - 100.0 fL   MCH 26.2  26.0 - 34.0 pg   MCHC 31.9  30.0 - 36.0 g/dL   RDW 25.3 (*) 66.4 - 40.3 %   Platelets 222  150 - 400 K/uL  GLUCOSE, CAPILLARY     Status: Abnormal   Collection Time    10/14/12  7:13 AM      Result Value Range   Glucose-Capillary 106 (*) 70 - 99 mg/dL  GLUCOSE, CAPILLARY     Status: Abnormal   Collection Time    10/14/12 11:16 AM      Result Value Range   Glucose-Capillary  106 (*) 70 - 99 mg/dL    Studies/Results:   Medications: Reviewed  1.  Dyspnea  Improved  Patient says not at best.    2.  Acute on chronic CHF  Systolic and R sided CHF  LVEF 45% with Gr II diastolic dysfunction  Continue coreg 12.5 BID,  hydralizine 100 TID,  Imdur 60 mg a day, torsemide 60 mg a day.   3.  COPD with chronic LLL collapse  4.  OSA  Trach dependent  5.  CKD Stage 4  Cr up a little bit  Follow on PO  6. HTN:  Continue current meds.  WIll make sure that patinet has close f/u after d/c.    LOS: 8 days   Elyn Aquas. 10/14/2012, 12:02 PM

## 2012-10-15 LAB — GLUCOSE, CAPILLARY
Glucose-Capillary: 89 mg/dL (ref 70–99)
Glucose-Capillary: 98 mg/dL (ref 70–99)

## 2012-10-15 LAB — BASIC METABOLIC PANEL
Calcium: 8.9 mg/dL (ref 8.4–10.5)
GFR calc Af Amer: 17 mL/min — ABNORMAL LOW (ref >90)
GFR calc non Af Amer: 14 mL/min — ABNORMAL LOW (ref >90)
Glucose, Bld: 96 mg/dL (ref 70–99)
Potassium: 4.6 mEq/L (ref 3.5–5.1)
Sodium: 135 mEq/L (ref 135–145)

## 2012-10-15 MED ORDER — ASPIRIN 81 MG PO CHEW
CHEWABLE_TABLET | ORAL | Status: AC
  Filled 2012-10-15: qty 1

## 2012-10-15 NOTE — Progress Notes (Signed)
Subjective: No CP  Breathing is fair. Objective: Filed Vitals:   10/15/12 0438 10/15/12 0543 10/15/12 0847 10/15/12 1048  BP:  134/62  149/59  Pulse: 54 54 55 52  Temp:  98 F (36.7 C)    TempSrc:  Oral    Resp: 16 18 18 20   Height:      Weight:  156 lb 1.4 oz (70.8 kg)    SpO2: 100% 97% 100%    Weight change: -15 lb 5 oz (-6.947 kg)  Intake/Output Summary (Last 24 hours) at 10/15/12 1108 Last data filed at 10/15/12 0858  Gross per 24 hour  Intake   1080 ml  Output   2375 ml  Net  -1295 ml   Net negative over 12 L  General: Alert, awake, oriented x3, in no acute distress Neck:  Unable to assess JVP due to trach Heart: Regular rate and rhythm, without murmurs, rubs, gallops.  Lungs: Rhonchi bilaterally Exemities:  No edema.   Neuro: Grossly intact, nonfocal.   Lab Results: Results for orders placed during the hospital encounter of 10/06/12 (from the past 24 hour(s))  GLUCOSE, CAPILLARY     Status: Abnormal   Collection Time    10/14/12 11:16 AM      Result Value Range   Glucose-Capillary 106 (*) 70 - 99 mg/dL  GLUCOSE, CAPILLARY     Status: None   Collection Time    10/14/12  4:47 PM      Result Value Range   Glucose-Capillary 92  70 - 99 mg/dL  GLUCOSE, CAPILLARY     Status: None   Collection Time    10/14/12  9:26 PM      Result Value Range   Glucose-Capillary 93  70 - 99 mg/dL   Comment 1 Notify RN    BASIC METABOLIC PANEL     Status: Abnormal   Collection Time    10/15/12  5:15 AM      Result Value Range   Sodium 135  135 - 145 mEq/L   Potassium 4.6  3.5 - 5.1 mEq/L   Chloride 98  96 - 112 mEq/L   CO2 28  19 - 32 mEq/L   Glucose, Bld 96  70 - 99 mg/dL   BUN 62 (*) 6 - 23 mg/dL   Creatinine, Ser 1.61 (*) 0.50 - 1.10 mg/dL   Calcium 8.9  8.4 - 09.6 mg/dL   GFR calc non Af Amer 14 (*) >90 mL/min   GFR calc Af Amer 17 (*) >90 mL/min  GLUCOSE, CAPILLARY     Status: None   Collection Time    10/15/12  6:05 AM      Result Value Range   Glucose-Capillary 89  70 - 99 mg/dL    Studies/Results:   Medications: Reviewed  1.  Dyspnea  Improved  Patient says not at best.    2.  Acute on chronic CHF  Systolic and R sided CHF  LVEF 45% with Gr II diastolic dysfunction  Continue coreg 12.5 BID,  hydralizine 100 TID,  Imdur 60 mg a day, torsemide 60 mg a day.  She really needs to go to a SNF but she and her daughter refuse.  3.  COPD with chronic LLL collapse  4.  OSA  Trach dependent  5.  CKD Stage 4  Cr up a little bit  Follow on PO  6. HTN:  Continue current meds.  WIll make sure that patinet has close f/u after d/c.  LOS: 9 days   Elyn Aquas. 10/15/2012, 11:08 AM

## 2012-10-15 NOTE — Progress Notes (Signed)
[facility] Progress Note [place] TEAM 1 - Stepdown/ICU TEAM   [name] ZOX:[id number] DOB: 1940-08-27 DOA: 10/06/2012 PCP: Gwynneth Aliment, MD  Brief narrative: 72 year old female, with history of Obesity, OSA, Asthma/COPD, chronic respiratory failure s/p Trach, on home O2 at 4L/Min, Chronic Cor Pulmonale, HTN, s/p CVA, Cardiomyopathy, CHF (EF 45% per 2D Echocardiogram 06/2012), CKD-4, Gout, Dyslipidemia, chronic anemia/thrombocytopenia, diet-controlled DM-2, GERD, OA, who presented with progressive shortness of breath over 2-3 days, unassociated with chest pain, fever or chills. She had a mild cough productive of whitish phlegm, as well as progressive bilateral LE swelling. Denied abdominal pain, vomiting or diarrhea. Daughter brought her to the ED on 10/06/12, because of worsening symptoms. In the ED, she was administered iv Lasix and NTG infusion, with amelioration of symptoms.   Assessment/Plan:  Dyspnea/Respiratory failure, acute on chronic - Trach dependent with history of OSA/OHS, COPD/asthma, chronic LLL collapse, chronic  combined CHF - Multifactorial, with volume overload as primary contributor - see other issues detailed below - Says breathing is getting better. Looks comfortable.  Acute on Chronic CHF - systolic + R heart failure / mod pulm HTN - EF 45% with hypokinesis and grade 2 diastolic dysfunction per echo 09/1189 - 12 L net negative since admit - diuretic dosing per Cardiology/CHF Team  -   Have started her on torsemide 60 mg daily. Continue Coreg, hydralazine & Imdur. No ACE  inhibitor with significant CKD.  COPD Well compensated at present / no wheezing on exam  OHS/OSA  Trach dependent   Chronic LLL collapse Stable via CXR  CKD stage IV baseline Cr appears to be 2.5-2.8 - No ACEI - Nephrology signed off.    Anemia of chronic disease in setting of CKD Hb 8.8 on admission - holding relatively steady at this time - no evidence of acute blood  loss  DM Reasonably well controlled at present - no change in tx plan today   GERD  Gout Well compensated at this time   HTN Well controlled at present. On clonidine, amlodipine, carvedilol & hydralazine.   Code Status: FULL Family Communication:  Left message for daughter to call back on 8/23- plan to discuss palliative consult for GOC. Disposition Plan: PT recommends SNF but pt and daugther refusing- prefer to go home- daughter not at home during the day- have told pt that she will need to be stable on her feet prior to going home. Per cardiology note, patient has had several admissions but no outpatient followup. She absolutely needs to have OP followup appointments with CHF clinic, nephrology and pulmonology before she leaves the hospital if she continues to decline SNF placement.  Consultants: Nephrology Cardiolgoy  Procedures:  Foley-discontinued  Antibiotics: none  DVT prophylaxis: SQ heparin  HPI/Subjective: Says SOB continues to improve.  Objective: Blood pressure 124/45, pulse 55, temperature 98.2 F (36.8 C), temperature source Oral, resp. rate 18, height 5\' 4"  (1.626 m), weight 70.8 kg (156 lb 1.4 oz), SpO2 99.00%.  Intake/Output Summary (Last 24 hours) at 10/15/12 1704 Last data filed at 10/15/12 1653  Gross per 24 hour  Intake    960 ml  Output   2550 ml  Net  -1590 ml   Exam: General: Moderately built and overweight female patient lying in bed comfortably.  Lungs:  clear to auscultation anteriorly. Reduced breath sounds in the bases but otherwise clear to auscultation. No increased work of breathing. Tracheostomy +  Cardiovascular: Regular rate and rhythm without murmur gallop or rub normal  S1 and S2. 1+ bilateral leg edema. Tele: SB 50's-SR in 60's. Abdomen: Nontender, nondistended, soft, bowel sounds positive, no rebound, no ascites, no appreciable mass Extremities: No significant cyanosis, clubbing, or edema bilateral lower extremities CNS: Alert  and oriented.  Data Reviewed: Basic Metabolic Panel:  Recent Labs Lab 10/09/12 0330 10/10/12 0600 10/11/12 0500 10/12/12 0630 10/13/12 0445 10/14/12 0445 10/15/12 0515  NA 138 137 138 137 136 134* 135  K 3.5 3.7 3.8 4.1 4.5 4.8 4.6  CL 99 98 99 99 98 97 98  CO2 30 30 28 29 26 26 28   GLUCOSE 97 108* 104* 103* 103* 96 96  BUN 43* 48* 50* 52* 54* 58* 62*  CREATININE 2.85* 3.06* 3.10* 3.04* 3.16* 2.92* 3.04*  CALCIUM 9.2 9.1 9.1 9.1 9.4 9.2 8.9  MG 2.0 1.9 2.1  --  2.4  --   --   PHOS 3.8 3.8 4.2 4.3 4.3 4.0  --    Liver Function Tests:  Recent Labs Lab 10/10/12 0600 10/11/12 0500 10/12/12 0630 10/13/12 0445 10/14/12 0445  ALBUMIN 2.6* 2.6* 2.7* 2.8* 2.6*   CBC:  Recent Labs Lab 10/09/12 0330 10/10/12 0600 10/11/12 0500 10/14/12 0445  WBC 7.5 9.2 8.7 9.6  HGB 8.5* 8.6* 8.4* 8.8*  HCT 27.4* 27.4* 26.7* 27.6*  MCV 81.8 80.8 81.7 82.1  PLT 183 186 180 222   Cardiac Enzymes: No results found for this basename: CKTOTAL, CKMB, CKMBINDEX, TROPONINI,  in the last 168 hours BNP (last 3 results)  Recent Labs  10/09/12 1045 10/09/12 2302 10/11/12 0500  PROBNP 3863.0* 3450.0* 2714.0*   CBG:  Recent Labs Lab 10/14/12 1647 10/14/12 2126 10/15/12 0605 10/15/12 1100 10/15/12 1550  GLUCAP 92 93 89 95 121*    Recent Results (from the past 240 hour(s))  MRSA PCR SCREENING     Status: None   Collection Time    10/06/12  1:09 PM      Result Value Range Status   MRSA by PCR NEGATIVE  NEGATIVE Final   Comment:            The GeneXpert MRSA Assay (FDA     approved for NASAL specimens     only), is one component of a     comprehensive MRSA colonization     surveillance program. It is not     intended to diagnose MRSA     infection nor to guide or     monitor treatment for     MRSA infections.     Studies:  Recent x-ray studies have been reviewed in detail by the Attending Physician  Scheduled Meds:  Scheduled Meds: . albuterol  2.5 mg Nebulization  QID  . amLODipine  10 mg Oral Daily  . antiseptic oral rinse  15 mL Mouth Rinse Daily  . arformoterol  15 mcg Nebulization BID  . aspirin  81 mg Oral Daily  . atorvastatin  40 mg Oral q1800  . budesonide  0.25 mg Nebulization BID  . busPIRone  5 mg Oral BID  . carvedilol  12.5 mg Oral BID WC  . cloNIDine  0.1 mg Oral BID  . clopidogrel  75 mg Oral Q breakfast  . dextromethorphan-guaiFENesin  1 tablet Oral BID  . ferrous sulfate  325 mg Oral BID  . heparin  5,000 Units Subcutaneous Q8H  . hydrALAZINE  100 mg Oral TID  . insulin aspart  0-9 Units Subcutaneous TID WC  . ipratropium  0.5 mg Nebulization QID  . isosorbide  mononitrate  60 mg Oral Daily  . metoCLOPramide  5 mg Oral QID  . pantoprazole  40 mg Oral Daily  . sertraline  100 mg Oral QHS  . torsemide  60 mg Oral Daily  . vitamin C  500 mg Oral Daily    Time spent on care of this patient: 25 mins   [facility], MD  [facility] Office  [phone] Pager [phone]   If 7PM-7AM, please contact night-coverage [email or web address] Password [email or web address] 10/15/2012, 5:04 PM   LOS: 9 days

## 2012-10-15 NOTE — Progress Notes (Signed)
Respiratory exchange even and unlabored.  Able to verbalize needs without difficulty.

## 2012-10-16 ENCOUNTER — Telehealth: Payer: Self-pay

## 2012-10-16 LAB — GLUCOSE, CAPILLARY
Glucose-Capillary: 114 mg/dL — ABNORMAL HIGH (ref 70–99)
Glucose-Capillary: 104 mg/dL — ABNORMAL HIGH (ref 70–99)

## 2012-10-16 NOTE — Progress Notes (Signed)
Thank you for consulting the Palliative Medicine Team at [facility] to meet your patient's and family's needs.   The reason that you asked [place] to see your patient is for goals of care discussion  We have scheduled your patient for a meeting: awaiting call back from daughter Sunday Corn to schedule  The Surrogate decision maker is: patient stated her daughter, Sunday Corn ([phone]) helps with her decisions  Other family members that need to be present: per patient she would like daughter [name] to participate if possible  Your patient is able to participate: on this visit patient in recliner informed Clinical research associate that her daughter works during the day; agreeable to this Clinical research associate leaving a message for daughter and if daughter unable to participate patient willing to speak with PMT provider    Will await call back to try to schedule meeting -will f/u in am; of note patient is known to PMT from last hospitalization    Valente David, RN 10/16/2012, 5:29 PM Palliative Medicine Team RN Liaison [phone]

## 2012-10-16 NOTE — Progress Notes (Signed)
Patient ID: [name], female   DOB: 01-28-1941, 72 y.o.   MRN: [id number]   SUBJECTIVE: Alert this morning, looks pretty good overall.  Weight overall down and I/Os net negative on current diuretic.  Still wants to go home.   Marland Kitchen albuterol  2.5 mg Nebulization QID  . amLODipine  10 mg Oral Daily  . antiseptic oral rinse  15 mL Mouth Rinse Daily  . arformoterol  15 mcg Nebulization BID  . aspirin  81 mg Oral Daily  . atorvastatin  40 mg Oral q1800  . budesonide  0.25 mg Nebulization BID  . busPIRone  5 mg Oral BID  . carvedilol  12.5 mg Oral BID WC  . cloNIDine  0.1 mg Oral BID  . clopidogrel  75 mg Oral Q breakfast  . dextromethorphan-guaiFENesin  1 tablet Oral BID  . ferrous sulfate  325 mg Oral BID  . heparin  5,000 Units Subcutaneous Q8H  . hydrALAZINE  100 mg Oral TID  . insulin aspart  0-9 Units Subcutaneous TID WC  . ipratropium  0.5 mg Nebulization QID  . isosorbide mononitrate  60 mg Oral Daily  . metoCLOPramide  5 mg Oral QID  . pantoprazole  40 mg Oral Daily  . sertraline  100 mg Oral QHS  . torsemide  60 mg Oral Daily  . vitamin C  500 mg Oral Daily      Filed Vitals:   10/15/12 2300 10/16/12 0422 10/16/12 0455 10/16/12 0735  BP:   163/64   Pulse: 54 52 59   Temp:   99 F (37.2 C)   TempSrc:   Oral   Resp: 16 16 18    Height:      Weight:   73 kg (160 lb 15 oz)   SpO2: 99% 98% 96% 100%    Intake/Output Summary (Last 24 hours) at 10/16/12 0824 Last data filed at 10/16/12 0502  Gross per 24 hour  Intake   1040 ml  Output   2425 ml  Net  -1385 ml    LABS: Basic Metabolic Panel:  Recent Labs  40/98/11 0445 10/15/12 0515  NA 134* 135  K 4.8 4.6  CL 97 98  CO2 26 28  GLUCOSE 96 96  BUN 58* 62*  CREATININE 2.92* 3.04*  CALCIUM 9.2 8.9  PHOS 4.0  --    Liver Function Tests:  Recent Labs  10/14/12 0445  ALBUMIN 2.6*   No results found for this basename: LIPASE, AMYLASE,  in the last 72 hours CBC:  Recent Labs  10/14/12 0445    WBC 9.6  HGB 8.8*  HCT 27.6*  MCV 82.1  PLT 222   Cardiac Enzymes: No results found for this basename: CKTOTAL, CKMB, CKMBINDEX, TROPONINI,  in the last 72 hours BNP: No components found with this basename: POCBNP,  D-Dimer: No results found for this basename: DDIMER,  in the last 72 hours Hemoglobin A1C: No results found for this basename: HGBA1C,  in the last 72 hours Fasting Lipid Panel: No results found for this basename: CHOL, HDL, LDLCALC, TRIG, CHOLHDL, LDLDIRECT,  in the last 72 hours Thyroid Function Tests: No results found for this basename: TSH, T4TOTAL, FREET3, T3FREE, THYROIDAB,  in the last 72 hours Anemia Panel: No results found for this basename: VITAMINB12, FOLATE, FERRITIN, TIBC, IRON, RETICCTPCT,  in the last 72 hours  RADIOLOGY: Dg Chest 1 View  10/06/2012   *RADIOLOGY REPORT*  Clinical Data: Shortness of breath.  Fluid overload.  Current history  of chronic renal insufficiency, COPD, sleep apnea, asthma, hypertension, diabetes.  Prior history of CHF.  CHEST - 1 VIEW  Comparison: Portable chest x-ray earlier same date 0732 hours, 09/13/2012, 09/03/2012, 03/17/2012.  Findings: Lateral view of the chest read in conjunction with the AP portable chest x-ray earlier today demonstrates suboptimal inspiration with crowded bronchovascular markings in the lower lobes.  Cardiac silhouette moderately to markedly enlarged. Pulmonary venous hypertension without overt edema.  Very small bilateral pleural effusions.  Degenerative changes involving the thoracic spine.  IMPRESSION: Cardiomegaly and pulmonary venous hypertension without overt edema. Small bilateral pleural effusions.   Original Report Authenticated By: Hulan Saas, M.D.   Nm Pulmonary Perf And Vent  10/07/2012   CLINICAL DATA:  Dyspnea.  EXAM: NUCLEAR MEDICINE VENTILATION - PERFUSION LUNG SCAN  TECHNIQUE: Ventilation images were obtained in multiple projections using inhaled aerosol technetium 99 M DTPA. Perfusion  images were obtained in multiple projections after intravenous injection of Tc-74m MAA.  COMPARISON:  None.  RADIOPHARMACEUTICALS:  40 mCi Tc-53m DTPA aerosol and 6 mCi Tc-30m MAA  FINDINGS: Ventilation: None diagnostic DTPA ventilation images the due to tracheostomy.  Perfusion: No wedge shaped peripheral perfusion defects to suggest acute pulmonary embolism.  IMPRESSION: No pulmonary perfusion defects to suggest acute pulmonary embolism.   Electronically Signed   By: Myles Rosenthal   On: 10/07/2012 14:22   Dg Chest Port 1 View  10/09/2012   *RADIOLOGY REPORT*  Clinical Data: Congestive heart failure.  PORTABLE CHEST - 1 VIEW  Comparison: 10/07/2012  Findings: Tracheostomy tube is midline with tip above the carina. The heart size appears moderately enlarged.  There are low lung volumes.  Decreased aeration to the left lung base is again noted and appears unchanged from previous exam.  Right lung appears clear.  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  IMPRESSION:  1.  Persistent decreased aeration to the left base which may represent atelectasis and/or infiltrate.   Original Report Authenticated By: Signa Kell, M.D.   Dg Chest Port 1 View  10/07/2012   *RADIOLOGY REPORT*  Clinical Data: Dyspnea  PORTABLE CHEST - 1 VIEW  Comparison: 10/06/2012  Findings: Cardiac enlargement stable.  Tracheostomy tube unchanged. Right lung is clear.  Cannot evaluate the retrocardiac area well on single AP view due to superimposition of cardiac silhouette.  There appears to be discoid atelectasis in the lingula.  The diaphragm is visible.  IMPRESSION: Discoid atelectasis lingula.  Cannot exclude retrocardiac opacities.  Right lung is clear.   Original Report Authenticated By: Esperanza Heir, M.D.   Dg Chest Portable 1 View  10/06/2012   *RADIOLOGY REPORT*  Clinical Data: Shortness of breath.  PORTABLE CHEST - 1 VIEW  Comparison: 09/13/2012.  Findings: The tracheostomy tube is in good position.  Heart is  enlarged but stable.  Stable elevation of the left hemidiaphragm. Mild chronic vascular congestion without overt pulmonary edema or pleural effusion.  IMPRESSION:  1.  Stable tracheostomy tube. 2.  Stable cardiac enlargement and mild chronic vascular congestion. 3.  No overt pulmonary edema, focal infiltrates or effusions.   Original Report Authenticated By: Rudie Meyer, M.D.   Dg Chest Port 1 View  09/13/2012   *RADIOLOGY REPORT*  Clinical Data: Shortness of breath.  Chronic respiratory failure.  PORTABLE CHEST - 1 VIEW  Comparison: 09/08/2012  Findings: Tracheostomy tube is unchanged.  Cardiomegaly.  Left lower lobe atelectasis or infiltrate appears increased since prior study.  No confluent opacity on the right.  No right effusion. Cannot  exclude left effusion.  No acute bony abnormality.  IMPRESSION: Increasing left lower lobe atelectasis or infiltrate.  Cannot exclude a component of effusion.   Original Report Authenticated By: Charlett Nose, M.D.    PHYSICAL EXAM General: NAD, chronically ill, trach Neck: JVP not elevated, no thyromegaly or thyroid nodule.  Lungs: Decreased breath sounds at bases bilaterally CV: Nondisplaced PMI.  Heart regular S1/S2, no S3/S4, no murmur.  No peripheral edema.   Abdomen: Soft, nontender, no hepatosplenomegaly, no distention.  Neurologic: Alert and oriented x 3.  Psych: Normal affect. Extremities: No clubbing or cyanosis.   TELEMETRY: Reviewed telemetry pt in NSR  ASSESSMENT AND PLAN: 72 yo with chronic respiratory failure/trach in setting of COPD and OHS/OSA also with chronic LLL collapse, CKD IV, and CHF presented with increased dyspnea and was thought to be volume overloaded.  She has been admitted 10+ times in the last year.  She has never had outpatient cardiology followup.  1. CHF: Acute on chronic.  EF 45% with moderate pulmonary HTN and RV failure on last echo.  Volume status is difficult but seems improved now.  She is significantly negative by total  I/Os with weight down.   - Continue Coreg, hydral/Imdur.  No ACEI with significant CKD.  - She was on torsemide 20 mg daily at admission.  This is clearly an inadequate diuretic dose for her based on recurrent admissions with CHF exacerbations. She will need a higher diuretic dose as an outpatient.  Since she has been on torsemide already, would probably use torsemide 60 mg daily at discharge.  Renal function stable today, a bit up from baseline however.  2. Chronic respiratory failure: COPD, OHS/OSA, chronic LLL collapse.  Needs ongoing pulmonary followup.  3. CKD stage IV: Renal function has been relatively stable in the hospital, 3.04 creatinine today.  4. Patient needs better outpatient followup in terms of home health and MD appts (many, many admissions but no outpatient followup with cardiology or pulmonology ever!).  She needs to have scheduled appts with CHF clinic, nephrology, and pulmonology in place and discussed with whatever caregiver drives her (if she will not go to SNF) before she leaves the hospital.  She currently is not agreeable to SNF placement though I think this is her best option.  Agree with palliative care involvement, I think this is a good idea.     She is now on a stable po cardiac regimen, issue at this point is going to be what to do for discharge (refuses SNF, think palliative care would be good).  I have no changes to make at this point.  Please let me know if you need any more help with her, I will only follow her as needed now.  We will make sure she has appropriate followup in CHF clinic, please let us know when she is going to go home.  She already has appt for later this week but can push it back if needed.   [name] 10/16/2012 8:24 AM

## 2012-10-16 NOTE — Progress Notes (Signed)
Physical Therapy Treatment Patient Details Name: TINLEIGH WHITMIRE MRN: [id number] DOB: 09-13-1940 Today's Date: 10/16/2012 Time:  -     PT Assessment / Plan / Recommendation  History of Present Illness This is a 72 year old female, with history of Obesity, OSA, Asthma/COPD, chronic respiratory failure, s/p Trach, on home O2 at 4L/Min, Chronic Cor Pulmonale, HTN, s/p CVA, Cardiomyopathy, CHF, EF 45% per 2D Echocardiogram 06/2012, CKD-4, Gout, Dyslipidemia, chronic anemia/Thrombocytopenia, Diet-controlled DM-2, GERD, OA, presenting with progressive shortness of breath over 2-3 days, unassociated with chest pain, fever or chills. She has had a mild cough productive of whitish phlegm, as well as progressive bilateral LE swelling. Denies abdominal pain, vomiting or diarrhea. Daughter brought her to the ED today, because of worsening symptoms. In the ED, she was administered iv Lasix and NTG infusion, with amelioration of symptoms.    PT Comments   Pt making progress with mobility today.  Ambulated ~20' with RW.  She does become very SOB with minimal activity (ie: putting on socks) but 02 sats remained >90% 4L 02.  Pt states she normally uses manual w/c to get around house.  Pt refuses SNF however feel at this time that pt will be safe from mobility standpoint to d/c home with HHPT.     Follow Up Recommendations  Home health PT     Does the patient have the potential to tolerate intense rehabilitation     Barriers to Discharge        Equipment Recommendations  None recommended by PT    Recommendations for Other Services    Frequency Min 3X/week   Progress towards PT Goals Progress towards PT goals: Progressing toward goals  Plan Discharge plan needs to be updated    Precautions / Restrictions Precautions Precautions: Fall Precaution Comments: trach with passy muir valve and O2 Restrictions Weight Bearing Restrictions: No       Mobility  Bed Mobility Bed Mobility: Supine to  Sit;Sitting - Scoot to Edge of Bed Supine to Sit: 6: Modified independent (Device/Increase time);HOB elevated;With rails Sitting - Scoot to Edge of Bed: 6: Modified independent (Device/Increase time);With rail Transfers Transfers: Sit to Stand;Stand to Sit Sit to Stand: 5: Supervision;With upper extremity assist;From bed Stand to Sit: 5: Supervision;With upper extremity assist;With armrests;To chair/3-in-1 Ambulation/Gait Ambulation/Gait Assistance: 4: Min guard Ambulation Distance (Feet): 20 Feet Assistive device: Rolling walker Ambulation/Gait Assistance Details: guarding for safety.  Pt with labored breathing.   Gait Pattern: Decreased stride length;Shuffle;Trunk flexed Gait velocity: decreased General Gait Details: Pt steady but breathing becomes labored.  02 sats >90% 4L trach collar  Stairs: No Wheelchair Mobility Wheelchair Mobility: No      PT Goals (current goals can now be found in the care plan section) Acute Rehab PT Goals PT Goal Formulation: With patient Time For Goal Achievement: 10/19/12 Potential to Achieve Goals: Good  Visit Information  Last PT Received On: 10/16/12 Assistance Needed: +1 History of Present Illness: This is a 72 year old female, with history of Obesity, OSA, Asthma/COPD, chronic respiratory failure, s/p Trach, on home O2 at 4L/Min, Chronic Cor Pulmonale, HTN, s/p CVA, Cardiomyopathy, CHF, EF 45% per 2D Echocardiogram 06/2012, CKD-4, Gout, Dyslipidemia, chronic anemia/Thrombocytopenia, Diet-controlled DM-2, GERD, OA, presenting with progressive shortness of breath over 2-3 days, unassociated with chest pain, fever or chills. She has had a mild cough productive of whitish phlegm, as well as progressive bilateral LE swelling. Denies abdominal pain, vomiting or diarrhea. Daughter brought her to the ED today, because of worsening  symptoms. In the ED, she was administered iv Lasix and NTG infusion, with amelioration of symptoms.     Subjective Data       Cognition  Cognition Arousal/Alertness: Awake/alert Behavior During Therapy: WFL for tasks assessed/performed Overall Cognitive Status: Within Functional Limits for tasks assessed    Balance     End of Session PT - End of Session Equipment Utilized During Treatment: Gait belt;Oxygen Activity Tolerance: Patient tolerated treatment well;Patient limited by fatigue Patient left: in chair;with call bell/phone within reach Nurse Communication: Mobility status   GP     Lara Mulch 10/16/2012, 10:45 AM   Verdell Face, PTA [phone] 10/16/2012

## 2012-10-16 NOTE — Progress Notes (Signed)
Occupational Therapy Treatment Patient Details Name: APRIL COLTER MRN: [id number] DOB: 1940/07/10 Today's Date: 10/16/2012 Time: 8119-1478 OT Time Calculation (min): 23 min  OT Assessment / Plan / Recommendation  History of present illness This is a 72 year old female, with history of Obesity, OSA, Asthma/COPD, chronic respiratory failure, s/p Trach, on home O2 at 4L/Min, Chronic Cor Pulmonale, HTN, s/p CVA, Cardiomyopathy, CHF, EF 45% per 2D Echocardiogram 06/2012, CKD-4, Gout, Dyslipidemia, chronic anemia/Thrombocytopenia, Diet-controlled DM-2, GERD, OA, presenting with progressive shortness of breath over 2-3 days, unassociated with chest pain, fever or chills. She has had a mild cough productive of whitish phlegm, as well as progressive bilateral LE swelling. Denies abdominal pain, vomiting or diarrhea. Daughter brought her to the ED today, because of worsening symptoms. In the ED, she was administered iv Lasix and NTG infusion, with amelioration of symptoms.    OT comments  Pt making progress. D/C plan changed to HHOT. Pt reports today that she normally gets around in the house with her manual W/C when she is alone. She also reports that she is working on getting someone to help her every day v. every other day  Follow Up Recommendations  Home health OT       Equipment Recommendations  None recommended by OT       Frequency Min 2X/week   Progress towards OT Goals Progress towards OT goals: Progressing toward goals  Plan Discharge plan needs to be updated    Precautions / Restrictions Precautions Precautions: Fall Precaution Comments: trach with passy muir valve and O2 Restrictions Weight Bearing Restrictions: No   Pertinent Vitals/Pain O2 sats remained 94% or greater (beginning and end of session) on O2    ADL  Lower Body Dressing: Set up;Supervision/safety Where Assessed - Lower Body Dressing: Unsupported sitting Toilet Transfer: Supervision/safety Toilet Transfer  Method: Sit to Barista: Comfort height toilet Toileting - Clothing Manipulation and Hygiene: Supervision/safety;Set up Where Assessed - Toileting Clothing Manipulation and Hygiene: Sit to stand from 3-in-1 or toilet Transfers/Ambulation Related to ADLs: S for all with RW and O2 ADL Comments: Crosses legs to don socks     OT Goals(current goals can now be found in the care plan section)    Visit Information  Last OT Received On: 10/16/12 Assistance Needed: +1 PT/OT Co-Evaluation/Treatment: Yes (due to pt needs much encouargement to participate) History of Present Illness: This is a 72 year old female, with history of Obesity, OSA, Asthma/COPD, chronic respiratory failure, s/p Trach, on home O2 at 4L/Min, Chronic Cor Pulmonale, HTN, s/p CVA, Cardiomyopathy, CHF, EF 45% per 2D Echocardiogram 06/2012, CKD-4, Gout, Dyslipidemia, chronic anemia/Thrombocytopenia, Diet-controlled DM-2, GERD, OA, presenting with progressive shortness of breath over 2-3 days, unassociated with chest pain, fever or chills. She has had a mild cough productive of whitish phlegm, as well as progressive bilateral LE swelling. Denies abdominal pain, vomiting or diarrhea. Daughter brought her to the ED today, because of worsening symptoms. In the ED, she was administered iv Lasix and NTG infusion, with amelioration of symptoms.           Cognition  Cognition Arousal/Alertness: Awake/alert Behavior During Therapy: WFL for tasks assessed/performed Overall Cognitive Status: Within Functional Limits for tasks assessed    Mobility  Bed Mobility Bed Mobility: Supine to Sit;Sitting - Scoot to Edge of Bed Supine to Sit: 6: Modified independent (Device/Increase time);HOB elevated;With rails Sitting - Scoot to Edge of Bed: 6: Modified independent (Device/Increase time);With rail Transfers Transfers: Sit to Stand;Stand to Sit  Sit to Stand: 5: Supervision;With upper extremity assist;From bed Stand to Sit:  5: Supervision;With upper extremity assist;With armrests;To chair/3-in-1          End of Session OT - End of Session Equipment Utilized During Treatment: Gait belt;Rolling walker Activity Tolerance: Patient tolerated treatment well (reports her DOE is normal for her with activity) Patient left: in chair;with call bell/phone within reach Nurse Communication:  (OK'd for some apple juice)       [name] [phone] 10/16/2012, 9:38 AM

## 2012-10-16 NOTE — Progress Notes (Signed)
Trach care done. Pt tolerated  Procedure well.

## 2012-10-16 NOTE — Progress Notes (Signed)
Pt no complaints of SOB or pain during shift. Pt seen today by Pt/OT. Will continue to monitor Pt

## 2012-10-16 NOTE — Progress Notes (Signed)
[facility] Progress Note [place] TEAM 1 - Stepdown/ICU TEAM   NERI VIEYRA WUJ:[id number] DOB: 08/25/1940 DOA: 10/06/2012 PCP: Gwynneth Aliment, MD  Brief narrative: 72 year old female, with history of Obesity, OSA, Asthma/COPD, chronic respiratory failure s/p Trach, on home O2 at 4L/Min, Chronic Cor Pulmonale, HTN, s/p CVA, Cardiomyopathy, CHF (EF 45% per 2D Echocardiogram 06/2012), CKD-4, Gout, Dyslipidemia, chronic anemia/thrombocytopenia, diet-controlled DM-2, GERD, OA, who presented with progressive shortness of breath over 2-3 days, unassociated with chest pain, fever or chills. She had a mild cough productive of whitish phlegm, as well as progressive bilateral LE swelling. Denied abdominal pain, vomiting or diarrhea. Daughter brought her to the ED on 10/06/12, because of worsening symptoms. In the ED, she was administered iv Lasix and NTG infusion, with amelioration of symptoms.   Assessment/Plan:  Dyspnea/Respiratory failure, acute on chronic - Trach dependent with history of OSA/OHS, COPD/asthma, chronic LLL collapse, chronic  combined CHF - Multifactorial, with volume overload as primary contributor - see other issues detailed below - Says breathing is getting better. Looks comfortable. DOE - Discussed with Dr. Freida Busman, Cardiology: signed off since nothing more to add in patient. Agreed with  Palliative consult for GOC.  Acute on Chronic CHF - systolic + R heart failure / mod pulm HTN - EF 45% with hypokinesis and grade 2 diastolic dysfunction per echo 10/5619 - 15 L net negative since admit - diuretic dosing per Cardiology/CHF Team  -   Have started her on torsemide 60 mg daily. Continue Coreg, hydralazine & Imdur. No ACE inhibitor with  significant CKD.  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     Anemia of chronic disease in setting of CKD - Hb 8.8 on admission - holding relatively steady at this time - no evidence of acute blood loss  DM - Reasonably well controlled at present - no change in tx plan today   GERD  Gout - Well compensated at this time   HTN - Well controlled at present. On clonidine, amlodipine, carvedilol & hydralazine.    Code Status: FULL Family Communication:  Left message for daughter to call back on 8/23 & 8/25- No response. Charge nurse on floor also trying to reach daughter. Plan to discuss palliative consult for GOC. Disposition Plan: PT recommends SNF but pt and daugther refusing- prefer to go home- daughter not at home during the day- have told pt that she will need to be stable on her feet prior to going home. Per cardiology note, patient has had several admissions but no outpatient followup. She absolutely needs to have OP followup appointments with CHF clinic, nephrology and pulmonology before she leaves the hospital if she continues to decline SNF placement. PT & OT now state that she would be safe with HH PT & OT.  Consultants: Nephrology Cardiolgoy Palliative Care Medicine: will possibly see 8/26.  Procedures:  Foley-discontinued  Antibiotics: none  DVT prophylaxis: SQ heparin  HPI/Subjective: Says SOB continues to improve. Denies complaints.  Objective: Blood pressure 157/61, pulse 57, temperature 99 F (37.2 C), temperature source Oral, resp. rate 18, height 5\' 4"  (1.626 m), weight 73 kg (160 lb 15 oz), SpO2 98.00%.  Intake/Output Summary (Last 24 hours) at 10/16/12 1407 Last data filed at 10/16/12 1200  Gross per 24 hour  Intake   1300 ml  Output   2825 ml  Net  -1525 ml   Exam: General: Moderately built and overweight female patient lying in bed comfortably.  Lungs:  clear to auscultation. No increased work of breathing. Tracheostomy +  Cardiovascular: Regular rate and rhythm without murmur gallop or rub normal S1 and  S2. 1+ bilateral leg edema. Tele: SB 50's-SR in 60's. Abdomen: Nontender, nondistended, soft, bowel sounds positive, no rebound, no ascites, no appreciable mass Extremities: No significant cyanosis, clubbing, or edema bilateral lower extremities CNS: Alert and oriented. Hard of hearing- says her hearing aide battery has died.  Data Reviewed: Basic Metabolic Panel:  Recent Labs Lab 10/10/12 0600 10/11/12 0500 10/12/12 0630 10/13/12 0445 10/14/12 0445 10/15/12 0515  NA 137 138 137 136 134* 135  K 3.7 3.8 4.1 4.5 4.8 4.6  CL 98 99 99 98 97 98  CO2 30 28 29 26 26 28   GLUCOSE 108* 104* 103* 103* 96 96  BUN 48* 50* 52* 54* 58* 62*  CREATININE 3.06* 3.10* 3.04* 3.16* 2.92* 3.04*  CALCIUM 9.1 9.1 9.1 9.4 9.2 8.9  MG 1.9 2.1  --  2.4  --   --   PHOS 3.8 4.2 4.3 4.3 4.0  --    Liver Function Tests:  Recent Labs Lab 10/10/12 0600 10/11/12 0500 10/12/12 0630 10/13/12 0445 10/14/12 0445  ALBUMIN 2.6* 2.6* 2.7* 2.8* 2.6*   CBC:  Recent Labs Lab 10/10/12 0600 10/11/12 0500 10/14/12 0445  WBC 9.2 8.7 9.6  HGB 8.6* 8.4* 8.8*  HCT 27.4* 26.7* 27.6*  MCV 80.8 81.7 82.1  PLT 186 180 222   Cardiac Enzymes: No results found for this basename: CKTOTAL, CKMB, CKMBINDEX, TROPONINI,  in the last 168 hours BNP (last 3 results)  Recent Labs  10/09/12 1045 10/09/12 2302 10/11/12 0500  PROBNP 3863.0* 3450.0* 2714.0*   CBG:  Recent Labs Lab 10/15/12 1100 10/15/12 1550 10/15/12 2116 10/16/12 0602 10/16/12 1133  GLUCAP 95 121* 98 114* 115*    No results found for this or any previous visit (from the past 240 hour(s)).   Studies:  Recent x-ray studies have been reviewed in detail by the Attending Physician  Scheduled Meds:  Scheduled Meds: . albuterol  2.5 mg Nebulization QID  . amLODipine  10 mg Oral Daily  . antiseptic oral rinse  15 mL Mouth Rinse Daily  . arformoterol  15 mcg Nebulization BID  . aspirin  81 mg Oral Daily  . atorvastatin  40 mg Oral q1800  .  budesonide  0.25 mg Nebulization BID  . busPIRone  5 mg Oral BID  . carvedilol  12.5 mg Oral BID WC  . cloNIDine  0.1 mg Oral BID  . clopidogrel  75 mg Oral Q breakfast  . dextromethorphan-guaiFENesin  1 tablet Oral BID  . ferrous sulfate  325 mg Oral BID  . heparin  5,000 Units Subcutaneous Q8H  . hydrALAZINE  100 mg Oral TID  . insulin aspart  0-9 Units Subcutaneous TID WC  . ipratropium  0.5 mg Nebulization QID  . isosorbide mononitrate  60 mg Oral Daily  . metoCLOPramide  5 mg Oral QID  . pantoprazole  40 mg Oral Daily  . sertraline  100 mg Oral QHS  . torsemide  60 mg Oral Daily  . vitamin C  500 mg Oral Daily    Time spent on care of this patient: 20 mins   [facility], MD  [facility] Office  [phone] Pager -  [phone]   If 7PM-7AM, please contact night-coverage [email or web address] Password [email or web address] 10/16/2012, 2:07 PM   LOS: 10 days

## 2012-10-16 NOTE — Progress Notes (Signed)
Physical Therapy Treatment Patient Details Name: LEZLI DANEK MRN: [id number] DOB: Apr 05, 1940 Today's Date: 10/16/2012 Time:  -     PT Assessment / Plan / Recommendation  History of Present Illness This is a 72 year old female, with history of Obesity, OSA, Asthma/COPD, chronic respiratory failure, s/p Trach, on home O2 at 4L/Min, Chronic Cor Pulmonale, HTN, s/p CVA, Cardiomyopathy, CHF, EF 45% per 2D Echocardiogram 06/2012, CKD-4, Gout, Dyslipidemia, chronic anemia/Thrombocytopenia, Diet-controlled DM-2, GERD, OA, presenting with progressive shortness of breath over 2-3 days, unassociated with chest pain, fever or chills. She has had a mild cough productive of whitish phlegm, as well as progressive bilateral LE swelling. Denies abdominal pain, vomiting or diarrhea. Daughter brought her to the ED today, because of worsening symptoms. In the ED, she was administered iv Lasix and NTG infusion, with amelioration of symptoms.    PT Comments   Pt making progress with mobility today.  Ambulated ~20' with RW.  She does become very SOB with minimal activity (ie: putting on socks) but 02 sats remained >90% 4L 02.  Pt states she normally uses manual w/c to get around house.  Pt refuses SNF however feel at this time that pt will be safe from mobility standpoint to d/c home with HHPT.     Follow Up Recommendations  Home health PT     Does the patient have the potential to tolerate intense rehabilitation     Barriers to Discharge        Equipment Recommendations  None recommended by PT    Recommendations for Other Services    Frequency Min 3X/week   Progress towards PT Goals Progress towards PT goals: Progressing toward goals  Plan Discharge plan needs to be updated    Precautions / Restrictions Precautions Precautions: Fall Precaution Comments: trach with passy muir valve and O2 Restrictions Weight Bearing Restrictions: No       Mobility  Bed Mobility Bed Mobility: Supine to  Sit;Sitting - Scoot to Edge of Bed Supine to Sit: 6: Modified independent (Device/Increase time);HOB elevated;With rails Sitting - Scoot to Edge of Bed: 6: Modified independent (Device/Increase time);With rail Transfers Transfers: Sit to Stand;Stand to Sit Sit to Stand: 5: Supervision;With upper extremity assist;From bed Stand to Sit: 5: Supervision;With upper extremity assist;With armrests;To chair/3-in-1 Ambulation/Gait Ambulation/Gait Assistance: 4: Min guard Ambulation Distance (Feet): 20 Feet Assistive device: Rolling walker Ambulation/Gait Assistance Details: guarding for safety.  Pt with labored breathing.   Gait Pattern: Decreased stride length;Shuffle;Trunk flexed Gait velocity: decreased General Gait Details: Pt steady but breathing becomes labored.  02 sats >90% 4L trach collar  Stairs: No Wheelchair Mobility Wheelchair Mobility: No      PT Goals (current goals can now be found in the care plan section) Acute Rehab PT Goals PT Goal Formulation: With patient Time For Goal Achievement: 10/19/12 Potential to Achieve Goals: Good  Visit Information  Last PT Received On: 10/16/12 Assistance Needed: +1 History of Present Illness: This is a 72 year old female, with history of Obesity, OSA, Asthma/COPD, chronic respiratory failure, s/p Trach, on home O2 at 4L/Min, Chronic Cor Pulmonale, HTN, s/p CVA, Cardiomyopathy, CHF, EF 45% per 2D Echocardiogram 06/2012, CKD-4, Gout, Dyslipidemia, chronic anemia/Thrombocytopenia, Diet-controlled DM-2, GERD, OA, presenting with progressive shortness of breath over 2-3 days, unassociated with chest pain, fever or chills. She has had a mild cough productive of whitish phlegm, as well as progressive bilateral LE swelling. Denies abdominal pain, vomiting or diarrhea. Daughter brought her to the ED today, because of worsening  symptoms. In the ED, she was administered iv Lasix and NTG infusion, with amelioration of symptoms.     Subjective Data       Cognition  Cognition Arousal/Alertness: Awake/alert Behavior During Therapy: WFL for tasks assessed/performed Overall Cognitive Status: Within Functional Limits for tasks assessed    Balance     End of Session PT - End of Session Equipment Utilized During Treatment: Gait belt;Oxygen Activity Tolerance: Patient tolerated treatment well;Patient limited by fatigue Patient left: in chair;with call bell/phone within reach Nurse Communication: Mobility status   GP     Lara Mulch 10/16/2012, 10:45 AM   Verdell Face, PTA [phone] 10/16/2012  Agree with above assessment.  Lewis Shock, PT, DPT Pager #: [phone] Office #: [phone]

## 2012-10-16 NOTE — Telephone Encounter (Signed)
HFU is for Sob and COPD, pt does have a trach in place. HFU set with MW on 10-25-12. Carron Curie, CMA

## 2012-10-17 ENCOUNTER — Ambulatory Visit (HOSPITAL_COMMUNITY): Payer: Medicare Other

## 2012-10-17 LAB — GLUCOSE, CAPILLARY
Glucose-Capillary: 88 mg/dL (ref 70–99)
Glucose-Capillary: 86 mg/dL (ref 70–99)
Glucose-Capillary: 96 mg/dL (ref 70–99)

## 2012-10-17 LAB — BASIC METABOLIC PANEL
CO2: 24 mEq/L (ref 19–32)
Chloride: 100 mEq/L (ref 96–112)
Creatinine, Ser: 3.26 mg/dL — ABNORMAL HIGH (ref 0.50–1.10)
GFR calc Af Amer: 15 mL/min — ABNORMAL LOW (ref >90)
Potassium: 5.2 mEq/L — ABNORMAL HIGH (ref 3.5–5.1)
Sodium: 135 mEq/L (ref 135–145)

## 2012-10-17 MED ORDER — TORSEMIDE 20 MG PO TABS: 60.0000 mg | ORAL_TABLET | Freq: Every day | ORAL | Status: DC

## 2012-10-17 MED ORDER — TORSEMIDE 20 MG PO TABS
40.0000 mg | ORAL_TABLET | Freq: Every day | ORAL | Status: DC
  Administered 2012-10-18 – 2012-10-20 (×3): 40 mg via ORAL
  Filled 2012-10-17 (×5): qty 2

## 2012-10-17 NOTE — Progress Notes (Signed)
Patient ID: [name], female   DOB: 07-30-1940, 72 y.o.   MRN: [id number]    SUBJECTIVE: Alert this morning, looks pretty good overall.  Weight overall down and I/Os net negative on current diuretic.  Creatinine higher this morning.    Marland Kitchen albuterol  2.5 mg Nebulization QID  . amLODipine  10 mg Oral Daily  . antiseptic oral rinse  15 mL Mouth Rinse Daily  . arformoterol  15 mcg Nebulization BID  . aspirin  81 mg Oral Daily  . atorvastatin  40 mg Oral q1800  . budesonide  0.25 mg Nebulization BID  . busPIRone  5 mg Oral BID  . carvedilol  12.5 mg Oral BID WC  . cloNIDine  0.1 mg Oral BID  . clopidogrel  75 mg Oral Q breakfast  . dextromethorphan-guaiFENesin  1 tablet Oral BID  . ferrous sulfate  325 mg Oral BID  . heparin  5,000 Units Subcutaneous Q8H  . hydrALAZINE  100 mg Oral TID  . insulin aspart  0-9 Units Subcutaneous TID WC  . ipratropium  0.5 mg Nebulization QID  . isosorbide mononitrate  60 mg Oral Daily  . metoCLOPramide  5 mg Oral QID  . pantoprazole  40 mg Oral Daily  . sertraline  100 mg Oral QHS  . [START ON 10/18/2012] torsemide  40 mg Oral Daily  . vitamin C  500 mg Oral Daily      Filed Vitals:   10/17/12 0347 10/17/12 0620 10/17/12 0908 10/17/12 0945  BP:  171/81  142/51  Pulse: 51 58 56 56  Temp:  98 F (36.7 C)    TempSrc:  Oral    Resp: 16 19 16 17   Height:      Weight:  77.565 kg (171 lb)    SpO2: 97% 98% 98% 97%    Intake/Output Summary (Last 24 hours) at 10/17/12 0954 Last data filed at 10/17/12 0900  Gross per 24 hour  Intake   1920 ml  Output   2401 ml  Net   -481 ml    LABS: Basic Metabolic Panel:  Recent Labs  14/78/29 0515 10/17/12 0515  NA 135 135  K 4.6 5.2*  CL 98 100  CO2 28 24  GLUCOSE 96 95  BUN 62* 69*  CREATININE 3.04* 3.26*  CALCIUM 8.9 9.0   Liver Function Tests: No results found for this basename: AST, ALT, ALKPHOS, BILITOT, PROT, ALBUMIN,  in the last 72 hours No results found for this basename:  LIPASE, AMYLASE,  in the last 72 hours CBC: No results found for this basename: WBC, NEUTROABS, HGB, HCT, MCV, PLT,  in the last 72 hours Cardiac Enzymes: No results found for this basename: CKTOTAL, CKMB, CKMBINDEX, TROPONINI,  in the last 72 hours BNP: No components found with this basename: POCBNP,  D-Dimer: No results found for this basename: DDIMER,  in the last 72 hours Hemoglobin A1C: No results found for this basename: HGBA1C,  in the last 72 hours Fasting Lipid Panel: No results found for this basename: CHOL, HDL, LDLCALC, TRIG, CHOLHDL, LDLDIRECT,  in the last 72 hours Thyroid Function Tests: No results found for this basename: TSH, T4TOTAL, FREET3, T3FREE, THYROIDAB,  in the last 72 hours Anemia Panel: No results found for this basename: VITAMINB12, FOLATE, FERRITIN, TIBC, IRON, RETICCTPCT,  in the last 72 hours  RADIOLOGY: Dg Chest 1 View  10/06/2012   *RADIOLOGY REPORT*  Clinical Data: Shortness of breath.  Fluid overload.  Current history of chronic  renal insufficiency, COPD, sleep apnea, asthma, hypertension, diabetes.  Prior history of CHF.  CHEST - 1 VIEW  Comparison: Portable chest x-ray earlier same date 0732 hours, 09/13/2012, 09/03/2012, 03/17/2012.  Findings: Lateral view of the chest read in conjunction with the AP portable chest x-ray earlier today demonstrates suboptimal inspiration with crowded bronchovascular markings in the lower lobes.  Cardiac silhouette moderately to markedly enlarged. Pulmonary venous hypertension without overt edema.  Very small bilateral pleural effusions.  Degenerative changes involving the thoracic spine.  IMPRESSION: Cardiomegaly and pulmonary venous hypertension without overt edema. Small bilateral pleural effusions.   Original Report Authenticated By: Hulan Saas, M.D.   Nm Pulmonary Perf And Vent  10/07/2012   CLINICAL DATA:  Dyspnea.  EXAM: NUCLEAR MEDICINE VENTILATION - PERFUSION LUNG SCAN  TECHNIQUE: Ventilation images were  obtained in multiple projections using inhaled aerosol technetium 99 M DTPA. Perfusion images were obtained in multiple projections after intravenous injection of Tc-67m MAA.  COMPARISON:  None.  RADIOPHARMACEUTICALS:  40 mCi Tc-73m DTPA aerosol and 6 mCi Tc-7m MAA  FINDINGS: Ventilation: None diagnostic DTPA ventilation images the due to tracheostomy.  Perfusion: No wedge shaped peripheral perfusion defects to suggest acute pulmonary embolism.  IMPRESSION: No pulmonary perfusion defects to suggest acute pulmonary embolism.   Electronically Signed   By: Myles Rosenthal   On: 10/07/2012 14:22   Dg Chest Port 1 View  10/09/2012   *RADIOLOGY REPORT*  Clinical Data: Congestive heart failure.  PORTABLE CHEST - 1 VIEW  Comparison: 10/07/2012  Findings: Tracheostomy tube is midline with tip above the carina. The heart size appears moderately enlarged.  There are low lung volumes.  Decreased aeration to the left lung base is again noted and appears unchanged from previous exam.  Right lung appears clear.  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  IMPRESSION:  1.  Persistent decreased aeration to the left base which may represent atelectasis and/or infiltrate.   Original Report Authenticated By: Signa Kell, M.D.   Dg Chest Port 1 View  10/07/2012   *RADIOLOGY REPORT*  Clinical Data: Dyspnea  PORTABLE CHEST - 1 VIEW  Comparison: 10/06/2012  Findings: Cardiac enlargement stable.  Tracheostomy tube unchanged. Right lung is clear.  Cannot evaluate the retrocardiac area well on single AP view due to superimposition of cardiac silhouette.  There appears to be discoid atelectasis in the lingula.  The diaphragm is visible.  IMPRESSION: Discoid atelectasis lingula.  Cannot exclude retrocardiac opacities.  Right lung is clear.   Original Report Authenticated By: Esperanza Heir, M.D.   Dg Chest Portable 1 View  10/06/2012   *RADIOLOGY REPORT*  Clinical Data: Shortness of breath.  PORTABLE CHEST - 1 VIEW   Comparison: 09/13/2012.  Findings: The tracheostomy tube is in good position.  Heart is enlarged but stable.  Stable elevation of the left hemidiaphragm. Mild chronic vascular congestion without overt pulmonary edema or pleural effusion.  IMPRESSION:  1.  Stable tracheostomy tube. 2.  Stable cardiac enlargement and mild chronic vascular congestion. 3.  No overt pulmonary edema, focal infiltrates or effusions.   Original Report Authenticated By: Rudie Meyer, M.D.   Dg Chest Port 1 View  09/13/2012   *RADIOLOGY REPORT*  Clinical Data: Shortness of breath.  Chronic respiratory failure.  PORTABLE CHEST - 1 VIEW  Comparison: 09/08/2012  Findings: Tracheostomy tube is unchanged.  Cardiomegaly.  Left lower lobe atelectasis or infiltrate appears increased since prior study.  No confluent opacity on the right.  No right effusion. Cannot exclude left  effusion.  No acute bony abnormality.  IMPRESSION: Increasing left lower lobe atelectasis or infiltrate.  Cannot exclude a component of effusion.   Original Report Authenticated By: Charlett Nose, M.D.    PHYSICAL EXAM General: NAD, chronically ill, trach Neck: JVP not elevated, no thyromegaly or thyroid nodule.  Lungs: Decreased breath sounds at bases bilaterally CV: Nondisplaced PMI.  Heart regular S1/S2, no S3/S4, no murmur.  No peripheral edema.   Abdomen: Soft, nontender, no hepatosplenomegaly, no distention.  Neurologic: Alert and oriented x 3.  Psych: Normal affect. Extremities: No clubbing or cyanosis.   TELEMETRY: Reviewed telemetry pt in NSR  ASSESSMENT AND PLAN: 72 yo with chronic respiratory failure/trach in setting of COPD and OHS/OSA also with chronic LLL collapse, CKD IV, and CHF presented with increased dyspnea and was thought to be volume overloaded.  She has been admitted 10+ times in the last year.  She has never had outpatient cardiology followup.  1. CHF: Acute on chronic.  EF 45% with moderate pulmonary HTN and RV failure on last echo.   Volume status is difficult but seems improved now.  She is significantly negative by total I/Os with weight down.   - Continue Coreg, hydral/Imdur.  No ACEI with significant CKD.  - She was on torsemide 20 mg daily at admission.  This is an inadequate diuretic dose for her based on recurrent admissions with CHF exacerbations but creatinine rising on torsemide 60 mg daily.  Will hold torsemide today and restart tomorrow at 40 mg daily if creatinine is stable.  2. Chronic respiratory failure: COPD, OHS/OSA, chronic LLL collapse.  Needs ongoing pulmonary followup.  3. CKD stage IV: Rise in creatinine, as above will hold torsemide today and restart tomorrow at lower dose if renal function stabilizes.  4. Patient needs better outpatient followup in terms of home health and MD appts (many, many admissions but no outpatient followup with cardiology or pulmonology ever!).  She needs to have scheduled appts with CHF clinic, nephrology, and pulmonology in place and discussed with whatever caregiver drives her (if she will not go to SNF) before she leaves the hospital.  She currently is not agreeable to SNF placement though I think this is her best option.  Agree with palliative care involvement, I think this is a good idea.     [name] 10/17/2012 9:54 AM

## 2012-10-17 NOTE — Progress Notes (Signed)
Trach team visit, pt wearing pmsv and tolerating well.  Spoke to pt with her demonstrating clear voice.  No speech needs identified at this time.  Donavan Burnet, MS [facility] SLP [phone]

## 2012-10-17 NOTE — Progress Notes (Signed)
Follow-up: spoke with patient at bedside, no call back from patient's daughter, [name]- Ms Arca stated her daughter works late and indicated she/patient would be willing to speak with the PMT provider without her daughter if necessary - PMT provider will see patient later today for follow-up   Valente David, RN 10/17/2012, 11:05 AM Palliative Medicine Team RN Liaison [phone]

## 2012-10-17 NOTE — Progress Notes (Signed)
[facility] Progress Note [place] TEAM 1 - Stepdown/ICU TEAM   [name] WNU:[id number] DOB: Jul 18, 1940 DOA: 10/06/2012 PCP: Gwynneth Aliment, MD  Brief narrative: 72 year old female, with history of Obesity, OSA, Asthma/COPD, chronic respiratory failure s/p Trach, on home O2 at 4L/Min, Chronic Cor Pulmonale, HTN, s/p CVA, Cardiomyopathy, CHF (EF 45% per 2D Echocardiogram 06/2012), CKD-4 open baseline creatinine 2.76), Gout, Dyslipidemia, chronic anemia/thrombocytopenia, diet-controlled DM-2, GERD, OA, who presented with progressive shortness of breath over 2-3 days, unassociated with chest pain, fever or chills. She had a mild cough productive of whitish phlegm, as well as progressive bilateral LE swelling. Denied abdominal pain, vomiting or diarrhea. Daughter brought her to the ED on 10/06/12, because of worsening symptoms. In the ED, she was administered iv Lasix and NTG infusion, with amelioration of symptoms. Her acute respiratory failure was due to decompensated CHF complicating underlying chronic respiratory failure/tracheostomy, COPD, OHS/OSA and chronic LLL collapse. VQ scan negative. Cardiology, nephrology consulted. Patient was aggressively diuresed- approximately 15 L negative. Nephrology did not think that she was a candidate for RRT. Patient has clinically improved but is at high risk for recurrent decline and hospitalization. She declines SNF admission. Palliative care consultation for goals of care is pending.  Assessment/Plan:  Dyspnea/Respiratory failure, acute on chronic - Trach dependent with history of OSA/OHS, COPD/asthma, chronic LLL collapse, chronic combined CHF - Multifactorial, with volume overload as primary contributor - see other issues detailed below - Says breathing is getting better. Looks comfortable. DOE - Palliative consulted for GOC-pending.  Acute on Chronic CHF - systolic + R heart failure / mod pulm HTN - EF 45% with hypokinesis and grade 2  diastolic dysfunction per echo 0/3474 - 15 L net negative since admit - diuretic dosing per Cardiology/CHF Team  -   Have started her on torsemide 60 mg daily. Continue Coreg, hydralazine & Imdur. No ACE inhibitor with  significant CKD. - Creatinine has increased today and mild hyperkalemia. Hold torsemide today and if creatinine better, resume in the a.m. at 40  mg daily.  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    Anemia of chronic disease in setting of CKD - Hb 8.8 on admission - holding relatively steady at this time - no evidence of acute blood loss  DM - Reasonably well controlled at present - no change in tx plan today   GERD  Gout - Well compensated at this time   HTN - Well controlled at present. On clonidine, amlodipine, carvedilol & hydralazine.    Code Status: FULL Family Communication:  Unable to reach patient's daughter despite multiple attempts by M.D., charge nurse on the floor, palliative care team. Disposition Plan: Patient has had several hospitalizations and no outpatient followup. PT and OT had initially recommended SNF which patient and daughter declined. Subsequently, PT and OT recommend home with home health therapies. Given her multiple comorbidities and potential for decline again, palliative care has been consulted for goals of care. Disposition pending GOC meeting.  Consultants: Nephrology Cardiolgoy Palliative Care Medicine: will see 8/26.  Procedures:  Foley-discontinued  Antibiotics: none  DVT prophylaxis: SQ heparin  HPI/Subjective: Denies complaints.  Objective: Blood pressure 133/55, pulse 59, temperature 98 F (36.7 C), temperature source Oral, resp. rate 18, height 5\' 4"  (1.626 m), weight  77.565 kg (171 lb), SpO2 99.00%.  Intake/Output Summary (Last 24  hours) at 10/17/12 1615 Last data filed at 10/17/12 1500  Gross per 24 hour  Intake   1320 ml  Output   2301 ml  Net   -981 ml   Exam: General: Moderately built and overweight female patient lying in bed comfortably.  Lungs:  clear to auscultation. No increased work of breathing. Tracheostomy +  Cardiovascular: Regular rate and rhythm without murmur gallop or rub normal S1 and S2. 1+ bilateral leg edema. Tele: SB 50's-SR in 60's. Abdomen: Nontender, nondistended, soft, bowel sounds positive, no rebound, no ascites, no appreciable mass Extremities: No significant cyanosis, clubbing, or edema bilateral lower extremities CNS: Alert and oriented. Hard of hearing- says her hearing aide battery has died.  Data Reviewed: Basic Metabolic Panel:  Recent Labs Lab 10/11/12 0500 10/12/12 0630 10/13/12 0445 10/14/12 0445 10/15/12 0515 10/17/12 0515  NA 138 137 136 134* 135 135  K 3.8 4.1 4.5 4.8 4.6 5.2*  CL 99 99 98 97 98 100  CO2 28 29 26 26 28 24   GLUCOSE 104* 103* 103* 96 96 95  BUN 50* 52* 54* 58* 62* 69*  CREATININE 3.10* 3.04* 3.16* 2.92* 3.04* 3.26*  CALCIUM 9.1 9.1 9.4 9.2 8.9 9.0  MG 2.1  --  2.4  --   --   --   PHOS 4.2 4.3 4.3 4.0  --   --    Liver Function Tests:  Recent Labs Lab 10/11/12 0500 10/12/12 0630 10/13/12 0445 10/14/12 0445  ALBUMIN 2.6* 2.7* 2.8* 2.6*   CBC:  Recent Labs Lab 10/11/12 0500 10/14/12 0445  WBC 8.7 9.6  HGB 8.4* 8.8*  HCT 26.7* 27.6*  MCV 81.7 82.1  PLT 180 222   Cardiac Enzymes: No results found for this basename: CKTOTAL, CKMB, CKMBINDEX, TROPONINI,  in the last 168 hours BNP (last 3 results)  Recent Labs  10/09/12 1045 10/09/12 2302 10/11/12 0500  PROBNP 3863.0* 3450.0* 2714.0*   CBG:  Recent Labs Lab 10/16/12 1612 10/16/12 2122 10/17/12 0619 10/17/12 1107 10/17/12 1549  GLUCAP 88 104* 86 96 128*    No results found for this or any previous visit (from the past 240 hour(s)).   Studies:  Recent x-ray  studies have been reviewed in detail by the Attending Physician  Scheduled Meds:  Scheduled Meds: . albuterol  2.5 mg Nebulization QID  . amLODipine  10 mg Oral Daily  . antiseptic oral rinse  15 mL Mouth Rinse Daily  . arformoterol  15 mcg Nebulization BID  . aspirin  81 mg Oral Daily  . atorvastatin  40 mg Oral q1800  . budesonide  0.25 mg Nebulization BID  . busPIRone  5 mg Oral BID  . carvedilol  12.5 mg Oral BID WC  . cloNIDine  0.1 mg Oral BID  . clopidogrel  75 mg Oral Q breakfast  . dextromethorphan-guaiFENesin  1 tablet Oral BID  . ferrous sulfate  325 mg Oral BID  . heparin  5,000 Units Subcutaneous Q8H  . hydrALAZINE  100 mg Oral TID  . insulin aspart  0-9 Units Subcutaneous TID WC  . ipratropium  0.5 mg Nebulization QID  . isosorbide mononitrate  60 mg Oral Daily  . metoCLOPramide  5 mg Oral QID  . pantoprazole  40 mg Oral Daily  . sertraline  100 mg Oral QHS  . [START ON 10/18/2012] torsemide  40 mg Oral Daily  . vitamin C  500 mg Oral  Daily    Time spent on care of this patient: 20 mins   [facility], MD  [facility] Office  [phone] Pager [phone]   If 7PM-7AM, please contact night-coverage [email or web address] Password [email or web address] 10/17/2012, 4:14 PM   LOS: 11 days

## 2012-10-17 NOTE — Progress Notes (Signed)
Physical Therapy Treatment Patient Details Name: TIANNA BAUS MRN: [id number] DOB: Oct 30, 1940 Today's Date: 10/17/2012 Time: 1112-1130 PT Time Calculation (min): 18 min  PT Assessment / Plan / Recommendation  History of Present Illness This is a 72 year old female, with history of Obesity, OSA, Asthma/COPD, chronic respiratory failure, s/p Trach, on home O2 at 4L/Min, Chronic Cor Pulmonale, HTN, s/p CVA, Cardiomyopathy, CHF, EF 45% per 2D Echocardiogram 06/2012, CKD-4, Gout, Dyslipidemia, chronic anemia/Thrombocytopenia, Diet-controlled DM-2, GERD, OA, presenting with progressive shortness of breath over 2-3 days, unassociated with chest pain, fever or chills. She has had a mild cough productive of whitish phlegm, as well as progressive bilateral LE swelling. Denies abdominal pain, vomiting or diarrhea. Daughter brought her to the ED today, because of worsening symptoms. In the ED, she was administered iv Lasix and NTG infusion, with amelioration of symptoms.    PT Comments   Pt agreeable to participate in therapy but only agreeable to ambulate from bed/recliner<>doorway again today as she did in previous PT session.  Pt cont's to become very SOB with activity but 02 sats >90% 4L.    Follow Up Recommendations  Home health PT     Does the patient have the potential to tolerate intense rehabilitation     Barriers to Discharge        Equipment Recommendations  None recommended by PT    Recommendations for Other Services    Frequency Min 3X/week   Progress towards PT Goals Progress towards PT goals: Progressing toward goals  Plan Current plan remains appropriate    Precautions / Restrictions Precautions Precautions: Fall Precaution Comments: trach with passy muir valve and O2 Restrictions Weight Bearing Restrictions: No       Mobility  Bed Mobility Bed Mobility: Supine to Sit;Sitting - Scoot to Edge of Bed Supine to Sit: 6: Modified independent (Device/Increase time);HOB  elevated Sitting - Scoot to Edge of Bed: 6: Modified independent (Device/Increase time) Transfers Transfers: Sit to Stand;Stand to Sit Sit to Stand: 5: Supervision;With upper extremity assist;From bed Stand to Sit: 5: Supervision;With upper extremity assist;With armrests;To chair/3-in-1 Details for Transfer Assistance: cues to reinforce safest hand placement due to pt attempting to pull to standing with hands on RW Ambulation/Gait Ambulation/Gait Assistance: 4: Min guard Ambulation Distance (Feet): 20 Feet Assistive device: Rolling walker Ambulation/Gait Assistance Details: Pt with 3/4 dyspnea.  Guarding for safety due to pt fatigues quickly Gait Pattern: Decreased stride length;Shuffle Gait velocity: decreased General Gait Details: Pt steady but breathing becomes labored.  02 sats >90% 4L trach collar  Stairs: No Wheelchair Mobility Wheelchair Mobility: No      PT Goals (current goals can now be found in the care plan section) Acute Rehab PT Goals PT Goal Formulation: With patient Time For Goal Achievement: 10/19/12 Potential to Achieve Goals: Good  Visit Information  Last PT Received On: 10/17/12 Assistance Needed: +1 History of Present Illness: This is a 72 year old female, with history of Obesity, OSA, Asthma/COPD, chronic respiratory failure, s/p Trach, on home O2 at 4L/Min, Chronic Cor Pulmonale, HTN, s/p CVA, Cardiomyopathy, CHF, EF 45% per 2D Echocardiogram 06/2012, CKD-4, Gout, Dyslipidemia, chronic anemia/Thrombocytopenia, Diet-controlled DM-2, GERD, OA, presenting with progressive shortness of breath over 2-3 days, unassociated with chest pain, fever or chills. She has had a mild cough productive of whitish phlegm, as well as progressive bilateral LE swelling. Denies abdominal pain, vomiting or diarrhea. Daughter brought her to the ED today, because of worsening symptoms. In the ED, she was administered iv  Lasix and NTG infusion, with amelioration of symptoms.     Subjective  Data      Cognition  Cognition Arousal/Alertness: Awake/alert Behavior During Therapy: WFL for tasks assessed/performed Overall Cognitive Status: Within Functional Limits for tasks assessed    Balance     End of Session PT - End of Session Equipment Utilized During Treatment: Gait belt;Oxygen (trach 4L ) Activity Tolerance: Patient tolerated treatment well;Patient limited by fatigue Patient left: in chair;with call bell/phone within reach Nurse Communication: Mobility status   GP     Lara Mulch 10/17/2012, 12:52 PM  Verdell Face, PTA [phone] 10/17/2012

## 2012-10-17 NOTE — Progress Notes (Signed)
Patient ZO:[id number] [name]      DOB: 07/04/1940      WUJ:[id number]  Met with [name].  She was sitting comfortably in her chair.  Presently hears without deficit.  PMV in place able to communicate wishes and needs.  Appears comfortable.  Relates that I may sit and talk but is guarded.  Began to explain who I was and asked if her daughter could be involved.  She relates she works late but that she would want that. [name] was her for a few minutes earlier but left before anyone could be mobilized.  I have left a voice mail on her cell phone to call us to schedule a visit together.  It makes no sense to speak only to the patient as she is not the one who brings her back and forth to the hospital.  In order to have the best impact need to include her daughter. We have attempted to engage this family in the past.  Goals at that time were full treatment.  Will await return call from daughter.   Felicia Fabrizio L. Ladona Ridgel, MD MBA The Palliative Medicine Team at [facility] Phone: [phone] Pager: [phone]

## 2012-10-17 NOTE — Progress Notes (Signed)
Pt no complaints of SOB or CP during shift

## 2012-10-18 LAB — BASIC METABOLIC PANEL
BUN: 68 mg/dL — ABNORMAL HIGH (ref 6–23)
CO2: 24 mEq/L (ref 19–32)
Calcium: 9.1 mg/dL (ref 8.4–10.5)
Chloride: 100 mEq/L (ref 96–112)
Creatinine, Ser: 3.3 mg/dL — ABNORMAL HIGH (ref 0.50–1.10)
Glucose, Bld: 90 mg/dL (ref 70–99)

## 2012-10-18 LAB — GLUCOSE, CAPILLARY: Glucose-Capillary: 130 mg/dL — ABNORMAL HIGH (ref 70–99)

## 2012-10-18 MED ORDER — CARVEDILOL 6.25 MG PO TABS
18.7500 mg | ORAL_TABLET | Freq: Two times a day (BID) | ORAL | Status: DC
  Administered 2012-10-18 – 2012-10-20 (×6): 18.75 mg via ORAL
  Filled 2012-10-18 (×7): qty 1

## 2012-10-18 MED ORDER — GLUCERNA SHAKE PO LIQD
237.0000 mL | Freq: Two times a day (BID) | ORAL | Status: DC | PRN
  Administered 2012-10-18: 237 mL via ORAL

## 2012-10-18 MED ORDER — SODIUM CHLORIDE 0.9 % IJ SOLN
3.0000 mL | Freq: Two times a day (BID) | INTRAMUSCULAR | Status: DC
  Administered 2012-10-18 – 2012-10-20 (×5): 3 mL via INTRAVENOUS

## 2012-10-18 NOTE — Progress Notes (Signed)
Patient refused Lipitor due to an ad on tv that stated it's not good for patients with diabetes.

## 2012-10-18 NOTE — Progress Notes (Addendum)
[facility] Progress Note [place] TEAM 1 - Stepdown/ICU TEAM   [name] XBJ:[id number] DOB: 14-Dec-1940 DOA: 10/06/2012 PCP: Gwynneth Aliment, MD  Brief narrative: 72 year old female, with history of Obesity, OSA, Asthma/COPD, chronic respiratory failure s/p Trach, on home O2 at 4L/Min, Chronic Cor Pulmonale, HTN, s/p CVA, Cardiomyopathy, CHF (EF 45% per 2D Echocardiogram 06/2012), CKD-4 open baseline creatinine 2.76), Gout, Dyslipidemia, chronic anemia/thrombocytopenia, diet-controlled DM-2, GERD, OA, who presented with progressive shortness of breath over 2-3 days, unassociated with chest pain, fever or chills. She had a mild cough productive of whitish phlegm, as well as progressive bilateral LE swelling. Denied abdominal pain, vomiting or diarrhea. Daughter brought her to the ED on 10/06/12, because of worsening symptoms. In the ED, she was administered iv Lasix and NTG infusion, with amelioration of symptoms. Her acute respiratory failure was due to decompensated CHF complicating underlying chronic respiratory failure/tracheostomy, COPD, OHS/OSA and chronic LLL collapse. VQ scan negative. Cardiology, nephrology consulted. Patient was aggressively diuresed- approximately 15 L negative. Nephrology did not think that she was a candidate for RRT. Patient has clinically improved but is at high risk for recurrent decline and hospitalization. She declines SNF admission. Palliative care consultation for goals of care is being adressed. Likely home with HHC, f/u appointments if refusing snf  -tried to contact family again/today no answer; wioll c/s SW; possible d/c tomorrow   Assessment/Plan:  Dyspnea/Respiratory failure, acute on chronic - Trach dependent with history of OSA/OHS, COPD/asthma, chronic LLL collapse, chronic combined CHF - Multifactorial, with volume overload as primary contributor - see other issues detailed below - Says breathing is getting better. Looks comfortable.  DOE - Palliative consulted for GOC, appreciate input   Acute on Chronic CHF - systolic + R heart failure / mod pulm HTN - EF 45% with hypokinesis and grade 2 diastolic dysfunction per echo 04/863 - 15 L net negative since admit - diuretic dosing per Cardiology/CHF Team  -   on torsemide 60 mg daily, adjust based on renal function. Continue Coreg, hydralazine & Imdur. No ACE inhibitor with  significant CKD. - Creatinine has increased today and mild hyperkalemia. Hold torsemide today and if creatinine better, resume at 40  mg daily.  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    Anemia of chronic disease in setting of CKD - Hb 8.8 on admission - holding relatively steady at this time - no evidence of acute blood loss  DM - Reasonably well controlled at present - no change in tx plan today   GERD  Gout - Well compensated at this time   HTN - controlled at present. On clonidine, amlodipine, carvedilol & hydralazine.    Code Status: FULL Family Communication:  Previously Unable to reach patient's daughter despite multiple attempts by M.D., charge nurse on the floor, palliative care team. -will call her daughter today  Disposition Plan: Patient has had several hospitalizations and no outpatient followup. PT and OT had initially recommended SNF which patient and daughter declined. Subsequently, PT and OT recommend home with home health therapies. Given her multiple comorbidities and potential for decline again, palliative care has been consulted for goals of care. Disposition pending GOC meeting.  Consultants: Nephrology Cardiolgoy Palliative Care Medicine: will see 8/26.  Procedures:  Foley-discontinued  Antibiotics: none  DVT prophylaxis: SQ heparin  HPI/Subjective: Denies  complaints.  Objective: Blood pressure 165/64, pulse 57, temperature 98.6 F (37 C), temperature source Oral, resp. rate 17, height 5\' 4"  (1.626 m), weight 77.701 kg (171 lb 4.8 oz), SpO2 99.00%.  Intake/Output Summary (Last 24 hours) at 10/18/12 0951 Last data filed at 10/18/12 0900  Gross per 24 hour  Intake   1260 ml  Output   1550 ml  Net   -290 ml   Exam: General: Moderately built and overweight female patient lying in bed comfortably.  Lungs:  clear to auscultation. No increased work of breathing. Tracheostomy +  Cardiovascular: Regular rate and rhythm without murmur gallop or rub normal S1 and S2. 1+ bilateral leg edema. Tele: SB 50's-SR in 60's. Abdomen: Nontender, nondistended, soft, bowel sounds positive, no rebound, no ascites, no appreciable mass Extremities: No significant cyanosis, clubbing, or edema bilateral lower extremities CNS: Alert and oriented. Hard of hearing- says her hearing aide battery has died.  Data Reviewed: Basic Metabolic Panel:  Recent Labs Lab 10/12/12 0630 10/13/12 0445 10/14/12 0445 10/15/12 0515 10/17/12 0515 10/18/12 0645  NA 137 136 134* 135 135 134*  K 4.1 4.5 4.8 4.6 5.2* 5.1  CL 99 98 97 98 100 100  CO2 29 26 26 28 24 24   GLUCOSE 103* 103* 96 96 95 90  BUN 52* 54* 58* 62* 69* 68*  CREATININE 3.04* 3.16* 2.92* 3.04* 3.26* 3.30*  CALCIUM 9.1 9.4 9.2 8.9 9.0 9.1  MG  --  2.4  --   --   --   --   PHOS 4.3 4.3 4.0  --   --   --    Liver Function Tests:  Recent Labs Lab 10/12/12 0630 10/13/12 0445 10/14/12 0445  ALBUMIN 2.7* 2.8* 2.6*   CBC:  Recent Labs Lab 10/14/12 0445  WBC 9.6  HGB 8.8*  HCT 27.6*  MCV 82.1  PLT 222   Cardiac Enzymes: No results found for this basename: CKTOTAL, CKMB, CKMBINDEX, TROPONINI,  in the last 168 hours BNP (last 3 results)  Recent Labs  10/09/12 1045 10/09/12 2302 10/11/12 0500  PROBNP 3863.0* 3450.0* 2714.0*   CBG:  Recent Labs Lab 10/17/12 0619 10/17/12 1107  10/17/12 1549 10/17/12 2156 10/18/12 0601  GLUCAP 86 96 128* 106* 96    No results found for this or any previous visit (from the past 240 hour(s)).   Studies:  Recent x-ray studies have been reviewed in detail by the Attending Physician  Scheduled Meds:  Scheduled Meds: . albuterol  2.5 mg Nebulization QID  . amLODipine  10 mg Oral Daily  . antiseptic oral rinse  15 mL Mouth Rinse Daily  . arformoterol  15 mcg Nebulization BID  . aspirin  81 mg Oral Daily  . atorvastatin  40 mg Oral q1800  . budesonide  0.25 mg Nebulization BID  . busPIRone  5 mg Oral BID  . carvedilol  18.75 mg Oral BID WC  . cloNIDine  0.1 mg Oral BID  . clopidogrel  75 mg Oral Q breakfast  . dextromethorphan-guaiFENesin  1 tablet Oral BID  . ferrous sulfate  325 mg Oral BID  . heparin  5,000 Units Subcutaneous Q8H  . hydrALAZINE  100 mg Oral TID  . insulin aspart  0-9 Units Subcutaneous TID WC  . ipratropium  0.5 mg Nebulization QID  . isosorbide mononitrate  60 mg Oral Daily  . metoCLOPramide  5 mg Oral QID  . pantoprazole  40 mg Oral  Daily  . sertraline  100 mg Oral QHS  . torsemide  40 mg Oral Daily  . vitamin C  500 mg Oral Daily    Time spent on care of this patient: 20 mins   Esperanza Sheets, MD  [facility] Office  [phone] Pager [phone]   If 7PM-7AM, please contact night-coverage [email or web address] Password [facility] 10/18/2012, 9:51 AM   LOS: 12 days

## 2012-10-18 NOTE — Progress Notes (Signed)
Pt lying in bed No complaints of pain or sob. Agree with Ted Mcalpine assessment. Spoke with Case Mang and [facility] (tim) about Pt and setting up care post hp f/u.  Will continue to monitor Pt

## 2012-10-18 NOTE — Progress Notes (Signed)
Patient ID: [name], female   DOB: 04-14-1940, 72 y.o.   MRN: [id number]   SUBJECTIVE: Stable, denies dyspnea at rest.  BMET not back this morning.     Marland Kitchen albuterol  2.5 mg Nebulization QID  . amLODipine  10 mg Oral Daily  . antiseptic oral rinse  15 mL Mouth Rinse Daily  . arformoterol  15 mcg Nebulization BID  . aspirin  81 mg Oral Daily  . atorvastatin  40 mg Oral q1800  . budesonide  0.25 mg Nebulization BID  . busPIRone  5 mg Oral BID  . carvedilol  18.75 mg Oral BID WC  . cloNIDine  0.1 mg Oral BID  . clopidogrel  75 mg Oral Q breakfast  . dextromethorphan-guaiFENesin  1 tablet Oral BID  . ferrous sulfate  325 mg Oral BID  . heparin  5,000 Units Subcutaneous Q8H  . hydrALAZINE  100 mg Oral TID  . insulin aspart  0-9 Units Subcutaneous TID WC  . ipratropium  0.5 mg Nebulization QID  . isosorbide mononitrate  60 mg Oral Daily  . metoCLOPramide  5 mg Oral QID  . pantoprazole  40 mg Oral Daily  . sertraline  100 mg Oral QHS  . torsemide  40 mg Oral Daily  . vitamin C  500 mg Oral Daily      Filed Vitals:   10/17/12 2028 10/17/12 2340 10/18/12 0303 10/18/12 0600  BP: 139/43   165/64  Pulse: 60 62 57 60  Temp: 98.5 F (36.9 C)   98.6 F (37 C)  TempSrc: Oral   Oral  Resp: 18 16 14 17   Height:      Weight:    77.701 kg (171 lb 4.8 oz)  SpO2: 100% 97% 97% 96%    Intake/Output Summary (Last 24 hours) at 10/18/12 4098 Last data filed at 10/18/12 0600  Gross per 24 hour  Intake   1380 ml  Output   1751 ml  Net   -371 ml    LABS: Basic Metabolic Panel:  Recent Labs  11/91/47 0515  NA 135  K 5.2*  CL 100  CO2 24  GLUCOSE 95  BUN 69*  CREATININE 3.26*  CALCIUM 9.0   Liver Function Tests: No results found for this basename: AST, ALT, ALKPHOS, BILITOT, PROT, ALBUMIN,  in the last 72 hours No results found for this basename: LIPASE, AMYLASE,  in the last 72 hours CBC: No results found for this basename: WBC, NEUTROABS, HGB, HCT, MCV, PLT,  in the  last 72 hours Cardiac Enzymes: No results found for this basename: CKTOTAL, CKMB, CKMBINDEX, TROPONINI,  in the last 72 hours BNP: No components found with this basename: POCBNP,  D-Dimer: No results found for this basename: DDIMER,  in the last 72 hours Hemoglobin A1C: No results found for this basename: HGBA1C,  in the last 72 hours Fasting Lipid Panel: No results found for this basename: CHOL, HDL, LDLCALC, TRIG, CHOLHDL, LDLDIRECT,  in the last 72 hours Thyroid Function Tests: No results found for this basename: TSH, T4TOTAL, FREET3, T3FREE, THYROIDAB,  in the last 72 hours Anemia Panel: No results found for this basename: VITAMINB12, FOLATE, FERRITIN, TIBC, IRON, RETICCTPCT,  in the last 72 hours  RADIOLOGY: Dg Chest 1 View  10/06/2012   *RADIOLOGY REPORT*  Clinical Data: Shortness of breath.  Fluid overload.  Current history of chronic renal insufficiency, COPD, sleep apnea, asthma, hypertension, diabetes.  Prior history of CHF.  CHEST - 1 VIEW  Comparison:  Portable chest x-ray earlier same date 0732 hours, 09/13/2012, 09/03/2012, 03/17/2012.  Findings: Lateral view of the chest read in conjunction with the AP portable chest x-ray earlier today demonstrates suboptimal inspiration with crowded bronchovascular markings in the lower lobes.  Cardiac silhouette moderately to markedly enlarged. Pulmonary venous hypertension without overt edema.  Very small bilateral pleural effusions.  Degenerative changes involving the thoracic spine.  IMPRESSION: Cardiomegaly and pulmonary venous hypertension without overt edema. Small bilateral pleural effusions.   Original Report Authenticated By: Hulan Saas, M.D.   Nm Pulmonary Perf And Vent  10/07/2012   CLINICAL DATA:  Dyspnea.  EXAM: NUCLEAR MEDICINE VENTILATION - PERFUSION LUNG SCAN  TECHNIQUE: Ventilation images were obtained in multiple projections using inhaled aerosol technetium 99 M DTPA. Perfusion images were obtained in multiple projections  after intravenous injection of Tc-72m MAA.  COMPARISON:  None.  RADIOPHARMACEUTICALS:  40 mCi Tc-24m DTPA aerosol and 6 mCi Tc-84m MAA  FINDINGS: Ventilation: None diagnostic DTPA ventilation images the due to tracheostomy.  Perfusion: No wedge shaped peripheral perfusion defects to suggest acute pulmonary embolism.  IMPRESSION: No pulmonary perfusion defects to suggest acute pulmonary embolism.   Electronically Signed   By: Myles Rosenthal   On: 10/07/2012 14:22   Dg Chest Port 1 View  10/09/2012   *RADIOLOGY REPORT*  Clinical Data: Congestive heart failure.  PORTABLE CHEST - 1 VIEW  Comparison: 10/07/2012  Findings: Tracheostomy tube is midline with tip above the carina. The heart size appears moderately enlarged.  There are low lung volumes.  Decreased aeration to the left lung base is again noted and appears unchanged from previous exam.  Right lung appears clear.  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  IMPRESSION:  1.  Persistent decreased aeration to the left base which may represent atelectasis and/or infiltrate.   Original Report Authenticated By: Signa Kell, M.D.   Dg Chest Port 1 View  10/07/2012   *RADIOLOGY REPORT*  Clinical Data: Dyspnea  PORTABLE CHEST - 1 VIEW  Comparison: 10/06/2012  Findings: Cardiac enlargement stable.  Tracheostomy tube unchanged. Right lung is clear.  Cannot evaluate the retrocardiac area well on single AP view due to superimposition of cardiac silhouette.  There appears to be discoid atelectasis in the lingula.  The diaphragm is visible.  IMPRESSION: Discoid atelectasis lingula.  Cannot exclude retrocardiac opacities.  Right lung is clear.   Original Report Authenticated By: Esperanza Heir, M.D.   Dg Chest Portable 1 View  10/06/2012   *RADIOLOGY REPORT*  Clinical Data: Shortness of breath.  PORTABLE CHEST - 1 VIEW  Comparison: 09/13/2012.  Findings: The tracheostomy tube is in good position.  Heart is enlarged but stable.  Stable elevation of  the left hemidiaphragm. Mild chronic vascular congestion without overt pulmonary edema or pleural effusion.  IMPRESSION:  1.  Stable tracheostomy tube. 2.  Stable cardiac enlargement and mild chronic vascular congestion. 3.  No overt pulmonary edema, focal infiltrates or effusions.   Original Report Authenticated By: Rudie Meyer, M.D.   Dg Chest Port 1 View  09/13/2012   *RADIOLOGY REPORT*  Clinical Data: Shortness of breath.  Chronic respiratory failure.  PORTABLE CHEST - 1 VIEW  Comparison: 09/08/2012  Findings: Tracheostomy tube is unchanged.  Cardiomegaly.  Left lower lobe atelectasis or infiltrate appears increased since prior study.  No confluent opacity on the right.  No right effusion. Cannot exclude left effusion.  No acute bony abnormality.  IMPRESSION: Increasing left lower lobe atelectasis or infiltrate.  Cannot exclude a component  of effusion.   Original Report Authenticated By: Charlett Nose, M.D.    PHYSICAL EXAM General: NAD, chronically ill, trach Neck: JVP not elevated, no thyromegaly or thyroid nodule.  Lungs: Decreased breath sounds at bases bilaterally CV: Nondisplaced PMI.  Heart regular S1/S2, no S3/S4, no murmur.  No peripheral edema.   Abdomen: Soft, nontender, no hepatosplenomegaly, no distention.  Neurologic: Alert and oriented x 3.  Psych: Normal affect. Extremities: No clubbing or cyanosis.   TELEMETRY: Reviewed telemetry pt in NSR  ASSESSMENT AND PLAN: 72 yo with chronic respiratory failure/trach in setting of COPD and OHS/OSA also with chronic LLL collapse, CKD IV, and CHF presented with increased dyspnea and was thought to be volume overloaded.  She has been admitted 10+ times in the last year.  She has never had outpatient cardiology followup.  1. CHF: Acute on chronic.  EF 45% with moderate pulmonary HTN and RV failure on last echo.  Volume status is difficult but seems improved now.  She is significantly negative by total I/Os with weight down.   - Continue  Coreg, hydral/Imdur.  No ACEI with significant CKD.  - She was on torsemide 20 mg daily at admission.  This is an inadequate diuretic dose for her based on recurrent admissions with CHF exacerbations but creatinine rising on torsemide 60 mg daily.  If creatinine is stable, restart torsemide 40 mg daily today.   2. Chronic respiratory failure: COPD, OHS/OSA, chronic LLL collapse.  Needs ongoing pulmonary followup.  3. CKD stage IV: Rise in creatinine, as above held torsemide yesterday and will restart today if creatinine stable at 40 mg daily.  4. Patient needs better outpatient followup in terms of home health and MD appts (many, many admissions but no outpatient followup with cardiology or pulmonology ever!).  She needs to have scheduled appts with CHF clinic, nephrology, and pulmonology in place and discussed with whatever caregiver drives her (if she will not go to SNF) before she leaves the hospital.  She currently is not agreeable to SNF placement though I think this is her best option.  Palliative care has not been able to engage her daughter.  At this point, I do not think we are accomplishing much in the inpatient setting.    [name] 10/18/2012 8:08 AM

## 2012-10-18 NOTE — Progress Notes (Signed)
Patient ZO:[id number] [name] DOB: Feb 01, 1941 WUJ:[id number]    Attempted to have conversation with patient at bedside but she is resistant to have conversation without her daughter [name] present.  I offered information/ education on the importance of clarification of advanced directives and implementation of a successful care plan to enhance her goals of care and quality of her life.  Left message for daughter [name] and await return call.  Lorinda Creed NP  Palliative Medicine Team Team Phone # [phone] Pager [phone]

## 2012-10-18 NOTE — Clinical Social Work Psychosocial (Addendum)
    Clinical Social Work Department BRIEF PSYCHOSOCIAL ASSESSMENT 10/18/2012  Patient:  [name], [name]     Account Number:  [id number]     Admit date:  10/06/2012  Clinical Social Worker:  Tiburcio Pea  Date/Time:  10/16/2012 02:45 PM  Referred by:  Physician  Date Referred:  10/16/2012 Referred for  SNF Placement   Other Referral:   Interview type:  Patient Other interview type:    PSYCHOSOCIAL DATA Living Status:  FAMILY Admitted from facility:   Level of care:   Primary support name:  Sunday Corn Primary support relationship to patient:  CHILD, ADULT Degree of support available:   Strong support per patient    CURRENT CONCERNS Current Concerns  Post-Acute Placement   Other Concerns:   PT recomends    SOCIAL WORK ASSESSMENT / PLAN CSW met with patient at bedside to discuss Physical Therapy's recommendation for short term SNF.  Patient was very pleasant but adamantly refuses this option and states that she is going home at d/c. CSW discussed benefits for short term SNF for strengthening and trach care; she was appreciative but stated that other social workers in the past have tried to convince her to go to a "nursing home and I'm just  not gong to do it."  She feels that she manages well at home and her daughter and other family members are very supportive.  Patient appears to be alert and oriented and states that she "makes her own decisions". She does trust her daughter however and states that she does not want her to go to a nursing center.   Assessment/plan status:  No Further Intervention Required Other assessment/ plan:   Information/referral to community resources:   CSW offered SNF list but patient deferred.  She will agree to continued home health at home.    PATIENT'S/FAMILY'S RESPONSE TO PLAN OF CARE: Patient s alert, oriented and very pleasant. She related that she is aware that physical therapy wants her to go to SNF but she states she does not  want to do this.  She plans to return home with her daughter and Home health. She would not agree to a tentative bed search.  Patient verbalized that she was appreciative of everyone's concern and support but that she was managing well at home.  She stated that she woudl keep her daughter [name] notified and did not feel that CSW needed to contact her daughter at this time.

## 2012-10-19 LAB — GLUCOSE, CAPILLARY
Glucose-Capillary: 96 mg/dL (ref 70–99)
Glucose-Capillary: 102 mg/dL — ABNORMAL HIGH (ref 70–99)

## 2012-10-19 LAB — BASIC METABOLIC PANEL
BUN: 70 mg/dL — ABNORMAL HIGH (ref 6–23)
CO2: 22 mEq/L (ref 19–32)
Calcium: 8.9 mg/dL (ref 8.4–10.5)
Chloride: 100 mEq/L (ref 96–112)
Creatinine, Ser: 3.12 mg/dL — ABNORMAL HIGH (ref 0.50–1.10)
Glucose, Bld: 87 mg/dL (ref 70–99)

## 2012-10-19 NOTE — Progress Notes (Signed)
UR Completed. Acheron Sugg, RN, BSN Nurse Case Manager  [phone]  

## 2012-10-19 NOTE — Progress Notes (Signed)
  This NP with Palliative Medicine team will sign off at this time.  Unable to successfully contact daughter for conversation regarding GOC.  I see tentative plans for discharge today.  Please re consult [place] in the future if we may assist in this case   Lorinda Creed NP  Palliative Medicine Team Team Phone # [phone] Pager [phone]

## 2012-10-19 NOTE — Progress Notes (Signed)
Pt.A/Ox4 and is 1 person assist. She had no c/o pain and no signs of distress during the shift.

## 2012-10-19 NOTE — Progress Notes (Signed)
10/19/12 1135 Pt. is ready for discharge today, however pt. states her daughter, [name] is having dialysis today and will not be home, and pt. does not have a key to get into home.  This NCM attempted to reach [name] to make aware of pt. dc by calling her cell number [phone]), and pt. home number [phone]).  Left VM for [name] to return call to NCM about pt. dc.  Will await return call. Tera Mater, RN, BSN NCM [phone]

## 2012-10-19 NOTE — Progress Notes (Signed)
Patient ID: [name], female   DOB: 11-15-1940, 72 y.o.   MRN: [id number]   No changes from cardiology perspective today.  Creatinine seems fairly stable, would continue current cardiac meds including torsemide 40 daily.  [name] 10/19/2012

## 2012-10-19 NOTE — Progress Notes (Signed)
[facility] Progress Note [place] TEAM 1 - Stepdown/ICU TEAM   [name] YNW:[id number] DOB: [birth year] DOA: 10/06/2012 PCP: Gwynneth Aliment, MD  Brief narrative: 72 year old female, with history of Obesity, OSA, Asthma/COPD, chronic respiratory failure s/p Trach, on home O2 at 4L/Min, Chronic Cor Pulmonale, HTN, s/p CVA, Cardiomyopathy, CHF (EF 45% per 2D Echocardiogram 06/2012), CKD-4 open baseline creatinine 2.76), Gout, Dyslipidemia, chronic anemia/thrombocytopenia, diet-controlled DM-2, GERD, OA, who presented with progressive shortness of breath over 2-3 days, unassociated with chest pain, fever or chills. She had a mild cough productive of whitish phlegm, as well as progressive bilateral LE swelling. Denied abdominal pain, vomiting or diarrhea. Daughter brought her to the ED on 10/06/12, because of worsening symptoms. In the ED, she was administered iv Lasix and NTG infusion, with amelioration of symptoms. Her acute respiratory failure was due to decompensated CHF complicating underlying chronic respiratory failure/tracheostomy, COPD, OHS/OSA and chronic LLL collapse. VQ scan negative. Cardiology, nephrology consulted. Patient was aggressively diuresed- approximately 15 L negative. Nephrology did not think that she was a candidate for RRT. Patient has clinically improved but is at high risk for recurrent decline and hospitalization. She declines SNF admission. Palliative care consultation for goals of care is being adressed. SW contacted agreed with family; i called no response  - plan to d/c on 8/29 AM home with HHC, f/u appointments; refused snf   Assessment/Plan:  Dyspnea/Respiratory failure, acute on chronic - Trach dependent with history of OSA/OHS, COPD/asthma, chronic LLL collapse, chronic combined CHF - Multifactorial, with volume overload as primary contributor - see other issues detailed below - Says breathing is getting better. Looks comfortable. DOE - Palliative  consulted for GOC, appreciate input   Acute on Chronic CHF - systolic + R heart failure / mod pulm HTN - EF 45% with hypokinesis and grade 2 diastolic dysfunction per echo 07/5782 - 15 L net negative since admit - diuretic dosing per Cardiology/CHF Team  -   on torsemide 60 mg daily, adjust based on renal function. Continue Coreg, hydralazine & Imdur. No ACE inhibitor with  significant CKD. - Creatinine has increased today and mild hyperkalemia. Hold torsemide today and if creatinine better, resume at 40  mg daily.  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    Anemia of chronic disease in setting of CKD - Hb 8.8 on admission - holding relatively steady at this time - no evidence of acute blood loss  DM - Reasonably well controlled at present - no change in tx plan today   GERD  Gout - Well compensated at this time   HTN - controlled at present. On clonidine, amlodipine, carvedilol & hydralazine.    Code Status: FULL Family Communication:  Previously Unable to reach patient's daughter despite multiple attempts by M.D., charge nurse on the floor, palliative care team. -will call her daughter today  Disposition Plan: Patient has had several hospitalizations and no outpatient followup. PT and OT had initially recommended SNF which patient and daughter declined. Subsequently, PT and OT recommend home with home health therapies. Given her multiple comorbidities and potential for decline again, palliative care has been consulted for goals of care. Disposition pending GOC meeting.  Consultants: Nephrology Cardiolgoy Palliative Care Medicine: will see 8/26.  Procedures:  Foley-discontinued  Antibiotics:  none  DVT prophylaxis: SQ heparin  HPI/Subjective: Denies complaints.  Objective: Blood pressure  150/48, pulse 59, temperature 98.7 F (37.1 C), temperature source Oral, resp. rate 16, height 5\' 4"  (1.626 m), weight 78.6 kg (173 lb 4.5 oz), SpO2 96.00%.  Intake/Output Summary (Last 24 hours) at 10/19/12 1150 Last data filed at 10/19/12 1019  Gross per 24 hour  Intake   1326 ml  Output   1701 ml  Net   -375 ml   Exam: General: Moderately built and overweight female patient lying in bed comfortably.  Lungs:  clear to auscultation. No increased work of breathing. Tracheostomy +  Cardiovascular: Regular rate and rhythm without murmur gallop or rub normal S1 and S2. 1+ bilateral leg edema. Tele: SB 50's-SR in 60's. Abdomen: Nontender, nondistended, soft, bowel sounds positive, no rebound, no ascites, no appreciable mass Extremities: No significant cyanosis, clubbing, or edema bilateral lower extremities CNS: Alert and oriented. Hard of hearing- says her hearing aide battery has died.  Data Reviewed: Basic Metabolic Panel:  Recent Labs Lab 10/13/12 0445 10/14/12 0445 10/15/12 0515 10/17/12 0515 10/18/12 0645 10/19/12 0423  NA 136 134* 135 135 134* 134*  K 4.5 4.8 4.6 5.2* 5.1 5.1  CL 98 97 98 100 100 100  CO2 26 26 28 24 24 22   GLUCOSE 103* 96 96 95 90 87  BUN 54* 58* 62* 69* 68* 70*  CREATININE 3.16* 2.92* 3.04* 3.26* 3.30* 3.12*  CALCIUM 9.4 9.2 8.9 9.0 9.1 8.9  MG 2.4  --   --   --   --   --   PHOS 4.3 4.0  --   --   --   --    Liver Function Tests:  Recent Labs Lab 10/13/12 0445 10/14/12 0445  ALBUMIN 2.8* 2.6*   CBC:  Recent Labs Lab 10/14/12 0445  WBC 9.6  HGB 8.8*  HCT 27.6*  MCV 82.1  PLT 222   Cardiac Enzymes: No results found for this basename: CKTOTAL, CKMB, CKMBINDEX, TROPONINI,  in the last 168 hours BNP (last 3 results)  Recent Labs  10/09/12 1045 10/09/12 2302 10/11/12 0500  PROBNP 3863.0* 3450.0* 2714.0*   CBG:  Recent Labs Lab 10/18/12 0601 10/18/12 1157 10/18/12 1747 10/18/12 2101 10/19/12 0553  GLUCAP 96 118* 130* 145*  96    No results found for this or any previous visit (from the past 240 hour(s)).   Studies:  Recent x-ray studies have been reviewed in detail by the Attending Physician  Scheduled Meds:  Scheduled Meds: . albuterol  2.5 mg Nebulization QID  . amLODipine  10 mg Oral Daily  . antiseptic oral rinse  15 mL Mouth Rinse Daily  . arformoterol  15 mcg Nebulization BID  . aspirin  81 mg Oral Daily  . atorvastatin  40 mg Oral q1800  . budesonide  0.25 mg Nebulization BID  . busPIRone  5 mg Oral BID  . carvedilol  18.75 mg Oral BID WC  . cloNIDine  0.1 mg Oral BID  . clopidogrel  75 mg Oral Q breakfast  . dextromethorphan-guaiFENesin  1 tablet Oral BID  . ferrous sulfate  325 mg Oral BID  . heparin  5,000 Units Subcutaneous Q8H  . hydrALAZINE  100 mg Oral TID  . insulin aspart  0-9 Units Subcutaneous TID WC  . ipratropium  0.5 mg Nebulization QID  . isosorbide mononitrate  60 mg Oral Daily  . metoCLOPramide  5 mg Oral QID  . pantoprazole  40 mg  Oral Daily  . sertraline  100 mg Oral QHS  . sodium chloride  3 mL Intravenous Q12H  . torsemide  40 mg Oral Daily  . vitamin C  500 mg Oral Daily    Time spent on care of this patient: 20 mins   Esperanza Sheets, MD  [facility] Office  [phone] Pager [phone]   If 7PM-7AM, please contact night-coverage [email or web address] Password [facility] 10/19/2012, 11:50 AM   LOS: 13 days

## 2012-10-20 LAB — GLUCOSE, CAPILLARY
Glucose-Capillary: 91 mg/dL (ref 70–99)
Glucose-Capillary: 100 mg/dL — ABNORMAL HIGH (ref 70–99)

## 2012-10-20 MED ORDER — TORSEMIDE 20 MG PO TABS: 40.0000 mg | ORAL_TABLET | Freq: Every day | ORAL | Status: DC

## 2012-10-20 MED ORDER — CARVEDILOL 6.25 MG PO TABS: 18.7500 mg | ORAL_TABLET | Freq: Two times a day (BID) | ORAL | Status: DC

## 2012-10-20 NOTE — Discharge Summary (Signed)
Physician Discharge Summary  [name] WUJ:[id number] DOB: [birth year] DOA: 10/06/2012  PCP: Gwynneth Aliment, MD  Admit date: 10/06/2012 Discharge date: 10/20/2012  Time spent: >25 minutes  minutes  Recommendations for Outpatient Follow-up:   Appointments schedule; Dr. Mellody Dance 10/25/2012; [facility] 10/27/2012; Dr. Allyne Gee 10/26/2012; and cardiology in 1 week   Discharge Diagnoses:  Active Problems:   Dyspnea   Respiratory failure, acute and chronic   CHF (congestive heart failure)   COPD (chronic obstructive pulmonary disease)   CKD (chronic kidney disease), stage IV   DM (diabetes mellitus)   GERD (gastroesophageal reflux disease)   Discharge Condition: stable   Diet recommendation: heart healthy   Filed Weights   10/19/12 0500 10/19/12 0625 10/20/12 0523  Weight: 78.9 kg (173 lb 15.1 oz) 78.6 kg (173 lb 4.5 oz) 78.155 kg (172 lb 4.8 oz)    History of present illness:  Brief narrative:  72 year old female, with history of Obesity, OSA, Asthma/COPD, chronic respiratory failure s/p Trach, on home O2 at 4L/Min, Chronic Cor Pulmonale, HTN, s/p CVA, Cardiomyopathy, CHF (EF 45% per 2D Echocardiogram 06/2012), CKD-4 open baseline creatinine 2.76), Gout, Dyslipidemia, chronic anemia/thrombocytopenia, diet-controlled DM-2, GERD, OA, who presented with progressive shortness of breath over 2-3 days, unassociated with chest pain, fever or chills. She had a mild cough productive of whitish phlegm, as well as progressive bilateral LE swelling. Denied abdominal pain, vomiting or diarrhea. Daughter brought her to the ED on 10/06/12, because of worsening symptoms. In the ED, she was administered iv Lasix and NTG infusion, with amelioration of symptoms. Her acute respiratory failure was due to decompensated CHF complicating underlying chronic respiratory failure/tracheostomy, COPD, OHS/OSA and chronic LLL collapse. VQ scan negative. Cardiology, nephrology consulted. Patient was aggressively  diuresed- approximately 15 L negative. Nephrology did not think that she was a candidate for RRT. Patient has clinically improved but is at high risk for recurrent decline and hospitalization. She declines SNF admission. Palliative care consultation for goals of care is being adressed. SW contacted agreed with family; i called no response   After multiple discussions family rejected SNF; patient is being d/c home instable condition, appointment has been made to follow up;    Hospital Course:   Assessment/Plan:  Dyspnea/Respiratory failure, acute on chronic  - Trach dependent with history of OSA/OHS, COPD/asthma, chronic LLL collapse, chronic combined CHF  - Multifactorial, with volume overload as primary contributor - see other issues detailed below  - Says breathing is getting better. Looks comfortable.  - Palliative consulted for GOC, but daughter did not returned calls  Acute on Chronic CHF - systolic + R heart failure / mod pulm HTN  - EF 45% with hypokinesis and grade 2 diastolic dysfunction per echo 10/5619  - 15 L net negative since admit - diuretic dosing per Cardiology/CHF Team  - was on torsemide 60 mg daily, adjusted based on renal function. Continue Coreg, hydralazine & Imdur. No ACE inhibitor with significant CKD.  -resumed torsemide at 40 mg daily.  COPD  - Well compensated at present / no wheezing on exam  OHS/OSA  - Trach dependent  Chronic LLL collapse  - Stable via CXR  CKD stage IV  - baseline Cr appears to be 2.5-2.8 - No ACEI - Nephrology signed off.  Anemia of chronic disease in setting of CKD  - Hb 8.8 on admission - holding relatively steady at this time - no evidence of acute blood loss  Gout  - Well compensated at this time  HTN  - controlled at present. On clonidine, amlodipine, carvedilol & hydralazine.     Procedures:  Echo  (i.e. Studies not automatically included, echos, thoracentesis, etc; not x-rays)  Consultations:  Nephrology, cardiology,  palliative care   Discharge Exam: Filed Vitals:   10/20/12 0946  BP: 128/62  Pulse:   Temp:   Resp:     General: alert oriented  Cardiovascular: S1, S2; RRR Respiratory: CTA BL   Discharge Instructions  Discharge Orders   Future Appointments Provider Department Dept Phone   10/25/2012 11:15 AM Nyoka Cowden, MD [facility] [phone]   10/26/2012 3:40 PM [facility] [phone]   11/08/2012 3:15 PM Barbaraann Share, MD [facility] [phone]   Future Orders Complete By Expires   Diet - low sodium heart healthy  As directed    Discharge instructions  As directed    Scheduling Instructions:     Follow up Dr. Marcelyn Bruins on 10/25/2012 at 11.15 Follow up with [facility] on 10/27/2012 at 10.45 Follow up with Dr. Grayling Congress on 9/4 at 10.30   Increase activity slowly  As directed        Medication List         albuterol (5 MG/ML) 0.5% nebulizer solution  Commonly known as:  PROVENTIL  Take 0.5 mLs (2.5 mg total) by nebulization 4 (four) times daily.     amLODipine 10 MG tablet  Commonly known as:  NORVASC  Take 10 mg by mouth daily.     arformoterol 15 MCG/2ML Nebu  Commonly known as:  BROVANA  Take 2 mLs (15 mcg total) by nebulization 2 (two) times daily.     aspirin 81 MG chewable tablet  Chew 1 tablet (81 mg total) by mouth daily.     atorvastatin 40 MG tablet  Commonly known as:  LIPITOR  Take 1 tablet (40 mg total) by mouth daily.     BIOTENE MOISTURIZING MOUTH Soln  Use as directed 1 spray in the mouth or throat daily.     budesonide 0.25 MG/2ML nebulizer solution  Commonly known as:  PULMICORT  Take 2 mLs (0.25 mg total) by nebulization 2 (two) times daily.     busPIRone 5 MG tablet  Commonly known as:  BUSPAR  Take 5 mg by mouth 2 (two) times daily.     carvedilol 6.25 MG tablet  Commonly known as:  COREG  Take 3 tablets (18.75 mg total) by mouth 2 (two) times daily with a  meal.     cloNIDine 0.2 MG tablet  Commonly known as:  CATAPRES  Take 0.5 tablets (0.1 mg total) by mouth 2 (two) times daily.     clopidogrel 75 MG tablet  Commonly known as:  PLAVIX  Take 1 tablet (75 mg total) by mouth daily with breakfast.     dextromethorphan-guaiFENesin 30-600 MG per 12 hr tablet  Commonly known as:  MUCINEX DM  Take 1 tablet by mouth 2 (two) times daily.     ferrous sulfate 325 (65 FE) MG EC tablet  Take 1 tablet (325 mg total) by mouth 2 (two) times daily.     hydrALAZINE 100 MG tablet  Commonly known as:  APRESOLINE  Take 100 mg by mouth 3 (three) times daily.     ipratropium 0.02 % nebulizer solution  Commonly known as:  ATROVENT  Take 2.5 mLs (0.5 mg total) by nebulization 4 (four) times daily.     isosorbide  mononitrate 30 MG 24 hr tablet  Commonly known as:  IMDUR  Take 1 tablet (30 mg total) by mouth daily.     LORazepam 0.5 MG tablet  Commonly known as:  ATIVAN  Take 1 tablet (0.5 mg total) by mouth every 6 (six) hours as needed for anxiety.     metoCLOPramide 5 MG tablet  Commonly known as:  REGLAN  Take 5 mg by mouth 4 (four) times daily.     oxycodone 5 MG capsule  Commonly known as:  OXY-IR  Take 5 mg by mouth every 6 (six) hours as needed (for moderate pain).     pantoprazole 20 MG tablet  Commonly known as:  PROTONIX  Take 40 mg by mouth daily.     sertraline 100 MG tablet  Commonly known as:  ZOLOFT  Take 100 mg by mouth at bedtime.     torsemide 20 MG tablet  Commonly known as:  DEMADEX  Take 2 tablets (40 mg total) by mouth daily.     VITAMIN C PO  Take 1 tablet by mouth daily.       Allergies  Allergen Reactions  . Other Itching    "Wool"  . Sulfonamide Derivatives Hives and Rash       Follow-up Information   Follow up with Barbaraann Share, MD On 10/25/2012. (@ 11:15 am)    Specialty:  Pulmonary Disease   Contact information:   [address] [place] [address] [phone]       Follow up with Arvilla Meres, MD In 1 week. (@ 10:40 am [employer] 0002)    Specialty:  Cardiology   Contact information:   [address] [place] [address] [phone]       Follow up with [facility]. (home health nurse, physical therapy, occupational therapy, and aide)    Contact information:   [phone]      Follow up with [facility] On 10/27/2012. (@ 10:45 AM PLEASE call 24 hrs in advance to cancel appt. to avoid a No Call/No Show Fee-not covered by insurance)    Contact information:   [address] [place] [address] [phone]      Follow up with Gwynneth Aliment, MD On 10/26/2012. (@ 10:30 spoke with Danella Deis)    Specialty:  Internal Medicine   Contact information:   [address] [place] [address] [phone]       Follow up with Gwynneth Aliment, MD.   Specialty:  Internal Medicine   Contact information:   [address] [place] [address] [phone]       Follow up with [name], MD In 1 week.   Specialty:  Cardiology   Contact information:   [address] [place] [address] [phone]        The results of significant diagnostics from this hospitalization (including imaging, microbiology, ancillary and laboratory) are listed below for reference.    Significant Diagnostic Studies: Dg Chest 1 View  10/06/2012   *RADIOLOGY REPORT*  Clinical Data: Shortness of breath.  Fluid overload.  Current history of chronic renal insufficiency, COPD, sleep apnea, asthma, hypertension, diabetes.  Prior history of CHF.  CHEST - 1 VIEW  Comparison: Portable chest x-ray earlier same date 0732 hours, 09/13/2012, 09/03/2012, 03/17/2012.  Findings: Lateral view of the chest read in conjunction with the AP portable chest x-ray earlier today demonstrates suboptimal inspiration with crowded bronchovascular markings in the lower  lobes.  Cardiac silhouette moderately to markedly enlarged. Pulmonary  venous hypertension without overt edema.  Very small bilateral pleural effusions.  Degenerative changes involving the thoracic spine.  IMPRESSION: Cardiomegaly and pulmonary venous hypertension without overt edema. Small bilateral pleural effusions.   Original Report Authenticated By: Hulan Saas, M.D.   Nm Pulmonary Perf And Vent  10/07/2012   CLINICAL DATA:  Dyspnea.  EXAM: NUCLEAR MEDICINE VENTILATION - PERFUSION LUNG SCAN  TECHNIQUE: Ventilation images were obtained in multiple projections using inhaled aerosol technetium 99 M DTPA. Perfusion images were obtained in multiple projections after intravenous injection of Tc-40m MAA.  COMPARISON:  None.  RADIOPHARMACEUTICALS:  40 mCi Tc-56m DTPA aerosol and 6 mCi Tc-49m MAA  FINDINGS: Ventilation: None diagnostic DTPA ventilation images the due to tracheostomy.  Perfusion: No wedge shaped peripheral perfusion defects to suggest acute pulmonary embolism.  IMPRESSION: No pulmonary perfusion defects to suggest acute pulmonary embolism.   Electronically Signed   By: Myles Rosenthal   On: 10/07/2012 14:22   Dg Chest Port 1 View  10/09/2012   *RADIOLOGY REPORT*  Clinical Data: Congestive heart failure.  PORTABLE CHEST - 1 VIEW  Comparison: 10/07/2012  Findings: Tracheostomy tube is midline with tip above the carina. The heart size appears moderately enlarged.  There are low lung volumes.  Decreased aeration to the left lung base is again noted and appears unchanged from previous exam.  Right lung appears clear.  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  IMPRESSION:  1.  Persistent decreased aeration to the left base which may represent atelectasis and/or infiltrate.   Original Report Authenticated By: Signa Kell, M.D.   Dg Chest Port 1 View  10/07/2012   *RADIOLOGY REPORT*  Clinical Data: Dyspnea  PORTABLE CHEST - 1 VIEW  Comparison: 10/06/2012  Findings: Cardiac enlargement stable.  Tracheostomy tube unchanged. Right lung is clear.   Cannot evaluate the retrocardiac area well on single AP view due to superimposition of cardiac silhouette.  There appears to be discoid atelectasis in the lingula.  The diaphragm is visible.  IMPRESSION: Discoid atelectasis lingula.  Cannot exclude retrocardiac opacities.  Right lung is clear.   Original Report Authenticated By: Esperanza Heir, M.D.   Dg Chest Portable 1 View  10/06/2012   *RADIOLOGY REPORT*  Clinical Data: Shortness of breath.  PORTABLE CHEST - 1 VIEW  Comparison: 09/13/2012.  Findings: The tracheostomy tube is in good position.  Heart is enlarged but stable.  Stable elevation of the left hemidiaphragm. Mild chronic vascular congestion without overt pulmonary edema or pleural effusion.  IMPRESSION:  1.  Stable tracheostomy tube. 2.  Stable cardiac enlargement and mild chronic vascular congestion. 3.  No overt pulmonary edema, focal infiltrates or effusions.   Original Report Authenticated By: Rudie Meyer, M.D.    Microbiology: No results found for this or any previous visit (from the past 240 hour(s)).   Labs: Basic Metabolic Panel:  Recent Labs Lab 10/14/12 0445 10/15/12 0515 10/17/12 0515 10/18/12 0645 10/19/12 0423  NA 134* 135 135 134* 134*  K 4.8 4.6 5.2* 5.1 5.1  CL 97 98 100 100 100  CO2 26 28 24 24 22   GLUCOSE 96 96 95 90 87  BUN 58* 62* 69* 68* 70*  CREATININE 2.92* 3.04* 3.26* 3.30* 3.12*  CALCIUM 9.2 8.9 9.0 9.1 8.9  PHOS 4.0  --   --   --   --    Liver Function Tests:  Recent Labs Lab 10/14/12 0445  ALBUMIN 2.6*  No results found for this basename: LIPASE, AMYLASE,  in the last 168 hours No results found for this basename: AMMONIA,  in the last 168 hours CBC:  Recent Labs Lab 10/14/12 0445  WBC 9.6  HGB 8.8*  HCT 27.6*  MCV 82.1  PLT 222   Cardiac Enzymes: No results found for this basename: CKTOTAL, CKMB, CKMBINDEX, TROPONINI,  in the last 168 hours BNP: BNP (last 3 results)  Recent Labs  10/09/12 1045 10/09/12 2302  10/11/12 0500  PROBNP 3863.0* 3450.0* 2714.0*   CBG:  Recent Labs Lab 10/19/12 0553 10/19/12 1139 10/19/12 1642 10/19/12 2134 10/20/12 0608  GLUCAP 96 102* 110* 104* 94       Signed:  Diamond Martucci N  [facility] 10/20/2012, 10:26 AM

## 2012-10-20 NOTE — Progress Notes (Signed)
OT Cancellation Note  Patient Details Name: [name] MRN: [id number] DOB: 04-Dec-1940   Cancelled Treatment:    Reason Eval/Treat Not Completed: Patient declined--I'm not getting up because I do not want to, I am going home today". Made the patient aware how important it is to get up and move and not just lay in bed.  [name] [phone] 10/20/2012, 10:03 AM

## 2012-10-20 NOTE — Progress Notes (Signed)
PT Cancellation Note  Patient Details Name: [name] MRN: [id number] DOB: 11/25/1940   Cancelled Treatment:     Pt refusing therapy at this time.  States she is going home today.      Verdell Face, [place] [phone] 10/20/2012

## 2012-10-20 NOTE — Progress Notes (Signed)
RT called to pt's room due to trach being out.  Arrived to room to find patient alone in room with no 02 on and the trach on the bedside table.  Pt Spo2 98%.  Placed #6 cuffless shiley in stoma without difficulty.  Co2 detector + for color change.  Placed obturator at [facility].  Suctioned pt for small, clear-yellow thick secretions.  PMV on trach.  Pt doing well and VS stable.  Will continue to monitor.

## 2012-10-20 NOTE — Progress Notes (Signed)
The patient was discharged via a wheelchair and her ho me oxygen tank hooked up to her trach collar.  A nurse tech helped the daughter put the patient in the car.

## 2012-10-20 NOTE — Progress Notes (Signed)
D/c instructions given to pt before 130pm and unable to reach daughter to pick her up.   Florence Canner made aware and attempted to reach pt's  daughter, but unable.   Will continue to monitor.  Amanda Pea, Charity fundraiser.

## 2012-10-20 NOTE — Progress Notes (Signed)
Pt.A/Ox4 and is one person assist. She had no c/o pain. Around 0720 day shift RN and I were walking down the hall when we were called and notified by secretary that room 7 had called for assistance but didn't say what she needed. So when day shift RN and I entered the room we seen patient sitting on the bed with her trach in her hand. Janina Mayo was then placed on the beside table. Charged RN was notified and asked for respiratory therapist's number. Chelsea, RT was then immediately called and notified. Charge RN during this time also contacted rapid response. Pt.was on continuous pulse ox and oxygen saturation remained 96% to 98% without trach. Obturator was already at the bedside above patient's bed. RT came in and placed new trach in place. RN with rapid response also came to assess patient. Was told afterwards that patient said that she had coughed out her trach. Patient received trach care earlier at [phone] but had no complications noted at the time.

## 2012-10-20 NOTE — Discharge Instructions (Signed)
Heart Failure  Heart failure is a condition in which the heart has trouble pumping blood. This means your heart does not pump blood efficiently for your body to work well. In some cases of heart failure, fluid may back up into your lungs or you may have swelling (edema) in your lower legs. Heart failure is a long-term (chronic) condition. It is important for you to take good care of yourself and follow your caregiver's treatment plan.  CAUSES    Health conditions:   High blood pressure (hypertension) causes the heart muscle to work harder than normal. When pressure in the blood vessels is high, the heart needs to pump (contract) with more force in order to circulate blood throughout the body. High blood pressure eventually causes the heart to become stiff and weak.   Coronary artery disease (CAD) is the buildup of cholesterol and fat (plaque) in the arteries of the heart. The blockage in the arteries deprives the heart muscle of oxygen and blood. This can cause chest pain and may lead to a heart attack. High blood pressure can also contribute to CAD.   Heart attack (myocardial infarction) occurs when 1 or more arteries in the heart become blocked. The loss of oxygen damages the muscle tissue of the heart. When this happens, part of the heart muscle dies. The injured tissue does not contract as well and weakens the heart's ability to pump blood.   Abnormal heart valves can cause heart failure when the heart valves do not open and close properly. This makes the heart muscle pump harder to keep the blood flowing.   Heart muscle disease (cardiomyopathy or myocarditis) is damage to the heart muscle from a variety of causes. These can include drug or alcohol abuse, infections, or unknown reasons. These can increase the risk of heart failure.   Lung disease makes the heart work harder because the lungs do not work properly. This can cause a strain on the heart, leading it to fail.    Diabetes increases the risk of heart failure. High blood sugar contributes to high fat (lipid) levels in the blood. Diabetes can also cause slow damage to tiny blood vessels that carry important nutrients to the heart muscle. When the heart does not get enough oxygen and food, it can cause the heart to become weak and stiff. This leads to a heart that does not contract efficiently.   Other diseases can contribute to heart failure. These include abnormal heart rhythms, thyroid problems, and low blood counts (anemia).   Unhealthy behaviors:   Being overweight.   Smoking or chewing tobacco.   Eating foods high in fat and cholesterol.   Eating foods or drinking beverages high in salt.   Abusing drugs or alcohol.   Lacking physical activity.  SYMPTOMS   Heart failure symptoms may vary and can be hard to detect. Symptoms may include:   Shortness of breath with activity, such as climbing stairs.   Persistent cough.   Swelling of the feet, ankles, legs, or abdomen.   Unexplained, sudden weight gain.   Difficulty breathing when lying flat (orthopnea).   Waking from sleep because of the need to sit up and get more air.   Rapid heartbeat.   Fatigue and loss of energy.   Feeling lightheaded, dizzy, or close to fainting.   Loss of appetite.   Nausea.   Increased urination during the night (nocturia).  DIAGNOSIS   A diagnosis of heart failure is based on your history, symptoms, physical   examination, and diagnostic tests.  Diagnostic tests for heart failure may include:   Electrocardiography.   Chest X-ray.   Blood tests.   Exercise stress test.   Echocardiography.   Cardiac angiography.   Radionuclide scans.  TREATMENT   Treatment is aimed at managing the symptoms of heart failure. Medicines, behavioral changes, or surgical intervention may be necessary to treat heart failure.   Medicines to help treat heart failure may include:    Angiotensin-converting enzyme (ACE) inhibitors. This type of medicine blocks the effects of a blood protein called angiotensin-converting enzyme. ACE inhibitors relax (dilate) the blood vessels and help lower blood pressure.   Angiotensin receptor blockers. This type of medicine blocks the actions of a blood protein called angiotensin. Angiotensin receptor blockers dilate the blood vessels and help lower blood pressure.   Aldosterone antagonists. This type of medicine helps rid your body of extra fluid. This loss of extra fluid lowers the volume of blood the heart pumps.   Water pills (diuretics). Diuretics cause the kidneys to remove salt and water from the blood. The extra fluid is removed through urination.   Beta blockers. These prevent the heart from beating too fast and improve heart muscle strength.   Digitalis. This increases the force of the heartbeat.   Healthy behavior changes include:   Obtaining and maintaining a healthy weight.   Stopping smoking or chewing tobacco.   Eating heart healthy foods.   Limiting or avoiding alcohol.   Stopping drug use.   Becoming as physically active as possible.   Surgical treatment for heart failure may include:   A procedure to open blocked arteries, repair damaged heart valves, or remove damaged heart muscle tissue.   A pacemaker to improve heart muscle function and control certain abnormal heart rhythms.   An internal cardioverter defibrillator to possibly prevent sudden cardiac death.   A left ventricular assist device to assist the pumping ability of the heart.  HOME CARE INSTRUCTIONS    Take your medicine as directed by your caregiver. Medicines are important in reducing the workload of your heart, slowing the progression of heart failure, and improving your symptoms.   Do not stop taking your medicine unless directed by your caregiver.   Do not skip any dose of medicine.    Refill your prescriptions before you run out of medicine. Your medicines are needed every day.   Take over-the-counter medicine only as directed by your caregiver or pharmacist.   Stay physically active. Your caregiver or cardiac rehabilitation program can help determine your level of physical activity. It is important to follow your specific physical activity program. Regular physical activity can control weight and improve your energy, endurance, health, cholesterol levels, mood, and nighttime sleep. It is important to pace your physical activity to avoid shortness of breath or chest pain. It is recommended to rest for 1 hour before and after meals.   Eat heart healthy foods. Food choices should be low in saturated fat, trans fat, cholesterol, and salt (sodium). Healthy choices include fresh or frozen fruits and vegetables, fish, lean meats, legumes, fat-free or low-fat dairy products, and whole grain or high fiber foods. Limit your sodium to 1500 milligrams (mg) each day or as recommended by your caregiver. Talk to a dietitian to learn more about heart healthy foods and healthy seasonings.   Use healthy cooking methods. Healthy cooking methods include roasting, grilling, broiling, baking, poaching, steaming, or stir-frying. Talk to a dietitian to learn more about healthy cooking methods.     Limit fluids if directed by your caregiver. Fluid restriction may reduce symptoms of heart failure in some individuals.   Weigh yourself every day. Daily weights are important in the early recognition of excess fluid. You should weigh yourself every morning after you urinate and before you eat breakfast. Wear the same amount of clothing each time you weigh yourself. Record your daily weight. Provide your caregiver with your weight record.   Monitor and record your blood pressure if directed by your caregiver.   Check your pulse if directed by your caregiver.    If you are overweight, it is time to lose and maintain a healthy weight.   Stop smoking or chewing tobacco. Nicotine makes your heart work harder by causing your blood vessels to constrict. Do not use nicotine gum or patches before talking to your caregiver.   Schedule and attend follow-up visits as directed by your caregiver. It is important to keep all your appointments.   Limit alcohol intake to no more than 1 drink per day for nonpregnant women and 2 drinks per day for men. Drinking more than that is harmful to your heart. Tell your caregiver if you drink alcohol several times a week. Talk with your caregiver about whether alcohol is safe for you. If your heart has already been damaged by alcohol or you have severe heart failure, drinking alcohol should be stopped completely.   Stop illegal drug use.   Stay up-to-date with immunizations. It is especially important to prevent respiratory infections through current pneumococcal and influenza immunizations.   Manage other health conditions such as hypertension, diabetes, thyroid disease, or abnormal heart rhythms as directed by your caregiver.   Learn to manage stress.   Plan rest periods when fatigued.   Learn strategies to manage high temperatures. If the weather is extremely hot:   Avoid vigorous physical activity.   Use air conditioning or fans or seek a cooler location.   Avoid caffeine and alcohol.   Wear loose-fitting, lightweight, and light-colored clothing.   Learn strategies to manage cold temperatures. If the weather is extremely cold:   Avoid vigorous physical activity.   Layer clothes.   Wear mittens or gloves, a hat, and a scarf when going outside.   Avoid alcohol.   Obtain ongoing education and support as needed.   Participate or seek rehabilitation as needed to maintain or improve independence and quality of life.  SEEK MEDICAL CARE IF:    Your weight increases by 3 lb/1.4 kg in 1 day or 5 lb/2.3 kg in a week.    You have increasing shortness of breath that is unusual for you.   You are unable to participate in your usual physical activities.   You tire easily.   You cough more than normal, especially with physical activity.   You have any or more swelling in areas such as your hands, feet, ankles, or abdomen.   You are unable to sleep because it is hard to breathe.   You cough up bloody mucus (sputum).   You feel like your heart is beating fast (palpitations).   You become dizzy or lightheaded upon standing up.  SEEK IMMEDIATE MEDICAL CARE IF:    You have difficulty breathing.   There is a change in mental status such as decreased alertness or difficulty with concentration.   You have a pain or discomfort in your chest.   You have an episode of fainting (syncope).   You are in an area of high temperatures and   you have signs of heat exhaustion:   Heavy sweating.   Muscle cramps.   Weakness.   Dizziness.   Headaches.   Fainting.   You are in an area of cold temperatures and you have signs of hypothermia:   Clumsiness.   Confusion.   Sleepiness.   Shivering.  MAKE SURE YOU:    Understand these instructions.   Will watch your condition.   Will get help right away if you are not doing well or get worse.  Document Released: 02/08/2005 Document Revised: 01/26/2012 Document Reviewed: 09/09/2011  [employer] Patient Information 2014 [employer].

## 2012-10-20 NOTE — Progress Notes (Signed)
Pt's daughter has not arrived to pick her up at this time.  Pt stated "she is still at the day care and will come after."  On coming nurse aware.  Amanda Pea, Charity fundraiser.

## 2012-10-25 ENCOUNTER — Other Ambulatory Visit: Payer: Self-pay | Admitting: Internal Medicine

## 2012-10-25 ENCOUNTER — Inpatient Hospital Stay: Payer: Self-pay | Admitting: Internal Medicine

## 2012-10-26 ENCOUNTER — Ambulatory Visit (HOSPITAL_COMMUNITY): Payer: Medicare Other

## 2012-11-01 ENCOUNTER — Ambulatory Visit (HOSPITAL_COMMUNITY): Admission: RE | Admit: 2012-11-01 | Payer: Medicare Other | Source: Ambulatory Visit

## 2012-11-03 ENCOUNTER — Telehealth (HOSPITAL_COMMUNITY): Payer: Self-pay | Admitting: Anesthesiology

## 2012-11-03 ENCOUNTER — Ambulatory Visit (HOSPITAL_COMMUNITY)
Admission: RE | Admit: 2012-11-03 | Discharge: 2012-11-03 | Disposition: A | Discharge status: Home / Self Care | Payer: Medicare Other | Source: Ambulatory Visit | Attending: Internal Medicine | Admitting: Internal Medicine

## 2012-11-03 ENCOUNTER — Encounter (HOSPITAL_COMMUNITY): Payer: Self-pay

## 2012-11-03 VITALS — BP 158/62 | HR 59 | Wt 173.2 lb

## 2012-11-03 DIAGNOSIS — I509 Heart failure, unspecified: Secondary | ICD-10-CM | POA: Insufficient documentation

## 2012-11-03 DIAGNOSIS — R0609 Other forms of dyspnea: Secondary | ICD-10-CM | POA: Insufficient documentation

## 2012-11-03 DIAGNOSIS — G4730 Sleep apnea, unspecified: Secondary | ICD-10-CM | POA: Insufficient documentation

## 2012-11-03 DIAGNOSIS — R0600 Dyspnea, unspecified: Secondary | ICD-10-CM

## 2012-11-03 DIAGNOSIS — J449 Chronic obstructive pulmonary disease, unspecified: Secondary | ICD-10-CM

## 2012-11-03 DIAGNOSIS — I279 Pulmonary heart disease, unspecified: Secondary | ICD-10-CM | POA: Insufficient documentation

## 2012-11-03 DIAGNOSIS — R0989 Other specified symptoms and signs involving the circulatory and respiratory systems: Secondary | ICD-10-CM | POA: Insufficient documentation

## 2012-11-03 DIAGNOSIS — N184 Chronic kidney disease, stage 4 (severe): Secondary | ICD-10-CM | POA: Insufficient documentation

## 2012-11-03 DIAGNOSIS — G4733 Obstructive sleep apnea (adult) (pediatric): Secondary | ICD-10-CM

## 2012-11-03 DIAGNOSIS — J4489 Other specified chronic obstructive pulmonary disease: Secondary | ICD-10-CM | POA: Insufficient documentation

## 2012-11-03 LAB — BASIC METABOLIC PANEL
BUN: 43 mg/dL — ABNORMAL HIGH (ref 6–23)
Chloride: 102 mEq/L (ref 96–112)
Creatinine, Ser: 2.58 mg/dL — ABNORMAL HIGH (ref 0.50–1.10)
GFR calc non Af Amer: 17 mL/min — ABNORMAL LOW (ref >90)
Glucose, Bld: 98 mg/dL (ref 70–99)
Potassium: 3.5 mEq/L (ref 3.5–5.1)

## 2012-11-03 LAB — PRO B NATRIURETIC PEPTIDE: Pro B Natriuretic peptide (BNP): 1855 pg/mL — ABNORMAL HIGH (ref 0–125)

## 2012-11-03 MED ORDER — POTASSIUM CHLORIDE ER 10 MEQ PO TBCR: 10.0000 meq | EXTENDED_RELEASE_TABLET | Freq: Two times a day (BID) | ORAL | Status: DC

## 2012-11-03 NOTE — Telephone Encounter (Signed)
Called pt about labs. K+ 3.5 a little on the lower side. Will start potassium chloride 10 meq daily. Will send prescription in today to [facility] and asked her to start taking. Patient states she understands.

## 2012-11-03 NOTE — Progress Notes (Signed)
Patient ID: [name], female   DOB: 07-28-1940, 72 y.o.   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 More recent cardiac history includes asystolic cardiac arrest 02/2012 in the setting of a/c respiratory failure, then NSTEMI/NSVT 06/2012 (felt type II, cath deferred due to CKD, started on Plavix).  [facility] 09/13/11: mild pulmonary hypertension, otherwise normal right heart pressures  2D echo 06/2012: EF 45%, severe concentric LVH, hypokinesis of inferolateral, inferior, and inferoseptal myocardium, grade 2 diastolic dysfunction, mild AS, moderately dilated LA/RA, mild-mod reduced RV systolic function, PA pressure , trivial pericardial effusion was identified posterior to the heart  Recently admitted to the hospital 10/06/2012 with complaints of SOB. This was her 10th admission over the last 1 year. She was diuresed with IV lasix and had a net negative of -15 liters and a discharge weight of 172 lbs. She has multiple issues with transportation to clinic appointments and is frequently a no show. Nephrololgy was consulted and did not feel candidate for RRT and family rejected SNF placement.   Post hospital visit: Feeling pretty good. Lives at home with daughter. Daughter works and is with her until 2:00pm. Denies SOB, orthopnea, CP, or edema. Does not get around much. She reports she goes back in forth to the bathroom and kitchen which she gets SOB. Does not leave house much d/t SOB. Weighs everyday, weight at home 169-172 lbs. Does not do sliding scale diuretics. Nurse comes to fill pill bottles twice a week. Not taken medications yet. Checks BP at home, but cant remember.   SH: Lives with daughter who is on dialysis  ROS: All systems negative except as  listed in HPI, PMH and Problem List.  Past Medical History  Diagnosis Date  . COPD (chronic obstructive pulmonary disease)     on home o2  . CVA (cerebral infarction)     hx  . Hypertensive heart disease with congestive heart failure   . Chronic cor pulmonale   . Chronic kidney disease (CKD), stage IV (severe)   . Hyperlipdemia   . Obesity (BMI 30-39.9)   . OSA (obstructive sleep apnea)     Does not use CPAP  . History of gout 09/07/2011  . Chronic combined systolic and diastolic CHF (congestive heart failure) 7/13    a. With cor pulmonale - EF 45-50%.  . Anemia   . Vascular disease   . Asthma   . Hypertension   . Diabetes mellitus   . GERD (gastroesophageal reflux disease)   . Arthritis   . RBBB   . Thrombocytopenia   . Acute and chronic respiratory failure     a. Trache placed 11/2011 for recurrent intubation (hypercarbic resp failure), post-intubation vocal cord edema, several issues since that time wih trache. b. On home O2 with trache.  . Obesity, unspecified 08/28/2012  . Obesity hypoventilation syndrome   . Lung collapse     a. Chronic LLL collapse.  . Cardiac arrest     a. Asystolic cardiac arrest 02/2012 in setting of a/c respiratory failure (ischemic w/u has been deferred due to worsening CKD).  . NSTEMI (non-ST elevated myocardial infarction)     a. 06/2012 felt type II - troponin peak 1.45 - ischemic eval has been deferred  due to CKD. Started on Plavix.  . Pulmonary HTN     a. mild pulm HTN by [facility] 08/2011, felt due to lung disease. b. PA pressure by echo 06/2012.  Marland Kitchen NSVT (nonsustained ventricular tachycardia)     a. Noted on tele, admission in 06/2012.  Marland Kitchen Cirrhosis     a. Suspected on abd CT 12/2011.    Current Outpatient Prescriptions  Medication Sig Dispense Refill  . albuterol (PROVENTIL) (5 MG/ML) 0.5% nebulizer solution Take 0.5 mLs (2.5 mg total) by nebulization 4 (four) times daily.  20 mL  12  . amLODipine (NORVASC) 10 MG tablet Take 10 mg by mouth  daily.      Marland Kitchen arformoterol (BROVANA) 15 MCG/2ML NEBU Take 2 mLs (15 mcg total) by nebulization 2 (two) times daily.  120 mL  1  . Artificial Saliva (BIOTENE MOISTURIZING MOUTH) SOLN Use as directed 1 spray in the mouth or throat daily.      . Ascorbic Acid (VITAMIN C PO) Take 1 tablet by mouth daily.      Marland Kitchen aspirin 81 MG chewable tablet Chew 1 tablet (81 mg total) by mouth daily.      Marland Kitchen atorvastatin (LIPITOR) 40 MG tablet Take 1 tablet (40 mg total) by mouth daily.  30 tablet  1  . budesonide (PULMICORT) 0.25 MG/2ML nebulizer solution Take 2 mLs (0.25 mg total) by nebulization 2 (two) times daily.  60 mL  12  . busPIRone (BUSPAR) 5 MG tablet Take 5 mg by mouth 2 (two) times daily.      . carvedilol (COREG) 6.25 MG tablet Take 3 tablets (18.75 mg total) by mouth 2 (two) times daily with a meal.      . cloNIDine (CATAPRES) 0.2 MG tablet Take 0.5 tablets (0.1 mg total) by mouth 2 (two) times daily.  60 tablet  11  . clopidogrel (PLAVIX) 75 MG tablet Take 1 tablet (75 mg total) by mouth daily with breakfast.  30 tablet  11  . dextromethorphan-guaiFENesin (MUCINEX DM) 30-600 MG per 12 hr tablet Take 1 tablet by mouth 2 (two) times daily.  60 tablet  0  . ferrous sulfate 325 (65 FE) MG EC tablet Take 1 tablet (325 mg total) by mouth 2 (two) times daily.  60 tablet  3  . hydrALAZINE (APRESOLINE) 100 MG tablet Take 100 mg by mouth 3 (three) times daily.      Marland Kitchen ipratropium (ATROVENT) 0.02 % nebulizer solution Take 2.5 mLs (0.5 mg total) by nebulization 4 (four) times daily.  75 mL  12  . isosorbide mononitrate (IMDUR) 30 MG 24 hr tablet Take 1 tablet (30 mg total) by mouth daily.  30 tablet  1  . LORazepam (ATIVAN) 0.5 MG tablet Take 1 tablet (0.5 mg total) by mouth every 6 (six) hours as needed for anxiety.  30 tablet  0  . metoCLOPramide (REGLAN) 5 MG tablet Take 5 mg by mouth 4 (four) times daily.      Marland Kitchen oxycodone (OXY-IR) 5 MG capsule Take 5 mg by mouth every 6 (six) hours as needed (for moderate pain).       . pantoprazole (PROTONIX) 20 MG tablet Take 40 mg by mouth daily.      . sertraline (ZOLOFT) 100 MG tablet Take 100 mg by mouth at bedtime.      . torsemide (DEMADEX) 20 MG tablet Take 2 tablets (40 mg total) by mouth daily.       No current facility-administered medications for this encounter.  Filed Vitals:   11/03/12 0906  BP: 158/62  Pulse: 59  Weight: 173 lb 4 oz (78.586 kg)  SpO2: 100%   PHYSICAL EXAM: General:  Chronically ill appearing. No resp difficulty; in wheelchair with [facility] representative HEENT: normal; trach Neck: supple. JVP 8-9. Carotids 2+ bilaterally; no bruits. No lymphadenopathy or thryomegaly appreciated. Cor: PMI normal. Regular rate & rhythm. No rubs, gallops or murmurs. Lungs: diminshed in the bases bilaterally Abdomen: soft, nontender, nondistended. No hepatosplenomegaly. No bruits or masses. Good bowel sounds. Extremities: no cyanosis, clubbing, rash, 1 + edema Neuro: alert & orientedx3, cranial nerves grossly intact. Moves all 4 extremities w/o difficulty. Affect pleasant.   ASSESSMENT & PLAN:  72 yo with chronic respiratory failure/trach in setting of COPD and OHS/OSA also with chronic LLL collapse, CKD IV, and CHF presented with increased dyspnea and was thought to be volume overloaded. She has been admitted 10+ times in the last year. She has never had outpatient cardiology followup.   1. CHF: Chronic systolic HF,  EF 16% with moderate pulmonary HTN and RV failure on last echo (06/2012) - NYHA IIIb symptoms. Volume status difficult to assess, but appears mildly elevated. Will increase torsemide to 60 mg for 2 days and then back to 40 mg daily. Her home dose before last admission was 40 mg daily so she may need increased dose at home to 60 mg daily. - Continue Coreg, hydral/Imdur. No ACEI or Cleda Daub with significant CKD.  - pro-BNP today - Very lengthy discussion with patient about the role of the HF clinic and our goal to keep her out of the hospital  and manage her HF. Explained to the patient and wrote out specific details for daughter about the need to weigh daily and record the weight. SBP elevated this am, however she has not taken medications yet today, so I have asked her to take all medications before next visit to reassess (can hold torsemide before visit). Provided the patient with detailed instructions on sliding scale diuretics and discussed the improtance of calling hte clinic if her weight is increasing or her symptoms are worsening. Re-educated the need to sodium restriction to less than 2 gm a day and restricting fluids to less than 2L a day. Have asked the patient to please bring daughter to next visit and stressed the importance of follow up and taking medications as prescribed.  2. Chronic respiratory failure: COPD, OHS/OSA, chronic LLL collapse. Needs ongoing pulmonary followup.  - Has appt with Pulmonology next week and have provided the information to [facility] to assist in transportation  3. CKD stage IV: Will get BMET today.   4. HTN - SBP 150s. Will not titrate any medications d/t patient has not taken any meds today. As above asked her to take all medications before next visit in order to reassess ( may hold torsemide).  Difficult situation d/t the fact the patient has not had any outpatient follow up with cardiology. Will set patient up for paramedicine program and have asked on patient instructions for the daughter to accompany her mother at the next visit. In order to keep the patient out of the hospital it is going to take frequent follow up, along with the help of paramedicine and HH.   Ulla Potash B 10:10 PM

## 2012-11-03 NOTE — Patient Instructions (Addendum)
Fluid status slightly up will increase torsemide to 60 mg (3 tablets) for 2 days and then go back to 40 mg (2 tablets) a day.  Very important you weigh yourself everyday and write it down. Please bring weight log with you to your next appointment. Please write your blood pressures down as well and bring them.  Take medications before next visit. If you want to hold your fluid pill that is ok, but take all other medications.  Will enroll you in the Paramedicine Program.  Follow up with pulmonology.  If your daughter has any questions have her call us [phone], would love if she could come to next visit.  If weight goes up more than 3 lbs in a day or 5 lbs in a week take an extra torsemide pill. If weight doesn't come back down call the HF clinic, number above.  Our goal is to keep you at home and comfortable and not in the hospital so if you start feeling worse call us and we can usually fit you in an appointment that day.  Follow up is very important, please call us if having trouble with medications if you are going to run out or can't make your appointment.  Will call with lab results  F/U 1-2 weeks.  Do the following things EVERYDAY: 1) Weigh yourself in the morning before breakfast. Write it down and keep it in a log. 2) Take your medicines as prescribed 3) Eat low salt foods-Limit salt (sodium) to 2000 mg per day.  4) Stay as active as you can everyday 5) Limit all fluids for the day to less than 2 liters 6)

## 2012-11-08 ENCOUNTER — Institutional Professional Consult (permissible substitution): Payer: Self-pay | Admitting: Pulmonary Disease

## 2012-11-14 ENCOUNTER — Ambulatory Visit (HOSPITAL_COMMUNITY)
Admission: RE | Admit: 2012-11-14 | Discharge: 2012-11-14 | Disposition: A | Discharge status: Home / Self Care | Payer: Medicare Other | Source: Ambulatory Visit | Attending: Internal Medicine | Admitting: Internal Medicine

## 2012-11-14 VITALS — BP 142/64 | HR 57 | Wt 177.0 lb

## 2012-11-14 DIAGNOSIS — I5032 Chronic diastolic (congestive) heart failure: Secondary | ICD-10-CM

## 2012-11-14 DIAGNOSIS — N184 Chronic kidney disease, stage 4 (severe): Secondary | ICD-10-CM

## 2012-11-14 DIAGNOSIS — I2789 Other specified pulmonary heart diseases: Secondary | ICD-10-CM | POA: Insufficient documentation

## 2012-11-14 MED ORDER — TORSEMIDE 20 MG PO TABS: 60.0000 mg | ORAL_TABLET | Freq: Every day | ORAL | Status: DC

## 2012-11-14 NOTE — Patient Instructions (Addendum)
Increase torsemide to 60 mg daily (3 tablets in the morning).  Will place order for stockings.  Labs next week  Follow up in 3 weeks.  Call any issues.  Do the following things EVERYDAY: 1) Weigh yourself in the morning before breakfast. Write it down and keep it in a log. 2) Take your medicines as prescribed 3) Eat low salt foods-Limit salt (sodium) to 2000 mg per day.  4) Stay as active as you can everyday 5) Limit all fluids for the day to less than 2 liters 6)

## 2012-11-14 NOTE — Progress Notes (Signed)
Patient ID: [name], female   DOB: 1940-05-22, 72 y.o.   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 More recent cardiac history includes asystolic cardiac arrest 02/2012 in the setting of a/c respiratory failure, then NSTEMI/NSVT 06/2012 (felt type II, cath deferred due to CKD, started on Plavix).  RHC 09/13/11: mild pulmonary hypertension mean PA 28 with PVR 3, otherwise normal right heart pressures  2D echo 06/2012: EF 45%, severe concentric LVH, hypokinesis of inferolateral, inferior, and inferoseptal myocardium, grade 2 diastolic dysfunction, mild AS, moderately dilated LA/RA, mild-mod reduced RV systolic function, PA pressure , trivial pericardial effusion was identified posterior to the heart  Recently admitted to the hospital 10/06/2012 with complaints of SOB. This was her 10th admission over the last 1 year. She was diuresed with IV lasix and had a net negative of -15 liters and a discharge weight of 172 lbs. She has multiple issues with transportation to clinic appointments and is frequently a no show. Nephrololgy was consulted and did not feel candidate for RRT and family rejected SNF placement.   Post hospital visit: Last visit increased torsemide to 60 mg daily for 2 days and then back to 40 mg daily (however has been doing 20 mg in am and 30 mg in afternoon mg). Feeling pretty good. + SOB.  Denies orthopnea, CP, or edema. Uses a cane to get around the house. Does not leave house much d/t SOB. Weighs everyday, weight at home 171- 176 lbs. SBP 140-160.  Does not do sliding scale diuretics. Nurse comes to fill pill bottles twice a week. [facility] and [facility] following.   SH: Lives with daughter who is on dialysis  ROS: All systems negative  except as listed in HPI, PMH and Problem List.  Past Medical History  Diagnosis Date  . COPD (chronic obstructive pulmonary disease)     on home o2  . CVA (cerebral infarction)     hx  . Hypertensive heart disease with congestive heart failure   . Chronic cor pulmonale   . Chronic kidney disease (CKD), stage IV (severe)   . Hyperlipdemia   . Obesity (BMI 30-39.9)   . OSA (obstructive sleep apnea)     Does not use CPAP  . History of gout 09/07/2011  . Chronic combined systolic and diastolic CHF (congestive heart failure) 7/13    a. With cor pulmonale - EF 45-50%.  . Anemia   . Vascular disease   . Asthma   . Hypertension   . Diabetes mellitus   . GERD (gastroesophageal reflux disease)   . Arthritis   . RBBB   . Thrombocytopenia   . Acute and chronic respiratory failure     a. Trache placed 11/2011 for recurrent intubation (hypercarbic resp failure), post-intubation vocal cord edema, several issues since that time wih trache. b. On home O2 with trache.  . Obesity, unspecified 08/28/2012  . Obesity hypoventilation syndrome   . Lung collapse     a. Chronic LLL collapse.  . Cardiac arrest     a. Asystolic cardiac arrest 02/2012 in setting of a/c respiratory failure (ischemic w/u has been deferred due to worsening CKD).  . NSTEMI (non-ST elevated myocardial infarction)     a. 06/2012 felt  type II - troponin peak 1.45 - ischemic eval has been deferred due to CKD. Started on Plavix.  . Pulmonary HTN     a. mild pulm HTN by RHC 08/2011, felt due to lung disease. b. PA pressure by echo 06/2012.  Marland Kitchen NSVT (nonsustained ventricular tachycardia)     a. Noted on tele, admission in 06/2012.  Marland Kitchen Cirrhosis     a. Suspected on abd CT 12/2011.    Current Outpatient Prescriptions  Medication Sig Dispense Refill  . albuterol (PROVENTIL) (5 MG/ML) 0.5% nebulizer solution Take 0.5 mLs (2.5 mg total) by nebulization 4 (four) times daily.  20 mL  12  . amLODipine (NORVASC) 10 MG tablet Take 10 mg  by mouth daily.      Marland Kitchen arformoterol (BROVANA) 15 MCG/2ML NEBU Take 2 mLs (15 mcg total) by nebulization 2 (two) times daily.  120 mL  1  . Artificial Saliva (BIOTENE MOISTURIZING MOUTH) SOLN Use as directed 1 spray in the mouth or throat daily.      . Ascorbic Acid (VITAMIN C PO) Take 1 tablet by mouth daily.      Marland Kitchen aspirin 81 MG chewable tablet Chew 1 tablet (81 mg total) by mouth daily.      Marland Kitchen atorvastatin (LIPITOR) 40 MG tablet Take 1 tablet (40 mg total) by mouth daily.  30 tablet  1  . budesonide (PULMICORT) 0.25 MG/2ML nebulizer solution Take 2 mLs (0.25 mg total) by nebulization 2 (two) times daily.  60 mL  12  . busPIRone (BUSPAR) 5 MG tablet Take 5 mg by mouth 2 (two) times daily.      . carvedilol (COREG) 6.25 MG tablet Take 3 tablets (18.75 mg total) by mouth 2 (two) times daily with a meal.      . cloNIDine (CATAPRES) 0.2 MG tablet Take 0.5 tablets (0.1 mg total) by mouth 2 (two) times daily.  60 tablet  11  . clopidogrel (PLAVIX) 75 MG tablet Take 1 tablet (75 mg total) by mouth daily with breakfast.  30 tablet  11  . dextromethorphan-guaiFENesin (MUCINEX DM) 30-600 MG per 12 hr tablet Take 1 tablet by mouth 2 (two) times daily.  60 tablet  0  . ferrous sulfate 325 (65 FE) MG EC tablet Take 1 tablet (325 mg total) by mouth 2 (two) times daily.  60 tablet  3  . hydrALAZINE (APRESOLINE) 100 MG tablet Take 100 mg by mouth 3 (three) times daily.      Marland Kitchen ipratropium (ATROVENT) 0.02 % nebulizer solution Take 2.5 mLs (0.5 mg total) by nebulization 4 (four) times daily.  75 mL  12  . isosorbide mononitrate (IMDUR) 30 MG 24 hr tablet Take 1 tablet (30 mg total) by mouth daily.  30 tablet  1  . LORazepam (ATIVAN) 0.5 MG tablet Take 1 tablet (0.5 mg total) by mouth every 6 (six) hours as needed for anxiety.  30 tablet  0  . metoCLOPramide (REGLAN) 5 MG tablet Take 5 mg by mouth 4 (four) times daily.      Marland Kitchen oxycodone (OXY-IR) 5 MG capsule Take 5 mg by mouth every 6 (six) hours as needed (for  moderate pain).      . pantoprazole (PROTONIX) 20 MG tablet Take 40 mg by mouth daily.      . potassium chloride (K-DUR) 10 MEQ tablet Take 1 tablet (10 mEq total) by mouth 2 (two) times daily.  30 tablet  6  . sertraline (ZOLOFT) 100 MG tablet Take 100 mg  by mouth at bedtime.      . torsemide (DEMADEX) 20 MG tablet Take 3 tablets (60 mg total) by mouth daily. Please file dose change patient has medication  90 tablet  6   No current facility-administered medications for this encounter.   Filed Vitals:   11/14/12 0953  BP: 142/64  Pulse: 57  Weight: 177 lb (80.287 kg)  SpO2: 100%   PHYSICAL EXAM: General:  Chronically ill appearing. No resp difficulty; in wheelchair with [facility] representative HEENT: normal; trach Neck: supple. JVP to ear with prominent CV waves. Carotids 2+ bilaterally; no bruits. No lymphadenopathy or thryomegaly appreciated. Cor: PMI normal. Regular rate & rhythm. No rubs, gallops or murmurs. Lungs: diminshed in the bases bilaterally, O2 Abdomen: soft, nontender, nondistended. No hepatosplenomegaly. No bruits or masses. Good bowel sounds. Extremities: no cyanosis, clubbing, rash, 1 + edema Neuro: alert & orientedx3, cranial nerves grossly intact. Moves all 4 extremities w/o difficulty. Affect pleasant.   ASSESSMENT & PLAN:  1. CHF: Chronic systolic HF,  EF 40% with moderate pulmonary HTN and RV failure on last echo (06/2012) - NYHA IIIb symptoms. Volume status moderately elevated. Will increase torsemide to 60 mg daily, may need weekly metolazone. - Continue Coreg, hydral/Imdur. No ACEI or Cleda Daub with significant CKD.  - Reinforced the need and importance of daily weights, a low sodium diet, and fluid restriction (less than 2 L a day). Instructed to call the HF clinic if weight increases more than 3 lbs overnight or 5 lbs in a week. We also discussed the use of sliding scale diuretics. Goal weight 164-168 pounds - Place TED stockings - Given difference between previous  RHC in 2013 and recent echo may be worth repeating RHC if not improving  2. Chronic respiratory failure: COPD, OHS/OSA, chronic LLL collapse. Needs ongoing pulmonary followup.  - Has appt with Pulmonology for sleep study 10/20. [facility] to assist in transportation. Continue home O2 2 liters  3. CKD stage IV: Will get BMET next week with increased diuretics.   4. HTN - SBP 140s. Increase torsemide today to 60 mg daily. Will likely need to continue to titrate medications at next visit.   Continues to be difficult situation since patient has transportation issues. Appreciate [facility] support in helping set up transportation and hope this will continue. Will touch base with paramedicine program to see when they are going to enroll patient.    F/U 2-3 weeks  Truman Hayward 12:45 PM

## 2012-11-20 ENCOUNTER — Encounter: Payer: Self-pay | Admitting: Internal Medicine

## 2012-11-27 ENCOUNTER — Telehealth (HOSPITAL_COMMUNITY): Payer: Self-pay | Admitting: Anesthesiology

## 2012-11-27 NOTE — Telephone Encounter (Signed)
Phone call from [facility] that [name]'s weight is up 4 lbs and she is more SOB. Will have her take an extra 20 gm Torsemide for 2 days along with an extra 10 meq of potassium. [facility] nurse will return to her house later this week to check on her.   Ulla Potash B 12:34 PM

## 2012-12-04 ENCOUNTER — Encounter (HOSPITAL_COMMUNITY): Payer: Self-pay

## 2012-12-11 ENCOUNTER — Institutional Professional Consult (permissible substitution): Payer: Self-pay | Admitting: Pulmonary Disease

## 2012-12-15 ENCOUNTER — Encounter: Payer: Self-pay | Admitting: Internal Medicine

## 2012-12-17 ENCOUNTER — Inpatient Hospital Stay (HOSPITAL_COMMUNITY): Payer: Medicare Other

## 2012-12-17 ENCOUNTER — Encounter (HOSPITAL_COMMUNITY): Payer: Self-pay | Admitting: Emergency Medicine

## 2012-12-17 ENCOUNTER — Inpatient Hospital Stay (HOSPITAL_COMMUNITY)
Admission: EM | Admit: 2012-12-17 | Discharge: 2012-12-20 | DRG: 189 | Disposition: A | Discharge status: Home / Self Care | Payer: Medicare Other | Attending: Internal Medicine | Admitting: Internal Medicine

## 2012-12-17 ENCOUNTER — Emergency Department (HOSPITAL_COMMUNITY): Payer: Medicare Other

## 2012-12-17 DIAGNOSIS — I2510 Atherosclerotic heart disease of native coronary artery without angina pectoris: Secondary | ICD-10-CM

## 2012-12-17 DIAGNOSIS — I509 Heart failure, unspecified: Secondary | ICD-10-CM | POA: Diagnosis present

## 2012-12-17 DIAGNOSIS — I798 Other disorders of arteries, arterioles and capillaries in diseases classified elsewhere: Secondary | ICD-10-CM | POA: Diagnosis present

## 2012-12-17 DIAGNOSIS — N179 Acute kidney failure, unspecified: Secondary | ICD-10-CM | POA: Diagnosis present

## 2012-12-17 DIAGNOSIS — R0600 Dyspnea, unspecified: Secondary | ICD-10-CM

## 2012-12-17 DIAGNOSIS — F3289 Other specified depressive episodes: Secondary | ICD-10-CM | POA: Diagnosis present

## 2012-12-17 DIAGNOSIS — Z79899 Other long term (current) drug therapy: Secondary | ICD-10-CM

## 2012-12-17 DIAGNOSIS — J4489 Other specified chronic obstructive pulmonary disease: Secondary | ICD-10-CM | POA: Diagnosis present

## 2012-12-17 DIAGNOSIS — Z23 Encounter for immunization: Secondary | ICD-10-CM

## 2012-12-17 DIAGNOSIS — Z8673 Personal history of transient ischemic attack (TIA), and cerebral infarction without residual deficits: Secondary | ICD-10-CM

## 2012-12-17 DIAGNOSIS — I2789 Other specified pulmonary heart diseases: Secondary | ICD-10-CM

## 2012-12-17 DIAGNOSIS — E669 Obesity, unspecified: Secondary | ICD-10-CM

## 2012-12-17 DIAGNOSIS — Z8249 Family history of ischemic heart disease and other diseases of the circulatory system: Secondary | ICD-10-CM

## 2012-12-17 DIAGNOSIS — I214 Non-ST elevation (NSTEMI) myocardial infarction: Secondary | ICD-10-CM

## 2012-12-17 DIAGNOSIS — E1159 Type 2 diabetes mellitus with other circulatory complications: Secondary | ICD-10-CM | POA: Diagnosis present

## 2012-12-17 DIAGNOSIS — J9620 Acute and chronic respiratory failure, unspecified whether with hypoxia or hypercapnia: Principal | ICD-10-CM | POA: Diagnosis present

## 2012-12-17 DIAGNOSIS — K219 Gastro-esophageal reflux disease without esophagitis: Secondary | ICD-10-CM | POA: Diagnosis present

## 2012-12-17 DIAGNOSIS — F17200 Nicotine dependence, unspecified, uncomplicated: Secondary | ICD-10-CM

## 2012-12-17 DIAGNOSIS — Z823 Family history of stroke: Secondary | ICD-10-CM

## 2012-12-17 DIAGNOSIS — I110 Hypertensive heart disease with heart failure: Secondary | ICD-10-CM

## 2012-12-17 DIAGNOSIS — R531 Weakness: Secondary | ICD-10-CM

## 2012-12-17 DIAGNOSIS — E119 Type 2 diabetes mellitus without complications: Secondary | ICD-10-CM

## 2012-12-17 DIAGNOSIS — J441 Chronic obstructive pulmonary disease with (acute) exacerbation: Secondary | ICD-10-CM | POA: Diagnosis present

## 2012-12-17 DIAGNOSIS — I279 Pulmonary heart disease, unspecified: Secondary | ICD-10-CM

## 2012-12-17 DIAGNOSIS — I5043 Acute on chronic combined systolic (congestive) and diastolic (congestive) heart failure: Secondary | ICD-10-CM

## 2012-12-17 DIAGNOSIS — I5023 Acute on chronic systolic (congestive) heart failure: Secondary | ICD-10-CM

## 2012-12-17 DIAGNOSIS — Z515 Encounter for palliative care: Secondary | ICD-10-CM

## 2012-12-17 DIAGNOSIS — D649 Anemia, unspecified: Secondary | ICD-10-CM

## 2012-12-17 DIAGNOSIS — K7460 Unspecified cirrhosis of liver: Secondary | ICD-10-CM

## 2012-12-17 DIAGNOSIS — R079 Chest pain, unspecified: Secondary | ICD-10-CM

## 2012-12-17 DIAGNOSIS — M109 Gout, unspecified: Secondary | ICD-10-CM | POA: Diagnosis present

## 2012-12-17 DIAGNOSIS — J479 Bronchiectasis, uncomplicated: Secondary | ICD-10-CM | POA: Diagnosis present

## 2012-12-17 DIAGNOSIS — J9500 Unspecified tracheostomy complication: Secondary | ICD-10-CM

## 2012-12-17 DIAGNOSIS — Z7982 Long term (current) use of aspirin: Secondary | ICD-10-CM

## 2012-12-17 DIAGNOSIS — G9340 Encephalopathy, unspecified: Secondary | ICD-10-CM

## 2012-12-17 DIAGNOSIS — Z8674 Personal history of sudden cardiac arrest: Secondary | ICD-10-CM

## 2012-12-17 DIAGNOSIS — Z87891 Personal history of nicotine dependence: Secondary | ICD-10-CM

## 2012-12-17 DIAGNOSIS — F329 Major depressive disorder, single episode, unspecified: Secondary | ICD-10-CM | POA: Diagnosis present

## 2012-12-17 DIAGNOSIS — J189 Pneumonia, unspecified organism: Secondary | ICD-10-CM

## 2012-12-17 DIAGNOSIS — I4510 Unspecified right bundle-branch block: Secondary | ICD-10-CM | POA: Diagnosis present

## 2012-12-17 DIAGNOSIS — N184 Chronic kidney disease, stage 4 (severe): Secondary | ICD-10-CM | POA: Diagnosis present

## 2012-12-17 DIAGNOSIS — E662 Morbid (severe) obesity with alveolar hypoventilation: Secondary | ICD-10-CM | POA: Diagnosis present

## 2012-12-17 DIAGNOSIS — J449 Chronic obstructive pulmonary disease, unspecified: Secondary | ICD-10-CM

## 2012-12-17 DIAGNOSIS — Z882 Allergy status to sulfonamides status: Secondary | ICD-10-CM

## 2012-12-17 DIAGNOSIS — I5032 Chronic diastolic (congestive) heart failure: Secondary | ICD-10-CM

## 2012-12-17 DIAGNOSIS — J9610 Chronic respiratory failure, unspecified whether with hypoxia or hypercapnia: Secondary | ICD-10-CM

## 2012-12-17 DIAGNOSIS — I130 Hypertensive heart and chronic kidney disease with heart failure and stage 1 through stage 4 chronic kidney disease, or unspecified chronic kidney disease: Secondary | ICD-10-CM | POA: Diagnosis present

## 2012-12-17 DIAGNOSIS — D638 Anemia in other chronic diseases classified elsewhere: Secondary | ICD-10-CM | POA: Diagnosis present

## 2012-12-17 DIAGNOSIS — J45909 Unspecified asthma, uncomplicated: Secondary | ICD-10-CM

## 2012-12-17 DIAGNOSIS — G4733 Obstructive sleep apnea (adult) (pediatric): Secondary | ICD-10-CM

## 2012-12-17 DIAGNOSIS — E785 Hyperlipidemia, unspecified: Secondary | ICD-10-CM | POA: Diagnosis present

## 2012-12-17 DIAGNOSIS — Z930 Tracheostomy status: Secondary | ICD-10-CM

## 2012-12-17 DIAGNOSIS — I252 Old myocardial infarction: Secondary | ICD-10-CM

## 2012-12-17 DIAGNOSIS — I452 Bifascicular block: Secondary | ICD-10-CM

## 2012-12-17 DIAGNOSIS — I5042 Chronic combined systolic (congestive) and diastolic (congestive) heart failure: Secondary | ICD-10-CM | POA: Diagnosis present

## 2012-12-17 DIAGNOSIS — IMO0002 Reserved for concepts with insufficient information to code with codable children: Secondary | ICD-10-CM

## 2012-12-17 LAB — COMPREHENSIVE METABOLIC PANEL
ALT: 6 U/L (ref 0–35)
AST: 12 U/L (ref 0–37)
Albumin: 3.2 g/dL — ABNORMAL LOW (ref 3.5–5.2)
Alkaline Phosphatase: 74 U/L (ref 39–117)
Glucose, Bld: 97 mg/dL (ref 70–99)
Potassium: 3.6 mEq/L (ref 3.5–5.1)
Sodium: 143 mEq/L (ref 135–145)
Total Protein: 6.3 g/dL (ref 6.0–8.3)

## 2012-12-17 LAB — CBC
Hemoglobin: 9.2 g/dL — ABNORMAL LOW (ref 12.0–15.0)
MCHC: 32.2 g/dL (ref 30.0–36.0)
RDW: 16.9 % — ABNORMAL HIGH (ref 11.5–15.5)

## 2012-12-17 LAB — STREP PNEUMONIAE URINARY ANTIGEN: Strep Pneumo Urinary Antigen: NEGATIVE

## 2012-12-17 LAB — PRO B NATRIURETIC PEPTIDE: Pro B Natriuretic peptide (BNP): 1973 pg/mL — ABNORMAL HIGH (ref 0–125)

## 2012-12-17 MED ORDER — OXYCODONE HCL 5 MG PO CAPS: 5.0000 mg | ORAL_CAPSULE | Freq: Four times a day (QID) | ORAL | Status: DC | PRN

## 2012-12-17 MED ORDER — OXYCODONE HCL 5 MG PO TABS
5.0000 mg | ORAL_TABLET | Freq: Four times a day (QID) | ORAL | Status: DC | PRN
  Administered 2012-12-18 – 2012-12-19 (×3): 5 mg via ORAL
  Filled 2012-12-17 (×3): qty 1

## 2012-12-17 MED ORDER — ALBUTEROL SULFATE (5 MG/ML) 0.5% IN NEBU
5.0000 mg | INHALATION_SOLUTION | Freq: Once | RESPIRATORY_TRACT | Status: AC
  Administered 2012-12-17: 5 mg via RESPIRATORY_TRACT

## 2012-12-17 MED ORDER — HYDRALAZINE HCL 50 MG PO TABS
100.0000 mg | ORAL_TABLET | Freq: Three times a day (TID) | ORAL | Status: DC
  Administered 2012-12-17 – 2012-12-20 (×9): 100 mg via ORAL
  Filled 2012-12-17 (×11): qty 2

## 2012-12-17 MED ORDER — BUDESONIDE 0.25 MG/2ML IN SUSP
0.2500 mg | Freq: Two times a day (BID) | RESPIRATORY_TRACT | Status: DC
Start: 2012-12-17 — End: 2012-12-19
  Administered 2012-12-17 – 2012-12-19 (×4): 0.25 mg via RESPIRATORY_TRACT
  Filled 2012-12-17 (×6): qty 2

## 2012-12-17 MED ORDER — ATORVASTATIN CALCIUM 40 MG PO TABS
40.0000 mg | ORAL_TABLET | Freq: Every day | ORAL | Status: DC
  Administered 2012-12-17 – 2012-12-20 (×4): 40 mg via ORAL
  Filled 2012-12-17 (×4): qty 1

## 2012-12-17 MED ORDER — LORAZEPAM 0.5 MG PO TABS: 0.5000 mg | ORAL_TABLET | Freq: Four times a day (QID) | ORAL | Status: DC | PRN

## 2012-12-17 MED ORDER — ALBUTEROL SULFATE (5 MG/ML) 0.5% IN NEBU
INHALATION_SOLUTION | RESPIRATORY_TRACT | Status: AC
  Filled 2012-12-17: qty 1

## 2012-12-17 MED ORDER — HYDRALAZINE HCL 100 MG PO TABS: 100.0000 mg | ORAL_TABLET | Freq: Three times a day (TID) | ORAL | Status: DC

## 2012-12-17 MED ORDER — FERROUS SULFATE 325 (65 FE) MG PO TBEC: 325.0000 mg | DELAYED_RELEASE_TABLET | Freq: Two times a day (BID) | ORAL | Status: DC

## 2012-12-17 MED ORDER — ENOXAPARIN SODIUM 30 MG/0.3ML ~~LOC~~ SOLN
30.0000 mg | SUBCUTANEOUS | Status: DC
  Administered 2012-12-17 – 2012-12-19 (×3): 30 mg via SUBCUTANEOUS
  Filled 2012-12-17 (×4): qty 0.3

## 2012-12-17 MED ORDER — FERROUS SULFATE 325 (65 FE) MG PO TABS
325.0000 mg | ORAL_TABLET | Freq: Two times a day (BID) | ORAL | Status: DC
  Administered 2012-12-17 – 2012-12-20 (×7): 325 mg via ORAL
  Filled 2012-12-17 (×8): qty 1

## 2012-12-17 MED ORDER — BUSPIRONE HCL 5 MG PO TABS
5.0000 mg | ORAL_TABLET | Freq: Two times a day (BID) | ORAL | Status: DC
  Administered 2012-12-17 – 2012-12-20 (×6): 5 mg via ORAL
  Filled 2012-12-17 (×7): qty 1

## 2012-12-17 MED ORDER — CARVEDILOL 6.25 MG PO TABS
18.7500 mg | ORAL_TABLET | Freq: Two times a day (BID) | ORAL | Status: DC
  Administered 2012-12-17: 18.75 mg via ORAL
  Filled 2012-12-17 (×2): qty 1

## 2012-12-17 MED ORDER — IPRATROPIUM BROMIDE 0.02 % IN SOLN
0.5000 mg | Freq: Four times a day (QID) | RESPIRATORY_TRACT | Status: DC
  Administered 2012-12-17 – 2012-12-19 (×6): 0.5 mg via RESPIRATORY_TRACT
  Filled 2012-12-17 (×6): qty 2.5

## 2012-12-17 MED ORDER — ARFORMOTEROL TARTRATE 15 MCG/2ML IN NEBU
15.0000 ug | INHALATION_SOLUTION | Freq: Two times a day (BID) | RESPIRATORY_TRACT | Status: DC
  Administered 2012-12-18 – 2012-12-19 (×3): 15 ug via RESPIRATORY_TRACT
  Filled 2012-12-17 (×6): qty 2

## 2012-12-17 MED ORDER — ISOSORBIDE MONONITRATE ER 30 MG PO TB24
30.0000 mg | ORAL_TABLET | Freq: Every day | ORAL | Status: DC
  Administered 2012-12-17 – 2012-12-20 (×4): 30 mg via ORAL
  Filled 2012-12-17 (×4): qty 1

## 2012-12-17 MED ORDER — ASPIRIN 81 MG PO CHEW
81.0000 mg | CHEWABLE_TABLET | Freq: Every day | ORAL | Status: DC
  Administered 2012-12-18 – 2012-12-20 (×3): 81 mg via ORAL
  Filled 2012-12-17 (×3): qty 1

## 2012-12-17 MED ORDER — METOCLOPRAMIDE HCL 5 MG PO TABS
5.0000 mg | ORAL_TABLET | Freq: Four times a day (QID) | ORAL | Status: DC
  Administered 2012-12-17 – 2012-12-20 (×13): 5 mg via ORAL
  Filled 2012-12-17 (×14): qty 1

## 2012-12-17 MED ORDER — SERTRALINE HCL 100 MG PO TABS
100.0000 mg | ORAL_TABLET | Freq: Every day | ORAL | Status: DC
  Administered 2012-12-17 – 2012-12-19 (×3): 100 mg via ORAL
  Filled 2012-12-17 (×4): qty 1

## 2012-12-17 MED ORDER — VANCOMYCIN HCL IN DEXTROSE 1-5 GM/200ML-% IV SOLN
1000.0000 mg | INTRAVENOUS | Status: DC
  Administered 2012-12-17: 21:00:00 1000 mg via INTRAVENOUS
  Filled 2012-12-17: qty 200

## 2012-12-17 MED ORDER — CLOPIDOGREL BISULFATE 75 MG PO TABS
75.0000 mg | ORAL_TABLET | Freq: Every day | ORAL | Status: DC
  Administered 2012-12-17 – 2012-12-20 (×4): 75 mg via ORAL
  Filled 2012-12-17 (×5): qty 1

## 2012-12-17 MED ORDER — PANTOPRAZOLE SODIUM 40 MG PO TBEC
40.0000 mg | DELAYED_RELEASE_TABLET | Freq: Every day | ORAL | Status: DC
  Administered 2012-12-17 – 2012-12-20 (×4): 40 mg via ORAL
  Filled 2012-12-17 (×4): qty 1

## 2012-12-17 MED ORDER — METHYLPREDNISOLONE SODIUM SUCC 40 MG IJ SOLR
40.0000 mg | Freq: Four times a day (QID) | INTRAMUSCULAR | Status: DC
  Administered 2012-12-17 – 2012-12-19 (×8): 40 mg via INTRAVENOUS
  Filled 2012-12-17 (×13): qty 1

## 2012-12-17 MED ORDER — ALBUTEROL SULFATE (5 MG/ML) 0.5% IN NEBU: 2.5000 mg | INHALATION_SOLUTION | RESPIRATORY_TRACT | Status: DC | PRN

## 2012-12-17 MED ORDER — TORSEMIDE 20 MG PO TABS
60.0000 mg | ORAL_TABLET | Freq: Every day | ORAL | Status: DC
  Administered 2012-12-17 – 2012-12-20 (×4): 60 mg via ORAL
  Filled 2012-12-17 (×4): qty 3

## 2012-12-17 MED ORDER — ALBUTEROL SULFATE (5 MG/ML) 0.5% IN NEBU
2.5000 mg | INHALATION_SOLUTION | Freq: Four times a day (QID) | RESPIRATORY_TRACT | Status: DC
  Administered 2012-12-17 – 2012-12-19 (×6): 2.5 mg via RESPIRATORY_TRACT
  Filled 2012-12-17 (×6): qty 0.5

## 2012-12-17 MED ORDER — DEXTROSE 5 % IV SOLN
1.0000 g | INTRAVENOUS | Status: DC
  Administered 2012-12-17: 23:00:00 1 g via INTRAVENOUS
  Filled 2012-12-17 (×2): qty 1

## 2012-12-17 MED ORDER — ALBUTEROL SULFATE (5 MG/ML) 0.5% IN NEBU: 2.5000 mg | INHALATION_SOLUTION | RESPIRATORY_TRACT | Status: DC

## 2012-12-17 MED ORDER — CLONIDINE HCL 0.1 MG PO TABS
0.1000 mg | ORAL_TABLET | Freq: Two times a day (BID) | ORAL | Status: DC
  Administered 2012-12-17 – 2012-12-20 (×6): 0.1 mg via ORAL
  Filled 2012-12-17 (×7): qty 1

## 2012-12-17 MED ORDER — POTASSIUM CHLORIDE ER 10 MEQ PO TBCR
10.0000 meq | EXTENDED_RELEASE_TABLET | Freq: Two times a day (BID) | ORAL | Status: DC
  Administered 2012-12-17 – 2012-12-20 (×6): 10 meq via ORAL
  Filled 2012-12-17 (×7): qty 1

## 2012-12-17 MED ORDER — AMLODIPINE BESYLATE 10 MG PO TABS
10.0000 mg | ORAL_TABLET | Freq: Every day | ORAL | Status: DC
  Administered 2012-12-17 – 2012-12-20 (×4): 10 mg via ORAL
  Filled 2012-12-17 (×4): qty 1

## 2012-12-17 NOTE — ED Provider Notes (Signed)
CSN: [id number]     Arrival date & time 12/17/12  1332 History   First MD Initiated Contact with Patient 12/17/12 1359     Chief Complaint  Patient presents with  . Chest Pain  . Shortness of Breath    HPI Patient developed shortness of breath recently worsening overtime one day ago and continued today. Clear productive cough. Home nurse placed patient on 6 Liter EMS decreased to 4L [place] 100% oxygen saturation  Past Medical History  Diagnosis Date  . COPD (chronic obstructive pulmonary disease)     on home o2  . CVA (cerebral infarction)     hx  . Hypertensive heart disease with congestive heart failure   . Chronic cor pulmonale   . Chronic kidney disease (CKD), stage IV (severe)   . Hyperlipdemia   . Obesity (BMI 30-39.9)   . OSA (obstructive sleep apnea)     Does not use CPAP  . History of gout 09/07/2011  . Chronic combined systolic and diastolic CHF (congestive heart failure) 7/13    a. With cor pulmonale - EF 45-50%.  . Anemia   . Vascular disease   . Asthma   . Hypertension   . Diabetes mellitus   . GERD (gastroesophageal reflux disease)   . Arthritis   . RBBB   . Thrombocytopenia   . Acute and chronic respiratory failure     a. Trache placed 11/2011 for recurrent intubation (hypercarbic resp failure), post-intubation vocal cord edema, several issues since that time wih trache. b. On home O2 with trache.  . Obesity, unspecified 08/28/2012  . Obesity hypoventilation syndrome   . Lung collapse     a. Chronic LLL collapse.  . Cardiac arrest     a. Asystolic cardiac arrest 02/2012 in setting of a/c respiratory failure (ischemic w/u has been deferred due to worsening CKD).  . NSTEMI (non-ST elevated myocardial infarction)     a. 06/2012 felt type II - troponin peak 1.45 - ischemic eval has been deferred due to CKD. Started on Plavix.  . Pulmonary HTN     a. mild pulm HTN by RHC 08/2011, felt due to lung disease. b. PA pressure by echo 06/2012.  Marland Kitchen NSVT (nonsustained  ventricular tachycardia)     a. Noted on tele, admission in 06/2012.  Marland Kitchen Cirrhosis     a. Suspected on abd CT 12/2011.   Past Surgical History  Procedure Laterality Date  . Bilateral oophorectomy    . Cardiac catheterization      right heart cath  . Laparoscopic gastrostomy  12/21/2011    Procedure: LAPAROSCOPIC GASTROSTOMY;  Surgeon: Axel Filler, MD;  Location: [facility] OR;  Service: General;  Laterality: N/A;  laparoscopic gastrostomy possible open feeding tube  . Abdominal hysterectomy    . Tracheostomy  11/2011  . Cataracts    . Tracheostomy tube placement  02/18/2012    Procedure: TRACHEOSTOMY;  Surgeon: Serena Colonel, MD;  Location: [facility] OR;  Service: ENT;  Laterality: N/A;  . Tracheostomy revision N/A 09/01/2012    Procedure: TRACHEOSTOMY REVISION;  Surgeon: Serena Colonel, MD;  Location: [facility] OR;  Service: ENT;  Laterality: N/A;   Family History  Problem Relation Age of Onset  . Heart attack Father   . Stroke Mother     CVA  . Coronary artery disease Daughter    History  Substance Use Topics  . Smoking status: Former Smoker -- 1.00 packs/day for 55 years    Types: Cigarettes    Quit date:  08/28/2011  . Smokeless tobacco: Never Used  . Alcohol Use: No   OB History   Grav Para Term Preterm Abortions TAB SAB Ect Mult Living                 Review of Systems  Unable to perform ROS: Acuity of condition    Allergies  Other and Sulfonamide derivatives  Home Medications   Current Outpatient Rx  Name  Route  Sig  Dispense  Refill  . albuterol (PROVENTIL) (5 MG/ML) 0.5% nebulizer solution   Nebulization   Take 0.5 mLs (2.5 mg total) by nebulization 4 (four) times daily.   20 mL   12   . amLODipine (NORVASC) 10 MG tablet   Oral   Take 10 mg by mouth daily.         Marland Kitchen arformoterol (BROVANA) 15 MCG/2ML NEBU   Nebulization   Take 2 mLs (15 mcg total) by nebulization 2 (two) times daily.   120 mL   1   . Artificial Saliva (BIOTENE MOISTURIZING MOUTH) SOLN    Mouth/Throat   Use as directed 1 spray in the mouth or throat daily.         . Ascorbic Acid (VITAMIN C PO)   Oral   Take 1 tablet by mouth daily.         Marland Kitchen aspirin 81 MG chewable tablet   Oral   Chew 1 tablet (81 mg total) by mouth daily.         Marland Kitchen atorvastatin (LIPITOR) 40 MG tablet   Oral   Take 1 tablet (40 mg total) by mouth daily.   30 tablet   1   . budesonide (PULMICORT) 0.25 MG/2ML nebulizer solution   Nebulization   Take 2 mLs (0.25 mg total) by nebulization 2 (two) times daily.   60 mL   12   . busPIRone (BUSPAR) 5 MG tablet   Oral   Take 5 mg by mouth 2 (two) times daily.         . carvedilol (COREG) 6.25 MG tablet   Oral   Take 3 tablets (18.75 mg total) by mouth 2 (two) times daily with a meal.         . cloNIDine (CATAPRES) 0.2 MG tablet   Oral   Take 0.5 tablets (0.1 mg total) by mouth 2 (two) times daily.   60 tablet   11   . clopidogrel (PLAVIX) 75 MG tablet   Oral   Take 1 tablet (75 mg total) by mouth daily with breakfast.   30 tablet   11   . dextromethorphan-guaiFENesin (MUCINEX DM) 30-600 MG per 12 hr tablet   Oral   Take 1 tablet by mouth 2 (two) times daily.   60 tablet   0   . ferrous sulfate 325 (65 FE) MG EC tablet   Oral   Take 1 tablet (325 mg total) by mouth 2 (two) times daily.   60 tablet   3   . hydrALAZINE (APRESOLINE) 100 MG tablet   Oral   Take 100 mg by mouth 3 (three) times daily.         Marland Kitchen ipratropium (ATROVENT) 0.02 % nebulizer solution   Nebulization   Take 2.5 mLs (0.5 mg total) by nebulization 4 (four) times daily.   75 mL   12   . isosorbide mononitrate (IMDUR) 30 MG 24 hr tablet   Oral   Take 1 tablet (30 mg total) by mouth daily.  30 tablet   1   . LORazepam (ATIVAN) 0.5 MG tablet   Oral   Take 1 tablet (0.5 mg total) by mouth every 6 (six) hours as needed for anxiety.   30 tablet   0   . metoCLOPramide (REGLAN) 5 MG tablet   Oral   Take 5 mg by mouth 4 (four) times daily.          Marland Kitchen oxycodone (OXY-IR) 5 MG capsule   Oral   Take 5 mg by mouth every 6 (six) hours as needed (for moderate pain).         . pantoprazole (PROTONIX) 20 MG tablet   Oral   Take 40 mg by mouth daily.         . potassium chloride (K-DUR) 10 MEQ tablet   Oral   Take 1 tablet (10 mEq total) by mouth 2 (two) times daily.   30 tablet   6   . sertraline (ZOLOFT) 100 MG tablet   Oral   Take 100 mg by mouth at bedtime.         . torsemide (DEMADEX) 20 MG tablet   Oral   Take 3 tablets (60 mg total) by mouth daily. Please file dose change patient has medication   90 tablet   6    BP 141/64  Pulse 66  Temp(Src) 99.4 F (37.4 C) (Oral)  Resp 16  SpO2 100% Physical Exam  Nursing note and vitals reviewed. Constitutional: She is oriented to person, place, and time. She appears well-developed and well-nourished. No distress.  HENT:  Head: Normocephalic and atraumatic.  Eyes: Pupils are equal, round, and reactive to light.  Neck: Normal range of motion.    Cardiovascular: Normal rate and intact distal pulses.   Pulmonary/Chest: Accessory muscle usage present. She is in respiratory distress. She has wheezes.  Abdominal: Normal appearance. She exhibits no distension. There is no tenderness.  Musculoskeletal: Normal range of motion.  Neurological: She is alert and oriented to person, place, and time. No cranial nerve deficit.  Skin: Skin is warm and dry. No rash noted.  Psychiatric: She has a normal mood and affect. Her behavior is normal.    ED Course  Procedures (including critical care time)  Medications  albuterol (PROVENTIL) (5 MG/ML) 0.5% nebulizer solution (not administered)  albuterol (PROVENTIL) (5 MG/ML) 0.5% nebulizer solution 5 mg (5 mg Nebulization Given 12/17/12 1450)    Labs Review Labs Reviewed  CBC - Abnormal; Notable for the following:    RBC 3.52 (*)    Hemoglobin 9.2 (*)    HCT 28.6 (*)    RDW 16.9 (*)    All other components within normal  limits  COMPREHENSIVE METABOLIC PANEL - Abnormal; Notable for the following:    BUN 41 (*)    Creatinine, Ser 2.89 (*)    Albumin 3.2 (*)    GFR calc non Af Amer 15 (*)    GFR calc Af Amer 18 (*)    All other components within normal limits  POCT I-STAT TROPONIN I   Imaging Review Dg Chest Portable 1 View  12/17/2012   CLINICAL DATA:  Chest pain and shortness of breath  EXAM: PORTABLE CHEST - 1 VIEW  COMPARISON:  October 09, 2012  FINDINGS: There is persistent consolidation in the left lower lobe. There is atelectatic change in the left mid lung. The right lung is clear. Heart is borderline prominent with normal pulmonary vascularity.  Tracheostomy is well seated. No pneumothorax. No adenopathy.  No bone lesions.  IMPRESSION: Persistent consolidation in the left lower lobe. Given the chronicity of this finding, it may be prudent to correlate with chest CT or bronchoscopy to further assess. No new opacity is seen. Heart is prominent but stable.   Electronically Signed   By: Bretta Bang M.D.   On: 12/17/2012 15:27    EKG Interpretation     Ventricular Rate:  61 PR Interval:  192 QRS Duration: 164 QT Interval:  484 QTC Calculation: 487 R Axis:   -43 Text Interpretation:  Normal sinus rhythm Left axis deviation Right bundle branch block Left ventricular hypertrophy with QRS widening and repolarization abnormality Abnormal ECG No significant change since last tracing           CRITICAL CARE Performed by: Nelva Nay L Total critical care time: 30 min Critical care time was exclusive of separately billable procedures and treating other patients. Critical care was necessary to treat or prevent imminent or life-threatening deterioration. Critical care was time spent personally by me on the following activities: development of treatment plan with patient and/or surrogate as well as nursing, discussions with consultants, evaluation of patient's response to treatment, examination of  patient, obtaining history from patient or surrogate, ordering and performing treatments and interventions, ordering and review of laboratory studies, ordering and review of radiographic studies, pulse oximetry and re-evaluation of patient's condition.   MDM   1. Chronic diastolic heart failure   2. Dyspnea   3. Chest pain        Nelia Shi, MD 12/17/12 [phone]

## 2012-12-17 NOTE — Progress Notes (Signed)
ANTIBIOTIC CONSULT NOTE - INITIAL  Pharmacy Consult for Vancomycin/Cefepime Indication: HCAP + COPD exac  Allergies  Allergen Reactions  . Other Itching    "Wool"  . Sulfonamide Derivatives Hives and Rash    Patient Measurements: Height: 5\' 4"  (162.6 cm) Weight: 177 lb 0.5 oz (80.3 kg) IBW/kg (Calculated) : 54.7 Adjusted Body Weight: 65 kg  Vital Signs: Temp: 98.5 F (36.9 C) (10/26 1723) Temp src: Oral (10/26 1723) BP: 162/65 mmHg (10/26 1723) Pulse Rate: 72 (10/26 1723) Intake/Output from previous day:   Intake/Output from this shift:    Labs:  Recent Labs  12/17/12 1403  WBC 5.9  HGB 9.2*  PLT 152  CREATININE 2.89*   Estimated Creatinine Clearance: 18 ml/min (by C-G formula based on Cr of 2.89). No results found for this basename: VANCOTROUGH, VANCOPEAK, VANCORANDOM, GENTTROUGH, GENTPEAK, GENTRANDOM, TOBRATROUGH, TOBRAPEAK, TOBRARND, AMIKACINPEAK, AMIKACINTROU, AMIKACIN,  in the last 72 hours   Microbiology: No results found for this or any previous visit (from the past 720 hour(s)).  Medical History: Past Medical History  Diagnosis Date  . COPD (chronic obstructive pulmonary disease)     on home o2  . CVA (cerebral infarction)     hx  . Hypertensive heart disease with congestive heart failure   . Chronic cor pulmonale   . Chronic kidney disease (CKD), stage IV (severe)   . Hyperlipdemia   . Obesity (BMI 30-39.9)   . OSA (obstructive sleep apnea)     Does not use CPAP  . History of gout 09/07/2011  . Chronic combined systolic and diastolic CHF (congestive heart failure) 7/13    a. With cor pulmonale - EF 45-50%.  . Anemia   . Vascular disease   . Asthma   . Hypertension   . Diabetes mellitus   . GERD (gastroesophageal reflux disease)   . Arthritis   . RBBB   . Thrombocytopenia   . Acute and chronic respiratory failure     a. Trache placed 11/2011 for recurrent intubation (hypercarbic resp failure), post-intubation vocal cord edema, several  issues since that time wih trache. b. On home O2 with trache.  . Obesity, unspecified 08/28/2012  . Obesity hypoventilation syndrome   . Lung collapse     a. Chronic LLL collapse.  . Cardiac arrest     a. Asystolic cardiac arrest 02/2012 in setting of a/c respiratory failure (ischemic w/u has been deferred due to worsening CKD).  . NSTEMI (non-ST elevated myocardial infarction)     a. 06/2012 felt type II - troponin peak 1.45 - ischemic eval has been deferred due to CKD. Started on Plavix.  . Pulmonary HTN     a. mild pulm HTN by R[facility] 08/2011, felt due to lung disease. b. PA pressure by echo 06/2012.  Marland Kitchen NSVT (nonsustained ventricular tachycardia)     a. Noted on tele, admission in 06/2012.  Marland Kitchen Cirrhosis     a. Suspected on abd CT 12/2011.    Medications:  Prescriptions prior to admission  Medication Sig Dispense Refill  . albuterol (PROVENTIL) (5 MG/ML) 0.5% nebulizer solution Take 0.5 mLs (2.5 mg total) by nebulization 4 (four) times daily.  20 mL  12  . arformoterol (BROVANA) 15 MCG/2ML NEBU Take 2 mLs (15 mcg total) by nebulization 2 (two) times daily.  120 mL  1  . Ascorbic Acid (VITAMIN C PO) Take 1 tablet by mouth daily.      Marland Kitchen aspirin 81 MG chewable tablet Chew 1 tablet (81 mg total) by  mouth daily.      . budesonide (PULMICORT) 0.25 MG/2ML nebulizer solution Take 2 mLs (0.25 mg total) by nebulization 2 (two) times daily.  60 mL  12  . amLODipine (NORVASC) 10 MG tablet Take 10 mg by mouth daily.      . Artificial Saliva (BIOTENE MOISTURIZING MOUTH) SOLN Use as directed 1 spray in the mouth or throat daily.      Marland Kitchen atorvastatin (LIPITOR) 40 MG tablet Take 1 tablet (40 mg total) by mouth daily.  30 tablet  1  . busPIRone (BUSPAR) 5 MG tablet Take 5 mg by mouth 2 (two) times daily.      . carvedilol (COREG) 6.25 MG tablet Take 3 tablets (18.75 mg total) by mouth 2 (two) times daily with a meal.      . cloNIDine (CATAPRES) 0.2 MG tablet Take 0.5 tablets (0.1 mg total) by mouth 2 (two)  times daily.  60 tablet  11  . clopidogrel (PLAVIX) 75 MG tablet Take 1 tablet (75 mg total) by mouth daily with breakfast.  30 tablet  11  . dextromethorphan-guaiFENesin (MUCINEX DM) 30-600 MG per 12 hr tablet Take 1 tablet by mouth 2 (two) times daily.  60 tablet  0  . ferrous sulfate 325 (65 FE) MG EC tablet Take 1 tablet (325 mg total) by mouth 2 (two) times daily.  60 tablet  3  . hydrALAZINE (APRESOLINE) 100 MG tablet Take 100 mg by mouth 3 (three) times daily.      Marland Kitchen ipratropium (ATROVENT) 0.02 % nebulizer solution Take 2.5 mLs (0.5 mg total) by nebulization 4 (four) times daily.  75 mL  12  . isosorbide mononitrate (IMDUR) 30 MG 24 hr tablet Take 1 tablet (30 mg total) by mouth daily.  30 tablet  1  . LORazepam (ATIVAN) 0.5 MG tablet Take 1 tablet (0.5 mg total) by mouth every 6 (six) hours as needed for anxiety.  30 tablet  0  . metoCLOPramide (REGLAN) 5 MG tablet Take 5 mg by mouth 4 (four) times daily.      Marland Kitchen oxycodone (OXY-IR) 5 MG capsule Take 5 mg by mouth every 6 (six) hours as needed (for moderate pain).      . pantoprazole (PROTONIX) 20 MG tablet Take 40 mg by mouth daily.      . potassium chloride (K-DUR) 10 MEQ tablet Take 1 tablet (10 mEq total) by mouth 2 (two) times daily.  30 tablet  6  . sertraline (ZOLOFT) 100 MG tablet Take 100 mg by mouth at bedtime.      . torsemide (DEMADEX) 20 MG tablet Take 3 tablets (60 mg total) by mouth daily. Please file dose change patient has medication  90 tablet  6   Assessment: SOB 72 y/o F with COPD, anemia, HLD, MD, dCHF, MI who presents with SOB x several days. Now with fevers, chills, malaise, productive cough. Most recent hospitalization 8/14. Admit and treat for COPD exac +/- HCAP. Last known Scr 2.58. Currently Scr 2.89 with estimated CrCl 18.  Goal of Therapy:  Vancomycin trough level 15-20 mcg/ml  Plan:  Cefepime 1g IV q24h Vancomycin 1g IV q48h. Trough at steady state.  [name], [name] 12/17/2012,6:17 PM

## 2012-12-17 NOTE — ED Notes (Signed)
Patient developed shortness of breath recently worsening overtime one day ago and continued today.  Clear productive cough.  Home nurse placed patient on 6 Liter EMS decreased to 4L [place] 100% oxygen saturation.

## 2012-12-17 NOTE — Progress Notes (Signed)
Went to bedside to eval trach, pt wants to wait to take breathing treatment and to have sputum culture obtained later this evening d/t pt wants to eat now. RN aware.

## 2012-12-17 NOTE — H&P (Addendum)
[facility] History and Physical  PENIEL BIEL WJX:[id number] DOB: 12/20/1940 DOA: 12/17/2012  Referring physician: ED physician PCP: Gwynneth Aliment, MD   Chief Complaint: shortness of breath   HPI:  Pt is 72 yo female with COPD, HT, diastolic CHF, MI who presents to [facility] ED with main concern of progressively worsening shortness of breath that initially started several days prior to this admission, initially present with exertion and now progressed to dyspnea at rest, associated with subjective fevers, chill, malaise, cough productive of yellow sputum. Pt denies known sick contacts or exposures, has been recently hospitalized and discharged in August 2014. She reports no chest pain other than when present with coughing spells, no noticeable worsening LE edema, no noticeable weight gain, no specific abdominal or urinary concerns.   In ED, pt noted to be dyspneic with oxygen saturation in 80's which has quickly improved to mid 90's with 4 L [place]. CXR with LLL opacity and CT chest recommended for clearer evaluation. TR[facility] asked to admit for further evaluation and treatment of presumptive HCAP with COPD exacerbation. Telemetry bed requested.   Assessment and Plan:  Principal Problem:   Acute-on-chronic respiratory failure - this appears to be multifactorial in etiology and secondary to COPD exacerbation with ? HCAP - will place order for CT chest for clearer evaluation - will place on empiric ABX broad spectrum for now until sputum analysis and final cultures back - plan on tapering ABX as clinically indicated  Active Problems:   C O P D - treatment with BD's scheduled and as needed - also place on Solumedrol and oxygen, empiric ABX as noted above    Acute on chronic renal failure - last Cr in 10/2012 was 2.58, so this new elevation is likely pre renal etiology imposed on CKD - will monitor closely as pt in Torsemide - will continue torsemide but if Cr trending up we can consider  holding it and placing on IVF for several hours - encouraged PO intake and repeat BMP in AM   Anemia - of chronic disease - no signs of active bleed - continue Iron as per home medical regimen - CBC in AM   CHF (congestive heart failure), diastolic - pt appears euvolemic - will check BNP, continue Torsemide - strict I's and O's, renal function monitoring    Type 2 diabetes mellitus with vascular disease - will check A1C - as will place on Solumedrol, may need to add SSI   Hyperlipdemia - check lipid panel   Code Status: Full Family Communication: Pt at bedside Disposition Plan: Admit to telemetry bed   Review of Systems:  Constitutional: Negative for diaphoresis.  HENT: Negative for hearing loss, ear pain, nosebleeds, congestion, sore throat, neck pain, tinnitus and ear discharge.   Eyes: Negative for blurred vision, double vision, photophobia, pain, discharge and redness.  Respiratory: Negative for hemoptysis, and stridor.   Cardiovascular: Negative for palpitations, orthopnea, claudication and leg swelling.  Gastrointestinal: Negative for nausea, vomiting and abdominal pain. Negative for heartburn, constipation, blood in stool and melena.  Genitourinary: Negative for dysuria, urgency, frequency, hematuria and flank pain.  Musculoskeletal: Negative for myalgias, back pain, joint pain and falls.  Skin: Negative for itching and rash.  Neurological: Negative for tingling, tremors, sensory change, speech change, focal weakness, loss of consciousness and headaches.  Endo/Heme/Allergies: Negative for environmental allergies and polydipsia. Does not bruise/bleed easily.  Psychiatric/Behavioral: Negative for suicidal ideas. The patient is not nervous/anxious.      Past Medical History  Diagnosis Date  . COPD (chronic obstructive pulmonary disease)     on home o2  . CVA (cerebral infarction)     hx  . Hypertensive heart disease with congestive heart failure   . Chronic cor  pulmonale   . Chronic kidney disease (CKD), stage IV (severe)   . Hyperlipdemia   . Obesity (BMI 30-39.9)   . OSA (obstructive sleep apnea)     Does not use CPAP  . History of gout 09/07/2011  . Chronic combined systolic and diastolic CHF (congestive heart failure) 7/13    a. With cor pulmonale - EF 45-50%.  . Anemia   . Vascular disease   . Asthma   . Hypertension   . Diabetes mellitus   . GERD (gastroesophageal reflux disease)   . Arthritis   . RBBB   . Thrombocytopenia   . Acute and chronic respiratory failure     a. Trache placed 11/2011 for recurrent intubation (hypercarbic resp failure), post-intubation vocal cord edema, several issues since that time wih trache. b. On home O2 with trache.  . Obesity, unspecified 08/28/2012  . Obesity hypoventilation syndrome   . Lung collapse     a. Chronic LLL collapse.  . Cardiac arrest     a. Asystolic cardiac arrest 02/2012 in setting of a/c respiratory failure (ischemic w/u has been deferred due to worsening CKD).  . NSTEMI (non-ST elevated myocardial infarction)     a. 06/2012 felt type II - troponin peak 1.45 - ischemic eval has been deferred due to CKD. Started on Plavix.  . Pulmonary HTN     a. mild pulm HTN by [facility] 08/2011, felt due to lung disease. b. PA pressure by echo 06/2012.  Marland Kitchen NSVT (nonsustained ventricular tachycardia)     a. Noted on tele, admission in 06/2012.  Marland Kitchen Cirrhosis     a. Suspected on abd CT 12/2011.    Past Surgical History  Procedure Laterality Date  . Bilateral oophorectomy    . Cardiac catheterization      right heart cath  . Laparoscopic gastrostomy  12/21/2011    Procedure: LAPAROSCOPIC GASTROSTOMY;  Surgeon: Axel Filler, MD;  Location: [facility] OR;  Service: General;  Laterality: N/A;  laparoscopic gastrostomy possible open feeding tube  . Abdominal hysterectomy    . Tracheostomy  11/2011  . Cataracts    . Tracheostomy tube placement  02/18/2012    Procedure: TRACHEOSTOMY;  Surgeon: Serena Colonel, MD;   Location: [facility] OR;  Service: ENT;  Laterality: N/A;  . Tracheostomy revision N/A 09/01/2012    Procedure: TRACHEOSTOMY REVISION;  Surgeon: Serena Colonel, MD;  Location: [facility] OR;  Service: ENT;  Laterality: N/A;    Social History:  reports that she quit smoking about 15 months ago. Her smoking use included Cigarettes. She has a 55 pack-year smoking history. She has never used smokeless tobacco. She reports that she does not drink alcohol or use illicit drugs.  Allergies  Allergen Reactions  . Other Itching    "Wool"  . Sulfonamide Derivatives Hives and Rash    Family History  Problem Relation Age of Onset  . Heart attack Father   . Stroke Mother     CVA  . Coronary artery disease Daughter     Prior to Admission medications   Medication Sig Start Date End Date Taking? Authorizing Provider  albuterol (PROVENTIL) (5 MG/ML) 0.5% nebulizer solution Take 0.5 mLs (2.5 mg total) by nebulization 4 (four) times daily. 09/14/12   Christiane Ha, MD  amLODipine (NORVASC) 10 MG tablet Take 10 mg by mouth daily.    Historical Provider, MD  arformoterol (BROVANA) 15 MCG/2ML NEBU Take 2 mLs (15 mcg total) by nebulization 2 (two) times daily. 07/16/12   Renae Fickle, MD  Artificial Saliva (BIOTENE MOISTURIZING MOUTH) SOLN Use as directed 1 spray in the mouth or throat daily.    Historical Provider, MD  Ascorbic Acid (VITAMIN C PO) Take 1 tablet by mouth daily.    Historical Provider, MD  aspirin 81 MG chewable tablet Chew 1 tablet (81 mg total) by mouth daily. 02/19/12   Simonne Martinet, NP  atorvastatin (LIPITOR) 40 MG tablet Take 1 tablet (40 mg total) by mouth daily. 07/16/12   Renae Fickle, MD  budesonide (PULMICORT) 0.25 MG/2ML nebulizer solution Take 2 mLs (0.25 mg total) by nebulization 2 (two) times daily. 07/16/12   Renae Fickle, MD  busPIRone (BUSPAR) 5 MG tablet Take 5 mg by mouth 2 (two) times daily.    Historical Provider, MD  carvedilol (COREG) 6.25 MG tablet Take 3 tablets (18.75 mg  total) by mouth 2 (two) times daily with a meal. 10/20/12   Esperanza Sheets, MD  cloNIDine (CATAPRES) 0.2 MG tablet Take 0.5 tablets (0.1 mg total) by mouth 2 (two) times daily. 07/16/12   Renae Fickle, MD  clopidogrel (PLAVIX) 75 MG tablet Take 1 tablet (75 mg total) by mouth daily with breakfast. 07/16/12   Renae Fickle, MD  dextromethorphan-guaiFENesin [facility] DM) 30-600 MG per 12 hr tablet Take 1 tablet by mouth 2 (two) times daily. 07/16/12   Renae Fickle, MD  ferrous sulfate 325 (65 FE) MG EC tablet Take 1 tablet (325 mg total) by mouth 2 (two) times daily. 07/16/12   Renae Fickle, MD  hydrALAZINE (APRESOLINE) 100 MG tablet Take 100 mg by mouth 3 (three) times daily.    Historical Provider, MD  ipratropium (ATROVENT) 0.02 % nebulizer solution Take 2.5 mLs (0.5 mg total) by nebulization 4 (four) times daily. 09/14/12   Christiane Ha, MD  isosorbide mononitrate (IMDUR) 30 MG 24 hr tablet Take 1 tablet (30 mg total) by mouth daily. 05/19/12   Henderson Cloud, MD  LORazepam (ATIVAN) 0.5 MG tablet Take 1 tablet (0.5 mg total) by mouth every 6 (six) hours as needed for anxiety. 09/14/12   Christiane Ha, MD  metoCLOPramide (REGLAN) 5 MG tablet Take 5 mg by mouth 4 (four) times daily.    Historical Provider, MD  oxycodone (OXY-IR) 5 MG capsule Take 5 mg by mouth every 6 (six) hours as needed (for moderate pain).    Historical Provider, MD  pantoprazole (PROTONIX) 20 MG tablet Take 40 mg by mouth daily.    Historical Provider, MD  potassium chloride (K-DUR) 10 MEQ tablet Take 1 tablet (10 mEq total) by mouth 2 (two) times daily. 11/03/12   Aundria Rud, NP  sertraline (ZOLOFT) 100 MG tablet Take 100 mg by mouth at bedtime.    Historical Provider, MD  torsemide (DEMADEX) 20 MG tablet Take 3 tablets (60 mg total) by mouth daily. Please file dose change patient has medication 11/14/12   Aundria Rud, NP    Physical Exam: Filed Vitals:   12/17/12 1345 12/17/12 1450  BP:  141/64   Pulse: 66   Temp: 99.4 F (37.4 C)   TempSrc: Oral   Resp: 16   SpO2: 100% 100%    Physical Exam  Constitutional: Appears well-developed and well-nourished. No distress.  HENT: Normocephalic. External right  and left ear normal. Dry MM Eyes: Conjunctivae and EOM are normal. PERRLA, no scleral icterus.  Neck: Normal ROM. Neck supple. No JVD. No tracheal deviation. No thyromegaly. Trach CVS: RRR, S1/S2 +, no murmurs, no gallops, no carotid bruit.  Pulmonary: Course breath sounds bilaterally, end expiratory wheezing, crackles at bases  Abdominal: Soft. BS +,  no distension, tenderness, rebound or guarding.  Musculoskeletal: Normal range of motion. +1 bilateral LE pitting edema and no tenderness.  Lymphadenopathy: No lymphadenopathy noted, cervical, inguinal. Neuro: Alert. Normal reflexes, muscle tone coordination. No cranial nerve deficit. Skin: Skin is warm and dry. No rash noted. Not diaphoretic. No erythema. No pallor.  Psychiatric: Normal mood and affect. Behavior, judgment, thought content normal.   Labs on Admission:  Basic Metabolic Panel:  Recent Labs Lab 12/17/12 1403  NA 143  K 3.6  CL 106  CO2 26  GLUCOSE 97  BUN 41*  CREATININE 2.89*  CALCIUM 9.5   Liver Function Tests:  Recent Labs Lab 12/17/12 1403  AST 12  ALT 6  ALKPHOS 74  BILITOT 0.3  PROT 6.3  ALBUMIN 3.2*   CBC:  Recent Labs Lab 12/17/12 1403  WBC 5.9  HGB 9.2*  HCT 28.6*  MCV 81.3  PLT 152   Radiological Exams on Admission: Dg Chest Portable 1 View   12/17/2012   Persistent consolidation in the left lower lobe. Given the chronicity of this finding, it may be prudent to correlate with chest CT or bronchoscopy to further assess. No new opacity is seen. Heart is prominent but stable.    EKG: Normal sinus rhythm, no ST/T wave changes  Debbora Presto, MD  [facility] Pager [phone]  If 7PM-7AM, please contact night-coverage [email or web address] Password  [facility] 12/17/2012, 4:24 PM

## 2012-12-18 DIAGNOSIS — E119 Type 2 diabetes mellitus without complications: Secondary | ICD-10-CM

## 2012-12-18 DIAGNOSIS — D649 Anemia, unspecified: Secondary | ICD-10-CM

## 2012-12-18 DIAGNOSIS — N184 Chronic kidney disease, stage 4 (severe): Secondary | ICD-10-CM

## 2012-12-18 DIAGNOSIS — J9620 Acute and chronic respiratory failure, unspecified whether with hypoxia or hypercapnia: Principal | ICD-10-CM

## 2012-12-18 LAB — BASIC METABOLIC PANEL
BUN: 43 mg/dL — ABNORMAL HIGH (ref 6–23)
Calcium: 9 mg/dL (ref 8.4–10.5)
Chloride: 104 mEq/L (ref 96–112)
Creatinine, Ser: 2.88 mg/dL — ABNORMAL HIGH (ref 0.50–1.10)
GFR calc Af Amer: 18 mL/min — ABNORMAL LOW (ref >90)
GFR calc non Af Amer: 15 mL/min — ABNORMAL LOW (ref >90)
Potassium: 4.1 mEq/L (ref 3.5–5.1)

## 2012-12-18 LAB — CBC
Hemoglobin: 9 g/dL — ABNORMAL LOW (ref 12.0–15.0)
MCH: 26.4 pg (ref 26.0–34.0)
MCHC: 32.6 g/dL (ref 30.0–36.0)
Platelets: 150 10*3/uL (ref 150–400)
RDW: 16.9 % — ABNORMAL HIGH (ref 11.5–15.5)

## 2012-12-18 LAB — LEGIONELLA ANTIGEN, URINE

## 2012-12-18 LAB — LIPID PANEL
LDL Cholesterol: 88 mg/dL (ref 0–99)
Total CHOL/HDL Ratio: 3.1 RATIO

## 2012-12-18 LAB — HEMOGLOBIN A1C: Hgb A1c MFr Bld: 4.2 % (ref <5.7)

## 2012-12-18 MED ORDER — CARVEDILOL 6.25 MG PO TABS
18.7500 mg | ORAL_TABLET | Freq: Two times a day (BID) | ORAL | Status: DC
  Administered 2012-12-18 – 2012-12-20 (×6): 18.75 mg via ORAL
  Filled 2012-12-18 (×7): qty 1

## 2012-12-18 MED ORDER — ONDANSETRON HCL 4 MG/2ML IJ SOLN: 4.0000 mg | Freq: Four times a day (QID) | INTRAMUSCULAR | Status: DC | PRN

## 2012-12-18 MED ORDER — DOXYCYCLINE HYCLATE 100 MG PO TABS
100.0000 mg | ORAL_TABLET | Freq: Two times a day (BID) | ORAL | Status: DC
  Administered 2012-12-18 – 2012-12-20 (×5): 100 mg via ORAL
  Filled 2012-12-18 (×6): qty 1

## 2012-12-18 NOTE — Progress Notes (Signed)
   Advanced HF Team Note  Patient well known to HF team. Admitted for COPD flare +/- PNA. Well compensated from HF standpoint. Will follow at a distance. Please call with questions. Appreciate Dr. [name] care.   Felicia Montejano,MD 5:07 PM

## 2012-12-18 NOTE — Progress Notes (Signed)
OT Cancellation Note  Patient Details Name: [name] MRN: [id number] DOB: March 02, 1940   Cancelled Treatment:    Reason Eval/Treat Not Completed: Medical issues which prohibited therapy;Other (comment) (patient reports that she was sick during the night and just wanted to be still.)  Will attempt again tomorrow as time allows.  [name] 12/18/2012, 2:58 PM

## 2012-12-18 NOTE — Progress Notes (Signed)
Patient is currently active with [facility] Care Management for chronic disease management services.  Patient has been engaged by a [employer], pharmacist for medication review, and LCSW for community resources.  Our community based plan of care has focused on disease management of her COPD/CHF, medication procurement/adherence, and community resource support  Patient will receive a post discharge transition of care call and will be evaluated for monthly home visits for assessments and disease process education.  Made Inpatient Case Manager aware that [facility] Care Management following. Of note, [facility] Care Management services does not replace or interfere with any services that are arranged by inpatient case management or social work.  For additional questions or referrals please contact Anibal Henderson BSN RN [facility] Liaison at [phone].

## 2012-12-18 NOTE — Progress Notes (Signed)
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 D  - treatment with BD's scheduled and as needed  - continue Solumedrol and oxygen, empiric ABX as noted above and pulmicort   Acute on chronic renal failure  - last Cr in 10/2012 was 2.58, but her baseline is around 2.8-2.9 - will monitor closely as pt on Torsemide  - will continue torsemide but if Cr trending up we can consider holding it and give a gentle hydration  - encouraged PO intake and repeat BMP in AM   Anemia  - of chronic disease  - no signs of active bleed  - continue Iron as per home medical regimen   CHF (congestive heart failure), diastolic  - pt appears euvolemic and with heart failure well compensated (BNP at baseline essentially) - continue Torsemide  - strict I's and O's, renal function monitoring   Type 2 diabetes mellitus with vascular disease  -A1C 4.2 - as will place on Solumedrol, will use SSI  -but maintenance treatment as an outpatient is not needed -continue diet control  Hyperlipdemia  - lipid panel WNL  HTN:  -stable and well controlled. Continue current regimen  Depression -continue sertraline    Code Status: Full Family Communication: no family at bedside Disposition Plan: home when medically stable   Consultants:  None   Procedures:  See below for x-ray reports  Antibiotics:  vanc and cefepime 10/26--10/27  Doxycycline 10/27  HPI/Subjective: Feeling better and breathing better. No fever. Reports she wants to go home when medically stable. CT has r/o PNA.   Objective: Filed Vitals:    12/18/12 1623  BP:   Pulse: 67  Temp:   Resp: 20    Intake/Output Summary (Last 24 hours) at 12/18/12 1859 Last data filed at 12/18/12 1830  Gross per 24 hour  Intake    560 ml  Output   1252 ml  Net   -692 ml   Filed Weights   12/17/12 1814 12/17/12 1847 12/18/12 0424  Weight: 80.3 kg (177 lb 0.5 oz) 79 kg (174 lb 2.6 oz) 78.5 kg (173 lb 1 oz)    Exam:   General:  Afebrile, feeling better and breathing more comfortable  Cardiovascular: positive SEM, no rubs , no gallops, S1 and S2  Respiratory: slight exp wheezing, no crackles, fair air movement  Abdomen: soft, NT, ND, positive BS  Musculoskeletal: no edema, no cyanosis or clubbing  Data Reviewed: Basic Metabolic Panel:  Recent Labs Lab 12/17/12 1403 12/18/12 0225  NA 143 139  K 3.6 4.1  CL 106 104  CO2 26 24  GLUCOSE 97 184*  BUN 41* 43*  CREATININE 2.89* 2.88*  CALCIUM 9.5 9.0   Liver Function Tests:  Recent Labs Lab 12/17/12 1403  AST 12  ALT 6  ALKPHOS 74  BILITOT 0.3  PROT 6.3  ALBUMIN 3.2*   CBC:  Recent Labs Lab 12/17/12 1403 12/18/12 0225  WBC 5.9 6.2  HGB 9.2* 9.0*  HCT 28.6* 27.6*  MCV 81.3 80.9  PLT 152 150   Cardiac Enzymes:  Recent Labs Lab 12/18/12 0225 12/18/12 0659 12/18/12 1355  TROPONINI <0.30 <0.30 <0.30   BNP (last  3 results)  Recent Labs  10/11/12 0500 11/03/12 1021 12/17/12 1952  PROBNP 2714.0* 1855.0* 1973.0*    Recent Results (from the past 240 hour(s))  CULTURE, RESPIRATORY (NON-EXPECTORATED)     Status: None   Collection Time    12/17/12  9:02 PM      Result Value Range Status   Specimen Description TRACHEAL ASPIRATE   Final   Special Requests NONE   Final   Gram Stain     Final   Value: RARE WBC PRESENT, PREDOMINANTLY PMN     NO SQUAMOUS EPITHELIAL CELLS SEEN     RARE GRAM POSITIVE RODS     RARE GRAM NEGATIVE RODS     Performed at [facility]   Culture PENDING   Incomplete   Report Status PENDING   Incomplete      Studies: Ct Chest Wo Contrast  12/17/2012   CLINICAL DATA:  Evaluate left lung opacity. The tracheostomy with respiratory failure.  EXAM: CT CHEST WITHOUT CONTRAST  TECHNIQUE: Multidetector CT imaging of the chest was performed following the standard protocol without IV contrast.  COMPARISON:  11/27/2012 chest x-ray.  01/04/2012 chest CT.  FINDINGS: THORACIC INLET/BODY WALL:  Stable appearance of tracheostomy tube.  MEDIASTINUM:  Cardiomegaly. No pericardial effusion. Extensive coronary artery atherosclerosis. Extensive aortic and great vessel atherosclerosis, without evidence of acute abnormality. No mediastinal lymphadenopathy. Small sliding-type hiatal hernia.  LUNG WINDOWS:  Chronic elevation of the left diaphragm, with chronic bandlike opacities overlying - consistent with scarring. No evidence of consolidation or mass. No effusion or pneumothorax.  UPPER ABDOMEN:  Numerous bilateral low dense renal masses, partly imaged, are similar in appearance to prior. Bilateral adrenal masses, with the largest on the right measuring approximately 3 cm. Appearance/stability remain consistent with adenomas.  OSSEOUS:  No acute findings.  IMPRESSION: Left lung opacity represents chronic elevation of the left diaphragm with overlying scarring. No evidence of mass or pneumonia.   Electronically Signed   By: Tiburcio Pea M.D.   On: 12/17/2012 23:03   Dg Chest Portable 1 View  12/17/2012   CLINICAL DATA:  Chest pain and shortness of breath  EXAM: PORTABLE CHEST - 1 VIEW  COMPARISON:  October 09, 2012  FINDINGS: There is persistent consolidation in the left lower lobe. There is atelectatic change in the left mid lung. The right lung is clear. Heart is borderline prominent with normal pulmonary vascularity.  Tracheostomy is well seated. No pneumothorax. No adenopathy. No bone lesions.  IMPRESSION: Persistent consolidation in the left lower lobe. Given the chronicity of this finding, it may be prudent to correlate with  chest CT or bronchoscopy to further assess. No new opacity is seen. Heart is prominent but stable.   Electronically Signed   By: Bretta Bang M.D.   On: 12/17/2012 15:27    Scheduled Meds: . albuterol  2.5 mg Nebulization QID  . amLODipine  10 mg Oral Daily  . arformoterol  15 mcg Nebulization BID  . aspirin  81 mg Oral Daily  . atorvastatin  40 mg Oral q1800  . budesonide  0.25 mg Nebulization BID  . busPIRone  5 mg Oral BID  . carvedilol  18.75 mg Oral BID WC  . cloNIDine  0.1 mg Oral BID  . clopidogrel  75 mg Oral Q breakfast  . doxycycline  100 mg Oral Q12H  . enoxaparin (LOVENOX) injection  30 mg Subcutaneous Q24H  . ferrous sulfate  325 mg Oral BID WC  . hydrALAZINE  100  mg Oral Q8H  . ipratropium  0.5 mg Nebulization QID  . isosorbide mononitrate  30 mg Oral Daily  . methylPREDNISolone (SOLU-MEDROL) injection  40 mg Intravenous Q6H  . metoCLOPramide  5 mg Oral QID  . pantoprazole  40 mg Oral Daily  . potassium chloride  10 mEq Oral BID  . sertraline  100 mg Oral QHS  . 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 If 7PM-7AM, please contact night-coverage at [email or web address], password [facility] 12/18/2012, 6:59 PM  LOS: 1 day

## 2012-12-18 NOTE — Progress Notes (Signed)
UR completed. Inetta Dicke RN CCM Case Mgmt 

## 2012-12-18 NOTE — Progress Notes (Signed)
Patient's sat up in bed and HR dropped to 35. She complained of nausea and vomited approximately 50 cc(clear with food particles). After vomiting she said she felt OK. During episode heart rate remained at 35 for approximately min. Episode relayed to T. Claiborne Billings PA. Orders given for EKG and Cardiac enzymes.

## 2012-12-18 NOTE — Plan of Care (Signed)
Problem: Phase I Progression Outcomes Goal: Dyspnea controlled at rest Outcome: Progressing Pt received Living with COPD packet

## 2012-12-18 NOTE — Progress Notes (Addendum)
Physical Therapy Evaluation Patient Details Name: [name] MRN: [id number] DOB: 09-22-1940 Today's Date: 12/18/2012 Time: 8295-6213 PT Time Calculation (min): 34 min  PT Assessment / Plan / Recommendation History of Present Illness  This is a 72 year old female, with history of Obesity, OSA, Asthma/COPD, chronic respiratory failure, s/p Trach, on home O2 at 4L/Min, Chronic Cor Pulmonale, HTN, s/p CVA, Cardiomyopathy, CHF, EF 45%, Gout, Dyslipidemia, chronic anemia/Thrombocytopenia, Diet-controlled DM-2, GERD, OA, presenting with progressive shortness of breath with acute on chronic respiratory failure.   Clinical Impression  Pt admitted with above. Pt currently with functional limitations due to the deficits listed below (see PT Problem List). Should progress and be able to go home as PTA with HHPT f/u.  Also recommend HHSW and HHRN to assist with medical management.   Pt will benefit from skilled PT to increase their independence and safety with mobility to allow discharge to the venue listed below.      PT Assessment  Patient needs continued PT services    Follow Up Recommendations  Home health PT;Supervision/Assistance - 24 hour                Equipment Recommendations  None recommended by PT         Frequency Min 3X/week    Precautions / Restrictions Precautions Precautions: None Restrictions Weight Bearing Restrictions: No   Pertinent Vitals/Pain O2 sat 97% on trach collar, HR stable, No pain      Mobility  Bed Mobility Bed Mobility: Rolling Left;Left Sidelying to Sit;Sitting - Scoot to Edge of Bed Rolling Left: 5: Supervision Left Sidelying to Sit: 5: Supervision Sitting - Scoot to Edge of Bed: 5: Supervision Transfers Transfers: Sit to Stand;Stand to [employer] Transfers Sit to Stand: 4: Min guard;With upper extremity assist;From bed Stand to Sit: 4: Min guard;With upper extremity assist;To bed Stand Pivot Transfers: 4: Min guard Details for  Transfer Assistance: Pt fairly safe overall with stand pivot transfer to chair.   Ambulation/Gait Ambulation/Gait Assistance: Not tested (comment) Stairs: No Wheelchair Mobility Wheelchair Mobility: No         PT Diagnosis: Generalized weakness  PT Problem List: Decreased activity tolerance;Decreased balance;Decreased mobility;Decreased knowledge of use of DME;Decreased knowledge of precautions;Decreased safety awareness PT Treatment Interventions: DME instruction;Gait training;Functional mobility training;Therapeutic activities;Therapeutic exercise;Balance training;Patient/family education     PT Goals(Current goals can be found in the care plan section) Acute Rehab PT Goals Patient Stated Goal: to gohome PT Goal Formulation: With patient Time For Goal Achievement: 12/25/12 Potential to Achieve Goals: Good  Visit Information  Last PT Received On: 12/18/12 Assistance Needed: +1 History of Present Illness: This is a 72 year old female, with history of Obesity, OSA, Asthma/COPD, chronic respiratory failure, s/p Trach, on home O2 at 4L/Min, Chronic Cor Pulmonale, HTN, s/p CVA, Cardiomyopathy, CHF, EF 45%, Gout, Dyslipidemia, chronic anemia/Thrombocytopenia, Diet-controlled DM-2, GERD, OA, presenting with progressive shortness of breath with acute on chronic respiratory failure.        Prior Functioning  Home Living Family/patient expects to be discharged to:: Private residence Living Arrangements: Children;Other relatives Available Help at Discharge: Family;Available PRN/intermittently Type of Home: House Home Access: Level entry Home Layout: One level Home Equipment: Walker - 2 wheels;Wheelchair - manual;Grab bars - toilet;Grab bars - tub/shower;Shower seat;Cane - single point Additional Comments: Pedaled wheelchair with feet around house, bed to bathroom used cane Prior Function Level of Independence: Independent with assistive device(s) Comments: Needed assist with  suctioning Communication Communication: No difficulties;Passy-Muir valve;Tracheostomy Dominant Hand:  Right    Cognition  Cognition Arousal/Alertness: Awake/alert Behavior During Therapy: WFL for tasks assessed/performed Overall Cognitive Status: Within Functional Limits for tasks assessed    Extremity/Trunk Assessment Upper Extremity Assessment Upper Extremity Assessment: Defer to OT evaluation Lower Extremity Assessment Lower Extremity Assessment: Generalized weakness Cervical / Trunk Assessment Cervical / Trunk Assessment: Normal   Balance Balance Balance Assessed: Yes Static Standing Balance Static Standing - Balance Support: During functional activity;Bilateral upper extremity supported Static Standing - Level of Assistance: 5: Stand by assistance Static Standing - Comment/# of Minutes: Pt stood holding onto 3N1 while PT cleaned bottom.    End of Session PT - End of Session Equipment Utilized During Treatment: Gait belt;Oxygen Activity Tolerance: Patient limited by fatigue Patient left: in bed;with call bell/phone within reach Nurse Communication: Mobility status       [name],Orli Degrave 12/18/2012, 9:29 AM  [facility] [phone] (pager)

## 2012-12-19 DIAGNOSIS — K219 Gastro-esophageal reflux disease without esophagitis: Secondary | ICD-10-CM

## 2012-12-19 MED ORDER — ALBUTEROL SULFATE (5 MG/ML) 0.5% IN NEBU: 2.5000 mg | INHALATION_SOLUTION | Freq: Four times a day (QID) | RESPIRATORY_TRACT | Status: DC

## 2012-12-19 MED ORDER — BUDESONIDE 0.25 MG/2ML IN SUSP
0.2500 mg | Freq: Two times a day (BID) | RESPIRATORY_TRACT | Status: DC
  Administered 2012-12-19 – 2012-12-20 (×2): 0.25 mg via RESPIRATORY_TRACT
  Filled 2012-12-19 (×4): qty 2

## 2012-12-19 MED ORDER — INFLUENZA VAC SPLIT QUAD 0.5 ML IM SUSP
0.5000 mL | INTRAMUSCULAR | Status: AC
  Administered 2012-12-20: 0.5 mL via INTRAMUSCULAR
  Filled 2012-12-19: qty 0.5

## 2012-12-19 MED ORDER — PREDNISONE 50 MG PO TABS
50.0000 mg | ORAL_TABLET | Freq: Two times a day (BID) | ORAL | Status: DC
  Administered 2012-12-20 (×2): 50 mg via ORAL
  Filled 2012-12-19 (×3): qty 1

## 2012-12-19 MED ORDER — IPRATROPIUM BROMIDE 0.02 % IN SOLN
0.5000 mg | Freq: Four times a day (QID) | RESPIRATORY_TRACT | Status: DC
  Administered 2012-12-19 – 2012-12-20 (×5): 0.5 mg via RESPIRATORY_TRACT
  Filled 2012-12-19 (×3): qty 2.5

## 2012-12-19 MED ORDER — ARFORMOTEROL TARTRATE 15 MCG/2ML IN NEBU
15.0000 ug | INHALATION_SOLUTION | Freq: Two times a day (BID) | RESPIRATORY_TRACT | Status: DC
  Administered 2012-12-19 – 2012-12-20 (×2): 15 ug via RESPIRATORY_TRACT
  Filled 2012-12-19 (×4): qty 2

## 2012-12-19 MED ORDER — IPRATROPIUM BROMIDE 0.02 % IN SOLN
0.5000 mg | Freq: Four times a day (QID) | RESPIRATORY_TRACT | Status: DC
  Filled 2012-12-19: qty 2.5

## 2012-12-19 MED ORDER — ALBUTEROL SULFATE (5 MG/ML) 0.5% IN NEBU
2.5000 mg | INHALATION_SOLUTION | Freq: Four times a day (QID) | RESPIRATORY_TRACT | Status: DC
  Filled 2012-12-19: qty 0.5

## 2012-12-19 MED ORDER — IPRATROPIUM BROMIDE 0.02 % IN SOLN: 0.5000 mg | Freq: Four times a day (QID) | RESPIRATORY_TRACT | Status: DC

## 2012-12-19 MED ORDER — BIOTENE DRY MOUTH MT LIQD
15.0000 mL | Freq: Two times a day (BID) | OROMUCOSAL | Status: DC
  Administered 2012-12-20: 14:00:00 15 mL via OROMUCOSAL

## 2012-12-19 MED ORDER — ALBUTEROL SULFATE (5 MG/ML) 0.5% IN NEBU
2.5000 mg | INHALATION_SOLUTION | Freq: Four times a day (QID) | RESPIRATORY_TRACT | Status: DC
  Administered 2012-12-19 – 2012-12-20 (×5): 2.5 mg via RESPIRATORY_TRACT
  Filled 2012-12-19 (×3): qty 0.5

## 2012-12-19 MED ORDER — CHLORHEXIDINE GLUCONATE 0.12 % MT SOLN
15.0000 mL | Freq: Two times a day (BID) | OROMUCOSAL | Status: DC
  Administered 2012-12-20: 15 mL via OROMUCOSAL
  Filled 2012-12-19 (×4): qty 15

## 2012-12-19 NOTE — Progress Notes (Signed)
Came to evaluate pt's trach and for trach education. Pt states she has this trach for approximately 3 years. She states that with the help of her daughter they take total care of trach at home. No education needed at this time. Will follow as needed.

## 2012-12-19 NOTE — Progress Notes (Signed)
Patient c/o pain 7/10 to head.  Oxycodone given x1 per MAR. RN will continue to assess. Louretta Parma, RN

## 2012-12-19 NOTE — Progress Notes (Signed)
[facility] PROGRESS NOTE  [name] AVW:[id number] DOB: 1940-04-23 DOA: 12/17/2012 PCP: Gwynneth Aliment, MD  Assessment/Plan: Acute-on-chronic respiratory failure  - this appears to be secondary to bronchitis/bronchiectasis and COPD exacerbation  - CT chest r/o PNA - will continue narrowed antibiotics for bronchitis with atypical coverage as part of tx for COPD exacerbation (doxycycline) -continue nebs, pulmicort and transition to PO prednisone and slow tapering; watch for rebound bronchoconstriction  C O P D  -treatment with BD's scheduled and as needed  -will transition to oral prednisone and start slow tapering continue oxygen, empiric ABX as noted above  -added pulmicort (consider discharge home on it for better control; especially nebulizer treatment)   Acute on chronic renal failure  -last Cr in 10/2012 was 2.58 (isolated value), but her baseline is around 2.8-2.9 -will monitor closely as pt on Torsemide  -will continue torsemide but if Cr trending up we can consider holding it and give a trial of gentle hydration  -encouraged PO intake and repeat BMP in AM   Anemia  - of chronic disease  - no signs of active bleed  - continue Iron as per home medication regimen   CHF (congestive heart failure), diastolic  -pt appears euvolemic and with heart failure well compensated (BNP at baseline essentially) -continue Torsemide and B-blockers - strict I's and O's, renal function monitoring   Type 2 diabetes mellitus with vascular disease  -A1C 4.2 -continue SSI while inpatient on steroids; but maintenance treatment as an outpatient is not needed -continue diet control at discharge  Hyperlipdemia  -lipid panel WNL  HTN:  -stable and well controlled. Continue current regimen  Depression -continue sertraline   Code Status: Full Family Communication: no family at bedside Disposition Plan: home when medically stable   Consultants:  None    Procedures:  See below for x-ray reports  Antibiotics:  vanc and cefepime 10/26--10/27  Doxycycline 10/27  HPI/Subjective: Feeling better and breathing better. No fever. Denies CP, abd pain and any other complaints. CT had r/o PNA. Most likely will be able to go home tomorrow.  Objective: Filed Vitals:   12/19/12 1522  BP:   Pulse: 64  Temp:   Resp: 16    Intake/Output Summary (Last 24 hours) at 12/19/12 1736 Last data filed at 12/19/12 1700  Gross per 24 hour  Intake   1200 ml  Output   1700 ml  Net   -500 ml   Filed Weights   12/17/12 1847 12/18/12 0424 12/19/12 0505  Weight: 79 kg (174 lb 2.6 oz) 78.5 kg (173 lb 1 oz) 79.062 kg (174 lb 4.8 oz)    Exam:   General:  Afebrile, feeling better and breathing comfortable  Cardiovascular: positive SEM, no rubs , no gallops, S1 and S2  Respiratory: slight exp wheezing, no crackles, fair air movement (improveing in comparison to yesterday)  Abdomen: soft, NT, ND, positive BS  Musculoskeletal: no edema, no cyanosis or clubbing  Data Reviewed: Basic Metabolic Panel:  Recent Labs Lab 12/17/12 1403 12/18/12 0225  NA 143 139  K 3.6 4.1  CL 106 104  CO2 26 24  GLUCOSE 97 184*  BUN 41* 43*  CREATININE 2.89* 2.88*  CALCIUM 9.5 9.0   Liver Function Tests:  Recent Labs Lab 12/17/12 1403  AST 12  ALT 6  ALKPHOS 74  BILITOT 0.3  PROT 6.3  ALBUMIN 3.2*   CBC:  Recent Labs Lab 12/17/12 1403 12/18/12 0225  WBC 5.9 6.2  HGB 9.2* 9.0*  HCT 28.6* 27.6*  MCV 81.3 80.9  PLT 152 150   Cardiac Enzymes:  Recent Labs Lab 12/18/12 0225 12/18/12 0659 12/18/12 1355  TROPONINI <0.30 <0.30 <0.30   BNP (last 3 results)  Recent Labs  10/11/12 0500 11/03/12 1021 12/17/12 1952  PROBNP 2714.0* 1855.0* 1973.0*    Recent Results (from the past 240 hour(s))  CULTURE, BLOOD (ROUTINE X 2)     Status: None   Collection Time    12/17/12  7:52 PM      Result Value Range Status   Specimen Description  BLOOD RIGHT ARM   Final   Special Requests BOTTLES DRAWN AEROBIC AND ANAEROBIC 10CC EA   Final   Culture  Setup Time     Final   Value: 12/18/2012 01:29     Performed at [facility]   Culture     Final   Value:        BLOOD CULTURE RECEIVED NO GROWTH TO DATE CULTURE WILL BE HELD FOR 5 DAYS BEFORE ISSUING A FINAL NEGATIVE REPORT     Performed at [facility]   Report Status PENDING   Incomplete  CULTURE, BLOOD (ROUTINE X 2)     Status: None   Collection Time    12/17/12  7:58 PM      Result Value Range Status   Specimen Description BLOOD LEFT ARM   Final   Special Requests BOTTLES DRAWN AEROBIC AND ANAEROBIC 10CC EA   Final   Culture  Setup Time     Final   Value: 12/18/2012 01:29     Performed at [facility]   Culture     Final   Value:        BLOOD CULTURE RECEIVED NO GROWTH TO DATE CULTURE WILL BE HELD FOR 5 DAYS BEFORE ISSUING A FINAL NEGATIVE REPORT     Performed at [facility]   Report Status PENDING   Incomplete  CULTURE, RESPIRATORY (NON-EXPECTORATED)     Status: None   Collection Time    12/17/12  9:02 PM      Result Value Range Status   Specimen Description TRACHEAL ASPIRATE   Final   Special Requests NONE   Final   Gram Stain     Final   Value: RARE WBC PRESENT, PREDOMINANTLY PMN     NO SQUAMOUS EPITHELIAL CELLS SEEN     RARE GRAM POSITIVE RODS     RARE GRAM NEGATIVE RODS     Performed at [facility]   Culture     Final   Value: Non-Pathogenic Oropharyngeal-type Flora Isolated.     Performed at [facility]   Report Status PENDING   Incomplete     Studies: Ct Chest Wo Contrast  12/17/2012   CLINICAL DATA:  Evaluate left lung opacity. The tracheostomy with respiratory failure.  EXAM: CT CHEST WITHOUT CONTRAST  TECHNIQUE: Multidetector CT imaging of the chest was performed following the standard protocol without IV contrast.  COMPARISON:  11/27/2012 chest x-ray.  01/04/2012 chest CT.  FINDINGS: THORACIC INLET/BODY  WALL:  Stable appearance of tracheostomy tube.  MEDIASTINUM:  Cardiomegaly. No pericardial effusion. Extensive coronary artery atherosclerosis. Extensive aortic and great vessel atherosclerosis, without evidence of acute abnormality. No mediastinal lymphadenopathy. Small sliding-type hiatal hernia.  LUNG WINDOWS:  Chronic elevation of the left diaphragm, with chronic bandlike opacities overlying - consistent with scarring. No evidence of consolidation or mass. No effusion or pneumothorax.  UPPER ABDOMEN:  Numerous bilateral low dense  renal masses, partly imaged, are similar in appearance to prior. Bilateral adrenal masses, with the largest on the right measuring approximately 3 cm. Appearance/stability remain consistent with adenomas.  OSSEOUS:  No acute findings.  IMPRESSION: Left lung opacity represents chronic elevation of the left diaphragm with overlying scarring. No evidence of mass or pneumonia.   Electronically Signed   By: Tiburcio Pea M.D.   On: 12/17/2012 23:03    Scheduled Meds: . albuterol  2.5 mg Nebulization QID  . amLODipine  10 mg Oral Daily  . arformoterol  15 mcg Nebulization BID  . aspirin  81 mg Oral Daily  . atorvastatin  40 mg Oral q1800  . budesonide  0.25 mg Nebulization BID  . busPIRone  5 mg Oral BID  . carvedilol  18.75 mg Oral BID WC  . cloNIDine  0.1 mg Oral BID  . clopidogrel  75 mg Oral Q breakfast  . doxycycline  100 mg Oral Q12H  . enoxaparin (LOVENOX) injection  30 mg Subcutaneous Q24H  . ferrous sulfate  325 mg Oral BID WC  . hydrALAZINE  100 mg Oral Q8H  . [START ON 12/20/2012] influenza vac split quadrivalent PF  0.5 mL Intramuscular Tomorrow-1000  . ipratropium  0.5 mg Nebulization QID  . isosorbide mononitrate  30 mg Oral Daily  . metoCLOPramide  5 mg Oral QID  . pantoprazole  40 mg Oral Daily  . potassium chloride  10 mEq Oral BID  . [START ON 12/20/2012] predniSONE  50 mg Oral BID WC  . sertraline  100 mg Oral QHS  . torsemide  60 mg Oral Daily    Continuous Infusions:   Principal Problem:   Acute-on-chronic respiratory failure Active Problems:   Type 2 diabetes mellitus with vascular disease   Hyperlipdemia   C O P D   Anemia   Acute on chronic renal failure   CHF (congestive heart failure)    Time spent: < 30 minutes   Vashon Arch  [facility] Pager [phone]. If 7PM-7AM, please contact night-coverage at [email or web address], password [facility] 12/19/2012, 5:36 PM  LOS: 2 days

## 2012-12-19 NOTE — Progress Notes (Signed)
OT Cancellation Note  Patient Details Name: [name] MRN: [id number] DOB: [birth year]   Cancelled Treatment:     Pt declines OT assessment today despite maximal verbal encouragement. Pt simply declines & shakes head "No", will check back as able, if pt refuses again will consider d/c from acute services.  Roselie Awkward Dixon 12/19/2012, 9:23 AM

## 2012-12-20 DIAGNOSIS — I279 Pulmonary heart disease, unspecified: Secondary | ICD-10-CM

## 2012-12-20 DIAGNOSIS — J449 Chronic obstructive pulmonary disease, unspecified: Secondary | ICD-10-CM

## 2012-12-20 LAB — CBC
HCT: 26.4 % — ABNORMAL LOW (ref 36.0–46.0)
Hemoglobin: 8.6 g/dL — ABNORMAL LOW (ref 12.0–15.0)
MCHC: 32.6 g/dL (ref 30.0–36.0)
MCV: 81 fL (ref 78.0–100.0)
Platelets: 164 10*3/uL (ref 150–400)
RBC: 3.26 MIL/uL — ABNORMAL LOW (ref 3.87–5.11)
RDW: 17.3 % — ABNORMAL HIGH (ref 11.5–15.5)
WBC: 9.3 10*3/uL (ref 4.0–10.5)

## 2012-12-20 LAB — BASIC METABOLIC PANEL
BUN: 63 mg/dL — ABNORMAL HIGH (ref 6–23)
CO2: 24 mEq/L (ref 19–32)
Chloride: 103 mEq/L (ref 96–112)
Creatinine, Ser: 3.64 mg/dL — ABNORMAL HIGH (ref 0.50–1.10)
Potassium: 4.6 mEq/L (ref 3.5–5.1)

## 2012-12-20 LAB — CULTURE, RESPIRATORY W GRAM STAIN

## 2012-12-20 MED ORDER — DOXYCYCLINE HYCLATE 100 MG PO TABS: 100.0000 mg | ORAL_TABLET | Freq: Two times a day (BID) | ORAL | Status: DC

## 2012-12-20 MED ORDER — TORSEMIDE 20 MG PO TABS: 60.0000 mg | ORAL_TABLET | Freq: Every day | ORAL | Status: DC

## 2012-12-20 MED ORDER — PREDNISONE 10 MG PO TABS: ORAL_TABLET | ORAL | Status: DC

## 2012-12-20 MED ORDER — CARVEDILOL 6.25 MG PO TABS: 18.7500 mg | ORAL_TABLET | Freq: Two times a day (BID) | ORAL | Status: DC

## 2012-12-20 NOTE — Progress Notes (Signed)
OT Cancellation Note  Patient Details Name: [name] MRN: [id number] DOB: 10-Feb-1941   Cancelled Treatment:    Reason Eval/Treat Not Completed: Patient declined, no reason specified. Pt states that she is going home today and that she does not need or want to work with therapy. OT explained to pt the importance/benefits of being OOB and participating with therapy, however pt continued to refuse. May consider d/c from acute OT services if pt refuses upon next attempt  Galen Manila 12/20/2012, 11:53 AM

## 2012-12-20 NOTE — Progress Notes (Signed)
IV d/'c'd.  Tele d/c'd.  Pt d/c'd to home.  Home meds and di/c instructions have been reviewed with pt.  Pt denies any questions or concerns.  Pt has been removed from trach collar 02 and placed on 3L/02 via [place] with pt home 02 tank.  Passey Muir valve in place.  02 sats 100% at this time.  Pt leaving unit via wheelchair and appears in no acute distress. Nino Glow RN

## 2012-12-20 NOTE — Progress Notes (Signed)
ATC setup changed 

## 2012-12-20 NOTE — Discharge Summary (Signed)
Physician Discharge Summary  [name] NFA:[id number] DOB: November 01, 1940 DOA: 12/17/2012  PCP: Gwynneth Aliment, MD  Admit date: 12/17/2012 Discharge date: 12/20/2012  Time spent: 40 minutes  Recommendations for Outpatient Follow-up:  1. Follow up with PCP check BP an titrate meds as needed. 2. Follow up with Pulmonary for COPD.  Discharge Diagnoses:  Principal Problem:   Acute-on-chronic respiratory failure Active Problems:   Type 2 diabetes mellitus with vascular disease   Hyperlipdemia   C O P D   Anemia   Acute on chronic renal failure   CHF (congestive heart failure)   Discharge Condition: stable  Diet recommendation: heart healthy  Filed Weights   12/18/12 0424 12/19/12 0505 12/20/12 0444  Weight: 78.5 kg (173 lb 1 oz) 79.062 kg (174 lb 4.8 oz) 80 kg (176 lb 5.9 oz)    History of present illness:  72 yo female with COPD, HT, diastolic CHF, MI who presents to [facility] ED with main concern of progressively worsening shortness of breath that initially started several days prior to this admission, initially present with exertion and now progressed to dyspnea at rest, associated with subjective fevers, chill, malaise, cough productive of yellow sputum. Pt denies known sick contacts or exposures, has been recently hospitalized and discharged in August 2014. She reports no chest pain other than when present with coughing spells, no noticeable worsening LE edema, no noticeable weight gain, no specific abdominal or urinary concerns.    Hospital Course:  Acute-on-chronic respiratory failure  - this appears to be secondary to bronchitis/bronchiectasis and COPD exacerbation  - CT chest r/o PNA  - will continue narrowed antibiotics for bronchitis with atypical coverage as part of tx for COPD exacerbation (doxycycline)  -continue nebs, pulmicort and transition to PO prednisone and slow tapering; watch for rebound bronchoconstriction.  C O P D  -treatment with BD's scheduled and  as needed  -Transition to oral prednisone and start slow tapering continue oxygen, empiric ABX as noted above  -added pulmicort (consider discharge home on it for better control; especially nebulizer treatment)   Acute on chronic renal failure  -last Cr in 10/2012 was 2.58 (isolated value), but her baseline is around 2.8-2.9 -will monitor closely as pt on Torsemide  -encouraged PO intake and repeat BMP in AM   Anemia  - of chronic disease  - no signs of active bleed  - continue Iron as per home medication regimen   CHF (congestive heart failure), diastolic  -pt appears euvolemic and with heart failure well compensated (BNP at baseline essentially)  -continue Torsemide and B-blockers  - strict I's and O's, renal function monitoring   Type 2 diabetes mellitus with vascular disease  -A1C 4.2  -continue SSI while inpatient on steroids; but maintenance treatment as an outpatient is not needed  -continue diet control at discharge   Hyperlipdemia  -lipid panel WNL   HTN:  -stable and well controlled. Continue current regimen   Depression  -continue sertraline   Procedures:  CT chest  Consultations:  none  Discharge Exam: Filed Vitals:   12/20/12 1107  BP: 152/64  Pulse: 65  Temp:   Resp:     General: A&O x3 Cardiovascular: RRR Respiratory: good air movement CTA B/L  Discharge Instructions  Discharge Orders   Future Appointments Provider Department Dept Phone   12/21/2012 2:00 PM [facility] [phone]   01/22/2013 3:30 PM Barbaraann Share, MD [facility] [phone]   Future Orders  Complete By Expires   Diet - low sodium heart healthy  As directed    Increase activity slowly  As directed        Medication List         albuterol (2.5 MG/3ML) 0.083% nebulizer solution  Commonly known as:  PROVENTIL  Take 2.5 mg by nebulization every 6 (six) hours as needed for wheezing.     amLODipine  10 MG tablet  Commonly known as:  NORVASC  Take 10 mg by mouth daily.     arformoterol 15 MCG/2ML Nebu  Commonly known as:  BROVANA  Take 2 mLs (15 mcg total) by nebulization 2 (two) times daily.     aspirin 81 MG chewable tablet  Chew 1 tablet (81 mg total) by mouth daily.     atorvastatin 40 MG tablet  Commonly known as:  LIPITOR  Take 1 tablet (40 mg total) by mouth daily.     BIOTENE MOISTURIZING MOUTH Soln  Use as directed 1 spray in the mouth or throat daily.     budesonide 0.25 MG/2ML nebulizer solution  Commonly known as:  PULMICORT  Take 2 mLs (0.25 mg total) by nebulization 2 (two) times daily.     busPIRone 5 MG tablet  Commonly known as:  BUSPAR  Take 5 mg by mouth 2 (two) times daily.     carvedilol 6.25 MG tablet  Commonly known as:  COREG  Take 3 tablets (18.75 mg total) by mouth 2 (two) times daily with a meal.     chlorhexidine 0.12 % solution  Commonly known as:  PERIDEX  Use as directed 15 mLs in the mouth or throat 4 (four) times daily.     cloNIDine 0.2 MG tablet  Commonly known as:  CATAPRES  Take 0.5 tablets (0.1 mg total) by mouth 2 (two) times daily.     doxycycline 100 MG tablet  Commonly known as:  VIBRA-TABS  Take 1 tablet (100 mg total) by mouth every 12 (twelve) hours.     ferrous sulfate 325 (65 FE) MG EC tablet  Take 1 tablet (325 mg total) by mouth 2 (two) times daily.     hydrALAZINE 100 MG tablet  Commonly known as:  APRESOLINE  Take 100 mg by mouth 3 (three) times daily.     ipratropium 0.02 % nebulizer solution  Commonly known as:  ATROVENT  Take 2.5 mLs (0.5 mg total) by nebulization 4 (four) times daily.     isosorbide mononitrate 30 MG 24 hr tablet  Commonly known as:  IMDUR  Take 1 tablet (30 mg total) by mouth daily.     LORazepam 0.5 MG tablet  Commonly known as:  ATIVAN  Take 1 tablet (0.5 mg total) by mouth every 6 (six) hours as needed for anxiety.     metoCLOPramide 5 MG tablet  Commonly known as:  REGLAN   Take 5 mg by mouth every 6 (six) hours.     multivitamin with minerals Tabs tablet  Take 1 tablet by mouth daily.     oxycodone 5 MG capsule  Commonly known as:  OXY-IR  Take 5 mg by mouth every 6 (six) hours as needed (for moderate pain).     pantoprazole 20 MG tablet  Commonly known as:  PROTONIX  Take 40 mg by mouth daily.     potassium chloride 10 MEQ tablet  Commonly known as:  K-DUR,KLOR-CON  Take 10 mEq by mouth daily.     predniSONE 10 MG tablet  Commonly known as:  DELTASONE  Takes 6 tablets for 2 days, then 5 tablets for 2 days, then 4 tablets for 2 days, then 3 tablets for 2 days, then 2 tabs for 2 days, then 1 tab for 2 days, and then stop.     torsemide 20 MG tablet  Commonly known as:  DEMADEX  Take 3 tablets (60 mg total) by mouth daily. Please file dose change patient has medication     vitamin C 500 MG tablet  Commonly known as:  ASCORBIC ACID  Take 500 mg by mouth daily.       Allergies  Allergen Reactions  . Other Itching    "Wool"  . Sulfonamide Derivatives Hives and Rash       Follow-up Information   Follow up with Gwynneth Aliment, MD In 1 week. (hospital follow up.)    Specialty:  Internal Medicine   Contact information:   [address] [place] [address] [phone]       Follow up with [facility] In 1 week. (hospital follow up for COPD)    Specialty:  Pulmonology   Contact information:   [address] [place] [address] [phone]       The results of significant diagnostics from this hospitalization (including imaging, microbiology, ancillary and laboratory) are listed below for reference.    Significant Diagnostic Studies: Ct Chest Wo Contrast  12/17/2012   CLINICAL DATA:  Evaluate left lung opacity. The tracheostomy with respiratory failure.  EXAM: CT CHEST WITHOUT CONTRAST  TECHNIQUE: Multidetector CT imaging of the chest was performed following the standard protocol without IV contrast.   COMPARISON:  11/27/2012 chest x-ray.  01/04/2012 chest CT.  FINDINGS: THORACIC INLET/BODY WALL:  Stable appearance of tracheostomy tube.  MEDIASTINUM:  Cardiomegaly. No pericardial effusion. Extensive coronary artery atherosclerosis. Extensive aortic and great vessel atherosclerosis, without evidence of acute abnormality. No mediastinal lymphadenopathy. Small sliding-type hiatal hernia.  LUNG WINDOWS:  Chronic elevation of the left diaphragm, with chronic bandlike opacities overlying - consistent with scarring. No evidence of consolidation or mass. No effusion or pneumothorax.  UPPER ABDOMEN:  Numerous bilateral low dense renal masses, partly imaged, are similar in appearance to prior. Bilateral adrenal masses, with the largest on the right measuring approximately 3 cm. Appearance/stability remain consistent with adenomas.  OSSEOUS:  No acute findings.  IMPRESSION: Left lung opacity represents chronic elevation of the left diaphragm with overlying scarring. No evidence of mass or pneumonia.   Electronically Signed   By: Tiburcio Pea M.D.   On: 12/17/2012 23:03   Dg Chest Portable 1 View  12/17/2012   CLINICAL DATA:  Chest pain and shortness of breath  EXAM: PORTABLE CHEST - 1 VIEW  COMPARISON:  October 09, 2012  FINDINGS: There is persistent consolidation in the left lower lobe. There is atelectatic change in the left mid lung. The right lung is clear. Heart is borderline prominent with normal pulmonary vascularity.  Tracheostomy is well seated. No pneumothorax. No adenopathy. No bone lesions.  IMPRESSION: Persistent consolidation in the left lower lobe. Given the chronicity of this finding, it may be prudent to correlate with chest CT or bronchoscopy to further assess. No new opacity is seen. Heart is prominent but stable.   Electronically Signed   By: Bretta Bang M.D.   On: 12/17/2012 15:27    Microbiology: Recent Results (from the past 240 hour(s))  CULTURE, BLOOD (ROUTINE X 2)     Status: None  Collection Time    12/17/12  7:52 PM      Result Value Range Status   Specimen Description BLOOD RIGHT ARM   Final   Special Requests BOTTLES DRAWN AEROBIC AND ANAEROBIC 10CC EA   Final   Culture  Setup Time     Final   Value: 12/18/2012 01:29     Performed at [facility]   Culture     Final   Value:        BLOOD CULTURE RECEIVED NO GROWTH TO DATE CULTURE WILL BE HELD FOR 5 DAYS BEFORE ISSUING A FINAL NEGATIVE REPORT     Performed at [facility]   Report Status PENDING   Incomplete  CULTURE, BLOOD (ROUTINE X 2)     Status: None   Collection Time    12/17/12  7:58 PM      Result Value Range Status   Specimen Description BLOOD LEFT ARM   Final   Special Requests BOTTLES DRAWN AEROBIC AND ANAEROBIC 10CC EA   Final   Culture  Setup Time     Final   Value: 12/18/2012 01:29     Performed at [facility]   Culture     Final   Value:        BLOOD CULTURE RECEIVED NO GROWTH TO DATE CULTURE WILL BE HELD FOR 5 DAYS BEFORE ISSUING A FINAL NEGATIVE REPORT     Performed at [facility]   Report Status PENDING   Incomplete  CULTURE, RESPIRATORY (NON-EXPECTORATED)     Status: None   Collection Time    12/17/12  9:02 PM      Result Value Range Status   Specimen Description TRACHEAL ASPIRATE   Final   Special Requests NONE   Final   Gram Stain     Final   Value: RARE WBC PRESENT, PREDOMINANTLY PMN     NO SQUAMOUS EPITHELIAL CELLS SEEN     RARE GRAM POSITIVE RODS     RARE GRAM NEGATIVE RODS     Performed at [facility]   Culture     Final   Value: Non-Pathogenic Oropharyngeal-type Flora Isolated.     Performed at [facility]   Report Status 12/20/2012 FINAL   Final     Labs: Basic Metabolic Panel:  Recent Labs Lab 12/17/12 1403 12/18/12 0225 12/20/12 0555  NA 143 139 137  K 3.6 4.1 4.6  CL 106 104 103  CO2 26 24 24   GLUCOSE 97 184* 112*  BUN 41* 43* 63*  CREATININE 2.89* 2.88* 3.64*  CALCIUM 9.5 9.0 8.7   Liver Function  Tests:  Recent Labs Lab 12/17/12 1403  AST 12  ALT 6  ALKPHOS 74  BILITOT 0.3  PROT 6.3  ALBUMIN 3.2*   No results found for this basename: LIPASE, AMYLASE,  in the last 168 hours No results found for this basename: AMMONIA,  in the last 168 hours CBC:  Recent Labs Lab 12/17/12 1403 12/18/12 0225 12/20/12 0555  WBC 5.9 6.2 9.3  HGB 9.2* 9.0* 8.6*  HCT 28.6* 27.6* 26.4*  MCV 81.3 80.9 81.0  PLT 152 150 164   Cardiac Enzymes:  Recent Labs Lab 12/18/12 0225 12/18/12 0659 12/18/12 1355  TROPONINI <0.30 <0.30 <0.30   BNP: BNP (last 3 results)  Recent Labs  10/11/12 0500 11/03/12 1021 12/17/12 1952  PROBNP 2714.0* 1855.0* 1973.0*   CBG: No results found for this basename: GLUCAP,  in the last 168 hours  Signed:  Marinda Elk  [facility] 12/20/2012, 12:28 PM

## 2012-12-20 NOTE — Care Management Note (Signed)
    Page 1 of 1   12/20/2012     2:53:07 PM   CARE MANAGEMENT NOTE 12/20/2012  Patient:  [name], [name]   Account Number:  [id number]  Date Initiated:  12/20/2012  Documentation initiated by:  Oumou Smead  Subjective/Objective Assessment:     Action/Plan:   Anticipated DC Date:  12/20/2012   Anticipated DC Plan:  HOME W HOME HEALTH SERVICES         Choice offered to / List presented to:  C-1 Patient        HH arranged  HH-1 RN      [facility] agency  [facility]   Status of service:   Medicare Important Message given?   (If response is "NO", the following Medicare IM given date fields will be blank) Date Medicare IM given:   Date Additional Medicare IM given:    Discharge Disposition:    Per UR Regulation:    If discussed at Long Length of Stay Meetings, dates discussed:    Comments:  Spoke with patient regarding resumption of Home Health with [facility].  [facility] notified the patient will be discharged today and needs a BMET drawn on Friday and results called to her PCP.   Lourdes Sledge RN Case Manager

## 2012-12-20 NOTE — Progress Notes (Signed)
PT Cancellation Note  Patient Details Name: [name] MRN: [id number] DOB: 09/10/1940   Cancelled Treatment:    Reason Eval/Treat Not Completed: Fatigue/lethargy limiting ability to participate.  Pt states "I don't want to do anything that will get me out of breath".  PT attempted to explain to pt importance of exercise, offering toileting, supine therex, seated EOB activities, pt continues to refuse.  She states "I don't need help, I'm going home".   DONAWERTH,KAREN 12/20/2012, 9:02 AM

## 2012-12-21 ENCOUNTER — Encounter (HOSPITAL_COMMUNITY): Payer: Self-pay

## 2012-12-22 ENCOUNTER — Telehealth (HOSPITAL_COMMUNITY): Payer: Self-pay | Admitting: Cardiology

## 2012-12-22 ENCOUNTER — Encounter (HOSPITAL_COMMUNITY): Payer: Self-pay | Admitting: Cardiology

## 2012-12-22 NOTE — Telephone Encounter (Signed)
Message copied by Nathin Saran, Milagros Reap on Fri Dec 22, 2012  2:58 PM ------      Message from: Noralee Space      Created: Fri Dec 22, 2012 12:53 PM       Hey can you sch her for f/u appt please, it can just be first available, with NP is fine, thanks ------

## 2012-12-22 NOTE — Telephone Encounter (Signed)
I have been unable to reach this patient by phone.  A letter is being sent to the last known home address.

## 2012-12-24 LAB — CULTURE, BLOOD (ROUTINE X 2): Culture: NO GROWTH

## 2012-12-27 ENCOUNTER — Institutional Professional Consult (permissible substitution): Payer: Self-pay | Admitting: Internal Medicine

## 2012-12-28 ENCOUNTER — Encounter: Payer: Self-pay | Admitting: Internal Medicine

## 2012-12-31 ENCOUNTER — Encounter (HOSPITAL_COMMUNITY): Payer: Self-pay | Admitting: Emergency Medicine

## 2012-12-31 ENCOUNTER — Emergency Department (HOSPITAL_COMMUNITY): Payer: Medicare Other

## 2012-12-31 ENCOUNTER — Inpatient Hospital Stay (HOSPITAL_COMMUNITY)
Admission: EM | Admit: 2012-12-31 | Discharge: 2013-01-06 | DRG: 291 | Disposition: A | Discharge status: Home / Self Care | Payer: Medicare Other | Attending: Internal Medicine | Admitting: Internal Medicine

## 2012-12-31 DIAGNOSIS — Z8673 Personal history of transient ischemic attack (TIA), and cerebral infarction without residual deficits: Secondary | ICD-10-CM

## 2012-12-31 DIAGNOSIS — E669 Obesity, unspecified: Secondary | ICD-10-CM | POA: Diagnosis present

## 2012-12-31 DIAGNOSIS — J9500 Unspecified tracheostomy complication: Secondary | ICD-10-CM

## 2012-12-31 DIAGNOSIS — I798 Other disorders of arteries, arterioles and capillaries in diseases classified elsewhere: Secondary | ICD-10-CM

## 2012-12-31 DIAGNOSIS — Z87891 Personal history of nicotine dependence: Secondary | ICD-10-CM

## 2012-12-31 DIAGNOSIS — J449 Chronic obstructive pulmonary disease, unspecified: Secondary | ICD-10-CM

## 2012-12-31 DIAGNOSIS — E662 Morbid (severe) obesity with alveolar hypoventilation: Secondary | ICD-10-CM | POA: Diagnosis present

## 2012-12-31 DIAGNOSIS — I252 Old myocardial infarction: Secondary | ICD-10-CM

## 2012-12-31 DIAGNOSIS — Z9119 Patient's noncompliance with other medical treatment and regimen: Secondary | ICD-10-CM

## 2012-12-31 DIAGNOSIS — Z7982 Long term (current) use of aspirin: Secondary | ICD-10-CM

## 2012-12-31 DIAGNOSIS — D638 Anemia in other chronic diseases classified elsewhere: Secondary | ICD-10-CM | POA: Diagnosis present

## 2012-12-31 DIAGNOSIS — K219 Gastro-esophageal reflux disease without esophagitis: Secondary | ICD-10-CM

## 2012-12-31 DIAGNOSIS — I5023 Acute on chronic systolic (congestive) heart failure: Secondary | ICD-10-CM

## 2012-12-31 DIAGNOSIS — G9340 Encephalopathy, unspecified: Secondary | ICD-10-CM

## 2012-12-31 DIAGNOSIS — I999 Unspecified disorder of circulatory system: Secondary | ICD-10-CM | POA: Diagnosis present

## 2012-12-31 DIAGNOSIS — R0600 Dyspnea, unspecified: Secondary | ICD-10-CM

## 2012-12-31 DIAGNOSIS — K7460 Unspecified cirrhosis of liver: Secondary | ICD-10-CM

## 2012-12-31 DIAGNOSIS — E1159 Type 2 diabetes mellitus with other circulatory complications: Secondary | ICD-10-CM | POA: Diagnosis present

## 2012-12-31 DIAGNOSIS — J9610 Chronic respiratory failure, unspecified whether with hypoxia or hypercapnia: Secondary | ICD-10-CM | POA: Diagnosis present

## 2012-12-31 DIAGNOSIS — I5032 Chronic diastolic (congestive) heart failure: Secondary | ICD-10-CM

## 2012-12-31 DIAGNOSIS — Z930 Tracheostomy status: Secondary | ICD-10-CM

## 2012-12-31 DIAGNOSIS — I130 Hypertensive heart and chronic kidney disease with heart failure and stage 1 through stage 4 chronic kidney disease, or unspecified chronic kidney disease: Secondary | ICD-10-CM | POA: Diagnosis present

## 2012-12-31 DIAGNOSIS — I452 Bifascicular block: Secondary | ICD-10-CM | POA: Diagnosis present

## 2012-12-31 DIAGNOSIS — J4489 Other specified chronic obstructive pulmonary disease: Secondary | ICD-10-CM | POA: Diagnosis present

## 2012-12-31 DIAGNOSIS — E119 Type 2 diabetes mellitus without complications: Secondary | ICD-10-CM

## 2012-12-31 DIAGNOSIS — T380X5A Adverse effect of glucocorticoids and synthetic analogues, initial encounter: Secondary | ICD-10-CM | POA: Diagnosis not present

## 2012-12-31 DIAGNOSIS — I5043 Acute on chronic combined systolic (congestive) and diastolic (congestive) heart failure: Principal | ICD-10-CM | POA: Diagnosis present

## 2012-12-31 DIAGNOSIS — D649 Anemia, unspecified: Secondary | ICD-10-CM | POA: Diagnosis present

## 2012-12-31 DIAGNOSIS — Z6828 Body mass index (BMI) 28.0-28.9, adult: Secondary | ICD-10-CM

## 2012-12-31 DIAGNOSIS — N179 Acute kidney failure, unspecified: Secondary | ICD-10-CM

## 2012-12-31 DIAGNOSIS — J45909 Unspecified asthma, uncomplicated: Secondary | ICD-10-CM

## 2012-12-31 DIAGNOSIS — I2510 Atherosclerotic heart disease of native coronary artery without angina pectoris: Secondary | ICD-10-CM

## 2012-12-31 DIAGNOSIS — I279 Pulmonary heart disease, unspecified: Secondary | ICD-10-CM | POA: Diagnosis present

## 2012-12-31 DIAGNOSIS — R531 Weakness: Secondary | ICD-10-CM

## 2012-12-31 DIAGNOSIS — J9620 Acute and chronic respiratory failure, unspecified whether with hypoxia or hypercapnia: Secondary | ICD-10-CM | POA: Diagnosis present

## 2012-12-31 DIAGNOSIS — I110 Hypertensive heart disease with heart failure: Secondary | ICD-10-CM

## 2012-12-31 DIAGNOSIS — F17200 Nicotine dependence, unspecified, uncomplicated: Secondary | ICD-10-CM

## 2012-12-31 DIAGNOSIS — E1169 Type 2 diabetes mellitus with other specified complication: Secondary | ICD-10-CM | POA: Diagnosis present

## 2012-12-31 DIAGNOSIS — M109 Gout, unspecified: Secondary | ICD-10-CM

## 2012-12-31 DIAGNOSIS — N184 Chronic kidney disease, stage 4 (severe): Secondary | ICD-10-CM | POA: Diagnosis present

## 2012-12-31 DIAGNOSIS — D696 Thrombocytopenia, unspecified: Secondary | ICD-10-CM | POA: Diagnosis present

## 2012-12-31 DIAGNOSIS — Z91199 Patient's noncompliance with other medical treatment and regimen due to unspecified reason: Secondary | ICD-10-CM

## 2012-12-31 DIAGNOSIS — E785 Hyperlipidemia, unspecified: Secondary | ICD-10-CM | POA: Diagnosis present

## 2012-12-31 DIAGNOSIS — Z515 Encounter for palliative care: Secondary | ICD-10-CM

## 2012-12-31 DIAGNOSIS — I214 Non-ST elevation (NSTEMI) myocardial infarction: Secondary | ICD-10-CM

## 2012-12-31 DIAGNOSIS — J189 Pneumonia, unspecified organism: Secondary | ICD-10-CM

## 2012-12-31 DIAGNOSIS — D72829 Elevated white blood cell count, unspecified: Secondary | ICD-10-CM | POA: Diagnosis not present

## 2012-12-31 DIAGNOSIS — G4733 Obstructive sleep apnea (adult) (pediatric): Secondary | ICD-10-CM | POA: Diagnosis present

## 2012-12-31 DIAGNOSIS — I2789 Other specified pulmonary heart diseases: Secondary | ICD-10-CM

## 2012-12-31 DIAGNOSIS — I509 Heart failure, unspecified: Secondary | ICD-10-CM

## 2012-12-31 LAB — CBC WITH DIFFERENTIAL/PLATELET
Basophils Absolute: 0 10*3/uL (ref 0.0–0.1)
Basophils Relative: 0 % (ref 0–1)
Eosinophils Absolute: 0 10*3/uL (ref 0.0–0.7)
Eosinophils Relative: 0 % (ref 0–5)
Hemoglobin: 8.8 g/dL — ABNORMAL LOW (ref 12.0–15.0)
Lymphocytes Relative: 15 % (ref 12–46)
Lymphs Abs: 1.7 10*3/uL (ref 0.7–4.0)
MCH: 26.6 pg (ref 26.0–34.0)
MCHC: 31.9 g/dL (ref 30.0–36.0)
MCV: 83.4 fL (ref 78.0–100.0)
Monocytes Relative: 9 % (ref 3–12)
Neutrophils Relative %: 75 % (ref 43–77)
WBC: 11.4 10*3/uL — ABNORMAL HIGH (ref 4.0–10.5)

## 2012-12-31 LAB — BASIC METABOLIC PANEL
CO2: 25 mEq/L (ref 19–32)
Calcium: 9.2 mg/dL (ref 8.4–10.5)
Creatinine, Ser: 2.8 mg/dL — ABNORMAL HIGH (ref 0.50–1.10)
GFR calc non Af Amer: 16 mL/min — ABNORMAL LOW (ref >90)
Potassium: 4.5 mEq/L (ref 3.5–5.1)

## 2012-12-31 LAB — URINE MICROSCOPIC-ADD ON

## 2012-12-31 LAB — URINALYSIS, ROUTINE W REFLEX MICROSCOPIC
Bilirubin Urine: NEGATIVE
Hgb urine dipstick: NEGATIVE
Ketones, ur: NEGATIVE mg/dL
Nitrite: NEGATIVE
Protein, ur: 100 mg/dL — AB
Urobilinogen, UA: 0.2 mg/dL (ref 0.0–1.0)

## 2012-12-31 LAB — TROPONIN I: Troponin I: <0.3 ng/mL (ref <0.30)

## 2012-12-31 LAB — MRSA PCR SCREENING: MRSA by PCR: NEGATIVE

## 2012-12-31 LAB — PRO B NATRIURETIC PEPTIDE: Pro B Natriuretic peptide (BNP): 3189 pg/mL — ABNORMAL HIGH (ref 0–125)

## 2012-12-31 LAB — GLUCOSE, CAPILLARY: Glucose-Capillary: 96 mg/dL (ref 70–99)

## 2012-12-31 MED ORDER — SODIUM CHLORIDE 0.9 % IJ SOLN
3.0000 mL | Freq: Two times a day (BID) | INTRAMUSCULAR | Status: DC
  Administered 2012-12-31: 3 mL via INTRAVENOUS
  Administered 2013-01-01: 11:00:00 via INTRAVENOUS
  Administered 2013-01-01 – 2013-01-05 (×7): 3 mL via INTRAVENOUS

## 2012-12-31 MED ORDER — ALBUTEROL SULFATE (5 MG/ML) 0.5% IN NEBU
2.5000 mg | INHALATION_SOLUTION | Freq: Four times a day (QID) | RESPIRATORY_TRACT | Status: DC | PRN
  Administered 2013-01-06: 2.5 mg via RESPIRATORY_TRACT
  Filled 2012-12-31: qty 0.5

## 2012-12-31 MED ORDER — CARVEDILOL 6.25 MG PO TABS
18.7500 mg | ORAL_TABLET | Freq: Two times a day (BID) | ORAL | Status: DC
  Administered 2013-01-01 – 2013-01-06 (×11): 18.75 mg via ORAL
  Filled 2012-12-31 (×15): qty 1

## 2012-12-31 MED ORDER — FERROUS SULFATE 325 (65 FE) MG PO TBEC
325.0000 mg | DELAYED_RELEASE_TABLET | Freq: Two times a day (BID) | ORAL | Status: DC
  Filled 2012-12-31: qty 1

## 2012-12-31 MED ORDER — FERROUS SULFATE 325 (65 FE) MG PO TABS
325.0000 mg | ORAL_TABLET | Freq: Two times a day (BID) | ORAL | Status: DC
  Administered 2013-01-01 – 2013-01-06 (×10): 325 mg via ORAL
  Filled 2012-12-31 (×12): qty 1

## 2012-12-31 MED ORDER — ATORVASTATIN CALCIUM 40 MG PO TABS
40.0000 mg | ORAL_TABLET | Freq: Every day | ORAL | Status: DC
  Administered 2012-12-31 – 2013-01-06 (×7): 40 mg via ORAL
  Filled 2012-12-31 (×7): qty 1

## 2012-12-31 MED ORDER — INSULIN ASPART 100 UNIT/ML ~~LOC~~ SOLN
0.0000 [IU] | Freq: Three times a day (TID) | SUBCUTANEOUS | Status: DC
  Administered 2013-01-01 – 2013-01-05 (×10): 1 [IU] via SUBCUTANEOUS

## 2012-12-31 MED ORDER — DOXYCYCLINE HYCLATE 100 MG PO TABS
100.0000 mg | ORAL_TABLET | Freq: Two times a day (BID) | ORAL | Status: DC
  Administered 2012-12-31 – 2013-01-06 (×12): 100 mg via ORAL
  Filled 2012-12-31 (×13): qty 1

## 2012-12-31 MED ORDER — IPRATROPIUM BROMIDE 0.02 % IN SOLN
0.5000 mg | Freq: Four times a day (QID) | RESPIRATORY_TRACT | Status: DC
Start: 2012-12-31 — End: 2013-01-06
  Administered 2012-12-31 – 2013-01-06 (×22): 0.5 mg via RESPIRATORY_TRACT
  Filled 2012-12-31 (×22): qty 2.5

## 2012-12-31 MED ORDER — ACETAMINOPHEN 325 MG PO TABS
650.0000 mg | ORAL_TABLET | ORAL | Status: DC | PRN
  Administered 2013-01-01 – 2013-01-03 (×2): 650 mg via ORAL
  Filled 2012-12-31 (×3): qty 2

## 2012-12-31 MED ORDER — BUSPIRONE HCL 5 MG PO TABS
5.0000 mg | ORAL_TABLET | Freq: Two times a day (BID) | ORAL | Status: DC
  Administered 2012-12-31 – 2013-01-06 (×12): 5 mg via ORAL
  Filled 2012-12-31 (×13): qty 1

## 2012-12-31 MED ORDER — ISOSORBIDE MONONITRATE ER 30 MG PO TB24
30.0000 mg | ORAL_TABLET | Freq: Every day | ORAL | Status: DC
  Administered 2012-12-31 – 2013-01-02 (×3): 30 mg via ORAL
  Filled 2012-12-31 (×3): qty 1

## 2012-12-31 MED ORDER — OXYCODONE HCL 5 MG PO TABS
5.0000 mg | ORAL_TABLET | Freq: Four times a day (QID) | ORAL | Status: DC | PRN
  Administered 2013-01-01 – 2013-01-04 (×3): 5 mg via ORAL
  Filled 2012-12-31 (×3): qty 1

## 2012-12-31 MED ORDER — AMLODIPINE BESYLATE 10 MG PO TABS
10.0000 mg | ORAL_TABLET | Freq: Every day | ORAL | Status: DC
  Administered 2012-12-31 – 2013-01-06 (×7): 10 mg via ORAL
  Filled 2012-12-31 (×8): qty 1

## 2012-12-31 MED ORDER — PANTOPRAZOLE SODIUM 40 MG PO TBEC
40.0000 mg | DELAYED_RELEASE_TABLET | Freq: Every day | ORAL | Status: DC
  Administered 2012-12-31 – 2013-01-06 (×7): 40 mg via ORAL
  Filled 2012-12-31 (×7): qty 1

## 2012-12-31 MED ORDER — CLONIDINE HCL 0.1 MG PO TABS
0.1000 mg | ORAL_TABLET | Freq: Two times a day (BID) | ORAL | Status: DC
  Administered 2012-12-31 – 2013-01-02 (×4): 0.1 mg via ORAL
  Filled 2012-12-31 (×5): qty 1

## 2012-12-31 MED ORDER — SODIUM CHLORIDE 0.9 % IV SOLN: 250.0000 mL | INTRAVENOUS | Status: DC | PRN

## 2012-12-31 MED ORDER — HYDRALAZINE HCL 50 MG PO TABS
100.0000 mg | ORAL_TABLET | Freq: Three times a day (TID) | ORAL | Status: DC
  Administered 2012-12-31 – 2013-01-06 (×17): 100 mg via ORAL
  Filled 2012-12-31 (×22): qty 2

## 2012-12-31 MED ORDER — ARFORMOTEROL TARTRATE 15 MCG/2ML IN NEBU
15.0000 ug | INHALATION_SOLUTION | Freq: Two times a day (BID) | RESPIRATORY_TRACT | Status: DC
Start: 2012-12-31 — End: 2013-01-06
  Administered 2012-12-31 – 2013-01-06 (×11): 15 ug via RESPIRATORY_TRACT
  Filled 2012-12-31 (×15): qty 2

## 2012-12-31 MED ORDER — ONDANSETRON HCL 4 MG/2ML IJ SOLN: 4.0000 mg | Freq: Four times a day (QID) | INTRAMUSCULAR | Status: DC | PRN

## 2012-12-31 MED ORDER — BUDESONIDE 0.25 MG/2ML IN SUSP
0.2500 mg | Freq: Two times a day (BID) | RESPIRATORY_TRACT | Status: DC
  Administered 2012-12-31 – 2013-01-06 (×11): 0.25 mg via RESPIRATORY_TRACT
  Filled 2012-12-31 (×15): qty 2

## 2012-12-31 MED ORDER — ASPIRIN 81 MG PO CHEW
81.0000 mg | CHEWABLE_TABLET | Freq: Every day | ORAL | Status: DC
  Administered 2012-12-31 – 2013-01-05 (×6): 81 mg via ORAL
  Filled 2012-12-31 (×6): qty 1

## 2012-12-31 MED ORDER — SODIUM CHLORIDE 0.9 % IJ SOLN
3.0000 mL | INTRAMUSCULAR | Status: DC | PRN
  Administered 2013-01-02: 3 mL via INTRAVENOUS

## 2012-12-31 MED ORDER — PREDNISONE 10 MG PO TABS
10.0000 mg | ORAL_TABLET | Freq: Two times a day (BID) | ORAL | Status: DC
  Administered 2013-01-01 – 2013-01-06 (×11): 10 mg via ORAL
  Filled 2012-12-31 (×14): qty 1

## 2012-12-31 MED ORDER — FUROSEMIDE 10 MG/ML IJ SOLN
60.0000 mg | Freq: Two times a day (BID) | INTRAMUSCULAR | Status: DC
  Administered 2012-12-31 – 2013-01-04 (×8): 60 mg via INTRAVENOUS
  Filled 2012-12-31 (×9): qty 6

## 2012-12-31 MED ORDER — HEPARIN SODIUM (PORCINE) 5000 UNIT/ML IJ SOLN
5000.0000 [IU] | Freq: Three times a day (TID) | INTRAMUSCULAR | Status: DC
  Administered 2012-12-31 – 2013-01-06 (×15): 5000 [IU] via SUBCUTANEOUS
  Filled 2012-12-31 (×20): qty 1

## 2012-12-31 NOTE — H&P (Addendum)
[facility] History and Physical   [name] UJW:[id number] DOB: Sep 05, 1940 DOA: 12/31/2012  Referring physician: Dr. Blinda Leatherwood PCP: Gwynneth Aliment, MD  Specialists: none  Chief Complaint: shortness of breath  HPI: [name] is a 72 y.o. female has a past medical history significant for COPD, chronic respiratory failure status post trach, chronic cor pulmonale, hypertension, previous CVA, chronic combined systolic and diastolic heart failure, with multiple hospitalization due to acute decompensation of her heart failure, recently hospitalized and discharged 10 days ago, presents to the emergency room with chief complaint of worsening shortness of breath and cough with sputum production for the past 2-3 days, as well as weight gain of about 8 pounds in the last week. Her presentation during her previous hospital stay with similar, and her shortness for breath was thought to be multifactorial due to acute exacerbation of her heart failure and COPD exacerbation. She was discharged home on a prednisone taper and doxycycline, however it's been more than a week following her discharge up until she was able to actually feel those medications. Her daughters in the room, and she also endorses the patient has a hard time being compliant with her fluid restriction, and she is not sure when whether she is taking her medications accurately. Patient denies any chest pain, denies any fevers at home, she has no abdominal pain nausea or vomiting, she denies any lightheadedness or dizziness. She endorses leg swelling and fluid buildup in her feet and hands. In the ED, labs were pertinent for pertinent for creatinine of 2.8 (which is at her baseline), Mild leukocytosis of 11.4, mild anemia the hemoglobin is 8.8. Her BNP is elevated to 3000, which is worse than her last hospitalization. In the ED she is somewhat tachypneic breathing 30 times a minute.  Review of Systems: as per HPI otherwise  negative.  Past Medical History  Diagnosis Date  . COPD (chronic obstructive pulmonary disease)     on home o2  . CVA (cerebral infarction)     hx  . Hypertensive heart disease with congestive heart failure   . Chronic cor pulmonale   . Chronic kidney disease (CKD), stage IV (severe)   . Hyperlipdemia   . Obesity (BMI 30-39.9)   . OSA (obstructive sleep apnea)     Does not use CPAP  . History of gout 09/07/2011  . Chronic combined systolic and diastolic CHF (congestive heart failure) 7/13    a. With cor pulmonale - EF 45-50%.  . Anemia   . Vascular disease   . Asthma   . Hypertension   . Diabetes mellitus   . GERD (gastroesophageal reflux disease)   . Arthritis   . RBBB   . Thrombocytopenia   . Acute and chronic respiratory failure     a. Trache placed 11/2011 for recurrent intubation (hypercarbic resp failure), post-intubation vocal cord edema, several issues since that time wih trache. b. On home O2 with trache.  . Obesity, unspecified 08/28/2012  . Obesity hypoventilation syndrome   . Lung collapse     a. Chronic LLL collapse.  . Cardiac arrest     a. Asystolic cardiac arrest 02/2012 in setting of a/c respiratory failure (ischemic w/u has been deferred due to worsening CKD).  . NSTEMI (non-ST elevated myocardial infarction)     a. 06/2012 felt type II - troponin peak 1.45 - ischemic eval has been deferred due to CKD. Started on Plavix.  . Pulmonary HTN     a. mild pulm HTN by  [facility] 08/2011, felt due to lung disease. b. PA pressure by echo 06/2012.  Marland Kitchen NSVT (nonsustained ventricular tachycardia)     a. Noted on tele, admission in 06/2012.  Marland Kitchen Cirrhosis     a. Suspected on abd CT 12/2011.   Past Surgical History  Procedure Laterality Date  . Bilateral oophorectomy    . Cardiac catheterization      right heart cath  . Laparoscopic gastrostomy  12/21/2011    Procedure: LAPAROSCOPIC GASTROSTOMY;  Surgeon: Axel Filler, MD;  Location: [facility] OR;  Service: General;   Laterality: N/A;  laparoscopic gastrostomy possible open feeding tube  . Abdominal hysterectomy    . Tracheostomy  11/2011  . Cataracts    . Tracheostomy tube placement  02/18/2012    Procedure: TRACHEOSTOMY;  Surgeon: Serena Colonel, MD;  Location: [facility] OR;  Service: ENT;  Laterality: N/A;  . Tracheostomy revision N/A 09/01/2012    Procedure: TRACHEOSTOMY REVISION;  Surgeon: Serena Colonel, MD;  Location: [facility] OR;  Service: ENT;  Laterality: N/A;   Social History:  reports that she quit smoking about 16 months ago. Her smoking use included Cigarettes. She has a 55 pack-year smoking history. She has never used smokeless tobacco. She reports that she does not drink alcohol or use illicit drugs.  Allergies  Allergen Reactions  . Other Itching    "Wool"  . Sulfonamide Derivatives Hives and Rash    Family History  Problem Relation Age of Onset  . Heart attack Father   . Stroke Mother     CVA  . Coronary artery disease Daughter    Prior to Admission medications   Medication Sig Start Date End Date Taking? Authorizing Provider  albuterol (PROVENTIL) (2.5 MG/3ML) 0.083% nebulizer solution Take 2.5 mg by nebulization every 6 (six) hours as needed for wheezing.   Yes Historical Provider, MD  amLODipine (NORVASC) 10 MG tablet Take 10 mg by mouth daily.   Yes Historical Provider, MD  arformoterol (BROVANA) 15 MCG/2ML NEBU Take 2 mLs (15 mcg total) by nebulization 2 (two) times daily. 07/16/12  Yes Renae Fickle, MD  Artificial Saliva (BIOTENE MOISTURIZING MOUTH) SOLN Use as directed 1 spray in the mouth or throat daily.   Yes Historical Provider, MD  aspirin 81 MG chewable tablet Chew 1 tablet (81 mg total) by mouth daily. 02/19/12  Yes Simonne Martinet, NP  atorvastatin (LIPITOR) 40 MG tablet Take 1 tablet (40 mg total) by mouth daily. 07/16/12  Yes Renae Fickle, MD  budesonide (PULMICORT) 0.25 MG/2ML nebulizer solution Take 2 mLs (0.25 mg total) by nebulization 2 (two) times daily. 07/16/12  Yes  Renae Fickle, MD  busPIRone (BUSPAR) 5 MG tablet Take 5 mg by mouth 2 (two) times daily.   Yes Historical Provider, MD  carvedilol (COREG) 6.25 MG tablet Take 3 tablets (18.75 mg total) by mouth 2 (two) times daily with a meal. 12/20/12  Yes Marinda Elk, MD  chlorhexidine (PERIDEX) 0.12 % solution Use as directed 15 mLs in the mouth or throat 4 (four) times daily.   Yes Historical Provider, MD  cloNIDine (CATAPRES) 0.2 MG tablet Take 0.5 tablets (0.1 mg total) by mouth 2 (two) times daily. 07/16/12  Yes Renae Fickle, MD  doxycycline (VIBRA-TABS) 100 MG tablet Take 1 tablet (100 mg total) by mouth every 12 (twelve) hours. 12/20/12  Yes Marinda Elk, MD  ferrous sulfate 325 (65 FE) MG EC tablet Take 1 tablet (325 mg total) by mouth 2 (two) times daily. 07/16/12  Yes  Renae Fickle, MD  hydrALAZINE (APRESOLINE) 100 MG tablet Take 100 mg by mouth 3 (three) times daily.   Yes Historical Provider, MD  ipratropium (ATROVENT) 0.02 % nebulizer solution Take 2.5 mLs (0.5 mg total) by nebulization 4 (four) times daily. 09/14/12  Yes Christiane Ha, MD  isosorbide mononitrate (IMDUR) 30 MG 24 hr tablet Take 1 tablet (30 mg total) by mouth daily. 05/19/12  Yes Estela Isaiah Blakes, MD  LORazepam (ATIVAN) 0.5 MG tablet Take 1 tablet (0.5 mg total) by mouth every 6 (six) hours as needed for anxiety. 09/14/12  Yes Christiane Ha, MD  metoCLOPramide (REGLAN) 5 MG tablet Take 5 mg by mouth every 6 (six) hours.   Yes Historical Provider, MD  Multiple Vitamin (MULTIVITAMIN WITH MINERALS) TABS tablet Take 1 tablet by mouth daily.   Yes Historical Provider, MD  oxycodone (OXY-IR) 5 MG capsule Take 5 mg by mouth every 6 (six) hours as needed (for moderate pain).   Yes Historical Provider, MD  pantoprazole (PROTONIX) 20 MG tablet Take 40 mg by mouth daily.   Yes Historical Provider, MD  potassium chloride (K-DUR,KLOR-CON) 10 MEQ tablet Take 10 mEq by mouth 2 (two) times daily.    Yes Historical  Provider, MD  predniSONE (DELTASONE) 10 MG tablet Takes 6 tablets for 2 days, then 5 tablets for 2 days, then 4 tablets for 2 days, then 3 tablets for 2 days, then 2 tabs for 2 days, then 1 tab for 2 days, and then stop. 12/20/12  Yes Marinda Elk, MD  torsemide (DEMADEX) 20 MG tablet Take 20 mg by mouth 2 (two) times daily.   Yes Historical Provider, MD  vitamin C (ASCORBIC ACID) 500 MG tablet Take 500 mg by mouth daily.   Yes Historical Provider, MD   Physical Exam: Filed Vitals:   12/31/12 1238 12/31/12 1240 12/31/12 1255 12/31/12 1345  BP:  152/67  153/68  Pulse:  64  63  Temp:  98.6 F (37 C)    TempSrc:  Oral    Resp:  32  19  SpO2: 100% 100% 99% 97%     General:  In mild distress, tachypnea.   Eyes: PERRL, EOMI, no scleral icterus  ENT: moist oropharynx  Neck: supple, +JVD  Cardiovascular: regular rate without MRG; 2+ peripheral pulses  Respiratory: Good air movement, crackles up to mid lung fields bilaterally; trach in place  Abdomen: soft, non tender to palpation, positive bowel sounds, no guarding, no rebound  Skin: no rashes  Musculoskeletal: 1+ edema upper and lower extremities   Psychiatric: normal mood and affect  Neurologic: CN 2-12 grossly intact, MS 5/5 in all 4  Labs on Admission:  Basic Metabolic Panel:  Recent Labs Lab 12/31/12 1255  NA 143  K 4.5  CL 108  CO2 25  GLUCOSE 112*  BUN 57*  CREATININE 2.80*  CALCIUM 9.2   CBC:  Recent Labs Lab 12/31/12 1255  WBC 11.4*  NEUTROABS 8.6*  HGB 8.8*  HCT 27.6*  MCV 83.4  PLT 187   Cardiac Enzymes:  Recent Labs Lab 12/31/12 1255  TROPONINI <0.30    BNP (last 3 results)  Recent Labs  11/03/12 1021 12/17/12 1952 12/31/12 1255  PROBNP 1855.0* 1973.0* 3189.0*   Radiological Exams on Admission: Dg Chest Portable 1 View  12/31/2012   CLINICAL DATA:  Shortness of breath.  EXAM: PORTABLE CHEST - 1 VIEW  COMPARISON:  12/17/2012  FINDINGS: Tracheostomy shows stable  positioning. There is slight worsening of  left lower lobe atelectasis. Mild atelectasis present at the right lung base. No evidence of edema or visible pleural fluid. There is stable cardiomegaly. The visualized skeletal structures are unremarkable.  IMPRESSION: Worsening of left lower lobe atelectasis. Mild atelectasis present at the right lung base.   Electronically Signed   By: Irish Lack M.D.   On: 12/31/2012 13:49    EKG: Independently reviewed.  Assessment/Plan Active Problems:   Type 2 diabetes mellitus with vascular disease   Hyperlipdemia   Obesity (BMI 30-39.9)   Sleep apnea   Hypertensive heart disease with congestive heart failure   CORONARY ARTERY DISEASE   Chronic cor pulmonale   ASTHMA   C O P D   Chronic kidney disease (CKD), stage IV (severe)   Bifascicular block   Anemia   Acute on chronic systolic heart failure   Chronic respiratory failure   Acute-on-chronic respiratory failure   Tracheostomy status   Acute on chronic combined systolic and diastolic CHF (congestive heart failure)   Dyspnea  Acute on chronic combined systolic and diastolic heart failure - Patient with multiple hospitalizations recently. For the compensation is multifactorial likely related to non compliance with her fluid restriction and the possibility of her not taking her medications correctly. We'll start IV Lasix 60 mg every 12 hours. Reassess her LV function with 2-D echo. Last 2-D echo was done about 6 months ago. Consult heart failure team in the morning. Given tachypnea, admit patient to the step down overnight. Strict I.'s and Os. Daily weights. COPD - with recent exacerbation, will continue low-dose prednisone as per previous plans, and continue doxycycline since patient has missed that at home. No wheezing currently. Continue inhalers Acute on chronic respiratory failure - status post trach in the past year. Decompensated due to #1. Closely monitor.  CKD - baseline creatinine 2.5-2.8.  Creatinine on admission 2.8, appears to be at baseline. Monitor. Mild leukocytosis - likely related to steroid use. Hypertension - continue home medications Hyperlipidemia - continue statin Anemia - likely of chronic disease. No evidence of active bleed. Continue iron. Type 2 diabetes - last A1C shows controlled 4.2. Now sliding scale because she is on steroids.  Diet: heart with fluid and salt restriction  Fluids: None  DVT Prophylaxis: Heparin subcutaneous  Code Status: Full code  Family Communication: daughter at bedside  Disposition Plan: Inpatient  Time spent: 7  Rayah Fines M. Elvera Lennox, MD [facility] Pager [phone]  If 7PM-7AM, please contact night-coverage [email or web address] Password [email or web address] 12/31/2012, 3:26 PM

## 2012-12-31 NOTE — Progress Notes (Signed)
Case Manager met patient and daughter at bedside.Role of Case Manager explained.Patient to be admitted.Case Manager explained that Case Manager will provide follow up on the floor. Daughter [name] reports she wants services with [facility] resumed once mother discharged from the hospital.Daughter reports [facility] provided Home health services  For the last week however she would like [facility] to be the Home health provider.Daughter reports she has no further questions.

## 2012-12-31 NOTE — ED Notes (Signed)
Pt is trach pt. Reports sob x 2 night. Denies fevers. Janina Mayo was capped pta and pt placed on 4L [place]. Called resp therapy on pts arrival. spo2 100%, RR 32.

## 2012-12-31 NOTE — ED Notes (Signed)
Admitting md at bedside to eval pt 

## 2012-12-31 NOTE — ED Provider Notes (Signed)
CSN: [id number]     Arrival date & time 12/31/12  1228 History   First MD Initiated Contact with Patient 12/31/12 1232     Chief Complaint  Patient presents with  . Shortness of Breath   (Consider location/radiation/quality/duration/timing/severity/associated sxs/prior Treatment) HPI Comments: Patient comes to the ER for evaluation of shortness of breath. Patient reports that she has been exhibiting progressively worsening shortness of breath for the last 2 days. It is worse at night. She started having a cough yesterday and has progressively worsened. Patient does have a trach. He has been placed for more than a year. She has not had any fever. There is no chest pain.  Patient is a 72 y.o. female presenting with shortness of breath.  Shortness of Breath Associated symptoms: cough   Associated symptoms: no chest pain and no fever     Past Medical History  Diagnosis Date  . COPD (chronic obstructive pulmonary disease)     on home o2  . CVA (cerebral infarction)     hx  . Hypertensive heart disease with congestive heart failure   . Chronic cor pulmonale   . Chronic kidney disease (CKD), stage IV (severe)   . Hyperlipdemia   . Obesity (BMI 30-39.9)   . OSA (obstructive sleep apnea)     Does not use CPAP  . History of gout 09/07/2011  . Chronic combined systolic and diastolic CHF (congestive heart failure) 7/13    a. With cor pulmonale - EF 45-50%.  . Anemia   . Vascular disease   . Asthma   . Hypertension   . Diabetes mellitus   . GERD (gastroesophageal reflux disease)   . Arthritis   . RBBB   . Thrombocytopenia   . Acute and chronic respiratory failure     a. Trache placed 11/2011 for recurrent intubation (hypercarbic resp failure), post-intubation vocal cord edema, several issues since that time wih trache. b. On home O2 with trache.  . Obesity, unspecified 08/28/2012  . Obesity hypoventilation syndrome   . Lung collapse     a. Chronic LLL collapse.  . Cardiac arrest    a. Asystolic cardiac arrest 02/2012 in setting of a/c respiratory failure (ischemic w/u has been deferred due to worsening CKD).  . NSTEMI (non-ST elevated myocardial infarction)     a. 06/2012 felt type II - troponin peak 1.45 - ischemic eval has been deferred due to CKD. Started on Plavix.  . Pulmonary HTN     a. mild pulm HTN by [facility] 08/2011, felt due to lung disease. b. PA pressure by echo 06/2012.  Marland Kitchen NSVT (nonsustained ventricular tachycardia)     a. Noted on tele, admission in 06/2012.  Marland Kitchen Cirrhosis     a. Suspected on abd CT 12/2011.   Past Surgical History  Procedure Laterality Date  . Bilateral oophorectomy    . Cardiac catheterization      right heart cath  . Laparoscopic gastrostomy  12/21/2011    Procedure: LAPAROSCOPIC GASTROSTOMY;  Surgeon: Axel Filler, MD;  Location: [facility] OR;  Service: General;  Laterality: N/A;  laparoscopic gastrostomy possible open feeding tube  . Abdominal hysterectomy    . Tracheostomy  11/2011  . Cataracts    . Tracheostomy tube placement  02/18/2012    Procedure: TRACHEOSTOMY;  Surgeon: Serena Colonel, MD;  Location: [facility] OR;  Service: ENT;  Laterality: N/A;  . Tracheostomy revision N/A 09/01/2012    Procedure: TRACHEOSTOMY REVISION;  Surgeon: Serena Colonel, MD;  Location: [facility] OR;  Service: ENT;  Laterality: N/A;   Family History  Problem Relation Age of Onset  . Heart attack Father   . Stroke Mother     CVA  . Coronary artery disease Daughter    History  Substance Use Topics  . Smoking status: Former Smoker -- 1.00 packs/day for 55 years    Types: Cigarettes    Quit date: 08/28/2011  . Smokeless tobacco: Never Used  . Alcohol Use: No   OB History   Grav Para Term Preterm Abortions TAB SAB Ect Mult Living                 Review of Systems  Constitutional: Positive for unexpected weight change (weight gain). Negative for fever.  Respiratory: Positive for cough and shortness of breath.   Cardiovascular: Positive for leg swelling. Negative  for chest pain.  All other systems reviewed and are negative.    Allergies  Other and Sulfonamide derivatives  Home Medications   Current Outpatient Rx  Name  Route  Sig  Dispense  Refill  . albuterol (PROVENTIL) (2.5 MG/3ML) 0.083% nebulizer solution   Nebulization   Take 2.5 mg by nebulization every 6 (six) hours as needed for wheezing.         Marland Kitchen amLODipine (NORVASC) 10 MG tablet   Oral   Take 10 mg by mouth daily.         Marland Kitchen arformoterol (BROVANA) 15 MCG/2ML NEBU   Nebulization   Take 2 mLs (15 mcg total) by nebulization 2 (two) times daily.   120 mL   1   . Artificial Saliva (BIOTENE MOISTURIZING MOUTH) SOLN   Mouth/Throat   Use as directed 1 spray in the mouth or throat daily.         Marland Kitchen aspirin 81 MG chewable tablet   Oral   Chew 1 tablet (81 mg total) by mouth daily.         Marland Kitchen atorvastatin (LIPITOR) 40 MG tablet   Oral   Take 1 tablet (40 mg total) by mouth daily.   30 tablet   1   . budesonide (PULMICORT) 0.25 MG/2ML nebulizer solution   Nebulization   Take 2 mLs (0.25 mg total) by nebulization 2 (two) times daily.   60 mL   12   . busPIRone (BUSPAR) 5 MG tablet   Oral   Take 5 mg by mouth 2 (two) times daily.         . carvedilol (COREG) 6.25 MG tablet   Oral   Take 3 tablets (18.75 mg total) by mouth 2 (two) times daily with a meal.   30 tablet   0   . chlorhexidine (PERIDEX) 0.12 % solution   Mouth/Throat   Use as directed 15 mLs in the mouth or throat 4 (four) times daily.         . cloNIDine (CATAPRES) 0.2 MG tablet   Oral   Take 0.5 tablets (0.1 mg total) by mouth 2 (two) times daily.   60 tablet   11   . doxycycline (VIBRA-TABS) 100 MG tablet   Oral   Take 1 tablet (100 mg total) by mouth every 12 (twelve) hours.   10 tablet   0   . ferrous sulfate 325 (65 FE) MG EC tablet   Oral   Take 1 tablet (325 mg total) by mouth 2 (two) times daily.   60 tablet   3   . hydrALAZINE (APRESOLINE) 100 MG tablet   Oral  Take  100 mg by mouth 3 (three) times daily.         Marland Kitchen ipratropium (ATROVENT) 0.02 % nebulizer solution   Nebulization   Take 2.5 mLs (0.5 mg total) by nebulization 4 (four) times daily.   75 mL   12   . isosorbide mononitrate (IMDUR) 30 MG 24 hr tablet   Oral   Take 1 tablet (30 mg total) by mouth daily.   30 tablet   1   . LORazepam (ATIVAN) 0.5 MG tablet   Oral   Take 1 tablet (0.5 mg total) by mouth every 6 (six) hours as needed for anxiety.   30 tablet   0   . metoCLOPramide (REGLAN) 5 MG tablet   Oral   Take 5 mg by mouth every 6 (six) hours.         . Multiple Vitamin (MULTIVITAMIN WITH MINERALS) TABS tablet   Oral   Take 1 tablet by mouth daily.         Marland Kitchen oxycodone (OXY-IR) 5 MG capsule   Oral   Take 5 mg by mouth every 6 (six) hours as needed (for moderate pain).         . pantoprazole (PROTONIX) 20 MG tablet   Oral   Take 40 mg by mouth daily.         . potassium chloride (K-DUR,KLOR-CON) 10 MEQ tablet   Oral   Take 10 mEq by mouth daily.         . predniSONE (DELTASONE) 10 MG tablet      Takes 6 tablets for 2 days, then 5 tablets for 2 days, then 4 tablets for 2 days, then 3 tablets for 2 days, then 2 tabs for 2 days, then 1 tab for 2 days, and then stop.   42 tablet   0   . vitamin C (ASCORBIC ACID) 500 MG tablet   Oral   Take 500 mg by mouth daily.          BP 152/67  Pulse 64  Temp(Src) 98.6 F (37 C) (Oral)  Resp 32  SpO2 99% Physical Exam  Constitutional: She is oriented to person, place, and time. She appears well-developed and well-nourished. No distress.  HENT:  Head: Normocephalic and atraumatic.  Right Ear: Hearing normal.  Left Ear: Hearing normal.  Nose: Nose normal.  Mouth/Throat: Oropharynx is clear and moist and mucous membranes are normal.  Eyes: Conjunctivae and EOM are normal. Pupils are equal, round, and reactive to light.  Neck: Normal range of motion. Neck supple.  Cardiovascular: Regular rhythm, S1 normal and  S2 normal.  Exam reveals no gallop and no friction rub.   No murmur heard. Pulmonary/Chest: Accessory muscle usage present. Tachypnea noted. She has decreased breath sounds. She has rales. She exhibits no tenderness.  Abdominal: Soft. Normal appearance and bowel sounds are normal. There is no hepatosplenomegaly. There is no tenderness. There is no rebound, no guarding, no tenderness at McBurney's point and negative Murphy's sign. No hernia.  Musculoskeletal: Normal range of motion.  Neurological: She is alert and oriented to person, place, and time. She has normal strength. No cranial nerve deficit or sensory deficit. Coordination normal. GCS eye subscore is 4. GCS verbal subscore is 5. GCS motor subscore is 6.  Skin: Skin is warm, dry and intact. No rash noted. No cyanosis.  Psychiatric: She has a normal mood and affect. Her speech is normal and behavior is normal. Thought content normal.    ED Course  Procedures (including critical care time) Labs Review Labs Reviewed  CBC WITH DIFFERENTIAL - Abnormal; Notable for the following:    WBC 11.4 (*)    RBC 3.31 (*)    Hemoglobin 8.8 (*)    HCT 27.6 (*)    RDW 17.9 (*)    Neutro Abs 8.6 (*)    All other components within normal limits  BASIC METABOLIC PANEL - Abnormal; Notable for the following:    Glucose, Bld 112 (*)    BUN 57 (*)    Creatinine, Ser 2.80 (*)    GFR calc non Af Amer 16 (*)    GFR calc Af Amer 18 (*)    All other components within normal limits  PRO B NATRIURETIC PEPTIDE - Abnormal; Notable for the following:    Pro B Natriuretic peptide (BNP) 3189.0 (*)    All other components within normal limits  TROPONIN I   Imaging Review Dg Chest Portable 1 View  12/31/2012   CLINICAL DATA:  Shortness of breath.  EXAM: PORTABLE CHEST - 1 VIEW  COMPARISON:  12/17/2012  FINDINGS: Tracheostomy shows stable positioning. There is slight worsening of left lower lobe atelectasis. Mild atelectasis present at the right lung base. No  evidence of edema or visible pleural fluid. There is stable cardiomegaly. The visualized skeletal structures are unremarkable.  IMPRESSION: Worsening of left lower lobe atelectasis. Mild atelectasis present at the right lung base.   Electronically Signed   By: Irish Lack M.D.   On: 12/31/2012 13:49    EKG Interpretation     Ventricular Rate:  64 PR Interval:  177 QRS Duration: 174 QT Interval:  472 QTC Calculation: 487 R Axis:   -31 Text Interpretation:  Age not entered, assumed to be  72 years old for purpose of ECG interpretation Sinus rhythm Left atrial enlargement IVCD, consider atypical RBBB LVH with IVCD and secondary repol abnrm Borderline prolonged QT interval No significant change since last tracing            MDM  Diagnosis: Congestive heart failure  Patient presents to the ER for difficulty breathing. She has had slight nonproductive cough. At arrival, patient does be in mild distress, respiratory rate 31-32. Patient placed on trach mask oxygen and has been oxygenating 100%. She improved slightly after deep suctioning. Workup reveals elevated BNP from her baseline. Based on her weight gain, lower extremity edema, elevated BNP, I do suspect decompensation of congestive heart failure. Patient to be admitted by the hospitalist service.    Gilda Crease, MD 12/31/12 1550

## 2013-01-01 DIAGNOSIS — I5043 Acute on chronic combined systolic (congestive) and diastolic (congestive) heart failure: Principal | ICD-10-CM

## 2013-01-01 LAB — CBC
HCT: 26.5 % — ABNORMAL LOW (ref 36.0–46.0)
Hemoglobin: 8.3 g/dL — ABNORMAL LOW (ref 12.0–15.0)
MCH: 26 pg (ref 26.0–34.0)
MCHC: 31.3 g/dL (ref 30.0–36.0)
RDW: 18 % — ABNORMAL HIGH (ref 11.5–15.5)
WBC: 9.5 10*3/uL (ref 4.0–10.5)

## 2013-01-01 LAB — BASIC METABOLIC PANEL
BUN: 58 mg/dL — ABNORMAL HIGH (ref 6–23)
CO2: 27 mEq/L (ref 19–32)
Creatinine, Ser: 2.74 mg/dL — ABNORMAL HIGH (ref 0.50–1.10)
GFR calc Af Amer: 19 mL/min — ABNORMAL LOW (ref >90)
GFR calc non Af Amer: 16 mL/min — ABNORMAL LOW (ref >90)
Glucose, Bld: 101 mg/dL — ABNORMAL HIGH (ref 70–99)

## 2013-01-01 LAB — GLUCOSE, CAPILLARY
Glucose-Capillary: 98 mg/dL (ref 70–99)
Glucose-Capillary: 137 mg/dL — ABNORMAL HIGH (ref 70–99)
Glucose-Capillary: 183 mg/dL — ABNORMAL HIGH (ref 70–99)

## 2013-01-01 MED ORDER — FUROSEMIDE 10 MG/ML IJ SOLN
INTRAMUSCULAR | Status: AC
  Filled 2013-01-01: qty 8

## 2013-01-01 NOTE — Progress Notes (Signed)
[facility] Progress Note [place] TEAM 1 - Stepdown/ICU TEAM   [name] AVW:[id number] DOB: 02/16/1941 DOA: 12/31/2012 PCP: Gwynneth Aliment, MD  Admit HPI / Brief Narrative: 72 y.o. female w/ a past medical history significant for COPD, chronic respiratory failure status post trach on home O2 at 4L/Min, cor pulmonale, hypertension, previous CVA, chronic combined systolic and diastolic heart failure, with multiple prior hospitalizations due to acute decompensation of her heart failure, recently hospitalized and discharged 10 days ago, who presented to the emergency room with a chief complaint of worsening shortness of breath and cough with sputum production for 2-3 days, as well as weight gain of about 8 pounds over 7 days. She was discharged home on a prednisone taper and doxycycline, however it was more than a week following her discharge before she was able to actually fill those medications. Her daughters endorsed that  the patient has a hard time being compliant with her fluid restriction, and they are not sure whether she was taking her medications accurately. She endorsed leg swelling and fluid buildup in her feet and hands.   In the ED, labs were pertinent for pertinent for creatinine of 2.8 (which her baseline), mild leukocytosis of 11.4, mild anemia (hemoglobin 8.8). Her BNP was elevated to 3000, which is worse than her last hospitalization. In the ED she was somewhat tachypneic breathing 30 times a minute  Assessment/Plan:  Dyspnea/Respiratory failure, acute on chronic - CHF + COPD, OHS/OSA, chronic LLL collapse Multifactorial, with volume overload as primary contributor - see other issues detailed below   Acute on Chronic CHF - systolic + R heart failure / mod pulm HTN  EF 45% with hypokinesis and grade 2 diastolic dysfunction per echo 09/2954 - net negative of -15 liters and a discharge weight of 172 lbs during admit Sept 2014 - followed in CHF clinic - CHF Team to be  consulted if pt does not continue to improve - pt only ~4# overweight at time of admit - diuresed ~2L thus far - cont attempts at diuresis, and if stalls will consult CHF Team   Moderate pulmonary HTN and RV failure (on last echo 06/2012) Volume overloaded at this time - with BP stable/high - follow w/ diuresis   Uncontrolled HTN  Reasonably controlled at present - follow w/ ongoing diuresis   COPD  Well compensated at present / no wheezing on exam   OHS/OSA  Trach dependent   Chronic LLL collapse  Stable via CXR   CKD stage IV  baseline Cr appears to be 2.5-2.8 - No ACE I  Anemia of chronic disease in setting of CKD  Hb 8.3 on admission - no evidence of acute blood loss - follow   DM 2 well controlled at present - no change in tx plan today   GERD   Gout  Well compensated at this time   HLD Cont Lipitor  Code Status: FULL Family Communication: no family present at time of exam Disposition Plan: remain in SDU 24hrs more w/ possible transfer to CHF floor in AM  Consultants: none  Procedures: none  Antibiotics: none  DVT prophylaxis: SQ heparin  HPI/Subjective: The patient indicates that she is feeling much better the time of my exam.  She does not appear to be struggling.  Her respirations are comfortable.  She denies chest pain fevers chills nausea vomiting or abdominal pain.  Objective: Blood pressure 153/55, pulse 66, temperature 98.7 F (37.1 C), temperature source Oral, resp. rate 20, height 5'  4" (1.626 m), weight 79.8 kg (175 lb 14.8 oz), SpO2 96.00%.  Intake/Output Summary (Last 24 hours) at 01/01/13 1543 Last data filed at 01/01/13 1300  Gross per 24 hour  Intake   1078 ml  Output   2900 ml  Net  -1822 ml   Exam: General: No acute respiratory distress at rest in bed  Lungs: mild bibasilar crackles - no appreciable wheeze  Cardiovascular: Regular rate and rhythm without murmur gallop or rub normal S1 and S2 Abdomen: Nontender, nondistended,  soft, bowel sounds positive, no rebound, no ascites, no appreciable mass Extremities: No significant cyanosis, or clubbing;  1+edema bilateral lower extremities  Data Reviewed: Basic Metabolic Panel:  Recent Labs Lab 12/31/12 1255 01/01/13 0420  NA 143 139  K 4.5 4.3  CL 108 103  CO2 25 27  GLUCOSE 112* 101*  BUN 57* 58*  CREATININE 2.80* 2.74*  CALCIUM 9.2 9.0   Liver Function Tests: No results found for this basename: AST, ALT, ALKPHOS, BILITOT, PROT, ALBUMIN,  in the last 168 hours  CBC:  Recent Labs Lab 12/31/12 1255 01/01/13 0420  WBC 11.4* 9.5  NEUTROABS 8.6*  --   HGB 8.8* 8.3*  HCT 27.6* 26.5*  MCV 83.4 83.1  PLT 187 166   Cardiac Enzymes:  Recent Labs Lab 12/31/12 1255  TROPONINI <0.30   BNP (last 3 results)  Recent Labs  11/03/12 1021 12/17/12 1952 12/31/12 1255  PROBNP 1855.0* 1973.0* 3189.0*   CBG:  Recent Labs Lab 12/31/12 1752 12/31/12 2146 01/01/13 0753 01/01/13 1149  GLUCAP 96 145* 98 135*    Recent Results (from the past 240 hour(s))  MRSA PCR SCREENING     Status: None   Collection Time    12/31/12  6:14 PM      Result Value Range Status   MRSA by PCR NEGATIVE  NEGATIVE Final   Comment:            The GeneXpert MRSA Assay (FDA     approved for NASAL specimens     only), is one component of a     comprehensive MRSA colonization     surveillance program. It is not     intended to diagnose MRSA     infection nor to guide or     monitor treatment for     MRSA infections.     Studies:  Recent x-ray studies have been reviewed in detail by the Attending Physician  Scheduled Meds:  Scheduled Meds: . amLODipine  10 mg Oral Daily  . arformoterol  15 mcg Nebulization BID  . aspirin  81 mg Oral Daily  . atorvastatin  40 mg Oral Daily  . budesonide  0.25 mg Nebulization BID  . busPIRone  5 mg Oral BID  . carvedilol  18.75 mg Oral BID WC  . cloNIDine  0.1 mg Oral BID  . doxycycline  100 mg Oral Q12H  . ferrous sulfate   325 mg Oral BID WC  . furosemide  60 mg Intravenous Q12H  . heparin  5,000 Units Subcutaneous Q8H  . hydrALAZINE  100 mg Oral TID  . insulin aspart  0-9 Units Subcutaneous TID WC  . ipratropium  0.5 mg Nebulization QID  . isosorbide mononitrate  30 mg Oral Daily  . pantoprazole  40 mg Oral Daily  . predniSONE  10 mg Oral BID WC  . sodium chloride  3 mL Intravenous Q12H    Time spent on care of this patient: 35 mins  Lonia Blood  [facility] Office  [phone] Pager - Text Page per Loretha Stapler as per below:  On-Call/Text Page:      [email or web address]      password [email or web address]  If 7PM-7AM, please contact night-coverage [email or web address] Password [email or web address] 01/01/2013, 3:43 PM   LOS: 1 day

## 2013-01-01 NOTE — Care Management Note (Signed)
    Page 1 of 1   01/06/2013     11:03:04 AM   CARE MANAGEMENT NOTE 01/06/2013  Patient:  [name], [name]   Account Number:  [id number]  Date Initiated:  01/01/2013  Documentation initiated by:  Junius Creamer  Subjective/Objective Assessment:   adm w diabetes     Action/Plan:   lives w fam, pcp dr Melina Schools sanders, act w [facility]   Anticipated DC Date:  01/06/2013   Anticipated DC Plan:  HOME W HOME HEALTH SERVICES      DC Planning Services  CM consult      [facility] Choice  Resumption Of Svcs/PTA Provider   Choice offered to / List presented to:          [facility] arranged  HH-1 RN  HH-10 DISEASE MANAGEMENT      HH agency  [facility]   Status of service:  Completed, signed off Medicare Important Message given?   (If response is "NO", the following Medicare IM given date fields will be blank) Date Medicare IM given:   Date Additional Medicare IM given:    Discharge Disposition:  HOME W HOME HEALTH SERVICES  Per UR Regulation:  Reviewed for med. necessity/level of care/duration of stay  If discussed at Long Length of Stay Meetings, dates discussed:    Comments:  01/05/13 1400 Oletta Cohn, RN, BSN, [place] [phone] Pt currently active with [facility] for RN services as confirmed with Kizzie Furnish, RN of [facility].  Resumption orders placed.

## 2013-01-02 ENCOUNTER — Inpatient Hospital Stay (HOSPITAL_COMMUNITY): Payer: Medicare Other

## 2013-01-02 DIAGNOSIS — N189 Chronic kidney disease, unspecified: Secondary | ICD-10-CM

## 2013-01-02 DIAGNOSIS — N179 Acute kidney failure, unspecified: Secondary | ICD-10-CM

## 2013-01-02 DIAGNOSIS — J9620 Acute and chronic respiratory failure, unspecified whether with hypoxia or hypercapnia: Secondary | ICD-10-CM

## 2013-01-02 DIAGNOSIS — I509 Heart failure, unspecified: Secondary | ICD-10-CM

## 2013-01-02 LAB — CBC
Hemoglobin: 8.4 g/dL — ABNORMAL LOW (ref 12.0–15.0)
MCV: 81.9 fL (ref 78.0–100.0)
RBC: 3.21 MIL/uL — ABNORMAL LOW (ref 3.87–5.11)

## 2013-01-02 LAB — GLUCOSE, CAPILLARY
Glucose-Capillary: 103 mg/dL — ABNORMAL HIGH (ref 70–99)
Glucose-Capillary: 133 mg/dL — ABNORMAL HIGH (ref 70–99)
Glucose-Capillary: 138 mg/dL — ABNORMAL HIGH (ref 70–99)
Glucose-Capillary: 182 mg/dL — ABNORMAL HIGH (ref 70–99)

## 2013-01-02 LAB — BASIC METABOLIC PANEL
Creatinine, Ser: 3.15 mg/dL — ABNORMAL HIGH (ref 0.50–1.10)
GFR calc Af Amer: 16 mL/min — ABNORMAL LOW (ref >90)
GFR calc non Af Amer: 14 mL/min — ABNORMAL LOW (ref >90)
Potassium: 4.7 mEq/L (ref 3.5–5.1)
Sodium: 138 mEq/L (ref 135–145)

## 2013-01-02 MED ORDER — CLONIDINE HCL 0.1 MG PO TABS
0.1000 mg | ORAL_TABLET | Freq: Three times a day (TID) | ORAL | Status: DC
  Administered 2013-01-02 – 2013-01-05 (×9): 0.1 mg via ORAL
  Filled 2013-01-02 (×11): qty 1

## 2013-01-02 MED ORDER — ISOSORBIDE MONONITRATE ER 60 MG PO TB24
60.0000 mg | ORAL_TABLET | Freq: Every day | ORAL | Status: DC
  Administered 2013-01-03 – 2013-01-04 (×2): 60 mg via ORAL
  Filled 2013-01-02 (×2): qty 1

## 2013-01-02 NOTE — Progress Notes (Signed)
[facility] Progress Note [place] TEAM 1 - Stepdown/ICU TEAM   [name]:[id number] DOB: 17-Jul-1940 DOA: 12/31/2012 PCP: Gwynneth Aliment, MD  Admit HPI / Brief Narrative: 72 y.o. female w/ a past medical history significant for COPD, chronic respiratory failure status post trach on home O2 at 4L/Min, cor pulmonale, hypertension, previous CVA, chronic combined systolic and diastolic heart failure, with multiple prior hospitalizations due to acute decompensation of her heart failure, recently hospitalized and discharged 10 days ago, who presented to the emergency room with a chief complaint of worsening shortness of breath and cough with sputum production for 2-3 days, as well as weight gain of about 8 pounds over 7 days. She was discharged home on a prednisone taper and doxycycline, however it was more than a week following her discharge before she was able to actually fill those medications. Her daughters endorsed that  the patient has a hard time being compliant with her fluid restriction, and they are not sure whether she was taking her medications accurately. She endorsed leg swelling and fluid buildup in her feet and hands.   In the ED, labs were pertinent for pertinent for creatinine of 2.8 (which her baseline), mild leukocytosis of 11.4, mild anemia (hemoglobin 8.8). Her BNP was elevated to 3000, which is worse than her last hospitalization. In the ED she was somewhat tachypneic breathing 30 times a minute  Assessment/Plan:  Dyspnea/Respiratory failure, acute on chronic - CHF + COPD, OHS/OSA, chronic LLL collapse Multifactorial, with volume overload as primary contributor - see other issues detailed below   Acute on Chronic CHF - systolic + R heart failure / mod pulm HTN  EF 45% with hypokinesis and grade 2 diastolic dysfunction per echo 09/1189 - net negative of -15 liters and a discharge weight of 172 lbs during admit Sept 2014 - followed in CHF clinic - CHF Team to be  consulted if pt does not continue to improve - pt only ~4# overweight at time of admit - diuresed almost 3L thus far - cr rising but will need to cont attempts at diuresis, and if stalls will consult CHF Team   Moderate pulmonary HTN and RV failure (on last echo 06/2012) Volume overloaded at this time - with BP stable/high   Uncontrolled HTN   elevated today- increase Imdur and clonidine today  COPD  Well compensated at present / no wheezing on exam   OHS/OSA  Trach dependent   Chronic LLL collapse  Stable via CXR   CKD stage IV  baseline Cr appears to be 2.5-2.8 - No ACE I  Anemia of chronic disease in setting of CKD  Hb 8.3 on admission - no evidence of acute blood loss - follow   DM 2 well controlled at present - no change in tx plan today   GERD   Gout  Well compensated at this time   HLD Cont Lipitor  Code Status: FULL Family Communication: no family present at time of exam Disposition Plan: remain in SDU 24hrs more w/ possible transfer to CHF floor in AM  Consultants: none  Procedures: none  Antibiotics: none  DVT prophylaxis: SQ heparin  HPI/Subjective: The patient appears uncomfortable and tells me she is not much better when compared to yesterday. Pos cough with white sputum. Does not want to get out of bed for fear that it will exacerbate resp issues- have encouraged her.   Objective: Blood pressure 183/59, pulse 57, temperature 98.7 F (37.1 C), temperature source Oral, resp. rate  27, height 5\' 4"  (1.626 m), weight 78.2 kg (172 lb 6.4 oz), SpO2 100.00%.  Intake/Output Summary (Last 24 hours) at 01/02/13 1148 Last data filed at 01/02/13 0900  Gross per 24 hour  Intake    977 ml  Output   2200 ml  Net  -1223 ml   Exam: General: No acute respiratory distress at rest in bed  Lungs: mild bibasilar crackles - no appreciable wheeze  Cardiovascular: Regular rate and rhythm without murmur gallop or rub normal S1 and S2 Abdomen: Nontender,  nondistended, soft, bowel sounds positive, no rebound, no ascites, no appreciable mass Extremities: No significant cyanosis, or clubbing;  1+edema bilateral lower extremities  Data Reviewed: Basic Metabolic Panel:  Recent Labs Lab 12/31/12 1255 01/01/13 0420 01/02/13 0529  NA 143 139 138  K 4.5 4.3 4.7  CL 108 103 102  CO2 25 27 27   GLUCOSE 112* 101* 109*  BUN 57* 58* 62*  CREATININE 2.80* 2.74* 3.15*  CALCIUM 9.2 9.0 8.8   Liver Function Tests: No results found for this basename: AST, ALT, ALKPHOS, BILITOT, PROT, ALBUMIN,  in the last 168 hours  CBC:  Recent Labs Lab 12/31/12 1255 01/01/13 0420 01/02/13 0529  WBC 11.4* 9.5 11.5*  NEUTROABS 8.6*  --   --   HGB 8.8* 8.3* 8.4*  HCT 27.6* 26.5* 26.3*  MCV 83.4 83.1 81.9  PLT 187 166 222   Cardiac Enzymes:  Recent Labs Lab 12/31/12 1255  TROPONINI <0.30   BNP (last 3 results)  Recent Labs  11/03/12 1021 12/17/12 1952 12/31/12 1255  PROBNP 1855.0* 1973.0* 3189.0*   CBG:  Recent Labs Lab 01/01/13 0753 01/01/13 1149 01/01/13 1720 01/01/13 2129 01/02/13 0812  GLUCAP 98 135* 137* 183* 103*    Recent Results (from the past 240 hour(s))  MRSA PCR SCREENING     Status: None   Collection Time    12/31/12  6:14 PM      Result Value Range Status   MRSA by PCR NEGATIVE  NEGATIVE Final   Comment:            The GeneXpert MRSA Assay (FDA     approved for NASAL specimens     only), is one component of a     comprehensive MRSA colonization     surveillance program. It is not     intended to diagnose MRSA     infection nor to guide or     monitor treatment for     MRSA infections.     Studies:  Recent x-ray studies have been reviewed in detail by the Attending Physician  Scheduled Meds:  Scheduled Meds: . amLODipine  10 mg Oral Daily  . arformoterol  15 mcg Nebulization BID  . aspirin  81 mg Oral Daily  . atorvastatin  40 mg Oral Daily  . budesonide  0.25 mg Nebulization BID  . busPIRone  5 mg  Oral BID  . carvedilol  18.75 mg Oral BID WC  . cloNIDine  0.1 mg Oral TID  . doxycycline  100 mg Oral Q12H  . ferrous sulfate  325 mg Oral BID WC  . furosemide  60 mg Intravenous Q12H  . heparin  5,000 Units Subcutaneous Q8H  . hydrALAZINE  100 mg Oral TID  . insulin aspart  0-9 Units Subcutaneous TID WC  . ipratropium  0.5 mg Nebulization QID  . [START ON 01/03/2013] isosorbide mononitrate  60 mg Oral Daily  . pantoprazole  40 mg Oral Daily  .  predniSONE  10 mg Oral BID WC  . sodium chloride  3 mL Intravenous Q12H    Time spent on care of this patient: 35 mins   Calvert Cantor, MD  [facility] Office  [phone] Pager - Text Page per Loretha Stapler as per below:  On-Call/Text Page:      [email or web address]      password [email or web address]  If 7PM-7AM, please contact night-coverage [email or web address] Password [email or web address] 01/02/2013, 11:48 AM   LOS: 2 days

## 2013-01-02 NOTE — Progress Notes (Signed)
01/02/2013- 1637- Resp care note- Pt given neb tx and then attempted to obtain sputum sample.  Pt has a strong non-productive cough.  No sputum produced for sample to be sent to lab.  Pt tolerated suction well.  S Bellatrix Devonshire rrt, rcp

## 2013-01-02 NOTE — Progress Notes (Signed)
01/02/2013- 1210- Resp Care Note-Pt educated on use of incentive spirometer.  Pt achieving 500-735ml with incentive.  Pt occluding trach tube momentarily with finger occlusion.  Pt showed understanding of use of incentive spirometer.

## 2013-01-03 DIAGNOSIS — G9340 Encephalopathy, unspecified: Secondary | ICD-10-CM

## 2013-01-03 DIAGNOSIS — I5023 Acute on chronic systolic (congestive) heart failure: Secondary | ICD-10-CM

## 2013-01-03 LAB — GLUCOSE, CAPILLARY
Glucose-Capillary: 121 mg/dL — ABNORMAL HIGH (ref 70–99)
Glucose-Capillary: 115 mg/dL — ABNORMAL HIGH (ref 70–99)
Glucose-Capillary: 242 mg/dL — ABNORMAL HIGH (ref 70–99)

## 2013-01-03 NOTE — Progress Notes (Signed)
resp therapy in with trachea care

## 2013-01-03 NOTE — Progress Notes (Signed)
Trach secured, dried, no breakdown noted at [place] site.

## 2013-01-03 NOTE — Progress Notes (Signed)
Report called to [facility] and given to [place], [place]

## 2013-01-03 NOTE — Progress Notes (Signed)
Patient transferred via bed with oxygen, monitor, and nurse at bedside to 2C-16 after report given to receiving nurse.  Patient acknowledged that all her personal belongings were transferred with her to include her [name] trach valve.

## 2013-01-03 NOTE — Progress Notes (Signed)
Trach secured. No breakdown noted at trach site. 

## 2013-01-03 NOTE — Progress Notes (Signed)
Pt. C/o SOB.  Recent and current O2 sat were 100% on 28% trach collar (5L/min).  Equipment checked and deep suctioning performed.  A small amount of thick white mucus was removed.  Pt. States relief of SOB after suctioning and O2 sat read 100%.  Respiratory made aware and on the way.  Will continue to monitor.

## 2013-01-03 NOTE — Progress Notes (Signed)
[facility] Progress Note [place] TEAM 1 - Stepdown/ICU TEAM   [name] ZOX:[id number] DOB: 21-Jun-1940 DOA: 12/31/2012 PCP: Gwynneth Aliment, MD  Admit HPI / Brief Narrative: 72 y.o. female w/ a past medical history significant for COPD, chronic respiratory failure status post trach on home O2 at 4L/Min, cor pulmonale, hypertension, previous CVA, chronic combined systolic and diastolic heart failure, with multiple prior hospitalizations due to acute decompensation of her heart failure, recently hospitalized and discharged 10 days ago, who presented to the emergency room with a chief complaint of worsening shortness of breath and cough with sputum production for 2-3 days, as well as weight gain of about 8 pounds over 7 days. She was discharged home on a prednisone taper and doxycycline, however it was more than a week following her discharge before she was able to actually fill those medications. Her daughters endorsed that  the patient has a hard time being compliant with her fluid restriction, and they are not sure whether she was taking her medications accurately. She endorsed leg swelling and fluid buildup in her feet and hands.   In the ED, labs were pertinent for pertinent for creatinine of 2.8 (which her baseline), mild leukocytosis of 11.4, mild anemia (hemoglobin 8.8). Her BNP was elevated to 3000, which is worse than her last hospitalization. In the ED she was somewhat tachypneic breathing 30 times a minute  Assessment/Plan:  Dyspnea/Respiratory failure, acute on chronic - CHF + COPD, OHS/OSA, chronic LLL collapse Multifactorial, with volume overload as primary contributor - see other issues detailed below   Acute on Chronic CHF - systolic + R heart failure / mod pulm HTN  EF 45% with hypokinesis and grade 2 diastolic dysfunction per echo 09/1189 - net negative of -15 liters and a discharge weight of 172 lbs during admit Sept 2014 - followed in CHF clinic - CHF Team to be  consulted if pt does not continue to improve - pt only ~4# overweight at time of admit - diuresed almost 4.7 L thus far - cr rising but will need to cont attempts at diuresis, and if stalls will consult CHF Team   Moderate pulmonary HTN and RV failure (on last echo 06/2012) Volume overloaded at this time - with BP stable/high   Uncontrolled HTN   elevated today- increase Imdur and clonidine today  COPD  Well compensated at present / no wheezing on exam   OHS/OSA  Trach dependent   Chronic LLL collapse  Stable via CXR   CKD stage IV  baseline Cr appears to be 2.5-2.8 - No ACE I  Anemia of chronic disease in setting of CKD  Hb 8.3 on admission - no evidence of acute blood loss - follow   DM 2 well controlled at present - no change in tx plan today   GERD   Gout  Well compensated at this time   HLD Cont Lipitor  Code Status: FULL Family Communication: no family present at time of exam Disposition Plan: Transfer to floor  Consultants: none  Procedures: none  Antibiotics: none  DVT prophylaxis: SQ heparin  HPI/Subjective: Alert and eating well- remains congested but hopeful that is improving and can dc soon  Objective: Blood pressure 151/65, pulse 64, temperature 98.2 F (36.8 C), temperature source Oral, resp. rate 26, height 5\' 4"  (1.626 m), weight 179 lb 10.8 oz (81.5 kg), SpO2 96.00%.  Intake/Output Summary (Last 24 hours) at 01/03/13 1220 Last data filed at 01/03/13 1132  Gross per 24 hour  Intake    780 ml  Output   2275 ml  Net  -1495 ml   Exam: General: No acute respiratory distress at rest in bed  Lungs: coarse exp rhonchi and basilar crackles - no appreciable wheeze  Cardiovascular: Regular rate and rhythm without murmur gallop or rub normal S1 and S2 Abdomen: Nontender, nondistended, soft, bowel sounds positive, no rebound, no ascites, no appreciable mass Extremities: No significant cyanosis, or clubbing;  1+edema bilateral lower  extremities  Data Reviewed: Basic Metabolic Panel:  Recent Labs Lab 12/31/12 1255 01/01/13 0420 01/02/13 0529  NA 143 139 138  K 4.5 4.3 4.7  CL 108 103 102  CO2 25 27 27   GLUCOSE 112* 101* 109*  BUN 57* 58* 62*  CREATININE 2.80* 2.74* 3.15*  CALCIUM 9.2 9.0 8.8   Liver Function Tests: No results found for this basename: AST, ALT, ALKPHOS, BILITOT, PROT, ALBUMIN,  in the last 168 hours  CBC:  Recent Labs Lab 12/31/12 1255 01/01/13 0420 01/02/13 0529  WBC 11.4* 9.5 11.5*  NEUTROABS 8.6*  --   --   HGB 8.8* 8.3* 8.4*  HCT 27.6* 26.5* 26.3*  MCV 83.4 83.1 81.9  PLT 187 166 222   Cardiac Enzymes:  Recent Labs Lab 12/31/12 1255  TROPONINI <0.30   BNP (last 3 results)  Recent Labs  11/03/12 1021 12/17/12 1952 12/31/12 1255  PROBNP 1855.0* 1973.0* 3189.0*   CBG:  Recent Labs Lab 01/02/13 0812 01/02/13 1211 01/02/13 1652 01/02/13 2144 01/03/13 0751  GLUCAP 103* 133* 138* 182* 121*    Recent Results (from the past 240 hour(s))  MRSA PCR SCREENING     Status: None   Collection Time    12/31/12  6:14 PM      Result Value Range Status   MRSA by PCR NEGATIVE  NEGATIVE Final   Comment:            The GeneXpert MRSA Assay (FDA     approved for NASAL specimens     only), is one component of a     comprehensive MRSA colonization     surveillance program. It is not     intended to diagnose MRSA     infection nor to guide or     monitor treatment for     MRSA infections.     Studies:  Recent x-ray studies have been reviewed in detail by the Attending Physician  Scheduled Meds:  Scheduled Meds: . amLODipine  10 mg Oral Daily  . arformoterol  15 mcg Nebulization BID  . aspirin  81 mg Oral Daily  . atorvastatin  40 mg Oral Daily  . budesonide  0.25 mg Nebulization BID  . busPIRone  5 mg Oral BID  . carvedilol  18.75 mg Oral BID WC  . cloNIDine  0.1 mg Oral TID  . doxycycline  100 mg Oral Q12H  . ferrous sulfate  325 mg Oral BID WC  .  furosemide  60 mg Intravenous Q12H  . heparin  5,000 Units Subcutaneous Q8H  . hydrALAZINE  100 mg Oral TID  . insulin aspart  0-9 Units Subcutaneous TID WC  . ipratropium  0.5 mg Nebulization QID  . isosorbide mononitrate  60 mg Oral Daily  . pantoprazole  40 mg Oral Daily  . predniSONE  10 mg Oral BID WC  . sodium chloride  3 mL Intravenous Q12H    Time spent on care of this patient: 35 mins   ELLIS,ALLISON L., ANP  [facility] Office  [phone] Pager - Text Page per Loretha Stapler as per below:  On-Call/Text Page:      [email or web address]      password [email or web address]  If 7PM-7AM, please contact night-coverage [email or web address] Password [email or web address] 01/03/2013, 12:20 PM   LOS: 3 days    I have personally examined the patient and reviewed the entire database. Agree with the above note and plan as outlined, any necessary changes made.  [name] M.D. [facility] 01/03/2013, 1:30 PM Pager: [phone]

## 2013-01-03 NOTE — Progress Notes (Signed)
Pt transferred to 2c16 from 2900, pt has a trach, alert, was made comfortable in bed, placed on tele, pt requested to be suction and was suctioned, vital signs were stable. Will continue to monitor pt.-----Carlita Whitcomb, rn

## 2013-01-04 ENCOUNTER — Inpatient Hospital Stay (HOSPITAL_COMMUNITY): Payer: Medicare Other

## 2013-01-04 DIAGNOSIS — E119 Type 2 diabetes mellitus without complications: Secondary | ICD-10-CM

## 2013-01-04 DIAGNOSIS — I110 Hypertensive heart disease with heart failure: Secondary | ICD-10-CM

## 2013-01-04 DIAGNOSIS — N184 Chronic kidney disease, stage 4 (severe): Secondary | ICD-10-CM

## 2013-01-04 LAB — BASIC METABOLIC PANEL
BUN: 80 mg/dL — ABNORMAL HIGH (ref 6–23)
Calcium: 8.8 mg/dL (ref 8.4–10.5)
Chloride: 102 mEq/L (ref 96–112)
GFR calc Af Amer: 15 mL/min — ABNORMAL LOW (ref >90)
GFR calc non Af Amer: 13 mL/min — ABNORMAL LOW (ref >90)
Potassium: 5.4 mEq/L — ABNORMAL HIGH (ref 3.5–5.1)
Sodium: 137 mEq/L (ref 135–145)

## 2013-01-04 LAB — GLUCOSE, CAPILLARY
Glucose-Capillary: 143 mg/dL — ABNORMAL HIGH (ref 70–99)
Glucose-Capillary: 82 mg/dL (ref 70–99)
Glucose-Capillary: 166 mg/dL — ABNORMAL HIGH (ref 70–99)

## 2013-01-04 MED ORDER — ISOSORBIDE MONONITRATE ER 60 MG PO TB24
120.0000 mg | ORAL_TABLET | Freq: Every day | ORAL | Status: DC
  Administered 2013-01-05 – 2013-01-06 (×2): 120 mg via ORAL
  Filled 2013-01-04 (×2): qty 2

## 2013-01-04 MED ORDER — FUROSEMIDE 10 MG/ML IJ SOLN
2.0000 mg/h | INTRAVENOUS | Status: DC
  Administered 2013-01-04: 2 mg/h via INTRAVENOUS
  Filled 2013-01-04: qty 25

## 2013-01-04 MED ORDER — SODIUM POLYSTYRENE SULFONATE 15 GM/60ML PO SUSP
30.0000 g | Freq: Once | ORAL | Status: AC
  Administered 2013-01-04: 30 g via ORAL
  Filled 2013-01-04 (×2): qty 120

## 2013-01-04 NOTE — Progress Notes (Signed)
I cosign for [name]'s (student nurse) assessment, I & O, care plan, education, notes, and medication administration. 

## 2013-01-04 NOTE — Progress Notes (Signed)
[facility] PROGRESS NOTE Interim History: 72 y.o. female w/ a past medical history significant for COPD, chronic respiratory failure status post trach on home O2 at 4L/Min, cor pulmonale, hypertension, previous CVA, chronic combined systolic and diastolic heart failure, with multiple prior hospitalizations due to acute decompensation of her heart failure, recently hospitalized and discharged 10 days ago, who presented to the emergency room with a chief complaint of worsening shortness of breath and cough with sputum production for 2-3 days, as well as weight gain of about 8 pounds over 7 days. She was discharged home on a prednisone taper and doxycycline, however it was more than a week following her discharge before she was able to actually fill those medications. Her daughters endorsed that the patient has a hard time being compliant with her fluid restriction, and they are not sure whether she was taking her medications accurately. She endorsed leg swelling and fluid buildup in her feet and hands.   Filed Weights   01/03/13 0644 01/03/13 1900 01/04/13 0534  Weight: 81.5 kg (179 lb 10.8 oz) 81.1 kg (178 lb 12.7 oz) 80 kg (176 lb 5.9 oz)        Intake/Output Summary (Last 24 hours) at 01/04/13 1406 Last data filed at 01/04/13 1330  Gross per 24 hour  Intake    960 ml  Output   1451 ml  Net   -491 ml     Assessment/Plan: Acute on chronic Respiratory failure, acute on chronic - CHF + COPD, OHS/OSA, chronic LLL collapse  - Multifactorial as below.  Acute on Chronic CHF - systolic + R heart failure / mod pulm HTN  - EF 45% with hypokinesis and grade 2 diastolic dysfunction per echo 07/2950, due to noncomplaince with medications and none compliance with diet. - Estimated dry weight of 172 lbs. - Cr rising but will need to cont attempts at diuresis. - Cont to be negative, change to lasix drip, b-met in am. - Will probably need torsemide 40 mg BID.  Moderate pulmonary HTN and RV  failure (on last echo 06/2012)  - Bp still high.  Uncontrolled HTN  - elevated today- increase Imdur and hydralazine and clonidine today.   COPD  Well compensated at present / no wheezing on exam   OHS/OSA  Trach dependent   Chronic LLL collapse  Stable via CXR   CKD stage IV  baseline Cr appears to be 2.5-2.8 - No ACE I   Anemia of chronic disease in setting of CKD  Hb 8.3 on admission - no evidence of acute blood loss - follow   DM 2  well controlled at present - no change in tx plan today.  HLD  Cont Lipitor    Code Status: full Family Communication: daughter  Disposition Plan: inpatient   Consultants:  Heart failure team  Procedures: ECHO: estimated ejection fraction was 45%. There is hypokinesis of the inferolateral, inferior, and inferoseptal myocardium. Features are consistent with a pseudonormal left ventricular filling pattern, with concomitant abnormal relaxation and increased filling pressure (grade 2 diastolic dysfunction).   HPI/Subjective: Relates her SOB is improved.  Objective: Filed Vitals:   01/04/13 1019 01/04/13 1100 01/04/13 1157 01/04/13 1159  BP: 163/63     Pulse: 60 56 56   Temp:      TempSrc:      Resp:   18   Height:      Weight:      SpO2:   100% 100%     Exam:  General:  Alert, awake, oriented x3, in no acute distress.  HEENT: No bruits, no goiter.  Heart: Regular rate and rhythm, without murmurs, rubs, gallops.  Lungs: Good air movement, bilateral air movement.  Abdomen: Soft, nontender, nondistended, positive bowel sounds.    Data Reviewed: Basic Metabolic Panel:  Recent Labs Lab 12/31/12 1255 01/01/13 0420 01/02/13 0529 01/04/13 0340  NA 143 139 138 137  K 4.5 4.3 4.7 5.4*  CL 108 103 102 102  CO2 25 27 27 24   GLUCOSE 112* 101* 109* 105*  BUN 57* 58* 62* 80*  CREATININE 2.80* 2.74* 3.15* 3.38*  CALCIUM 9.2 9.0 8.8 8.8   Liver Function Tests: No results found for this basename: AST, ALT, ALKPHOS,  BILITOT, PROT, ALBUMIN,  in the last 168 hours No results found for this basename: LIPASE, AMYLASE,  in the last 168 hours No results found for this basename: AMMONIA,  in the last 168 hours CBC:  Recent Labs Lab 12/31/12 1255 01/01/13 0420 01/02/13 0529  WBC 11.4* 9.5 11.5*  NEUTROABS 8.6*  --   --   HGB 8.8* 8.3* 8.4*  HCT 27.6* 26.5* 26.3*  MCV 83.4 83.1 81.9  PLT 187 166 222   Cardiac Enzymes:  Recent Labs Lab 12/31/12 1255  TROPONINI <0.30   BNP (last 3 results)  Recent Labs  11/03/12 1021 12/17/12 1952 12/31/12 1255  PROBNP 1855.0* 1973.0* 3189.0*   CBG:  Recent Labs Lab 01/03/13 1225 01/03/13 1713 01/03/13 2050 01/04/13 0556 01/04/13 1130  GLUCAP 115* 126* 242* 143* 82    Recent Results (from the past 240 hour(s))  MRSA PCR SCREENING     Status: None   Collection Time    12/31/12  6:14 PM      Result Value Range Status   MRSA by PCR NEGATIVE  NEGATIVE Final   Comment:            The GeneXpert MRSA Assay (FDA     approved for NASAL specimens     only), is one component of a     comprehensive MRSA colonization     surveillance program. It is not     intended to diagnose MRSA     infection nor to guide or     monitor treatment for     MRSA infections.     Studies: No results found.  Scheduled Meds: . amLODipine  10 mg Oral Daily  . arformoterol  15 mcg Nebulization BID  . aspirin  81 mg Oral Daily  . atorvastatin  40 mg Oral Daily  . budesonide  0.25 mg Nebulization BID  . busPIRone  5 mg Oral BID  . carvedilol  18.75 mg Oral BID WC  . cloNIDine  0.1 mg Oral TID  . doxycycline  100 mg Oral Q12H  . ferrous sulfate  325 mg Oral BID WC  . furosemide  60 mg Intravenous Q12H  . heparin  5,000 Units Subcutaneous Q8H  . hydrALAZINE  100 mg Oral TID  . insulin aspart  0-9 Units Subcutaneous TID WC  . ipratropium  0.5 mg Nebulization QID  . isosorbide mononitrate  60 mg Oral Daily  . pantoprazole  40 mg Oral Daily  . predniSONE  10 mg  Oral BID WC  . sodium chloride  3 mL Intravenous Q12H   Continuous Infusions:    Marinda Elk  [facility] Pager [phone]. If 8PM-8AM, please contact night-coverage at [email or web address], password [email or web address] 01/04/2013, 2:06 PM  LOS: 4 days

## 2013-01-05 LAB — BASIC METABOLIC PANEL
CO2: 26 mEq/L (ref 19–32)
Calcium: 8.9 mg/dL (ref 8.4–10.5)
Chloride: 100 mEq/L (ref 96–112)
GFR calc Af Amer: 14 mL/min — ABNORMAL LOW (ref >90)
GFR calc non Af Amer: 12 mL/min — ABNORMAL LOW (ref >90)
Glucose, Bld: 175 mg/dL — ABNORMAL HIGH (ref 70–99)
Potassium: 4.8 mEq/L (ref 3.5–5.1)
Sodium: 137 mEq/L (ref 135–145)

## 2013-01-05 LAB — GLUCOSE, CAPILLARY
Glucose-Capillary: 125 mg/dL — ABNORMAL HIGH (ref 70–99)
Glucose-Capillary: 149 mg/dL — ABNORMAL HIGH (ref 70–99)
Glucose-Capillary: 156 mg/dL — ABNORMAL HIGH (ref 70–99)

## 2013-01-05 MED ORDER — PREDNISONE 10 MG PO TABS: 5.0000 mg | ORAL_TABLET | Freq: Two times a day (BID) | ORAL | Status: DC

## 2013-01-05 MED ORDER — CLONIDINE HCL 0.2 MG PO TABS
0.2000 mg | ORAL_TABLET | Freq: Three times a day (TID) | ORAL | Status: DC
  Administered 2013-01-05 – 2013-01-06 (×3): 0.2 mg via ORAL
  Filled 2013-01-05 (×6): qty 1

## 2013-01-05 MED ORDER — CLONIDINE HCL 0.2 MG PO TABS: 0.2000 mg | ORAL_TABLET | Freq: Two times a day (BID) | ORAL | Status: DC

## 2013-01-05 MED ORDER — TORSEMIDE 20 MG PO TABS: 40.0000 mg | ORAL_TABLET | Freq: Two times a day (BID) | ORAL | Status: DC

## 2013-01-05 MED ORDER — SODIUM POLYSTYRENE SULFONATE 15 GM/60ML PO SUSP
30.0000 g | Freq: Once | ORAL | Status: DC
  Filled 2013-01-05: qty 120

## 2013-01-05 NOTE — Progress Notes (Signed)
Pt being discharged today, called [name] (daughter) to see when she would be here to take pt home and [name] said it would have to be tomorrow morning.  Told [name] that we could get a bus pass or taxi for pt to take home and [name] hung up on me.  Notified Child psychotherapist, who tried to talk to [name] as well and [name] notified social worker that she would not be able to pick pt up till in the morning and that she gets off work at 2330.  [name] then hung up on Child psychotherapist.  Will notify MD and continue to monitor.

## 2013-01-05 NOTE — Discharge Summary (Addendum)
Physician Discharge Summary  [name]:[id number] DOB: 06-23-1940 DOA: 12/31/2012  PCP: Gwynneth Aliment, MD  Admit date: 12/31/2012 Discharge date: 01/06/2013  Time spent: 45 minutes  Recommendations for Outpatient Follow-up:  1. Follow up with Heart failure clinic in 1 week.  Discharge Diagnoses:  Principal Problem:   Acute on chronic combined systolic and diastolic CHF (congestive heart failure) Active Problems:   Chronic respiratory failure   Obesity (BMI 30-39.9)   Type 2 diabetes mellitus with vascular disease   Hyperlipdemia   Sleep apnea   Chronic cor pulmonale   C O P D   Chronic kidney disease (CKD), stage IV (severe)   Bifascicular block   Anemia   Acute-on-chronic respiratory failure   Tracheostomy status   Discharge Condition: high risk for re-admission due to non complaince  Diet recommendation: low sodium fluid restricted diet  Filed Weights   01/04/13 0534 01/05/13 0416 01/06/13 0559  Weight: 80 kg (176 lb 5.9 oz) 81.466 kg (179 lb 9.6 oz) 80.2 kg (176 lb 12.9 oz)    History of present illness:  72 y.o. female has a past medical history significant for COPD, chronic respiratory failure status post trach, chronic cor pulmonale, hypertension, previous CVA, chronic combined systolic and diastolic heart failure, with multiple hospitalization due to acute decompensation of her heart failure, recently hospitalized and discharged 10 days ago, presents to the emergency room with chief complaint of worsening shortness of breath and cough with sputum production for the past 2-3 days, as well as weight gain of about 8 pounds in the last week. Her presentation during her previous hospital stay with similar, and her shortness for breath was thought to be multifactorial due to acute exacerbation of her heart failure and COPD exacerbation. She was discharged home on a prednisone taper and doxycycline, however it's been more than a week following her discharge up  until she was able to actually feel those medications. Her daughters in the room, and she also endorses the patient has a hard time being compliant with her fluid restriction, and she is not sure when whether she is taking her medications accurately. Patient denies any chest pain, denies any fevers at home, she has no abdominal pain nausea or vomiting, she denies any lightheadedness or dizziness. She endorses leg swelling and fluid buildup in her feet and hands   Hospital Course:  Acute on chronic Respiratory failure, acute on chronic - CHF + COPD, OHS/OSA, chronic LLL collapse  - Multifactorial as below.   Acute on Chronic CHF - systolic + R heart failure / mod pulm HTN: - Due to noncompliance with her medication and fluid restriction. - EF 45% with hypokinesis and grade 2 diastolic dysfunction per echo 10/5619, due to noncomplaince with medications and none compliance with diet.  - started on IV lasix and diureses well. - pt was not compliant with her diet through her hospital stay she wanted double portion and most of the time I was there she had 3-4 cups of water. She also was complaining of being thirsty and that she wanted more water. - I tried to explain to her risk of doing this and why her condition requires her to compliant with fluid restriction. - Estimated dry weight of 175 lbs.  - Home on torsemide 40 mg BID.  - follow up with heart failure clinic in 1 wee.   Uncontrolled HTN  - Bp mildly high.  - added clonidine. Not a candidate for aldactone due to her renal function. -  not a candidate for ACE-I due to her renal function. - titrate hydralazine and imdur.  COPD  Well compensated at present / no wheezing on exam. - cont titration of steroids.  OHS/OSA  Trach dependent   Chronic LLL collapse  Stable via CXR   CKD stage IV  baseline Cr appears to be 2.5-2.8 - No ACE I    Procedures:  CXR  Consultations:  none  Discharge Exam: Filed Vitals:   01/06/13 0804   BP:   Pulse: 63  Temp:   Resp: 18    General: A&O x3 Cardiovascular: RRR Respiratory: good air movement CTA B/L  Discharge Instructions      Discharge Orders   Future Appointments Provider Department Dept Phone   01/09/2013 10:40 AM [facility] [phone]   01/22/2013 3:30 PM Barbaraann Share, MD [facility] [phone]   Future Orders Complete By Expires   Diet - low sodium heart healthy  As directed    Increase activity slowly  As directed        Medication List    STOP taking these medications       doxycycline 100 MG tablet  Commonly known as:  VIBRA-TABS      TAKE these medications       albuterol (2.5 MG/3ML) 0.083% nebulizer solution  Commonly known as:  PROVENTIL  Take 2.5 mg by nebulization every 6 (six) hours as needed for wheezing.     amLODipine 10 MG tablet  Commonly known as:  NORVASC  Take 10 mg by mouth daily.     arformoterol 15 MCG/2ML Nebu  Commonly known as:  BROVANA  Take 2 mLs (15 mcg total) by nebulization 2 (two) times daily.     aspirin 81 MG chewable tablet  Chew 1 tablet (81 mg total) by mouth daily.     atorvastatin 40 MG tablet  Commonly known as:  LIPITOR  Take 1 tablet (40 mg total) by mouth daily.     BIOTENE MOISTURIZING MOUTH Soln  Use as directed 1 spray in the mouth or throat daily.     budesonide 0.25 MG/2ML nebulizer solution  Commonly known as:  PULMICORT  Take 2 mLs (0.25 mg total) by nebulization 2 (two) times daily.     busPIRone 5 MG tablet  Commonly known as:  BUSPAR  Take 5 mg by mouth 2 (two) times daily.     carvedilol 6.25 MG tablet  Commonly known as:  COREG  Take 3 tablets (18.75 mg total) by mouth 2 (two) times daily with a meal.     chlorhexidine 0.12 % solution  Commonly known as:  PERIDEX  Use as directed 15 mLs in the mouth or throat 4 (four) times daily.     cloNIDine 0.2 MG tablet  Commonly known as:  CATAPRES  Take 1  tablet (0.2 mg total) by mouth 2 (two) times daily.     ferrous sulfate 325 (65 FE) MG EC tablet  Take 1 tablet (325 mg total) by mouth 2 (two) times daily.     hydrALAZINE 100 MG tablet  Commonly known as:  APRESOLINE  Take 100 mg by mouth 3 (three) times daily.     ipratropium 0.02 % nebulizer solution  Commonly known as:  ATROVENT  Take 2.5 mLs (0.5 mg total) by nebulization 4 (four) times daily.     isosorbide mononitrate 30 MG 24 hr tablet  Commonly known as:  IMDUR  Take  1 tablet (30 mg total) by mouth daily.     LORazepam 0.5 MG tablet  Commonly known as:  ATIVAN  Take 1 tablet (0.5 mg total) by mouth every 6 (six) hours as needed for anxiety.     metoCLOPramide 5 MG tablet  Commonly known as:  REGLAN  Take 5 mg by mouth every 6 (six) hours.     multivitamin with minerals Tabs tablet  Take 1 tablet by mouth daily.     oxycodone 5 MG capsule  Commonly known as:  OXY-IR  Take 5 mg by mouth every 6 (six) hours as needed (for moderate pain).     pantoprazole 20 MG tablet  Commonly known as:  PROTONIX  Take 40 mg by mouth daily.     potassium chloride 10 MEQ tablet  Commonly known as:  K-DUR,KLOR-CON  Take 10 mEq by mouth 2 (two) times daily.     predniSONE 10 MG tablet  Commonly known as:  DELTASONE  Take 0.5 tablets (5 mg total) by mouth 2 (two) times daily with a meal.     torsemide 20 MG tablet  Commonly known as:  DEMADEX  Take 2 tablets (40 mg total) by mouth 2 (two) times daily.     vitamin C 500 MG tablet  Commonly known as:  ASCORBIC ACID  Take 500 mg by mouth daily.       Allergies  Allergen Reactions  . Other Itching    "Wool"  . Sulfonamide Derivatives Hives and Rash   Follow-up Information   Follow up with [facility] In 1 week. (hospital follow up )    Specialty:  Cardiology   Contact information:   [id number] [place] [address] [phone]      Follow up with  Gwynneth Aliment, MD In 2 weeks. (hospital follow up )    Specialty:  Internal Medicine   Contact information:   [address] [place] [address] [phone]        The results of significant diagnostics from this hospitalization (including imaging, microbiology, ancillary and laboratory) are listed below for reference.    Significant Diagnostic Studies: Ct Chest Wo Contrast  12/17/2012   CLINICAL DATA:  Evaluate left lung opacity. The tracheostomy with respiratory failure.  EXAM: CT CHEST WITHOUT CONTRAST  TECHNIQUE: Multidetector CT imaging of the chest was performed following the standard protocol without IV contrast.  COMPARISON:  11/27/2012 chest x-ray.  01/04/2012 chest CT.  FINDINGS: THORACIC INLET/BODY WALL:  Stable appearance of tracheostomy tube.  MEDIASTINUM:  Cardiomegaly. No pericardial effusion. Extensive coronary artery atherosclerosis. Extensive aortic and great vessel atherosclerosis, without evidence of acute abnormality. No mediastinal lymphadenopathy. Small sliding-type hiatal hernia.  LUNG WINDOWS:  Chronic elevation of the left diaphragm, with chronic bandlike opacities overlying - consistent with scarring. No evidence of consolidation or mass. No effusion or pneumothorax.  UPPER ABDOMEN:  Numerous bilateral low dense renal masses, partly imaged, are similar in appearance to prior. Bilateral adrenal masses, with the largest on the right measuring approximately 3 cm. Appearance/stability remain consistent with adenomas.  OSSEOUS:  No acute findings.  IMPRESSION: Left lung opacity represents chronic elevation of the left diaphragm with overlying scarring. No evidence of mass or pneumonia.   Electronically Signed   By: Tiburcio Pea M.D.   On: 12/17/2012 23:03   Dg Chest Port 1 View  01/04/2013   CLINICAL DATA:  Pulmonary edema.  EXAM: PORTABLE CHEST - 1  VIEW  COMPARISON:  01/02/2013  FINDINGS: Tracheostomy tube in good position. Stable cardiomegaly. Chronic  elevation of the left hemidiaphragm. Aeration at the left base has improved. There is still slight linear atelectasis in the midzone. Right lung is clear. Vascularity is normal.  IMPRESSION: Improving atelectasis at the left lung base.   Electronically Signed   By: Geanie Cooley M.D.   On: 01/04/2013 18:54   Dg Chest Port 1 View  01/02/2013   CLINICAL DATA:  Respiratory failure. Shortness of breath. Weakness. COPD. Congestive heart failure.  EXAM: PORTABLE CHEST - 1 VIEW  COMPARISON:  12/31/2012  FINDINGS: Elevation of left hemidiaphragm with associated scarring or atelectasis in the left lung base shows no significant change. Cardiomegaly is stable. Right lung is clear. Tracheostomy tube remains in appropriate position.  IMPRESSION: Stable elevation of left hemidiaphragm with left basilar scarring versus atelectasis.  Stable cardiomegaly.   Electronically Signed   By: Myles Rosenthal M.D.   On: 01/02/2013 14:16   Dg Chest Portable 1 View  12/31/2012   CLINICAL DATA:  Shortness of breath.  EXAM: PORTABLE CHEST - 1 VIEW  COMPARISON:  12/17/2012  FINDINGS: Tracheostomy shows stable positioning. There is slight worsening of left lower lobe atelectasis. Mild atelectasis present at the right lung base. No evidence of edema or visible pleural fluid. There is stable cardiomegaly. The visualized skeletal structures are unremarkable.  IMPRESSION: Worsening of left lower lobe atelectasis. Mild atelectasis present at the right lung base.   Electronically Signed   By: Irish Lack M.D.   On: 12/31/2012 13:49   Dg Chest Portable 1 View  12/17/2012   CLINICAL DATA:  Chest pain and shortness of breath  EXAM: PORTABLE CHEST - 1 VIEW  COMPARISON:  October 09, 2012  FINDINGS: There is persistent consolidation in the left lower lobe. There is atelectatic change in the left mid lung. The right lung is clear. Heart is borderline prominent with normal pulmonary vascularity.  Tracheostomy is well seated. No pneumothorax. No  adenopathy. No bone lesions.  IMPRESSION: Persistent consolidation in the left lower lobe. Given the chronicity of this finding, it may be prudent to correlate with chest CT or bronchoscopy to further assess. No new opacity is seen. Heart is prominent but stable.   Electronically Signed   By: Bretta Bang M.D.   On: 12/17/2012 15:27    Microbiology: Recent Results (from the past 240 hour(s))  MRSA PCR SCREENING     Status: None   Collection Time    12/31/12  6:14 PM      Result Value Range Status   MRSA by PCR NEGATIVE  NEGATIVE Final   Comment:            The GeneXpert MRSA Assay (FDA     approved for NASAL specimens     only), is one component of a     comprehensive MRSA colonization     surveillance program. It is not     intended to diagnose MRSA     infection nor to guide or     monitor treatment for     MRSA infections.     Labs: Basic Metabolic Panel:  Recent Labs Lab 12/31/12 1255 01/01/13 0420 01/02/13 0529 01/04/13 0340 01/05/13 0900  NA 143 139 138 137 137  K 4.5 4.3 4.7 5.4* 4.8  CL 108 103 102 102 100  CO2 25 27 27 24 26   GLUCOSE 112* 101* 109* 105* 175*  BUN 57* 58* 62* 80*  88*  CREATININE 2.80* 2.74* 3.15* 3.38* 3.58*  CALCIUM 9.2 9.0 8.8 8.8 8.9   Liver Function Tests: No results found for this basename: AST, ALT, ALKPHOS, BILITOT, PROT, ALBUMIN,  in the last 168 hours No results found for this basename: LIPASE, AMYLASE,  in the last 168 hours No results found for this basename: AMMONIA,  in the last 168 hours CBC:  Recent Labs Lab 12/31/12 1255 01/01/13 0420 01/02/13 0529  WBC 11.4* 9.5 11.5*  NEUTROABS 8.6*  --   --   HGB 8.8* 8.3* 8.4*  HCT 27.6* 26.5* 26.3*  MCV 83.4 83.1 81.9  PLT 187 166 222   Cardiac Enzymes:  Recent Labs Lab 12/31/12 1255  TROPONINI <0.30   BNP: BNP (last 3 results)  Recent Labs  11/03/12 1021 12/17/12 1952 12/31/12 1255  PROBNP 1855.0* 1973.0* 3189.0*   CBG:  Recent Labs Lab 01/05/13 0622  01/05/13 1124 01/05/13 1549 01/05/13 2123 01/06/13 0628  GLUCAP 125* 84 149* 156* 107*       Signed:  Marinda Elk  [facility] 01/06/2013, 8:33 AM

## 2013-01-05 NOTE — Progress Notes (Signed)
PT Cancellation Note  Patient Details Name: [name] MRN: [id number] DOB: 12-May-1940   Cancelled Treatment:    Reason Eval/Treat Not Completed: Patient declined, no reason specified.  Pt would not make eye contact with therapy.  Refused politely.  When asked if PT could check back, pt agreed that PT could check back Monday.  Will return Monday to evaluate if pt allows.  Thanks.   [name],Naksh Radi 01/05/2013, 10:55 AM [facility] [phone] (pager)

## 2013-01-06 LAB — GLUCOSE, CAPILLARY
Glucose-Capillary: 107 mg/dL — ABNORMAL HIGH (ref 70–99)
Glucose-Capillary: 110 mg/dL — ABNORMAL HIGH (ref 70–99)

## 2013-01-06 MED ORDER — TORSEMIDE 20 MG PO TABS
40.0000 mg | ORAL_TABLET | Freq: Two times a day (BID) | ORAL | Status: DC
  Administered 2013-01-06: 11:00:00 40 mg via ORAL
  Filled 2013-01-06 (×2): qty 2

## 2013-01-06 NOTE — Progress Notes (Signed)
Verbalized understanding of discharge instructions.  Heart Failure education reinforced, along with care of trach and watching for respiratory symptoms.  Client was asked to please follow up with her PCP and the cardiologist as instructed.  Verbalized the importance of getting medication prescriptions filled and taking medications as ordered.  Taken down by wheelchair and utilizing nasal oxygen until gotten to daughter's car.  Placed on home oxygen when gotten to car.

## 2013-01-09 ENCOUNTER — Ambulatory Visit (HOSPITAL_COMMUNITY): Admission: RE | Admit: 2013-01-09 | Payer: Medicare Other | Source: Ambulatory Visit

## 2013-01-12 NOTE — Progress Notes (Signed)
Call to [facility] pharmacy. Spoke with pharmacist stated she had a script on hold for pt for Albuterol neb Tx. Pharmacist said she will fill Rx and cost. Called [name] back ( pt's daughter) and relayed info and cost of 16.29 for a months supply. Asked daughter to give pharmacy 1 hours time to fill script. Daughter said she will go get Rx later today.

## 2013-01-12 NOTE — Progress Notes (Addendum)
Pt's daughter Sunday Corn called stating that she could not get her mom's prescription filled for Proventil neb treatments. Took pt's info and reviewed After visit summary. Med was reordered upon discharge on 12/31/2012 but no prescription was called into [facility]. Advised pt's daughter to call her mother's PCP or pharmacy for refill.

## 2013-01-17 ENCOUNTER — Encounter (HOSPITAL_COMMUNITY): Payer: Self-pay

## 2013-01-17 ENCOUNTER — Ambulatory Visit (HOSPITAL_COMMUNITY)
Admission: RE | Admit: 2013-01-17 | Discharge: 2013-01-17 | Disposition: A | Discharge status: Home / Self Care | Payer: Medicare Other | Source: Ambulatory Visit | Attending: Internal Medicine | Admitting: Internal Medicine

## 2013-01-17 VITALS — BP 134/62 | HR 59 | Wt 175.1 lb

## 2013-01-17 DIAGNOSIS — J9610 Chronic respiratory failure, unspecified whether with hypoxia or hypercapnia: Secondary | ICD-10-CM | POA: Insufficient documentation

## 2013-01-17 DIAGNOSIS — N184 Chronic kidney disease, stage 4 (severe): Secondary | ICD-10-CM

## 2013-01-17 DIAGNOSIS — J449 Chronic obstructive pulmonary disease, unspecified: Secondary | ICD-10-CM | POA: Insufficient documentation

## 2013-01-17 DIAGNOSIS — J4489 Other specified chronic obstructive pulmonary disease: Secondary | ICD-10-CM | POA: Insufficient documentation

## 2013-01-17 DIAGNOSIS — I5032 Chronic diastolic (congestive) heart failure: Secondary | ICD-10-CM

## 2013-01-17 DIAGNOSIS — I252 Old myocardial infarction: Secondary | ICD-10-CM | POA: Insufficient documentation

## 2013-01-17 DIAGNOSIS — E119 Type 2 diabetes mellitus without complications: Secondary | ICD-10-CM | POA: Insufficient documentation

## 2013-01-17 DIAGNOSIS — K219 Gastro-esophageal reflux disease without esophagitis: Secondary | ICD-10-CM | POA: Insufficient documentation

## 2013-01-17 DIAGNOSIS — I509 Heart failure, unspecified: Secondary | ICD-10-CM | POA: Insufficient documentation

## 2013-01-17 DIAGNOSIS — I130 Hypertensive heart and chronic kidney disease with heart failure and stage 1 through stage 4 chronic kidney disease, or unspecified chronic kidney disease: Secondary | ICD-10-CM | POA: Insufficient documentation

## 2013-01-17 DIAGNOSIS — I2789 Other specified pulmonary heart diseases: Secondary | ICD-10-CM | POA: Insufficient documentation

## 2013-01-17 DIAGNOSIS — Z8674 Personal history of sudden cardiac arrest: Secondary | ICD-10-CM | POA: Insufficient documentation

## 2013-01-17 DIAGNOSIS — I5022 Chronic systolic (congestive) heart failure: Secondary | ICD-10-CM | POA: Insufficient documentation

## 2013-01-17 DIAGNOSIS — E662 Morbid (severe) obesity with alveolar hypoventilation: Secondary | ICD-10-CM | POA: Insufficient documentation

## 2013-01-17 DIAGNOSIS — Z8673 Personal history of transient ischemic attack (TIA), and cerebral infarction without residual deficits: Secondary | ICD-10-CM | POA: Insufficient documentation

## 2013-01-17 DIAGNOSIS — G4733 Obstructive sleep apnea (adult) (pediatric): Secondary | ICD-10-CM | POA: Insufficient documentation

## 2013-01-17 MED ORDER — METOLAZONE 5 MG PO TABS: 5.0000 mg | ORAL_TABLET | ORAL | Status: DC

## 2013-01-17 NOTE — Patient Instructions (Signed)
Follow up in 6 weeks  Take metolazone 5 mg every Friday  Do the following things EVERYDAY: 1) Weigh yourself in the morning before breakfast. Write it down and keep it in a log. 2) Take your medicines as prescribed 3) Eat low salt foods-Limit salt (sodium) to 2000 mg per day.  4) Stay as active as you can everyday 5) Limit all fluids for the day to less than 2 liters

## 2013-01-17 NOTE — Progress Notes (Signed)
Patient ID: [name], female   DOB: 06-Aug-1940, 72 y.o.   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 More recent cardiac history includes asystolic cardiac arrest 02/2012 in the setting of a/c respiratory failure, then NSTEMI/NSVT 06/2012 (felt type II, cath deferred due to CKD, started on Plavix).  RHC 09/13/11: mild pulmonary hypertension mean PA 28 with PVR 3, otherwise normal right heart pressures  2D echo 06/2012: EF 45%, severe concentric LVH, hypokinesis of inferolateral, inferior, and inferoseptal myocardium, grade 2 diastolic dysfunction, mild AS, moderately dilated LA/RA, mild-mod reduced RV systolic function, PA pressure , trivial pericardial effusion was identified posterior to the heart  Recently admitted to the hospital 10/06/2012 with complaints of SOB. This was her 10th admission over the last 1 year. She was diuresed with IV lasix and had a net negative of -15 liters and a discharge weight of 172 lbs. She has multiple issues with transportation to clinic appointments and is frequently a no show. Nephrololgy was consulted and did not feel candidate for RRT and family rejected SNF placement.   Admitted to 12/17/12 12/20/12 acute/chorinc respiratory failure. Paced on antibiotics. D/C creatinine 3.6 . D/C weight 176 pounds.   Admitted to [facility] 11/9/thourgh 01/06/13 Diuresed with IV lasix.and transitioned 40 mg torsemide twice a day.  Discharge weight 176 pounds.   She returns for post hospital follow up in a wheelchair.  Followed by paramedicine. Says she does not drink a lot of water. [facility] having a hard time coming to visits because she does not have a phone. Denies SOB/PND/Orthopnea. Taking all medications.    SH: Lives with  daughter who is on dialysis  ROS: All systems negative except as listed in HPI, PMH and Problem List.  Past Medical History  Diagnosis Date  . COPD (chronic obstructive pulmonary disease)     on home o2  . CVA (cerebral infarction)     hx  . Hypertensive heart disease with congestive heart failure   . Chronic cor pulmonale   . Chronic kidney disease (CKD), stage IV (severe)   . Hyperlipdemia   . Obesity (BMI 30-39.9)   . OSA (obstructive sleep apnea)     Does not use CPAP  . History of gout 09/07/2011  . Chronic combined systolic and diastolic CHF (congestive heart failure) 7/13    a. With cor pulmonale - EF 45-50%.  . Anemia   . Vascular disease   . Asthma   . Hypertension   . Diabetes mellitus   . GERD (gastroesophageal reflux disease)   . Arthritis   . RBBB   . Thrombocytopenia   . Acute and chronic respiratory failure     a. Trache placed 11/2011 for recurrent intubation (hypercarbic resp failure), post-intubation vocal cord edema, several issues since that time wih trache. b. On home O2 with trache.  . Obesity, unspecified 08/28/2012  . Obesity hypoventilation syndrome   . Lung collapse     a. Chronic LLL collapse.  . Cardiac arrest     a. Asystolic cardiac arrest 02/2012 in setting of a/c respiratory failure (ischemic w/u has been deferred due to worsening CKD).  . NSTEMI (non-ST elevated myocardial infarction)     a. 06/2012 felt type II - troponin  peak 1.45 - ischemic eval has been deferred due to CKD. Started on Plavix.  . Pulmonary HTN     a. mild pulm HTN by RHC 08/2011, felt due to lung disease. b. PA pressure by echo 06/2012.  Marland Kitchen NSVT (nonsustained ventricular tachycardia)     a. Noted on tele, admission in 06/2012.  Marland Kitchen Cirrhosis     a. Suspected on abd CT 12/2011.    Current Outpatient Prescriptions  Medication Sig Dispense Refill  . albuterol (PROVENTIL) (2.5 MG/3ML) 0.083% nebulizer solution Take 2.5 mg by nebulization every 6 (six) hours as needed for  wheezing.      Marland Kitchen amLODipine (NORVASC) 10 MG tablet Take 10 mg by mouth daily.      Marland Kitchen arformoterol (BROVANA) 15 MCG/2ML NEBU Take 2 mLs (15 mcg total) by nebulization 2 (two) times daily.  120 mL  1  . Artificial Saliva (BIOTENE MOISTURIZING MOUTH) SOLN Use as directed 1 spray in the mouth or throat daily.      Marland Kitchen aspirin 81 MG chewable tablet Chew 1 tablet (81 mg total) by mouth daily.      Marland Kitchen atorvastatin (LIPITOR) 40 MG tablet Take 1 tablet (40 mg total) by mouth daily.  30 tablet  1  . budesonide (PULMICORT) 0.25 MG/2ML nebulizer solution Take 2 mLs (0.25 mg total) by nebulization 2 (two) times daily.  60 mL  12  . busPIRone (BUSPAR) 5 MG tablet Take 5 mg by mouth 2 (two) times daily.      . carvedilol (COREG) 6.25 MG tablet Take 3 tablets (18.75 mg total) by mouth 2 (two) times daily with a meal.  30 tablet  0  . chlorhexidine (PERIDEX) 0.12 % solution Use as directed 15 mLs in the mouth or throat 4 (four) times daily.      . cloNIDine (CATAPRES) 0.2 MG tablet Take 1 tablet (0.2 mg total) by mouth 2 (two) times daily.  60 tablet  11  . ferrous sulfate 325 (65 FE) MG EC tablet Take 1 tablet (325 mg total) by mouth 2 (two) times daily.  60 tablet  3  . hydrALAZINE (APRESOLINE) 100 MG tablet Take 100 mg by mouth 3 (three) times daily.      Marland Kitchen ipratropium (ATROVENT) 0.02 % nebulizer solution Take 2.5 mLs (0.5 mg total) by nebulization 4 (four) times daily.  75 mL  12  . isosorbide mononitrate (IMDUR) 30 MG 24 hr tablet Take 1 tablet (30 mg total) by mouth daily.  30 tablet  1  . LORazepam (ATIVAN) 0.5 MG tablet Take 1 tablet (0.5 mg total) by mouth every 6 (six) hours as needed for anxiety.  30 tablet  0  . metoCLOPramide (REGLAN) 5 MG tablet Take 5 mg by mouth every 6 (six) hours.      . Multiple Vitamin (MULTIVITAMIN WITH MINERALS) TABS tablet Take 1 tablet by mouth daily.      Marland Kitchen oxycodone (OXY-IR) 5 MG capsule Take 5 mg by mouth every 6 (six) hours as needed (for moderate pain).      .  pantoprazole (PROTONIX) 20 MG tablet Take 40 mg by mouth daily.      . potassium chloride (K-DUR,KLOR-CON) 10 MEQ tablet Take 10 mEq by mouth 2 (two) times daily.       . predniSONE (DELTASONE) 10 MG tablet Take 0.5 tablets (5 mg total) by mouth 2 (two) times daily with a meal.      . torsemide (DEMADEX) 20 MG tablet Take 2 tablets (40  mg total) by mouth 2 (two) times daily.  30 tablet  0  . vitamin C (ASCORBIC ACID) 500 MG tablet Take 500 mg by mouth daily.       No current facility-administered medications for this encounter.   Filed Vitals:   01/17/13 1033  BP: 134/62  Pulse: 59  Weight: 175 lb 1.9 oz (79.434 kg)  SpO2: 100%   PHYSICAL EXAM: General:  Chronically ill appearing. No resp difficulty; in wheelchair with paramedic, [name] HEENT: normal; trach Neck: supple. JVP to 5-6 with prominent CV waves. Carotids 2+ bilaterally; no bruits. No lymphadenopathy or thryomegaly appreciated. Cor: PMI normal. Regular rate & rhythm. No rubs, gallops or murmurs. Lungs: diminshed in the bases bilaterally, on 2 liters O2 Abdomen: soft, nontender, nondistended. No hepatosplenomegaly. No bruits or masses. Good bowel sounds. Extremities: no cyanosis, clubbing, rash, edema Neuro: alert & orientedx3, cranial nerves grossly intact. Moves all 4 extremities w/o difficulty. Affect pleasant.   ASSESSMENT & PLAN:  1. CHF: Chronic systolic HF,  EF 16% with moderate pulmonary HTN and RV failure on last echo (06/2012) - NYHA IIIb symptoms. Volume status ok. Continue torsemide 40 mg tiwce a day.  Add 5 mg Metolazone once a week.(on Fridays)  - Continue current Coreg 18.75 mg twice a day, , hydralazine 100 mg tid/Imdur 30 mg daily. No ACEI or Cleda Daub with significant CKD.  - Reinforced the need and importance of daily weights, a low sodium diet, and fluid restriction (less than 2 L a day). Instructed to call the HF clinic if weight increases more than 3 lbs overnight or 5 lbs in a week.  Very difficult situation  due to cardiorenal syndrome. [facility] no longer following because they had difficulty contacting her. Continue paramedicine. Need to consider Hospice at next visit with frequent admits. Family refused SNF.  Goal weight  Closer to 170 -174 pounds.   2. Chronic respiratory failure: COPD, OHS/OSA, chronic LLL collapse. Needs ongoing pulmonary follow up.  3. CKD stage IV: Will get BMET next visit.   4. HTN- Stable Continue current regimen.    F/U 6  weeks  CLEGG,AMY,NP-C 10:53 AM  Patient seen and examined with Tonye Becket, NP. We discussed all aspects of the encounter. I agree with the assessment and plan as stated above.   She is doing pretty well today but continue to have frequent HF admissions. We had a long talk trying to drill down on the factors contributing to her readmissions. I reinforced need for dietary compliance, symptom monitoring and working with our Paramedicine team (who accompanied her to the visit today). We will add weekly metolazone to try to keep volume status in check on  Regular basis. Will need to watch renal function closely.   Total time spent ~50 mins. Over half that time spent in direct discussions of above.   Daniel Bensimhon,MD 1:08 PM

## 2013-01-22 ENCOUNTER — Institutional Professional Consult (permissible substitution): Payer: Self-pay | Admitting: Pulmonary Disease

## 2013-01-25 ENCOUNTER — Encounter (HOSPITAL_COMMUNITY): Payer: Self-pay

## 2013-03-06 ENCOUNTER — Encounter (HOSPITAL_COMMUNITY): Payer: Self-pay

## 2013-03-06 ENCOUNTER — Ambulatory Visit (HOSPITAL_COMMUNITY): Payer: Medicare Other

## 2013-03-12 ENCOUNTER — Other Ambulatory Visit (HOSPITAL_COMMUNITY): Payer: Medicare Other

## 2013-03-12 ENCOUNTER — Encounter (HOSPITAL_COMMUNITY): Payer: Self-pay

## 2013-03-19 ENCOUNTER — Other Ambulatory Visit (HOSPITAL_COMMUNITY): Payer: Self-pay | Admitting: Cardiology

## 2013-03-19 DIAGNOSIS — I509 Heart failure, unspecified: Secondary | ICD-10-CM

## 2013-03-21 ENCOUNTER — Ambulatory Visit (HOSPITAL_BASED_OUTPATIENT_CLINIC_OR_DEPARTMENT_OTHER)
Admission: RE | Admit: 2013-03-21 | Discharge: 2013-03-21 | Disposition: A | Discharge status: Home / Self Care | Payer: Medicare Other | Source: Ambulatory Visit | Attending: Internal Medicine | Admitting: Internal Medicine

## 2013-03-21 ENCOUNTER — Encounter (HOSPITAL_COMMUNITY): Payer: Self-pay

## 2013-03-21 ENCOUNTER — Ambulatory Visit (HOSPITAL_COMMUNITY)
Admission: RE | Admit: 2013-03-21 | Discharge: 2013-03-21 | Disposition: A | Discharge status: Home / Self Care | Payer: Medicare Other | Source: Ambulatory Visit | Attending: Internal Medicine | Admitting: Internal Medicine

## 2013-03-21 VITALS — BP 129/52 | HR 82 | Resp 20 | Wt 172.8 lb

## 2013-03-21 DIAGNOSIS — I5032 Chronic diastolic (congestive) heart failure: Secondary | ICD-10-CM

## 2013-03-21 DIAGNOSIS — M7989 Other specified soft tissue disorders: Secondary | ICD-10-CM | POA: Insufficient documentation

## 2013-03-21 DIAGNOSIS — R0989 Other specified symptoms and signs involving the circulatory and respiratory systems: Secondary | ICD-10-CM | POA: Insufficient documentation

## 2013-03-21 DIAGNOSIS — I10 Essential (primary) hypertension: Secondary | ICD-10-CM | POA: Insufficient documentation

## 2013-03-21 DIAGNOSIS — R0609 Other forms of dyspnea: Secondary | ICD-10-CM | POA: Insufficient documentation

## 2013-03-21 DIAGNOSIS — I517 Cardiomegaly: Secondary | ICD-10-CM

## 2013-03-21 DIAGNOSIS — I509 Heart failure, unspecified: Secondary | ICD-10-CM | POA: Insufficient documentation

## 2013-03-21 DIAGNOSIS — E785 Hyperlipidemia, unspecified: Secondary | ICD-10-CM | POA: Insufficient documentation

## 2013-03-21 DIAGNOSIS — E119 Type 2 diabetes mellitus without complications: Secondary | ICD-10-CM | POA: Insufficient documentation

## 2013-03-21 DIAGNOSIS — J449 Chronic obstructive pulmonary disease, unspecified: Secondary | ICD-10-CM

## 2013-03-21 DIAGNOSIS — Z8249 Family history of ischemic heart disease and other diseases of the circulatory system: Secondary | ICD-10-CM | POA: Insufficient documentation

## 2013-03-21 MED ORDER — METOLAZONE 5 MG PO TABS: 5.0000 mg | ORAL_TABLET | ORAL | Status: DC

## 2013-03-21 NOTE — Patient Instructions (Signed)
Take 2.5 mg metolazone tomorrow. Then continue 5 mg (1 tablet) every Friday along with torsemide 40 mg (2 tablets) twice a day.   Follow up 6 weeks.  Bring all medications with you next time.   Do the following things EVERYDAY: 1) Weigh yourself in the morning before breakfast. Write it down and keep it in a log. 2) Take your medicines as prescribed 3) Eat low salt foods-Limit salt (sodium) to 2000 mg per day.  4) Stay as active as you can everyday 5) Limit all fluids for the day to less than 2 liters 6)

## 2013-03-21 NOTE — Progress Notes (Signed)
Patient ID: [name], female   DOB: [birth year], 73 y.o.   MRN: [id number]   Weight Range   Baseline proBNP    HPI: 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 More recent cardiac history includes asystolic cardiac arrest 02/2012 in the setting of a/c respiratory failure, then NSTEMI/NSVT 06/2012 (felt type II, cath deferred due to CKD, started on Plavix).  RHC 09/13/11: mild pulmonary hypertension mean PA 28 with PVR 3, otherwise normal right heart pressures  2D echo 06/2012: EF 45%, severe concentric LVH, hypokinesis of inferolateral, inferior, and inferoseptal myocardium, grade 2 diastolic dysfunction, mild AS, moderately dilated LA/RA, mild-mod reduced RV systolic function, PA pressure , trivial pericardial effusion was identified posterior to the heart  Recently admitted to the hospital 10/06/2012 with complaints of SOB. This was her 10th admission over the last 1 year. She was diuresed with IV lasix and had a net negative of -15 liters and a discharge weight of 172 lbs. She has multiple issues with transportation to clinic appointments and is frequently a no show. Nephrololgy was consulted and did not feel candidate for RRT and family rejected SNF placement.   Admitted to 12/17/12 12/20/12 acute/chorinc respiratory failure. Paced on antibiotics. D/C creatinine 3.6 . D/C weight 176 pounds.   Admitted to [facility] 11/9/thourgh 01/06/13 Diuresed with IV lasix.and transitioned 40 mg torsemide twice a day.  Discharge weight 176 pounds.   Echo today 03/21/13  EF 55% moderate to severe LVH. RV ok.   Follow up: Last visit started metolazone 5 mg weekly, which she reports she is taking. New caregiver today which is not sure of her meds yet and patient not completely sure of home meds. Weight at home 172-175 lbs.  + SOB and minimal CP. +orthopnea, cough and edema. Reports following a low salt diet and drinking less than 2L a day.  Followed by paramedicine. Says she does not drink a lot of water.   SH: Lives with daughter who is on dialysis  ROS: All systems negative except as listed in HPI, PMH and Problem List.  Past Medical History  Diagnosis Date  . COPD (chronic obstructive pulmonary disease)     on home o2  . CVA (cerebral infarction)     hx  . Hypertensive heart disease with congestive heart failure   . Chronic cor pulmonale   . Chronic kidney disease (CKD), stage IV (severe)   . Hyperlipdemia   . Obesity (BMI 30-39.9)   . OSA (obstructive sleep apnea)     Does not use CPAP  . History of gout 09/07/2011  . Chronic combined systolic and diastolic CHF (congestive heart failure) 7/13    a. With cor pulmonale - EF 45-50%.  . Anemia   . Vascular disease   . Asthma   . Hypertension   . Diabetes mellitus   . GERD (gastroesophageal reflux disease)   . Arthritis   . RBBB   . Thrombocytopenia   . Acute and chronic respiratory failure     a. Trache placed 11/2011 for recurrent intubation (hypercarbic resp failure), post-intubation vocal cord edema, several issues since that time wih trache. b. On home O2 with trache.  . Obesity, unspecified 08/28/2012  . Obesity hypoventilation syndrome   . Lung collapse     a. Chronic LLL collapse.  Marland Kitchen  Cardiac arrest     a. Asystolic cardiac arrest 02/2012 in setting of a/c respiratory failure (ischemic w/u has been deferred due to worsening CKD).  . NSTEMI (non-ST elevated myocardial infarction)     a. 06/2012 felt type II - troponin peak 1.45 - ischemic eval has been deferred due to CKD. Started on Plavix.  . Pulmonary HTN     a. mild pulm HTN by RHC 08/2011, felt due to lung disease. b. PA pressure by echo 06/2012.  Marland Kitchen NSVT (nonsustained ventricular tachycardia)     a. Noted on tele, admission in 06/2012.  Marland Kitchen Cirrhosis     a. Suspected on abd CT  12/2011.    Current Outpatient Prescriptions  Medication Sig Dispense Refill  . albuterol (PROVENTIL) (2.5 MG/3ML) 0.083% nebulizer solution Take 2.5 mg by nebulization every 6 (six) hours as needed for wheezing.      Marland Kitchen amLODipine (NORVASC) 10 MG tablet Take 10 mg by mouth daily.      Marland Kitchen arformoterol (BROVANA) 15 MCG/2ML NEBU Take 2 mLs (15 mcg total) by nebulization 2 (two) times daily.  120 mL  1  . Artificial Saliva (BIOTENE MOISTURIZING MOUTH) SOLN Use as directed 1 spray in the mouth or throat daily.      Marland Kitchen aspirin 81 MG chewable tablet Chew 1 tablet (81 mg total) by mouth daily.      Marland Kitchen atorvastatin (LIPITOR) 40 MG tablet Take 1 tablet (40 mg total) by mouth daily.  30 tablet  1  . budesonide (PULMICORT) 0.25 MG/2ML nebulizer solution Take 2 mLs (0.25 mg total) by nebulization 2 (two) times daily.  60 mL  12  . busPIRone (BUSPAR) 5 MG tablet Take 5 mg by mouth 2 (two) times daily.      . carvedilol (COREG) 6.25 MG tablet Take 3 tablets (18.75 mg total) by mouth 2 (two) times daily with a meal.  30 tablet  0  . chlorhexidine (PERIDEX) 0.12 % solution Use as directed 15 mLs in the mouth or throat 4 (four) times daily.      . cloNIDine (CATAPRES) 0.2 MG tablet Take 1 tablet (0.2 mg total) by mouth 2 (two) times daily.  60 tablet  11  . ferrous sulfate 325 (65 FE) MG EC tablet Take 1 tablet (325 mg total) by mouth 2 (two) times daily.  60 tablet  3  . hydrALAZINE (APRESOLINE) 100 MG tablet Take 100 mg by mouth 3 (three) times daily.      Marland Kitchen ipratropium (ATROVENT) 0.02 % nebulizer solution Take 2.5 mLs (0.5 mg total) by nebulization 4 (four) times daily.  75 mL  12  . isosorbide mononitrate (IMDUR) 30 MG 24 hr tablet Take 1 tablet (30 mg total) by mouth daily.  30 tablet  1  . LORazepam (ATIVAN) 0.5 MG tablet Take 1 tablet (0.5 mg total) by mouth every 6 (six) hours as needed for anxiety.  30 tablet  0  . metoCLOPramide (REGLAN) 5 MG tablet Take 5 mg by mouth every 6 (six) hours.      .  metolazone (ZAROXOLYN) 5 MG tablet Take 1 tablet (5 mg total) by mouth once a week. On Friday  4 tablet  7  . Multiple Vitamin (MULTIVITAMIN WITH MINERALS) TABS tablet Take 1 tablet by mouth daily.      Marland Kitchen oxycodone (OXY-IR) 5 MG capsule Take 5 mg by mouth every 6 (six) hours as needed (for moderate pain).      . pantoprazole (PROTONIX) 20 MG tablet  Take 40 mg by mouth daily.      . potassium chloride (K-DUR,KLOR-CON) 10 MEQ tablet Take 10 mEq by mouth 2 (two) times daily.       . predniSONE (DELTASONE) 10 MG tablet Take 0.5 tablets (5 mg total) by mouth 2 (two) times daily with a meal.      . torsemide (DEMADEX) 20 MG tablet Take 2 tablets (40 mg total) by mouth 2 (two) times daily.  30 tablet  0  . vitamin C (ASCORBIC ACID) 500 MG tablet Take 500 mg by mouth daily.       No current facility-administered medications for this encounter.   Filed Vitals:   03/21/13 1349  BP: 129/52  Pulse: 82  Resp: 20  Weight: 172 lb 12 oz (78.359 kg)  SpO2: 100%   PHYSICAL EXAM: General:  Chronically ill appearing. No resp difficulty; in wheelchair with paramedic,Susie with EMT present and cargiver HEENT: normal; trach Neck: supple. JVP to 9-10  with prominent CV waves. Carotids 2+ bilaterally; no bruits. No lymphadenopathy or thryomegaly appreciated. Cor: PMI normal. Regular rate & rhythm. No rubs, gallops or murmurs. Lungs: diminshed in the bases bilaterally, on 2 liters O2 Abdomen: soft, nontender, nondistended. No hepatosplenomegaly. No bruits or masses. Good bowel sounds. Extremities: no cyanosis, clubbing, rash, trace bilateral edema R>L Neuro: alert & orientedx3, cranial nerves grossly intact. Moves all 4 extremities w/o difficulty. Affect pleasant.   ASSESSMENT & PLAN:  1. CHF: Chronic diastolic HF:  EF 28%55% with moderate to severe LVH and normal RV (02/2013) - NYHA III symptoms. Volume status elevated. Will have the patient take metolazone 2.5 mg today.  . Continue torsemide 40 mg tiwce a day.   Add 5 mg Metolazone once a week.(on Fridays)  - Continue current Coreg 18.75 mg twice a day, , hydralazine 100 mg tid/Imdur 30 mg daily. No ACEI or Cleda DaubSpiro with significant CKD.  - Reinforced the need and importance of daily weights, a low sodium diet, and fluid restriction (less than 2 L a day). Instructed to call the HF clinic if weight increases more than 3 lbs overnight or 5 lbs in a week.   2. Chronic respiratory failure: COPD, OHS/OSA, chronic LLL collapse. Needs ongoing pulmonary follow up.  3. CKD stage IV: Will get BMET next visit.   4. HTN- Stable Continue current regimen.   F/U 6  weeks  Ulla Potashosgrove, Ali B,NP-C 1:59 PM  Patient seen and examined with Ulla PotashAli Cosgrove, NP. Echo images reviewed personally. We discussed all aspects of the encounter. I agree with the assessment and plan as stated above. This is actually the best I have seen [name] in some time. Volume status continues to improve though she remains mildly volume overloaded today. I think Paramedicine program is really helping her. However with her HF, lack of insight and severe lung disease it will be a constant challenge to keep her out of the hospital. Will give her extra dose of metolazone today. Reinforced need for daily weights and reviewed use of sliding scale diuretics.   [place]uka Churchill,MD 10:25 PM

## 2013-04-25 ENCOUNTER — Encounter: Payer: Self-pay | Admitting: Pulmonary Disease

## 2013-04-25 ENCOUNTER — Ambulatory Visit (INDEPENDENT_AMBULATORY_CARE_PROVIDER_SITE_OTHER): Payer: Medicare Other | Admitting: Pulmonary Disease

## 2013-04-25 VITALS — BP 132/68 | HR 67 | Temp 99.0°F | Ht 64.0 in | Wt 172.4 lb

## 2013-04-25 DIAGNOSIS — G4733 Obstructive sleep apnea (adult) (pediatric): Secondary | ICD-10-CM

## 2013-04-25 NOTE — Patient Instructions (Signed)
Continue to unplug trach at night while sleeping, and also with naps during day.  This will completely treat your sleep apnea. Work on weight loss.  You may be able to get rid of your tracheostomy if you are successful. No further followup needed with me.

## 2013-04-25 NOTE — Progress Notes (Signed)
Subjective:    Patient ID: [name], female    DOB: 1940-06-13, 73 y.o.   MRN: [id number]  HPI The patient is a 73 year old female who I've been asked to see for management of obstructive sleep apnea. She also has a history of chronic respiratory failure secondary to chronic diastolic heart failure, and currently is being seen closely by cardiology. The patient had a sleep study in 2010 that showed severe OSA, with an AHI of 81 events per hour. She was treated with bilevel during the night, with an optimal pressure of 22/17. She was followed by Dr. Vassie LollAlva in our office, but was lost to f/u.  She subsequently had a tracheostomy after an episode of chronic respiratory failure, and has remained on a trach since that time. She currently unplugged her trach at night while sleeping and with naps, and uses a Passy-Muir valve during the day. She feels that she sleeps well, but does have some nocturia related to her diuretic therapy. She feels that she is alert during the day and does not fall asleep easily. She is satisfied with her daytime alertness at this time. The patient thinks that she is a little heavier now than 2 years ago, and her Epworth score today is only 3.   Sleep Questionnaire What time do you typically go to bed?( Between what hours) 11p 11p at 1124 on 04/25/13 by Maisie FusAshtyn M Green, CMA How long does it take you to fall asleep? not sure not sure at 1124 on 04/25/13 by Maisie FusAshtyn M Green, CMA How many times during the night do you wake up? No Value several at 1124 on 04/25/13 by Maisie FusAshtyn M Green, CMA What time do you get out of bed to start your day? 0930 0930 at 1124 on 04/25/13 by Maisie FusAshtyn M Green, CMA Do you drive or operate heavy machinery in your occupation? No No at 1124 on 04/25/13 by Maisie FusAshtyn M Green, CMA How much has your weight changed (up or down) over the past two years? (In pounds) No Value varies +/- at 1124 on 04/25/13 by Maisie FusAshtyn M Green, CMA Have you ever had a sleep  study before? Yes Yes at 1124 on 04/25/13 by Maisie FusAshtyn M Green, CMA If yes, location of study? [facility] at 1124 on 04/25/13 by Maisie FusAshtyn M Green, CMA If yes, date of study? 2010 2010 at 1124 on 04/25/13 by Maisie FusAshtyn M Green, CMA Do you currently use CPAP? No No at 1124 on 04/25/13 by Maisie FusAshtyn M Green, CMA Do you wear oxygen at any time? Yes Yes at 1124 on 04/25/13 by Maisie FusAshtyn M Green, CMA O2 Flow Rate (L/min) 4 L/min4 L/min 24/7 at 1124 on 04/25/13 by Maisie FusAshtyn M Green, CMA   Review of Systems  Constitutional: Negative for fever and unexpected weight change.  HENT: Positive for postnasal drip and rhinorrhea. Negative for congestion, dental problem, ear pain, nosebleeds, sinus pressure, sneezing, sore throat and trouble swallowing.   Eyes: Negative for redness and itching.  Respiratory: Positive for cough, shortness of breath and wheezing. Negative for chest tightness.   Cardiovascular: Positive for palpitations and leg swelling.  Gastrointestinal: Negative for nausea and vomiting.  Genitourinary: Negative for dysuria.  Musculoskeletal: Negative for joint swelling.  Skin: Negative for rash.  Neurological: Negative for headaches.  Hematological: Does not bruise/bleed easily.  Psychiatric/Behavioral: Negative for dysphoric mood. The patient is not nervous/anxious.        Objective:   Physical Exam Constitutional: obese female, no acute distress  HENT:  Nares  patent without discharge  Oropharynx without exudate, palate and uvula are elongated.   Trach in place with clean site.   Eyes:  Perrla, eomi, no scleral icterus  Neck:  No JVD, no TMG  Cardiovascular:  Normal rate, regular rhythm, no rubs or gallops.  2/6 sem        Intact distal pulses but diminished.  Pulmonary :  Normal breath sounds, no stridor or respiratory distress   No rales, rhonchi, or wheezing.  Mild upper airway noise on expiration.   Abdominal:  Soft, nondistended, bowel sounds present.  No tenderness noted.    Musculoskeletal:  1+ lower extremity edema noted.  Lymph Nodes:  No cervical lymphadenopathy noted  Skin:  No cyanosis noted  Neurologic:  Alert, appropriate, moves all 4 extremities without obvious deficit.         Assessment & Plan:

## 2013-04-25 NOTE — Assessment & Plan Note (Addendum)
The patient has a history of very severe obstructive sleep apnea, but currently has a tracheostomy that she unplugs at night and during naps, and uses a Passy-Muir valve during the day. I have explained to her this will totally treat her obstructive sleep apnea, and that if she is able to lose weight successfully, we may be able to remove her tracheostomy? I have asked her to continue with her current trach management, and to work aggressively on weight loss.

## 2013-05-02 ENCOUNTER — Ambulatory Visit (HOSPITAL_COMMUNITY)
Admission: RE | Admit: 2013-05-02 | Discharge: 2013-05-02 | Disposition: A | Discharge status: Home / Self Care | Payer: Medicare Other | Source: Ambulatory Visit | Attending: Internal Medicine | Admitting: Internal Medicine

## 2013-05-02 VITALS — BP 160/72 | HR 71 | Wt 171.0 lb

## 2013-05-02 DIAGNOSIS — I5032 Chronic diastolic (congestive) heart failure: Secondary | ICD-10-CM | POA: Insufficient documentation

## 2013-05-02 DIAGNOSIS — N184 Chronic kidney disease, stage 4 (severe): Secondary | ICD-10-CM | POA: Insufficient documentation

## 2013-05-02 DIAGNOSIS — E662 Morbid (severe) obesity with alveolar hypoventilation: Secondary | ICD-10-CM

## 2013-05-02 LAB — BASIC METABOLIC PANEL
BUN: 66 mg/dL — ABNORMAL HIGH (ref 6–23)
CO2: 26 mEq/L (ref 19–32)
Calcium: 9.2 mg/dL (ref 8.4–10.5)
Chloride: 103 mEq/L (ref 96–112)
Creatinine, Ser: 3.11 mg/dL — ABNORMAL HIGH (ref 0.50–1.10)
GFR calc non Af Amer: 14 mL/min — ABNORMAL LOW (ref >90)
GFR, EST AFRICAN AMERICAN: 16 mL/min — AB (ref >90)
Glucose, Bld: 69 mg/dL — ABNORMAL LOW (ref 70–99)
POTASSIUM: 3.8 meq/L (ref 3.7–5.3)
SODIUM: 144 meq/L (ref 137–147)

## 2013-05-02 LAB — PRO B NATRIURETIC PEPTIDE: PRO B NATRI PEPTIDE: 1474 pg/mL — AB (ref 0–125)

## 2013-05-02 NOTE — Patient Instructions (Signed)
Labs today  Your physician recommends that you schedule a follow-up appointment in: 1 month  

## 2013-05-02 NOTE — Progress Notes (Signed)
Patient ID: [name], female   DOB: [birth year], 73 y.o.   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 More recent cardiac history includes asystolic cardiac arrest 02/2012 in the setting of a/c respiratory failure, then NSTEMI/NSVT 06/2012 (felt type II, cath deferred due to CKD, started on Plavix).  [facility] 09/13/11: mild pulmonary hypertension mean PA 28 with PVR 3, otherwise normal right heart pressures  2D echo 06/2012: EF 45%, severe concentric LVH, hypokinesis of inferolateral, inferior, and inferoseptal myocardium, grade 2 diastolic dysfunction, mild AS, moderately dilated LA/RA, mild-mod reduced RV systolic function, PA pressure , trivial pericardial effusion was identified posterior to the heart  Admitted to the hospital 10/06/2012 with complaints of SOB. This was her 10th admission over the last 1 year. She was diuresed with IV lasix and had a net negative of -15 liters and a discharge weight of 172 lbs. She has multiple issues with transportation to clinic appointments and has been frequently a no show.  Nephrology has seen patient; she is not a dialysis candidate. Family rejected SNF placement.   Admitted 10/14 with acute/chronic respiratory failure. Paced on antibiotics. D/C creatinine 3.6 . D/C weight 176 pounds.   Admitted 11/14. Diuresed with IV lasix.and transitioned to 40 mg torsemide twice a day.  Discharge weight 176 pounds.   Echo 03/21/13  EF 55% moderate to severe LVH. RV ok.   Follow up:  She is now followed by paramedicine and is doing a better job of making it to her clinic appointments.   Symptomatically, she is stable.  She uses a wheelchair for most mobility, walking only from bed to bathroom.  She does get short of  breath when she walks around her house, but this is actually somewhat improved.  She wears home oxygen for OHS/OSA.  No chest pain. BP is high but she has not taken any of her morning meds.  When she is at home and on her meds, BP seems to be controlled (she brings log book with her).   Weight is stable.   Labs (11/14): K 4.8, creatinine 3.58  SH: Lives with daughter who is on dialysis.  No smoking.   ROS: All systems negative except as listed in HPI, PMH and Problem List.  Past Medical History  Diagnosis Date  . COPD (chronic obstructive pulmonary disease)     on home o2  . CVA (cerebral infarction)     hx  . Hypertensive heart disease with congestive heart failure   . Chronic cor pulmonale   . Chronic kidney disease (CKD), stage IV (severe)   . Hyperlipdemia   . Obesity (BMI 30-39.9)   . OSA (obstructive sleep apnea)     Does not use CPAP  . History of gout 09/07/2011  . Chronic combined systolic and diastolic CHF (congestive heart failure) 7/13    a. With cor pulmonale - EF 45-50%.  . Anemia   . Vascular disease   . Asthma   . Hypertension   . Diabetes mellitus   . GERD (gastroesophageal reflux disease)   . Arthritis   . RBBB   . Thrombocytopenia   . Acute and chronic respiratory failure     a. Trache placed 11/2011 for recurrent intubation (hypercarbic  resp failure), post-intubation vocal cord edema, several issues since that time wih trache. b. On home O2 with trache.  . Obesity, unspecified 08/28/2012  . Obesity hypoventilation syndrome   . Lung collapse     a. Chronic LLL collapse.  . Cardiac arrest     a. Asystolic cardiac arrest 02/2012 in setting of a/c respiratory failure (ischemic w/u has been deferred due to worsening CKD).  . NSTEMI (non-ST elevated myocardial infarction)     a. 06/2012 felt type II - troponin peak 1.45 - ischemic eval has been deferred due to CKD. Started on Plavix.  . Pulmonary HTN     a. mild pulm HTN by [facility] 08/2011, felt due to lung disease. b.  PA pressure 63mmHg by echo 06/2012.  Marland Kitchen. NSVT (nonsustained ventricular tachycardia)     a. Noted on tele, admission in 06/2012.  Marland Kitchen. Cirrhosis     a. Suspected on abd CT 12/2011.    Current Outpatient Prescriptions  Medication Sig Dispense Refill  . albuterol (PROVENTIL) (2.5 MG/3ML) 0.083% nebulizer solution Take 2.5 mg by nebulization every 6 (six) hours as needed for wheezing.      Marland Kitchen. amLODipine (NORVASC) 10 MG tablet Take 10 mg by mouth daily.      Marland Kitchen. aspirin 81 MG chewable tablet Chew 1 tablet (81 mg total) by mouth daily.      Marland Kitchen. atorvastatin (LIPITOR) 40 MG tablet Take 1 tablet (40 mg total) by mouth daily.  30 tablet  1  . busPIRone (BUSPAR) 5 MG tablet Take 5 mg by mouth 2 (two) times daily.      . carvedilol (COREG) 6.25 MG tablet Take 6.25 mg by mouth 2 (two) times daily with a meal.      . cloNIDine (CATAPRES) 0.2 MG tablet Take 0.1 mg by mouth 2 (two) times daily.      . hydrALAZINE (APRESOLINE) 100 MG tablet Take 100 mg by mouth 3 (three) times daily.      . isosorbide mononitrate (IMDUR) 30 MG 24 hr tablet Take 1 tablet (30 mg total) by mouth daily.  30 tablet  1  . LORazepam (ATIVAN) 0.5 MG tablet Take 1 tablet (0.5 mg total) by mouth every 6 (six) hours as needed for anxiety.  30 tablet  0  . metolazone (ZAROXOLYN) 5 MG tablet Take 1 tablet (5 mg total) by mouth once a week. On Friday  5 tablet  7  . Multiple Vitamin (MULTIVITAMIN WITH MINERALS) TABS tablet Take 1 tablet by mouth daily.      . pantoprazole (PROTONIX) 20 MG tablet Take 40 mg by mouth daily.      . potassium chloride (K-DUR,KLOR-CON) 10 MEQ tablet Take 10 mEq by mouth 2 (two) times daily.       Marland Kitchen. torsemide (DEMADEX) 20 MG tablet Take 2 tablets (40 mg total) by mouth 2 (two) times daily.  30 tablet  0  . vitamin C (ASCORBIC ACID) 500 MG tablet Take 500 mg by mouth daily.       No current facility-administered medications for this encounter.   Filed Vitals:   05/02/13 1146  BP: 160/72  Pulse: 71  Weight: 171 lb  (77.565 kg)  SpO2: 100%   PHYSICAL EXAM: General:  Chronically ill appearing. No resp difficulty; in wheelchair with paramedic,[name] with EMT present and caregiver HEENT: normal; trach Neck: supple. No JVD. Carotids 2+ bilaterally; no bruits. No lymphadenopathy or thryomegaly appreciated. Cor: PMI normal. Regular rate & rhythm. No rubs, gallops or murmurs. Lungs:  Occasional rhonchi, on 2 liters O2 Abdomen: soft, nontender, nondistended. No hepatosplenomegaly. No bruits or masses. Good bowel sounds. Extremities: no cyanosis, clubbing, rash,1+ bilateral ankle edema. Neuro: alert & orientedx3, cranial nerves grossly intact. Moves all 4 extremities w/o difficulty. Affect pleasant.  ASSESSMENT & PLAN:  1. Chronic diastolic HF:  EF 60% with moderate to severe LVH and normal RV (02/2013 echo).  Stable NYHA III symptoms. Volume actually appears to be near her baseline.  - Continue torsemide 40 mg twice a day with metolazone once a week. - BMET today.  - Reinforced the need and importance of daily weights, a low sodium diet, and fluid restriction (less than 2 L a day). Instructed to call the HF clinic if weight increases more than 3 lbs overnight or 5 lbs in a week.   2. Chronic respiratory failure: COPD, OHS/OSA, chronic LLL collapse. Needs ongoing pulmonary follow up. She is on home oxygen via a trach.  3. CKD stage IV: Will get BMET today.   4. HTN: Stable.  Continue current regimen.  BP high this morning but did not take morning meds before appointment.  On paramedicine records, BP has been within normal range.   F/U 1 month.   [name] 05/02/2013

## 2013-05-07 ENCOUNTER — Telehealth (HOSPITAL_COMMUNITY): Payer: Self-pay | Admitting: *Deleted

## 2013-05-07 NOTE — Telephone Encounter (Signed)
Received call from GypsumJessica, [facility] she states pt wt was 168.6 today and yesterday it was 164.8, so its up about 4 lbs she states pt does have increased edema in feet but breathing seems to be the same for pt, she states she fills her pill box weekly and pt has been taking them including metolazone on Friday, she will take extra Metolazone today and continue to monitor will c/b as needed

## 2013-05-31 ENCOUNTER — Ambulatory Visit (HOSPITAL_COMMUNITY)
Admission: RE | Admit: 2013-05-31 | Discharge: 2013-05-31 | Disposition: A | Discharge status: Home / Self Care | Payer: Medicare Other | Source: Ambulatory Visit | Attending: Internal Medicine | Admitting: Internal Medicine

## 2013-05-31 ENCOUNTER — Encounter (HOSPITAL_COMMUNITY): Payer: Self-pay

## 2013-05-31 ENCOUNTER — Encounter: Payer: Self-pay | Admitting: Licensed Clinical Social Worker

## 2013-05-31 VITALS — BP 144/82 | HR 71 | Resp 18 | Wt 185.4 lb

## 2013-05-31 DIAGNOSIS — I252 Old myocardial infarction: Secondary | ICD-10-CM | POA: Insufficient documentation

## 2013-05-31 DIAGNOSIS — Z79899 Other long term (current) drug therapy: Secondary | ICD-10-CM | POA: Insufficient documentation

## 2013-05-31 DIAGNOSIS — J4489 Other specified chronic obstructive pulmonary disease: Secondary | ICD-10-CM | POA: Insufficient documentation

## 2013-05-31 DIAGNOSIS — E119 Type 2 diabetes mellitus without complications: Secondary | ICD-10-CM | POA: Insufficient documentation

## 2013-05-31 DIAGNOSIS — J449 Chronic obstructive pulmonary disease, unspecified: Secondary | ICD-10-CM | POA: Insufficient documentation

## 2013-05-31 DIAGNOSIS — Z8674 Personal history of sudden cardiac arrest: Secondary | ICD-10-CM | POA: Insufficient documentation

## 2013-05-31 DIAGNOSIS — Z7982 Long term (current) use of aspirin: Secondary | ICD-10-CM | POA: Insufficient documentation

## 2013-05-31 DIAGNOSIS — Z8673 Personal history of transient ischemic attack (TIA), and cerebral infarction without residual deficits: Secondary | ICD-10-CM | POA: Insufficient documentation

## 2013-05-31 DIAGNOSIS — J9610 Chronic respiratory failure, unspecified whether with hypoxia or hypercapnia: Secondary | ICD-10-CM | POA: Insufficient documentation

## 2013-05-31 DIAGNOSIS — Z930 Tracheostomy status: Secondary | ICD-10-CM | POA: Insufficient documentation

## 2013-05-31 DIAGNOSIS — E669 Obesity, unspecified: Secondary | ICD-10-CM | POA: Insufficient documentation

## 2013-05-31 DIAGNOSIS — I5032 Chronic diastolic (congestive) heart failure: Secondary | ICD-10-CM | POA: Insufficient documentation

## 2013-05-31 DIAGNOSIS — K219 Gastro-esophageal reflux disease without esophagitis: Secondary | ICD-10-CM | POA: Insufficient documentation

## 2013-05-31 DIAGNOSIS — I509 Heart failure, unspecified: Secondary | ICD-10-CM | POA: Insufficient documentation

## 2013-05-31 DIAGNOSIS — E662 Morbid (severe) obesity with alveolar hypoventilation: Secondary | ICD-10-CM | POA: Insufficient documentation

## 2013-05-31 DIAGNOSIS — I129 Hypertensive chronic kidney disease with stage 1 through stage 4 chronic kidney disease, or unspecified chronic kidney disease: Secondary | ICD-10-CM | POA: Insufficient documentation

## 2013-05-31 DIAGNOSIS — N184 Chronic kidney disease, stage 4 (severe): Secondary | ICD-10-CM

## 2013-05-31 DIAGNOSIS — Z9981 Dependence on supplemental oxygen: Secondary | ICD-10-CM | POA: Insufficient documentation

## 2013-05-31 DIAGNOSIS — Z791 Long term (current) use of non-steroidal anti-inflammatories (NSAID): Secondary | ICD-10-CM | POA: Insufficient documentation

## 2013-05-31 DIAGNOSIS — N189 Chronic kidney disease, unspecified: Secondary | ICD-10-CM | POA: Insufficient documentation

## 2013-05-31 DIAGNOSIS — G4733 Obstructive sleep apnea (adult) (pediatric): Secondary | ICD-10-CM | POA: Insufficient documentation

## 2013-05-31 NOTE — Patient Instructions (Signed)
Doing great!!!!!!!  Follow up in 6 weeks, if having any issues call [phone]  Will get Kidney appointment  Do the following things EVERYDAY: 1) Weigh yourself in the morning before breakfast. Write it down and keep it in a log. 2) Take your medicines as prescribed 3) Eat low salt foods-Limit salt (sodium) to 2000 mg per day.  4) Stay as active as you can everyday 5) Limit all fluids for the day to less than 2 liters 6)

## 2013-05-31 NOTE — Progress Notes (Signed)
CSW and paramedicine Sherral Hammers met with patient in the clinic. Patient with smile on her face stated that she is doing well. Patient has good support from her daughter and denies any concerns at this time. Patient reports that she has not heard anything back from the meals on wheels home assessment completed a few weeks ago. Paramedicine will follow up with [facility] referral. Patient denies any concerns at present with meals and states she is managing at the moment. Patient verbalizes understanding of visit and will call for assistance if needed. CSW will continue to be available as needed. Raquel Sarna, [place]

## 2013-05-31 NOTE — Progress Notes (Signed)
Patient ID: [name], female   DOB: 07/28/1940, 73 y.o.   MRN: [id number]  PCP: Dr. Baird Cancer ([place]) Pulmonologist: Dr. Gwenette Greet Nephrologist: Dr. 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 More recent cardiac history includes asystolic cardiac arrest 08/8293 in the setting of a/c respiratory failure, then NSTEMI/NSVT 06/2012 (felt type II, cath deferred due to CKD, started on Plavix).  [place] 09/13/11: mild pulmonary hypertension mean PA 28 with PVR 3, otherwise normal right heart pressures  2D echo 06/2012: EF 45%, severe concentric LVH, hypokinesis of inferolateral, inferior, and inferoseptal myocardium, grade 2 diastolic dysfunction, mild AS, moderately dilated LA/RA, mild-mod reduced RV systolic function, PA pressure 30mHg, trivial pericardial effusion was identified posterior to the heart  Admitted to the hospital 10/06/2012 with complaints of SOB. This was her 10th admission over the last 1 year. She was diuresed with IV lasix and had a net negative of -15 liters and a discharge weight of 172 lbs. She has multiple issues with transportation to clinic appointments and has been frequently a no show.  Nephrology has seen patient; she is not a dialysis candidate. Family rejected SNF placement.   Admitted 10/14 with acute/chronic respiratory failure. Paced on antibiotics. D/C creatinine 3.6 . D/C weight 176 pounds.   Admitted 11/14. Diuresed with IV lasix.and transitioned to 40 mg torsemide twice a day.  Discharge weight 176 pounds.   Echo 03/21/13  EF 55% moderate to severe LVH. RV ok.   Follow up:  Since last visit needed extra metolazone d/t weight increase. Doing well. Denies SOB, PND, CP or edema. In her wheelchair most of the day, but is trying to increase walking. +DOE with  minimal exertion. Wears home O2 3L. She is now followed by paramedicine and is doing a better job of making it to her clinic appointments.  Weight stable 168-172 lbs. No dizziness. Following a low salt diet and drinking less than 2L a day. Has not taken morning medications yet.    Labs (11/14): K 4.8, creatinine 3.58  SH: Lives with daughter who is on dialysis.  No smoking.   ROS: All systems negative except as listed in HPI, PMH and Problem List.  Past Medical History  Diagnosis Date  . COPD (chronic obstructive pulmonary disease)     on home o2  . CVA (cerebral infarction)     hx  . Hypertensive heart disease with congestive heart failure   . Chronic cor pulmonale   . Chronic kidney disease (CKD), stage IV (severe)   . Hyperlipdemia   . Obesity (BMI 30-39.9)   . OSA (obstructive sleep apnea)     Does not use CPAP  . History of gout 09/07/2011  . Chronic combined systolic and diastolic CHF (congestive heart failure) 7/13    a. With cor pulmonale - EF 45-50%.  . Anemia   . Vascular disease   . Asthma   . Hypertension   . Diabetes mellitus   . GERD (gastroesophageal reflux disease)   . Arthritis   . RBBB   . Thrombocytopenia   . Acute and chronic respiratory failure     a. Trache placed 11/2011 for recurrent intubation (hypercarbic resp failure), post-intubation vocal cord edema, several issues since that time wih trache. b. On home O2 with trache.  . Obesity, unspecified  08/28/2012  . Obesity hypoventilation syndrome   . Lung collapse     a. Chronic LLL collapse.  . Cardiac arrest     a. Asystolic cardiac arrest 10/3233 in setting of a/c respiratory failure (ischemic w/u has been deferred due to worsening CKD).  . NSTEMI (non-ST elevated myocardial infarction)     a. 06/2012 felt type II - troponin peak 1.45 - ischemic eval has been deferred due to CKD. Started on Plavix.  . Pulmonary HTN     a. mild pulm HTN by [place] 08/2011, felt due to lung disease. b. PA pressure 41mHg by  echo 06/2012.  .Marland KitchenNSVT (nonsustained ventricular tachycardia)     a. Noted on tele, admission in 06/2012.  .Marland KitchenCirrhosis     a. Suspected on abd CT 12/2011.    Current Outpatient Prescriptions  Medication Sig Dispense Refill  . albuterol (PROVENTIL) (2.5 MG/3ML) 0.083% nebulizer solution Take 2.5 mg by nebulization every 6 (six) hours as needed for wheezing.      .Marland KitchenamLODipine (NORVASC) 10 MG tablet Take 10 mg by mouth daily.      .Marland Kitchenaspirin 81 MG chewable tablet Chew 1 tablet (81 mg total) by mouth daily.      .Marland Kitchenatorvastatin (LIPITOR) 40 MG tablet Take 1 tablet (40 mg total) by mouth daily.  30 tablet  1  . busPIRone (BUSPAR) 5 MG tablet Take 5 mg by mouth 2 (two) times daily.      . carvedilol (COREG) 6.25 MG tablet Take 6.25 mg by mouth 2 (two) times daily with a meal.      . cloNIDine (CATAPRES) 0.2 MG tablet Take 0.1 mg by mouth 2 (two) times daily.      . hydrALAZINE (APRESOLINE) 100 MG tablet Take 100 mg by mouth 3 (three) times daily.      . isosorbide mononitrate (IMDUR) 30 MG 24 hr tablet Take 1 tablet (30 mg total) by mouth daily.  30 tablet  1  . LORazepam (ATIVAN) 0.5 MG tablet Take 1 tablet (0.5 mg total) by mouth every 6 (six) hours as needed for anxiety.  30 tablet  0  . metolazone (ZAROXOLYN) 5 MG tablet Take 1 tablet (5 mg total) by mouth once a week. On Friday  5 tablet  7  . Multiple Vitamin (MULTIVITAMIN WITH MINERALS) TABS tablet Take 1 tablet by mouth daily.      . pantoprazole (PROTONIX) 20 MG tablet Take 40 mg by mouth daily.      . potassium chloride (K-DUR,KLOR-CON) 10 MEQ tablet Take 10 mEq by mouth 2 (two) times daily.       .Marland Kitchentorsemide (DEMADEX) 20 MG tablet Take 2 tablets (40 mg total) by mouth 2 (two) times daily.  30 tablet  0  . vitamin C (ASCORBIC ACID) 500 MG tablet Take 500 mg by mouth daily.       No current facility-administered medications for this encounter.   Filed Vitals:   05/31/13 1149  BP: 144/82  Pulse: 71  Resp: 18  Weight: 185 lb 6 oz (84.086  kg)  SpO2: 100%   PHYSICAL EXAM: General:  Chronically ill appearing. No resp difficulty; in wheelchair with paramedic,Susie with EMT present and caregiver HEENT: normal; trach Neck: supple. JVD 8. Carotids 2+ bilaterally; no bruits. No lymphadenopathy or thryomegaly appreciated. Cor: PMI normal. Regular rate & rhythm. No rubs, gallops or murmurs. Lungs: Scattered rhonchi throughout, on 2 liters O2 Abdomen: soft, nontender, nondistended. No hepatosplenomegaly. No bruits or masses. Good  bowel sounds. Extremities: no cyanosis, clubbing, rash,1+ bilateral ankle edema. Neuro: alert & orientedx3, cranial nerves grossly intact. Moves all 4 extremities w/o difficulty. Affect pleasant.  ASSESSMENT & PLAN:  1. Chronic diastolic HF:  EF 87% with moderate to severe LVH and normal RV (02/2013 echo).   - Stable NYHA III symptoms and volume status stable. Will continue torsemide 40 mg BID with metolazone 5 mg every Friday. Instructed to call if weight starts trending up.  - SBP elevated, but she did not take am meds yet. - Reinforced the need and importance of daily weights, a low sodium diet, and fluid restriction (less than 2 L a day). Instructed to call the HF clinic if weight increases more than 3 lbs overnight or 5 lbs in a week.   2. Chronic respiratory failure: COPD, OHS/OSA, chronic LLL collapse. Needs ongoing pulmonary follow up. She is on home oxygen via a trach.  3. CKD stage IV: Last Cr 3.11. Reports she used to go to Dr. Justin Mend, but has not seen them in awhile. Instructed to call and make an appointment that she needs to reestablish care.   4. HTN: Stable. Continue current meds. Did not take am meds yet, will continue to follow.   Paramedicine program still active with patient and CSW met with her today to help see if we could get meals on wheels for the patient.   F/U 6 weeks Rande Brunt 05/31/2013

## 2013-07-10 NOTE — Progress Notes (Addendum)
Patient ID: [name], female   DOB: 10/13/1940, 73 y.o.   MRN: [id number]  PCP: Dr. Baird Cancer ([place]) Pulmonologist: Dr. Gwenette Greet Nephrologist: Dr. Justin Mend  HPI: Ms. 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 More recent cardiac history includes asystolic cardiac arrest 07/8113 in the setting of a/c respiratory failure, then NSTEMI/NSVT 06/2012 (felt type II, cath deferred due to CKD, started on Plavix).  [place] 09/13/11: mild pulmonary hypertension mean PA 28 with PVR 3, otherwise normal right heart pressures  2D echo 06/2012: EF 45%, severe concentric LVH, hypokinesis of inferolateral, inferior, and inferoseptal myocardium, grade 2 diastolic dysfunction, mild AS, moderately dilated LA/RA, mild-mod reduced RV systolic function, PA pressure 56mHg, trivial pericardial effusion was identified posterior to the heart  Admitted to the hospital 10/06/2012 with complaints of SOB. This was her 10th admission over the last 1 year. She was diuresed with IV lasix and had a net negative of -15 liters and a discharge weight of 172 lbs. She has multiple issues with transportation to clinic appointments and has been frequently a no show.  Nephrology has seen patient; she is not a dialysis candidate. Family rejected SNF placement.   Admitted 10/14 with acute/chronic respiratory failure. Paced on antibiotics. D/C creatinine 3.6 . D/C weight 176 pounds.   Admitted 11/14. Diuresed with IV lasix.and transitioned to 40 mg torsemide twice a day.  Discharge weight 176 pounds.   Echo 03/21/13  EF 55% moderate to severe LVH. RV ok.   Follow up: Doing well. Going to see Dr. WJustin Mendnext week for follow up. +SOB with minimal exertion and orthopnea. Denies CP or LE edema.  In her wheelchair most of the day, but is trying to increase  walking around the house. Wears home O2 3L. Weight at home 166-172 lbs. Following a low salt diet and drinking less than 2L a day.   Labs (11/14): K 4.8, creatinine 3.58        (04/2013): K+ 3.8, creatinine 3.11, pro-BNP 1474   SH: Lives with daughter who is on dialysis.  No smoking.   ROS: All systems negative except as listed in HPI, PMH and Problem List.  Past Medical History  Diagnosis Date  . COPD (chronic obstructive pulmonary disease)     on home o2  . CVA (cerebral infarction)     hx  . Hypertensive heart disease with congestive heart failure   . Chronic cor pulmonale   . Chronic kidney disease (CKD), stage IV (severe)   . Hyperlipdemia   . Obesity (BMI 30-39.9)   . OSA (obstructive sleep apnea)     Does not use CPAP  . History of gout 09/07/2011  . Chronic combined systolic and diastolic CHF (congestive heart failure) 7/13    a. With cor pulmonale - EF 45-50%.  . Anemia   . Vascular disease   . Asthma   . Hypertension   . Diabetes mellitus   . GERD (gastroesophageal reflux disease)   . Arthritis   . RBBB   . Thrombocytopenia   . Acute and chronic respiratory failure     a. Trache placed 11/2011 for recurrent intubation (hypercarbic resp failure), post-intubation vocal cord edema, several issues since that time wih trache. b. On home O2 with trache.  . Obesity, unspecified 08/28/2012  . Obesity hypoventilation syndrome   .  Lung collapse     a. Chronic LLL collapse.  . Cardiac arrest     a. Asystolic cardiac arrest 02/256 in setting of a/c respiratory failure (ischemic w/u has been deferred due to worsening CKD).  . NSTEMI (non-ST elevated myocardial infarction)     a. 06/2012 felt type II - troponin peak 1.45 - ischemic eval has been deferred due to CKD. Started on Plavix.  . Pulmonary HTN     a. mild pulm HTN by [place] 08/2011, felt due to lung disease. b. PA pressure 71mHg by echo 06/2012.  .Marland KitchenNSVT (nonsustained ventricular tachycardia)     a. Noted on tele, admission  in 06/2012.  .Marland KitchenCirrhosis     a. Suspected on abd CT 12/2011.    Current Outpatient Prescriptions  Medication Sig Dispense Refill  . albuterol (PROVENTIL) (2.5 MG/3ML) 0.083% nebulizer solution Take 2.5 mg by nebulization every 6 (six) hours as needed for wheezing.      .Marland KitchenamLODipine (NORVASC) 10 MG tablet Take 10 mg by mouth daily.      .Marland Kitchenaspirin 81 MG chewable tablet Chew 1 tablet (81 mg total) by mouth daily.      .Marland Kitchenatorvastatin (LIPITOR) 40 MG tablet Take 1 tablet (40 mg total) by mouth daily.  30 tablet  1  . busPIRone (BUSPAR) 5 MG tablet Take 5 mg by mouth 2 (two) times daily.      . carvedilol (COREG) 6.25 MG tablet Take 6.25 mg by mouth 2 (two) times daily with a meal.      . cloNIDine (CATAPRES) 0.2 MG tablet Take 0.1 mg by mouth 2 (two) times daily.      . hydrALAZINE (APRESOLINE) 100 MG tablet Take 100 mg by mouth 3 (three) times daily.      . isosorbide mononitrate (IMDUR) 30 MG 24 hr tablet Take 1 tablet (30 mg total) by mouth daily.  30 tablet  1  . LORazepam (ATIVAN) 0.5 MG tablet Take 1 tablet (0.5 mg total) by mouth every 6 (six) hours as needed for anxiety.  30 tablet  0  . metolazone (ZAROXOLYN) 5 MG tablet Take 1 tablet (5 mg total) by mouth once a week. On Friday  5 tablet  7  . Multiple Vitamin (MULTIVITAMIN WITH MINERALS) TABS tablet Take 1 tablet by mouth daily.      . pantoprazole (PROTONIX) 20 MG tablet Take 40 mg by mouth daily.      . potassium chloride (K-DUR,KLOR-CON) 10 MEQ tablet Take 10 mEq by mouth 2 (two) times daily.       .Marland Kitchentorsemide (DEMADEX) 20 MG tablet Take 2 tablets (40 mg total) by mouth 2 (two) times daily.  30 tablet  0  . vitamin C (ASCORBIC ACID) 500 MG tablet Take 500 mg by mouth daily.       No current facility-administered medications for this encounter.   Filed Vitals:   07/11/13 1151  BP: 160/71  Pulse: 70  Weight: 176 lb 2 oz (79.89 kg)  SpO2: 100%   PHYSICAL EXAM: General:  Chronically ill appearing. No resp difficulty; in  wheelchair, caregiver present HEENT: normal; trach Neck: supple. JVD 9-10 Carotids 2+ bilaterally; no bruits. No lymphadenopathy or thryomegaly appreciated. Cor: PMI normal. Regular rate & rhythm. No rubs or gallops. +systolic murmur Lungs: Scattered rhonchi throughout, on 2 liters O2 Abdomen: soft, nontender, nondistended. No hepatosplenomegaly. No bruits or masses. Good bowel sounds. Extremities: no cyanosis, clubbing, rash,1+ bilateral ankle edema. Neuro: alert & orientedx3, cranial nerves  grossly intact. Moves all 4 extremities w/o difficulty. Affect pleasant.  ASSESSMENT & PLAN:  1. Chronic diastolic HF:  EF 09% with moderate to severe LVH and normal RV (02/2013 echo).   - Stable NYHA III symptoms and volume slightly elevated. Will have her increase her torsemide to 60 mg q am and 40 mg q pm for 3 days and then go back to 40 mg BID. Continue metolazone 5 mg q Friday. - Continue potassium 10 meq BID but for the next 3 days take 30 meq. - SBP remains elevated will increase hydralazine to 150 mg TID. - Reinforced the need and importance of daily weights, a low sodium diet, and fluid restriction (less than 2 L a day). Instructed to call the HF clinic if weight increases more than 3 lbs overnight or 5 lbs in a week.   2. Chronic respiratory failure: COPD, OHS/OSA, chronic LLL collapse. Needs ongoing pulmonary follow up. She is on home oxygen via a trach. She reports having issues with her trach and instructed to call pulmonology. 3. CKD stage IV: baseline Cr 3.1-3.6. Has follow up with Dr. Justin Mend next week. Will check BMET today. 4. HTN: Elevated, as above will increase hydralazine.  Paramedicine program still active with patient and CSW met with her today to help see if we could get meals on wheels for the patient.   F/U 5 weeks with MD.  Rande Brunt 07/11/2013

## 2013-07-11 ENCOUNTER — Ambulatory Visit (HOSPITAL_COMMUNITY)
Admission: RE | Admit: 2013-07-11 | Discharge: 2013-07-11 | Disposition: A | Discharge status: Home / Self Care | Payer: Medicare Other | Source: Ambulatory Visit | Attending: Cardiology | Admitting: Cardiology

## 2013-07-11 ENCOUNTER — Encounter (HOSPITAL_COMMUNITY): Payer: Self-pay

## 2013-07-11 VITALS — BP 160/71 | HR 70 | Wt 176.1 lb

## 2013-07-11 DIAGNOSIS — I498 Other specified cardiac arrhythmias: Secondary | ICD-10-CM | POA: Insufficient documentation

## 2013-07-11 DIAGNOSIS — K219 Gastro-esophageal reflux disease without esophagitis: Secondary | ICD-10-CM | POA: Diagnosis not present

## 2013-07-11 DIAGNOSIS — R0609 Other forms of dyspnea: Secondary | ICD-10-CM

## 2013-07-11 DIAGNOSIS — I4510 Unspecified right bundle-branch block: Secondary | ICD-10-CM | POA: Diagnosis not present

## 2013-07-11 DIAGNOSIS — Z6830 Body mass index (BMI) 30.0-30.9, adult: Secondary | ICD-10-CM | POA: Diagnosis not present

## 2013-07-11 DIAGNOSIS — J9610 Chronic respiratory failure, unspecified whether with hypoxia or hypercapnia: Secondary | ICD-10-CM | POA: Diagnosis not present

## 2013-07-11 DIAGNOSIS — R0989 Other specified symptoms and signs involving the circulatory and respiratory systems: Secondary | ICD-10-CM

## 2013-07-11 DIAGNOSIS — Z8673 Personal history of transient ischemic attack (TIA), and cerebral infarction without residual deficits: Secondary | ICD-10-CM | POA: Diagnosis not present

## 2013-07-11 DIAGNOSIS — I129 Hypertensive chronic kidney disease with stage 1 through stage 4 chronic kidney disease, or unspecified chronic kidney disease: Secondary | ICD-10-CM | POA: Insufficient documentation

## 2013-07-11 DIAGNOSIS — I509 Heart failure, unspecified: Secondary | ICD-10-CM | POA: Insufficient documentation

## 2013-07-11 DIAGNOSIS — I279 Pulmonary heart disease, unspecified: Secondary | ICD-10-CM

## 2013-07-11 DIAGNOSIS — E662 Morbid (severe) obesity with alveolar hypoventilation: Secondary | ICD-10-CM | POA: Diagnosis not present

## 2013-07-11 DIAGNOSIS — N184 Chronic kidney disease, stage 4 (severe): Secondary | ICD-10-CM

## 2013-07-11 DIAGNOSIS — I5032 Chronic diastolic (congestive) heart failure: Secondary | ICD-10-CM | POA: Diagnosis present

## 2013-07-11 DIAGNOSIS — G4733 Obstructive sleep apnea (adult) (pediatric): Secondary | ICD-10-CM | POA: Diagnosis not present

## 2013-07-11 DIAGNOSIS — R0600 Dyspnea, unspecified: Secondary | ICD-10-CM

## 2013-07-11 DIAGNOSIS — J449 Chronic obstructive pulmonary disease, unspecified: Secondary | ICD-10-CM | POA: Diagnosis not present

## 2013-07-11 DIAGNOSIS — Z7982 Long term (current) use of aspirin: Secondary | ICD-10-CM | POA: Insufficient documentation

## 2013-07-11 DIAGNOSIS — J4489 Other specified chronic obstructive pulmonary disease: Secondary | ICD-10-CM | POA: Insufficient documentation

## 2013-07-11 DIAGNOSIS — E119 Type 2 diabetes mellitus without complications: Secondary | ICD-10-CM | POA: Diagnosis not present

## 2013-07-11 LAB — BASIC METABOLIC PANEL
BUN: 74 mg/dL — ABNORMAL HIGH (ref 6–23)
CO2: 28 mEq/L (ref 19–32)
Calcium: 9.4 mg/dL (ref 8.4–10.5)
Chloride: 100 mEq/L (ref 96–112)
Creatinine, Ser: 3.63 mg/dL — ABNORMAL HIGH (ref 0.50–1.10)
GFR, EST AFRICAN AMERICAN: 13 mL/min — AB (ref >90)
GFR, EST NON AFRICAN AMERICAN: 11 mL/min — AB (ref >90)
GLUCOSE: 104 mg/dL — AB (ref 70–99)
POTASSIUM: 3.8 meq/L (ref 3.7–5.3)
SODIUM: 141 meq/L (ref 137–147)

## 2013-07-11 LAB — PRO B NATRIURETIC PEPTIDE: PRO B NATRI PEPTIDE: 1111 pg/mL — AB (ref 0–125)

## 2013-07-11 MED ORDER — HYDRALAZINE HCL 100 MG PO TABS: 150.0000 mg | ORAL_TABLET | Freq: Three times a day (TID) | ORAL | Status: DC

## 2013-07-11 NOTE — Patient Instructions (Signed)
Increase your torsemide to 60 mg (3 tablets) in the morning and 40 mg (2 tablets) in the evening for 3 days, then go back to 40 mg (2 tablets) twice a day.   Take an extra 10 meq (1 tablet) of potassium for the next 3 days then go back to taking 10 meq (1 tablet) twice a day.  Increase your hydralazine to 150 mg (1 1/2 tablets) three times a day.  Call Dr. Teddy Spikelance's office to get a follow up appointment.   Call if any issues or weight increasing.   Follow up in 5 weeks.  Do the following things EVERYDAY: 1) Weigh yourself in the morning before breakfast. Write it down and keep it in a log. 2) Take your medicines as prescribed 3) Eat low salt foods-Limit salt (sodium) to 2000 mg per day.  4) Stay as active as you can everyday 5) Limit all fluids for the day to less than 2 liters 6)

## 2013-07-11 NOTE — Addendum Note (Signed)
Encounter addended by: Aundria RudAli B Dajuan Turnley, NP on: 07/11/2013  3:35 PM<BR>     Documentation filed: Notes Section

## 2013-08-06 ENCOUNTER — Other Ambulatory Visit (HOSPITAL_COMMUNITY): Payer: Self-pay | Admitting: Internal Medicine

## 2013-08-08 ENCOUNTER — Ambulatory Visit (INDEPENDENT_AMBULATORY_CARE_PROVIDER_SITE_OTHER): Payer: Medicare Other | Admitting: Pulmonary Disease

## 2013-08-08 ENCOUNTER — Encounter: Payer: Self-pay | Admitting: Pulmonary Disease

## 2013-08-08 ENCOUNTER — Other Ambulatory Visit (HOSPITAL_COMMUNITY): Payer: Self-pay | Admitting: *Deleted

## 2013-08-08 VITALS — BP 122/62 | HR 88 | Temp 98.0°F | Ht 64.0 in | Wt 176.2 lb

## 2013-08-08 DIAGNOSIS — J0410 Acute tracheitis without obstruction: Secondary | ICD-10-CM | POA: Diagnosis not present

## 2013-08-08 MED ORDER — AMOXICILLIN-POT CLAVULANATE 875-125 MG PO TABS: 1.0000 | ORAL_TABLET | Freq: Two times a day (BID) | ORAL | Status: DC

## 2013-08-08 NOTE — Assessment & Plan Note (Signed)
The patient has a long-standing tracheostomy, and is complaining of drainage from her trach site along with significant discomfort. She has not had any bleeding noted. I will treat her with a short course of antibiotics for possible tracheitis, but she will obviously need otolaryngology followup. Will try and get this as soon as possible.

## 2013-08-08 NOTE — Patient Instructions (Signed)
Will treat with a course of augmentin 875mg  one in am and pm on full stomach with a large glass of water for 5 days Will get an apptm for you to see ENT (Dr. Pollyann Kennedyosen) to evaluate discomfort at trach site.

## 2013-08-08 NOTE — Progress Notes (Signed)
   Subjective:    Patient ID: [name], female    DOB: 09/21/1940, 73 y.o.   MRN: [id number]  HPI Patient comes in today for an acute sick visit. I have seen her one time in the past for obstructive sleep apnea, which was not an issue given her tracheostomy status. She comes in today with the complaint of discomfort around her tracheostomy site, as well as increased drainage. She has not had any bleeding from the site according to her history, and denies any chest congestion. Her trach has been done by Dr. Pollyann Kennedyosen of otolaryngology.   Review of Systems  Constitutional: Negative for fever and unexpected weight change.  HENT: Negative for congestion, dental problem, ear pain, nosebleeds, postnasal drip, rhinorrhea, sinus pressure, sneezing, sore throat and trouble swallowing.   Eyes: Negative for redness and itching.  Respiratory: Positive for shortness of breath. Negative for cough, chest tightness and wheezing.   Cardiovascular: Negative for palpitations and leg swelling.  Gastrointestinal: Negative for nausea and vomiting.  Genitourinary: Negative for dysuria.  Musculoskeletal: Negative for joint swelling.  Skin: Negative for rash.  Neurological: Negative for headaches.  Hematological: Does not bruise/bleed easily.  Psychiatric/Behavioral: Negative for dysphoric mood. The patient is not nervous/anxious.        Objective:   Physical Exam Well-developed female in no acute distress Nose without purulence or discharge noted Neck without lymphadenopathy or thyromegaly. Tracheostomy in place with some mild discolored secretions around the trach opening. No obvious bleeding noted Chest with good breath sounds bilaterally, no rhonchi Cardiac exam with regular rate and rhythm Lower extremities with mild edema, no cyanosis Alert and oriented, moves all 4 extremities.       Assessment & Plan:

## 2013-08-09 ENCOUNTER — Encounter (HOSPITAL_COMMUNITY): Payer: Medicare Other

## 2013-08-09 ENCOUNTER — Encounter (HOSPITAL_COMMUNITY)
Admission: RE | Admit: 2013-08-09 | Discharge: 2013-08-09 | Disposition: A | Discharge status: Home / Self Care | Payer: Medicare Other | Source: Ambulatory Visit | Attending: Nephrology | Admitting: Nephrology

## 2013-08-09 DIAGNOSIS — N184 Chronic kidney disease, stage 4 (severe): Secondary | ICD-10-CM | POA: Insufficient documentation

## 2013-08-09 DIAGNOSIS — D638 Anemia in other chronic diseases classified elsewhere: Secondary | ICD-10-CM | POA: Insufficient documentation

## 2013-08-09 MED ORDER — EPOETIN ALFA 10000 UNIT/ML IJ SOLN: 5000.0000 [IU] | INTRAMUSCULAR | Status: DC

## 2013-08-09 MED ORDER — EPOETIN ALFA 10000 UNIT/ML IJ SOLN
INTRAMUSCULAR | Status: AC
  Administered 2013-08-09: 17:00:00 5000 [IU] via SUBCUTANEOUS
  Filled 2013-08-09: qty 1

## 2013-08-09 NOTE — Progress Notes (Signed)
[facility]. Ok to give Procrit as ordered. No new orders.

## 2013-08-09 NOTE — Discharge Instructions (Signed)

## 2013-08-10 LAB — POCT HEMOGLOBIN-HEMACUE: Hemoglobin: 7.7 g/dL — ABNORMAL LOW (ref 12.0–15.0)

## 2013-08-13 ENCOUNTER — Other Ambulatory Visit (HOSPITAL_COMMUNITY): Payer: Self-pay | Admitting: Cardiology

## 2013-08-13 DIAGNOSIS — I509 Heart failure, unspecified: Secondary | ICD-10-CM

## 2013-08-13 MED ORDER — POTASSIUM CHLORIDE CRYS ER 10 MEQ PO TBCR: 10.0000 meq | EXTENDED_RELEASE_TABLET | Freq: Two times a day (BID) | ORAL | Status: DC

## 2013-08-15 ENCOUNTER — Encounter (HOSPITAL_COMMUNITY): Payer: Self-pay

## 2013-08-15 ENCOUNTER — Ambulatory Visit (HOSPITAL_COMMUNITY)
Admission: RE | Admit: 2013-08-15 | Discharge: 2013-08-15 | Disposition: A | Discharge status: Home / Self Care | Payer: Medicare Other | Source: Ambulatory Visit | Attending: Adult Health | Admitting: Adult Health

## 2013-08-15 ENCOUNTER — Other Ambulatory Visit (HOSPITAL_COMMUNITY): Payer: Self-pay | Admitting: *Deleted

## 2013-08-15 VITALS — BP 174/72 | HR 83 | Wt 176.8 lb

## 2013-08-15 DIAGNOSIS — Z930 Tracheostomy status: Secondary | ICD-10-CM | POA: Diagnosis not present

## 2013-08-15 DIAGNOSIS — I279 Pulmonary heart disease, unspecified: Secondary | ICD-10-CM | POA: Insufficient documentation

## 2013-08-15 DIAGNOSIS — Z8674 Personal history of sudden cardiac arrest: Secondary | ICD-10-CM | POA: Insufficient documentation

## 2013-08-15 DIAGNOSIS — Z8673 Personal history of transient ischemic attack (TIA), and cerebral infarction without residual deficits: Secondary | ICD-10-CM | POA: Diagnosis not present

## 2013-08-15 DIAGNOSIS — Z9981 Dependence on supplemental oxygen: Secondary | ICD-10-CM | POA: Insufficient documentation

## 2013-08-15 DIAGNOSIS — N184 Chronic kidney disease, stage 4 (severe): Secondary | ICD-10-CM | POA: Diagnosis not present

## 2013-08-15 DIAGNOSIS — I129 Hypertensive chronic kidney disease with stage 1 through stage 4 chronic kidney disease, or unspecified chronic kidney disease: Secondary | ICD-10-CM | POA: Diagnosis not present

## 2013-08-15 DIAGNOSIS — I509 Heart failure, unspecified: Secondary | ICD-10-CM | POA: Diagnosis present

## 2013-08-15 DIAGNOSIS — E662 Morbid (severe) obesity with alveolar hypoventilation: Secondary | ICD-10-CM | POA: Diagnosis not present

## 2013-08-15 DIAGNOSIS — K219 Gastro-esophageal reflux disease without esophagitis: Secondary | ICD-10-CM | POA: Diagnosis not present

## 2013-08-15 DIAGNOSIS — J9610 Chronic respiratory failure, unspecified whether with hypoxia or hypercapnia: Secondary | ICD-10-CM | POA: Insufficient documentation

## 2013-08-15 DIAGNOSIS — E669 Obesity, unspecified: Secondary | ICD-10-CM | POA: Diagnosis not present

## 2013-08-15 DIAGNOSIS — I252 Old myocardial infarction: Secondary | ICD-10-CM | POA: Diagnosis not present

## 2013-08-15 DIAGNOSIS — Z7982 Long term (current) use of aspirin: Secondary | ICD-10-CM | POA: Insufficient documentation

## 2013-08-15 DIAGNOSIS — E785 Hyperlipidemia, unspecified: Secondary | ICD-10-CM | POA: Diagnosis not present

## 2013-08-15 DIAGNOSIS — Z79899 Other long term (current) drug therapy: Secondary | ICD-10-CM | POA: Insufficient documentation

## 2013-08-15 DIAGNOSIS — J449 Chronic obstructive pulmonary disease, unspecified: Secondary | ICD-10-CM | POA: Diagnosis not present

## 2013-08-15 DIAGNOSIS — I5032 Chronic diastolic (congestive) heart failure: Secondary | ICD-10-CM

## 2013-08-15 DIAGNOSIS — I110 Hypertensive heart disease with heart failure: Secondary | ICD-10-CM

## 2013-08-15 DIAGNOSIS — E119 Type 2 diabetes mellitus without complications: Secondary | ICD-10-CM | POA: Insufficient documentation

## 2013-08-15 DIAGNOSIS — J4489 Other specified chronic obstructive pulmonary disease: Secondary | ICD-10-CM | POA: Insufficient documentation

## 2013-08-15 NOTE — Progress Notes (Addendum)
Patient ID: [name], female   DOB: November 19, 1940, 73 y.o.   MRN: [id number] PCP: Dr. Allyne [facility]eeSanders [facility]) Pulmonologist: Dr. Shelle Ironlance Nephrologist: Dr. 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 More recent cardiac history includes asystolic cardiac arrest 02/2012 in the setting of a/c respiratory failure, then NSTEMI/NSVT 06/2012 (felt type II, cath deferred due to CKD, started on Plavix).  [facility] 09/13/11: mild pulmonary hypertension mean PA 28 with PVR 3, otherwise normal right heart pressures  2D echo 06/2012: EF 45%, severe concentric LVH, hypokinesis of inferolateral, inferior, and inferoseptal myocardium, grade 2 diastolic dysfunction, mild AS, moderately dilated LA/RA, mild-mod reduced RV systolic function, PA pressure 63mmHg, trivial pericardial effusion was identified posterior to the heart  Admitted to the hospital 10/06/2012 with complaints of SOB. This was her 10th admission over the last 1 year. She was diuresed with IV lasix and had a net negative of -15 liters and a discharge weight of 172 lbs. She has multiple issues with transportation to clinic appointments and has been frequently a no show.  Nephrology has seen patient; she is not a dialysis candidate. Family rejected SNF placement.   Admitted 11/14. Diuresed with IV lasix.and transitioned to 40 mg torsemide twice a day.  Discharge weight 176 pounds.   Follow up: Last visit hydralazine was increased to 150 mg tid. Doing well.  [place] SOB/OPND Orthopnea. Denies CP or LE edema.  In her wheelchair most of the day.  Wears home O2 3L. Weight at home 162-167 pounds.  Following a low salt diet and drinking less than 2L a day. Did not take medications this am . SBP at home 130.   Echo 03/21/13  EF 55% moderate to severe LVH. RV ok.    Labs (11/14): K 4.8, creatinine 3.58        (04/2013): K+ 3.8, creatinine 3.11, pro-BNP 1474   SH: Lives with daughter who is on dialysis.  No smoking.   ROS: All systems negative except as listed in HPI, PMH and Problem List.  Past Medical History  Diagnosis Date  . COPD (chronic obstructive pulmonary disease)     on home o2  . CVA (cerebral infarction)     hx  . Hypertensive heart disease with congestive heart failure   . Chronic cor pulmonale   . Chronic kidney disease (CKD), stage IV (severe)   . Hyperlipdemia   . Obesity (BMI 30-39.9)   . OSA (obstructive sleep apnea)     Does not use CPAP  . History of gout 09/07/2011  . Chronic combined systolic and diastolic CHF (congestive heart failure) 7/13    a. With cor pulmonale - EF 45-50%.  . Anemia   . Vascular disease   . Asthma   . Hypertension   . Diabetes mellitus   . GERD (gastroesophageal reflux disease)   . Arthritis   . RBBB   . Thrombocytopenia   . Acute and chronic respiratory failure     a. Trache placed 11/2011 for recurrent intubation (hypercarbic resp failure), post-intubation vocal cord edema, several issues since that time wih trache. b. On home O2 with trache.  . Obesity, unspecified 08/28/2012  . Obesity hypoventilation syndrome   . Lung collapse     a. Chronic LLL collapse.  . Cardiac arrest  a. Asystolic cardiac arrest 02/2012 in setting of a/c respiratory failure (ischemic w/u has been deferred due to worsening CKD).  . NSTEMI (non-ST elevated myocardial infarction)     a. 06/2012 felt type II - troponin peak 1.45 - ischemic eval has been deferred due to CKD. Started on Plavix.  . Pulmonary HTN     a. mild pulm HTN by [facility] 08/2011, felt due to lung disease. b. PA pressure by echo 06/2012.  Marland Kitchen NSVT (nonsustained ventricular tachycardia)     a. Noted on tele, admission in 06/2012.  Marland Kitchen Cirrhosis     a. Suspected on abd CT 12/2011.    Current Outpatient Prescriptions  Medication Sig Dispense  Refill  . albuterol (PROVENTIL) (2.5 MG/3ML) 0.083% nebulizer solution Take 2.5 mg by nebulization every 6 (six) hours as needed for wheezing.      Marland Kitchen amLODipine (NORVASC) 10 MG tablet Take 10 mg by mouth daily.      Marland Kitchen amoxicillin-clavulanate (AUGMENTIN) 875-125 MG per tablet Take 1 tablet by mouth 2 (two) times daily.  10 tablet  0  . aspirin 81 MG chewable tablet Chew 1 tablet (81 mg total) by mouth daily.      Marland Kitchen atorvastatin (LIPITOR) 40 MG tablet Take 1 tablet (40 mg total) by mouth daily.  30 tablet  1  . busPIRone (BUSPAR) 5 MG tablet Take 5 mg by mouth 2 (two) times daily.      . carvedilol (COREG) 6.25 MG tablet Take 6.25 mg by mouth 2 (two) times daily with a meal.      . cloNIDine (CATAPRES) 0.2 MG tablet Take 0.1 mg by mouth 2 (two) times daily.      . hydrALAZINE (APRESOLINE) 100 MG tablet Take 1.5 tablets (150 mg total) by mouth 3 (three) times daily.  135 tablet  3  . isosorbide mononitrate (IMDUR) 30 MG 24 hr tablet Take 1 tablet (30 mg total) by mouth daily.  30 tablet  1  . LORazepam (ATIVAN) 0.5 MG tablet Take 1 tablet (0.5 mg total) by mouth every 6 (six) hours as needed for anxiety.  30 tablet  0  . metolazone (ZAROXOLYN) 5 MG tablet TAKE 1 TABLET BY MOUTH ONCE A WEEK ON FRIDAY  5 tablet  6  . Multiple Vitamin (MULTIVITAMIN WITH MINERALS) TABS tablet Take 1 tablet by mouth daily.      . pantoprazole (PROTONIX) 20 MG tablet Take 40 mg by mouth daily.      . potassium chloride (K-DUR,KLOR-CON) 10 MEQ tablet Take 1 tablet (10 mEq total) by mouth 2 (two) times daily.  180 tablet  3  . torsemide (DEMADEX) 20 MG tablet Take 2 tablets (40 mg total) by mouth 2 (two) times daily.  30 tablet  0  . vitamin C (ASCORBIC ACID) 500 MG tablet Take 500 mg by mouth daily.       No current facility-administered medications for this encounter.   Filed Vitals:   08/15/13 1016  BP: 174/72  Pulse: 83  Weight: 176 lb 12.8 oz (80.196 kg)  SpO2: 91%   PHYSICAL EXAM: General:  Chronically ill  appearing. No resp difficulty; in wheelchair, caregiver present HEENT: normal; trach Neck: supple. JVD 5-6 Carotids 2+ bilaterally; no bruits. No lymphadenopathy or thryomegaly appreciated. Cor: PMI normal. Regular rate & rhythm. No rubs or gallops. +systolic murmur Lungs: Scattered rhonchi throughout, on 2 liters O2 Abdomen: soft, nontender, nondistended. No hepatosplenomegaly. No bruits or masses. Good bowel sounds. Extremities: no cyanosis, clubbing, rash, no  lower extremity edema Neuro: alert & orientedx3, cranial nerves grossly intact. Moves all 4 extremities w/o difficulty. Affect pleasant.  ASSESSMENT & PLAN:  1. Chronic diastolic HF:  EF 16%55% with moderate to severe LVH and normal RV (02/2013 echo).   - Stable NYHA III symptoms and volume status stable. Continue torsemide to 40  mg twice a day.  Continue metolazone 5 mg q Friday. - Continue potassium 10 meq BID  - Continue hydralazine to 150 mg TID. - Reinforced the need and importance of daily weights, a low sodium diet, and fluid restriction (less than 2 L a day). Instructed to call the HF clinic if weight increases more than 3 lbs overnight or 5 lbs in a week.   2. Chronic respiratory failure: COPD, OHS/OSA, chronic LLL collapse. Needs ongoing pulmonary follow up. She is on home oxygen via a trach. Followed by pulmonary.  3. CKD stage IV: baseline Cr 3.1-3.6. Has follow up with Dr. Hyman HopesWebb next week. Will check BMET today. 4. HTN: Elevated but she did not take her medications this am. At home SBP 130s.      CLEGG,AMY NP-C  08/15/2013  Patient with very limited functional capacity. Agree with need for wheelchair. Will order one for her.   Daniel Bensimhon,MD 3:59 PM

## 2013-08-15 NOTE — Patient Instructions (Signed)
Follow up in 3 months   Do the following things EVERYDAY: 1) Weigh yourself in the morning before breakfast. Write it down and keep it in a log. 2) Take your medicines as prescribed 3) Eat low salt foods-Limit salt (sodium) to 2000 mg per day.  4) Stay as active as you can everyday 5) Limit all fluids for the day to less than 2 liters  

## 2013-08-16 ENCOUNTER — Encounter (HOSPITAL_COMMUNITY)
Admission: RE | Admit: 2013-08-16 | Discharge: 2013-08-16 | Disposition: A | Discharge status: Home / Self Care | Payer: Medicare Other | Source: Ambulatory Visit | Attending: Nephrology | Admitting: Nephrology

## 2013-08-16 ENCOUNTER — Encounter (HOSPITAL_COMMUNITY): Payer: Self-pay

## 2013-08-16 DIAGNOSIS — D638 Anemia in other chronic diseases classified elsewhere: Secondary | ICD-10-CM | POA: Diagnosis not present

## 2013-08-16 LAB — IRON AND TIBC
Iron: 33 ug/dL — ABNORMAL LOW (ref 42–135)
Saturation Ratios: 11 % — ABNORMAL LOW (ref 20–55)
TIBC: 302 ug/dL (ref 250–470)
UIBC: 269 ug/dL (ref 125–400)

## 2013-08-16 LAB — FERRITIN: Ferritin: 15 ng/mL (ref 10–291)

## 2013-08-16 LAB — POCT HEMOGLOBIN-HEMACUE: HEMOGLOBIN: 7.5 g/dL — AB (ref 12.0–15.0)

## 2013-08-16 MED ORDER — SODIUM CHLORIDE 0.9 % IV SOLN
510.0000 mg | INTRAVENOUS | Status: DC
  Administered 2013-08-16: 14:00:00 510 mg via INTRAVENOUS
  Filled 2013-08-16: qty 17

## 2013-08-16 MED ORDER — EPOETIN ALFA 10000 UNIT/ML IJ SOLN
INTRAMUSCULAR | Status: AC
  Filled 2013-08-16: qty 1

## 2013-08-16 MED ORDER — EPOETIN ALFA 10000 UNIT/ML IJ SOLN
5000.0000 [IU] | INTRAMUSCULAR | Status: DC
  Administered 2013-08-16: 14:00:00 5000 [IU] via SUBCUTANEOUS

## 2013-08-23 ENCOUNTER — Encounter (HOSPITAL_COMMUNITY)
Admission: RE | Admit: 2013-08-23 | Discharge: 2013-08-23 | Disposition: A | Discharge status: Home / Self Care | Payer: Medicare Other | Source: Ambulatory Visit | Attending: Nephrology | Admitting: Nephrology

## 2013-08-23 DIAGNOSIS — D638 Anemia in other chronic diseases classified elsewhere: Secondary | ICD-10-CM | POA: Diagnosis not present

## 2013-08-23 DIAGNOSIS — N184 Chronic kidney disease, stage 4 (severe): Secondary | ICD-10-CM | POA: Diagnosis not present

## 2013-08-23 LAB — POCT HEMOGLOBIN-HEMACUE: Hemoglobin: 7.6 g/dL — ABNORMAL LOW (ref 12.0–15.0)

## 2013-08-23 MED ORDER — EPOETIN ALFA 10000 UNIT/ML IJ SOLN
5000.0000 [IU] | INTRAMUSCULAR | Status: DC
  Administered 2013-08-23: 5000 [IU] via SUBCUTANEOUS

## 2013-08-23 MED ORDER — SODIUM CHLORIDE 0.9 % IV SOLN
510.0000 mg | INTRAVENOUS | Status: DC
  Administered 2013-08-23: 510 mg via INTRAVENOUS
  Filled 2013-08-23: qty 17

## 2013-08-23 MED ORDER — EPOETIN ALFA 10000 UNIT/ML IJ SOLN
INTRAMUSCULAR | Status: AC
  Filled 2013-08-23: qty 1

## 2013-08-23 NOTE — Progress Notes (Signed)
Hgb today 7.6.  Called and reported hgb result today as well as last weeks result and a low grade temp today of 99.6 although the pt states she has not been sick.  Shaquina, CMA to call me back after speaking with the Dr.

## 2013-08-23 NOTE — Progress Notes (Signed)
Leonides SakeShaquina called back from Dr Marland McalpineWebb's office and no new orders were given.  Will procede with current treatment.

## 2013-08-29 ENCOUNTER — Encounter (HOSPITAL_COMMUNITY)
Admission: RE | Admit: 2013-08-29 | Discharge: 2013-08-29 | Disposition: A | Discharge status: Home / Self Care | Payer: Medicare Other | Source: Ambulatory Visit | Attending: Nephrology | Admitting: Nephrology

## 2013-08-29 DIAGNOSIS — D638 Anemia in other chronic diseases classified elsewhere: Secondary | ICD-10-CM | POA: Diagnosis not present

## 2013-08-29 MED ORDER — EPOETIN ALFA 10000 UNIT/ML IJ SOLN
5000.0000 [IU] | INTRAMUSCULAR | Status: DC
  Administered 2013-08-29: 5000 [IU] via SUBCUTANEOUS

## 2013-08-29 MED ORDER — EPOETIN ALFA 10000 UNIT/ML IJ SOLN
INTRAMUSCULAR | Status: AC
Start: 2013-08-29 — End: 2013-08-29
  Administered 2013-08-29: 5000 [IU] via SUBCUTANEOUS
  Filled 2013-08-29: qty 1

## 2013-08-29 MED ORDER — EPOETIN ALFA 20000 UNIT/ML IJ SOLN
INTRAMUSCULAR | Status: AC
  Filled 2013-08-29: qty 1

## 2013-08-30 ENCOUNTER — Encounter (HOSPITAL_COMMUNITY): Payer: Self-pay | Admitting: Emergency Medicine

## 2013-08-30 ENCOUNTER — Emergency Department (HOSPITAL_COMMUNITY): Payer: Medicare Other

## 2013-08-30 ENCOUNTER — Emergency Department (HOSPITAL_COMMUNITY)
Admission: EM | Admit: 2013-08-30 | Discharge: 2013-08-31 | Disposition: A | Discharge status: Home / Self Care | Payer: Medicare Other | Attending: Emergency Medicine | Admitting: Emergency Medicine

## 2013-08-30 DIAGNOSIS — E669 Obesity, unspecified: Secondary | ICD-10-CM | POA: Insufficient documentation

## 2013-08-30 DIAGNOSIS — E785 Hyperlipidemia, unspecified: Secondary | ICD-10-CM | POA: Insufficient documentation

## 2013-08-30 DIAGNOSIS — Z79899 Other long term (current) drug therapy: Secondary | ICD-10-CM | POA: Diagnosis not present

## 2013-08-30 DIAGNOSIS — J4489 Other specified chronic obstructive pulmonary disease: Secondary | ICD-10-CM | POA: Insufficient documentation

## 2013-08-30 DIAGNOSIS — Z8669 Personal history of other diseases of the nervous system and sense organs: Secondary | ICD-10-CM | POA: Insufficient documentation

## 2013-08-30 DIAGNOSIS — K219 Gastro-esophageal reflux disease without esophagitis: Secondary | ICD-10-CM | POA: Insufficient documentation

## 2013-08-30 DIAGNOSIS — Z87891 Personal history of nicotine dependence: Secondary | ICD-10-CM | POA: Insufficient documentation

## 2013-08-30 DIAGNOSIS — Z862 Personal history of diseases of the blood and blood-forming organs and certain disorders involving the immune mechanism: Secondary | ICD-10-CM | POA: Insufficient documentation

## 2013-08-30 DIAGNOSIS — I252 Old myocardial infarction: Secondary | ICD-10-CM | POA: Diagnosis not present

## 2013-08-30 DIAGNOSIS — J449 Chronic obstructive pulmonary disease, unspecified: Secondary | ICD-10-CM | POA: Insufficient documentation

## 2013-08-30 DIAGNOSIS — J9500 Unspecified tracheostomy complication: Secondary | ICD-10-CM

## 2013-08-30 DIAGNOSIS — Z8673 Personal history of transient ischemic attack (TIA), and cerebral infarction without residual deficits: Secondary | ICD-10-CM | POA: Diagnosis not present

## 2013-08-30 DIAGNOSIS — N184 Chronic kidney disease, stage 4 (severe): Secondary | ICD-10-CM | POA: Insufficient documentation

## 2013-08-30 DIAGNOSIS — Z9889 Other specified postprocedural states: Secondary | ICD-10-CM | POA: Diagnosis not present

## 2013-08-30 DIAGNOSIS — Z7982 Long term (current) use of aspirin: Secondary | ICD-10-CM | POA: Insufficient documentation

## 2013-08-30 DIAGNOSIS — I5042 Chronic combined systolic (congestive) and diastolic (congestive) heart failure: Secondary | ICD-10-CM | POA: Diagnosis not present

## 2013-08-30 DIAGNOSIS — I509 Heart failure, unspecified: Secondary | ICD-10-CM

## 2013-08-30 DIAGNOSIS — Z430 Encounter for attention to tracheostomy: Secondary | ICD-10-CM | POA: Insufficient documentation

## 2013-08-30 DIAGNOSIS — I110 Hypertensive heart disease with heart failure: Secondary | ICD-10-CM | POA: Insufficient documentation

## 2013-08-30 DIAGNOSIS — E119 Type 2 diabetes mellitus without complications: Secondary | ICD-10-CM | POA: Insufficient documentation

## 2013-08-30 DIAGNOSIS — M129 Arthropathy, unspecified: Secondary | ICD-10-CM | POA: Diagnosis not present

## 2013-08-30 LAB — POCT HEMOGLOBIN-HEMACUE: HEMOGLOBIN: 8.3 g/dL — AB (ref 12.0–15.0)

## 2013-08-30 NOTE — ED Provider Notes (Signed)
CSN: [id number]     Arrival date & time 08/30/13  1940 History   First MD Initiated Contact with Patient 08/30/13 2002     Chief Complaint  Patient presents with  . Tracheostomy Tube Change     HPI Patient was taking her similar off when she pulled her trachea.  Comes in for trach replacement.  Patient has long-standing medical history including COPD congestive heart failure cor pulmonale acute on chronic respiratory failure morbid obesity Past Medical History  Diagnosis Date  . COPD (chronic obstructive pulmonary disease)     on home o2  . CVA (cerebral infarction)     hx  . Hypertensive heart disease with congestive heart failure   . Chronic cor pulmonale   . Chronic kidney disease (CKD), stage IV (severe)   . Hyperlipdemia   . Obesity (BMI 30-39.9)   . OSA (obstructive sleep apnea)     Does not use CPAP  . History of gout 09/07/2011  . Chronic combined systolic and diastolic CHF (congestive heart failure) 7/13    a. With cor pulmonale - EF 45-50%.  . Anemia   . Vascular disease   . Asthma   . Hypertension   . Diabetes mellitus   . GERD (gastroesophageal reflux disease)   . Arthritis   . RBBB   . Thrombocytopenia   . Acute and chronic respiratory failure     a. Trache placed 11/2011 for recurrent intubation (hypercarbic resp failure), post-intubation vocal cord edema, several issues since that time wih trache. b. On home O2 with trache.  . Obesity, unspecified 08/28/2012  . Obesity hypoventilation syndrome   . Lung collapse     a. Chronic LLL collapse.  . Cardiac arrest     a. Asystolic cardiac arrest 02/2012 in setting of a/c respiratory failure (ischemic w/u has been deferred due to worsening CKD).  . NSTEMI (non-ST elevated myocardial infarction)     a. 06/2012 felt type II - troponin peak 1.45 - ischemic eval has been deferred due to CKD. Started on Plavix.  . Pulmonary HTN     a. mild pulm HTN by [facility] 08/2011, felt due to lung disease. b. PA pressure by echo  06/2012.  Marland Kitchen NSVT (nonsustained ventricular tachycardia)     a. Noted on tele, admission in 06/2012.  Marland Kitchen Cirrhosis     a. Suspected on abd CT 12/2011.   Past Surgical History  Procedure Laterality Date  . Bilateral oophorectomy    . Cardiac catheterization      right heart cath  . Laparoscopic gastrostomy  12/21/2011    Procedure: LAPAROSCOPIC GASTROSTOMY;  Surgeon: Axel Filler, MD;  Location: [facility] OR;  Service: General;  Laterality: N/A;  laparoscopic gastrostomy possible open feeding tube  . Abdominal hysterectomy    . Tracheostomy  11/2011  . Cataracts    . Tracheostomy tube placement  02/18/2012    Procedure: TRACHEOSTOMY;  Surgeon: Serena Colonel, MD;  Location: [facility] OR;  Service: ENT;  Laterality: N/A;  . Tracheostomy revision N/A 09/01/2012    Procedure: TRACHEOSTOMY REVISION;  Surgeon: Serena Colonel, MD;  Location: [facility] OR;  Service: ENT;  Laterality: N/A;   Family History  Problem Relation Age of Onset  . Heart attack Father   . Stroke Mother     CVA  . Coronary artery disease Daughter    History  Substance Use Topics  . Smoking status: Former Smoker -- 1.00 packs/day for 55 years    Types: Cigarettes    Quit  date: 08/28/2011  . Smokeless tobacco: Never Used  . Alcohol Use: No   OB History   Grav Para Term Preterm Abortions TAB SAB Ect Mult Living                 Review of Systems  Level V caveat  Allergies  Other and Sulfonamide derivatives  Home Medications   Prior to Admission medications   Medication Sig Start Date End Date Taking? Authorizing Provider  albuterol (PROVENTIL) (2.5 MG/3ML) 0.083% nebulizer solution Take 2.5 mg by nebulization every 6 (six) hours as needed for wheezing.   Yes Historical Provider, MD  amLODipine (NORVASC) 10 MG tablet Take 10 mg by mouth daily.   Yes Historical Provider, MD  aspirin 81 MG chewable tablet Chew 1 tablet (81 mg total) by mouth daily. 02/19/12  Yes Simonne Martinet, NP  atorvastatin (LIPITOR) 40 MG tablet Take 1 tablet  (40 mg total) by mouth daily. 07/16/12  Yes Renae Fickle, MD  busPIRone (BUSPAR) 5 MG tablet Take 5 mg by mouth 2 (two) times daily.   Yes Historical Provider, MD  carvedilol (COREG) 6.25 MG tablet Take 6.25 mg by mouth 2 (two) times daily with a meal. 12/20/12  Yes Marinda Elk, MD  cloNIDine (CATAPRES) 0.2 MG tablet Take 0.1 mg by mouth 2 (two) times daily. 01/05/13  Yes Marinda Elk, MD  hydrALAZINE (APRESOLINE) 100 MG tablet Take 1.5 tablets (150 mg total) by mouth 3 (three) times daily. 07/11/13  Yes Aundria Rud, NP  isosorbide mononitrate (IMDUR) 30 MG 24 hr tablet Take 1 tablet (30 mg total) by mouth daily. 05/19/12  Yes Estela Isaiah Blakes, MD  LORazepam (ATIVAN) 0.5 MG tablet Take 1 tablet (0.5 mg total) by mouth every 6 (six) hours as needed for anxiety. 09/14/12  Yes Christiane Ha, MD  metolazone (ZAROXOLYN) 5 MG tablet Take 5 mg by mouth once a week. Friday.   Yes Historical Provider, MD  Multiple Vitamin (MULTIVITAMIN WITH MINERALS) TABS tablet Take 1 tablet by mouth daily.   Yes Historical Provider, MD  pantoprazole (PROTONIX) 20 MG tablet Take 40 mg by mouth daily.   Yes Historical Provider, MD  potassium chloride (K-DUR,KLOR-CON) 10 MEQ tablet Take 1 tablet (10 mEq total) by mouth 2 (two) times daily. 08/13/13  Yes Dolores Patty, MD  torsemide (DEMADEX) 20 MG tablet Take 2 tablets (40 mg total) by mouth 2 (two) times daily. 01/05/13  Yes Marinda Elk, MD  vitamin C (ASCORBIC ACID) 500 MG tablet Take 500 mg by mouth daily.   Yes Historical Provider, MD   BP 161/62  Pulse 71  Temp(Src) 98.7 F (37.1 C) (Oral)  Resp 25  SpO2 99% Physical Exam Physical Exam  Nursing note and vitals reviewed. Constitutional: She is oriented to person, place, and time. She appears well-developed and well-nourished. No distress.  HENT: Tracheostomy stoma patent. Head: Normocephalic and atraumatic.  Eyes: Pupils are equal, round, and reactive to light.  Neck:  Normal range of motion.  Cardiovascular: Normal rate and intact distal pulses.   Pulmonary/Chest: No respiratory distress.  Abdominal: Normal appearance. She exhibits no distension.  Musculoskeletal: Normal range of motion.  Neurological: She is alert and oriented to person, place, and time. No cranial nerve deficit.  Skin: Skin is warm and dry. No rash noted.    ED Course  Procedures (including critical care time)  Tracheostomy tube was replaced using a bougie.  Patient observed in remained in improved condition.  Will discharge home her followup with her doctors tomorrow.  CRITICAL CARE Performed by: Nelia ShiBEATON,Militza Devery L Total critical care time: 30 minutes Critical care time was exclusive of separately billable procedures and treating other patients. Critical care was necessary to treat or prevent imminent or life-threatening deterioration. Critical care was time spent personally by me on the following activities: development of treatment plan with patient and/or surrogate as well as nursing, discussions with consultants, evaluation of patient's response to treatment, examination of patient, obtaining history from patient or surrogate, ordering and performing treatments and interventions, ordering and review of laboratory studies, ordering and review of radiographic studies, pulse oximetry and re-evaluation of patient's condition.  Labs Review Labs Reviewed - No data to display  Imaging Review Dg Chest Portable 1 View  08/30/2013   CLINICAL DATA:  Tracheostomy exchange.  EXAM: PORTABLE CHEST - 1 VIEW  COMPARISON:  01/04/2013  FINDINGS: Shallow inspiration with elevation of left hemidiaphragm. Atelectasis in the left lung base is improved since previous study. Cardiac enlargement is unchanged. No vascular congestion or edema. Tracheostomy tube is in place. Tip measures 7.2 cm above the carinal. Calcified aorta.  IMPRESSION: Shallow inspiration with elevation of left hemidiaphragm and  atelectasis in the left lung base. Cardiac enlargement. Tracheostomy appears in place.   Electronically Signed   By: Burman NievesWilliam  Stevens M.D.   On: 08/30/2013 21:15     EKG Interpretation None     Patient awake alert satting greater than 95%.  Wants to go on. MDM   Final diagnoses:  Tracheostomy complication, unspecified tracheostomy complication        Nelia Shiobert L Dwan Hemmelgarn, MD 08/30/13 2235

## 2013-08-30 NOTE — ED Notes (Signed)
Pt. Tracheostomy suctioned; red/purluent drainage.

## 2013-08-30 NOTE — Discharge Instructions (Signed)
Return as needed

## 2013-08-30 NOTE — Procedures (Signed)
Bedside Tracheostomy Insertion Procedure Note   Patient Details:   Name: [name] DOB: Jun 12, 1940 MRN: [id number]  Procedure: Tracheostomy  Pre Procedure Assessment: ET Tube Size: ET Tube secured at lip (cm): Bite block in place: No Breath Sounds: Clear and Diminished  Post Procedure Assessment: Resp 20  SpO2 97% O2 sats: stable throughout Complications: No apparent complications Patient did tolerate procedure well Tracheostomy Brand:Shiley Tracheostomy Style:Uncuffed Tracheostomy Size: 4.0 Tracheostomy Secured WUJ:[id number]:Velcro Tracheostomy Placement Confirmation:Trach cuff visualized and in place and Chest X ray ordered for placement    [name], [name] 08/30/2013, 8:10 PM

## 2013-08-30 NOTE — ED Notes (Signed)
When taking sweater the trach came out. EMS tried putting it in but unable. Pt. On nrb - 100% with NRB. VSS. Size 6 Trach.

## 2013-09-06 ENCOUNTER — Encounter (HOSPITAL_COMMUNITY)
Admission: RE | Admit: 2013-09-06 | Discharge: 2013-09-06 | Disposition: A | Discharge status: Home / Self Care | Payer: Medicare Other | Source: Ambulatory Visit | Attending: Nephrology | Admitting: Nephrology

## 2013-09-06 DIAGNOSIS — D638 Anemia in other chronic diseases classified elsewhere: Secondary | ICD-10-CM | POA: Diagnosis not present

## 2013-09-06 LAB — POCT HEMOGLOBIN-HEMACUE: Hemoglobin: 9.4 g/dL — ABNORMAL LOW (ref 12.0–15.0)

## 2013-09-06 MED ORDER — EPOETIN ALFA 10000 UNIT/ML IJ SOLN
INTRAMUSCULAR | Status: AC
  Administered 2013-09-06: 5000 [IU] via SUBCUTANEOUS
  Filled 2013-09-06: qty 1

## 2013-09-06 MED ORDER — EPOETIN ALFA 10000 UNIT/ML IJ SOLN: 5000.0000 [IU] | INTRAMUSCULAR | Status: DC

## 2013-09-13 ENCOUNTER — Encounter (HOSPITAL_COMMUNITY)
Admission: RE | Admit: 2013-09-13 | Discharge: 2013-09-13 | Disposition: A | Discharge status: Home / Self Care | Payer: Medicare Other | Source: Ambulatory Visit | Attending: Nephrology | Admitting: Nephrology

## 2013-09-13 DIAGNOSIS — D638 Anemia in other chronic diseases classified elsewhere: Secondary | ICD-10-CM | POA: Diagnosis not present

## 2013-09-13 LAB — IRON AND TIBC
IRON: 42 ug/dL (ref 42–135)
Saturation Ratios: 18 % — ABNORMAL LOW (ref 20–55)
TIBC: 229 ug/dL — AB (ref 250–470)
UIBC: 187 ug/dL (ref 125–400)

## 2013-09-13 LAB — POCT HEMOGLOBIN-HEMACUE: Hemoglobin: 9.6 g/dL — ABNORMAL LOW (ref 12.0–15.0)

## 2013-09-13 LAB — FERRITIN: Ferritin: 112 ng/mL (ref 10–291)

## 2013-09-13 MED ORDER — EPOETIN ALFA 10000 UNIT/ML IJ SOLN
5000.0000 [IU] | INTRAMUSCULAR | Status: DC
  Administered 2013-09-13: 5000 [IU] via SUBCUTANEOUS

## 2013-09-13 MED ORDER — EPOETIN ALFA 10000 UNIT/ML IJ SOLN
INTRAMUSCULAR | Status: AC
  Filled 2013-09-13: qty 1

## 2013-09-20 ENCOUNTER — Encounter (HOSPITAL_COMMUNITY)
Admission: RE | Admit: 2013-09-20 | Discharge: 2013-09-20 | Disposition: A | Discharge status: Home / Self Care | Payer: Medicare Other | Source: Ambulatory Visit | Attending: Nephrology | Admitting: Nephrology

## 2013-09-20 DIAGNOSIS — D638 Anemia in other chronic diseases classified elsewhere: Secondary | ICD-10-CM | POA: Diagnosis not present

## 2013-09-20 LAB — POCT HEMOGLOBIN-HEMACUE: Hemoglobin: 9.7 g/dL — ABNORMAL LOW (ref 12.0–15.0)

## 2013-09-20 MED ORDER — EPOETIN ALFA 10000 UNIT/ML IJ SOLN
5000.0000 [IU] | INTRAMUSCULAR | Status: DC
  Administered 2013-09-20: 5000 [IU] via SUBCUTANEOUS

## 2013-09-20 MED ORDER — EPOETIN ALFA 10000 UNIT/ML IJ SOLN
INTRAMUSCULAR | Status: AC
  Filled 2013-09-20: qty 1

## 2013-09-27 ENCOUNTER — Encounter (HOSPITAL_COMMUNITY)
Admission: RE | Admit: 2013-09-27 | Discharge: 2013-09-27 | Disposition: A | Discharge status: Home / Self Care | Payer: Medicare Other | Source: Ambulatory Visit | Attending: Nephrology | Admitting: Nephrology

## 2013-09-27 DIAGNOSIS — N184 Chronic kidney disease, stage 4 (severe): Secondary | ICD-10-CM | POA: Insufficient documentation

## 2013-09-27 DIAGNOSIS — D638 Anemia in other chronic diseases classified elsewhere: Secondary | ICD-10-CM | POA: Diagnosis present

## 2013-09-27 LAB — POCT HEMOGLOBIN-HEMACUE: Hemoglobin: 10.2 g/dL — ABNORMAL LOW (ref 12.0–15.0)

## 2013-09-27 MED ORDER — EPOETIN ALFA 10000 UNIT/ML IJ SOLN
INTRAMUSCULAR | Status: AC
  Filled 2013-09-27: qty 1

## 2013-09-27 MED ORDER — EPOETIN ALFA 10000 UNIT/ML IJ SOLN
5000.0000 [IU] | INTRAMUSCULAR | Status: DC
  Administered 2013-09-27: 14:00:00 via SUBCUTANEOUS

## 2013-10-04 ENCOUNTER — Encounter (HOSPITAL_COMMUNITY): Payer: Self-pay

## 2013-10-04 NOTE — Addendum Note (Signed)
Encounter addended by: Dolores Pattyaniel R Endrit Gittins, MD on: 10/04/2013  4:00 PM<BR>     Documentation filed: Notes Section

## 2013-10-11 ENCOUNTER — Encounter (HOSPITAL_COMMUNITY)
Admission: RE | Admit: 2013-10-11 | Discharge: 2013-10-11 | Disposition: A | Discharge status: Home / Self Care | Payer: Medicare Other | Source: Ambulatory Visit | Attending: Nephrology | Admitting: Nephrology

## 2013-10-11 DIAGNOSIS — D638 Anemia in other chronic diseases classified elsewhere: Secondary | ICD-10-CM | POA: Diagnosis not present

## 2013-10-11 LAB — POCT HEMOGLOBIN-HEMACUE: Hemoglobin: 11 g/dL — ABNORMAL LOW (ref 12.0–15.0)

## 2013-10-11 MED ORDER — EPOETIN ALFA 10000 UNIT/ML IJ SOLN: 5000.0000 [IU] | INTRAMUSCULAR | Status: DC

## 2013-10-11 MED ORDER — EPOETIN ALFA 10000 UNIT/ML IJ SOLN
INTRAMUSCULAR | Status: AC
  Administered 2013-10-11: 5000 [IU] via SUBCUTANEOUS
  Filled 2013-10-11: qty 1

## 2013-10-18 ENCOUNTER — Encounter (HOSPITAL_COMMUNITY)
Admission: RE | Admit: 2013-10-18 | Discharge: 2013-10-18 | Disposition: A | Discharge status: Home / Self Care | Payer: Medicare Other | Source: Ambulatory Visit | Attending: Nephrology | Admitting: Nephrology

## 2013-10-18 DIAGNOSIS — D638 Anemia in other chronic diseases classified elsewhere: Secondary | ICD-10-CM | POA: Diagnosis not present

## 2013-10-18 LAB — IRON AND TIBC
Iron: 63 ug/dL (ref 42–135)
Saturation Ratios: 28 % (ref 20–55)
TIBC: 228 ug/dL — ABNORMAL LOW (ref 250–470)
UIBC: 165 ug/dL (ref 125–400)

## 2013-10-18 LAB — POCT HEMOGLOBIN-HEMACUE: Hemoglobin: 11.4 g/dL — ABNORMAL LOW (ref 12.0–15.0)

## 2013-10-18 LAB — FERRITIN: Ferritin: 41 ng/mL (ref 10–291)

## 2013-10-18 MED ORDER — EPOETIN ALFA 10000 UNIT/ML IJ SOLN
5000.0000 [IU] | INTRAMUSCULAR | Status: DC
  Administered 2013-10-18: 5000 [IU] via SUBCUTANEOUS

## 2013-10-18 MED ORDER — EPOETIN ALFA 10000 UNIT/ML IJ SOLN
INTRAMUSCULAR | Status: AC
  Filled 2013-10-18: qty 1

## 2013-10-25 ENCOUNTER — Encounter (HOSPITAL_COMMUNITY): Payer: Self-pay

## 2013-10-30 ENCOUNTER — Other Ambulatory Visit: Payer: Self-pay | Admitting: Internal Medicine

## 2013-11-01 ENCOUNTER — Encounter (HOSPITAL_COMMUNITY)
Admission: RE | Admit: 2013-11-01 | Discharge: 2013-11-01 | Disposition: A | Discharge status: Home / Self Care | Payer: Medicare Other | Source: Ambulatory Visit | Attending: Nephrology | Admitting: Nephrology

## 2013-11-01 DIAGNOSIS — N184 Chronic kidney disease, stage 4 (severe): Secondary | ICD-10-CM | POA: Insufficient documentation

## 2013-11-01 DIAGNOSIS — D638 Anemia in other chronic diseases classified elsewhere: Secondary | ICD-10-CM | POA: Diagnosis present

## 2013-11-01 LAB — POCT HEMOGLOBIN-HEMACUE: HEMOGLOBIN: 10.4 g/dL — AB (ref 12.0–15.0)

## 2013-11-01 MED ORDER — EPOETIN ALFA 10000 UNIT/ML IJ SOLN: 5000.0000 [IU] | INTRAMUSCULAR | Status: DC

## 2013-11-01 MED ORDER — EPOETIN ALFA 10000 UNIT/ML IJ SOLN
INTRAMUSCULAR | Status: AC
  Administered 2013-11-01: 5000 [IU] via SUBCUTANEOUS
  Filled 2013-11-01: qty 1

## 2013-11-08 ENCOUNTER — Encounter (HOSPITAL_COMMUNITY)
Admission: RE | Admit: 2013-11-08 | Discharge: 2013-11-08 | Disposition: A | Discharge status: Home / Self Care | Payer: Medicare Other | Source: Ambulatory Visit | Attending: Nephrology | Admitting: Nephrology

## 2013-11-08 DIAGNOSIS — D638 Anemia in other chronic diseases classified elsewhere: Secondary | ICD-10-CM | POA: Diagnosis not present

## 2013-11-08 LAB — POCT HEMOGLOBIN-HEMACUE: Hemoglobin: 10.9 g/dL — ABNORMAL LOW (ref 12.0–15.0)

## 2013-11-08 MED ORDER — EPOETIN ALFA 10000 UNIT/ML IJ SOLN
5000.0000 [IU] | INTRAMUSCULAR | Status: DC
  Administered 2013-11-08: 5000 [IU] via SUBCUTANEOUS

## 2013-11-08 MED ORDER — EPOETIN ALFA 10000 UNIT/ML IJ SOLN
INTRAMUSCULAR | Status: AC
  Filled 2013-11-08: qty 1

## 2013-11-12 ENCOUNTER — Other Ambulatory Visit (HOSPITAL_COMMUNITY): Payer: Self-pay | Admitting: Anesthesiology

## 2013-11-15 ENCOUNTER — Inpatient Hospital Stay (HOSPITAL_COMMUNITY): Admission: RE | Admit: 2013-11-15 | Payer: Self-pay | Source: Ambulatory Visit

## 2013-11-22 ENCOUNTER — Encounter (HOSPITAL_COMMUNITY)
Admission: RE | Admit: 2013-11-22 | Discharge: 2013-11-22 | Disposition: A | Discharge status: Home / Self Care | Payer: Medicare Other | Source: Ambulatory Visit | Attending: Nephrology | Admitting: Nephrology

## 2013-11-22 DIAGNOSIS — D631 Anemia in chronic kidney disease: Secondary | ICD-10-CM | POA: Diagnosis not present

## 2013-11-22 DIAGNOSIS — N184 Chronic kidney disease, stage 4 (severe): Secondary | ICD-10-CM | POA: Insufficient documentation

## 2013-11-22 LAB — IRON AND TIBC
Iron: 54 ug/dL (ref 42–135)
Saturation Ratios: 22 % (ref 20–55)
TIBC: 246 ug/dL — AB (ref 250–470)
UIBC: 192 ug/dL (ref 125–400)

## 2013-11-22 LAB — POCT HEMOGLOBIN-HEMACUE: Hemoglobin: 11.1 g/dL — ABNORMAL LOW (ref 12.0–15.0)

## 2013-11-22 LAB — FERRITIN: FERRITIN: 68 ng/mL (ref 10–291)

## 2013-11-22 MED ORDER — EPOETIN ALFA 10000 UNIT/ML IJ SOLN
5000.0000 [IU] | INTRAMUSCULAR | Status: DC
  Administered 2013-11-22: 5000 [IU] via SUBCUTANEOUS

## 2013-11-22 MED ORDER — EPOETIN ALFA 10000 UNIT/ML IJ SOLN
INTRAMUSCULAR | Status: AC
  Administered 2013-11-22: 5000 [IU] via SUBCUTANEOUS
  Filled 2013-11-22: qty 1

## 2013-11-26 ENCOUNTER — Telehealth (HOSPITAL_COMMUNITY): Payer: Self-pay | Admitting: Vascular Surgery

## 2013-11-26 ENCOUNTER — Telehealth: Payer: Self-pay | Admitting: *Deleted

## 2013-11-26 ENCOUNTER — Other Ambulatory Visit: Payer: Self-pay | Admitting: Internal Medicine

## 2013-11-26 NOTE — Telephone Encounter (Signed)
Pt daughter called about to see if this office will refill prescriptions.. Please advise

## 2013-11-26 NOTE — Telephone Encounter (Signed)
Patient called and stated that she needed refills on all of her medications. But patient has not established with our practice yet. Informed her that she would have to go through her previous Primary Dr. Or go to Urgent care until she establishes with our practice. She agreed.

## 2013-11-27 MED ORDER — ATORVASTATIN CALCIUM 40 MG PO TABS: 40.0000 mg | ORAL_TABLET | Freq: Every day | ORAL | Status: DC

## 2013-11-27 MED ORDER — BUSPIRONE HCL 5 MG PO TABS: 5.0000 mg | ORAL_TABLET | Freq: Two times a day (BID) | ORAL | Status: DC

## 2013-11-27 MED ORDER — ISOSORBIDE MONONITRATE ER 30 MG PO TB24: 30.0000 mg | ORAL_TABLET | Freq: Every day | ORAL | Status: DC

## 2013-11-27 MED ORDER — AMLODIPINE BESYLATE 10 MG PO TABS: 10.0000 mg | ORAL_TABLET | Freq: Every day | ORAL | Status: DC

## 2013-11-27 MED ORDER — TORSEMIDE 20 MG PO TABS: 40.0000 mg | ORAL_TABLET | Freq: Two times a day (BID) | ORAL | Status: DC

## 2013-11-27 MED ORDER — CARVEDILOL 6.25 MG PO TABS: 6.2500 mg | ORAL_TABLET | Freq: Two times a day (BID) | ORAL | Status: DC

## 2013-11-27 NOTE — Telephone Encounter (Signed)
Pts daughter called to request refills Willing to refill meds until pt is seen for follow up

## 2013-11-29 ENCOUNTER — Encounter (HOSPITAL_COMMUNITY)
Admission: RE | Admit: 2013-11-29 | Discharge: 2013-11-29 | Disposition: A | Discharge status: Home / Self Care | Payer: Medicare Other | Source: Ambulatory Visit | Attending: Nephrology | Admitting: Nephrology

## 2013-11-29 DIAGNOSIS — D631 Anemia in chronic kidney disease: Secondary | ICD-10-CM | POA: Diagnosis not present

## 2013-11-29 LAB — POCT HEMOGLOBIN-HEMACUE: HEMOGLOBIN: 10.5 g/dL — AB (ref 12.0–15.0)

## 2013-11-29 MED ORDER — EPOETIN ALFA 10000 UNIT/ML IJ SOLN
INTRAMUSCULAR | Status: AC
  Administered 2013-11-29: 5000 [IU] via SUBCUTANEOUS
  Filled 2013-11-29: qty 1

## 2013-11-29 MED ORDER — EPOETIN ALFA 10000 UNIT/ML IJ SOLN
5000.0000 [IU] | INTRAMUSCULAR | Status: DC
  Administered 2013-11-29: 5000 [IU] via SUBCUTANEOUS

## 2013-12-04 ENCOUNTER — Encounter: Payer: Self-pay | Admitting: Internal Medicine

## 2013-12-04 ENCOUNTER — Ambulatory Visit (INDEPENDENT_AMBULATORY_CARE_PROVIDER_SITE_OTHER): Payer: Medicare Other | Admitting: Internal Medicine

## 2013-12-04 VITALS — BP 136/78 | HR 73 | Temp 98.7°F | Resp 10 | Ht 64.0 in | Wt 164.0 lb

## 2013-12-04 DIAGNOSIS — E119 Type 2 diabetes mellitus without complications: Secondary | ICD-10-CM | POA: Insufficient documentation

## 2013-12-04 DIAGNOSIS — I5042 Chronic combined systolic (congestive) and diastolic (congestive) heart failure: Secondary | ICD-10-CM

## 2013-12-04 DIAGNOSIS — K219 Gastro-esophageal reflux disease without esophagitis: Secondary | ICD-10-CM

## 2013-12-04 DIAGNOSIS — F329 Major depressive disorder, single episode, unspecified: Secondary | ICD-10-CM

## 2013-12-04 DIAGNOSIS — J9610 Chronic respiratory failure, unspecified whether with hypoxia or hypercapnia: Secondary | ICD-10-CM

## 2013-12-04 DIAGNOSIS — F411 Generalized anxiety disorder: Secondary | ICD-10-CM | POA: Insufficient documentation

## 2013-12-04 DIAGNOSIS — F32A Depression, unspecified: Secondary | ICD-10-CM | POA: Insufficient documentation

## 2013-12-04 DIAGNOSIS — N189 Chronic kidney disease, unspecified: Secondary | ICD-10-CM | POA: Insufficient documentation

## 2013-12-04 DIAGNOSIS — I10 Essential (primary) hypertension: Secondary | ICD-10-CM | POA: Insufficient documentation

## 2013-12-04 DIAGNOSIS — Z23 Encounter for immunization: Secondary | ICD-10-CM

## 2013-12-04 DIAGNOSIS — N184 Chronic kidney disease, stage 4 (severe): Secondary | ICD-10-CM

## 2013-12-04 DIAGNOSIS — E876 Hypokalemia: Secondary | ICD-10-CM

## 2013-12-04 NOTE — Progress Notes (Signed)
Patient ID: [name], female   DOB: 20-Feb-1941, 73 y.o.   MRN: [id number]      Allergies  Allergen Reactions  . Other Itching    "Wool"  . Sulfonamide Derivatives Hives and Rash    Chief Complaint:  Chief Complaint  Patient presents with  . Establish Care    New patient establish care     HPI:  73 y/o female patient is here to establish care. She has hx of chronic respiratory failure s/p tracheostomy and on oxygen 2l/min, OSA, CHF, CKD stage IV, HTN, DM, CVA, anemia. She follows with cardiology Dr Arvilla Meresaniel Bensimhon and Dr Shelle Ironlance from pulmonary She also sees Dr Hyman HopesWebb from [facility] She has home health aid coming in 7 days a week 3 hours on weekdays and 2 and a half hour on weekends with [facility] Magda [facility]aganiniudrey from [facility] brings her to appointments and is here with patient today Was seeing Dr Allyne GeeSanders for primary care before this, last seen 6 months back and was discharged from the practice She denies any concerns this visit.  Review of Systems:  Constitutional: Negative for fever, chills, diaphoresis.  HENT: Negative for congestion, sore throat. Has a trach. Has noticed hearing loss. Eyes: Negative for eye pain, blurred vision, double vision and discharge. has not seen eye doctor in several years Respiratory: Negative for cough, wheezing.  has to suction her trach site twice a day. On continuous oxygen. Gets SOB with mild exertion.  Cardiovascular: Negative for chest pain, palpitations, leg swelling.  Gastrointestinal: Negative for heartburn, nausea, vomiting, abdominal pain, diarrhea, blood in stool and constipation. has black colored stool but takes iron supplement Genitourinary: Negative for dysuria, urgency, frequency, hematuria, vaginal discharge and flank pain.  Musculoskeletal: Negative for back pain, falls, myalgias. uses a cane at home for shot distance. Has a walker at home. Here on wheelchair Skin: Negative for itching and rash.    Neurological: Negative for dizziness, tingling, focal weakness and headaches.  Psychiatric/Behavioral: Negative for depression and memory loss. The patient is not nervous/anxious.     Past Medical History  Diagnosis Date  . COPD (chronic obstructive pulmonary disease)     on home o2  . CVA (cerebral infarction)     hx  . Hypertensive heart disease with congestive heart failure   . Chronic cor pulmonale   . Chronic kidney disease (CKD), stage IV (severe)   . Hyperlipdemia   . Obesity (BMI 30-39.9)   . OSA (obstructive sleep apnea)     Does not use CPAP  . History of gout 09/07/2011  . Chronic combined systolic and diastolic CHF (congestive heart failure) 7/13    a. With cor pulmonale - EF 45-50%.  . Anemia   . Vascular disease   . Asthma   . Hypertension   . Diabetes mellitus   . GERD (gastroesophageal reflux disease)   . Arthritis   . RBBB   . Thrombocytopenia   . Acute and chronic respiratory failure     a. Trache placed 11/2011 for recurrent intubation (hypercarbic resp failure), post-intubation vocal cord edema, several issues since that time wih trache. b. On home O2 with trache.  . Obesity, unspecified 08/28/2012  . Obesity hypoventilation syndrome   . Lung collapse     a. Chronic LLL collapse.  . Cardiac arrest     a. Asystolic cardiac arrest 02/2012 in setting of a/c respiratory failure (ischemic w/u has been deferred due to worsening CKD).  . NSTEMI (non-ST elevated  myocardial infarction)     a. 06/2012 felt type II - troponin peak 1.45 - ischemic eval has been deferred due to CKD. Started on Plavix.  . Pulmonary HTN     a. mild pulm HTN by [facility] 08/2011, felt due to lung disease. b. PA pressure 63mmHg by echo 06/2012.  Marland Kitchen. NSVT (nonsustained ventricular tachycardia)     a. Noted on tele, admission in 06/2012.  Marland Kitchen. Cirrhosis     a. Suspected on abd CT 12/2011.   Past Surgical History  Procedure Laterality Date  . Bilateral oophorectomy    . Cardiac catheterization       right heart cath  . Laparoscopic gastrostomy  12/21/2011    Procedure: LAPAROSCOPIC GASTROSTOMY;  Surgeon: Axel FillerArmando Ramirez, MD;  [facility];  Service: General;  Laterality: N/A;  laparoscopic gastrostomy possible open feeding tube  . Abdominal hysterectomy    . Tracheostomy  11/2011  . Cataracts    . Tracheostomy tube placement  02/18/2012    Procedure: TRACHEOSTOMY;  Surgeon: Serena ColonelJefry Rosen, MD;  [facility];  Service: ENT;  Laterality: N/A;  . Tracheostomy revision N/A 09/01/2012    Procedure: TRACHEOSTOMY REVISION;  Surgeon: Serena ColonelJefry Rosen, MD;  [facility];  Service: ENT;  Laterality: N/A;   Social History:   reports that she quit smoking about 2 years ago. Her smoking use included Cigarettes. She has a 55 pack-year smoking history. She has never used smokeless tobacco. She reports that she does not drink alcohol or use illicit drugs.  Family History  Problem Relation Age of Onset  . Heart attack Father   . Stroke Mother     CVA  . Coronary artery disease Daughter   . Kidney failure Daughter   . Heart murmur Daughter   . Breast cancer Cousin   . Diabetes Cousin     Mother's side of family     Medications: Patient's Medications  New Prescriptions   No medications on file  Previous Medications   ACETAMINOPHEN (TYLENOL) 500 MG TABLET    Take 500 mg by mouth as needed.   ALBUTEROL (PROVENTIL) (2.5 MG/3ML) 0.083% NEBULIZER SOLUTION    Take 2.5 mg by nebulization every 6 (six) hours as needed for wheezing.   AMLODIPINE (NORVASC) 10 MG TABLET    Take 1 tablet (10 mg total) by mouth daily.   ASPIRIN 81 MG CHEWABLE TABLET    Chew 1 tablet (81 mg total) by mouth daily.   ATORVASTATIN (LIPITOR) 40 MG TABLET    Take 1 tablet (40 mg total) by mouth daily.   BUSPIRONE (BUSPAR) 5 MG TABLET    Take 1 tablet (5 mg total) by mouth 2 (two) times daily.   CARVEDILOL (COREG) 6.25 MG TABLET    Take 1 tablet (6.25 mg total) by mouth 2 (two) times daily with a meal.   CLONIDINE (CATAPRES)  0.2 MG TABLET    TAKE 1/2 TABLET BY MOUTH TWICE DAILY   HYDRALAZINE (APRESOLINE) 100 MG TABLET    TAKE 11/2 TABLETS (150MG  TOTAL) BY MOUTH THREE TIMES A DAY   ISOSORBIDE MONONITRATE (IMDUR) 30 MG 24 HR TABLET    Take 1 tablet (30 mg total) by mouth daily.   LORAZEPAM (ATIVAN) 0.5 MG TABLET    Take 1 tablet (0.5 mg total) by mouth every 6 (six) hours as needed for anxiety.   METOLAZONE (ZAROXOLYN) 5 MG TABLET    Take 5 mg by mouth once a week. Friday.   MULTIPLE VITAMIN (MULTIVITAMIN WITH MINERALS)  TABS TABLET    Take 1 tablet by mouth daily.   PANTOPRAZOLE (PROTONIX) 20 MG TABLET    TAKE 2 TABLETS BY MOUTH ONCE DAILY   POTASSIUM CHLORIDE (K-DUR,KLOR-CON) 10 MEQ TABLET    Take 1 tablet (10 mEq total) by mouth 2 (two) times daily.   TORSEMIDE (DEMADEX) 20 MG TABLET    Take 2 tablets (40 mg total) by mouth 2 (two) times daily.   VITAMIN C (ASCORBIC ACID) 500 MG TABLET    Take 500 mg by mouth daily.  Modified Medications   No medications on file  Discontinued Medications   No medications on file     Physical Exam: Filed Vitals:   12/04/13 1035  BP: 136/78  Pulse: 73  Temp: 98.7 F (37.1 C)  TempSrc: Oral  Resp: 10  Height: 5\' 4"  (1.626 m)  Weight: 164 lb (74.39 kg)  SpO2: 98%    General- elderly female in no acute distress, obese Head- atraumatic, normocephalic Eyes- PERRLA, EOMI, no pallor, no icterus, no discharge Neck- no lymphadenopathy Throat- moist mucus membrane, normal oropharynx Nose- normal nasal mucosa, no maxillary or frontal sinus tenderness Cardiovascular- normal s1,s2, no murmurs Respiratory- bilateral clear to auscultation, no wheeze, no rhonchi, no crackles, no use of accessory muscles, trach site clean, on o2 Abdomen- bowel sounds present, soft, non tender, no CVA tenderness Musculoskeletal- able to move all 4 extremities, no spinal and paraspinal tenderness, 1+  leg edema Neurological- no focal deficit Skin- warm and dry Psychiatry- alert and oriented to  person, place and time, normal mood and affect   Labs reviewed: Basic Metabolic Panel:  Recent Labs  16/10/96 0900 05/02/13 1219 07/11/13 1208  NA 137 144 141  K 4.8 3.8 3.8  CL 100 103 100  CO2 26 26 28   GLUCOSE 175* 69* 104*  BUN 88* 66* 74*  CREATININE 3.58* 3.11* 3.63*  CALCIUM 8.9 9.2 9.4   Liver Function Tests:  Recent Labs  12/17/12 1403  AST 12  ALT 6  ALKPHOS 74  BILITOT 0.3  PROT 6.3  ALBUMIN 3.2*   No results found for this basename: LIPASE, AMYLASE,  in the last 8760 hours No results found for this basename: AMMONIA,  in the last 8760 hours CBC:  Recent Labs  12/31/12 1255 01/01/13 0420 01/02/13 0529  11/08/13 1531 11/22/13 1529 11/29/13 1507  WBC 11.4* 9.5 11.5*  --   --   --   --   NEUTROABS 8.6*  --   --   --   --   --   --   HGB 8.8* 8.3* 8.4*  < > 10.9* 11.1* 10.5*  HCT 27.6* 26.5* 26.3*  --   --   --   --   MCV 83.4 83.1 81.9  --   --   --   --   PLT 187 166 222  --   --   --   --   < > = values in this interval not displayed. Cardiac Enzymes:  Recent Labs  12/18/12 0659 12/18/12 1355 12/31/12 1255  TROPONINI <0.30 <0.30 <0.30   BNP: No components found with this basename: POCBNP,  CBG:  Recent Labs  01/05/13 2123 01/06/13 0628 01/06/13 1112  GLUCAP 156* 107* 110*    Lab Results  Component Value Date   HGBA1C 4.2 12/18/2012   Radiological Exams: Echocardiogram 02/2013- EF 55%, severe  LVH and normal RV  Assessment/Plan  1. Diabetes type 2, controlled Off all medication at present. Recheck a1c  today. Get old records for review. Continue aspirin, lipitor, not on ACEI with her CKD. Get urine microalbumin and do detailed foot exam on next visit if not done this year after review of records - Hemoglobin A1c  2. Chronic kidney disease (CKD), stage 4 (severe) Follows with dr Hyman Hopes, review records, avoid NSAIDs. Patient does not want to proceed with dialysis - CMP  3. Encounter for immunization Influenza vaccine  given  4. CHF Euvolemic, continue coreg, imdur, hydralazine and torsemide with metolazone for now. Monitor weight.   5. Hypokalemia Continue kcl supplement, monitor bmp  6. gerd Continue protonix for now  7. Anxiety Controlled with current regimen of ativan, monitor  8. HTN Stable this visit. On multiple drug regimen, currently controlled, monitor clinically  9. Depression Continue buspar for now, stable  10. Chronic respiratory failure With chf, copd, osa, continue o2 for now and her albuterol. Monitor clinically and has f/u with pulmonary  Labs/tests ordered: cmp, a1c    Oneal Grout, MD  [facility] [phone] (Monday-Friday 8 am - 5 pm) [phone] (afterhours)

## 2013-12-05 LAB — HEMOGLOBIN A1C
Est. average glucose Bld gHb Est-mCnc: 85 mg/dL
HEMOGLOBIN A1C: 4.6 % — AB (ref 4.8–5.6)

## 2013-12-05 LAB — COMPREHENSIVE METABOLIC PANEL
ALBUMIN: 4 g/dL (ref 3.5–4.8)
ALK PHOS: 108 IU/L (ref 39–117)
ALT: 7 IU/L (ref 0–32)
AST: 13 IU/L (ref 0–40)
Albumin/Globulin Ratio: 1.4 (ref 1.1–2.5)
BUN / CREAT RATIO: 14 (ref 11–26)
BUN: 53 mg/dL — AB (ref 8–27)
CHLORIDE: 97 mmol/L (ref 97–108)
CO2: 24 mmol/L (ref 18–29)
CREATININE: 3.8 mg/dL — AB (ref 0.57–1.00)
Calcium: 9.9 mg/dL (ref 8.7–10.3)
GFR calc Af Amer: 13 mL/min/{1.73_m2} — ABNORMAL LOW (ref >59)
GFR calc non Af Amer: 11 mL/min/{1.73_m2} — ABNORMAL LOW (ref >59)
GLOBULIN, TOTAL: 2.8 g/dL (ref 1.5–4.5)
Glucose: 78 mg/dL (ref 65–99)
Potassium: 3.9 mmol/L (ref 3.5–5.2)
SODIUM: 140 mmol/L (ref 134–144)
Total Bilirubin: 0.4 mg/dL (ref 0.0–1.2)
Total Protein: 6.8 g/dL (ref 6.0–8.5)

## 2013-12-06 ENCOUNTER — Encounter (HOSPITAL_COMMUNITY)
Admission: RE | Admit: 2013-12-06 | Discharge: 2013-12-06 | Disposition: A | Discharge status: Home / Self Care | Payer: Medicare Other | Source: Ambulatory Visit | Attending: Nephrology | Admitting: Nephrology

## 2013-12-06 DIAGNOSIS — D631 Anemia in chronic kidney disease: Secondary | ICD-10-CM | POA: Diagnosis not present

## 2013-12-06 LAB — POCT HEMOGLOBIN-HEMACUE: HEMOGLOBIN: 10.4 g/dL — AB (ref 12.0–15.0)

## 2013-12-06 MED ORDER — EPOETIN ALFA 10000 UNIT/ML IJ SOLN
5000.0000 [IU] | INTRAMUSCULAR | Status: DC
  Administered 2013-12-06: 5000 [IU] via SUBCUTANEOUS

## 2013-12-06 MED ORDER — EPOETIN ALFA 10000 UNIT/ML IJ SOLN
INTRAMUSCULAR | Status: AC
  Filled 2013-12-06: qty 1

## 2013-12-10 ENCOUNTER — Ambulatory Visit (HOSPITAL_COMMUNITY)
Admission: RE | Admit: 2013-12-10 | Discharge: 2013-12-10 | Disposition: A | Discharge status: Home / Self Care | Payer: Medicare Other | Source: Ambulatory Visit | Attending: Cardiology | Admitting: Cardiology

## 2013-12-10 VITALS — BP 124/62 | HR 74 | Wt 183.5 lb

## 2013-12-10 DIAGNOSIS — Z8673 Personal history of transient ischemic attack (TIA), and cerebral infarction without residual deficits: Secondary | ICD-10-CM | POA: Insufficient documentation

## 2013-12-10 DIAGNOSIS — I5030 Unspecified diastolic (congestive) heart failure: Secondary | ICD-10-CM | POA: Insufficient documentation

## 2013-12-10 DIAGNOSIS — I2781 Cor pulmonale (chronic): Secondary | ICD-10-CM | POA: Diagnosis not present

## 2013-12-10 DIAGNOSIS — Z8674 Personal history of sudden cardiac arrest: Secondary | ICD-10-CM | POA: Insufficient documentation

## 2013-12-10 DIAGNOSIS — Z9981 Dependence on supplemental oxygen: Secondary | ICD-10-CM | POA: Diagnosis not present

## 2013-12-10 DIAGNOSIS — E119 Type 2 diabetes mellitus without complications: Secondary | ICD-10-CM | POA: Insufficient documentation

## 2013-12-10 DIAGNOSIS — J9611 Chronic respiratory failure with hypoxia: Secondary | ICD-10-CM

## 2013-12-10 DIAGNOSIS — I252 Old myocardial infarction: Secondary | ICD-10-CM | POA: Diagnosis not present

## 2013-12-10 DIAGNOSIS — I5032 Chronic diastolic (congestive) heart failure: Secondary | ICD-10-CM

## 2013-12-10 DIAGNOSIS — N184 Chronic kidney disease, stage 4 (severe): Secondary | ICD-10-CM | POA: Diagnosis not present

## 2013-12-10 DIAGNOSIS — J9610 Chronic respiratory failure, unspecified whether with hypoxia or hypercapnia: Secondary | ICD-10-CM | POA: Insufficient documentation

## 2013-12-10 DIAGNOSIS — G4733 Obstructive sleep apnea (adult) (pediatric): Secondary | ICD-10-CM | POA: Diagnosis present

## 2013-12-10 DIAGNOSIS — I129 Hypertensive chronic kidney disease with stage 1 through stage 4 chronic kidney disease, or unspecified chronic kidney disease: Secondary | ICD-10-CM | POA: Insufficient documentation

## 2013-12-10 DIAGNOSIS — Z79899 Other long term (current) drug therapy: Secondary | ICD-10-CM | POA: Diagnosis not present

## 2013-12-10 DIAGNOSIS — I5042 Chronic combined systolic (congestive) and diastolic (congestive) heart failure: Secondary | ICD-10-CM | POA: Diagnosis present

## 2013-12-10 NOTE — Progress Notes (Signed)
Patient ID: [name], female   DOB: 1940-11-20, 73 y.o.   MRN: [id number] [facility] Pulmonologist: Dr. Shelle Ironlance Nephrologist: Dr. Hyman HopesWebb  HPI: 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 More recent cardiac history includes asystolic cardiac arrest 02/2012 in the setting of a/c respiratory failure, then NSTEMI/NSVT 06/2012 (felt type II, cath deferred due to CKD, started on Plavix).  RHC 09/13/11: mild pulmonary hypertension mean PA 28 with PVR 3, otherwise normal right heart pressures  2D echo 06/2012: EF 45%, severe concentric LVH, hypokinesis of inferolateral, inferior, and inferoseptal myocardium, grade 2 diastolic dysfunction, mild AS, moderately dilated LA/RA, mild-mod reduced RV systolic function, PA pressure 63mmHg, trivial pericardial effusion was identified posterior to the heart  Admitted to the hospital 10/06/2012 with complaints of SOB. This was her 10th admission over the last 1 year. She was diuresed with IV lasix and had a net negative of -15 liters and a discharge weight of 172 lbs. She has multiple issues with transportation to clinic appointments and has been frequently a no show.  Nephrology has seen patient; she is not a dialysis candidate. Family rejected SNF placement.   Admitted 11/14. Diuresed with IV lasix.and transitioned to 40 mg torsemide twice a day.  Discharge weight 176 pounds.   Follow up: Overall she feels ok. Does admit to dyspnea ambulating in the house. Orthopnea --> sleeps on 2 pillows. Remains essentially wheelchair bound. On 3 liters . Weight at ome 171-176 pounds.  She is able to fill her own pill box. Has an Aide twice a day. Taking Iron every Thursday. She is followed by [facility].  .    Echo 03/21/13  EF 55% moderate to severe LVH. RV ok.    Labs (11/14): K 4.8, creatinine 3.58        (04/2013): K+ 3.8, creatinine 3.11, pro-BNP 1474   (12/04/13) Creatinine 3.8 BUN 53 K 3.9           SH: Lives with daughter who is on dialysis.  No smoking.   ROS: All systems negative except as listed in HPI, PMH and Problem List.  Past Medical History  Diagnosis Date  . COPD (chronic obstructive pulmonary disease)     on home o2  . CVA (cerebral infarction)     hx  . Hypertensive heart disease with congestive heart failure   . Chronic cor pulmonale   . Chronic kidney disease (CKD), stage IV (severe)   . Hyperlipdemia   . Obesity (BMI 30-39.9)   . OSA (obstructive sleep apnea)     Does not use CPAP  . History of gout 09/07/2011  . Chronic combined systolic and diastolic CHF (congestive heart failure) 7/13    a. With cor pulmonale - EF 45-50%.  . Anemia   . Vascular disease   . Asthma   . Hypertension   . Diabetes mellitus   . GERD (gastroesophageal reflux disease)   . Arthritis   . RBBB   . Thrombocytopenia   . Acute and chronic respiratory failure     a. Trache placed 11/2011 for recurrent intubation (hypercarbic resp failure), post-intubation vocal cord edema, several issues since that time wih trache. b. On home O2 with trache.  . Obesity, unspecified 08/28/2012  . Obesity hypoventilation syndrome   . Lung collapse  a. Chronic LLL collapse.  . Cardiac arrest     a. Asystolic cardiac arrest 02/2012 in setting of a/c respiratory failure (ischemic w/u has been deferred due to worsening CKD).  . NSTEMI (non-ST elevated myocardial infarction)     a. 06/2012 felt type II - troponin peak 1.45 - ischemic eval has been deferred due to CKD. Started on Plavix.  . Pulmonary HTN     a. mild pulm HTN by RHC 08/2011, felt due to lung disease. b. PA pressure 63mmHg by echo 06/2012.  Marland Kitchen. NSVT (nonsustained ventricular tachycardia)     a. Noted on tele, admission in 06/2012.  Marland Kitchen. Cirrhosis     a. Suspected on abd CT 12/2011.    Current  Outpatient Prescriptions  Medication Sig Dispense Refill  . acetaminophen (TYLENOL) 500 MG tablet Take 500 mg by mouth as needed.      Marland Kitchen. albuterol (PROVENTIL) (2.5 MG/3ML) 0.083% nebulizer solution Take 2.5 mg by nebulization every 6 (six) hours as needed for wheezing.      Marland Kitchen. amLODipine (NORVASC) 10 MG tablet Take 1 tablet (10 mg total) by mouth daily.  90 tablet  0  . aspirin 81 MG chewable tablet Chew 1 tablet (81 mg total) by mouth daily.      Marland Kitchen. atorvastatin (LIPITOR) 40 MG tablet Take 1 tablet (40 mg total) by mouth daily.  90 tablet  0  . busPIRone (BUSPAR) 5 MG tablet Take 1 tablet (5 mg total) by mouth 2 (two) times daily.  180 tablet  0  . carvedilol (COREG) 6.25 MG tablet Take 1 tablet (6.25 mg total) by mouth 2 (two) times daily with a meal.  180 tablet  0  . cloNIDine (CATAPRES) 0.2 MG tablet TAKE 1/2 TABLET BY MOUTH TWICE DAILY  60 tablet  3  . hydrALAZINE (APRESOLINE) 100 MG tablet TAKE 11/2 TABLETS (150MG  TOTAL) BY MOUTH THREE TIMES A DAY  90 tablet  2  . isosorbide mononitrate (IMDUR) 30 MG 24 hr tablet Take 1 tablet (30 mg total) by mouth daily.  90 tablet  0  . LORazepam (ATIVAN) 0.5 MG tablet Take 1 tablet (0.5 mg total) by mouth every 6 (six) hours as needed for anxiety.  30 tablet  0  . metolazone (ZAROXOLYN) 5 MG tablet Take 5 mg by mouth once a week. Friday.      . Multiple Vitamin (MULTIVITAMIN WITH MINERALS) TABS tablet Take 1 tablet by mouth daily.      . pantoprazole (PROTONIX) 20 MG tablet TAKE 2 TABLETS BY MOUTH ONCE DAILY  60 tablet  0  . potassium chloride (K-DUR,KLOR-CON) 10 MEQ tablet Take 1 tablet (10 mEq total) by mouth 2 (two) times daily.  180 tablet  3  . torsemide (DEMADEX) 20 MG tablet Take 2 tablets (40 mg total) by mouth 2 (two) times daily.  540 tablet  0  . vitamin C (ASCORBIC ACID) 500 MG tablet Take 500 mg by mouth daily.       No current facility-administered medications for this encounter.   Filed Vitals:   12/10/13 1405  BP: 124/62  Pulse: 74   Weight: 183 lb 8 oz (83.235 kg)  SpO2: 100%   PHYSICAL EXAM: General:  Chronically ill appearing. No resp difficulty; in wheelchair HEENT: normal; trach with passy muire valve in place.  Neck: supple. JVD ~ 10 . Carotids 2+ bilaterally; no bruits. No lymphadenopathy or thryomegaly appreciated. Cor: PMI normal. Regular rate & rhythm. No rubs or gallops. +systolic murmur Lungs: Scattered  rhonchi throughout, on 2 liters O2 Abdomen: soft, nontender, nondistended. No hepatosplenomegaly. No bruits or masses. Good bowel sounds. Extremities: no cyanosis, clubbing, rash, no lower extremity edema Neuro: alert & orientedx3, cranial nerves grossly intact. Moves all 4 extremities w/o difficulty. Affect pleasant.  ASSESSMENT & PLAN:  1. Chronic diastolic HF:  EF 16% with moderate to severe LVH and normal RV (02/2013 echo).   - Stable NYHA III symptoms and volume status elevated. [place]. Continue torsemide to 40  mg twice a day. Take an extra 5 mg metolazone today. I would like her home weight to be less than 171 pounds. Continue metolazone 5 mg every Friday. - Continue potassium 10 meq BID  - Continue hydralazine to 150 mg TID. - Reinforced the need and importance of daily weights, a low sodium diet, and fluid restriction (less than 2 L a day). Instructed to call the HF clinic if weight increases more than 3 lbs overnight or 5 lbs in a week.   Continue paramedicine.  2. Chronic respiratory failure: COPD, OHS/OSA, chronic LLL collapse. Needs ongoing pulmonary follow up. She is on home oxygen.  Has Trach   3. CKD stage IV: baseline Cr 3.1-3.6. Has follow up with Dr. Hyman Hopes.  4. HTN: Stable. Continue current regimen.     Follow up next week to reassess volume status and check BMET   CLEGG,AMY NP-C  12/10/2013

## 2013-12-10 NOTE — Patient Instructions (Signed)
Follow up next week.   Take metolazone 5 mg today.   Do the following things EVERYDAY: 1) Weigh yourself in the morning before breakfast. Write it down and keep it in a log. 2) Take your medicines as prescribed 3) Eat low salt foods-Limit salt (sodium) to 2000 mg per day.  4) Stay as active as you can everyday 5) Limit all fluids for the day to less than 2 liters

## 2013-12-11 ENCOUNTER — Other Ambulatory Visit: Payer: Self-pay | Admitting: Internal Medicine

## 2013-12-13 ENCOUNTER — Encounter (HOSPITAL_COMMUNITY)
Admission: RE | Admit: 2013-12-13 | Discharge: 2013-12-13 | Disposition: A | Discharge status: Home / Self Care | Payer: Medicare Other | Source: Ambulatory Visit | Attending: Nephrology | Admitting: Nephrology

## 2013-12-13 DIAGNOSIS — D631 Anemia in chronic kidney disease: Secondary | ICD-10-CM | POA: Diagnosis not present

## 2013-12-13 LAB — POCT HEMOGLOBIN-HEMACUE: Hemoglobin: 10 g/dL — ABNORMAL LOW (ref 12.0–15.0)

## 2013-12-13 MED ORDER — EPOETIN ALFA 10000 UNIT/ML IJ SOLN
INTRAMUSCULAR | Status: AC
  Filled 2013-12-13: qty 1

## 2013-12-13 MED ORDER — EPOETIN ALFA 10000 UNIT/ML IJ SOLN
5000.0000 [IU] | INTRAMUSCULAR | Status: DC
  Administered 2013-12-13: 15:00:00 via SUBCUTANEOUS

## 2013-12-17 ENCOUNTER — Ambulatory Visit (HOSPITAL_COMMUNITY)
Admission: RE | Admit: 2013-12-17 | Discharge: 2013-12-17 | Disposition: A | Discharge status: Home / Self Care | Payer: Medicare Other | Source: Ambulatory Visit | Attending: Internal Medicine | Admitting: Internal Medicine

## 2013-12-17 ENCOUNTER — Encounter (HOSPITAL_COMMUNITY): Payer: Self-pay

## 2013-12-17 VITALS — BP 170/95 | HR 80 | Resp 18 | Wt 179.4 lb

## 2013-12-17 DIAGNOSIS — I5042 Chronic combined systolic (congestive) and diastolic (congestive) heart failure: Secondary | ICD-10-CM | POA: Insufficient documentation

## 2013-12-17 DIAGNOSIS — J449 Chronic obstructive pulmonary disease, unspecified: Secondary | ICD-10-CM | POA: Diagnosis not present

## 2013-12-17 DIAGNOSIS — E662 Morbid (severe) obesity with alveolar hypoventilation: Secondary | ICD-10-CM | POA: Insufficient documentation

## 2013-12-17 DIAGNOSIS — I252 Old myocardial infarction: Secondary | ICD-10-CM | POA: Diagnosis not present

## 2013-12-17 DIAGNOSIS — I110 Hypertensive heart disease with heart failure: Secondary | ICD-10-CM

## 2013-12-17 DIAGNOSIS — Z7982 Long term (current) use of aspirin: Secondary | ICD-10-CM | POA: Diagnosis not present

## 2013-12-17 DIAGNOSIS — J9610 Chronic respiratory failure, unspecified whether with hypoxia or hypercapnia: Secondary | ICD-10-CM | POA: Insufficient documentation

## 2013-12-17 DIAGNOSIS — N184 Chronic kidney disease, stage 4 (severe): Secondary | ICD-10-CM | POA: Insufficient documentation

## 2013-12-17 DIAGNOSIS — I472 Ventricular tachycardia: Secondary | ICD-10-CM | POA: Insufficient documentation

## 2013-12-17 DIAGNOSIS — Z9981 Dependence on supplemental oxygen: Secondary | ICD-10-CM | POA: Diagnosis not present

## 2013-12-17 DIAGNOSIS — I5032 Chronic diastolic (congestive) heart failure: Secondary | ICD-10-CM

## 2013-12-17 DIAGNOSIS — E119 Type 2 diabetes mellitus without complications: Secondary | ICD-10-CM | POA: Diagnosis not present

## 2013-12-17 DIAGNOSIS — I509 Heart failure, unspecified: Secondary | ICD-10-CM

## 2013-12-17 DIAGNOSIS — I129 Hypertensive chronic kidney disease with stage 1 through stage 4 chronic kidney disease, or unspecified chronic kidney disease: Secondary | ICD-10-CM | POA: Diagnosis not present

## 2013-12-17 DIAGNOSIS — Z8673 Personal history of transient ischemic attack (TIA), and cerebral infarction without residual deficits: Secondary | ICD-10-CM | POA: Insufficient documentation

## 2013-12-17 LAB — BASIC METABOLIC PANEL
Anion gap: 16 — ABNORMAL HIGH (ref 5–15)
BUN: 53 mg/dL — ABNORMAL HIGH (ref 6–23)
CALCIUM: 9.4 mg/dL (ref 8.4–10.5)
CO2: 25 mEq/L (ref 19–32)
Chloride: 102 mEq/L (ref 96–112)
Creatinine, Ser: 3.78 mg/dL — ABNORMAL HIGH (ref 0.50–1.10)
GFR, EST AFRICAN AMERICAN: 13 mL/min — AB (ref >90)
GFR, EST NON AFRICAN AMERICAN: 11 mL/min — AB (ref >90)
GLUCOSE: 95 mg/dL (ref 70–99)
Potassium: 3.5 mEq/L — ABNORMAL LOW (ref 3.7–5.3)
SODIUM: 143 meq/L (ref 137–147)

## 2013-12-17 NOTE — Patient Instructions (Signed)
Follow up in 4 weeks  Take torsemide 60 mg in am and 40 mg in pm   Do the following things EVERYDAY: 1) Weigh yourself in the morning before breakfast. Write it down and keep it in a log. 2) Take your medicines as prescribed 3) Eat low salt foods-Limit salt (sodium) to 2000 mg per day.  4) Stay as active as you can everyday 5) Limit all fluids for the day to less than 2 liters

## 2013-12-17 NOTE — Progress Notes (Signed)
Patient ID: Peggye FormMildred W Beigel, female   DOB: 1941-01-14, 73 y.o.   MRN: [id number] PCP: Dr. [facility]([address]) Pulmonologist: Dr. Shelle Ironlance Nephrologist: Dr. Hyman HopesWebb  HPI: 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 More recent cardiac history includes asystolic cardiac arrest 02/2012 in the setting of a/c respiratory failure, then NSTEMI/NSVT 06/2012 (felt type II, cath deferred due to CKD, started on Plavix).  RHC 09/13/11: mild pulmonary hypertension mean PA 28 with PVR 3, otherwise normal right heart pressures  2D echo 06/2012: EF 45%, severe concentric LVH, hypokinesis of inferolateral, inferior, and inferoseptal myocardium, grade 2 diastolic dysfunction, mild AS, moderately dilated LA/RA, mild-mod reduced RV systolic function, PA pressure 63mmHg, trivial pericardial effusion was identified posterior to the heart  Admitted to the hospital 10/06/2012 with complaints of SOB. This was her 10th admission over the last 1 year. She was diuresed with IV lasix and had a net negative of -15 liters and a discharge weight of 172 lbs. Nephrology has seen patient; she is not a dialysis candidate. Family rejected SNF placement.   Admitted 11/14. Diuresed with IV lasix.and transitioned to 40 mg torsemide twice a day.  Discharge weight 176 pounds.   Follow up: Last visit she had an extra 5 mg metolazone. Weight down from 176 to 170 pounds.  Overall she feels a little better.  Mild dyspnea in the house.  Remains essentially wheelchair bound. On 3 liters [place]. Weight at ome 171-176 pounds.  She is able to fill her own pill box. Has an Aide twice a day. Taking Iron every Thursday. She is followed by [facility].  .   Echo 03/21/13  EF 55% moderate to severe LVH. RV ok.   Labs (11/14): K 4.8, creatinine 3.58        (04/2013):  K+ 3.8, creatinine 3.11, pro-BNP 1474   (12/04/13) Creatinine 3.8 BUN 53 K 3.9           SH: Lives with daughter who is on dialysis.  No smoking.   ROS: All systems negative except as listed in HPI, PMH and Problem List.  Past Medical History  Diagnosis Date  . COPD (chronic obstructive pulmonary disease)     on home o2  . CVA (cerebral infarction)     hx  . Hypertensive heart disease with congestive heart failure   . Chronic cor pulmonale   . Chronic kidney disease (CKD), stage IV (severe)   . Hyperlipdemia   . Obesity (BMI 30-39.9)   . OSA (obstructive sleep apnea)     Does not use CPAP  . History of gout 09/07/2011  . Chronic combined systolic and diastolic CHF (congestive heart failure) 7/13    a. With cor pulmonale - EF 45-50%.  . Anemia   . Vascular disease   . Asthma   . Hypertension   . Diabetes mellitus   . GERD (gastroesophageal reflux disease)   . Arthritis   . RBBB   . Thrombocytopenia   . Acute and chronic respiratory failure     a. Trache placed 11/2011 for recurrent intubation (hypercarbic resp failure), post-intubation vocal cord edema, several issues since that time wih trache. b. On home O2 with trache.  . Obesity, unspecified 08/28/2012  . Obesity hypoventilation syndrome   . Lung collapse     a. Chronic LLL collapse.  .Marland Kitchen  Cardiac arrest     a. Asystolic cardiac arrest 02/2012 in setting of a/c respiratory failure (ischemic w/u has been deferred due to worsening CKD).  . NSTEMI (non-ST elevated myocardial infarction)     a. 06/2012 felt type II - troponin peak 1.45 - ischemic eval has been deferred due to CKD. Started on Plavix.  . Pulmonary HTN     a. mild pulm HTN by RHC 08/2011, felt due to lung disease. b. PA pressure 63mmHg by echo 06/2012.  Marland Kitchen. NSVT (nonsustained ventricular tachycardia)     a. Noted on tele, admission in 06/2012.  Marland Kitchen. Cirrhosis     a. Suspected on abd CT 12/2011.    Current Outpatient Prescriptions  Medication Sig Dispense Refill  .  acetaminophen (TYLENOL) 500 MG tablet Take 500 mg by mouth as needed.      Marland Kitchen. albuterol (PROVENTIL) (2.5 MG/3ML) 0.083% nebulizer solution Take 2.5 mg by nebulization every 6 (six) hours as needed for wheezing.      Marland Kitchen. amLODipine (NORVASC) 10 MG tablet Take 1 tablet (10 mg total) by mouth daily.  90 tablet  0  . aspirin 81 MG chewable tablet Chew 1 tablet (81 mg total) by mouth daily.      Marland Kitchen. atorvastatin (LIPITOR) 40 MG tablet Take 1 tablet (40 mg total) by mouth daily.  90 tablet  0  . busPIRone (BUSPAR) 5 MG tablet Take 1 tablet (5 mg total) by mouth 2 (two) times daily.  180 tablet  0  . carvedilol (COREG) 6.25 MG tablet Take 1 tablet (6.25 mg total) by mouth 2 (two) times daily with a meal.  180 tablet  0  . cloNIDine (CATAPRES) 0.2 MG tablet TAKE 1/2 TABLET BY MOUTH TWICE DAILY  60 tablet  3  . hydrALAZINE (APRESOLINE) 100 MG tablet TAKE 11/2 TABLETS (150MG  TOTAL) BY MOUTH THREE TIMES A DAY  90 tablet  2  . isosorbide mononitrate (IMDUR) 30 MG 24 hr tablet Take 1 tablet (30 mg total) by mouth daily.  90 tablet  0  . LORazepam (ATIVAN) 0.5 MG tablet Take 1 tablet (0.5 mg total) by mouth every 6 (six) hours as needed for anxiety.  30 tablet  0  . metolazone (ZAROXOLYN) 5 MG tablet Take 5 mg by mouth once a week. Friday.      . Multiple Vitamin (MULTIVITAMIN WITH MINERALS) TABS tablet Take 1 tablet by mouth daily.      . pantoprazole (PROTONIX) 20 MG tablet TAKE 2 TABLETS BY MOUTH ONCE DAILY  60 tablet  0  . potassium chloride (K-DUR,KLOR-CON) 10 MEQ tablet Take 1 tablet (10 mEq total) by mouth 2 (two) times daily.  180 tablet  3  . torsemide (DEMADEX) 20 MG tablet Take 2 tablets (40 mg total) by mouth 2 (two) times daily.  540 tablet  0  . vitamin C (ASCORBIC ACID) 500 MG tablet Take 500 mg by mouth daily.       No current facility-administered medications for this encounter.   Filed Vitals:   12/17/13 1513  BP: 170/95  Pulse: 80  Resp: 18  Weight: 179 lb 6 oz (81.364 kg)  SpO2: 100%    PHYSICAL EXAM: General:  Chronically ill appearing. No resp difficulty; in wheelchair HEENT: normal; trach  Neck: supple. JVD ~9-10 . Carotids 2+ bilaterally; no bruits. No lymphadenopathy or thryomegaly appreciated. Cor: PMI normal. Regular rate & rhythm. No rubs or gallops. +systolic murmur Lungs: Scattered rhonchi throughout, on 2 liters O2 Abdomen: obese, soft, nontender,  nondistended. No hepatosplenomegaly. No bruits or masses. Good bowel sounds. Extremities: no cyanosis, clubbing, rash, no lower extremity edema Neuro: alert & orientedx3, cranial nerves grossly intact. Moves all 4 extremities w/o difficulty. Affect pleasant.  ASSESSMENT & PLAN:  1. Chronic diastolic HF:  EF 16% with moderate to severe LVH and normal RV (02/2013 echo).   - Stable NYHA III symptoms and volume status mildy elevated. Increase torsemide to 60 mg in am and 40 mg in pm. I would like her home weight to be less than 171 pounds. Continue metolazone 5 mg every Friday. - Continue potassium 10 meq BID  - Continue hydralazine to 150 mg TID. -Check BMET  - Reinforced the need and importance of daily weights, a low sodium diet, and fluid restriction (less than 2 L a day). Instructed to call the HF clinic if weight increases more than 3 lbs overnight or 5 lbs in a week.   Continue paramedicine.  2. Chronic respiratory failure: COPD, OHS/OSA, chronic LLL collapse. She is on home oxygen.  Has Trach   3. CKD stage IV: baseline Cr 3.1-3.6. Has follow up with Dr. Hyman Hopes.  4. HTN: Elevated but she has not had her medications today.  For now will continue current regimen.     Follow up next week to reassess volume status and check BMET   CLEGG,AMY NP-C  12/17/2013

## 2013-12-20 ENCOUNTER — Telehealth (HOSPITAL_COMMUNITY): Payer: Self-pay

## 2013-12-20 ENCOUNTER — Encounter (HOSPITAL_COMMUNITY): Payer: Self-pay

## 2013-12-20 DIAGNOSIS — I5022 Chronic systolic (congestive) heart failure: Secondary | ICD-10-CM

## 2013-12-20 MED ORDER — POTASSIUM CHLORIDE CRYS ER 20 MEQ PO TBCR: 20.0000 meq | EXTENDED_RELEASE_TABLET | Freq: Two times a day (BID) | ORAL | Status: DC

## 2013-12-20 NOTE — Telephone Encounter (Signed)
Detailed message left on patient's VM stating to increase potassium to 20meq twice daily.  Advised tot ake 2 of current 10 meq tablets twice daily, then when she picks up new Rx it will be the 20meq tablets and she can take one of these twice daily.  Return phone number left for further questions/concerns.  [name], [name]

## 2013-12-26 ENCOUNTER — Telehealth: Payer: Self-pay | Admitting: *Deleted

## 2013-12-26 NOTE — Telephone Encounter (Signed)
Stefano GaulSusan Chilton with [facility] called and stated that patient has a trach and having a cough. Lungs are clear. And sputum is yellowish. Didn't want to wait with having her seen due to history and trach. She scheduled an appointment for next Tuesday, due to no available appointments sooner, to be evaluated but is also going to call the pulmonologist to see if they can get in sooner there. Agreed.

## 2013-12-26 NOTE — Telephone Encounter (Signed)
She likely has URI with lungs being clear. Is she on a cough expectorant? If not, to take mucinex 5 cc tid for 5 days, she will need suctioning to help pull the mucus out. If no improvement to be seen in clinic

## 2013-12-27 ENCOUNTER — Encounter (HOSPITAL_COMMUNITY)
Admission: RE | Admit: 2013-12-27 | Discharge: 2013-12-27 | Disposition: A | Discharge status: Home / Self Care | Payer: Medicare Other | Source: Ambulatory Visit | Attending: Nephrology | Admitting: Nephrology

## 2013-12-27 DIAGNOSIS — N184 Chronic kidney disease, stage 4 (severe): Secondary | ICD-10-CM | POA: Insufficient documentation

## 2013-12-27 DIAGNOSIS — D631 Anemia in chronic kidney disease: Secondary | ICD-10-CM | POA: Insufficient documentation

## 2013-12-27 LAB — POCT HEMOGLOBIN-HEMACUE: Hemoglobin: 11.1 g/dL — ABNORMAL LOW (ref 12.0–15.0)

## 2013-12-27 LAB — IRON AND TIBC
IRON: 52 ug/dL (ref 42–135)
Saturation Ratios: 24 % (ref 20–55)
TIBC: 217 ug/dL — AB (ref 250–470)
UIBC: 165 ug/dL (ref 125–400)

## 2013-12-27 LAB — FERRITIN: FERRITIN: 121 ng/mL (ref 10–291)

## 2013-12-27 MED ORDER — EPOETIN ALFA 10000 UNIT/ML IJ SOLN
5000.0000 [IU] | INTRAMUSCULAR | Status: DC
  Administered 2013-12-27: 5000 [IU] via SUBCUTANEOUS

## 2013-12-27 MED ORDER — EPOETIN ALFA 10000 UNIT/ML IJ SOLN
INTRAMUSCULAR | Status: AC
  Filled 2013-12-27: qty 1

## 2013-12-27 NOTE — Telephone Encounter (Signed)
LMOM for [name] to return call.

## 2013-12-27 NOTE — Telephone Encounter (Signed)
LMOM for [name] to return call. 

## 2013-12-28 ENCOUNTER — Ambulatory Visit (INDEPENDENT_AMBULATORY_CARE_PROVIDER_SITE_OTHER): Payer: Medicare Other | Admitting: Adult Health

## 2013-12-28 ENCOUNTER — Encounter: Payer: Self-pay | Admitting: Adult Health

## 2013-12-28 VITALS — BP 110/60 | HR 77 | Ht 64.0 in | Wt 164.0 lb

## 2013-12-28 DIAGNOSIS — J209 Acute bronchitis, unspecified: Secondary | ICD-10-CM

## 2013-12-28 MED ORDER — AMOXICILLIN-POT CLAVULANATE 875-125 MG PO TABS: 1.0000 | ORAL_TABLET | Freq: Two times a day (BID) | ORAL | Status: DC

## 2013-12-28 MED ORDER — AMOXICILLIN-POT CLAVULANATE 875-125 MG PO TABS: 1.0000 | ORAL_TABLET | Freq: Two times a day (BID) | ORAL | Status: AC

## 2013-12-28 NOTE — Addendum Note (Signed)
Addended by: Boone MasterJONES, Nahuel Wilbert E on: 12/28/2013 03:19 PM   Modules accepted: Orders

## 2013-12-28 NOTE — Progress Notes (Signed)
   Subjective:    Patient ID: [name], female    DOB: 11-16-1940, 73 y.o.   MRN: [id number]  HPI 73 yo female with Hx of Chronic respiratory failure secondary to chronic diastolic heart failure S/p  tracheostomy after an episode of chronic respiratory failure On 2 l/m O2.   12/28/2013 Acute OV Pt presents for an acute office visit. Complains of thick yellowish phlem for last 1-2 weeks  No otc used for tx. No recent travel or abx use.  No wheezing, chest tightness, hemoptysis, chest pain, edema or orthopnea.  Remains on O2 at 2l/m continuously.    Review of Systems Constitutional:   No  weight loss, night sweats,  Fevers, chills, _+ fatigue, or  lassitude.  HEENT:   No headaches,  Difficulty swallowing,  Tooth/dental problems, or  Sore throat,                No sneezing, itching, ear ache,  +nasal congestion, post nasal drip,   CV:  No chest pain,  Orthopnea, PND, swelling in lower extremities, anasarca, dizziness, palpitations, syncope.   GI  No heartburn, indigestion, abdominal pain, nausea, vomiting, diarrhea, change in bowel habits, loss of appetite, bloody stools.   Resp:    No chest wall deformity  Skin: no rash or lesions.  GU: no dysuria, change in color of urine, no urgency or frequency.  No flank pain, no hematuria   MS:  No joint pain or swelling.  No decreased range of motion.  No back pain.  Psych:  No change in mood or affect. No depression or anxiety.  No memory loss.         Objective:   Physical Exam GEN: A/Ox3; pleasant , NAD, chronically ill appearing   HEENT:  [place]/AT,  EACs-clear, TMs-wnl, NOSE-clear, THROAT-clear, no lesions, no postnasal drip or exudate noted.   NECK:  Supple w/ fair ROM; no JVD; normal carotid impulses w/o bruits; no thyromegaly or nodules palpated; no lymphadenopathy. Trach midline, no redness or drainage   RESP  Scattered rhonchi , no accessory muscle use, no dullness to percussion  CARD:  RRR, no m/r/g  , no  peripheral edema, pulses intact, no cyanosis or clubbing.  GI:   Soft & nt; nml bowel sounds; no organomegaly or masses detected.  Musco: Warm bil, no deformities or joint swelling noted.   Neuro: alert, no focal deficits noted.    Skin: Warm, no lesions or rashes         Assessment & Plan:

## 2013-12-28 NOTE — Patient Instructions (Addendum)
Begin Augmentin 875mg  Twice daily  For 7 days , take with food.  Mucinex DM Twice daily  As needed  Cough/congestion.  Fluids and rest  Continue on Oxygen.  Follow up Dr. Shelle Ironlance as planned  Please contact office for sooner follow up if symptoms do not improve or worsen or seek emergency care

## 2013-12-28 NOTE — Assessment & Plan Note (Signed)
Flare   Plan  Begin Augmentin 875mg  Twice daily  For 7 days , take with food.  Mucinex DM Twice daily  As needed  Cough/congestion.  Fluids and rest  Continue on Oxygen.  Follow up Dr. Shelle Ironlance as planned  Please contact office for sooner follow up if symptoms do not improve or worsen or seek emergency care

## 2013-12-31 NOTE — Progress Notes (Signed)
Ov reviewed and agree with plan as outlined.  

## 2014-01-01 ENCOUNTER — Ambulatory Visit: Payer: Medicare Other | Admitting: Internal Medicine

## 2014-01-03 ENCOUNTER — Encounter (HOSPITAL_COMMUNITY)
Admission: RE | Admit: 2014-01-03 | Discharge: 2014-01-03 | Disposition: A | Discharge status: Home / Self Care | Payer: Medicare Other | Source: Ambulatory Visit | Attending: Nephrology | Admitting: Nephrology

## 2014-01-03 DIAGNOSIS — D631 Anemia in chronic kidney disease: Secondary | ICD-10-CM | POA: Diagnosis not present

## 2014-01-03 LAB — POCT HEMOGLOBIN-HEMACUE: Hemoglobin: 9.9 g/dL — ABNORMAL LOW (ref 12.0–15.0)

## 2014-01-03 MED ORDER — EPOETIN ALFA 10000 UNIT/ML IJ SOLN
INTRAMUSCULAR | Status: AC
  Administered 2014-01-03: 5000 [IU] via SUBCUTANEOUS
  Filled 2014-01-03: qty 1

## 2014-01-03 MED ORDER — EPOETIN ALFA 10000 UNIT/ML IJ SOLN
5000.0000 [IU] | INTRAMUSCULAR | Status: DC
  Administered 2014-01-03: 5000 [IU] via SUBCUTANEOUS

## 2014-01-10 ENCOUNTER — Encounter (HOSPITAL_COMMUNITY)
Admission: RE | Admit: 2014-01-10 | Discharge: 2014-01-10 | Disposition: A | Discharge status: Home / Self Care | Payer: Medicare Other | Source: Ambulatory Visit | Attending: Nephrology | Admitting: Nephrology

## 2014-01-10 DIAGNOSIS — D638 Anemia in other chronic diseases classified elsewhere: Secondary | ICD-10-CM | POA: Insufficient documentation

## 2014-01-10 DIAGNOSIS — N184 Chronic kidney disease, stage 4 (severe): Secondary | ICD-10-CM | POA: Diagnosis not present

## 2014-01-10 LAB — POCT HEMOGLOBIN-HEMACUE: HEMOGLOBIN: 9.7 g/dL — AB (ref 12.0–15.0)

## 2014-01-10 MED ORDER — EPOETIN ALFA 10000 UNIT/ML IJ SOLN
5000.0000 [IU] | INTRAMUSCULAR | Status: DC
  Administered 2014-01-10: 5000 [IU] via SUBCUTANEOUS

## 2014-01-10 MED ORDER — EPOETIN ALFA 10000 UNIT/ML IJ SOLN
INTRAMUSCULAR | Status: AC
  Filled 2014-01-10: qty 1

## 2014-01-14 IMAGING — CR DG CHEST 1V PORT
1 series · 1 of 1 positions shown · non-contrast
Comparison: CT chest and chest radiograph 08/07/2008.

CLINICAL DATA: Shortness of breath and weakness.

PORTABLE CHEST - 1 VIEW

[AP]
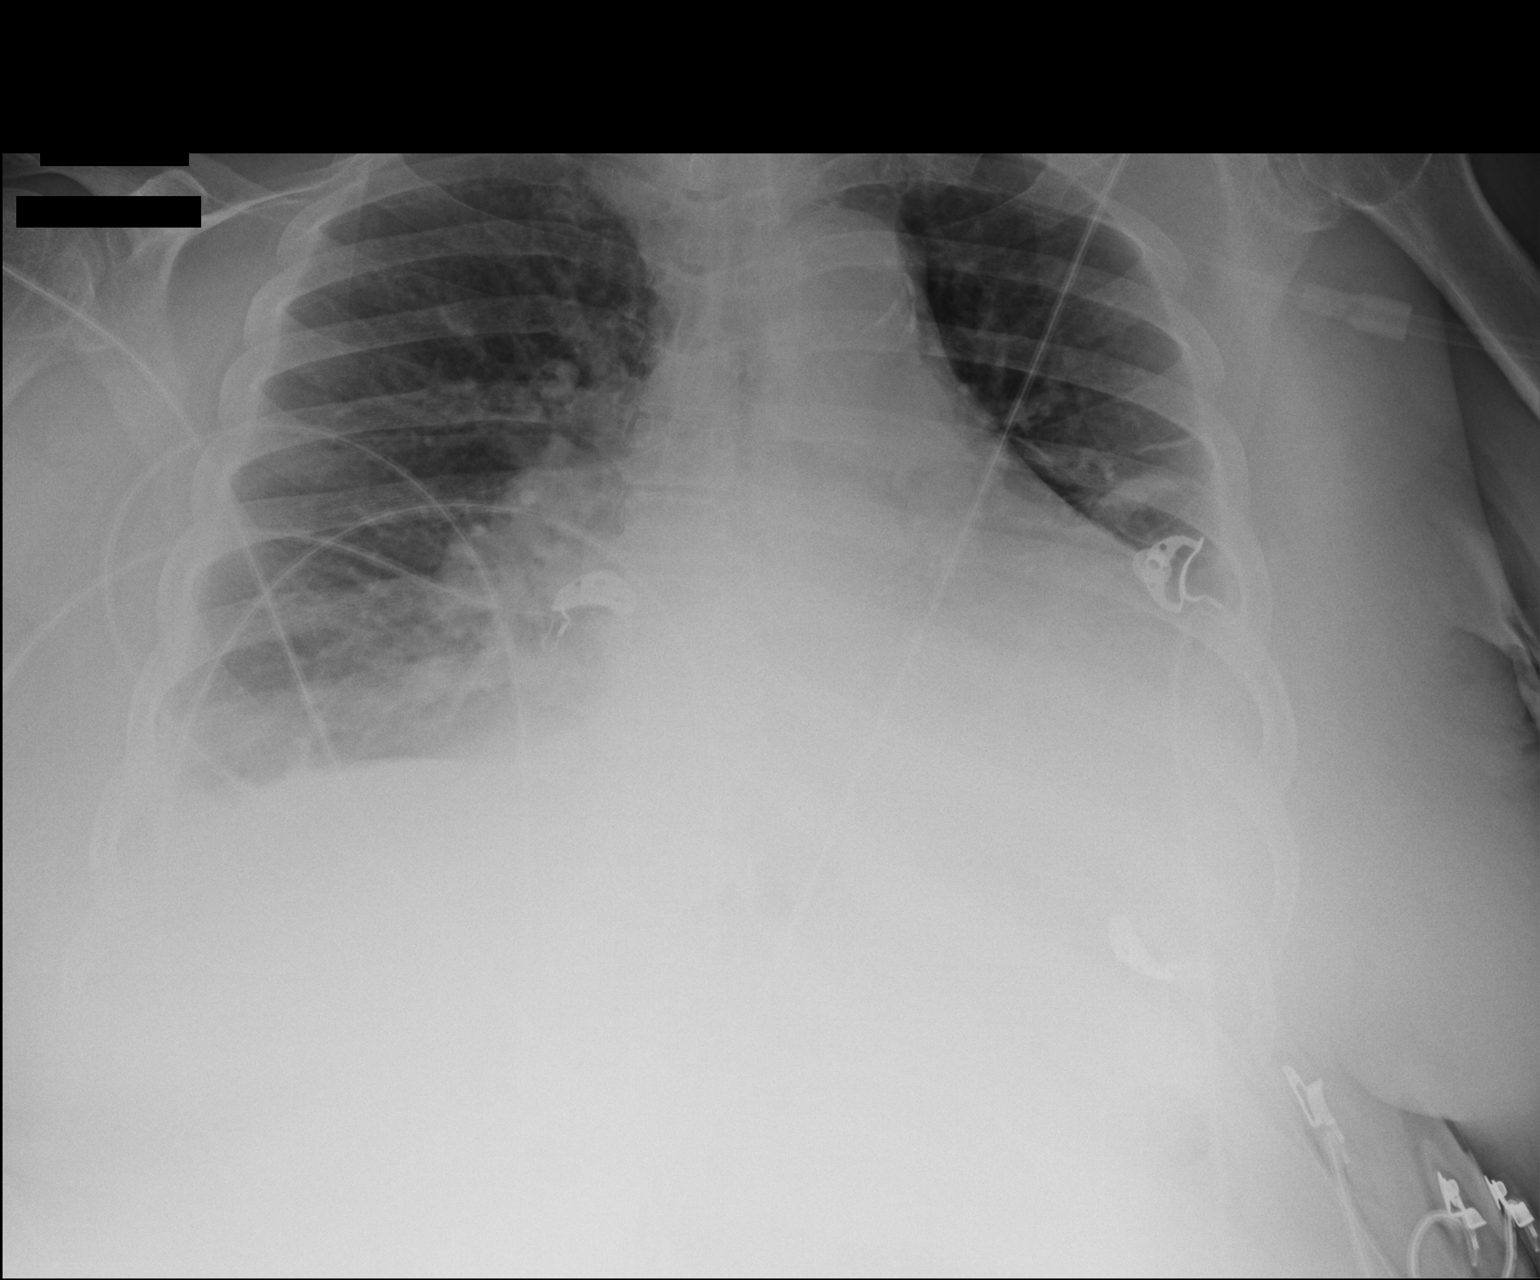

[1 of 1 positions shown; findings below may reference images not displayed]

FINDINGS: Trachea is midline.  Heart is enlarged.  Thoracic aorta
is calcified.  There is bibasilar air space disease with probable
bilateral pleural effusions.  Mild interstitial prominence in the
upper lung zones.
IMPRESSION: Probable mild congestive heart failure.  Follow-up to clearing is
recommended.

## 2014-01-15 ENCOUNTER — Encounter (HOSPITAL_COMMUNITY): Payer: Self-pay

## 2014-01-18 IMAGING — CR DG CHEST 1V PORT
1 series · 1 of 1 positions shown · non-contrast
Comparison: Portable chest 09/09/2011.

CLINICAL DATA: Respiratory distress.

PORTABLE CHEST - 1 VIEW

[AP]
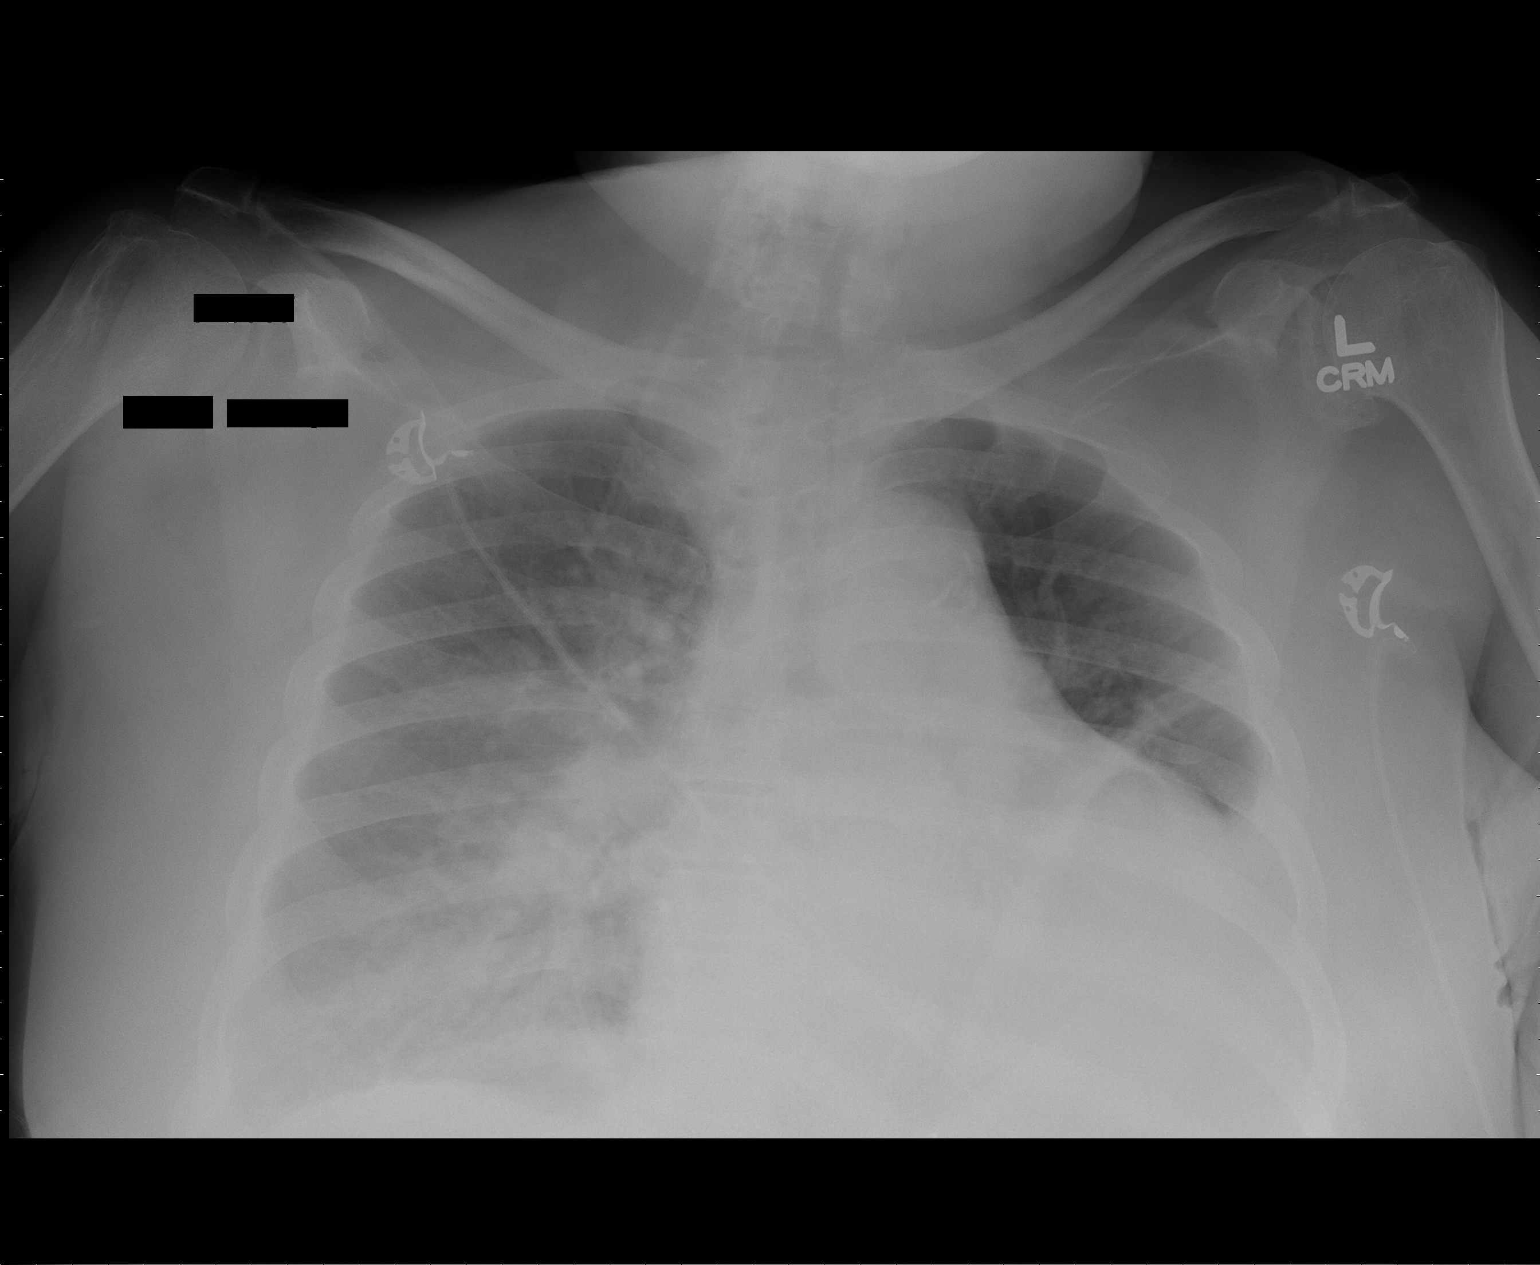

[1 of 1 positions shown; findings below may reference images not displayed]

FINDINGS: The heart is enlarged.  Retrocardiac opacification is
stable.  Pulmonary vascular congestion and bilateral pleural
effusions remain.  Mild right basilar airspace disease is
unchanged.
IMPRESSION: 1.  Stable appearance of retrocardiac opacification concerning for
atelectasis or infection.
2.  Persistent bilateral pleural effusions.
3.  Stable cardiomegaly and pulmonary vascular congestion.
4.  Mild atelectasis versus infection at the right lung base is
stable.

## 2014-01-24 ENCOUNTER — Encounter (HOSPITAL_COMMUNITY): Payer: Self-pay

## 2014-01-25 ENCOUNTER — Other Ambulatory Visit (HOSPITAL_COMMUNITY): Payer: Self-pay | Admitting: Anesthesiology

## 2014-01-25 ENCOUNTER — Other Ambulatory Visit (HOSPITAL_COMMUNITY): Payer: Self-pay | Admitting: Internal Medicine

## 2014-01-26 NOTE — Telephone Encounter (Signed)
Rx was sent to pharmacy electronically. Buspar refill deferred to Dr. Gala RomneyBensimhon

## 2014-01-31 ENCOUNTER — Encounter (HOSPITAL_COMMUNITY): Payer: Self-pay | Admitting: Cardiovascular Disease

## 2014-02-04 ENCOUNTER — Telehealth: Payer: Self-pay | Admitting: *Deleted

## 2014-02-04 NOTE — Telephone Encounter (Signed)
[facility] to let us know that patient has refused Transportation Services that is available to her to get her from appointment to appointment. Patient just tells her that she don't want the service and also doesn't want to disturb her daughter with having to take off and take her back and forths. [facility] just wanted to make us aware that they have tried to help patient in this area but she refuses.

## 2014-02-05 NOTE — Telephone Encounter (Signed)
noted 

## 2014-02-14 ENCOUNTER — Other Ambulatory Visit (HOSPITAL_COMMUNITY): Payer: Self-pay | Admitting: Internal Medicine

## 2014-02-21 ENCOUNTER — Other Ambulatory Visit (HOSPITAL_COMMUNITY): Payer: Self-pay | Admitting: Internal Medicine

## 2014-03-27 ENCOUNTER — Encounter: Payer: Medicare Other | Admitting: Internal Medicine

## 2014-03-29 ENCOUNTER — Emergency Department (HOSPITAL_COMMUNITY)
Admission: EM | Admit: 2014-03-29 | Discharge: 2014-03-29 | Disposition: A | Discharge status: Home / Self Care | Payer: Medicare Other | Attending: Emergency Medicine | Admitting: Emergency Medicine

## 2014-03-29 ENCOUNTER — Encounter (HOSPITAL_COMMUNITY): Payer: Self-pay

## 2014-03-29 ENCOUNTER — Emergency Department (HOSPITAL_COMMUNITY): Payer: Medicare Other

## 2014-03-29 DIAGNOSIS — Z8669 Personal history of other diseases of the nervous system and sense organs: Secondary | ICD-10-CM | POA: Diagnosis not present

## 2014-03-29 DIAGNOSIS — K219 Gastro-esophageal reflux disease without esophagitis: Secondary | ICD-10-CM | POA: Diagnosis not present

## 2014-03-29 DIAGNOSIS — I110 Hypertensive heart disease with heart failure: Secondary | ICD-10-CM | POA: Diagnosis not present

## 2014-03-29 DIAGNOSIS — E669 Obesity, unspecified: Secondary | ICD-10-CM | POA: Diagnosis not present

## 2014-03-29 DIAGNOSIS — J9503 Malfunction of tracheostomy stoma: Secondary | ICD-10-CM | POA: Insufficient documentation

## 2014-03-29 DIAGNOSIS — M1990 Unspecified osteoarthritis, unspecified site: Secondary | ICD-10-CM | POA: Diagnosis not present

## 2014-03-29 DIAGNOSIS — I5042 Chronic combined systolic (congestive) and diastolic (congestive) heart failure: Secondary | ICD-10-CM | POA: Insufficient documentation

## 2014-03-29 DIAGNOSIS — E119 Type 2 diabetes mellitus without complications: Secondary | ICD-10-CM | POA: Insufficient documentation

## 2014-03-29 DIAGNOSIS — J9509 Other tracheostomy complication: Secondary | ICD-10-CM

## 2014-03-29 DIAGNOSIS — E785 Hyperlipidemia, unspecified: Secondary | ICD-10-CM | POA: Insufficient documentation

## 2014-03-29 DIAGNOSIS — Z79899 Other long term (current) drug therapy: Secondary | ICD-10-CM | POA: Insufficient documentation

## 2014-03-29 DIAGNOSIS — Z87891 Personal history of nicotine dependence: Secondary | ICD-10-CM | POA: Diagnosis not present

## 2014-03-29 DIAGNOSIS — J441 Chronic obstructive pulmonary disease with (acute) exacerbation: Secondary | ICD-10-CM | POA: Insufficient documentation

## 2014-03-29 DIAGNOSIS — N184 Chronic kidney disease, stage 4 (severe): Secondary | ICD-10-CM | POA: Diagnosis not present

## 2014-03-29 DIAGNOSIS — Z7982 Long term (current) use of aspirin: Secondary | ICD-10-CM | POA: Diagnosis not present

## 2014-03-29 DIAGNOSIS — R0602 Shortness of breath: Secondary | ICD-10-CM | POA: Diagnosis present

## 2014-03-29 DIAGNOSIS — I252 Old myocardial infarction: Secondary | ICD-10-CM | POA: Diagnosis not present

## 2014-03-29 LAB — BASIC METABOLIC PANEL
Anion gap: 7 (ref 5–15)
BUN: 57 mg/dL — AB (ref 6–23)
CO2: 28 mmol/L (ref 19–32)
CREATININE: 3.96 mg/dL — AB (ref 0.50–1.10)
Calcium: 9.5 mg/dL (ref 8.4–10.5)
Chloride: 106 mmol/L (ref 96–112)
GFR calc Af Amer: 12 mL/min — ABNORMAL LOW (ref >90)
GFR calc non Af Amer: 10 mL/min — ABNORMAL LOW (ref >90)
GLUCOSE: 104 mg/dL — AB (ref 70–99)
POTASSIUM: 3.6 mmol/L (ref 3.5–5.1)
Sodium: 141 mmol/L (ref 135–145)

## 2014-03-29 LAB — CBC
HEMATOCRIT: 32.5 % — AB (ref 36.0–46.0)
HEMOGLOBIN: 10.2 g/dL — AB (ref 12.0–15.0)
MCH: 26 pg (ref 26.0–34.0)
MCHC: 31.4 g/dL (ref 30.0–36.0)
MCV: 82.9 fL (ref 78.0–100.0)
Platelets: 197 10*3/uL (ref 150–400)
RBC: 3.92 MIL/uL (ref 3.87–5.11)
RDW: 17.1 % — ABNORMAL HIGH (ref 11.5–15.5)
WBC: 7.9 10*3/uL (ref 4.0–10.5)

## 2014-03-29 LAB — I-STAT TROPONIN, ED: Troponin i, poc: 0.04 ng/mL (ref 0.00–0.08)

## 2014-03-29 LAB — BRAIN NATRIURETIC PEPTIDE: B Natriuretic Peptide: 254.1 pg/mL — ABNORMAL HIGH (ref 0.0–100.0)

## 2014-03-29 MED ORDER — LEVOFLOXACIN 750 MG PO TABS: 750.0000 mg | ORAL_TABLET | Freq: Every day | ORAL | Status: DC

## 2014-03-29 NOTE — ED Notes (Signed)
Pt not in room.

## 2014-03-29 NOTE — Discharge Instructions (Signed)
Care of a Tracheostomy Tube °Keeping the tracheostomy tube clean helps prevent infections and keeps certain trach tubing from plugging. A tracheostomy tube is commonly known as a trach tube. You may have one tube (an outer cannula), or you may have two tubes (an outer and inner cannula). The inner cannula that fits inside the outer cannula is removed for cleaning or replacement. Follow your caregiver's directions as to how often you should change and clean your trach tube.  °SUPPLIES NEEDED °· Towel. °· Suction supplies. °· Sterile trach care kit. °¨ 4x4 inch (10x10 cm) gauze pads. °¨ Sterile cotton-tipped swabs. °¨ Sterile trach bandage (dressing). °¨ Sterile container. °¨ 0.9% saline solution. °¨ Small sterile brush (or disposable inner cannula). °¨ Roll of twill tape, trach ties, or trach holder. °¨ Scissors. °· Clean gloves. °· Sterile gloves. °TRACHEOSTOMY CARE  °1. Have all supplies ready and available. °2. Wash hands well. °3. Put on clean gloves. °4. Suction the trach tube as needed. °5. When suctioning is complete, remove soiled trach dressings, gloves, and suction catheter. Throw away the dressings and coiled catheter in the glove. To coil the catheter, roll the catheter around the fingers. Then, pull the glove off inside out so that catheter remains coiled in glove. °6. Wash hands well. °7. Put on sterile gloves. °8. Fill a container 0.9% saline solution. °9. Give oxygen as needed. °10. Clean the inner cannula. °Nondisposable inner cannula. °· While only touching the outer part of the trach tube, unlock and remove the inner cannula. °· Drop the inner cannula into 0.9% saline solution. The saline will loosen secretions. °· Replace the trach collar, trach tube, or ventilator oxygen source over the outer cannula. Do not attach the trach tube and ventilator oxygen devices to all outer cannulas when the inner cannula is removed. °· Quickly pick up the inner cannula out of the saline solution. Use a small brush  to remove the secretions on the inside and outside of the inner cannula. °· Hold the inner cannula over the container. Rinse the cannula with 0.9% saline solution. °· Replace the inner cannula. Secure the locking mechanism. °· Give oxygen as needed. °Disposable inner cannula. °· Remove the new cannula from the packaging. °· Take out the inner cannula while touching only the outer part of the trach tube. °· Replace the old cannula with the new cannula. Lock it into position. °· Throw away the old cannula. °· Give oxygen as needed. °11. Clean the outer cannula surfaces with gauze or cotton swabs, extending 2-4 inches (5-10 cm) in all directions under the neck plate. Using a cotton swab, clean the stoma site in a circular motion from the stoma site outward. °12. Dry the skin and the outer cannula by patting the area gently with a dry gauze pad. °13. Secure the trach tube with trach ties or a trach tube holder. °14. Place a sterile dressing around the trach site. °15. Give oxygen as needed. °16. Throw away any used supplies. °17. Remove gloves. °18. Wash hands well. °Document Released: 03/23/2006 Document Revised: 01/26/2012 Document Reviewed: 09/17/2011 °[employer]® Patient Information ©2015 [employer]. This information is not intended to replace advice given to you by your health care provider. Make sure you discuss any questions you have with your health care provider. ° °

## 2014-03-29 NOTE — ED Notes (Signed)
Pt in room.  RT at bedside.

## 2014-03-29 NOTE — Progress Notes (Signed)
Trach changed at this time. MD at bedside. 4 cgs shiley removed, and another identical one replaced. Positive ETCO2 color change. Patient stated she feels much better, SAT 100% on 2 LNC. Old trach was completely occluded with dry secretions. Obturator given to patient's daughter. RT to monitor as needed.

## 2014-03-29 NOTE — ED Notes (Signed)
Respiratory called to eval pt regarding trach, sob, etc.

## 2014-03-29 NOTE — ED Notes (Signed)
Pt presents with worsening shortness of breath that she noted last night.  Pt denies any cough, reports vomiting this morning.  Pt reports missing inner trach tube, she dropped it down the drain x 2 weeks ago, the one that was sent to her was wrong size.   Pt denies any pain.

## 2014-03-29 NOTE — Progress Notes (Signed)
Called to evaluate trach paieint, but patient not in room. Will attempt in just a few minutes

## 2014-03-29 NOTE — ED Provider Notes (Signed)
CSN: [id number]     Arrival date & time 03/29/14  1332 History   First MD Initiated Contact with Patient 03/29/14 1501     No chief complaint on file.    (Consider location/radiation/quality/duration/timing/severity/associated sxs/prior Treatment) HPI Comments: Patient presents to the ER for evaluation of progressively worsening shortness of breath. Patient has had some cough, mostly nonproductive. Patient does have a trach. She reports that she dropped her inner cannula down the drain 2 weeks ago while cleaning it. She was sent a new one, but it was the wrong size. She was finally convinced to come to the ER by family because breathing worsened last night. No chest pain associated with difficulty breathing.   Past Medical History  Diagnosis Date  . COPD (chronic obstructive pulmonary disease)     on home o2  . CVA (cerebral infarction)     hx  . Hypertensive heart disease with congestive heart failure   . Chronic cor pulmonale   . Chronic kidney disease (CKD), stage IV (severe)   . Hyperlipdemia   . Obesity (BMI 30-39.9)   . OSA (obstructive sleep apnea)     Does not use CPAP  . History of gout 09/07/2011  . Chronic combined systolic and diastolic CHF (congestive heart failure) 7/13    a. With cor pulmonale - EF 45-50%.  . Anemia   . Vascular disease   . Asthma   . Hypertension   . Diabetes mellitus   . GERD (gastroesophageal reflux disease)   . Arthritis   . RBBB   . Thrombocytopenia   . Acute and chronic respiratory failure     a. Trache placed 11/2011 for recurrent intubation (hypercarbic resp failure), post-intubation vocal cord edema, several issues since that time wih trache. b. On home O2 with trache.  . Obesity, unspecified 08/28/2012  . Obesity hypoventilation syndrome   . Lung collapse     a. Chronic LLL collapse.  . Cardiac arrest     a. Asystolic cardiac arrest 02/2012 in setting of a/c respiratory failure (ischemic w/u has been deferred due to worsening CKD).  .  NSTEMI (non-ST elevated myocardial infarction)     a. 06/2012 felt type II - troponin peak 1.45 - ischemic eval has been deferred due to CKD. Started on Plavix.  . Pulmonary HTN     a. mild pulm HTN by [facility] 08/2011, felt due to lung disease. b. PA pressure 63mmHg by echo 06/2012.  Marland Kitchen. NSVT (nonsustained ventricular tachycardia)     a. Noted on tele, admission in 06/2012.  Marland Kitchen. Cirrhosis     a. Suspected on abd CT 12/2011.   Past Surgical History  Procedure Laterality Date  . Bilateral oophorectomy    . Cardiac catheterization      right heart cath  . Laparoscopic gastrostomy  12/21/2011    Procedure: LAPAROSCOPIC GASTROSTOMY;  Surgeon: Axel FillerArmando Ramirez, MD;  [facility];  Service: General;  Laterality: N/A;  laparoscopic gastrostomy possible open feeding tube  . Abdominal hysterectomy    . Tracheostomy  11/2011  . Cataracts    . Tracheostomy tube placement  02/18/2012    Procedure: TRACHEOSTOMY;  Surgeon: Serena ColonelJefry Rosen, MD;  [facility];  Service: ENT;  Laterality: N/A;  . Tracheostomy revision N/A 09/01/2012    Procedure: TRACHEOSTOMY REVISION;  Surgeon: Serena ColonelJefry Rosen, MD;  [facility];  Service: ENT;  Laterality: N/A;  . Right heart catheterization N/A 09/13/2011    Procedure: RIGHT HEART CATH;  Surgeon: Vesta MixerPhilip J Nahser, MD;  Location: [facility] CATH LAB;  Service: Cardiovascular;  Laterality: N/A;   Family History  Problem Relation Age of Onset  . Heart attack Father   . Stroke Mother     CVA  . Coronary artery disease Daughter   . Kidney failure Daughter   . Heart murmur Daughter   . Breast cancer Cousin   . Diabetes Cousin     Mother's side of family    History  Substance Use Topics  . Smoking status: Former Smoker -- 1.00 packs/day for 55 years    Types: Cigarettes    Quit date: 08/28/2011  . Smokeless tobacco: Never Used  . Alcohol Use: No   OB History    No data available     Review of Systems  Respiratory: Positive for shortness of breath.   All other systems reviewed  and are negative.     Allergies  Other and Sulfonamide derivatives  Home Medications   Prior to Admission medications   Medication Sig Start Date End Date Taking? Authorizing Provider  acetaminophen (TYLENOL) 500 MG tablet Take 500 mg by mouth as needed.    Historical Provider, MD  albuterol (PROVENTIL) (2.5 MG/3ML) 0.083% nebulizer solution Take 2.5 mg by nebulization every 6 (six) hours as needed for wheezing.    Historical Provider, MD  amLODipine (NORVASC) 10 MG tablet TAKE 1 TABLET BY MOUTH EVERY DAY **FOLLOW UP WITH MD FOR MORE REFILLS** 01/26/14   Dolores Patty, MD  amLODipine (NORVASC) 10 MG tablet TAKE 1 TABLET BY MOUTH EVERY DAY **FOLLOW UP WITH MD FOR MORE REFILLS** 02/14/14   Dolores Patty, MD  aspirin 81 MG chewable tablet Chew 1 tablet (81 mg total) by mouth daily. 02/19/12   Simonne Martinet, NP  atorvastatin (LIPITOR) 40 MG tablet Take 1 tablet (40 mg total) by mouth daily. ** Follow Up With Doctor For Refills ** 01/26/14   Dolores Patty, MD  atorvastatin (LIPITOR) 40 MG tablet TAKE 1 TABLET BY MOUTH ONCE DAILY 02/14/14   Dolores Patty, MD  busPIRone (BUSPAR) 5 MG tablet TAKE 1 TABLET BY MOUTH TWICE DAILY 02/25/14   Dolores Patty, MD  carvedilol (COREG) 6.25 MG tablet Take 1 tablet (6.25 mg total) by mouth 2 (two) times daily with a meal. ** Follow Up With Doctor For Refills ** 01/26/14   Dolores Patty, MD  carvedilol (COREG) 6.25 MG tablet TAKE 1 TABLET BY MOUTH TWICE DAILY WITH FOOD 02/14/14   Dolores Patty, MD  cloNIDine (CATAPRES) 0.2 MG tablet TAKE 1/2 TABLET BY MOUTH TWICE DAILY 10/31/13   Dolores Patty, MD  hydrALAZINE (APRESOLINE) 100 MG tablet TAKE 11/2 TABLETS (  TOTAL) BY MOUTH THREE TIMES A DAY 01/28/14   Aundria Rud, NP  isosorbide mononitrate (IMDUR) 30 MG 24 hr tablet Take 30 mg by mouth daily.  11/27/13   Dolores Patty, MD  isosorbide mononitrate (IMDUR) 30 MG 24 hr tablet Take 1 tablet (30 mg total) by mouth daily. **  Follow Up With Doctor For Refills ** 01/26/14   Dolores Patty, MD  LORazepam (ATIVAN) 0.5 MG tablet Take 1 tablet (0.5 mg total) by mouth every 6 (six) hours as needed for anxiety. 09/14/12   Christiane Ha, MD  metolazone (ZAROXOLYN) 5 MG tablet Take 5 mg by mouth once a week. Friday.    Historical Provider, MD  Multiple Vitamin (MULTIVITAMIN WITH MINERALS) TABS tablet Take 1 tablet by mouth daily.    Historical Provider, MD  pantoprazole (PROTONIX) 20 MG tablet TAKE 2 TABLETS BY MOUTH ONCE DAILY 12/19/13   Dolores Patty, MD  potassium chloride (K-DUR,KLOR-CON) 20 MEQ tablet Take 1 tablet (20 mEq total) by mouth 2 (two) times daily. 12/20/13   Amy D Clegg, NP  torsemide (DEMADEX) 20 MG tablet Take 40 mg by mouth 2 (two) times daily. Take  by mouth in the morning and  by mouth in the afternoon 11/27/13   Dolores Patty, MD  torsemide (DEMADEX) 20 MG tablet TAKE 2 TABLETS BY MOUTH TWICE DAILY 02/14/14   Dolores Patty, MD  vitamin C (ASCORBIC ACID) 500 MG tablet Take 500 mg by mouth daily.    Historical Provider, MD   BP 173/84 mmHg  Pulse 74  Temp(Src) 99.3 F (37.4 C) (Oral)  Resp 24  Ht  (1.626 m)  Wt 169 lb (76.658 kg)  BMI 28.99 kg/m2  SpO2 100% Physical Exam  Constitutional: She is oriented to person, place, and time. She appears well-developed and well-nourished. No distress.  HENT:  Head: Normocephalic and atraumatic.  Right Ear: Hearing normal.  Left Ear: Hearing normal.  Nose: Nose normal.  Mouth/Throat: Oropharynx is clear and moist and mucous membranes are normal.  Eyes: Conjunctivae and EOM are normal. Pupils are equal, round, and reactive to light.  Neck: Normal range of motion. Neck supple.  Cardiovascular: Regular rhythm, S1 normal and S2 normal.  Exam reveals no gallop and no friction rub.   No murmur heard. Pulmonary/Chest: Effort normal. No respiratory distress. She has rhonchi in the right lower field and the left lower field. She  exhibits no tenderness.  Abdominal: Soft. Normal appearance and bowel sounds are normal. There is no hepatosplenomegaly. There is no tenderness. There is no rebound, no guarding, no tenderness at McBurney's point and negative Murphy's sign. No hernia.  Musculoskeletal: Normal range of motion.  Neurological: She is alert and oriented to person, place, and time. She has normal strength. No cranial nerve deficit or sensory deficit. Coordination normal. GCS eye subscore is 4. GCS verbal subscore is 5. GCS motor subscore is 6.  Skin: Skin is warm, dry and intact. No rash noted. No cyanosis.  Psychiatric: She has a normal mood and affect. Her speech is normal and behavior is normal. Thought content normal.  Nursing note and vitals reviewed.   ED Course  Procedures (including critical care time) Labs Review Labs Reviewed  BASIC METABOLIC PANEL - Abnormal; Notable for the following:    Glucose, Bld 104 (*)    BUN 57 (*)    Creatinine, Ser 3.96 (*)    GFR calc non Af Amer 10 (*)    GFR calc Af Amer 12 (*)    All other components within normal limits  CBC - Abnormal; Notable for the following:    Hemoglobin 10.2 (*)    HCT 32.5 (*)    RDW 17.1 (*)    All other components within normal limits  BRAIN NATRIURETIC PEPTIDE - Abnormal; Notable for the following:    B Natriuretic Peptide 254.1 (*)    All other components within normal limits  I-STAT TROPOININ, ED    Imaging Review Dg Chest 2 View  03/29/2014   CLINICAL DATA:  Short of breath 2 days  EXAM: CHEST  2 VIEW  COMPARISON:  08/30/2013  FINDINGS: Tracheostomy tube unchanged. Stable enlarged cardiac silhouette. Low lung volumes. There is discoid atelectasis in the lingula. No pulmonary edema. No focal infiltrate.  IMPRESSION: Discoid atelectasis in the  lingula. Consider CT thorax for further evaluation or at minimum recommend follow-up radiographs to ensure resolution.   Electronically Signed   By: Genevive Bi M.D.   On: 03/29/2014 15:09      EKG Interpretation   Date/Time:  Friday March 29 2014 13:45:26 EST Ventricular Rate:  82 PR Interval:  202 QRS Duration: 160 QT Interval:  440 QTC Calculation: 514 R Axis:   -42 Text Interpretation:  Sinus rhythm with Premature atrial complexes Left  axis deviation Right bundle branch block Left ventricular hypertrophy T  wave abnormality, consider lateral ischemia Abnormal ECG No significant  change since last tracing Confirmed by Zaydn Gutridge  MD, Gladyce Mcray [id number]) on  03/29/2014 3:10:49 PM      MDM   Final diagnoses:  None   tracheostomy problem Possible community-acquired pneumonia  Patient presents to the ER for evaluation of shortness of breath. Patient has a history of tracheostomy. Removal of her inner cannula revealed that it was completely occluded. This was replaced and she is breathing much more comfortably. Patient does have lingula atelectasis, combined with cough and some rhonchi on exam, cannot rule out early infectious process. Patient will be initiated on Levaquin, is appropriate for discharge as her oxygen saturations are 100%. Follow-up with PCP next week for repeat x-ray. Return if symptoms worsen.    Gilda Crease, MD 03/29/14 [phone]

## 2014-04-03 ENCOUNTER — Emergency Department (HOSPITAL_COMMUNITY): Payer: Medicare Other

## 2014-04-03 ENCOUNTER — Inpatient Hospital Stay (HOSPITAL_COMMUNITY)
Admission: EM | Admit: 2014-04-03 | Discharge: 2014-04-11 | DRG: 193 | Disposition: A | Discharge status: Home / Self Care | Payer: Medicare Other | Attending: Internal Medicine | Admitting: Internal Medicine

## 2014-04-03 ENCOUNTER — Encounter (HOSPITAL_COMMUNITY): Payer: Self-pay | Admitting: *Deleted

## 2014-04-03 DIAGNOSIS — N185 Chronic kidney disease, stage 5: Secondary | ICD-10-CM | POA: Diagnosis present

## 2014-04-03 DIAGNOSIS — I5042 Chronic combined systolic (congestive) and diastolic (congestive) heart failure: Secondary | ICD-10-CM | POA: Diagnosis present

## 2014-04-03 DIAGNOSIS — I272 Other secondary pulmonary hypertension: Secondary | ICD-10-CM | POA: Diagnosis present

## 2014-04-03 DIAGNOSIS — K219 Gastro-esophageal reflux disease without esophagitis: Secondary | ICD-10-CM | POA: Diagnosis present

## 2014-04-03 DIAGNOSIS — E119 Type 2 diabetes mellitus without complications: Secondary | ICD-10-CM | POA: Diagnosis present

## 2014-04-03 DIAGNOSIS — Z9981 Dependence on supplemental oxygen: Secondary | ICD-10-CM

## 2014-04-03 DIAGNOSIS — J189 Pneumonia, unspecified organism: Principal | ICD-10-CM | POA: Diagnosis present

## 2014-04-03 DIAGNOSIS — Z79899 Other long term (current) drug therapy: Secondary | ICD-10-CM | POA: Diagnosis not present

## 2014-04-03 DIAGNOSIS — Z930 Tracheostomy status: Secondary | ICD-10-CM | POA: Diagnosis not present

## 2014-04-03 DIAGNOSIS — J0410 Acute tracheitis without obstruction: Secondary | ICD-10-CM | POA: Diagnosis present

## 2014-04-03 DIAGNOSIS — I1310 Hypertensive heart and chronic kidney disease without heart failure, with stage 1 through stage 4 chronic kidney disease, or unspecified chronic kidney disease: Secondary | ICD-10-CM | POA: Diagnosis present

## 2014-04-03 DIAGNOSIS — E785 Hyperlipidemia, unspecified: Secondary | ICD-10-CM | POA: Diagnosis present

## 2014-04-03 DIAGNOSIS — J440 Chronic obstructive pulmonary disease with acute lower respiratory infection: Secondary | ICD-10-CM | POA: Diagnosis present

## 2014-04-03 DIAGNOSIS — J441 Chronic obstructive pulmonary disease with (acute) exacerbation: Secondary | ICD-10-CM | POA: Diagnosis present

## 2014-04-03 DIAGNOSIS — E669 Obesity, unspecified: Secondary | ICD-10-CM | POA: Diagnosis present

## 2014-04-03 DIAGNOSIS — G4733 Obstructive sleep apnea (adult) (pediatric): Secondary | ICD-10-CM | POA: Diagnosis present

## 2014-04-03 DIAGNOSIS — N179 Acute kidney failure, unspecified: Secondary | ICD-10-CM | POA: Diagnosis present

## 2014-04-03 DIAGNOSIS — O10019 Pre-existing essential hypertension complicating pregnancy, unspecified trimester: Secondary | ICD-10-CM | POA: Insufficient documentation

## 2014-04-03 DIAGNOSIS — R0602 Shortness of breath: Secondary | ICD-10-CM | POA: Diagnosis present

## 2014-04-03 DIAGNOSIS — Z6830 Body mass index (BMI) 30.0-30.9, adult: Secondary | ICD-10-CM | POA: Diagnosis not present

## 2014-04-03 DIAGNOSIS — J9621 Acute and chronic respiratory failure with hypoxia: Secondary | ICD-10-CM | POA: Diagnosis present

## 2014-04-03 DIAGNOSIS — N184 Chronic kidney disease, stage 4 (severe): Secondary | ICD-10-CM | POA: Diagnosis present

## 2014-04-03 DIAGNOSIS — I5043 Acute on chronic combined systolic (congestive) and diastolic (congestive) heart failure: Secondary | ICD-10-CM | POA: Diagnosis present

## 2014-04-03 DIAGNOSIS — J811 Chronic pulmonary edema: Secondary | ICD-10-CM

## 2014-04-03 DIAGNOSIS — Z9119 Patient's noncompliance with other medical treatment and regimen: Secondary | ICD-10-CM | POA: Diagnosis present

## 2014-04-03 DIAGNOSIS — Z8674 Personal history of sudden cardiac arrest: Secondary | ICD-10-CM

## 2014-04-03 DIAGNOSIS — K7460 Unspecified cirrhosis of liver: Secondary | ICD-10-CM | POA: Diagnosis present

## 2014-04-03 DIAGNOSIS — J9811 Atelectasis: Secondary | ICD-10-CM | POA: Diagnosis present

## 2014-04-03 DIAGNOSIS — I4510 Unspecified right bundle-branch block: Secondary | ICD-10-CM | POA: Diagnosis present

## 2014-04-03 DIAGNOSIS — Z7982 Long term (current) use of aspirin: Secondary | ICD-10-CM | POA: Diagnosis not present

## 2014-04-03 DIAGNOSIS — E662 Morbid (severe) obesity with alveolar hypoventilation: Secondary | ICD-10-CM | POA: Diagnosis present

## 2014-04-03 DIAGNOSIS — I2781 Cor pulmonale (chronic): Secondary | ICD-10-CM | POA: Diagnosis present

## 2014-04-03 DIAGNOSIS — I10 Essential (primary) hypertension: Secondary | ICD-10-CM | POA: Diagnosis present

## 2014-04-03 DIAGNOSIS — Z9989 Dependence on other enabling machines and devices: Secondary | ICD-10-CM

## 2014-04-03 DIAGNOSIS — R0902 Hypoxemia: Secondary | ICD-10-CM

## 2014-04-03 DIAGNOSIS — N189 Chronic kidney disease, unspecified: Secondary | ICD-10-CM

## 2014-04-03 LAB — CBC WITH DIFFERENTIAL/PLATELET
Basophils Absolute: 0 10*3/uL (ref 0.0–0.1)
Basophils Relative: 0 % (ref 0–1)
EOS PCT: 2 % (ref 0–5)
Eosinophils Absolute: 0.2 10*3/uL (ref 0.0–0.7)
HEMATOCRIT: 31.2 % — AB (ref 36.0–46.0)
Hemoglobin: 9.8 g/dL — ABNORMAL LOW (ref 12.0–15.0)
LYMPHS PCT: 21 % (ref 12–46)
Lymphs Abs: 1.7 10*3/uL (ref 0.7–4.0)
MCH: 25.9 pg — ABNORMAL LOW (ref 26.0–34.0)
MCHC: 31.4 g/dL (ref 30.0–36.0)
MCV: 82.5 fL (ref 78.0–100.0)
MONO ABS: 0.7 10*3/uL (ref 0.1–1.0)
Monocytes Relative: 8 % (ref 3–12)
NEUTROS ABS: 5.7 10*3/uL (ref 1.7–7.7)
Neutrophils Relative %: 69 % (ref 43–77)
Platelets: 189 10*3/uL (ref 150–400)
RBC: 3.78 MIL/uL — AB (ref 3.87–5.11)
RDW: 16.9 % — ABNORMAL HIGH (ref 11.5–15.5)
WBC: 8.2 10*3/uL (ref 4.0–10.5)

## 2014-04-03 LAB — COMPREHENSIVE METABOLIC PANEL
ALBUMIN: 3.4 g/dL — AB (ref 3.5–5.2)
ALK PHOS: 93 U/L (ref 39–117)
ALT: 7 U/L (ref 0–35)
ANION GAP: 10 (ref 5–15)
AST: 14 U/L (ref 0–37)
BUN: 44 mg/dL — ABNORMAL HIGH (ref 6–23)
CO2: 24 mmol/L (ref 19–32)
Calcium: 9.5 mg/dL (ref 8.4–10.5)
Chloride: 109 mmol/L (ref 96–112)
Creatinine, Ser: 4.01 mg/dL — ABNORMAL HIGH (ref 0.50–1.10)
GFR calc non Af Amer: 10 mL/min — ABNORMAL LOW (ref >90)
GFR, EST AFRICAN AMERICAN: 12 mL/min — AB (ref >90)
Glucose, Bld: 95 mg/dL (ref 70–99)
POTASSIUM: 3.7 mmol/L (ref 3.5–5.1)
Sodium: 143 mmol/L (ref 135–145)
Total Bilirubin: 0.7 mg/dL (ref 0.3–1.2)
Total Protein: 6.5 g/dL (ref 6.0–8.3)

## 2014-04-03 LAB — TROPONIN I: Troponin I: 0.05 ng/mL — ABNORMAL HIGH (ref <0.031)

## 2014-04-03 LAB — I-STAT CG4 LACTIC ACID, ED: LACTIC ACID, VENOUS: 0.7 mmol/L (ref 0.5–2.0)

## 2014-04-03 LAB — BRAIN NATRIURETIC PEPTIDE: B Natriuretic Peptide: 190.4 pg/mL — ABNORMAL HIGH (ref 0.0–100.0)

## 2014-04-03 LAB — MRSA PCR SCREENING: MRSA BY PCR: NEGATIVE

## 2014-04-03 LAB — PROCALCITONIN: Procalcitonin: <0.1 ng/mL

## 2014-04-03 MED ORDER — ENOXAPARIN SODIUM 30 MG/0.3ML ~~LOC~~ SOLN
30.0000 mg | SUBCUTANEOUS | Status: DC
Start: 2014-04-03 — End: 2014-04-11
  Administered 2014-04-03 – 2014-04-10 (×8): 30 mg via SUBCUTANEOUS
  Filled 2014-04-03 (×9): qty 0.3

## 2014-04-03 MED ORDER — OXYCODONE HCL 5 MG PO TABS
5.0000 mg | ORAL_TABLET | ORAL | Status: DC | PRN
  Administered 2014-04-03 – 2014-04-11 (×13): 5 mg via ORAL
  Filled 2014-04-03 (×13): qty 1

## 2014-04-03 MED ORDER — SODIUM CHLORIDE 0.9 % IV SOLN
INTRAVENOUS | Status: DC
  Administered 2014-04-03 – 2014-04-05 (×3): 50 mL/h via INTRAVENOUS

## 2014-04-03 MED ORDER — BUSPIRONE HCL 5 MG PO TABS
5.0000 mg | ORAL_TABLET | Freq: Two times a day (BID) | ORAL | Status: DC
  Administered 2014-04-03 – 2014-04-11 (×17): 5 mg via ORAL
  Filled 2014-04-03 (×18): qty 1

## 2014-04-03 MED ORDER — SODIUM CHLORIDE 0.9 % IJ SOLN
3.0000 mL | Freq: Two times a day (BID) | INTRAMUSCULAR | Status: DC
  Administered 2014-04-05 – 2014-04-11 (×9): 3 mL via INTRAVENOUS

## 2014-04-03 MED ORDER — ALBUTEROL SULFATE (2.5 MG/3ML) 0.083% IN NEBU: 2.5000 mg | INHALATION_SOLUTION | RESPIRATORY_TRACT | Status: DC | PRN

## 2014-04-03 MED ORDER — ACETAMINOPHEN 650 MG RE SUPP: 650.0000 mg | Freq: Four times a day (QID) | RECTAL | Status: DC | PRN

## 2014-04-03 MED ORDER — PANTOPRAZOLE SODIUM 40 MG PO TBEC
40.0000 mg | DELAYED_RELEASE_TABLET | Freq: Every day | ORAL | Status: DC
  Administered 2014-04-03 – 2014-04-11 (×9): 40 mg via ORAL
  Filled 2014-04-03 (×9): qty 1

## 2014-04-03 MED ORDER — AMLODIPINE BESYLATE 10 MG PO TABS
10.0000 mg | ORAL_TABLET | Freq: Every day | ORAL | Status: DC
  Administered 2014-04-03 – 2014-04-11 (×9): 10 mg via ORAL
  Filled 2014-04-03 (×8): qty 1
  Filled 2014-04-03: qty 2

## 2014-04-03 MED ORDER — METOLAZONE 5 MG PO TABS
5.0000 mg | ORAL_TABLET | ORAL | Status: DC
  Administered 2014-04-04: 5 mg via ORAL
  Filled 2014-04-03 (×2): qty 1

## 2014-04-03 MED ORDER — ACETAMINOPHEN 325 MG PO TABS
650.0000 mg | ORAL_TABLET | Freq: Four times a day (QID) | ORAL | Status: DC | PRN
Start: 2014-04-03 — End: 2014-04-11
  Administered 2014-04-05 – 2014-04-06 (×2): 650 mg via ORAL
  Filled 2014-04-03 (×2): qty 2

## 2014-04-03 MED ORDER — ONDANSETRON HCL 4 MG/2ML IJ SOLN
4.0000 mg | Freq: Four times a day (QID) | INTRAMUSCULAR | Status: DC | PRN
Start: 2014-04-03 — End: 2014-04-11
  Administered 2014-04-05: 4 mg via INTRAVENOUS

## 2014-04-03 MED ORDER — ISOSORBIDE MONONITRATE ER 30 MG PO TB24
30.0000 mg | ORAL_TABLET | Freq: Every day | ORAL | Status: DC
  Administered 2014-04-03 – 2014-04-06 (×4): 30 mg via ORAL
  Filled 2014-04-03 (×5): qty 1

## 2014-04-03 MED ORDER — ASPIRIN 81 MG PO CHEW
81.0000 mg | CHEWABLE_TABLET | Freq: Every day | ORAL | Status: DC
  Administered 2014-04-03 – 2014-04-11 (×9): 81 mg via ORAL
  Filled 2014-04-03 (×9): qty 1

## 2014-04-03 MED ORDER — ATORVASTATIN CALCIUM 40 MG PO TABS
40.0000 mg | ORAL_TABLET | Freq: Every day | ORAL | Status: DC
  Administered 2014-04-03 – 2014-04-05 (×3): 40 mg via ORAL
  Filled 2014-04-03 (×3): qty 1

## 2014-04-03 MED ORDER — ALBUTEROL SULFATE (2.5 MG/3ML) 0.083% IN NEBU
2.5000 mg | INHALATION_SOLUTION | RESPIRATORY_TRACT | Status: DC
  Administered 2014-04-03 – 2014-04-04 (×5): 2.5 mg via RESPIRATORY_TRACT
  Filled 2014-04-03 (×5): qty 3

## 2014-04-03 MED ORDER — CARVEDILOL 6.25 MG PO TABS
6.2500 mg | ORAL_TABLET | Freq: Two times a day (BID) | ORAL | Status: DC
  Administered 2014-04-03 – 2014-04-11 (×16): 6.25 mg via ORAL
  Filled 2014-04-03 (×19): qty 1

## 2014-04-03 MED ORDER — LORAZEPAM 0.5 MG PO TABS
0.5000 mg | ORAL_TABLET | Freq: Every day | ORAL | Status: DC | PRN
  Administered 2014-04-04 – 2014-04-07 (×2): 0.5 mg via ORAL
  Filled 2014-04-03 (×2): qty 1

## 2014-04-03 MED ORDER — ONDANSETRON HCL 4 MG PO TABS
4.0000 mg | ORAL_TABLET | Freq: Four times a day (QID) | ORAL | Status: DC | PRN
Start: 2014-04-03 — End: 2014-04-11

## 2014-04-03 MED ORDER — ADULT MULTIVITAMIN W/MINERALS CH
1.0000 | ORAL_TABLET | Freq: Every day | ORAL | Status: DC
Start: 2014-04-03 — End: 2014-04-11
  Administered 2014-04-03 – 2014-04-11 (×9): 1 via ORAL
  Filled 2014-04-03 (×9): qty 1

## 2014-04-03 NOTE — Progress Notes (Signed)
Patient placed on 28% trach collar.  Sats currently 100%.  Sent sputum sample down to main lab for patient per MD order.  Will continue to monitor.

## 2014-04-03 NOTE — H&P (Signed)
History and Physical  [name] ZOX:[id number] DOB: July 28, 1940 DOA: 04/03/2014  Referring physician: Dutch Quint, ER Physician PCP: Oneal Grout, MD   Chief Complaint: Can't breathe  HPI: [name] is a 74 y.o. female  Past mental history of chronic respiratory failure and COPD and chronic trach plus systolic/diastolic heart failure + stage IV chronic kidney disease who has a history of noncompliance in terms of treatment management. 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 Emergency and was able to replace it at that time and patient was discharged on by mouth Levaquin.  She returned the emergency room on 2/10 morning after she woke up last night feeling that she could not catch her breath. She has baseline of 2 L oxygen but was found to have oxygen saturations in the 80s and required up to 5 L by trach mask. She was also given albuterol. Lab work was unrevealing. Chest x-ray unrevealing. Patient was noted to have large amount of secretions coming from her trach, which she states been going on for the past 1-2 days. Hospitalists were called for further evaluation and admission.   Review of Systems:  Patient seen in emergency room. Pt complains of still some trouble breathing, although improved from when she first came in. She still having some wheezing. She also complains of tracheal secretions. Patient has chronic disability and is unable to walk.  Pt denies any headaches, vision changes, dysphagia, chest pain, palpitations, abdominal pain, hematuria, dysuria, constipation, diarrhea, focal extremity numbness or weakness or pain other than her chronic weakness.  Review of systems are otherwise negative  Past Medical History  Diagnosis Date  . COPD  (chronic obstructive pulmonary disease)     on home o2  . CVA (cerebral infarction)     hx  . Hypertensive heart disease with congestive heart failure   . Chronic cor pulmonale   . Chronic kidney disease (CKD), stage IV (severe)   . Hyperlipdemia   . Obesity (BMI 30-39.9)   . OSA (obstructive sleep apnea)     Does not use CPAP  . History of gout 09/07/2011  . Chronic combined systolic and diastolic CHF (congestive heart failure) 7/13    a. With cor pulmonale - EF 45-50%.  . Anemia   . Vascular disease   . Asthma   . Hypertension   . GERD (gastroesophageal reflux disease)   . Arthritis   . RBBB   . Thrombocytopenia   . Acute and chronic respiratory failure     a. Trache placed 11/2011 for recurrent intubation (hypercarbic resp failure), post-intubation vocal cord edema, several issues since that time wih trache. b. On home O2 with trache.  . Obesity, unspecified 08/28/2012  . Obesity hypoventilation syndrome   . Lung collapse     a. Chronic LLL collapse.  . Cardiac arrest     a. Asystolic cardiac arrest 02/2012 in setting of a/c respiratory failure (ischemic w/u has been deferred due to worsening CKD).  . NSTEMI (non-ST elevated myocardial infarction)     a. 06/2012 felt type II - troponin peak 1.45 - ischemic eval has been deferred due to CKD. Started on Plavix.  . Pulmonary HTN     a. mild pulm HTN by [facility] 08/2011, felt due to lung disease. b.  PA pressure by echo 06/2012.  Marland Kitchen NSVT (nonsustained ventricular tachycardia)     a. Noted on tele, admission in 06/2012.  Marland Kitchen Cirrhosis     a. Suspected on abd CT 12/2011.   Past Surgical History  Procedure Laterality Date  . Bilateral oophorectomy    . Cardiac catheterization      right heart cath  . Laparoscopic gastrostomy  12/21/2011    Procedure: LAPAROSCOPIC GASTROSTOMY;  Surgeon: Axel Filler, MD;  Location: [facility] OR;  Service: General;  Laterality: N/A;  laparoscopic gastrostomy possible open feeding tube  . Abdominal  hysterectomy    . Tracheostomy  11/2011  . Cataracts    . Tracheostomy tube placement  02/18/2012    Procedure: TRACHEOSTOMY;  Surgeon: Serena Colonel, MD;  Location: [facility] OR;  Service: ENT;  Laterality: N/A;  . Tracheostomy revision N/A 09/01/2012    Procedure: TRACHEOSTOMY REVISION;  Surgeon: Serena Colonel, MD;  Location: [facility] OR;  Service: ENT;  Laterality: N/A;  . Right heart catheterization N/A 09/13/2011    Procedure: RIGHT HEART CATH;  Surgeon: Vesta Mixer, MD;  Location: [facility] CATH LAB;  Service: Cardiovascular;  Laterality: N/A;   Social History:  reports that she quit smoking about 2 years ago. Her smoking use included Cigarettes. She has a 55 pack-year smoking history. She has never used smokeless tobacco. She reports that she does not drink alcohol or use illicit drugs. Patient lives at home with her daughter & is able to get around using only a wheelchair.   Allergies  Allergen Reactions  . Other Itching    "Wool"  . Sulfonamide Derivatives Hives and Rash    Family History  Problem Relation Age of Onset  . Heart attack Father   . Stroke Mother     CVA  . Coronary artery disease Daughter   . Kidney failure Daughter   . Heart murmur Daughter   . Breast cancer Cousin   . Diabetes Cousin     Mother's side of family     Reviewed with patient  Prior to Admission medications   Medication Sig Start Date End Date Taking? Authorizing Provider  acetaminophen (TYLENOL) 500 MG tablet Take 500 mg by mouth as needed.   Yes Historical Provider, MD  albuterol (PROVENTIL) (2.5 MG/3ML) 0.083% nebulizer solution Take 2.5 mg by nebulization every 6 (six) hours as needed for wheezing.   Yes Historical Provider, MD  amLODipine (NORVASC) 10 MG tablet TAKE 1 TABLET BY MOUTH EVERY DAY **FOLLOW UP WITH MD FOR MORE REFILLS** 01/26/14  Yes Dolores Patty, MD  aspirin 81 MG chewable tablet Chew 1 tablet (81 mg total) by mouth daily. 02/19/12  Yes Simonne Martinet, NP  atorvastatin (LIPITOR) 40 MG  tablet Take 1 tablet (40 mg total) by mouth daily. ** Follow Up With Doctor For Refills ** 01/26/14  Yes Dolores Patty, MD  busPIRone (BUSPAR) 5 MG tablet TAKE 1 TABLET BY MOUTH TWICE DAILY 02/25/14  Yes Dolores Patty, MD  carvedilol (COREG) 6.25 MG tablet Take 1 tablet (6.25 mg total) by mouth 2 (two) times daily with a meal. ** Follow Up With Doctor For Refills ** 01/26/14  Yes Dolores Patty, MD  cloNIDine (CATAPRES) 0.2 MG tablet TAKE 1/2 TABLET BY MOUTH TWICE DAILY 10/31/13  Yes Dolores Patty, MD  hydrALAZINE (APRESOLINE) 100 MG tablet TAKE 11/2 TABLETS (  TOTAL) BY MOUTH THREE TIMES A DAY 01/28/14  Yes Aundria Rud, NP  isosorbide mononitrate (IMDUR) 30 MG 24  hr tablet Take 30 mg by mouth daily.  11/27/13  Yes Dolores Pattyaniel R Bensimhon, MD  levofloxacin (LEVAQUIN) 750 MG tablet Take 1 tablet (750 mg total) by mouth daily. 03/29/14  Yes Gilda Creasehristopher J. Pollina, MD  LORazepam (ATIVAN) 0.5 MG tablet Take 1 tablet (0.5 mg total) by mouth every 6 (six) hours as needed for anxiety. 09/14/12  Yes Christiane Haorinna L Sullivan, MD  metolazone (ZAROXOLYN) 5 MG tablet Take 5 mg by mouth once a week. Friday.   Yes Historical Provider, MD  Multiple Vitamin (MULTIVITAMIN WITH MINERALS) TABS tablet Take 1 tablet by mouth daily.   Yes Historical Provider, MD  pantoprazole (PROTONIX) 20 MG tablet TAKE 2 TABLETS BY MOUTH ONCE DAILY 12/19/13  Yes Dolores Pattyaniel R Bensimhon, MD  potassium chloride (K-DUR,KLOR-CON) 20 MEQ tablet Take 1 tablet (20 mEq total) by mouth 2 (two) times daily. 12/20/13  Yes Amy D Clegg, NP  torsemide (DEMADEX) 20 MG tablet Take 40 mg by mouth 2 (two) times daily. Take 60mg  by mouth in the morning and 40mg  by mouth in the afternoon 11/27/13  Yes Dolores Pattyaniel R Bensimhon, MD  vitamin C (ASCORBIC ACID) 500 MG tablet Take 500 mg by mouth daily.   Yes Historical Provider, MD    Physical Exam: BP 150/61 mmHg  Pulse 70  Temp(Src) 98.8 F (37.1 C) (Oral)  Resp 35  Ht 5\' 4"  (1.626 m)  Wt 81.194 kg (179 lb)   BMI 30.71 kg/m2  SpO2 100%  No overt deficits          Labs on Admission:  Basic Metabolic Panel:  Recent Labs Lab 03/29/14 1411 04/03/14 0759  NA 141 143  K 3.6 3.7  CL 106 109  CO2 28 24  GLUCOSE 104* 95  BUN 57* 44*  CREATININE 3.96* 4.01*  CALCIUM 9.5 9.5   Liver Function Tests:  Recent Labs Lab 04/03/14 0759  AST 14  ALT 7  ALKPHOS 93  BILITOT 0.7  PROT 6.5  ALBUMIN 3.4*   No results for input(s): LIPASE, AMYLASE in the last 168 hours. No results for input(s): AMMONIA in the last 168 hours. CBC:  Recent Labs Lab 03/29/14 1411 04/03/14 0759  WBC 7.9 8.2  NEUTROABS  --  5.7  HGB 10.2* 9.8*  HCT 32.5* 31.2*  MCV 82.9 82.5  PLT 197 189   Cardiac Enzymes:  Recent Labs Lab 04/03/14 0759  TROPONINI 0.05*    BNP (last 3 results)  Recent Labs  03/29/14 1411 04/03/14 0759  BNP 254.1* 190.4*     CBG: No results for input(s): GLUCAP in the last 168 hours.  Radiological Exams on Admission: Dg Chest Portable 1 View  04/03/2014   CLINICAL DATA:  Shortness of breath.  Tracheostomy  EXAM: PORTABLE CHEST - 1 VIEW  COMPARISON:  03/29/2014  FINDINGS: Progressive volume loss at the left base with diaphragm elevation and atelectasis. Increased markings at the right base is likely interstitial crowding. No edema. No pneumothorax.  Tracheostomy tube remains seated.  Chronic cardiomegaly. Aortic and hilar contours are unchanged when accounting for rotation.  IMPRESSION: 1. Progressive atelectasis and volume loss at the left base. 2. Cardiomegaly.   Electronically Signed   By: Marnee SpringJonathon  Watts M.D.   On: 04/03/2014 08:15    EKG: Independently reviewed. Sinus rhythm IVCD, consider atypical RBBB LVH with IVCD and secondary repol abnrm Prolonged QT interval No significant change since last tracing  Assessment/Plan Present on Admission:  . Acute tracheitis: No fever or white count. Have started every 4  hours nebulizers plus when necessary. Tylenol. Checking  pro calcitonin and if elevated, will add antibiotics. In the meantime provide suction plus trach care  . Cirrhosis: Chronic. Keep as active problem to avoid medications with too much liver clearance   . Chronic combined systolic and diastolic CHF (congestive heart failure): Patient currently looks dry. BNP normal. Watching weights. Jelly hydrating 1 day . OSA on CPAP . Hypertension: Continuing some of her antihypertensives . Obesity (BMI 30.0-34.9) . Acute and chronic respiratory failure with hypoxia: Supple and oxygen, baseline at 2 L .  acute on CKD (chronic kidney disease stage IV , currently  GFR less than 15 ml/min: Gently hydrate 24 hours   Consultants: None   CODE STATUS: Full code  Family Communication: left message with daughter   disposition: Home once oxygen requirement back to baseline at 2 L  Time spent45 minutes   Hollice Espy [facility] Pager [phone]

## 2014-04-03 NOTE — ED Notes (Signed)
Pt in from home c/o SOB onset yesterday, pt has trach mask with O2 @ 2L baseline, sats 80% on 2L, EMS placed pt on 6 L O2 sats 100%, per report pt has moderate amt of white thick secretions, hx of CHF, pt A&O x4, follows commands, speaks in complete sentences

## 2014-04-03 NOTE — ED Notes (Signed)
Heart healthy diet ordered. 

## 2014-04-03 NOTE — ED Notes (Signed)
Dinner tray ordered.

## 2014-04-03 NOTE — ED Notes (Signed)
Pt placed on inpatient bed removed from stretcher

## 2014-04-03 NOTE — ED Notes (Signed)
Pt undressed, in gown, on monitor, continuous pulse oximetryy and blood pressure cuff

## 2014-04-03 NOTE — ED Notes (Signed)
Attempted report 

## 2014-04-03 NOTE — ED Provider Notes (Signed)
CSN: [id number]     Arrival date & time 04/03/14  0746 History   First MD Initiated Contact with Patient 04/03/14 [phone]     Chief Complaint  Patient presents with  . Shortness of Breath     (Consider location/radiation/quality/duration/timing/severity/associated sxs/prior Treatment) HPI Comments: Patient presents to the ER for evaluation of shortness of breath. Patient reports that the shortness of breath began last night. She reports that she has been experiencing chills through the night. She has had significantly increased trach secretions. She reports cough as well. She denies chest pain.  Patient is brought to the emergency department by EMS. Patient's normally on 2 L oxygen by trach mask at home. EMS reports that her oxygen saturations were 80% when they arrived. She was increased to 6 L and suctioned. Sats are now 100%.  Patient is a 74 y.o. female presenting with shortness of breath.  Shortness of Breath Associated symptoms: cough     Past Medical History  Diagnosis Date  . COPD (chronic obstructive pulmonary disease)     on home o2  . CVA (cerebral infarction)     hx  . Hypertensive heart disease with congestive heart failure   . Chronic cor pulmonale   . Chronic kidney disease (CKD), stage IV (severe)   . Hyperlipdemia   . Obesity (BMI 30-39.9)   . OSA (obstructive sleep apnea)     Does not use CPAP  . History of gout 09/07/2011  . Chronic combined systolic and diastolic CHF (congestive heart failure) 7/13    a. With cor pulmonale - EF 45-50%.  . Anemia   . Vascular disease   . Asthma   . Hypertension   . Diabetes mellitus   . GERD (gastroesophageal reflux disease)   . Arthritis   . RBBB   . Thrombocytopenia   . Acute and chronic respiratory failure     a. Trache placed 11/2011 for recurrent intubation (hypercarbic resp failure), post-intubation vocal cord edema, several issues since that time wih trache. b. On home O2 with trache.  . Obesity, unspecified  08/28/2012  . Obesity hypoventilation syndrome   . Lung collapse     a. Chronic LLL collapse.  . Cardiac arrest     a. Asystolic cardiac arrest 02/2012 in setting of a/c respiratory failure (ischemic w/u has been deferred due to worsening CKD).  . NSTEMI (non-ST elevated myocardial infarction)     a. 06/2012 felt type II - troponin peak 1.45 - ischemic eval has been deferred due to CKD. Started on Plavix.  . Pulmonary HTN     a. mild pulm HTN by [facility] 08/2011, felt due to lung disease. b. PA pressure by echo 06/2012.  Marland Kitchen NSVT (nonsustained ventricular tachycardia)     a. Noted on tele, admission in 06/2012.  Marland Kitchen Cirrhosis     a. Suspected on abd CT 12/2011.   Past Surgical History  Procedure Laterality Date  . Bilateral oophorectomy    . Cardiac catheterization      right heart cath  . Laparoscopic gastrostomy  12/21/2011    Procedure: LAPAROSCOPIC GASTROSTOMY;  Surgeon: Axel Filler, MD;  Location: [facility] OR;  Service: General;  Laterality: N/A;  laparoscopic gastrostomy possible open feeding tube  . Abdominal hysterectomy    . Tracheostomy  11/2011  . Cataracts    . Tracheostomy tube placement  02/18/2012    Procedure: TRACHEOSTOMY;  Surgeon: Serena Colonel, MD;  Location: [facility] OR;  Service: ENT;  Laterality: N/A;  . Tracheostomy revision  N/A 09/01/2012    Procedure: TRACHEOSTOMY REVISION;  Surgeon: Serena Colonel, MD;  Location: [facility] OR;  Service: ENT;  Laterality: N/A;  . Right heart catheterization N/A 09/13/2011    Procedure: RIGHT HEART CATH;  Surgeon: Vesta Mixer, MD;  Location: [facility] CATH LAB;  Service: Cardiovascular;  Laterality: N/A;   Family History  Problem Relation Age of Onset  . Heart attack Father   . Stroke Mother     CVA  . Coronary artery disease Daughter   . Kidney failure Daughter   . Heart murmur Daughter   . Breast cancer Cousin   . Diabetes Cousin     Mother's side of family    History  Substance Use Topics  . Smoking status: Former Smoker -- 1.00 packs/day for  55 years    Types: Cigarettes    Quit date: 08/28/2011  . Smokeless tobacco: Never Used  . Alcohol Use: No   OB History    No data available     Review of Systems  Constitutional: Positive for chills.  Respiratory: Positive for cough and shortness of breath.   All other systems reviewed and are negative.     Allergies  Other and Sulfonamide derivatives  Home Medications   Prior to Admission medications   Medication Sig Start Date End Date Taking? Authorizing Provider  acetaminophen (TYLENOL) 500 MG tablet Take 500 mg by mouth as needed.   Yes Historical Provider, MD  albuterol (PROVENTIL) (2.5 MG/3ML) 0.083% nebulizer solution Take 2.5 mg by nebulization every 6 (six) hours as needed for wheezing.   Yes Historical Provider, MD  amLODipine (NORVASC) 10 MG tablet TAKE 1 TABLET BY MOUTH EVERY DAY **FOLLOW UP WITH MD FOR MORE REFILLS** 01/26/14  Yes Dolores Patty, MD  aspirin 81 MG chewable tablet Chew 1 tablet (81 mg total) by mouth daily. 02/19/12  Yes Simonne Martinet, NP  atorvastatin (LIPITOR) 40 MG tablet Take 1 tablet (40 mg total) by mouth daily. ** Follow Up With Doctor For Refills ** 01/26/14  Yes Dolores Patty, MD  busPIRone (BUSPAR) 5 MG tablet TAKE 1 TABLET BY MOUTH TWICE DAILY 02/25/14  Yes Dolores Patty, MD  carvedilol (COREG) 6.25 MG tablet Take 1 tablet (6.25 mg total) by mouth 2 (two) times daily with a meal. ** Follow Up With Doctor For Refills ** 01/26/14  Yes Dolores Patty, MD  cloNIDine (CATAPRES) 0.2 MG tablet TAKE 1/2 TABLET BY MOUTH TWICE DAILY 10/31/13  Yes Dolores Patty, MD  hydrALAZINE (APRESOLINE) 100 MG tablet TAKE 11/2 TABLETS (150MG  TOTAL) BY MOUTH THREE TIMES A DAY 01/28/14  Yes Aundria Rud, NP  isosorbide mononitrate (IMDUR) 30 MG 24 hr tablet Take 30 mg by mouth daily.  11/27/13  Yes Dolores Patty, MD  levofloxacin (LEVAQUIN) 750 MG tablet Take 1 tablet (750 mg total) by mouth daily. 03/29/14  Yes Gilda Crease, MD   LORazepam (ATIVAN) 0.5 MG tablet Take 1 tablet (0.5 mg total) by mouth every 6 (six) hours as needed for anxiety. 09/14/12  Yes Christiane Ha, MD  metolazone (ZAROXOLYN) 5 MG tablet Take 5 mg by mouth once a week. Friday.   Yes Historical Provider, MD  Multiple Vitamin (MULTIVITAMIN WITH MINERALS) TABS tablet Take 1 tablet by mouth daily.   Yes Historical Provider, MD  pantoprazole (PROTONIX) 20 MG tablet TAKE 2 TABLETS BY MOUTH ONCE DAILY 12/19/13  Yes Dolores Patty, MD  potassium chloride (K-DUR,KLOR-CON) 20 MEQ tablet Take 1 tablet (  20 mEq total) by mouth 2 (two) times daily. 12/20/13  Yes Amy D Clegg, NP  torsemide (DEMADEX) 20 MG tablet Take 40 mg by mouth 2 (two) times daily. Take  by mouth in the morning and  by mouth in the afternoon 11/27/13  Yes Dolores Patty, MD  vitamin C (ASCORBIC ACID) 500 MG tablet Take 500 mg by mouth daily.   Yes Historical Provider, MD  amLODipine (NORVASC) 10 MG tablet TAKE 1 TABLET BY MOUTH EVERY DAY **FOLLOW UP WITH MD FOR MORE REFILLS** Patient not taking: Reported on 04/03/2014 02/14/14   Dolores Patty, MD  atorvastatin (LIPITOR) 40 MG tablet TAKE 1 TABLET BY MOUTH ONCE DAILY Patient not taking: Reported on 04/03/2014 02/14/14   Dolores Patty, MD  carvedilol (COREG) 6.25 MG tablet TAKE 1 TABLET BY MOUTH TWICE DAILY WITH FOOD Patient not taking: Reported on 04/03/2014 02/14/14   Dolores Patty, MD  isosorbide mononitrate (IMDUR) 30 MG 24 hr tablet Take 1 tablet (30 mg total) by mouth daily. ** Follow Up With Doctor For Refills ** Patient not taking: Reported on 04/03/2014 01/26/14   Dolores Patty, MD  torsemide (DEMADEX) 20 MG tablet TAKE 2 TABLETS BY MOUTH TWICE DAILY Patient not taking: Reported on 04/03/2014 02/14/14   Bevelyn Buckles Bensimhon, MD   BP 154/89 mmHg  Pulse 68  Temp(Src) 98.8 F (37.1 C) (Oral)  Resp 29  Ht  (1.626 m)  Wt 179 lb (81.194 kg)  BMI 30.71 kg/m2  SpO2 100% Physical Exam  Constitutional:  She is oriented to person, place, and time. She appears well-developed and well-nourished. No distress.  HENT:  Head: Normocephalic and atraumatic.  Right Ear: Hearing normal.  Left Ear: Hearing normal.  Nose: Nose normal.  Mouth/Throat: Oropharynx is clear and moist and mucous membranes are normal.  Eyes: Conjunctivae and EOM are normal. Pupils are equal, round, and reactive to light.  Neck: Normal range of motion. Neck supple.  Cardiovascular: Regular rhythm, S1 normal and S2 normal.  Exam reveals no gallop and no friction rub.   No murmur heard. Pulmonary/Chest: Accessory muscle usage present. Tachypnea noted. No respiratory distress. She has decreased breath sounds. She has rhonchi. She has rales. She exhibits no tenderness.  Abdominal: Soft. Normal appearance and bowel sounds are normal. There is no hepatosplenomegaly. There is no tenderness. There is no rebound, no guarding, no tenderness at McBurney's point and negative Murphy's sign. No hernia.  Musculoskeletal: Normal range of motion.  Neurological: She is alert and oriented to person, place, and time. She has normal strength. No cranial nerve deficit or sensory deficit. Coordination normal. GCS eye subscore is 4. GCS verbal subscore is 5. GCS motor subscore is 6.  Skin: Skin is warm, dry and intact. No rash noted. No cyanosis.  Psychiatric: She has a normal mood and affect. Her speech is normal and behavior is normal. Thought content normal.  Nursing note and vitals reviewed.   ED Course  Procedures (including critical care time) Labs Review Labs Reviewed  CBC WITH DIFFERENTIAL/PLATELET - Abnormal; Notable for the following:    RBC 3.78 (*)    Hemoglobin 9.8 (*)    HCT 31.2 (*)    MCH 25.9 (*)    RDW 16.9 (*)    All other components within normal limits  COMPREHENSIVE METABOLIC PANEL - Abnormal; Notable for the following:    BUN 44 (*)    Creatinine, Ser 4.01 (*)    Albumin 3.4 (*)  GFR calc non Af Amer 10 (*)    GFR  calc Af Amer 12 (*)    All other components within normal limits  TROPONIN I - Abnormal; Notable for the following:    Troponin I 0.05 (*)    All other components within normal limits  BRAIN NATRIURETIC PEPTIDE - Abnormal; Notable for the following:    B Natriuretic Peptide 190.4 (*)    All other components within normal limits  CULTURE, BLOOD (ROUTINE X 2)  CULTURE, BLOOD (ROUTINE X 2)  CULTURE, RESPIRATORY (NON-EXPECTORATED)  I-STAT CG4 LACTIC ACID, ED    Imaging Review Dg Chest Portable 1 View  04/03/2014   CLINICAL DATA:  Shortness of breath.  Tracheostomy  EXAM: PORTABLE CHEST - 1 VIEW  COMPARISON:  03/29/2014  FINDINGS: Progressive volume loss at the left base with diaphragm elevation and atelectasis. Increased markings at the right base is likely interstitial crowding. No edema. No pneumothorax.  Tracheostomy tube remains seated.  Chronic cardiomegaly. Aortic and hilar contours are unchanged when accounting for rotation.  IMPRESSION: 1. Progressive atelectasis and volume loss at the left base. 2. Cardiomegaly.   Electronically Signed   By: Marnee SpringJonathon  Watts M.D.   On: 04/03/2014 08:15     EKG Interpretation   Date/Time:  Wednesday April 03 2014 07:54:38 EST Ventricular Rate:  78 PR Interval:  203 QRS Duration: 159 QT Interval:  448 QTC Calculation: 510 R Axis:   -40 Text Interpretation:  Sinus rhythm IVCD, consider atypical RBBB LVH with  IVCD and secondary repol abnrm Prolonged QT interval No significant change  since last tracing Confirmed by POLLINA  MD, CHRISTOPHER [phone]([id number]) on  04/03/2014 8:01:52 AM      MDM   Final diagnoses:  None   tracheitis  Patient presents to the ER for evaluation of shortness of breath. Patient has previous tracheostomy. She is also very noncompliant. She reports that since yesterday she has had progressive worsening of her breathing with thick trach secretions and cough. She has had chills, but has not had a fever here in the ER. Patient  was significantly hypoxic upon arrival to the ER on her normal oxygen. With aggressive suctioning and increased O2, patient has improved. Her workup is largely unremarkable - no significant signs of decompensated congestive heart failure. No pneumonia on x-ray. Suspect significant tracheitis. Patient will require hospitalization for further management.  Gilda Creasehristopher J. Pollina, MD 04/03/14 [phone]

## 2014-04-04 DIAGNOSIS — J441 Chronic obstructive pulmonary disease with (acute) exacerbation: Secondary | ICD-10-CM | POA: Diagnosis present

## 2014-04-04 DIAGNOSIS — G4733 Obstructive sleep apnea (adult) (pediatric): Secondary | ICD-10-CM

## 2014-04-04 DIAGNOSIS — I10 Essential (primary) hypertension: Secondary | ICD-10-CM

## 2014-04-04 DIAGNOSIS — I4510 Unspecified right bundle-branch block: Secondary | ICD-10-CM | POA: Diagnosis present

## 2014-04-04 DIAGNOSIS — J9811 Atelectasis: Secondary | ICD-10-CM | POA: Diagnosis present

## 2014-04-04 DIAGNOSIS — J189 Pneumonia, unspecified organism: Secondary | ICD-10-CM | POA: Diagnosis present

## 2014-04-04 DIAGNOSIS — I5043 Acute on chronic combined systolic (congestive) and diastolic (congestive) heart failure: Secondary | ICD-10-CM | POA: Diagnosis present

## 2014-04-04 DIAGNOSIS — N189 Chronic kidney disease, unspecified: Secondary | ICD-10-CM

## 2014-04-04 DIAGNOSIS — N179 Acute kidney failure, unspecified: Secondary | ICD-10-CM

## 2014-04-04 LAB — BASIC METABOLIC PANEL
Anion gap: 6 (ref 5–15)
BUN: 42 mg/dL — AB (ref 6–23)
CHLORIDE: 111 mmol/L (ref 96–112)
CO2: 24 mmol/L (ref 19–32)
Calcium: 8.8 mg/dL (ref 8.4–10.5)
Creatinine, Ser: 3.96 mg/dL — ABNORMAL HIGH (ref 0.50–1.10)
GFR calc Af Amer: 12 mL/min — ABNORMAL LOW (ref >90)
GFR, EST NON AFRICAN AMERICAN: 10 mL/min — AB (ref >90)
Glucose, Bld: 106 mg/dL — ABNORMAL HIGH (ref 70–99)
POTASSIUM: 3.8 mmol/L (ref 3.5–5.1)
SODIUM: 141 mmol/L (ref 135–145)

## 2014-04-04 LAB — CBC
HEMATOCRIT: 29.2 % — AB (ref 36.0–46.0)
Hemoglobin: 9.1 g/dL — ABNORMAL LOW (ref 12.0–15.0)
MCH: 26.4 pg (ref 26.0–34.0)
MCHC: 31.2 g/dL (ref 30.0–36.0)
MCV: 84.6 fL (ref 78.0–100.0)
Platelets: 148 10*3/uL — ABNORMAL LOW (ref 150–400)
RBC: 3.45 MIL/uL — ABNORMAL LOW (ref 3.87–5.11)
RDW: 17 % — ABNORMAL HIGH (ref 11.5–15.5)
WBC: 7.1 10*3/uL (ref 4.0–10.5)

## 2014-04-04 LAB — LIPID PANEL
Cholesterol: 162 mg/dL (ref 0–200)
HDL: 29 mg/dL — ABNORMAL LOW (ref >39)
LDL Cholesterol: 101 mg/dL — ABNORMAL HIGH (ref 0–99)
Total CHOL/HDL Ratio: 5.6 RATIO
Triglycerides: 160 mg/dL — ABNORMAL HIGH (ref <150)
VLDL: 32 mg/dL (ref 0–40)

## 2014-04-04 LAB — GLUCOSE, CAPILLARY: Glucose-Capillary: 95 mg/dL (ref 70–99)

## 2014-04-04 MED ORDER — IPRATROPIUM-ALBUTEROL 0.5-2.5 (3) MG/3ML IN SOLN
3.0000 mL | Freq: Four times a day (QID) | RESPIRATORY_TRACT | Status: DC
  Administered 2014-04-04 – 2014-04-11 (×29): 3 mL via RESPIRATORY_TRACT
  Filled 2014-04-04 (×28): qty 3

## 2014-04-04 MED ORDER — DM-GUAIFENESIN ER 30-600 MG PO TB12
1.0000 | ORAL_TABLET | Freq: Two times a day (BID) | ORAL | Status: DC
  Administered 2014-04-04 – 2014-04-11 (×15): 1 via ORAL
  Filled 2014-04-04 (×16): qty 1

## 2014-04-04 NOTE — Trach Care Team (Signed)
Trach Care Progression Note   Patient Details Name: [name] MRN: [id number] DOB: 04/07/1940 Today's Date: 04/04/2014   Tracheostomy Assessment    Tracheostomy 4 mm Uncuffed (Active)  Status Secured 04/04/2014 12:45 PM  Site Assessment Clean;Dry 04/04/2014 12:45 PM  Site Care Cleansed;Dried;Dressing applied 04/04/2014  9:00 AM  Inner Cannula Care Cleansed/dried 04/04/2014  9:00 AM  Ties Assessment Secure 04/04/2014 12:45 PM  Cuff pressure (cm) 0 cm 04/04/2014 12:45 PM  Emergency Equipment at bedside Yes 04/04/2014 12:45 PM     Care Needs     Respiratory Therapy Chronic Trach O2 Device: Tracheostomy Collar SpO2: 98 %    Speech Language Pathology  SLP chart review complete: Patient does not need SLP services at this time (has been evaluated multiple times, independent. )   Physical Therapy      Occupational Therapy      Nutritional Patient's Current Diet: Cardiac    Case Management/Social Work      Theatre managerrovider Trach Care Team/Provider                  None  Recommendations Trach Care Team Members Present-  [place]enna [place], SW, & Harlon DittyBonnie DeBlois, SLP     at present. Chronic trach from home.           Melford Tullier, Silva BandyDebra Anita (scribe for team) 04/04/2014, 1:47 PM

## 2014-04-04 NOTE — Progress Notes (Signed)
[place] TEAM 1 - Stepdown/ICU TEAM Progress Note  Felicia FormMildred W Cambria NGE:[id number] DOB: Feb 04, 1941 DOA: 04/03/2014 PCP: Oneal GroutPANDEY, MAHIMA, MD  Admit HPI / Brief Narrative: [name] is a 74 y.o.BF PMHx  Chronic respiratory failure (on 2 L O2 via [place]), chronic trach, COPD, OSA/OHS, chronic LLL collapse systolic/diastolic heart failure, RBBB,NSTEMI, pulmonary hypertension stage IV chronic kidney disease, liver cirrhosis?, Noncompliance in terms of treatment management. 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 Emergency and was able to replace it at that time and patient was discharged on by mouth Levaquin.  She returned the emergency room on 2/10 morning after she woke up last night feeling that she could not catch her breath. She has baseline of 2 L oxygen but was found to have oxygen saturations in the 80s and required up to 5 L by trach mask. She was also given albuterol. Lab work was unrevealing. Chest x-ray unrevealing. Patient was noted to have large amount of secretions coming from her trach, which she states been going on for the past 1-2 days. Hospitalists were called for further evaluation and admission.   HPI/Subjective: 2/11 A/O 4, states positive productive cough (whitish), positive chills/shakes (did not measure her temp), increasing SOB, negative CP. States her dry weight is 170 pounds  Assessment/Plan: Acute on chronic respiratory failure with hypoxia; CAP -NTS QID -Obtain sputum culture -Obtain respiratory virus panel -Start on ceftriaxone + azithromycin -Sodium Medrol 60 mg daily -Mucinex DM BID -DuoNeb QID   Acute tracheitis -See acute on chronic respiratory failure  COPD exacerbation -See acute on chronic respiratory failure  OSA/OHS on  CPAP -CPAP per respiratory  Chronic LLL collapse   Systolic and diastolic CHF/RBBB -Strict in and out since admission +648.2 -Daily a.m. admission weight= 83.8 kg (184 pounds);   -Appears to be slightly over her baseline weight, will hold on diuretics until we obtain new weight.    HTN -Amlodipine 10 mg daily -Coreg 6.25 mg BID -Imdur 30 mg daily -Continue Catapres 0.1 mg BID to avoid rebound hypertension -Obtain lipid panel  Acute on chronic kidney Failure (baseline Cr= 3.11-3.80) -Patient slightly above her baseline continue gentle hydration  -Hold diuretics     Code Status: FULL Family Communication: no family present at time of exam Disposition Plan: Resolution CAP  Consultants: None  Procedure/Significant Events: 03/21/13 echocardiogram;- Left ventricle: severe LVH. -LVEF= 55% to 60%.  -(grade 1 diastolic dysfunction). - Left atrium: moderately dilated. - Right ventricle:  mildly dilated. - Right atrium: mildly dilated. - Pulmonary arteries: PA peak pressure: 34mm Hg (S).   Culture 2/10 blood pending 2/11 sputum pending 2/11 respiratory virus panel pending  Antibiotics: Ceftriaxone 2/11>> Azithromycin 2/11>>   DVT prophylaxis: Lovenox   Devices Chronic trach   LINES / TUBES:     Continuous Infusions: . sodium chloride 50 mL/hr (04/03/14 1719)    Objective: VITAL SIGNS: Temp: 98.4 F (36.9 C) (02/11 0311) Temp Source: Oral (02/11 0311) BP: 161/67 mmHg (02/11 0415) Pulse Rate: 76 (02/11 0415) SPO2; 96%, 5 L O2 FIO2:   Intake/Output Summary (Last 24 hours) at 04/04/14 0813 Last data filed at 04/04/14 0300  Gross per 24 hour  Intake 484.17 ml  Output      0 ml  Net 484.17 ml  Exam: General: A/O 4, NAD, No acute respiratory distress, thick yellow sputum expressed from her tracheostomy Lungs: tachypneic, mild diffuse rhonchi, decreased breath sounds LLL (chronic), mild wheezes, no crackles Cardiovascular: Regular rate and  rhythm without murmur gallop or rub normal S1 and S2 Abdomen: Nontender, nondistended, soft, bowel sounds positive, no rebound, no ascites, no appreciable mass Extremities: No significant cyanosis, clubbing, or edema bilateral lower extremities  Data Reviewed: Basic Metabolic Panel:  Recent Labs Lab 03/29/14 1411 04/03/14 0759 04/04/14 0513  NA 141 143 141  K 3.6 3.7 3.8  CL 106 109 111  CO2 GLUCOSE 104* 95 106*  BUN 57* 44* 42*  CREATININE 3.96* 4.01* 3.96*  CALCIUM 9.5 9.5 8.8   Liver Function Tests:  Recent Labs Lab 04/03/14 0759  AST 14  ALT 7  ALKPHOS 93  BILITOT 0.7  PROT 6.5  ALBUMIN 3.4*   No results for input(s): LIPASE, AMYLASE in the last 168 hours. No results for input(s): AMMONIA in the last 168 hours. CBC:  Recent Labs Lab 03/29/14 1411 04/03/14 0759 04/04/14 0513  WBC 7.9 8.2 7.1  NEUTROABS  --  5.7  --   HGB 10.2* 9.8* 9.1*  HCT 32.5* 31.2* 29.2*  MCV 82.9 82.5 84.6  PLT 197 189 148*   Cardiac Enzymes:  Recent Labs Lab 04/03/14 0759  TROPONINI 0.05*   BNP (last 3 results)  Recent Labs  03/29/14 1411 04/03/14 0759  BNP 254.1* 190.4*    ProBNP (last 3 results)  Recent Labs  05/02/13 1219 07/11/13 1208  PROBNP 1474.0* 1111.0*    CBG:  Recent Labs Lab 04/04/14 0313  GLUCAP 95    Recent Results (from the past 240 hour(s))  MRSA PCR Screening     Status: None   Collection Time: 04/03/14  8:42 PM  Result Value Ref Range Status   MRSA by PCR NEGATIVE NEGATIVE Final    Comment:        The GeneXpert MRSA Assay (FDA approved for NASAL specimens only), is one component of a comprehensive MRSA colonization surveillance program. It is not intended to diagnose MRSA infection nor to guide or monitor treatment for MRSA infections.      Studies:  Recent x-ray studies have been reviewed in detail by the Attending Physician  Scheduled Meds:  Scheduled Meds: . albuterol  2.5 mg Nebulization Q4H  .  amLODipine  10 mg Oral Daily  . aspirin  81 mg Oral Daily  . atorvastatin  40 mg Oral Daily  . busPIRone  5 mg Oral BID  . carvedilol  6.25 mg Oral BID WC  . enoxaparin (LOVENOX) injection  30 mg Subcutaneous Q24H  . isosorbide mononitrate  30 mg Oral Daily  . [START ON 04/05/2014] metolazone  5 mg Oral Weekly  . multivitamin with minerals  1 tablet Oral Daily  . pantoprazole  40 mg Oral Daily  . sodium chloride  3 mL Intravenous Q12H    Time spent on care of this patient: 40 mins   Drema [facility]  [facility] Office  [phone] Pager - [phone]  On-Call/Text Page:      [email or web address]      password [email or web address]  If 7PM-7AM, please contact night-coverage [email or web address] Password [facility] 04/04/2014, 8:13 AM   LOS: 1 day   Care during the described time interval was provided by me .  I have reviewed this patient's available data, including medical history, events of note, physical examination, radiology studies and test  results as part of my evaluation  Carolyne Littles, MD [phone] Pager

## 2014-04-05 ENCOUNTER — Inpatient Hospital Stay (HOSPITAL_COMMUNITY): Payer: Medicare Other

## 2014-04-05 DIAGNOSIS — Z930 Tracheostomy status: Secondary | ICD-10-CM | POA: Diagnosis present

## 2014-04-05 LAB — COMPREHENSIVE METABOLIC PANEL
ALBUMIN: 2.8 g/dL — AB (ref 3.5–5.2)
ALT: 7 U/L (ref 0–35)
AST: 11 U/L (ref 0–37)
Alkaline Phosphatase: 81 U/L (ref 39–117)
Anion gap: 7 (ref 5–15)
BUN: 38 mg/dL — ABNORMAL HIGH (ref 6–23)
CO2: 22 mmol/L (ref 19–32)
CREATININE: 3.47 mg/dL — AB (ref 0.50–1.10)
Calcium: 8.7 mg/dL (ref 8.4–10.5)
Chloride: 111 mmol/L (ref 96–112)
GFR calc Af Amer: 14 mL/min — ABNORMAL LOW (ref >90)
GFR calc non Af Amer: 12 mL/min — ABNORMAL LOW (ref >90)
Glucose, Bld: 99 mg/dL (ref 70–99)
Potassium: 4.2 mmol/L (ref 3.5–5.1)
SODIUM: 140 mmol/L (ref 135–145)
TOTAL PROTEIN: 5.9 g/dL — AB (ref 6.0–8.3)
Total Bilirubin: 0.5 mg/dL (ref 0.3–1.2)

## 2014-04-05 LAB — CBC WITH DIFFERENTIAL/PLATELET
BASOS ABS: 0 10*3/uL (ref 0.0–0.1)
Basophils Relative: 0 % (ref 0–1)
EOS PCT: 2 % (ref 0–5)
Eosinophils Absolute: 0.1 10*3/uL (ref 0.0–0.7)
HCT: 30.8 % — ABNORMAL LOW (ref 36.0–46.0)
Hemoglobin: 9.2 g/dL — ABNORMAL LOW (ref 12.0–15.0)
LYMPHS ABS: 1.2 10*3/uL (ref 0.7–4.0)
Lymphocytes Relative: 17 % (ref 12–46)
MCH: 25.5 pg — ABNORMAL LOW (ref 26.0–34.0)
MCHC: 29.9 g/dL — ABNORMAL LOW (ref 30.0–36.0)
MCV: 85.3 fL (ref 78.0–100.0)
Monocytes Absolute: 0.5 10*3/uL (ref 0.1–1.0)
Monocytes Relative: 6 % (ref 3–12)
Neutro Abs: 5.7 10*3/uL (ref 1.7–7.7)
Neutrophils Relative %: 75 % (ref 43–77)
PLATELETS: 168 10*3/uL (ref 150–400)
RBC: 3.61 MIL/uL — ABNORMAL LOW (ref 3.87–5.11)
RDW: 16.6 % — AB (ref 11.5–15.5)
WBC: 7.5 10*3/uL (ref 4.0–10.5)

## 2014-04-05 LAB — MAGNESIUM: Magnesium: 1.7 mg/dL (ref 1.5–2.5)

## 2014-04-05 MED ORDER — CETYLPYRIDINIUM CHLORIDE 0.05 % MT LIQD
7.0000 mL | Freq: Two times a day (BID) | OROMUCOSAL | Status: DC
  Administered 2014-04-05 – 2014-04-11 (×12): 7 mL via OROMUCOSAL

## 2014-04-05 MED ORDER — ALBUTEROL SULFATE (2.5 MG/3ML) 0.083% IN NEBU
INHALATION_SOLUTION | RESPIRATORY_TRACT | Status: AC
  Filled 2014-04-05: qty 3

## 2014-04-05 MED ORDER — CLONIDINE HCL 0.1 MG PO TABS
0.1000 mg | ORAL_TABLET | Freq: Two times a day (BID) | ORAL | Status: DC
  Administered 2014-04-05 – 2014-04-11 (×13): 0.1 mg via ORAL
  Filled 2014-04-05 (×14): qty 1

## 2014-04-05 MED ORDER — CEFTRIAXONE SODIUM IN DEXTROSE 20 MG/ML IV SOLN
1.0000 g | INTRAVENOUS | Status: DC
  Administered 2014-04-05 – 2014-04-09 (×5): 1 g via INTRAVENOUS
  Filled 2014-04-05 (×5): qty 50

## 2014-04-05 MED ORDER — ATORVASTATIN CALCIUM 40 MG PO TABS
60.0000 mg | ORAL_TABLET | Freq: Every day | ORAL | Status: DC
  Administered 2014-04-05 – 2014-04-10 (×6): 60 mg via ORAL
  Filled 2014-04-05 (×7): qty 1

## 2014-04-05 MED ORDER — AZITHROMYCIN 500 MG PO TABS
500.0000 mg | ORAL_TABLET | Freq: Every day | ORAL | Status: AC
  Administered 2014-04-05 – 2014-04-09 (×5): 500 mg via ORAL
  Filled 2014-04-05 (×5): qty 1

## 2014-04-05 MED ORDER — ALBUTEROL SULFATE (2.5 MG/3ML) 0.083% IN NEBU
2.5000 mg | INHALATION_SOLUTION | RESPIRATORY_TRACT | Status: DC | PRN
  Administered 2014-04-05 – 2014-04-10 (×4): 2.5 mg via RESPIRATORY_TRACT
  Filled 2014-04-05 (×3): qty 3

## 2014-04-05 NOTE — Progress Notes (Signed)
Pt continues to remove her inner cannula from her trach, she feels as if she cant breathe as well with it, and states she doesn't wear it all the time at home. Inner cannula has been cleaned multiple times, and has never been clogged when checked, bilateral breath sounds have been equal and bilateral, and sats remain above 92%. Expressed importance of not removing the inner cannula, cleaned it, and replaced it. Will continue to monitor.

## 2014-04-05 NOTE — Progress Notes (Signed)
Utilization Review Completed.  

## 2014-04-05 NOTE — Progress Notes (Signed)
[place] TEAM 1 - Stepdown/ICU TEAM Progress Note  [name] AVW:[id number] DOB: 1940-05-18 DOA: 04/03/2014 PCP: Felicia Grout, MD  Admit HPI / Brief Narrative: [name] is a 74 y.o.BF PMHx  Chronic respiratory failure (on 2 L O2 via [place]), chronic trach, COPD, OSA/OHS, chronic LLL collapse systolic/diastolic heart failure, RBBB,NSTEMI, pulmonary hypertension stage IV chronic kidney disease, liver cirrhosis?, Noncompliance in terms of treatment management. 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 Emergency and was able to replace it at that time and patient was discharged on by mouth Levaquin.  She returned the emergency room on 2/10 morning after she woke up last night feeling that she could not catch her breath. She has baseline of 2 L oxygen but was found to have oxygen saturations in the 80s and required up to 5 L by trach mask. She was also given albuterol. Lab work was unrevealing. Chest x-ray unrevealing. Patient was noted to have large amount of secretions coming from her trach, which she states been going on for the past 1-2 days. Hospitalists were called for further evaluation and admission.   HPI/Subjective: 2/12 A/O 4, states positive productive cough (whitish), positive chills/shakes (did not measure her temp); states SOB improving, negative CP. States her dry weight is 170 pounds  Assessment/Plan: Acute on chronic respiratory failure with hypoxia; CAP -NTS QID -Obtain sputum culture -Obtain respiratory virus panel -Start on ceftriaxone + azithromycin; although negative leukocytosis considering patient's underlying respiratory disease would complete 7 day course of antibiotic -Sodium Medrol 60 mg daily -Mucinex DM BID -DuoNeb QID   Acute  tracheitis -See acute on chronic respiratory failure  COPD exacerbation -See acute on chronic respiratory failure  OSA/OHS on CPAP -CPAP per respiratory  Chronic LLL collapse   Systolic and diastolic CHF/RBBB -Strict in and out since admission + 2 L  -Daily a.m. admission weight= 83.8 kg (184 pounds);  2/12 weight= 81.8 kg (180.3 pounds) -Patient is ~10 bounds above her stated dry weight , patient's acute kidney failure improving with gentle hydration. Not sure patient knows her accurate weight, will continue to hold diuretics  HTN -Amlodipine 10 mg daily -Coreg 6.25 mg BID -Imdur 30 mg daily -Continue Catapres 0.1 mg BID to avoid rebound hypertension -Obtain lipid panel  Acute on chronic kidney Failure (baseline Cr= 3.11-3.80) -Patient slightly above her baseline continue gentle hydration  -Hold diuretics  HLD -Not within NCEP guidelines -Increase Lipitor to 60 mg daily    Code Status: FULL Family Communication: no family present at time of exam Disposition Plan: Resolution CAP  Consultants: None  Procedure/Significant Events: 03/21/13 echocardiogram;- Left ventricle: severe LVH. -LVEF= 55% to 60%.  -(grade 1 diastolic dysfunction). - Left atrium: moderately dilated. - Right ventricle:  mildly dilated. - Right atrium: mildly dilated. - Pulmonary arteries: PA peak pressure: 34mm Hg (S).   Culture 2/10 blood right antecubital/wrist NGTD 2/10 MRSA by PCR negative 2/11 sputum reincubated 2/11 respiratory virus panel pending   Antibiotics: Ceftriaxone 2/12>> Azithromycin 2/12>>   DVT prophylaxis: Lovenox   Devices Chronic trach   LINES / TUBES:     Continuous Infusions: . sodium chloride 50 mL/hr (04/05/14 0825)    Objective: VITAL SIGNS: Temp: 98 F (36.7 C) (02/12 0755) Temp Source: Oral (02/12 0755) BP: 167/73 mmHg (  02/12 0937) Pulse Rate: 66 (02/12 1142) SPO2; 96%, 5 L O2 FIO2:   Intake/Output Summary (Last 24 hours) at 04/05/14  1227 Last data filed at 04/05/14 1000  Gross per 24 hour  Intake   1400 ml  Output    445 ml  Net    955 ml     Exam: General: A/O 4, NAD, No acute respiratory distress, thick yellow sputum expressed from her tracheostomy (decreased from 2/11) Lungs: tachypneic, mild diffuse coarse rhonchi, decreased breath sounds LLL (chronic), mild wheezes, no crackles Cardiovascular: Regular rate and rhythm without murmur gallop or rub normal S1 and S2 Abdomen: Nontender, nondistended, soft, bowel sounds positive, no rebound, no ascites, no appreciable mass Extremities: No significant cyanosis, clubbing, or edema bilateral lower extremities  Data Reviewed: Basic Metabolic Panel:  Recent Labs Lab 03/29/14 1411 04/03/14 0759 04/04/14 0513 04/05/14 0904  NA 141 143 141 140  K 3.6 3.7 3.8 4.2  CL 106 109 111 111  CO2 28 24 24 22   GLUCOSE 104* 95 106* 99  BUN 57* 44* 42* 38*  CREATININE 3.96* 4.01* 3.96* 3.47*  CALCIUM 9.5 9.5 8.8 8.7  MG  --   --   --  1.7   Liver Function Tests:  Recent Labs Lab 04/03/14 0759 04/05/14 0904  AST 14 11  ALT 7 7  ALKPHOS 93 81  BILITOT 0.7 0.5  PROT 6.5 5.9*  ALBUMIN 3.4* 2.8*   No results for input(s): LIPASE, AMYLASE in the last 168 hours. No results for input(s): AMMONIA in the last 168 hours. CBC:  Recent Labs Lab 03/29/14 1411 04/03/14 0759 04/04/14 0513 04/05/14 0904  WBC 7.9 8.2 7.1 7.5  NEUTROABS  --  5.7  --  5.7  HGB 10.2* 9.8* 9.1* 9.2*  HCT 32.5* 31.2* 29.2* 30.8*  MCV 82.9 82.5 84.6 85.3  PLT 197 189 148* 168   Cardiac Enzymes:  Recent Labs Lab 04/03/14 0759  TROPONINI 0.05*   BNP (last 3 results)  Recent Labs  03/29/14 1411 04/03/14 0759  BNP 254.1* 190.4*    ProBNP (last 3 results)  Recent Labs  05/02/13 1219 07/11/13 1208  PROBNP 1474.0* 1111.0*    CBG:  Recent Labs Lab 04/04/14 0313  GLUCAP 95    Recent Results (from the past 240 hour(s))  Culture, respiratory (NON-Expectorated)      Status: None (Preliminary result)   Collection Time: 04/03/14  8:10 AM  Result Value Ref Range Status   Specimen Description TRACHEAL ASPIRATE  Final   Special Requests NONE  Final   Gram Stain   Final    FEW WBC PRESENT, PREDOMINANTLY PMN FEW SQUAMOUS EPITHELIAL CELLS PRESENT RARE GRAM NEGATIVE RODS RARE GRAM POSITIVE COCCI IN PAIRS IN CHAINS    Culture   Final    Culture reincubated for better growth [facility]formed at [facility]    Report Status PENDING  Incomplete  Culture, blood (routine x 2)     Status: None (Preliminary result)   Collection Time: 04/03/14  8:18 AM  Result Value Ref Range Status   Specimen Description BLOOD RIGHT ANTECUBITAL  Final   Special Requests BOTTLES DRAWN AEROBIC AND ANAEROBIC 5CCS  Final   Culture   Final           BLOOD CULTURE RECEIVED NO GROWTH TO DATE CULTURE WILL BE HELD FOR 5 DAYS BEFORE ISSUING A FINAL NEGATIVE REPORT Performed at [facility]    Report Status PENDING  Incomplete  Culture, blood (routine x  2)     Status: None (Preliminary result)   Collection Time: 04/03/14  8:23 AM  Result Value Ref Range Status   Specimen Description BLOOD RIGHT WRIST  Final   Special Requests   Final    BOTTLES DRAWN AEROBIC AND ANAEROBIC BLUE 5CC,RED 4CCS   Culture   Final           BLOOD CULTURE RECEIVED NO GROWTH TO DATE CULTURE WILL BE HELD FOR 5 DAYS BEFORE ISSUING A FINAL NEGATIVE REPORT Performed at [facility]    Report Status PENDING  Incomplete  MRSA PCR Screening     Status: None   Collection Time: 04/03/14  8:42 PM  Result Value Ref Range Status   MRSA by PCR NEGATIVE NEGATIVE Final    Comment:        The GeneXpert MRSA Assay (FDA approved for NASAL specimens only), is one component of a comprehensive MRSA colonization surveillance program. It is not intended to diagnose MRSA infection nor to guide or monitor treatment for MRSA infections.      Studies:  Recent x-ray studies have been reviewed in  detail by the Attending Physician  Scheduled Meds:  Scheduled Meds: . amLODipine  10 mg Oral Daily  . aspirin  81 mg Oral Daily  . atorvastatin  60 mg Oral q1800  . azithromycin  500 mg Oral Daily  . busPIRone  5 mg Oral BID  . carvedilol  6.25 mg Oral BID WC  . cefTRIAXone (ROCEPHIN)  IV  1 g Intravenous Q24H  . cloNIDine  0.1 mg Oral BID  . dextromethorphan-guaiFENesin  1 tablet Oral BID  . enoxaparin (LOVENOX) injection  30 mg Subcutaneous Q24H  . ipratropium-albuterol  3 mL Nebulization QID  . isosorbide mononitrate  30 mg Oral Daily  . metolazone  5 mg Oral Weekly  . multivitamin with minerals  1 tablet Oral Daily  . pantoprazole  40 mg Oral Daily  . sodium chloride  3 mL Intravenous Q12H    Time spent on care of this patient: 40 mins   Drema [facility]  [facility] Office  [phone] Pager - [phone]  On-Call/Text Page:      [email or web address]      password [email or web address]  If 7PM-7AM, please contact night-coverage [email or web address] Password [facility] 04/05/2014, 12:27 PM   LOS: 2 days   Care during the described time interval was provided by me .  I have reviewed this patient's available data, including medical history, events of note, physical examination, radiology studies and test results as part of my evaluation  Carolyne Littles, MD [phone] Pager

## 2014-04-06 DIAGNOSIS — N189 Chronic kidney disease, unspecified: Secondary | ICD-10-CM

## 2014-04-06 DIAGNOSIS — N179 Acute kidney failure, unspecified: Secondary | ICD-10-CM | POA: Diagnosis present

## 2014-04-06 NOTE — Progress Notes (Signed)
RT note-attempting to wean fio2, now at 30%

## 2014-04-06 NOTE — Progress Notes (Signed)
[facility] TEAM 1 - Stepdown/ICU TEAM Progress Note  [name] ZOX:[id number] DOB: December 13, 1940 DOA: 04/03/2014 PCP: Oneal Grout, MD  Admit HPI / Brief Narrative: [name] is a 74 y.o.BF PMHx  Chronic respiratory failure (on 2 L O2 via [place]), chronic trach, COPD, OSA/OHS, chronic LLL collapse systolic/diastolic heart failure, RBBB,NSTEMI, pulmonary hypertension stage IV chronic kidney disease, liver cirrhosis?, Noncompliance in terms of treatment management. 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 Emergency and was able to replace it at that time and patient was discharged on by mouth Levaquin.  She returned the emergency room on 2/10 morning after she woke up last night feeling that she could not catch her breath. She has baseline of 2 L oxygen but was found to have oxygen saturations in the 80s and required up to 5 L by trach mask. She was also given albuterol. Lab work was unrevealing. Chest x-ray unrevealing. Patient was noted to have large amount of secretions coming from her trach, which she states been going on for the past 1-2 days. Hospitalists were called for further evaluation and admission.   HPI/Subjective: 2/13 A/O 4, states continued positive productive cough (yellow/whitish) but decreasing, states SOB improving, but however has not been ambulated yet., negative CP. States that at home only ambulate from bed to bathroom and at other times is wheelchair-bound. States her dry weight is 170 pounds  Assessment/Plan: Acute on chronic respiratory failure with hypoxia; CAP -NTS QID -Obtain sputum culture -Obtain respiratory virus panel (did not get collected) -Continue on ceftriaxone + azithromycin; although negative leukocytosis considering  patient's underlying respiratory disease would complete 7 day course of antibiotic -Sodium Medrol 60 mg daily -Mucinex DM BID -DuoNeb QID   Acute tracheitis -See acute on chronic respiratory failure  COPD exacerbation -See acute on chronic respiratory failure  OSA/OHS on CPAP -CPAP per respiratory  Chronic LLL collapse -Stable  Systolic and diastolic CHF/RBBB -Strict in and out since admission + 3.5 L  -Daily a.m. admission weight= 83.8 kg (184 pounds);  2/13 weight= 83.8 kg (180.3 pounds) -Patient is ~10 bounds above her stated dry weight , patient's acute kidney failure improving with gentle hydration. Not sure patient knows her accurate weight, will continue to hold diuretics  HTN -Amlodipine 10 mg daily -Coreg 6.25 mg BID -Imdur 30 mg daily -Continue Catapres 0.1 mg BID to avoid rebound hypertension -Obtain lipid panel  Acute on chronic kidney Failure (baseline Cr= 3.11-3.80) -Patient slightly above her baseline continue gentle hydration  -Is now at baseline with holding of the diuretics. Continue to hold   HLD -Not within NCEP guidelines -Increase Lipitor to 60 mg daily   Plan of care -Have placed social work consult in order to discuss with patient and daughter cut in the services that has occurred, and if there is a way to have them reinstated at previous level.   Code Status: FULL Family Communication: no family present at time of exam Disposition Plan: Resolution CAP  Consultants: None   Procedure/Significant Events: 03/21/13 echocardiogram;- Left ventricle: severe LVH. -LVEF= 55% to 60%.  -(grade 1 diastolic dysfunction). - Left atrium: moderately dilated. - Right ventricle:  mildly dilated. - Right atrium: mildly dilated. - Pulmonary arteries: PA peak pressure: 34mm Hg (S).   Culture 2/10 blood  right antecubital/wrist NGTD 2/10 MRSA by PCR negative 2/10 sputum reincubated 2/11 respiratory virus panel pending   Antibiotics: Ceftriaxone  2/12>> Azithromycin 2/12>>   DVT prophylaxis: Lovenox   Devices Chronic trach   LINES / TUBES:     Continuous Infusions: . sodium chloride 50 mL/hr (04/05/14 0825)    Objective: VITAL SIGNS: Temp: 98 F (36.7 C) (02/13 1134) Temp Source: Oral (02/13 1134) BP: 149/59 mmHg (02/13 1134) Pulse Rate: 69 (02/13 1134) SPO2; 95%, 8 L O2 FIO2:30%   Intake/Output Summary (Last 24 hours) at 04/06/14 1449 Last data filed at 04/06/14 1200  Gross per 24 hour  Intake   1518 ml  Output    700 ml  Net    818 ml     Exam: General: A/O 4, NAD, No acute respiratory distress, negative sputum expressed from her tracheostomy  Lungs: negative rhonchi, decreased breath sounds LLL (chronic), continued mild wheezes, no crackles Cardiovascular: Regular rate and rhythm without murmur gallop or rub normal S1 and S2 Abdomen: Nontender, nondistended, soft, bowel sounds positive, no rebound, no ascites, no appreciable mass Extremities: No significant cyanosis, clubbing, or edema bilateral lower extremities  Data Reviewed: Basic Metabolic Panel:  Recent Labs Lab 04/03/14 0759 04/04/14 0513 04/05/14 0904  NA 143 141 140  K 3.7 3.8 4.2  CL 109 111 111  CO2 24 24 22   GLUCOSE 95 106* 99  BUN 44* 42* 38*  CREATININE 4.01* 3.96* 3.47*  CALCIUM 9.5 8.8 8.7  MG  --   --  1.7   Liver Function Tests:  Recent Labs Lab 04/03/14 0759 04/05/14 0904  AST 14 11  ALT 7 7  ALKPHOS 93 81  BILITOT 0.7 0.5  PROT 6.5 5.9*  ALBUMIN 3.4* 2.8*   No results for input(s): LIPASE, AMYLASE in the last 168 hours. No results for input(s): AMMONIA in the last 168 hours. CBC:  Recent Labs Lab 04/03/14 0759 04/04/14 0513 04/05/14 0904  WBC 8.2 7.1 7.5  NEUTROABS 5.7  --  5.7  HGB 9.8* 9.1* 9.2*  HCT 31.2* 29.2* 30.8*  MCV 82.5 84.6 85.3  PLT 189 148* 168   Cardiac Enzymes:  Recent Labs Lab 04/03/14 0759  TROPONINI 0.05*   BNP (last 3 results)  Recent Labs  03/29/14 1411  04/03/14 0759  BNP 254.1* 190.4*    ProBNP (last 3 results)  Recent Labs  05/02/13 1219 07/11/13 1208  PROBNP 1474.0* 1111.0*    CBG:  Recent Labs Lab 04/04/14 0313  GLUCAP 95    Recent Results (from the past 240 hour(s))  Culture, respiratory (NON-Expectorated)     Status: None (Preliminary result)   Collection Time: 04/03/14  8:10 AM  Result Value Ref Range Status   Specimen Description TRACHEAL ASPIRATE  Final   Special Requests NONE  Final   Gram Stain   Final    FEW WBC PRESENT, PREDOMINANTLY PMN FEW SQUAMOUS EPITHELIAL CELLS PRESENT RARE GRAM NEGATIVE RODS RARE GRAM POSITIVE COCCI IN PAIRS IN CHAINS    Culture   Final    Culture reincubated for better growth [facility]formed at [facility]    Report Status PENDING  Incomplete  Culture, blood (routine x 2)     Status: None (Preliminary result)   Collection Time: 04/03/14  8:18 AM  Result Value Ref Range Status   Specimen Description BLOOD RIGHT ANTECUBITAL  Final   Special Requests BOTTLES DRAWN AEROBIC AND ANAEROBIC 5CCS  Final   Culture   Final  BLOOD CULTURE RECEIVED NO GROWTH TO DATE CULTURE WILL BE HELD FOR 5 DAYS BEFORE ISSUING A FINAL NEGATIVE REPORT Performed at [facility]    Report Status PENDING  Incomplete  Culture, blood (routine x 2)     Status: None (Preliminary result)   Collection Time: 04/03/14  8:23 AM  Result Value Ref Range Status   Specimen Description BLOOD RIGHT WRIST  Final   Special Requests   Final    BOTTLES DRAWN AEROBIC AND ANAEROBIC BLUE 5CC,RED 4CCS   Culture   Final           BLOOD CULTURE RECEIVED NO GROWTH TO DATE CULTURE WILL BE HELD FOR 5 DAYS BEFORE ISSUING A FINAL NEGATIVE REPORT Performed at [facility]    Report Status PENDING  Incomplete  MRSA PCR Screening     Status: None   Collection Time: 04/03/14  8:42 PM  Result Value Ref Range Status   MRSA by PCR NEGATIVE NEGATIVE Final    Comment:        The GeneXpert MRSA Assay  (FDA approved for NASAL specimens only), is one component of a comprehensive MRSA colonization surveillance program. It is not intended to diagnose MRSA infection nor to guide or monitor treatment for MRSA infections.      Studies:  Recent x-ray studies have been reviewed in detail by the Attending Physician  Scheduled Meds:  Scheduled Meds: . amLODipine  10 mg Oral Daily  . antiseptic oral rinse  7 mL Mouth Rinse BID  . aspirin  81 mg Oral Daily  . atorvastatin  60 mg Oral q1800  . azithromycin  500 mg Oral Daily  . busPIRone  5 mg Oral BID  . carvedilol  6.25 mg Oral BID WC  . cefTRIAXone (ROCEPHIN)  IV  1 g Intravenous Q24H  . cloNIDine  0.1 mg Oral BID  . dextromethorphan-guaiFENesin  1 tablet Oral BID  . enoxaparin (LOVENOX) injection  30 mg Subcutaneous Q24H  . ipratropium-albuterol  3 mL Nebulization QID  . isosorbide mononitrate  30 mg Oral Daily  . metolazone  5 mg Oral Weekly  . multivitamin with minerals  1 tablet Oral Daily  . pantoprazole  40 mg Oral Daily  . sodium chloride  3 mL Intravenous Q12H    Time spent on care of this patient: 40 mins   Drema Dallas [facility]  [facility] Office  [phone] Pager - [phone]  On-Call/Text Page:      [email or web address]      password [email or web address]  If 7PM-7AM, please contact night-coverage [email or web address] Password [email or web address] 04/06/2014, 2:49 PM   LOS: 3 days   Care during the described time interval was provided by me .  I have reviewed this patient's available data, including medical history, events of note, physical examination, radiology studies and test results as part of my evaluation  Carolyne Littles, MD [phone] Pager

## 2014-04-07 ENCOUNTER — Inpatient Hospital Stay (HOSPITAL_COMMUNITY): Payer: Medicare Other

## 2014-04-07 DIAGNOSIS — E785 Hyperlipidemia, unspecified: Secondary | ICD-10-CM | POA: Diagnosis present

## 2014-04-07 LAB — COMPREHENSIVE METABOLIC PANEL
ALBUMIN: 2.5 g/dL — AB (ref 3.5–5.2)
ALT: 7 U/L (ref 0–35)
ANION GAP: 10 (ref 5–15)
AST: 12 U/L (ref 0–37)
Alkaline Phosphatase: 80 U/L (ref 39–117)
BUN: 37 mg/dL — AB (ref 6–23)
CHLORIDE: 108 mmol/L (ref 96–112)
CO2: 19 mmol/L (ref 19–32)
CREATININE: 3.6 mg/dL — AB (ref 0.50–1.10)
Calcium: 8.9 mg/dL (ref 8.4–10.5)
GFR calc Af Amer: 13 mL/min — ABNORMAL LOW (ref >90)
GFR, EST NON AFRICAN AMERICAN: 12 mL/min — AB (ref >90)
Glucose, Bld: 91 mg/dL (ref 70–99)
Potassium: 3.9 mmol/L (ref 3.5–5.1)
SODIUM: 137 mmol/L (ref 135–145)
Total Bilirubin: 0.5 mg/dL (ref 0.3–1.2)
Total Protein: 5.9 g/dL — ABNORMAL LOW (ref 6.0–8.3)

## 2014-04-07 LAB — CBC WITH DIFFERENTIAL/PLATELET
Basophils Absolute: 0 10*3/uL (ref 0.0–0.1)
Basophils Relative: 0 % (ref 0–1)
EOS PCT: 2 % (ref 0–5)
Eosinophils Absolute: 0.2 10*3/uL (ref 0.0–0.7)
HCT: 28.2 % — ABNORMAL LOW (ref 36.0–46.0)
Hemoglobin: 8.8 g/dL — ABNORMAL LOW (ref 12.0–15.0)
LYMPHS PCT: 16 % (ref 12–46)
Lymphs Abs: 1.4 10*3/uL (ref 0.7–4.0)
MCH: 25.7 pg — ABNORMAL LOW (ref 26.0–34.0)
MCHC: 31.2 g/dL (ref 30.0–36.0)
MCV: 82.5 fL (ref 78.0–100.0)
MONO ABS: 0.7 10*3/uL (ref 0.1–1.0)
MONOS PCT: 8 % (ref 3–12)
NEUTROS ABS: 6 10*3/uL (ref 1.7–7.7)
NEUTROS PCT: 74 % (ref 43–77)
Platelets: 163 10*3/uL (ref 150–400)
RBC: 3.42 MIL/uL — AB (ref 3.87–5.11)
RDW: 16.6 % — ABNORMAL HIGH (ref 11.5–15.5)
WBC: 8.2 10*3/uL (ref 4.0–10.5)

## 2014-04-07 LAB — CULTURE, RESPIRATORY W GRAM STAIN

## 2014-04-07 LAB — CULTURE, RESPIRATORY

## 2014-04-07 LAB — MAGNESIUM: Magnesium: 1.8 mg/dL (ref 1.5–2.5)

## 2014-04-07 MED ORDER — ISOSORBIDE MONONITRATE ER 30 MG PO TB24
45.0000 mg | ORAL_TABLET | Freq: Every day | ORAL | Status: DC
  Administered 2014-04-07 – 2014-04-11 (×5): 45 mg via ORAL
  Filled 2014-04-07 (×5): qty 1

## 2014-04-07 NOTE — Progress Notes (Addendum)
[place] TEAM 1 - Stepdown/ICU TEAM Progress Note  Felicia FormMildred W [name] AVW:[id number] DOB: 1940/10/31 DOA: 04/03/2014 PCP: Oneal GroutPANDEY, MAHIMA, MD  Admit HPI / Brief Narrative: Felicia FormMildred W [name] is a 74 y.o.BF PMHx  Chronic respiratory failure (on 2 L O2 via [place]), chronic trach, COPD, OSA/OHS, chronic LLL collapse systolic/diastolic heart failure, RBBB,NSTEMI, pulmonary hypertension stage IV chronic kidney disease, liver cirrhosis?, Noncompliance in terms of treatment management. 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 Emergency and was able to replace it at that time and patient was discharged on by mouth Levaquin.  She returned the emergency room on 2/10 morning after she woke up last night feeling that she could not catch her breath. She has baseline of 2 L oxygen but was found to have oxygen saturations in the 80s and required up to 5 L by trach mask. She was also given albuterol. Lab work was unrevealing. Chest x-ray unrevealing. Patient was noted to have large amount of secretions coming from her trach, which she states been going on for the past 1-2 days. Hospitalists were called for further evaluation and admission.   HPI/Subjective: 2/14 A/O 4, states continued positive productive cough (yellow/whitish) but increased today with increasing WOB/ SOB. Negative CP. States that at home only ambulate from bed to bathroom and at other times is wheelchair-bound. States her dry weight is 170 pounds   Assessment/Plan: Acute on chronic respiratory failure with hypoxia; CAP/Aspiration? -NTS QID -Obtain sputum culture -Obtain respiratory virus panel (did not get collected) -Continue on ceftriaxone + azithromycin; although negative leukocytosis considering patient's underlying  respiratory disease would complete 7 day course of antibiotic -Sodium Medrol 60 mg daily -Mucinex DM BID -DuoNeb QID -2/14 patient with increased WOB/SOB PCXR C/W worsening pneumonia. Aspiration? NPO and consult speech for swallow study   Acute tracheitis -See acute on chronic respiratory failure  COPD exacerbation -See acute on chronic respiratory failure  OSA/OHS on CPAP -CPAP per respiratory  Chronic LLL collapse -Stable  Systolic and diastolic CHF/RBBB -Strict in and out since admission + 3.3 L  -Daily a.m. admission weight= 83.8 kg (184 pounds);  2/13 weight= 85.4  kg (188.2 pounds) -Patient is ~10 bounds above her stated dry weight , patient's acute kidney failure improving with gentle hydration. Not sure patient knows her accurate weight, will continue to hold diuretics  HTN -Amlodipine 10 mg daily -Coreg 6.25 mg BID -Increase Imdur 45 mg daily -Continue Catapres 0.1 mg BID to avoid rebound hypertension  Acute on chronic kidney Failure (baseline Cr= 3.11-3.80) -Patient's kidney function continues to improve   -Is now at baseline While holding of the diuretics. Continue to hold   HLD -Not within NCEP guidelines -Increase Lipitor to 60 mg daily -PCP to recheck lipid panel in 6-8 weeks   Plan of care -Have placed social work consult in order to discuss with patient and daughter cut in the services that has occurred, and if there is a way to have them reinstated at previous level.   Code Status: FULL Family Communication: no family present at time of exam Disposition Plan: Resolution CAP  Consultants: None   Procedure/Significant Events: 03/21/13 echocardiogram;- Left ventricle: severe LVH. -LVEF= 55% to 60%.  -(grade 1 diastolic dysfunction). - Left atrium: moderately dilated. - Right ventricle:  mildly dilated. -  Right atrium: mildly dilated. - Pulmonary arteries: PA peak pressure: 34mm Hg (S). 2/14 PCXR;New mid and lower right lung opacities pneumonia or  asymmetric edema?. -Slightly improved aeration left mid lung. Continued left lower hemi thorax opacities; airspace disease,consolidation and/or atelectasis.    Culture 2/10 blood right antecubital/wrist NGTD 2/10 MRSA by PCR negative 2/10 sputum reincubated 2/11 respiratory virus panel pending   Antibiotics: Ceftriaxone 2/12>> Azithromycin 2/12>>   DVT prophylaxis: Lovenox   Devices Chronic trach   LINES / TUBES:     Continuous Infusions: . sodium chloride 50 mL/hr (04/05/14 0825)    Objective: VITAL SIGNS: Temp: 98.4 F (36.9 C) (02/14 0823) Temp Source: Oral (02/14 0349) BP: 173/79 mmHg (02/14 0823) Pulse Rate: 75 (02/14 0823) SPO2; 95%, 8 L O2 FIO2:35%   Intake/Output Summary (Last 24 hours) at 04/07/14 0909 Last data filed at 04/07/14 0600  Gross per 24 hour  Intake   1088 ml  Output    925 ml  Net    163 ml     Exam: General: A/O 4, NAD, moderate acute respiratory distress, positive copious amount sputum expressed from her tracheostomy  Lungs: tachypnea, diffuse positive rhonchi, positive productive sputum copious (clear), increased WOB, negative wheezes, no crackles Cardiovascular: Regular rate and rhythm without murmur gallop or rub normal S1 and S2 Abdomen: Nontender, nondistended, soft, bowel sounds positive, no rebound, no ascites, no appreciable mass Extremities: No significant cyanosis, clubbing, or edema bilateral lower extremities  Data Reviewed: Basic Metabolic Panel:  Recent Labs Lab 04/03/14 0759 04/04/14 0513 04/05/14 0904  NA 143 141 140  K 3.7 3.8 4.2  CL 109 111 111  CO2 GLUCOSE 95 106* 99  BUN 44* 42* 38*  CREATININE 4.01* 3.96* 3.47*  CALCIUM 9.5 8.8 8.7  MG  --   --  1.7   Liver Function Tests:  Recent Labs Lab 04/03/14 0759 04/05/14 0904  AST 14 11  ALT 7 7  ALKPHOS 93 81  BILITOT 0.7 0.5  PROT 6.5 5.9*  ALBUMIN 3.4* 2.8*   No results for input(s): LIPASE, AMYLASE in the last 168 hours. No  results for input(s): AMMONIA in the last 168 hours. CBC:  Recent Labs Lab 04/03/14 0759 04/04/14 0513 04/05/14 0904  WBC 8.2 7.1 7.5  NEUTROABS 5.7  --  5.7  HGB 9.8* 9.1* 9.2*  HCT 31.2* 29.2* 30.8*  MCV 82.5 84.6 85.3  PLT 189 148* 168   Cardiac Enzymes:  Recent Labs Lab 04/03/14 0759  TROPONINI 0.05*   BNP (last 3 results)  Recent Labs  03/29/14 1411 04/03/14 0759  BNP 254.1* 190.4*    ProBNP (last 3 results)  Recent Labs  05/02/13 1219 07/11/13 1208  PROBNP 1474.0* 1111.0*    CBG:  Recent Labs Lab 04/04/14 0313  GLUCAP 95    Recent Results (from the past 240 hour(s))  Culture, respiratory (NON-Expectorated)     Status: None (Preliminary result)   Collection Time: 04/03/14  8:10 AM  Result Value Ref Range Status   Specimen Description TRACHEAL ASPIRATE  Final   Special Requests NONE  Final   Gram Stain   Final    FEW WBC PRESENT, PREDOMINANTLY PMN FEW SQUAMOUS EPITHELIAL CELLS PRESENT RARE GRAM NEGATIVE RODS RARE GRAM POSITIVE COCCI IN PAIRS IN CHAINS    Culture   Final    Culture reincubated for better growth Performed at [facility]    Report Status PENDING  Incomplete  Culture, blood (routine x 2)  Status: None (Preliminary result)   Collection Time: 04/03/14  8:18 AM  Result Value Ref Range Status   Specimen Description BLOOD RIGHT ANTECUBITAL  Final   Special Requests BOTTLES DRAWN AEROBIC AND ANAEROBIC 5CCS  Final   Culture   Final           BLOOD CULTURE RECEIVED NO GROWTH TO DATE CULTURE WILL BE HELD FOR 5 DAYS BEFORE ISSUING A FINAL NEGATIVE REPORT Performed at [facility]    Report Status PENDING  Incomplete  Culture, blood (routine x 2)     Status: None (Preliminary result)   Collection Time: 04/03/14  8:23 AM  Result Value Ref Range Status   Specimen Description BLOOD RIGHT WRIST  Final   Special Requests   Final    BOTTLES DRAWN AEROBIC AND ANAEROBIC BLUE 5CC,RED 4CCS   Culture   Final            BLOOD CULTURE RECEIVED NO GROWTH TO DATE CULTURE WILL BE HELD FOR 5 DAYS BEFORE ISSUING A FINAL NEGATIVE REPORT Performed at [facility]    Report Status PENDING  Incomplete  MRSA PCR Screening     Status: None   Collection Time: 04/03/14  8:42 PM  Result Value Ref Range Status   MRSA by PCR NEGATIVE NEGATIVE Final    Comment:        The GeneXpert MRSA Assay (FDA approved for NASAL specimens only), is one component of a comprehensive MRSA colonization surveillance program. It is not intended to diagnose MRSA infection nor to guide or monitor treatment for MRSA infections.      Studies:  Recent x-ray studies have been reviewed in detail by the Attending Physician  Scheduled Meds:  Scheduled Meds: . amLODipine  10 mg Oral Daily  . antiseptic oral rinse  7 mL Mouth Rinse BID  . aspirin  81 mg Oral Daily  . atorvastatin  60 mg Oral q1800  . azithromycin  500 mg Oral Daily  . busPIRone  5 mg Oral BID  . carvedilol  6.25 mg Oral BID WC  . cefTRIAXone (ROCEPHIN)  IV  1 g Intravenous Q24H  . cloNIDine  0.1 mg Oral BID  . dextromethorphan-guaiFENesin  1 tablet Oral BID  . enoxaparin (LOVENOX) injection  30 mg Subcutaneous Q24H  . ipratropium-albuterol  3 mL Nebulization QID  . isosorbide mononitrate  30 mg Oral Daily  . metolazone  5 mg Oral Weekly  . multivitamin with minerals  1 tablet Oral Daily  . pantoprazole  40 mg Oral Daily  . sodium chloride  3 mL Intravenous Q12H    Time spent on care of this patient: 40 mins   [facility]  [facility] Office  [phone] Pager - [phone]  On-Call/Text Page:      [email or web address]      password [email or web address]  If 7PM-7AM, please contact night-coverage [email or web address] Password [email or web address] 04/07/2014, 9:09 AM   LOS: 4 days   Care during the described time interval was provided by me .  I have reviewed this patient's available data, including medical history, events of note, physical examination, radiology studies  and test results as part of my evaluation  Carolyne Littles, MD [phone] Pager

## 2014-04-08 LAB — COMPREHENSIVE METABOLIC PANEL
ALK PHOS: 73 U/L (ref 39–117)
ALT: 7 U/L (ref 0–35)
AST: 11 U/L (ref 0–37)
Albumin: 2.2 g/dL — ABNORMAL LOW (ref 3.5–5.2)
Anion gap: 3 — ABNORMAL LOW (ref 5–15)
BILIRUBIN TOTAL: 0.5 mg/dL (ref 0.3–1.2)
BUN: 34 mg/dL — ABNORMAL HIGH (ref 6–23)
CHLORIDE: 109 mmol/L (ref 96–112)
CO2: 26 mmol/L (ref 19–32)
Calcium: 8.5 mg/dL (ref 8.4–10.5)
Creatinine, Ser: 3.37 mg/dL — ABNORMAL HIGH (ref 0.50–1.10)
GFR calc Af Amer: 14 mL/min — ABNORMAL LOW (ref >90)
GFR calc non Af Amer: 12 mL/min — ABNORMAL LOW (ref >90)
GLUCOSE: 90 mg/dL (ref 70–99)
POTASSIUM: 4 mmol/L (ref 3.5–5.1)
Sodium: 138 mmol/L (ref 135–145)
Total Protein: 5.4 g/dL — ABNORMAL LOW (ref 6.0–8.3)

## 2014-04-08 LAB — MAGNESIUM: MAGNESIUM: 1.8 mg/dL (ref 1.5–2.5)

## 2014-04-08 MED ORDER — TORSEMIDE 20 MG PO TABS
20.0000 mg | ORAL_TABLET | Freq: Two times a day (BID) | ORAL | Status: DC
  Administered 2014-04-08 – 2014-04-11 (×6): 20 mg via ORAL
  Filled 2014-04-08 (×8): qty 1

## 2014-04-08 NOTE — Evaluation (Signed)
Clinical/Bedside Swallow Evaluation Patient Details  Name: [name] MRN: [id number] Date of Birth: 1940/04/08  Today's Date: 04/08/2014 Time: SLP Start Time (ACUTE ONLY): 1135 SLP Stop Time (ACUTE ONLY): 1148 SLP Time Calculation (min) (ACUTE ONLY): 13 min  Past Medical History:  Past Medical History  Diagnosis Date  . COPD (chronic obstructive pulmonary disease)     on home o2  . CVA (cerebral infarction)     hx  . Hypertensive heart disease with congestive heart failure   . Chronic cor pulmonale   . Chronic kidney disease (CKD), stage IV (severe)   . Hyperlipdemia   . Obesity (BMI 30-39.9)   . OSA (obstructive sleep apnea)     Does not use CPAP  . History of gout 09/07/2011  . Chronic combined systolic and diastolic CHF (congestive heart failure) 7/13    a. With cor pulmonale - EF 45-50%.  . Anemia   . Vascular disease   . Asthma   . Hypertension   . GERD (gastroesophageal reflux disease)   . Arthritis   . RBBB   . Thrombocytopenia   . Acute and chronic respiratory failure     a. Trache placed 11/2011 for recurrent intubation (hypercarbic resp failure), post-intubation vocal cord edema, several issues since that time wih trache. b. On home O2 with trache.  . Obesity, unspecified 08/28/2012  . Obesity hypoventilation syndrome   . Lung collapse     a. Chronic LLL collapse.  . Cardiac arrest     a. Asystolic cardiac arrest 02/2012 in setting of a/c respiratory failure (ischemic w/u has been deferred due to worsening CKD).  . NSTEMI (non-ST elevated myocardial infarction)     a. 06/2012 felt type II - troponin peak 1.45 - ischemic eval has been deferred due to CKD. Started on Plavix.  . Pulmonary HTN     a. mild pulm HTN by [facility] 08/2011, felt due to lung disease. b. PA pressure by echo 06/2012.  Marland Kitchen NSVT (nonsustained ventricular tachycardia)     a. Noted on tele, admission in 06/2012.  Marland Kitchen Cirrhosis     a. Suspected on abd CT 12/2011.   Past Surgical History:   Past Surgical History  Procedure Laterality Date  . Bilateral oophorectomy    . Cardiac catheterization      right heart cath  . Laparoscopic gastrostomy  12/21/2011    Procedure: LAPAROSCOPIC GASTROSTOMY;  Surgeon: Axel Filler, MD;  Location: [facility] OR;  Service: General;  Laterality: N/A;  laparoscopic gastrostomy possible open feeding tube  . Abdominal hysterectomy    . Tracheostomy  11/2011  . Cataracts    . Tracheostomy tube placement  02/18/2012    Procedure: TRACHEOSTOMY;  Surgeon: Serena Colonel, MD;  Location: [facility] OR;  Service: ENT;  Laterality: N/A;  . Tracheostomy revision N/A 09/01/2012    Procedure: TRACHEOSTOMY REVISION;  Surgeon: Serena Colonel, MD;  Location: [facility] OR;  Service: ENT;  Laterality: N/A;  . Right heart catheterization N/A 09/13/2011    Procedure: RIGHT HEART CATH;  Surgeon: Vesta Mixer, MD;  Location: [facility] CATH LAB;  Service: Cardiovascular;  Laterality: N/A;   HPI:  [name] is a 74 y.o.BF PMHx Chronic respiratory failure (on 2 L O2 via [place]), chronic trach, COPD, OSA/OHS, chronic LLL collapse systolic/diastolic heart failure, RBBB,NSTEMI, pulmonary hypertension stage IV chronic kidney disease, liver cirrhosis?, Noncompliance in terms of treatment management. Patient was seen in the emergency room on 2/5 for shortness of breath and at that time noted  to have a possible community-acquired pneumonia as well as a tracheostomy issue where patient had lost her inner cannula several weeks ago and the replacement did not fit. Emergency and was able to replace it at that time and patient was discharged on by mouth Levaquin. She returned the emergency room on 2/10 morning after she woke up last night feeling that she could not catch her breath. She has baseline of 2 L oxygen but was found to have oxygen saturations in the 80s and required up to 5 L by trach mask. Initial chest x-ray unrevealing, f/u shows New mid and lower right lung opacities - question pneumonia or  asymmetric edema.. Patient was noted to have large amount of secretions coming from her trach, which she states been going on for the past 1-2 days. Last MBS 11/13 reports: Pt presents with a mild pharyngeal dysphagia marked by consistent, trace penetration of thin and nectar-thick liquids into the laryngeal vestibule (secondary to delayed swallow initiation.) Even with large, consecutive liquid boluses, no aspiration was observed. Swallow function remained consistent with and without use of PMSV.   Assessment / Plan / Recommendation Clinical Impression  Pt demonstrates stable swallow function with no overt signs of aspiration. That being said, pt is at high risk of aspiration due to noncompliance with any recommendations. PMSV eval not completed given that pt will not even wear inner cannula and uses finger occlusion for voice. She has PMSV at home that she does not wear. She does not sit upright for meals despite recommendations. SLP reiterated recommendations, rationale and risks to her behavior with no real response from pt. Recommend pt resume regular diet and thin liquids with at least upright posture when eating/drinking. Pt aware of risks of her behavior. No SLP f/u needed.     Aspiration Risk  Severe    Diet Recommendation Regular;Thin liquid   Liquid Administration via: Cup;Straw Medication Administration: Whole meds with liquid Supervision: Patient able to self feed Postural Changes and/or Swallow Maneuvers: Seated upright 90 degrees;Out of bed for meals    Other  Recommendations     Follow Up Recommendations  None    Frequency and Duration        Pertinent Vitals/Pain NA    SLP Swallow Goals     Swallow Study Prior Functional Status  Type of Home: House Available Help at Discharge: Family;Personal care attendant;Available PRN/intermittently    General HPI: [name] is a 74 y.o.BF PMHx Chronic respiratory failure (on 2 L O2 via [place]), chronic trach, COPD,  OSA/OHS, chronic LLL collapse systolic/diastolic heart failure, RBBB,NSTEMI, pulmonary hypertension stage IV chronic kidney disease, liver cirrhosis?, Noncompliance in terms of treatment management. 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 Emergency and was able to replace it at that time and patient was discharged on by mouth Levaquin. She returned the emergency room on 2/10 morning after she woke up last night feeling that she could not catch her breath. She has baseline of 2 L oxygen but was found to have oxygen saturations in the 80s and required up to 5 L by trach mask. Initial chest x-ray unrevealing, f/u shows New mid and lower right lung opacities - question pneumonia or asymmetric edema.. Patient was noted to have large amount of secretions coming from  her trach, which she states been going on for the past 1-2 days. Last MBS 11/13 reports: Pt presents with a mild pharyngeal dysphagia marked by consistent, trace penetration of thin and nectar-thick liquids into the laryngeal vestibule (secondary to delayed swallow initiation.) Even with large, consecutive liquid boluses, no aspiration was observed. Swallow function remained consistent with and without use of PMSV. Type of Study: Bedside swallow evaluation Previous Swallow Assessment: see HPI Diet Prior to this Study: NPO Temperature Spikes Noted: No Respiratory Status: Room air Trach Size and Type: #4;With PMSV not in place;Uncuffed;Other (Comment) (no inner cannula) History of Recent Intubation: No Behavior/Cognition: Alert Oral Cavity - Dentition: Edentulous Self-Feeding Abilities: Able to feed self Patient Positioning: Upright in bed Baseline Vocal Quality: Other (comment) (clear with finger occlusion) Volitional Cough:  Weak Volitional Swallow: Able to elicit    Oral/Motor/Sensory Function Overall Oral Motor/Sensory Function: Appears within functional limits for tasks assessed   Ice Chips     Thin Liquid Thin Liquid: Within functional limits Presentation: Cup;Straw;Self Fed    Nectar Thick Nectar Thick Liquid: Not tested   Honey Thick Honey Thick Liquid: Not tested   Puree Puree: Within functional limits   Solid   GO    Solid: Within functional limits      Supervised and reviewed by [facility] MA CCC-SLP  Raeann Offner, Riley Nearing 04/08/2014,12:22 PM

## 2014-04-08 NOTE — Evaluation (Signed)
Physical Therapy Evaluation Patient Details Name: [name] MRN: [id number] DOB: 1940-08-05 Today's Date: 04/08/2014   History of Present Illness  Patient is a 74 y.o.BF PMHx Chronic respiratory failure (on 2 L O2 via [place]), chronic trach, COPD, OSA/OHS, chronic LLL collapse systolic/diastolic heart failure, RBBB,NSTEMI, pulmonary hypertension stage IV chronic kidney disease, liver cirrhosis, Noncompliance in terms of treatment management. patient presents now with Acute on chronic respiratory failure with hypoxia; CAP.  Clinical Impression  Patient demonstrates deficits in functional mobility as indicated below. Will need continued skilled PT to address deficits and maximize function. Will see as indicated and progress as tolerated.  Patient reluctant for OOB mobility at this time. Patient was able to sit EOB without physical assist, VS:  SpO2 93% and 97 HR, BP elevated 179/90.     Follow Up Recommendations Home health PT;Supervision - Intermittent    Equipment Recommendations  None recommended by PT    Recommendations for Other Services       Precautions / Restrictions Precautions Precautions:  (oxygen) Restrictions Weight Bearing Restrictions: No      Mobility  Bed Mobility Overal bed mobility: Modified Independent             General bed mobility comments: patient able to get up to EOB and return to supine without any physical assist required.   Transfers                 General transfer comment: patient declining OOB mobility at this time.  Ambulation/Gait             General Gait Details: not tested  Stairs            Wheelchair Mobility    Modified Rankin (Stroke Patients Only)       Balance Overall balance assessment: Needs assistance Sitting-balance support: Feet supported Sitting balance-Leahy Scale: Good Sitting balance - Comments: patient able to maintain EOB >5 minutes                                      Pertinent Vitals/Pain Pain Assessment: Faces Faces Pain Scale: Hurts even more Pain Location: neck and throat Pain Descriptors / Indicators: Discomfort;Grimacing Pain Intervention(s): Limited activity within patient's tolerance;Monitored during session;Repositioned    Home Living Family/patient expects to be discharged to:: Private residence Living Arrangements: Children Available Help at Discharge: Family;Personal care attendant;Available PRN/intermittently Type of Home: House Home Access: Ramped entrance     Home Layout: One level Home Equipment: Walker - 2 wheels;Cane - single point;Grab bars - toilet;Grab bars - tub/shower;Wheelchair - manual      Prior Function           Comments: patient states that she can get herself into her wheel chair     Hand Dominance   Dominant Hand: Right    Extremity/Trunk Assessment               Lower Extremity Assessment: Generalized weakness      Cervical / Trunk Assessment:  (increased body habitus)  Communication   Communication: No difficulties  Cognition Arousal/Alertness: Awake/alert Behavior During Therapy: Flat affect Overall Cognitive Status: No family/caregiver present to determine baseline cognitive functioning                      General Comments      Exercises        Assessment/Plan    PT  Assessment Patient needs continued PT services  PT Diagnosis Difficulty walking;Generalized weakness   PT Problem List Decreased strength;Decreased range of motion;Decreased activity tolerance;Decreased balance;Decreased mobility;Cardiopulmonary status limiting activity  PT Treatment Interventions DME instruction;Gait training;Functional mobility training;Therapeutic activities;Therapeutic exercise;Balance training;Patient/family education   PT Goals (Current goals can be found in the Care Plan section) Acute Rehab PT Goals Patient Stated Goal: none stated PT Goal Formulation: With patient Time For  Goal Achievement: 04/22/14 Potential to Achieve Goals: Fair    Frequency Min 3X/week   Barriers to discharge        Co-evaluation               End of Session Equipment Utilized During Treatment: Oxygen (trach collar) Activity Tolerance: Patient limited by fatigue Patient left: in bed;with call bell/phone within reach Nurse Communication: Mobility status         Time: 1191-4782 PT Time Calculation (min) (ACUTE ONLY): 20 min   Charges:   PT Evaluation $Initial PT Evaluation Tier I: 1 Procedure     PT G CodesFabio Asa 05/05/2014, 1:04 PM  Charlotte Crumb, PT DPT  [phone]

## 2014-04-08 NOTE — Progress Notes (Signed)
[facility] TEAM 1 - Stepdown/ICU TEAM Progress Note  [name] ZOX:[id number] DOB: Oct 31, 1940 DOA: 04/03/2014 PCP: Oneal Grout, MD  Admit HPI / Brief Narrative: 74 y.o.F w/ a Hx of Chronic respiratory failure (on 2 L O2 via [place]), chronic trach, COPD, OSA/OHS, chronic LLL collapse, systolic/diastolic heart failure, RBBB, pulmonary hypertension, stage IV chronic kidney disease, liver cirrhosis?, and noncompliance in terms of treatment management who was seen in the emergency room on 75/5 for shortness of breath and at that time noted to have a possible community-acquired pneumonia as well as a tracheostomy issue where patient had lost her inner cannula several weeks prior and the replacement did not fit. Emergency and was able to replace it at that time and patient was discharged on by mouth Levaquin.  She returned the ER on 2/10 after she woke up feeling that she could not catch her breath. She has baseline of 2 L oxygen but was found to have oxygen saturations in the 80s and required up to 5 L by trach mask. She was also given albuterol. Lab work was unrevealing. Chest x-ray unrevealing. Patient was noted to have large amount of secretions coming from her trach, which she stated had been going on for the 1-2 days.  HPI/Subjective: Pt indicates she has no new complaints today.  She is resting comfortably in bed laying entirely flat.    Assessment/Plan:  Acute on chronic respiratory failure with hypoxia - ?acute tracheitis  -NTS QID -Trach aspirate noting staph  -respiratory virus panel pending  -Continue on ceftriaxone + azithromycin; although negative leukocytosis considering patient's underlying respiratory disease would complete 7 day course of antibiotic -Mucinex DM BID -DuoNeb QID -2/14 patient with increased WOB/SOB PCXR C/W worsening pneumonia - suspect pt is chronically aspirating due to positioning (insists on eating and drinking while laying flat in bed - see SLP note) and  NOT a functional deficiency - follow clinically    Acute tracheitis -See acute on chronic respiratory failure  COPD exacerbation -See acute on chronic respiratory failure  OSA/OHS on CPAP -CPAP per respiratory  Chronic LLL collapse -Stable - likely due to bedridden status and recurrent aspiration insults / plugging as discussed above   Systolic and diastolic CHF -since admission + 1.5 L  -admission weight 83.8 kg (184 pounds);  2/13 weight= 85.3  kg  -resume diuretic and follow   HTN -Amlodipine 10 mg daily -Coreg 6.25 mg BID -Increase Imdur 45 mg daily -Continue Catapres 0.1 mg BID to avoid rebound hypertension  Acute on chronic kidney Failure (baseline Cr= 3.11-3.80) -Patient's kidney function is now at baseline - resume diuretic and follow   HLD -Not within NCEP guidelines -Increase Lipitor to 60 mg daily -PCP to recheck lipid panel in 6-8 weeks  Obesity - Body mass index is 32.26 kg/(m^2).  Plan of care -Have placed social work consult in order to discuss with patient and daughter cut in the services that has occurred, and if there is a way to have them reinstated at previous level.  Code Status: FULL Family Communication: no family present at time of exam Disposition Plan: SDU  Consultants: None  Procedure/Significant Events: 03/21/13 echocardiogram;- Left ventricle: severe LVH. -LVEF= 55% to 60%.  -(grade 1 diastolic dysfunction). - Left atrium: moderately dilated. - Right ventricle:  mildly dilated. - Right atrium: mildly dilated. - Pulmonary arteries: PA peak pressure: 34mm Hg (S). 2/14 PCXR;New mid and lower right lung opacities pneumonia or asymmetric edema?. -Slightly improved aeration left mid lung. Continued left  lower hemi thorax opacities; airspace disease,consolidation and/or atelectasis.  Antibiotics: Ceftriaxone 2/12 > Azithromycin 2/12 > 2/16  DVT prophylaxis: Lovenox  Objective: Blood pressure 114/47, pulse 61, temperature 98.8 F  (37.1 C), temperature source Oral, resp. rate 20, height  (1.626 m), weight 85.3 kg (188 lb 0.8 oz), SpO2 96 %.  Intake/Output Summary (Last 24 hours) at 04/08/14 1553 Last data filed at 04/08/14 0815  Gross per 24 hour  Intake      3 ml  Output   1075 ml  Net  -1072 ml   Exam: General: no acute resp distress at rest  Lungs: negative wheezes, no crackles - distant BS th/o all fields  Cardiovascular: Regular rate and rhythm without murmur gallop or rub - distant HS  Abdomen: Nontender, obese, soft, bowel sounds positive, no rebound, no ascites, no appreciable mass Extremities: No significant cyanosis, clubbing;  1+ edema bilateral lower extremities  Data Reviewed: Basic Metabolic Panel:  Recent Labs Lab 04/03/14 0759 04/04/14 0513 04/05/14 0904 04/07/14 0905 04/08/14 0313  NA 143 141 140 137 138  K 3.7 3.8 4.2 3.9 4.0  CL 109 111 111 108 109  CO2 GLUCOSE 95 106* 99 91 90  BUN 44* 42* 38* 37* 34*  CREATININE 4.01* 3.96* 3.47* 3.60* 3.37*  CALCIUM 9.5 8.8 8.7 8.9 8.5  MG  --   --  1.7 1.8 1.8   Liver Function Tests:  Recent Labs Lab 04/03/14 0759 04/05/14 0904 04/07/14 0905 04/08/14 0313  AST ALT ALKPHOS 93 81 80 73  BILITOT 0.7 0.5 0.5 0.5  PROT 6.5 5.9* 5.9* 5.4*  ALBUMIN 3.4* 2.8* 2.5* 2.2*   CBC:  Recent Labs Lab 04/03/14 0759 04/04/14 0513 04/05/14 0904 04/07/14 0905  WBC 8.2 7.1 7.5 8.2  NEUTROABS 5.7  --  5.7 6.0  HGB 9.8* 9.1* 9.2* 8.8*  HCT 31.2* 29.2* 30.8* 28.2*  MCV 82.5 84.6 85.3 82.5  PLT 189 148* 168 163   CBG:  Recent Labs Lab 04/04/14 0313  GLUCAP 95    Recent Results (from the past 240 hour(s))  Culture, respiratory (NON-Expectorated)     Status: None   Collection Time: 04/03/14  8:10 AM  Result Value Ref Range Status   Specimen Description TRACHEAL ASPIRATE  Final   Special Requests NONE  Final   Gram Stain   Final    FEW WBC PRESENT, PREDOMINANTLY PMN FEW SQUAMOUS EPITHELIAL  CELLS PRESENT RARE GRAM NEGATIVE RODS RARE GRAM POSITIVE COCCI IN PAIRS IN CHAINS    Culture   Final    MODERATE STAPHYLOCOCCUS AUREUS Note: RIFAMPIN AND GENTAMICIN SHOULD NOT BE USED AS SINGLE DRUGS FOR TREATMENT OF STAPH INFECTIONS.    Report Status 04/07/2014 FINAL  Final   Organism ID, Bacteria STAPHYLOCOCCUS AUREUS  Final      Susceptibility   Staphylococcus aureus - MIC*    CLINDAMYCIN >=8 RESISTANT Resistant     ERYTHROMYCIN >=8 RESISTANT Resistant     GENTAMICIN <=0.5 SENSITIVE Sensitive     LEVOFLOXACIN >=8 RESISTANT Resistant     OXACILLIN <=0.25 SENSITIVE Sensitive     PENICILLIN <=0.03 SENSITIVE Sensitive     RIFAMPIN <=0.5 SENSITIVE Sensitive     TRIMETH/SULFA <=10 SENSITIVE Sensitive     VANCOMYCIN <=0.5 SENSITIVE Sensitive     TETRACYCLINE <=1 SENSITIVE Sensitive     MOXIFLOXACIN Value in next row Resistant      >=8 RESISTANTPerformed at  [facility]    * MODERATE STAPHYLOCOCCUS AUREUS  Culture, blood (routine x 2)     Status: None (Preliminary result)   Collection Time: 04/03/14  8:18 AM  Result Value Ref Range Status   Specimen Description BLOOD RIGHT ANTECUBITAL  Final   Special Requests BOTTLES DRAWN AEROBIC AND ANAEROBIC 5CCS  Final   Culture   Final           BLOOD CULTURE RECEIVED NO GROWTH TO DATE CULTURE WILL BE HELD FOR 5 DAYS BEFORE ISSUING A FINAL NEGATIVE REPORT Performed at [facility]    Report Status PENDING  Incomplete  Culture, blood (routine x 2)     Status: None (Preliminary result)   Collection Time: 04/03/14  8:23 AM  Result Value Ref Range Status   Specimen Description BLOOD RIGHT WRIST  Final   Special Requests   Final    BOTTLES DRAWN AEROBIC AND ANAEROBIC BLUE 5CC,RED 4CCS   Culture   Final           BLOOD CULTURE RECEIVED NO GROWTH TO DATE CULTURE WILL BE HELD FOR 5 DAYS BEFORE ISSUING A FINAL NEGATIVE REPORT Performed at [facility]    Report Status PENDING  Incomplete  MRSA PCR Screening     Status:  None   Collection Time: 04/03/14  8:42 PM  Result Value Ref Range Status   MRSA by PCR NEGATIVE NEGATIVE Final    Comment:        The GeneXpert MRSA Assay (FDA approved for NASAL specimens only), is one component of a comprehensive MRSA colonization surveillance program. It is not intended to diagnose MRSA infection nor to guide or monitor treatment for MRSA infections.   Culture, respiratory (NON-Expectorated)     Status: None (Preliminary result)   Collection Time: 04/07/14  8:54 AM  Result Value Ref Range Status   Specimen Description TRACHEAL ASPIRATE  Final   Special Requests Normal  Final   Gram Stain   Final    NO WBC SEEN NO SQUAMOUS EPITHELIAL CELLS SEEN FEW GRAM POSITIVE COCCI IN PAIRS Performed at [facility]    Culture   Final    Culture reincubated for better growth Performed at [facility]    Report Status PENDING  Incomplete     Studies:  Recent x-ray studies have been reviewed in detail by the Attending Physician  Scheduled Meds:  Scheduled Meds: . amLODipine  10 mg Oral Daily  . antiseptic oral rinse  7 mL Mouth Rinse BID  . aspirin  81 mg Oral Daily  . atorvastatin  60 mg Oral q1800  . azithromycin  500 mg Oral Daily  . busPIRone  5 mg Oral BID  . carvedilol  6.25 mg Oral BID WC  . cefTRIAXone (ROCEPHIN)  IV  1 g Intravenous Q24H  . cloNIDine  0.1 mg Oral BID  . dextromethorphan-guaiFENesin  1 tablet Oral BID  . enoxaparin (LOVENOX) injection  30 mg Subcutaneous Q24H  . ipratropium-albuterol  3 mL Nebulization QID  . isosorbide mononitrate  45 mg Oral Daily  . metolazone  5 mg Oral Weekly  . multivitamin with minerals  1 tablet Oral Daily  . pantoprazole  40 mg Oral Daily  . sodium chloride  3 mL Intravenous Q12H    Time spent on care of this patient: 35 mins  Lonia Blood, MD [facility] For Consults/Admissions - Flow Manager - [phone] Office  [phone]  Contact MD directly via text page:  [email or web address]      password [email or web address]  04/08/2014, 3:53 PM   LOS: 5 days

## 2014-04-08 NOTE — Care Management Note (Addendum)
    Page 1 of 1   04/10/2014     9:49:18 AM CARE MANAGEMENT NOTE 04/10/2014  Patient:  [name],[name]   Account Number:  [id number]  Date Initiated:  04/05/2014  Documentation initiated by:  Mariyah Upshaw  Subjective/Objective Assessment:   dx tracheitis; lives with dtr; chronic trach, [name]/c bound; home O2 through [facility]    PCP  Mahima Glade LloydPandey     Action/Plan:   Anticipated DC Date:  04/10/2014   Anticipated DC Plan:  HOME/SELF CARE      DC Planning Services  CM consult      HH arranged  HH - 11 Patient Refused      Status of service:  In process, will continue to follow Medicare Important Message given?  YES (If response is "NO", the following Medicare IM given date fields will be blank) Date Medicare IM given:  04/08/2014 Medicare IM given by:  Bryn Perkin Date Additional Medicare IM given:   Additional Medicare IM given by:    Per UR Regulation:  Reviewed for med. necessity/level of care/duration of stay  If discussed at Long Length of Stay Meetings, dates discussed:   04/09/2014   Comments:  04/10/14 0930 Elley Harp RN MSN BSN CCM [facility] pt and [facility] - they are confusing [facility] with SNF.  Attempted to explain difference but both refuse to consider the option.  04/09/14 1340 Jahn Franchini RN MSN BSN CCM Pt satisfies criteria for LTAC [facility]  Discussed with attending who then discussed transfer to [facility] with pt.  Per attending, pt stated she wanted to discharge home - plan is for d/c tomorrow.  Talked with dtr, [name], re [facility] PCS hours, verified that pt has equipment she needs @ home.  Per [name], pt has O2 concentrator, backup tank, trach and suction supplies.  Dtr also verified that pt ambulates to bathroom and kitchen independently.  04/08/14 1421 Chrys Landgrebe RN MSN BSN CCM Pt [place] 54 [place], states she needs more hours.  TC to [facility] to discuss.  They will fax form to Dr Glade LloydPandey for completion and request [facility] make home visit to assess for more services when pt discharges from hospital.  Discussed home health RN and PT - pt refuses, has had multiple referrals for home health services and feels she does not need them again

## 2014-04-09 LAB — RESPIRATORY VIRUS PANEL
Adenovirus: NEGATIVE
INFLUENZA A: NEGATIVE
Influenza B: NEGATIVE
Metapneumovirus: NEGATIVE
Parainfluenza 1: NEGATIVE
Parainfluenza 2: NEGATIVE
Parainfluenza 3: NEGATIVE
RESPIRATORY SYNCYTIAL VIRUS A: NEGATIVE
Respiratory Syncytial Virus B: NEGATIVE
Rhinovirus: NEGATIVE

## 2014-04-09 LAB — CULTURE, RESPIRATORY: SPECIAL REQUESTS: NORMAL

## 2014-04-09 LAB — COMPREHENSIVE METABOLIC PANEL
ALK PHOS: 73 U/L (ref 39–117)
ALT: 7 U/L (ref 0–35)
AST: 10 U/L (ref 0–37)
Albumin: 2.4 g/dL — ABNORMAL LOW (ref 3.5–5.2)
Anion gap: 11 (ref 5–15)
BUN: 37 mg/dL — ABNORMAL HIGH (ref 6–23)
CO2: 19 mmol/L (ref 19–32)
Calcium: 8.6 mg/dL (ref 8.4–10.5)
Chloride: 109 mmol/L (ref 96–112)
Creatinine, Ser: 3.49 mg/dL — ABNORMAL HIGH (ref 0.50–1.10)
GFR, EST AFRICAN AMERICAN: 14 mL/min — AB (ref >90)
GFR, EST NON AFRICAN AMERICAN: 12 mL/min — AB (ref >90)
GLUCOSE: 101 mg/dL — AB (ref 70–99)
POTASSIUM: 4.2 mmol/L (ref 3.5–5.1)
Sodium: 139 mmol/L (ref 135–145)
Total Bilirubin: 0.6 mg/dL (ref 0.3–1.2)
Total Protein: 5.4 g/dL — ABNORMAL LOW (ref 6.0–8.3)

## 2014-04-09 LAB — CULTURE, BLOOD (ROUTINE X 2)
CULTURE: NO GROWTH
CULTURE: NO GROWTH

## 2014-04-09 LAB — CBC
HCT: 26 % — ABNORMAL LOW (ref 36.0–46.0)
Hemoglobin: 8.1 g/dL — ABNORMAL LOW (ref 12.0–15.0)
MCH: 25.5 pg — AB (ref 26.0–34.0)
MCHC: 31.2 g/dL (ref 30.0–36.0)
MCV: 81.8 fL (ref 78.0–100.0)
PLATELETS: 157 10*3/uL (ref 150–400)
RBC: 3.18 MIL/uL — AB (ref 3.87–5.11)
RDW: 16.4 % — ABNORMAL HIGH (ref 11.5–15.5)
WBC: 7 10*3/uL (ref 4.0–10.5)

## 2014-04-09 LAB — CULTURE, RESPIRATORY W GRAM STAIN: Gram Stain: NONE SEEN

## 2014-04-09 MED ORDER — METOPROLOL TARTRATE 1 MG/ML IV SOLN
INTRAVENOUS | Status: AC
  Filled 2014-04-09: qty 5

## 2014-04-09 MED ORDER — CEFUROXIME AXETIL 250 MG PO TABS
250.0000 mg | ORAL_TABLET | Freq: Two times a day (BID) | ORAL | Status: DC
  Administered 2014-04-09 – 2014-04-11 (×4): 250 mg via ORAL
  Filled 2014-04-09 (×6): qty 1

## 2014-04-09 MED ORDER — METOPROLOL TARTRATE 1 MG/ML IV SOLN
5.0000 mg | Freq: Once | INTRAVENOUS | Status: AC
  Administered 2014-04-09: 2.5 mg via INTRAVENOUS

## 2014-04-09 NOTE — Progress Notes (Signed)
[place] TEAM 1 - Stepdown/ICU TEAM Progress Note  [name] ZOX:[id number] DOB: 14-Dec-1940 DOA: 04/03/2014 PCP: Oneal Grout, MD  Admit HPI / Brief Narrative: [name] is a 74 y.o.BF PMHx  Chronic respiratory failure (on 2 L O2 via [place]), chronic trach, COPD, OSA/OHS, chronic LLL collapse systolic/diastolic heart failure, RBBB,NSTEMI, pulmonary hypertension stage IV chronic kidney disease, liver cirrhosis?, Noncompliance in terms of treatment management. 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 Emergency and was able to replace it at that time and patient was discharged on by mouth Levaquin.  She returned the emergency room on 2/10 morning after she woke up last night feeling that she could not catch her breath. She has baseline of 2 L oxygen but was found to have oxygen saturations in the 80s and required up to 5 L by trach mask. She was also given albuterol. Lab work was unrevealing. Chest x-ray unrevealing. Patient was noted to have large amount of secretions coming from her trach, which she states been going on for the past 1-2 days. Hospitalists were called for further evaluation and admission.   HPI/Subjective: 2/16 A/O 4, states she does not wish to be discharged to SNF. Spoke with daughter [name] and concurs that patient should be discharged home to her house.  States her dry weight is 170 pounds   Assessment/Plan: Acute on chronic respiratory failure with hypoxia; CAP/Aspiration? -NTS QID -Trach aspirate noting staph  -Obtain respiratory virus panel (did not get collected) -Continue on ceftriaxone + azithromycin; although negative leukocytosis considering patient's underlying respiratory disease would complete 7 day course of  antibiotic -Mucinex DM BID -DuoNeb QID -2/14 patient with increased WOB/SOB PCXR C/W worsening pneumonia. Aspiration? NPO and consult speech for swallow study -NCM Henrietta speaking to [name] (daughter) to ensure all arrangements in place for a.m. discharge   Acute tracheitis -See acute on chronic respiratory failure -2/16 Patient had mucus plugging this a.m. when into SVT with aggressive suctioning. Reverted back to NSR -Patient with a #4 trach in which is probably too small; would recommend #6 which would decrease mucus plugging. Counseled patient to address issue with her ENT physician as an outpatient.  COPD exacerbation -See acute on chronic respiratory failure  OSA/OHS on CPAP -CPAP per respiratory  Chronic LLL collapse -Stable  Systolic and diastolic CHF/RBBB -Strict in and out since admission + 295.2 L  -Daily a.m. admission weight= 83.8 kg (184 pounds);  2/16 bed weight= 85.7  kg (188.2 pounds) -Torsemide 20 mg BID  HTN -Amlodipine 10 mg daily -Coreg 6.25 mg BID -Increase Imdur 45 mg daily -Continue Catapres 0.1 mg BID to avoid rebound hypertension  Acute on chronic kidney Failure (baseline Cr= 3.11-3.80) -Resumed diuretic watch creatinine level closely  HLD -Not within NCEP guidelines -Continue Lipitor to 60 mg daily -PCP to recheck lipid panel in 6-8 weeks   Plan of care -Have placed social work consult in order to discuss with patient and daughter cut in the services that has occurred, and if there is a way to have them reinstated at previous level.   Code Status: FULL Family Communication: no family present at time of exam Disposition Plan: [facility]  Consultants: None   Procedure/Significant Events: 03/21/13 echocardiogram;- Left ventricle: severe LVH. -LVEF= 55% to 60%.  -(  grade 1 diastolic dysfunction). - Left atrium: moderately dilated. - Right ventricle:  mildly dilated. - Right atrium: mildly dilated. - Pulmonary arteries: PA peak  pressure: 34mm Hg (S). 2/14 PCXR;New mid and lower right lung opacities pneumonia or asymmetric edema?. -Slightly improved aeration left mid lung. Continued left lower hemi thorax opacities; airspace disease,consolidation and/or atelectasis.    Culture 2/10 blood right antecubital/wrist NGTD 2/10 MRSA by PCR negative 2/10 sputum reincubated 2/11 respiratory virus panel pending   Antibiotics: Ceftriaxone 2/12>> stopped 2/16  Azithromycin 2/12>>  Ceftin 2/16>>   DVT prophylaxis: Lovenox   Devices Chronic trach   LINES / TUBES:     Continuous Infusions: . sodium chloride 50 mL/hr (04/05/14 0825)    Objective: VITAL SIGNS: Temp: 98.3 F (36.8 C) (02/16 1748) Temp Source: Oral (02/16 1748) BP: 156/63 mmHg (02/16 1748) Pulse Rate: 62 (02/16 1748) SPO2; 95%, 8 L O2 FIO2:35%   Intake/Output Summary (Last 24 hours) at 04/09/14 1803 Last data filed at 04/09/14 1700  Gross per 24 hour  Intake    395 ml  Output   1976 ml  Net  -1581 ml     Exam: General: A/O 4, NAD, moderate acute respiratory distress, positive copious amount sputum expressed from her tracheostomy  Lungs: clear to auscultation bilateral except for decreased breath sounds bibasilar  Cardiovascular: Regular rate and rhythm without murmur gallop or rub normal S1 and S2 Abdomen: Nontender, nondistended, soft, bowel sounds positive, no rebound, no ascites, no appreciable mass Extremities: No significant cyanosis, clubbing, or edema bilateral lower extremities  Data Reviewed: Basic Metabolic Panel:  Recent Labs Lab 04/04/14 0513 04/05/14 0904 04/07/14 0905 04/08/14 0313 04/09/14 0246  NA 141 140 137 138 139  K 3.8 4.2 3.9 4.0 4.2  CL 111 111 108 109 109  CO2 GLUCOSE 106* 99 91 90 101*  BUN 42* 38* 37* 34* 37*  CREATININE 3.96* 3.47* 3.60* 3.37* 3.49*  CALCIUM 8.8 8.7 8.9 8.5 8.6  MG  --  1.7 1.8 1.8  --    Liver Function Tests:  Recent Labs Lab 04/03/14 0759  04/05/14 0904 04/07/14 0905 04/08/14 0313 04/09/14 0246  AST ALT ALKPHOS 93 81 80 73 73  BILITOT 0.7 0.5 0.5 0.5 0.6  PROT 6.5 5.9* 5.9* 5.4* 5.4*  ALBUMIN 3.4* 2.8* 2.5* 2.2* 2.4*   No results for input(s): LIPASE, AMYLASE in the last 168 hours. No results for input(s): AMMONIA in the last 168 hours. CBC:  Recent Labs Lab 04/03/14 0759 04/04/14 0513 04/05/14 0904 04/07/14 0905 04/09/14 0246  WBC 8.2 7.1 7.5 8.2 7.0  NEUTROABS 5.7  --  5.7 6.0  --   HGB 9.8* 9.1* 9.2* 8.8* 8.1*  HCT 31.2* 29.2* 30.8* 28.2* 26.0*  MCV 82.5 84.6 85.3 82.5 81.8  PLT 189 148* 168 163 157   Cardiac Enzymes:  Recent Labs Lab 04/03/14 0759  TROPONINI 0.05*   BNP (last 3 results)  Recent Labs  03/29/14 1411 04/03/14 0759  BNP 254.1* 190.4*    ProBNP (last 3 results)  Recent Labs  05/02/13 1219 07/11/13 1208  PROBNP 1474.0* 1111.0*    CBG:  Recent Labs Lab 04/04/14 0313  GLUCAP 95    Recent Results (from the past 240 hour(s))  Culture, respiratory (NON-Expectorated)     Status: None   Collection Time: 04/03/14  8:10 AM  Result Value Ref Range Status   Specimen Description  TRACHEAL ASPIRATE  Final   Special Requests NONE  Final   Gram Stain   Final    FEW WBC PRESENT, PREDOMINANTLY PMN FEW SQUAMOUS EPITHELIAL CELLS PRESENT RARE GRAM NEGATIVE RODS RARE GRAM POSITIVE COCCI IN PAIRS IN CHAINS    Culture   Final    MODERATE STAPHYLOCOCCUS AUREUS Note: RIFAMPIN AND GENTAMICIN SHOULD NOT BE USED AS SINGLE DRUGS FOR TREATMENT OF STAPH INFECTIONS.    Report Status 04/07/2014 FINAL  Final   Organism ID, Bacteria STAPHYLOCOCCUS AUREUS  Final      Susceptibility   Staphylococcus aureus - MIC*    CLINDAMYCIN >=8 RESISTANT Resistant     ERYTHROMYCIN >=8 RESISTANT Resistant     GENTAMICIN <=0.5 SENSITIVE Sensitive     LEVOFLOXACIN >=8 RESISTANT Resistant     OXACILLIN <=0.25 SENSITIVE Sensitive     PENICILLIN <=0.03 SENSITIVE Sensitive      RIFAMPIN <=0.5 SENSITIVE Sensitive     TRIMETH/SULFA <=10 SENSITIVE Sensitive     VANCOMYCIN <=0.5 SENSITIVE Sensitive     TETRACYCLINE <=1 SENSITIVE Sensitive     MOXIFLOXACIN Value in next row Resistant      >=8 RESISTANTPerformed at [facility]    * MODERATE STAPHYLOCOCCUS AUREUS  Culture, blood (routine x 2)     Status: None   Collection Time: 04/03/14  8:18 AM  Result Value Ref Range Status   Specimen Description BLOOD RIGHT ANTECUBITAL  Final   Special Requests BOTTLES DRAWN AEROBIC AND ANAEROBIC 5CCS  Final   Culture   Final    NO GROWTH 5 DAYS Performed at [facility]    Report Status 04/09/2014 FINAL  Final  Culture, blood (routine x 2)     Status: None   Collection Time: 04/03/14  8:23 AM  Result Value Ref Range Status   Specimen Description BLOOD RIGHT WRIST  Final   Special Requests   Final    BOTTLES DRAWN AEROBIC AND ANAEROBIC BLUE 5CC,RED 4CCS   Culture   Final    NO GROWTH 5 DAYS Performed at [facility]    Report Status 04/09/2014 FINAL  Final  MRSA PCR Screening     Status: None   Collection Time: 04/03/14  8:42 PM  Result Value Ref Range Status   MRSA by PCR NEGATIVE NEGATIVE Final    Comment:        The GeneXpert MRSA Assay (FDA approved for NASAL specimens only), is one component of a comprehensive MRSA colonization surveillance program. It is not intended to diagnose MRSA infection nor to guide or monitor treatment for MRSA infections.   Culture, respiratory (NON-Expectorated)     Status: None   Collection Time: 04/07/14  8:54 AM  Result Value Ref Range Status   Specimen Description TRACHEAL ASPIRATE  Final   Special Requests Normal  Final   Gram Stain   Final    NO WBC SEEN NO SQUAMOUS EPITHELIAL CELLS SEEN FEW GRAM POSITIVE COCCI IN PAIRS Performed at [facility]    Culture   Final    Non-Pathogenic Oropharyngeal-type Flora Isolated. Performed at [facility]    Report Status 04/09/2014 FINAL   Final     Studies:  Recent x-ray studies have been reviewed in detail by the Attending Physician  Scheduled Meds:  Scheduled Meds: . amLODipine  10 mg Oral Daily  . antiseptic oral rinse  7 mL Mouth Rinse BID  . aspirin  81 mg Oral Daily  . atorvastatin  60 mg Oral q1800  .  busPIRone  5 mg Oral BID  . carvedilol  6.25 mg Oral BID WC  . cefTRIAXone (ROCEPHIN)  IV  1 g Intravenous Q24H  . cloNIDine  0.1 mg Oral BID  . dextromethorphan-guaiFENesin  1 tablet Oral BID  . enoxaparin (LOVENOX) injection  30 mg Subcutaneous Q24H  . ipratropium-albuterol  3 mL Nebulization QID  . isosorbide mononitrate  45 mg Oral Daily  . metolazone  5 mg Oral Weekly  . metoprolol      . multivitamin with minerals  1 tablet Oral Daily  . pantoprazole  40 mg Oral Daily  . sodium chloride  3 mL Intravenous Q12H  . torsemide  20 mg Oral BID    Time spent on care of this patient: 40 mins   Drema DallasWOODS, CURTIS, J , [facility]  [facility] Office  [phone] Pager - [phone]  On-Call/Text Page:      [email or web address]      password [email or web address]  If 7PM-7AM, please contact night-coverage [email or web address] Password [email or web address] 04/09/2014, 6:03 PM   LOS: 6 days   Care during the described time interval was provided by me .  I have reviewed this patient's available data, including medical history, events of note, physical examination, radiology studies and test results as part of my evaluation  Carolyne Littlesurtis Woods, MD [phone] Pager

## 2014-04-09 NOTE — Progress Notes (Signed)
Had just finished suctioning pt and saw that pt was in rapid SVT 150-160's. Dr Joseph ArtWoods notified Lopressor 5mg  IV ordered.  Only 2.5mg  was given. Pt had converted back to NSR at 74-78. O2 sats had dropped to 83% Pt suctioned for small amount thick tan secretions Resp Therapist increased trach collar to 40% just prior to pt going into SVT VSS Will continue to monitor closely.

## 2014-04-10 NOTE — Progress Notes (Signed)
RT Note: Patient continues to refuse to wear her inner cannula. Patient understands the importance of wearing it but states that she does not want to wear it. Patient is stable and is in no apparent distress. Rt will continue to monitor.

## 2014-04-10 NOTE — Progress Notes (Signed)
[facility] TEAM 1 - Stepdown/ICU TEAM Progress Note  [name] ZOX:[id number] DOB: 09/27/1940 DOA: 04/03/2014 PCP: Oneal Grout, MD  Admit HPI / Brief Narrative: [name] is a 74 y.o.BF PMH ofChronic respiratory failure (on 2 L O2 via [place]), chronic trach, COPD, OSA/OHS, chronic LLL collapse, systolic/diastolic heart failure, RBBB,NSTEMI, pulmonary hypertension stage IV chronic kidney disease, liver cirrhosis?, Noncompliance in terms of treatment management. Patient was seen at [facility] on 2/5 for shortness of breath thought secondary to community-acquired pneumonia. Patient relayed that she lost inner trach cannula, and replacement ordered did not fit. Emergency replaced in and she was discharged on oral Levaquin.  She returned to ED on 2/10 with worsening shortness of breath. In ED, she was desatting to 80s on home 2L, and required up to 5L by trach mask. Also, she was noted to have large amount of secretions from tach, which she states was going on for the past 1-2 days. Other lab work unremarkable. She was admitted for further workup.  HPI/Subjective: 2/16 A/O 4, Patient and daughter, [name], were refusing discharge today, stating pt's trach needs to be changed to larger size, and adamant about getting that done inpatient, not outpatient  Assessment/Plan: Acute on chronic respiratory failure with hypoxia; CAP/Aspiration? -Afebrile, no leukocytosis. On  -Pt with episode of inc WOB 2/14, CXR with worsening PNA. SLP- regular, thin liquid.  -Continue ceftriaxone and Azithromycin -Shceduled bronchodilator, NTS QID, and mucolytic PRN -Trach aspirate noting staph. RSV panel negative  Acute tracheitis -Well tolerating NTS. Pt with episode of mucus pluggin on 2/16. Went into SVT but reverted back to NSR.  -Per Dr. Joseph Art rec's- increasing trach size to 6 from 4 could decrease mucus plugging. Pt and daughter refusing to do this outpatient, stating it should be done this  hospitalization  COPD exacerbation -See acute on chronic respiratory failure  OSA/OHS on CPAP -CPAP per respiratory  Chronic LLL collapse -Stable  Systolic and diastolic CHF/RBBB -Appears Euvolemic -2D echo 02/2013- EF 55-60% -strict I and O: -2.4L -daily weight: admission weight= 83.8,  2/17 weight= 84.3 kg -Torsemide 20 mg BID  HTN -Amlodipine 10 mg daily -Coreg 6.25 mg BID -Increase Imdur 45 mg daily -Continue Catapres 0.1 mg BID to avoid rebound hypertension  Acute on chronic kidney Failure -Creat 3.4; baseline 3.11-3.80 -Resumed diuretic watch creatinine level closely  HLD -Continue Lipitor to 60 mg daily -OP lipid panel recheck 6-8 weeks  Code Status: FULL Family Communication: Daughter [name] at bedside Disposition Plan: Pt and Daughter wanting to go home, however, state trach is not right size and should be changed inpatient  Consultants: None   Procedure/Significant Events: 03/21/13 echocardiogram;- Left ventricle: severe LVH. -LVEF= 55% to 60%.  -(grade 1 diastolic dysfunction). - Left atrium: moderately dilated. - Right ventricle:  mildly dilated. - Right atrium: mildly dilated. - Pulmonary arteries: PA peak pressure: 34mm Hg (S). 2/14 PCXR;New mid and lower right lung opacities pneumonia or asymmetric edema?. -Slightly improved aeration left mid lung. Continued left lower hemi thorax opacities; airspace disease,consolidation and/or atelectasis.    Culture 2/10 blood right antecubital/wrist NGTD 2/10 MRSA by PCR negative 2/10 sputum reincubated 2/11 respiratory virus panel pending   Antibiotics: Ceftriaxone 2/12>> stopped 2/16  Azithromycin 2/12>>  Ceftin 2/16>>   DVT prophylaxis: Lovenox   Devices Chronic trach   LINES / TUBES:     Continuous Infusions: . sodium chloride 50 mL/hr (04/05/14 0825)    Objective: VITAL SIGNS: Temp: 98.7 F (37.1 C) (02/17 1157) Temp Source: Oral (  02/17 1157) BP: 150/56 mmHg (02/17 1157) Pulse  Rate: 70 (02/17 1157) SPO2; 95%, 8 L O2 FIO2:35%   Intake/Output Summary (Last 24 hours) at 04/10/14 1257 Last data filed at 04/10/14 1212  Gross per 24 hour  Intake      0 ml  Output   3100 ml  Net  -3100 ml     Exam: General: A/O 4 AA female in NAD, on Tach Lungs: Good air movement bilat.  Cardiovascular: Regular rate and rhythm without murmur gallop or rub normal S1 and S2 Abdomen: Nontender, nondistended, soft, bowel sounds positive, no rebound, no ascites, no appreciable mass Extremities: No significant cyanosis, clubbing, or edema bilateral lower extremities  Data Reviewed: Basic Metabolic Panel:  Recent Labs Lab 04/04/14 0513 04/05/14 0904 04/07/14 0905 04/08/14 0313 04/09/14 0246  NA 141 140 137 138 139  K 3.8 4.2 3.9 4.0 4.2  CL 111 111 108 109 109  CO2 GLUCOSE 106* 99 91 90 101*  BUN 42* 38* 37* 34* 37*  CREATININE 3.96* 3.47* 3.60* 3.37* 3.49*  CALCIUM 8.8 8.7 8.9 8.5 8.6  MG  --  1.7 1.8 1.8  --    Liver Function Tests:  Recent Labs Lab 04/05/14 0904 04/07/14 0905 04/08/14 0313 04/09/14 0246  AST ALT ALKPHOS 81 80 73 73  BILITOT 0.5 0.5 0.5 0.6  PROT 5.9* 5.9* 5.4* 5.4*  ALBUMIN 2.8* 2.5* 2.2* 2.4*   No results for input(s): LIPASE, AMYLASE in the last 168 hours. No results for input(s): AMMONIA in the last 168 hours. CBC:  Recent Labs Lab 04/04/14 0513 04/05/14 0904 04/07/14 0905 04/09/14 0246  WBC 7.1 7.5 8.2 7.0  NEUTROABS  --  5.7 6.0  --   HGB 9.1* 9.2* 8.8* 8.1*  HCT 29.2* 30.8* 28.2* 26.0*  MCV 84.6 85.3 82.5 81.8  PLT 148* 168 163 157   Cardiac Enzymes: No results for input(s): CKTOTAL, CKMB, CKMBINDEX, TROPONINI in the last 168 hours. BNP (last 3 results)  Recent Labs  03/29/14 1411 04/03/14 0759  BNP 254.1* 190.4*    ProBNP (last 3 results)  Recent Labs  05/02/13 1219 07/11/13 1208  PROBNP 1474.0* 1111.0*    CBG:  Recent Labs Lab 04/04/14 0313  GLUCAP 95     Recent Results (from the past 240 hour(s))  Culture, respiratory (NON-Expectorated)     Status: None   Collection Time: 04/03/14  8:10 AM  Result Value Ref Range Status   Specimen Description TRACHEAL ASPIRATE  Final   Special Requests NONE  Final   Gram Stain   Final    FEW WBC PRESENT, PREDOMINANTLY PMN FEW SQUAMOUS EPITHELIAL CELLS PRESENT RARE GRAM NEGATIVE RODS RARE GRAM POSITIVE COCCI IN PAIRS IN CHAINS    Culture   Final    MODERATE STAPHYLOCOCCUS AUREUS Note: RIFAMPIN AND GENTAMICIN SHOULD NOT BE USED AS SINGLE DRUGS FOR TREATMENT OF STAPH INFECTIONS.    Report Status 04/07/2014 FINAL  Final   Organism ID, Bacteria STAPHYLOCOCCUS AUREUS  Final      Susceptibility   Staphylococcus aureus - MIC*    CLINDAMYCIN >=8 RESISTANT Resistant     ERYTHROMYCIN >=8 RESISTANT Resistant     GENTAMICIN <=0.5 SENSITIVE Sensitive     LEVOFLOXACIN >=8 RESISTANT Resistant     OXACILLIN <=0.25 SENSITIVE Sensitive     PENICILLIN <=0.03 SENSITIVE Sensitive     RIFAMPIN <=0.5 SENSITIVE Sensitive     TRIMETH/SULFA <=  10 SENSITIVE Sensitive     VANCOMYCIN <=0.5 SENSITIVE Sensitive     TETRACYCLINE <=1 SENSITIVE Sensitive     MOXIFLOXACIN Value in next row Resistant      >=8 RESISTANTPerformed at [facility]    * MODERATE STAPHYLOCOCCUS AUREUS  Culture, blood (routine x 2)     Status: None   Collection Time: 04/03/14  8:18 AM  Result Value Ref Range Status   Specimen Description BLOOD RIGHT ANTECUBITAL  Final   Special Requests BOTTLES DRAWN AEROBIC AND ANAEROBIC 5CCS  Final   Culture   Final    NO GROWTH 5 DAYS Performed at [facility]    Report Status 04/09/2014 FINAL  Final  Culture, blood (routine x 2)     Status: None   Collection Time: 04/03/14  8:23 AM  Result Value Ref Range Status   Specimen Description BLOOD RIGHT WRIST  Final   Special Requests   Final    BOTTLES DRAWN AEROBIC AND ANAEROBIC BLUE 5CC,RED 4CCS   Culture   Final    NO GROWTH 5  DAYS Performed at [facility]    Report Status 04/09/2014 FINAL  Final  MRSA PCR Screening     Status: None   Collection Time: 04/03/14  8:42 PM  Result Value Ref Range Status   MRSA by PCR NEGATIVE NEGATIVE Final    Comment:        The GeneXpert MRSA Assay (FDA approved for NASAL specimens only), is one component of a comprehensive MRSA colonization surveillance program. It is not intended to diagnose MRSA infection nor to guide or monitor treatment for MRSA infections.   Respiratory virus panel (routine influenza)     Status: None   Collection Time: 04/06/14  5:22 PM  Result Value Ref Range Status   Source - RVPAN NASAL WASHINGS  Corrected   Respiratory Syncytial Virus A Negative Negative Final   Respiratory Syncytial Virus B Negative Negative Final   Influenza A Negative Negative Final   Influenza B Negative Negative Final   Parainfluenza 1 Negative Negative Final   Parainfluenza 2 Negative Negative Final   Parainfluenza 3 Negative Negative Final   Metapneumovirus Negative Negative Final   Rhinovirus Negative Negative Final   Adenovirus Negative Negative Final    Comment: (NOTE) Performed At: [facility] [place] [address] [place], [place] [id number] Mila HomerHancock William F MD NW:[id number]:[phone]   Culture, respiratory (NON-Expectorated)     Status: None   Collection Time: 04/07/14  8:54 AM  Result Value Ref Range Status   Specimen Description TRACHEAL ASPIRATE  Final   Special Requests Normal  Final   Gram Stain   Final    NO WBC SEEN NO SQUAMOUS EPITHELIAL CELLS SEEN FEW GRAM POSITIVE COCCI IN PAIRS Performed at [facility]    Culture   Final    Non-Pathogenic Oropharyngeal-type Flora Isolated. Performed at [facility]    Report Status 04/09/2014 FINAL  Final     Studies:  Recent x-ray studies have been reviewed in detail by the Attending Physician  Scheduled Meds:  Scheduled Meds: . amLODipine  10 mg Oral Daily  . antiseptic  oral rinse  7 mL Mouth Rinse BID  . aspirin  81 mg Oral Daily  . atorvastatin  60 mg Oral q1800  . busPIRone  5 mg Oral BID  . carvedilol  6.25 mg Oral BID WC  . cefUROXime  250 mg Oral BID WC  . cloNIDine  0.1 mg Oral BID  .  dextromethorphan-guaiFENesin  1 tablet Oral BID  . enoxaparin (LOVENOX) injection  30 mg Subcutaneous Q24H  . ipratropium-albuterol  3 mL Nebulization QID  . isosorbide mononitrate  45 mg Oral Daily  . metolazone  5 mg Oral Weekly  . multivitamin with minerals  1 tablet Oral Daily  . pantoprazole  40 mg Oral Daily  . sodium chloride  3 mL Intravenous Q12H  . torsemide  20 mg Oral BID    Time spent on care of this patient: 40 mins   Illa Level , [facility]  [facility] Office  [phone] Pager - [phone]  On-Call/Text Page:      [email or web address]      password [email or web address]  If 7PM-7AM, please contact night-coverage [email or web address] Password [email or web address] 04/10/2014, 12:57 PM   LOS: 7 days

## 2014-04-10 NOTE — Progress Notes (Signed)
04/10/2014 patient refused to wear inner cannula and kept taking it out at 0815.Trach care was completed on patient this morning.  Respiratory was made aware. She also refuse to let Rn remove foley cath. [facility] Center[facility]adine Saylah Ketner

## 2014-04-10 NOTE — Progress Notes (Signed)
RT Note: Patient refuses to wear her inner cannula and has pulled it out per her RN Gloriajean DellNadine. Patient appears stable and RT will continue to monitor.

## 2014-04-10 NOTE — Progress Notes (Signed)
PT Cancellation Note  Patient Details Name: [name] MRN: [id number] DOB: [birth year]   Cancelled Treatment:    Reason Eval/Treat Not Completed: Patient declined, no reason specified, patient declining PT, explained the implications of immobility and risk factors for development of complications such as PNA. Patient still refusing all activity at this time.    Fabio AsaWerner, Luisangel Wainright J 04/10/2014, 3:59 PM Charlotte Crumbevon Toluwani Yadav, PT DPT  [phone]

## 2014-04-10 NOTE — Progress Notes (Signed)
RT Note: Patient had been refusing to wear her inner cannula. RT spoke with her this AM and discussed with her the importance of wearing her inner cannula and patient understood why she needed it. RT placed inner cannula back in trach. Patient is tolerating it well and is in no apparent distress. Rt will continue to monitor.

## 2014-04-10 NOTE — Consult Note (Signed)
Spoke with inpatient RNCM about referral. Spoke with patient and daughter [name] at bedside regarding discharge planning needs.  [facility]  Patient and daughter states they would just like more personal care service hours and patient declines home health follow up and feels she doesn't need anything else.  Daughter states, "I promised my dad that I would take care of her at 'home' and we're doing okay they way we have it."  Patient states she has been doing well and just got sick with pneumonia.  She is hopeful that the doctors can fix her tracheostomy issues before she returns home. Patient verbalizes she is doing her best with what she been taught from [[facility]] in the past. Patient continues to be reluctant with trusting her transportation resources because she refuses to call for the services. No additional needs identified for [facility]. For questions, please call Charlesetta ShanksVictoria Isay Perleberg, RN, BSN, CCM, [facility] Hospital Liaison at [phone].

## 2014-04-11 DIAGNOSIS — O10019 Pre-existing essential hypertension complicating pregnancy, unspecified trimester: Secondary | ICD-10-CM | POA: Insufficient documentation

## 2014-04-11 LAB — BASIC METABOLIC PANEL
ANION GAP: 7 (ref 5–15)
BUN: 37 mg/dL — ABNORMAL HIGH (ref 6–23)
CO2: 24 mmol/L (ref 19–32)
Calcium: 8.5 mg/dL (ref 8.4–10.5)
Chloride: 107 mmol/L (ref 96–112)
Creatinine, Ser: 3.28 mg/dL — ABNORMAL HIGH (ref 0.50–1.10)
GFR calc non Af Amer: 13 mL/min — ABNORMAL LOW (ref >90)
GFR, EST AFRICAN AMERICAN: 15 mL/min — AB (ref >90)
Glucose, Bld: 92 mg/dL (ref 70–99)
POTASSIUM: 4 mmol/L (ref 3.5–5.1)
Sodium: 138 mmol/L (ref 135–145)

## 2014-04-11 LAB — CBC
HCT: 32.4 % — ABNORMAL LOW (ref 36.0–46.0)
HEMOGLOBIN: 8.1 g/dL — AB (ref 12.0–15.0)
MCH: 26.9 pg (ref 26.0–34.0)
MCHC: 25 g/dL — AB (ref 30.0–36.0)
MCV: 107.6 fL — ABNORMAL HIGH (ref 78.0–100.0)
Platelets: 154 10*3/uL (ref 150–400)
RBC: 3.01 MIL/uL — ABNORMAL LOW (ref 3.87–5.11)
RDW: 17.1 % — AB (ref 11.5–15.5)
WBC: 7.9 10*3/uL (ref 4.0–10.5)

## 2014-04-11 LAB — CLOSTRIDIUM DIFFICILE BY PCR: Toxigenic C. Difficile by PCR: NEGATIVE

## 2014-04-11 MED ORDER — TORSEMIDE 20 MG PO TABS: 20.0000 mg | ORAL_TABLET | Freq: Two times a day (BID) | ORAL | Status: AC

## 2014-04-11 MED ORDER — DM-GUAIFENESIN ER 30-600 MG PO TB12: 1.0000 | ORAL_TABLET | Freq: Two times a day (BID) | ORAL | Status: AC

## 2014-04-11 MED ORDER — CEFUROXIME AXETIL 250 MG PO TABS: 250.0000 mg | ORAL_TABLET | Freq: Two times a day (BID) | ORAL | Status: AC

## 2014-04-11 MED ORDER — PANTOPRAZOLE SODIUM 40 MG PO TBEC: 40.0000 mg | DELAYED_RELEASE_TABLET | Freq: Every day | ORAL | Status: AC

## 2014-04-11 MED ORDER — ISOSORBIDE MONONITRATE ER 30 MG PO TB24: 45.0000 mg | ORAL_TABLET | Freq: Every day | ORAL | Status: AC

## 2014-04-11 NOTE — Plan of Care (Signed)
Contacted at approximately 1730 by RN Samson FredericElla concerning dosing of Torsemide by physicians pharmacy [facility] [phone]. Attempted to call back and clarify to follow instructions on discharge summary which state Torsemide 20 mg BID. Unfortunately pharmacy was closed at 1730, and was unable to leave message. Notified RN Samson Fredericlla who will contact family and let them know to take Torsemide 20 mg BID.  Review of MAR shows that upon admission patient reported taking Torsemide 60 mg QAm, and 40 mg QPm

## 2014-04-11 NOTE — Clinical Social Work Note (Signed)
CSW consulted for transportation assistance home.  CSW spoke with patient and patients daughter, Babette Relicammy, at bedside- [name] would like to transport patient after work- anticipated 6:30pm pick up  Patients daughter then asked to speak with the DR- CSW arranged and sat in on meeting.   Daughter expressed her frustration that Dr did not come to [name] with questions concerning the patient- Dr explained that because pt is oriented he is required to discuss pt care directly with the patient- [name] continued to be upset that Dr would speak to the pt about where the pt would like to go at DC and feels as if the Dr provoked an attack by speaking directly with the patient.  [name] is agreeable to taking patient home.  CSW signing off.  Merlyn LotJenna Holoman, LCSWA Clinical Social Worker [phone]
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
## 2014-04-11 NOTE — Progress Notes (Signed)
Pt seen at this time for trach consult.  Pt currently on 28% ATC with #[id number] in place tolerating well.  Per CSW note plan at this time will be to return back home with daughter, pt has chronic trach in place.

## 2014-04-11 NOTE — Progress Notes (Signed)
Pt refuses to wear inner cannula.  Pt being discharged home today.

## 2014-04-11 NOTE — Progress Notes (Addendum)
Pharmacy  called to clarify dose on Demadex. Dr. Joseph ArtWoods notified and given number to pharmacy. Dr Joseph ArtWoods attempted to call [facility] at [phone] the pharmacy is closed and there is no option to leave a message. Called the daughter [name] @ [phone] and made aware of clarification torsemide 20 mg two times per day per Dr. Joseph ArtWoods. Daughter places me on hold and [name] amswersthe phone. Med clarification reviewed with [name]. [name] states that she doesn't take her medicine like that, I again made the patient aware of the discharge instructions and encouraged her to follow up with her primary care physician for any further questions.

## 2014-04-11 NOTE — Progress Notes (Signed)
The patients daughter arrived to take her mother home. I went over the discharge summary and teaching with patient and daughter. Daughter vocalizes understanding. Patient placed on oxygen tank that daughter provided from home and the nurse tech took the patient down in a wheelchair. Lorin PicketLindsey Katrinka Herbison, RN

## 2014-04-15 ENCOUNTER — Encounter (HOSPITAL_COMMUNITY): Payer: Self-pay

## 2014-04-23 ENCOUNTER — Ambulatory Visit: Payer: Medicare Other | Admitting: Internal Medicine

## 2014-05-13 IMAGING — CR DG CHEST 1V PORT
1 series · 1 of 1 positions shown · non-contrast
Comparison: 12/27/2011

CLINICAL DATA: Shortness of breath

PORTABLE CHEST - 1 VIEW

[AP]
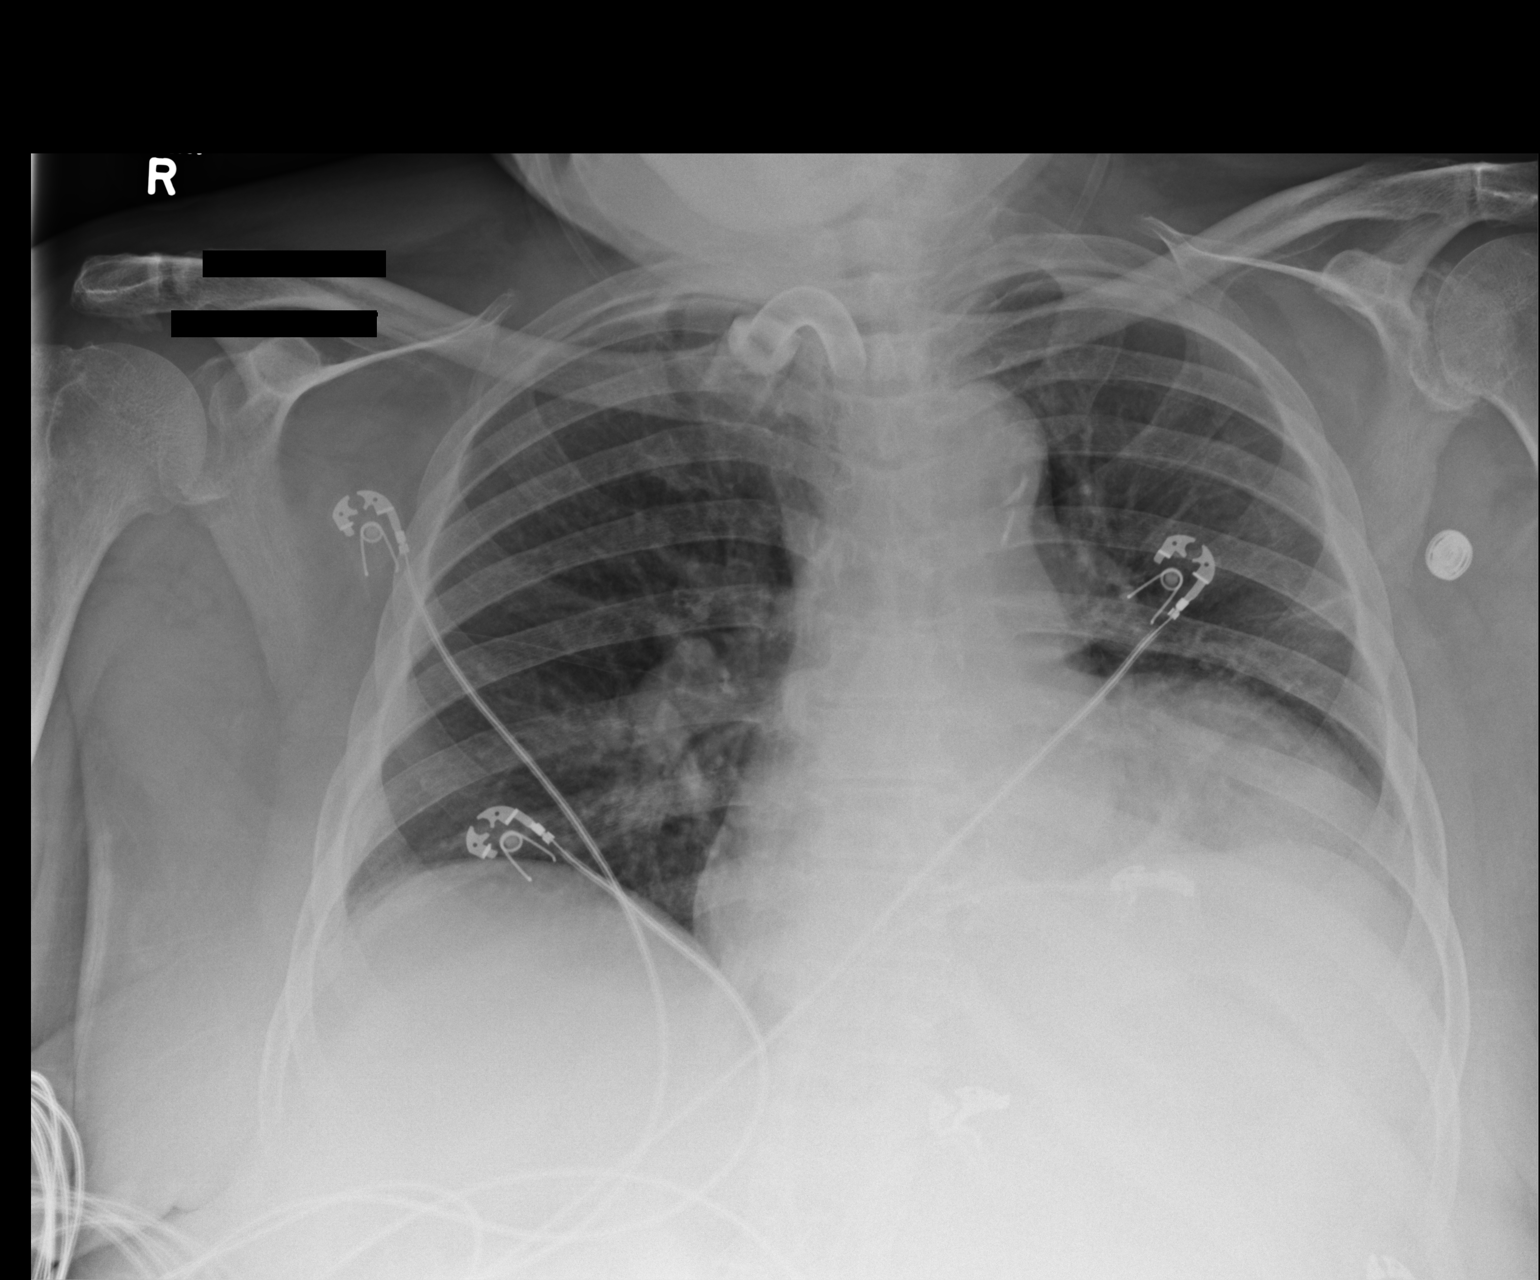

[1 of 1 positions shown; findings below may reference images not displayed]

FINDINGS: Tracheostomy tube tip is in place as above the
carina.Stable cardiac enlargement.  Lung volumes are low.  No
pleural effusion or edema.  Significantly improved aeration to the
left base is noted.  Mild residual plate-like atelectasis is noted.
IMPRESSION: 1.  Improved aeration to the left base.  Mild plate-like
atelectasis remains.
2.  Stable cardiac enlargement.

## 2014-05-14 ENCOUNTER — Encounter: Payer: Medicare Other | Admitting: Internal Medicine

## 2014-05-14 ENCOUNTER — Telehealth: Payer: Self-pay | Admitting: *Deleted

## 2014-05-14 NOTE — Telephone Encounter (Signed)
Howard PouchJermesea Boss with [facility] called and stated that she needed some paperwork signed for an aid. Informed her that patient needs an appointment before we could sign paperwork. She agreed due to it being over 90 days since we seen patient last. She will contact patient to schedule an appointment. Agreed.

## 2014-05-18 ENCOUNTER — Emergency Department (HOSPITAL_COMMUNITY): Payer: Medicare Other

## 2014-05-18 ENCOUNTER — Emergency Department (HOSPITAL_COMMUNITY)
Admission: EM | Admit: 2014-05-18 | Discharge: 2014-05-24 | Disposition: E | Payer: Medicare Other | Attending: Emergency Medicine | Admitting: Emergency Medicine

## 2014-05-18 ENCOUNTER — Encounter (HOSPITAL_COMMUNITY): Payer: Self-pay | Admitting: Emergency Medicine

## 2014-05-18 DIAGNOSIS — Z79899 Other long term (current) drug therapy: Secondary | ICD-10-CM | POA: Diagnosis not present

## 2014-05-18 DIAGNOSIS — E669 Obesity, unspecified: Secondary | ICD-10-CM | POA: Diagnosis not present

## 2014-05-18 DIAGNOSIS — Z7982 Long term (current) use of aspirin: Secondary | ICD-10-CM | POA: Diagnosis not present

## 2014-05-18 DIAGNOSIS — Z9889 Other specified postprocedural states: Secondary | ICD-10-CM | POA: Insufficient documentation

## 2014-05-18 DIAGNOSIS — Z992 Dependence on renal dialysis: Secondary | ICD-10-CM | POA: Insufficient documentation

## 2014-05-18 DIAGNOSIS — I252 Old myocardial infarction: Secondary | ICD-10-CM | POA: Diagnosis not present

## 2014-05-18 DIAGNOSIS — Z9981 Dependence on supplemental oxygen: Secondary | ICD-10-CM | POA: Diagnosis not present

## 2014-05-18 DIAGNOSIS — Z87891 Personal history of nicotine dependence: Secondary | ICD-10-CM | POA: Diagnosis not present

## 2014-05-18 DIAGNOSIS — Z8673 Personal history of transient ischemic attack (TIA), and cerebral infarction without residual deficits: Secondary | ICD-10-CM | POA: Diagnosis not present

## 2014-05-18 DIAGNOSIS — Z862 Personal history of diseases of the blood and blood-forming organs and certain disorders involving the immune mechanism: Secondary | ICD-10-CM | POA: Insufficient documentation

## 2014-05-18 DIAGNOSIS — G4733 Obstructive sleep apnea (adult) (pediatric): Secondary | ICD-10-CM | POA: Diagnosis not present

## 2014-05-18 DIAGNOSIS — I120 Hypertensive chronic kidney disease with stage 5 chronic kidney disease or end stage renal disease: Secondary | ICD-10-CM | POA: Insufficient documentation

## 2014-05-18 DIAGNOSIS — I469 Cardiac arrest, cause unspecified: Secondary | ICD-10-CM | POA: Insufficient documentation

## 2014-05-18 DIAGNOSIS — N186 End stage renal disease: Secondary | ICD-10-CM | POA: Diagnosis not present

## 2014-05-18 DIAGNOSIS — J449 Chronic obstructive pulmonary disease, unspecified: Secondary | ICD-10-CM | POA: Insufficient documentation

## 2014-05-18 DIAGNOSIS — K219 Gastro-esophageal reflux disease without esophagitis: Secondary | ICD-10-CM | POA: Diagnosis not present

## 2014-05-18 LAB — I-STAT ARTERIAL BLOOD GAS, ED
Acid-base deficit: 16 mmol/L — ABNORMAL HIGH (ref 0.0–2.0)
BICARBONATE: 12.4 meq/L — AB (ref 20.0–24.0)
O2 Saturation: 100 %
PH ART: 7.119 — AB (ref 7.350–7.450)
TCO2: 14 mmol/L (ref 0–100)
pCO2 arterial: 38.2 mmHg (ref 35.0–45.0)
pO2, Arterial: 263 mmHg — ABNORMAL HIGH (ref 80.0–100.0)

## 2014-05-18 LAB — COMPREHENSIVE METABOLIC PANEL
ALT: 2198 U/L — AB (ref 0–35)
ANION GAP: 16 — AB (ref 5–15)
AST: 3024 U/L — ABNORMAL HIGH (ref 0–37)
Albumin: 1.4 g/dL — ABNORMAL LOW (ref 3.5–5.2)
Alkaline Phosphatase: 73 U/L (ref 39–117)
BUN: 42 mg/dL — AB (ref 6–23)
CALCIUM: 10.7 mg/dL — AB (ref 8.4–10.5)
CO2: 15 mmol/L — ABNORMAL LOW (ref 19–32)
Chloride: 118 mmol/L — ABNORMAL HIGH (ref 96–112)
Creatinine, Ser: 3.97 mg/dL — ABNORMAL HIGH (ref 0.50–1.10)
GFR calc Af Amer: 12 mL/min — ABNORMAL LOW (ref >90)
GFR calc non Af Amer: 10 mL/min — ABNORMAL LOW (ref >90)
Glucose, Bld: 219 mg/dL — ABNORMAL HIGH (ref 70–99)
Potassium: 4.9 mmol/L (ref 3.5–5.1)
Sodium: 149 mmol/L — ABNORMAL HIGH (ref 135–145)
Total Bilirubin: 0.5 mg/dL (ref 0.3–1.2)
Total Protein: 3.6 g/dL — ABNORMAL LOW (ref 6.0–8.3)

## 2014-05-18 LAB — I-STAT CHEM 8, ED
BUN: 66 mg/dL — ABNORMAL HIGH (ref 6–23)
CREATININE: 4.6 mg/dL — AB (ref 0.50–1.10)
Calcium, Ion: 1.67 mmol/L — ABNORMAL HIGH (ref 1.13–1.30)
Chloride: 118 mmol/L — ABNORMAL HIGH (ref 96–112)
GLUCOSE: 84 mg/dL (ref 70–99)
HCT: 32 % — ABNORMAL LOW (ref 36.0–46.0)
HEMOGLOBIN: 10.9 g/dL — AB (ref 12.0–15.0)
POTASSIUM: 5.7 mmol/L — AB (ref 3.5–5.1)
Sodium: 144 mmol/L (ref 135–145)
TCO2: 13 mmol/L (ref 0–100)

## 2014-05-18 LAB — CBG MONITORING, ED: GLUCOSE-CAPILLARY: 55 mg/dL — AB (ref 70–99)

## 2014-05-18 LAB — I-STAT TROPONIN, ED: Troponin i, poc: 15.84 ng/mL (ref 0.00–0.08)

## 2014-05-18 LAB — LACTATE DEHYDROGENASE: LDH: 6216 U/L — AB (ref 94–250)

## 2014-05-18 MED ORDER — EPINEPHRINE HCL 1 MG/ML IJ SOLN
0.5000 ug/min | INTRAVENOUS | Status: DC
  Administered 2014-05-18: 2 ug/min via INTRAVENOUS
  Filled 2014-05-18: qty 4

## 2014-05-18 MED ORDER — CALCIUM CHLORIDE 10 % IV SOLN
INTRAVENOUS | Status: DC | PRN
  Administered 2014-05-18 (×2): 1 g via INTRAVENOUS
  Administered 2014-05-18: 1000 mg via INTRAVENOUS

## 2014-05-18 MED ORDER — NOREPINEPHRINE BITARTRATE 1 MG/ML IV SOLN
0.0000 ug/min | INTRAVENOUS | Status: DC
  Administered 2014-05-18: 10 ug/min via INTRAVENOUS
  Filled 2014-05-18: qty 4

## 2014-05-18 MED ORDER — EPINEPHRINE HCL 0.1 MG/ML IJ SOSY
PREFILLED_SYRINGE | INTRAMUSCULAR | Status: DC | PRN
  Administered 2014-05-18 (×14): 1 mg via INTRAVENOUS

## 2014-05-18 MED ORDER — SODIUM BICARBONATE 8.4 % IV SOLN
INTRAVENOUS | Status: DC | PRN
  Administered 2014-05-18 (×3): 50 meq via INTRAVENOUS

## 2014-05-18 MED ORDER — DEXTROSE 5 % IV SOLN
300.0000 mg | INTRAVENOUS | Status: DC | PRN
  Administered 2014-05-18: 300 mg via INTRAVENOUS

## 2014-05-18 NOTE — ED Notes (Signed)
Elevated troponin of 15.84 reported to Dr. Rhunette CroftNanavati

## 2014-05-18 NOTE — ED Notes (Signed)
Dopamine increased to 5425mcg/kg.  Dr Rhunette CroftNanavati and the [place] speaking with the family regards to outcome.

## 2014-05-18 NOTE — ED Provider Notes (Signed)
CSN: [id number]     Arrival date & time 05/03/2014  1845 History   First MD Initiated Contact with Patient 05/16/2014 1949     Chief Complaint  Patient presents with  . Cardiac Arrest     (Consider location/radiation/quality/duration/timing/severity/associated sxs/prior Treatment) HPI   This is a 74 year old female past medical history of COPD hypertension, hyperlipidemia, ESRD on dialysis, coronary disease. She comes in as a cardiac arrest CPR in progress.  EMS was called out to the patient's house after the daughter noticed that the patient was altered having trouble breathing.  According to the patient's daughter patient stop breathing just before EMS arrived. When EMS arrived patient was in cardiac arrest initial EKG with EMS showed fine V. fib. Multiple rounds of CPR the way to the hospital with multiple rounds of epinephrine given and defibrillation. Her ROSC was obtained for a very short amount of time per EMS.  Patient rearrested and was in PEA for the remainder of the trip to the hospital.  Past Medical History  Diagnosis Date  . COPD (chronic obstructive pulmonary disease)     on home o2  . CVA (cerebral infarction)     hx  . Hypertensive heart disease with congestive heart failure   . Chronic cor pulmonale   . Chronic kidney disease (CKD), stage IV (severe)   . Hyperlipdemia   . Obesity (BMI 30-39.9)   . OSA (obstructive sleep apnea)     Does not use CPAP  . History of gout 09/07/2011  . Chronic combined systolic and diastolic CHF (congestive heart failure) 7/13    a. With cor pulmonale - EF 45-50%.  . Anemia   . Vascular disease   . Asthma   . Hypertension   . GERD (gastroesophageal reflux disease)   . Arthritis   . RBBB   . Thrombocytopenia   . Acute and chronic respiratory failure     a. Trache placed 11/2011 for recurrent intubation (hypercarbic resp failure), post-intubation vocal cord edema, several issues since that time wih trache. b. On home O2 with trache.  .  Obesity, unspecified 08/28/2012  . Obesity hypoventilation syndrome   . Lung collapse     a. Chronic LLL collapse.  . Cardiac arrest     a. Asystolic cardiac arrest 02/2012 in setting of a/c respiratory failure (ischemic w/u has been deferred due to worsening CKD).  . NSTEMI (non-ST elevated myocardial infarction)     a. 06/2012 felt type II - troponin peak 1.45 - ischemic eval has been deferred due to CKD. Started on Plavix.  . Pulmonary HTN     a. mild pulm HTN by [facility] 08/2011, felt due to lung disease. b. PA pressure by echo 06/2012.  Marland Kitchen NSVT (nonsustained ventricular tachycardia)     a. Noted on tele, admission in 06/2012.  Marland Kitchen Cirrhosis     a. Suspected on abd CT 12/2011.   Past Surgical History  Procedure Laterality Date  . Bilateral oophorectomy    . Cardiac catheterization      right heart cath  . Laparoscopic gastrostomy  12/21/2011    Procedure: LAPAROSCOPIC GASTROSTOMY;  Surgeon: Axel Filler, MD;  Location: [facility] OR;  Service: General;  Laterality: N/A;  laparoscopic gastrostomy possible open feeding tube  . Abdominal hysterectomy    . Tracheostomy  11/2011  . Cataracts    . Tracheostomy tube placement  02/18/2012    Procedure: TRACHEOSTOMY;  Surgeon: Serena Colonel, MD;  Location: [facility] OR;  Service: ENT;  Laterality: N/A;  .  Tracheostomy revision N/A 09/01/2012    Procedure: TRACHEOSTOMY REVISION;  Surgeon: Serena Colonel, MD;  Location: [facility] OR;  Service: ENT;  Laterality: N/A;  . Right heart catheterization N/A 09/13/2011    Procedure: RIGHT HEART CATH;  Surgeon: Vesta Mixer, MD;  Location: [facility] CATH LAB;  Service: Cardiovascular;  Laterality: N/A;   Family History  Problem Relation Age of Onset  . Heart attack Father   . Stroke Mother     CVA  . Coronary artery disease Daughter   . Kidney failure Daughter   . Heart murmur Daughter   . Breast cancer Cousin   . Diabetes Cousin     Mother's side of family    History  Substance Use Topics  . Smoking status: Former Smoker  -- 1.00 packs/day for 55 years    Types: Cigarettes    Quit date: 08/28/2011  . Smokeless tobacco: Never Used  . Alcohol Use: No   OB History    No data available     Review of Systems  Unable to perform ROS: Acuity of condition      Allergies  Other and Sulfonamide derivatives  Home Medications   Prior to Admission medications   Medication Sig Start Date End Date Taking? Authorizing Provider  acetaminophen (TYLENOL) 500 MG tablet Take 500 mg by mouth as needed for mild pain.     Historical Provider, MD  albuterol (PROVENTIL) (2.5 MG/3ML) 0.083% nebulizer solution Take 2.5 mg by nebulization every 6 (six) hours as needed for wheezing.    Historical Provider, MD  amLODipine (NORVASC) 10 MG tablet TAKE 1 TABLET BY MOUTH EVERY DAY **FOLLOW UP WITH MD FOR MORE REFILLS** 01/26/14   Dolores Patty, MD  aspirin 81 MG chewable tablet Chew 1 tablet (81 mg total) by mouth daily. 02/19/12   Simonne Martinet, NP  busPIRone (BUSPAR) 5 MG tablet TAKE 1 TABLET BY MOUTH TWICE DAILY 02/25/14   Dolores Patty, MD  carvedilol (COREG) 6.25 MG tablet Take 1 tablet (6.25 mg total) by mouth 2 (two) times daily with a meal. ** Follow Up With Doctor For Refills ** 01/26/14   Dolores Patty, MD  cefUROXime (CEFTIN) 250 MG tablet Take 1 tablet (250 mg total) by mouth 2 (two) times daily with a meal. 04/11/14   Drema Dallas, MD  cloNIDine (CATAPRES) 0.2 MG tablet TAKE 1/2 TABLET BY MOUTH TWICE DAILY 10/31/13   Dolores Patty, MD  dextromethorphan-guaiFENesin [facility] DM) 30-600 MG per 12 hr tablet Take 1 tablet by mouth 2 (two) times daily. 04/11/14   Drema Dallas, MD  isosorbide mononitrate (IMDUR) 30 MG 24 hr tablet Take 1.5 tablets (45 mg total) by mouth daily. 04/11/14   Drema Dallas, MD  LORazepam (ATIVAN) 0.5 MG tablet Take 1 tablet (0.5 mg total) by mouth every 6 (six) hours as needed for anxiety. 09/14/12   Christiane Ha, MD  metolazone (ZAROXOLYN) 5 MG tablet Take 5 mg by mouth once a  week. Friday.    Historical Provider, MD  Multiple Vitamin (MULTIVITAMIN WITH MINERALS) TABS tablet Take 1 tablet by mouth daily.    Historical Provider, MD  pantoprazole (PROTONIX) 40 MG tablet Take 1 tablet (40 mg total) by mouth daily. 04/11/14   Drema Dallas, MD  torsemide (DEMADEX) 20 MG tablet Take 1 tablet (20 mg total) by mouth 2 (two) times daily. Take 60mg  by mouth in the morning and 40mg  by mouth in the afternoon 04/11/14   [facility]  Leonides SchanzJ Woods, MD  vitamin C (ASCORBIC ACID) 500 MG tablet Take 500 mg by mouth daily.    Historical Provider, MD    Physical exam was limited by patient acuity BP 64/37 mmHg  Pulse 149  Resp 21  SpO2 77% Physical Exam  Constitutional:  Patient on the lucas  HENT:  Head: Normocephalic and atraumatic.  Neck: Normal range of motion. Neck supple.  Cardiovascular:  No pulse  Pulmonary/Chest:  ett through tracheostomy site.  Pt being bagged  Abdominal: Soft. She exhibits no distension. There is no guarding.  Musculoskeletal: She exhibits no edema.  Neurological:  Patient limp, unresponsive.  Skin: No rash noted.  cool  Vitals reviewed.   ED Course  INTUBATION Date/Time: 04/24/2014 11:51 PM Performed by: Silas FloodPROULX, Adelie Croswell Authorized by: Silas FloodPROULX, Arlanda Shiplett Consent: The procedure was performed in an emergent situation. Indications: airway protection Intubation method: video-assisted Patient status: unconscious Tube size: 7.5 mm Number of attempts: 1 Cords visualized: yes Post-procedure assessment: ETCO2 monitor and chest rise Breath sounds: equal Cuff inflated: yes Tube secured with: ETT holder  CENTRAL LINE Date/Time: 04/26/2014 11:52 PM Performed by: Silas FloodPROULX, Teasia Zapf Authorized by: Silas FloodPROULX, Gilford Lardizabal Consent: The procedure was performed in an emergent situation. Indications: vascular access Preparation: skin prepped with 2% chlorhexidine Skin prep agent dried: skin prep agent completely dried prior to procedure Location details: right femoral Catheter size:  7 Fr Pre-procedure: landmarks identified Ultrasound guidance: no Number of attempts: 2 Post-procedure: dressing applied Assessment: blood return through all ports and free fluid flow  ARTERIAL LINE Date/Time: 05/05/2014 11:52 PM Performed by: Silas FloodPROULX, Kylin Genna Authorized by: Silas FloodPROULX, Ilo Beamon Consent: The procedure was performed in an emergent situation. Indications: hemodynamic monitoring Location: right femoral Needle gauge: 20 Seldinger technique: Seldinger technique used Number of attempts: 2 Post-procedure: dressing applied   (including critical care time) Labs Review Labs Reviewed  I-STAT CHEM 8, ED - Abnormal; Notable for the following:    Potassium 5.7 (*)    Chloride 118 (*)    BUN 66 (*)    Creatinine, Ser 4.60 (*)    Calcium, Ion 1.67 (*)    Hemoglobin 10.9 (*)    HCT 32.0 (*)    All other components within normal limits  I-STAT TROPOININ, ED - Abnormal; Notable for the following:    Troponin i, poc 15.84 (*)    All other components within normal limits  I-STAT ARTERIAL BLOOD GAS, ED - Abnormal; Notable for the following:    pH, Arterial 7.119 (*)    pO2, Arterial 263.0 (*)    Bicarbonate 12.4 (*)    Acid-base deficit 16.0 (*)    All other components within normal limits  CBC WITH DIFFERENTIAL/PLATELET  COMPREHENSIVE METABOLIC PANEL  CBC WITH DIFFERENTIAL/PLATELET  COMPREHENSIVE METABOLIC PANEL  LACTATE DEHYDROGENASE  BLOOD GAS, ARTERIAL  I-STAT CG4 LACTIC ACID, ED  I-STAT CHEM 8, ED  I-STAT TROPOININ, ED  CBG MONITORING, ED    Imaging Review No results found.   EKG Interpretation None      MDM   Final diagnoses:  Cardiac arrest    This is a 74 year old female past medical history of COPD hypertension, hyperlipidemia, ESRD on dialysis, coronary disease. She comes in as a cardiac arrest CPR in progress Initial rhythm was fine V. fib. On arrival in our ER patient with CPR in progress.  Exam as above patient motionless, pulseless, CPR in progress. She  has a tracheostomy and EMS placed ET tube through tracheostomy, bagging through this tube.  On arrival continued ACLS please  see nursing documentation for details of all the medications given details of the code. Briefly multiple rounds of ACLS, with intermittent PEA versus asystole. After multiple rounds of ACLS turn of spontaneous circulation was achieved. After return of spontaneous circulation epi drip was started, femoral central line as documented above was placed.  Patient was intubated from above as documented above. EKG was obtained concerning for ST segment depressions in the inferior leads as well as some lateral leads with possible ST segment elevations in the anterior leads.   Basic labs obtained showed elevated K, good glucose.  Calcium was given.    Patient soon thereafter lost pulses again. ACLS resumed, multiple rounds of ACLS please see nursing documentation. Return of spontaneous circulation again achieved cardiology consulted. Her daughter came to bedside review of EKG concerning for possible ST segment elevation MI or other ischemic event as the cause of this patient's cardiac arrest.  Troponin elevated. Plan to take to cath lab if patient becomes clinically stable and off.  Patient rearrested PEA arrest, the decision was made to push thrombolytics given the patient's instability at the time unable to go to the Cath Lab. Thrombolytics administered.  Patient again achieved return of spontaneous circulation, started on dopamine drip in addition to the ephedrine drip. Femoral A-line placed blood pressures in the 60s 70 systolic. Patient gradually trended down back into asystole. Given multiple rounds of CPR with recurrent arrest decision was made to terminate resuscitative efforts.  During this time continuing discussions with the patient's daughter were had, she is in agreement with terminating resuscitative efforts at this time.  Time of death was 8:50 pm  Silas Flood, MD 05/07/2014  1610  Derwood Kaplan, MD 2014-05-25 [id number]  Derwood Kaplan, MD 25-May-2014 [id number]

## 2014-05-18 NOTE — ED Notes (Signed)
Pulse recheck, faint pulse

## 2014-05-18 NOTE — ED Notes (Signed)
Dopamine up 435mcg/kg

## 2014-05-18 NOTE — ED Notes (Signed)
Pulse check, remains in PEA CPR continued

## 2014-05-18 NOTE — Code Documentation (Signed)
Pt intubated through trach.

## 2014-05-18 NOTE — ED Notes (Signed)
CPR stopped, pulse check PEA, CPR continued

## 2014-05-18 NOTE — ED Notes (Signed)
Per ems- pt had unwitnessed arrest with short down time. Upon ems arrival pt with agonal respirations and PEA. Ems administered a total of 6 epi, 2g calcium (ems achieved ROSC or short period after calcium administration), 1 amp bicarb. cbg- wnl. IO to R tibia. Capnography-40. CPR in progress on arrival to ER

## 2014-05-18 NOTE — ED Notes (Signed)
[facility] called.  Spoke with Magda KielBecky Hannold  Patient is not a donor candidate

## 2014-05-18 NOTE — Consult Note (Signed)
CARDIOLOGY ADMISSION/CONSULT NOTE  Assessment and Plan:  *Cardiac arrest likely 2/2 VF arrest from acute ST elevation myocardial infarction  [name] is a 74 year old female with history of hypertension, dyslipidemia, presumed CAD, COPD, cor pulmonale, CK D, morbid obesity was brought to the emergency department after having a witnessed cardiac arrest likely secondary to VF from acute ST elevation myocardial infarction. Patient was resuscitated for 1 hour and 30 minutes by ED staff without any stable return of spontaneous circulation. Reviewing EKG from 7:12 PM, patient likely had acute anterolateral ST elevation infarction. Due to overall poor prognosis from extensive resuscitative measures, family had instead focused on keeping [name] comfortable. We agree with family's decisions to focus on comfort measures at this time.   Chief complaint:  Cardiac arrest   HPI:  [name] is a 74 year old female with history of hypertension, hyperlipidemia, presumed CAD, COPD, cor pulmonale, CK D stage IV, morbid obesity was brought to the emergency department after having a witnessed cardiac arrest. Patient is currently intubated is currently going CPR. History is obtained by talking to the emergency department provider and family. Patient reportedly was at home when she acutely became altered and unresponsive. When EMS arrived, patient was noted to be in fine VF. She received one round of defibrillation. Since then, patient reportedly has received multiple rounds of CPR, amiodarone, epinephrine over one hour and 15 minutes. Cardiology is being consult it to assist with management at this time. Upon my arrival, patient is actively going cardiopulmonary resuscitation. Reviewing her EKGs from 7:12 pm, , patient likely had anterolateral acute myocardial infarction leading to VF arrest. Patient reportedly had some return of spontaneous circulation 3540 minutes ago but since then has had  PEA.   Past Medical History Past Medical History  Diagnosis Date  . COPD (chronic obstructive pulmonary disease)     on home o2  . CVA (cerebral infarction)     hx  . Hypertensive heart disease with congestive heart failure   . Chronic cor pulmonale   . Chronic kidney disease (CKD), stage IV (severe)   . Hyperlipdemia   . Obesity (BMI 30-39.9)   . OSA (obstructive sleep apnea)     Does not use CPAP  . History of gout 09/07/2011  . Chronic combined systolic and diastolic CHF (congestive heart failure) 7/13    a. With cor pulmonale - EF 45-50%.  . Anemia   . Vascular disease   . Asthma   . Hypertension   . GERD (gastroesophageal reflux disease)   . Arthritis   . RBBB   . Thrombocytopenia   . Acute and chronic respiratory failure     a. Trache placed 11/2011 for recurrent intubation (hypercarbic resp failure), post-intubation vocal cord edema, several issues since that time wih trache. b. On home O2 with trache.  . Obesity, unspecified 08/28/2012  . Obesity hypoventilation syndrome   . Lung collapse     a. Chronic LLL collapse.  . Cardiac arrest     a. Asystolic cardiac arrest 02/2012 in setting of a/c respiratory failure (ischemic w/u has been deferred due to worsening CKD).  . NSTEMI (non-ST elevated myocardial infarction)     a. 06/2012 felt type II - troponin peak 1.45 - ischemic eval has been deferred due to CKD. Started on Plavix.  . Pulmonary HTN     a. mild pulm HTN by [facility] 08/2011, felt due to lung disease. b. PA pressure 63mmHg by echo 06/2012.  Marland Kitchen. NSVT (nonsustained ventricular tachycardia)  a. Noted on tele, admission in 06/2012.  Marland Kitchen Cirrhosis     a. Suspected on abd CT 12/2011.    Allergies: Allergies  Allergen Reactions  . Other Itching    "Wool"  . Sulfonamide Derivatives Hives and Rash    Social History History   Social History  . Marital Status: Widowed    Spouse Name: N/A  . Number of Children: 1  . Years of Education: N/A   Occupational History   . retired    Social History Main Topics  . Smoking status: Former Smoker -- 1.00 packs/day for 55 years    Types: Cigarettes    Quit date: 08/28/2011  . Smokeless tobacco: Never Used  . Alcohol Use: No  . Drug Use: No  . Sexual Activity: No   Other Topics Concern  . Not on file   Social History Narrative   Widow,  lives alone   Current/past profession- Management consultant   Exercises 1-2 x per month      Family History Family History  Problem Relation Age of Onset  . Heart attack Father   . Stroke Mother     CVA  . Coronary artery disease Daughter   . Kidney failure Daughter   . Heart murmur Daughter   . Breast cancer Cousin   . Diabetes Cousin     Mother's side of family     Physical Exam Filed Vitals:   05/23/2014 2049  BP:   Pulse:   Resp: 21     Gen: Intubated HEENT: Unable to examine but fixed pupils CV: No cardiac sounds  Labs:  Results for orders placed or performed during the hospital encounter of 05/02/2014 (from the past 24 hour(s))  I-Stat Troponin, ED (not at [facility])     Status: Abnormal   Collection Time: 05/03/2014  6:57 PM  Result Value Ref Range   Troponin i, poc 15.84 (HH) 0.00 - 0.08 ng/mL   Comment NOTIFIED PHYSICIAN    Comment 3          I-Stat Chem 8, ED     Status: Abnormal   Collection Time: 04/25/2014  6:58 PM  Result Value Ref Range   Sodium 144 135 - 145 mmol/L   Potassium 5.7 (H) 3.5 - 5.1 mmol/L   Chloride 118 (H) 96 - 112 mmol/L   BUN 66 (H) 6 - 23 mg/dL   Creatinine, Ser 0.86 (H) 0.50 - 1.10 mg/dL   Glucose, Bld 84 70 - 99 mg/dL   Calcium, Ion 5.78 (H) 1.13 - 1.30 mmol/L   TCO2 13 0 - 100 mmol/L   Hemoglobin 10.9 (L) 12.0 - 15.0 g/dL   HCT 46.9 (L) 62.9 - 52.8 %   Comment NOTIFIED PHYSICIAN   I-Stat arterial blood gas, ED     Status: Abnormal   Collection Time: 05/01/2014  7:49 PM  Result Value Ref Range   pH, Arterial 7.119 (LL) 7.350 - 7.450   pCO2 arterial 38.2 35.0 - 45.0 mmHg   pO2, Arterial 263.0 (H) 80.0 - 100.0 mmHg    Bicarbonate 12.4 (L) 20.0 - 24.0 mEq/L   TCO2 14 0 - 100 mmol/L   O2 Saturation 100.0 %   Acid-base deficit 16.0 (H) 0.0 - 2.0 mmol/L   Collection site RADIAL, ALLEN'S TEST ACCEPTABLE    Drawn by RT    Sample type ARTERIAL    Comment NOTIFIED PHYSICIAN   POC CBG, ED     Status: Abnormal   Collection Time: 04/23/2014  8:05  PM  Result Value Ref Range   Glucose-Capillary 55 (L) 70 - 99 mg/dL  CBC with Differential     Status: Abnormal   Collection Time: 05/24/14  8:48 PM  Result Value Ref Range   WBC 7.7 4.0 - 10.5 K/uL   RBC 2.33 (L) 3.87 - 5.11 MIL/uL   Hemoglobin 5.7 (LL) 12.0 - 15.0 g/dL   HCT 16.1 (L) 09.6 - 04.5 %   MCV 86.7 78.0 - 100.0 fL   MCH 24.5 (L) 26.0 - 34.0 pg   MCHC 28.2 (L) 30.0 - 36.0 g/dL   RDW 40.9 (H) 81.1 - 91.4 %   Platelets PATIENT DISCHARGED OR EXPIRED 150 - 400 K/uL   Neutrophils Relative % 44 43 - 77 %   Neutro Abs 3.4 1.7 - 7.7 K/uL   Lymphocytes Relative 54 (H) 12 - 46 %   Lymphs Abs 4.2 (H) 0.7 - 4.0 K/uL   Monocytes Relative 2 (L) 3 - 12 %   Monocytes Absolute 0.1 0.1 - 1.0 K/uL   Eosinophils Relative 1 0 - 5 %   Eosinophils Absolute 0.0 0.0 - 0.7 K/uL   Basophils Relative 0 0 - 1 %   Basophils Absolute 0.0 0.0 - 0.1 K/uL  Comprehensive metabolic panel     Status: Abnormal (Preliminary result)   Collection Time: 05-24-2014  8:48 PM  Result Value Ref Range   Sodium 149 (H) 135 - 145 mmol/L   Potassium 4.9 3.5 - 5.1 mmol/L   Chloride 118 (H) 96 - 112 mmol/L   CO2 15 (L) 19 - 32 mmol/L   Glucose, Bld 219 (H) 70 - 99 mg/dL   BUN 42 (H) 6 - 23 mg/dL   Creatinine, Ser 7.82 (H) 0.50 - 1.10 mg/dL   Calcium 95.6 (H) 8.4 - 10.5 mg/dL   Total Protein 3.6 (L) 6.0 - 8.3 g/dL   Albumin 1.4 (L) 3.5 - 5.2 g/dL   AST PENDING 0 - 37 U/L   ALT 2198 (H) 0 - 35 U/L   Alkaline Phosphatase 73 39 - 117 U/L   Total Bilirubin 0.5 0.3 - 1.2 mg/dL   GFR calc non Af Amer 10 (L) >90 mL/min   GFR calc Af Amer 12 (L) >90 mL/min   Anion gap 16 (H) 5 - 15

## 2014-05-18 NOTE — ED Notes (Signed)
Pulse check, PEA. CPR resumed

## 2014-05-18 NOTE — ED Notes (Signed)
Pulse check, CPR resumed.  

## 2014-05-18 NOTE — Code Documentation (Signed)
Preparing to place central line

## 2014-05-18 NOTE — Progress Notes (Signed)
ABG ordered for 2045 will not be collected due to code changed to comfort care per MD

## 2014-05-18 NOTE — Progress Notes (Signed)
Patient is chronic trach pt. EMS had removed trach in field and intubated stoma with 7.0 ETT. Upon arrival ETT removed from stoma and pt was intubated orally with 7.5 ETT @24  lip.

## 2014-05-18 NOTE — ED Notes (Signed)
Shock performed once, CPR resumed

## 2014-05-18 NOTE — ED Notes (Signed)
Pulse 78 on the monitor, 36 CO2 monitor

## 2014-05-18 NOTE — ED Notes (Signed)
Family has decided upon comfort care measures

## 2014-05-18 NOTE — Code Documentation (Addendum)
A line being placed

## 2014-05-18 NOTE — ED Notes (Signed)
Pulse check, PEA, CPR resumed  

## 2014-05-18 NOTE — ED Notes (Signed)
Lidocaine 100mg  IV given

## 2014-05-18 NOTE — ED Notes (Signed)
Abnormal Chem-8 reported to Dr. Rhunette CroftNanavati

## 2014-05-18 NOTE — ED Notes (Signed)
Patient prepped and taken to the morgue.

## 2014-05-18 NOTE — ED Notes (Signed)
CPR stopped, pulse check done, PEA, CPR resumed.

## 2014-05-18 NOTE — ED Notes (Signed)
TNK 45mg  given IV, CPR resumed.

## 2014-05-18 NOTE — ED Notes (Signed)
[name] on, patient being bagged

## 2014-05-18 NOTE — ED Notes (Signed)
Pulse check, PEA, CR resumed

## 2014-05-18 NOTE — ED Notes (Signed)
CPR paused and pulse check done, patient in PEA, CPR continued

## 2014-05-18 NOTE — ED Notes (Signed)
Family at the bedside.

## 2014-05-18 NOTE — ED Notes (Signed)
Asystole. CPR resumed

## 2014-05-18 NOTE — ED Notes (Signed)
Dopamine increased to 5115mcg/kg

## 2014-05-19 LAB — CBC WITH DIFFERENTIAL/PLATELET
Basophils Absolute: 0 10*3/uL (ref 0.0–0.1)
Basophils Relative: 0 % (ref 0–1)
EOS ABS: 0 10*3/uL (ref 0.0–0.7)
Eosinophils Relative: 1 % (ref 0–5)
HEMATOCRIT: 20.2 % — AB (ref 36.0–46.0)
Hemoglobin: 5.7 g/dL — CL (ref 12.0–15.0)
LYMPHS ABS: 4.2 10*3/uL — AB (ref 0.7–4.0)
Lymphocytes Relative: 54 % — ABNORMAL HIGH (ref 12–46)
MCH: 24.5 pg — ABNORMAL LOW (ref 26.0–34.0)
MCHC: 28.2 g/dL — ABNORMAL LOW (ref 30.0–36.0)
MCV: 86.7 fL (ref 78.0–100.0)
MONO ABS: 0.1 10*3/uL (ref 0.1–1.0)
Monocytes Relative: 2 % — ABNORMAL LOW (ref 3–12)
Neutro Abs: 3.4 10*3/uL (ref 1.7–7.7)
Neutrophils Relative %: 44 % (ref 43–77)
RBC: 2.33 MIL/uL — ABNORMAL LOW (ref 3.87–5.11)
RDW: 19.1 % — ABNORMAL HIGH (ref 11.5–15.5)
WBC: 7.7 10*3/uL (ref 4.0–10.5)

## 2014-05-24 DEATH — deceased

## 2014-05-29 ENCOUNTER — Encounter (HOSPITAL_COMMUNITY): Payer: Self-pay

## 2014-06-05 IMAGING — CR DG CHEST 1V PORT
1 series · 1 of 1 positions shown · non-contrast
Comparison: CT scan dated 01/04/2012 and chest x-ray dated
01/03/2012

CLINICAL DATA: Atelectasis. Increased difficulty breathing.

PORTABLE CHEST - 1 VIEW

[AP]
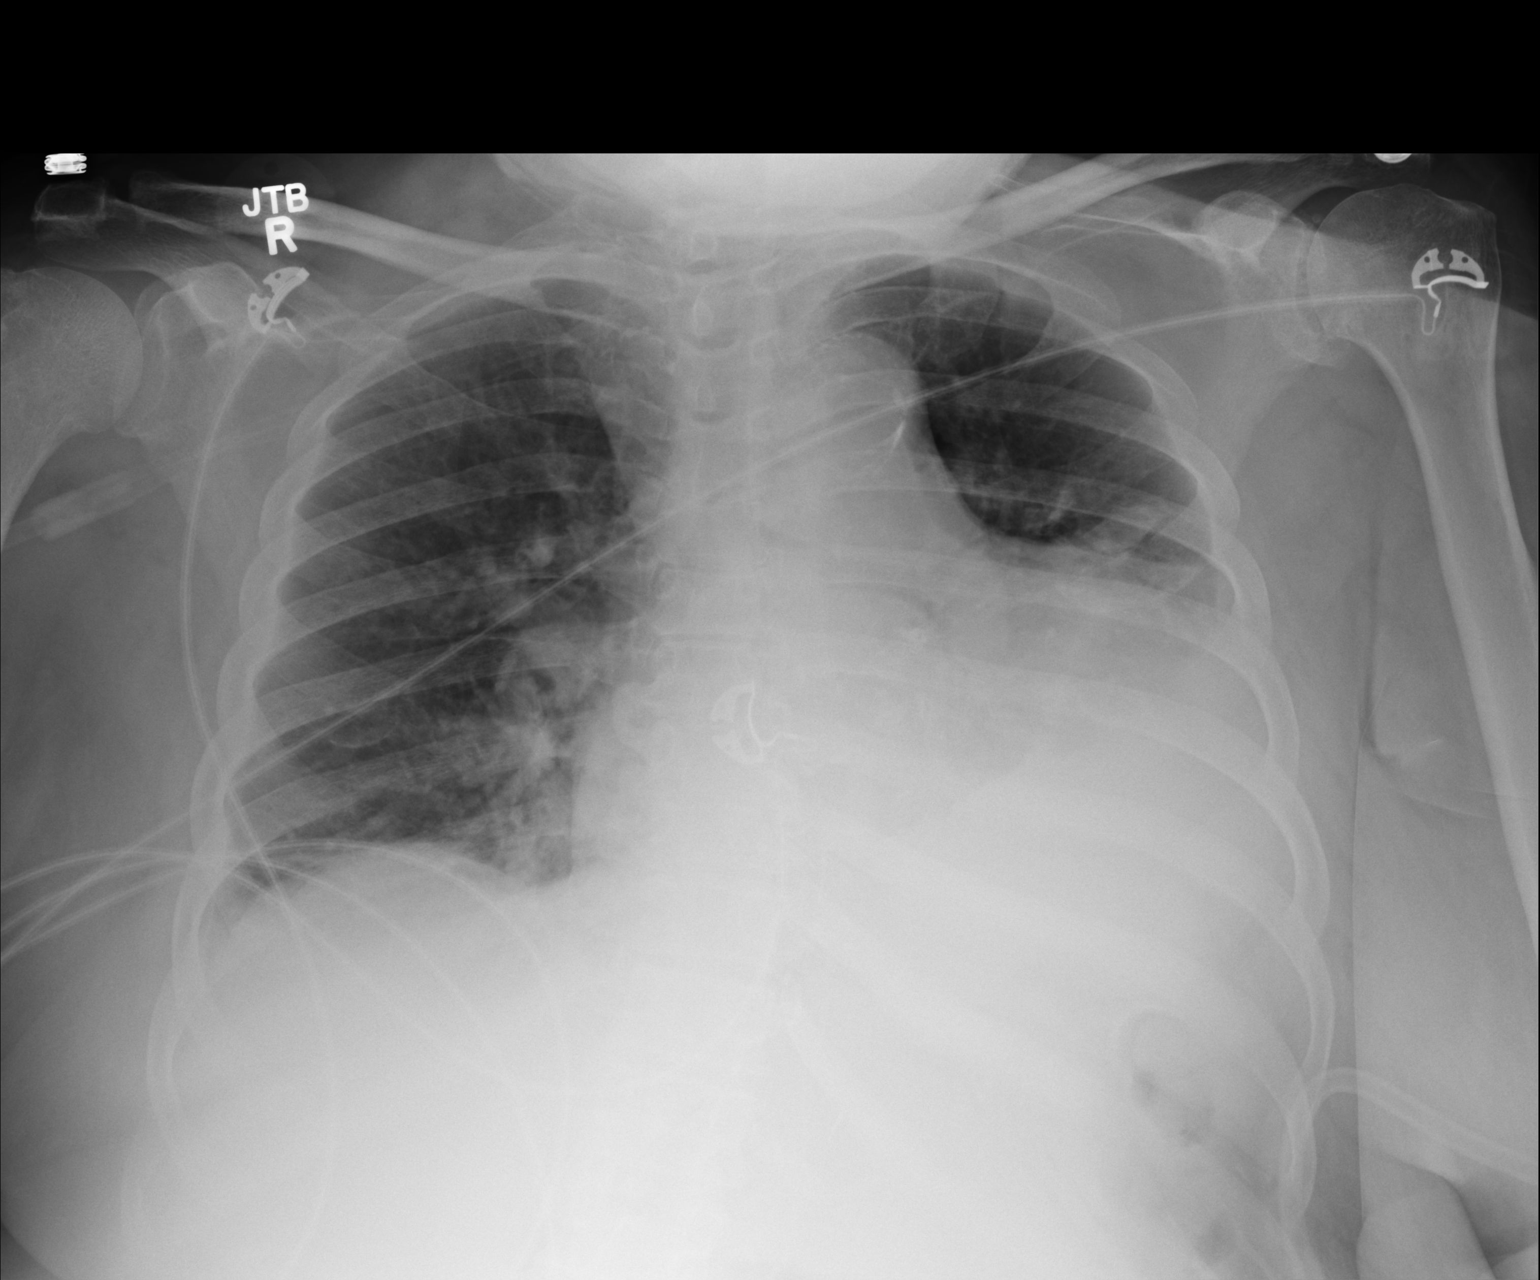

[1 of 1 positions shown; findings below may reference images not displayed]

FINDINGS: There is increased atelectasis and slight consolidation
in the left lung base since the prior exams. New small left
effusion.  There is chronic cardiomegaly.  Pulmonary vascularity is
normal.  Slight atelectasis at the right lung base.

Extensive coronary artery calcification is noted.  Tracheostomy
tube has been removed since the prior exams.
IMPRESSION: Increased atelectasis at the left lung base with slight
consolidation.  Small left effusion.

## 2014-06-06 IMAGING — CR DG CHEST 1V PORT
1 series · 1 of 1 positions shown · non-contrast
Comparison: 01/26/2012.

CLINICAL DATA: Short of breath.  Atelectasis.

PORTABLE CHEST - 1 VIEW

[AP]
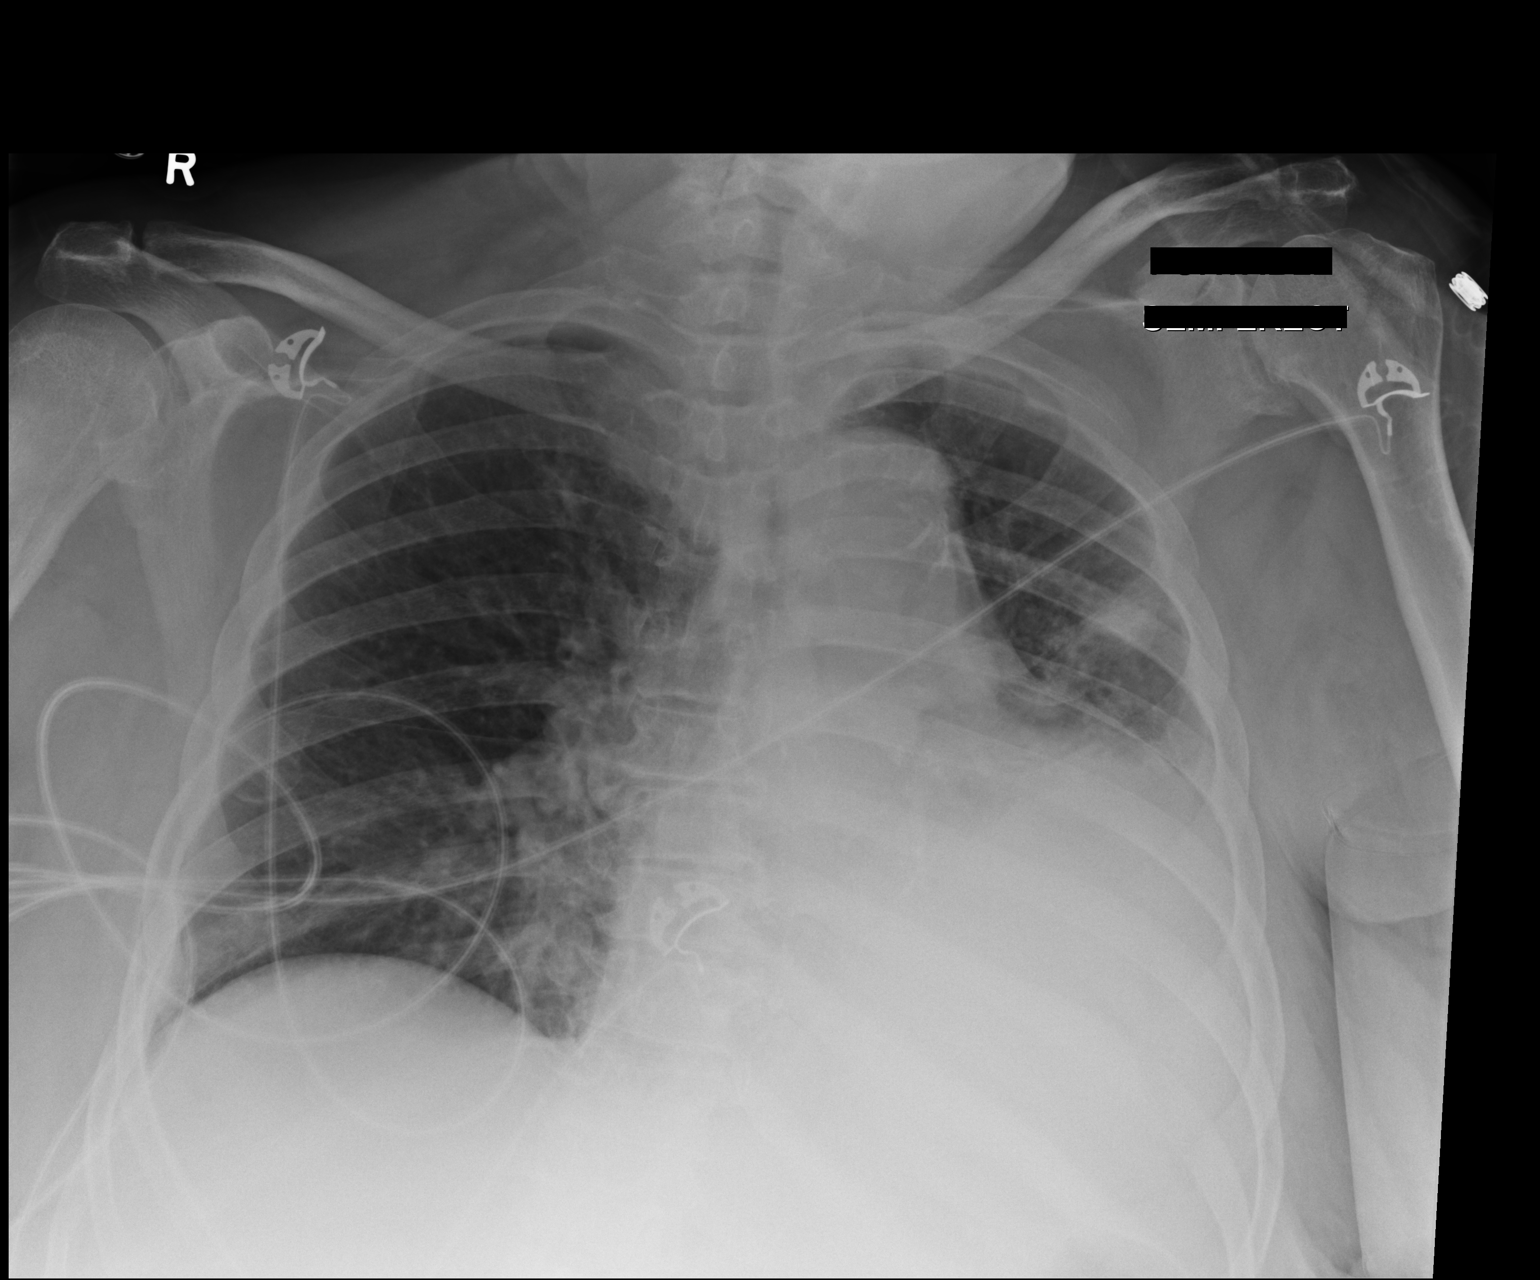

[1 of 1 positions shown; findings below may reference images not displayed]

FINDINGS: Opacification of the inferior left hemithorax.  The
appearance suggests either collapse / consolidation of the left
lower lobe and / or left pleural effusion.  Small focus of airspace
disease in the left midlung.  Chronic elevation of the left
hemidiaphragm.  The right lung is unchanged.  Visualized
cardiopericardial silhouette unchanged. Monitoring leads are
projected over the chest.
IMPRESSION: No interval change. Collapse / consolidation of the left lung base
with small focus of airspace disease in the left midlung.  Probable
moderate left pleural effusion.

## 2022-11-17 ENCOUNTER — Encounter (HOSPITAL_COMMUNITY): Payer: Self-pay | Admitting: *Deleted
